# Patient Record
Sex: Male | Born: 1945
Health system: Southern US, Community
[De-identification: ages and names within clinical notes are randomized; demographics above are authoritative.]

## PROBLEM LIST (undated history)

## (undated) DIAGNOSIS — K2100 Gastro-esophageal reflux disease with esophagitis, without bleeding: Secondary | ICD-10-CM

## (undated) DIAGNOSIS — I25709 Atherosclerosis of coronary artery bypass graft(s), unspecified, with unspecified angina pectoris: Secondary | ICD-10-CM

## (undated) DIAGNOSIS — Z9289 Personal history of other medical treatment: Secondary | ICD-10-CM

## (undated) DIAGNOSIS — M792 Neuralgia and neuritis, unspecified: Secondary | ICD-10-CM

## (undated) DIAGNOSIS — G4730 Sleep apnea, unspecified: Secondary | ICD-10-CM

## (undated) DIAGNOSIS — Z8719 Personal history of other diseases of the digestive system: Secondary | ICD-10-CM

## (undated) DIAGNOSIS — I480 Paroxysmal atrial fibrillation: Secondary | ICD-10-CM

## (undated) DIAGNOSIS — D649 Anemia, unspecified: Secondary | ICD-10-CM

## (undated) DIAGNOSIS — Z973 Presence of spectacles and contact lenses: Secondary | ICD-10-CM

## (undated) DIAGNOSIS — K219 Gastro-esophageal reflux disease without esophagitis: Secondary | ICD-10-CM

## (undated) DIAGNOSIS — J189 Pneumonia, unspecified organism: Secondary | ICD-10-CM

## (undated) DIAGNOSIS — K589 Irritable bowel syndrome without diarrhea: Secondary | ICD-10-CM

## (undated) DIAGNOSIS — I6529 Occlusion and stenosis of unspecified carotid artery: Secondary | ICD-10-CM

## (undated) DIAGNOSIS — R6250 Unspecified lack of expected normal physiological development in childhood: Secondary | ICD-10-CM

## (undated) DIAGNOSIS — I219 Acute myocardial infarction, unspecified: Secondary | ICD-10-CM

## (undated) DIAGNOSIS — I739 Peripheral vascular disease, unspecified: Secondary | ICD-10-CM

## (undated) DIAGNOSIS — N189 Chronic kidney disease, unspecified: Secondary | ICD-10-CM

## (undated) DIAGNOSIS — G629 Polyneuropathy, unspecified: Secondary | ICD-10-CM

## (undated) DIAGNOSIS — I639 Cerebral infarction, unspecified: Secondary | ICD-10-CM

## (undated) DIAGNOSIS — I2510 Atherosclerotic heart disease of native coronary artery without angina pectoris: Secondary | ICD-10-CM

## (undated) DIAGNOSIS — K5792 Diverticulitis of intestine, part unspecified, without perforation or abscess without bleeding: Secondary | ICD-10-CM

## (undated) DIAGNOSIS — M1990 Unspecified osteoarthritis, unspecified site: Secondary | ICD-10-CM

## (undated) DIAGNOSIS — E785 Hyperlipidemia, unspecified: Secondary | ICD-10-CM

## (undated) DIAGNOSIS — I10 Essential (primary) hypertension: Secondary | ICD-10-CM

## (undated) DIAGNOSIS — M810 Age-related osteoporosis without current pathological fracture: Secondary | ICD-10-CM

## (undated) HISTORY — PX: CATARACT EXTRACTION W/ INTRAOCULAR LENS IMPLANT: SHX1309

## (undated) HISTORY — DX: Neuralgia and neuritis, unspecified: M79.2

## (undated) HISTORY — DX: Polyneuropathy, unspecified: G62.9

## (undated) HISTORY — DX: Atherosclerosis of coronary artery bypass graft(s), unspecified, with unspecified angina pectoris: I25.709

## (undated) HISTORY — DX: Personal history of other medical treatment: Z92.89

## (undated) HISTORY — DX: Unspecified osteoarthritis, unspecified site: M19.90

## (undated) HISTORY — DX: Anemia, unspecified: D64.9

## (undated) HISTORY — DX: Paroxysmal atrial fibrillation: I48.0

## (undated) HISTORY — DX: Gastro-esophageal reflux disease with esophagitis, without bleeding: K21.00

## (undated) HISTORY — DX: Acute myocardial infarction, unspecified: I21.9

## (undated) HISTORY — DX: Essential (primary) hypertension: I10

## (undated) HISTORY — DX: Unspecified lack of expected normal physiological development in childhood: R62.50

## (undated) HISTORY — DX: Peripheral vascular disease, unspecified: I73.9

## (undated) HISTORY — DX: Occlusion and stenosis of unspecified carotid artery: I65.29

## (undated) HISTORY — PX: ILIAC ARTERY STENT: SHX1786

## (undated) HISTORY — DX: Cerebral infarction, unspecified: I63.9

## (undated) HISTORY — PX: TONSILLECTOMY: SUR1361

## (undated) HISTORY — DX: Age-related osteoporosis without current pathological fracture: M81.0

## (undated) HISTORY — DX: Hyperlipidemia, unspecified: E78.5

## (undated) HISTORY — PX: COLONOSCOPY W/ BIOPSIES AND POLYPECTOMY: SHX1376

## (undated) HISTORY — DX: Gastro-esophageal reflux disease with esophagitis: K21.0

## (undated) HISTORY — PX: CAROTID ENDARTERECTOMY: SUR193

---

## 1985-08-11 DIAGNOSIS — I639 Cerebral infarction, unspecified: Secondary | ICD-10-CM

## 1985-08-11 HISTORY — DX: Cerebral infarction, unspecified: I63.9

## 1996-08-11 HISTORY — PX: PR VEIN BYPASS GRAFT,AORTO-FEM-POP: 35551

## 1999-07-12 ENCOUNTER — Emergency Department (HOSPITAL_COMMUNITY): Admission: EM | Admit: 1999-07-12 | Discharge: 1999-07-13 | Payer: Self-pay | Admitting: Internal Medicine

## 2000-02-29 ENCOUNTER — Emergency Department (HOSPITAL_COMMUNITY): Admission: EM | Admit: 2000-02-29 | Discharge: 2000-02-29 | Payer: Self-pay | Admitting: *Deleted

## 2000-02-29 ENCOUNTER — Encounter: Payer: Self-pay | Admitting: *Deleted

## 2000-03-08 ENCOUNTER — Encounter (INDEPENDENT_AMBULATORY_CARE_PROVIDER_SITE_OTHER): Payer: Self-pay

## 2000-03-08 ENCOUNTER — Encounter: Payer: Self-pay | Admitting: Emergency Medicine

## 2000-03-09 ENCOUNTER — Encounter: Payer: Self-pay | Admitting: Neurology

## 2000-03-09 ENCOUNTER — Inpatient Hospital Stay: Admission: EM | Admit: 2000-03-09 | Discharge: 2000-03-11 | Payer: Self-pay | Admitting: Emergency Medicine

## 2003-08-04 ENCOUNTER — Emergency Department (HOSPITAL_COMMUNITY): Admission: EM | Admit: 2003-08-04 | Discharge: 2003-08-04 | Payer: Self-pay | Admitting: Emergency Medicine

## 2004-12-03 ENCOUNTER — Ambulatory Visit (HOSPITAL_COMMUNITY): Admission: RE | Admit: 2004-12-03 | Discharge: 2004-12-03 | Payer: Self-pay | Admitting: Gastroenterology

## 2004-12-03 ENCOUNTER — Encounter (INDEPENDENT_AMBULATORY_CARE_PROVIDER_SITE_OTHER): Payer: Self-pay | Admitting: *Deleted

## 2007-11-12 ENCOUNTER — Ambulatory Visit: Payer: Self-pay | Admitting: Vascular Surgery

## 2008-05-19 ENCOUNTER — Ambulatory Visit: Payer: Self-pay | Admitting: Vascular Surgery

## 2008-12-14 ENCOUNTER — Ambulatory Visit: Payer: Self-pay | Admitting: Vascular Surgery

## 2009-06-01 ENCOUNTER — Encounter: Admission: RE | Admit: 2009-06-01 | Discharge: 2009-06-01 | Payer: Self-pay | Admitting: Family Medicine

## 2009-07-20 ENCOUNTER — Ambulatory Visit: Payer: Self-pay | Admitting: Vascular Surgery

## 2009-08-01 ENCOUNTER — Emergency Department (HOSPITAL_COMMUNITY): Admission: EM | Admit: 2009-08-01 | Discharge: 2009-08-01 | Payer: Self-pay | Admitting: Emergency Medicine

## 2010-08-22 ENCOUNTER — Ambulatory Visit
Admission: RE | Admit: 2010-08-22 | Discharge: 2010-08-22 | Payer: Self-pay | Source: Home / Self Care | Attending: Vascular Surgery | Admitting: Vascular Surgery

## 2010-08-27 ENCOUNTER — Ambulatory Visit
Admission: RE | Admit: 2010-08-27 | Discharge: 2010-08-27 | Payer: Self-pay | Source: Home / Self Care | Attending: Vascular Surgery | Admitting: Vascular Surgery

## 2010-09-01 ENCOUNTER — Encounter: Payer: Self-pay | Admitting: Family Medicine

## 2010-09-23 ENCOUNTER — Other Ambulatory Visit: Payer: Self-pay | Admitting: Otolaryngology

## 2010-09-23 DIAGNOSIS — R52 Pain, unspecified: Secondary | ICD-10-CM

## 2010-09-24 ENCOUNTER — Ambulatory Visit
Admission: RE | Admit: 2010-09-24 | Discharge: 2010-09-24 | Disposition: A | Discharge status: Home / Self Care | Payer: Medicare Other | Source: Ambulatory Visit | Attending: Otolaryngology | Admitting: Otolaryngology

## 2010-09-24 DIAGNOSIS — R52 Pain, unspecified: Secondary | ICD-10-CM

## 2010-09-24 MED ORDER — IOHEXOL 300 MG/ML  SOLN
75.0000 mL | Freq: Once | INTRAMUSCULAR | Status: AC | PRN
  Administered 2010-09-24: 75 mL via INTRAVENOUS

## 2010-11-11 LAB — BASIC METABOLIC PANEL
BUN: 19 mg/dL (ref 6–23)
CO2: 27 mEq/L (ref 19–32)
Calcium: 9.3 mg/dL (ref 8.4–10.5)
Chloride: 101 mEq/L (ref 96–112)
Creatinine, Ser: 1.04 mg/dL (ref 0.4–1.5)
GFR calc non Af Amer: >60 mL/min (ref >60)
Glucose, Bld: 136 mg/dL — ABNORMAL HIGH (ref 70–99)
Potassium: 4.4 mEq/L (ref 3.5–5.1)
Sodium: 136 mEq/L (ref 135–145)

## 2010-11-11 LAB — URINALYSIS, ROUTINE W REFLEX MICROSCOPIC
Bilirubin Urine: NEGATIVE
Glucose, UA: NEGATIVE mg/dL
Hgb urine dipstick: NEGATIVE
Ketones, ur: NEGATIVE mg/dL
Nitrite: NEGATIVE
Protein, ur: NEGATIVE mg/dL
Specific Gravity, Urine: 1.019 (ref 1.005–1.030)
Urobilinogen, UA: 0.2 mg/dL (ref 0.0–1.0)
pH: 6 (ref 5.0–8.0)

## 2010-11-11 LAB — DIFFERENTIAL
Basophils Absolute: 0 10*3/uL (ref 0.0–0.1)
Basophils Relative: 0 % (ref 0–1)
Eosinophils Absolute: 0.2 10*3/uL (ref 0.0–0.7)
Eosinophils Relative: 3 % (ref 0–5)
Lymphocytes Relative: 24 % (ref 12–46)
Lymphs Abs: 1.5 10*3/uL (ref 0.7–4.0)
Monocytes Absolute: 0.6 10*3/uL (ref 0.1–1.0)
Monocytes Relative: 10 % (ref 3–12)
Neutro Abs: 3.8 10*3/uL (ref 1.7–7.7)
Neutrophils Relative %: 63 % (ref 43–77)

## 2010-11-11 LAB — PROTIME-INR
INR: 0.81 (ref 0.00–1.49)
Prothrombin Time: 11.1 seconds — ABNORMAL LOW (ref 11.6–15.2)

## 2010-11-11 LAB — CBC
HCT: 37 % — ABNORMAL LOW (ref 39.0–52.0)
Hemoglobin: 12.5 g/dL — ABNORMAL LOW (ref 13.0–17.0)
MCHC: 33.7 g/dL (ref 30.0–36.0)
MCV: 98.9 fL (ref 78.0–100.0)
Platelets: 280 10*3/uL (ref 150–400)
RBC: 3.73 MIL/uL — ABNORMAL LOW (ref 4.22–5.81)
RDW: 13.5 % (ref 11.5–15.5)
WBC: 6.1 10*3/uL (ref 4.0–10.5)

## 2010-11-11 LAB — GLUCOSE, CAPILLARY: Glucose-Capillary: 154 mg/dL — ABNORMAL HIGH (ref 70–99)

## 2010-11-11 LAB — APTT: aPTT: 24 seconds (ref 24–37)

## 2010-12-06 ENCOUNTER — Other Ambulatory Visit: Payer: Self-pay | Admitting: Gastroenterology

## 2010-12-24 NOTE — Procedures (Signed)
CAROTID DUPLEX EXAM   INDICATION:  Follow up carotid artery disease.   HISTORY:  Diabetes:  Yes.  Cardiac:  No.  Hypertension:  Yes.  Smoking:  Previous.  Previous Surgery:  Redo of left carotid endarterectomy in 2001.  CV History:  Amaurosis Fugax No, Paresthesias No, Hemiparesis No.                                       RIGHT             LEFT  Brachial systolic pressure:         146               150  Brachial Doppler waveforms:         Normal            Normal  Vertebral direction of flow:        Antegrade         Antegrade  DUPLEX VELOCITIES (cm/sec)  CCA peak systolic                   75                [id number]  ECA peak systolic                   187               Not visualized  ICA peak systolic                   190               87  ICA end diastolic                   60                29  PLAQUE MORPHOLOGY:                  Mixed             Calcific  PLAQUE AMOUNT:                      Moderate          Mild/moderate  PLAQUE LOCATION:                    ICA/ECA           Bulb area   IMPRESSION:  1. High-end 40-59% stenosis of the right internal carotid artery.  2. 1-39% stenosis of the left proximal internal carotid artery/bulb      area.  3. The left external carotid artery was not adequately visualized.  4. No significant change noted when compared to the previous      examination on 11/12/07.   ___________________________________________  Rosetta Posner, M.D.   CH/MEDQ  D:  05/19/2008  T:  05/19/2008  Job:  [id number]

## 2010-12-24 NOTE — Procedures (Signed)
CAROTID DUPLEX EXAM   INDICATION:  Followup known carotid artery disease.   HISTORY:  Diabetes:  Yes.  Cardiac:  No.  Hypertension:  Yes.  Smoking:  Quit about two years ago.  Previous Surgery:  Redo of left carotid endarterectomy in 2001.  CV History:  Amaurosis Fugax No, Paresthesias No, Hemiparesis No                                       RIGHT             LEFT  Brachial systolic pressure:         135               139  Brachial Doppler waveforms:         Biphasic          Biphasic  Vertebral direction of flow:        Antegrade         Antegrade  DUPLEX VELOCITIES (cm/sec)  CCA peak systolic                   81                82  ECA peak systolic                   251               Not well  visualized  ICA peak systolic                   194               45  ICA end diastolic                   58                17  PLAQUE MORPHOLOGY:                  Heterogenous      None  PLAQUE AMOUNT:                      Moderate          None  PLAQUE LOCATION:                    ICA, ECA          None   IMPRESSION:  1. 40-59% stenosis noted in the right internal carotid artery.  2. Normal carotid duplex noted in the left internal carotid artery,      status post left carotid endarterectomy two times.  3. Antegrade bilateral vertebral arteries.   ___________________________________________  Rosetta Posner, M.D.   MG/MEDQ  D:  11/12/2007  T:  11/12/2007  Job:  [id number]

## 2010-12-24 NOTE — Assessment & Plan Note (Signed)
OFFICE VISIT   AKI, DIBELLA  DOB:  August 20, 1945                                       08/27/2010  [id number]   Patient presents today for continued discussion of his diffuse  peripheral vascular occlusive disease.  He is well-known to me, dating  back to a prior left carotid endarterectomy in 2004.  He had subsequent  recurrent stenosis and underwent re-do left carotid endarterectomy in  2001.  He is also status post aortobifemoral bypass for severe  aortoiliac occlusive disease.  He fortunately has remained asymptomatic  and is actually doing quite well from a health standpoint.  He quit  smoking and has lost approximately 30 pounds.  He followed up in our  noninvasive vascular laboratory study, and we are discussing his recent  noninvasive laboratory studies with him.  He denies any neurologic  deficits.  He is diabetic.  He does have elevated cholesterol.  He is  retired.   PHYSICAL EXAMINATION:  A well-developed and well-nourished white male  appearing his stated age in no acute distress.  He is neurologically  intact.  He does not have carotid bruits.  He has a well-healed left  neck incision and normal carotid pulses.  He has 2+ radial pulses  bilaterally.   I have reviewed his carotid duplex with him from 08/22/10.  This does  show low end of moderate-to-severe stenosis in his right carotid in the  60% to 79% range with no significant change since his last study.  He  does have an ulcerative eccentric plaque in the left common carotid  artery on the duplex.  There is no flow limitation.   I discussed this at length with patient and his wife present.  I  explained that we not recommend anything other than continued  observation and again explained some hemispheric symptoms.  He knows to  notify us should this occur, otherwise we will see him again in 6 months  with repeat carotid duplex.     Rosetta Posner, M.D.  Electronically  Signed   TFE/MEDQ  D:  08/27/2010  T:  08/28/2010  Job:  [id number]   cc:   C. Melinda Crutch, M.D.

## 2010-12-24 NOTE — Assessment & Plan Note (Signed)
OFFICE VISIT   TALUS, SCARFO T  DOB:  05-May-1946                                       11/12/2007  [id number]   Cal Lisowski presents today for continued followup of his diffuse  peripheral vascular occlusive disease.  He has multiple complaints.  He  reports that he is concerned regarding all of these issues and that he  may have some significant risk from a  vascular standpoint.  He reports  progressive shortness of breath with and without exertion.  He has a  chronic right-sided headache.  He has burning at the bottom of both  feet.  He has numbness in his right arm that extends down mainly into  his right thumb and this is constant for 3-4 months and also has  numbness and burning sensation over his right anterior hip and thigh,  this occurs mostly with sitting or riding in a car.  He also reports  that he is having increasingly difficult control of blood sugar.  He is  not on insulin, is on Actos, glimepiride and metformin.  He reports that  he has been running in the 200-250 range.  He denies any specific focal  neurologic deficits.  On questioning, he does not walk a great deal but  does have some mild nonlimiting right calf claudication if he pushes  things.  He is continuing to be treated for blood pressure control.   SOCIAL HISTORY:  Unchanged.  He is married with 2 children.  He does not  smoke or drink alcohol.   REVIEW OF SYSTEMS:  Positive for the above, also reflux and occasional  diarrhea.   MEDICATIONS:  His only medication is to penicillin.   MEDICATIONS:  Are as described above plus Lotrel, Lipitor, Prevacid,  Niaspan, Plavix, aspirin, Vitamin E and multivitamin.   PHYSICAL EXAM:  A well-developed, well-nourished white male appearing  stated age 65, blood pressure is 142/75, heart rate is 103, respirations  18.  His left carotid incision is well healed.  He has no bruits  bilaterally.  He has 2+ radial pulses.  He does have  2+ femoral pulses  and absent popliteal and distal pulses.  Heart:  Regular rate and  rhythm.  Chest:  Clear bilaterally.  He underwent repeat carotid and  lower extremity arterial studies today in our office and I reviewed  these with Mr. Reidy and his wife present.  His carotid duplex reveals  no evidence of restenosis in his prior endarterectomy site on the left.  He does have no change in his moderate chronic stenosis in the right  internal carotid artery.  His ankle-arm index is slightly down on the  right at 0.47 vs. 0.67 at his last visit 2 years ago.  On the right, his  ankle-arm index is  0.59.  I discussed this again with Mr. Yongue and do  not see any evidence of progressive arterial disease.  We will continue  to see him on a yearly basis with carotid duplex to rule out a  progression in his asymptomatic right carotid stenosis.  He will notify  us should he develop any difficulties in the interim.   Rosetta Posner, M.D.  Electronically Signed   TFE/MEDQ  D:  11/12/2007  T:  11/15/2007  Job:  [id number]   cc:   C. Melinda Crutch, M.D.

## 2010-12-24 NOTE — Assessment & Plan Note (Signed)
OFFICE VISIT   Bradley [name], Bradley [name]  DOB:  [birth year]                                       07/20/2009  [id number]   The patient presents today for evaluation of his diffuse peripheral  vascular occlusive disease.  He is well-known to me from prior left  carotid endarterectomy in 1994 and also aortobifemoral bypass graft at  the same time.  He did have a recurrent stenosis and underwent redo left  endarterectomy in 2001.  He has done well since that time.  He reports  now that he has had a headache for over 3 months.  He has had an  extensive evaluation to include  potential arteritis and has been on  prednisone therapy with minimal improvement.  Also is now on Neurontin  with some slight improvement.  He reports that it is a constant and  right-sided headache.  He is also seen at [facility] in [facility] for headache evaluation as well.  His headache  is continuous and does not have any relieving factors.  He has had no  focal neurologic deficits, specifically amaurosis fugax,  transient  ischemic attack or stroke.   SOCIAL HISTORY:  Unchanged.  He quit smoking 6 weeks ago.  Does not  drink alcohol on a regular basis.  He is married with 2 children.   Does have history of premature atherosclerotic disease in his father.   REVIEW OF SYSTEMS:  Positive for short of breath on exertion, esophageal  reflux, headache, arthritic joint pain.   PHYSICAL EXAMINATION:  Well-developed, well-nourished white male  appearing stated age of 82.  His carotid arteries are without bruits  bilaterally.  He does have a well-healed left neck incision.  His radial  pulses are 2+.  I do not palpate any neck masses.  His blood pressure  today is 142/85 in his right arm, 152/84 in his left arm.  Heart rate  90, temperature is 98.0.  HEENT:  Pupils are equal, round and reactive  to light.  Extraocular muscles are intact.  He has no scalp tenderness.  He  has no lymphadenopathy in his neck.  Chest:  Clear bilaterally.  Heart:  Regular rate and rhythm.  Abdomen:  Shows moderate obesity, no  masses and no tenderness. Musculoskeletal:  No major deformities or  cyanosis.  He does have palpable femoral pulses and absent popliteal and  distal pulses.  He has no focal neurologic deficits.   I reviewed his ultrasound from today with the patient which shows no  change in his left carotid endarterectomy site with a widely patent  endarterectomy and no evidence of stenosis.  He does have moderate 60%  to 79% stenosis in the right internal carotid artery and this is no  significant change from his last study 1 year ago.  His lower extremity  duplex reveal monophasic wave forms in his feet bilaterally with an  ankle arm index of 0.59 on the right and 0.67 on the left.  I discussed  this at length with the patient and his wife present.  I do not see any  indication that extracranial cerebrovascular occlusive disease causing  his headache.  I have recommend that we continue to see him on yearly  basis to rule out progression of his moderate asymptomatic disease on  the right.  I also  explained the significant claudication and explained  that this is related, in all likelihood, to superficial femoral artery  occlusive disease and should be stable and would recommend treatment  only for progressive claudication symptoms.  He will see [place] again in one  year.   Rosetta Posner, M.D.  Electronically Signed   TFE/MEDQ  D:  07/20/2009  T:  07/23/2009  Job:  [id number]   cc:   C. Melinda Crutch, M.D.  Attn:  [facility]

## 2010-12-24 NOTE — Procedures (Signed)
CAROTID DUPLEX EXAM   INDICATION:  Carotid disease.   HISTORY:  Diabetes:  Yes.  Cardiac:  No.  Hypertension:  Yes.  Smoking:  Previous.  Previous Surgery:  Redo of left carotid endarterectomy in 2001.  CV History:  Headaches for 3 months.  Amaurosis Fugax No, Paresthesias No, Hemiparesis No                                       RIGHT             LEFT  Brachial systolic pressure:         139               146  Brachial Doppler waveforms:         Normal            Normal  Vertebral direction of flow:        Antegrade         Antegrade  DUPLEX VELOCITIES (cm/sec)  CCA peak systolic                   88                [id number]  ECA peak systolic                   194               75  ICA peak systolic                   211               76  ICA end diastolic                   63                27  PLAQUE MORPHOLOGY:                  Mixed             Mixed  PLAQUE AMOUNT:                      Moderate / severe Mild  PLAQUE LOCATION:                    ICA/ECA/CCA       ICA/CCA   IMPRESSION:  1. 40%-59% stenosis of the right internal carotid artery.  2. Patent left carotid endarterectomy site with 1%-39% stenosis of the      left internal carotid artery.  3. No significant change in the Doppler velocities noted when compared      to the previous exam on 05/19/2008.   ___________________________________________  Rosetta Posner, M.D.   CH/MEDQ  D:  07/20/2009  T:  07/20/2009  Job:  [id number]

## 2010-12-24 NOTE — Procedures (Signed)
CAROTID DUPLEX EXAM   INDICATION:  Follow up right carotid disease, left CEA.   HISTORY:  Diabetes:  Yes.  Cardiac:  No.  Hypertension:  Yes.  Smoking:  Quit.  Previous Surgery:  Aorto-bifem graft in 1994, left CEA in 1994 with re-  do in 2001.  CV History:  Continued dysphagia since 2010.  Amaurosis Fugax No, Paresthesias No, Hemiparesis No.                                       RIGHT             LEFT  Brachial systolic pressure:         128               124  Brachial Doppler waveforms:         WNL               WNL  Vertebral direction of flow:        Antegrade         Antegrade  DUPLEX VELOCITIES (cm/sec)  CCA peak systolic                   89                [id number]  ECA peak systolic                   207               Not visualized  ICA peak systolic                   198               65  ICA end diastolic                   65                24  PLAQUE MORPHOLOGY:                  Heterogenous      Question  ulcerative  PLAQUE AMOUNT:                      Mild to moderate  Mild  PLAQUE LOCATION:                    ICA               CEA start point   IMPRESSION:  1. 60% to 79% (low end) right internal carotid artery stenosis.  While      the criteria category has changed, the velocities remain consistent      with prior study of 07/2009.  2. Possible ulcerative plaque observed in the left common carotid      artery at the carotid endarterectomy start point.  Mildly increased      velocities are observed.  3. Patent remainder of left carotid endarterectomy/internal carotid      artery.  4. Left external carotid artery could not  be visualized.  5. Antegrade bilateral vertebral arteries.   Results communicated to Dr. Oneida Alar on 08/22/2010.   ___________________________________________  Rosetta Posner, M.D.   LT/MEDQ  D:  08/22/2010  T:  08/22/2010  Job:  [id number]

## 2010-12-27 NOTE — Discharge Summary (Signed)
[facility]  Patient:    Bradley [name], Bradley [name]                      MRN: [id number] Adm. Date:  [id number] Disc. Date: [id number] Attending:  Meredith Leeds Dictator:   Marcellus Scott, P.A. CC:         Rosetta Posner, M.D.  Alyson Locket. Love, M.D.  Gus Height, M.D.   Discharge Summary  DATE OF BIRTH:  [birth year].  SURGEON:  Dr. Arvilla Meres. Early.  NEUROLOGIST:  Dr. Alyson Locket. Erling Cruz.  FINAL DIAGNOSES 1. History of extracranial cerebrovascular occlusive disease, status post left    carotid endarterectomy in 1994. 2. History of reversible ischemic neurologic defect, right hemispheric, in    1987. 3. This admission, recurrent amaurosis fugax, left eye, new onset; also,    right-sided weakness and slurred speech.  SECONDARY DIAGNOSES 1. Aortoiliac occlusive disease, status post aortobifemoral bypass, 1998. 2. History of long-term current tobacco habituation. 3. Type 2 diabetes mellitus. 4. Strong family history of premature atherosclerotic disease.  PROCEDURES 1. March 09, 2000, carotid Duplex ultrasound demonstrating on the right, a    40-60% right internal carotid artery stenosis; on the left, greater than    80% internal carotid artery stenosis, which has become symptomatic. 2. March 10, 2000, redo left carotid endarterectomy with resection of redundant    left internal carotid artery primary anastomosis Dacron patch angioplasty,    intraoperative shunt, Dr. Sherren Mocha Early.  DISCHARGE DISPOSITION:  Bradley [name] is judged a suitable candidate for discharge on postoperative day #1.  His neurologic status in the postoperative period was at baseline.  He had no difficulty swallowing.  His uvula was midline.  He had good motor strength and coordination in both upper and lower extremities.  He had no history of nausea postoperatively.  His mental status has remained clear.  He is having no trouble swallowing, taking oral nourishment.  His incision is healing  nicely.  There is no evidence of swelling or erythema.  His pain is controlled with oral analgesia.  He will go home on the following medications.  DISCHARGE MEDICATIONS 1. Percocet 5/325 mg one to two tabs p.o. q.4-6h. p.r.n. pain. 2. Lipitor 20 mg daily. 3. Lotrel 20 mg daily. 4. Plavix 75 mg daily. 5. Enteric aspirin 325 mg daily.  DISCHARGE DIET:  Diabetic diet.  ACTIVITY:  He may ambulate as tolerated.  He is asked not to drive for the next two weeks.  WOUND CARE:  He may shower beginning Thursday, March 12, 2000.  He is to sponge bathe until then.  FOLLOWUP:  As followup, he will see Dr. Donnetta Hutching in two weeks; Dr. Antonietta Barcelona office will call with that appointment.  BRIEF HISTORY:  Bradley [name] is a 65 year old male with a prior history of extracranial cerebrovascular occlusive disease which includes right brain stroke with left-sided weakness for about two days in the past; he also underwent left carotid endarterectomy in 1994.  In the past two weeks, he has had recurrent symptoms of amaurosis fugax in the left eye, occurring about three times daily.  They last from 30 seconds to 2 hours.  He is also complaining of right arm and leg weakness as well as slurred speech.  He presented to the [facility] Room, where he was placed on oxygen and received aspirin.  He was also placed on IV heparin and scheduled for carotid Duplex ultrasound.  HOSPITAL COURSE:  After  admission to [facility] on July 29th with new-onset episodes of left eye amaurosis fugax as well as right-sided weakness and slurred speech, he was placed on heparin.  He was seen in consultation by Dr. Morene Antu, neurologist, who recommended carotid Duplex ultrasound.  This was done the same day and showed greater than 80% left internal carotid artery stenosis with hemodynamically insignificant right internal carotid artery stenosis.  He was also seen in consultation by Dr. Judeth Cornfield. Scot Dock and Dr. Sherren Mocha Early and surgery was planned for July 31st.  The procedure involved a redo left carotid endarterectomy with resection of redundant left internal carotid artery segment with primary anastomosis to rejoin the internal carotid artery to the common carotid artery.  Dacron patch angioplasty was performed with intraoperative shunt throughout the procedure.  Dr. Sherren Mocha Early was the surgeon.  The patient tolerated the procedure well and awoke in the recovery room with baseline neurologic status.  His hospital course has lasted one day and is essentially the same as in discharge disposition.  He is at baseline neurologic status. He has not been confused postoperatively.  He is taking oral nourishment.  His pain is well-controlled with oral analgesia.  He is ambulating independently. He does not require oxygen supplementation.  His wounds are healing nicely without evidence of erythema or drainage or swelling.  He goes home on the medications and followup as dictated above. DD:  03/10/00 TD:  03/11/00 Job: [id number]

## 2010-12-27 NOTE — Consult Note (Signed)
[facility]  Patient:    Bradley [name], Bradley [name]                      MRN: [id number] Proc. Date: 02/29/00 Adm. Date:  [id number] Disc. Date: [id number] Attending:  Molpus, John L CC:         Charles A. Harrington Challenger, M.D.                          Consultation Report  REFERRING PHYSICIAN:  [facility], C. Gus Height, M.D.  CHIEF COMPLAINT:  Left eye visual disturbance.  HISTORY OF PRESENT ILLNESS:   Bradley [name] is a 65 year old, right-handed, divorced male. Over the past week, he has been complaining of fluctuating visual symptoms primarily involving his left eye. Objects seemed blurred to him. It is difficult for him to describe actually what is happening with his vision. He denies double vision but states he has trouble with near vision. He went to an optometrist this past week who could find nothing wrong according to what the patient tells me. Apparently, the optometrist expressed some concern regarding his intraocular pressure or at least wanted the patient to return to measure the pressure and possibly obtain visual field.  This morning, the patients vision bothered him again, and he went to the [facility]. The physician on duty thought that she detected a left superior quadrantic visual field defect. The patient was referred to neurology.  The patients history is notable for a right brain stroke in 1987. He has a history of visual disturbance in the early 1990s, again with rather vague symptoms and ended up with a left carotid endarterectomy by Rosetta Posner, M.D.  PAST MEDICAL HISTORY:  Reveals the patient is status post aortofemoral bypass procedure, as well as left carotid endarterectomy, and has history of right brain stroke.  CURRENT MEDICATIONS:  Lipitor, Lotrel, and Plavix. He has been taking one aspirin per day in the past week.  ALLERGIES:  The patient notes an allergy to  PENICILLIN.  FAMILY HISTORY:  Positive for strokes in his father who apparently died at age 37. Mother is deceased with pancreatic cancer. One sister is healthy.  SOCIAL HISTORY:  The patient is divorced and has two children. 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 He smokes off and on. Has not used alcohol in the past 10 years.  REVIEW OF SYSTEMS:  Otherwise negative.  PHYSICAL EXAMINATION:  GENERAL:  This is a well-developed, well-nourished, pleasant gentleman in no acute distress.  VITAL SIGNS:  Blood pressure 143/88, pulse 76 and regular, respirations 16, temperature afebrile.  HEENT:  Cranium is normocephalic, atraumatic. The oropharynx is benign.  NECK:  Supple without bruits.  HEART:  Reveals a regular rate and rhythm without murmurs.  LUNGS:  Clear to auscultation.  EXTREMITIES:  Without clubbing, cyanosis, or edema.  NEUROLOGICAL:  The patient is alert and oriented. His speech is hesitant at times. He has difficulty describing his visual problems. Cranial nerve examination reveals full extraocular movements without nystagmus. Pupils are equal and reactive to light. I question a slight left afferent pupillary defect. He had some trouble counting fingers in his left superior visual quadrant, only in the left eye. Visual acuity is 20/25 on the right, 20/30 in the left eye at near vision. Funduscopic examination reveals no obvious problems. There is mild left facial droop related to his previous right brain  stroke. The tongue protrudes in the midline. There is slight decrease in sensation over the left side of the face again which is chronic. Motor testing reveals negative ______ . There is 5/5 strength throughout the upper and lower extremities. Deep tendon reflexes are 1 and 2+ and symmetric. Plantar reflexes are downgoing bilaterally. Sensory examination is intact to pinprick, temperature, vibratory sensation throughout. Cerebellar testing reveals  good rapid alternating movements in finger-to-nose. Gait was not tested.  LABORATORY DATA:  CT scan of the brain without contrast shows an old right parietal CVA. Radiologist was concerned about possible early swelling in the left brain with early effacement of the sulci, but I do not feel that this is present or pertinent.  Comprehensive metabolic panel was normal, except for glucose of 135. CBC was normal.  IMPRESSION:  Subjective visual disturbance in the left eye. Rule out retinal embolic disease, rule out glaucoma.  PLAN:  Continue aspirin and Plavix. The patient will be referred for carotid Doppler studies early next week to Dr. Delorse Lek office. I have recommended an ophthalmologic consultation with a retinal specialist and consideration of visual field testing. The patient was instructed to call our office or the doctor on call should he have further problems with his vision. DD:  02/29/00 TD:  03/02/00 Job: [id number]

## 2010-12-27 NOTE — Consult Note (Signed)
[facility]  Patient:    Bradley [name]                       MRN: [id number] Proc. Date: 03/09/00 Adm. Date:  [id number] Attending:  Meredith Leeds CC:         Rosetta Posner, M.D.                          Consultation Report  REASON FOR CONSULTATION:  Right-sided weakness and expressive aphasia in addition to left facial droop.  HISTORY OF PRESENT ILLNESS:  This is a 65 year old gentleman well known to Dr. Donnetta Hutching, who had actually seen him in the office on July 26.  He had undergone previous left carotid endarterectomy in 1994.  This was an endarterectomy with Dacron patch angioplasty.  He had done well from that standpoint.  He recently had reported multiple episodes of blurred vision in his left eye that would come and go and was usually associated with driving his car.  He had not had any hemispheric symptoms.  He was evaluated by Dr. Reginal Lutes.  He underwent a CT scan, which revealed some old strokes but no acute events.  He also apparently was evaluated by an ophthalmologist with no evidence of intraocular cause for his visual changes.  Carotid duplex scan in the office had shown a tight greater than 90% recurrent left carotid stenosis with a very mild right carotid stenosis.  Given the very tight recurrent left carotid stenosis, he was actually scheduled for redo left carotid endarterectomy on July 31.  The patient called tonight and described some bruising on the medial aspect of his left eye.  He denied any focal neurologic symptoms.  I did not think this initially was related to his carotid; however, as we were speaking, he stated that he had suddenly developed a facial droop on the left.  He was instructed to come urgently to the emergency department, and I notified them to notify me of his admission.  When I saw him in the ER, he actually had first been evaluated by Dr. Erling Cruz, who is admitting him and placed him on heparin.  The  patient states that during the car ride into the hospital, he had developed an intermittent episode of weakness abducting the right and leg, which had resolved spontaneously.  In addition, according to the family, he had some problems with expressive aphasia, which have also resolved.  He denied any history of amaurosis fugax.  PAST MEDICAL HISTORY: 1. A right hemispheric stroke in 1987. 2. He has had a previous left carotid endarterectomy in May 1994.  This was    done with a Dacron patch angioplasty. 3. He has undergone aortofemoral bypass grafting in 1998, also by Dr. Donnetta Hutching. 4. He also has a history of coronary artery disease and has had a silent MI    in the past. 5. He does also have a history of glucose intolerance. 6. He has undergone a previous right iliac stent in the past.  SOCIAL HISTORY:  He is divorced and has two children.  He smokes one to three packs per day and has been smoking for 33 years.  FAMILY HISTORY:  His mother died with pancreatic cancer.  His father died with a stroke at age 31.  REVIEW OF SYSTEMS:  He did have pneumonia as a child.  He has undergone colonoscopy in 1998 to work up GI bleeding.  His review of systems is otherwise negative.  ALLERGIES:  PENICILLIN.  MEDICATIONS:  Plavix, Lipitor, Lotrel.  His Plavix was stopped on the 26th, and he was started on aspirin.  PHYSICAL EXAMINATION:  VITAL SIGNS:  His heart rate is 94 with a normal rhythm.  Blood pressure is 160/80.  HEENT:  He has a small amount of ecchymosis at the medial aspect of his left eye.  He has a very minimal left-sided facial droop.  Motor and sensory function is intact bilaterally with no focal deficits noted.  NECK:  He does have a right carotid bruit.  LUNGS:  Clear bilaterally to auscultation.  CARDIAC:  He has a regular rate and rhythm.  ABDOMEN:  Soft, nontender.  EXTREMITIES:  He has radial and femoral pulses bilaterally.  Feet are warm and well-perfused with no  ischemic ulcerations.  He has no significant lower extremity swelling.  LABORATORY DATA:  CT scan done today at [facility] shows an old right posterior parietal infarct.  There is no evidence of intracranial hemorrhage or acute cortical infarct.  IMPRESSION:  The symptoms the patient has experienced on the ride in could be consistent with left hemispheric transient ischemic attack, given the right-sided weakness and expressive aphasia.  However, the left facial droop could potentially be consistent with right hemispheric transient ischemic attack.  I agree with heparin and follow-up with duplex tomorrow.  If the patient has recurrent symptoms while on heparin, would need to consider proceeding with the carotid endarterectomy on a more urgent basis.  Otherwise, Dr. Donnetta Hutching will likely proceed with the carotid endarterectomy as scheduled on the 31st, pending the results of the duplex scan. DD:  03/09/00 TD:  03/10/00 Job: [id number]

## 2010-12-27 NOTE — H&P (Signed)
[facility]  Patient:    Bradley [name]                       MRN: [id number] Adm. Date:  [id number] Attending:  Meredith Leeds CC:         Gus Height, M.D.             Rosetta Posner, M.D.                         History and Physical  PATIENTS ADDRESS:  [address] [place] [id number]. DATE OF BIRTH:  Jul 03, 1946.  REASON FOR ADMISSION:  This 65 year old right-handed white, divorced male is admitted from the emergency room for evaluation of left eye visual loss and new-onset right-sided weakness with slurred speech.  HISTORY OF PRESENT ILLNESS:  This patient has a 40-year history of hypertension and known type 2 diabetes mellitus, which has been diet-controlled.  He has also had elevated cholesterol, peripheral vascular disease, and chronic cigarette use.  In 1987, he developed left-sided weakness for two days to approximately one week and was admitted to [facility].  He had a left carotid endarterectomy in 1994 and in 1998 had an aortofemoral bypass at [facility] in [place].  Over the last two weeks, he has had recurrent episodes of left eye visual loss lasting from 30 seconds to two hours occurring at a frequency of three times per day and was seen in the emergency room at [facility] by Dr. Reginal Lutes, a neurologist in [place], on February 29, 2000.  CT scan was performed at that time, the results of which were unknown, and on Tuesday, March 03, 2000, he saw Dr. Zadie Rhine, an eye doctor; on Thursday, March 05, 2000, Dr. Curt Jews, a cardiovascular surgeon.  Doppler studies of the carotid were obtained, the results of which are unknown.  Plavix was discontinued, and the patient was scheduled for surgery on March 10, 2000.  He has continued to have left eye visual loss and this evening looked into a mirror and thought he saw some visual changes occurring in the left eye itself.  This sounds almost like a broken blood  vessel.  He did have, however, recurrent left eye visual loss, which was followed by right arm and leg weakness with numbness, and he came to the [facility] Emergency Room.  He received nasal oxygen and aspirin and 200 cc/hr. of normal saline in the emergency room, and his symptomatology improved.  The patient denied any chest pain, palpitations.  His symptoms did clear in the emergency room.  PAST MEDICAL HISTORY:  Significant for hypertension, type 2 diabetes mellitus diet-controlled, right brain stroke in 1987, aortofemoral bypass in September 1998, left carotid endarterectomy in 1994.  His past medical history is otherwise unremarkable.  MEDICATIONS:  His medications had been Plavix 75 mg q.d., which was discontinued on March 05, 2000, Lipitor 20 mg q.d., Lotrel 5/20 mg once per day, and recently he had started one aspirin per day.  SOCIAL HISTORY:  He smokes one pack per day of cigarettes.  He does not drink alcohol.  ALLERGIES:  He has a history of allergy to PENICILLIN.  FAMILY HISTORY:  His mother died at age 49 of pancreatic cancer.  His father died at 10 from stroke and had his first stroke at age 59.  He has one sister, 45, living and well, and two  children, a daughter 41 and a son 62, living and well.  PHYSICAL EXAMINATION:  GENERAL:  He was a well-developed, mildly anxious white male in no acute distress.  VITAL SIGNS:  Blood pressure lying, right and left arm, 160/80, heart rate was 95.  NECK:  There is a right carotid and right and left supraclavicular bruits heard.  Neck was supple.  NEUROLOGIC:  Mental status:  He was alert and oriented x 3.  He followed one, two, and three-step commands.  He could name objects.  Cranial nerve examination with visual fields full.  There is no homonymous hemianopsia. There was no Hollenhorst plaque seen.  Extraocular movements were full. Corneals were present.  Pupil _____ were 5-3 bilaterally.  There was no seventh  nerve palsy.  Tongue was midline.  The uvula was midline.  Gags were present.  Sternocleidomastoid and trapezius testing were normal.  Motor examination with good strength in the upper and lower extremities.  He had fair finger-to-nose and heel-to-shin.  Sensory examination was intact to pinprick, touch, position, and vibration testing.  Deep tendon reflexes were 2+.  Plantar responses were downgoing.  HEENT:  General examination revealed the tympanic membranes to be clear. Mouth was in good repair.  He had no enlargement of the thyroid.  LUNGS:  Clear to auscultation.  HEART:  Examination of the heart revealed no murmurs.  ABDOMEN:  Bowel sounds were normal.  There was no enlargement of the liver, spleen, or kidneys.  GENITOURINARY:  He was circumcised.  RECTAL:  His rectal exam was not performed since not pertinent to the present illness.  EXTREMITIES:  There was no cyanosis, clubbing, or edema in the extremities. He had good dorsalis pedis and posterior tibial pulses.  LABORATORY DATA:  Laboratory data revealed a CT scan showing an old right parietal stroke.  EKG showed normal sinus rhythm and was normal.  Hemoglobin was 14.4, hematocrit 42.1, white blood cell count was 10,700, platelets 320,000.  CK was 85.  Glucose was high at 302, BUN 18, creatinine 1.0, sodium 132, potassium 3.8, chloride 104, CO2 content 26.  IMPRESSION: 1. Left eye amaurosis fugax, code 362.34. 2. Left brain hemispheric transient ischemic attack, code 435.9. 3. Left carotid disease with probable total occlusion, code 433.11. 4. Status post left carotid endarterectomy in 1994, code 433.10. 5. Status post aortofemoral bypass in September 1998, code 440.21. 6. Hypertension, code 796.2. 7. Type 2 diabetes mellitus, code 250.60. 8. Old right brain stroke in 1987, code 434.01.  PLAN:  Place him on heparin protocol and repeat his Doppler studies and admit him to the hospital. DD:  02/11/00 TD:   03/10/00 Job: [id number]

## 2010-12-27 NOTE — Op Note (Signed)
[facility]  Patient:    Bradley, [name]                        MRN: [id number] Proc. Date: 03/10/00 Attending:  Rosetta Posner, M.D. CC:         Alyson Locket. Love, M.D.             Rosetta Posner, M.D.                           Operative Report  PREOPERATIVE DIAGNOSIS:  Symptomatic severe carotid artery restenosis.  POSTOPERATIVE DIAGNOSIS:  Symptomatic severe carotid artery restenosis.  PROCEDURES: 1. Redo left carotid endarterectomy and Dacron patch angioplasty. 2. Resection of redundant internal carotid artery with end to end closure.  SURGEON:  Dr. Sherren Mocha Early.  ASSISTANT:  Marcellus Scott, P.A.-C.  ANESTHESIA:  General endotracheal.  COMPLICATIONS:  None.  DISPOSITION:  To recovery room stable.  DESCRIPTION OF PROCEDURE:  The patient was taken to the operating room and placed in a position where the area of the left neck was prepped and draped in the usual sterile fashion. An incision was made through the prior scar and carried down through the platysma to isolate the common carotid artery. There was an extensive amount of scarring around the artery from the prior patch. The common carotid artery was encircled with an umbilical tape and Rumel tourniquet. The vagus nerve was adherent to the carotid artery and this was gently mobilized off the carotid. Dissection was extended onto the bifurcation. The superior thyroid artery was encircled through a silk Potts tie. The external carotid was encircled with a blue vessiloop and the internal carotid was encircled with an umbilical tape and Rumel tourniquet. There was some kinking at the distal portion of the patch onto the internal carotid artery as well. The patient was given 8000 units of intravenous heparin and after adequate circulation time the internal, external and common carotid arteries were occluded. The common carotid artery was opened with an 11 blade and extended longitudinally through the  patch onto the internal carotid with Potts scissors. A 10 shunt was passed up the internal carotid, allowed to back bleed and then down the common carotid where it was secured with a Rumel tourniquet. The endarterectomy was begun on the common carotid artery and the plaque was divided proximally with Potts scissors. The old patch was removed in its entirety and there was minimal atherosclerotic change or narrowing in the carotid bulb. In the very distal most portion of the plaque, there was a very tight stenosis which appeared to have the appearance of hemorrhage into an atherosclerotic plaque. This was causing at least 90-95% stenosis. This was endarterectomized up onto the carotid above the prior patch and the internal carotid at this level was completely normal. There was moderate redundancy and it appeared that there would be kinking if the internal carotid was not shortened. For this reason, the stay sutures were placed proximally and distally onto the internal carotid artery and approximately 3/4 of a centimeter of internal carotid artery was resected. The internal carotid artery was sewn end-to-end to itself with a running 6-0 Prolene suture. A finesse Hemashield Dacron patch was then brought onto the field and was sewn as a patch angioplasty with a running 6-0 Prolene suture. Prior to completion of the closure, the shunt was removed and the usual flushing maneuvers were undertaken. The anastomosis was then completed.  The external followed by the common following the internal carotid occlusion. The clamp was removed. Excellent flow with hand held Doppler. The wounds were irrigated with saline, hemostasis with electrocautery. The patient was given 15 mg of protamine to reverse the heparin. A 10 flat Jackson-Pratt drain was brought through a separate stab incision at the level of the sternal notch and was placed at the base of the wound. The wound was closed with several interrupted 3-0  Vicryl sutures, reapproximated the sternocleidomastoid over the carotid sheath. Next, the platysma was closed with running 3-0 Vicryl suture and finally the skin was closed with 4-0 subcuticular Vicryl stitch. Benzoin and Steri-Strips were applied. The patient was awakened in the recovery room in stable condition and was transferred to the recovery room neurologically intact. DD:  03/11/00 TD:  03/12/00 Job: [id number]

## 2010-12-27 NOTE — Op Note (Signed)
NAMEWONG, WATSON               ACCOUNT NO.:  [id number]   MEDICAL RECORD NO.:  [id number]          PATIENT TYPE:  AMB   LOCATION:  ENDO                         [facility]   PHYSICIAN:  Jeryl Columbia, M.D.    DATE OF BIRTH:  [birth year]   DATE OF PROCEDURE:  12/03/2004  DATE OF DISCHARGE:                                 OPERATIVE REPORT   PROCEDURE:  Colonoscopy and biopsy.   INDICATIONS:  Screening.  Consent was signed after risks, benefits, methods,  and options were thoroughly discussed in the office.   PREMEDICATIONS:  Demerol 0 mg, Versed 6 mg.   DESCRIPTION OF PROCEDURE:  Rectal inspection pertinent for external  hemorrhoids.  Digital exam was negative.  Video colonoscope was inserted,  easily advanced around the colon to the level of the ileocecal valve.  Unfortunately at that point there was some looping and to advance to the  cecal pole required rolling the patient on his back and then on his right  side.  Cecum was identified by the appendiceal orifice and the ileocecal  valve.  Prep was fairly adequate.  Moderate amount of washing and suctioning  were done to get adequate visualization.  On slow withdrawal through the  colon, cecum, ascending, transverse, majority of the descending were normal.  There was a rare few early left-sided diverticula and in the rectum, a tiny  probable hyperplastic polyp which was cold-biopsied x2.  Anorectal pull-  through and retroflexion confirmed the hemorrhoids.  External greater than  internal.  The scope was straightened and readvanced a short ways up the  left side of the colon.  Air was suctioned and the scope removed.  The  patient tolerated the procedure well.  There were no obvious immediate  complications.   ENDOSCOPIC DIAGNOSES:  1.  Internal and external hemorrhoids.  2.  Rare few early left-sided diverticula.  3.  Tiny rectal polyp, probably hyperplastic cold biopsy.  4.  Otherwise within normal limits to the  cecum.   PLAN:  Await pathology.  Probably recheck in five years.  Happy to see back  p.r.n.  Otherwise return care to Dr. Harrington Challenger for the customary health care  screening and maintenance to include yearly rectals and guaiacs.   Anorectal pull-through      MEM/MEDQ  D:  12/03/2004  T:  12/03/2004  Job:  [id number]   cc:   C. Melinda Crutch, M.D.  [address]  [place]  [place] [address]  Fax: [phone]

## 2011-02-25 ENCOUNTER — Other Ambulatory Visit: Payer: Medicare Other

## 2011-02-25 ENCOUNTER — Ambulatory Visit: Payer: Medicare Other | Admitting: Vascular Surgery

## 2011-03-27 ENCOUNTER — Encounter: Payer: Self-pay | Admitting: Vascular Surgery

## 2011-04-21 ENCOUNTER — Encounter: Payer: Self-pay | Admitting: Vascular Surgery

## 2011-04-22 ENCOUNTER — Other Ambulatory Visit (INDEPENDENT_AMBULATORY_CARE_PROVIDER_SITE_OTHER): Payer: Medicare Other | Admitting: Vascular Surgery

## 2011-04-22 ENCOUNTER — Encounter: Payer: Self-pay | Admitting: Vascular Surgery

## 2011-04-22 ENCOUNTER — Ambulatory Visit (INDEPENDENT_AMBULATORY_CARE_PROVIDER_SITE_OTHER): Payer: Medicare Other | Admitting: Vascular Surgery

## 2011-04-22 VITALS — BP 136/84 | HR 76 | Resp 16 | Ht 72.0 in | Wt 216.0 lb

## 2011-04-22 DIAGNOSIS — I739 Peripheral vascular disease, unspecified: Secondary | ICD-10-CM

## 2011-04-22 DIAGNOSIS — I6529 Occlusion and stenosis of unspecified carotid artery: Secondary | ICD-10-CM

## 2011-04-22 DIAGNOSIS — I779 Disorder of arteries and arterioles, unspecified: Secondary | ICD-10-CM

## 2011-04-22 DIAGNOSIS — Z48812 Encounter for surgical aftercare following surgery on the circulatory system: Secondary | ICD-10-CM

## 2011-04-22 NOTE — Progress Notes (Signed)
The patient presents today for followup of his diffuse peripheral vascular occlusive disease. 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 He also has no new lower surety arterial insufficiency.  Past Medical History  Diagnosis Date  . Hyperlipidemia   . Hypertension   . Stroke   . Diabetes mellitus age 65  . Peripheral vascular disease   . Arthritis   . Reflux esophagitis   . Myocardial infarction     silent inferior MI    History  Substance Use Topics  . Smoking status: Former Smoker    Types: Cigarettes    Quit date: 03/26/2009  . Smokeless tobacco: Not on file  . Alcohol Use: No    Family History  Problem Relation Age of Onset  . Cancer Mother 80    pancreatic  . Stroke Father 57    Allergies  Allergen Reactions  . Dilaudid Cough   . Oxycodone   . Penicillins   . Sulfa Drugs Cross Reactors   . Ultram (Tramadol Hcl)     Current outpatient prescriptions:amLODipine-benazepril (LOTREL) 5-20 MG per capsule, Take 1 capsule by mouth 2 (two) times daily.  , Disp: , Rfl: ;  aspirin 81 MG tablet, Take 81 mg by mouth daily.  , Disp: , Rfl: ;  atorvastatin (LIPITOR) 40 MG tablet, Take 40 mg by mouth daily.  , Disp: , Rfl: ;  clopidogrel (PLAVIX) 75 MG tablet, Take 75 mg by mouth daily.  , Disp: , Rfl:  escitalopram (LEXAPRO) 10 MG tablet, Take 10 mg by mouth daily.  , Disp: , Rfl: ;  glimepiride (AMARYL) 2 MG tablet, Take 2 mg by mouth daily before breakfast.  , Disp: , Rfl: ;  L-Methylfolate-B6-B12 (METANX PO), Take by mouth.  , Disp: , Rfl: ;  lansoprazole (PREVACID) 30 MG capsule, Take 30 mg by mouth daily.  , Disp: , Rfl: ;  metFORMIN (GLUCOPHAGE-XR) 500 MG 24 hr tablet, Take 500 mg by mouth at bedtime. Take 3 tablets , Disp: , Rfl:  methocarbamol (ROBAXIN) 500 MG tablet, Take 500 mg by mouth 3 (three) times daily. Take 1000mg  3 times a day , Disp: , Rfl: ;  Methylfol-Methylcob-Acetylcyst (CEREFOLIN NAC  PO), Take by mouth daily.  , Disp: , Rfl: ;  Multiple Vitamin (MULTIVITAMIN) capsule, Take 1 capsule by mouth daily.  , Disp: , Rfl: ;  niacin (NIASPAN) 500 MG CR tablet, Take 500 mg by mouth at bedtime.  , Disp: , Rfl:  pioglitazone (ACTOS) 30 MG tablet, Take 30 mg by mouth daily.  , Disp: , Rfl: ;  topiramate (TOPAMAX) 50 MG tablet, Take 50 mg by mouth 2 (two) times daily.  , Disp: , Rfl: ;  vitamin E 400 UNIT capsule, Take 400 Units by mouth daily.  , Disp: , Rfl:   There were no vitals taken for this visit.  There is no height or weight on file to calculate BMI.        Review of systems no change  Physical exam well-developed well-nourished white male in no acute distress. Neurologically grossly intact. 2+ radial pulses. Carotid arteries without bruits bilaterally. Heart regular rate and rhythm. Chest clear bilaterally.  Duplex carotid: No change in his moderate right internal carotid artery stenosis in the 60-79% range. No significant stenosis in his left endarterectomy site.  Impression and plan: Stable extracranial cerebrovascular occlusive disease. I again reviewed symptoms of carotid disease and he will notify  us immediate should this occur. Otherwise we will see him at 6 month intervals for followup of his carotid disease and lower extremity bypass.

## 2011-04-22 NOTE — Progress Notes (Signed)
Addended by: Brett Canales on: 04/22/2011 04:30 PM   Modules accepted: Orders

## 2011-04-30 NOTE — Procedures (Unsigned)
CAROTID DUPLEX EXAM  INDICATION:  Followup carotid stenosis.  HISTORY: Diabetes:  Yes. Cardiac:  No. Hypertension:  Yes. Smoking:  Previous. Previous Surgery:  Left carotid endarterectomy on 12/19/1992; left carotid endarterectomy redo on 03/10/2000. CV History:  Still has episodes of aphasia. Amaurosis Fugax No, Paresthesias No, Hemiparesis No                                      RIGHT             LEFT Brachial systolic pressure:         132               126 Brachial Doppler waveforms:         WNL               WNL Vertebral direction of flow:        Antegrade         Antegrade DUPLEX VELOCITIES (cm/sec) CCA peak systolic                   58                79 ECA peak systolic                   211               [id number] ICA peak systolic                   223               40 ICA end diastolic                   69                9 PLAQUE MORPHOLOGY:                  Calcified         Question ulcerative PLAQUE AMOUNT:                      Moderate to severe                  Mild PLAQUE LOCATION:                    CCA/ICA/ECA       CCA start point  IMPRESSION: 1. Right internal carotid artery stenosis in the 60%-79% range. 2. Right external carotid artery stenosis present. 3. Left internal carotid artery appears patent with history of     endarterectomy. 4. Ulcerative plaque noted in the left distal common carotid artery     and endarterectomy start point. 5. Bilateral vertebral arteries appear patent and antegrade. 6. Essentially unchanged since study on 08/22/2010.  ___________________________________________ Rosetta Posner, M.D.  SH/MEDQ  D:  04/22/2011  T:  04/22/2011  Job:  [id number]

## 2011-08-12 DIAGNOSIS — M7500 Adhesive capsulitis of unspecified shoulder: Secondary | ICD-10-CM | POA: Diagnosis not present

## 2011-08-12 DIAGNOSIS — G629 Polyneuropathy, unspecified: Secondary | ICD-10-CM

## 2011-08-12 DIAGNOSIS — M75100 Unspecified rotator cuff tear or rupture of unspecified shoulder, not specified as traumatic: Secondary | ICD-10-CM | POA: Diagnosis not present

## 2011-08-12 DIAGNOSIS — M810 Age-related osteoporosis without current pathological fracture: Secondary | ICD-10-CM

## 2011-08-12 HISTORY — PX: FRACTURE SURGERY: SHX138

## 2011-08-12 HISTORY — DX: Age-related osteoporosis without current pathological fracture: M81.0

## 2011-08-12 HISTORY — DX: Polyneuropathy, unspecified: G62.9

## 2011-08-24 DIAGNOSIS — Z23 Encounter for immunization: Secondary | ICD-10-CM | POA: Diagnosis not present

## 2011-08-25 DIAGNOSIS — M47817 Spondylosis without myelopathy or radiculopathy, lumbosacral region: Secondary | ICD-10-CM | POA: Diagnosis not present

## 2011-08-25 DIAGNOSIS — E1142 Type 2 diabetes mellitus with diabetic polyneuropathy: Secondary | ICD-10-CM | POA: Diagnosis not present

## 2011-08-25 DIAGNOSIS — M79609 Pain in unspecified limb: Secondary | ICD-10-CM | POA: Diagnosis not present

## 2011-08-28 DIAGNOSIS — G63 Polyneuropathy in diseases classified elsewhere: Secondary | ICD-10-CM | POA: Diagnosis not present

## 2011-08-29 DIAGNOSIS — M47817 Spondylosis without myelopathy or radiculopathy, lumbosacral region: Secondary | ICD-10-CM | POA: Diagnosis not present

## 2011-08-29 DIAGNOSIS — M79609 Pain in unspecified limb: Secondary | ICD-10-CM | POA: Diagnosis not present

## 2011-09-02 DIAGNOSIS — M545 Low back pain: Secondary | ICD-10-CM | POA: Diagnosis not present

## 2011-09-02 DIAGNOSIS — M1990 Unspecified osteoarthritis, unspecified site: Secondary | ICD-10-CM | POA: Diagnosis not present

## 2011-09-02 DIAGNOSIS — M2550 Pain in unspecified joint: Secondary | ICD-10-CM | POA: Diagnosis not present

## 2011-09-02 DIAGNOSIS — K117 Disturbances of salivary secretion: Secondary | ICD-10-CM | POA: Diagnosis not present

## 2011-09-04 DIAGNOSIS — G609 Hereditary and idiopathic neuropathy, unspecified: Secondary | ICD-10-CM | POA: Diagnosis not present

## 2011-09-04 DIAGNOSIS — K219 Gastro-esophageal reflux disease without esophagitis: Secondary | ICD-10-CM | POA: Diagnosis not present

## 2011-09-04 DIAGNOSIS — I679 Cerebrovascular disease, unspecified: Secondary | ICD-10-CM | POA: Diagnosis not present

## 2011-09-04 DIAGNOSIS — R51 Headache: Secondary | ICD-10-CM | POA: Diagnosis not present

## 2011-09-04 DIAGNOSIS — E1149 Type 2 diabetes mellitus with other diabetic neurological complication: Secondary | ICD-10-CM | POA: Diagnosis not present

## 2011-09-04 DIAGNOSIS — I10 Essential (primary) hypertension: Secondary | ICD-10-CM | POA: Diagnosis not present

## 2011-09-04 DIAGNOSIS — IMO0001 Reserved for inherently not codable concepts without codable children: Secondary | ICD-10-CM | POA: Diagnosis not present

## 2011-09-04 DIAGNOSIS — E7800 Pure hypercholesterolemia, unspecified: Secondary | ICD-10-CM | POA: Diagnosis not present

## 2011-10-03 DIAGNOSIS — M5137 Other intervertebral disc degeneration, lumbosacral region: Secondary | ICD-10-CM | POA: Diagnosis not present

## 2011-10-21 ENCOUNTER — Other Ambulatory Visit: Payer: Medicare Other

## 2011-10-21 ENCOUNTER — Ambulatory Visit: Payer: Medicare Other | Admitting: Vascular Surgery

## 2011-10-29 DIAGNOSIS — M25519 Pain in unspecified shoulder: Secondary | ICD-10-CM | POA: Diagnosis not present

## 2011-10-29 DIAGNOSIS — M19019 Primary osteoarthritis, unspecified shoulder: Secondary | ICD-10-CM | POA: Diagnosis not present

## 2011-11-04 DIAGNOSIS — H2510 Age-related nuclear cataract, unspecified eye: Secondary | ICD-10-CM | POA: Diagnosis not present

## 2011-11-04 DIAGNOSIS — E11319 Type 2 diabetes mellitus with unspecified diabetic retinopathy without macular edema: Secondary | ICD-10-CM | POA: Diagnosis not present

## 2011-11-04 DIAGNOSIS — H10439 Chronic follicular conjunctivitis, unspecified eye: Secondary | ICD-10-CM | POA: Diagnosis not present

## 2011-11-18 DIAGNOSIS — M25519 Pain in unspecified shoulder: Secondary | ICD-10-CM | POA: Diagnosis not present

## 2011-12-08 ENCOUNTER — Encounter: Payer: Self-pay | Admitting: Neurosurgery

## 2011-12-09 ENCOUNTER — Encounter (INDEPENDENT_AMBULATORY_CARE_PROVIDER_SITE_OTHER): Payer: Medicare Other | Admitting: *Deleted

## 2011-12-09 ENCOUNTER — Encounter: Payer: Self-pay | Admitting: Neurosurgery

## 2011-12-09 ENCOUNTER — Other Ambulatory Visit (INDEPENDENT_AMBULATORY_CARE_PROVIDER_SITE_OTHER): Payer: Medicare Other | Admitting: *Deleted

## 2011-12-09 ENCOUNTER — Encounter: Payer: Medicare Other | Admitting: *Deleted

## 2011-12-09 ENCOUNTER — Ambulatory Visit (INDEPENDENT_AMBULATORY_CARE_PROVIDER_SITE_OTHER): Payer: Medicare Other | Admitting: Neurosurgery

## 2011-12-09 VITALS — BP 155/89 | HR 79 | Resp 16 | Ht 72.0 in | Wt 222.0 lb

## 2011-12-09 DIAGNOSIS — Z48812 Encounter for surgical aftercare following surgery on the circulatory system: Secondary | ICD-10-CM

## 2011-12-09 DIAGNOSIS — I63231 Cerebral infarction due to unspecified occlusion or stenosis of right carotid arteries: Secondary | ICD-10-CM

## 2011-12-09 DIAGNOSIS — I6529 Occlusion and stenosis of unspecified carotid artery: Secondary | ICD-10-CM | POA: Diagnosis not present

## 2011-12-09 DIAGNOSIS — I739 Peripheral vascular disease, unspecified: Secondary | ICD-10-CM | POA: Diagnosis not present

## 2011-12-09 DIAGNOSIS — I70219 Atherosclerosis of native arteries of extremities with intermittent claudication, unspecified extremity: Secondary | ICD-10-CM

## 2011-12-09 HISTORY — DX: Cerebral infarction due to unspecified occlusion or stenosis of right carotid arteries: I63.231

## 2011-12-09 NOTE — Progress Notes (Addendum)
[facility] [place] HISTORY AND PHYSICAL   CC: Six-month carotid duplex exam and lower extremity ABIs Referring Physician: Early  History of Present Illness: 66 year old male patient of Dr. Donnetta Hutching seen for a six-month carotid duplex exam for known carotid stenosis. He also has lower extremity ABIs for symptoms of mild claudication. Patient reports his claudication to be very intermittent, he can walk fairly long distances without any difficulty. He states his biggest problem at this point is plantar fasciitis in the left foot for which he is going to see a doctor at [facility]. Regarding his carotid, he has no signs or symptoms of CVA, TIA, diplopia, dysphasia, amaurosis fugax or word finding difficulties.  Past Medical History  Diagnosis Date  . Hyperlipidemia   . Hypertension   . Diabetes mellitus age 38  . Peripheral vascular disease   . Arthritis   . Reflux esophagitis   . Myocardial infarction     silent inferior MI  . Stroke 1987    Right brain stroke    ROS: [x]  Positive   [ ]  Denies    General: [ ]  Weight loss, [ ]  Fever, [ ]  chills Neurologic: [ ]  Dizziness, [ ]  Blackouts, [ ]  Seizure [ ]  Stroke, [ ]  "Mini stroke", [ ]  Slurred speech, [ ]  Temporary blindness; [ ]  weakness in arms or legs, [ ]  Hoarseness Cardiac: [ ]  Chest pain/pressure, [ ]  Shortness of breath at rest [ ]  Shortness of breath with exertion, [ ]  Atrial fibrillation or irregular heartbeat Vascular: [ ]  Pain in legs with walking, [ ]  Pain in legs at rest, [ ]  Pain in legs at night,  [ ]  Non-healing ulcer, [ ]  Blood clot in vein/DVT,   Pulmonary: [ ]  Home oxygen, [ ]  Productive cough, [ ]  Coughing up blood, [ ]  Asthma,  [ ]  Wheezing Musculoskeletal:  [ ]  Arthritis, [ ]  Low back pain, [ ]  Joint pain Hematologic: [ ]  Easy Bruising, [ ]  Anemia; [ ]  Hepatitis Gastrointestinal: [ ]  Blood in stool, [ ]  Gastroesophageal Reflux/heartburn, [ ]  Trouble swallowing Urinary: [ ]  chronic Kidney  disease, [ ]  on HD - [ ]  MWF or [ ]  TTHS, [ ]  Burning with urination, [ ]  Difficulty urinating Skin: [ ]  Rashes, [ ]  Wounds Psychological: [ ]  Anxiety, [ ]  Depression   Social History History  Substance Use Topics  . Smoking status: Former Smoker    Types: Cigarettes    Quit date: 03/26/2009  . Smokeless tobacco: Not on file  . Alcohol Use: No    Family History Family History  Problem Relation Age of Onset  . Cancer Mother 49    pancreatic  . Stroke Father 18  . Deep vein thrombosis Father     Allergies  Allergen Reactions  . Dilaudid Cough   . Oxycodone   . Penicillins   . Sulfa Drugs Cross Reactors   . Ultram (Tramadol Hcl)     Current Outpatient Prescriptions  Medication Sig Dispense Refill  . amLODipine-benazepril (LOTREL) 5-20 MG per capsule Take 1 capsule by mouth 2 (two) times daily.        Marland Kitchen aspirin 81 MG tablet Take 81 mg by mouth daily.        Marland Kitchen atorvastatin (LIPITOR) 40 MG tablet Take 40 mg by mouth daily.        . clopidogrel (PLAVIX) 75 MG tablet Take 75 mg by mouth daily.        Marland Kitchen escitalopram (LEXAPRO) 10  MG tablet Take 10 mg by mouth daily.        Marland Kitchen glimepiride (AMARYL) 2 MG tablet Take 2 mg by mouth daily before breakfast.        . losartan (COZAAR) 100 MG tablet Take 100 mg by mouth Daily.      . metFORMIN (GLUCOPHAGE-XR) 500 MG 24 hr tablet Take 500 mg by mouth at bedtime. Take 3 tablets       . Methylfol-Methylcob-Acetylcyst (CEREFOLIN NAC PO) Take by mouth daily.        . Multiple Vitamin (MULTIVITAMIN) capsule Take 1 capsule by mouth daily.        . niacin (NIASPAN) 500 MG CR tablet Take 500 mg by mouth at bedtime.        . topiramate (TOPAMAX) 50 MG tablet Take 50 mg by mouth 2 (two) times daily.        . vitamin E 400 UNIT capsule Take 400 Units by mouth daily.        Marland Kitchen L-Methylfolate-B6-B12 (METANX PO) Take by mouth.        . lansoprazole (PREVACID) 30 MG capsule Take 30 mg by mouth daily.        . methocarbamol (ROBAXIN) 500 MG tablet Take  500 mg by mouth 3 (three) times daily. Take 1000mg  3 times a day       . pioglitazone (ACTOS) 30 MG tablet Take 30 mg by mouth daily.          Physical Examination  Filed Vitals:   12/09/11 1427  BP: 155/89  Pulse: 79  Resp: 16    Body mass index is 30.11 kg/(m^2).  General:  WDWN in NAD Gait: Normal HEENT: WNL Eyes: Pupils equal Pulmonary: normal non-labored breathing , without Rales, rhonchi,  wheezing Cardiac: RRR, without  Murmurs, rubs or gallops; Abdomen: soft, NT, no masses Skin: no rashes, ulcers noted  Vascular Exam Pulses: 2+ radial pulses bilaterally Carotid bruits: No carotid bruits with audible pulses to auscultation Extremities without ischemic changes, no Gangrene , no cellulitis; no open wounds;  Musculoskeletal: no muscle wasting or atrophy   Neurologic: A&O X 3; Appropriate Affect ; SENSATION: normal; MOTOR FUNCTION:  moving all extremities equally. Speech is fluent/normal  Non-Invasive Vascular Imaging CAROTID DUPLEX 12/09/2011  Right ICA 60 - 79 % stenosis Left ICA 0 - 19% stenosis ABI .70 bilaterally  ASSESSMENT/PLAN: Assessment as above, plan the patient will followup here in 6 months for repeat carotid duplex if at that time he still having intermittent claudication that's very mild we will wait another 6 months to do the ABIs, he is in agreement with this plan. His questions were encouraged and answered.  Beatris Ship ANP   Clinic MD: Early

## 2011-12-10 NOTE — Progress Notes (Signed)
Addended by: Mena Goes on: 12/10/2011 08:32 AM   Modules accepted: Orders

## 2011-12-15 DIAGNOSIS — M216X9 Other acquired deformities of unspecified foot: Secondary | ICD-10-CM

## 2011-12-15 DIAGNOSIS — M25579 Pain in unspecified ankle and joints of unspecified foot: Secondary | ICD-10-CM | POA: Diagnosis not present

## 2011-12-15 DIAGNOSIS — M722 Plantar fascial fibromatosis: Secondary | ICD-10-CM | POA: Insufficient documentation

## 2011-12-15 HISTORY — DX: Plantar fascial fibromatosis: M72.2

## 2011-12-15 HISTORY — DX: Other acquired deformities of unspecified foot: M21.6X9

## 2011-12-16 NOTE — Procedures (Unsigned)
CAROTID DUPLEX EXAM  INDICATION:  Follow up left CEA.  HISTORY: Diabetes:  Yes. Cardiac:  No. Hypertension:  Yes. Smoking:  Previous. Previous Surgery:  Left CEA, 12/19/1992, with revision, 03/10/2000. CV History:  Dysphagia. Amaurosis Fugax No, Paresthesias No, Hemiparesis No.                                      RIGHT             LEFT Brachial systolic pressure:         158               152 Brachial Doppler waveforms:         WNL               WNL Vertebral direction of flow:        Antegrade         Antegrade DUPLEX VELOCITIES (cm/sec) CCA peak systolic                   72                81 ECA peak systolic                   204               82 ICA peak systolic                   209               69 ICA end diastolic                   73                19 PLAQUE MORPHOLOGY:                  Heterogenous      Ulcerative PLAQUE AMOUNT:                      Moderate to severe                  Mild to moderate PLAQUE LOCATION:                    ICA               CEA start point  IMPRESSION: 1. 60% to 79% right internal carotid artery stenosis. 2. Patent left carotid endarterectomy with mild-to-moderate stenosis     of the proximal patch site that appears ulcerative. 3. Bilateral vertebral arteries are antegrade.  ___________________________________________ Rosetta Posner, M.D.  LT/MEDQ  D:  12/09/2011  T:  12/09/2011  Job:  [id number]

## 2011-12-25 DIAGNOSIS — K120 Recurrent oral aphthae: Secondary | ICD-10-CM | POA: Diagnosis not present

## 2011-12-25 DIAGNOSIS — L570 Actinic keratosis: Secondary | ICD-10-CM | POA: Diagnosis not present

## 2012-01-12 DIAGNOSIS — M722 Plantar fascial fibromatosis: Secondary | ICD-10-CM | POA: Diagnosis not present

## 2012-01-12 DIAGNOSIS — M216X9 Other acquired deformities of unspecified foot: Secondary | ICD-10-CM | POA: Diagnosis not present

## 2012-01-19 DIAGNOSIS — R262 Difficulty in walking, not elsewhere classified: Secondary | ICD-10-CM | POA: Diagnosis not present

## 2012-01-19 DIAGNOSIS — M25579 Pain in unspecified ankle and joints of unspecified foot: Secondary | ICD-10-CM | POA: Diagnosis not present

## 2012-01-23 DIAGNOSIS — R262 Difficulty in walking, not elsewhere classified: Secondary | ICD-10-CM | POA: Diagnosis not present

## 2012-01-23 DIAGNOSIS — M25579 Pain in unspecified ankle and joints of unspecified foot: Secondary | ICD-10-CM | POA: Diagnosis not present

## 2012-01-26 DIAGNOSIS — M25579 Pain in unspecified ankle and joints of unspecified foot: Secondary | ICD-10-CM | POA: Diagnosis not present

## 2012-01-26 DIAGNOSIS — R262 Difficulty in walking, not elsewhere classified: Secondary | ICD-10-CM | POA: Diagnosis not present

## 2012-01-30 DIAGNOSIS — M25579 Pain in unspecified ankle and joints of unspecified foot: Secondary | ICD-10-CM | POA: Diagnosis not present

## 2012-01-30 DIAGNOSIS — R262 Difficulty in walking, not elsewhere classified: Secondary | ICD-10-CM | POA: Diagnosis not present

## 2012-02-03 DIAGNOSIS — M25579 Pain in unspecified ankle and joints of unspecified foot: Secondary | ICD-10-CM | POA: Diagnosis not present

## 2012-02-03 DIAGNOSIS — R262 Difficulty in walking, not elsewhere classified: Secondary | ICD-10-CM | POA: Diagnosis not present

## 2012-02-06 DIAGNOSIS — M25579 Pain in unspecified ankle and joints of unspecified foot: Secondary | ICD-10-CM | POA: Diagnosis not present

## 2012-02-06 DIAGNOSIS — R262 Difficulty in walking, not elsewhere classified: Secondary | ICD-10-CM | POA: Diagnosis not present

## 2012-02-09 DIAGNOSIS — R262 Difficulty in walking, not elsewhere classified: Secondary | ICD-10-CM | POA: Diagnosis not present

## 2012-02-09 DIAGNOSIS — M25579 Pain in unspecified ankle and joints of unspecified foot: Secondary | ICD-10-CM | POA: Diagnosis not present

## 2012-02-18 DIAGNOSIS — M722 Plantar fascial fibromatosis: Secondary | ICD-10-CM | POA: Diagnosis not present

## 2012-02-18 DIAGNOSIS — M216X9 Other acquired deformities of unspecified foot: Secondary | ICD-10-CM | POA: Diagnosis not present

## 2012-02-18 DIAGNOSIS — M84376A Stress fracture, unspecified foot, initial encounter for fracture: Secondary | ICD-10-CM

## 2012-02-18 HISTORY — DX: Stress fracture, unspecified foot, initial encounter for fracture: M84.376A

## 2012-03-11 DIAGNOSIS — M25579 Pain in unspecified ankle and joints of unspecified foot: Secondary | ICD-10-CM | POA: Diagnosis not present

## 2012-03-11 DIAGNOSIS — R262 Difficulty in walking, not elsewhere classified: Secondary | ICD-10-CM | POA: Diagnosis not present

## 2012-03-17 DIAGNOSIS — M84376A Stress fracture, unspecified foot, initial encounter for fracture: Secondary | ICD-10-CM | POA: Diagnosis not present

## 2012-03-17 DIAGNOSIS — M722 Plantar fascial fibromatosis: Secondary | ICD-10-CM | POA: Diagnosis not present

## 2012-03-17 DIAGNOSIS — M216X9 Other acquired deformities of unspecified foot: Secondary | ICD-10-CM | POA: Diagnosis not present

## 2012-03-26 DIAGNOSIS — E119 Type 2 diabetes mellitus without complications: Secondary | ICD-10-CM | POA: Diagnosis not present

## 2012-03-26 DIAGNOSIS — R079 Chest pain, unspecified: Secondary | ICD-10-CM | POA: Diagnosis not present

## 2012-03-26 DIAGNOSIS — M8430XA Stress fracture, unspecified site, initial encounter for fracture: Secondary | ICD-10-CM | POA: Diagnosis not present

## 2012-03-26 DIAGNOSIS — I10 Essential (primary) hypertension: Secondary | ICD-10-CM | POA: Diagnosis not present

## 2012-04-06 DIAGNOSIS — M8430XA Stress fracture, unspecified site, initial encounter for fracture: Secondary | ICD-10-CM | POA: Diagnosis not present

## 2012-04-11 DIAGNOSIS — M25579 Pain in unspecified ankle and joints of unspecified foot: Secondary | ICD-10-CM | POA: Diagnosis not present

## 2012-04-11 DIAGNOSIS — R262 Difficulty in walking, not elsewhere classified: Secondary | ICD-10-CM | POA: Diagnosis not present

## 2012-04-16 DIAGNOSIS — H5700 Unspecified anomaly of pupillary function: Secondary | ICD-10-CM | POA: Diagnosis not present

## 2012-04-16 DIAGNOSIS — H43819 Vitreous degeneration, unspecified eye: Secondary | ICD-10-CM | POA: Diagnosis not present

## 2012-04-19 DIAGNOSIS — M84376A Stress fracture, unspecified foot, initial encounter for fracture: Secondary | ICD-10-CM | POA: Diagnosis not present

## 2012-04-19 DIAGNOSIS — M722 Plantar fascial fibromatosis: Secondary | ICD-10-CM | POA: Diagnosis not present

## 2012-04-19 DIAGNOSIS — M216X9 Other acquired deformities of unspecified foot: Secondary | ICD-10-CM | POA: Diagnosis not present

## 2012-05-26 DIAGNOSIS — M899 Disorder of bone, unspecified: Secondary | ICD-10-CM | POA: Diagnosis not present

## 2012-05-26 DIAGNOSIS — R1013 Epigastric pain: Secondary | ICD-10-CM | POA: Diagnosis not present

## 2012-05-26 DIAGNOSIS — M949 Disorder of cartilage, unspecified: Secondary | ICD-10-CM | POA: Diagnosis not present

## 2012-05-26 DIAGNOSIS — K3189 Other diseases of stomach and duodenum: Secondary | ICD-10-CM | POA: Diagnosis not present

## 2012-05-26 DIAGNOSIS — Z23 Encounter for immunization: Secondary | ICD-10-CM | POA: Diagnosis not present

## 2012-05-26 DIAGNOSIS — R197 Diarrhea, unspecified: Secondary | ICD-10-CM | POA: Diagnosis not present

## 2012-05-26 DIAGNOSIS — IMO0001 Reserved for inherently not codable concepts without codable children: Secondary | ICD-10-CM | POA: Diagnosis not present

## 2012-06-03 DIAGNOSIS — G43009 Migraine without aura, not intractable, without status migrainosus: Secondary | ICD-10-CM | POA: Diagnosis not present

## 2012-06-07 ENCOUNTER — Encounter: Payer: Self-pay | Admitting: Neurosurgery

## 2012-06-08 ENCOUNTER — Ambulatory Visit: Payer: Medicare Other | Admitting: Neurosurgery

## 2012-06-08 ENCOUNTER — Encounter: Payer: Self-pay | Admitting: Neurosurgery

## 2012-06-08 ENCOUNTER — Ambulatory Visit (INDEPENDENT_AMBULATORY_CARE_PROVIDER_SITE_OTHER): Payer: Medicare Other | Admitting: Vascular Surgery

## 2012-06-08 ENCOUNTER — Ambulatory Visit (INDEPENDENT_AMBULATORY_CARE_PROVIDER_SITE_OTHER): Payer: Medicare Other | Admitting: Neurosurgery

## 2012-06-08 ENCOUNTER — Other Ambulatory Visit: Payer: Medicare Other

## 2012-06-08 VITALS — BP 141/89 | HR 93 | Resp 16 | Ht 71.0 in | Wt 223.2 lb

## 2012-06-08 DIAGNOSIS — I70219 Atherosclerosis of native arteries of extremities with intermittent claudication, unspecified extremity: Secondary | ICD-10-CM

## 2012-06-08 DIAGNOSIS — Z48812 Encounter for surgical aftercare following surgery on the circulatory system: Secondary | ICD-10-CM

## 2012-06-08 DIAGNOSIS — I6529 Occlusion and stenosis of unspecified carotid artery: Secondary | ICD-10-CM

## 2012-06-08 DIAGNOSIS — I739 Peripheral vascular disease, unspecified: Secondary | ICD-10-CM

## 2012-06-08 NOTE — Progress Notes (Signed)
[facility] [place] Carotid Office Note  CC: Carotid surveillance with ABIs Referring Physician: Early  History of Present Illness: 66 year old male patient of Dr. Donnetta Hutching status post left carotid endarterectomy in 1994 with revision in 2001, the patient also has a history of an aortobifemoral bypass graft in 1998. The patient denies signs or symptoms of CVA, TIA, amaurosis fugax or any neural deficit. The patient states he does have some difficulty walking but has had a broken foot as well as plantar fasciitis and he also has back pain that causes problems with his gait therefore he does not truly describe claudication and has no rest pain.  Past Medical History  Diagnosis Date  . Hyperlipidemia   . Hypertension   . Diabetes mellitus age 53  . Peripheral vascular disease   . Arthritis   . Reflux esophagitis   . Myocardial infarction     silent inferior MI  . Stroke 1987    Right brain stroke    ROS: [x]  Positive   [ ]  Denies    General: [ ]  Weight loss, [ ]  Fever, [ ]  chills Neurologic: [ ]  Dizziness, [ ]  Blackouts, [ ]  Seizure [ ]  Stroke, [ ]  "Mini stroke", [ ]  Slurred speech, [ ]  Temporary blindness; [ ]  weakness in arms or legs, [ ]  Hoarseness Cardiac: [ ]  Chest pain/pressure, [ ]  Shortness of breath at rest [ ]  Shortness of breath with exertion, [ ]  Atrial fibrillation or irregular heartbeat Vascular: [ ]  Pain in legs with walking, [ ]  Pain in legs at rest, [ ]  Pain in legs at night,  [ ]  Non-healing ulcer, [ ]  Blood clot in vein/DVT,   Pulmonary: [ ]  Home oxygen, [ ]  Productive cough, [ ]  Coughing up blood, [ ]  Asthma,  [ ]  Wheezing Musculoskeletal:  [ ]  Arthritis, [ ]  Low back pain, [ ]  Joint pain Hematologic: [ ]  Easy Bruising, [ ]  Anemia; [ ]  Hepatitis Gastrointestinal: [ ]  Blood in stool, [ ]  Gastroesophageal Reflux/heartburn, [ ]  Trouble swallowing Urinary: [ ]  chronic Kidney disease, [ ]  on HD - [ ]  MWF or [ ]  TTHS, [ ]  Burning with urination, [ ]   Difficulty urinating Skin: [ ]  Rashes, [ ]  Wounds Psychological: [ ]  Anxiety, [ ]  Depression   Social History History  Substance Use Topics  . Smoking status: Former Smoker    Types: Cigarettes    Quit date: 03/26/2009  . Smokeless tobacco: Not on file  . Alcohol Use: No    Family History Family History  Problem Relation Age of Onset  . Cancer Mother 25    pancreatic  . Stroke Father 17  . Deep vein thrombosis Father     Allergies  Allergen Reactions  . Dilaudid Cough   . Oxycodone   . Penicillins   . Sulfa Drugs Cross Reactors   . Ultram (Tramadol Hcl)     Current Outpatient Prescriptions  Medication Sig Dispense Refill  . amLODipine-benazepril (LOTREL) 5-20 MG per capsule Take 1 capsule by mouth 2 (two) times daily.        Marland Kitchen aspirin 81 MG tablet Take 81 mg by mouth daily.        Marland Kitchen atorvastatin (LIPITOR) 40 MG tablet Take 40 mg by mouth daily.        . clopidogrel (PLAVIX) 75 MG tablet Take 75 mg by mouth daily.        Marland Kitchen escitalopram (LEXAPRO) 10 MG tablet Take 10 mg by mouth daily.        Marland Kitchen  glimepiride (AMARYL) 2 MG tablet Take 2 mg by mouth daily before breakfast.        . L-Methylfolate-B6-B12 (METANX PO) Take by mouth.        . lansoprazole (PREVACID) 30 MG capsule Take 30 mg by mouth daily.        Marland Kitchen losartan (COZAAR) 100 MG tablet Take 100 mg by mouth Daily.      . metFORMIN (GLUCOPHAGE-XR) 500 MG 24 hr tablet Take 500 mg by mouth at bedtime. Take 3 tablets       . methocarbamol (ROBAXIN) 500 MG tablet Take 500 mg by mouth 3 (three) times daily. Take 1000mg  3 times a day       . Methylfol-Methylcob-Acetylcyst (CEREFOLIN NAC PO) Take by mouth daily.        . Multiple Vitamin (MULTIVITAMIN) capsule Take 1 capsule by mouth daily.        . niacin (NIASPAN) 500 MG CR tablet Take 500 mg by mouth at bedtime.        . pioglitazone (ACTOS) 30 MG tablet Take 30 mg by mouth daily.        Marland Kitchen topiramate (TOPAMAX) 50 MG tablet Take 50 mg by mouth 2 (two) times daily.          . vitamin E 400 UNIT capsule Take 400 Units by mouth daily.          Physical Examination  Filed Vitals:   06/08/12 1203  BP: 141/89  Pulse: 93  Resp:     Body mass index is 31.13 kg/(m^2).  General:  WDWN in NAD Gait: Normal HEENT: WNL Eyes: Pupils equal Pulmonary: normal non-labored breathing , without Rales, rhonchi,  wheezing Cardiac: RRR, without  Murmurs, rubs or gallops; Abdomen: soft, NT, no masses Skin: no rashes, ulcers noted  Vascular Exam Pulses: 3+ radial pulses bilaterally, dorsalis pedis heard with Doppler Carotid bruits: Mild bruit heard on the right, carotid pulse to auscultation left Extremities without ischemic changes, no Gangrene , no cellulitis; no open wounds;  Musculoskeletal: no muscle wasting or atrophy   Neurologic: A&O X 3; Appropriate Affect ; SENSATION: normal; MOTOR FUNCTION:  moving all extremities equally. Speech is fluent/normal  Non-Invasive Vascular Imaging CAROTID DUPLEX 06/08/2012  Right ICA 60 - 79 % stenosis Left ICA 40 - 59 % stenosis ABIs today were 0.64 and monophasic to triphasic on the right, 0.74 and triphasic to monophasic on the left which is virtually unchanged from previous exam when he was 0.7 on the right, 0.7 on the left in April 2013 and his carotid study is unchanged from April 2013  ASSESSMENT/PLAN: Asymptomatic patient with advanced right ICA stenosis, difficulty with ambulation, multifactorial. The patient will followup in 6 months for the repeat ABIs as well as carotid duplex. The patient knows the signs and symptoms of CVA and knows to report to the nearest emergency department should that occur. The patient's questions were encouraged and answered, he is in agreement with this plan.  Beatris Ship ANP   Clinic MD: Early

## 2012-06-08 NOTE — Progress Notes (Signed)
Ankle brachial indices performed @ [facility] 06/08/2012

## 2012-06-08 NOTE — Progress Notes (Signed)
carotid duplex performed @ [facility] 06/08/2012

## 2012-06-09 DIAGNOSIS — Z125 Encounter for screening for malignant neoplasm of prostate: Secondary | ICD-10-CM | POA: Diagnosis not present

## 2012-06-09 DIAGNOSIS — E291 Testicular hypofunction: Secondary | ICD-10-CM | POA: Diagnosis not present

## 2012-06-09 DIAGNOSIS — M2550 Pain in unspecified joint: Secondary | ICD-10-CM | POA: Diagnosis not present

## 2012-06-09 NOTE — Addendum Note (Signed)
Addended by: Thresa Ross C on: 06/09/2012 11:33 AM   Modules accepted: Orders

## 2012-06-14 DIAGNOSIS — M722 Plantar fascial fibromatosis: Secondary | ICD-10-CM | POA: Diagnosis not present

## 2012-06-14 DIAGNOSIS — M84376A Stress fracture, unspecified foot, initial encounter for fracture: Secondary | ICD-10-CM | POA: Diagnosis not present

## 2012-06-14 DIAGNOSIS — M216X9 Other acquired deformities of unspecified foot: Secondary | ICD-10-CM | POA: Diagnosis not present

## 2012-06-25 DIAGNOSIS — Z85828 Personal history of other malignant neoplasm of skin: Secondary | ICD-10-CM | POA: Diagnosis not present

## 2012-06-25 DIAGNOSIS — L819 Disorder of pigmentation, unspecified: Secondary | ICD-10-CM | POA: Diagnosis not present

## 2012-07-01 DIAGNOSIS — R5383 Other fatigue: Secondary | ICD-10-CM | POA: Diagnosis not present

## 2012-07-01 DIAGNOSIS — E291 Testicular hypofunction: Secondary | ICD-10-CM | POA: Diagnosis not present

## 2012-07-01 DIAGNOSIS — R5381 Other malaise: Secondary | ICD-10-CM | POA: Diagnosis not present

## 2012-08-11 HISTORY — PX: SPINE SURGERY: SHX786

## 2012-08-18 DIAGNOSIS — E291 Testicular hypofunction: Secondary | ICD-10-CM | POA: Diagnosis not present

## 2012-08-23 DIAGNOSIS — E291 Testicular hypofunction: Secondary | ICD-10-CM | POA: Diagnosis not present

## 2012-08-23 DIAGNOSIS — N400 Enlarged prostate without lower urinary tract symptoms: Secondary | ICD-10-CM | POA: Diagnosis not present

## 2012-08-31 DIAGNOSIS — E291 Testicular hypofunction: Secondary | ICD-10-CM | POA: Diagnosis not present

## 2012-09-27 DIAGNOSIS — M899 Disorder of bone, unspecified: Secondary | ICD-10-CM | POA: Diagnosis not present

## 2012-09-27 DIAGNOSIS — E7800 Pure hypercholesterolemia, unspecified: Secondary | ICD-10-CM | POA: Diagnosis not present

## 2012-09-27 DIAGNOSIS — M949 Disorder of cartilage, unspecified: Secondary | ICD-10-CM | POA: Diagnosis not present

## 2012-09-27 DIAGNOSIS — IMO0002 Reserved for concepts with insufficient information to code with codable children: Secondary | ICD-10-CM | POA: Diagnosis not present

## 2012-09-27 DIAGNOSIS — Z0000 Encounter for general adult medical examination without abnormal findings: Secondary | ICD-10-CM | POA: Diagnosis not present

## 2012-09-27 DIAGNOSIS — E1149 Type 2 diabetes mellitus with other diabetic neurological complication: Secondary | ICD-10-CM | POA: Diagnosis not present

## 2012-09-27 DIAGNOSIS — I10 Essential (primary) hypertension: Secondary | ICD-10-CM | POA: Diagnosis not present

## 2012-09-27 DIAGNOSIS — G609 Hereditary and idiopathic neuropathy, unspecified: Secondary | ICD-10-CM | POA: Diagnosis not present

## 2012-09-27 DIAGNOSIS — Z23 Encounter for immunization: Secondary | ICD-10-CM | POA: Diagnosis not present

## 2012-09-28 DIAGNOSIS — E291 Testicular hypofunction: Secondary | ICD-10-CM | POA: Diagnosis not present

## 2012-09-29 DIAGNOSIS — M5430 Sciatica, unspecified side: Secondary | ICD-10-CM | POA: Diagnosis not present

## 2012-09-29 DIAGNOSIS — M4716 Other spondylosis with myelopathy, lumbar region: Secondary | ICD-10-CM | POA: Diagnosis not present

## 2012-09-29 DIAGNOSIS — M545 Low back pain: Secondary | ICD-10-CM | POA: Diagnosis not present

## 2012-09-30 ENCOUNTER — Other Ambulatory Visit: Payer: Self-pay | Admitting: Orthopedic Surgery

## 2012-09-30 DIAGNOSIS — M5127 Other intervertebral disc displacement, lumbosacral region: Secondary | ICD-10-CM

## 2012-10-04 ENCOUNTER — Ambulatory Visit
Admission: RE | Admit: 2012-10-04 | Discharge: 2012-10-04 | Disposition: A | Discharge status: Home / Self Care | Payer: Medicare Other | Source: Ambulatory Visit | Attending: Orthopedic Surgery | Admitting: Orthopedic Surgery

## 2012-10-04 DIAGNOSIS — M5127 Other intervertebral disc displacement, lumbosacral region: Secondary | ICD-10-CM

## 2012-10-04 DIAGNOSIS — M4310 Spondylolisthesis, site unspecified: Secondary | ICD-10-CM | POA: Diagnosis not present

## 2012-10-04 DIAGNOSIS — M48061 Spinal stenosis, lumbar region without neurogenic claudication: Secondary | ICD-10-CM | POA: Diagnosis not present

## 2012-10-04 DIAGNOSIS — M5126 Other intervertebral disc displacement, lumbar region: Secondary | ICD-10-CM | POA: Diagnosis not present

## 2012-10-11 DIAGNOSIS — M4716 Other spondylosis with myelopathy, lumbar region: Secondary | ICD-10-CM | POA: Diagnosis not present

## 2012-10-11 DIAGNOSIS — M545 Low back pain: Secondary | ICD-10-CM | POA: Diagnosis not present

## 2012-10-11 DIAGNOSIS — M5430 Sciatica, unspecified side: Secondary | ICD-10-CM | POA: Diagnosis not present

## 2012-10-13 DIAGNOSIS — M545 Low back pain: Secondary | ICD-10-CM | POA: Diagnosis not present

## 2012-10-13 DIAGNOSIS — M4716 Other spondylosis with myelopathy, lumbar region: Secondary | ICD-10-CM | POA: Diagnosis not present

## 2012-10-13 DIAGNOSIS — M5430 Sciatica, unspecified side: Secondary | ICD-10-CM | POA: Diagnosis not present

## 2012-10-19 DIAGNOSIS — M545 Low back pain: Secondary | ICD-10-CM | POA: Diagnosis not present

## 2012-10-19 DIAGNOSIS — M4716 Other spondylosis with myelopathy, lumbar region: Secondary | ICD-10-CM | POA: Diagnosis not present

## 2012-10-19 DIAGNOSIS — M5430 Sciatica, unspecified side: Secondary | ICD-10-CM | POA: Diagnosis not present

## 2012-10-26 DIAGNOSIS — M545 Low back pain: Secondary | ICD-10-CM | POA: Diagnosis not present

## 2012-10-26 DIAGNOSIS — E291 Testicular hypofunction: Secondary | ICD-10-CM | POA: Diagnosis not present

## 2012-10-26 DIAGNOSIS — M4716 Other spondylosis with myelopathy, lumbar region: Secondary | ICD-10-CM | POA: Diagnosis not present

## 2012-10-26 DIAGNOSIS — M5430 Sciatica, unspecified side: Secondary | ICD-10-CM | POA: Diagnosis not present

## 2012-10-27 DIAGNOSIS — M545 Low back pain: Secondary | ICD-10-CM | POA: Diagnosis not present

## 2012-10-27 DIAGNOSIS — M4716 Other spondylosis with myelopathy, lumbar region: Secondary | ICD-10-CM | POA: Diagnosis not present

## 2012-10-27 DIAGNOSIS — M5430 Sciatica, unspecified side: Secondary | ICD-10-CM | POA: Diagnosis not present

## 2012-10-28 DIAGNOSIS — M4716 Other spondylosis with myelopathy, lumbar region: Secondary | ICD-10-CM | POA: Diagnosis not present

## 2012-10-28 DIAGNOSIS — M545 Low back pain: Secondary | ICD-10-CM | POA: Diagnosis not present

## 2012-10-28 DIAGNOSIS — M5430 Sciatica, unspecified side: Secondary | ICD-10-CM | POA: Diagnosis not present

## 2012-11-02 DIAGNOSIS — M4716 Other spondylosis with myelopathy, lumbar region: Secondary | ICD-10-CM | POA: Diagnosis not present

## 2012-11-02 DIAGNOSIS — M5430 Sciatica, unspecified side: Secondary | ICD-10-CM | POA: Diagnosis not present

## 2012-11-02 DIAGNOSIS — M545 Low back pain: Secondary | ICD-10-CM | POA: Diagnosis not present

## 2012-11-03 DIAGNOSIS — H2510 Age-related nuclear cataract, unspecified eye: Secondary | ICD-10-CM | POA: Diagnosis not present

## 2012-11-03 DIAGNOSIS — E11319 Type 2 diabetes mellitus with unspecified diabetic retinopathy without macular edema: Secondary | ICD-10-CM | POA: Diagnosis not present

## 2012-11-05 DIAGNOSIS — M545 Low back pain: Secondary | ICD-10-CM | POA: Diagnosis not present

## 2012-11-05 DIAGNOSIS — M4716 Other spondylosis with myelopathy, lumbar region: Secondary | ICD-10-CM | POA: Diagnosis not present

## 2012-11-05 DIAGNOSIS — M5430 Sciatica, unspecified side: Secondary | ICD-10-CM | POA: Diagnosis not present

## 2012-11-09 DIAGNOSIS — M4716 Other spondylosis with myelopathy, lumbar region: Secondary | ICD-10-CM | POA: Diagnosis not present

## 2012-11-09 DIAGNOSIS — M5430 Sciatica, unspecified side: Secondary | ICD-10-CM | POA: Diagnosis not present

## 2012-11-09 DIAGNOSIS — M545 Low back pain: Secondary | ICD-10-CM | POA: Diagnosis not present

## 2012-11-12 DIAGNOSIS — M5430 Sciatica, unspecified side: Secondary | ICD-10-CM | POA: Diagnosis not present

## 2012-11-12 DIAGNOSIS — M545 Low back pain: Secondary | ICD-10-CM | POA: Diagnosis not present

## 2012-11-12 DIAGNOSIS — M4716 Other spondylosis with myelopathy, lumbar region: Secondary | ICD-10-CM | POA: Diagnosis not present

## 2012-11-15 DIAGNOSIS — M4310 Spondylolisthesis, site unspecified: Secondary | ICD-10-CM | POA: Diagnosis not present

## 2012-11-15 DIAGNOSIS — M545 Low back pain: Secondary | ICD-10-CM | POA: Diagnosis not present

## 2012-11-15 DIAGNOSIS — IMO0002 Reserved for concepts with insufficient information to code with codable children: Secondary | ICD-10-CM | POA: Diagnosis not present

## 2012-11-18 DIAGNOSIS — M4716 Other spondylosis with myelopathy, lumbar region: Secondary | ICD-10-CM | POA: Diagnosis not present

## 2012-11-18 DIAGNOSIS — M5430 Sciatica, unspecified side: Secondary | ICD-10-CM | POA: Diagnosis not present

## 2012-11-18 DIAGNOSIS — M545 Low back pain: Secondary | ICD-10-CM | POA: Diagnosis not present

## 2012-11-19 DIAGNOSIS — IMO0001 Reserved for inherently not codable concepts without codable children: Secondary | ICD-10-CM | POA: Diagnosis not present

## 2012-11-22 ENCOUNTER — Encounter: Payer: Self-pay | Admitting: Vascular Surgery

## 2012-11-23 ENCOUNTER — Ambulatory Visit (INDEPENDENT_AMBULATORY_CARE_PROVIDER_SITE_OTHER): Payer: Medicare Other | Admitting: Vascular Surgery

## 2012-11-23 ENCOUNTER — Encounter (INDEPENDENT_AMBULATORY_CARE_PROVIDER_SITE_OTHER): Payer: Medicare Other | Admitting: *Deleted

## 2012-11-23 ENCOUNTER — Other Ambulatory Visit (INDEPENDENT_AMBULATORY_CARE_PROVIDER_SITE_OTHER): Payer: Medicare Other | Admitting: *Deleted

## 2012-11-23 ENCOUNTER — Encounter: Payer: Self-pay | Admitting: Vascular Surgery

## 2012-11-23 DIAGNOSIS — I6529 Occlusion and stenosis of unspecified carotid artery: Secondary | ICD-10-CM

## 2012-11-23 DIAGNOSIS — Z48812 Encounter for surgical aftercare following surgery on the circulatory system: Secondary | ICD-10-CM | POA: Diagnosis not present

## 2012-11-23 DIAGNOSIS — N400 Enlarged prostate without lower urinary tract symptoms: Secondary | ICD-10-CM | POA: Diagnosis not present

## 2012-11-23 DIAGNOSIS — I739 Peripheral vascular disease, unspecified: Secondary | ICD-10-CM

## 2012-11-23 DIAGNOSIS — E291 Testicular hypofunction: Secondary | ICD-10-CM | POA: Diagnosis not present

## 2012-11-23 NOTE — Addendum Note (Signed)
Addended by: Thresa Ross C on: 11/23/2012 04:09 PM   Modules accepted: Orders

## 2012-11-23 NOTE — Progress Notes (Signed)
Patient has today for followup of his diffuse peripheral vascular occlusive disease. He undergone prior aortobifemoral bypass for lower surety arterial occlusive disease in 1998. He underwent a left carotid endarterectomy in 1994 with a redo for recurrent stenosis in 2001. The target today. He has had no new vascular issues. He does have a severe degenerative disc disease in his back causing a lower surety pain. No new cardiac difficulties. He specifically denies any neurologic deficits.  Past Medical History  Diagnosis Date  . Hyperlipidemia   . Hypertension   . Diabetes mellitus age 18  . Peripheral vascular disease   . Arthritis   . Reflux esophagitis   . Myocardial infarction     silent inferior MI  . Stroke 1987    Right brain stroke  . Neuropathy 2013  . Osteoporosis 2013    History  Substance Use Topics  . Smoking status: Former Smoker    Types: Cigarettes    Quit date: 03/26/2009  . Smokeless tobacco: Not on file  . Alcohol Use: No    Family History  Problem Relation Age of Onset  . Cancer Mother 34    pancreatic  . Stroke Father 67  . Deep vein thrombosis Father     Allergies  Allergen Reactions  . Dilaudid Cough   . Oxycodone   . Penicillins   . Sulfa Drugs Cross Reactors   . Ultram (Tramadol Hcl)     Current outpatient prescriptions:aspirin 81 MG tablet, Take 81 mg by mouth daily.  , Disp: , Rfl: ;  atorvastatin (LIPITOR) 40 MG tablet, Take 40 mg by mouth daily.  , Disp: , Rfl: ;  Calcium Carbonate-Vitamin D (CALCIUM-D) 600-400 MG-UNIT TABS, Take by mouth., Disp: , Rfl: ;  carvedilol (COREG) 3.125 MG tablet, Take 3.125 mg by mouth 2 (two) times daily with a meal., Disp: , Rfl:  clopidogrel (PLAVIX) 75 MG tablet, Take 75 mg by mouth daily.  , Disp: , Rfl: ;  glimepiride (AMARYL) 2 MG tablet, Take 2 mg by mouth daily before breakfast.  , Disp: , Rfl: ;  lansoprazole (PREVACID) 30 MG capsule, Take 30 mg by mouth daily.  , Disp: , Rfl: ;  losartan (COZAAR) 100 MG  tablet, Take 100 mg by mouth Daily., Disp: , Rfl: ;  metFORMIN (GLUCOPHAGE-XR) 500 MG 24 hr tablet, Take 1,000 mg by mouth 2 (two) times daily. , Disp: , Rfl:  Multiple Vitamin (MULTIVITAMIN) capsule, Take 1 capsule by mouth daily.  , Disp: , Rfl: ;  niacin (NIASPAN) 500 MG CR tablet, Take 500 mg by mouth at bedtime.  , Disp: , Rfl: ;  omeprazole (PRILOSEC) 20 MG capsule, Take 20 mg by mouth daily., Disp: , Rfl: ;  vitamin E 400 UNIT capsule, Take 400 Units by mouth daily.  , Disp: , Rfl: ;  amLODipine-benazepril (LOTREL) 5-20 MG per capsule, Take 1 capsule by mouth 2 (two) times daily.  , Disp: , Rfl:  Cholecalciferol (D-3-5) 5000 UNITS capsule, Take 5,000 Units by mouth daily., Disp: , Rfl: ;  escitalopram (LEXAPRO) 10 MG tablet, Take 10 mg by mouth daily.  , Disp: , Rfl: ;  L-Methylfolate-B6-B12 (METANX PO), Take by mouth.  , Disp: , Rfl: ;  methocarbamol (ROBAXIN) 500 MG tablet, Take 500 mg by mouth 3 (three) times daily. Take 1000mg  3 times a day , Disp: , Rfl:  Methylfol-Methylcob-Acetylcyst (CEREFOLIN NAC PO), Take by mouth daily.  , Disp: , Rfl: ;  pioglitazone (ACTOS) 30 MG tablet, Take 30  mg by mouth daily.  , Disp: , Rfl: ;  topiramate (TOPAMAX) 50 MG tablet, Take 50 mg by mouth 2 (two) times daily.  , Disp: , Rfl:   BP 150/83  Pulse 99  Resp 18  Ht 6' (1.829 m)  Wt 221 lb 14.4 oz (100.653 kg)  BMI 30.09 kg/m2  Body mass index is 30.09 kg/(m^2).       Physical exam well-developed well-nourished appearing stated age in no acute distress Left carotid well-healed with no bruits bilaterally Chest clear with no wheezes bilateral Heart regular rate and rhythm Abdomen soft nontender no hernia noted Pulse status 2+ femoral pulses bilaterally absent popliteal and distal pulses. Neurologically grossly intact  Vascular lab was ordered evaluated by myself discussed with the patient. This does show widely patent endarterectomy on the left with very mild narrowing. On the right he has 40-59%  stenosis in the internal carotid artery.  Lower extremity Doppler studies are stable at 0.66 on the right and 0.70 on the left  Impression and plan: Stable procedure fresh occlusive disease. Again discussed symptoms of carotid disease and has varicosities occur. Otherwise we'll see him in a yearly surveillance of his diffuse peripheral vascular occlusive disease

## 2012-11-24 DIAGNOSIS — M545 Low back pain: Secondary | ICD-10-CM | POA: Diagnosis not present

## 2012-11-24 DIAGNOSIS — M5430 Sciatica, unspecified side: Secondary | ICD-10-CM | POA: Diagnosis not present

## 2012-11-24 DIAGNOSIS — M4716 Other spondylosis with myelopathy, lumbar region: Secondary | ICD-10-CM | POA: Diagnosis not present

## 2012-11-29 DIAGNOSIS — M4310 Spondylolisthesis, site unspecified: Secondary | ICD-10-CM | POA: Diagnosis not present

## 2012-11-29 DIAGNOSIS — IMO0002 Reserved for concepts with insufficient information to code with codable children: Secondary | ICD-10-CM | POA: Diagnosis not present

## 2012-11-29 DIAGNOSIS — M48061 Spinal stenosis, lumbar region without neurogenic claudication: Secondary | ICD-10-CM | POA: Diagnosis not present

## 2012-11-29 DIAGNOSIS — M545 Low back pain: Secondary | ICD-10-CM | POA: Diagnosis not present

## 2012-11-30 DIAGNOSIS — M545 Low back pain: Secondary | ICD-10-CM | POA: Diagnosis not present

## 2012-11-30 DIAGNOSIS — M5430 Sciatica, unspecified side: Secondary | ICD-10-CM | POA: Diagnosis not present

## 2012-11-30 DIAGNOSIS — M4716 Other spondylosis with myelopathy, lumbar region: Secondary | ICD-10-CM | POA: Diagnosis not present

## 2012-12-02 DIAGNOSIS — E291 Testicular hypofunction: Secondary | ICD-10-CM | POA: Diagnosis not present

## 2012-12-02 DIAGNOSIS — M169 Osteoarthritis of hip, unspecified: Secondary | ICD-10-CM | POA: Diagnosis not present

## 2012-12-06 DIAGNOSIS — E291 Testicular hypofunction: Secondary | ICD-10-CM | POA: Diagnosis not present

## 2012-12-07 ENCOUNTER — Other Ambulatory Visit: Payer: Medicare Other

## 2012-12-07 ENCOUNTER — Ambulatory Visit: Payer: Medicare Other | Admitting: Neurosurgery

## 2012-12-07 DIAGNOSIS — M5430 Sciatica, unspecified side: Secondary | ICD-10-CM | POA: Diagnosis not present

## 2012-12-07 DIAGNOSIS — M545 Low back pain: Secondary | ICD-10-CM | POA: Diagnosis not present

## 2012-12-07 DIAGNOSIS — M4716 Other spondylosis with myelopathy, lumbar region: Secondary | ICD-10-CM | POA: Diagnosis not present

## 2012-12-09 DIAGNOSIS — M545 Low back pain, unspecified: Secondary | ICD-10-CM | POA: Diagnosis not present

## 2012-12-09 DIAGNOSIS — M4716 Other spondylosis with myelopathy, lumbar region: Secondary | ICD-10-CM | POA: Diagnosis not present

## 2012-12-09 DIAGNOSIS — M5430 Sciatica, unspecified side: Secondary | ICD-10-CM | POA: Diagnosis not present

## 2012-12-15 DIAGNOSIS — M169 Osteoarthritis of hip, unspecified: Secondary | ICD-10-CM | POA: Diagnosis not present

## 2013-01-09 DIAGNOSIS — M4716 Other spondylosis with myelopathy, lumbar region: Secondary | ICD-10-CM | POA: Diagnosis not present

## 2013-01-09 DIAGNOSIS — M5430 Sciatica, unspecified side: Secondary | ICD-10-CM | POA: Diagnosis not present

## 2013-01-09 DIAGNOSIS — M545 Low back pain, unspecified: Secondary | ICD-10-CM | POA: Diagnosis not present

## 2013-01-11 DIAGNOSIS — M545 Low back pain, unspecified: Secondary | ICD-10-CM | POA: Diagnosis not present

## 2013-01-12 ENCOUNTER — Other Ambulatory Visit: Payer: Self-pay | Admitting: Neurosurgery

## 2013-01-12 DIAGNOSIS — M48061 Spinal stenosis, lumbar region without neurogenic claudication: Secondary | ICD-10-CM | POA: Diagnosis not present

## 2013-01-12 DIAGNOSIS — Q762 Congenital spondylolisthesis: Secondary | ICD-10-CM | POA: Diagnosis not present

## 2013-01-13 DIAGNOSIS — M48061 Spinal stenosis, lumbar region without neurogenic claudication: Secondary | ICD-10-CM | POA: Diagnosis not present

## 2013-01-13 DIAGNOSIS — Z7902 Long term (current) use of antithrombotics/antiplatelets: Secondary | ICD-10-CM | POA: Diagnosis not present

## 2013-01-13 DIAGNOSIS — Z006 Encounter for examination for normal comparison and control in clinical research program: Secondary | ICD-10-CM | POA: Diagnosis not present

## 2013-01-13 DIAGNOSIS — I25810 Atherosclerosis of coronary artery bypass graft(s) without angina pectoris: Secondary | ICD-10-CM | POA: Diagnosis not present

## 2013-01-25 ENCOUNTER — Ambulatory Visit: Payer: Medicare Other | Admitting: Vascular Surgery

## 2013-02-08 DIAGNOSIS — M4716 Other spondylosis with myelopathy, lumbar region: Secondary | ICD-10-CM | POA: Diagnosis not present

## 2013-02-08 DIAGNOSIS — M545 Low back pain, unspecified: Secondary | ICD-10-CM | POA: Diagnosis not present

## 2013-02-08 DIAGNOSIS — M5430 Sciatica, unspecified side: Secondary | ICD-10-CM | POA: Diagnosis not present

## 2013-03-16 ENCOUNTER — Ambulatory Visit (HOSPITAL_COMMUNITY)
Admission: RE | Admit: 2013-03-16 | Discharge: 2013-03-16 | Disposition: A | Discharge status: Home / Self Care | Payer: Medicare Other | Source: Ambulatory Visit | Attending: Neurosurgery | Admitting: Neurosurgery

## 2013-03-16 ENCOUNTER — Encounter (HOSPITAL_COMMUNITY)
Admission: RE | Admit: 2013-03-16 | Discharge: 2013-03-16 | Disposition: A | Discharge status: Home / Self Care | Payer: Medicare Other | Source: Ambulatory Visit | Attending: Neurosurgery | Admitting: Neurosurgery

## 2013-03-16 ENCOUNTER — Encounter (HOSPITAL_COMMUNITY): Payer: Self-pay | Admitting: Pharmacy Technician

## 2013-03-16 ENCOUNTER — Encounter (HOSPITAL_COMMUNITY): Payer: Self-pay

## 2013-03-16 DIAGNOSIS — F17200 Nicotine dependence, unspecified, uncomplicated: Secondary | ICD-10-CM | POA: Insufficient documentation

## 2013-03-16 DIAGNOSIS — E119 Type 2 diabetes mellitus without complications: Secondary | ICD-10-CM | POA: Insufficient documentation

## 2013-03-16 DIAGNOSIS — J42 Unspecified chronic bronchitis: Secondary | ICD-10-CM | POA: Diagnosis not present

## 2013-03-16 DIAGNOSIS — I739 Peripheral vascular disease, unspecified: Secondary | ICD-10-CM | POA: Insufficient documentation

## 2013-03-16 DIAGNOSIS — Z01818 Encounter for other preprocedural examination: Secondary | ICD-10-CM | POA: Diagnosis not present

## 2013-03-16 DIAGNOSIS — I10 Essential (primary) hypertension: Secondary | ICD-10-CM | POA: Insufficient documentation

## 2013-03-16 DIAGNOSIS — Z01810 Encounter for preprocedural cardiovascular examination: Secondary | ICD-10-CM | POA: Diagnosis not present

## 2013-03-16 DIAGNOSIS — Z01812 Encounter for preprocedural laboratory examination: Secondary | ICD-10-CM | POA: Diagnosis not present

## 2013-03-16 HISTORY — DX: Irritable bowel syndrome, unspecified: K58.9

## 2013-03-16 HISTORY — DX: Personal history of other diseases of the digestive system: Z87.19

## 2013-03-16 HISTORY — DX: Gastro-esophageal reflux disease without esophagitis: K21.9

## 2013-03-16 HISTORY — DX: Sleep apnea, unspecified: G47.30

## 2013-03-16 LAB — CBC
HCT: 38.2 % — ABNORMAL LOW (ref 39.0–52.0)
Hemoglobin: 12.5 g/dL — ABNORMAL LOW (ref 13.0–17.0)
MCH: 27.9 pg (ref 26.0–34.0)
MCHC: 32.7 g/dL (ref 30.0–36.0)
MCV: 85.3 fL (ref 78.0–100.0)
Platelets: 269 10*3/uL (ref 150–400)
RBC: 4.48 MIL/uL (ref 4.22–5.81)
RDW: 13.8 % (ref 11.5–15.5)
WBC: 8.8 10*3/uL (ref 4.0–10.5)

## 2013-03-16 LAB — SURGICAL PCR SCREEN
MRSA, PCR: NEGATIVE
Staphylococcus aureus: NEGATIVE

## 2013-03-16 LAB — BASIC METABOLIC PANEL
BUN: 19 mg/dL (ref 6–23)
CO2: 27 mEq/L (ref 19–32)
Calcium: 9.8 mg/dL (ref 8.4–10.5)
Chloride: 101 mEq/L (ref 96–112)
Creatinine, Ser: 1.25 mg/dL (ref 0.50–1.35)
GFR calc Af Amer: 66 mL/min — ABNORMAL LOW (ref >90)
GFR calc non Af Amer: 57 mL/min — ABNORMAL LOW (ref >90)
Glucose, Bld: 152 mg/dL — ABNORMAL HIGH (ref 70–99)
Potassium: 4.7 mEq/L (ref 3.5–5.1)
Sodium: 137 mEq/L (ref 135–145)

## 2013-03-16 LAB — TYPE AND SCREEN
ABO/RH(D): A NEG
Antibody Screen: NEGATIVE

## 2013-03-16 LAB — APTT: aPTT: 24 seconds (ref 24–37)

## 2013-03-16 LAB — ABO/RH: ABO/RH(D): A NEG

## 2013-03-16 LAB — PROTIME-INR
INR: 0.94 (ref 0.00–1.49)
Prothrombin Time: 12.4 seconds (ref 11.6–15.2)

## 2013-03-16 NOTE — Pre-Procedure Instructions (Signed)
Bradley [name]  03/16/2013   Your procedure is scheduled on: Thursday, March 24, 2013  Report to [facility] at 5:30 AM.  Call this number if you have problems the morning of surgery: [phone]   Remember:   Do not eat food or drink liquids after midnight.   Take these medicines the morning of surgery with A SIP OF WATER: carvedilol (COREG) 3.125 MG tablet, escitalopram (LEXAPRO) 10 MG tablet, lansoprazole (PREVACID) 30 MG capsule, omeprazole (PRILOSEC) 20 MG capsule, topiramate (TOPAMAX) 50 MG tablet Stop taking Aspirin, clopidogrel (Plavix), and herbal medications (vitamin E 400 UNIT capsule, L-Methylfolate-B6-B12 (METANX PO)  Do not take any NSAIDs ie: Ibuprofen, Advil, Naproxen or any medication containing Aspirin.  Do not wear jewelry, make-up or nail polish.  Do not wear lotions, powders, or perfumes. You may wear deodorant.  Do not shave 48 hours prior to surgery. Men may shave face and neck.  Do not bring valuables to the hospital.  [facility] is not responsible for any belongings or valuables.  Contacts, dentures or bridgework may not be worn into surgery.  Leave suitcase in the car. After surgery it may be brought to your room.  For patients admitted to the hospital, checkout time is 11:00 AM the day of discharge.   Patients discharged the day of surgery will not be allowed to drive home.  Name and phone number of your driver:   Special Instructions: Shower using CHG 2 nights before surgery and the night before surgery.  If you shower the day of surgery use CHG.  Use special wash - you have one bottle of CHG for all showers.  You should use approximately 1/3 of the bottle for each shower.   Please read over the following fact sheets that you were given: Pain Booklet, Coughing and Deep Breathing, Blood Transfusion Information, MRSA Information and Surgical Site Infection Prevention

## 2013-03-17 ENCOUNTER — Encounter (HOSPITAL_COMMUNITY): Payer: Self-pay

## 2013-03-17 NOTE — Progress Notes (Signed)
Anesthesia Chart Review:  Patient is a 67 year old male scheduled for L5-S1 PLIF on 03/24/13 by Dr. Hal Neer.  History includes former smoker, HTN, HLD, DM2, neuropathy, PAD with history of failed iliac artery stent followed by AFBG '98, left carotid endarterectomy '94 with redo '01 (Dr. Donnetta Hutching), reflux esophagitis, arthritis, CVA '87, GERD, hiatal hernia, IBS, OSA with CPAP use, osteoporosis.  Silent inferior MI is also listed on his history, but patient denies any known history of this.  November 2010 stress test showed no evidence of ischemia or infarct.  PCP is Dr. Myriam Jacobson.  He was evaluated by cardiologist Dr. Daneen Schick for symptomatic bradycardia (syncope) and chest pain in 2010. Holter monitor showed SR without atrial or ventricular arrhythmias.  Nuclear stress test on 06/27/09 was also normal without evidence of ischemia or infarction, normal wall motion with EF 51%.  I called and spoke with patient.  He denies chest pain, SOB at rest, edema, syncope.  He has had progressive back pain since 05/2012 and has not been able to be very active since then--prior to October he was able to play golf.  EKG on 03/16/13 showed NSR.  (He has a Q wave in lead III, but this has been present since at least 07/12/99.)  Carotid duplex on 11/23/12 showed widely patent left CEA site with very mild narrowing, 40-59% right ICA stenosis.  CXR on 03/16/13 showed mild chronic bronchitic changes.  Preoperative labs noted.  I can not find any definite evidence to support a history of prior MI, and patient was also unaware of any such history.  He does have a Q wave in lead III but this has been present since at least 2000.  He has had a stress test since then (in 2010) that showed no evidence of ischemia or infarct.  He is asymptomatic, although with limited activity for the past 10 months. His EKG is WNL.  If no acute changes then I anticipate that he can proceed as planned.  Anesthesiologist Dr. Tamala Julian agrees with  plan.  [facility] Short Stay Center/Anesthesiology Phone [phone] 03/17/2013 4:16 PM

## 2013-03-23 MED ORDER — VANCOMYCIN HCL 10 G IV SOLR
1500.0000 mg | INTRAVENOUS | Status: AC
  Administered 2013-03-24: 1500 mg via INTRAVENOUS
  Filled 2013-03-23: qty 1500

## 2013-03-23 MED ORDER — DEXAMETHASONE SODIUM PHOSPHATE 10 MG/ML IJ SOLN
10.0000 mg | INTRAMUSCULAR | Status: AC
  Administered 2013-03-24: 10 mg via INTRAVENOUS
  Filled 2013-03-23: qty 1

## 2013-03-24 ENCOUNTER — Inpatient Hospital Stay (HOSPITAL_COMMUNITY)
Admission: RE | Admit: 2013-03-24 | Discharge: 2013-03-27 | DRG: 460 | Disposition: A | Discharge status: Home Health | Payer: Medicare Other | Source: Ambulatory Visit | Attending: Neurosurgery | Admitting: Neurosurgery

## 2013-03-24 ENCOUNTER — Inpatient Hospital Stay (HOSPITAL_COMMUNITY): Payer: Medicare Other | Admitting: Anesthesiology

## 2013-03-24 ENCOUNTER — Encounter (HOSPITAL_COMMUNITY): Payer: Self-pay | Admitting: Vascular Surgery

## 2013-03-24 ENCOUNTER — Encounter (HOSPITAL_COMMUNITY)
Admission: RE | Disposition: A | Discharge status: Home Health | Payer: Self-pay | Source: Ambulatory Visit | Attending: Neurosurgery

## 2013-03-24 ENCOUNTER — Encounter (HOSPITAL_COMMUNITY): Payer: Self-pay | Admitting: *Deleted

## 2013-03-24 ENCOUNTER — Inpatient Hospital Stay (HOSPITAL_COMMUNITY): Payer: Medicare Other

## 2013-03-24 DIAGNOSIS — Z79899 Other long term (current) drug therapy: Secondary | ICD-10-CM

## 2013-03-24 DIAGNOSIS — Z91018 Allergy to other foods: Secondary | ICD-10-CM | POA: Diagnosis not present

## 2013-03-24 DIAGNOSIS — Z800 Family history of malignant neoplasm of digestive organs: Secondary | ICD-10-CM | POA: Diagnosis not present

## 2013-03-24 DIAGNOSIS — E119 Type 2 diabetes mellitus without complications: Secondary | ICD-10-CM | POA: Diagnosis not present

## 2013-03-24 DIAGNOSIS — I739 Peripheral vascular disease, unspecified: Secondary | ICD-10-CM | POA: Diagnosis present

## 2013-03-24 DIAGNOSIS — I10 Essential (primary) hypertension: Secondary | ICD-10-CM | POA: Diagnosis not present

## 2013-03-24 DIAGNOSIS — M47817 Spondylosis without myelopathy or radiculopathy, lumbosacral region: Secondary | ICD-10-CM | POA: Diagnosis not present

## 2013-03-24 DIAGNOSIS — Z7902 Long term (current) use of antithrombotics/antiplatelets: Secondary | ICD-10-CM | POA: Diagnosis not present

## 2013-03-24 DIAGNOSIS — M4310 Spondylolisthesis, site unspecified: Secondary | ICD-10-CM | POA: Diagnosis not present

## 2013-03-24 DIAGNOSIS — M810 Age-related osteoporosis without current pathological fracture: Secondary | ICD-10-CM | POA: Diagnosis present

## 2013-03-24 DIAGNOSIS — I252 Old myocardial infarction: Secondary | ICD-10-CM | POA: Diagnosis not present

## 2013-03-24 DIAGNOSIS — Z8673 Personal history of transient ischemic attack (TIA), and cerebral infarction without residual deficits: Secondary | ICD-10-CM

## 2013-03-24 DIAGNOSIS — Z823 Family history of stroke: Secondary | ICD-10-CM | POA: Diagnosis not present

## 2013-03-24 DIAGNOSIS — Z882 Allergy status to sulfonamides status: Secondary | ICD-10-CM | POA: Diagnosis not present

## 2013-03-24 DIAGNOSIS — Z7982 Long term (current) use of aspirin: Secondary | ICD-10-CM | POA: Diagnosis not present

## 2013-03-24 DIAGNOSIS — E785 Hyperlipidemia, unspecified: Secondary | ICD-10-CM | POA: Diagnosis present

## 2013-03-24 DIAGNOSIS — Z880 Allergy status to penicillin: Secondary | ICD-10-CM | POA: Diagnosis not present

## 2013-03-24 DIAGNOSIS — Z888 Allergy status to other drugs, medicaments and biological substances status: Secondary | ICD-10-CM

## 2013-03-24 DIAGNOSIS — Q762 Congenital spondylolisthesis: Secondary | ICD-10-CM | POA: Diagnosis not present

## 2013-03-24 DIAGNOSIS — Z87891 Personal history of nicotine dependence: Secondary | ICD-10-CM | POA: Diagnosis not present

## 2013-03-24 DIAGNOSIS — M539 Dorsopathy, unspecified: Secondary | ICD-10-CM | POA: Diagnosis not present

## 2013-03-24 DIAGNOSIS — K589 Irritable bowel syndrome without diarrhea: Secondary | ICD-10-CM | POA: Diagnosis present

## 2013-03-24 DIAGNOSIS — K219 Gastro-esophageal reflux disease without esophagitis: Secondary | ICD-10-CM | POA: Diagnosis not present

## 2013-03-24 HISTORY — PX: POSTERIOR LUMBAR FUSION: SHX6036

## 2013-03-24 LAB — GLUCOSE, CAPILLARY
Glucose-Capillary: 167 mg/dL — ABNORMAL HIGH (ref 70–99)
Glucose-Capillary: 325 mg/dL — ABNORMAL HIGH (ref 70–99)
Glucose-Capillary: 268 mg/dL — ABNORMAL HIGH (ref 70–99)
Glucose-Capillary: 212 mg/dL — ABNORMAL HIGH (ref 70–99)

## 2013-03-24 SURGERY — POSTERIOR LUMBAR FUSION 1 LEVEL
Anesthesia: General | Site: Back | Wound class: Clean

## 2013-03-24 MED ORDER — NEOSTIGMINE METHYLSULFATE 1 MG/ML IJ SOLN
INTRAMUSCULAR | Status: DC | PRN
  Administered 2013-03-24: 4 mg via INTRAVENOUS

## 2013-03-24 MED ORDER — ARTIFICIAL TEARS OP OINT
TOPICAL_OINTMENT | OPHTHALMIC | Status: DC | PRN
  Administered 2013-03-24: 1 via OPHTHALMIC

## 2013-03-24 MED ORDER — ONDANSETRON HCL 4 MG/2ML IJ SOLN: 4.0000 mg | INTRAMUSCULAR | Status: DC | PRN

## 2013-03-24 MED ORDER — MENTHOL 3 MG MT LOZG: 1.0000 | LOZENGE | OROMUCOSAL | Status: DC | PRN

## 2013-03-24 MED ORDER — VANCOMYCIN HCL 10 G IV SOLR
1500.0000 mg | INTRAVENOUS | Status: DC
  Administered 2013-03-25 – 2013-03-26 (×2): 1500 mg via INTRAVENOUS
  Filled 2013-03-24 (×3): qty 1500

## 2013-03-24 MED ORDER — SUCCINYLCHOLINE CHLORIDE 20 MG/ML IJ SOLN
INTRAMUSCULAR | Status: DC | PRN
  Administered 2013-03-24: 120 mg via INTRAVENOUS

## 2013-03-24 MED ORDER — PHENYLEPHRINE HCL 10 MG/ML IJ SOLN
INTRAMUSCULAR | Status: DC | PRN
  Administered 2013-03-24 (×5): 80 ug via INTRAVENOUS

## 2013-03-24 MED ORDER — CARVEDILOL 3.125 MG PO TABS
3.1250 mg | ORAL_TABLET | Freq: Two times a day (BID) | ORAL | Status: DC
  Administered 2013-03-25 – 2013-03-27 (×5): 3.125 mg via ORAL
  Filled 2013-03-24 (×8): qty 1

## 2013-03-24 MED ORDER — THROMBIN 20000 UNITS EX SOLR
CUTANEOUS | Status: DC | PRN
  Administered 2013-03-24: 08:00:00 via TOPICAL

## 2013-03-24 MED ORDER — FENTANYL CITRATE 0.05 MG/ML IJ SOLN
INTRAMUSCULAR | Status: DC | PRN
  Administered 2013-03-24: 50 ug via INTRAVENOUS
  Administered 2013-03-24 (×2): 25 ug via INTRAVENOUS
  Administered 2013-03-24: 100 ug via INTRAVENOUS
  Administered 2013-03-24: 50 ug via INTRAVENOUS

## 2013-03-24 MED ORDER — HYDROMORPHONE HCL PF 1 MG/ML IJ SOLN: 0.2500 mg | INTRAMUSCULAR | Status: DC | PRN

## 2013-03-24 MED ORDER — HYDROMORPHONE HCL PF 1 MG/ML IJ SOLN
1.0000 mg | INTRAMUSCULAR | Status: DC | PRN
  Administered 2013-03-25 – 2013-03-26 (×4): 1 mg via INTRAMUSCULAR
  Filled 2013-03-24 (×4): qty 1

## 2013-03-24 MED ORDER — ACETAMINOPHEN 650 MG RE SUPP: 650.0000 mg | RECTAL | Status: DC | PRN

## 2013-03-24 MED ORDER — LACTATED RINGERS IV SOLN
INTRAVENOUS | Status: DC | PRN
  Administered 2013-03-24: 07:00:00 via INTRAVENOUS

## 2013-03-24 MED ORDER — ROCURONIUM BROMIDE 100 MG/10ML IV SOLN
INTRAVENOUS | Status: DC | PRN
  Administered 2013-03-24: 20 mg via INTRAVENOUS
  Administered 2013-03-24: 10 mg via INTRAVENOUS
  Administered 2013-03-24: 5 mg via INTRAVENOUS
  Administered 2013-03-24: 30 mg via INTRAVENOUS

## 2013-03-24 MED ORDER — PANTOPRAZOLE SODIUM 40 MG IV SOLR
40.0000 mg | Freq: Every day | INTRAVENOUS | Status: DC
  Administered 2013-03-24 – 2013-03-26 (×2): 40 mg via INTRAVENOUS
  Filled 2013-03-24 (×3): qty 40

## 2013-03-24 MED ORDER — HYDROMORPHONE HCL PF 1 MG/ML IJ SOLN
INTRAMUSCULAR | Status: AC
  Filled 2013-03-24: qty 1

## 2013-03-24 MED ORDER — LIDOCAINE HCL 4 % MT SOLN
OROMUCOSAL | Status: DC | PRN
  Administered 2013-03-24: 4 mL via TOPICAL

## 2013-03-24 MED ORDER — METHOCARBAMOL 100 MG/ML IJ SOLN
500.0000 mg | Freq: Once | INTRAVENOUS | Status: DC
  Filled 2013-03-24: qty 5

## 2013-03-24 MED ORDER — METHOCARBAMOL 500 MG PO TABS
500.0000 mg | ORAL_TABLET | Freq: Four times a day (QID) | ORAL | Status: DC | PRN
  Administered 2013-03-25 – 2013-03-27 (×6): 500 mg via ORAL
  Filled 2013-03-24 (×8): qty 1

## 2013-03-24 MED ORDER — LOSARTAN POTASSIUM 50 MG PO TABS
100.0000 mg | ORAL_TABLET | Freq: Every day | ORAL | Status: DC
Start: 2013-03-25 — End: 2013-03-27
  Administered 2013-03-25 – 2013-03-27 (×3): 100 mg via ORAL
  Filled 2013-03-24 (×4): qty 2

## 2013-03-24 MED ORDER — BACITRACIN 50000 UNITS IM SOLR
INTRAMUSCULAR | Status: AC
  Filled 2013-03-24: qty 1

## 2013-03-24 MED ORDER — POTASSIUM CHLORIDE IN NACL 20-0.45 MEQ/L-% IV SOLN
INTRAVENOUS | Status: DC
  Administered 2013-03-24: 19:00:00 via INTRAVENOUS
  Filled 2013-03-24 (×7): qty 1000

## 2013-03-24 MED ORDER — ALBUTEROL SULFATE HFA 108 (90 BASE) MCG/ACT IN AERS
INHALATION_SPRAY | RESPIRATORY_TRACT | Status: DC | PRN
  Administered 2013-03-24 (×2): 2 via RESPIRATORY_TRACT

## 2013-03-24 MED ORDER — METFORMIN HCL ER 500 MG PO TB24
1000.0000 mg | ORAL_TABLET | Freq: Two times a day (BID) | ORAL | Status: DC
  Administered 2013-03-25 – 2013-03-27 (×6): 1000 mg via ORAL
  Filled 2013-03-24 (×9): qty 2

## 2013-03-24 MED ORDER — PROPOFOL 10 MG/ML IV BOLUS
INTRAVENOUS | Status: DC | PRN
  Administered 2013-03-24: 200 mg via INTRAVENOUS

## 2013-03-24 MED ORDER — SODIUM CHLORIDE 0.9 % IV SOLN: 250.0000 mL | INTRAVENOUS | Status: DC

## 2013-03-24 MED ORDER — ONDANSETRON HCL 4 MG/2ML IJ SOLN
INTRAMUSCULAR | Status: DC | PRN
  Administered 2013-03-24: 4 mg via INTRAVENOUS

## 2013-03-24 MED ORDER — PHENOL 1.4 % MT LIQD: 1.0000 | OROMUCOSAL | Status: DC | PRN

## 2013-03-24 MED ORDER — LIDOCAINE HCL (CARDIAC) 20 MG/ML IV SOLN
INTRAVENOUS | Status: DC | PRN
  Administered 2013-03-24: 30 mg via INTRAVENOUS

## 2013-03-24 MED ORDER — ATORVASTATIN CALCIUM 40 MG PO TABS
40.0000 mg | ORAL_TABLET | Freq: Every day | ORAL | Status: DC
  Administered 2013-03-25 – 2013-03-27 (×3): 40 mg via ORAL
  Filled 2013-03-24 (×3): qty 1

## 2013-03-24 MED ORDER — SODIUM CHLORIDE 0.9 % IV SOLN
INTRAVENOUS | Status: AC
  Filled 2013-03-24: qty 500

## 2013-03-24 MED ORDER — 0.9 % SODIUM CHLORIDE (POUR BTL) OPTIME
TOPICAL | Status: DC | PRN
  Administered 2013-03-24: 1000 mL

## 2013-03-24 MED ORDER — GLYCOPYRROLATE 0.2 MG/ML IJ SOLN
INTRAMUSCULAR | Status: DC | PRN
  Administered 2013-03-24: 0.6 mg via INTRAVENOUS

## 2013-03-24 MED ORDER — FENTANYL CITRATE 0.05 MG/ML IJ SOLN
25.0000 ug | INTRAMUSCULAR | Status: DC | PRN
  Administered 2013-03-24 (×4): 25 ug via INTRAVENOUS

## 2013-03-24 MED ORDER — ACETAMINOPHEN 325 MG PO TABS: 650.0000 mg | ORAL_TABLET | ORAL | Status: DC | PRN

## 2013-03-24 MED ORDER — METHOCARBAMOL 100 MG/ML IJ SOLN
500.0000 mg | Freq: Four times a day (QID) | INTRAVENOUS | Status: DC | PRN
  Filled 2013-03-24: qty 5

## 2013-03-24 MED ORDER — METHOCARBAMOL 100 MG/ML IJ SOLN
500.0000 mg | INTRAVENOUS | Status: AC
  Administered 2013-03-24: 500 mg via INTRAVENOUS
  Filled 2013-03-24: qty 5

## 2013-03-24 MED ORDER — OXYCODONE-ACETAMINOPHEN 5-325 MG PO TABS
1.0000 | ORAL_TABLET | ORAL | Status: DC | PRN
  Administered 2013-03-24 – 2013-03-26 (×6): 2 via ORAL
  Filled 2013-03-24 (×6): qty 2

## 2013-03-24 MED ORDER — ALBUMIN HUMAN 5 % IV SOLN
INTRAVENOUS | Status: DC | PRN
  Administered 2013-03-24 (×2): via INTRAVENOUS

## 2013-03-24 MED ORDER — INSULIN ASPART 100 UNIT/ML ~~LOC~~ SOLN
0.0000 [IU] | Freq: Every day | SUBCUTANEOUS | Status: DC
  Administered 2013-03-24: 2 [IU] via SUBCUTANEOUS

## 2013-03-24 MED ORDER — INSULIN ASPART 100 UNIT/ML ~~LOC~~ SOLN
4.0000 [IU] | Freq: Three times a day (TID) | SUBCUTANEOUS | Status: DC
  Administered 2013-03-25 – 2013-03-26 (×5): 4 [IU] via SUBCUTANEOUS

## 2013-03-24 MED ORDER — SODIUM CHLORIDE 0.9 % IR SOLN
Status: DC | PRN
  Administered 2013-03-24: 08:00:00

## 2013-03-24 MED ORDER — PHENYLEPHRINE HCL 10 MG/ML IJ SOLN
10.0000 mg | INTRAVENOUS | Status: DC | PRN
  Administered 2013-03-24: 50 ug/min via INTRAVENOUS

## 2013-03-24 MED ORDER — ONDANSETRON HCL 4 MG/2ML IJ SOLN
INTRAMUSCULAR | Status: AC
  Filled 2013-03-24: qty 2

## 2013-03-24 MED ORDER — INSULIN ASPART 100 UNIT/ML ~~LOC~~ SOLN
0.0000 [IU] | Freq: Three times a day (TID) | SUBCUTANEOUS | Status: DC
  Administered 2013-03-25: 3 [IU] via SUBCUTANEOUS
  Administered 2013-03-25 – 2013-03-26 (×5): 5 [IU] via SUBCUTANEOUS

## 2013-03-24 MED ORDER — SODIUM CHLORIDE 0.9 % IJ SOLN: 3.0000 mL | INTRAMUSCULAR | Status: DC | PRN

## 2013-03-24 MED ORDER — ONDANSETRON HCL 4 MG/2ML IJ SOLN
4.0000 mg | Freq: Once | INTRAMUSCULAR | Status: AC | PRN
  Administered 2013-03-24: 4 mg via INTRAVENOUS

## 2013-03-24 MED ORDER — FENTANYL CITRATE 0.05 MG/ML IJ SOLN
INTRAMUSCULAR | Status: AC
  Filled 2013-03-24: qty 2

## 2013-03-24 MED ORDER — GLIMEPIRIDE 2 MG PO TABS
2.0000 mg | ORAL_TABLET | Freq: Every day | ORAL | Status: DC
  Administered 2013-03-25 – 2013-03-27 (×3): 2 mg via ORAL
  Filled 2013-03-24 (×5): qty 1

## 2013-03-24 MED ORDER — SODIUM CHLORIDE 0.9 % IV SOLN
INTRAVENOUS | Status: DC | PRN
  Administered 2013-03-24: 07:00:00 via INTRAVENOUS

## 2013-03-24 MED ORDER — SODIUM CHLORIDE 0.9 % IJ SOLN
3.0000 mL | Freq: Two times a day (BID) | INTRAMUSCULAR | Status: DC
  Administered 2013-03-25 – 2013-03-26 (×2): 3 mL via INTRAVENOUS

## 2013-03-24 MED ORDER — BUPIVACAINE HCL (PF) 0.5 % IJ SOLN
INTRAMUSCULAR | Status: DC | PRN
  Administered 2013-03-24: 20 mL

## 2013-03-24 SURGICAL SUPPLY — 63 items
BAG DECANTER FOR FLEXI CONT (MISCELLANEOUS) ×2 IMPLANT
BENZOIN TINCTURE PRP APPL 2/3 (GAUZE/BANDAGES/DRESSINGS) ×2 IMPLANT
BLADE SURG ROTATE 9660 (MISCELLANEOUS) IMPLANT
BONE EQUIVA 10CC (Bone Implant) ×2 IMPLANT
BRUSH SCRUB EZ PLAIN DRY (MISCELLANEOUS) ×2 IMPLANT
BUR CUTTER 7.0 ROUND (BURR) ×2 IMPLANT
BUR MATCHSTICK NEURO 3.0 LAGG (BURR) ×2 IMPLANT
CAGE PEEK OPTIMA 11X9X22 SPINE (Cage) ×4 IMPLANT
CANISTER SUCTION 2500CC (MISCELLANEOUS) ×2 IMPLANT
CLOTH BEACON ORANGE TIMEOUT ST (SAFETY) ×2 IMPLANT
CONT SPEC 4OZ CLIKSEAL STRL BL (MISCELLANEOUS) ×4 IMPLANT
COVER BACK TABLE 24X17X13 BIG (DRAPES) IMPLANT
COVER TABLE BACK 60X90 (DRAPES) ×2 IMPLANT
DERMABOND ADHESIVE PROPEN (GAUZE/BANDAGES/DRESSINGS) ×1
DERMABOND ADVANCED (GAUZE/BANDAGES/DRESSINGS) ×2
DERMABOND ADVANCED .7 DNX12 (GAUZE/BANDAGES/DRESSINGS) ×2 IMPLANT
DERMABOND ADVANCED .7 DNX6 (GAUZE/BANDAGES/DRESSINGS) ×1 IMPLANT
DRAPE C-ARM 42X72 X-RAY (DRAPES) ×6 IMPLANT
DRAPE LAPAROTOMY 100X72X124 (DRAPES) ×2 IMPLANT
DRAPE SURG 17X23 STRL (DRAPES) ×4 IMPLANT
DRESSING TELFA 8X3 (GAUZE/BANDAGES/DRESSINGS) ×2 IMPLANT
DRSG OPSITE POSTOP 4X8 (GAUZE/BANDAGES/DRESSINGS) ×2 IMPLANT
DURAPREP 26ML APPLICATOR (WOUND CARE) ×2 IMPLANT
ELECT REM PT RETURN 9FT ADLT (ELECTROSURGICAL) ×2
ELECTRODE REM PT RTRN 9FT ADLT (ELECTROSURGICAL) ×1 IMPLANT
EVACUATOR 1/8 PVC DRAIN (DRAIN) ×2 IMPLANT
GAUZE SPONGE 4X4 16PLY XRAY LF (GAUZE/BANDAGES/DRESSINGS) IMPLANT
GLOVE ECLIPSE 7.5 STRL STRAW (GLOVE) IMPLANT
GLOVE ECLIPSE 8.0 STRL XLNG CF (GLOVE) ×4 IMPLANT
GLOVE EXAM NITRILE LRG STRL (GLOVE) IMPLANT
GLOVE EXAM NITRILE MD LF STRL (GLOVE) IMPLANT
GLOVE EXAM NITRILE XS STR PU (GLOVE) IMPLANT
GOWN BRE IMP SLV AUR LG STRL (GOWN DISPOSABLE) ×2 IMPLANT
GOWN BRE IMP SLV AUR XL STRL (GOWN DISPOSABLE) ×8 IMPLANT
GOWN STRL REIN 2XL LVL4 (GOWN DISPOSABLE) IMPLANT
KIT BASIN OR (CUSTOM PROCEDURE TRAY) ×2 IMPLANT
KIT ROOM TURNOVER OR (KITS) ×2 IMPLANT
MILL MEDIUM DISP (BLADE) ×2 IMPLANT
NEEDLE HYPO 22GX1.5 SAFETY (NEEDLE) ×2 IMPLANT
NS IRRIG 1000ML POUR BTL (IV SOLUTION) ×2 IMPLANT
PACK LAMINECTOMY NEURO (CUSTOM PROCEDURE TRAY) ×2 IMPLANT
PAD ARMBOARD 7.5X6 YLW CONV (MISCELLANEOUS) ×6 IMPLANT
PATTIES SURGICAL .75X.75 (GAUZE/BANDAGES/DRESSINGS) IMPLANT
ROD BENT 40MM (Rod) ×2 IMPLANT
ROD PER BENT 35MM (Rod) ×2 IMPLANT
SCREW POLY 6.5X40MM (Screw) ×4 IMPLANT
SCREW SEQUOIA 6.5X35MM (Screw) ×4 IMPLANT
SPONGE GAUZE 4X4 12PLY (GAUZE/BANDAGES/DRESSINGS) ×2 IMPLANT
SPONGE LAP 4X18 X RAY DECT (DISPOSABLE) IMPLANT
SPONGE SURGIFOAM ABS GEL 100 (HEMOSTASIS) ×2 IMPLANT
STRIP CLOSURE SKIN 1/2X4 (GAUZE/BANDAGES/DRESSINGS) ×4 IMPLANT
SUT VIC AB 0 CT1 18XCR BRD8 (SUTURE) ×1 IMPLANT
SUT VIC AB 0 CT1 8-18 (SUTURE) ×1
SUT VIC AB 2-0 OS6 18 (SUTURE) ×6 IMPLANT
SUT VIC AB 3-0 CP2 18 (SUTURE) ×2 IMPLANT
SYR 20ML ECCENTRIC (SYRINGE) ×2 IMPLANT
TAPE STRIPS DRAPE STRL (GAUZE/BANDAGES/DRESSINGS) ×2 IMPLANT
TOP CLSR SEQUOIA (Orthopedic Implant) ×8 IMPLANT
TOWEL OR 17X24 6PK STRL BLUE (TOWEL DISPOSABLE) ×2 IMPLANT
TOWEL OR 17X26 10 PK STRL BLUE (TOWEL DISPOSABLE) ×2 IMPLANT
TRAP SPECIMEN MUCOUS 40CC (MISCELLANEOUS) ×2 IMPLANT
TRAY FOLEY CATH 14FRSI W/METER (CATHETERS) ×2 IMPLANT
WATER STERILE IRR 1000ML POUR (IV SOLUTION) ×2 IMPLANT

## 2013-03-24 NOTE — Progress Notes (Signed)
ANTIBIOTIC CONSULT NOTE - INITIAL  Pharmacy Consult for Vancomycin Indication: post-op prophylaxis (PCN allergy)  Allergies  Allergen Reactions  . Dilaudid Cough Nausea Only  . Food     "garlic, tomatoes, acid foods"= mouth blisters  . Hydrocodone Nausea Only  . Oxycodone Nausea Only  . Ultram [Tramadol Hcl] Nausea Only  . Penicillins Rash  . Sulfa Drugs Cross Reactors Rash    Patient Measurements: Height: 6' (182.9 cm) IBW/kg (Calculated) : 77.6  Vital Signs: Temp: 97.4 F (36.3 C) (08/14 1751) BP: 118/68 mmHg (08/14 1749) Pulse Rate: 103 (08/14 1749) Intake/Output from previous day:   Intake/Output from this shift: Total I/O In: 2700 [I.V.:2200; IV Piggyback:500] Out: 790 [Urine:540; Drains:50; Blood:200]  Labs:  SCr 1.25 (03/16/13)  Microbiology: Recent Results (from the past 720 hour(s))  SURGICAL PCR SCREEN     Status: None   Collection Time    03/16/13 12:08 PM      Result Value Range Status   MRSA, PCR NEGATIVE  NEGATIVE Final   Staphylococcus aureus NEGATIVE  NEGATIVE Final   Comment:            The Xpert SA Assay (FDA     approved for NASAL specimens     in patients over 50 years of age),     is one component of     a comprehensive surveillance     program.  Test performance has     been validated by [employer] for patients greater     than or equal to 8 year old.     It is not intended     to diagnose infection nor to     guide or monitor treatment.    Medical History: Past Medical History  Diagnosis Date  . Hyperlipidemia   . Hypertension   . Diabetes mellitus age 7  . Peripheral vascular disease   . Arthritis   . Reflux esophagitis   . Stroke 1987    Right brain stroke  . Neuropathy 2013  . Osteoporosis 2013  . Sleep apnea     uses CPAP  . GERD (gastroesophageal reflux disease)   . H/O hiatal hernia   . Irritable bowel syndrome (IBS)   . Myocardial infarction     silent inferior MI; patient denies MI history (03/17/13)      Medications:  Prescriptions prior to admission  Medication Sig Dispense Refill  . aspirin 81 MG tablet Take 81 mg by mouth daily.        Marland Kitchen atorvastatin (LIPITOR) 40 MG tablet Take 40 mg by mouth daily.        . Calcium Carbonate-Vitamin D (CALCIUM-D) 600-400 MG-UNIT TABS Take by mouth.      . carvedilol (COREG) 3.125 MG tablet Take 3.125 mg by mouth 2 (two) times daily with a meal.      . Cholecalciferol (D-3-5) 5000 UNITS capsule Take 5,000 Units by mouth daily.      . clopidogrel (PLAVIX) 75 MG tablet Take 75 mg by mouth daily.        Marland Kitchen glimepiride (AMARYL) 2 MG tablet Take 2 mg by mouth daily before breakfast.        . lansoprazole (PREVACID) 15 MG capsule Take 30 mg by mouth daily.      . Liraglutide (VICTOZA [place]) Inject 0.6 mLs into the skin daily.      Marland Kitchen losartan (COZAAR) 100 MG tablet Take 100 mg by mouth Daily.      Marland Kitchen  metFORMIN (GLUCOPHAGE-XR) 500 MG 24 hr tablet Take 1,000 mg by mouth 2 (two) times daily.       . Multiple Vitamin (MULTIVITAMIN) capsule Take 1 capsule by mouth daily.        . niacin (NIASPAN) 500 MG CR tablet Take 500 mg by mouth at bedtime.        . pioglitazone (ACTOS) 30 MG tablet Take 30 mg by mouth daily.        Marland Kitchen testosterone cypionate (DEPOTESTOTERONE CYPIONATE) 200 MG/ML injection Inject 100 mg into the muscle every 14 (fourteen) days. Pt gets 200mg /ml vial and injects only 0.32ml every two weeks      . vitamin E 400 UNIT capsule Take 400 Units by mouth daily.         Assessment: 67 yo M s/p elective back surgery on 03/24/2013.  Pt has Hemovac drain in place and pharmacy is asked to dose Vancomycin for post-op prophylaxis 2/2 PCN allergy.  Pt received Vancomycin 1500 mg pre-op today at 0715.  Goal of Therapy:  Vancomycin trough level 10-15 mcg/ml  Plan:  Vancomycin 1500 mg IV q24h. Will follow-up plans for length of therapy.  [employer], Pharm.D., BCPS Clinical Pharmacist Pager [phone] 03/24/2013 6:45 PM

## 2013-03-24 NOTE — H&P (Signed)
Bradley [name] is an 67 y.o. male.   Chief Complaint: Back and left greater than right lower extremity pain HPI: The patient is a 67 year old gentleman who was evaluated in the office for back and bilateral lower extremity pain which was worse on the left than the right. He was tried on aggressive conservative therapy with medications which gave him no significant improvement. He underwent imaging study which showed spondylolisthesis at L5-S1 with marked foraminal encroachment and nerve root compression bilaterally more so on the left than the right as well. After failing additional conservative therapy the patient requested surgery and now comes for a bilateral decompression with interbody fusion and pedicle screw fixation. I've had a long discussion with him regarding the risks and benefits of surgical intervention. The risks discussed include but are not limited to bleeding infection weakness numbness paralysis spinal fluid leak coma nonunion and death. We have discussed alternative methods of therapy offered risks and benefits of nonintervention. He's had the opportunity to ask numerous questions and appears to understand. With this information in hand he has requested we proceed with surgery.  Past Medical History  Diagnosis Date  . Hyperlipidemia   . Hypertension   . Diabetes mellitus age 34  . Peripheral vascular disease   . Arthritis   . Reflux esophagitis   . Stroke 1987    Right brain stroke  . Neuropathy 2013  . Osteoporosis 2013  . Sleep apnea     uses CPAP  . GERD (gastroesophageal reflux disease)   . H/O hiatal hernia   . Irritable bowel syndrome (IBS)   . Myocardial infarction     silent inferior MI; patient denies MI history (03/17/13)     Past Surgical History  Procedure Laterality Date  . Carotid endarterectomy  1994 & redo 2001    Left  . Pr vein bypass graft,aorto-fem-pop  1998  . Iliac artery stent    . Fracture surgery  2013    Right   foo  t X's 2  .  Tonsillectomy      Family History  Problem Relation Age of Onset  . Cancer Mother 32    pancreatic  . Stroke Father 64  . Deep vein thrombosis Father    Social History:  reports that he quit smoking about 3 years ago. His smoking use included Cigarettes. He has a 40 pack-year smoking history. He has never used smokeless tobacco. He reports that he does not drink alcohol or use illicit drugs.  Allergies:  Allergies  Allergen Reactions  . Dilaudid Cough Nausea Only  . Food     "garlic, tomatoes, acid foods"= mouth blisters  . Hydrocodone Nausea Only  . Oxycodone Nausea Only  . Ultram [Tramadol Hcl] Nausea Only  . Penicillins Rash  . Sulfa Drugs Cross Reactors Rash    Medications Prior to Admission  Medication Sig Dispense Refill  . aspirin 81 MG tablet Take 81 mg by mouth daily.        Marland Kitchen atorvastatin (LIPITOR) 40 MG tablet Take 40 mg by mouth daily.        . Calcium Carbonate-Vitamin D (CALCIUM-D) 600-400 MG-UNIT TABS Take by mouth.      . carvedilol (COREG) 3.125 MG tablet Take 3.125 mg by mouth 2 (two) times daily with a meal.      . Cholecalciferol (D-3-5) 5000 UNITS capsule Take 5,000 Units by mouth daily.      . clopidogrel (PLAVIX) 75 MG tablet Take 75 mg by mouth daily.        Marland Kitchen  glimepiride (AMARYL) 2 MG tablet Take 2 mg by mouth daily before breakfast.        . lansoprazole (PREVACID) 15 MG capsule Take 30 mg by mouth daily.      . Liraglutide (VICTOZA [place]) Inject 0.6 mLs into the skin daily.      Marland Kitchen losartan (COZAAR) 100 MG tablet Take 100 mg by mouth Daily.      . metFORMIN (GLUCOPHAGE-XR) 500 MG 24 hr tablet Take 1,000 mg by mouth 2 (two) times daily.       . Multiple Vitamin (MULTIVITAMIN) capsule Take 1 capsule by mouth daily.        . niacin (NIASPAN) 500 MG CR tablet Take 500 mg by mouth at bedtime.        . pioglitazone (ACTOS) 30 MG tablet Take 30 mg by mouth daily.        Marland Kitchen testosterone cypionate (DEPOTESTOTERONE CYPIONATE) 200 MG/ML injection Inject 100 mg into  the muscle every 14 (fourteen) days. Pt gets 200mg /ml vial and injects only 0.47ml every two weeks      . vitamin E 400 UNIT capsule Take 400 Units by mouth daily.          Results for orders placed during the hospital encounter of 03/24/13 (from the past 48 hour(s))  GLUCOSE, CAPILLARY     Status: Abnormal   Collection Time    03/24/13  6:02 AM      Result Value Range   Glucose-Capillary 167 (*) 70 - 99 mg/dL   Comment 1 Notify RN     Comment 2 Documented in Chart     No results found.  Review of systems not obtained due to patient factors.  Blood pressure 117/71, pulse 88, temperature 96.8 F (36 C), temperature source Oral, resp. rate 20, SpO2 97.00%.  The patient is awake alert and oriented. His no facial asymmetry. His gait is mildly antalgic. His reports are decreased but equal. His strength is intact with sensation is decreased on the lateral left foot Assessment/Plan Impression is that of spondylolisthesis with spondylolisthesis at L5-S1. The plan is for L5-S1 decompression with interbody fusion and pedicle screw fixation.  Faythe Ghee, MD 03/24/2013, 7:28 AM

## 2013-03-24 NOTE — Progress Notes (Signed)
Patient placed on CPAP per MD order. He is on CPAP of 15 with his own mask and tolerates it very well at this time. He is advised to call RT for any questions and issue with the machine.

## 2013-03-24 NOTE — Op Note (Signed)
Preop diagnosis: Spondylolysis L5 with L5-S1 spondylolisthesis with L5 and S1 nerve root compression Postop diagnosis: Same Procedure: L5-S1 gill procedure with decompression of L5 and S1 nerve roots more so than needed for interbody fusion L5-S1 posterior lumbar interbody fusion with peek interbody spacers L5-S1 nonsegmental instrumentation with [place] pedicle screw system L5-S1 posterolateral fusion Surgeon: Deveron Shamoon Assistant: Nudelman  After being placed the prone position the patient's back was prepped and draped in the usual sterile fashion. Localizing fluoroscopy was used prior to incision to identify the appropriate level. Midline incision was made above the spinous processes of L5 and S1. Using Bovie cutting current the incision was carried on the spinous processes. Set periosteal dissection was then carried out bilaterally on the spinous processes lamina facet joint and the far lateral region to identify the transverse processes of L5 and the far lateral region of the sacrum. Self-retaining retractor was placed for exposure and x-ray showed approach the appropriate level. Spinous processes of L5 and S1 removed. Starting the patient's left side complete decompression was carried out the free-floating lamina with the pars defects bilaterally. We removed the residual pars that was pressing down on the L5 nerve root and decompressing completely along with the fibrous material that had formed around the nonunion. This was a near complete removal of the L5 lamina. Similar decompression was carried on the opposite side at that point residual midline structures were removed to complete the bilateral laminectomy L5-S1. L5 and S1 nerve roots were well visualized well decompressed bilaterally more so than needed for interbody fusion. Disc space was entered and thoroughly cleaned out with pituitary rongeurs. Was then prepared for interbody fusion. He was distracted up to 11 mm size this was felt to be a good  fit. We decorticated the endplates these rotating cutter and then placed an 11 x 9 x 22 mm cage filled with autologous bone morselized allograft on initial side. Prior to placing the second spacer we placed autologous bone morselized allograft it within the interspace to help with interbody fusion. We then placed a second space without difficulty fossae showed the spacers to be in good position. We then placed pedicle screws in standard fashion. We chose drill hole entry points at L5 in standard position and confirmed good positioning with the AP fluoroscopy. We then passed the pedicle all tapped with a 5.5 mm tap and placed 6.5 x 40 mm screws at L5 and 6.5 x 35 mm screws at S1. We decorticated the far lateral region placed a mixture of autologous bone morselized allograft for posterolateral fusion. We then chose appropriate length rod and secured to the top of the screws and did tightening and final tightening with torque and counter torque and compression of L5 on S1. We then irrigated copiously controlled any bleeding with upper coagulation Gelfoam. Left an epidural drain in the epidural space and brought out through a separate stab incision. We then closed the incision in multiple layers of Vicryl on the muscle fascia subcutaneous and subcuticular tissues. Dermabond Steri-Strips were placed on the skin. Shortness was then applied and the patient was extubated and taken to cut them in stable condition.

## 2013-03-24 NOTE — Anesthesia Procedure Notes (Signed)
Procedure Name: Intubation Date/Time: 03/24/2013 7:42 AM Performed by: Vaughan Browner Pre-anesthesia Checklist: Patient identified, Emergency Drugs available, Suction available and Patient being monitored Patient Re-evaluated:Patient Re-evaluated prior to inductionOxygen Delivery Method: Circle system utilized Preoxygenation: Pre-oxygenation with 100% oxygen Intubation Type: IV induction Ventilation: Mask ventilation with difficulty, Two handed mask ventilation required and Oral airway inserted - appropriate to patient size Laryngoscope Size: Mac and 3 Grade View: Grade II Tube type: Oral Tube size: 8.0 mm Number of attempts: 1 Airway Equipment and Method: Bougie stylet Placement Confirmation: ETT inserted through vocal cords under direct vision,  positive ETCO2 and breath sounds checked- equal and bilateral Secured at: 24 cm Tube secured with: Tape Dental Injury: Teeth and Oropharynx as per pre-operative assessment

## 2013-03-24 NOTE — Anesthesia Preprocedure Evaluation (Addendum)
Anesthesia Evaluation  Patient identified by MRN, date of birth, ID band Patient awake    Reviewed: Allergy & Precautions, H&P , NPO status , Patient's Chart, lab work & pertinent test results  History of Anesthesia Complications (+) AWARENESS UNDER ANESTHESIA  Airway Mallampati: I TM Distance: >3 FB Neck ROM: full    Dental  (+) Dental Advisory Given   Pulmonary sleep apnea and Continuous Positive Airway Pressure Ventilation , former smoker,          Cardiovascular hypertension, + Past MI and + Peripheral Vascular Disease Rhythm:regular Rate:Normal     Neuro/Psych CVA    GI/Hepatic hiatal hernia, GERD-  ,  Endo/Other  diabetes, Type 2, Oral Hypoglycemic Agents  Renal/GU      Musculoskeletal   Abdominal   Peds  Hematology   Anesthesia Other Findings   Reproductive/Obstetrics                        Anesthesia Physical Anesthesia Plan  ASA: III  Anesthesia Plan: General   Post-op Pain Management:    Induction: Intravenous  Airway Management Planned: Oral ETT  Additional Equipment:   Intra-op Plan:   Post-operative Plan: Extubation in OR  Informed Consent: I have reviewed the patients History and Physical, chart, labs and discussed the procedure including the risks, benefits and alternatives for the proposed anesthesia with the patient or authorized representative who has indicated his/her understanding and acceptance.     Plan Discussed with: CRNA, Anesthesiologist and Surgeon  Anesthesia Plan Comments:         Anesthesia Quick Evaluation

## 2013-03-24 NOTE — Transfer of Care (Signed)
Immediate Anesthesia Transfer of Care Note  Patient: Bradley [name]  Procedure(s) Performed: Procedure(s) with comments: POSTERIOR LUMBAR FUSION 1 LEVEL (N/A) - POSTERIOR LUMBAR FUSION 1 LEVEL  Patient Location: PACU  Anesthesia Type:General  Level of Consciousness: awake  Airway & Oxygen Therapy: Patient Spontanous Breathing and Patient connected to nasal cannula oxygen  Post-op Assessment: Report given to PACU RN, Post -op Vital signs reviewed and stable, Patient moving all extremities and Patient able to stick tongue midline  Post vital signs: Reviewed and stable  Complications: No apparent anesthesia complications

## 2013-03-24 NOTE — Anesthesia Postprocedure Evaluation (Signed)
  Anesthesia Post-op Note  Patient: Bradley [name]  Procedure(s) Performed: Procedure(s) with comments: POSTERIOR LUMBAR FUSION 1 LEVEL (N/A) - POSTERIOR LUMBAR FUSION 1 LEVEL  Patient Location: PACU  Anesthesia Type:General  Level of Consciousness: awake, alert , oriented and patient cooperative  Airway and Oxygen Therapy: Patient Spontanous Breathing  Post-op Pain: mild  Post-op Assessment: Post-op Vital signs reviewed, Patient's Cardiovascular Status Stable, Respiratory Function Stable, Patent Airway, No signs of Nausea or vomiting and Pain level controlled  Post-op Vital Signs: stable  Complications: No apparent anesthesia complications

## 2013-03-25 LAB — GLUCOSE, CAPILLARY
Glucose-Capillary: 183 mg/dL — ABNORMAL HIGH (ref 70–99)
Glucose-Capillary: 205 mg/dL — ABNORMAL HIGH (ref 70–99)
Glucose-Capillary: 211 mg/dL — ABNORMAL HIGH (ref 70–99)
Glucose-Capillary: 145 mg/dL — ABNORMAL HIGH (ref 70–99)

## 2013-03-25 NOTE — Evaluation (Signed)
Physical Therapy Evaluation Patient Details Name: Bradley [name] MRN: [id number] DOB: [birth year] Today's Date: 03/25/2013 Time: 1137-1205 PT Time Calculation (min): 28 min  PT Assessment / Plan / Recommendation History of Present Illness  Patient is a 67 y/o male admitted with spondylolisthesis at L5-S1 with marked foraminal encroachment and nerve root compression bilaterally more so on the left. Now s/p L5-S1 Gill procedure with decompression and PLIF.  Clinical Impression  Patient presents with pain, decreased knowledge of precautions and DME and decreased activity tolerance limiting independence with mobility.  He will benefit from skilled PT in the acute setting to maximize independence prior to d/c home with assist and HHPT.    PT Assessment  Patient needs continued PT services    Follow Up Recommendations  Home health PT    Does the patient have the potential to tolerate intense rehabilitation    N/AS  Barriers to Discharge  None      Equipment Recommendations  Rolling walker with 5" wheels    Recommendations for Other Services   None  Frequency Min 6X/week    Precautions / Restrictions Precautions Precautions: Back Required Braces or Orthoses: Spinal Brace Spinal Brace: Lumbar corset   Pertinent Vitals/Pain 4/10 in back at rest      Mobility  Bed Mobility Bed Mobility: Sit to Sidelying Left;Scooting to HOB Sit to Sidelying Left: 4: Min assist;HOB flat Scooting to HOB: With rail;4: Min guard Details for Bed Mobility Assistance: cues for technique through sidelying and assist for lifting feet; scooted with cues using bed rails and head board, assist for technique Transfers Transfers: Sit to Stand Sit to Stand: From chair/3-in-1;4: Min guard;With upper extremity assist Stand to Sit: To bed;With upper extremity assist;4: Min guard Details for Transfer Assistance: cues for technique Ambulation/Gait Ambulation/Gait Assistance: 4: Min guard Ambulation Distance  (Feet): 200 Feet Assistive device: Rolling walker Ambulation/Gait Assistance Details: states already walked hallway this am.  mild nausea with upright activity; encouraged frequent mobility with assist to avoid stiffness with static positions Gait Pattern: Step-through pattern;Trunk flexed;Decreased stride length        PT Diagnosis: Difficulty walking;Acute pain  PT Problem List: Decreased mobility;Decreased knowledge of precautions;Decreased knowledge of use of DME;Decreased safety awareness;Decreased activity tolerance PT Treatment Interventions: DME instruction;Balance training;Gait training;Functional mobility training;Therapeutic activities;Therapeutic exercise;Patient/family education     PT Goals(Current goals can be found in the care plan section) Acute Rehab PT Goals Patient Stated Goal: To go home tomorrow PT Goal Formulation: With patient/family Time For Goal Achievement: 04/01/13 Potential to Achieve Goals: Good  Visit Information  Last PT Received On: 03/25/13 Assistance Needed: +1 History of Present Illness: Patient is a 67 y/o male admitted with spondylolisthesis at L5-S1 with marked foraminal encroachment and nerve root compression bilaterally more so on the left. Now s/p L5-S1 Gill procedure with decompression and PLIF.       Prior [place] expects to be discharged to:: Private residence Living Arrangements: Spouse/significant other Available Help at Discharge: Family;Available 24 hours/day Type of Home: Apartment Home Access: Level entry Home Layout: One level Home Equipment: Cane - quad;Shower seat;Electric scooter Additional Comments: tub shower Prior Function Level of Independence: Independent with assistive device(s) Comments: walked independent few yards till left leg pain Communication Communication: No difficulties Dominant Hand: Right    Cognition  Cognition Arousal/Alertness: Awake/alert Behavior During Therapy:  WFL for tasks assessed/performed Overall Cognitive Status: Within Functional Limits for tasks assessed    Extremity/Trunk Assessment Lower Extremity Assessment Lower Extremity Assessment:  Generalized weakness   Balance    End of Session PT - End of Session Equipment Utilized During Treatment: Back brace Activity Tolerance: Patient tolerated treatment well (despite c/o nausea) Patient left: in bed;with family/visitor present;with call bell/phone within reach  GP     [facility] 03/25/2013, 12:38 PM [place], [place] 03/25/2013

## 2013-03-25 NOTE — Evaluation (Signed)
Occupational Therapy Evaluation Patient Details Name: Bradley [name] MRN: [id number] DOB: [birth year] Today's Date: 03/25/2013 Time: [id number] OT Time Calculation (min): 25 min  OT Assessment / Plan / Recommendation History of present illness Patient is a 67 y/o male admitted with spondylolisthesis at L5-S1 with marked foraminal encroachment and nerve root compression bilaterally more so on the left. Now s/p L5-S1 Gill procedure with decompression and PLIF.   Clinical Impression   Patient is s/p L5-s1 fusion surgery resulting in functional limitations due to the deficits listed below (see OT problem list). Pt with new onset of coughing. Pt provided spirometer and educated on using it Patient will benefit from skilled OT acutely to increase independence and safety with ADLS to allow discharge home with no therapy needs.     OT Assessment  Patient needs continued OT Services    Follow Up Recommendations  No OT follow up    Barriers to Discharge      Equipment Recommendations  None recommended by OT    Recommendations for Other Services    Frequency  Min 2X/week    Precautions / Restrictions Precautions Precautions: Back Required Braces or Orthoses: Spinal Brace Spinal Brace: Lumbar corset   Pertinent Vitals/Pain 7 out 10  Rn providing medication    ADL  Eating/Feeding: Modified independent Where Assessed - Eating/Feeding: Edge of bed Grooming: Teeth care;Wash/dry face;Modified independent Where Assessed - Grooming: Unsupported standing Lower Body Dressing: Moderate assistance Where Assessed - Lower Body Dressing: Unsupported sitting Toilet Transfer: Modified independent Toilet Transfer Method: Sit to stand Toilet Transfer Equipment: Regular height toilet Toileting - Clothing Manipulation and Hygiene: Modified independent Where Assessed - Toileting Clothing Manipulation and Hygiene: Sit to stand from 3-in-1 or toilet Equipment Used: Back brace;Rolling walker;Gait  belt Transfers/Ambulation Related to ADLs: Pt ambulated entire 4north unit >200 ft with RW supervision level.  ADL Comments: Pt educated on bed mobility. back precautions, toilet transfer, sink level grooming, spouse (A) with bed mobility and don /doff brace. Pt provided handout at end of session. Next session to focus on tub transfers and bed mobility with wife (A)    OT Diagnosis: Generalized weakness;Acute pain  OT Problem List: Decreased strength;Decreased activity tolerance;Impaired balance (sitting and/or standing);Decreased safety awareness;Decreased knowledge of use of DME or AE;Decreased knowledge of precautions;Pain OT Treatment Interventions: Self-care/ADL training;Therapeutic exercise;DME and/or AE instruction;Therapeutic activities;Patient/family education;Balance training   OT Goals(Current goals can be found in the care plan section) Acute Rehab OT Goals Patient Stated Goal: To go home tomorrow OT Goal Formulation: With patient Time For Goal Achievement: 04/08/13 Potential to Achieve Goals: Good  Visit Information  Last OT Received On: 03/25/13 Assistance Needed: +1 History of Present Illness: Patient is a 67 y/o male admitted with spondylolisthesis at L5-S1 with marked foraminal encroachment and nerve root compression bilaterally more so on the left. Now s/p L5-S1 Gill procedure with decompression and PLIF.       Prior [place] expects to be discharged to:: Private residence Living Arrangements: Spouse/significant other Available Help at Discharge: Family;Available 24 hours/day Type of Home: Apartment Home Access: Level entry Home Layout: One level Home Equipment: Cane - quad;Shower seat;Electric scooter Additional Comments: tub shower Prior Function Level of Independence: Independent with assistive device(s) Comments: walked independent few yards till left leg pain Communication Communication: No difficulties Dominant Hand:  Right         Vision/Perception Vision - History Baseline Vision: Wears glasses only for reading Patient Visual Report: No change from  baseline   Cognition  Cognition Arousal/Alertness: Awake/alert Behavior During Therapy: WFL for tasks assessed/performed Overall Cognitive Status: Within Functional Limits for tasks assessed    Extremity/Trunk Assessment Upper Extremity Assessment Upper Extremity Assessment: Overall WFL for tasks assessed Lower Extremity Assessment Lower Extremity Assessment: Overall WFL for tasks assessed Cervical / Trunk Assessment Cervical / Trunk Assessment: Normal     Mobility Bed Mobility Bed Mobility: Supine to Sit;Sitting - Scoot to Edge of Bed;Sit to Supine Supine to Sit: 3: Mod assist;HOB flat;With rails Sitting - Scoot to Edge of Bed: 4: Min guard;With rail Sit to Supine: 3: Mod assist;HOB flat;With rail Sit to Sidelying Left: 4: Min assist;HOB flat Scooting to HOB: With rail;4: Min guard Details for Bed Mobility Assistance: cues for sequence and back precautions. wife (A) with return to supine. Pt and wife need addition practice Transfers Transfers: Sit to Stand;Stand to Sit Sit to Stand: 4: Min guard;From bed;With upper extremity assist Stand to Sit: 4: Min guard;With upper extremity assist;To bed Details for Transfer Assistance: cues for hand placement     Exercise     Balance     End of Session OT - End of Session Activity Tolerance: Patient tolerated treatment well Patient left: in bed;with call bell/phone within reach;with family/visitor present Nurse Communication: Mobility status;Precautions  GO     Veneda Melter 03/25/2013, 4:16 PM Pager: [phone]

## 2013-03-25 NOTE — Progress Notes (Signed)
Utilization review completed.  

## 2013-03-25 NOTE — Progress Notes (Signed)
Patient ambulated in the hallway. Had some pain in Rt hip, Otherwise tolerated very well. Foley D/cd. Hemovac drained 150 ml during the shift.

## 2013-03-25 NOTE — Progress Notes (Signed)
Patient ID: Bradley [name], male   DOB: [birth year], 67 y.o.   MRN: [id number] Afeb,vss No new neuro issues Already ambulated around the hall Drain working fine. Only c/o incisional pain. Will increase activity further, hopefully home tomorrow.

## 2013-03-25 NOTE — Progress Notes (Signed)
Patient placed himself on cpap. Patient is tolerating cpap well at this time.

## 2013-03-26 LAB — GLUCOSE, CAPILLARY
Glucose-Capillary: 203 mg/dL — ABNORMAL HIGH (ref 70–99)
Glucose-Capillary: 230 mg/dL — ABNORMAL HIGH (ref 70–99)
Glucose-Capillary: 205 mg/dL — ABNORMAL HIGH (ref 70–99)
Glucose-Capillary: 237 mg/dL — ABNORMAL HIGH (ref 70–99)
Glucose-Capillary: 170 mg/dL — ABNORMAL HIGH (ref 70–99)

## 2013-03-26 MED ORDER — PANTOPRAZOLE SODIUM 40 MG PO TBEC
40.0000 mg | DELAYED_RELEASE_TABLET | Freq: Every day | ORAL | Status: DC
  Administered 2013-03-26 – 2013-03-27 (×2): 40 mg via ORAL
  Filled 2013-03-26 (×2): qty 1

## 2013-03-26 MED ORDER — HYDROMORPHONE HCL 2 MG PO TABS
4.0000 mg | ORAL_TABLET | ORAL | Status: DC | PRN
  Administered 2013-03-27 (×4): 4 mg via ORAL
  Filled 2013-03-26 (×4): qty 2

## 2013-03-26 NOTE — Progress Notes (Signed)
Patient ID: Bradley [name], male   DOB: [birth year], 67 y.o.   MRN: [id number] Ambulating, hemovac was removed, it was partially out. Wants to go home but wants to wait for his wife.

## 2013-03-26 NOTE — Progress Notes (Signed)
Physical Therapy Treatment Patient Details Name: Bradley [name] MRN: [id number] DOB: [birth year] Today's Date: 03/26/2013 Time: [id number] PT Time Calculation (min): 30 min  PT Assessment / Plan / Recommendation  History of Present Illness Patient is a 67 y/o male admitted with spondylolisthesis at L5-S1 with marked foraminal encroachment and nerve root compression bilaterally more so on the left. Now s/p L5-S1 Gill procedure with decompression and PLIF.   PT Comments   Pt is now POD #2 and is moving well, albeit slowly.  He proved that he can move around without much physical assist from his wife (she has a 40# lifting restrictions due to bil knee surgeries and back surgery) and reported to the MD that he is ready to go home today.    Follow Up Recommendations  Home health PT     Does the patient have the potential to tolerate intense rehabilitation    NA  Barriers to Discharge   None      Equipment Recommendations  Rolling walker with 5" wheels    Recommendations for Other Services   None  Frequency Min 5X/week   Progress towards PT Goals Progress towards PT goals: Progressing toward goals  Plan Current plan remains appropriate    Precautions / Restrictions Precautions Precautions: Back Precaution Comments: Pt able to report 3/3 back precautions and when to wear his brace correctly without cues.   Required Braces or Orthoses: Spinal Brace Spinal Brace: Lumbar corset   Pertinent Vitals/Pain See vitals flow sheet.    Mobility  Bed Mobility Bed Mobility: Rolling Right;Right Sidelying to Sit;Sit to Sidelying Right;Sitting - Scoot to Edge of Bed Rolling Right: 6: Modified independent (Device/Increase time) Right Sidelying to Sit: 5: Supervision;HOB flat Sitting - Scoot to Edge of Bed: 6: Modified independent (Device/Increase time) Sit to Sidelying Right: 5: Supervision;HOB flat Scooting to HOB: 6: Modified independent (Device/Increase time) Details for Bed Mobility Assistance:  Pt needed extra time to preform all bed mobility, but he was able to do it without external physical assistance.  Verbal cues to breathe while moving as pt has a tendancy to hold his breath.   Transfers Transfers: Sit to Stand;Stand to Sit Sit to Stand: 5: Supervision;With upper extremity assist;From bed;From elevated surface;From chair/3-in-1;With armrests Stand to Sit: 5: Supervision;With upper extremity assist;With armrests;To bed;To chair/3-in-1 Details for Transfer Assistance: supervision for safety, cues needed for safe hand placement during transitions.   Ambulation/Gait Ambulation/Gait Assistance: 5: Supervision Ambulation Distance (Feet): 100 Feet Assistive device: Rolling walker Ambulation/Gait Assistance Details: supervision for safety due to at times during gait pt would cough and bil knees would flex and get soft for a moment as if buckling.   Gait Pattern: Step-through pattern;Shuffle;Trunk flexed General Gait Details: Verbal cues for upright posture.   Stairs: Yes Stairs Assistance: 4: Min assist Stairs Assistance Details (indicate cue type and reason): min assist of wife to help hip lift and lower RW while going up and down a curb step.   Stair Management Technique: No rails;Step to pattern;Forwards;With walker Number of Stairs: 1      PT Goals (current goals can now be found in the care plan section) Acute Rehab PT Goals Patient Stated Goal: To go home tomorrow  Visit Information  Last PT Received On: 03/26/13 Assistance Needed: +1 History of Present Illness: Patient is a 67 y/o male admitted with spondylolisthesis at L5-S1 with marked foraminal encroachment and nerve root compression bilaterally more so on the left. Now s/p L5-S1 Gill procedure with decompression and PLIF.  Subjective Data  Subjective: Pt is trying to demonstrate that he can go home safely today Patient Stated Goal: To go home tomorrow   Cognition  Cognition Arousal/Alertness:  Awake/alert Behavior During Therapy: WFL for tasks assessed/performed Overall Cognitive Status: Within Functional Limits for tasks assessed       End of Session PT - End of Session Equipment Utilized During Treatment: Back brace Activity Tolerance: Patient limited by fatigue;Patient limited by pain Patient left: in chair;with call bell/phone within reach;with family/visitor present     Wells Guiles B. [place], [place], DPT [phone]   03/26/2013, 3:12 PM

## 2013-03-26 NOTE — Discharge Summary (Signed)
Physician Discharge Summary  Patient ID: Bradley [name] MRN: [id number] DOB/AGE: 09-27-1945 67 y.o.  Admit date: 03/24/2013 Discharge date: 03/26/2013  Admission Diagnoses:l5 s1 spondylolisthesis  Discharge Diagnoses: same  Active Problems:   * No active hospital problems. *   Discharged Condition: ambulating Hospital Course: surgery  Consults: none  Significant Diagnostic Studies: mri  Treatments:l5s1 fusion  Discharge Exam: Blood pressure 168/70, pulse 112, temperature 99.2 F (37.3 C), temperature source Oral, resp. rate 20, height 6' (1.829 m), weight 102.059 kg (225 lb), SpO2 94.00%. Ambulating, no weakness.   Disposition: home at his request. He will be taken dilaudid and diazepam   Future Appointments Provider Department Dept Phone   11/22/2013 2:00 PM [facility] [phone]   11/22/2013 3:00 PM [facility] [phone]   11/22/2013 3:30 PM Rosetta Posner, MD [facility] [phone]       Medication List    ASK your doctor about these medications       aspirin 81 MG tablet  Take 81 mg by mouth daily.     atorvastatin 40 MG tablet  Commonly known as:  LIPITOR  Take 40 mg by mouth daily.     Calcium-D 600-400 MG-UNIT Tabs  Take by mouth.     carvedilol 3.125 MG tablet  Commonly known as:  COREG  Take 3.125 mg by mouth 2 (two) times daily with a meal.     clopidogrel 75 MG tablet  Commonly known as:  PLAVIX  Take 75 mg by mouth daily.     D-3-5 5000 UNITS capsule  Generic drug:  Cholecalciferol  Take 5,000 Units by mouth daily.     glimepiride 2 MG tablet  Commonly known as:  AMARYL  Take 2 mg by mouth daily before breakfast.     lansoprazole 15 MG capsule  Commonly known as:  PREVACID  Take 30 mg by mouth daily.     losartan 100 MG tablet  Commonly known as:  COZAAR  Take 100 mg by mouth Daily.     metFORMIN 500 MG 24 hr  tablet  Commonly known as:  GLUCOPHAGE-XR  Take 1,000 mg by mouth 2 (two) times daily.     multivitamin capsule  Take 1 capsule by mouth daily.     niacin 500 MG CR tablet  Commonly known as:  NIASPAN  Take 500 mg by mouth at bedtime.     pioglitazone 30 MG tablet  Commonly known as:  ACTOS  Take 30 mg by mouth daily.     testosterone cypionate 200 MG/ML injection  Commonly known as:  DEPOTESTOTERONE CYPIONATE  Inject 100 mg into the muscle every 14 (fourteen) days. Pt gets 200mg /ml vial and injects only 0.7ml every two weeks     VICTOZA [place]  Inject 0.6 mLs into the skin daily.     vitamin E 400 UNIT capsule  Take 400 Units by mouth daily.         Signed: Floyce Stakes 03/26/2013, 11:43 AM

## 2013-03-26 NOTE — Progress Notes (Signed)
Occupational Therapy Treatment Patient Details Name: FURIOUS WRITER MRN: [id number] DOB: 1946/04/10 Today's Date: 03/26/2013 Time: [id number] [phone]) OT Time Calculation (min): 33 min  OT Assessment / Plan / Recommendation  History of present illness Patient is a 67 y/o male admitted with spondylolisthesis at L5-S1 with marked foraminal encroachment and nerve root compression bilaterally more so on the left. Now s/p L5-S1 Gill procedure with decompression and PLIF.   OT comments  Pt progressing toward goals and needed cues during session for safety. Pt will need wife present to bed level transfer and chair transfer to determine ability to d/c home today from OT standpoint.   Follow Up Recommendations  Home health OT;Supervision - Intermittent    Barriers to Discharge       Equipment Recommendations  3 in 1 bedside comode    Recommendations for Other Services    Frequency Min 2X/week   Progress towards OT Goals Progress towards OT goals: Progressing toward goals  Plan Discharge plan remains appropriate    Precautions / Restrictions Precautions Precautions: Back Required Braces or Orthoses: Spinal Brace Spinal Brace: Lumbar corset   Pertinent Vitals/Pain Discomfort at surgerical site     ADL  Eating/Feeding: Independent Where Assessed - Eating/Feeding: Chair Grooming: Wash/dry hands;Teeth care;Min guard Where Assessed - Grooming: Unsupported standing (cueing for safety) Lower Body Bathing: +1 Total assistance Where Assessed - Lower Body Bathing: Supported sitting (doff socks) Lower Body Dressing: Maximal assistance Where Assessed - Lower Body Dressing: Supported sitting Toilet Transfer: Min guard (cues for safety) Toilet Transfer Method: Sit to stand Toilet Transfer Equipment: Raised toilet seat with arms (or 3-in-1 over toilet) Toileting - Clothing Manipulation and Hygiene: Minimal assistance Where Assessed - Toileting Clothing Manipulation and Hygiene: Sit to stand  from 3-in-1 or toilet Equipment Used: Back brace;Rolling walker Transfers/Ambulation Related to ADLs: Pt ambulated the entire 4north unit > 200 ft with RW supervision vision level.  ADL Comments: Pt sitting in chair on arrival. pt requesting a change of position then requesting a walk. Pt assisted around unit reviewing precautions. pt requesting to go home today but states RN concerned with dc home. PT needs to practice transfers with wife to determine safety for d/c. Pt without void of bowels at this time. Pt completed toilet transfer and educated on urinal with 3n1. pt with poor return demo and spilling urine. Pt required total (A) to doff socks and don new ones. Pt with mod (A) for bathing hip to toes prior to don of new socks. Pt standing at sink to wash hands. Pt needed cues for using cup to prevent bending. Pt with back handout present in room.     OT Diagnosis:    OT Problem List:   OT Treatment Interventions:     OT Goals(current goals can now be found in the care plan section) Acute Rehab OT Goals Patient Stated Goal: To go home tomorrow OT Goal Formulation: With patient Time For Goal Achievement: 04/08/13 Potential to Achieve Goals: Good  Visit Information  Last OT Received On: 03/26/13 Assistance Needed: +1 History of Present Illness: Patient is a 67 y/o male admitted with spondylolisthesis at L5-S1 with marked foraminal encroachment and nerve root compression bilaterally more so on the left. Now s/p L5-S1 Gill procedure with decompression and PLIF.    Subjective Data      Prior Functioning       Cognition  Cognition Arousal/Alertness: Awake/alert Behavior During Therapy: WFL for tasks assessed/performed Overall Cognitive Status: Within Functional Limits for tasks assessed  Mobility  Bed Mobility Bed Mobility: Not assessed Transfers Transfers: Sit to Stand;Stand to Sit Sit to Stand: 4: Min guard;With upper extremity assist;From chair/3-in-1 Stand to Sit: 4: Min  guard;With upper extremity assist;To bed Details for Transfer Assistance: cues for safety and hand placement    Exercises      Balance     End of Session OT - End of Session Activity Tolerance: Patient tolerated treatment well Patient left: in chair;with call bell/phone within reach Nurse Communication: Mobility status;Precautions  GO     Veneda Melter 03/26/2013, 11:00 AM Pager: [phone]

## 2013-03-27 LAB — GLUCOSE, CAPILLARY
Glucose-Capillary: 147 mg/dL — ABNORMAL HIGH (ref 70–99)
Glucose-Capillary: 238 mg/dL — ABNORMAL HIGH (ref 70–99)
Glucose-Capillary: 182 mg/dL — ABNORMAL HIGH (ref 70–99)

## 2013-03-27 NOTE — Progress Notes (Signed)
Patient ID: Bradley [name], male   DOB: Sep 20, 1945, 67 y.o.   MRN: [id number] Pain better. Wanted to go home yesterday but decided at the last minute to stay overnight

## 2013-03-28 DIAGNOSIS — Z5189 Encounter for other specified aftercare: Secondary | ICD-10-CM | POA: Diagnosis not present

## 2013-03-28 DIAGNOSIS — E119 Type 2 diabetes mellitus without complications: Secondary | ICD-10-CM | POA: Diagnosis not present

## 2013-03-28 DIAGNOSIS — Z4789 Encounter for other orthopedic aftercare: Secondary | ICD-10-CM | POA: Diagnosis not present

## 2013-03-28 DIAGNOSIS — Z794 Long term (current) use of insulin: Secondary | ICD-10-CM | POA: Diagnosis not present

## 2013-03-28 DIAGNOSIS — I739 Peripheral vascular disease, unspecified: Secondary | ICD-10-CM | POA: Diagnosis not present

## 2013-03-28 NOTE — Progress Notes (Signed)
03/27/2013 1700 Offered choice. Spoke to pt and [facility] was chosen for [facility]. Faxed referral to [facility]. Pt did not need any DME at this time. Jonnie Finner RN CCM Case Mgmt phone [phone]

## 2013-03-28 NOTE — Care Management Note (Signed)
    Page 1 of 2   03/28/2013     11:16:08 AM   CARE MANAGEMENT NOTE 03/28/2013  Patient:  AUTRY, ZAREK   Account Number:  [id number]  Date Initiated:  03/28/2013  Documentation initiated by:  Lorne Skeens  Subjective/Objective Assessment:   Patient was admitted for lumbar surgery. Lives at home with wife.     Action/Plan:   Discharge planning.   Anticipated DC Date:  03/27/2013   Anticipated DC Plan:  [place]  CM consult      Choice offered to / List presented to:     DME arranged  [place]  3-N-1      DME agency  [place] arranged  [place].   Status of service:  Completed, signed off Medicare Important Message given?   (If response is "NO", the following Medicare IM given date fields will be blank) Date Medicare IM given:   Date Additional Medicare IM given:    Discharge Disposition:  [place]  Per UR Regulation:  Reviewed for med. necessity/level of care/duration of stay  If discussed at [place] of Stay Meetings, dates discussed:    Comments:  03/28/13 [place], MSN, [place] with patient's wife to verify that home health had been set up over the weekend.  Per wife, [facility] has made an appointment and will be coming to the house later today.

## 2013-03-30 DIAGNOSIS — E119 Type 2 diabetes mellitus without complications: Secondary | ICD-10-CM | POA: Diagnosis not present

## 2013-03-30 DIAGNOSIS — Z794 Long term (current) use of insulin: Secondary | ICD-10-CM | POA: Diagnosis not present

## 2013-03-30 DIAGNOSIS — Z4789 Encounter for other orthopedic aftercare: Secondary | ICD-10-CM | POA: Diagnosis not present

## 2013-03-30 DIAGNOSIS — I739 Peripheral vascular disease, unspecified: Secondary | ICD-10-CM | POA: Diagnosis not present

## 2013-03-30 DIAGNOSIS — Z5189 Encounter for other specified aftercare: Secondary | ICD-10-CM | POA: Diagnosis not present

## 2013-04-01 DIAGNOSIS — Z794 Long term (current) use of insulin: Secondary | ICD-10-CM | POA: Diagnosis not present

## 2013-04-01 DIAGNOSIS — I739 Peripheral vascular disease, unspecified: Secondary | ICD-10-CM | POA: Diagnosis not present

## 2013-04-01 DIAGNOSIS — E119 Type 2 diabetes mellitus without complications: Secondary | ICD-10-CM | POA: Diagnosis not present

## 2013-04-01 DIAGNOSIS — Z5189 Encounter for other specified aftercare: Secondary | ICD-10-CM | POA: Diagnosis not present

## 2013-04-01 DIAGNOSIS — Z4789 Encounter for other orthopedic aftercare: Secondary | ICD-10-CM | POA: Diagnosis not present

## 2013-04-04 DIAGNOSIS — Z5189 Encounter for other specified aftercare: Secondary | ICD-10-CM | POA: Diagnosis not present

## 2013-04-04 DIAGNOSIS — I739 Peripheral vascular disease, unspecified: Secondary | ICD-10-CM | POA: Diagnosis not present

## 2013-04-04 DIAGNOSIS — Z794 Long term (current) use of insulin: Secondary | ICD-10-CM | POA: Diagnosis not present

## 2013-04-04 DIAGNOSIS — Z4789 Encounter for other orthopedic aftercare: Secondary | ICD-10-CM | POA: Diagnosis not present

## 2013-04-04 DIAGNOSIS — E119 Type 2 diabetes mellitus without complications: Secondary | ICD-10-CM | POA: Diagnosis not present

## 2013-04-06 DIAGNOSIS — Z794 Long term (current) use of insulin: Secondary | ICD-10-CM | POA: Diagnosis not present

## 2013-04-06 DIAGNOSIS — E119 Type 2 diabetes mellitus without complications: Secondary | ICD-10-CM | POA: Diagnosis not present

## 2013-04-06 DIAGNOSIS — I739 Peripheral vascular disease, unspecified: Secondary | ICD-10-CM | POA: Diagnosis not present

## 2013-04-06 DIAGNOSIS — Z5189 Encounter for other specified aftercare: Secondary | ICD-10-CM | POA: Diagnosis not present

## 2013-04-06 DIAGNOSIS — Z4789 Encounter for other orthopedic aftercare: Secondary | ICD-10-CM | POA: Diagnosis not present

## 2013-04-12 DIAGNOSIS — Z794 Long term (current) use of insulin: Secondary | ICD-10-CM | POA: Diagnosis not present

## 2013-04-12 DIAGNOSIS — E119 Type 2 diabetes mellitus without complications: Secondary | ICD-10-CM | POA: Diagnosis not present

## 2013-04-12 DIAGNOSIS — Z4789 Encounter for other orthopedic aftercare: Secondary | ICD-10-CM | POA: Diagnosis not present

## 2013-04-12 DIAGNOSIS — I739 Peripheral vascular disease, unspecified: Secondary | ICD-10-CM | POA: Diagnosis not present

## 2013-04-12 DIAGNOSIS — Z5189 Encounter for other specified aftercare: Secondary | ICD-10-CM | POA: Diagnosis not present

## 2013-04-14 DIAGNOSIS — I739 Peripheral vascular disease, unspecified: Secondary | ICD-10-CM | POA: Diagnosis not present

## 2013-04-14 DIAGNOSIS — Z5189 Encounter for other specified aftercare: Secondary | ICD-10-CM | POA: Diagnosis not present

## 2013-04-14 DIAGNOSIS — E119 Type 2 diabetes mellitus without complications: Secondary | ICD-10-CM | POA: Diagnosis not present

## 2013-04-14 DIAGNOSIS — Z4789 Encounter for other orthopedic aftercare: Secondary | ICD-10-CM | POA: Diagnosis not present

## 2013-04-14 DIAGNOSIS — Z794 Long term (current) use of insulin: Secondary | ICD-10-CM | POA: Diagnosis not present

## 2013-04-27 DIAGNOSIS — M48061 Spinal stenosis, lumbar region without neurogenic claudication: Secondary | ICD-10-CM | POA: Diagnosis not present

## 2013-04-27 DIAGNOSIS — Q762 Congenital spondylolisthesis: Secondary | ICD-10-CM | POA: Diagnosis not present

## 2013-05-12 DIAGNOSIS — L570 Actinic keratosis: Secondary | ICD-10-CM | POA: Diagnosis not present

## 2013-05-12 DIAGNOSIS — L819 Disorder of pigmentation, unspecified: Secondary | ICD-10-CM | POA: Diagnosis not present

## 2013-05-12 DIAGNOSIS — Z85828 Personal history of other malignant neoplasm of skin: Secondary | ICD-10-CM | POA: Diagnosis not present

## 2013-05-26 DIAGNOSIS — E291 Testicular hypofunction: Secondary | ICD-10-CM | POA: Diagnosis not present

## 2013-05-26 DIAGNOSIS — IMO0001 Reserved for inherently not codable concepts without codable children: Secondary | ICD-10-CM | POA: Diagnosis not present

## 2013-05-26 DIAGNOSIS — Z23 Encounter for immunization: Secondary | ICD-10-CM | POA: Diagnosis not present

## 2013-05-26 DIAGNOSIS — I10 Essential (primary) hypertension: Secondary | ICD-10-CM | POA: Diagnosis not present

## 2013-06-03 DIAGNOSIS — IMO0001 Reserved for inherently not codable concepts without codable children: Secondary | ICD-10-CM | POA: Diagnosis not present

## 2013-06-03 DIAGNOSIS — Z79899 Other long term (current) drug therapy: Secondary | ICD-10-CM | POA: Diagnosis not present

## 2013-06-03 DIAGNOSIS — R112 Nausea with vomiting, unspecified: Secondary | ICD-10-CM | POA: Diagnosis not present

## 2013-06-03 DIAGNOSIS — D649 Anemia, unspecified: Secondary | ICD-10-CM | POA: Diagnosis not present

## 2013-06-03 DIAGNOSIS — K3189 Other diseases of stomach and duodenum: Secondary | ICD-10-CM | POA: Diagnosis not present

## 2013-06-03 DIAGNOSIS — E291 Testicular hypofunction: Secondary | ICD-10-CM | POA: Diagnosis not present

## 2013-06-06 DIAGNOSIS — M48061 Spinal stenosis, lumbar region without neurogenic claudication: Secondary | ICD-10-CM | POA: Diagnosis not present

## 2013-06-07 DIAGNOSIS — Z1211 Encounter for screening for malignant neoplasm of colon: Secondary | ICD-10-CM | POA: Diagnosis not present

## 2013-06-15 DIAGNOSIS — D500 Iron deficiency anemia secondary to blood loss (chronic): Secondary | ICD-10-CM | POA: Diagnosis not present

## 2013-06-15 DIAGNOSIS — K921 Melena: Secondary | ICD-10-CM | POA: Diagnosis not present

## 2013-06-15 DIAGNOSIS — K219 Gastro-esophageal reflux disease without esophagitis: Secondary | ICD-10-CM | POA: Diagnosis not present

## 2013-06-15 DIAGNOSIS — Z86010 Personal history of colonic polyps: Secondary | ICD-10-CM | POA: Diagnosis not present

## 2013-06-16 ENCOUNTER — Other Ambulatory Visit: Payer: Self-pay

## 2013-06-23 DIAGNOSIS — K2940 Chronic atrophic gastritis without bleeding: Secondary | ICD-10-CM | POA: Diagnosis not present

## 2013-06-23 DIAGNOSIS — K222 Esophageal obstruction: Secondary | ICD-10-CM | POA: Diagnosis not present

## 2013-06-23 DIAGNOSIS — Q398 Other congenital malformations of esophagus: Secondary | ICD-10-CM | POA: Diagnosis not present

## 2013-06-23 DIAGNOSIS — R1310 Dysphagia, unspecified: Secondary | ICD-10-CM | POA: Diagnosis not present

## 2013-06-23 DIAGNOSIS — R195 Other fecal abnormalities: Secondary | ICD-10-CM | POA: Diagnosis not present

## 2013-06-23 DIAGNOSIS — D649 Anemia, unspecified: Secondary | ICD-10-CM | POA: Diagnosis not present

## 2013-06-30 ENCOUNTER — Other Ambulatory Visit: Payer: Self-pay | Admitting: Gastroenterology

## 2013-06-30 DIAGNOSIS — K921 Melena: Secondary | ICD-10-CM

## 2013-06-30 DIAGNOSIS — R634 Abnormal weight loss: Secondary | ICD-10-CM

## 2013-06-30 DIAGNOSIS — R109 Unspecified abdominal pain: Secondary | ICD-10-CM

## 2013-07-01 DIAGNOSIS — I10 Essential (primary) hypertension: Secondary | ICD-10-CM | POA: Diagnosis not present

## 2013-07-01 DIAGNOSIS — K2980 Duodenitis without bleeding: Secondary | ICD-10-CM | POA: Diagnosis not present

## 2013-07-01 DIAGNOSIS — E119 Type 2 diabetes mellitus without complications: Secondary | ICD-10-CM | POA: Diagnosis not present

## 2013-07-04 ENCOUNTER — Ambulatory Visit
Admission: RE | Admit: 2013-07-04 | Discharge: 2013-07-04 | Disposition: A | Discharge status: Home / Self Care | Payer: Medicare Other | Source: Ambulatory Visit | Attending: Gastroenterology | Admitting: Gastroenterology

## 2013-07-04 DIAGNOSIS — K8020 Calculus of gallbladder without cholecystitis without obstruction: Secondary | ICD-10-CM | POA: Diagnosis not present

## 2013-07-04 DIAGNOSIS — R634 Abnormal weight loss: Secondary | ICD-10-CM

## 2013-07-04 DIAGNOSIS — R109 Unspecified abdominal pain: Secondary | ICD-10-CM

## 2013-07-04 DIAGNOSIS — K5730 Diverticulosis of large intestine without perforation or abscess without bleeding: Secondary | ICD-10-CM | POA: Diagnosis not present

## 2013-07-04 DIAGNOSIS — K921 Melena: Secondary | ICD-10-CM

## 2013-07-04 MED ORDER — IOHEXOL 300 MG/ML  SOLN
125.0000 mL | Freq: Once | INTRAMUSCULAR | Status: AC | PRN
  Administered 2013-07-04: 125 mL via INTRAVENOUS

## 2013-07-12 DIAGNOSIS — E291 Testicular hypofunction: Secondary | ICD-10-CM | POA: Diagnosis not present

## 2013-07-19 DIAGNOSIS — E291 Testicular hypofunction: Secondary | ICD-10-CM | POA: Diagnosis not present

## 2013-07-19 DIAGNOSIS — R5381 Other malaise: Secondary | ICD-10-CM | POA: Diagnosis not present

## 2013-07-20 DIAGNOSIS — Q762 Congenital spondylolisthesis: Secondary | ICD-10-CM | POA: Diagnosis not present

## 2013-07-20 DIAGNOSIS — E663 Overweight: Secondary | ICD-10-CM | POA: Diagnosis not present

## 2013-07-22 DIAGNOSIS — R933 Abnormal findings on diagnostic imaging of other parts of digestive tract: Secondary | ICD-10-CM | POA: Diagnosis not present

## 2013-07-22 DIAGNOSIS — R1310 Dysphagia, unspecified: Secondary | ICD-10-CM | POA: Diagnosis not present

## 2013-07-22 DIAGNOSIS — D500 Iron deficiency anemia secondary to blood loss (chronic): Secondary | ICD-10-CM | POA: Diagnosis not present

## 2013-07-22 DIAGNOSIS — K921 Melena: Secondary | ICD-10-CM | POA: Diagnosis not present

## 2013-08-06 DIAGNOSIS — A088 Other specified intestinal infections: Secondary | ICD-10-CM | POA: Diagnosis not present

## 2013-08-17 DIAGNOSIS — G609 Hereditary and idiopathic neuropathy, unspecified: Secondary | ICD-10-CM | POA: Diagnosis not present

## 2013-08-17 DIAGNOSIS — I10 Essential (primary) hypertension: Secondary | ICD-10-CM | POA: Diagnosis not present

## 2013-08-17 DIAGNOSIS — E7800 Pure hypercholesterolemia, unspecified: Secondary | ICD-10-CM | POA: Diagnosis not present

## 2013-08-17 DIAGNOSIS — K219 Gastro-esophageal reflux disease without esophagitis: Secondary | ICD-10-CM | POA: Diagnosis not present

## 2013-08-17 DIAGNOSIS — M949 Disorder of cartilage, unspecified: Secondary | ICD-10-CM | POA: Diagnosis not present

## 2013-08-17 DIAGNOSIS — M899 Disorder of bone, unspecified: Secondary | ICD-10-CM | POA: Diagnosis not present

## 2013-08-17 DIAGNOSIS — K5732 Diverticulitis of large intestine without perforation or abscess without bleeding: Secondary | ICD-10-CM | POA: Diagnosis not present

## 2013-08-17 DIAGNOSIS — IMO0001 Reserved for inherently not codable concepts without codable children: Secondary | ICD-10-CM | POA: Diagnosis not present

## 2013-08-17 DIAGNOSIS — I679 Cerebrovascular disease, unspecified: Secondary | ICD-10-CM | POA: Diagnosis not present

## 2013-09-14 ENCOUNTER — Other Ambulatory Visit: Payer: Self-pay | Admitting: Vascular Surgery

## 2013-09-14 DIAGNOSIS — Z48812 Encounter for surgical aftercare following surgery on the circulatory system: Secondary | ICD-10-CM

## 2013-09-14 DIAGNOSIS — I6529 Occlusion and stenosis of unspecified carotid artery: Secondary | ICD-10-CM

## 2013-09-19 DIAGNOSIS — K921 Melena: Secondary | ICD-10-CM | POA: Diagnosis not present

## 2013-10-24 DIAGNOSIS — D509 Iron deficiency anemia, unspecified: Secondary | ICD-10-CM | POA: Diagnosis not present

## 2013-10-26 DIAGNOSIS — K3189 Other diseases of stomach and duodenum: Secondary | ICD-10-CM | POA: Diagnosis not present

## 2013-10-26 DIAGNOSIS — R933 Abnormal findings on diagnostic imaging of other parts of digestive tract: Secondary | ICD-10-CM | POA: Diagnosis not present

## 2013-10-26 DIAGNOSIS — K219 Gastro-esophageal reflux disease without esophagitis: Secondary | ICD-10-CM | POA: Diagnosis not present

## 2013-10-26 DIAGNOSIS — R1013 Epigastric pain: Secondary | ICD-10-CM | POA: Diagnosis not present

## 2013-10-26 DIAGNOSIS — R1310 Dysphagia, unspecified: Secondary | ICD-10-CM | POA: Diagnosis not present

## 2013-11-02 DIAGNOSIS — H2510 Age-related nuclear cataract, unspecified eye: Secondary | ICD-10-CM | POA: Diagnosis not present

## 2013-11-02 DIAGNOSIS — E119 Type 2 diabetes mellitus without complications: Secondary | ICD-10-CM | POA: Diagnosis not present

## 2013-11-08 DIAGNOSIS — N400 Enlarged prostate without lower urinary tract symptoms: Secondary | ICD-10-CM | POA: Diagnosis not present

## 2013-11-08 DIAGNOSIS — Z125 Encounter for screening for malignant neoplasm of prostate: Secondary | ICD-10-CM | POA: Diagnosis not present

## 2013-11-17 DIAGNOSIS — R351 Nocturia: Secondary | ICD-10-CM | POA: Diagnosis not present

## 2013-11-17 DIAGNOSIS — N400 Enlarged prostate without lower urinary tract symptoms: Secondary | ICD-10-CM | POA: Diagnosis not present

## 2013-11-22 ENCOUNTER — Encounter (HOSPITAL_COMMUNITY): Payer: Medicare Other

## 2013-11-22 ENCOUNTER — Ambulatory Visit: Payer: Medicare Other | Admitting: Vascular Surgery

## 2013-11-22 ENCOUNTER — Other Ambulatory Visit (HOSPITAL_COMMUNITY): Payer: Medicare Other

## 2013-12-12 ENCOUNTER — Emergency Department (HOSPITAL_COMMUNITY): Payer: Medicare Other

## 2013-12-12 ENCOUNTER — Emergency Department (HOSPITAL_COMMUNITY)
Admission: EM | Admit: 2013-12-12 | Discharge: 2013-12-12 | Disposition: A | Discharge status: Home / Self Care | Payer: Medicare Other | Attending: Emergency Medicine | Admitting: Emergency Medicine

## 2013-12-12 ENCOUNTER — Encounter (HOSPITAL_COMMUNITY): Payer: Self-pay | Admitting: Emergency Medicine

## 2013-12-12 DIAGNOSIS — Z8669 Personal history of other diseases of the nervous system and sense organs: Secondary | ICD-10-CM | POA: Diagnosis not present

## 2013-12-12 DIAGNOSIS — M129 Arthropathy, unspecified: Secondary | ICD-10-CM | POA: Insufficient documentation

## 2013-12-12 DIAGNOSIS — I252 Old myocardial infarction: Secondary | ICD-10-CM | POA: Insufficient documentation

## 2013-12-12 DIAGNOSIS — K2970 Gastritis, unspecified, without bleeding: Secondary | ICD-10-CM | POA: Diagnosis not present

## 2013-12-12 DIAGNOSIS — K219 Gastro-esophageal reflux disease without esophagitis: Secondary | ICD-10-CM | POA: Diagnosis not present

## 2013-12-12 DIAGNOSIS — E119 Type 2 diabetes mellitus without complications: Secondary | ICD-10-CM | POA: Insufficient documentation

## 2013-12-12 DIAGNOSIS — N200 Calculus of kidney: Secondary | ICD-10-CM | POA: Diagnosis not present

## 2013-12-12 DIAGNOSIS — R1013 Epigastric pain: Secondary | ICD-10-CM | POA: Insufficient documentation

## 2013-12-12 DIAGNOSIS — Z880 Allergy status to penicillin: Secondary | ICD-10-CM | POA: Insufficient documentation

## 2013-12-12 DIAGNOSIS — K2900 Acute gastritis without bleeding: Secondary | ICD-10-CM | POA: Diagnosis not present

## 2013-12-12 DIAGNOSIS — K2990 Gastroduodenitis, unspecified, without bleeding: Secondary | ICD-10-CM | POA: Diagnosis not present

## 2013-12-12 DIAGNOSIS — Z8673 Personal history of transient ischemic attack (TIA), and cerebral infarction without residual deficits: Secondary | ICD-10-CM | POA: Insufficient documentation

## 2013-12-12 DIAGNOSIS — Z7902 Long term (current) use of antithrombotics/antiplatelets: Secondary | ICD-10-CM | POA: Insufficient documentation

## 2013-12-12 DIAGNOSIS — Z79899 Other long term (current) drug therapy: Secondary | ICD-10-CM | POA: Insufficient documentation

## 2013-12-12 DIAGNOSIS — Z7982 Long term (current) use of aspirin: Secondary | ICD-10-CM | POA: Diagnosis not present

## 2013-12-12 DIAGNOSIS — E785 Hyperlipidemia, unspecified: Secondary | ICD-10-CM | POA: Diagnosis not present

## 2013-12-12 DIAGNOSIS — Z87891 Personal history of nicotine dependence: Secondary | ICD-10-CM | POA: Diagnosis not present

## 2013-12-12 DIAGNOSIS — I10 Essential (primary) hypertension: Secondary | ICD-10-CM | POA: Insufficient documentation

## 2013-12-12 DIAGNOSIS — K8020 Calculus of gallbladder without cholecystitis without obstruction: Secondary | ICD-10-CM | POA: Diagnosis not present

## 2013-12-12 DIAGNOSIS — G4730 Sleep apnea, unspecified: Secondary | ICD-10-CM | POA: Diagnosis not present

## 2013-12-12 LAB — BASIC METABOLIC PANEL
BUN: 24 mg/dL — ABNORMAL HIGH (ref 6–23)
CO2: 23 mEq/L (ref 19–32)
Calcium: 9.4 mg/dL (ref 8.4–10.5)
Chloride: 98 mEq/L (ref 96–112)
Creatinine, Ser: 1.44 mg/dL — ABNORMAL HIGH (ref 0.50–1.35)
GFR calc Af Amer: 56 mL/min — ABNORMAL LOW (ref >90)
GFR calc non Af Amer: 48 mL/min — ABNORMAL LOW (ref >90)
Glucose, Bld: 201 mg/dL — ABNORMAL HIGH (ref 70–99)
Potassium: 4.2 mEq/L (ref 3.7–5.3)
Sodium: 137 mEq/L (ref 137–147)

## 2013-12-12 LAB — I-STAT TROPONIN, ED: Troponin i, poc: 0 ng/mL (ref 0.00–0.08)

## 2013-12-12 LAB — CBC
HCT: 39.6 % (ref 39.0–52.0)
Hemoglobin: 13.4 g/dL (ref 13.0–17.0)
MCH: 33.3 pg (ref 26.0–34.0)
MCHC: 33.8 g/dL (ref 30.0–36.0)
MCV: 98.3 fL (ref 78.0–100.0)
Platelets: 229 10*3/uL (ref 150–400)
RBC: 4.03 MIL/uL — ABNORMAL LOW (ref 4.22–5.81)
RDW: 13.5 % (ref 11.5–15.5)
WBC: 8.2 10*3/uL (ref 4.0–10.5)

## 2013-12-12 LAB — HEPATIC FUNCTION PANEL
ALT: 23 U/L (ref 0–53)
AST: 19 U/L (ref 0–37)
Albumin: 3.4 g/dL — ABNORMAL LOW (ref 3.5–5.2)
Alkaline Phosphatase: 60 U/L (ref 39–117)
Bilirubin, Direct: <0.2 mg/dL (ref 0.0–0.3)
Total Bilirubin: 0.3 mg/dL (ref 0.3–1.2)
Total Protein: 7 g/dL (ref 6.0–8.3)

## 2013-12-12 LAB — LIPASE, BLOOD: Lipase: 43 U/L (ref 11–59)

## 2013-12-12 MED ORDER — IOHEXOL 300 MG/ML  SOLN
100.0000 mL | Freq: Once | INTRAMUSCULAR | Status: AC | PRN
  Administered 2013-12-12: 100 mL via INTRAVENOUS

## 2013-12-12 MED ORDER — SODIUM CHLORIDE 0.9 % IV BOLUS (SEPSIS)
500.0000 mL | Freq: Once | INTRAVENOUS | Status: AC
  Administered 2013-12-12: 500 mL via INTRAVENOUS

## 2013-12-12 MED ORDER — SUCRALFATE 1 G PO TABS: 1.0000 g | ORAL_TABLET | Freq: Three times a day (TID) | ORAL | Status: DC

## 2013-12-12 MED ORDER — GI COCKTAIL ~~LOC~~
30.0000 mL | Freq: Once | ORAL | Status: AC
  Administered 2013-12-12: 30 mL via ORAL
  Filled 2013-12-12: qty 30

## 2013-12-12 MED ORDER — IOHEXOL 300 MG/ML  SOLN
20.0000 mL | INTRAMUSCULAR | Status: AC
  Administered 2013-12-12: 20 mL via ORAL

## 2013-12-12 MED ORDER — PANTOPRAZOLE SODIUM 40 MG IV SOLR
40.0000 mg | Freq: Once | INTRAVENOUS | Status: AC
  Administered 2013-12-12: 40 mg via INTRAVENOUS
  Filled 2013-12-12: qty 40

## 2013-12-12 NOTE — ED Notes (Signed)
Pt in c/o epigastric pain and abdominal pain, states pain is worse when eating or drinking anything, also n/v/d, pain increased with movement, symptoms since Wednesday, states this has happened before but never this bad- at times pain radiates into his back

## 2013-12-12 NOTE — ED Notes (Signed)
Pt comfortable with discharge and follow up instructions. Prescriptions x1 

## 2013-12-12 NOTE — ED Notes (Signed)
Pt finished drinking contrast, CT notified 

## 2013-12-12 NOTE — Discharge Instructions (Signed)

## 2013-12-12 NOTE — ED Provider Notes (Addendum)
CSN: [id number]     Arrival date & time 12/12/13  1444 History   First MD Initiated Contact with Patient 12/12/13 2035     Chief Complaint  Patient presents with  . Chest Pain  . Abdominal Pain     (Consider location/radiation/quality/duration/timing/severity/associated sxs/prior Treatment) HPI Comments: Patient presents with abdominal pain. He states he's had pain for the last 3-4 days in his upper abdomen. It radiates into his back. It's worse after he. He describes it as a crampy burning pain. He's also had some loose stools. Had some nausea and nonbloody, nonbilious emesis. He denies he fevers or chills. He has had a history of gastroesophageal reflux disease and hiatal hernia. He states he's had a EGD not too long ago that was normal. He denies any chest pain or shortness of breath. Denies any fevers or chills. He denies any blood in his stool or melena. He's been using antacids without relief.  Patient is a 68 y.o. male presenting with chest pain and abdominal pain.  Chest Pain Associated symptoms: abdominal pain, nausea and vomiting   Associated symptoms: no back pain, no cough, no diaphoresis, no dizziness, no fatigue, no fever, no headache, no numbness, no shortness of breath and no weakness   Abdominal Pain Associated symptoms: diarrhea, nausea and vomiting   Associated symptoms: no chest pain, no chills, no cough, no fatigue, no fever, no hematuria and no shortness of breath     Past Medical History  Diagnosis Date  . Hyperlipidemia   . Hypertension   . Diabetes mellitus age 34  . Peripheral vascular disease   . Arthritis   . Reflux esophagitis   . Stroke 1987    Right brain stroke  . Neuropathy 2013  . Osteoporosis 2013  . Sleep apnea     uses CPAP  . GERD (gastroesophageal reflux disease)   . H/O hiatal hernia   . Irritable bowel syndrome (IBS)   . Myocardial infarction     silent inferior MI; patient denies MI history (03/17/13)    Past Surgical History   Procedure Laterality Date  . Carotid endarterectomy  1994 & redo 2001    Left  . Pr vein bypass graft,aorto-fem-pop  1998  . Iliac artery stent    . Fracture surgery  2013    Right   foo  t X's 2  . Tonsillectomy     Family History  Problem Relation Age of Onset  . Cancer Mother 27    pancreatic  . Stroke Father 77  . Deep vein thrombosis Father    History  Substance Use Topics  . Smoking status: Former Smoker -- 1.00 packs/day for 40 years    Types: Cigarettes    Quit date: 03/26/2009  . Smokeless tobacco: Never Used  . Alcohol Use: No    Review of Systems  Constitutional: Negative for fever, chills, diaphoresis and fatigue.  HENT: Negative for congestion, rhinorrhea and sneezing.   Eyes: Negative.   Respiratory: Negative for cough, chest tightness and shortness of breath.   Cardiovascular: Negative for chest pain and leg swelling.  Gastrointestinal: Positive for nausea, vomiting, abdominal pain and diarrhea. Negative for blood in stool.  Genitourinary: Negative for frequency, hematuria, flank pain and difficulty urinating.  Musculoskeletal: Negative for arthralgias and back pain.  Skin: Negative for rash.  Neurological: Negative for dizziness, speech difficulty, weakness, numbness and headaches.      Allergies  Dilaudid cough; Food; Hydrocodone; Oxycodone; Ultram; Penicillins; and Sulfa drugs cross reactors  Home Medications   Prior to Admission medications   Medication Sig Start Date End Date Taking? Authorizing Provider  aspirin 81 MG tablet Take 81 mg by mouth daily.     Yes Historical Provider, MD  atorvastatin (LIPITOR) 40 MG tablet Take 40 mg by mouth daily.     Yes Historical Provider, MD  Calcium Carbonate-Vitamin D (CALCIUM-D) 600-400 MG-UNIT TABS Take by mouth.   Yes Historical Provider, MD  carvedilol (COREG) 3.125 MG tablet Take 3.125 mg by mouth 2 (two) times daily with a meal.   Yes Historical Provider, MD  Cholecalciferol (D-3-5) 5000 UNITS  capsule Take 5,000 Units by mouth daily.   Yes Historical Provider, MD  clopidogrel (PLAVIX) 75 MG tablet Take 75 mg by mouth daily.     Yes Historical Provider, MD  dexlansoprazole (DEXILANT) 60 MG capsule Take 60 mg by mouth daily.   Yes Historical Provider, MD  glimepiride (AMARYL) 2 MG tablet Take 2 mg by mouth daily before breakfast.     Yes Historical Provider, MD  Liraglutide (VICTOZA [place]) Inject 0.6 mLs into the skin daily.   Yes Historical Provider, MD  losartan (COZAAR) 100 MG tablet Take 100 mg by mouth Daily. 09/08/11  Yes Historical Provider, MD  metFORMIN (GLUCOPHAGE-XR) 500 MG 24 hr tablet Take 1,000 mg by mouth 2 (two) times daily.    Yes Historical Provider, MD  Multiple Vitamin (MULTIVITAMIN) capsule Take 1 capsule by mouth daily.     Yes Historical Provider, MD  niacin (NIASPAN) 500 MG CR tablet Take 500 mg by mouth at bedtime.     Yes Historical Provider, MD  pantoprazole (PROTONIX) 40 MG tablet Take 40 mg by mouth daily.   Yes Historical Provider, MD  vitamin E 400 UNIT capsule Take 400 Units by mouth daily.     Yes Historical Provider, MD   BP 114/69  Pulse 87  Temp(Src) 97.6 F (36.4 C) (Oral)  Resp 15  Wt 220 lb (99.791 kg)  SpO2 100% Physical Exam  Constitutional: He is oriented to person, place, and time. He appears well-developed and well-nourished.  HENT:  Head: Normocephalic and atraumatic.  Eyes: Pupils are equal, round, and reactive to light.  Neck: Normal range of motion. Neck supple.  Cardiovascular: Normal rate, regular rhythm and normal heart sounds.   Pulmonary/Chest: Effort normal and breath sounds normal. No respiratory distress. He has no wheezes. He has no rales. He exhibits no tenderness.  Abdominal: Soft. Bowel sounds are normal. There is tenderness (moderate tenderness to the left upper and lower quadrants). There is no rebound and no guarding.  Musculoskeletal: Normal range of motion. He exhibits no edema.  Lymphadenopathy:    He has no cervical  adenopathy.  Neurological: He is alert and oriented to person, place, and time.  Skin: Skin is warm and dry. No rash noted.  Psychiatric: He has a normal mood and affect.    ED Course  Procedures (including critical care time) Labs Review Results for orders placed during the hospital encounter of 12/12/13  CBC      Result Value Ref Range   WBC 8.2  4.0 - 10.5 K/uL   RBC 4.03 (*) 4.22 - 5.81 MIL/uL   Hemoglobin 13.4  13.0 - 17.0 g/dL   HCT 39.6  39.0 - 52.0 %   MCV 98.3  78.0 - 100.0 fL   MCH 33.3  26.0 - 34.0 pg   MCHC 33.8  30.0 - 36.0 g/dL   RDW 13.5  11.5 -  15.5 %   Platelets 229  150 - 400 K/uL  BASIC METABOLIC PANEL      Result Value Ref Range   Sodium 137  137 - 147 mEq/L   Potassium 4.2  3.7 - 5.3 mEq/L   Chloride 98  96 - 112 mEq/L   CO2 23  19 - 32 mEq/L   Glucose, Bld 201 (*) 70 - 99 mg/dL   BUN 24 (*) 6 - 23 mg/dL   Creatinine, Ser 1.44 (*) 0.50 - 1.35 mg/dL   Calcium 9.4  8.4 - 10.5 mg/dL   GFR calc non Af Amer 48 (*) >90 mL/min   GFR calc Af Amer 56 (*) >90 mL/min  LIPASE, BLOOD      Result Value Ref Range   Lipase 43  11 - 59 U/L  HEPATIC FUNCTION PANEL      Result Value Ref Range   Total Protein 7.0  6.0 - 8.3 g/dL   Albumin 3.4 (*) 3.5 - 5.2 g/dL   AST 19  0 - 37 U/L   ALT 23  0 - 53 U/L   Alkaline Phosphatase 60  39 - 117 U/L   Total Bilirubin 0.3  0.3 - 1.2 mg/dL   Bilirubin, Direct <0.2  0.0 - 0.3 mg/dL   Indirect Bilirubin NOT CALCULATED  0.3 - 0.9 mg/dL  I-STAT TROPOININ, ED      Result Value Ref Range   Troponin i, poc 0.00  0.00 - 0.08 ng/mL   Comment 3            No results found.    Imaging Review Ct Abdomen Pelvis W Contrast  12/12/2013   CLINICAL DATA:  Epigastric and left-sided abdominal pain  EXAM: CT ABDOMEN AND PELVIS WITH CONTRAST  TECHNIQUE: Multidetector CT imaging of the abdomen and pelvis was performed using the standard protocol following bolus administration of intravenous contrast.  CONTRAST:  139mL OMNIPAQUE IOHEXOL 300  MG/ML  SOLN  COMPARISON:  07/04/2013  FINDINGS: BODY WALL: Unremarkable.  LOWER CHEST: Extensive coronary artery atherosclerosis.  ABDOMEN/PELVIS:  Liver: No focal abnormality.  Biliary: Cholelithiasis.  No evidence of acute cholecystitis.  Pancreas: Unremarkable.  Spleen: Unremarkable.  Adrenals: 11 mm nodule in the lateral limb left adrenal gland, stable from November 2014 and statistically likely to represent adenoma.  Kidneys and ureters: Bilateral renal cysts which appears simple, the largest in the right lower pole measuring 5 cm. Two stones within the lower pole left kidney, measuring up to 4 mm. No evidence of solid mass. No hydronephrosis.  Bladder: Mild, smooth, symmetric bladder wall thickening.  Reproductive: Unremarkable for age.  Bowel: No obstruction. Negative appendix. Extensive distal colonic diverticulosis.  Retroperitoneum: No mass or adenopathy.  Peritoneum: Small calcifications associated with the greater omentum, incidental.  Vascular: Extensive aortic and branch vessel atherosclerosis. Aortobifemoral bypass with widely patent graft. There is high-grade occlusion of the bilateral proximal superficial femoral arteries, with robust flow in the imaged profunda bilaterally. Concerning the history of epigastric pain, there is stenosis at the celiac axis origin, but no significant stenosis of the SMA system.  OSSEOUS: L5 pars defects status post discectomy and posterior rod and pedicle screw fixation. There is doubtful osseous fusion across the discectomy, with halo around the S1 screws. No acute osseous findings.  IMPRESSION: 1. No acute findings. 2. Cholelithiasis.  No evidence of acute cholecystitis. 3. Colonic diverticulosis. 4. Left nephrolithiasis, nonobstructive. 5. L5-S1 PLIF with pseudoarthrosis.   Electronically Signed   By: Gilford Silvius.D.  On: 12/12/2013 23:02     EKG Interpretation None      MDM   Final diagnoses:  Gastritis    Patient presents with epigastric pain.  He says his pain is worse with eating. He did have some tenderness to the left side of his abdomen so I did do a CT scan which did not show any acute findings.  He has significant atherosclerotic disease in his abdomen which I did discuss with the patient. His physical findings are not consistent with mesenteric ischemia. There is no evidence of cholecystitis. His lipase is normal without suggestions of pancreatitis. He is already on Protonix and Dexilant. I gave him a prescription for Carafate. I encouraged him to use a bland diet and to followup with his gastroenterologist. He'll return here for symptoms worsen.    Malvin Johns, MD 12/12/13 [place], MD 12/12/13 2358

## 2013-12-29 ENCOUNTER — Other Ambulatory Visit: Payer: Self-pay | Admitting: Gastroenterology

## 2013-12-29 DIAGNOSIS — K219 Gastro-esophageal reflux disease without esophagitis: Secondary | ICD-10-CM | POA: Diagnosis not present

## 2013-12-29 DIAGNOSIS — R1310 Dysphagia, unspecified: Secondary | ICD-10-CM | POA: Diagnosis not present

## 2013-12-29 DIAGNOSIS — R0789 Other chest pain: Secondary | ICD-10-CM | POA: Diagnosis not present

## 2013-12-29 DIAGNOSIS — K591 Functional diarrhea: Secondary | ICD-10-CM | POA: Diagnosis not present

## 2013-12-30 ENCOUNTER — Ambulatory Visit
Admission: RE | Admit: 2013-12-30 | Discharge: 2013-12-30 | Disposition: A | Discharge status: Home / Self Care | Payer: Medicare Other | Source: Ambulatory Visit | Attending: Gastroenterology | Admitting: Gastroenterology

## 2013-12-30 DIAGNOSIS — R4789 Other speech disturbances: Secondary | ICD-10-CM | POA: Diagnosis not present

## 2013-12-30 DIAGNOSIS — R1310 Dysphagia, unspecified: Secondary | ICD-10-CM

## 2014-01-09 ENCOUNTER — Encounter: Payer: Self-pay | Admitting: Family

## 2014-01-10 ENCOUNTER — Ambulatory Visit (INDEPENDENT_AMBULATORY_CARE_PROVIDER_SITE_OTHER): Payer: Medicare Other | Admitting: Family

## 2014-01-10 ENCOUNTER — Ambulatory Visit (INDEPENDENT_AMBULATORY_CARE_PROVIDER_SITE_OTHER)
Admission: RE | Admit: 2014-01-10 | Discharge: 2014-01-10 | Disposition: A | Discharge status: Home / Self Care | Payer: Medicare Other | Source: Ambulatory Visit | Attending: Vascular Surgery | Admitting: Vascular Surgery

## 2014-01-10 ENCOUNTER — Ambulatory Visit (HOSPITAL_COMMUNITY)
Admission: RE | Admit: 2014-01-10 | Discharge: 2014-01-10 | Disposition: A | Discharge status: Home / Self Care | Payer: Medicare Other | Source: Ambulatory Visit | Attending: Family | Admitting: Family

## 2014-01-10 ENCOUNTER — Encounter: Payer: Self-pay | Admitting: Family

## 2014-01-10 VITALS — BP 134/82 | HR 85 | Resp 16 | Ht 72.0 in | Wt 215.0 lb

## 2014-01-10 DIAGNOSIS — I6529 Occlusion and stenosis of unspecified carotid artery: Secondary | ICD-10-CM | POA: Diagnosis not present

## 2014-01-10 DIAGNOSIS — I739 Peripheral vascular disease, unspecified: Secondary | ICD-10-CM

## 2014-01-10 DIAGNOSIS — Z48812 Encounter for surgical aftercare following surgery on the circulatory system: Secondary | ICD-10-CM | POA: Insufficient documentation

## 2014-01-10 NOTE — Patient Instructions (Signed)
Stroke Prevention Some medical conditions and behaviors are associated with an increased chance of having a stroke. You may prevent a stroke by making healthy choices and managing medical conditions. HOW CAN I REDUCE MY RISK OF HAVING A STROKE?   Stay physically active. Get at least 30 minutes of activity on most or all days.  Do not smoke. It may also be helpful to avoid exposure to secondhand smoke.  Limit alcohol use. Moderate alcohol use is considered to be:  No more than 2 drinks per day for men.  No more than 1 drink per day for nonpregnant women.  Eat healthy foods. This involves  Eating 5 or more servings of fruits and vegetables a day.  Following a diet that addresses high blood pressure (hypertension), high cholesterol, diabetes, or obesity.  Manage your cholesterol levels.  A diet low in saturated fat, trans fat, and cholesterol and high in fiber may control cholesterol levels.  Take any prescribed medicines to control cholesterol as directed by your health care provider.  Manage your diabetes.  A controlled-carbohydrate, controlled-sugar diet is recommended to manage diabetes.  Take any prescribed medicines to control diabetes as directed by your health care provider.  Control your hypertension.  A low-salt (sodium), low-saturated fat, low-trans fat, and low-cholesterol diet is recommended to manage hypertension.  Take any prescribed medicines to control hypertension as directed by your health care provider.  Maintain a healthy weight.  A reduced-calorie, low-sodium, low-saturated fat, low-trans fat, low-cholesterol diet is recommended to manage weight.  Stop drug abuse.  Avoid taking birth control pills.  Talk to your health care provider about the risks of taking birth control pills if you are over 35 years old, smoke, get migraines, or have ever had a blood clot.  Get evaluated for sleep disorders (sleep apnea).  Talk to your health care provider about  getting a sleep evaluation if you snore a lot or have excessive sleepiness.  Take medicines as directed by your health care provider.  For some people, aspirin or blood thinners (anticoagulants) are helpful in reducing the risk of forming abnormal blood clots that can lead to stroke. If you have the irregular heart rhythm of atrial fibrillation, you should be on a blood thinner unless there is a good reason you cannot take them.  Understand all your medicine instructions.  Make sure that other other conditions (such as anemia or atherosclerosis) are addressed. SEEK IMMEDIATE MEDICAL CARE IF:   You have sudden weakness or numbness of the face, arm, or leg, especially on one side of the body.  Your face or eyelid droops to one side.  You have sudden confusion.  You have trouble speaking (aphasia) or understanding.  You have sudden trouble seeing in one or both eyes.  You have sudden trouble walking.  You have dizziness.  You have a loss of balance or coordination.  You have a sudden, severe headache with no known cause.  You have new chest pain or an irregular heartbeat. Any of these symptoms may represent a serious problem that is an emergency. Do not wait to see if the symptoms will go away. Get medical help at once. Call your local emergency services  (911 in [place] Do not drive yourself to the hospital. Document Released: 09/04/2004 Document Revised: 05/18/2013 Document Reviewed: 01/28/2013 [employer] Patient Information 2014 [employer]   Peripheral Vascular Disease Peripheral Vascular Disease (PVD), also called Peripheral Arterial Disease (PAD), is a circulation problem caused by cholesterol (atherosclerotic plaque) deposits in the   arteries. PVD commonly occurs in the lower extremities (legs) but it can occur in other areas of the body, such as your arms. The cholesterol buildup in the arteries reduces blood flow which can cause pain and other serious problems. The presence  of PVD can place a person at risk for Coronary Artery Disease (CAD).  CAUSES  Causes of PVD can be many. It is usually associated with more than one risk factor such as:   High Cholesterol.  Smoking.  Diabetes.  Lack of exercise or inactivity.  High blood pressure (hypertension).  Obesity.  Family history. SYMPTOMS   When the lower extremities are affected, patients with PVD may experience:  Leg pain with exertion or physical activity. This is called INTERMITTENT CLAUDICATION. This may present as cramping or numbness with physical activity. The location of the pain is associated with the level of blockage. For example, blockage at the abdominal level (distal abdominal aorta) may result in buttock or hip pain. Lower leg arterial blockage may result in calf pain.  As PVD becomes more severe, pain can develop with less physical activity.  In people with severe PVD, leg pain may occur at rest.  Other PVD signs and symptoms:  Leg numbness or weakness.  Coldness in the affected leg or foot, especially when compared to the other leg.  A change in leg color.  Patients with significant PVD are more prone to ulcers or sores on toes, feet or legs. These may take longer to heal or may reoccur. The ulcers or sores can become infected.  If signs and symptoms of PVD are ignored, gangrene may occur. This can result in the loss of toes or loss of an entire limb.  Not all leg pain is related to PVD. Other medical conditions can cause leg pain such as:  Blood clots (embolism) or Deep Vein Thrombosis.  Inflammation of the blood vessels (vasculitis).  Spinal stenosis. DIAGNOSIS  Diagnosis of PVD can involve several different types of tests. These can include:  Pulse Volume Recording Method (PVR). This test is simple, painless and does not involve the use of X-rays. PVR involves measuring and comparing the blood pressure in the arms and legs. An ABI (Ankle-Brachial Index) is calculated.  The normal ratio of blood pressures is 1. As this number becomes smaller, it indicates more severe disease.  < 0.95  indicates significant narrowing in one or more leg vessels.  <0.8 there will usually be pain in the foot, leg or buttock with exercise.  <0.4 will usually have pain in the legs at rest.  <0.25  usually indicates limb threatening PVD.  Doppler detection of pulses in the legs. This test is painless and checks to see if you have a pulses in your legs/feet.  A dye or contrast material (a substance that highlights the blood vessels so they show up on x-ray) may be given to help your caregiver better see the arteries for the following tests. The dye is eliminated from your body by the kidney's. Your caregiver may order blood work to check your kidney function and other laboratory values before the following tests are performed:  Magnetic Resonance Angiography (MRA). An MRA is a picture study of the blood vessels and arteries. The MRA machine uses a large magnet to produce images of the blood vessels.  Computed Tomography Angiography (CTA). A CTA is a specialized x-ray that looks at how the blood flows in your blood vessels. An IV may be inserted into your arm so contrast dye   can be injected.  Angiogram. Is a procedure that uses x-rays to look at your blood vessels. This procedure is minimally invasive, meaning a small incision (cut) is made in your groin. A small tube (catheter) is then inserted into the artery of your groin. The catheter is guided to the blood vessel or artery your caregiver wants to examine. Contrast dye is injected into the catheter. X-rays are then taken of the blood vessel or artery. After the images are obtained, the catheter is taken out. TREATMENT  Treatment of PVD involves many interventions which may include:  Lifestyle changes:  Quitting smoking.  Exercise.  Following a low fat, low cholesterol diet.  Control of diabetes.  Foot care is very  important to the PVD patient. Good foot care can help prevent infection.  Medication:  Cholesterol-lowering medicine.  Blood pressure medicine.  Anti-platelet drugs.  Certain medicines may reduce symptoms of Intermittent Claudication.  Interventional/Surgical options:  Angioplasty. An Angioplasty is a procedure that inflates a balloon in the blocked artery. This opens the blocked artery to improve blood flow.  Stent Implant. A wire mesh tube (stent) is placed in the artery. The stent expands and stays in place, allowing the artery to remain open.  Peripheral Bypass Surgery. This is a surgical procedure that reroutes the blood around a blocked artery to help improve blood flow. This type of procedure may be performed if Angioplasty or stent implants are not an option. SEEK IMMEDIATE MEDICAL CARE IF:   You develop pain or numbness in your arms or legs.  Your arm or leg turns cold, becomes blue in color.  You develop redness, warmth, swelling and pain in your arms or legs. MAKE SURE YOU:   Understand these instructions.  Will watch your condition.  Will get help right away if you are not doing well or get worse. Document Released: 09/04/2004 Document Revised: 10/20/2011 Document Reviewed: 08/01/2008 [employer] Patient Information 2014 [employer].  

## 2014-01-10 NOTE — Progress Notes (Signed)
[facility] [place] HISTORY AND PHYSICAL   MRN : [id number]  History of Present Illness:   Bradley [name] is a 68 y.o. male patient of Dr. Donnetta Hutching returns today for followup of his diffuse peripheral vascular occlusive disease. He undergone prior aortobifemoral bypass for lower extremity arterial occlusive disease in 1998. He underwent a left carotid endarterectomy in 1994 with a redo for recurrent stenosis in 2001. He exercises 3x/week, playing golf. Pt denies claudication symptoms in legs with walking, denies non healing wounds. Pt reports stroke at age 71, then several TIA's, the last TIA was the night before the 2001 redo of the left CEA.  Pt Diabetic: Yes, states A1C is about 7.0 Pt smoker: former smoker, quit in 2010  Current Outpatient Prescriptions  Medication Sig Dispense Refill  . aspirin 81 MG tablet Take 81 mg by mouth daily.        Marland Kitchen atorvastatin (LIPITOR) 40 MG tablet Take 40 mg by mouth daily.        . Calcium Carbonate-Vitamin D (CALCIUM-D) 600-400 MG-UNIT TABS Take by mouth.      . carvedilol (COREG) 3.125 MG tablet Take 3.125 mg by mouth 2 (two) times daily with a meal.      . clopidogrel (PLAVIX) 75 MG tablet Take 75 mg by mouth daily.        Marland Kitchen dexlansoprazole (DEXILANT) 60 MG capsule Take 60 mg by mouth daily.      Marland Kitchen glimepiride (AMARYL) 2 MG tablet Take 2 mg by mouth daily before breakfast.        . Liraglutide (VICTOZA [place]) Inject 0.6 mLs into the skin daily.      Marland Kitchen losartan (COZAAR) 100 MG tablet Take 100 mg by mouth Daily.      . metFORMIN (GLUCOPHAGE-XR) 500 MG 24 hr tablet Take 1,000 mg by mouth 2 (two) times daily.       . Multiple Vitamin (MULTIVITAMIN) capsule Take 1 capsule by mouth daily.        . niacin (NIASPAN) 500 MG CR tablet Take 500 mg by mouth at bedtime.        . pantoprazole (PROTONIX) 40 MG tablet Take 40 mg by mouth daily.      . vitamin E 400 UNIT capsule Take 400 Units by mouth daily.        . Cholecalciferol (D-3-5) 5000  UNITS capsule Take 5,000 Units by mouth daily.      . sucralfate (CARAFATE) 1 G tablet Take 1 tablet (1 g total) by mouth 4 (four) times daily -  with meals and at bedtime.  120 tablet  0   No current facility-administered medications for this visit.    Pt meds include: Statin :Yes Betablocker: Yes ASA: Yes Other anticoagulants/antiplatelets: Plavix  Past Medical History  Diagnosis Date  . Hyperlipidemia   . Hypertension   . Diabetes mellitus age 35  . Peripheral vascular disease   . Arthritis   . Reflux esophagitis   . Stroke 1987    Right brain stroke  . Neuropathy 2013  . Osteoporosis 2013  . Sleep apnea     uses CPAP  . GERD (gastroesophageal reflux disease)   . H/O hiatal hernia   . Irritable bowel syndrome (IBS)   . Myocardial infarction     silent inferior MI; patient denies MI history (03/17/13)   . Anemia   . Carotid artery occlusion     Past Surgical History  Procedure Laterality Date  . Carotid endarterectomy  1994 & redo 2001    Left  . Pr vein bypass graft,aorto-fem-pop  1998  . Iliac artery stent    . Fracture surgery  2013    Right   foo  t X's 2  . Tonsillectomy    . Posterior lumbar fusion  Aug. 14, 2014    Level 1    Social History History  Substance Use Topics  . Smoking status: Former Smoker -- 1.00 packs/day for 40 years    Types: Cigarettes    Quit date: 03/26/2009  . Smokeless tobacco: Never Used  . Alcohol Use: No    Family History Family History  Problem Relation Age of Onset  . Cancer Mother 81    pancreatic  . Stroke Father 75  . Deep vein thrombosis Father   . Hyperlipidemia Father   . Hypertension Father     Allergies  Allergen Reactions  . Dilaudid Cough Nausea Only  . Food     "garlic, tomatoes, acid foods"= mouth blisters  . Hydrocodone Nausea Only  . Oxycodone Nausea Only  . Ultram [Tramadol Hcl] Nausea Only  . Penicillins Rash  . Sulfa Drugs Cross Reactors Rash     REVIEW OF SYSTEMS: See HPI for  pertinent positives and negatives.  Physical Examination Filed Vitals:   01/10/14 1028 01/10/14 1031  BP: 132/80 134/82  Pulse: 81 85  Resp:  16  Height:  6' (1.829 m)  Weight:  215 lb (97.523 kg)  SpO2:  99%   Body mass index is 29.15 kg/(m^2).  General:  WDWN in NAD Gait: Normal HENT: WNL Eyes: Pupils equal Pulmonary: normal non-labored breathing , without Rales, rhonchi,  wheezing Cardiac: RRR, no murmurs detected  Abdomen: soft, NT, no masses Skin: no rashes, ulcers noted;  no Gangrene , no cellulitis.  VASCULAR EXAM  Carotid Bruits Left Right   Negative Negative                             VASCULAR EXAM: Extremities without ischemic changes  without Gangrene; without open wounds; feet are well perfused.                                                                                                          LE Pulses LEFT RIGHT       FEMORAL   palpable   palpable        POPLITEAL  not palpable   not palpable       POSTERIOR TIBIAL  not palpable   not palpable        DORSALIS PEDIS      ANTERIOR TIBIAL not palpable  not palpable     Musculoskeletal: no muscle wasting or atrophy; no edema  Neurologic: A&O X 3; Appropriate Affect ;  SENSATION: normal; MOTOR FUNCTION: 5/5 Symmetric, CN 2-12 intact Speech is fluent/normal   Non-Invasive Vascular Imaging (01/10/2014):  CEREBROVASCULAR DUPLEX EVALUATION    INDICATION: Carotid endarterectomy     PREVIOUS INTERVENTION(S): Left carotid endarterectomy in 1994 with revision  in 2001    DUPLEX EXAM:     RIGHT  LEFT  Peak Systolic Velocities (cm/s) End Diastolic Velocities (cm/s) Plaque LOCATION Peak Systolic Velocities (cm/s) End Diastolic Velocities (cm/s) Plaque  87 11  CCA PROXIMAL 108 31   70 17 HT CCA MID 81 21   68 19 HT CCA DISTAL 82 21 CP  227 31 HT ECA 66 11   201 68 CP ICA PROXIMAL 47 19 HT  78 17  ICA MID 49 18   43 13  ICA DISTAL 29 8     2.96 ICA / CCA Ratio (PSV) Not Calculated  Antegrade  Vertebral Flow Antegrade  123XX123 Brachial Systolic Pressure (mmHg) 0000000  Multiphasic (subclavian artery) Brachial Artery Waveforms Multiphasic (subclavian artery)    Plaque Morphology:  HM = Homogeneous, HT = Heterogeneous, CP = Calcific Plaque, SP = Smooth Plaque, IP = Irregular Plaque     ADDITIONAL FINDINGS:   No significant stenosis of the left external or bilateral common carotid arteries.   Right external carotid artery stenosis noted.    IMPRESSION: 1. Patent left carotid endarterectomy site noted. 2. Doppler velocities suggest a 60-79% stenosis of the right proximal internal carotid artery and a less than 40% stenosis of the left proximal internal carotid artery.    Compared to the previous exam:  Mild increase in velocity of the right proximal internal carotid artery when compared to the previous exam on 11/23/12 with the left internal carotid artery remaining stable.   ABI's: Right: 0.75 with mono and biphasic waveforms; Left: 0.75 with bi and monophasic waveforms. Prveious (11/23/12) ABI's: Right: 0.66, Left: 0.70  ASSESSMENT:  TKAI KILLEY is a 68 y.o. male who is s/p aortobifemoral bypass for lower extremity arterial occlusive disease in 1998. He underwent a left carotid endarterectomy in 1994 with a redo for recurrent stenosis in 2001. He exercises 3x/week, also plays golf. Pt denies claudication symptoms in legs with walking, denies non healing wounds. Slight improvement in ABI's, moderate arterial occlusive disease in both legs. Feet are well perfused.  Pt reports stroke at age 34, then several TIA's, the last TIA was the night before the 2001 redo of the left CEA. Patent left carotid endarterectomy site noted. Doppler velocities suggest a 60-79% stenosis of the right proximal internal carotid artery and a less than 40% stenosis of the left proximal internal carotid artery. Mild increase in velocity of the right proximal internal carotid artery when compared to the previous  exam on 11/23/12 with the left internal carotid artery remaining stable.   PLAN:   Based on today's exam and non-invasive vascular lab results, the patient will follow up in 6 months with the following tests carotid Duplex and ABI's. I discussed in depth with the patient the nature of atherosclerosis, and emphasized the importance of maximal medical management including strict control of blood pressure, blood glucose, and lipid levels, obtaining regular exercise, and cessation of smoking.  The patient is aware that without maximal medical management the underlying atherosclerotic disease process will progress, limiting the benefit of any interventions. The patient was given information about stroke prevention and what symptoms should prompt the patient to seek immediate medical care.  The patient was given information about PAD including signs, symptoms, treatment, what symptoms should prompt the patient to seek immediate medical care, and risk reduction measures to take. Thank you for allowing [place] to participate in this patient's care.  Clemon Chambers, RN, MSN, FNP-C [facility] Office: [phone]  Clinic MD: Kellie Simmering 01/10/2014 10:53 AM

## 2014-01-23 DIAGNOSIS — R1310 Dysphagia, unspecified: Secondary | ICD-10-CM | POA: Diagnosis not present

## 2014-01-23 DIAGNOSIS — K3189 Other diseases of stomach and duodenum: Secondary | ICD-10-CM | POA: Diagnosis not present

## 2014-01-23 DIAGNOSIS — K591 Functional diarrhea: Secondary | ICD-10-CM | POA: Diagnosis not present

## 2014-01-23 DIAGNOSIS — R933 Abnormal findings on diagnostic imaging of other parts of digestive tract: Secondary | ICD-10-CM | POA: Diagnosis not present

## 2014-01-26 DIAGNOSIS — G4733 Obstructive sleep apnea (adult) (pediatric): Secondary | ICD-10-CM | POA: Diagnosis not present

## 2014-01-26 DIAGNOSIS — R269 Unspecified abnormalities of gait and mobility: Secondary | ICD-10-CM | POA: Diagnosis not present

## 2014-01-26 DIAGNOSIS — K219 Gastro-esophageal reflux disease without esophagitis: Secondary | ICD-10-CM | POA: Diagnosis not present

## 2014-01-26 DIAGNOSIS — Z23 Encounter for immunization: Secondary | ICD-10-CM | POA: Diagnosis not present

## 2014-01-26 DIAGNOSIS — IMO0001 Reserved for inherently not codable concepts without codable children: Secondary | ICD-10-CM | POA: Diagnosis not present

## 2014-01-26 DIAGNOSIS — I679 Cerebrovascular disease, unspecified: Secondary | ICD-10-CM | POA: Diagnosis not present

## 2014-01-26 DIAGNOSIS — E7800 Pure hypercholesterolemia, unspecified: Secondary | ICD-10-CM | POA: Diagnosis not present

## 2014-01-26 DIAGNOSIS — Z0000 Encounter for general adult medical examination without abnormal findings: Secondary | ICD-10-CM | POA: Diagnosis not present

## 2014-01-31 DIAGNOSIS — G4733 Obstructive sleep apnea (adult) (pediatric): Secondary | ICD-10-CM | POA: Diagnosis not present

## 2014-02-09 DIAGNOSIS — L821 Other seborrheic keratosis: Secondary | ICD-10-CM | POA: Diagnosis not present

## 2014-02-09 DIAGNOSIS — Z85828 Personal history of other malignant neoplasm of skin: Secondary | ICD-10-CM | POA: Diagnosis not present

## 2014-02-09 DIAGNOSIS — L570 Actinic keratosis: Secondary | ICD-10-CM | POA: Diagnosis not present

## 2014-02-15 DIAGNOSIS — J029 Acute pharyngitis, unspecified: Secondary | ICD-10-CM | POA: Diagnosis not present

## 2014-02-15 DIAGNOSIS — J069 Acute upper respiratory infection, unspecified: Secondary | ICD-10-CM | POA: Diagnosis not present

## 2014-02-21 ENCOUNTER — Ambulatory Visit: Payer: Medicare Other | Attending: Family Medicine | Admitting: Rehabilitative and Restorative Service Providers"

## 2014-02-21 DIAGNOSIS — G4730 Sleep apnea, unspecified: Secondary | ICD-10-CM | POA: Insufficient documentation

## 2014-02-21 DIAGNOSIS — Z8673 Personal history of transient ischemic attack (TIA), and cerebral infarction without residual deficits: Secondary | ICD-10-CM | POA: Diagnosis not present

## 2014-02-21 DIAGNOSIS — R269 Unspecified abnormalities of gait and mobility: Secondary | ICD-10-CM | POA: Diagnosis not present

## 2014-02-21 DIAGNOSIS — G589 Mononeuropathy, unspecified: Secondary | ICD-10-CM | POA: Diagnosis not present

## 2014-02-21 DIAGNOSIS — K589 Irritable bowel syndrome without diarrhea: Secondary | ICD-10-CM | POA: Insufficient documentation

## 2014-02-21 DIAGNOSIS — IMO0001 Reserved for inherently not codable concepts without codable children: Secondary | ICD-10-CM | POA: Insufficient documentation

## 2014-02-21 DIAGNOSIS — I10 Essential (primary) hypertension: Secondary | ICD-10-CM | POA: Diagnosis not present

## 2014-02-21 DIAGNOSIS — Z981 Arthrodesis status: Secondary | ICD-10-CM | POA: Diagnosis not present

## 2014-02-21 DIAGNOSIS — E119 Type 2 diabetes mellitus without complications: Secondary | ICD-10-CM | POA: Insufficient documentation

## 2014-02-21 DIAGNOSIS — I739 Peripheral vascular disease, unspecified: Secondary | ICD-10-CM | POA: Insufficient documentation

## 2014-02-21 DIAGNOSIS — M1990 Unspecified osteoarthritis, unspecified site: Secondary | ICD-10-CM | POA: Diagnosis not present

## 2014-02-28 DIAGNOSIS — G4733 Obstructive sleep apnea (adult) (pediatric): Secondary | ICD-10-CM | POA: Diagnosis not present

## 2014-02-28 DIAGNOSIS — I10 Essential (primary) hypertension: Secondary | ICD-10-CM | POA: Diagnosis not present

## 2014-02-28 DIAGNOSIS — G4710 Hypersomnia, unspecified: Secondary | ICD-10-CM | POA: Diagnosis not present

## 2014-02-28 DIAGNOSIS — G4730 Sleep apnea, unspecified: Secondary | ICD-10-CM | POA: Diagnosis not present

## 2014-03-03 ENCOUNTER — Ambulatory Visit: Payer: Medicare Other | Admitting: Rehabilitative and Restorative Service Providers"

## 2014-03-03 DIAGNOSIS — Z981 Arthrodesis status: Secondary | ICD-10-CM | POA: Diagnosis not present

## 2014-03-03 DIAGNOSIS — Z8673 Personal history of transient ischemic attack (TIA), and cerebral infarction without residual deficits: Secondary | ICD-10-CM | POA: Diagnosis not present

## 2014-03-03 DIAGNOSIS — IMO0001 Reserved for inherently not codable concepts without codable children: Secondary | ICD-10-CM | POA: Diagnosis not present

## 2014-03-03 DIAGNOSIS — I10 Essential (primary) hypertension: Secondary | ICD-10-CM | POA: Diagnosis not present

## 2014-03-03 DIAGNOSIS — R269 Unspecified abnormalities of gait and mobility: Secondary | ICD-10-CM | POA: Diagnosis not present

## 2014-03-03 DIAGNOSIS — E119 Type 2 diabetes mellitus without complications: Secondary | ICD-10-CM | POA: Diagnosis not present

## 2014-03-08 ENCOUNTER — Ambulatory Visit: Payer: Medicare Other | Admitting: Rehabilitative and Restorative Service Providers"

## 2014-03-08 DIAGNOSIS — I10 Essential (primary) hypertension: Secondary | ICD-10-CM | POA: Diagnosis not present

## 2014-03-08 DIAGNOSIS — IMO0001 Reserved for inherently not codable concepts without codable children: Secondary | ICD-10-CM | POA: Diagnosis not present

## 2014-03-08 DIAGNOSIS — E119 Type 2 diabetes mellitus without complications: Secondary | ICD-10-CM | POA: Diagnosis not present

## 2014-03-08 DIAGNOSIS — Z8673 Personal history of transient ischemic attack (TIA), and cerebral infarction without residual deficits: Secondary | ICD-10-CM | POA: Diagnosis not present

## 2014-03-08 DIAGNOSIS — Z981 Arthrodesis status: Secondary | ICD-10-CM | POA: Diagnosis not present

## 2014-03-08 DIAGNOSIS — R269 Unspecified abnormalities of gait and mobility: Secondary | ICD-10-CM | POA: Diagnosis not present

## 2014-03-15 ENCOUNTER — Ambulatory Visit: Payer: Medicare Other | Attending: Family Medicine | Admitting: Rehabilitative and Restorative Service Providers"

## 2014-03-15 DIAGNOSIS — IMO0001 Reserved for inherently not codable concepts without codable children: Secondary | ICD-10-CM | POA: Insufficient documentation

## 2014-03-15 DIAGNOSIS — I739 Peripheral vascular disease, unspecified: Secondary | ICD-10-CM | POA: Diagnosis not present

## 2014-03-15 DIAGNOSIS — Z8673 Personal history of transient ischemic attack (TIA), and cerebral infarction without residual deficits: Secondary | ICD-10-CM | POA: Insufficient documentation

## 2014-03-15 DIAGNOSIS — I10 Essential (primary) hypertension: Secondary | ICD-10-CM | POA: Insufficient documentation

## 2014-03-15 DIAGNOSIS — Z981 Arthrodesis status: Secondary | ICD-10-CM | POA: Insufficient documentation

## 2014-03-15 DIAGNOSIS — M1990 Unspecified osteoarthritis, unspecified site: Secondary | ICD-10-CM | POA: Insufficient documentation

## 2014-03-15 DIAGNOSIS — G589 Mononeuropathy, unspecified: Secondary | ICD-10-CM | POA: Insufficient documentation

## 2014-03-15 DIAGNOSIS — K589 Irritable bowel syndrome without diarrhea: Secondary | ICD-10-CM | POA: Diagnosis not present

## 2014-03-15 DIAGNOSIS — G4730 Sleep apnea, unspecified: Secondary | ICD-10-CM | POA: Insufficient documentation

## 2014-03-15 DIAGNOSIS — R269 Unspecified abnormalities of gait and mobility: Secondary | ICD-10-CM | POA: Insufficient documentation

## 2014-03-15 DIAGNOSIS — E119 Type 2 diabetes mellitus without complications: Secondary | ICD-10-CM | POA: Diagnosis not present

## 2014-03-22 ENCOUNTER — Ambulatory Visit: Payer: Medicare Other | Admitting: Rehabilitative and Restorative Service Providers"

## 2014-03-22 DIAGNOSIS — IMO0001 Reserved for inherently not codable concepts without codable children: Secondary | ICD-10-CM | POA: Diagnosis not present

## 2014-03-22 DIAGNOSIS — Z981 Arthrodesis status: Secondary | ICD-10-CM | POA: Diagnosis not present

## 2014-03-22 DIAGNOSIS — I10 Essential (primary) hypertension: Secondary | ICD-10-CM | POA: Diagnosis not present

## 2014-03-22 DIAGNOSIS — E119 Type 2 diabetes mellitus without complications: Secondary | ICD-10-CM | POA: Diagnosis not present

## 2014-03-22 DIAGNOSIS — R269 Unspecified abnormalities of gait and mobility: Secondary | ICD-10-CM | POA: Diagnosis not present

## 2014-03-22 DIAGNOSIS — Z8673 Personal history of transient ischemic attack (TIA), and cerebral infarction without residual deficits: Secondary | ICD-10-CM | POA: Diagnosis not present

## 2014-05-19 DIAGNOSIS — X32XXXA Exposure to sunlight, initial encounter: Secondary | ICD-10-CM | POA: Diagnosis not present

## 2014-05-19 DIAGNOSIS — L570 Actinic keratosis: Secondary | ICD-10-CM | POA: Diagnosis not present

## 2014-06-07 DIAGNOSIS — R109 Unspecified abdominal pain: Secondary | ICD-10-CM | POA: Diagnosis not present

## 2014-06-07 DIAGNOSIS — R232 Flushing: Secondary | ICD-10-CM | POA: Diagnosis not present

## 2014-06-07 DIAGNOSIS — R319 Hematuria, unspecified: Secondary | ICD-10-CM | POA: Diagnosis not present

## 2014-06-07 DIAGNOSIS — Z23 Encounter for immunization: Secondary | ICD-10-CM | POA: Diagnosis not present

## 2014-06-07 DIAGNOSIS — E780 Pure hypercholesterolemia: Secondary | ICD-10-CM | POA: Diagnosis not present

## 2014-06-13 DIAGNOSIS — L570 Actinic keratosis: Secondary | ICD-10-CM | POA: Diagnosis not present

## 2014-07-03 DIAGNOSIS — L570 Actinic keratosis: Secondary | ICD-10-CM | POA: Diagnosis not present

## 2014-07-10 ENCOUNTER — Encounter: Payer: Self-pay | Admitting: Family

## 2014-07-11 ENCOUNTER — Encounter: Payer: Self-pay | Admitting: Family

## 2014-07-11 ENCOUNTER — Ambulatory Visit (HOSPITAL_COMMUNITY)
Admission: RE | Admit: 2014-07-11 | Discharge: 2014-07-11 | Disposition: A | Discharge status: Home / Self Care | Payer: Medicare Other | Source: Ambulatory Visit | Attending: Family | Admitting: Family

## 2014-07-11 ENCOUNTER — Ambulatory Visit (INDEPENDENT_AMBULATORY_CARE_PROVIDER_SITE_OTHER)
Admission: RE | Admit: 2014-07-11 | Discharge: 2014-07-11 | Disposition: A | Discharge status: Home / Self Care | Payer: Medicare Other | Source: Ambulatory Visit | Attending: Family | Admitting: Family

## 2014-07-11 ENCOUNTER — Ambulatory Visit (INDEPENDENT_AMBULATORY_CARE_PROVIDER_SITE_OTHER): Payer: Medicare Other | Admitting: Family

## 2014-07-11 VITALS — BP 150/86 | HR 87 | Resp 16 | Ht 72.0 in | Wt 221.0 lb

## 2014-07-11 DIAGNOSIS — Z48812 Encounter for surgical aftercare following surgery on the circulatory system: Secondary | ICD-10-CM

## 2014-07-11 DIAGNOSIS — Z9889 Other specified postprocedural states: Secondary | ICD-10-CM

## 2014-07-11 DIAGNOSIS — I6523 Occlusion and stenosis of bilateral carotid arteries: Secondary | ICD-10-CM

## 2014-07-11 DIAGNOSIS — I6529 Occlusion and stenosis of unspecified carotid artery: Secondary | ICD-10-CM | POA: Diagnosis not present

## 2014-07-11 DIAGNOSIS — I739 Peripheral vascular disease, unspecified: Secondary | ICD-10-CM | POA: Diagnosis not present

## 2014-07-11 NOTE — Progress Notes (Signed)
Established Carotid Patient   History of Present Illness  Bradley [name] is a 68 y.o. male patient of Dr. Donnetta Hutching returns today for followup of his diffuse peripheral vascular occlusive disease. He undergone prior aortobifemoral bypass for lower extremity arterial occlusive disease in 1998. He underwent a left carotid endarterectomy in 1994 with a redo for recurrent stenosis in 2001. He exercises 3x/week, playing golf. Pt denies claudication symptoms in legs with walking, denies non healing wounds. Pt reports stroke at age 63, then several TIA's, the last TIA was the night before the 2001 redo of the left CEA. He had a presyncapol episode in 2010, EMS called, but he declined to be taken to the ED, pt states he denies any further presyncapol episodes.   Pt Diabetic: Yes, states A1C is about 6.5 Pt smoker: former smoker, quit in 2010  Pt meds include: Statin :Yes Betablocker: Yes ASA: Yes Other anticoagulants/antiplatelets: Plavix  Past Medical History  Diagnosis Date  . Hyperlipidemia   . Hypertension   . Diabetes mellitus age 48  . Peripheral vascular disease   . Arthritis   . Reflux esophagitis   . Stroke 1987    Right brain stroke  . Neuropathy 2013  . Osteoporosis 2013  . Sleep apnea     uses CPAP  . GERD (gastroesophageal reflux disease)   . H/O hiatal hernia   . Irritable bowel syndrome (IBS)   . Myocardial infarction     silent inferior MI; patient denies MI history (03/17/13)   . Anemia   . Carotid artery occlusion     Social History History  Substance Use Topics  . Smoking status: Former Smoker -- 1.00 packs/day for 40 years    Types: Cigarettes    Quit date: 03/26/2009  . Smokeless tobacco: Never Used  . Alcohol Use: No    Family History Family History  Problem Relation Age of Onset  . Cancer Mother 7    pancreatic  . Stroke Father 41  . Deep vein thrombosis Father   . Hyperlipidemia Father   . Hypertension Father     Surgical History Past  Surgical History  Procedure Laterality Date  . Carotid endarterectomy  1994 & redo 2001    Left  . Pr vein bypass graft,aorto-fem-pop  1998  . Iliac artery stent    . Fracture surgery  2013    Right   foo  t X's 2  . Tonsillectomy    . Posterior lumbar fusion  Aug. 14, 2014    Level 1    Allergies  Allergen Reactions  . Dilaudid Cough Nausea Only  . Food     "garlic, tomatoes, acid foods"= mouth blisters  . Hydrocodone Nausea Only  . Oxycodone Nausea Only  . Ultram [Tramadol Hcl] Nausea Only  . Penicillins Rash  . Sulfa Drugs Cross Reactors Rash    Current Outpatient Prescriptions  Medication Sig Dispense Refill  . aspirin 81 MG tablet Take 81 mg by mouth daily.      Marland Kitchen atorvastatin (LIPITOR) 40 MG tablet Take 40 mg by mouth daily.      . Calcium Carbonate-Vitamin D (CALCIUM-D) 600-400 MG-UNIT TABS Take by mouth.    . carvedilol (COREG) 3.125 MG tablet Take 3.125 mg by mouth 2 (two) times daily with a meal.    . Cholecalciferol (D-3-5) 5000 UNITS capsule Take 5,000 Units by mouth daily.    . clopidogrel (PLAVIX) 75 MG tablet Take 75 mg by mouth daily.      Marland Kitchen  dexlansoprazole (DEXILANT) 60 MG capsule Take 60 mg by mouth daily.    Marland Kitchen glimepiride (AMARYL) 2 MG tablet Take 2 mg by mouth daily before breakfast.      . Liraglutide (VICTOZA [place]) Inject 0.6 mLs into the skin daily.    Marland Kitchen losartan (COZAAR) 100 MG tablet Take 100 mg by mouth Daily.    . metFORMIN (GLUCOPHAGE-XR) 500 MG 24 hr tablet Take 1,000 mg by mouth 2 (two) times daily.     . Multiple Vitamin (MULTIVITAMIN) capsule Take 1 capsule by mouth daily.      . niacin (NIASPAN) 500 MG CR tablet Take 500 mg by mouth at bedtime.      . pantoprazole (PROTONIX) 40 MG tablet Take 40 mg by mouth daily.    . sucralfate (CARAFATE) 1 G tablet Take 1 tablet (1 g total) by mouth 4 (four) times daily -  with meals and at bedtime. 120 tablet 0  . vitamin E 400 UNIT capsule Take 400 Units by mouth daily.       No current  facility-administered medications for this visit.    Review of Systems : See HPI for pertinent positives and negatives.  Physical Examination  Filed Vitals:   07/11/14 1043 07/11/14 1046  BP: 139/83 150/86  Pulse: 85 87  Resp:  16  Height:  6' (1.829 m)  Weight:  221 lb (100.245 kg)  SpO2:  99%   Body mass index is 29.97 kg/(m^2).  General: WDWN in NAD Gait: Normal HENT: WNL Eyes: Pupils equal Pulmonary: normal non-labored breathing , without Rales, rhonchi, wheezing Cardiac: RRR, no murmurs detected  Abdomen: soft, NT, no palpable masses Skin: no rashes, ulcers noted; no Gangrene , no cellulitis.  VASCULAR EXAM  Carotid Bruits Left Right   Negative Negative  Radial pulses are 2+ palpable and =. Aorta is not palpable.   VASCULAR EXAM: Extremities without ischemic changes  without Gangrene; without open wounds; feet are well perfused.     LE Pulses LEFT RIGHT   FEMORAL  palpable  not palpable    POPLITEAL not palpable  not palpable   POSTERIOR TIBIAL not palpable  not palpable    DORSALIS PEDIS  ANTERIOR TIBIAL not palpable  not palpable     Musculoskeletal: no muscle wasting or atrophy; no edema Neurologic: A&O X 3; Appropriate Affect ;  SENSATION: normal; MOTOR FUNCTION: 5/5 Symmetric, CN 2-12 intact except for some hearing loss, Speech is fluent/normal  Non-Invasive Vascular Imaging CAROTID DUPLEX 07/11/2014   CEREBROVASCULAR DUPLEX EVALUATION    INDICATION: Carotid artery stenosis     PREVIOUS INTERVENTION(S): Left carotid endarterectomy 1994, revision 2001.    DUPLEX EXAM:     RIGHT  LEFT  Peak Systolic Velocities (cm/s) End Diastolic Velocities (cm/s) Plaque LOCATION Peak Systolic Velocities (cm/s) End Diastolic Velocities  (cm/s) Plaque  92 17  CCA PROXIMAL 137 39   70 21 HT CCA MID 97 31   81 22 HT CCA DISTAL 98 32 HT  264 46 HT ECA 83 16   201 69 CP ICA PROXIMAL 65 29   113 28  ICA MID 68 28   48 15  ICA DISTAL 44 23     2.87 ICA / CCA Ratio (PSV) carotid endarterectomy  Antegrade Vertebral Flow Antegrade  0000000 Brachial Systolic Pressure (mmHg) [id number]  Triphasic Brachial Artery Waveforms Triphasic    Plaque Morphology:  HM = Homogeneous, HT = Heterogeneous, CP = Calcific Plaque, SP = Smooth Plaque, IP = Irregular Plaque  ADDITIONAL FINDINGS:     IMPRESSION: Right internal carotid artery stenosis present in the 60%-79% range. Left internal carotid artery is patent with history of carotid endarterectomy, mild heterogeneous plaque present at the proximal/mid endarterectomized segment suggestive of restenosis of less than 40%. Right external carotid artery stenosis present.    Compared to the previous exam:  Unchanged since previous study on 01/10/2014.    ABI (Date: 07/11/2014)  R: 0.74 (01/10/14, 0.75), DP: 0.67, monophasic, PT: 0.74, biphasic, TBI: 0.58  L: 0.73 (0.75), DP: 0.73, biphasic, PT: 0.72, biphasic, TBI: 0.61    Assessment: Bradley [name] is a 68 y.o. male who is s/p aortobifemoral bypass for lower extremity arterial occlusive disease in 1998. He underwent a left carotid endarterectomy in 1994 with a redo for recurrent stenosis in 2001. He exercises 3x/week, also plays golf. Pt denies claudication symptoms in legs with walking, denies non healing wounds.  ABI's are stable, moderate arterial occlusive disease in both legs. Feet are well perfused.  Pt reports stroke at age 60, then several TIA's, the last TIA was the night before the 2001 redo of the left CEA. Today's carotid Duplex reveals right internal carotid artery stenosis present in the 60%-79% range. Left internal carotid artery is patent with history of carotid endarterectomy, mild heterogeneous plaque present at the  proximal/mid endarterectomized segment suggestive of restenosis of less than 40%; unchanged since previous study on 01/10/2014.    Plan: Follow-up in 6 months with Carotid Duplex and ABI's in a year.  Graduated walking program. Continued maximal medial management of atherosclerotic risk factors.    I discussed in depth with the patient the nature of atherosclerosis, and emphasized the importance of maximal medical management including strict control of blood pressure, blood glucose, and lipid levels, obtaining regular exercise, and continued cessation of smoking.  The patient is aware that without maximal medical management the underlying atherosclerotic disease process will progress, limiting the benefit of any interventions. The patient was given information about stroke prevention and what symptoms should prompt the patient to seek immediate medical care. Thank you for allowing [place] to participate in this patient's care.  Clemon Chambers, RN, MSN, FNP-C [facility] [employer] Office: [phone]  Clinic Physician: Early  07/11/2014 9:56 AM

## 2014-07-11 NOTE — Addendum Note (Signed)
Addended by: Mena Goes on: 07/11/2014 05:11 PM   Modules accepted: Orders

## 2014-07-11 NOTE — Patient Instructions (Addendum)

## 2014-07-26 DIAGNOSIS — E291 Testicular hypofunction: Secondary | ICD-10-CM | POA: Diagnosis not present

## 2014-07-26 DIAGNOSIS — E780 Pure hypercholesterolemia: Secondary | ICD-10-CM | POA: Diagnosis not present

## 2014-07-26 DIAGNOSIS — I10 Essential (primary) hypertension: Secondary | ICD-10-CM | POA: Diagnosis not present

## 2014-07-26 DIAGNOSIS — I679 Cerebrovascular disease, unspecified: Secondary | ICD-10-CM | POA: Diagnosis not present

## 2014-07-26 DIAGNOSIS — E1142 Type 2 diabetes mellitus with diabetic polyneuropathy: Secondary | ICD-10-CM | POA: Diagnosis not present

## 2015-01-15 ENCOUNTER — Encounter: Payer: Self-pay | Admitting: Family

## 2015-01-16 ENCOUNTER — Ambulatory Visit (INDEPENDENT_AMBULATORY_CARE_PROVIDER_SITE_OTHER): Payer: PPO | Admitting: Family

## 2015-01-16 ENCOUNTER — Ambulatory Visit (HOSPITAL_COMMUNITY)
Admission: RE | Admit: 2015-01-16 | Discharge: 2015-01-16 | Disposition: A | Discharge status: Home / Self Care | Payer: PPO | Source: Ambulatory Visit | Attending: Family | Admitting: Family

## 2015-01-16 ENCOUNTER — Encounter: Payer: Self-pay | Admitting: Family

## 2015-01-16 VITALS — BP 124/73 | HR 88 | Resp 16 | Ht 72.0 in | Wt 220.0 lb

## 2015-01-16 DIAGNOSIS — Z9889 Other specified postprocedural states: Secondary | ICD-10-CM

## 2015-01-16 DIAGNOSIS — I739 Peripheral vascular disease, unspecified: Secondary | ICD-10-CM | POA: Diagnosis not present

## 2015-01-16 DIAGNOSIS — Z48812 Encounter for surgical aftercare following surgery on the circulatory system: Secondary | ICD-10-CM

## 2015-01-16 DIAGNOSIS — I6521 Occlusion and stenosis of right carotid artery: Secondary | ICD-10-CM | POA: Diagnosis not present

## 2015-01-16 DIAGNOSIS — I6523 Occlusion and stenosis of bilateral carotid arteries: Secondary | ICD-10-CM | POA: Diagnosis present

## 2015-01-16 NOTE — Patient Instructions (Signed)

## 2015-01-16 NOTE — Progress Notes (Signed)
Established Carotid Patient   History of Present Illness  Bradley [name] is a 69 y.o. male  patient of Dr. Donnetta Hutching returns today for followup of his diffuse peripheral vascular occlusive disease. He undergone prior aortobifemoral bypass for lower extremity arterial occlusive disease in 1998. He underwent a left carotid endarterectomy in 1994 with a redo for recurrent stenosis in 2001. He exercises 3x/week, playing golf. Pt denies claudication symptoms in legs with walking, denies non healing wounds. Pt reports stroke at age 2, then several TIA's, the last TIA was the night before the 2001 redo of the left CEA. He had a presyncapol episode in 2010, EMS called, but he declined to be taken to the ED, pt states he denies any further presyncapol episodes.   Pt Diabetic: Yes, states A1C is about 7.5, increased from previous 6.5; states he is working to improve his glycemic control Pt smoker: former smoker, quit in 2010  Pt meds include: Statin :Yes Betablocker: Yes ASA: Yes Other anticoagulants/antiplatelets: Plavix  Past Medical History  Diagnosis Date  . Hyperlipidemia   . Hypertension   . Diabetes mellitus age 23  . Peripheral vascular disease   . Arthritis   . Reflux esophagitis   . Stroke 1987    Right brain stroke  . Neuropathy 2013  . Osteoporosis 2013  . Sleep apnea     uses CPAP  . GERD (gastroesophageal reflux disease)   . H/O hiatal hernia   . Irritable bowel syndrome (IBS)   . Myocardial infarction     silent inferior MI; patient denies MI history (03/17/13)   . Anemia   . Carotid artery occlusion     Social History History  Substance Use Topics  . Smoking status: Former Smoker -- 1.00 packs/day for 40 years    Types: Cigarettes    Quit date: 03/26/2009  . Smokeless tobacco: Never Used  . Alcohol Use: No    Family History Family History  Problem Relation Age of Onset  . Cancer Mother 53    pancreatic  . Hyperlipidemia Mother   . Stroke Father 3  .  Deep vein thrombosis Father   . Hyperlipidemia Father   . Hypertension Father     Surgical History Past Surgical History  Procedure Laterality Date  . Carotid endarterectomy  1994 & redo 2001    Left  . Pr vein bypass graft,aorto-fem-pop  1998  . Iliac artery stent    . Fracture surgery  2013    Right   foo  t X's 2  . Tonsillectomy    . Posterior lumbar fusion  Aug. 14, 2014    Level 1  . Spine surgery  2014    Allergies  Allergen Reactions  . Dilaudid Cough Nausea Only  . Food     "garlic, tomatoes, acid foods"= mouth blisters  . Hydrocodone Nausea Only  . Oxycodone Nausea Only  . Ultram [Tramadol Hcl] Nausea Only  . Penicillins Rash  . Sulfa Drugs Cross Reactors Rash    Current Outpatient Prescriptions  Medication Sig Dispense Refill  . Ascorbic Acid (VITAMIN C) 100 MG tablet Take 100 mg by mouth daily.    Marland Kitchen aspirin 81 MG tablet Take 81 mg by mouth daily.      Marland Kitchen atorvastatin (LIPITOR) 40 MG tablet Take 40 mg by mouth daily.      . Calcium Carbonate-Vitamin D (CALCIUM-D) 600-400 MG-UNIT TABS Take by mouth.    . carvedilol (COREG) 3.125 MG tablet Take 3.125 mg by mouth  2 (two) times daily with a meal.    . Cholecalciferol (D-3-5) 5000 UNITS capsule Take 5,000 Units by mouth daily.    . clopidogrel (PLAVIX) 75 MG tablet Take 75 mg by mouth daily.      Marland Kitchen dexlansoprazole (DEXILANT) 60 MG capsule Take 60 mg by mouth daily.    Marland Kitchen glimepiride (AMARYL) 2 MG tablet Take 2 mg by mouth daily before breakfast.      . IRON, FERROUS GLUCONATE, PO Take 40 mg by mouth 2 (two) times daily.    . Liraglutide (VICTOZA [place]) Inject 1.8 mLs into the skin daily.     Marland Kitchen losartan (COZAAR) 100 MG tablet Take 100 mg by mouth Daily.    . metFORMIN (GLUCOPHAGE-XR) 500 MG 24 hr tablet Take 1,000 mg by mouth 2 (two) times daily.     . Multiple Vitamin (MULTIVITAMIN) capsule Take 1 capsule by mouth daily.      . niacin (NIASPAN) 500 MG CR tablet Take 500 mg by mouth at bedtime.      . pantoprazole  (PROTONIX) 40 MG tablet Take 40 mg by mouth daily.    . sucralfate (CARAFATE) 1 G tablet Take 1 tablet (1 g total) by mouth 4 (four) times daily -  with meals and at bedtime. (Patient not taking: Reported on 07/11/2014) 120 tablet 0  . vitamin E 400 UNIT capsule Take 400 Units by mouth daily.       No current facility-administered medications for this visit.    Review of Systems : See HPI for pertinent positives and negatives.  Physical Examination  Filed Vitals:   01/16/15 1124 01/16/15 1128  BP: 127/84 124/73  Pulse: 92 88  Resp:  16  Height:  6' (1.829 m)  Weight:  220 lb (99.791 kg)  SpO2:  97%   Body mass index is 29.83 kg/(m^2).  General: WDWN in NAD Gait: Normal HENT: WNL Eyes: Pupils equal Pulmonary: normal non-labored breathing , without Rales, rhonchi, wheezing Cardiac: RRR, no murmurs detected  Abdomen: soft, NT, no palpable masses Skin: no rashes, ulcers noted; no Gangrene , no cellulitis.  VASCULAR EXAM  Carotid Bruits Left Right   Negative Negative  Radial pulses are 2+ palpable and =. Aorta is not palpable.   VASCULAR EXAM: Extremities without ischemic changes  without Gangrene; without open wounds; feet are well perfused.     LE Pulses LEFT RIGHT   FEMORAL  palpable  not palpable    POPLITEAL not palpable  not palpable   POSTERIOR TIBIAL not palpable  not palpable    DORSALIS PEDIS  ANTERIOR TIBIAL not palpable  not palpable     Musculoskeletal: no muscle wasting or atrophy; no peripheral edema Neurologic: A&O X 3; Appropriate Affect ;  SENSATION: normal; MOTOR FUNCTION: 5/5 Symmetric, CN 2-12 intact except for some hearing loss, Speech is fluent/normal         ABI (Date: 07/11/2014)  R: 0.74  (01/10/14, 0.75), DP: 0.67, monophasic, PT: 0.74, biphasic, TBI: 0.58  L: 0.73 (0.75), DP: 0.73, biphasic, PT: 0.72, biphasic, TBI: 0.61   Non-Invasive Vascular Imaging CAROTID DUPLEX 01/16/2015   CEREBROVASCULAR DUPLEX EVALUATION    INDICATION: Carotid artery disease    PREVIOUS INTERVENTION(S): Left carotid endarterectomy 1994 with revision 2001    DUPLEX EXAM: Carotid duplex    RIGHT  LEFT  Peak Systolic Velocities (cm/s) End Diastolic Velocities (cm/s) Plaque LOCATION Peak Systolic Velocities (cm/s) End Diastolic Velocities (cm/s) Plaque  97 18  CCA PROXIMAL 133 29 -  89 15  CCA MID 85 16 -  75 18  CCA DISTAL 100 23 HT  232 21  ECA 79 10 -  215 63  ICA PROXIMAL 64 16 -  127 33  ICA MID 47 16 -  70 18  ICA DISTAL 50 15 -    2.4 ICA / CCA Ratio (PSV) N/A  Antegrade Vertebral Flow Antegrade  123456 Brachial Systolic Pressure (mmHg) [id number]  Triphasic Brachial Artery Waveforms Triphasic    Plaque Morphology:  HM = Homogeneous, HT = Heterogeneous, CP = Calcific Plaque, SP = Smooth Plaque, IP = Irregular Plaque  ADDITIONAL FINDINGS:     IMPRESSION: 1. 50 - 69%  right internal carotid artery stenosis, upper end of range 2. Patent left carotid endarterectomy with no evidence for restenosis.    Compared to the previous exam:  Unable to obtain increased velocity on the right.      Assessment: Bradley [name] is a 69 y.o. male who is s/p aortobifemoral bypass for lower extremity arterial occlusive disease in 1998. He underwent a left carotid endarterectomy in 1994 with a redo for recurrent stenosis in 2001. He had a stroke at age 42, then subsequent TIA's until his last TIA in 2001. He has no claudication with walking, no non healing wounds.  His atherosclerotic risk factors include DM that is almost in control and former smoker.  Today's carotid Duplex suggests 50 - 69%  right internal carotid artery stenosis, upper end of range, and patent left carotid endarterectomy with no evidence  for restenosis. Unable to obtain increased velocity on the right ICA compared to previous carotid Duplex on 07/11/14.  ABI's from six months ago indicate moderate bilateral arterial occlusive disease.   Plan: Follow-up in 6 months with Carotid Duplex and ABI's (already scheduled).   I discussed in depth with the patient the nature of atherosclerosis, and emphasized the importance of maximal medical management including strict control of blood pressure, blood glucose, and lipid levels, obtaining regular exercise, and continued cessation of smoking.  The patient is aware that without maximal medical management the underlying atherosclerotic disease process will progress, limiting the benefit of any interventions. The patient was given information about stroke prevention and what symptoms should prompt the patient to seek immediate medical care. Thank you for allowing [place] to participate in this patient's care.  Clemon Chambers, RN, MSN, FNP-C [facility] Office: [phone]  Clinic Physician: Early  01/16/2015 11:51 AM

## 2015-01-17 NOTE — Addendum Note (Signed)
Addended by: Dorthula Rue L on: 01/17/2015 04:38 PM   Modules accepted: Orders

## 2015-04-29 DIAGNOSIS — I10 Essential (primary) hypertension: Secondary | ICD-10-CM | POA: Insufficient documentation

## 2015-04-29 DIAGNOSIS — E785 Hyperlipidemia, unspecified: Secondary | ICD-10-CM | POA: Insufficient documentation

## 2015-04-29 DIAGNOSIS — E1169 Type 2 diabetes mellitus with other specified complication: Secondary | ICD-10-CM | POA: Insufficient documentation

## 2015-04-29 DIAGNOSIS — E118 Type 2 diabetes mellitus with unspecified complications: Secondary | ICD-10-CM

## 2015-04-29 HISTORY — DX: Essential (primary) hypertension: I10

## 2015-04-29 HISTORY — DX: Type 2 diabetes mellitus with unspecified complications: E11.8

## 2015-04-29 NOTE — Progress Notes (Signed)
Cardiology Office Note   Date:  04/30/2015   ID:  Bradley [name], DOB 16-Dec-1945, MRN [id number]  PCP:   Melinda Crutch, MD  Cardiologist:  Sinclair Grooms, MD   Chief Complaint  Patient presents with  . Coronary Artery Disease      History of Present Illness: Bradley [name] is a 69 y.o. male who presents for abdominal aortic aneurysm and peripheral arterial disease with claudication, carotid artery obstructive disease, prior CVA, hypertension, type 2 diabetes, hyperlipidemia, obstructive sleep apnea, and chronic right bundle branch block.  The patient is referred back for cardiac evaluation because of progressive dyspnea on exertion over the past 6-12 months. He walks on a treadmill on a regular basis. Walking inclines upstairs causes severe shortness of breath. He has difficulty with dyspnea tried up on his close. He is a prior smoker. He denies orthopnea PND. Is no peripheral edema.    Past Medical History  Diagnosis Date  . Hyperlipidemia   . Hypertension   . Diabetes mellitus age 71  . Peripheral vascular disease   . Arthritis   . Reflux esophagitis   . Stroke 1987    Right brain stroke  . Neuropathy 2013  . Osteoporosis 2013  . Sleep apnea     uses CPAP  . GERD (gastroesophageal reflux disease)   . H/O hiatal hernia   . Irritable bowel syndrome (IBS)   . Myocardial infarction     silent inferior MI; patient denies MI history (03/17/13)   . Anemia   . Carotid artery occlusion     Past Surgical History  Procedure Laterality Date  . Carotid endarterectomy  1994 & redo 2001    Left  . Pr vein bypass graft,aorto-fem-pop  1998  . Iliac artery stent    . Fracture surgery  2013    Right   foo  t X's 2  . Tonsillectomy    . Posterior lumbar fusion  Aug. 14, 2014    Level 1  . Spine surgery  2014     Current Outpatient Prescriptions  Medication Sig Dispense Refill  . Ascorbic Acid (VITAMIN C) 100 MG tablet Take 100 mg by mouth daily.    Marland Kitchen aspirin 81 MG  tablet Take 81 mg by mouth daily.      Marland Kitchen atorvastatin (LIPITOR) 40 MG tablet Take 40 mg by mouth daily.      . Calcium Carbonate-Vitamin D (CALCIUM-D) 600-400 MG-UNIT TABS Take by mouth.    . carvedilol (COREG) 3.125 MG tablet Take 3.125 mg by mouth 2 (two) times daily with a meal.    . Cholecalciferol (D-3-5) 5000 UNITS capsule Take 5,000 Units by mouth daily.    . clopidogrel (PLAVIX) 75 MG tablet Take 75 mg by mouth daily.      Marland Kitchen dexlansoprazole (DEXILANT) 60 MG capsule Take 60 mg by mouth daily.    Marland Kitchen glimepiride (AMARYL) 2 MG tablet Take 2 mg by mouth daily before breakfast.      . IRON, FERROUS GLUCONATE, PO Take 40 mg by mouth 2 (two) times daily.    . Liraglutide (VICTOZA [place]) Inject 1.8 mLs into the skin daily.     Marland Kitchen losartan (COZAAR) 100 MG tablet Take 100 mg by mouth Daily.    . metFORMIN (GLUCOPHAGE-XR) 500 MG 24 hr tablet Take 1,000 mg by mouth 2 (two) times daily.     . Multiple Vitamin (MULTIVITAMIN) capsule Take 1 capsule by mouth daily.      Marland Kitchen  vitamin E 400 UNIT capsule Take 400 Units by mouth daily.       No current facility-administered medications for this visit.    Allergies:   Dilaudid cough; Food; Hydrocodone; Oxycodone; Ultram; Penicillins; and Sulfa drugs cross reactors    Social History:  The patient  reports that he quit smoking about 6 years ago. His smoking use included Cigarettes. He has a 40 pack-year smoking history. He has never used smokeless tobacco. He reports that he does not drink alcohol or use illicit drugs.   Family History:  The patient's family history includes Cancer (age of onset: 11) in his mother; Deep vein thrombosis in his father; Hyperlipidemia in his father and mother; Hypertension in his father; Stroke (age of onset: 9) in his father.    ROS:  Please see the history of present illness.   Otherwise, review of systems are positive for weight gain, recent chills, excessive fatigue, sweating, nausea, vomiting, wheezing, back pain, left flank  pain, and difficulty with balance..   All other systems are reviewed and negative.    PHYSICAL EXAM: VS:  BP 130/80 mmHg  Pulse 88  Ht 5' 11.5" (1.816 m)  Wt 96.344 kg (212 lb 6.4 oz)  BMI 29.21 kg/m2 , BMI Body mass index is 29.21 kg/(m^2). GEN: Well nourished, well developed, in no acute distress. Morbidly obese. HEENT: normal Neck: no JVD, carotid bruits, or masses Cardiac: RRR.  There is no murmur, rub, or gallop. There is no edema. Unable to palpate popliteal and pedal pulses bilaterally. Respiratory:  clear to auscultation bilaterally, normal work of breathing. GI: soft, nontender, nondistended, + BS MS: no deformity or atrophy Skin: warm and dry, no rash Neuro:  Strength and sensation are intact Psych: euthymic mood, full affect   EKG:  EKG is ordered today. The ekg reveals normal sinus rhythm with right bundle branch block. No change from prior EKG 2013 said by Dr. Melinda Crutch.   Recent Labs: No results found for requested labs within last 365 days.    Lipid Panel No results found for: CHOL, TRIG, HDL, CHOLHDL, VLDL, LDLCALC, LDLDIRECT    Wt Readings from Last 3 Encounters:  04/30/15 96.344 kg (212 lb 6.4 oz)  01/16/15 99.791 kg (220 lb)  07/11/14 100.245 kg (221 lb)      Other studies Reviewed: Additional studies/ records that were reviewed today include: Prior abdominal aortic aneurysm repair, left carotid endarterectomy 2, and prior cardiac evaluation including nuclear stress test at [facility] [place] nonischemic.Marland Kitchen The findings include chronic left bundle branch block.    ASSESSMENT AND PLAN:  1. Peripheral vascular disease Claudication  2. Essential hypertension Controlled  3. Hyperlipidemia On therapy  4. DM type 2, controlled, with complication Managed by primary  5. Dyspnea This could be related to myocardial ischemia, physical deconditioning, pulmonary disease, or other conditions. Given widespread vascular obstructive disease, we need to rule out  ischemic heart disease.  6. Prior history of CVA.  Current medicines are reviewed at length with the patient today.  The patient has the following concerns regarding medicines: None.  The following changes/actions have been instituted:    Stress myocardial perfusion study with permission to convert to [place] if needed  2-D Doppler echocardiogram  BNP  Labs/ tests ordered today include:   Orders Placed This Encounter  Procedures  . B Nat Peptide  . Myocardial Perfusion Imaging  . EKG 12-Lead  . Echocardiogram     Disposition:   FU with HS in when necessary depending  upon workup   Signed, Sinclair Grooms, MD  04/30/2015 9:33 AM    [address], [place], [place]  [address] Phone: [phone]; Fax: [phone]

## 2015-04-30 ENCOUNTER — Ambulatory Visit (INDEPENDENT_AMBULATORY_CARE_PROVIDER_SITE_OTHER): Payer: PPO | Admitting: Interventional Cardiology

## 2015-04-30 ENCOUNTER — Encounter: Payer: Self-pay | Admitting: Interventional Cardiology

## 2015-04-30 VITALS — BP 130/80 | HR 88 | Ht 71.5 in | Wt 212.4 lb

## 2015-04-30 DIAGNOSIS — E785 Hyperlipidemia, unspecified: Secondary | ICD-10-CM

## 2015-04-30 DIAGNOSIS — I10 Essential (primary) hypertension: Secondary | ICD-10-CM | POA: Diagnosis not present

## 2015-04-30 DIAGNOSIS — I4510 Unspecified right bundle-branch block: Secondary | ICD-10-CM

## 2015-04-30 DIAGNOSIS — R0600 Dyspnea, unspecified: Secondary | ICD-10-CM | POA: Insufficient documentation

## 2015-04-30 DIAGNOSIS — I739 Peripheral vascular disease, unspecified: Secondary | ICD-10-CM

## 2015-04-30 DIAGNOSIS — E118 Type 2 diabetes mellitus with unspecified complications: Secondary | ICD-10-CM

## 2015-04-30 HISTORY — DX: Unspecified right bundle-branch block: I45.10

## 2015-04-30 LAB — BRAIN NATRIURETIC PEPTIDE: Pro B Natriuretic peptide (BNP): 6 pg/mL (ref 0.0–100.0)

## 2015-04-30 NOTE — Patient Instructions (Signed)
Medication Instructions:  Your physician recommends that you continue on your current medications as directed. Please refer to the Current Medication list given to you today.   Labwork: Bnp toady  Testing/Procedures: Your physician has requested that you have an echocardiogram. Echocardiography is a painless test that uses sound waves to create images of your heart. It provides your doctor with information about the size and shape of your heart and how well your heart's chambers and valves are working. This procedure takes approximately one hour. There are no restrictions for this procedure.   Your physician has requested that you have en exercise stress myoview. For further information please visit [email or web address]. Please follow instruction sheet, as given.   Follow-Up: Your physician recommends that you schedule a follow-up appointment pending test results   Any Other Special Instructions Will Be Listed Below (If Applicable).

## 2015-05-01 ENCOUNTER — Telehealth: Payer: Self-pay

## 2015-05-01 NOTE — Telephone Encounter (Signed)
Pt aware of lab results. BNP is normal. Therefore no fluid buildup and lungs to explain shortness of breath. Pt verbalized understanding.

## 2015-05-01 NOTE — Telephone Encounter (Signed)
-----   Message from Belva Crome, MD sent at 04/30/2015  5:15 PM EDT ----- BNP is normal. Therefore no fluid buildup and lungs to explain shortness of breath.

## 2015-05-03 ENCOUNTER — Telehealth (HOSPITAL_COMMUNITY): Payer: Self-pay | Admitting: *Deleted

## 2015-05-03 NOTE — Telephone Encounter (Signed)
Left message on voicemail in reference to upcoming appointment scheduled for 05/08/15. Phone number given for a call back so details instructions can be given. Hubbard Robinson, RN

## 2015-05-04 ENCOUNTER — Telehealth (HOSPITAL_COMMUNITY): Payer: Self-pay

## 2015-05-04 NOTE — Telephone Encounter (Signed)
Patient given detailed instructions per Myocardial Perfusion Study Information Sheet for test on 05/08/15 at 9:30. Patient notified to arrive 15 minutes early and that it is imperative to arrive on time for appointment to keep from having the test rescheduled.  If you need to cancel or reschedule your appointment, please call the office within 24 hours of your appointment. Failure to do so may result in a cancellation of your appointment, and a $50 no show fee. Patient verbalized understanding. Annye Rusk

## 2015-05-07 ENCOUNTER — Other Ambulatory Visit: Payer: Self-pay | Admitting: Gastroenterology

## 2015-05-07 DIAGNOSIS — R1084 Generalized abdominal pain: Secondary | ICD-10-CM

## 2015-05-08 ENCOUNTER — Ambulatory Visit (HOSPITAL_BASED_OUTPATIENT_CLINIC_OR_DEPARTMENT_OTHER): Payer: PPO

## 2015-05-08 ENCOUNTER — Ambulatory Visit (HOSPITAL_COMMUNITY): Payer: PPO | Attending: Cardiology

## 2015-05-08 ENCOUNTER — Other Ambulatory Visit: Payer: Self-pay

## 2015-05-08 DIAGNOSIS — I739 Peripheral vascular disease, unspecified: Secondary | ICD-10-CM | POA: Insufficient documentation

## 2015-05-08 DIAGNOSIS — I358 Other nonrheumatic aortic valve disorders: Secondary | ICD-10-CM | POA: Insufficient documentation

## 2015-05-08 DIAGNOSIS — I6529 Occlusion and stenosis of unspecified carotid artery: Secondary | ICD-10-CM | POA: Diagnosis not present

## 2015-05-08 DIAGNOSIS — E119 Type 2 diabetes mellitus without complications: Secondary | ICD-10-CM | POA: Diagnosis not present

## 2015-05-08 DIAGNOSIS — E785 Hyperlipidemia, unspecified: Secondary | ICD-10-CM | POA: Insufficient documentation

## 2015-05-08 DIAGNOSIS — I77810 Thoracic aortic ectasia: Secondary | ICD-10-CM | POA: Diagnosis not present

## 2015-05-08 DIAGNOSIS — I4510 Unspecified right bundle-branch block: Secondary | ICD-10-CM | POA: Insufficient documentation

## 2015-05-08 DIAGNOSIS — R0600 Dyspnea, unspecified: Secondary | ICD-10-CM | POA: Diagnosis not present

## 2015-05-08 DIAGNOSIS — I10 Essential (primary) hypertension: Secondary | ICD-10-CM | POA: Diagnosis not present

## 2015-05-08 LAB — MYOCARDIAL PERFUSION IMAGING
Estimated workload: 7 METS
Exercise duration (min): 5 min
Exercise duration (sec): 1 s
LV dias vol: 82 mL
LV sys vol: 39 mL
MPHR: 149 {beats}/min
Peak HR: 144 {beats}/min
Percent HR: 96 %
RATE: 0.24
RPE: 18
Rest HR: 91 {beats}/min
SDS: 3
SRS: 5
SSS: 8
TID: 1.01

## 2015-05-08 MED ORDER — TECHNETIUM TC 99M SESTAMIBI GENERIC - CARDIOLITE
10.8000 | Freq: Once | INTRAVENOUS | Status: AC | PRN
  Administered 2015-05-08: 11 via INTRAVENOUS

## 2015-05-08 MED ORDER — TECHNETIUM TC 99M SESTAMIBI GENERIC - CARDIOLITE
32.7000 | Freq: Once | INTRAVENOUS | Status: AC | PRN
  Administered 2015-05-08: 32.7 via INTRAVENOUS

## 2015-05-09 ENCOUNTER — Telehealth: Payer: Self-pay

## 2015-05-09 ENCOUNTER — Ambulatory Visit
Admission: RE | Admit: 2015-05-09 | Discharge: 2015-05-09 | Disposition: A | Discharge status: Home / Self Care | Payer: PPO | Source: Ambulatory Visit | Attending: Gastroenterology | Admitting: Gastroenterology

## 2015-05-09 DIAGNOSIS — R1084 Generalized abdominal pain: Secondary | ICD-10-CM

## 2015-05-09 DIAGNOSIS — R9439 Abnormal result of other cardiovascular function study: Secondary | ICD-10-CM

## 2015-05-09 DIAGNOSIS — Z01812 Encounter for preprocedural laboratory examination: Secondary | ICD-10-CM

## 2015-05-09 MED ORDER — IOPAMIDOL (ISOVUE-300) INJECTION 61%
125.0000 mL | Freq: Once | INTRAVENOUS | Status: AC | PRN
  Administered 2015-05-09: 125 mL via INTRAVENOUS

## 2015-05-09 NOTE — Telephone Encounter (Signed)
-----   Message from Belva Crome, MD sent at 05/08/2015  7:43 PM EDT ----- Intermediate risk stress myocardial perfusion study. This test suggests the possibility of coronary artery disease. We need to discuss further evaluation such as coronary angiography. If he will allow, set the patient up for left heart catheterization with coronary angiography to be performed on my next cath day in October. Living no if he has questions or concerns about proceeding and I will call him and speak about this to answer questions and further explain.

## 2015-05-09 NOTE — Telephone Encounter (Signed)
Called to give pt echo and myoview results. [email or web address]

## 2015-05-10 NOTE — Telephone Encounter (Signed)
-----   Message from Belva Crome, MD sent at 05/08/2015  7:43 PM EDT ----- Intermediate risk stress myocardial perfusion study. This test suggests the possibility of coronary artery disease. We need to discuss further evaluation such as coronary angiography. If he will allow, set the patient up for left heart catheterization with coronary angiography to be performed on my next cath day in October. Living no if he has questions or concerns about proceeding and I will call him and speak about this to answer questions and further explain.

## 2015-05-10 NOTE — Telephone Encounter (Signed)
Pt aware cardiac cath scheduled on 10/6 @ 9am with Dr.Smith. ptr will come to the office on 9/30 for pre procedure lab, and got to GI for his cxr. Pt given verbal pre-procedure instructions, pt will pick up written instructions when he come to the office tomorrow for labs. Pt verbalized understanding to all info given

## 2015-05-10 NOTE — Telephone Encounter (Signed)
Pt aware of myoview results with verbal understanding. Pt is agreeable with proceeding with cardiac cath.  Adv pt I will call [facility] to schedule his cath and call back with more info.

## 2015-05-11 ENCOUNTER — Ambulatory Visit
Admission: RE | Admit: 2015-05-11 | Discharge: 2015-05-11 | Disposition: A | Discharge status: Home / Self Care | Payer: PPO | Source: Ambulatory Visit | Attending: Interventional Cardiology | Admitting: Interventional Cardiology

## 2015-05-11 ENCOUNTER — Other Ambulatory Visit (INDEPENDENT_AMBULATORY_CARE_PROVIDER_SITE_OTHER): Payer: PPO | Admitting: *Deleted

## 2015-05-11 DIAGNOSIS — Z01812 Encounter for preprocedural laboratory examination: Secondary | ICD-10-CM

## 2015-05-11 DIAGNOSIS — R931 Abnormal findings on diagnostic imaging of heart and coronary circulation: Secondary | ICD-10-CM | POA: Diagnosis not present

## 2015-05-11 DIAGNOSIS — R9439 Abnormal result of other cardiovascular function study: Secondary | ICD-10-CM

## 2015-05-11 LAB — CBC WITH DIFFERENTIAL/PLATELET
Basophils Absolute: 0 10*3/uL (ref 0.0–0.1)
Basophils Relative: 0.4 % (ref 0.0–3.0)
Eosinophils Absolute: 0.1 10*3/uL (ref 0.0–0.7)
Eosinophils Relative: 1.8 % (ref 0.0–5.0)
HCT: 40 % (ref 39.0–52.0)
Hemoglobin: 13.4 g/dL (ref 13.0–17.0)
Lymphocytes Relative: 20.9 % (ref 12.0–46.0)
Lymphs Abs: 1.6 10*3/uL (ref 0.7–4.0)
MCHC: 33.6 g/dL (ref 30.0–36.0)
MCV: 96.3 fl (ref 78.0–100.0)
Monocytes Absolute: 0.6 10*3/uL (ref 0.1–1.0)
Monocytes Relative: 7.4 % (ref 3.0–12.0)
Neutro Abs: 5.3 10*3/uL (ref 1.4–7.7)
Neutrophils Relative %: 69.5 % (ref 43.0–77.0)
Platelets: 285 10*3/uL (ref 150.0–400.0)
RBC: 4.15 Mil/uL — ABNORMAL LOW (ref 4.22–5.81)
RDW: 13.9 % (ref 11.5–15.5)
WBC: 7.7 10*3/uL (ref 4.0–10.5)

## 2015-05-11 LAB — BASIC METABOLIC PANEL
BUN: 18 mg/dL (ref 6–23)
CO2: 27 mEq/L (ref 19–32)
Calcium: 9.6 mg/dL (ref 8.4–10.5)
Chloride: 102 mEq/L (ref 96–112)
Creatinine, Ser: 1.45 mg/dL (ref 0.40–1.50)
GFR: 50.95 mL/min — ABNORMAL LOW (ref >60.00)
Glucose, Bld: 152 mg/dL — ABNORMAL HIGH (ref 70–99)
Potassium: 4.3 mEq/L (ref 3.5–5.1)
Sodium: 137 mEq/L (ref 135–145)

## 2015-05-11 LAB — PROTIME-INR
INR: 1 ratio (ref 0.8–1.0)
Prothrombin Time: 11.1 s (ref 9.6–13.1)

## 2015-05-12 ENCOUNTER — Other Ambulatory Visit: Payer: Self-pay | Admitting: Interventional Cardiology

## 2015-05-14 ENCOUNTER — Telehealth: Payer: Self-pay | Admitting: Interventional Cardiology

## 2015-05-14 NOTE — Telephone Encounter (Signed)
Returned pt call. Adv him the my chart date was an error. Pt cath is scheduled for 10/6 @ 9am, pt to report to [facility] @ 7am. Pt verbalized understanding.

## 2015-05-14 NOTE — Telephone Encounter (Signed)
New message      Pt is scheduled for a cath on 05-17-15.  He got a msg from [place] stating the cath is 05-15-15.  Which date is the cath?

## 2015-05-17 ENCOUNTER — Encounter (HOSPITAL_COMMUNITY)
Admission: RE | Disposition: A | Discharge status: Home / Self Care | Payer: Self-pay | Source: Ambulatory Visit | Attending: Interventional Cardiology

## 2015-05-17 ENCOUNTER — Other Ambulatory Visit: Payer: Self-pay | Admitting: *Deleted

## 2015-05-17 ENCOUNTER — Encounter (HOSPITAL_COMMUNITY): Payer: Self-pay | Admitting: General Practice

## 2015-05-17 ENCOUNTER — Ambulatory Visit (HOSPITAL_COMMUNITY)
Admission: RE | Admit: 2015-05-17 | Discharge: 2015-05-18 | Disposition: A | Discharge status: Home / Self Care | Payer: PPO | Source: Ambulatory Visit | Attending: Interventional Cardiology | Admitting: Interventional Cardiology

## 2015-05-17 DIAGNOSIS — G4733 Obstructive sleep apnea (adult) (pediatric): Secondary | ICD-10-CM | POA: Diagnosis not present

## 2015-05-17 DIAGNOSIS — Z87891 Personal history of nicotine dependence: Secondary | ICD-10-CM | POA: Insufficient documentation

## 2015-05-17 DIAGNOSIS — Z79899 Other long term (current) drug therapy: Secondary | ICD-10-CM | POA: Diagnosis not present

## 2015-05-17 DIAGNOSIS — I2584 Coronary atherosclerosis due to calcified coronary lesion: Secondary | ICD-10-CM | POA: Insufficient documentation

## 2015-05-17 DIAGNOSIS — E1122 Type 2 diabetes mellitus with diabetic chronic kidney disease: Secondary | ICD-10-CM | POA: Diagnosis not present

## 2015-05-17 DIAGNOSIS — D649 Anemia, unspecified: Secondary | ICD-10-CM | POA: Insufficient documentation

## 2015-05-17 DIAGNOSIS — I129 Hypertensive chronic kidney disease with stage 1 through stage 4 chronic kidney disease, or unspecified chronic kidney disease: Secondary | ICD-10-CM | POA: Insufficient documentation

## 2015-05-17 DIAGNOSIS — Z7982 Long term (current) use of aspirin: Secondary | ICD-10-CM | POA: Diagnosis not present

## 2015-05-17 DIAGNOSIS — I10 Essential (primary) hypertension: Secondary | ICD-10-CM | POA: Diagnosis present

## 2015-05-17 DIAGNOSIS — Z7902 Long term (current) use of antithrombotics/antiplatelets: Secondary | ICD-10-CM | POA: Insufficient documentation

## 2015-05-17 DIAGNOSIS — I739 Peripheral vascular disease, unspecified: Secondary | ICD-10-CM | POA: Diagnosis present

## 2015-05-17 DIAGNOSIS — K219 Gastro-esophageal reflux disease without esophagitis: Secondary | ICD-10-CM | POA: Diagnosis not present

## 2015-05-17 DIAGNOSIS — K5730 Diverticulosis of large intestine without perforation or abscess without bleeding: Secondary | ICD-10-CM

## 2015-05-17 DIAGNOSIS — E1140 Type 2 diabetes mellitus with diabetic neuropathy, unspecified: Secondary | ICD-10-CM | POA: Diagnosis not present

## 2015-05-17 DIAGNOSIS — N183 Chronic kidney disease, stage 3 (moderate): Secondary | ICD-10-CM | POA: Diagnosis not present

## 2015-05-17 DIAGNOSIS — Z8673 Personal history of transient ischemic attack (TIA), and cerebral infarction without residual deficits: Secondary | ICD-10-CM | POA: Diagnosis not present

## 2015-05-17 DIAGNOSIS — K589 Irritable bowel syndrome without diarrhea: Secondary | ICD-10-CM | POA: Diagnosis not present

## 2015-05-17 DIAGNOSIS — E785 Hyperlipidemia, unspecified: Secondary | ICD-10-CM | POA: Diagnosis not present

## 2015-05-17 DIAGNOSIS — R0600 Dyspnea, unspecified: Secondary | ICD-10-CM | POA: Diagnosis present

## 2015-05-17 DIAGNOSIS — I2510 Atherosclerotic heart disease of native coronary artery without angina pectoris: Secondary | ICD-10-CM

## 2015-05-17 DIAGNOSIS — R9439 Abnormal result of other cardiovascular function study: Secondary | ICD-10-CM | POA: Diagnosis present

## 2015-05-17 DIAGNOSIS — I252 Old myocardial infarction: Secondary | ICD-10-CM | POA: Diagnosis not present

## 2015-05-17 DIAGNOSIS — E1169 Type 2 diabetes mellitus with other specified complication: Secondary | ICD-10-CM | POA: Diagnosis present

## 2015-05-17 DIAGNOSIS — I4510 Unspecified right bundle-branch block: Secondary | ICD-10-CM | POA: Diagnosis present

## 2015-05-17 DIAGNOSIS — R9431 Abnormal electrocardiogram [ECG] [EKG]: Secondary | ICD-10-CM

## 2015-05-17 HISTORY — DX: Atherosclerotic heart disease of native coronary artery without angina pectoris: I25.10

## 2015-05-17 HISTORY — DX: Diverticulosis of large intestine without perforation or abscess without bleeding: K57.30

## 2015-05-17 HISTORY — PX: CARDIAC CATHETERIZATION: SHX172

## 2015-05-17 LAB — CBC
HCT: 36.5 % — ABNORMAL LOW (ref 39.0–52.0)
Hemoglobin: 12 g/dL — ABNORMAL LOW (ref 13.0–17.0)
MCH: 31.9 pg (ref 26.0–34.0)
MCHC: 32.9 g/dL (ref 30.0–36.0)
MCV: 97.1 fL (ref 78.0–100.0)
Platelets: 229 10*3/uL (ref 150–400)
RBC: 3.76 MIL/uL — ABNORMAL LOW (ref 4.22–5.81)
RDW: 13 % (ref 11.5–15.5)
WBC: 6 10*3/uL (ref 4.0–10.5)

## 2015-05-17 LAB — GLUCOSE, CAPILLARY
Glucose-Capillary: 148 mg/dL — ABNORMAL HIGH (ref 65–99)
Glucose-Capillary: 140 mg/dL — ABNORMAL HIGH (ref 65–99)
Glucose-Capillary: 152 mg/dL — ABNORMAL HIGH (ref 65–99)
Glucose-Capillary: 231 mg/dL — ABNORMAL HIGH (ref 65–99)
Glucose-Capillary: 203 mg/dL — ABNORMAL HIGH (ref 65–99)

## 2015-05-17 LAB — CREATININE, SERUM
Creatinine, Ser: 1.48 mg/dL — ABNORMAL HIGH (ref 0.61–1.24)
GFR calc Af Amer: 53 mL/min — ABNORMAL LOW (ref >60)
GFR calc non Af Amer: 46 mL/min — ABNORMAL LOW (ref >60)

## 2015-05-17 LAB — PLATELET INHIBITION P2Y12: Platelet Function  P2Y12: 214 [PRU] (ref 194–418)

## 2015-05-17 SURGERY — LEFT HEART CATH AND CORONARY ANGIOGRAPHY
Anesthesia: LOCAL

## 2015-05-17 MED ORDER — ADULT MULTIVITAMIN W/MINERALS CH
1.0000 | ORAL_TABLET | Freq: Every day | ORAL | Status: DC
  Administered 2015-05-18: 1 via ORAL
  Filled 2015-05-17: qty 1

## 2015-05-17 MED ORDER — PANTOPRAZOLE SODIUM 40 MG PO TBEC
40.0000 mg | DELAYED_RELEASE_TABLET | Freq: Every day | ORAL | Status: DC
  Administered 2015-05-17 – 2015-05-18 (×2): 40 mg via ORAL
  Filled 2015-05-17 (×2): qty 1

## 2015-05-17 MED ORDER — IRON (FERROUS GLUCONATE) 256 (28 FE) MG PO TABS: 40.0000 mg | ORAL_TABLET | Freq: Two times a day (BID) | ORAL | Status: DC

## 2015-05-17 MED ORDER — CHOLECALCIFEROL 125 MCG (5000 UT) PO CAPS: 5000.0000 [IU] | ORAL_CAPSULE | Freq: Every day | ORAL | Status: DC

## 2015-05-17 MED ORDER — ASPIRIN 81 MG PO CHEW
CHEWABLE_TABLET | ORAL | Status: AC
  Filled 2015-05-17: qty 1

## 2015-05-17 MED ORDER — SODIUM CHLORIDE 0.9 % WEIGHT BASED INFUSION
3.0000 mL/kg/h | INTRAVENOUS | Status: AC
  Administered 2015-05-17: 3 mL/kg/h via INTRAVENOUS

## 2015-05-17 MED ORDER — SODIUM CHLORIDE 0.9 % IV SOLN: 250.0000 mL | INTRAVENOUS | Status: DC | PRN

## 2015-05-17 MED ORDER — VITAMIN E 180 MG (400 UNIT) PO CAPS
400.0000 [IU] | ORAL_CAPSULE | Freq: Every day | ORAL | Status: DC
  Administered 2015-05-18: 400 [IU] via ORAL
  Filled 2015-05-17: qty 1

## 2015-05-17 MED ORDER — HEPARIN SODIUM (PORCINE) 1000 UNIT/ML IJ SOLN
INTRAMUSCULAR | Status: DC | PRN
  Administered 2015-05-17: 4500 [IU] via INTRAVENOUS

## 2015-05-17 MED ORDER — ENOXAPARIN SODIUM 40 MG/0.4ML ~~LOC~~ SOLN
40.0000 mg | SUBCUTANEOUS | Status: DC
  Administered 2015-05-18: 40 mg via SUBCUTANEOUS
  Filled 2015-05-17: qty 0.4

## 2015-05-17 MED ORDER — LIRAGLUTIDE 18 MG/3ML ~~LOC~~ SOPN: 1.8000 mg | PEN_INJECTOR | Freq: Every day | SUBCUTANEOUS | Status: DC

## 2015-05-17 MED ORDER — HEPARIN (PORCINE) IN NACL 2-0.9 UNIT/ML-% IJ SOLN
INTRAMUSCULAR | Status: AC
  Filled 2015-05-17: qty 1000

## 2015-05-17 MED ORDER — ASPIRIN EC 81 MG PO TBEC
81.0000 mg | DELAYED_RELEASE_TABLET | Freq: Every day | ORAL | Status: DC
  Administered 2015-05-17 – 2015-05-18 (×2): 81 mg via ORAL
  Filled 2015-05-17 (×2): qty 1

## 2015-05-17 MED ORDER — IOHEXOL 350 MG/ML SOLN
INTRAVENOUS | Status: DC | PRN
  Administered 2015-05-17: 85 mL via INTRA_ARTERIAL

## 2015-05-17 MED ORDER — ATORVASTATIN CALCIUM 40 MG PO TABS
40.0000 mg | ORAL_TABLET | Freq: Every day | ORAL | Status: DC
Start: 2015-05-18 — End: 2015-05-18
  Administered 2015-05-18: 40 mg via ORAL
  Filled 2015-05-17: qty 1

## 2015-05-17 MED ORDER — SODIUM CHLORIDE 0.9 % IJ SOLN
3.0000 mL | INTRAMUSCULAR | Status: DC | PRN
  Administered 2015-05-17: 3 mL via INTRAVENOUS
  Filled 2015-05-17: qty 3

## 2015-05-17 MED ORDER — FENTANYL CITRATE (PF) 100 MCG/2ML IJ SOLN
INTRAMUSCULAR | Status: AC
  Filled 2015-05-17: qty 4

## 2015-05-17 MED ORDER — VERAPAMIL HCL 2.5 MG/ML IV SOLN
INTRAVENOUS | Status: AC
  Filled 2015-05-17: qty 2

## 2015-05-17 MED ORDER — LOSARTAN POTASSIUM 50 MG PO TABS
100.0000 mg | ORAL_TABLET | Freq: Every day | ORAL | Status: DC
  Administered 2015-05-18: 100 mg via ORAL
  Filled 2015-05-17: qty 2

## 2015-05-17 MED ORDER — MIDAZOLAM HCL 2 MG/2ML IJ SOLN
INTRAMUSCULAR | Status: AC
  Filled 2015-05-17: qty 4

## 2015-05-17 MED ORDER — VITAMIN C 100 MG PO TABS: 100.0000 mg | ORAL_TABLET | Freq: Every day | ORAL | Status: DC

## 2015-05-17 MED ORDER — CALCIUM-D 600-400 MG-UNIT PO TABS: 1.0000 | ORAL_TABLET | Freq: Every day | ORAL | Status: DC

## 2015-05-17 MED ORDER — ASPIRIN 81 MG PO CHEW
81.0000 mg | CHEWABLE_TABLET | ORAL | Status: AC
  Administered 2015-05-17: 81 mg via ORAL

## 2015-05-17 MED ORDER — ONDANSETRON HCL 4 MG/2ML IJ SOLN: 4.0000 mg | Freq: Four times a day (QID) | INTRAMUSCULAR | Status: DC | PRN

## 2015-05-17 MED ORDER — VITAMIN C 500 MG PO TABS
250.0000 mg | ORAL_TABLET | Freq: Every day | ORAL | Status: DC
  Administered 2015-05-18: 250 mg via ORAL
  Filled 2015-05-17: qty 1

## 2015-05-17 MED ORDER — ACETAMINOPHEN 325 MG PO TABS: 650.0000 mg | ORAL_TABLET | ORAL | Status: DC | PRN

## 2015-05-17 MED ORDER — MULTIVITAMINS PO CAPS: 1.0000 | ORAL_CAPSULE | Freq: Every day | ORAL | Status: DC

## 2015-05-17 MED ORDER — FENTANYL CITRATE (PF) 100 MCG/2ML IJ SOLN
INTRAMUSCULAR | Status: DC | PRN
  Administered 2015-05-17: 50 ug via INTRAVENOUS

## 2015-05-17 MED ORDER — SODIUM CHLORIDE 0.9 % WEIGHT BASED INFUSION: 1.0000 mL/kg/h | INTRAVENOUS | Status: DC

## 2015-05-17 MED ORDER — HEPARIN (PORCINE) IN NACL 2-0.9 UNIT/ML-% IJ SOLN
INTRAMUSCULAR | Status: DC | PRN
  Administered 2015-05-17: 10:00:00

## 2015-05-17 MED ORDER — MIDAZOLAM HCL 2 MG/2ML IJ SOLN
INTRAMUSCULAR | Status: DC | PRN
  Administered 2015-05-17: 1 mg via INTRAVENOUS

## 2015-05-17 MED ORDER — LIDOCAINE HCL (PF) 1 % IJ SOLN
INTRAMUSCULAR | Status: AC
  Filled 2015-05-17: qty 30

## 2015-05-17 MED ORDER — SODIUM CHLORIDE 0.9 % WEIGHT BASED INFUSION
3.0000 mL/kg/h | INTRAVENOUS | Status: DC
  Administered 2015-05-17: 3 mL/kg/h via INTRAVENOUS

## 2015-05-17 MED ORDER — SODIUM CHLORIDE 0.9 % IJ SOLN: 3.0000 mL | INTRAMUSCULAR | Status: DC | PRN

## 2015-05-17 MED ORDER — ASPIRIN 81 MG PO TABS: 81.0000 mg | ORAL_TABLET | Freq: Every day | ORAL | Status: DC

## 2015-05-17 MED ORDER — CARVEDILOL 3.125 MG PO TABS
3.1250 mg | ORAL_TABLET | Freq: Two times a day (BID) | ORAL | Status: DC
  Administered 2015-05-18: 3.125 mg via ORAL
  Filled 2015-05-17 (×2): qty 1

## 2015-05-17 MED ORDER — CALCIUM CARBONATE-VITAMIN D 500-200 MG-UNIT PO TABS
1.0000 | ORAL_TABLET | Freq: Every day | ORAL | Status: DC
  Administered 2015-05-18: 1 via ORAL
  Filled 2015-05-17: qty 1

## 2015-05-17 MED ORDER — HEPARIN SODIUM (PORCINE) 1000 UNIT/ML IJ SOLN
INTRAMUSCULAR | Status: AC
  Filled 2015-05-17: qty 1

## 2015-05-17 MED ORDER — FERROUS SULFATE 325 (65 FE) MG PO TABS
325.0000 mg | ORAL_TABLET | Freq: Two times a day (BID) | ORAL | Status: DC
  Administered 2015-05-18: 325 mg via ORAL
  Filled 2015-05-17 (×2): qty 1

## 2015-05-17 MED ORDER — VITAMIN D 1000 UNITS PO TABS
5000.0000 [IU] | ORAL_TABLET | Freq: Every day | ORAL | Status: DC
  Administered 2015-05-18: 5000 [IU] via ORAL
  Filled 2015-05-17: qty 5

## 2015-05-17 MED ORDER — SODIUM CHLORIDE 0.9 % IJ SOLN
3.0000 mL | Freq: Two times a day (BID) | INTRAMUSCULAR | Status: DC
  Administered 2015-05-17 – 2015-05-18 (×3): 3 mL via INTRAVENOUS

## 2015-05-17 MED ORDER — SODIUM CHLORIDE 0.9 % IJ SOLN
3.0000 mL | Freq: Two times a day (BID) | INTRAMUSCULAR | Status: DC
  Administered 2015-05-17: 3 mL via INTRAVENOUS

## 2015-05-17 MED ORDER — GLIMEPIRIDE 2 MG PO TABS
2.0000 mg | ORAL_TABLET | Freq: Every day | ORAL | Status: DC
  Administered 2015-05-18: 2 mg via ORAL
  Filled 2015-05-17 (×3): qty 1

## 2015-05-17 MED ORDER — VERAPAMIL HCL 2.5 MG/ML IV SOLN
INTRAVENOUS | Status: DC | PRN
  Administered 2015-05-17: 10:00:00 via INTRA_ARTERIAL

## 2015-05-17 SURGICAL SUPPLY — 9 items
CATH INFINITI 5 FR JL3.5 (CATHETERS) ×2 IMPLANT
CATH INFINITI JR4 5F (CATHETERS) ×2 IMPLANT
DEVICE RAD COMP TR BAND LRG (VASCULAR PRODUCTS) ×2 IMPLANT
GLIDESHEATH SLEND A-KIT 6F 22G (SHEATH) ×2 IMPLANT
KIT HEART LEFT (KITS) ×2 IMPLANT
PACK CARDIAC CATHETERIZATION (CUSTOM PROCEDURE TRAY) ×2 IMPLANT
TRANSDUCER W/STOPCOCK (MISCELLANEOUS) ×2 IMPLANT
TUBING CIL FLEX 10 FLL-RA (TUBING) ×2 IMPLANT
WIRE SAFE-T 1.5MM-J .035X260CM (WIRE) ×2 IMPLANT

## 2015-05-17 NOTE — H&P (View-Only) (Signed)
Cardiology Office Note   Date:  04/30/2015   ID:  Bradley [name], DOB 05/09/1946, MRN [id number]  PCP:   Melinda Crutch, MD  Cardiologist:  Sinclair Grooms, MD   Chief Complaint  Patient presents with  . Coronary Artery Disease      History of Present Illness: Bradley [name] is a 69 y.o. male who presents for abdominal aortic aneurysm and peripheral arterial disease with claudication, carotid artery obstructive disease, prior CVA, hypertension, type 2 diabetes, hyperlipidemia, obstructive sleep apnea, and chronic right bundle branch block.  The patient is referred back for cardiac evaluation because of progressive dyspnea on exertion over the past 6-12 months. He walks on a treadmill on a regular basis. Walking inclines upstairs causes severe shortness of breath. He has difficulty with dyspnea tried up on his close. He is a prior smoker. He denies orthopnea PND. Is no peripheral edema.    Past Medical History  Diagnosis Date  . Hyperlipidemia   . Hypertension   . Diabetes mellitus age 66  . Peripheral vascular disease   . Arthritis   . Reflux esophagitis   . Stroke 1987    Right brain stroke  . Neuropathy 2013  . Osteoporosis 2013  . Sleep apnea     uses CPAP  . GERD (gastroesophageal reflux disease)   . H/O hiatal hernia   . Irritable bowel syndrome (IBS)   . Myocardial infarction     silent inferior MI; patient denies MI history (03/17/13)   . Anemia   . Carotid artery occlusion     Past Surgical History  Procedure Laterality Date  . Carotid endarterectomy  1994 & redo 2001    Left  . Pr vein bypass graft,aorto-fem-pop  1998  . Iliac artery stent    . Fracture surgery  2013    Right   foo  t X's 2  . Tonsillectomy    . Posterior lumbar fusion  Aug. 14, 2014    Level 1  . Spine surgery  2014     Current Outpatient Prescriptions  Medication Sig Dispense Refill  . Ascorbic Acid (VITAMIN C) 100 MG tablet Take 100 mg by mouth daily.    Marland Kitchen aspirin 81 MG  tablet Take 81 mg by mouth daily.      Marland Kitchen atorvastatin (LIPITOR) 40 MG tablet Take 40 mg by mouth daily.      . Calcium Carbonate-Vitamin D (CALCIUM-D) 600-400 MG-UNIT TABS Take by mouth.    . carvedilol (COREG) 3.125 MG tablet Take 3.125 mg by mouth 2 (two) times daily with a meal.    . Cholecalciferol (D-3-5) 5000 UNITS capsule Take 5,000 Units by mouth daily.    . clopidogrel (PLAVIX) 75 MG tablet Take 75 mg by mouth daily.      Marland Kitchen dexlansoprazole (DEXILANT) 60 MG capsule Take 60 mg by mouth daily.    Marland Kitchen glimepiride (AMARYL) 2 MG tablet Take 2 mg by mouth daily before breakfast.      . IRON, FERROUS GLUCONATE, PO Take 40 mg by mouth 2 (two) times daily.    . Liraglutide (VICTOZA [place]) Inject 1.8 mLs into the skin daily.     Marland Kitchen losartan (COZAAR) 100 MG tablet Take 100 mg by mouth Daily.    . metFORMIN (GLUCOPHAGE-XR) 500 MG 24 hr tablet Take 1,000 mg by mouth 2 (two) times daily.     . Multiple Vitamin (MULTIVITAMIN) capsule Take 1 capsule by mouth daily.      Marland Kitchen  vitamin E 400 UNIT capsule Take 400 Units by mouth daily.       No current facility-administered medications for this visit.    Allergies:   Dilaudid cough; Food; Hydrocodone; Oxycodone; Ultram; Penicillins; and Sulfa drugs cross reactors    Social History:  The patient  reports that he quit smoking about 6 years ago. His smoking use included Cigarettes. He has a 40 pack-year smoking history. He has never used smokeless tobacco. He reports that he does not drink alcohol or use illicit drugs.   Family History:  The patient's family history includes Cancer (age of onset: 78) in his mother; Deep vein thrombosis in his father; Hyperlipidemia in his father and mother; Hypertension in his father; Stroke (age of onset: 89) in his father.    ROS:  Please see the history of present illness.   Otherwise, review of systems are positive for weight gain, recent chills, excessive fatigue, sweating, nausea, vomiting, wheezing, back pain, left flank  pain, and difficulty with balance..   All other systems are reviewed and negative.    PHYSICAL EXAM: VS:  BP 130/80 mmHg  Pulse 88  Ht 5' 11.5" (1.816 m)  Wt 96.344 kg (212 lb 6.4 oz)  BMI 29.21 kg/m2 , BMI Body mass index is 29.21 kg/(m^2). GEN: Well nourished, well developed, in no acute distress. Morbidly obese. HEENT: normal Neck: no JVD, carotid bruits, or masses Cardiac: RRR.  There is no murmur, rub, or gallop. There is no edema. Unable to palpate popliteal and pedal pulses bilaterally. Respiratory:  clear to auscultation bilaterally, normal work of breathing. GI: soft, nontender, nondistended, + BS MS: no deformity or atrophy Skin: warm and dry, no rash Neuro:  Strength and sensation are intact Psych: euthymic mood, full affect   EKG:  EKG is ordered today. The ekg reveals normal sinus rhythm with right bundle branch block. No change from prior EKG 2013 said by Dr. Melinda Crutch.   Recent Labs: No results found for requested labs within last 365 days.    Lipid Panel No results found for: CHOL, TRIG, HDL, CHOLHDL, VLDL, LDLCALC, LDLDIRECT    Wt Readings from Last 3 Encounters:  04/30/15 96.344 kg (212 lb 6.4 oz)  01/16/15 99.791 kg (220 lb)  07/11/14 100.245 kg (221 lb)      Other studies Reviewed: Additional studies/ records that were reviewed today include: Prior abdominal aortic aneurysm repair, left carotid endarterectomy 2, and prior cardiac evaluation including nuclear stress test at [facility] nonischemic.Marland Kitchen The findings include chronic left bundle branch block.    ASSESSMENT AND PLAN:  1. Peripheral vascular disease Claudication  2. Essential hypertension Controlled  3. Hyperlipidemia On therapy  4. DM type 2, controlled, with complication Managed by primary  5. Dyspnea This could be related to myocardial ischemia, physical deconditioning, pulmonary disease, or other conditions. Given widespread vascular obstructive disease, we need to rule out  ischemic heart disease.  6. Prior history of CVA.  Current medicines are reviewed at length with the patient today.  The patient has the following concerns regarding medicines: None.  The following changes/actions have been instituted:    Stress myocardial perfusion study with permission to convert to [place] if needed  2-D Doppler echocardiogram  BNP  Labs/ tests ordered today include:   Orders Placed This Encounter  Procedures  . B Nat Peptide  . Myocardial Perfusion Imaging  . EKG 12-Lead  . Echocardiogram     Disposition:   FU with HS in when necessary depending  upon workup   Signed, Sinclair Grooms, MD  04/30/2015 9:33 AM    [address], [place], [place]  [address] Phone: [phone]; Fax: [phone]

## 2015-05-17 NOTE — Plan of Care (Signed)
Problem: Phase I Progression Outcomes Goal: Vascular site scale level 0 - I Vascular Site Scale Level 0: No bruising/bleeding/hematoma Level I (Mild): Bruising/Ecchymosis, minimal bleeding/ooozing, palpable hematoma < 3 cm Level II (Moderate): Bleeding not affecting hemodynamic parameters, pseudoaneurysm, palpable hematoma > 3 cm Level III (Severe) Bleeding which affects hemodynamic parameters or retroperitoneal hemorrhage  Outcome: Completed/Met Date Met:  05/17/15 Level 0

## 2015-05-17 NOTE — Interval H&P Note (Signed)
Cath Lab Visit (complete for each Cath Lab visit)  Clinical Evaluation Leading to the Procedure:   ACS: No.  Non-ACS:    Anginal Classification: CCS III  Anti-ischemic medical therapy: Maximal Therapy (2 or more classes of medications)  Non-Invasive Test Results: Intermediate-risk stress test findings: cardiac mortality 1-3%/year  Prior CABG: No previous CABG      History and Physical Interval Note:  05/17/2015 9:29 AM  Bradley [name]  has presented today for surgery, with the diagnosis of Chest Pain  The various methods of treatment have been discussed with the patient and family. After consideration of risks, benefits and other options for treatment, the patient has consented to  Procedure(s): Left Heart Cath and Coronary Angiography (N/A) as a surgical intervention .  The patient's history has been reviewed, patient examined, no change in status, stable for surgery.  I have reviewed the patient's chart and labs.  Questions were answered to the patient's satisfaction.     Sinclair Grooms

## 2015-05-17 NOTE — Consult Note (Signed)
[address]       ,[place] [address]             [phone]        Telvin T Romo Massena Medical Record [id number] Date of Birth: 17-Jan-1946  Referring: Dr Tamala Julian Primary Care:  Melinda Crutch, MD  Chief Complaint:   Abnormal stress test  History of Present Illness:    The patient is a 69 year old male with multiple medical comorbidities including history of CVA, hypertension, type 2 diabetes mellitus, hyperlipidemia, obstructive sleep apnea, right bundle branch block. He also has a history of a extensive peripheral arterial disease with claudication as well as extracranial cerebrovascular occlusive disease s/p L CEA in 1994 with redo in 2001. He has a history of an aortoiliac occlusive disease s/p aorto Bifem bypass in 1998. He was recently referred to cardiology for evaluation of coronary artery disease. A nuclear myocardial perfusion study was positive without evidence of nonreversible ischemia. He primarily has symptoms of shortness of breath and not angina. These lesions are felt to be complex for PCI and therefore high risk. We are asked to evaluate for surgical opinion for coronary artery revascularization.`    Current Activity/ Functional Status: Patient is independent with mobility/ambulation, transfers, ADL's, IADL's.   Zubrod Score: At the time of surgery this patient's most appropriate activity status/level should be described as: []     0    Normal activity, no symptoms [x]     1    Restricted in physical strenuous activity but ambulatory, able to do out light work []     2    Ambulatory and capable of self care, unable to do work activities, up and about                 more than 50%  Of the time                            []     3    Only limited self care, in bed greater than 50% of waking hours []     4    Completely disabled, no self care, confined to bed or chair []     5    Moribund  Past Medical History  Diagnosis Date  . Hyperlipidemia   .  Hypertension   . Diabetes mellitus age 20  . Peripheral vascular disease ([place])   . Arthritis   . Reflux esophagitis   . Stroke [facility]) 1987    Right brain stroke  . Neuropathy ([place]) 2013  . Osteoporosis 2013  . Sleep apnea     uses CPAP  . GERD (gastroesophageal reflux disease)   . H/O hiatal hernia   . Irritable bowel syndrome (IBS)   . Myocardial infarction [facility])     silent inferior MI; patient denies MI history (03/17/13)   . Anemia   . Carotid artery occlusion   . Coronary artery disease   . Shortness of breath dyspnea   Extensive diverticulosis Right ICA stenosis 50-69% per duplex   Past Surgical History  Procedure Laterality Date  . Carotid endarterectomy  1994 & redo 2001    Left  . Pr vein bypass graft,aorto-fem-pop  1998  . Iliac artery stent    . Fracture surgery  2013    Right   foo  t X's 2  . Tonsillectomy    . Posterior lumbar fusion  Aug. 14, 2014  Level 1  . Spine surgery  2014    History  Smoking status  . Former Smoker -- 1.00 packs/day for 40 years  . Types: Cigarettes  . Quit date: 03/26/2009  Smokeless tobacco  . Never Used    History  Alcohol Use No    Social History   Social History  . Marital Status: Married    Spouse Name: N/A  . Number of Children: N/A  . Years of Education: N/A   Occupational History  . Not on file.   Social History Main Topics  . Smoking status: Former Smoker -- 1.00 packs/day for 40 years    Types: Cigarettes    Quit date: 03/26/2009  . Smokeless tobacco: Never Used  . Alcohol Use: No  . Drug Use: No  . Sexual Activity: Not on file   Other Topics Concern  . Not on file   Social History Narrative    Allergies  Allergen Reactions  . Dilaudid Cough Nausea Only  . Food     "garlic, tomatoes, acid foods"= mouth blisters  . Hydrocodone Nausea Only  . Oxycodone Nausea Only  . Ultram [Tramadol Hcl] Nausea Only  . Penicillins Rash  . Sulfa Drugs Cross Reactors Rash    Current  Facility-Administered Medications  Medication Dose Route Frequency Provider Last Rate Last Dose  . 0.9 %  sodium chloride infusion  250 mL Intravenous PRN Belva Crome, MD      . 0.9% sodium chloride infusion  3 mL/kg/hr Intravenous Continuous Belva Crome, MD      . acetaminophen (TYLENOL) tablet 650 mg  650 mg Oral Q4H PRN Belva Crome, MD      . aspirin 81 MG chewable tablet           . [START ON 05/18/2015] enoxaparin (LOVENOX) injection 40 mg  40 mg Subcutaneous Q24H Belva Crome, MD      . ondansetron [facility]) injection 4 mg  4 mg Intravenous Q6H PRN Belva Crome, MD      . sodium chloride 0.9 % injection 3 mL  3 mL Intravenous Q12H Belva Crome, MD      . sodium chloride 0.9 % injection 3 mL  3 mL Intravenous PRN Belva Crome, MD        Prescriptions prior to admission  Medication Sig Dispense Refill Last Dose  . Ascorbic Acid (VITAMIN C) 100 MG tablet Take 100 mg by mouth daily.   05/16/2015 at Unknown time  . aspirin 81 MG tablet Take 81 mg by mouth daily.     05/16/2015 at Unknown time  . atorvastatin (LIPITOR) 40 MG tablet Take 40 mg by mouth daily.     05/17/2015 at 0600  . Calcium Carbonate-Vitamin D (CALCIUM-D) 600-400 MG-UNIT TABS Take 1 tablet by mouth daily.    05/16/2015 at Unknown time  . carvedilol (COREG) 3.125 MG tablet Take 3.125 mg by mouth 2 (two) times daily with a meal.   05/16/2015 at Unknown time  . Cholecalciferol (D-3-5) 5000 UNITS capsule Take 5,000 Units by mouth daily.   05/16/2015 at Unknown time  . clopidogrel (PLAVIX) 75 MG tablet Take 75 mg by mouth daily.     05/17/2015 at 0600  . dexlansoprazole (DEXILANT) 60 MG capsule Take 60 mg by mouth daily.   05/16/2015 at Unknown time  . glimepiride (AMARYL) 2 MG tablet Take 2 mg by mouth daily before breakfast.     05/15/2015 at Unknown time  . IRON, FERROUS  GLUCONATE, PO Take 40 mg by mouth 2 (two) times daily.   05/16/2015 at Unknown time  . Liraglutide (VICTOZA [place]) Inject 1.8 mg into the skin daily.    05/15/2015   . losartan (COZAAR) 100 MG tablet Take 100 mg by mouth Daily.   05/16/2015 at Unknown time  . metFORMIN (GLUCOPHAGE-XR) 500 MG 24 hr tablet Take 1,000 mg by mouth 2 (two) times daily.    05/15/2015  . Multiple Vitamin (MULTIVITAMIN) capsule Take 1 capsule by mouth daily.     05/16/2015 at Unknown time  . vitamin E 400 UNIT capsule Take 400 Units by mouth daily.     05/16/2015 at Unknown time    Family History  Problem Relation Age of Onset  . Cancer Mother 77    pancreatic  . Hyperlipidemia Mother   . Stroke Father 67  . Deep vein thrombosis Father   . Hyperlipidemia Father   . Hypertension Father      Review of Systems:  Pertinent items are noted in HPI.     Cardiac Review of Systems: Y or N  Chest Pain [ n   ]  Resting SOB [  n ] Exertional SOB  [ + ]  Orthopnea [ n ]   Pedal Edema [ n]    Palpitations [  n] Syncope  [n  ]   Presyncope [ n  ]  General Review of Systems: [Y] = yes [  ]=no Constitional: recent weight change [  ]; anorexia [  ]; fatigue [ + ]; nausea [  +]; night sweats [  ]; fever [  ]; or chills [  ]                                                               Dental: poor dentition[ n ]; Last Dentist visit:   Eye : blurred vision [  ]; diplopia [   ]; vision changes [  ];  Amaurosis fugax[  ]; Resp: cough [  ];  wheezing[  ];  hemoptysis[  ]; shortness of breath[ +  ]; paroxysmal nocturnal dyspnea[  ]; dyspnea on exertion[  ]; or orthopnea[  ];  GI:  gallstones[  ], vomiting[  ];  dysphagia[  ]; melena[  ];  hematochezia [  ]; heartburn[  ];   Hx of  Colonoscopy[  ]; GU: kidney stones [  ]; hematuria[  ];   dysuria [  ];  nocturia[  ];  history of     obstruction [  ]; urinary frequency [  ]             Skin: rash, swelling[  ];, hair loss[  ];  peripheral edema[  ];  or itching[  ]; Musculosketetal: myalgias[  ];  joint swelling[  ];  joint erythema[  ];  joint pain[  ];  back pain[  +];  Heme/Lymph: bruising[  ];  bleeding[  ];  anemia[  ];  Neuro: TIA[ y ];   headaches[  ];  stroke[y  ];  vertigo[  ];  seizures[ n ];   paresthesias[  ];  difficulty walking[  ];some difficulty with balance at times  Psych:depression[  ]; anxiety[  ];  Endocrine: diabetes[30 years   ];  thyroid dysfunction[  ];  Immunizations: Flu [  ]; Pneumococcal[  ];  Other:  Physical Exam: BP 158/85 mmHg  Pulse 92  Temp(Src) 97.3 F (36.3 C) (Oral)  Resp 13  Ht 6' (1.829 m)  Wt 208 lb (94.348 kg)  BMI 28.20 kg/m2  SpO2 99%   General appearance: alert, cooperative and no distress Head: Normocephalic, without obvious abnormality, atraumatic Neck: no adenopathy, no carotid bruit, no JVD, supple, symmetrical, trachea midline and thyroid not enlarged, symmetric, no tenderness/mass/nodules Lymph nodes: Cervical, supraclavicular, and axillary nodes normal. Resp: clear to auscultation bilaterally Back: symmetric, no curvature. ROM normal. No CVA tenderness. Cardio: regular rate and rhythm, S1, S2 normal, no murmur, click, rub or gallop GI: soft, non-tender; bowel sounds normal; no masses,  no organomegaly Extremities: extremities normal, atraumatic, no cyanosis or edema Neurologic: Grossly normal periph pulses: absent DP/PT bilaterial  Diagnostic Studies & Laboratory data:     Recent Radiology Findings:   No results found.   I have independently reviewed the above radiologic studies.  Recent Lab Findings: Lab Results  Component Value Date   WBC 7.7 05/11/2015   HGB 13.4 05/11/2015   HCT 40.0 05/11/2015   PLT 285.0 05/11/2015   GLUCOSE 152* 05/11/2015   ALT 23 12/12/2013   AST 19 12/12/2013   NA 137 05/11/2015   K 4.3 05/11/2015   CL 102 05/11/2015   CREATININE 1.45 05/11/2015   BUN 18 05/11/2015   CO2 27 05/11/2015   INR 1.0 05/11/2015     Procedures    Left Heart Cath and Coronary Angiography    Conclusion     Mid LAD lesion, 90% stenosed.  Ost 2nd Diag lesion, 80% stenosed.  Mid RCA lesion, 75% stenosed.  Prox RCA lesion, 35%  stenosed.   Complex mid LAD, calcified 90+ percent stenosis prior to a trifurcation. The larger diagonal branch also has 80% stenosis.  75% mid RCA. RCA is a dominant vessel. The proximal RCA contains an eccentric region of calcification or dissection.  Widely patent circumflex  Overall normal LV function  The patient's clinical presentation is that of exertional dyspnea and not angina. In this prior smoker concerned that dyspnea may be more pulmonary than cardiac needs to be considered    Recommendations:   Evaluation by TCTS to consider whether CABG versus PCI would be best treatment option. PCI would be higher risk for ischemic complications and both territories.  Evaluation of dyspnea and will probably need a pulmonary consult.  Will hold Plavix for the time being.  Probable discharge in a.m. after decision making, reevaluation of kidney function, and further discussion with patient and family.                [facility]            [address], [place] [address]              [phone]  ------------------------------------------------------------------- Transthoracic Echocardiography  Patient:  Cardier, Koob MR #:    [id number] Study Date: 05/08/2015 Gender:   M Age:    74 Height:   180.3 cm Weight:   96.2 kg BSA:    2.22 m^2 Pt. Status: Room:  Ivar Bury, MD REFERRING  Belva Crome, MD ATTENDING  Fransico Him, MD SONOGRAPHER Wyatt Mage, RDCS PERFORMING  [facility], Outpatient  cc:  ------------------------------------------------------------------- LV EF: 55% -  60%  ------------------------------------------------------------------- Indications:   Dyspnea (  R06.00).  ------------------------------------------------------------------- History:  Risk factors: Carotid artery stenosis. PVD. RBBB. Hypertension.  Diabetes mellitus. Dyslipidemia.  ------------------------------------------------------------------- Study Conclusions  - Left ventricle: Global LV longitudinal strain was -15.2% The cavity size was normal. Systolic function was normal. The estimated ejection fraction was in the range of 55% to 60%. Wall motion was normal; there were no regional wall motion abnormalities. There was an increased relative contribution of atrial contraction to ventricular filling. Doppler parameters are consistent with abnormal left ventricular relaxation (grade 1 diastolic dysfunction). - Aortic valve: Moderate thickening and calcification, consistent with sclerosis. - Aorta: Aortic root dimension: 39 mm (ED). - Aortic root: The aortic root was mildly dilated. - Atrial septum: There was increased thickness of the septum, consistent with lipomatous hypertrophy.  Transthoracic echocardiography. M-mode, complete 2D, spectral Doppler, and color Doppler. Birthdate: Patient birthdate: 07-14-1946. Age: Patient is 69 yr old. Sex: Gender: male. BMI: 29.6 kg/m^2. Blood pressure:   130/80 Patient status: Outpatient. Study date: Study date: 05/08/2015. Study time: 08:27 AM. Location: [facility]  -------------------------------------------------------------------  ------------------------------------------------------------------- Left ventricle: Global LV longitudinal strain was -15.2% The cavity size was normal. Systolic function was normal. The estimated ejection fraction was in the range of 55% to 60%. Wall motion was normal; there were no regional wall motion abnormalities. There was an increased relative contribution of atrial contraction to ventricular filling. Doppler parameters are consistent with abnormal left ventricular relaxation (grade 1 diastolic dysfunction).  ------------------------------------------------------------------- Aortic valve:   Trileaflet. Moderate thickening and calcification, consistent with sclerosis. Mobility was not restricted. Doppler: Transvalvular velocity was within the normal range. There was no stenosis. There was no regurgitation.  VTI ratio of LVOT to aortic valve: 0.72. Indexed valve area (VTI): 1.04 cm^2/m^2. Mean velocity ratio of LVOT to aortic valve: 0.65. Indexed valve area (Vmean): 0.94 cm^2/m^2.  Mean gradient (S): 4 mm Hg.  ------------------------------------------------------------------- Aorta: Aortic root: The aortic root was mildly dilated.  ------------------------------------------------------------------- Mitral valve:  Structurally normal valve.  Mobility was not restricted. Doppler: Transvalvular velocity was within the normal range. There was no evidence for stenosis. There was no regurgitation.  Peak gradient (D): 2 mm Hg.  ------------------------------------------------------------------- Left atrium: The atrium was normal in size.  ------------------------------------------------------------------- Atrial septum: There was increased thickness of the septum, consistent with lipomatous hypertrophy.  ------------------------------------------------------------------- Right ventricle: The cavity size was normal. Wall thickness was normal. Systolic function was normal.  ------------------------------------------------------------------- Pulmonic valve:  Structurally normal valve.  Cusp separation was normal. Doppler: Transvalvular velocity was within the normal range. There was no evidence for stenosis. There was no regurgitation.  ------------------------------------------------------------------- Tricuspid valve:  Structurally normal valve.  Doppler: Transvalvular velocity was within the normal range. There was no regurgitation.  ------------------------------------------------------------------- Pulmonary artery:  The main pulmonary artery  was normal-sized. Systolic pressure was within the normal range.  ------------------------------------------------------------------- Right atrium: The atrium was normal in size.  ------------------------------------------------------------------- Pericardium: There was no pericardial effusion.  ------------------------------------------------------------------- Systemic veins: Inferior vena cava: The vessel was mildly dilated.  ------------------------------------------------------------------- Measurements  Left ventricle              Value     Reference LV ID, ED, PLAX chordal          43  mm    43 - 52 LV ID, ES, PLAX chordal          33  mm    23 - 38 LV fx shortening, PLAX chordal   (L)  23  %    >=29 LV PW thickness,  ED            10  mm    --------- IVS/LV PW ratio, ED            1       <=1.3 Stroke volume, 2D             66  ml    --------- Stroke volume/bsa, 2D           30  ml/m^2  --------- LV e&', lateral              8.27 cm/s   --------- LV E/e&', lateral             9.03      --------- LV e&', medial               6.31 cm/s   --------- LV E/e&', medial              11.84     --------- LV e&', average              7.29 cm/s   --------- LV E/e&', average             10.25     --------- Longitudinal strain, TDI         15  %    ---------  Ventricular septum            Value     Reference IVS thickness, ED             10  mm    ---------  LVOT                   Value     Reference LVOT ID, S                20  mm    --------- LVOT area                 3.14 cm^2   --------- LVOT mean velocity, S            58.9 cm/s   --------- LVOT VTI, S                20.9 cm    ---------  Aortic valve               Value     Reference Aortic valve mean velocity, S       91.2 cm/s   --------- Aortic valve VTI, S            29.2 cm    --------- Aortic mean gradient, S          4   mm Hg  --------- VTI ratio, LVOT/AV            0.72      --------- Aortic valve area/bsa, VTI        1.04 cm^2/m^2 --------- Velocity ratio, mean, LVOT/AV       0.65      --------- Aortic valve area/bsa, mean        0.94 cm^2/m^2 --------- velocity  Aorta                   Value     Reference Aortic root ID, ED            39  mm    --------- Ascending aorta ID, A-P, S        31  mm    ---------  Left atrium                Value     Reference LA ID, A-P, ES              35  mm    --------- LA ID/bsa, A-P              1.58 cm/m^2  <=2.2 LA volume, S               50  ml    --------- LA volume/bsa, S             22.6 ml/m^2  --------- LA volume, ES, 1-p A4C          49.1 ml    --------- LA volume/bsa, ES, 1-p A4C        22.1 ml/m^2  --------- LA volume, ES, 1-p A2C          46.9 ml    --------- LA volume/bsa, ES, 1-p A2C        21.2 ml/m^2  ---------  Mitral valve               Value     Reference Mitral E-wave peak velocity        74.7 cm/s   --------- Mitral deceleration time         169  ms    150 - 230 Mitral peak gradient, D          2   mm Hg  --------- Mitral E/A ratio, peak          0.6      ---------  Systemic veins              Value     Reference Estimated CVP                8   mm Hg  ---------  Right ventricle              Value     Reference RV s&', lateral, S             11  cm/s   ---------  Legend: (L) and (H) mark values outside specified reference range.  ------------------------------------------------------------------- Prepared and Electronically Authenticated by  Fransico Him, MD 2016-09-27T09:14:02  Study Highlights nuclear medicine:    Nuclear stress EF: 52%.  Upsloping ST segment depression ST segment depression was noted during stress in the V4, V5 and V3 leads, beginning at 2 minutes of stress, ending at 6 minutes of stress, and returning to baseline after 1-5 minutes of recovery.  No T wave inversion was noted during stress.  Hypertensive BP response to exercise  Defect 1: There is a medium defect of moderate severity present in the basal inferior, mid inferior and apical inferior location. Consistent with diaphragmatic attenuation.  The study is abnormal, but there is no evidence of reversible ischemia.  This is an intermediate risk study. Intermediate risk is based on Duke Treadmill Score of 1, which evidences poor exercise tolerance and the fact that he had symptoms during the stress test.  The left ventricular ejection fraction is mildly decreased (45-54%).         CT scan 05/09/15 IMPRESSION: 1. No acute findings in the abdomen or pelvis to account for the patient's symptoms. 2. Extensive colonic diverticulosis, without evidence to suggest acute diverticulitis at this time. 3. Extensive atherosclerosis status post aortobifemoral bypass graft placement, with multiple vascular findings (most notable for complete  occlusion or high-grade stenosis of the left superficial femoral artery, and high-grade stenosis of the proximal right superficial femoral artery), as detailed above. 4. Normal appendix. 5. Additional incidental findings, as above.  Chronic Kidney  Disease   Stage I     GFR >90  Stage II    GFR 60-89  Stage IIIA GFR 45-59  Stage IIIB GFR 30-44  Stage IV   GFR 15-29  Stage V    GFR  <15  Lab Results  Component Value Date   CREATININE 1.48* 05/17/2015   Estimated Creatinine Clearance: 54.6 mL/min (by C-G formula based on Cr of 1.48).    Assessment / Plan:   Severe complex 2 vessel disease. Will need plavix washout and probable pulmonology clearance prior to CABG Mildly  dilated aortic root Agree with cabg as best treatment with complex lad disease. Patient is at increased risk of cabg due to dm, cerebral vascular and peripheral vascular disease Poss CABG Wednesday next week      ckd stage IIIA Hyperlipidemia  Hypertension  Diabetes mellitus  Peripheral vascular disease ([name])  Arthritis  Reflux esophagitis  Stroke ([place]) in past left face left arm and leg   Neuropathy ([name])  Osteoporosis  Sleep apnea  GERD (gastroesophageal reflux disease)  H/O hiatal hernia  Irritable bowel syndrome (IBS)  Myocardial infarction ([place])  Anemia  Carotid artery occlusion  Coronary artery disease  Shortness of breath dyspnea  Extensive diviticulitis   Grace Isaac MD      [address] [place] [address] Office [phone]   [place]

## 2015-05-18 ENCOUNTER — Encounter (HOSPITAL_COMMUNITY): Payer: Self-pay | Admitting: Physician Assistant

## 2015-05-18 ENCOUNTER — Ambulatory Visit (HOSPITAL_BASED_OUTPATIENT_CLINIC_OR_DEPARTMENT_OTHER): Payer: PPO

## 2015-05-18 ENCOUNTER — Ambulatory Visit (HOSPITAL_COMMUNITY): Payer: PPO

## 2015-05-18 DIAGNOSIS — E1122 Type 2 diabetes mellitus with diabetic chronic kidney disease: Secondary | ICD-10-CM | POA: Diagnosis not present

## 2015-05-18 DIAGNOSIS — E785 Hyperlipidemia, unspecified: Secondary | ICD-10-CM

## 2015-05-18 DIAGNOSIS — I10 Essential (primary) hypertension: Secondary | ICD-10-CM | POA: Diagnosis not present

## 2015-05-18 DIAGNOSIS — I2584 Coronary atherosclerosis due to calcified coronary lesion: Secondary | ICD-10-CM | POA: Diagnosis not present

## 2015-05-18 DIAGNOSIS — I739 Peripheral vascular disease, unspecified: Secondary | ICD-10-CM

## 2015-05-18 DIAGNOSIS — I25118 Atherosclerotic heart disease of native coronary artery with other forms of angina pectoris: Secondary | ICD-10-CM

## 2015-05-18 DIAGNOSIS — I129 Hypertensive chronic kidney disease with stage 1 through stage 4 chronic kidney disease, or unspecified chronic kidney disease: Secondary | ICD-10-CM | POA: Diagnosis not present

## 2015-05-18 DIAGNOSIS — I2510 Atherosclerotic heart disease of native coronary artery without angina pectoris: Secondary | ICD-10-CM | POA: Diagnosis not present

## 2015-05-18 DIAGNOSIS — Z01810 Encounter for preprocedural cardiovascular examination: Secondary | ICD-10-CM

## 2015-05-18 DIAGNOSIS — R9439 Abnormal result of other cardiovascular function study: Secondary | ICD-10-CM

## 2015-05-18 LAB — PULMONARY FUNCTION TEST
DL/VA % pred: 91 %
DL/VA: 4.28 ml/min/mmHg/L
DLCO cor % pred: 70 %
DLCO cor: 24.1 ml/min/mmHg
DLCO unc % pred: 64 %
DLCO unc: 22.12 ml/min/mmHg
FEF 25-75 Post: 2.2 L/sec
FEF 25-75 Pre: 1.82 L/sec
FEF2575-%Change-Post: 20 %
FEF2575-%Pred-Post: 86 %
FEF2575-%Pred-Pre: 71 %
FEV1-%Change-Post: 8 %
FEV1-%Pred-Post: 74 %
FEV1-%Pred-Pre: 69 %
FEV1-Post: 2.54 L
FEV1-Pre: 2.35 L
FEV1FVC-%Change-Post: 5 %
FEV1FVC-%Pred-Pre: 99 %
FEV6-%Change-Post: 2 %
FEV6-%Pred-Post: 75 %
FEV6-%Pred-Pre: 73 %
FEV6-Post: 3.31 L
FEV6-Pre: 3.23 L
FEV6FVC-%Pred-Post: 106 %
FEV6FVC-%Pred-Pre: 106 %
FVC-%Change-Post: 2 %
FVC-%Pred-Post: 71 %
FVC-%Pred-Pre: 69 %
FVC-Post: 3.31 L
FVC-Pre: 3.23 L
Post FEV1/FVC ratio: 77 %
Post FEV6/FVC ratio: 100 %
Pre FEV1/FVC ratio: 73 %
Pre FEV6/FVC Ratio: 100 %
RV % pred: 129 %
RV: 3.31 L
TLC % pred: 92 %
TLC: 6.74 L

## 2015-05-18 LAB — GLUCOSE, CAPILLARY: Glucose-Capillary: 142 mg/dL — ABNORMAL HIGH (ref 65–99)

## 2015-05-18 MED ORDER — NITROGLYCERIN 0.4 MG SL SUBL: 0.4000 mg | SUBLINGUAL_TABLET | SUBLINGUAL | Status: DC | PRN

## 2015-05-18 MED ORDER — ALBUTEROL SULFATE (2.5 MG/3ML) 0.083% IN NEBU
2.5000 mg | INHALATION_SOLUTION | Freq: Once | RESPIRATORY_TRACT | Status: AC
  Administered 2015-05-18: 2.5 mg via RESPIRATORY_TRACT

## 2015-05-18 NOTE — Progress Notes (Signed)
Patient Name: Bradley [name] Date of Encounter: 05/18/2015  Primary Cardiologist: Dr. Tamala Julian   Active Problems:   Peripheral vascular disease [facility])   Essential hypertension   Hyperlipidemia   Dyspnea   Right bundle branch block   CAD (coronary artery disease)   Diverticulosis of colon without hemorrhage    SUBJECTIVE  SOB with minimal exertion, even taking shower. Never had any CP. The stress test was mainly done for DOE, but lead to cath eventually.   CURRENT MEDS . aspirin EC  81 mg Oral Daily  . atorvastatin  40 mg Oral Daily  . calcium-vitamin D  1 tablet Oral Q breakfast  . carvedilol  3.125 mg Oral BID WC  . cholecalciferol  5,000 Units Oral Daily  . enoxaparin (LOVENOX) injection  40 mg Subcutaneous Q24H  . ferrous sulfate  325 mg Oral BID WC  . glimepiride  2 mg Oral QAC breakfast  . Liraglutide  1.8 mg Subcutaneous Daily  . losartan  100 mg Oral Daily  . multivitamin with minerals  1 tablet Oral Daily  . pantoprazole  40 mg Oral Daily  . sodium chloride  3 mL Intravenous Q12H  . vitamin C  250 mg Oral Daily  . vitamin E  400 Units Oral Daily    OBJECTIVE  Filed Vitals:   05/17/15 2115 05/17/15 2358 05/18/15 0426 05/18/15 0925  BP: 159/79 147/73 165/84 154/73  Pulse: 105 94 89   Temp: 97.7 F (36.5 C) 97.7 F (36.5 C) 97.4 F (36.3 C) 98.4 F (36.9 C)  TempSrc: Oral Oral Oral Oral  Resp: 18 17 18    Height:      Weight:   210 lb (95.255 kg)   SpO2: 97% 98% 100% 100%    Intake/Output Summary (Last 24 hours) at 05/18/15 1207 Last data filed at 05/18/15 0855  Gross per 24 hour  Intake    440 ml  Output   1425 ml  Net   -985 ml   Filed Weights   05/17/15 0715 05/17/15 1137 05/18/15 0426  Weight: 210 lb (95.255 kg) 208 lb (94.348 kg) 210 lb (95.255 kg)    PHYSICAL EXAM  General: Pleasant, NAD. Neuro: Alert and oriented X 3. Moves all extremities spontaneously. Psych: Normal affect. HEENT:  Normal  Neck: Supple without bruits or  JVD. Lungs:  Resp regular and unlabored, CTA. Heart: RRR no s3, s4, or murmurs. R radial cath site stable.  Abdomen: Soft, non-tender, non-distended, BS + x 4.  Extremities: No clubbing, cyanosis or edema. DP/PT/Radials 2+ and equal bilaterally.  Accessory Clinical Findings  CBC  Recent Labs  05/17/15 1215  WBC 6.0  HGB 12.0*  HCT 36.5*  MCV 97.1  PLT Q000111Q   Basic Metabolic Panel  Recent Labs  05/17/15 1215  CREATININE 1.48*    TELE NSR without significant ventricular ectopy, baseline BBB    ECG  No new EKG  Echocardiogram 05/08/2015  LV EF: 55% -  60%  ------------------------------------------------------------------- Indications:   Dyspnea (R06.00).  ------------------------------------------------------------------- History:  Risk factors: Carotid artery stenosis. PVD. RBBB. Hypertension. Diabetes mellitus. Dyslipidemia.  ------------------------------------------------------------------- Study Conclusions  - Left ventricle: Global LV longitudinal strain was -15.2% The cavity size was normal. Systolic function was normal. The estimated ejection fraction was in the range of 55% to 60%. Wall motion was normal; there were no regional wall motion abnormalities. There was an increased relative contribution of atrial contraction to ventricular filling. Doppler parameters are consistent with abnormal left ventricular relaxation (grade  1 diastolic dysfunction). - Aortic valve: Moderate thickening and calcification, consistent with sclerosis. - Aorta: Aortic root dimension: 39 mm (ED). - Aortic root: The aortic root was mildly dilated. - Atrial septum: There was increased thickness of the septum, consistent with lipomatous hypertrophy.     Radiology/Studies  Dg Chest 2 View  05/11/2015   CLINICAL DATA:  Pre procedure respiratory exam. For heart catheterization on 05/15/2015. Abnormal stress test.  EXAM: CHEST  2 VIEW  COMPARISON:   Chest x-rays dated 03/16/2013 and 07/30/2011  FINDINGS: Heart size and pulmonary vascularity are normal. No infiltrates or effusions. Chronic peribronchial thickening. No acute osseous abnormality. Calcification in the thoracic aorta. Surgical clips in the left side of the neck.  IMPRESSION: No acute abnormalities. Chronic bronchitic changes. Aortic atherosclerosis.   Electronically Signed   By: Lorriane Shire M.D.   On: 05/11/2015 14:40   Ct Abdomen Pelvis W Contrast  05/09/2015   CLINICAL DATA:  69 year old male with left-sided abdominal pain for the past 3 weeks. Nausea. History of diverticulitis.  EXAM: CT ABDOMEN AND PELVIS WITH CONTRAST  TECHNIQUE: Multidetector CT imaging of the abdomen and pelvis was performed using the standard protocol following bolus administration of intravenous contrast.  CONTRAST:  121mL ISOVUE-300 IOPAMIDOL (ISOVUE-300) INJECTION 61%  COMPARISON:  CT the abdomen and pelvis 12/12/2013.  FINDINGS: Lower chest: Atherosclerotic calcifications in the distal right coronary artery.  Hepatobiliary: No cystic or solid hepatic lesions. No intra or extrahepatic biliary ductal dilatation. High attenuation material layering dependently in the gallbladder, compatible with small gallstones. No evidence of acute cholecystitis at this time.  Pancreas: No pancreatic mass. No pancreatic ductal dilatation. No pancreatic or peripancreatic fluid or inflammatory changes.  Spleen: Unremarkable.  Adrenals/Urinary Tract: Bilateral adrenal glands are normal in appearance. Two calcifications are present within the left renal collecting system, largest of which measures 8 mm in the lower pole. 5.9 cm exophytic low-attenuation lesion in the lower pole of the right kidney is compatible with a cyst. Multiple clustered small low-attenuation lesions in the lower pole of the left kidney, largest of which measures 2 cm in diameter and is compatible with a simple cysts. The smaller lesions in this cluster are too  small to definitively characterize, however, this area overall appears similar to the prior study, presumably multiple small cysts. Cortical thinning in the lower pole of the left kidney, compatible with chronic scarring. No hydroureteronephrosis to suggest urinary tract obstruction at this time. Urinary bladder is normal in appearance.  Stomach/Bowel: Normal appearance of the stomach. No pathologic dilatation of small bowel or colon. Scattered colonic diverticulae are noted, most evident in the sigmoid colon, without surrounding inflammatory changes to suggest an acute diverticulitis at this time. Normal appendix.  Vascular/Lymphatic: Extensive atherosclerosis throughout the abdominal and pelvic vasculature, without evidence of aneurysm or dissection. Aortobifemoral bypass graft noted, widely patent at this time. The native infrarenal abdominal aorta and proximal common iliac arteries are occluded bilaterally. The left common iliac artery is distally reconstituted, presumably secondary to collateral flow. Bilateral external iliac arteries demonstrate some limited flow, also presumably from collateral flow. High-grade stenosis versus complete occlusion of the left superficial femoral artery. Probable high-grade stenosis of the right superficial femoral artery is well. Celiac axis and superior mesenteric artery are both widely patent. Inferior mesenteric artery branches appear patent, however, the origin of the vessel is presumably occluded (arising off the occluded native infrarenal abdominal aorta). No lymphadenopathy noted in the abdomen or pelvis.  Reproductive: Prostate gland and seminal vesicles  are unremarkable in appearance.  Other: No significant volume of ascites.  No pneumoperitoneum.  Musculoskeletal: Status post PLIF at L5-S1. There are no aggressive appearing lytic or blastic lesions noted in the visualized portions of the skeleton.  IMPRESSION: 1. No acute findings in the abdomen or pelvis to account  for the patient's symptoms. 2. Extensive colonic diverticulosis, without evidence to suggest acute diverticulitis at this time. 3. Extensive atherosclerosis status post aortobifemoral bypass graft placement, with multiple vascular findings (most notable for complete occlusion or high-grade stenosis of the left superficial femoral artery, and high-grade stenosis of the proximal right superficial femoral artery), as detailed above. 4. Normal appendix. 5. Additional incidental findings, as above.   Electronically Signed   By: Vinnie Langton M.D.   On: 05/09/2015 16:19    ASSESSMENT AND PLAN  1. Abnormal myoview for DOE  - Myoview 05/08/2015 EF 52%, medium defect of moderate severity present in the basal inferior, mid inferior and apical inferior location, upslopping ST depression in V3-V5  - cath 05/17/2015 90% mid LAD, 80% ost D2, 75% mid RCA, normal EF. Per cath report, evaluate dyspnea, will probably need pulm consult, hold plavix, CT surgery to see and possible discharge after decision making  - per Dr. Servando Snare, possible CABG next week after plavix washout. Pre-CABG doppler done. ?if pt can be discharged on nitro and come back for surgery next week, inpt vs outpt pulm consult, although his breathing issue could be related to underlying CAD, I do not see a PFT in EPIC  Addendum: looks like he is on the master OR schedule for CABG next Wednesday 10/12 at 8:15AM by Dr. Servando Snare  2. PAD with claudication, h/o aorto-bifem 1998 3. Carotid artery stenosis s/p L CEA 1994 with redo in 2001 4. HTN 5. HLD 6. DM 2 7. OSA 8. Chronic RBBB 9. Prior CVA   Signed, Woodward Ku Pager: [phone]  I have seen and examined the patient along with Almyra Deforest PA-C.  I have reviewed the chart, notes and new data.  I agree with PA's note.  Key new complaints: feels well at rest Key examination changes: no signs of CHF    PLAN: PFTs prior to DC today. CABG next Wednesday. Normal LVEF and normal LVEDP at cath -  no objective evidence that CHF is cause of his dyspnea (angina equivalent?). Does have remote history of smoking.  Sanda Klein, MD, [place] [phone] 05/18/2015, 1:53 PM

## 2015-05-18 NOTE — Progress Notes (Signed)
Came for the third time today to discuss OHS and pt in PFTs. Discussed mobility, sternal precautions, IS and d/c planning with wife. Gave OHS booklet, guideline, and video to watch. Wife voiced understanding and sts he does use his arms to stand. Demonstrated mechanics to stand without arms.  [place], ACSM 2:51 PM 05/18/2015

## 2015-05-18 NOTE — Discharge Instructions (Signed)
Please make sure you stop plavix at least 5-7 days prior to surgery  Also please hold Metformin for 24 hours prior to surgery.   Cardiothoracic surgery team will contact you early next week to give you further preoperative instruction  No lifting over 5 lbs for 1 week. No sexual activity for 1 week. Keep procedure site clean & dry. If you notice increased pain, swelling, bleeding or pus, call/return!  You may shower, but no soaking baths/hot tubs/pools for 1 week.

## 2015-05-18 NOTE — Discharge Summary (Signed)
Discharge Summary   Patient ID: Bradley [name],  MRN: [id number], DOB/AGE: 15-Oct-1945 69 y.o.  Admit date: 05/17/2015 Discharge date: 05/19/2015  Primary Care Provider:  Melinda Crutch Primary Cardiologist: Dr. Tamala Julian  Discharge Diagnoses Principal Problem:   Abnormal stress test Active Problems:   Peripheral vascular disease [facility])   Essential hypertension   Hyperlipidemia   Dyspnea   Right bundle branch block   CAD (coronary artery disease)   Diverticulosis of colon without hemorrhage   Allergies Allergies  Allergen Reactions  . Dilaudid Cough Nausea Only  . Food     "garlic, tomatoes, acid foods"= mouth blisters  . Hydrocodone Nausea Only  . Oxycodone Nausea Only  . Ultram [Tramadol Hcl] Nausea Only  . Penicillins Rash  . Sulfa Drugs Cross Reactors Rash    Procedures  Cardiac catheterization 05/17/2015 Conclusion    1. Mid LAD lesion, 90% stenosed. 2. Ost 2nd Diag lesion, 80% stenosed. 3. Mid RCA lesion, 75% stenosed. 4. Prox RCA lesion, 35% stenosed.   Complex mid LAD, calcified 90+ percent stenosis prior to a trifurcation. The larger diagonal branch also has 80% stenosis.  75% mid RCA. RCA is a dominant vessel. The proximal RCA contains an eccentric region of calcification or dissection.  Widely patent circumflex  Overall normal LV function  The patient's clinical presentation is that of exertional dyspnea and not angina. In this prior smoker concerned that dyspnea may be more pulmonary than cardiac needs to be considered  Recommendations:   Evaluation by TCTS to consider whether CABG versus PCI would be best treatment option. PCI would be higher risk for ischemic complications and both territories.  Evaluation of dyspnea and will probably need a pulmonary consult.  Will hold Plavix for the time being.  Probable discharge in a.m. after decision making, reevaluation of kidney function, and further discussion with patient and family.      PFT  05/18/2015  Conclusions: Although there is airway obstruction and a diffusion defect suggesting emphysema, the absence of overinflation is inconsistent with that diagnosis.  Pulmonary Function Diagnosis: Mild Obstructive Airways Disease Mild Diffusion Defect     Hospital Course  The patient is a 69 year old male with past medical history of HTN, HLD, DM2, OSA, chronic RBBB, prior CVA, carotid artery stenosis s/p L CEA 1994 with redo in 2001 and PAD s/p h/o aorta-bifem 1998. He recently has been having increasing shortness breath with minimal exertion. A Myoview was obtained on 05/08/2015 which showed EF 52%, medium defect of moderate severity present in the basal inferior, mid inferior and apical inferior location, upslopping ST depression in V3-V5. He underwent diagnostic cardiac catheterization on 05/17/2015 which showed 3v dx, 90% mid LAD, 80% ost D2, 75% mid RCA, normal EF.  post cath, CT surgery consult was obtained, his Plavix was held in anticipation of surgery. He was seen by Dr. Servando Snare and CABG was planned for 05/23/2015 after pre-op doppler study and PFT.   He was seen in the morning of 05/18/2015, at which time he denies any significant shortness of breath or chest discomfort. His right radial cath site appears to be stable without significant bleeding or hematoma. Standard post-cath instructions has been given to him. He was seen by Dr. Servando Snare prior to discharge who has cleared him for discharge and instructed him to seek medical attention if has recurrent symptom prior to CABG. He is deemed stable for discharge from cardiology perspective to come back at later time for CABG. I have discussed with [facility] nurse who plan  to contact the patient on Monday to give him preoperative instructions. During meantime, his Plavix has been discontinued to allow adequate time for Plavix washout prior to surgery. I have given him nitroglycerin in case he has recurrent chest pain.   Discharge Vitals Blood  pressure 147/78, pulse 90, temperature 97.6 F (36.4 C), temperature source Oral, resp. rate 18, height 6' (1.829 m), weight 210 lb (95.255 kg), SpO2 100 %.  Filed Weights   05/17/15 0715 05/17/15 1137 05/18/15 0426  Weight: 210 lb (95.255 kg) 208 lb (94.348 kg) 210 lb (95.255 kg)    Labs  CBC  Recent Labs  05/17/15 1215  WBC 6.0  HGB 12.0*  HCT 36.5*  MCV 97.1  PLT Q000111Q   Basic Metabolic Panel  Recent Labs  05/17/15 1215  CREATININE 1.48*    Disposition  Pt is being discharged home today in good condition.  Follow-up Plans & Appointments      Follow-up Information    Follow up with Grace Isaac, MD.   Specialty:  Cardiothoracic Surgery   Why:  Office will contact you on Monday to give you preoperative instruction regarding surgery on Wednesday 05/23/2015 8:15AM   Contact information:   [place] [address] [phone]       Follow up with Sinclair Grooms, MD.   Specialty:  Cardiology   Why:  Schedule followup with Dr. Tamala Julian after discharge from hospital from bypass surgery   Contact information:   [id number] [place] [address] [phone]       Discharge Medications    Medication List    STOP taking these medications        clopidogrel 75 MG tablet  Commonly known as:  PLAVIX      TAKE these medications        aspirin 81 MG tablet  Take 81 mg by mouth daily.     atorvastatin 40 MG tablet  Commonly known as:  LIPITOR  Take 40 mg by mouth daily.     Calcium-D 600-400 MG-UNIT Tabs  Take 1 tablet by mouth daily.     carvedilol 3.125 MG tablet  Commonly known as:  COREG  Take 3.125 mg by mouth 2 (two) times daily with a meal.     D-3-5 5000 UNITS capsule  Generic drug:  Cholecalciferol  Take 5,000 Units by mouth daily.     DEXILANT 60 MG capsule  Generic drug:  dexlansoprazole  Take 60 mg by mouth daily.     glimepiride 2 MG tablet  Commonly known as:  AMARYL  Take 2  mg by mouth daily before breakfast.     IRON (FERROUS GLUCONATE) PO  Take 40 mg by mouth 2 (two) times daily.     losartan 100 MG tablet  Commonly known as:  COZAAR  Take 100 mg by mouth Daily.     metFORMIN 500 MG 24 hr tablet  Commonly known as:  GLUCOPHAGE-XR  Take 1,000 mg by mouth 2 (two) times daily.     multivitamin capsule  Take 1 capsule by mouth daily.     nitroGLYCERIN 0.4 MG SL tablet  Commonly known as:  NITROSTAT  Place 1 tablet (0.4 mg total) under the tongue every 5 (five) minutes as needed.     VICTOZA [place]  Inject 1.8 mg into the skin daily.     vitamin C 100 MG tablet  Take 100 mg by mouth daily.  vitamin E 400 UNIT capsule  Take 400 Units by mouth daily.        Duration of Discharge Encounter   Greater than 30 minutes including physician time.  Hilbert Corrigan PA-C Pager: [phone] 05/19/2015, 7:15 AM

## 2015-05-18 NOTE — Progress Notes (Signed)
PFT result noted, mild obstructive dx. Discussed with Rema Jasmine nurse, Dr. Servando Snare will see the patient today. Will ask Ignacia Bayley NP to followup with CT surgery recommendation to see if he can be discharged. Noted he is on schedule for CABG next Wednesday by Dr. Servando Snare at 8:15am.  Signed, Almyra Deforest [place] Pager: [phone]

## 2015-05-18 NOTE — Progress Notes (Signed)
      [address]       [place] [address]             [phone]                 1 Day Post-Op Procedure(s) (LRB): Left Heart Cath and Coronary Angiography (N/A)    Subjective: No chest pain   Objective: Vital signs in last 24 hours: Patient Vitals for the past 24 hrs:  BP Temp Temp src Pulse Resp SpO2 Weight  05/18/15 1603 (!) 147/78 mmHg 97.6 F (36.4 C) Oral 90 - - -  05/18/15 0925 (!) 154/73 mmHg 98.4 F (36.9 C) Oral - - 100 % -  05/18/15 0426 (!) 165/84 mmHg 97.4 F (36.3 C) Oral 89 18 100 % 210 lb (95.255 kg)  05/17/15 2358 (!) 147/73 mmHg 97.7 F (36.5 C) Oral 94 17 98 % -  05/17/15 2115 (!) 159/79 mmHg 97.7 F (36.5 C) Oral (!) 105 18 97 % -    Filed Weights   05/17/15 0715 05/17/15 1137 05/18/15 0426  Weight: 210 lb (95.255 kg) 208 lb (94.348 kg) 210 lb (95.255 kg)    Hemodynamic parameters for last 24 hours:    Intake/Output from previous day: 10/06 0701 - 10/07 0700 In: 440 [P.O.:440] Out: 1775 [Urine:1775] Intake/Output this shift: Total I/O In: -  Out: 1175 [Urine:1175]  Scheduled Meds: . aspirin EC  81 mg Oral Daily  . atorvastatin  40 mg Oral Daily  . calcium-vitamin D  1 tablet Oral Q breakfast  . carvedilol  3.125 mg Oral BID WC  . cholecalciferol  5,000 Units Oral Daily  . enoxaparin (LOVENOX) injection  40 mg Subcutaneous Q24H  . ferrous sulfate  325 mg Oral BID WC  . glimepiride  2 mg Oral QAC breakfast  . Liraglutide  1.8 mg Subcutaneous Daily  . losartan  100 mg Oral Daily  . multivitamin with minerals  1 tablet Oral Daily  . pantoprazole  40 mg Oral Daily  . sodium chloride  3 mL Intravenous Q12H  . vitamin C  250 mg Oral Daily  . vitamin E  400 Units Oral Daily   Continuous Infusions:  PRN Meds:.sodium chloride, acetaminophen, ondansetron (ZOFRAN) IV, sodium chloride  General appearance: alert and cooperative Neurologic: intact Heart: regular rate and rhythm, S1, S2 normal, no murmur, click, rub or  gallop Lungs: clear to auscultation bilaterally Abdomen: soft, non-tender; bowel sounds normal; no masses,  no organomegaly Extremities: extremities normal, atraumatic, no cyanosis or edema and Homans sign is negative, no sign of DVT  Lab Results: CBC: Recent Labs  05/17/15 1215  WBC 6.0  HGB 12.0*  HCT 36.5*  PLT 229   BMET:  Recent Labs  05/17/15 1215  CREATININE 1.48*    PT/INR: No results for input(s): LABPROT, INR in the last 72 hours.   Radiology No results found.   Assessment/Plan: S/P Procedure(s) (LRB): Left Heart Cath and Coronary Angiography (N/A) Plan cabg Wednesday,patient questions answered and he wishes to proceed.  patient says DR Tamala Julian was sending him home before surgery.  Cautioned him if he goes home to avoid exertion and call [phone] if recurrent symptoms. My office will contact to arrange follow up labs preop.   Grace Isaac MD 05/18/2015 5:55 PM

## 2015-05-18 NOTE — Progress Notes (Signed)
Pre-op Cardiac Surgery  Carotid Findings:  60-79% Right ICA stenosis.  Left CEA is patent. Vertebral artery flow is antegrade.   Upper Extremity Right Left  Brachial Pressures 172T 186T  Radial Waveforms T T  Ulnar Waveforms T T  Palmar Arch (Allen's Test) WNL Doppler signal remains normal with radial compression and obliterates with ulnar compression   Findings:      Lower  Extremity Right Left  Dorsalis Pedis    Anterior Tibial 57M 129M  Posterior Tibial 81M 109M  Ankle/Brachial Indices 0.51 0.69    Findings:  Right ABI indicates moderate to severe reduction in arterial flow.  Left ABI indicates moderate reduction in arterial blood flow.

## 2015-05-21 ENCOUNTER — Encounter (HOSPITAL_COMMUNITY): Payer: Self-pay

## 2015-05-21 ENCOUNTER — Other Ambulatory Visit: Payer: Self-pay | Admitting: *Deleted

## 2015-05-21 ENCOUNTER — Encounter (HOSPITAL_COMMUNITY): Payer: PPO

## 2015-05-21 ENCOUNTER — Encounter (HOSPITAL_COMMUNITY)
Admission: RE | Admit: 2015-05-21 | Discharge: 2015-05-21 | Disposition: A | Discharge status: Home / Self Care | Payer: PPO | Source: Ambulatory Visit | Attending: Cardiothoracic Surgery | Admitting: Cardiothoracic Surgery

## 2015-05-21 VITALS — BP 116/72 | HR 84 | Temp 97.8°F | Resp 20 | Ht 71.5 in | Wt 210.9 lb

## 2015-05-21 DIAGNOSIS — E872 Acidosis: Secondary | ICD-10-CM | POA: Diagnosis present

## 2015-05-21 DIAGNOSIS — Z880 Allergy status to penicillin: Secondary | ICD-10-CM

## 2015-05-21 DIAGNOSIS — Z7982 Long term (current) use of aspirin: Secondary | ICD-10-CM

## 2015-05-21 DIAGNOSIS — Z79899 Other long term (current) drug therapy: Secondary | ICD-10-CM

## 2015-05-21 DIAGNOSIS — N183 Chronic kidney disease, stage 3 (moderate): Secondary | ICD-10-CM | POA: Diagnosis present

## 2015-05-21 DIAGNOSIS — E876 Hypokalemia: Secondary | ICD-10-CM | POA: Diagnosis not present

## 2015-05-21 DIAGNOSIS — E8770 Fluid overload, unspecified: Secondary | ICD-10-CM | POA: Diagnosis not present

## 2015-05-21 DIAGNOSIS — R0602 Shortness of breath: Secondary | ICD-10-CM | POA: Diagnosis not present

## 2015-05-21 DIAGNOSIS — Z885 Allergy status to narcotic agent status: Secondary | ICD-10-CM

## 2015-05-21 DIAGNOSIS — G4733 Obstructive sleep apnea (adult) (pediatric): Secondary | ICD-10-CM | POA: Diagnosis present

## 2015-05-21 DIAGNOSIS — I679 Cerebrovascular disease, unspecified: Secondary | ICD-10-CM | POA: Diagnosis present

## 2015-05-21 DIAGNOSIS — D62 Acute posthemorrhagic anemia: Secondary | ICD-10-CM | POA: Diagnosis not present

## 2015-05-21 DIAGNOSIS — E785 Hyperlipidemia, unspecified: Secondary | ICD-10-CM | POA: Diagnosis present

## 2015-05-21 DIAGNOSIS — Z91018 Allergy to other foods: Secondary | ICD-10-CM

## 2015-05-21 DIAGNOSIS — Z87891 Personal history of nicotine dependence: Secondary | ICD-10-CM

## 2015-05-21 DIAGNOSIS — I2510 Atherosclerotic heart disease of native coronary artery without angina pectoris: Principal | ICD-10-CM | POA: Diagnosis present

## 2015-05-21 DIAGNOSIS — M1990 Unspecified osteoarthritis, unspecified site: Secondary | ICD-10-CM | POA: Diagnosis present

## 2015-05-21 DIAGNOSIS — M810 Age-related osteoporosis without current pathological fracture: Secondary | ICD-10-CM | POA: Diagnosis present

## 2015-05-21 DIAGNOSIS — Z882 Allergy status to sulfonamides status: Secondary | ICD-10-CM

## 2015-05-21 DIAGNOSIS — E1151 Type 2 diabetes mellitus with diabetic peripheral angiopathy without gangrene: Secondary | ICD-10-CM | POA: Diagnosis present

## 2015-05-21 DIAGNOSIS — Z7984 Long term (current) use of oral hypoglycemic drugs: Secondary | ICD-10-CM

## 2015-05-21 DIAGNOSIS — Z01812 Encounter for preprocedural laboratory examination: Secondary | ICD-10-CM

## 2015-05-21 DIAGNOSIS — I480 Paroxysmal atrial fibrillation: Secondary | ICD-10-CM | POA: Diagnosis not present

## 2015-05-21 DIAGNOSIS — K567 Ileus, unspecified: Secondary | ICD-10-CM | POA: Diagnosis not present

## 2015-05-21 DIAGNOSIS — R9439 Abnormal result of other cardiovascular function study: Secondary | ICD-10-CM | POA: Diagnosis present

## 2015-05-21 DIAGNOSIS — I77810 Thoracic aortic ectasia: Secondary | ICD-10-CM | POA: Diagnosis present

## 2015-05-21 DIAGNOSIS — Z0183 Encounter for blood typing: Secondary | ICD-10-CM | POA: Insufficient documentation

## 2015-05-21 DIAGNOSIS — I6521 Occlusion and stenosis of right carotid artery: Secondary | ICD-10-CM | POA: Diagnosis present

## 2015-05-21 DIAGNOSIS — I252 Old myocardial infarction: Secondary | ICD-10-CM

## 2015-05-21 DIAGNOSIS — I129 Hypertensive chronic kidney disease with stage 1 through stage 4 chronic kidney disease, or unspecified chronic kidney disease: Secondary | ICD-10-CM | POA: Diagnosis present

## 2015-05-21 DIAGNOSIS — Z8673 Personal history of transient ischemic attack (TIA), and cerebral infarction without residual deficits: Secondary | ICD-10-CM

## 2015-05-21 DIAGNOSIS — K589 Irritable bowel syndrome without diarrhea: Secondary | ICD-10-CM | POA: Diagnosis present

## 2015-05-21 DIAGNOSIS — K210 Gastro-esophageal reflux disease with esophagitis: Secondary | ICD-10-CM | POA: Diagnosis present

## 2015-05-21 DIAGNOSIS — E1122 Type 2 diabetes mellitus with diabetic chronic kidney disease: Secondary | ICD-10-CM | POA: Diagnosis present

## 2015-05-21 HISTORY — DX: Diverticulitis of intestine, part unspecified, without perforation or abscess without bleeding: K57.92

## 2015-05-21 HISTORY — DX: Presence of spectacles and contact lenses: Z97.3

## 2015-05-21 LAB — BLOOD GAS, ARTERIAL
Acid-base deficit: 3 mmol/L — ABNORMAL HIGH (ref 0.0–2.0)
Bicarbonate: 21.5 mEq/L (ref 20.0–24.0)
Drawn by: 206361
FIO2: 0.21
O2 Content: 0.2 L/min
O2 Saturation: 94.9 %
Patient temperature: 98.6
TCO2: 22.7 mmol/L (ref 0–100)
pCO2 arterial: 38.6 mmHg (ref 35.0–45.0)
pH, Arterial: 7.365 (ref 7.350–7.450)
pO2, Arterial: 76.1 mmHg — ABNORMAL LOW (ref 80.0–100.0)

## 2015-05-21 LAB — URINALYSIS, ROUTINE W REFLEX MICROSCOPIC
Bilirubin Urine: NEGATIVE
Glucose, UA: NEGATIVE mg/dL
Hgb urine dipstick: NEGATIVE
Ketones, ur: NEGATIVE mg/dL
Leukocytes, UA: NEGATIVE
Nitrite: NEGATIVE
Protein, ur: NEGATIVE mg/dL
Specific Gravity, Urine: 1.014 (ref 1.005–1.030)
Urobilinogen, UA: 0.2 mg/dL (ref 0.0–1.0)
pH: 6 (ref 5.0–8.0)

## 2015-05-21 LAB — COMPREHENSIVE METABOLIC PANEL
ALT: 17 U/L (ref 17–63)
AST: 16 U/L (ref 15–41)
Albumin: 3.7 g/dL (ref 3.5–5.0)
Alkaline Phosphatase: 55 U/L (ref 38–126)
Anion gap: 10 (ref 5–15)
BUN: 19 mg/dL (ref 6–20)
CO2: 21 mmol/L — ABNORMAL LOW (ref 22–32)
Calcium: 9.8 mg/dL (ref 8.9–10.3)
Chloride: 106 mmol/L (ref 101–111)
Creatinine, Ser: 1.48 mg/dL — ABNORMAL HIGH (ref 0.61–1.24)
GFR calc Af Amer: 53 mL/min — ABNORMAL LOW (ref >60)
GFR calc non Af Amer: 46 mL/min — ABNORMAL LOW (ref >60)
Glucose, Bld: 128 mg/dL — ABNORMAL HIGH (ref 65–99)
Potassium: 4.6 mmol/L (ref 3.5–5.1)
Sodium: 137 mmol/L (ref 135–145)
Total Bilirubin: 0.9 mg/dL (ref 0.3–1.2)
Total Protein: 6.8 g/dL (ref 6.5–8.1)

## 2015-05-21 LAB — CBC
HCT: 40.7 % (ref 39.0–52.0)
Hemoglobin: 13.6 g/dL (ref 13.0–17.0)
MCH: 32.1 pg (ref 26.0–34.0)
MCHC: 33.4 g/dL (ref 30.0–36.0)
MCV: 96 fL (ref 78.0–100.0)
Platelets: 263 10*3/uL (ref 150–400)
RBC: 4.24 MIL/uL (ref 4.22–5.81)
RDW: 13 % (ref 11.5–15.5)
WBC: 8.7 10*3/uL (ref 4.0–10.5)

## 2015-05-21 LAB — TYPE AND SCREEN
ABO/RH(D): A NEG
Antibody Screen: NEGATIVE

## 2015-05-21 LAB — SURGICAL PCR SCREEN
MRSA, PCR: NEGATIVE
Staphylococcus aureus: NEGATIVE

## 2015-05-21 LAB — PROTIME-INR
INR: 1.01 (ref 0.00–1.49)
Prothrombin Time: 13.5 seconds (ref 11.6–15.2)

## 2015-05-21 LAB — GLUCOSE, CAPILLARY: Glucose-Capillary: 123 mg/dL — ABNORMAL HIGH (ref 65–99)

## 2015-05-21 LAB — APTT: aPTT: 27 seconds (ref 24–37)

## 2015-05-21 NOTE — Progress Notes (Signed)
Pt denies SOB and chest pain but is under the care of Dr. Daneen Schick , cardiology. Pt chart forwarded to [place], [place], anesthesia for review.

## 2015-05-21 NOTE — Pre-Procedure Instructions (Signed)
Bradley [name]  05/21/2015      [id number] Bradley [name], [place] [facility] [place] [address] [place] [address] Phone: [phone] Fax: [phone]    Your procedure is scheduled on Wednesday, May 23, 2015  Report to [facility] Admitting at 6:30 A.M.  Call this number if you have problems the morning of surgery:  [phone]   Remember: Bring the Incentive spirometer ( breathing device ), cardiac book and ointment (if needed) on day of admission.  Do not eat food or drink liquids after midnight Tuesday, May 22, 2015  Take these medicines the morning of surgery with A SIP OF WATER: carvedilol (COREG), dexlansoprazole (DEXILANT), if needed: nitroGLYCERIN (NITROSTAT) for chest pain  Do not take any oral diabetes medicine (pills) the morning of surgery such as glimepiride (AMARYL) and metFORMIN (GLUCOPHAGE-XR).  Do not take other diabetes injectables the day of surgery including Byetta, Victoza, Bydureon, and Trulicity.  Stop taking vitamins and herbal medications such as Vitamin E. Do not take any NSAIDs ie: Ibuprofen, Advil, Naproxen or etc.: stop now. How to Manage Your Diabetes Before Surgery Why is it important to control my blood sugar before and after surgery?   Improving blood sugar levels before and after surgery helps healing and can limit problems.  A way of improving blood sugar control is eating a healthy diet by:  - Eating less sugar and carbohydrates  - Increasing activity/exercise  - Talk with your doctor about reaching your blood sugar goals  High blood sugars (greater than 180 mg/dL) can raise your risk of infections and slow down your recovery so you will need to focus on controlling your diabetes during the weeks before surgery.  Make sure that the doctor who takes care of your diabetes knows about your planned surgery including the date and location.  How do I manage my blood sugars before surgery?   Check your  blood sugar at least 4 times a day, 2 days before surgery to make sure that they are not too high or low.   Check your blood sugar the morning of your surgery when you wake up and every 2               hours until you get to the Short-Stay unit.  If your blood sugar is less than 70 mg/dL, you will need to treat for low blood sugar by:  Treat a low blood sugar (less than 70 mg/dL) with 1/2 cup of clear juice (cranberry or apple), 4 glucose tablets, OR glucose gel.  Recheck blood sugar in 15 minutes after treatment (to make sure it is greater than 70 mg/dL).  If blood sugar is not greater than 70 mg/dL on re-check, call [phone] for further instructions.   Report your blood sugar to the Short-Stay nurse when you get to Short-Stay.  References:  [facility], 2007 "How to Manage your Diabetes Before and After Surgery".   If your CBG is greater than 220 mg/dL, you may take 1/2 of your sliding scale (correction) dose of insulin.  Do not wear jewelry, make-up or nail polish.  Do not wear lotions, powders, or perfumes.  You may not wear deodorant.  Do not shave 48 hours prior to surgery.  Men may shave face and neck.  Do not bring valuables to the hospital.  [facility] is not responsible for any belongings or valuables.  Contacts, dentures or bridgework may not be worn into surgery.  Leave your suitcase in the car.  After surgery it may be brought to your room.  For patients admitted to the hospital, discharge time will be determined by your treatment team.  Patients discharged the day of surgery will not be allowed to drive home.   Name and phone number of your driver: N/A Special instructions: Shower the night before surgery and the morning of surgery with CHG.  Please read over the following fact sheets that you were given. Pain Booklet, Coughing and Deep Breathing, Blood Transfusion Information, Open Heart Packet, MRSA Information and Surgical Site  Infection Prevention

## 2015-05-22 LAB — HEMOGLOBIN A1C
Hgb A1c MFr Bld: 8.1 % — ABNORMAL HIGH (ref 4.8–5.6)
Mean Plasma Glucose: 186 mg/dL

## 2015-05-22 MED ORDER — SODIUM CHLORIDE 0.9 % IV SOLN
1500.0000 mg | INTRAVENOUS | Status: AC
  Administered 2015-05-23: 1500 mg via INTRAVENOUS
  Filled 2015-05-22: qty 1500

## 2015-05-22 MED ORDER — CHLORHEXIDINE GLUCONATE 0.12 % MT SOLN
15.0000 mL | Freq: Once | OROMUCOSAL | Status: DC
  Filled 2015-05-22: qty 15

## 2015-05-22 MED ORDER — METOPROLOL TARTRATE 12.5 MG HALF TABLET: 12.5000 mg | ORAL_TABLET | Freq: Once | ORAL | Status: DC

## 2015-05-22 MED ORDER — NITROGLYCERIN IN D5W 200-5 MCG/ML-% IV SOLN
2.0000 ug/min | INTRAVENOUS | Status: AC
  Administered 2015-05-23: 5 ug/min via INTRAVENOUS
  Filled 2015-05-22: qty 250

## 2015-05-22 MED ORDER — MAGNESIUM SULFATE 50 % IJ SOLN
40.0000 meq | INTRAMUSCULAR | Status: DC
  Filled 2015-05-22: qty 10

## 2015-05-22 MED ORDER — CHLORHEXIDINE GLUCONATE 4 % EX LIQD: 30.0000 mL | CUTANEOUS | Status: DC

## 2015-05-22 MED ORDER — DEXMEDETOMIDINE HCL IN NACL 400 MCG/100ML IV SOLN
0.1000 ug/kg/h | INTRAVENOUS | Status: AC
Start: 2015-05-23 — End: 2015-05-23
  Administered 2015-05-23: .3 ug/kg/h via INTRAVENOUS
  Filled 2015-05-22: qty 100

## 2015-05-22 MED ORDER — PLASMA-LYTE 148 IV SOLN
INTRAVENOUS | Status: AC
  Administered 2015-05-23: 500 mL
  Filled 2015-05-22: qty 2.5

## 2015-05-22 MED ORDER — SODIUM CHLORIDE 0.9 % IV SOLN
INTRAVENOUS | Status: DC
  Filled 2015-05-22: qty 30

## 2015-05-22 MED ORDER — POTASSIUM CHLORIDE 2 MEQ/ML IV SOLN
80.0000 meq | INTRAVENOUS | Status: DC
  Filled 2015-05-22: qty 40

## 2015-05-22 MED ORDER — PHENYLEPHRINE HCL 10 MG/ML IJ SOLN
30.0000 ug/min | INTRAMUSCULAR | Status: DC
  Filled 2015-05-22: qty 2

## 2015-05-22 MED ORDER — SODIUM CHLORIDE 0.9 % IV SOLN
INTRAVENOUS | Status: AC
  Administered 2015-05-23: 69.8 mL/h via INTRAVENOUS
  Filled 2015-05-22: qty 40

## 2015-05-22 MED ORDER — INSULIN REGULAR HUMAN 100 UNIT/ML IJ SOLN
INTRAMUSCULAR | Status: AC
  Administered 2015-05-23: 1 [IU]/h via INTRAVENOUS
  Filled 2015-05-22: qty 2.5

## 2015-05-22 MED ORDER — LEVOFLOXACIN IN D5W 500 MG/100ML IV SOLN
500.0000 mg | INTRAVENOUS | Status: AC
  Administered 2015-05-23: 500 mg via INTRAVENOUS
  Filled 2015-05-22: qty 100

## 2015-05-22 MED ORDER — DOPAMINE-DEXTROSE 3.2-5 MG/ML-% IV SOLN
0.0000 ug/kg/min | INTRAVENOUS | Status: AC
  Administered 2015-05-23: 3 ug/kg/min via INTRAVENOUS
  Filled 2015-05-22: qty 250

## 2015-05-22 MED ORDER — EPINEPHRINE HCL 1 MG/ML IJ SOLN
0.0000 ug/min | INTRAVENOUS | Status: DC
  Filled 2015-05-22: qty 4

## 2015-05-23 ENCOUNTER — Encounter (HOSPITAL_COMMUNITY)
Admission: RE | Disposition: A | Discharge status: Home / Self Care | Payer: PPO | Source: Ambulatory Visit | Attending: Cardiothoracic Surgery

## 2015-05-23 ENCOUNTER — Inpatient Hospital Stay (HOSPITAL_COMMUNITY)
Admission: RE | Admit: 2015-05-23 | Discharge: 2015-05-29 | DRG: 236 | Disposition: A | Discharge status: Home / Self Care | Payer: PPO | Source: Ambulatory Visit | Attending: Cardiothoracic Surgery | Admitting: Cardiothoracic Surgery

## 2015-05-23 ENCOUNTER — Inpatient Hospital Stay (HOSPITAL_COMMUNITY): Payer: PPO | Admitting: Certified Registered Nurse Anesthetist

## 2015-05-23 ENCOUNTER — Encounter (HOSPITAL_COMMUNITY): Payer: Self-pay | Admitting: Certified Registered Nurse Anesthetist

## 2015-05-23 ENCOUNTER — Inpatient Hospital Stay (HOSPITAL_COMMUNITY): Payer: PPO

## 2015-05-23 DIAGNOSIS — I2510 Atherosclerotic heart disease of native coronary artery without angina pectoris: Secondary | ICD-10-CM | POA: Diagnosis present

## 2015-05-23 DIAGNOSIS — E785 Hyperlipidemia, unspecified: Secondary | ICD-10-CM | POA: Diagnosis present

## 2015-05-23 DIAGNOSIS — Z91018 Allergy to other foods: Secondary | ICD-10-CM | POA: Diagnosis not present

## 2015-05-23 DIAGNOSIS — N183 Chronic kidney disease, stage 3 (moderate): Secondary | ICD-10-CM | POA: Diagnosis present

## 2015-05-23 DIAGNOSIS — I4891 Unspecified atrial fibrillation: Secondary | ICD-10-CM | POA: Diagnosis not present

## 2015-05-23 DIAGNOSIS — E876 Hypokalemia: Secondary | ICD-10-CM | POA: Diagnosis not present

## 2015-05-23 DIAGNOSIS — R0602 Shortness of breath: Secondary | ICD-10-CM | POA: Diagnosis present

## 2015-05-23 DIAGNOSIS — Z7982 Long term (current) use of aspirin: Secondary | ICD-10-CM | POA: Diagnosis not present

## 2015-05-23 DIAGNOSIS — E1159 Type 2 diabetes mellitus with other circulatory complications: Secondary | ICD-10-CM | POA: Diagnosis not present

## 2015-05-23 DIAGNOSIS — I480 Paroxysmal atrial fibrillation: Secondary | ICD-10-CM | POA: Diagnosis not present

## 2015-05-23 DIAGNOSIS — I6521 Occlusion and stenosis of right carotid artery: Secondary | ICD-10-CM | POA: Diagnosis present

## 2015-05-23 DIAGNOSIS — Z882 Allergy status to sulfonamides status: Secondary | ICD-10-CM | POA: Diagnosis not present

## 2015-05-23 DIAGNOSIS — K210 Gastro-esophageal reflux disease with esophagitis: Secondary | ICD-10-CM | POA: Diagnosis present

## 2015-05-23 DIAGNOSIS — I481 Persistent atrial fibrillation: Secondary | ICD-10-CM | POA: Diagnosis not present

## 2015-05-23 DIAGNOSIS — E1122 Type 2 diabetes mellitus with diabetic chronic kidney disease: Secondary | ICD-10-CM | POA: Diagnosis present

## 2015-05-23 DIAGNOSIS — Z7984 Long term (current) use of oral hypoglycemic drugs: Secondary | ICD-10-CM | POA: Diagnosis not present

## 2015-05-23 DIAGNOSIS — I10 Essential (primary) hypertension: Secondary | ICD-10-CM | POA: Diagnosis not present

## 2015-05-23 DIAGNOSIS — K589 Irritable bowel syndrome without diarrhea: Secondary | ICD-10-CM | POA: Diagnosis present

## 2015-05-23 DIAGNOSIS — I77810 Thoracic aortic ectasia: Secondary | ICD-10-CM | POA: Diagnosis present

## 2015-05-23 DIAGNOSIS — D62 Acute posthemorrhagic anemia: Secondary | ICD-10-CM | POA: Diagnosis not present

## 2015-05-23 DIAGNOSIS — I252 Old myocardial infarction: Secondary | ICD-10-CM | POA: Diagnosis not present

## 2015-05-23 DIAGNOSIS — Z79899 Other long term (current) drug therapy: Secondary | ICD-10-CM | POA: Diagnosis not present

## 2015-05-23 DIAGNOSIS — Z87891 Personal history of nicotine dependence: Secondary | ICD-10-CM | POA: Diagnosis not present

## 2015-05-23 DIAGNOSIS — Z951 Presence of aortocoronary bypass graft: Secondary | ICD-10-CM

## 2015-05-23 DIAGNOSIS — Z880 Allergy status to penicillin: Secondary | ICD-10-CM | POA: Diagnosis not present

## 2015-05-23 DIAGNOSIS — Z9689 Presence of other specified functional implants: Secondary | ICD-10-CM

## 2015-05-23 DIAGNOSIS — Z885 Allergy status to narcotic agent status: Secondary | ICD-10-CM | POA: Diagnosis not present

## 2015-05-23 DIAGNOSIS — I129 Hypertensive chronic kidney disease with stage 1 through stage 4 chronic kidney disease, or unspecified chronic kidney disease: Secondary | ICD-10-CM | POA: Diagnosis present

## 2015-05-23 DIAGNOSIS — E8770 Fluid overload, unspecified: Secondary | ICD-10-CM | POA: Diagnosis not present

## 2015-05-23 DIAGNOSIS — Z8673 Personal history of transient ischemic attack (TIA), and cerebral infarction without residual deficits: Secondary | ICD-10-CM | POA: Diagnosis not present

## 2015-05-23 DIAGNOSIS — R079 Chest pain, unspecified: Secondary | ICD-10-CM | POA: Diagnosis not present

## 2015-05-23 DIAGNOSIS — Z4682 Encounter for fitting and adjustment of non-vascular catheter: Secondary | ICD-10-CM

## 2015-05-23 DIAGNOSIS — E872 Acidosis: Secondary | ICD-10-CM | POA: Diagnosis present

## 2015-05-23 DIAGNOSIS — K567 Ileus, unspecified: Secondary | ICD-10-CM | POA: Diagnosis not present

## 2015-05-23 DIAGNOSIS — R9439 Abnormal result of other cardiovascular function study: Secondary | ICD-10-CM | POA: Diagnosis present

## 2015-05-23 DIAGNOSIS — G4733 Obstructive sleep apnea (adult) (pediatric): Secondary | ICD-10-CM | POA: Diagnosis not present

## 2015-05-23 DIAGNOSIS — M810 Age-related osteoporosis without current pathological fracture: Secondary | ICD-10-CM | POA: Diagnosis present

## 2015-05-23 DIAGNOSIS — M1990 Unspecified osteoarthritis, unspecified site: Secondary | ICD-10-CM | POA: Diagnosis present

## 2015-05-23 DIAGNOSIS — I679 Cerebrovascular disease, unspecified: Secondary | ICD-10-CM | POA: Diagnosis present

## 2015-05-23 DIAGNOSIS — E1151 Type 2 diabetes mellitus with diabetic peripheral angiopathy without gangrene: Secondary | ICD-10-CM | POA: Diagnosis present

## 2015-05-23 HISTORY — PX: CORONARY ARTERY BYPASS GRAFT: SHX141

## 2015-05-23 HISTORY — PX: TEE WITHOUT CARDIOVERSION: SHX5443

## 2015-05-23 LAB — CBC
HCT: 32.2 % — ABNORMAL LOW (ref 39.0–52.0)
HCT: 28.3 % — ABNORMAL LOW (ref 39.0–52.0)
Hemoglobin: 10.9 g/dL — ABNORMAL LOW (ref 13.0–17.0)
Hemoglobin: 9.4 g/dL — ABNORMAL LOW (ref 13.0–17.0)
MCH: 32.5 pg (ref 26.0–34.0)
MCH: 32 pg (ref 26.0–34.0)
MCHC: 33.9 g/dL (ref 30.0–36.0)
MCHC: 33.2 g/dL (ref 30.0–36.0)
MCV: 96.1 fL (ref 78.0–100.0)
MCV: 96.3 fL (ref 78.0–100.0)
Platelets: 152 10*3/uL (ref 150–400)
Platelets: 158 10*3/uL (ref 150–400)
RBC: 3.35 MIL/uL — ABNORMAL LOW (ref 4.22–5.81)
RBC: 2.94 MIL/uL — ABNORMAL LOW (ref 4.22–5.81)
RDW: 13 % (ref 11.5–15.5)
RDW: 13.1 % (ref 11.5–15.5)
WBC: 10.3 10*3/uL (ref 4.0–10.5)
WBC: 8.5 10*3/uL (ref 4.0–10.5)

## 2015-05-23 LAB — POCT I-STAT, CHEM 8
BUN: 16 mg/dL (ref 6–20)
BUN: 14 mg/dL (ref 6–20)
BUN: 15 mg/dL (ref 6–20)
BUN: 14 mg/dL (ref 6–20)
BUN: 15 mg/dL (ref 6–20)
BUN: 15 mg/dL (ref 6–20)
Calcium, Ion: 1.31 mmol/L — ABNORMAL HIGH (ref 1.13–1.30)
Calcium, Ion: 1.08 mmol/L — ABNORMAL LOW (ref 1.13–1.30)
Calcium, Ion: 1.27 mmol/L (ref 1.13–1.30)
Calcium, Ion: 1.14 mmol/L (ref 1.13–1.30)
Calcium, Ion: 1.16 mmol/L (ref 1.13–1.30)
Calcium, Ion: 1.26 mmol/L (ref 1.13–1.30)
Chloride: 103 mmol/L (ref 101–111)
Chloride: 104 mmol/L (ref 101–111)
Chloride: 99 mmol/L — ABNORMAL LOW (ref 101–111)
Chloride: 102 mmol/L (ref 101–111)
Chloride: 99 mmol/L — ABNORMAL LOW (ref 101–111)
Chloride: 105 mmol/L (ref 101–111)
Creatinine, Ser: 1.4 mg/dL — ABNORMAL HIGH (ref 0.61–1.24)
Creatinine, Ser: 1.2 mg/dL (ref 0.61–1.24)
Creatinine, Ser: 1.3 mg/dL — ABNORMAL HIGH (ref 0.61–1.24)
Creatinine, Ser: 1.2 mg/dL (ref 0.61–1.24)
Creatinine, Ser: 1.3 mg/dL — ABNORMAL HIGH (ref 0.61–1.24)
Creatinine, Ser: 1.3 mg/dL — ABNORMAL HIGH (ref 0.61–1.24)
Glucose, Bld: 144 mg/dL — ABNORMAL HIGH (ref 65–99)
Glucose, Bld: 130 mg/dL — ABNORMAL HIGH (ref 65–99)
Glucose, Bld: 160 mg/dL — ABNORMAL HIGH (ref 65–99)
Glucose, Bld: 165 mg/dL — ABNORMAL HIGH (ref 65–99)
Glucose, Bld: 180 mg/dL — ABNORMAL HIGH (ref 65–99)
Glucose, Bld: 194 mg/dL — ABNORMAL HIGH (ref 65–99)
HCT: 38 % — ABNORMAL LOW (ref 39.0–52.0)
HCT: 26 % — ABNORMAL LOW (ref 39.0–52.0)
HCT: 34 % — ABNORMAL LOW (ref 39.0–52.0)
HCT: 27 % — ABNORMAL LOW (ref 39.0–52.0)
HCT: 27 % — ABNORMAL LOW (ref 39.0–52.0)
HCT: 35 % — ABNORMAL LOW (ref 39.0–52.0)
Hemoglobin: 12.9 g/dL — ABNORMAL LOW (ref 13.0–17.0)
Hemoglobin: 11.6 g/dL — ABNORMAL LOW (ref 13.0–17.0)
Hemoglobin: 8.8 g/dL — ABNORMAL LOW (ref 13.0–17.0)
Hemoglobin: 9.2 g/dL — ABNORMAL LOW (ref 13.0–17.0)
Hemoglobin: 9.2 g/dL — ABNORMAL LOW (ref 13.0–17.0)
Hemoglobin: 11.9 g/dL — ABNORMAL LOW (ref 13.0–17.0)
Potassium: 4.2 mmol/L (ref 3.5–5.1)
Potassium: 3.8 mmol/L (ref 3.5–5.1)
Potassium: 4.1 mmol/L (ref 3.5–5.1)
Potassium: 4.5 mmol/L (ref 3.5–5.1)
Potassium: 3.8 mmol/L (ref 3.5–5.1)
Potassium: 4.7 mmol/L (ref 3.5–5.1)
Sodium: 138 mmol/L (ref 135–145)
Sodium: 137 mmol/L (ref 135–145)
Sodium: 139 mmol/L (ref 135–145)
Sodium: 137 mmol/L (ref 135–145)
Sodium: 137 mmol/L (ref 135–145)
Sodium: 138 mmol/L (ref 135–145)
TCO2: 25 mmol/L (ref 0–100)
TCO2: 24 mmol/L (ref 0–100)
TCO2: 24 mmol/L (ref 0–100)
TCO2: 25 mmol/L (ref 0–100)
TCO2: 23 mmol/L (ref 0–100)
TCO2: 21 mmol/L (ref 0–100)

## 2015-05-23 LAB — CREATININE, SERUM
Creatinine, Ser: 1.48 mg/dL — ABNORMAL HIGH (ref 0.61–1.24)
GFR calc Af Amer: 53 mL/min — ABNORMAL LOW (ref >60)
GFR calc non Af Amer: 46 mL/min — ABNORMAL LOW (ref >60)

## 2015-05-23 LAB — HEMOGLOBIN AND HEMATOCRIT, BLOOD
HCT: 27.6 % — ABNORMAL LOW (ref 39.0–52.0)
Hemoglobin: 9.4 g/dL — ABNORMAL LOW (ref 13.0–17.0)

## 2015-05-23 LAB — POCT I-STAT 3, ART BLOOD GAS (G3+)
Acid-base deficit: 3 mmol/L — ABNORMAL HIGH (ref 0.0–2.0)
Acid-base deficit: 4 mmol/L — ABNORMAL HIGH (ref 0.0–2.0)
Acid-base deficit: 4 mmol/L — ABNORMAL HIGH (ref 0.0–2.0)
Bicarbonate: 24.9 mEq/L — ABNORMAL HIGH (ref 20.0–24.0)
Bicarbonate: 23.8 mEq/L (ref 20.0–24.0)
Bicarbonate: 22.5 mEq/L (ref 20.0–24.0)
Bicarbonate: 22.6 mEq/L (ref 20.0–24.0)
O2 Saturation: 100 %
O2 Saturation: 98 %
O2 Saturation: 98 %
O2 Saturation: 93 %
Patient temperature: 36.4
Patient temperature: 37.4
Patient temperature: 37
TCO2: 26 mmol/L (ref 0–100)
TCO2: 25 mmol/L (ref 0–100)
TCO2: 24 mmol/L (ref 0–100)
TCO2: 24 mmol/L (ref 0–100)
pCO2 arterial: 40.2 mmHg (ref 35.0–45.0)
pCO2 arterial: 49.5 mmHg — ABNORMAL HIGH (ref 35.0–45.0)
pCO2 arterial: 48.8 mmHg — ABNORMAL HIGH (ref 35.0–45.0)
pCO2 arterial: 44.9 mmHg (ref 35.0–45.0)
pH, Arterial: 7.401 (ref 7.350–7.450)
pH, Arterial: 7.287 — ABNORMAL LOW (ref 7.350–7.450)
pH, Arterial: 7.273 — ABNORMAL LOW (ref 7.350–7.450)
pH, Arterial: 7.309 — ABNORMAL LOW (ref 7.350–7.450)
pO2, Arterial: 281 mmHg — ABNORMAL HIGH (ref 80.0–100.0)
pO2, Arterial: 116 mmHg — ABNORMAL HIGH (ref 80.0–100.0)
pO2, Arterial: 132 mmHg — ABNORMAL HIGH (ref 80.0–100.0)
pO2, Arterial: 73 mmHg — ABNORMAL LOW (ref 80.0–100.0)

## 2015-05-23 LAB — GLUCOSE, CAPILLARY
Glucose-Capillary: 157 mg/dL — ABNORMAL HIGH (ref 65–99)
Glucose-Capillary: 100 mg/dL — ABNORMAL HIGH (ref 65–99)
Glucose-Capillary: 124 mg/dL — ABNORMAL HIGH (ref 65–99)
Glucose-Capillary: 62 mg/dL — ABNORMAL LOW (ref 65–99)
Glucose-Capillary: 105 mg/dL — ABNORMAL HIGH (ref 65–99)
Glucose-Capillary: 99 mg/dL (ref 65–99)
Glucose-Capillary: 136 mg/dL — ABNORMAL HIGH (ref 65–99)
Glucose-Capillary: 186 mg/dL — ABNORMAL HIGH (ref 65–99)
Glucose-Capillary: 193 mg/dL — ABNORMAL HIGH (ref 65–99)
Glucose-Capillary: 198 mg/dL — ABNORMAL HIGH (ref 65–99)

## 2015-05-23 LAB — PROTIME-INR
INR: 1.51 — ABNORMAL HIGH (ref 0.00–1.49)
Prothrombin Time: 18.3 seconds — ABNORMAL HIGH (ref 11.6–15.2)

## 2015-05-23 LAB — PLATELET COUNT: Platelets: 175 10*3/uL (ref 150–400)

## 2015-05-23 LAB — MAGNESIUM: Magnesium: 2.4 mg/dL (ref 1.7–2.4)

## 2015-05-23 LAB — POCT I-STAT 4, (NA,K, GLUC, HGB,HCT)
Glucose, Bld: 122 mg/dL — ABNORMAL HIGH (ref 65–99)
HCT: 31 % — ABNORMAL LOW (ref 39.0–52.0)
Hemoglobin: 10.5 g/dL — ABNORMAL LOW (ref 13.0–17.0)
Potassium: 3.6 mmol/L (ref 3.5–5.1)
Sodium: 140 mmol/L (ref 135–145)

## 2015-05-23 LAB — APTT: aPTT: 28 seconds (ref 24–37)

## 2015-05-23 SURGERY — CORONARY ARTERY BYPASS GRAFTING (CABG)
Anesthesia: General | Site: Chest

## 2015-05-23 MED ORDER — PHENYLEPHRINE HCL 10 MG/ML IJ SOLN
0.0000 ug/min | INTRAMUSCULAR | Status: DC
  Filled 2015-05-23: qty 2

## 2015-05-23 MED ORDER — MAGNESIUM SULFATE 4 GM/100ML IV SOLN
4.0000 g | Freq: Once | INTRAVENOUS | Status: AC
  Administered 2015-05-23: 4 g via INTRAVENOUS
  Filled 2015-05-23: qty 100

## 2015-05-23 MED ORDER — SODIUM CHLORIDE 0.9 % IV SOLN
INTRAVENOUS | Status: DC
  Administered 2015-05-23: 16:00:00 via INTRAVENOUS

## 2015-05-23 MED ORDER — DEXMEDETOMIDINE HCL IN NACL 200 MCG/50ML IV SOLN
0.0000 ug/kg/h | INTRAVENOUS | Status: DC
  Administered 2015-05-23: 0.6 ug/kg/h via INTRAVENOUS
  Filled 2015-05-23: qty 50

## 2015-05-23 MED ORDER — METOPROLOL TARTRATE 12.5 MG HALF TABLET
12.5000 mg | ORAL_TABLET | Freq: Two times a day (BID) | ORAL | Status: DC
  Administered 2015-05-24 (×2): 12.5 mg via ORAL
  Filled 2015-05-23 (×5): qty 1

## 2015-05-23 MED ORDER — MIDAZOLAM HCL 2 MG/2ML IJ SOLN
2.0000 mg | INTRAMUSCULAR | Status: DC | PRN
  Administered 2015-05-23: 2 mg via INTRAVENOUS
  Filled 2015-05-23: qty 2

## 2015-05-23 MED ORDER — METOPROLOL TARTRATE 25 MG/10 ML ORAL SUSPENSION
12.5000 mg | Freq: Two times a day (BID) | ORAL | Status: DC
  Filled 2015-05-23 (×5): qty 5

## 2015-05-23 MED ORDER — HEPARIN SODIUM (PORCINE) 1000 UNIT/ML IJ SOLN
INTRAMUSCULAR | Status: AC
  Filled 2015-05-23: qty 1

## 2015-05-23 MED ORDER — PHENYLEPHRINE HCL 10 MG/ML IJ SOLN
10.0000 mg | INTRAVENOUS | Status: DC | PRN
  Administered 2015-05-23 (×2): 30 ug/min via INTRAVENOUS

## 2015-05-23 MED ORDER — NEOSTIGMINE METHYLSULFATE 10 MG/10ML IV SOLN
INTRAVENOUS | Status: AC
  Filled 2015-05-23: qty 1

## 2015-05-23 MED ORDER — NITROGLYCERIN IN D5W 200-5 MCG/ML-% IV SOLN: 0.0000 ug/min | INTRAVENOUS | Status: DC

## 2015-05-23 MED ORDER — FENTANYL CITRATE (PF) 250 MCG/5ML IJ SOLN
INTRAMUSCULAR | Status: AC
  Filled 2015-05-23: qty 5

## 2015-05-23 MED ORDER — DOCUSATE SODIUM 100 MG PO CAPS
200.0000 mg | ORAL_CAPSULE | Freq: Every day | ORAL | Status: DC
  Administered 2015-05-24: 200 mg via ORAL
  Filled 2015-05-23: qty 2

## 2015-05-23 MED ORDER — TRAMADOL HCL 50 MG PO TABS
50.0000 mg | ORAL_TABLET | ORAL | Status: DC | PRN
  Administered 2015-05-24: 50 mg via ORAL
  Filled 2015-05-23: qty 1

## 2015-05-23 MED ORDER — LACTATED RINGERS IV SOLN: 500.0000 mL | Freq: Once | INTRAVENOUS | Status: DC | PRN

## 2015-05-23 MED ORDER — LACTATED RINGERS IV SOLN
INTRAVENOUS | Status: DC | PRN
  Administered 2015-05-23: 08:00:00 via INTRAVENOUS

## 2015-05-23 MED ORDER — ROCURONIUM BROMIDE 50 MG/5ML IV SOLN
INTRAVENOUS | Status: AC
  Filled 2015-05-23: qty 1

## 2015-05-23 MED ORDER — ONDANSETRON HCL 4 MG/2ML IJ SOLN
4.0000 mg | Freq: Four times a day (QID) | INTRAMUSCULAR | Status: DC | PRN
  Administered 2015-05-24 – 2015-05-25 (×3): 4 mg via INTRAVENOUS
  Filled 2015-05-23 (×4): qty 2

## 2015-05-23 MED ORDER — DEXTROSE 50 % IV SOLN
INTRAVENOUS | Status: AC
  Administered 2015-05-23: 50 mL
  Filled 2015-05-23: qty 50

## 2015-05-23 MED ORDER — FENTANYL CITRATE (PF) 100 MCG/2ML IJ SOLN
INTRAMUSCULAR | Status: DC | PRN
  Administered 2015-05-23: 150 ug via INTRAVENOUS
  Administered 2015-05-23: 50 ug via INTRAVENOUS
  Administered 2015-05-23: 150 ug via INTRAVENOUS
  Administered 2015-05-23: 100 ug via INTRAVENOUS
  Administered 2015-05-23: 150 ug via INTRAVENOUS
  Administered 2015-05-23: 100 ug via INTRAVENOUS
  Administered 2015-05-23: 150 ug via INTRAVENOUS
  Administered 2015-05-23: 100 ug via INTRAVENOUS
  Administered 2015-05-23: 150 ug via INTRAVENOUS
  Administered 2015-05-23: 100 ug via INTRAVENOUS
  Administered 2015-05-23: 50 ug via INTRAVENOUS

## 2015-05-23 MED ORDER — LEVOFLOXACIN IN D5W 750 MG/150ML IV SOLN
750.0000 mg | INTRAVENOUS | Status: AC
  Administered 2015-05-24: 750 mg via INTRAVENOUS
  Filled 2015-05-23: qty 150

## 2015-05-23 MED ORDER — NITROGLYCERIN 0.2 MG/ML ON CALL CATH LAB
INTRAVENOUS | Status: DC | PRN
  Administered 2015-05-23: 10 ug via INTRAVENOUS
  Administered 2015-05-23: 20 ug via INTRAVENOUS
  Administered 2015-05-23: 40 ug via INTRAVENOUS
  Administered 2015-05-23 (×2): 10 ug via INTRAVENOUS

## 2015-05-23 MED ORDER — SODIUM CHLORIDE 0.45 % IV SOLN
INTRAVENOUS | Status: DC | PRN
  Administered 2015-05-23: 16:00:00 via INTRAVENOUS

## 2015-05-23 MED ORDER — SODIUM CHLORIDE 0.9 % IJ SOLN: 3.0000 mL | INTRAMUSCULAR | Status: DC | PRN

## 2015-05-23 MED ORDER — FERROUS GLUCONATE 324 (38 FE) MG PO TABS
324.0000 mg | ORAL_TABLET | Freq: Two times a day (BID) | ORAL | Status: DC
  Administered 2015-05-25 – 2015-05-29 (×9): 324 mg via ORAL
  Filled 2015-05-23 (×10): qty 1

## 2015-05-23 MED ORDER — PROPOFOL 10 MG/ML IV BOLUS
INTRAVENOUS | Status: DC | PRN
  Administered 2015-05-23: 100 mg via INTRAVENOUS

## 2015-05-23 MED ORDER — HEPARIN SODIUM (PORCINE) 1000 UNIT/ML IJ SOLN
INTRAMUSCULAR | Status: DC | PRN
  Administered 2015-05-23: 35000 [IU] via INTRAVENOUS

## 2015-05-23 MED ORDER — LACTATED RINGERS IV SOLN
INTRAVENOUS | Status: DC | PRN
  Administered 2015-05-23: 09:00:00 via INTRAVENOUS

## 2015-05-23 MED ORDER — ANTISEPTIC ORAL RINSE SOLUTION (CORINZ)
7.0000 mL | Freq: Four times a day (QID) | OROMUCOSAL | Status: DC
  Administered 2015-05-24 – 2015-05-25 (×5): 7 mL via OROMUCOSAL

## 2015-05-23 MED ORDER — PROTAMINE SULFATE 10 MG/ML IV SOLN
INTRAVENOUS | Status: AC
  Filled 2015-05-23: qty 5

## 2015-05-23 MED ORDER — MORPHINE SULFATE (PF) 2 MG/ML IV SOLN
2.0000 mg | INTRAVENOUS | Status: DC | PRN
  Administered 2015-05-24 (×8): 4 mg via INTRAVENOUS
  Filled 2015-05-23 (×9): qty 2
  Filled 2015-05-23: qty 1
  Filled 2015-05-23: qty 2

## 2015-05-23 MED ORDER — ALBUMIN HUMAN 5 % IV SOLN
INTRAVENOUS | Status: DC | PRN
  Administered 2015-05-23: 13:00:00 via INTRAVENOUS

## 2015-05-23 MED ORDER — ASPIRIN EC 325 MG PO TBEC
325.0000 mg | DELAYED_RELEASE_TABLET | Freq: Every day | ORAL | Status: DC
  Administered 2015-05-24: 325 mg via ORAL
  Filled 2015-05-23 (×2): qty 1

## 2015-05-23 MED ORDER — ASPIRIN 81 MG PO CHEW: 324.0000 mg | CHEWABLE_TABLET | Freq: Every day | ORAL | Status: DC

## 2015-05-23 MED ORDER — SODIUM CHLORIDE 0.9 % IV SOLN: 250.0000 mL | INTRAVENOUS | Status: DC

## 2015-05-23 MED ORDER — HEMOSTATIC AGENTS (NO CHARGE) OPTIME
TOPICAL | Status: DC | PRN
  Administered 2015-05-23: 1 via TOPICAL

## 2015-05-23 MED ORDER — SUCCINYLCHOLINE CHLORIDE 20 MG/ML IJ SOLN
INTRAMUSCULAR | Status: DC | PRN
  Administered 2015-05-23: 120 mg via INTRAVENOUS

## 2015-05-23 MED ORDER — MIDAZOLAM HCL 10 MG/2ML IJ SOLN
INTRAMUSCULAR | Status: AC
  Filled 2015-05-23: qty 4

## 2015-05-23 MED ORDER — INSULIN REGULAR BOLUS VIA INFUSION
0.0000 [IU] | Freq: Three times a day (TID) | INTRAVENOUS | Status: DC
  Filled 2015-05-23: qty 10

## 2015-05-23 MED ORDER — EPHEDRINE SULFATE 50 MG/ML IJ SOLN
INTRAMUSCULAR | Status: AC
  Filled 2015-05-23: qty 1

## 2015-05-23 MED ORDER — SODIUM CHLORIDE 0.9 % IV SOLN
INTRAVENOUS | Status: DC | PRN
  Administered 2015-05-23: 14:00:00 via INTRAVENOUS

## 2015-05-23 MED ORDER — LACTATED RINGERS IV SOLN: INTRAVENOUS | Status: DC

## 2015-05-23 MED ORDER — GELATIN ABSORBABLE MT POWD
OROMUCOSAL | Status: DC | PRN
  Administered 2015-05-23 (×3): 4 mL via TOPICAL

## 2015-05-23 MED ORDER — ACETAMINOPHEN 500 MG PO TABS
1000.0000 mg | ORAL_TABLET | Freq: Four times a day (QID) | ORAL | Status: AC
  Administered 2015-05-24 – 2015-05-28 (×15): 1000 mg via ORAL
  Filled 2015-05-23 (×18): qty 2

## 2015-05-23 MED ORDER — BISACODYL 10 MG RE SUPP: 10.0000 mg | Freq: Every day | RECTAL | Status: DC

## 2015-05-23 MED ORDER — ROCURONIUM BROMIDE 100 MG/10ML IV SOLN
INTRAVENOUS | Status: DC | PRN
  Administered 2015-05-23 (×4): 50 mg via INTRAVENOUS

## 2015-05-23 MED ORDER — MORPHINE SULFATE (PF) 2 MG/ML IV SOLN
1.0000 mg | INTRAVENOUS | Status: AC | PRN
  Administered 2015-05-23: 4 mg via INTRAVENOUS
  Administered 2015-05-23 (×2): 2 mg via INTRAVENOUS
  Administered 2015-05-24: 4 mg via INTRAVENOUS
  Filled 2015-05-23: qty 2

## 2015-05-23 MED ORDER — SODIUM CHLORIDE 0.9 % IJ SOLN
3.0000 mL | Freq: Two times a day (BID) | INTRAMUSCULAR | Status: DC
  Administered 2015-05-24 – 2015-05-27 (×5): 3 mL via INTRAVENOUS

## 2015-05-23 MED ORDER — ALBUMIN HUMAN 5 % IV SOLN
250.0000 mL | INTRAVENOUS | Status: AC | PRN
  Administered 2015-05-23 (×3): 250 mL via INTRAVENOUS
  Filled 2015-05-23: qty 250

## 2015-05-23 MED ORDER — MIDAZOLAM HCL 5 MG/5ML IJ SOLN
INTRAMUSCULAR | Status: DC | PRN
  Administered 2015-05-23: 4 mg via INTRAVENOUS
  Administered 2015-05-23: 1 mg via INTRAVENOUS
  Administered 2015-05-23: 2 mg via INTRAVENOUS
  Administered 2015-05-23: 1 mg via INTRAVENOUS
  Administered 2015-05-23: 2 mg via INTRAVENOUS

## 2015-05-23 MED ORDER — PROPOFOL 10 MG/ML IV BOLUS
INTRAVENOUS | Status: AC
  Filled 2015-05-23: qty 20

## 2015-05-23 MED ORDER — ATORVASTATIN CALCIUM 40 MG PO TABS
40.0000 mg | ORAL_TABLET | Freq: Every day | ORAL | Status: DC
  Administered 2015-05-24 – 2015-05-29 (×6): 40 mg via ORAL
  Filled 2015-05-23 (×7): qty 1

## 2015-05-23 MED ORDER — METOPROLOL TARTRATE 1 MG/ML IV SOLN
2.5000 mg | INTRAVENOUS | Status: DC | PRN
  Administered 2015-05-23: 5 mg via INTRAVENOUS
  Administered 2015-05-25 – 2015-05-26 (×2): 2.5 mg via INTRAVENOUS
  Administered 2015-05-26: 5 mg via INTRAVENOUS
  Filled 2015-05-23 (×4): qty 5

## 2015-05-23 MED ORDER — ACETAMINOPHEN 160 MG/5ML PO SOLN: 1000.0000 mg | Freq: Four times a day (QID) | ORAL | Status: AC

## 2015-05-23 MED ORDER — PANTOPRAZOLE SODIUM 40 MG PO TBEC: 40.0000 mg | DELAYED_RELEASE_TABLET | Freq: Every day | ORAL | Status: DC

## 2015-05-23 MED ORDER — FAMOTIDINE IN NACL 20-0.9 MG/50ML-% IV SOLN
20.0000 mg | Freq: Two times a day (BID) | INTRAVENOUS | Status: AC
  Administered 2015-05-23 (×2): 20 mg via INTRAVENOUS
  Filled 2015-05-23: qty 50

## 2015-05-23 MED ORDER — ADULT MULTIVITAMIN W/MINERALS CH
1.0000 | ORAL_TABLET | Freq: Every day | ORAL | Status: DC
  Administered 2015-05-24 – 2015-05-29 (×6): 1 via ORAL
  Filled 2015-05-23 (×7): qty 1

## 2015-05-23 MED ORDER — ROCURONIUM BROMIDE 50 MG/5ML IV SOLN
INTRAVENOUS | Status: AC
  Filled 2015-05-23: qty 2

## 2015-05-23 MED ORDER — POTASSIUM CHLORIDE 10 MEQ/50ML IV SOLN
10.0000 meq | INTRAVENOUS | Status: AC
  Administered 2015-05-23 (×3): 10 meq via INTRAVENOUS

## 2015-05-23 MED ORDER — ARTIFICIAL TEARS OP OINT
TOPICAL_OINTMENT | OPHTHALMIC | Status: DC | PRN
  Administered 2015-05-23: 1 via OPHTHALMIC

## 2015-05-23 MED ORDER — VANCOMYCIN HCL IN DEXTROSE 1-5 GM/200ML-% IV SOLN
1000.0000 mg | Freq: Once | INTRAVENOUS | Status: AC
  Administered 2015-05-23: 1000 mg via INTRAVENOUS
  Filled 2015-05-23: qty 200

## 2015-05-23 MED ORDER — BISACODYL 5 MG PO TBEC
10.0000 mg | DELAYED_RELEASE_TABLET | Freq: Every day | ORAL | Status: DC
  Administered 2015-05-24 – 2015-05-25 (×2): 10 mg via ORAL
  Filled 2015-05-23 (×2): qty 2

## 2015-05-23 MED ORDER — ARTIFICIAL TEARS OP OINT
TOPICAL_OINTMENT | OPHTHALMIC | Status: AC
  Filled 2015-05-23: qty 3.5

## 2015-05-23 MED ORDER — PROTAMINE SULFATE 10 MG/ML IV SOLN
INTRAVENOUS | Status: AC
  Filled 2015-05-23: qty 25

## 2015-05-23 MED ORDER — METOPROLOL TARTRATE 1 MG/ML IV SOLN
INTRAVENOUS | Status: DC | PRN
  Administered 2015-05-23 (×2): 2.5 mg via INTRAVENOUS

## 2015-05-23 MED ORDER — PROTAMINE SULFATE 10 MG/ML IV SOLN
INTRAVENOUS | Status: DC | PRN
  Administered 2015-05-23 (×2): 50 mg via INTRAVENOUS
  Administered 2015-05-23: 40 mg via INTRAVENOUS
  Administered 2015-05-23: 20 mg via INTRAVENOUS
  Administered 2015-05-23: 30 mg via INTRAVENOUS
  Administered 2015-05-23: 110 mg via INTRAVENOUS

## 2015-05-23 MED ORDER — ACETAMINOPHEN 160 MG/5ML PO SOLN: 650.0000 mg | Freq: Once | ORAL | Status: AC

## 2015-05-23 MED ORDER — ACETAMINOPHEN 650 MG RE SUPP
650.0000 mg | Freq: Once | RECTAL | Status: AC
  Administered 2015-05-23: 650 mg via RECTAL

## 2015-05-23 MED ORDER — SODIUM CHLORIDE 0.9 % IV SOLN
INTRAVENOUS | Status: DC
  Administered 2015-05-23: 19:00:00 via INTRAVENOUS
  Filled 2015-05-23 (×2): qty 2.5

## 2015-05-23 MED ORDER — METOPROLOL TARTRATE 1 MG/ML IV SOLN
INTRAVENOUS | Status: AC
  Filled 2015-05-23: qty 5

## 2015-05-23 MED ORDER — SUCCINYLCHOLINE CHLORIDE 20 MG/ML IJ SOLN
INTRAMUSCULAR | Status: AC
  Filled 2015-05-23: qty 1

## 2015-05-23 MED ORDER — 0.9 % SODIUM CHLORIDE (POUR BTL) OPTIME
TOPICAL | Status: DC | PRN
  Administered 2015-05-23: 6000 mL

## 2015-05-23 MED ORDER — CHLORHEXIDINE GLUCONATE 0.12% ORAL RINSE (MEDLINE KIT)
15.0000 mL | Freq: Two times a day (BID) | OROMUCOSAL | Status: DC
  Administered 2015-05-23 – 2015-05-24 (×2): 15 mL via OROMUCOSAL

## 2015-05-23 MED ORDER — DOPAMINE-DEXTROSE 3.2-5 MG/ML-% IV SOLN: 0.0000 ug/kg/min | INTRAVENOUS | Status: DC

## 2015-05-23 MED ORDER — DEXTROSE 5 % IV SOLN
20.0000 mg | INTRAVENOUS | Status: DC | PRN
  Administered 2015-05-23: 30 ug/min via INTRAVENOUS

## 2015-05-23 MED FILL — Sodium Chloride IV Soln 0.9%: INTRAVENOUS | Qty: 2000 | Status: AC

## 2015-05-23 MED FILL — Heparin Sodium (Porcine) Inj 1000 Unit/ML: INTRAMUSCULAR | Qty: 10 | Status: AC

## 2015-05-23 MED FILL — Lidocaine HCl IV Inj 20 MG/ML: INTRAVENOUS | Qty: 5 | Status: AC

## 2015-05-23 MED FILL — Mannitol IV Soln 20%: INTRAVENOUS | Qty: 500 | Status: AC

## 2015-05-23 MED FILL — Sodium Bicarbonate IV Soln 8.4%: INTRAVENOUS | Qty: 50 | Status: AC

## 2015-05-23 MED FILL — Electrolyte-R (PH 7.4) Solution: INTRAVENOUS | Qty: 4000 | Status: AC

## 2015-05-23 SURGICAL SUPPLY — 88 items
BAG DECANTER FOR FLEXI CONT (MISCELLANEOUS) ×3 IMPLANT
BANDAGE ELASTIC 4 VELCRO ST LF (GAUZE/BANDAGES/DRESSINGS) ×3 IMPLANT
BANDAGE ELASTIC 6 VELCRO ST LF (GAUZE/BANDAGES/DRESSINGS) ×3 IMPLANT
BLADE STERNUM SYSTEM 6 (BLADE) ×3 IMPLANT
BLADE SURG 11 STRL SS (BLADE) ×3 IMPLANT
BNDG GAUZE ELAST 4 BULKY (GAUZE/BANDAGES/DRESSINGS) ×3 IMPLANT
CANISTER SUCTION 2500CC (MISCELLANEOUS) ×3 IMPLANT
CATH CPB KIT GERHARDT (MISCELLANEOUS) ×3 IMPLANT
CATH THORACIC 28FR (CATHETERS) ×3 IMPLANT
CRADLE DONUT ADULT HEAD (MISCELLANEOUS) ×3 IMPLANT
DRAIN CHANNEL 28F RND 3/8 FF (WOUND CARE) ×3 IMPLANT
DRAPE CARDIOVASCULAR INCISE (DRAPES) ×1
DRAPE SLUSH/WARMER DISC (DRAPES) ×3 IMPLANT
DRAPE SRG 135X102X78XABS (DRAPES) ×2 IMPLANT
DRSG AQUACEL AG ADV 3.5X14 (GAUZE/BANDAGES/DRESSINGS) ×3 IMPLANT
DRSG KUZMA FLUFF (GAUZE/BANDAGES/DRESSINGS) ×3 IMPLANT
ELECT BLADE 4.0 EZ CLEAN MEGAD (MISCELLANEOUS) ×3
ELECT REM PT RETURN 9FT ADLT (ELECTROSURGICAL) ×6
ELECTRODE BLDE 4.0 EZ CLN MEGD (MISCELLANEOUS) ×2 IMPLANT
ELECTRODE REM PT RTRN 9FT ADLT (ELECTROSURGICAL) ×4 IMPLANT
GAUZE SPONGE 4X4 12PLY STRL (GAUZE/BANDAGES/DRESSINGS) ×6 IMPLANT
GLOVE BIO SURGEON STRL SZ 6 (GLOVE) ×6 IMPLANT
GLOVE BIO SURGEON STRL SZ 6.5 (GLOVE) ×12 IMPLANT
GLOVE BIO SURGEON STRL SZ7 (GLOVE) ×6 IMPLANT
GLOVE BIOGEL PI IND STRL 6.5 (GLOVE) ×8 IMPLANT
GLOVE BIOGEL PI IND STRL 7.0 (GLOVE) ×4 IMPLANT
GLOVE BIOGEL PI INDICATOR 6.5 (GLOVE) ×4
GLOVE BIOGEL PI INDICATOR 7.0 (GLOVE) ×2
GOWN STRL REUS W/ TWL LRG LVL3 (GOWN DISPOSABLE) ×16 IMPLANT
GOWN STRL REUS W/TWL LRG LVL3 (GOWN DISPOSABLE) ×8
HEMOSTAT POWDER SURGIFOAM 1G (HEMOSTASIS) ×9 IMPLANT
HEMOSTAT SURGICEL 2X14 (HEMOSTASIS) ×3 IMPLANT
KIT BASIN OR (CUSTOM PROCEDURE TRAY) ×3 IMPLANT
KIT CATH SUCT 8FR (CATHETERS) ×3 IMPLANT
KIT ROOM TURNOVER OR (KITS) ×3 IMPLANT
KIT SUCTION CATH 14FR (SUCTIONS) ×6 IMPLANT
KIT VASOVIEW W/TROCAR VH 2000 (KITS) ×3 IMPLANT
LEAD PACING MYOCARDI (MISCELLANEOUS) ×3 IMPLANT
MARKER GRAFT CORONARY BYPASS (MISCELLANEOUS) ×9 IMPLANT
NS IRRIG 1000ML POUR BTL (IV SOLUTION) ×15 IMPLANT
PACK OPEN HEART (CUSTOM PROCEDURE TRAY) ×3 IMPLANT
PAD ARMBOARD 7.5X6 YLW CONV (MISCELLANEOUS) ×6 IMPLANT
PAD ELECT DEFIB RADIOL ZOLL (MISCELLANEOUS) ×3 IMPLANT
PENCIL BUTTON HOLSTER BLD 10FT (ELECTRODE) ×3 IMPLANT
PUNCH AORTIC ROT 4.0MM RCL 40 (MISCELLANEOUS) ×3 IMPLANT
SET CARDIOPLEGIA MPS 5001102 (MISCELLANEOUS) ×3 IMPLANT
SPONGE GAUZE 4X4 12PLY STER LF (GAUZE/BANDAGES/DRESSINGS) ×6 IMPLANT
SPONGE LAP 18X18 X RAY DECT (DISPOSABLE) ×6 IMPLANT
SUT BONE WAX W31G (SUTURE) ×3 IMPLANT
SUT ETHIBOND 2 0 SH (SUTURE) ×4
SUT ETHIBOND 2 0 SH 36X2 (SUTURE) ×8 IMPLANT
SUT MNCRL AB 4-0 PS2 18 (SUTURE) ×3 IMPLANT
SUT PROLENE 3 0 SH1 36 (SUTURE) ×9 IMPLANT
SUT PROLENE 4 0 RB 1 (SUTURE) ×2
SUT PROLENE 4 0 TF (SUTURE) ×6 IMPLANT
SUT PROLENE 4-0 RB1 .5 CRCL 36 (SUTURE) ×4 IMPLANT
SUT PROLENE 5 0 C 1 36 (SUTURE) ×6 IMPLANT
SUT PROLENE 6 0 C 1 30 (SUTURE) ×6 IMPLANT
SUT PROLENE 6 0 CC (SUTURE) ×6 IMPLANT
SUT PROLENE 7 0 BV1 MDA (SUTURE) ×3 IMPLANT
SUT PROLENE 8 0 BV175 6 (SUTURE) ×18 IMPLANT
SUT SILK  1 MH (SUTURE) ×3
SUT SILK 1 MH (SUTURE) ×6 IMPLANT
SUT SILK 1 TIES 10X30 (SUTURE) ×3 IMPLANT
SUT SILK 2 0 SH CR/8 (SUTURE) ×6 IMPLANT
SUT SILK 2 0 TIES 10X30 (SUTURE) ×3 IMPLANT
SUT SILK 2 0 TIES 17X18 (SUTURE) ×1
SUT SILK 2-0 18XBRD TIE BLK (SUTURE) ×2 IMPLANT
SUT SILK 3 0 SH CR/8 (SUTURE) ×3 IMPLANT
SUT SILK 4 0 TIE 10X30 (SUTURE) ×6 IMPLANT
SUT STEEL 6MS V (SUTURE) ×3 IMPLANT
SUT STEEL SZ 6 DBL 3X14 BALL (SUTURE) ×3 IMPLANT
SUT TEM PAC WIRE 2 0 SH (SUTURE) ×12 IMPLANT
SUT VIC AB 1 CTX 18 (SUTURE) ×6 IMPLANT
SUT VIC AB 2-0 CT1 27 (SUTURE) ×1
SUT VIC AB 2-0 CT1 TAPERPNT 27 (SUTURE) ×2 IMPLANT
SUT VIC AB 2-0 CTX 27 (SUTURE) ×6 IMPLANT
SUT VIC AB 3-0 X1 27 (SUTURE) ×6 IMPLANT
SUTURE E-PAK OPEN HEART (SUTURE) ×3 IMPLANT
SYSTEM SAHARA CHEST DRAIN ATS (WOUND CARE) ×3 IMPLANT
TAPE CLOTH SURG 6X10 WHT LF (GAUZE/BANDAGES/DRESSINGS) ×6 IMPLANT
TAPE PAPER 2X10 WHT MICROPORE (GAUZE/BANDAGES/DRESSINGS) ×3 IMPLANT
TOWEL OR 17X24 6PK STRL BLUE (TOWEL DISPOSABLE) ×6 IMPLANT
TOWEL OR 17X26 10 PK STRL BLUE (TOWEL DISPOSABLE) ×6 IMPLANT
TRAY FOLEY IC TEMP SENS 16FR (CATHETERS) ×3 IMPLANT
TUBING INSUFFLATION (TUBING) ×3 IMPLANT
UNDERPAD 30X30 INCONTINENT (UNDERPADS AND DIAPERS) ×3 IMPLANT
WATER STERILE IRR 1000ML POUR (IV SOLUTION) ×6 IMPLANT

## 2015-05-23 NOTE — Progress Notes (Signed)
  Echocardiogram 2D Echocardiogram has been performed.  Bradley [name] 05/23/2015, 9:07 AM

## 2015-05-23 NOTE — Progress Notes (Signed)
TCTS BRIEF SICU PROGRESS NOTE  Day of Surgery  S/P Procedure(s) (LRB): CORONARY ARTERY BYPASS GRAFTING (CABG) x 3 (LIMA to LAD, SVG to DIAGONAL 2, SVG to PDA) with Endoscopic Vein Harvesting from right greater saphenous vein (N/A) TRANSESOPHAGEAL ECHOCARDIOGRAM (TEE) (N/A)   Awake on vent.  Follows commands NSR w/ stable hemodynamics off all drips Chest tube output low UOP adequate Labs okay Mild respiratory acidosis on ABG  Plan: Routine postop.  Repeat ABG in 1 hour  Rexene Alberts, MD 05/23/2015 9:36 PM

## 2015-05-23 NOTE — Anesthesia Preprocedure Evaluation (Addendum)
Anesthesia Evaluation  Patient identified by MRN, date of birth, ID band Patient awake    Reviewed: Allergy & Precautions, NPO status , Patient's Chart, lab work & pertinent test results  Airway Mallampati: III  TM Distance: >3 FB Neck ROM: Full    Dental no notable dental hx. (+) Dental Advisory Given, Teeth Intact   Pulmonary shortness of breath and with exertion, sleep apnea and Continuous Positive Airway Pressure Ventilation , former smoker,    Pulmonary exam normal breath sounds clear to auscultation       Cardiovascular hypertension, Pt. on medications and Pt. on home beta blockers + CAD, + Past MI and + Peripheral Vascular Disease  Normal cardiovascular exam Rhythm:Regular Rate:Normal  Left ventricle: Global LV longitudinal strain was -15.2% The cavity size was normal. Systolic function was normal. The estimated ejection fraction was in the range of 55% to 60%. Wall motion was normal; there were no regional wall motion abnormalities. There was an increased relative contribution of atrial contraction to ventricular filling. Doppler parameters are consistent with abnormal left ventricular relaxation (grade 1 diastolic dysfunction). - Aortic valve: Moderate thickening and calcification, consistent with sclerosis. - Aorta: Aortic root dimension: 39 mm (ED). - Aortic root: The aortic root was mildly dilated. - Atrial septum: There was increased thickness of the septum, consistent with lipomatous hypertrophy.    Neuro/Psych CVA    GI/Hepatic hiatal hernia, GERD  ,  Endo/Other  diabetes, Type 2, Oral Hypoglycemic Agents  Renal/GU      Musculoskeletal  (+) Arthritis ,   Abdominal   Peds  Hematology   Anesthesia Other Findings   Reproductive/Obstetrics                          Anesthesia Physical Anesthesia Plan  ASA: IV  Anesthesia Plan: General   Post-op Pain  Management:    Induction: Intravenous  Airway Management Planned: Oral ETT  Additional Equipment: Arterial line, CVP, PA Cath, 3D TEE and Ultrasound Guidance Line Placement  Intra-op Plan:   Post-operative Plan: Post-operative intubation/ventilation  Informed Consent: I have reviewed the patients History and Physical, chart, labs and discussed the procedure including the risks, benefits and alternatives for the proposed anesthesia with the patient or authorized representative who has indicated his/her understanding and acceptance.   Dental advisory given  Plan Discussed with: CRNA, Anesthesiologist and Surgeon  Anesthesia Plan Comments:         Anesthesia Quick Evaluation

## 2015-05-23 NOTE — Anesthesia Procedure Notes (Addendum)
Anesthesia Procedure Note Risks, benefits, and alternatives discussed for central line placement. Patient agrees to procedure.  Patient positioned supine with mild trendelenburg.  Sterile prep and drape of right neck.  Full barrier precautions taken.  1% injectable lidocaine placed SQ for topical anesthesia.  Central line placed with ultrasound guidance.  Appropriate blood return from catheter.  No air.  Sutured into place.  Sterile dressing applied.  No complications. Patient tolerated the procedure well. Procedure Name: Intubation Date/Time: 05/23/2015 8:49 AM Performed by: Salli Quarry Cary Lothrop Pre-anesthesia Checklist: Patient identified, Emergency Drugs available, Suction available and Patient being monitored Patient Re-evaluated:Patient Re-evaluated prior to inductionOxygen Delivery Method: Circle system utilized Preoxygenation: Pre-oxygenation with 100% oxygen Intubation Type: IV induction Ventilation: Mask ventilation without difficulty and Oral airway inserted - appropriate to patient size Laryngoscope Size: Glidescope and 4 Grade View: Grade I Tube type: Oral Tube size: 8.5 mm Number of attempts: 1 Airway Equipment and Method: Video-laryngoscopy and Stylet Placement Confirmation: ETT inserted through vocal cords under direct vision,  positive ETCO2 and breath sounds checked- equal and bilateral Secured at: 23 cm Tube secured with: Tape Dental Injury: Teeth and Oropharynx as per pre-operative assessment    Anesthesia Procedure Note Risks, benefits, and alternatives discussed for central line placement. Patient agrees to procedure.  Patient positioned supine with mild trendelenburg.  Sterile prep and drape of right neck.  Full barrier precautions taken.  1% injectable lidocaine placed SQ for topical anesthesia.  Central line placed with ultrasound guidance.  Appropriate blood return from catheter.  No air.  Sutured into place.  Sterile dressing applied.  No complications. Patient  tolerated the procedure well.

## 2015-05-23 NOTE — Transfer of Care (Signed)
Immediate Anesthesia Transfer of Care Note  Patient: Bradley [name]  Procedure(s) Performed: Procedure(s): CORONARY ARTERY BYPASS GRAFTING (CABG) x 3 (LIMA to LAD, SVG to DIAGONAL 2, SVG to PDA) with Endoscopic Vein Harvesting from right greater saphenous vein (N/A) TRANSESOPHAGEAL ECHOCARDIOGRAM (TEE) (N/A)  Patient Location: SICU  Anesthesia Type:General  Level of Consciousness: sedated and Patient remains intubated per anesthesia plan  Airway & Oxygen Therapy: Patient remains intubated per anesthesia plan and Patient placed on Ventilator (see vital sign flow sheet for setting)  Post-op Assessment: Report given to RN and Post -op Vital signs reviewed and stable  Post vital signs: Reviewed and stable  Last Vitals:  Filed Vitals:   05/23/15 0649  BP: 146/84  Pulse: 99  Temp: 36.4 C  Resp: 20    Complications: No apparent anesthesia complications

## 2015-05-23 NOTE — OR Nursing (Signed)
Skin closure call to SICU at 1354.

## 2015-05-23 NOTE — OR Nursing (Signed)
Retractor out call to SICU at 1334.

## 2015-05-23 NOTE — Procedures (Signed)
Extubation Procedure Note  Patient Details:   Name: Bradley [name] DOB: 03-Sep-1945 MRN: [id number]   Airway Documentation:  Airway 8.5 mm (Active)  Secured at (cm) 23 cm 05/23/2015  7:31 PM  Measured From Lips 05/23/2015  7:31 PM  Secured Location Right 05/23/2015  7:31 PM  Secured By Pink Tape 05/23/2015  7:31 PM  Site Condition Dry 05/23/2015  7:31 PM    Evaluation  O2 sats: stable throughout Complications: No apparent complications Patient did tolerate procedure well. Bilateral Breath Sounds: Clear, Diminished Suctioning: Airway Yes   Patient extubated to 4L [place] with O2 sat of 98%. Patient performed 51ml on IS. RT will continue to monitor.  Patsy Baltimore Burnetta Kohls 05/23/2015, 10:25 PM

## 2015-05-23 NOTE — OR Nursing (Signed)
On-way call to SICU at 1418.

## 2015-05-23 NOTE — Brief Op Note (Signed)
05/23/2015  12:22 PM  PATIENT:  Bradley [name]  69 y.o. male  PRE-OPERATIVE DIAGNOSIS:  CAD  POST-OPERATIVE DIAGNOSIS:  CAD  PROCEDURE: MEDIAN STERNOTOMY for CORONARY ARTERY BYPASS GRAFTING (CABG) x 3 (LIMA to LAD, SVG to DIAGONAL 2, SVG to PDA) with EVH from right greater saphenous vein.   SURGEON:  Surgeon(s) and Role:    * Grace Isaac, MD - Primary  PHYSICIAN ASSISTANT: Lars Pinks PA-C  ANESTHESIA:   general  EBL:  Total I/O In: -  Out: 480 [Urine:480]  DRAINS: Chest tubes placed in the mediastinal and pleural spaces   COUNTS CORRECT:  YES  DICTATION: .Dragon Dictation  PLAN OF CARE: Admit to inpatient   PATIENT DISPOSITION:  ICU - intubated and hemodynamically stable.   Delay start of Pharmacological VTE agent (>24hrs) due to surgical blood loss or risk of bleeding: yes  BASELINE WEIGHT: 95 kg

## 2015-05-23 NOTE — Progress Notes (Signed)
Patient performed -30 NIF and 883ml FVC.

## 2015-05-23 NOTE — OR Nursing (Signed)
Off Pump call to SICU at 1310.

## 2015-05-23 NOTE — H&P (Signed)
[address]       [place], [address]             [phone]        [name] [place] [facility] Medical Record [id number] Date of Birth: 1945/09/22  Referring: Dr Tamala Julian Primary Care:  Melinda Crutch, MD  Chief Complaint:   Abnormal stress test  History of Present Illness:    The patient is a 69 year old male with multiple medical comorbidities including history of CVA, hypertension, type 2 diabetes mellitus, hyperlipidemia, obstructive sleep apnea, right bundle branch block. He also has a history of a extensive peripheral arterial disease with claudication as well as extracranial cerebrovascular occlusive disease s/p L CEA in 1994 with redo in 2001. He has a history of an aortoiliac occlusive disease s/p aorto Bifem bypass in 1998. He was recently referred to cardiology for evaluation of coronary artery disease. A nuclear myocardial perfusion study was positive intermediate risk with EF 52%.  He primarily has symptoms of shortness of breath and not angina. Cardiac cath was done . These lesions are felt to be complex for PCI and therefore high risk. We are asked to evaluate for surgical opinion for coronary artery revascularization.`    Current Activity/ Functional Status: Patient is independent with mobility/ambulation, transfers, ADL's, IADL's.   Zubrod Score: At the time of surgery this patient's most appropriate activity status/level should be described as: []     0    Normal activity, no symptoms [x]     1    Restricted in physical strenuous activity but ambulatory, able to do out light work []     2    Ambulatory and capable of self care, unable to do work activities, up and about                 more than 50%  Of the time                            []     3    Only limited self care, in bed greater than 50% of waking hours []     4    Completely disabled, no self care, confined to bed or chair []     5    Moribund  Past Medical History  Diagnosis Date  .  Hyperlipidemia   . Hypertension   . Diabetes mellitus age 49  . Peripheral vascular disease ([place])   . Arthritis   . Reflux esophagitis   . Stroke [facility]) 1987    Right brain stroke  . Neuropathy ([place]) 2013  . Osteoporosis 2013  . Sleep apnea     uses CPAP  . GERD (gastroesophageal reflux disease)   . H/O hiatal hernia   . Irritable bowel syndrome (IBS)   . Myocardial infarction [facility])     silent inferior MI; patient denies MI history (03/17/13)   . Anemia   . Carotid artery occlusion   . Coronary artery disease     cath 05/17/2015 90% mid LAD, 80% ost D2, 75% mid RCA, 35% prox RCA. CT surgery consulted  . Shortness of breath dyspnea   . Diverticulitis   . Wears glasses   Extensive diverticulosis Right ICA stenosis 50-69% per duplex   Past Surgical History  Procedure Laterality Date  . Carotid endarterectomy  1994 & redo 2001    Left  . Pr vein bypass graft,aorto-fem-pop  1998  . Iliac artery stent    .  Fracture surgery  2013    Right   foo  t X's 2  . Tonsillectomy    . Posterior lumbar fusion  Aug. 14, 2014    Level 1  . Spine surgery  2014  . Cardiac catheterization N/A 05/17/2015    Procedure: Left Heart Cath and Coronary Angiography;  Surgeon: Belva Crome, MD;  Location: [facility];  Service: Cardiovascular;  Laterality: N/A;  . Colonoscopy w/ biopsies and polypectomy      History  Smoking status  . Former Smoker -- 1.00 packs/day for 40 years  . Types: Cigarettes  . Quit date: 03/26/2009  Smokeless tobacco  . Never Used    History  Alcohol Use No    Social History   Social History  . Marital Status: Married    Spouse Name: N/A  . Number of Children: N/A  . Years of Education: N/A   Occupational History  . Not on file.   Social History Main Topics  . Smoking status: Former Smoker -- 1.00 packs/day for 40 years    Types: Cigarettes    Quit date: 03/26/2009  . Smokeless tobacco: Never Used  . Alcohol Use: No  . Drug Use: No  . Sexual  Activity: Not on file   Other Topics Concern  . Not on file   Social History Narrative    Allergies  Allergen Reactions  . Dilaudid Cough Nausea Only and Cough  . Food     "garlic, tomatoes, acid foods"= mouth blisters  . Hydrocodone Nausea Only  . Oxycodone Nausea Only  . Ultram [Tramadol Hcl] Nausea Only  . Penicillins Rash  . Sulfa Drugs Cross Reactors Rash    Current Facility-Administered Medications  Medication Dose Route Frequency Provider Last Rate Last Dose  . aminocaproic acid (AMICAR) 10 g in sodium chloride 0.9 % 100 mL infusion   Intravenous To OR Grace Isaac, MD      . chlorhexidine (HIBICLENS) 4 % liquid 2 application  30 mL Topical UD Grace Isaac, MD      . chlorhexidine (PERIDEX) 0.12 % solution 15 mL  15 mL Mouth/Throat Once Grace Isaac, MD      . dexmedetomidine (PRECEDEX) 400 MCG/100ML (4 mcg/mL) infusion  0.1-0.7 mcg/kg/hr Intravenous To OR Grace Isaac, MD      . DOPamine (INTROPIN) 800 mg in dextrose 5 % 250 mL (3.2 mg/mL) infusion  0-10 mcg/kg/min Intravenous To OR Grace Isaac, MD      . EPINEPHrine (ADRENALIN) 4 mg in dextrose 5 % 250 mL (0.016 mg/mL) infusion  0-10 mcg/min Intravenous To OR Grace Isaac, MD      . heparin 2,500 Units, papaverine 30 mg in electrolyte-148 (PLASMALYTE-148) 500 mL irrigation   Irrigation To OR Grace Isaac, MD      . heparin 30,000 units/NS 1000 mL solution for CELLSAVER   Other To OR Grace Isaac, MD      . insulin regular (NOVOLIN R,HUMULIN R) 250 Units in sodium chloride 0.9 % 250 mL (1 Units/mL) infusion   Intravenous To OR Grace Isaac, MD      . levofloxacin (LEVAQUIN) IVPB 500 mg  500 mg Intravenous To OR Grace Isaac, MD      . magnesium sulfate (IV Push/IM) injection 40 mEq  40 mEq Other To OR Grace Isaac, MD      . metoprolol tartrate (LOPRESSOR) tablet 12.5 mg  12.5 mg Oral Once Grace Isaac,  MD      . nitroGLYCERIN 50 mg in dextrose 5 % 250 mL (0.2  mg/mL) infusion  2-200 mcg/min Intravenous To OR Grace Isaac, MD      . phenylephrine (NEO-SYNEPHRINE) 20 mg in dextrose 5 % 250 mL (0.08 mg/mL) infusion  30-200 mcg/min Intravenous To OR Grace Isaac, MD      . potassium chloride injection 80 mEq  80 mEq Other To OR Grace Isaac, MD      . vancomycin (VANCOCIN) 1,500 mg in sodium chloride 0.9 % 250 mL IVPB  1,500 mg Intravenous To OR Grace Isaac, MD        Prescriptions prior to admission  Medication Sig Dispense Refill Last Dose  . Ascorbic Acid (VITAMIN C) 100 MG tablet Take 100 mg by mouth daily.   05/22/2015 at Unknown time  . aspirin 81 MG tablet Take 81 mg by mouth daily.     05/22/2015 at Unknown time  . atorvastatin (LIPITOR) 40 MG tablet Take 40 mg by mouth daily.     05/22/2015 at Unknown time  . Calcium Carbonate-Vitamin D (CALCIUM-D) 600-400 MG-UNIT TABS Take 1 tablet by mouth daily.    05/22/2015 at Unknown time  . carvedilol (COREG) 3.125 MG tablet Take 3.125 mg by mouth 2 (two) times daily with a meal.   05/23/2015 at 0530  . Cholecalciferol (D-3-5) 5000 UNITS capsule Take 5,000 Units by mouth daily.   05/22/2015 at Unknown time  . dexlansoprazole (DEXILANT) 60 MG capsule Take 60 mg by mouth daily.   05/23/2015 at 0530  . glimepiride (AMARYL) 2 MG tablet Take 2 mg by mouth daily before breakfast.     05/22/2015 at Unknown time  . IRON, FERROUS GLUCONATE, PO Take 40 mg by mouth 2 (two) times daily.   05/22/2015 at Unknown time  . Liraglutide (VICTOZA [place]) Inject 1.8 mg into the skin daily.    05/22/2015 at Unknown time  . losartan (COZAAR) 100 MG tablet Take 100 mg by mouth Daily.   05/22/2015 at Unknown time  . metFORMIN (GLUCOPHAGE-XR) 500 MG 24 hr tablet Take 1,000 mg by mouth 2 (two) times daily.    Past Week at Unknown time  . Multiple Vitamin (MULTIVITAMIN) capsule Take 1 capsule by mouth daily.     05/22/2015 at Unknown time  . nitroGLYCERIN (NITROSTAT) 0.4 MG SL tablet Place 1 tablet (0.4 mg total)  under the tongue every 5 (five) minutes as needed. 25 tablet 3   . vitamin E 400 UNIT capsule Take 400 Units by mouth daily.     05/22/2015 at Unknown time    Family History  Problem Relation Age of Onset  . Cancer Mother 12    pancreatic  . Hyperlipidemia Mother   . Stroke Father 48  . Deep vein thrombosis Father   . Hyperlipidemia Father   . Hypertension Father      Review of Systems:  Pertinent items are noted in HPI.     Cardiac Review of Systems: Y or N  Chest Pain [ n   ]  Resting SOB [  n ] Exertional SOB  [ + ]  Orthopnea [ n ]   Pedal Edema [ n]    Palpitations [  n] Syncope  [n  ]   Presyncope [ n  ]  General Review of Systems: [Y] = yes [  ]=no Constitional: recent weight change [  ]; anorexia [  ]; fatigue [ + ]; nausea [  +];  night sweats [  ]; fever [  ]; or chills [  ]                                                               Dental: poor dentition[ n ]; Last Dentist visit:   Eye : blurred vision [  ]; diplopia [   ]; vision changes [  ];  Amaurosis fugax[  ]; Resp: cough [  ];  wheezing[  ];  hemoptysis[  ]; shortness of breath[ +  ]; paroxysmal nocturnal dyspnea[  ]; dyspnea on exertion[  ]; or orthopnea[  ];  GI:  gallstones[  ], vomiting[  ];  dysphagia[  ]; melena[  ];  hematochezia [  ]; heartburn[  ];   Hx of  Colonoscopy[  ]; GU: kidney stones [  ]; hematuria[  ];   dysuria [  ];  nocturia[  ];  history of     obstruction [  ]; urinary frequency [  ]             Skin: rash, swelling[  ];, hair loss[  ];  peripheral edema[  ];  or itching[  ]; Musculosketetal: myalgias[  ];  joint swelling[  ];  joint erythema[  ];  joint pain[  ];  back pain[  +];  Heme/Lymph: bruising[  ];  bleeding[  ];  anemia[  ];  Neuro: TIA[ y ];  headaches[  ];  stroke[y  ];  vertigo[  ];  seizures[ n ];   paresthesias[  ];  difficulty walking[  ];some difficulty with balance at times  Psych:depression[  ]; anxiety[  ];  Endocrine: diabetes[30 years   ];  thyroid dysfunction[   ];  Immunizations: Flu [  ]; Pneumococcal[  ];  Other:  Physical Exam: BP 146/84 mmHg  Pulse 99  Temp(Src) 97.6 F (36.4 C) (Oral)  Resp 20  Wt 210 lb (95.255 kg)  SpO2 97%   General appearance: alert, cooperative and no distress Head: Normocephalic, without obvious abnormality, atraumatic Neck: no adenopathy, no carotid bruit, no JVD, supple, symmetrical, trachea midline and thyroid not enlarged, symmetric, no tenderness/mass/nodules Lymph nodes: Cervical, supraclavicular, and axillary nodes normal. Resp: clear to auscultation bilaterally Back: symmetric, no curvature. ROM normal. No CVA tenderness. Cardio: regular rate and rhythm, S1, S2 normal, no murmur, click, rub or gallop GI: soft, non-tender; bowel sounds normal; no masses,  no organomegaly Extremities: extremities normal, atraumatic, no cyanosis or edema Neurologic: Grossly normal periph pulses: absent DP/PT bilaterial  Diagnostic Studies & Laboratory data:     Recent Radiology Findings:  Dg Chest 2 View  05/11/2015  CLINICAL DATA:  Pre procedure respiratory exam. For heart catheterization on 05/15/2015. Abnormal stress test. EXAM: CHEST  2 VIEW COMPARISON:  Chest x-rays dated 03/16/2013 and 07/30/2011 FINDINGS: Heart size and pulmonary vascularity are normal. No infiltrates or effusions. Chronic peribronchial thickening. No acute osseous abnormality. Calcification in the thoracic aorta. Surgical clips in the left side of the neck. IMPRESSION: No acute abnormalities. Chronic bronchitic changes. Aortic atherosclerosis. Electronically Signed   By: Lorriane Shire M.D.   On: 05/11/2015 14:40   Ct Abdomen Pelvis W Contrast  05/09/2015  CLINICAL DATA:  69 year old male with left-sided abdominal pain for the past 3 weeks. Nausea. History of  diverticulitis. EXAM: CT ABDOMEN AND PELVIS WITH CONTRAST TECHNIQUE: Multidetector CT imaging of the abdomen and pelvis was performed using the standard protocol following bolus administration  of intravenous contrast. CONTRAST:  130mL ISOVUE-300 IOPAMIDOL (ISOVUE-300) INJECTION 61% COMPARISON:  CT the abdomen and pelvis 12/12/2013. FINDINGS: Lower chest: Atherosclerotic calcifications in the distal right coronary artery. Hepatobiliary: No cystic or solid hepatic lesions. No intra or extrahepatic biliary ductal dilatation. High attenuation material layering dependently in the gallbladder, compatible with small gallstones. No evidence of acute cholecystitis at this time. Pancreas: No pancreatic mass. No pancreatic ductal dilatation. No pancreatic or peripancreatic fluid or inflammatory changes. Spleen: Unremarkable. Adrenals/Urinary Tract: Bilateral adrenal glands are normal in appearance. Two calcifications are present within the left renal collecting system, largest of which measures 8 mm in the lower pole. 5.9 cm exophytic low-attenuation lesion in the lower pole of the right kidney is compatible with a cyst. Multiple clustered small low-attenuation lesions in the lower pole of the left kidney, largest of which measures 2 cm in diameter and is compatible with a simple cysts. The smaller lesions in this cluster are too small to definitively characterize, however, this area overall appears similar to the prior study, presumably multiple small cysts. Cortical thinning in the lower pole of the left kidney, compatible with chronic scarring. No hydroureteronephrosis to suggest urinary tract obstruction at this time. Urinary bladder is normal in appearance. Stomach/Bowel: Normal appearance of the stomach. No pathologic dilatation of small bowel or colon. Scattered colonic diverticulae are noted, most evident in the sigmoid colon, without surrounding inflammatory changes to suggest an acute diverticulitis at this time. Normal appendix. Vascular/Lymphatic: Extensive atherosclerosis throughout the abdominal and pelvic vasculature, without evidence of aneurysm or dissection. Aortobifemoral bypass graft noted,  widely patent at this time. The native infrarenal abdominal aorta and proximal common iliac arteries are occluded bilaterally. The left common iliac artery is distally reconstituted, presumably secondary to collateral flow. Bilateral external iliac arteries demonstrate some limited flow, also presumably from collateral flow. High-grade stenosis versus complete occlusion of the left superficial femoral artery. Probable high-grade stenosis of the right superficial femoral artery is well. Celiac axis and superior mesenteric artery are both widely patent. Inferior mesenteric artery branches appear patent, however, the origin of the vessel is presumably occluded (arising off the occluded native infrarenal abdominal aorta). No lymphadenopathy noted in the abdomen or pelvis. Reproductive: Prostate gland and seminal vesicles are unremarkable in appearance. Other: No significant volume of ascites.  No pneumoperitoneum. Musculoskeletal: Status post PLIF at L5-S1. There are no aggressive appearing lytic or blastic lesions noted in the visualized portions of the skeleton. IMPRESSION: 1. No acute findings in the abdomen or pelvis to account for the patient's symptoms. 2. Extensive colonic diverticulosis, without evidence to suggest acute diverticulitis at this time. 3. Extensive atherosclerosis status post aortobifemoral bypass graft placement, with multiple vascular findings (most notable for complete occlusion or high-grade stenosis of the left superficial femoral artery, and high-grade stenosis of the proximal right superficial femoral artery), as detailed above. 4. Normal appendix. 5. Additional incidental findings, as above. Electronically Signed   By: Vinnie Langton M.D.   On: 05/09/2015 16:19    I have independently reviewed the above radiologic studies.  Recent Lab Findings: Lab Results  Component Value Date   WBC 8.7 05/21/2015   HGB 13.6 05/21/2015   HCT 40.7 05/21/2015   PLT 263 05/21/2015   GLUCOSE 128*  05/21/2015   ALT 17 05/21/2015   AST 16 05/21/2015   NA  137 05/21/2015   K 4.6 05/21/2015   CL 106 05/21/2015   CREATININE 1.48* 05/21/2015   BUN 19 05/21/2015   CO2 21* 05/21/2015   INR 1.01 05/21/2015   HGBA1C 8.1* 05/21/2015     Procedures    Left Heart Cath and Coronary Angiography    Conclusion     Mid LAD lesion, 90% stenosed.  Ost 2nd Diag lesion, 80% stenosed.  Mid RCA lesion, 75% stenosed.  Prox RCA lesion, 35% stenosed.   Complex mid LAD, calcified 90+ percent stenosis prior to a trifurcation. The larger diagonal branch also has 80% stenosis.  75% mid RCA. RCA is a dominant vessel. The proximal RCA contains an eccentric region of calcification or dissection.  Widely patent circumflex  Overall normal LV function  The patient's clinical presentation is that of exertional dyspnea and not angina. In this prior smoker concerned that dyspnea may be more pulmonary than cardiac needs to be considered    Recommendations:   Evaluation by TCTS to consider whether CABG versus PCI would be best treatment option. PCI would be higher risk for ischemic complications and both territories.  Evaluation of dyspnea and will probably need a pulmonary consult.  Will hold Plavix for the time being.  Probable discharge in a.m. after decision making, reevaluation of kidney function, and further discussion with patient and family.                [facility]            [address], [place] [address]              [phone]  ------------------------------------------------------------------- Transthoracic Echocardiography  Patient:  Dmorea, Sparger MR #:    [id number] Study Date: 05/08/2015 Gender:   M Age:    69 Height:   180.3 cm Weight:   96.2 kg BSA:    2.22 m^2 Pt. Status: Room:  Ivar Bury, MD REFERRING  Belva Crome,  MD ATTENDING  Fransico Him, MD SONOGRAPHER Wyatt Mage, RDCS PERFORMING  [facility], Outpatient  cc:  ------------------------------------------------------------------- LV EF: 55% -  60%  ------------------------------------------------------------------- Indications:   Dyspnea (R06.00).  ------------------------------------------------------------------- History:  Risk factors: Carotid artery stenosis. PVD. RBBB. Hypertension. Diabetes mellitus. Dyslipidemia.  ------------------------------------------------------------------- Study Conclusions  - Left ventricle: Global LV longitudinal strain was -15.2% The cavity size was normal. Systolic function was normal. The estimated ejection fraction was in the range of 55% to 60%. Wall motion was normal; there were no regional wall motion abnormalities. There was an increased relative contribution of atrial contraction to ventricular filling. Doppler parameters are consistent with abnormal left ventricular relaxation (grade 1 diastolic dysfunction). - Aortic valve: Moderate thickening and calcification, consistent with sclerosis. - Aorta: Aortic root dimension: 39 mm (ED). - Aortic root: The aortic root was mildly dilated. - Atrial septum: There was increased thickness of the septum, consistent with lipomatous hypertrophy.  Transthoracic echocardiography. M-mode, complete 2D, spectral Doppler, and color Doppler. Birthdate: Patient birthdate: 06/12/1946. Age: Patient is 69 yr old. Sex: Gender: male. BMI: 29.6 kg/m^2. Blood pressure:   130/80 Patient status: Outpatient. Study date: Study date: 05/08/2015. Study time: 08:27 AM. [facility]  -------------------------------------------------------------------  ------------------------------------------------------------------- Left ventricle: Global LV longitudinal strain was -15.2% The cavity size was normal. Systolic  function was normal. The estimated ejection fraction was in the range of 55% to 60%. Wall motion was normal;  there were no regional wall motion abnormalities. There was an increased relative contribution of atrial contraction to ventricular filling. Doppler parameters are consistent with abnormal left ventricular relaxation (grade 1 diastolic dysfunction).  ------------------------------------------------------------------- Aortic valve:  Trileaflet. Moderate thickening and calcification, consistent with sclerosis. Mobility was not restricted. Doppler: Transvalvular velocity was within the normal range. There was no stenosis. There was no regurgitation.  VTI ratio of LVOT to aortic valve: 0.72. Indexed valve area (VTI): 1.04 cm^2/m^2. Mean velocity ratio of LVOT to aortic valve: 0.65. Indexed valve area (Vmean): 0.94 cm^2/m^2.  Mean gradient (S): 4 mm Hg.  ------------------------------------------------------------------- Aorta: Aortic root: The aortic root was mildly dilated.  ------------------------------------------------------------------- Mitral valve:  Structurally normal valve.  Mobility was not restricted. Doppler: Transvalvular velocity was within the normal range. There was no evidence for stenosis. There was no regurgitation.  Peak gradient (D): 2 mm Hg.  ------------------------------------------------------------------- Left atrium: The atrium was normal in size.  ------------------------------------------------------------------- Atrial septum: There was increased thickness of the septum, consistent with lipomatous hypertrophy.  ------------------------------------------------------------------- Right ventricle: The cavity size was normal. Wall thickness was normal. Systolic function was normal.  ------------------------------------------------------------------- Pulmonic valve:  Structurally normal valve.  Cusp separation was normal.  Doppler: Transvalvular velocity was within the normal range. There was no evidence for stenosis. There was no regurgitation.  ------------------------------------------------------------------- Tricuspid valve:  Structurally normal valve.  Doppler: Transvalvular velocity was within the normal range. There was no regurgitation.  ------------------------------------------------------------------- Pulmonary artery:  The main pulmonary artery was normal-sized. Systolic pressure was within the normal range.  ------------------------------------------------------------------- Right atrium: The atrium was normal in size.  ------------------------------------------------------------------- Pericardium: There was no pericardial effusion.  ------------------------------------------------------------------- Systemic veins: Inferior vena cava: The vessel was mildly dilated.  ------------------------------------------------------------------- Measurements  Left ventricle              Value     Reference LV ID, ED, PLAX chordal          43  mm    43 - 52 LV ID, ES, PLAX chordal          33  mm    23 - 38 LV fx shortening, PLAX chordal   (L)  23  %    >=29 LV PW thickness, ED            10  mm    --------- IVS/LV PW ratio, ED            1       <=1.3 Stroke volume, 2D             66  ml    --------- Stroke volume/bsa, 2D           30  ml/m^2  --------- LV e&', lateral              8.27 cm/s   --------- LV E/e&', lateral             9.03      --------- LV e&', medial               6.31 cm/s   --------- LV E/e&', medial              11.84     --------- LV e&', average              7.29 cm/s   --------- LV E/e&', average             10.25      --------- Longitudinal strain, TDI  15  %    ---------  Ventricular septum            Value     Reference IVS thickness, ED             10  mm    ---------  LVOT                   Value     Reference LVOT ID, S                20  mm    --------- LVOT area                 3.14 cm^2   --------- LVOT mean velocity, S           58.9 cm/s   --------- LVOT VTI, S                20.9 cm    ---------  Aortic valve               Value     Reference Aortic valve mean velocity, S       91.2 cm/s   --------- Aortic valve VTI, S            29.2 cm    --------- Aortic mean gradient, S          4   mm Hg  --------- VTI ratio, LVOT/AV            0.72      --------- Aortic valve area/bsa, VTI        1.04 cm^2/m^2 --------- Velocity ratio, mean, LVOT/AV       0.65      --------- Aortic valve area/bsa, mean        0.94 cm^2/m^2 --------- velocity  Aorta                   Value     Reference Aortic root ID, ED            39  mm    --------- Ascending aorta ID, A-P, S        31  mm    ---------  Left atrium                Value     Reference LA ID, A-P, ES              35  mm    --------- LA ID/bsa, A-P              1.58 cm/m^2  <=2.2 LA volume, S               50  ml    --------- LA volume/bsa, S             22.6 ml/m^2  --------- LA volume, ES, 1-p A4C          49.1 ml    --------- LA volume/bsa, ES, 1-p A4C        22.1 ml/m^2  --------- LA volume, ES, 1-p A2C          46.9 ml    --------- LA volume/bsa, ES, 1-p A2C        21.2  ml/m^2  ---------  Mitral valve               Value     Reference Mitral E-wave peak velocity        74.7 cm/s   ---------  Mitral deceleration time         169  ms    150 - 230 Mitral peak gradient, D          2   mm Hg  --------- Mitral E/A ratio, peak          0.6      ---------  Systemic veins              Value     Reference Estimated CVP               8   mm Hg  ---------  Right ventricle              Value     Reference RV s&', lateral, S             11  cm/s   ---------  Legend: (L) and (H) mark values outside specified reference range.  ------------------------------------------------------------------- Prepared and Electronically Authenticated by  Fransico Him, MD 2016-09-27T09:14:02  Study Highlights nuclear medicine:    Nuclear stress EF: 52%.  Upsloping ST segment depression ST segment depression was noted during stress in the V4, V5 and V3 leads, beginning at 2 minutes of stress, ending at 6 minutes of stress, and returning to baseline after 1-5 minutes of recovery.  No T wave inversion was noted during stress.  Hypertensive BP response to exercise  Defect 1: There is a medium defect of moderate severity present in the basal inferior, mid inferior and apical inferior location. Consistent with diaphragmatic attenuation.  The study is abnormal, but there is no evidence of reversible ischemia.  This is an intermediate risk study. Intermediate risk is based on Duke Treadmill Score of 1, which evidences poor exercise tolerance and the fact that he had symptoms during the stress test.  The left ventricular ejection fraction is mildly decreased (45-54%).         CT scan 05/09/15 IMPRESSION: 1. No acute findings in the abdomen or pelvis to account for the patient's symptoms. 2. Extensive colonic  diverticulosis, without evidence to suggest acute diverticulitis at this time. 3. Extensive atherosclerosis status post aortobifemoral bypass graft placement, with multiple vascular findings (most notable for complete occlusion or high-grade stenosis of the left superficial femoral artery, and high-grade stenosis of the proximal right superficial femoral artery), as detailed above. 4. Normal appendix. 5. Additional incidental findings, as above.  Chronic Kidney Disease   Stage I     GFR >90  Stage II    GFR 60-89  Stage IIIA GFR 45-59  Stage IIIB GFR 30-44  Stage IV   GFR 15-29  Stage V    GFR  <15  Lab Results  Component Value Date   CREATININE 1.48* 05/21/2015   Estimated Creatinine Clearance: 54.4 mL/min (by C-G formula based on Cr of 1.48).    Assessment / Plan:   Severe complex 2 vessel disease.  Mildly  dilated aortic root Agree with cabg as best treatment with complex lad disease. Patient is at increased risk of cabg due to dm, cerebral vascular and peripheral vascular disease  ckd stage IIIA Hyperlipidemia  Hypertension  Diabetes mellitus  Peripheral vascular disease ([name])  Arthritis  Reflux esophagitis  Stroke ([place]) in past left face left arm and leg   Neuropathy ([name])  Osteoporosis  Sleep apnea  GERD (gastroesophageal reflux disease)  H/O hiatal hernia  Irritable bowel syndrome (IBS)  Myocardial infarction ([place])  Anemia  Carotid artery occlusion  Coronary artery disease  Shortness of breath dyspnea  Extensive diviticululosis  The goals risks and alternatives of the planned surgical procedure CABG  have been discussed with the patient in detail. The risks of the procedure including death, infection, stroke, myocardial infarction, bleeding, blood transfusion have all been discussed specifically.  I have quoted Bradley [name] a 4 % of perioperative mortality and a complication rate as high as 40 %. The patient's questions have been answered.Bradley [name]  is willing  to proceed with the planned procedure.  Grace Isaac MD      [address] [place] [address] Office [phone]   [place]

## 2015-05-24 ENCOUNTER — Inpatient Hospital Stay (HOSPITAL_COMMUNITY): Payer: PPO

## 2015-05-24 ENCOUNTER — Encounter (HOSPITAL_COMMUNITY): Payer: Self-pay | Admitting: Cardiothoracic Surgery

## 2015-05-24 LAB — GLUCOSE, CAPILLARY
Glucose-Capillary: 106 mg/dL — ABNORMAL HIGH (ref 65–99)
Glucose-Capillary: 113 mg/dL — ABNORMAL HIGH (ref 65–99)
Glucose-Capillary: 114 mg/dL — ABNORMAL HIGH (ref 65–99)
Glucose-Capillary: 142 mg/dL — ABNORMAL HIGH (ref 65–99)
Glucose-Capillary: 153 mg/dL — ABNORMAL HIGH (ref 65–99)
Glucose-Capillary: 157 mg/dL — ABNORMAL HIGH (ref 65–99)
Glucose-Capillary: 160 mg/dL — ABNORMAL HIGH (ref 65–99)
Glucose-Capillary: 164 mg/dL — ABNORMAL HIGH (ref 65–99)

## 2015-05-24 LAB — POCT I-STAT, CHEM 8
BUN: 14 mg/dL (ref 6–20)
BUN: 17 mg/dL (ref 6–20)
Calcium, Ion: 1.24 mmol/L (ref 1.13–1.30)
Calcium, Ion: 1.22 mmol/L (ref 1.13–1.30)
Chloride: 106 mmol/L (ref 101–111)
Chloride: 102 mmol/L (ref 101–111)
Creatinine, Ser: 1.4 mg/dL — ABNORMAL HIGH (ref 0.61–1.24)
Creatinine, Ser: 1.6 mg/dL — ABNORMAL HIGH (ref 0.61–1.24)
Glucose, Bld: 117 mg/dL — ABNORMAL HIGH (ref 65–99)
Glucose, Bld: 182 mg/dL — ABNORMAL HIGH (ref 65–99)
HCT: 30 % — ABNORMAL LOW (ref 39.0–52.0)
HCT: 36 % — ABNORMAL LOW (ref 39.0–52.0)
Hemoglobin: 10.2 g/dL — ABNORMAL LOW (ref 13.0–17.0)
Hemoglobin: 12.2 g/dL — ABNORMAL LOW (ref 13.0–17.0)
Potassium: 4.1 mmol/L (ref 3.5–5.1)
Potassium: 4.6 mmol/L (ref 3.5–5.1)
Sodium: 140 mmol/L (ref 135–145)
Sodium: 136 mmol/L (ref 135–145)
TCO2: 20 mmol/L (ref 0–100)
TCO2: 19 mmol/L (ref 0–100)

## 2015-05-24 LAB — CBC
HCT: 30.2 % — ABNORMAL LOW (ref 39.0–52.0)
HCT: 33.6 % — ABNORMAL LOW (ref 39.0–52.0)
Hemoglobin: 10.1 g/dL — ABNORMAL LOW (ref 13.0–17.0)
Hemoglobin: 11.1 g/dL — ABNORMAL LOW (ref 13.0–17.0)
MCH: 32.3 pg (ref 26.0–34.0)
MCH: 32.1 pg (ref 26.0–34.0)
MCHC: 33.4 g/dL (ref 30.0–36.0)
MCHC: 33 g/dL (ref 30.0–36.0)
MCV: 96.5 fL (ref 78.0–100.0)
MCV: 97.1 fL (ref 78.0–100.0)
Platelets: 171 10*3/uL (ref 150–400)
Platelets: 190 10*3/uL (ref 150–400)
RBC: 3.13 MIL/uL — ABNORMAL LOW (ref 4.22–5.81)
RBC: 3.46 MIL/uL — ABNORMAL LOW (ref 4.22–5.81)
RDW: 13.3 % (ref 11.5–15.5)
RDW: 13.5 % (ref 11.5–15.5)
WBC: 11.6 10*3/uL — ABNORMAL HIGH (ref 4.0–10.5)
WBC: 19.3 10*3/uL — ABNORMAL HIGH (ref 4.0–10.5)

## 2015-05-24 LAB — BASIC METABOLIC PANEL
Anion gap: 9 (ref 5–15)
BUN: 13 mg/dL (ref 6–20)
CO2: 22 mmol/L (ref 22–32)
Calcium: 8.2 mg/dL — ABNORMAL LOW (ref 8.9–10.3)
Chloride: 105 mmol/L (ref 101–111)
Creatinine, Ser: 1.5 mg/dL — ABNORMAL HIGH (ref 0.61–1.24)
GFR calc Af Amer: 52 mL/min — ABNORMAL LOW (ref >60)
GFR calc non Af Amer: 45 mL/min — ABNORMAL LOW (ref >60)
Glucose, Bld: 116 mg/dL — ABNORMAL HIGH (ref 65–99)
Potassium: 4.1 mmol/L (ref 3.5–5.1)
Sodium: 136 mmol/L (ref 135–145)

## 2015-05-24 LAB — POCT I-STAT 3, ART BLOOD GAS (G3+)
Acid-base deficit: 5 mmol/L — ABNORMAL HIGH (ref 0.0–2.0)
Bicarbonate: 21.2 mEq/L (ref 20.0–24.0)
O2 Saturation: 91 %
Patient temperature: 37.3
TCO2: 22 mmol/L (ref 0–100)
pCO2 arterial: 43.4 mmHg (ref 35.0–45.0)
pH, Arterial: 7.297 — ABNORMAL LOW (ref 7.350–7.450)
pO2, Arterial: 70 mmHg — ABNORMAL LOW (ref 80.0–100.0)

## 2015-05-24 LAB — CREATININE, SERUM
Creatinine, Ser: 1.78 mg/dL — ABNORMAL HIGH (ref 0.61–1.24)
GFR calc Af Amer: 43 mL/min — ABNORMAL LOW (ref >60)
GFR calc non Af Amer: 37 mL/min — ABNORMAL LOW (ref >60)

## 2015-05-24 LAB — MAGNESIUM
Magnesium: 2.1 mg/dL (ref 1.7–2.4)
Magnesium: 1.9 mg/dL (ref 1.7–2.4)

## 2015-05-24 MED ORDER — HYDROMORPHONE HCL 2 MG PO TABS
2.0000 mg | ORAL_TABLET | ORAL | Status: DC | PRN
  Administered 2015-05-24 – 2015-05-25 (×3): 2 mg via ORAL
  Filled 2015-05-24 (×3): qty 1

## 2015-05-24 MED ORDER — ENOXAPARIN SODIUM 30 MG/0.3ML ~~LOC~~ SOLN
30.0000 mg | SUBCUTANEOUS | Status: DC
Start: 2015-05-24 — End: 2015-05-29
  Administered 2015-05-24 – 2015-05-28 (×5): 30 mg via SUBCUTANEOUS
  Filled 2015-05-24 (×6): qty 0.3

## 2015-05-24 MED ORDER — FUROSEMIDE 10 MG/ML IJ SOLN
20.0000 mg | Freq: Two times a day (BID) | INTRAMUSCULAR | Status: DC
  Administered 2015-05-24 – 2015-05-25 (×2): 20 mg via INTRAVENOUS
  Filled 2015-05-24 (×4): qty 2

## 2015-05-24 MED ORDER — INSULIN DETEMIR 100 UNIT/ML ~~LOC~~ SOLN
15.0000 [IU] | Freq: Every day | SUBCUTANEOUS | Status: DC
  Administered 2015-05-25 – 2015-05-28 (×4): 15 [IU] via SUBCUTANEOUS
  Filled 2015-05-24 (×5): qty 0.15

## 2015-05-24 MED ORDER — CETYLPYRIDINIUM CHLORIDE 0.05 % MT LIQD: 7.0000 mL | Freq: Two times a day (BID) | OROMUCOSAL | Status: DC

## 2015-05-24 MED ORDER — INSULIN ASPART 100 UNIT/ML ~~LOC~~ SOLN
0.0000 [IU] | SUBCUTANEOUS | Status: DC
  Administered 2015-05-24 (×4): 2 [IU] via SUBCUTANEOUS

## 2015-05-24 MED ORDER — INSULIN DETEMIR 100 UNIT/ML ~~LOC~~ SOLN
15.0000 [IU] | Freq: Once | SUBCUTANEOUS | Status: AC
  Administered 2015-05-24: 15 [IU] via SUBCUTANEOUS
  Filled 2015-05-24: qty 0.15

## 2015-05-24 MED FILL — Magnesium Sulfate Inj 50%: INTRAMUSCULAR | Qty: 10 | Status: AC

## 2015-05-24 MED FILL — Potassium Chloride Inj 2 mEq/ML: INTRAVENOUS | Qty: 40 | Status: AC

## 2015-05-24 MED FILL — Heparin Sodium (Porcine) Inj 1000 Unit/ML: INTRAMUSCULAR | Qty: 30 | Status: AC

## 2015-05-24 NOTE — Progress Notes (Signed)
RT placed patient on home CPAP. Patient is resting comfortably.

## 2015-05-24 NOTE — Op Note (Signed)
NAME:  Bradley [name], Bradley [name]               ACCOUNT NO.:  [id number]  MEDICAL RECORD NO.:  [id number]  LOCATION:  2S09C                        FACILITY:  [facility]  PHYSICIAN:  Lanelle Bal, MD    DATE OF BIRTH:  1946-07-12  DATE OF PROCEDURE:  05/23/2015 DATE OF DISCHARGE:                              OPERATIVE REPORT   PREOPERATIVE DIAGNOSIS:  Coronary occlusive disease with positive stress test.  POSTOPERATIVE DIAGNOSIS:  Coronary occlusive disease with positive stress test.  SURGICAL PROCEDURE:  Coronary artery bypass grafting x3 with the left internal mammary to the left anterior descending coronary artery, reverse saphenous vein graft to the diagonal coronary artery, reverse saphenous vein graft to the posterior descending coronary artery with right thigh greater saphenous vein harvested endoscopically.  SURGEON:  Lanelle Bal, MD.  FIRST ASSISTANT:  Lars Pinks, PA.  BRIEF HISTORY:  The patient is a 69 year old male with known cerebrovascular disease, severe peripheral vascular disease, and diabetes.  Recently, he noted increasing shortness of breath with exertion.  Stress test was performed, was of moderate risk and he underwent cardiac catheterization by Dr. Tamala Julian which demonstrated a high- grade stenosis of the mid LAD right at trifurcating diagonal branches. In addition, he had a mid 70% obstruction.  The circumflex system was with luminal irregularities, but no high-grade stenosis.  Overall, ventricular function was preserved.  Because of the complex nature of the LAD disease, coronary artery, and the patient's diabetes, coronary artery bypass grafting was recommended to the patient.  Risks and options were discussed and the patient agreed and signed informed consent.  DESCRIPTION OF PROCEDURE:  With Swan-Ganz and arterial line monitors in place, the patient underwent general endotracheal anesthesia without incident.  The skin of the chest and legs was prepped  with Betadine and draped in usual sterile manner.  Appropriate time-out was performed.  We proceeded with endo vein harvesting with Guidant endo vein harvest system of the right greater saphenous vein from the knee to the right groin.  The vessel was of good quality.  Median sternotomy was performed.  Left internal mammary artery was dissected down as a pedicle graft.  The distal artery was divided and had a good free flow. Pericardium was opened.  Overall, ventricular function appeared preserved.  The patient was systemically heparinized.  The ascending aorta was cannulated.  The right atrium was cannulated.  An aortic root vent cardioplegia needle was introduced into the ascending aorta.  The patient was placed on cardiopulmonary bypass 2.4 L/min/m2.  Sites of anastomosis were selected and dissected out the epicardium.  The patient's distal right coronary artery was severely calcified.  The proximal posterior descending was suitable for grafting.  The patient's LAD was mostly intramyocardial.  The trifurcating system of the LAD and two diagonals, the middle diagonal was extremely small less than 1 mm and not bypassable.  The first diagonal was slightly larger size.  The patient's body temperature cooled to 32 degrees.  Aortic crossclamp was applied.  800 mL of cold blood potassium cardioplegia was administered with diastolic arrest of the heart.  Myocardial septal temperature was monitored throughout the cross-clamp.  Attention was turned first to the posterior descending coronary artery, which was  opened and admitted a 1.5-mm probe distally.  Using a running 7-0 Prolene, distal anastomosis was performed with a second reverse saphenous vein graft.  Attention was then turned to the diagonal coronary artery, which was opened and was 1.2-1.3 mm in size.  Using a running 7-0 Prolene, a distal anastomosis was performed.  The patient's LAD as noted was deeply intramyocardial. The very distal  LAD was identified, it was relatively small and distal to the anastomosis.  I dissected up to LAD to some degree, identified and opened it.  A 1-mm probe passed distally and proximally.  Using a running 8-0 Prolene, left internal mammary artery was anastomosed to the left anterior descending coronary artery.  With release of the bulldog on the mammary artery, there was rise in myocardial septal temperature. The bulldog was placed back on the mammary artery.  With the crossclamp still in place, 2 punch aortotomies were performed and each of the two vein grafts anastomosed to the ascending aorta.  The bulldog was removed from the mammary artery with rise in myocardial septal temperature.  The heart and ascending aorta were de-aired and the proximal anastomosis completed.  As we removed the aortic crossclamp, the total crossclamp time was 83 minutes.  The patient spontaneously converted to a sinus rhythm.  Sites of anastomosis were inspected and free of bleeding.  The patient was then ventilated and weaned from cardiopulmonary bypass without difficulty.  He remained hemodynamically stable.  He was decannulated in usual fashion.  Protamine sulfate was administered. With the operative field hemostatic, atrial and ventricular pacing wires were applied.  Graft marker was applied.  Left pleural tube and a Blake mediastinal drain were left in place.  Pericardium was loosely reapproximated.  The sternum was closed with #6 stainless steel wire. Fascia was closed with interrupted 0 Vicryl, running 3-0 Vicryl in subcutaneous tissue, and 4-0 subcuticular stitch in skin edges.  Dry dressings were applied.  Sponge and needle count was reported as correct at the completion of procedure.  The patient tolerated the procedure without obvious complication and was transferred to the Surgical Intensive Care Unit for further postoperative care.  He did not require any blood bank blood products.  Total pump time  was 123 minutes.     Lanelle Bal, MD     EG/MEDQ  D:  05/24/2015  T:  05/24/2015  Job:  [id number]

## 2015-05-24 NOTE — Progress Notes (Signed)
CT surgery p.m. Rounds  Patient examined and record reviewed.Hemodynamics stable,labs satisfactory.Patient had stable day. Input-output balance +1.2 L today-IV Lasix ordered  Continue current care. Tharon Aquas Trigt III 05/24/2015

## 2015-05-24 NOTE — Progress Notes (Addendum)
[facility] DAILY ICU PROGRESS NOTE                   [address]            [place] [address]          [phone]   1 Day Post-Op Procedure(s) (LRB): CORONARY ARTERY BYPASS GRAFTING (CABG) x 3 (LIMA to LAD, SVG to DIAGONAL 2, SVG to PDA) with Endoscopic Vein Harvesting from right greater saphenous vein (N/A) TRANSESOPHAGEAL ECHOCARDIOGRAM (TEE) (N/A)  Total Length of Stay:  LOS: 1 day   Subjective: "I feel a lot better than I thought I would."  Some soreness, breathing stable. No nausea.   Objective: Vital signs in last 24 hours: Temp:  [95.7 F (35.4 C)-100 F (37.8 C)] 99.7 F (37.6 C) (10/13 0800) Pulse Rate:  [77-106] 106 (10/13 0800) Cardiac Rhythm:  [-] Normal sinus rhythm (10/13 0800) Resp:  [12-22] 20 (10/13 0800) BP: (57-147)/(42-89) 134/76 mmHg (10/13 0800) SpO2:  [92 %-100 %] 94 % (10/13 0800) Arterial Line BP: (74-159)/(46-77) 159/56 mmHg (10/13 0800) FiO2 (%):  [40 %-50 %] 40 % (10/12 2000) Weight:  [213 lb 6.5 oz (96.8 kg)] 213 lb 6.5 oz (96.8 kg) (10/13 0400)  Filed Weights   05/23/15 0649 05/23/15 0706 05/24/15 0400  Weight: 210 lb (95.255 kg) 210 lb (95.255 kg) 213 lb 6.5 oz (96.8 kg)    Weight change: 0 lb (0 kg)   Hemodynamic parameters for last 24 hours: PAP: (24-49)/(13-27) 47/24 mmHg CO:  [3.9 L/min-6.3 L/min] 6.3 L/min CI:  [2.5 L/min/m2-4.1 L/min/m2] 4.1 L/min/m2  Intake/Output from previous day: 10/12 0701 - 10/13 0700 In: 5500.6 [I.V.:3340.6; Blood:550; NG/GT:60; IV Piggyback:1550] Out: [id number] [Urine:2470; Emesis/NG output:200; Blood:1300; Chest Tube:400]  Intake/Output this shift: Total I/O In: 31 [I.V.:31] Out: 45 [Urine:35; Chest Tube:10]   CBGs 105-136-186-193-198-164-142-114-113-106   Current Meds: Scheduled Meds: . acetaminophen  1,000 mg Oral 4 times per day   Or  . acetaminophen (TYLENOL) oral liquid 160 mg/5 mL  1,000 mg Per Tube 4 times per day  . antiseptic oral rinse  7 mL Mouth Rinse BID  . antiseptic  oral rinse  7 mL Mouth Rinse QID  . aspirin EC  325 mg Oral Daily   Or  . aspirin  324 mg Per Tube Daily  . atorvastatin  40 mg Oral Daily  . bisacodyl  10 mg Oral Daily   Or  . bisacodyl  10 mg Rectal Daily  . docusate sodium  200 mg Oral Daily  . [START ON 05/25/2015] ferrous gluconate  324 mg Oral BID  . insulin regular  0-10 Units Intravenous TID WC  . levofloxacin (LEVAQUIN) IV  750 mg Intravenous Q24H  . metoprolol tartrate  12.5 mg Oral BID   Or  . metoprolol tartrate  12.5 mg Per Tube BID  . multivitamin with minerals  1 tablet Oral Daily  . [START ON 05/25/2015] pantoprazole  40 mg Oral Daily  . sodium chloride  3 mL Intravenous Q12H   Continuous Infusions: . sodium chloride 10 mL/hr at 05/23/15 1900  . sodium chloride    . sodium chloride 10 mL/hr at 05/23/15 1900  . dexmedetomidine Stopped (05/23/15 2200)  . DOPamine Stopped (05/23/15 1445)  . insulin (NOVOLIN-R) infusion 1 Units/hr (05/24/15 0800)  . lactated ringers 10 mL/hr at 05/23/15 1900  . lactated ringers Stopped (05/23/15 1430)  . nitroGLYCERIN 5 mcg/min (05/24/15 0800)  . phenylephrine (NEO-SYNEPHRINE) Adult infusion Stopped (05/23/15 1800)  PRN Meds:.sodium chloride, albumin human, lactated ringers, metoprolol, midazolam, morphine injection, ondansetron (ZOFRAN) IV, sodium chloride, traMADol  Physical Exam: General appearance: alert, cooperative and no distress Heart: regular rate and rhythm and mildly tachy around 100 Lungs: diminished breath sounds bibasilar Abdomen: soft, non-tender; bowel sounds normal; no masses,  no organomegaly Extremities: No edema Wound: Clean and dry  Lab Results: CBC: Recent Labs  05/23/15 2020  05/24/15 0321 05/24/15 0325  WBC 8.5  --   --  11.6*  HGB 9.4*  < > 10.2* 10.1*  HCT 28.3*  < > 30.0* 30.2*  PLT 158  --   --  171  < > = values in this interval not displayed. BMET:  Recent Labs  05/21/15 1332  05/24/15 0321 05/24/15 0325  NA 137  < > 140 136  K  4.6  < > 4.1 4.1  CL 106  < > 106 105  CO2 21*  --   --  22  GLUCOSE 128*  < > 117* 116*  BUN 19  < > 14 13  CREATININE 1.48*  < > 1.40* 1.50*  CALCIUM 9.8  --   --  8.2*  < > = values in this interval not displayed.  PT/INR:  Recent Labs  05/23/15 1440  LABPROT 18.3*  INR 1.51*   Radiology: Dg Chest Port 1 View  05/24/2015  CLINICAL DATA:  Status post CABG EXAM: PORTABLE CHEST 1 VIEW COMPARISON:  Portable chest x-ray of May 23, 2015 FINDINGS: For lungs remain mildly hypoinflated. There has been interval extubation of the trachea and of the esophagus. The pulmonary interstitial markings remain increased. Subsegmental atelectasis in the left mid lung persists. No pneumothorax is evident. The left-sided chest tube and a left-sided mediastinal drain are un changed in position. The Swan-Ganz catheter tip projects over the proximal right main pulmonary artery. The cardiac silhouette is top-normal in size. The pulmonary vascularity is more normal in appearance today. IMPRESSION: Improving appearance of the pulmonary interstitium consistent with decreasing interstitial edema. No pneumothorax or significant pleural effusion. The support tubes are in reasonable position. Electronically Signed   By: David  Martinique M.D.   On: 05/24/2015 08:03   Dg Chest Port 1 View  05/23/2015  CLINICAL DATA:  Post CABG EXAM: PORTABLE CHEST 1 VIEW COMPARISON:  05/11/2015 FINDINGS: Endotracheal tube is 5 cm above the carina. Left chest tube in place. No pneumothorax. Swan-Ganz catheter tip in the right pulmonary artery centrally. NG tube coils in the stomach. Low lung volumes with areas of atelectasis in the left lung. No confluent opacity on the right. No visible effusions. IMPRESSION: Postoperative changes with support devices as above. No pneumothorax. Areas of left lung atelectasis. Low lung volumes. Electronically Signed   By: Rolm Baptise M.D.   On: 05/23/2015 14:53     Assessment/Plan: S/P Procedure(s)  (LRB): CORONARY ARTERY BYPASS GRAFTING (CABG) x 3 (LIMA to LAD, SVG to DIAGONAL 2, SVG to PDA) with Endoscopic Vein Harvesting from right greater saphenous vein (N/A) TRANSESOPHAGEAL ECHOCARDIOGRAM (TEE) (N/A)  CV- SR/ST, BPs stable. Off all drips. Start beta blocker.  DM- sugars generally stable. Transition from insulin gtt to Levemir. Resume po meds once eating. A1C=8.1  Expected postop blood loss anemia- mild, will watch.  CKD- Cr generally stable (1.48 prior to admission). UOP good.  Will mobilize, d/c swan, a-line, mediastinal CT. Routine POD #1 progression.   Bradley [name],Bradley [name] 05/24/2015 8:36 AM  Stable this am I have seen and examined Bradley [name] and agree  with the above assessment  and plan.  Grace Isaac MD [facility] [phone] Office [phone] 05/24/2015 10:31 AM        Grace Isaac MD [facility] [phone] Office [phone] 05/24/2015 10:21 AM

## 2015-05-25 ENCOUNTER — Inpatient Hospital Stay (HOSPITAL_COMMUNITY): Payer: PPO

## 2015-05-25 LAB — BASIC METABOLIC PANEL
Anion gap: 7 (ref 5–15)
BUN: 18 mg/dL (ref 6–20)
CO2: 24 mmol/L (ref 22–32)
Calcium: 8.1 mg/dL — ABNORMAL LOW (ref 8.9–10.3)
Chloride: 100 mmol/L — ABNORMAL LOW (ref 101–111)
Creatinine, Ser: 1.72 mg/dL — ABNORMAL HIGH (ref 0.61–1.24)
GFR calc Af Amer: 44 mL/min — ABNORMAL LOW (ref >60)
GFR calc non Af Amer: 38 mL/min — ABNORMAL LOW (ref >60)
Glucose, Bld: 137 mg/dL — ABNORMAL HIGH (ref 65–99)
Potassium: 4.2 mmol/L (ref 3.5–5.1)
Sodium: 131 mmol/L — ABNORMAL LOW (ref 135–145)

## 2015-05-25 LAB — GLUCOSE, CAPILLARY
Glucose-Capillary: 117 mg/dL — ABNORMAL HIGH (ref 65–99)
Glucose-Capillary: 138 mg/dL — ABNORMAL HIGH (ref 65–99)
Glucose-Capillary: 155 mg/dL — ABNORMAL HIGH (ref 65–99)
Glucose-Capillary: 155 mg/dL — ABNORMAL HIGH (ref 65–99)
Glucose-Capillary: 159 mg/dL — ABNORMAL HIGH (ref 65–99)
Glucose-Capillary: 160 mg/dL — ABNORMAL HIGH (ref 65–99)
Glucose-Capillary: 158 mg/dL — ABNORMAL HIGH (ref 65–99)
Glucose-Capillary: 156 mg/dL — ABNORMAL HIGH (ref 65–99)
Glucose-Capillary: 174 mg/dL — ABNORMAL HIGH (ref 65–99)
Glucose-Capillary: 159 mg/dL — ABNORMAL HIGH (ref 65–99)
Glucose-Capillary: 160 mg/dL — ABNORMAL HIGH (ref 65–99)
Glucose-Capillary: 119 mg/dL — ABNORMAL HIGH (ref 65–99)
Glucose-Capillary: 125 mg/dL — ABNORMAL HIGH (ref 65–99)
Glucose-Capillary: 221 mg/dL — ABNORMAL HIGH (ref 65–99)
Glucose-Capillary: 163 mg/dL — ABNORMAL HIGH (ref 65–99)
Glucose-Capillary: 168 mg/dL — ABNORMAL HIGH (ref 65–99)

## 2015-05-25 LAB — CBC
HCT: 31.1 % — ABNORMAL LOW (ref 39.0–52.0)
Hemoglobin: 10.4 g/dL — ABNORMAL LOW (ref 13.0–17.0)
MCH: 32.7 pg (ref 26.0–34.0)
MCHC: 33.4 g/dL (ref 30.0–36.0)
MCV: 97.8 fL (ref 78.0–100.0)
Platelets: 168 10*3/uL (ref 150–400)
RBC: 3.18 MIL/uL — ABNORMAL LOW (ref 4.22–5.81)
RDW: 13.6 % (ref 11.5–15.5)
WBC: 16.3 10*3/uL — ABNORMAL HIGH (ref 4.0–10.5)

## 2015-05-25 MED ORDER — DOCUSATE SODIUM 100 MG PO CAPS
200.0000 mg | ORAL_CAPSULE | Freq: Every day | ORAL | Status: DC
  Administered 2015-05-25 – 2015-05-29 (×5): 200 mg via ORAL
  Filled 2015-05-25 (×5): qty 2

## 2015-05-25 MED ORDER — SODIUM CHLORIDE 0.9 % IV SOLN: 250.0000 mL | INTRAVENOUS | Status: DC | PRN

## 2015-05-25 MED ORDER — MOVING RIGHT ALONG BOOK
Freq: Once | Status: AC
  Filled 2015-05-25: qty 1

## 2015-05-25 MED ORDER — TRAMADOL HCL 50 MG PO TABS
50.0000 mg | ORAL_TABLET | Freq: Four times a day (QID) | ORAL | Status: DC | PRN
  Administered 2015-05-25 – 2015-05-29 (×2): 50 mg via ORAL
  Filled 2015-05-25 (×2): qty 1

## 2015-05-25 MED ORDER — FUROSEMIDE 40 MG PO TABS
40.0000 mg | ORAL_TABLET | Freq: Every day | ORAL | Status: AC
Start: 2015-05-25 — End: 2015-05-28
  Administered 2015-05-26 – 2015-05-27 (×2): 40 mg via ORAL
  Filled 2015-05-25 (×2): qty 1

## 2015-05-25 MED ORDER — PANTOPRAZOLE SODIUM 40 MG PO TBEC
40.0000 mg | DELAYED_RELEASE_TABLET | Freq: Every day | ORAL | Status: DC
  Administered 2015-05-25 – 2015-05-29 (×5): 40 mg via ORAL
  Filled 2015-05-25 (×6): qty 1

## 2015-05-25 MED ORDER — CARVEDILOL 3.125 MG PO TABS
3.1250 mg | ORAL_TABLET | Freq: Two times a day (BID) | ORAL | Status: DC
  Administered 2015-05-25 – 2015-05-26 (×3): 3.125 mg via ORAL
  Filled 2015-05-25 (×4): qty 1

## 2015-05-25 MED ORDER — BISACODYL 5 MG PO TBEC: 10.0000 mg | DELAYED_RELEASE_TABLET | Freq: Every day | ORAL | Status: DC | PRN

## 2015-05-25 MED ORDER — SODIUM CHLORIDE 0.9 % IJ SOLN
3.0000 mL | Freq: Two times a day (BID) | INTRAMUSCULAR | Status: DC
  Administered 2015-05-25 – 2015-05-28 (×4): 3 mL via INTRAVENOUS

## 2015-05-25 MED ORDER — AMIODARONE HCL IN DEXTROSE 360-4.14 MG/200ML-% IV SOLN
INTRAVENOUS | Status: AC
  Administered 2015-05-25: 60 mg/h via INTRAVENOUS
  Filled 2015-05-25: qty 200

## 2015-05-25 MED ORDER — MOVING RIGHT ALONG BOOK
Freq: Once | Status: AC
  Administered 2015-05-25: 08:00:00
  Filled 2015-05-25: qty 1

## 2015-05-25 MED ORDER — ASPIRIN EC 325 MG PO TBEC
325.0000 mg | DELAYED_RELEASE_TABLET | Freq: Every day | ORAL | Status: DC
  Administered 2015-05-25 – 2015-05-29 (×5): 325 mg via ORAL
  Filled 2015-05-25 (×5): qty 1

## 2015-05-25 MED ORDER — INSULIN ASPART 100 UNIT/ML ~~LOC~~ SOLN
0.0000 [IU] | Freq: Three times a day (TID) | SUBCUTANEOUS | Status: DC
  Administered 2015-05-25 (×2): 4 [IU] via SUBCUTANEOUS
  Administered 2015-05-25: 8 [IU] via SUBCUTANEOUS
  Administered 2015-05-26: 2 [IU] via SUBCUTANEOUS
  Administered 2015-05-26 – 2015-05-27 (×3): 4 [IU] via SUBCUTANEOUS
  Administered 2015-05-27: 2 [IU] via SUBCUTANEOUS
  Administered 2015-05-27: 12 [IU] via SUBCUTANEOUS
  Administered 2015-05-28: 2 [IU] via SUBCUTANEOUS
  Administered 2015-05-28: 8 [IU] via SUBCUTANEOUS
  Administered 2015-05-29: 4 [IU] via SUBCUTANEOUS

## 2015-05-25 MED ORDER — AMIODARONE HCL 200 MG PO TABS
200.0000 mg | ORAL_TABLET | Freq: Every day | ORAL | Status: DC
Start: 2015-06-02 — End: 2015-05-27

## 2015-05-25 MED ORDER — BISACODYL 10 MG RE SUPP: 10.0000 mg | Freq: Every day | RECTAL | Status: DC | PRN

## 2015-05-25 MED ORDER — AMIODARONE LOAD VIA INFUSION
150.0000 mg | Freq: Once | INTRAVENOUS | Status: AC
  Administered 2015-05-25: 150 mg via INTRAVENOUS
  Filled 2015-05-25: qty 83.34

## 2015-05-25 MED ORDER — METOCLOPRAMIDE HCL 5 MG/ML IJ SOLN
10.0000 mg | Freq: Four times a day (QID) | INTRAMUSCULAR | Status: DC
Start: 2015-05-25 — End: 2015-05-29
  Administered 2015-05-25 – 2015-05-28 (×7): 10 mg via INTRAVENOUS
  Filled 2015-05-25 (×8): qty 2

## 2015-05-25 MED ORDER — SODIUM CHLORIDE 0.9 % IJ SOLN: 3.0000 mL | INTRAMUSCULAR | Status: DC | PRN

## 2015-05-25 MED ORDER — AMIODARONE HCL IN DEXTROSE 360-4.14 MG/200ML-% IV SOLN
30.0000 mg/h | INTRAVENOUS | Status: DC
  Administered 2015-05-26 – 2015-05-27 (×3): 30 mg/h via INTRAVENOUS
  Filled 2015-05-25 (×6): qty 200

## 2015-05-25 MED ORDER — AMIODARONE HCL 200 MG PO TABS
200.0000 mg | ORAL_TABLET | Freq: Two times a day (BID) | ORAL | Status: DC
  Administered 2015-05-26 (×2): 200 mg via ORAL
  Filled 2015-05-25 (×2): qty 1

## 2015-05-25 MED ORDER — SODIUM CHLORIDE 0.9 % IJ SOLN
3.0000 mL | Freq: Two times a day (BID) | INTRAMUSCULAR | Status: DC
  Administered 2015-05-25 – 2015-05-26 (×2): 3 mL via INTRAVENOUS

## 2015-05-25 MED ORDER — AMIODARONE HCL IN DEXTROSE 360-4.14 MG/200ML-% IV SOLN
60.0000 mg/h | INTRAVENOUS | Status: DC
  Administered 2015-05-25 – 2015-05-26 (×2): 60 mg/h via INTRAVENOUS

## 2015-05-25 NOTE — Discharge Summary (Signed)
[address]       [place] [address]             [phone]              Discharge Summary  Name: Bradley [name] DOB: 08-Nov-1945 69 y.o. MRN: [id number]   Admission Date: 05/23/2015 Discharge Date: 05/29/2015  Admitting Diagnosis: 1.History of CAD (coronary artery disease) (s/p MI 14') 2. History of DM 3. History of hyperlipidemia 4. History of hypertension 5. History of PVD 6. History of stroke 7. History of OSA 8. History of GERD 9. History of anemia 10. Right internal carotid artery stenosis (50-69% per duplex) 11. History of tobacco abuse  Discharge Diagnosis:  1.History of CAD (coronary artery disease) (s/p MI 14') 2. History of DM 3. History of hyperlipidemia 4. History of hypertension 5. History of PVD 6. History of stroke 7. History of OSA 8. History of GERD 9. History of anemia 10. Right internal carotid artery stenosis (50-69% per duplex) 11. History of tobacco abuse 12. Expected postoperative blood loss anemia 13. Mild postoperative ileus 14. A fib with RVR (converted to sinus rhythm) 15. Mildly elevated creatinine (baseline around 1.48)  Past Medical History  Diagnosis Date  . Hyperlipidemia   . Hypertension   . Diabetes mellitus age 59  . Peripheral vascular disease ([place])   . Arthritis   . Reflux esophagitis   . Stroke [facility]) 1987    Right brain stroke  . Neuropathy ([place]) 2013  . Osteoporosis 2013  . Sleep apnea     uses CPAP  . GERD (gastroesophageal reflux disease)   . H/O hiatal hernia   . Irritable bowel syndrome (IBS)   . Myocardial infarction [facility])     silent inferior MI; patient denies MI history (03/17/13)   . Anemia   . Carotid artery occlusion   . Coronary artery disease     cath 05/17/2015 90% mid LAD, 80% ost D2, 75% mid RCA, 35% prox RCA. CT surgery consulted  . Shortness of breath dyspnea   . Diverticulitis   . Wears glasses       Procedures: CORONARY ARTERY BYPASS GRAFTING x 3 -  05/23/2015  Left internal mammary artery to left anterior descending   Saphenous vein graft to diagonal   Saphenous vein graft to posterior descending  Endoscopic greater saphenous vein harvest right thigh    HPI:  The patient is a 69 y.o. male with multiple medical comorbidities including history of CVA, hypertension, type 2 diabetes mellitus, hyperlipidemia, obstructive sleep apnea, right bundle branch block. He also has a history of a extensive peripheral arterial disease with claudication as well as extracranial cerebrovascular occlusive disease s/p L CEA in 1994 with redo in 2001. He has a history of an aortoiliac occlusive disease s/p aorto-bifemoral bypass in 1998. He was recently referred to cardiology for evaluation of coronary artery disease. The patient had noted a several week history of shortness of breath on exertion.  A nuclear myocardial perfusion study was positive intermediate risk with EF 52%.Cardiac cath was performed by Dr. Tamala Julian and revealed high grade stenosis of the mid LAD at trifurcating diagonal branches. In addition, he had a mid 70% obstruction. LV function was well preserved. These lesions were felt to be too complex for PCI and therefore high risk. The patient was referred to Dr. Servando Snare for cardiac surgery evaluation. All risks, benefits and alternatives of surgery were explained in detail, and the patient agreed to proceed.  Hospital Course:  The patient was admitted to [facility] on 05/23/2015. The patient was taken to the operating room and underwent the above procedure.    He was extubated early on and was off all pressors on postop day 1. By postop day 2, he was able to be transferred to the stepdown unit.  He did have some abdominal distention and a mild ileus on x-ray which was treated conservatively. His diet was advanced slowly and his ileus did resolve. His creatinine was mildly elevated post op. Last creatinine was 1.68. His baseline creatinine is around  1.5He went into a fib with RVR and was put on an Amiodarone drip. He converted to sinus rhythm. He is ambulating on room air. He is tolerating a diet and has had a bowel movement. His glucose has been fairly well controlled. He is on Insulin and Amaryl only as his creatinine is still mildly elevated. He will be restarted on Metformin at discharge. His pre op HGA1C was 8.1 so he will need close follow up after discharge with his medical doctor. Epicardial pacing wires will be removed in the am. Chest tube sutures will be removed the day of discharge. He is felt surgically stable for discharge today.  Recent vital signs:  Filed Vitals:   05/29/15 0959  BP: 125/75  Pulse: 86  Temp: 98.1 (36.7)  Resp: 20    Recent laboratory studies:  CBC: No results for input(s): WBC, HGB, HCT, PLT in the last 72 hours. BMET:   Recent Labs  05/28/15 0237 05/29/15 0226  NA 136 135  K 3.6 3.4*  CL 97* 99*  CO2 28 25  GLUCOSE 80 114*  BUN 19 20  CREATININE 1.58* 1.64*  CALCIUM 8.8* 8.7*    PT/INR: No results for input(s): LABPROT, INR in the last 72 hours.   Discharge Medications:     Medication List    STOP taking these medications        aspirin 81 MG tablet  Replaced by:  aspirin 325 MG EC tablet     losartan 100 MG tablet  Commonly known as:  COZAAR     nitroGLYCERIN 0.4 MG SL tablet  Commonly known as:  NITROSTAT     vitamin E 400 UNIT capsule      TAKE these medications        amiodarone 400 MG tablet  Commonly known as:  PACERONE  Take 1 tablet (400 mg total) by mouth 2 (two) times daily. For 3 days;then take Amiodarone 200 mg by mouth two times daily for one week. Finally, take Amiodarone 200 mg by mouth daily thereafter     amLODipine 10 MG tablet  Commonly known as:  NORVASC  Take 1 tablet (10 mg total) by mouth daily.     aspirin 325 MG EC tablet  Take 1 tablet (325 mg total) by mouth daily.     atorvastatin 40 MG tablet  Commonly known as:  LIPITOR  Take 40 mg  by mouth daily.     Calcium-D 600-400 MG-UNIT Tabs  Take 1 tablet by mouth daily.     carvedilol 6.25 MG tablet  Commonly known as:  COREG  Take 1 tablet (6.25 mg total) by mouth 2 (two) times daily with a meal.     D-3-5 5000 UNITS capsule  Generic drug:  Cholecalciferol  Take 5,000 Units by mouth daily.     DEXILANT 60 MG capsule  Generic drug:  dexlansoprazole  Take 60 mg by mouth daily.  glimepiride 2 MG tablet  Commonly known as:  AMARYL  Take 2 mg by mouth daily before breakfast.     IRON (FERROUS GLUCONATE) PO  Take 40 mg by mouth 2 (two) times daily.     metFORMIN 500 MG 24 hr tablet  Commonly known as:  GLUCOPHAGE-XR  Take 2 tablets (1,000 mg total) by mouth 2 (two) times daily.  Start taking on:  05/30/2015     multivitamin capsule  Take 1 capsule by mouth daily.     traMADol 50 MG tablet  Commonly known as:  ULTRAM  Take 1 tablet (50 mg total) by mouth every 6 (six) hours as needed for moderate pain.     VICTOZA [place]  Inject 1.8 mg into the skin daily.     vitamin C 100 MG tablet  Take 100 mg by mouth daily.        The patient has been discharged on:  1.Beta Blocker: Yes [x  ]  No [ ]   If No, reason:    2.Ace Inhibitor/ARB: Yes [ ]   No [x  ]  If No, reason: Elevated creatinine   3.Statin: Yes [x  ]  No [ ]   If No, reason:    4.Ecasa: Yes [ x ]  No [ ]   If No, reason:   Discharge Instructions:  The patient is to refrain from driving, heavy lifting or strenuous activity.  May shower daily and clean incisions with soap and water.  May resume regular diet.   Follow Up: Follow-up Information    Follow up with [facility] [place] On 06/18/2015.   Why:  Have a chest x-ray at [place] at 12:00, then see Dr. Everrett Coombe PA at 1:00   Contact information:   [place] [id number]       Follow up with Richardson Dopp, PA-C On 06/13/2015.   Specialties:  Physician Assistant, Radiology,  Interventional Cardiology   Why:  Richardson Dopp, PA-C 11/2 @11 :10am ([place] ofc)    Contact information:   [id number] [place] [address] [phone]       Follow up with  Melinda Crutch, MD.   Specialty:  Family Medicine   Why:  Call for a follow up appointment regarding further surveillance of HGA1C 8.1, diabetes management, and renal function   Contact information:   [place] [address] [phone]        Nani Skillern PA-C 05/29/2015, 10:08 AM

## 2015-05-25 NOTE — Progress Notes (Signed)
CARDIAC REHAB PHASE I   PRE:  Rate/Rhythm: 91 SR  BP:  Supine: 148/71  Sitting:   Standing:    SaO2: 97%RA  MODE:  Ambulation: 300 ft   POST:  Rate/Rhythm: 98 SR  BP:  Supine: 159/83  Sitting:   Standing:    SaO2: 95%RA 1315-1352 Pt walked 323ft on RA with rolling walker and asst x 1 with steady gait. Stopped once to rest. Wheezing a little by end of walk but sats good on ear. Had difficulty getting sat on finger. Pt's wife wanted pt to practice getting up and down. Pt held to pillow and did well getting to side of bed and rocking to stand. Also did well lying down. Call bell in reach. Tolerated well.   Graylon Good, RN BSN  05/25/2015 1:49 PM

## 2015-05-25 NOTE — Progress Notes (Signed)
05/25/2015 1250 Pt. Transferred to 2w20. RN at bedside to receive patient. Telemetry safety checks performed. Analleli Gierke, Arville Lime

## 2015-05-25 NOTE — Anesthesia Postprocedure Evaluation (Signed)
  Anesthesia Post-op Note  Patient: Bradley [name]  Procedure(s) Performed: Procedure(s) (LRB): CORONARY ARTERY BYPASS GRAFTING (CABG) x 3 (LIMA to LAD, SVG to DIAGONAL 2, SVG to PDA) with Endoscopic Vein Harvesting from right greater saphenous vein (N/A) TRANSESOPHAGEAL ECHOCARDIOGRAM (TEE) (N/A)  Patient Location: ICU  Anesthesia Type: General  Level of Consciousness: sedated  Airway and Oxygen Therapy: Patient intubated  Post-op Pain: mild  Post-op Assessment: Post-op Vital signs reviewed, Patient's Cardiovascular Status Stable, Respiratory Function Stable, Patent Airway and No signs of Nausea or vomiting  Last Vitals:  Filed Vitals:   05/24/15 2300  BP: 114/101  Pulse: 87  Temp:   Resp: 19    Post-op Vital Signs: stable   Complications: No apparent anesthesia complications

## 2015-05-25 NOTE — Progress Notes (Addendum)
[facility] DAILY ICU PROGRESS NOTE                   [address]            [place] [address]          [phone]   2 Days Post-Op Procedure(s) (LRB): CORONARY ARTERY BYPASS GRAFTING (CABG) x 3 (LIMA to LAD, SVG to DIAGONAL 2, SVG to PDA) with Endoscopic Vein Harvesting from right greater saphenous vein (N/A) TRANSESOPHAGEAL ECHOCARDIOGRAM (TEE) (N/A)  Total Length of Stay:  LOS: 2 days   Subjective:  Bradley [name] states he is doing well considering.  He is ambulating.  No BM  Objective: Vital signs in last 24 hours: Temp:  [98 F (36.7 C)-99.7 F (37.6 C)] 98.7 F (37.1 C) (10/14 0401) Pulse Rate:  [74-110] 85 (10/14 0800) Cardiac Rhythm:  [-] Normal sinus rhythm (10/14 0800) Resp:  [9-26] 12 (10/14 0800) BP: (102-147)/(57-101) 147/65 mmHg (10/14 0800) SpO2:  [83 %-100 %] 97 % (10/14 0800) Arterial Line BP: (87-171)/(49-87) 87/59 mmHg (10/14 0400) Weight:  [219 lb 6.4 oz (99.519 kg)] 219 lb 6.4 oz (99.519 kg) (10/14 0600)  Filed Weights   05/23/15 0706 05/24/15 0400 05/25/15 0600  Weight: 210 lb (95.255 kg) 213 lb 6.5 oz (96.8 kg) 219 lb 6.4 oz (99.519 kg)    Weight change: 9 lb 6.4 oz (4.264 kg)   Hemodynamic parameters for last 24 hours: PAP: (49-74)/(24-52) 49/24 mmHg  Intake/Output from previous day: 10/13 0701 - 10/14 0700 In: 2006.6 [P.O.:1500; I.V.:356.6; IV Piggyback:150] Out: 1120 [Urine:920; Chest Tube:200]  Intake/Output this shift: Total I/O In: 10 [I.V.:10] Out: 60 [Urine:60]  Current Meds: Scheduled Meds: . acetaminophen  1,000 mg Oral 4 times per day   Or  . acetaminophen (TYLENOL) oral liquid 160 mg/5 mL  1,000 mg Per Tube 4 times per day  . antiseptic oral rinse  7 mL Mouth Rinse QID  . aspirin EC  325 mg Oral Daily   Or  . aspirin  324 mg Per Tube Daily  . aspirin EC  325 mg Oral Daily  . atorvastatin  40 mg Oral Daily  . bisacodyl  10 mg Oral Daily   Or  . bisacodyl  10 mg Rectal Daily  . carvedilol  3.125 mg Oral BID  WC  . docusate sodium  200 mg Oral Daily  . docusate sodium  200 mg Oral Daily  . enoxaparin (LOVENOX) injection  30 mg Subcutaneous Q24H  . ferrous gluconate  324 mg Oral BID  . furosemide  20 mg Intravenous BID  . furosemide  40 mg Oral Daily  . insulin aspart  0-24 Units Subcutaneous 6 times per day  . insulin aspart  0-24 Units Subcutaneous TID AC & HS  . insulin detemir  15 Units Subcutaneous Daily  . insulin regular  0-10 Units Intravenous TID WC  . metoCLOPramide (REGLAN) injection  10 mg Intravenous 4 times per day  . metoprolol tartrate  12.5 mg Oral BID   Or  . metoprolol tartrate  12.5 mg Per Tube BID  . moving right along book   Does not apply Once  . moving right along book   Does not apply Once  . multivitamin with minerals  1 tablet Oral Daily  . pantoprazole  40 mg Oral Daily  . pantoprazole  40 mg Oral QAC breakfast  . sodium chloride  3 mL Intravenous Q12H  . sodium chloride  3 mL Intravenous Q12H  .  sodium chloride  3 mL Intravenous Q12H   Continuous Infusions: . sodium chloride Stopped (05/24/15 1000)  . sodium chloride    . sodium chloride Stopped (05/24/15 1000)  . dexmedetomidine Stopped (05/23/15 2200)  . insulin (NOVOLIN-R) infusion Stopped (05/24/15 1400)  . lactated ringers 10 mL/hr at 05/23/15 1900   PRN Meds:.sodium chloride, sodium chloride, sodium chloride, bisacodyl **OR** bisacodyl, lactated ringers, metoprolol, midazolam, ondansetron (ZOFRAN) IV, sodium chloride, sodium chloride, sodium chloride, traMADol  General appearance: alert, cooperative and no distress Heart: regular rate and rhythm Lungs: clear to auscultation bilaterally Abdomen: soft, + distention, hypoactive to no BS Extremities: edema trace Wound: clean and dry  Lab Results: CBC: Recent Labs  05/24/15 1640 05/24/15 1649 05/25/15 0500  WBC 19.3*  --  16.3*  HGB 11.1* 12.2* 10.4*  HCT 33.6* 36.0* 31.1*  PLT 190  --  168   BMET:  Recent Labs  05/24/15 0325   05/24/15 1649 05/25/15 0500  NA 136  --  136 131*  K 4.1  --  4.6 4.2  CL 105  --  102 100*  CO2 22  --   --  24  GLUCOSE 116*  --  182* 137*  BUN 13  --  17 18  CREATININE 1.50*  < > 1.60* 1.72*  CALCIUM 8.2*  --   --  8.1*  < > = values in this interval not displayed.  PT/INR:  Recent Labs  05/23/15 1440  LABPROT 18.3*  INR 1.51*   Radiology: Dg Chest Port 1 View  05/25/2015  CLINICAL DATA:  Postop from CABG. EXAM: PORTABLE CHEST 1 VIEW COMPARISON:  05/24/2015 FINDINGS: Swan-Ganz catheter has been removed. Right jugular Cordis remains in place as well as left-sided chest tube. No pneumothorax visualized. Low lung volumes again noted. Mild left upper lobe atelectasis is stable. No evidence of pulmonary consolidation or definite pleural effusion. Heart size remains within normal limits. IMPRESSION: No significant change in low lung volumes and mild left upper lobe atelectasis. No pneumothorax visualized. Electronically Signed   By: Earle Gell M.D.   On: 05/25/2015 07:59     Assessment/Plan: S/P Procedure(s) (LRB): CORONARY ARTERY BYPASS GRAFTING (CABG) x 3 (LIMA to LAD, SVG to DIAGONAL 2, SVG to PDA) with Endoscopic Vein Harvesting from right greater saphenous vein (N/A) TRANSESOPHAGEAL ECHOCARDIOGRAM (TEE) (N/A)  1. CV- hemodynamically stable- will switch to home Coreg, hold ACE/ARB due to elevated creatinine, will monitor BP today, may need to start low dose Norvasc if HTN persists 2. Pulm- no acute issues, off oxygen, will d/c chest tube today, repeat CXR in AM 3. Renal- creatinine remains elevated at 1.72, hold nephrotoxic agents, will transition to oral Lasix, repeat BMET in AM 4. GI- denies nausea, vomiting, no BS present, will continue clear liquid diet, start IV Reglan, d/c narcotics monitor for early Ileus 5. CBGs controlled, continue insulin, will hold oral medications until on regular diet, and creatinine stabilizes 6. Dispo- patient stable, will transfer to  [place] 05/25/2015 8:19 AM  Mild elevation of cr, preop ckd stage III  To circle Mild ileus just on clear liquids now, getting reglan, has hx of extensive diverticulosis   I have seen and examined Bradley [name] and agree with the above assessment  and plan.  Grace Isaac MD Beeper [phone] Office [phone] 05/25/2015 8:26 AM

## 2015-05-25 NOTE — Progress Notes (Signed)
  Amiodarone Drug - Drug Interaction Consult Note  Recommendations: Patient on scheduled metoclopramide which can prolong the QTc interval- watch on telemetry and/or EKG.  Follow for any patient complaints of muscle pain d/t atorvastatin + amiodarone.    Amiodarone is metabolized by the cytochrome P450 system and therefore has the potential to cause many drug interactions. Amiodarone has an average plasma half-life of 50 days (range 20 to 100 days).   There is potential for drug interactions to occur several weeks or months after stopping treatment and the onset of drug interactions may be slow after initiating amiodarone.   [x]  Statins: Increased risk of myopathy. Simvastatin- restrict dose to 20mg  daily. Other statins: counsel patients to report any muscle pain or weakness immediately.  []  Anticoagulants: Amiodarone can increase anticoagulant effect. Consider warfarin dose reduction. Patients should be monitored closely and the dose of anticoagulant altered accordingly, remembering that amiodarone levels take several weeks to stabilize.  []  Antiepileptics: Amiodarone can increase plasma concentration of phenytoin, the dose should be reduced. Note that small changes in phenytoin dose can result in large changes in levels. Monitor patient and counsel on signs of toxicity.  []  Beta blockers: increased risk of bradycardia, AV block and myocardial depression. Sotalol - avoid concomitant use.  []   Calcium channel blockers (diltiazem and verapamil): increased risk of bradycardia, AV block and myocardial depression.  []   Cyclosporine: Amiodarone increases levels of cyclosporine. Reduced dose of cyclosporine is recommended.  []  Digoxin dose should be halved when amiodarone is started.  []  Diuretics: increased risk of cardiotoxicity if hypokalemia occurs.  []  Oral hypoglycemic agents (glyburide, glipizide, glimepiride): increased risk of hypoglycemia. Patient's glucose levels should be  monitored closely when initiating amiodarone therapy.   [x]  Drugs that prolong the QT interval:  Torsades de pointes risk may be increased with concurrent use - avoid if possible.  Monitor QTc, also keep magnesium/potassium WNL if concurrent therapy can't be avoided. Marland Kitchen Antibiotics: e.g. fluoroquinolones, erythromycin. . Antiarrhythmics: e.g. quinidine, procainamide, disopyramide, sotalol. . Antipsychotics: e.g. phenothiazines, haloperidol.  . Lithium, tricyclic antidepressants, and methadone.   Thank You, Margarit Minshall D. Antawn Sison, PharmD, BCPS Clinical Pharmacist 05/25/2015 10:01 PM

## 2015-05-25 NOTE — Progress Notes (Signed)
Patient voided and wife emptied urinal. Pt/wife state approximately 600 ml.  Instructed patient and wife to notify NT or RN after next void to get bladder scanned.  Note placed on white board and RN and NT aware. Pt resting with call bell within reach.  Will continue to monitor. Payton Emerald, RN

## 2015-05-26 ENCOUNTER — Inpatient Hospital Stay (HOSPITAL_COMMUNITY): Payer: PPO

## 2015-05-26 LAB — CBC
HCT: 31.4 % — ABNORMAL LOW (ref 39.0–52.0)
Hemoglobin: 10.7 g/dL — ABNORMAL LOW (ref 13.0–17.0)
MCH: 32.5 pg (ref 26.0–34.0)
MCHC: 34.1 g/dL (ref 30.0–36.0)
MCV: 95.4 fL (ref 78.0–100.0)
Platelets: 191 10*3/uL (ref 150–400)
RBC: 3.29 MIL/uL — ABNORMAL LOW (ref 4.22–5.81)
RDW: 13.3 % (ref 11.5–15.5)
WBC: 15.1 10*3/uL — ABNORMAL HIGH (ref 4.0–10.5)

## 2015-05-26 LAB — GLUCOSE, CAPILLARY
Glucose-Capillary: 115 mg/dL — ABNORMAL HIGH (ref 65–99)
Glucose-Capillary: 203 mg/dL — ABNORMAL HIGH (ref 65–99)
Glucose-Capillary: 170 mg/dL — ABNORMAL HIGH (ref 65–99)
Glucose-Capillary: 132 mg/dL — ABNORMAL HIGH (ref 65–99)

## 2015-05-26 LAB — BASIC METABOLIC PANEL
Anion gap: 11 (ref 5–15)
BUN: 17 mg/dL (ref 6–20)
CO2: 23 mmol/L (ref 22–32)
Calcium: 8.4 mg/dL — ABNORMAL LOW (ref 8.9–10.3)
Chloride: 97 mmol/L — ABNORMAL LOW (ref 101–111)
Creatinine, Ser: 1.68 mg/dL — ABNORMAL HIGH (ref 0.61–1.24)
GFR calc Af Amer: 46 mL/min — ABNORMAL LOW (ref >60)
GFR calc non Af Amer: 39 mL/min — ABNORMAL LOW (ref >60)
Glucose, Bld: 135 mg/dL — ABNORMAL HIGH (ref 65–99)
Potassium: 3.6 mmol/L (ref 3.5–5.1)
Sodium: 131 mmol/L — ABNORMAL LOW (ref 135–145)

## 2015-05-26 LAB — TSH: TSH: 2.63 u[IU]/mL (ref 0.350–4.500)

## 2015-05-26 MED ORDER — ZOLPIDEM TARTRATE 5 MG PO TABS
5.0000 mg | ORAL_TABLET | Freq: Every evening | ORAL | Status: DC | PRN
  Administered 2015-05-26 – 2015-05-28 (×3): 5 mg via ORAL
  Filled 2015-05-26 (×3): qty 1

## 2015-05-26 MED ORDER — CARVEDILOL 6.25 MG PO TABS
6.2500 mg | ORAL_TABLET | Freq: Two times a day (BID) | ORAL | Status: DC
  Administered 2015-05-26 – 2015-05-29 (×6): 6.25 mg via ORAL
  Filled 2015-05-26 (×6): qty 1

## 2015-05-26 MED ORDER — MAGNESIUM HYDROXIDE 400 MG/5ML PO SUSP: 30.0000 mL | Freq: Every day | ORAL | Status: DC | PRN

## 2015-05-26 MED ORDER — ALBUTEROL SULFATE (2.5 MG/3ML) 0.083% IN NEBU
2.5000 mg | INHALATION_SOLUTION | RESPIRATORY_TRACT | Status: DC | PRN
  Administered 2015-05-26: 2.5 mg via RESPIRATORY_TRACT
  Filled 2015-05-26 (×2): qty 3

## 2015-05-26 MED ORDER — LACTULOSE 10 GM/15ML PO SOLN: 20.0000 g | Freq: Every day | ORAL | Status: DC | PRN

## 2015-05-26 MED ORDER — GLIMEPIRIDE 4 MG PO TABS
2.0000 mg | ORAL_TABLET | Freq: Every day | ORAL | Status: DC
  Administered 2015-05-27 – 2015-05-29 (×3): 2 mg via ORAL
  Filled 2015-05-26 (×3): qty 1

## 2015-05-26 NOTE — Care Management Note (Signed)
Case Management Note  Patient Details  Name: Bradley [name] MRN: [id number] Date of Birth: 1945/09/07  Subjective/Objective:                   Per 05/24/15  Operative note: patient has coronary occlusive disease with positive stress test with the following .    SURGICAL PROCEDURE: Coronary artery bypass grafting x3 with the left internal mammary to the left anterior descending coronary artery, reverse saphenous vein graft to the diagonal coronary artery, reverse saphenous vein graft to the posterior descending coronary artery with right thigh greater saphenous vein harvested endoscopically.  Spoke with patient via hospital room phone, verified name, date of birth and address.   Advised patient of CM role in discharge planning.  Patient voiced understanding and did not have any questions / concerns at this time.   Patient with loud and audible wheezing.   Patient states he is doing fine and having a lot wheezing and shortness of breath.   States his nurse is aware of his wheezing, shortness of breath, and just came into the room to check on him.   Patient currently has the following equipment at home: walker, bedside commode / 3-in 1, and shower chair.      Action/Plan:  Unit RNCM will continue to follow up for further discharge planning needs.   Patient has no current discharge planning needs.    Expected Discharge Date:                  Expected Discharge Plan:  [place]  In-House Referral:     Discharge planning Services  CM Consult  Post Acute Care Choice:  Home Health Choice offered to:  Patient (patient has had home health in the past, does not remember the name of the agency, agreeable to receiving a list of providers)  Per patient's wife, [facility] provided home health services in the past.  DME Arranged:    DME Agency:     HH Arranged:    [place] Agency:     Status of Service:  In process, will continue to follow  Medicare Important Message  Given:    Date Medicare IM Given:    Medicare IM give by:    Date Additional Medicare IM Given:  05/26/15 Additional Medicare Important Message give by:  Colbert Coyer. Burt Knack Therapist, sports, BSN, CCM If discussed at [employer], dates discussed:    Additional Comments:  Update: Spoke with patient and his wife in hospital room.   Patient gave permission for [facility] to speak with wife regarding his care and discharge needs.  Wife given a copy patient's  Important Message from [facility] and a list of home health agencies for [facility], per patient's request.   Wife states they would like to use [place] if patient needs home health upon discharge.     Lashan Macias H. Annia Friendly, BSN, [facility]   05/26/2015, 1:28 PM

## 2015-05-26 NOTE — Progress Notes (Signed)
Pt assisted to ambulate in hall, approx 500 ft using RW on room air.  No complaints.  Audible wheezing.  Slow steady gait.  To recliner after walk with call bell in reach and wife at side.  Will request PRN breathing treatment.

## 2015-05-26 NOTE — Progress Notes (Signed)
CARDIAC REHAB PHASE I   PRE:  Rate/Rhythm: 91 sinus rhythm  BP:  Supine:   Sitting: 150/80  Standing:    SaO2: 96% MODE:  Ambulation: 350  ft   POST:  Rate/Rhythem: 83 sinus rhythm  BP:  Supine:   Sitting: 147/76  Standing:    SaO2: 96% ra  1135-1220  Pt ambulated in hallway x 1 assist using rolling walker and gaitbelt.  Slow steady gait.  However pt did have difficulty standing from chair which required 2 person assist.   Pt returned to chair, call light in reach, family at bedside.  Pt audible wheeze improved post ambulation. Pt encouraged to increase pulmonary hygiene, cough deep breath and IS use.  Understanding verbalized.    [place], [place]

## 2015-05-26 NOTE — Progress Notes (Addendum)
       [address]       [place] [address]             [phone]          3 Days Post-Op Procedure(s) (LRB): CORONARY ARTERY BYPASS GRAFTING (CABG) x 3 (LIMA to LAD, SVG to DIAGONAL 2, SVG to PDA) with Endoscopic Vein Harvesting from right greater saphenous vein (N/A) TRANSESOPHAGEAL ECHOCARDIOGRAM (TEE) (N/A)  Subjective: Feels a little better today. Passing some flatus, no BM yet. Tolerated soft diet. AF overnight, back in SR on IV Amio.   Objective: Vital signs in last 24 hours: Patient Vitals for the past 24 hrs:  BP Temp Temp src Pulse Resp SpO2 Weight  05/26/15 0523 118/80 mmHg 97.6 F (36.4 C) Axillary 98 18 98 % 224 lb 9.6 oz (101.878 kg)  05/26/15 0208 (!) 150/108 mmHg - - - - - -  05/25/15 2142 - - - (!) 142 17 95 % -  05/25/15 2001 (!) 148/79 mmHg - - (!) 103 16 94 % -  05/25/15 1730 (!) 149/85 mmHg - - (!) 102 - - -  05/25/15 1257 (!) 148/71 mmHg 97.4 F (36.3 C) Oral - 16 95 % -  05/25/15 1200 (!) 140/93 mmHg 97.4 F (36.3 C) Oral 93 14 98 % -  05/25/15 1100 (!) 158/83 mmHg - Axillary 91 15 99 % -  05/25/15 1000 126/74 mmHg - - 88 13 98 % -   Current Weight  05/26/15 224 lb 9.6 oz (101.878 kg)  BASELINE WEIGHT: 95 kg   Intake/Output from previous day: 10/14 0701 - 10/15 0700 In: 340 [P.O.:300; I.V.:40] Out: 985 [Urine:985]  CBGs 704 413 4324   PHYSICAL EXAM:  Heart: RRR Lungs: Few crackles in bases Wound: Clean and dry Abdomen: Firm, somewhat distended, nontender, +BS Extremities: Mild LE edema    Lab Results: CBC: Recent Labs  05/25/15 0500 05/26/15 0415  WBC 16.3* 15.1*  HGB 10.4* 10.7*  HCT 31.1* 31.4*  PLT 168 191   BMET:  Recent Labs  05/25/15 0500 05/26/15 0415  NA 131* 131*  K 4.2 3.6  CL 100* 97*  CO2 24 23  GLUCOSE 137* 135*  BUN 18 17  CREATININE 1.72* 1.68*  CALCIUM 8.1* 8.4*    PT/INR:  Recent Labs  05/23/15 1440  LABPROT 18.3*  INR 1.51*      Assessment/Plan: S/P Procedure(s)  (LRB): CORONARY ARTERY BYPASS GRAFTING (CABG) x 3 (LIMA to LAD, SVG to DIAGONAL 2, SVG to PDA) with Endoscopic Vein Harvesting from right greater saphenous vein (N/A) TRANSESOPHAGEAL ECHOCARDIOGRAM (TEE) (N/A)  CV- AF, now back in SR. Continue IV Amio for now. BPs trending up, will increase Coreg. Not on ACE-I/ARB yet due to elevated creatinine.  Vol overload- diurese.  CKD- Cr generally stable, trending down. Baseline 1.4  Mild ileus- passing flatus, no Bm yet. Tolerating diet. LOC today and watch.   DM- sugars stable on Levemir. Resume Amaryl, not on Metformin yet due to elevated creatinine.  Mobilize, continue pulm toilet/IS, will add flutter valve.   LOS: 3 days    COLLINS,GINA H 05/26/2015  Patient seen and examined agree with above. Revonda Standard Roxan Hockey, MD [facility] [phone]

## 2015-05-27 LAB — BASIC METABOLIC PANEL
Anion gap: 13 (ref 5–15)
BUN: 19 mg/dL (ref 6–20)
CO2: 25 mmol/L (ref 22–32)
Calcium: 9 mg/dL (ref 8.9–10.3)
Chloride: 97 mmol/L — ABNORMAL LOW (ref 101–111)
Creatinine, Ser: 1.5 mg/dL — ABNORMAL HIGH (ref 0.61–1.24)
GFR calc Af Amer: 52 mL/min — ABNORMAL LOW (ref >60)
GFR calc non Af Amer: 45 mL/min — ABNORMAL LOW (ref >60)
Glucose, Bld: 128 mg/dL — ABNORMAL HIGH (ref 65–99)
Potassium: 3.2 mmol/L — ABNORMAL LOW (ref 3.5–5.1)
Sodium: 135 mmol/L (ref 135–145)

## 2015-05-27 LAB — GLUCOSE, CAPILLARY
Glucose-Capillary: 135 mg/dL — ABNORMAL HIGH (ref 65–99)
Glucose-Capillary: 263 mg/dL — ABNORMAL HIGH (ref 65–99)
Glucose-Capillary: 175 mg/dL — ABNORMAL HIGH (ref 65–99)
Glucose-Capillary: 212 mg/dL — ABNORMAL HIGH (ref 65–99)

## 2015-05-27 MED ORDER — AMIODARONE LOAD VIA INFUSION
150.0000 mg | Freq: Once | INTRAVENOUS | Status: AC
  Administered 2015-05-27: 150 mg via INTRAVENOUS
  Filled 2015-05-27: qty 83.34

## 2015-05-27 MED ORDER — POTASSIUM CHLORIDE CRYS ER 20 MEQ PO TBCR
40.0000 meq | EXTENDED_RELEASE_TABLET | Freq: Once | ORAL | Status: AC
  Administered 2015-05-27: 40 meq via ORAL
  Filled 2015-05-27: qty 2

## 2015-05-27 MED ORDER — AMLODIPINE BESYLATE 5 MG PO TABS
5.0000 mg | ORAL_TABLET | Freq: Every day | ORAL | Status: DC
  Administered 2015-05-27 – 2015-05-28 (×2): 5 mg via ORAL
  Filled 2015-05-27 (×2): qty 1

## 2015-05-27 MED ORDER — POTASSIUM CHLORIDE CRYS ER 20 MEQ PO TBCR
20.0000 meq | EXTENDED_RELEASE_TABLET | Freq: Every day | ORAL | Status: DC
  Administered 2015-05-28 – 2015-05-29 (×2): 20 meq via ORAL
  Filled 2015-05-27 (×2): qty 1

## 2015-05-27 MED ORDER — LEVALBUTEROL HCL 0.63 MG/3ML IN NEBU: 0.6300 mg | INHALATION_SOLUTION | Freq: Three times a day (TID) | RESPIRATORY_TRACT | Status: DC | PRN

## 2015-05-27 MED ORDER — AMIODARONE IV BOLUS ONLY 150 MG/100ML: 150.0000 mg | Freq: Once | INTRAVENOUS | Status: DC

## 2015-05-27 NOTE — Progress Notes (Signed)
Pt HR up to 140's after trip to bathroom.  Assisted to recliner.  Scheduled BB given.  Amio gtt continues.  Pt with no new complaints other than poor night of sleep.  Will con't to monitor.

## 2015-05-27 NOTE — Progress Notes (Signed)
Pt maintaining SR for last hour with HR 90's.  Pt assisted to ambulate 500 ft using RW.  Required coaching and mod assist for sit to stand, but steady once moving.  No complaints during walk, no tele alarms.  To bathroom after walk.  Will con't plan of  Care.

## 2015-05-27 NOTE — Progress Notes (Signed)
Pt ambulated another 500 ft with NT.  Back to recliner for dinner after walk.

## 2015-05-27 NOTE — Progress Notes (Addendum)
       [address]       [place] [address]             [phone]          4 Days Post-Op Procedure(s) (LRB): CORONARY ARTERY BYPASS GRAFTING (CABG) x 3 (LIMA to LAD, SVG to DIAGONAL 2, SVG to PDA) with Endoscopic Vein Harvesting from right greater saphenous vein (N/A) TRANSESOPHAGEAL ECHOCARDIOGRAM (TEE) (N/A)  Subjective: Finally had a BM. Nausea resolved. Back in AF this am after getting up to walk, rates 130s.   Objective: Vital signs in last 24 hours: Patient Vitals for the past 24 hrs:  BP Temp Temp src Pulse Resp SpO2 Weight  05/27/15 0442 (!) 165/70 mmHg 98.2 F (36.8 C) Oral 85 20 97 % 217 lb 8 oz (98.657 kg)  05/26/15 1954 131/73 mmHg 98.3 F (36.8 C) Oral 84 20 97 % -  05/26/15 1528 (!) 168/91 mmHg 97.7 F (36.5 C) Axillary 96 20 97 % -   Current Weight  05/27/15 217 lb 8 oz (98.657 kg)  BASELINE WEIGHT: 95 kg   Intake/Output from previous day: 10/15 0701 - 10/16 0700 In: 240 [P.O.:240] Out: -   CBGs [id number]   PHYSICAL EXAM:  Heart: Irr irr Lungs: Clear Wound: Clean and dry Abdomen: Soft, NT/ND, +BS Extremities: Mild LE edema    Lab Results: CBC: Recent Labs  05/25/15 0500 05/26/15 0415  WBC 16.3* 15.1*  HGB 10.4* 10.7*  HCT 31.1* 31.4*  PLT 168 191   BMET:  Recent Labs  05/26/15 0415 05/27/15 0410  NA 131* 135  K 3.6 3.2*  CL 97* 97*  CO2 23 25  GLUCOSE 135* 128*  BUN 17 19  CREATININE 1.68* 1.50*  CALCIUM 8.4* 9.0    PT/INR: No results for input(s): LABPROT, INR in the last 72 hours.    Assessment/Plan: S/P Procedure(s) (LRB): CORONARY ARTERY BYPASS GRAFTING (CABG) x 3 (LIMA to LAD, SVG to DIAGONAL 2, SVG to PDA) with Endoscopic Vein Harvesting from right greater saphenous vein (N/A) TRANSESOPHAGEAL ECHOCARDIOGRAM (TEE) (N/A)  CV- Back in AF, still on IV Amio. Will rebolus Amio, Coreg increased yesterday. BPs elevated, not on ACE-I due to increased Cr so will add Norvasc for HTN.  CKD- Cr  slowly trending down, baseline 1.4  Ileus- resolving. Bowels working, tolerating diet.  DM- CBGs stable on Levemir, Amaryl. Not on Metformin due to renal function.  Continue ambulation, pulm toilet.  Hypokalemia- replace K.    LOS: 4 days    COLLINS,GINA H 05/27/2015  Patient seen and examined, agree with above Fix hypokalemia Continue IV amiodarone  Remo Lipps C. Roxan Hockey, MD [facility] [phone]

## 2015-05-27 NOTE — Progress Notes (Signed)
Utilization review completed.  

## 2015-05-28 LAB — GLUCOSE, CAPILLARY
Glucose-Capillary: 92 mg/dL (ref 65–99)
Glucose-Capillary: 104 mg/dL — ABNORMAL HIGH (ref 65–99)
Glucose-Capillary: 221 mg/dL — ABNORMAL HIGH (ref 65–99)
Glucose-Capillary: 99 mg/dL (ref 65–99)
Glucose-Capillary: 153 mg/dL — ABNORMAL HIGH (ref 65–99)

## 2015-05-28 LAB — BASIC METABOLIC PANEL
Anion gap: 11 (ref 5–15)
BUN: 19 mg/dL (ref 6–20)
CO2: 28 mmol/L (ref 22–32)
Calcium: 8.8 mg/dL — ABNORMAL LOW (ref 8.9–10.3)
Chloride: 97 mmol/L — ABNORMAL LOW (ref 101–111)
Creatinine, Ser: 1.58 mg/dL — ABNORMAL HIGH (ref 0.61–1.24)
GFR calc Af Amer: 49 mL/min — ABNORMAL LOW (ref >60)
GFR calc non Af Amer: 42 mL/min — ABNORMAL LOW (ref >60)
Glucose, Bld: 80 mg/dL (ref 65–99)
Potassium: 3.6 mmol/L (ref 3.5–5.1)
Sodium: 136 mmol/L (ref 135–145)

## 2015-05-28 MED ORDER — POTASSIUM CHLORIDE CRYS ER 20 MEQ PO TBCR
40.0000 meq | EXTENDED_RELEASE_TABLET | Freq: Once | ORAL | Status: AC
  Administered 2015-05-28: 40 meq via ORAL
  Filled 2015-05-28: qty 2

## 2015-05-28 MED ORDER — AMIODARONE HCL 200 MG PO TABS
400.0000 mg | ORAL_TABLET | Freq: Two times a day (BID) | ORAL | Status: DC
  Administered 2015-05-28 – 2015-05-29 (×3): 400 mg via ORAL
  Filled 2015-05-28 (×3): qty 2

## 2015-05-28 NOTE — Progress Notes (Addendum)
[address]       [place] [address]             [phone]        5 Days Post-Op Procedure(s) (LRB): CORONARY ARTERY BYPASS GRAFTING (CABG) x 3 (LIMA to LAD, SVG to DIAGONAL 2, SVG to PDA) with Endoscopic Vein Harvesting from right greater saphenous vein (N/A) TRANSESOPHAGEAL ECHOCARDIOGRAM (TEE) (N/A)  Subjective: Patient's only complaint is wish he could sleep better.  Objective: Vital signs in last 24 hours: Temp:  [97.8 F (36.6 C)-98.2 F (36.8 C)] 98.2 F (36.8 C) (10/17 0453) Pulse Rate:  [85-92] 92 (10/17 0453) Cardiac Rhythm:  [-] Normal sinus rhythm (10/16 1930) Resp:  [20] 20 (10/17 0453) BP: (124-137)/(60-67) 137/67 mmHg (10/17 0453) SpO2:  [97 %-98 %] 98 % (10/17 0453) Weight:  [218 lb 1.6 oz (98.93 kg)] 218 lb 1.6 oz (98.93 kg) (10/17 0453)  Pre op weight 95 kg Current Weight  05/28/15 218 lb 1.6 oz (98.93 kg)   Intake/Output from previous day: 10/16 0701 - 10/17 0700 In: -  Out: 1200 [Urine:1200]   Physical Exam:  Cardiovascular: RRR Pulmonary: Slightly diminished at bases; no rales, wheezes, or rhonchi. Abdomen: Soft, non tender, bowel sounds present. Extremities: Mild bilateral lower extremity edema. Wounds: Clean and dry.  No erythema or signs of infection.  Lab Results: CBC: Recent Labs  05/26/15 0415  WBC 15.1*  HGB 10.7*  HCT 31.4*  PLT 191   BMET:  Recent Labs  05/27/15 0410 05/28/15 0237  NA 135 136  K 3.2* 3.6  CL 97* 97*  CO2 25 28  GLUCOSE 128* 80  BUN 19 19  CREATININE 1.50* 1.58*  CALCIUM 9.0 8.8*    PT/INR:  Lab Results  Component Value Date   INR 1.51* 05/23/2015   INR 1.01 05/21/2015   INR 1.0 05/11/2015   ABG:  INR: Will add last result for INR, ABG once components are confirmed Will add last 4 CBG results once components are confirmed  Assessment/Plan:  1. CV - PAF. SR in the 90's this am. On Amiodarone drip, Norvasc 5 mg daily, Coreg 6.25 mg bid. Will change Amiodarone to  oral daily. Not on ACE secondary to elevated creatinine. 2.  Pulmonary - On room air. 3. Volume Overload - On Lasix 40 mg daily 4.  Acute blood loss anemia - Last H and H 10.7 and 31.4. On Fergon. 5.DM-CBGs 175/212/104. On Insulin and Amaryl. No on Met formin as creatinine remains slightly elevated. Pre op HGA1C 8.1. Will need medical follow up after discharge. 6. Supplement potassium 7. Creatinine 1.58 this am. Will re check in am 8. Remove EPW in am  ZIMMERMAN,DONIELLE MPA-C 05/28/2015,7:57 AM  Cr at baseline  Holding sinus today Poss d/c in am I have seen and examined Bradley [name] and agree with the above assessment  and plan.  Grace Isaac MD Beeper [phone] Office [phone] 05/28/2015 10:47 PM

## 2015-05-28 NOTE — Progress Notes (Signed)
   05/28/15 1400  Mobility  Activity Ambulate in hall  Level of Assistance Standby assist, set-up cues, supervision of patient - no hands on  Assistive Device Front wheel walker  Distance Ambulated (ft) 1300 ft  Ambulation Response Tolerated well  Bed Position Chair  Pt seen ambulating in hall with wife. Steady pace. States he "feels good."

## 2015-05-28 NOTE — Care Management Important Message (Signed)
Important Message  Patient Details  Name: Bradley [name] MRN: [id number] Date of Birth: 1945-10-28   Medicare Important Message Given:  Yes-second notification given    [name] May 05/28/2015, 11:49 AM

## 2015-05-28 NOTE — Progress Notes (Signed)
CARDIAC REHAB PHASE I   PRE:  Rate/Rhythm: 92 SR with PACs    BP: sitting 134/75    SaO2: 94 RA  MODE:  Ambulation: 700 ft   POST:  Rate/Rhythm: 107 ST with PACs    BP: sitting 122/74     SaO2: 97 RA  Pt with appropriate mechanics to stand, min assist with gait belt. Steady walking with RW. Rest x1. Some wheezing at times. SaO2 difficult to register after walk but seems to be in high 90s. Return to recliner. Slight increase in PACs walking, no afib. Will f/u. [id number]  Bradley [name] [place] CES, ACSM 05/28/2015 10:23 AM

## 2015-05-29 ENCOUNTER — Emergency Department (HOSPITAL_COMMUNITY): Payer: PPO

## 2015-05-29 ENCOUNTER — Encounter (HOSPITAL_COMMUNITY): Payer: Self-pay | Admitting: Emergency Medicine

## 2015-05-29 ENCOUNTER — Inpatient Hospital Stay (HOSPITAL_COMMUNITY)
Admission: EM | Admit: 2015-05-29 | Discharge: 2015-06-04 | Disposition: A | Discharge status: Home / Self Care | Payer: PPO | Source: Home / Self Care | Attending: Cardiothoracic Surgery | Admitting: Cardiothoracic Surgery

## 2015-05-29 DIAGNOSIS — J90 Pleural effusion, not elsewhere classified: Secondary | ICD-10-CM

## 2015-05-29 DIAGNOSIS — I4891 Unspecified atrial fibrillation: Secondary | ICD-10-CM | POA: Diagnosis present

## 2015-05-29 DIAGNOSIS — E1159 Type 2 diabetes mellitus with other circulatory complications: Secondary | ICD-10-CM

## 2015-05-29 DIAGNOSIS — E1169 Type 2 diabetes mellitus with other specified complication: Secondary | ICD-10-CM | POA: Diagnosis present

## 2015-05-29 DIAGNOSIS — I25709 Atherosclerosis of coronary artery bypass graft(s), unspecified, with unspecified angina pectoris: Secondary | ICD-10-CM | POA: Insufficient documentation

## 2015-05-29 DIAGNOSIS — Z9989 Dependence on other enabling machines and devices: Secondary | ICD-10-CM

## 2015-05-29 DIAGNOSIS — I480 Paroxysmal atrial fibrillation: Secondary | ICD-10-CM | POA: Insufficient documentation

## 2015-05-29 DIAGNOSIS — G4733 Obstructive sleep apnea (adult) (pediatric): Secondary | ICD-10-CM | POA: Diagnosis present

## 2015-05-29 DIAGNOSIS — R079 Chest pain, unspecified: Secondary | ICD-10-CM

## 2015-05-29 DIAGNOSIS — Z9889 Other specified postprocedural states: Secondary | ICD-10-CM

## 2015-05-29 DIAGNOSIS — E785 Hyperlipidemia, unspecified: Secondary | ICD-10-CM | POA: Diagnosis present

## 2015-05-29 DIAGNOSIS — I10 Essential (primary) hypertension: Secondary | ICD-10-CM | POA: Diagnosis present

## 2015-05-29 LAB — BASIC METABOLIC PANEL
Anion gap: 11 (ref 5–15)
Anion gap: 12 (ref 5–15)
BUN: 20 mg/dL (ref 6–20)
BUN: 23 mg/dL — ABNORMAL HIGH (ref 6–20)
CO2: 25 mmol/L (ref 22–32)
CO2: 21 mmol/L — ABNORMAL LOW (ref 22–32)
Calcium: 8.7 mg/dL — ABNORMAL LOW (ref 8.9–10.3)
Calcium: 8.4 mg/dL — ABNORMAL LOW (ref 8.9–10.3)
Chloride: 99 mmol/L — ABNORMAL LOW (ref 101–111)
Chloride: 97 mmol/L — ABNORMAL LOW (ref 101–111)
Creatinine, Ser: 1.64 mg/dL — ABNORMAL HIGH (ref 0.61–1.24)
Creatinine, Ser: 1.53 mg/dL — ABNORMAL HIGH (ref 0.61–1.24)
GFR calc Af Amer: 47 mL/min — ABNORMAL LOW (ref >60)
GFR calc Af Amer: 51 mL/min — ABNORMAL LOW (ref >60)
GFR calc non Af Amer: 40 mL/min — ABNORMAL LOW (ref >60)
GFR calc non Af Amer: 44 mL/min — ABNORMAL LOW (ref >60)
Glucose, Bld: 114 mg/dL — ABNORMAL HIGH (ref 65–99)
Glucose, Bld: 153 mg/dL — ABNORMAL HIGH (ref 65–99)
Potassium: 3.4 mmol/L — ABNORMAL LOW (ref 3.5–5.1)
Potassium: 4.7 mmol/L (ref 3.5–5.1)
Sodium: 135 mmol/L (ref 135–145)
Sodium: 130 mmol/L — ABNORMAL LOW (ref 135–145)

## 2015-05-29 LAB — CBC
HCT: 30.4 % — ABNORMAL LOW (ref 39.0–52.0)
Hemoglobin: 10.3 g/dL — ABNORMAL LOW (ref 13.0–17.0)
MCH: 32.3 pg (ref 26.0–34.0)
MCHC: 33.9 g/dL (ref 30.0–36.0)
MCV: 95.3 fL (ref 78.0–100.0)
Platelets: 294 10*3/uL (ref 150–400)
RBC: 3.19 MIL/uL — ABNORMAL LOW (ref 4.22–5.81)
RDW: 13.3 % (ref 11.5–15.5)
WBC: 16.3 10*3/uL — ABNORMAL HIGH (ref 4.0–10.5)

## 2015-05-29 LAB — GLUCOSE, CAPILLARY
Glucose-Capillary: 119 mg/dL — ABNORMAL HIGH (ref 65–99)
Glucose-Capillary: 184 mg/dL — ABNORMAL HIGH (ref 65–99)

## 2015-05-29 LAB — I-STAT TROPONIN, ED: Troponin i, poc: 0.1 ng/mL (ref 0.00–0.08)

## 2015-05-29 LAB — PROTIME-INR
INR: 1.15 (ref 0.00–1.49)
Prothrombin Time: 14.9 seconds (ref 11.6–15.2)

## 2015-05-29 MED ORDER — INSULIN DETEMIR 100 UNIT/ML ~~LOC~~ SOLN
12.0000 [IU] | Freq: Every day | SUBCUTANEOUS | Status: DC
  Administered 2015-05-29: 12 [IU] via SUBCUTANEOUS
  Filled 2015-05-29: qty 0.12

## 2015-05-29 MED ORDER — TRAMADOL HCL 50 MG PO TABS: 50.0000 mg | ORAL_TABLET | Freq: Four times a day (QID) | ORAL | Status: DC | PRN

## 2015-05-29 MED ORDER — CARVEDILOL 6.25 MG PO TABS: 6.2500 mg | ORAL_TABLET | Freq: Two times a day (BID) | ORAL | Status: DC

## 2015-05-29 MED ORDER — AMLODIPINE BESYLATE 10 MG PO TABS: 10.0000 mg | ORAL_TABLET | Freq: Every day | ORAL | Status: DC

## 2015-05-29 MED ORDER — POTASSIUM CHLORIDE CRYS ER 20 MEQ PO TBCR
40.0000 meq | EXTENDED_RELEASE_TABLET | Freq: Once | ORAL | Status: AC
  Administered 2015-05-29: 40 meq via ORAL
  Filled 2015-05-29: qty 2

## 2015-05-29 MED ORDER — NITROGLYCERIN 0.4 MG SL SUBL
0.4000 mg | SUBLINGUAL_TABLET | SUBLINGUAL | Status: DC | PRN
  Administered 2015-05-29: 0.4 mg via SUBLINGUAL

## 2015-05-29 MED ORDER — ASPIRIN 81 MG PO CHEW
324.0000 mg | CHEWABLE_TABLET | Freq: Once | ORAL | Status: AC
  Administered 2015-05-29: 324 mg via ORAL
  Filled 2015-05-29: qty 4

## 2015-05-29 MED ORDER — IOHEXOL 350 MG/ML SOLN
100.0000 mL | Freq: Once | INTRAVENOUS | Status: AC | PRN
  Administered 2015-05-29: 100 mL via INTRAVENOUS

## 2015-05-29 MED ORDER — AMIODARONE HCL 400 MG PO TABS: 400.0000 mg | ORAL_TABLET | Freq: Two times a day (BID) | ORAL | Status: DC

## 2015-05-29 MED ORDER — METFORMIN HCL ER 500 MG PO TB24: 1000.0000 mg | ORAL_TABLET | Freq: Two times a day (BID) | ORAL | Status: DC

## 2015-05-29 MED ORDER — ASPIRIN 325 MG PO TBEC: 325.0000 mg | DELAYED_RELEASE_TABLET | Freq: Every day | ORAL | Status: DC

## 2015-05-29 MED ORDER — AMLODIPINE BESYLATE 10 MG PO TABS
10.0000 mg | ORAL_TABLET | Freq: Every day | ORAL | Status: DC
  Administered 2015-05-29: 10 mg via ORAL
  Filled 2015-05-29: qty 1

## 2015-05-29 MED ORDER — IPRATROPIUM-ALBUTEROL 0.5-2.5 (3) MG/3ML IN SOLN
3.0000 mL | Freq: Once | RESPIRATORY_TRACT | Status: AC
  Administered 2015-05-29: 3 mL via RESPIRATORY_TRACT
  Filled 2015-05-29: qty 3

## 2015-05-29 NOTE — Progress Notes (Signed)
Pts epicardial pacing wires removed per protocol and per order. Tips intact. Vitals stable. Pt educated on need for q15 vitals, and bedrest for one hour. Pt tolerated procedure well. Will continue to monitor.

## 2015-05-29 NOTE — Progress Notes (Addendum)
[address]       [place] [address]             [phone]        6 Days Post-Op Procedure(s) (LRB): CORONARY ARTERY BYPASS GRAFTING (CABG) x 3 (LIMA to LAD, SVG to DIAGONAL 2, SVG to PDA) with Endoscopic Vein Harvesting from right greater saphenous vein (N/A) TRANSESOPHAGEAL ECHOCARDIOGRAM (TEE) (N/A)  Subjective: Patient sleeping this am and he was awakened. No specific complaints.  Objective: Vital signs in last 24 hours: Temp:  [97.5 F (36.4 C)-98.1 F (36.7 C)] 98.1 F (36.7 C) (10/18 0457) Pulse Rate:  [74-91] 91 (10/18 0457) Cardiac Rhythm:  [-] Normal sinus rhythm (10/18 0457) Resp:  [20] 20 (10/18 0457) BP: (130-146)/(64-77) 146/68 mmHg (10/18 0457) SpO2:  [95 %-97 %] 95 % (10/18 0457) Weight:  [217 lb 13 oz (98.8 kg)] 217 lb 13 oz (98.8 kg) (10/18 0506)  Pre op weight 95 kg Current Weight  05/29/15 217 lb 13 oz (98.8 kg)   Intake/Output from previous day: 10/17 0701 - 10/18 0700 In: 640 [P.O.:640] Out: 550 [Urine:550]   Physical Exam:  Cardiovascular: RRR Pulmonary: Slightly diminished at bases; no rales, wheezes, or rhonchi. Abdomen: Soft, non tender, bowel sounds present. Extremities: Mild bilateral lower extremity edema. Wounds: Clean and dry.  No erythema or signs of infection.  Lab Results: CBC:No results for input(s): WBC, HGB, HCT, PLT in the last 72 hours. BMET:   Recent Labs  05/28/15 0237 05/29/15 0226  NA 136 135  K 3.6 3.4*  CL 97* 99*  CO2 28 25  GLUCOSE 80 114*  BUN 19 20  CREATININE 1.58* 1.64*  CALCIUM 8.8* 8.7*    PT/INR:  Lab Results  Component Value Date   INR 1.51* 05/23/2015   INR 1.01 05/21/2015   INR 1.0 05/11/2015   ABG:  INR: Will add last result for INR, ABG once components are confirmed Will add last 4 CBG results once components are confirmed  Assessment/Plan:  1. CV - PAF. SR in the 90's this am. On Amiodarone 400 mg bid, Norvasc 5 mg daily, Coreg 6.25 mg bid. Systolic BP  mostly 568-616. Will increase Norvasc to 10 mg daily. Not on ACE secondary to elevated creatinine. 2.  Pulmonary - On room air. Encourage incentive spirometer 3. Volume Overload - On Lasix 40 mg daily 4.  Acute blood loss anemia - Last H and H 10.7 and 31.4. On Fergon. 5.DM-CBGs 99/153/119. On Insulin and Amaryl. No on Met formin as creatinine remains slightly elevated. Pre op HGA1C 8.1. Will need medical follow up after discharge. 6. Supplement potassium 7. Creatinine 1.64 this am. Will stop Lasix 8. Remove EPW  9. Per Dr. Servando Snare, will discharge  ZIMMERMAN,DONIELLE MPA-C 05/29/2015,7:31 AM   Patient seen , ambulating in hall well, holding sinus  I have seen and examined Bradley [name] and agree with the above assessment  and plan.  Grace Isaac MD Beeper [phone] Office [phone] 05/29/2015 12:15 PM

## 2015-05-29 NOTE — Progress Notes (Signed)
Pt chest tube sutures removed per order. Pt tolerated well. Steri-strips applied. Painted with Benzoin. Call bell and phone within reach. Will continue to monitor.

## 2015-05-29 NOTE — Consult Note (Signed)
Called by ED resident physician regarding patient who returned via EMS with right sided chest wall pain associated with dyspnea without hypoxemia.  Situation discussed and patient's EKG, CXR and lab work reviewed.  Patient described as being somewhat anxious and symptoms felt likely to be related to chest wall discomfort according to Dr. Roxanne Mins.  Although diagnosis seems unlikely, given the patient's acute presentation via EMS I have recommended CTA chest to r/o PE or aortic dissection.  Patient has CKD with creatinine that has remained at 1.5 over the past 5-6 days - probably should have gentle IV hydration.  Assuming that CTA negative he should be treated as chest pain observation overnight with serial EKG's and cardiac enzymes.  Will f/u in am.  Rexene Alberts, MD 05/29/2015 11:34 PM

## 2015-05-29 NOTE — ED Provider Notes (Signed)
CSN: [id number]     Arrival date & time 05/29/15  2105 History   First MD Initiated Contact with Patient 05/29/15 2136     Chief Complaint  Patient presents with  . Chest Pain   Patient is a 69 y.o. male presenting with chest pain.  Chest Pain Pain location:  R chest and substernal area Pain quality: aching and pressure   Pain radiates to the back: yes   Pain severity:  Moderate Onset quality:  Sudden Timing:  Sporadic Progression:  Improving Chronicity:  New Context: breathing and at rest   Context: no movement and no stress   Relieved by:  Nothing Worsened by:  Exertion Associated symptoms: cough, shortness of breath and weakness   Associated symptoms: no abdominal pain, no altered mental status, no anxiety, no back pain, no fatigue, no headache, no lower extremity edema, no nausea, no orthopnea and not vomiting    Past Medical History  Diagnosis Date  . Hyperlipidemia   . Hypertension   . Diabetes mellitus age 40  . Peripheral vascular disease ([place])   . Arthritis   . Reflux esophagitis   . Stroke [facility]) 1987    Right brain stroke  . Neuropathy ([place]) 2013  . Osteoporosis 2013  . Sleep apnea     uses CPAP  . GERD (gastroesophageal reflux disease)   . H/O hiatal hernia   . Irritable bowel syndrome (IBS)   . Myocardial infarction [facility])     silent inferior MI; patient denies MI history (03/17/13)   . Anemia   . Carotid artery occlusion   . Coronary artery disease     cath 05/17/2015 90% mid LAD, 80% ost D2, 75% mid RCA, 35% prox RCA. CT surgery consulted  . Shortness of breath dyspnea   . Diverticulitis   . Wears glasses    Past Surgical History  Procedure Laterality Date  . Carotid endarterectomy  1994 & redo 2001    Left  . Pr vein bypass graft,aorto-fem-pop  1998  . Iliac artery stent    . Fracture surgery  2013    Right   foo  t X's 2  . Tonsillectomy    . Posterior lumbar fusion  Aug. 14, 2014    Level 1  . Spine surgery  2014  . Cardiac catheterization  N/A 05/17/2015    Procedure: Left Heart Cath and Coronary Angiography;  Surgeon: Belva Crome, MD;  Location: [place] CV LAB;  Service: Cardiovascular;  Laterality: N/A;  . Colonoscopy w/ biopsies and polypectomy    . Coronary artery bypass graft N/A 05/23/2015    Procedure: CORONARY ARTERY BYPASS GRAFTING (CABG) x 3 (LIMA to LAD, SVG to DIAGONAL 2, SVG to PDA) with Endoscopic Vein Harvesting from right greater saphenous vein;  Surgeon: Grace Isaac, MD;  Location: [place];  Service: Open Heart Surgery;  Laterality: N/A;  . Tee without cardioversion N/A 05/23/2015    Procedure: TRANSESOPHAGEAL ECHOCARDIOGRAM (TEE);  Surgeon: Grace Isaac, MD;  Location: [place];  Service: Open Heart Surgery;  Laterality: N/A;   Family History  Problem Relation Age of Onset  . Cancer Mother 70    pancreatic  . Hyperlipidemia Mother   . Stroke Father 47  . Deep vein thrombosis Father   . Hyperlipidemia Father   . Hypertension Father    Social History  Substance Use Topics  . Smoking status: Former Smoker -- 1.00 packs/day for 40 years    Types: Cigarettes    Quit  date: 03/26/2009  . Smokeless tobacco: Never Used  . Alcohol Use: No    Review of Systems  Constitutional: Negative for fatigue.  Respiratory: Positive for cough and shortness of breath.   Cardiovascular: Positive for chest pain. Negative for orthopnea.  Gastrointestinal: Negative for nausea, vomiting and abdominal pain.  Musculoskeletal: Negative for back pain.  Neurological: Positive for weakness. Negative for headaches.  All other systems reviewed and are negative.  Allergies  Dilaudid cough; Hydrocodone; Oxycodone; Penicillins; and Sulfa drugs cross reactors  Home Medications   Prior to Admission medications   Medication Sig Start Date End Date Taking? Authorizing Provider  Ascorbic Acid (VITAMIN C) 100 MG tablet Take 100 mg by mouth daily.   Yes Historical Provider, MD  atorvastatin (LIPITOR) 40 MG tablet Take 40 mg  by mouth daily.     Yes Historical Provider, MD  Calcium Carbonate-Vitamin D (CALCIUM-D) 600-400 MG-UNIT TABS Take 1 tablet by mouth daily.    Yes Historical Provider, MD  Cholecalciferol (D-3-5) 5000 UNITS capsule Take 5,000 Units by mouth daily.   Yes Historical Provider, MD  dexlansoprazole (DEXILANT) 60 MG capsule Take 60 mg by mouth daily.   Yes Historical Provider, MD  glimepiride (AMARYL) 2 MG tablet Take 2 mg by mouth daily before breakfast.     Yes Historical Provider, MD  IRON, FERROUS GLUCONATE, PO Take 40 mg by mouth 2 (two) times daily.   Yes Historical Provider, MD  Liraglutide (VICTOZA [place]) Inject 1.8 mg into the skin daily.    Yes Historical Provider, MD  metFORMIN (GLUCOPHAGE-XR) 500 MG 24 hr tablet Take 2 tablets (1,000 mg total) by mouth 2 (two) times daily. 05/30/15  Yes Donielle Liston Alba, PA-C  Multiple Vitamin (MULTIVITAMIN) capsule Take 1 capsule by mouth daily.     Yes Historical Provider, MD  traMADol (ULTRAM) 50 MG tablet Take 1 tablet (50 mg total) by mouth every 6 (six) hours as needed for moderate pain. 05/29/15  Yes Donielle Liston Alba, PA-C  amiodarone (PACERONE) 400 MG tablet Take 1 tablet (400 mg total) by mouth 2 (two) times daily. For 3 days;then take Amiodarone 200 mg by mouth two times daily for one week. Finally, take Amiodarone 200 mg by mouth daily thereafter Patient not taking: Reported on 05/29/2015 05/29/15   Donielle Liston Alba, PA-C  amLODipine (NORVASC) 10 MG tablet Take 1 tablet (10 mg total) by mouth daily. Patient not taking: Reported on 05/29/2015 05/29/15   Donielle Liston Alba, PA-C  aspirin EC 325 MG EC tablet Take 1 tablet (325 mg total) by mouth daily. Patient not taking: Reported on 05/29/2015 05/29/15   Donielle Liston Alba, PA-C  carvedilol (COREG) 6.25 MG tablet Take 1 tablet (6.25 mg total) by mouth 2 (two) times daily with a meal. Patient not taking: Reported on 05/29/2015 05/29/15   Donielle Liston Alba, PA-C   BP 154/62 mmHg   Pulse 95  Temp(Src) 97.7 F (36.5 C) (Oral)  Resp 23  Ht 5\' 11"  (1.803 m)  Wt 218 lb (98.884 kg)  BMI 30.42 kg/m2  SpO2 96% Physical Exam  Constitutional: He is oriented to person, place, and time. He appears well-developed and well-nourished. No distress.  HENT:  Head: Normocephalic.  Eyes: Pupils are equal, round, and reactive to light.  Neck: Normal range of motion.  Cardiovascular: Normal rate.   Pulmonary/Chest: Effort normal. No respiratory distress.  Decrease air entry bilaterally with slight wheezing  Abdominal: Soft. He exhibits no distension. There is no tenderness.  Musculoskeletal: He exhibits  no edema.  Neurological: He is alert and oriented to person, place, and time.  Skin: Skin is warm. He is not diaphoretic. No erythema.  Well healing midline sternal chest wound.   Psychiatric: His behavior is normal. Thought content normal.  Nursing note and vitals reviewed.   ED Course  Procedures (including critical care time) Labs Review Labs Reviewed  BASIC METABOLIC PANEL - Abnormal; Notable for the following:    Sodium 130 (*)    Chloride 97 (*)    CO2 21 (*)    Glucose, Bld 153 (*)    BUN 23 (*)    Creatinine, Ser 1.53 (*)    Calcium 8.4 (*)    GFR calc non Af Amer 44 (*)    GFR calc Af Amer 51 (*)    All other components within normal limits  CBC - Abnormal; Notable for the following:    WBC 16.3 (*)    RBC 3.19 (*)    Hemoglobin 10.3 (*)    HCT 30.4 (*)    All other components within normal limits  PROTIME-INR  I-STAT TROPOININ, ED   Imaging Review Dg Chest 2 View  05/29/2015  CLINICAL DATA:  Severe shortness of breath and chest pain. Recent CABG 6 days prior. EXAM: CHEST  2 VIEW COMPARISON:  05/26/2015 FINDINGS: Patient is post median sternotomy. Heart at the upper limits normal in size. Small bilateral pleural effusions, decreased on the left and unchanged on the right. No pulmonary edema, focal consolidation or pneumothorax. The bones are under  mineralized. IMPRESSION: Post CABG with small bilateral pleural effusions, decreased on the left and unchanged on the right from prior. Electronically Signed   By: Jeb Levering M.D.   On: 05/29/2015 22:29   I have personally reviewed and evaluated these images and lab results as part of my medical decision-making.   EKG Interpretation   Date/Time:  Tuesday May 29 2015 21:14:57 EDT Ventricular Rate:  95 PR Interval:  149 QRS Duration: 124 QT Interval:  364 QTC Calculation: 458 R Axis:   -22 Text Interpretation:  Sinus rhythm Right bundle branch block Inferior  infarct, old Lateral leads are also involved When compared with ECG of  05/25/2015, Sinus rhythm has replaced Atrial fibrillation with rapid  ventricular response Confirmed by Roxanne Mins  MD, DAVID ([id number]) on 05/29/2015  9:17:25 PM      MDM    Patient presents emergency department today with right-sided chest pain was associated with shortness of breath, diaphoresis. Patient had open-heart surgery last week and was discharged from hospital today. Patient states he was sitting when this occurred. He currently is not having any chest pain.  He has minimal wheezing bilaterally with decreased air entry (no history of COPD) with no new oxygen requirement. He is still diaphoretic but in no distress at this time.  CT surgery about their recently discharged patient. Will proceed with CT-angiogram for PE rule out. Patient has slight leukocytosis. Patient given duoneb at this time.   Slightly elevated troponin but recent CABG so unclear if cardiac in nature. CT PE sho ws no acute embolism. Patient to be admitted for observation.   Final diagnoses:  Recent major surgery  Chest pain, unspecified chest pain type      Roberto Scales, MD [id number]  Delora Fuel, MD [id number]

## 2015-05-29 NOTE — Progress Notes (Signed)
Ed completed with pt. Wife came in at end. Voiced understanding and asked appropriate questions. Interested in [facility] and will send referral to [place]. [place], ACSM 1:58 PM 05/29/2015

## 2015-05-29 NOTE — ED Notes (Signed)
Pt in EMS from home, reports R and L sided CP that started around 6pm. Pt had triple bipass Oct 12 and was DC earlier today. Pt was pale and diaphoretic, wheezing present and spitting up flim. Given 125 solu-medrol en route. Given ASA this am, none en route

## 2015-05-29 NOTE — Progress Notes (Signed)
CARDIAC REHAB PHASE I   PRE:  Rate/Rhythm: 90 SR    BP: sitting 129/62    SaO2: 96 RA  MODE:  Ambulation: 850 ft   POST:  Rate/Rhythm: 98 SR    BP: sitting 141/72     SaO2: 93 RA  Pt doing well. Able to stand with min assist. Steady with RW. No ectopy. Some wheezing at times, esp when he laid down after walking. Wil f/u later with wife for ed. [phone]   Darrick Meigs CES, ACSM 05/29/2015 9:51 AM

## 2015-05-30 ENCOUNTER — Encounter (HOSPITAL_COMMUNITY): Payer: Self-pay | Admitting: Internal Medicine

## 2015-05-30 DIAGNOSIS — R079 Chest pain, unspecified: Secondary | ICD-10-CM | POA: Insufficient documentation

## 2015-05-30 DIAGNOSIS — G4733 Obstructive sleep apnea (adult) (pediatric): Secondary | ICD-10-CM | POA: Diagnosis present

## 2015-05-30 DIAGNOSIS — I4891 Unspecified atrial fibrillation: Secondary | ICD-10-CM | POA: Diagnosis present

## 2015-05-30 DIAGNOSIS — Z9989 Dependence on other enabling machines and devices: Secondary | ICD-10-CM

## 2015-05-30 DIAGNOSIS — E1159 Type 2 diabetes mellitus with other circulatory complications: Secondary | ICD-10-CM

## 2015-05-30 DIAGNOSIS — E785 Hyperlipidemia, unspecified: Secondary | ICD-10-CM

## 2015-05-30 DIAGNOSIS — I481 Persistent atrial fibrillation: Secondary | ICD-10-CM

## 2015-05-30 HISTORY — DX: Obstructive sleep apnea (adult) (pediatric): G47.33

## 2015-05-30 HISTORY — DX: Type 2 diabetes mellitus with other circulatory complications: E11.59

## 2015-05-30 HISTORY — DX: Dependence on other enabling machines and devices: Z99.89

## 2015-05-30 LAB — CBC WITH DIFFERENTIAL/PLATELET
Basophils Absolute: 0 10*3/uL (ref 0.0–0.1)
Basophils Relative: 0 %
Eosinophils Absolute: 0 10*3/uL (ref 0.0–0.7)
Eosinophils Relative: 0 %
HCT: 29.8 % — ABNORMAL LOW (ref 39.0–52.0)
Hemoglobin: 9.7 g/dL — ABNORMAL LOW (ref 13.0–17.0)
Lymphocytes Relative: 4 %
Lymphs Abs: 0.4 10*3/uL — ABNORMAL LOW (ref 0.7–4.0)
MCH: 31.2 pg (ref 26.0–34.0)
MCHC: 32.6 g/dL (ref 30.0–36.0)
MCV: 95.8 fL (ref 78.0–100.0)
Monocytes Absolute: 0.1 10*3/uL (ref 0.1–1.0)
Monocytes Relative: 1 %
Neutro Abs: 9.9 10*3/uL — ABNORMAL HIGH (ref 1.7–7.7)
Neutrophils Relative %: 95 %
Platelets: 272 10*3/uL (ref 150–400)
RBC: 3.11 MIL/uL — ABNORMAL LOW (ref 4.22–5.81)
RDW: 13.4 % (ref 11.5–15.5)
WBC: 10.4 10*3/uL (ref 4.0–10.5)

## 2015-05-30 LAB — GLUCOSE, CAPILLARY
Glucose-Capillary: 212 mg/dL — ABNORMAL HIGH (ref 65–99)
Glucose-Capillary: 314 mg/dL — ABNORMAL HIGH (ref 65–99)
Glucose-Capillary: 345 mg/dL — ABNORMAL HIGH (ref 65–99)
Glucose-Capillary: 188 mg/dL — ABNORMAL HIGH (ref 65–99)

## 2015-05-30 LAB — I-STAT TROPONIN, ED: Troponin i, poc: 0.05 ng/mL (ref 0.00–0.08)

## 2015-05-30 LAB — COMPREHENSIVE METABOLIC PANEL
ALT: 18 U/L (ref 17–63)
AST: 14 U/L — ABNORMAL LOW (ref 15–41)
Albumin: 2.2 g/dL — ABNORMAL LOW (ref 3.5–5.0)
Alkaline Phosphatase: 56 U/L (ref 38–126)
Anion gap: 12 (ref 5–15)
BUN: 24 mg/dL — ABNORMAL HIGH (ref 6–20)
CO2: 24 mmol/L (ref 22–32)
Calcium: 8.8 mg/dL — ABNORMAL LOW (ref 8.9–10.3)
Chloride: 99 mmol/L — ABNORMAL LOW (ref 101–111)
Creatinine, Ser: 1.59 mg/dL — ABNORMAL HIGH (ref 0.61–1.24)
GFR calc Af Amer: 49 mL/min — ABNORMAL LOW (ref >60)
GFR calc non Af Amer: 42 mL/min — ABNORMAL LOW (ref >60)
Glucose, Bld: 229 mg/dL — ABNORMAL HIGH (ref 65–99)
Potassium: 4.9 mmol/L (ref 3.5–5.1)
Sodium: 135 mmol/L (ref 135–145)
Total Bilirubin: 0.8 mg/dL (ref 0.3–1.2)
Total Protein: 5.8 g/dL — ABNORMAL LOW (ref 6.5–8.1)

## 2015-05-30 LAB — TROPONIN I
Troponin I: 0.04 ng/mL — ABNORMAL HIGH (ref <0.031)
Troponin I: 0.1 ng/mL — ABNORMAL HIGH (ref <0.031)
Troponin I: 0.03 ng/mL (ref <0.031)

## 2015-05-30 MED ORDER — TRAMADOL HCL 50 MG PO TABS
50.0000 mg | ORAL_TABLET | Freq: Four times a day (QID) | ORAL | Status: DC | PRN
  Administered 2015-05-31: 50 mg via ORAL
  Filled 2015-05-30: qty 1

## 2015-05-30 MED ORDER — VITAMIN C 100 MG PO TABS: 100.0000 mg | ORAL_TABLET | Freq: Every day | ORAL | Status: DC

## 2015-05-30 MED ORDER — SODIUM CHLORIDE 0.9 % IJ SOLN
3.0000 mL | Freq: Two times a day (BID) | INTRAMUSCULAR | Status: DC
  Administered 2015-05-30 – 2015-06-03 (×7): 3 mL via INTRAVENOUS

## 2015-05-30 MED ORDER — LEVALBUTEROL HCL 0.63 MG/3ML IN NEBU
0.6300 mg | INHALATION_SOLUTION | Freq: Three times a day (TID) | RESPIRATORY_TRACT | Status: DC | PRN
  Administered 2015-05-30: 0.63 mg via RESPIRATORY_TRACT
  Filled 2015-05-30: qty 3

## 2015-05-30 MED ORDER — ONDANSETRON HCL 4 MG/2ML IJ SOLN
4.0000 mg | Freq: Four times a day (QID) | INTRAMUSCULAR | Status: DC | PRN
Start: 2015-05-30 — End: 2015-06-04

## 2015-05-30 MED ORDER — INSULIN ASPART 100 UNIT/ML ~~LOC~~ SOLN
0.0000 [IU] | Freq: Three times a day (TID) | SUBCUTANEOUS | Status: DC
  Administered 2015-05-30: 3 [IU] via SUBCUTANEOUS
  Administered 2015-05-30: 7 [IU] via SUBCUTANEOUS

## 2015-05-30 MED ORDER — VITAMIN C 500 MG PO TABS
250.0000 mg | ORAL_TABLET | Freq: Every day | ORAL | Status: DC
  Administered 2015-05-30 – 2015-06-04 (×6): 250 mg via ORAL
  Filled 2015-05-30 (×6): qty 1

## 2015-05-30 MED ORDER — IRON (FERROUS GLUCONATE) 256 (28 FE) MG PO TABS: 40.0000 mg | ORAL_TABLET | Freq: Two times a day (BID) | ORAL | Status: DC

## 2015-05-30 MED ORDER — FUROSEMIDE 10 MG/ML IJ SOLN
40.0000 mg | Freq: Once | INTRAMUSCULAR | Status: AC
  Administered 2015-05-30: 40 mg via INTRAVENOUS
  Filled 2015-05-30: qty 4

## 2015-05-30 MED ORDER — METOPROLOL TARTRATE 25 MG PO TABS
25.0000 mg | ORAL_TABLET | Freq: Two times a day (BID) | ORAL | Status: DC
  Administered 2015-05-30 – 2015-06-03 (×9): 25 mg via ORAL
  Filled 2015-05-30 (×9): qty 1

## 2015-05-30 MED ORDER — INSULIN ASPART 100 UNIT/ML ~~LOC~~ SOLN
0.0000 [IU] | Freq: Every day | SUBCUTANEOUS | Status: DC
  Administered 2015-05-31: 2 [IU] via SUBCUTANEOUS
  Administered 2015-06-01: 3 [IU] via SUBCUTANEOUS
  Administered 2015-06-02: 2 [IU] via SUBCUTANEOUS

## 2015-05-30 MED ORDER — PANTOPRAZOLE SODIUM 40 MG PO TBEC
40.0000 mg | DELAYED_RELEASE_TABLET | Freq: Every day | ORAL | Status: DC
  Administered 2015-05-30 – 2015-06-04 (×6): 40 mg via ORAL
  Filled 2015-05-30 (×6): qty 1

## 2015-05-30 MED ORDER — ADULT MULTIVITAMIN W/MINERALS CH
1.0000 | ORAL_TABLET | Freq: Every day | ORAL | Status: DC
  Administered 2015-05-30 – 2015-06-04 (×6): 1 via ORAL
  Filled 2015-05-30 (×6): qty 1

## 2015-05-30 MED ORDER — CHOLECALCIFEROL 125 MCG (5000 UT) PO CAPS: 5000.0000 [IU] | ORAL_CAPSULE | Freq: Every day | ORAL | Status: DC

## 2015-05-30 MED ORDER — FERROUS GLUCONATE 324 (38 FE) MG PO TABS
324.0000 mg | ORAL_TABLET | Freq: Two times a day (BID) | ORAL | Status: DC
  Administered 2015-05-30 – 2015-06-04 (×11): 324 mg via ORAL
  Filled 2015-05-30 (×11): qty 1

## 2015-05-30 MED ORDER — DILTIAZEM HCL 100 MG IV SOLR
5.0000 mg/h | INTRAVENOUS | Status: DC
  Administered 2015-05-30: 5 mg/h via INTRAVENOUS
  Administered 2015-05-30 – 2015-05-31 (×2): 12.5 mg/h via INTRAVENOUS
  Filled 2015-05-30 (×4): qty 100

## 2015-05-30 MED ORDER — MULTIVITAMINS PO CAPS: 1.0000 | ORAL_CAPSULE | Freq: Every day | ORAL | Status: DC

## 2015-05-30 MED ORDER — INSULIN GLARGINE 100 UNIT/ML ~~LOC~~ SOLN
5.0000 [IU] | Freq: Every day | SUBCUTANEOUS | Status: DC
  Administered 2015-05-30 – 2015-06-01 (×3): 5 [IU] via SUBCUTANEOUS
  Filled 2015-05-30 (×3): qty 0.05

## 2015-05-30 MED ORDER — ENOXAPARIN SODIUM 40 MG/0.4ML ~~LOC~~ SOLN
40.0000 mg | SUBCUTANEOUS | Status: DC
  Administered 2015-05-30 – 2015-06-04 (×6): 40 mg via SUBCUTANEOUS
  Filled 2015-05-30 (×6): qty 0.4

## 2015-05-30 MED ORDER — ATORVASTATIN CALCIUM 40 MG PO TABS
40.0000 mg | ORAL_TABLET | Freq: Every day | ORAL | Status: DC
  Administered 2015-05-30 – 2015-06-03 (×5): 40 mg via ORAL
  Filled 2015-05-30 (×5): qty 1

## 2015-05-30 MED ORDER — VITAMIN D 1000 UNITS PO TABS
2000.0000 [IU] | ORAL_TABLET | Freq: Every day | ORAL | Status: DC
  Administered 2015-05-30 – 2015-06-04 (×6): 2000 [IU] via ORAL
  Filled 2015-05-30 (×6): qty 2

## 2015-05-30 MED ORDER — CALCIUM-D 600-400 MG-UNIT PO TABS: 1.0000 | ORAL_TABLET | Freq: Every day | ORAL | Status: DC

## 2015-05-30 MED ORDER — ACETAMINOPHEN 650 MG RE SUPP: 650.0000 mg | Freq: Four times a day (QID) | RECTAL | Status: DC | PRN

## 2015-05-30 MED ORDER — INSULIN ASPART 100 UNIT/ML ~~LOC~~ SOLN
0.0000 [IU] | Freq: Three times a day (TID) | SUBCUTANEOUS | Status: DC
  Administered 2015-05-30: 7 [IU] via SUBCUTANEOUS
  Administered 2015-05-31: 2 [IU] via SUBCUTANEOUS
  Administered 2015-05-31: 3 [IU] via SUBCUTANEOUS
  Administered 2015-05-31 – 2015-06-01 (×2): 2 [IU] via SUBCUTANEOUS
  Administered 2015-06-01: 3 [IU] via SUBCUTANEOUS
  Administered 2015-06-01 – 2015-06-02 (×2): 2 [IU] via SUBCUTANEOUS
  Administered 2015-06-02: 3 [IU] via SUBCUTANEOUS
  Administered 2015-06-03: 2 [IU] via SUBCUTANEOUS
  Administered 2015-06-03: 3 [IU] via SUBCUTANEOUS
  Administered 2015-06-03 – 2015-06-04 (×2): 2 [IU] via SUBCUTANEOUS

## 2015-05-30 MED ORDER — DILTIAZEM LOAD VIA INFUSION
10.0000 mg | Freq: Once | INTRAVENOUS | Status: AC
  Administered 2015-05-30: 10 mg via INTRAVENOUS
  Filled 2015-05-30: qty 10

## 2015-05-30 MED ORDER — CARVEDILOL 3.125 MG PO TABS: 3.1250 mg | ORAL_TABLET | Freq: Two times a day (BID) | ORAL | Status: DC

## 2015-05-30 MED ORDER — ONDANSETRON HCL 4 MG PO TABS: 4.0000 mg | ORAL_TABLET | Freq: Four times a day (QID) | ORAL | Status: DC | PRN

## 2015-05-30 MED ORDER — GLIMEPIRIDE 4 MG PO TABS
2.0000 mg | ORAL_TABLET | Freq: Every day | ORAL | Status: DC
  Administered 2015-05-30: 2 mg via ORAL
  Filled 2015-05-30: qty 1

## 2015-05-30 MED ORDER — CALCIUM CARBONATE-VITAMIN D 500-200 MG-UNIT PO TABS
1.0000 | ORAL_TABLET | Freq: Every day | ORAL | Status: DC
  Administered 2015-05-30 – 2015-06-04 (×6): 1 via ORAL
  Filled 2015-05-30 (×6): qty 1

## 2015-05-30 MED ORDER — ASPIRIN EC 81 MG PO TBEC
81.0000 mg | DELAYED_RELEASE_TABLET | Freq: Every day | ORAL | Status: DC
  Administered 2015-05-30 – 2015-06-04 (×6): 81 mg via ORAL
  Filled 2015-05-30 (×6): qty 1

## 2015-05-30 MED ORDER — ACETAMINOPHEN 325 MG PO TABS
650.0000 mg | ORAL_TABLET | Freq: Four times a day (QID) | ORAL | Status: DC | PRN
  Administered 2015-05-30: 650 mg via ORAL
  Filled 2015-05-30: qty 2

## 2015-05-30 NOTE — Progress Notes (Signed)
Inpatient Diabetes Program Recommendations  AACE/ADA: New Consensus Statement on Inpatient Glycemic Control (2015)  Target Ranges:  Prepandial:   less than 140 mg/dL      Peak postprandial:   less than 180 mg/dL (1-2 hours)      Critically ill patients:  140 - 180 mg/dL   Review of Glycemic Control  Diabetes history: DM2 Outpatient Diabetes medications: Amaryl 2 mg QD, metformin 1000 mg bid, Victoza 1.8 mg QD Current orders for Inpatient glycemic control: Amaryl 2 mg QD, Novolog sensitive tidwc  Results for Bradley [name], Bradley [name] (MRN [id number]) as of 05/30/2015 11:21  Ref. Range 05/29/2015 06:18 05/29/2015 11:52 05/30/2015 06:25  Glucose-Capillary Latest Ref Range: 65-99 mg/dL 119 (H) 184 (H) 212 (H)  Results for [name], [name] (MRN [id number]) as of 05/30/2015 11:21  Ref. Range 05/21/2015 13:33  Hemoglobin A1C Latest Ref Range: 4.8-5.6 % 8.1 (H)    Inpatient Diabetes Program Recommendations:     Consider addition of Levemir 10 units QHS.  Will continue to follow. Thank you. Lorenda Peck, RD, LDN, CDE Inpatient Diabetes Coordinator [phone]

## 2015-05-30 NOTE — Consult Note (Signed)
CARDIOLOGY CONSULT NOTE   Patient ID: Bradley [name] MRN: [id number] DOB/AGE: 71-Mar-1947 71 y.o.  Admit date: 05/29/2015  Primary Physician    Melinda Crutch, MD Primary Cardiologist   Dr. Tamala Julian Reason for Consultation   Afib with RVR  HPI: Bradley [name] is a 69 y.o. male with a history of CAD s/p CABG 05/23/15, HTN, HLD, DM2, OSA, chronic RBBB, prior CVA, carotid artery stenosis s/p L CEA 1994 with redo in 2001 and PAD s/p h/o aorta-bifem 1998 who was discharged yesterday 05/30/15 and presented after few hors  Presented 05/17/15 for outpatient cath due to abnormal Myoview. He underwent diagnostic cardiac catheterization on 05/17/2015 which showed 3v dx, 90% mid LAD, 80% ost D2, 75% mid RCA, normal EF. Post cath, CT surgery consult and CABG was planned for 05/23/2015 after pre-op doppler study and PFT. Discharged 05/19/15.   The patient was admitted to [facility] on 05/23/2015 for CABG x 3.    Left internal mammary artery to left anterior descending   Saphenous vein graft to diagonal   Saphenous vein graft to posterior descending  Endoscopic greater saphenous vein harvest right thigh  During admission he went into a fib with RVR and was put on an Amiodarone drip. He converted to sinus rhythm. He was discharged to home yesterday. He started having chest discomfort with something "pooing out" with palpitation while having dinner. He dose had diaphoresis.   He brought to ED by EMS to ED and given SL nitro and nebulizer for wheezing. CTCS is consulted and admitted overnight for observation. CXR showed small bilateral pleural effusions. CT chest without PE or aortic disecssion, however showed small bilateral pleural effusions with compressive atelectasis, small pericardial effusion.  EKG on presentation showed NSR with chronic RBBB. Troponin trend 0.04-->0.1. Creatinine of 1.53-->1.59. BUN of 24. Hemoglobin of 9.7.  Cardiology is consulted today as he went again into Afib with RVR this  morning. Currently he is doing well and has no complain.     Past Medical History  Diagnosis Date  . Hyperlipidemia   . Hypertension   . Diabetes mellitus age 29  . Peripheral vascular disease ([place])   . Arthritis   . Reflux esophagitis   . Stroke [facility]) 1987    Right brain stroke  . Neuropathy ([place]) 2013  . Osteoporosis 2013  . Sleep apnea     uses CPAP  . GERD (gastroesophageal reflux disease)   . H/O hiatal hernia   . Irritable bowel syndrome (IBS)   . Myocardial infarction [facility])     silent inferior MI; patient denies MI history (03/17/13)   . Anemia   . Carotid artery occlusion   . Coronary artery disease     cath 05/17/2015 90% mid LAD, 80% ost D2, 75% mid RCA, 35% prox RCA. CT surgery consulted  . Shortness of breath dyspnea   . Diverticulitis   . Wears glasses      Past Surgical History  Procedure Laterality Date  . Carotid endarterectomy  1994 & redo 2001    Left  . Pr vein bypass graft,aorto-fem-pop  1998  . Iliac artery stent    . Fracture surgery  2013    Right   foo  t X's 2  . Tonsillectomy    . Posterior lumbar fusion  Aug. 14, 2014    Level 1  . Spine surgery  2014  . Cardiac catheterization N/A 05/17/2015    Procedure: Left Heart Cath and Coronary Angiography;  Surgeon: Mallie Mussel  Nicholes Stairs, MD;  Location: [place] CV LAB;  Service: Cardiovascular;  Laterality: N/A;  . Colonoscopy w/ biopsies and polypectomy    . Coronary artery bypass graft N/A 05/23/2015    Procedure: CORONARY ARTERY BYPASS GRAFTING (CABG) x 3 (LIMA to LAD, SVG to DIAGONAL 2, SVG to PDA) with Endoscopic Vein Harvesting from right greater saphenous vein;  Surgeon: Grace Isaac, MD;  Location: [place];  Service: Open Heart Surgery;  Laterality: N/A;  . Tee without cardioversion N/A 05/23/2015    Procedure: TRANSESOPHAGEAL ECHOCARDIOGRAM (TEE);  Surgeon: Grace Isaac, MD;  Location: [place];  Service: Open Heart Surgery;  Laterality: N/A;    Allergies  Allergen Reactions  . Dilaudid  Cough Nausea Only and Cough  . Hydrocodone Nausea Only  . Oxycodone Nausea Only  . Penicillins Rash    Has patient had a PCN reaction causing immediate rash, facial/tongue/throat swelling, SOB or lightheadedness with hypotension: NO Has patient had a PCN reaction causing severe rash involving mucus membranes or skin necrosis:NO Has patient had a PCN reaction that required hospitalization NO Has patient had a PCN reaction occurring within the last 10 years: NO If all of the above answers are "NO", then may proceed with Cephalosporin use.   . Sulfa Drugs Cross Reactors Rash    I have reviewed the patient's current medications . aspirin EC  81 mg Oral Daily  . atorvastatin  40 mg Oral q1800  . calcium-vitamin D  1 tablet Oral Daily  . carvedilol  3.125 mg Oral BID WC  . cholecalciferol  2,000 Units Oral Daily  . enoxaparin (LOVENOX) injection  40 mg Subcutaneous Q24H  . ferrous gluconate  324 mg Oral BID WC  . glimepiride  2 mg Oral QAC breakfast  . insulin aspart  0-9 Units Subcutaneous TID WC  . multivitamin with minerals  1 tablet Oral Daily  . pantoprazole  40 mg Oral Daily  . sodium chloride  3 mL Intravenous Q12H  . vitamin C  250 mg Oral Daily   . diltiazem (CARDIZEM) infusion 10 mg/hr (05/30/15 1138)   acetaminophen **OR** acetaminophen, levalbuterol, nitroGLYCERIN, ondansetron **OR** ondansetron (ZOFRAN) IV, traMADol  Prior to Admission medications   Medication Sig Start Date End Date Taking? Authorizing Provider  Ascorbic Acid (VITAMIN C) 100 MG tablet Take 100 mg by mouth daily.   Yes Historical Provider, MD  atorvastatin (LIPITOR) 40 MG tablet Take 40 mg by mouth daily.     Yes Historical Provider, MD  Calcium Carbonate-Vitamin D (CALCIUM-D) 600-400 MG-UNIT TABS Take 1 tablet by mouth daily.    Yes Historical Provider, MD  Cholecalciferol (D-3-5) 5000 UNITS capsule Take 5,000 Units by mouth daily.   Yes Historical Provider, MD  dexlansoprazole (DEXILANT) 60 MG capsule  Take 60 mg by mouth daily.   Yes Historical Provider, MD  glimepiride (AMARYL) 2 MG tablet Take 2 mg by mouth daily before breakfast.     Yes Historical Provider, MD  IRON, FERROUS GLUCONATE, PO Take 40 mg by mouth 2 (two) times daily.   Yes Historical Provider, MD  Liraglutide (VICTOZA [place]) Inject 1.8 mg into the skin daily.    Yes Historical Provider, MD  metFORMIN (GLUCOPHAGE-XR) 500 MG 24 hr tablet Take 2 tablets (1,000 mg total) by mouth 2 (two) times daily. 05/30/15  Yes Donielle Liston Alba, PA-C  Multiple Vitamin (MULTIVITAMIN) capsule Take 1 capsule by mouth daily.     Yes Historical Provider, MD  traMADol (ULTRAM) 50 MG tablet Take 1  tablet (50 mg total) by mouth every 6 (six) hours as needed for moderate pain. 05/29/15  Yes Donielle Liston Alba, PA-C  amiodarone (PACERONE) 400 MG tablet Take 1 tablet (400 mg total) by mouth 2 (two) times daily. For 3 days;then take Amiodarone 200 mg by mouth two times daily for one week. Finally, take Amiodarone 200 mg by mouth daily thereafter Patient not taking: Reported on 05/29/2015 05/29/15   Donielle Liston Alba, PA-C  amLODipine (NORVASC) 10 MG tablet Take 1 tablet (10 mg total) by mouth daily. Patient not taking: Reported on 05/29/2015 05/29/15   Donielle Liston Alba, PA-C  aspirin EC 325 MG EC tablet Take 1 tablet (325 mg total) by mouth daily. Patient not taking: Reported on 05/29/2015 05/29/15   Donielle Liston Alba, PA-C  carvedilol (COREG) 6.25 MG tablet Take 1 tablet (6.25 mg total) by mouth 2 (two) times daily with a meal. Patient not taking: Reported on 05/29/2015 05/29/15   Nani Skillern, PA-C     Social History   Social History  . Marital Status: Married    Spouse Name: N/A  . Number of Children: N/A  . Years of Education: N/A   Occupational History  . Not on file.   Social History Main Topics  . Smoking status: Former Smoker -- 1.00 packs/day for 40 years    Types: Cigarettes    Quit date: 03/26/2009  . Smokeless  tobacco: Never Used  . Alcohol Use: No  . Drug Use: No  . Sexual Activity: Not on file   Other Topics Concern  . Not on file   Social History Narrative    Family Status  Relation Status Death Age  . Mother Deceased 75  . Father Deceased 84    Stroke  . Sister Alive    Family History  Problem Relation Age of Onset  . Cancer Mother 27    pancreatic  . Hyperlipidemia Mother   . Stroke Father 47  . Deep vein thrombosis Father   . Hyperlipidemia Father   . Hypertension Father      ROS:  Full 14 point review of systems complete and found to be negative unless listed above.  Physical Exam: Blood pressure 141/67, pulse 126, temperature 97.6 F (36.4 C), temperature source Oral, resp. rate 16, height 5\' 11"  (1.803 m), weight 219 lb 12.8 oz (99.701 kg), SpO2 99 %.  General: Well developed, well nourished, male in no acute distress Head: Eyes PERRLA, No xanthomas. Normocephalic and atraumatic, oropharynx without edema or exudate.  Lungs: Resp regular and unlabored, CTA. Diminished breath sound bibasilar. Surgical scar.  Heart: Ir Irno s3, s4, or murmurs..   Neck: No carotid bruits. No lymphadenopathy.  JVD difficult to assess due to girth.  Abdomen: Bowel sounds present, abdomen soft and non-tender without masses or hernias noted. Msk:  No spine or cva tenderness. No weakness, no joint deformities or effusions. Extremities: No clubbing, cyanosis or edema. DP/PT/Radials 2+ and equal bilaterally. Neuro: Alert and oriented X 3. No focal deficits noted. Psych:  Good affect, responds appropriately Skin: No rashes or lesions noted.  Labs:   Lab Results  Component Value Date   WBC 10.4 05/30/2015   HGB 9.7* 05/30/2015   HCT 29.8* 05/30/2015   MCV 95.8 05/30/2015   PLT 272 05/30/2015    Recent Labs  05/29/15 2155  INR 1.15    Recent Labs Lab 05/30/15 0522  NA 135  K 4.9  CL 99*  CO2 24  BUN 24*  CREATININE 1.59*  CALCIUM 8.8*  PROT 5.8*  BILITOT 0.8  ALKPHOS 56    ALT 18  AST 14*  GLUCOSE 229*  ALBUMIN 2.2*   MAGNESIUM  Date Value Ref Range Status  05/24/2015 1.9 1.7 - 2.4 mg/dL Final    Recent Labs  05/30/15 0522 05/30/15 1010  TROPONINI 0.04* 0.10*    Recent Labs  05/29/15 2301 05/30/15 0119  TROPIPOC 0.10* 0.05   PRO B NATRIURETIC PEPTIDE (BNP)  Date/Time Value Ref Range Status  04/30/2015 09:57 AM 6.0 0.0 - 100.0 pg/mL Final  Echo: 05/08/15 LV EF: 55% -  60%  ------------------------------------------------------------------- Indications:   Dyspnea (R06.00).  ------------------------------------------------------------------- History:  Risk factors: Carotid artery stenosis. PVD. RBBB. Hypertension. Diabetes mellitus. Dyslipidemia.  ------------------------------------------------------------------- Study Conclusions  - Left ventricle: Global LV longitudinal strain was -15.2% The cavity size was normal. Systolic function was normal. The estimated ejection fraction was in the range of 55% to 60%. Wall motion was normal; there were no regional wall motion abnormalities. There was an increased relative contribution of atrial contraction to ventricular filling. Doppler parameters are consistent with abnormal left ventricular relaxation (grade 1 diastolic dysfunction). - Aortic valve: Moderate thickening and calcification, consistent with sclerosis. - Aorta: Aortic root dimension: 39 mm (ED). - Aortic root: The aortic root was mildly dilated. - Atrial septum: There was increased thickness of the septum, consistent with lipomatous hypertrophy.   ECG:   Vent. rate 95 BPM PR interval 149 ms QRS duration 124 ms QT/QTc 364/458 ms  Radiology:  Dg Chest 2 View  05/29/2015  CLINICAL DATA:  Severe shortness of breath and chest pain. Recent CABG 6 days prior. EXAM: CHEST  2 VIEW COMPARISON:  05/26/2015 FINDINGS: Patient is post median sternotomy. Heart at the upper limits normal in size. Small  bilateral pleural effusions, decreased on the left and unchanged on the right. No pulmonary edema, focal consolidation or pneumothorax. The bones are under mineralized. IMPRESSION: Post CABG with small bilateral pleural effusions, decreased on the left and unchanged on the right from prior. Electronically Signed   By: Jeb Levering M.D.   On: 05/29/2015 22:29   Ct Angio Chest Pe W/cm &/or Wo Cm  05/30/2015  CLINICAL DATA:  Shortness of breath. Right and left-sided chest pain, onset 6 hr prior. Post recent CABG 6 days prior, discharged from the hospital earlier today. EXAM: CT ANGIOGRAPHY CHEST WITH CONTRAST TECHNIQUE: Multidetector CT imaging of the chest was performed using the standard protocol during bolus administration of intravenous contrast. Multiplanar CT image reconstructions and MIPs were obtained to evaluate the vascular anatomy. CONTRAST:  176mL OMNIPAQUE IOHEXOL 350 MG/ML SOLN COMPARISON:  Chest radiograph 2 hr prior. FINDINGS: There are no filling defects within the pulmonary arteries to suggest pulmonary embolus. Patient is post CABG with expected postsurgical change in the mediastinum with soft tissue stranding. Expected foci of air in the mediastinal fat inferiorly. No mediastinal hematoma or abscess. There is atherosclerosis of the thoracic aorta without aneurysm. Small pericardial effusion measuring up to 1.5 cm in depth. No abnormal pericardial enhancement. There are small bilateral pleural effusions. No mediastinal or hilar adenopathy. Mild emphysematous change. Expected compressive atelectasis in the lower lobes adjacent to pleural effusions. No pneumonia or pulmonary edema. No pneumothorax. The esophagus is decompressed. Cholelithiasis noted in the included upper abdomen. Sternal wires appear intact, mild widening of the sternotomy inferiorly. There are no acute or suspicious osseous abnormalities. Review of the MIP images confirms the above findings. IMPRESSION: 1. No pulmonary  embolus.  2. Small bilateral pleural effusions with adjacent compressive atelectasis. Small pericardial effusion. 3. Expected postsurgical change in the mediastinum post recent CABG. Electronically Signed   By: Jeb Levering M.D.   On: 05/30/2015 00:28    ASSESSMENT AND PLAN:     1. Afib with RVR - CHADSVASC score of at least 4 (AGE, HTN, CAD and DM). He went into a fib with RVR during recent admission and was put on an Amiodarone drip. He converted to sinus rhythm. He was discharged 05/29/15 home on taper dose of amiodarone po, aspirin 325mg  and coreg 6.25mg .  - He again went to Afib with RVR today and placed on cardizem drip (currently at rate of 12.43ml/hr). He did not received bolus fo cardizem. Also on coreg 3.125mg  BID, ASA 81mg  and Lovenox currently.  - EF of 55-60% on echo 04/2015.  2. Chest pain/ CAD s/p CABG x 3 05/23/15 - Presented with chest discomfort. Trop trend 0.04->0.1 - Currently chest pain free. - CTA without PE or aortic discussion - CTCS is following  3. Elevated creatinine - Cr of  1.53-->1.59.  (has been around 1.5-1.7 during last admission 10/12-10/18) - Could be from contrast  4. Essential hypertension - BP is minimally elevated at times - Continue Coreg 3.125mg  and IV cardizem  5. Hyperlipidemia - Continue lipitor 40mg . - No Lipid panel in last year. Consider  lipid panel.   6. Type 2 diabetes mellitus with vascular disease ([place]) - Per primary  7. OSA on CPAP  8. Small bilateral pleural effusions - No sign of CHF. Lungs to CTA on exam.    9. Acute blood loss anemia  - Hemoglobin of  9.7. On [place]. - Monitor closely.     SignedLeanor Kail, [place] 05/30/2015, 12:16 PM Pager [phone]  Co-Sign MD  Patient seen and examined. Agree with assessment and plan. Very pleasant 69 yo WM who was dc'd yesterday after undergoing cath and CABG. He had developed post AF and was treated with iv and subsequently oral amiodarone.  After going home last  evening he developed right sided chest wall pain with dyspnea. A CT was negative for PE or dissection and showed small bilateral pleural effusions anda small pericardial effusion, and emphysematous changes. This am he developed recurrent AF with RVR and wheezing. His amiodarone has been dc'd. He was started on a cardizem drip without a bolus. Will give a bolus of 10 mg since a bolus was not given. With mild residual anterior end expiratory wheezing will change from coreg to low dose metoprolol since coreg is nonselective and metoprolol at low dose is a cardioselective BB and should not exacerbate wheezing. If ok with surgery consider full dose anticoagulation rather than DVT prophylactic dose.    Troy Sine, MD, [facility] 05/30/2015 1:20 PM

## 2015-05-30 NOTE — Progress Notes (Signed)
      [address]       [place] [address]             [phone]             Subjective: Patient with popping/clicking left side of sternum  Objective: Vital signs in last 24 hours: Temp:  [97.6 F (36.4 C)-98 F (36.7 C)] 97.6 F (36.4 C) (10/19 0300) Pulse Rate:  [84-122] 122 (10/19 0300) Cardiac Rhythm:  [-] Normal sinus rhythm;Bundle branch block (10/19 0709) Resp:  [12-23] 16 (10/19 0300) BP: (108-174)/(55-94) 157/55 mmHg (10/19 0300) SpO2:  [96 %-100 %] 99 % (10/19 0300) Weight:  [218 lb (98.884 kg)-219 lb 12.8 oz (99.701 kg)] 219 lb 12.8 oz (99.701 kg) (10/19 0300)  Pre op weight 95 kg Current Weight  05/30/15 219 lb 12.8 oz (99.701 kg)        Physical Exam:  Cardiovascular: IRRR IRRR Pulmonary: Diminished at bases; no rales, wheezes, or rhonchi. Abdomen: Soft, non tender, bowel sounds present. Extremities: Mild bilateral lower extremity edema. Wounds: Clean and dry.  No erythema or signs of infection. There is a popping mid left of sternum. No sternal instability  Lab Results: CBC: Recent Labs  05/29/15 2155 05/30/15 0522  WBC 16.3* 10.4  HGB 10.3* 9.7*  HCT 30.4* 29.8*  PLT 294 272   BMET:  Recent Labs  05/29/15 2155 05/30/15 0522  NA 130* 135  K 4.7 4.9  CL 97* 99*  CO2 21* 24  GLUCOSE 153* 229*  BUN 23* 24*  CREATININE 1.53* 1.59*  CALCIUM 8.4* 8.8*    PT/INR:  Lab Results  Component Value Date   INR 1.15 05/29/2015   INR 1.51* 05/23/2015   INR 1.01 05/21/2015   ABG:  INR: Will add last result for INR, ABG once components are confirmed Will add last 4 CBG results once components are confirmed  Assessment/Plan:  1. CV - A fib with HR in the 120's. Was on Amiodarone previously, but has intermittent wheezing.Will start Cardizem drip, continue low dose Coreg, and consult cardiology.  2.  Pulmonary - On 2 liters of oxygen via [place]. CXR done yesterday on admission showed small bilateral pleural effusions. CT  chest done showed NO PE, small bilateral pleural effusions with compressive atelectasis, small pericardial effusion, and no pneumothorax. Xopenex PRN wheezing. Encourage incentive spirometer and flutter valve. 3.  Acute blood loss anemia - H and H 9.7 and 29.8 this am. On Fergon. 4. Creatinine remains 1.59. His baseline is around 1.5. 5. Regarding popping of left of sternum, likely cartilage disruption. No sternal instability at this time. PRN pain meds and observation.  Hjalmer Iovino MPA-C 05/30/2015,8:19 AM

## 2015-05-30 NOTE — Progress Notes (Signed)
Patient HR 130's Afib. Patient diaphoretic, pale, and stating SOB. Wheezing upon assessment. CT surgeon to bedside. Will continue to monitor.   Domingo Dimes RN

## 2015-05-30 NOTE — Progress Notes (Signed)
PROGRESS NOTE    Bradley [name] [id number] DOB: [birth year] DOA: 05/29/2015 PCP:  Melinda Crutch, MD  HPI/Brief narrative 69 year old male with history of CAD, status post CABG 3 on 05/23/15, HLD, HTN, DM 2, PAD, CVA, OSA on nightly CPAP, GERD, carotid artery occlusion, anemia, discharged home by thoracic surgeons on 05/29/15, return to ED same night with complaints of transient left-sided chest pain and felt like something popped out of his chest, right-sided chest pain, diaphoresis and dyspnea. In the ED, symptoms improved after sublingual NTG and nebulizations. Thoracic surgery was consulted by EDP. CTA chest negative for PE. Admitted for further evaluation and management. Went into A. fib with RVR in the 120s. Cardiology consulted.   Assessment/Plan:  Atypical chest pain - Status post CABG on 05/23/15. - Mildly elevated troponin, 0.04 > 0.10-probably not significant. CTA chest: No pulmonary embolism - Chest pain resolved. - Management per cardiology/cardiothoracic surgery - May be related to recent surgical trauma/cartilage disruption.  Paroxysmal A. fib with RVR - Asymptomatic currently - Thoracic surgery has started patient on Cardizem drip, continue low-dose Coreg and have consulted cardiology for further input - anticoagulation decision defer to cardiology  Hypokalemia - Replaced  Acute blood loss anemia - Stable. Follow CBCs.  Stage III chronic kidney disease - Baseline creatinine probably in the 1.4 range. Current creatinine 1.5-almost at baseline. Follow BMP periodically.  Uncontrolled type II DM with renal complications - DC metformin. Hold Amaryl. Continue NovoLog SSI. Will start low-dose Lantus.  OSA - Continue nightly CPAP.  Essential hypertension - Controlled  HLD - Statins  CAD status post CABG - Management per cardiology/cardiothoracic surgery   DVT prophylaxis: Subcutaneous Lovenox Code Status: Full Family Communication: Discussed with spouse  at bedside Disposition Plan: DC home when medically stable   Consultants:  Cardiothoracic surgery  Cardiology  Procedures:  None  Antibiotics:  None   Subjective: Feels better. No CP/SOB. Cough- non productive. Mild intermittent wheezin  Objective: Filed Vitals:   05/30/15 1032 05/30/15 1045 05/30/15 1135 05/30/15 1225  BP:  140/60 141/67 135/67  Pulse:  117 126 120  Temp:      TempSrc:      Resp:      Height:      Weight:      SpO2: 99%      temperature 97.59F and respiratory rate 16/m  Intake/Output Summary (Last 24 hours) at 05/30/15 1254 Last data filed at 05/30/15 1045  Gross per 24 hour  Intake      0 ml  Output   1150 ml  Net  -1150 ml   Filed Weights   05/29/15 2122 05/30/15 0300  Weight: 98.884 kg (218 lb) 99.701 kg (219 lb 12.8 oz)     Exam:  General exam: Pleasant elderly male sitting up comfortably at edge of bed. Respiratory system: Reduced breath sounds in the bases but otherwise clear to auscultation. No increased work of breathing. Cardiovascular system: S1 & S2 heard, irregularly irregular and tachycardic. No JVD, murmurs, gallops, clicks or pedal edema. Telemetry: PAF with RVR in the 120s Gastrointestinal system: Abdomen is nondistended, soft and nontender. Normal bowel sounds heard. Central nervous system: Alert and oriented. No focal neurological deficits. Extremities: Symmetric 5 x 5 power.   Data Reviewed: Basic Metabolic Panel:  Recent Labs Lab 05/23/15 2020  05/24/15 0325 05/24/15 1640  05/27/15 0410 05/28/15 0237 05/29/15 0226 05/29/15 2155 05/30/15 0522  NA  --   < > 136  --   < > 135 136  135 130* 135  K  --   < > 4.1  --   < > 3.2* 3.6 3.4* 4.7 4.9  CL  --   < > 105  --   < > 97* 97* 99* 97* 99*  CO2  --   --  22  --   < > 25 28 25  21* 24  GLUCOSE  --   < > 116*  --   < > 128* 80 114* 153* 229*  BUN  --   < > 13  --   < > 19 19 20  23* 24*  CREATININE 1.48*  < > 1.50* 1.78*  < > 1.50* 1.58* 1.64* 1.53* 1.59*    CALCIUM  --   --  8.2*  --   < > 9.0 8.8* 8.7* 8.4* 8.8*  MG 2.4  --  2.1 1.9  --   --   --   --   --   --   < > = values in this interval not displayed. Liver Function Tests:  Recent Labs Lab 05/30/15 0522  AST 14*  ALT 18  ALKPHOS 56  BILITOT 0.8  PROT 5.8*  ALBUMIN 2.2*   No results for input(s): LIPASE, AMYLASE in the last 168 hours. No results for input(s): AMMONIA in the last 168 hours. CBC:  Recent Labs Lab 05/24/15 1640 05/24/15 1649 05/25/15 0500 05/26/15 0415 05/29/15 2155 05/30/15 0522  WBC 19.3*  --  16.3* 15.1* 16.3* 10.4  NEUTROABS  --   --   --   --   --  9.9*  HGB 11.1* 12.2* 10.4* 10.7* 10.3* 9.7*  HCT 33.6* 36.0* 31.1* 31.4* 30.4* 29.8*  MCV 97.1  --  97.8 95.4 95.3 95.8  PLT 190  --  168 191 294 272   Cardiac Enzymes:  Recent Labs Lab 05/30/15 0522 05/30/15 1010  TROPONINI 0.04* 0.10*   BNP (last 3 results)  Recent Labs  04/30/15 0957  PROBNP 6.0   CBG:  Recent Labs Lab 05/28/15 2156 05/29/15 0618 05/29/15 1152 05/30/15 0625 05/30/15 1137  GLUCAP 153* 119* 184* 212* 314*    Recent Results (from the past 240 hour(s))  Surgical pcr screen     Status: None   Collection Time: 05/21/15  1:31 PM  Result Value Ref Range Status   MRSA, PCR NEGATIVE NEGATIVE Final   Staphylococcus aureus NEGATIVE NEGATIVE Final    Comment:        The Xpert SA Assay (FDA approved for NASAL specimens in patients over 67 years of age), is one component of a comprehensive surveillance program.  Test performance has been validated by [facility] for patients greater than or equal to 62 year old. It is not intended to diagnose infection nor to guide or monitor treatment.            Studies: Dg Chest 2 View  05/29/2015  CLINICAL DATA:  Severe shortness of breath and chest pain. Recent CABG 6 days prior. EXAM: CHEST  2 VIEW COMPARISON:  05/26/2015 FINDINGS: Patient is post median sternotomy. Heart at the upper limits normal in size. Small  bilateral pleural effusions, decreased on the left and unchanged on the right. No pulmonary edema, focal consolidation or pneumothorax. The bones are under mineralized. IMPRESSION: Post CABG with small bilateral pleural effusions, decreased on the left and unchanged on the right from prior. Electronically Signed   By: Jeb Levering M.D.   On: 05/29/2015 22:29   Ct Angio Chest Pe W/cm &/  or Wo Cm  05/30/2015  CLINICAL DATA:  Shortness of breath. Right and left-sided chest pain, onset 6 hr prior. Post recent CABG 6 days prior, discharged from the hospital earlier today. EXAM: CT ANGIOGRAPHY CHEST WITH CONTRAST TECHNIQUE: Multidetector CT imaging of the chest was performed using the standard protocol during bolus administration of intravenous contrast. Multiplanar CT image reconstructions and MIPs were obtained to evaluate the vascular anatomy. CONTRAST:  154mL OMNIPAQUE IOHEXOL 350 MG/ML SOLN COMPARISON:  Chest radiograph 2 hr prior. FINDINGS: There are no filling defects within the pulmonary arteries to suggest pulmonary embolus. Patient is post CABG with expected postsurgical change in the mediastinum with soft tissue stranding. Expected foci of air in the mediastinal fat inferiorly. No mediastinal hematoma or abscess. There is atherosclerosis of the thoracic aorta without aneurysm. Small pericardial effusion measuring up to 1.5 cm in depth. No abnormal pericardial enhancement. There are small bilateral pleural effusions. No mediastinal or hilar adenopathy. Mild emphysematous change. Expected compressive atelectasis in the lower lobes adjacent to pleural effusions. No pneumonia or pulmonary edema. No pneumothorax. The esophagus is decompressed. Cholelithiasis noted in the included upper abdomen. Sternal wires appear intact, mild widening of the sternotomy inferiorly. There are no acute or suspicious osseous abnormalities. Review of the MIP images confirms the above findings. IMPRESSION: 1. No pulmonary  embolus. 2. Small bilateral pleural effusions with adjacent compressive atelectasis. Small pericardial effusion. 3. Expected postsurgical change in the mediastinum post recent CABG. Electronically Signed   By: Jeb Levering M.D.   On: 05/30/2015 00:28        Scheduled Meds: . aspirin EC  81 mg Oral Daily  . atorvastatin  40 mg Oral q1800  . calcium-vitamin D  1 tablet Oral Daily  . carvedilol  3.125 mg Oral BID WC  . cholecalciferol  2,000 Units Oral Daily  . enoxaparin (LOVENOX) injection  40 mg Subcutaneous Q24H  . ferrous gluconate  324 mg Oral BID WC  . glimepiride  2 mg Oral QAC breakfast  . insulin aspart  0-9 Units Subcutaneous TID WC  . multivitamin with minerals  1 tablet Oral Daily  . pantoprazole  40 mg Oral Daily  . sodium chloride  3 mL Intravenous Q12H  . vitamin C  250 mg Oral Daily   Continuous Infusions: . diltiazem (CARDIZEM) infusion 12.5 mg/hr (05/30/15 1226)    Principal Problem:   Chest pain Active Problems:   Essential hypertension   Hyperlipidemia   Type 2 diabetes mellitus with vascular disease ([name])   OSA on CPAP   Pain in the chest    Time spent: 40 minutes.    Vernell Leep, MD, FACP, FHM. [facility] Pager [phone]  If 7PM-7AM, please contact night-coverage [email or web address] Password [email or web address] 05/30/2015, 12:54 PM

## 2015-05-30 NOTE — H&P (Signed)
[facility] History and Physical  Bradley [name] [id number] DOB: 1945-08-26 DOA: 05/29/2015  Referring physician: Dr. Martyn Ehrich. PCP:  Melinda Crutch, MD  Specialists: Dr. Servando Snare. Cardiothoracic surgeon.  Chief Complaint: Chest pain.  HPI: Bradley [name] is a 69 y.o. male with history of CAD status post CABG last week and was discharged yesterday went home and was having dinner when patient started having some chest discomfort like something popped out of his bedside of his anterior chest wall and also some abnormal sensation on the right chest which is becoming more intense and pressure-like with diaphoresis. EMS was called and patient was brought to the ER. Patient was given sublingual nitroglycerin and nebulizer admission also was wheezing. Following which patient's symptoms improved. Troponin was mildly elevated. On-call cardiothoracic surgeon Dr. Ricard Dillon was consulted by the ER physician. Dr. Ricard Dillon requested a CT angiogram of the chest and admitted for observation. She did develop the chest was negative for any acute except for pleural effusion and mild pericardial effusion and postoperative changes. On exam patient presently is resting and chest pain and shortness of breath is resolved at this time. Denies any nausea vomiting abdominal pain or diarrhea.  Review of Systems: As presented in the history of presenting illness, rest negative.  Past Medical History  Diagnosis Date  . Hyperlipidemia   . Hypertension   . Diabetes mellitus age 51  . Peripheral vascular disease ([place])   . Arthritis   . Reflux esophagitis   . Stroke [facility]) 1987    Right brain stroke  . Neuropathy ([place]) 2013  . Osteoporosis 2013  . Sleep apnea     uses CPAP  . GERD (gastroesophageal reflux disease)   . H/O hiatal hernia   . Irritable bowel syndrome (IBS)   . Myocardial infarction [facility])     silent inferior MI; patient denies MI history (03/17/13)   . Anemia   . Carotid artery occlusion   . Coronary  artery disease     cath 05/17/2015 90% mid LAD, 80% ost D2, 75% mid RCA, 35% prox RCA. CT surgery consulted  . Shortness of breath dyspnea   . Diverticulitis   . Wears glasses    Past Surgical History  Procedure Laterality Date  . Carotid endarterectomy  1994 & redo 2001    Left  . Pr vein bypass graft,aorto-fem-pop  1998  . Iliac artery stent    . Fracture surgery  2013    Right   foo  t X's 2  . Tonsillectomy    . Posterior lumbar fusion  Aug. 14, 2014    Level 1  . Spine surgery  2014  . Cardiac catheterization N/A 05/17/2015    Procedure: Left Heart Cath and Coronary Angiography;  Surgeon: Belva Crome, MD;  Location: [place] [facility];  Service: Cardiovascular;  Laterality: N/A;  . Colonoscopy w/ biopsies and polypectomy    . Coronary artery bypass graft N/A 05/23/2015    Procedure: CORONARY ARTERY BYPASS GRAFTING (CABG) x 3 (LIMA to LAD, SVG to DIAGONAL 2, SVG to PDA) with Endoscopic Vein Harvesting from right greater saphenous vein;  Surgeon: Grace Isaac, MD;  Location: [place];  Service: Open Heart Surgery;  Laterality: N/A;  . Tee without cardioversion N/A 05/23/2015    Procedure: TRANSESOPHAGEAL ECHOCARDIOGRAM (TEE);  Surgeon: Grace Isaac, MD;  Location: [place];  Service: Open Heart Surgery;  Laterality: N/A;   Social History:  reports that he quit smoking about 6 years ago. His smoking  use included Cigarettes. He has a 40 pack-year smoking history. He has never used smokeless tobacco. He reports that he does not drink alcohol or use illicit drugs. Where does patient live home. Can patient participate in ADLs? Yes.  Allergies  Allergen Reactions  . Dilaudid Cough Nausea Only and Cough  . Hydrocodone Nausea Only  . Oxycodone Nausea Only  . Penicillins Rash    Has patient had a PCN reaction causing immediate rash, facial/tongue/throat swelling, SOB or lightheadedness with hypotension: NO Has patient had a PCN reaction causing severe rash involving mucus membranes  or skin necrosis:NO Has patient had a PCN reaction that required hospitalization NO Has patient had a PCN reaction occurring within the last 10 years: NO If all of the above answers are "NO", then may proceed with Cephalosporin use.   [facility] Cross Reactors Rash    Family History:  Family History  Problem Relation Age of Onset  . Cancer Mother 5    pancreatic  . Hyperlipidemia Mother   . Stroke Father 35  . Deep vein thrombosis Father   . Hyperlipidemia Father   . Hypertension Father       Prior to Admission medications   Medication Sig Start Date End Date Taking? Authorizing Provider  Ascorbic Acid (VITAMIN C) 100 MG tablet Take 100 mg by mouth daily.   Yes Historical Provider, MD  atorvastatin (LIPITOR) 40 MG tablet Take 40 mg by mouth daily.     Yes Historical Provider, MD  Calcium Carbonate-Vitamin D (CALCIUM-D) 600-400 MG-UNIT TABS Take 1 tablet by mouth daily.    Yes Historical Provider, MD  Cholecalciferol (D-3-5) 5000 UNITS capsule Take 5,000 Units by mouth daily.   Yes Historical Provider, MD  dexlansoprazole (DEXILANT) 60 MG capsule Take 60 mg by mouth daily.   Yes Historical Provider, MD  glimepiride (AMARYL) 2 MG tablet Take 2 mg by mouth daily before breakfast.     Yes Historical Provider, MD  IRON, FERROUS GLUCONATE, PO Take 40 mg by mouth 2 (two) times daily.   Yes Historical Provider, MD  Liraglutide (VICTOZA [place]) Inject 1.8 mg into the skin daily.    Yes Historical Provider, MD  metFORMIN (GLUCOPHAGE-XR) 500 MG 24 hr tablet Take 2 tablets (1,000 mg total) by mouth 2 (two) times daily. 05/30/15  Yes Donielle Liston Alba, PA-C  Multiple Vitamin (MULTIVITAMIN) capsule Take 1 capsule by mouth daily.     Yes Historical Provider, MD  traMADol (ULTRAM) 50 MG tablet Take 1 tablet (50 mg total) by mouth every 6 (six) hours as needed for moderate pain. 05/29/15  Yes Donielle Liston Alba, PA-C  amiodarone (PACERONE) 400 MG tablet Take 1 tablet (400 mg total) by mouth 2  (two) times daily. For 3 days;then take Amiodarone 200 mg by mouth two times daily for one week. Finally, take Amiodarone 200 mg by mouth daily thereafter Patient not taking: Reported on 05/29/2015 05/29/15   Donielle Liston Alba, PA-C  amLODipine (NORVASC) 10 MG tablet Take 1 tablet (10 mg total) by mouth daily. Patient not taking: Reported on 05/29/2015 05/29/15   Donielle Liston Alba, PA-C  aspirin EC 325 MG EC tablet Take 1 tablet (325 mg total) by mouth daily. Patient not taking: Reported on 05/29/2015 05/29/15   Donielle Liston Alba, PA-C  carvedilol (COREG) 6.25 MG tablet Take 1 tablet (6.25 mg total) by mouth 2 (two) times daily with a meal. Patient not taking: Reported on 05/29/2015 05/29/15   Nani Skillern, PA-C  Physical Exam: Filed Vitals:   05/29/15 2340 05/30/15 0030 05/30/15 0100 05/30/15 0200  BP: 142/72 148/70 120/94 138/78  Pulse: 92 90 90 119  Temp: 98 F (36.7 C)     TempSrc: Oral     Resp: 17 13 12 13   Height:      Weight:      SpO2: 100% 97% 98% 97%     General:  Moderately built and nourished.  Eyes: Anicteric no pallor.  ENT: No discharge from the ears eyes nose or mouth.  Neck: No mass felt.  Cardiovascular: S1-S2 heard.  Respiratory: No rhonchi or crepitations.  Abdomen: Soft nontender bowel sounds present.  Skin: Suture site looks healing.  Musculoskeletal: No edema.  Psychiatric: Appears normal.  Neurologic: Alert awake oriented to time place and person. Moves all extremities.  Labs on Admission:  Basic Metabolic Panel:  Recent Labs Lab 05/23/15 2020  05/24/15 0325 05/24/15 1640  05/26/15 0415 05/27/15 0410 05/28/15 0237 05/29/15 0226 05/29/15 2155  NA  --   < > 136  --   < > 131* 135 136 135 130*  K  --   < > 4.1  --   < > 3.6 3.2* 3.6 3.4* 4.7  CL  --   < > 105  --   < > 97* 97* 97* 99* 97*  CO2  --   --  22  --   < > 23 25 28 25  21*  GLUCOSE  --   < > 116*  --   < > 135* 128* 80 114* 153*  BUN  --   < > 13  --    < > 17 19 19 20  23*  CREATININE 1.48*  < > 1.50* 1.78*  < > 1.68* 1.50* 1.58* 1.64* 1.53*  CALCIUM  --   --  8.2*  --   < > 8.4* 9.0 8.8* 8.7* 8.4*  MG 2.4  --  2.1 1.9  --   --   --   --   --   --   < > = values in this interval not displayed. Liver Function Tests: No results for input(s): AST, ALT, ALKPHOS, BILITOT, PROT, ALBUMIN in the last 168 hours. No results for input(s): LIPASE, AMYLASE in the last 168 hours. No results for input(s): AMMONIA in the last 168 hours. CBC:  Recent Labs Lab 05/24/15 0325 05/24/15 1640 05/24/15 1649 05/25/15 0500 05/26/15 0415 05/29/15 2155  WBC 11.6* 19.3*  --  16.3* 15.1* 16.3*  HGB 10.1* 11.1* 12.2* 10.4* 10.7* 10.3*  HCT 30.2* 33.6* 36.0* 31.1* 31.4* 30.4*  MCV 96.5 97.1  --  97.8 95.4 95.3  PLT 171 190  --  168 191 294   Cardiac Enzymes: No results for input(s): CKTOTAL, CKMB, CKMBINDEX, TROPONINI in the last 168 hours.  BNP (last 3 results) No results for input(s): BNP in the last 8760 hours.  ProBNP (last 3 results)  Recent Labs  04/30/15 0957  PROBNP 6.0    CBG:  Recent Labs Lab 05/28/15 1109 05/28/15 1609 05/28/15 2156 05/29/15 0618 05/29/15 1152  GLUCAP 221* 99 153* 119* 184*    Radiological Exams on Admission: Dg Chest 2 View  05/29/2015  CLINICAL DATA:  Severe shortness of breath and chest pain. Recent CABG 6 days prior. EXAM: CHEST  2 VIEW COMPARISON:  05/26/2015 FINDINGS: Patient is post median sternotomy. Heart at the upper limits normal in size. Small bilateral pleural effusions, decreased on the left and unchanged on the right.  No pulmonary edema, focal consolidation or pneumothorax. The bones are under mineralized. IMPRESSION: Post CABG with small bilateral pleural effusions, decreased on the left and unchanged on the right from prior. Electronically Signed   By: Jeb Levering M.D.   On: 05/29/2015 22:29   Ct Angio Chest Pe W/cm &/or Wo Cm  05/30/2015  CLINICAL DATA:  Shortness of breath. Right and  left-sided chest pain, onset 6 hr prior. Post recent CABG 6 days prior, discharged from the hospital earlier today. EXAM: CT ANGIOGRAPHY CHEST WITH CONTRAST TECHNIQUE: Multidetector CT imaging of the chest was performed using the standard protocol during bolus administration of intravenous contrast. Multiplanar CT image reconstructions and MIPs were obtained to evaluate the vascular anatomy. CONTRAST:  189mL OMNIPAQUE IOHEXOL 350 MG/ML SOLN COMPARISON:  Chest radiograph 2 hr prior. FINDINGS: There are no filling defects within the pulmonary arteries to suggest pulmonary embolus. Patient is post CABG with expected postsurgical change in the mediastinum with soft tissue stranding. Expected foci of air in the mediastinal fat inferiorly. No mediastinal hematoma or abscess. There is atherosclerosis of the thoracic aorta without aneurysm. Small pericardial effusion measuring up to 1.5 cm in depth. No abnormal pericardial enhancement. There are small bilateral pleural effusions. No mediastinal or hilar adenopathy. Mild emphysematous change. Expected compressive atelectasis in the lower lobes adjacent to pleural effusions. No pneumonia or pulmonary edema. No pneumothorax. The esophagus is decompressed. Cholelithiasis noted in the included upper abdomen. Sternal wires appear intact, mild widening of the sternotomy inferiorly. There are no acute or suspicious osseous abnormalities. Review of the MIP images confirms the above findings. IMPRESSION: 1. No pulmonary embolus. 2. Small bilateral pleural effusions with adjacent compressive atelectasis. Small pericardial effusion. 3. Expected postsurgical change in the mediastinum post recent CABG. Electronically Signed   By: Jeb Levering M.D.   On: 05/30/2015 00:28    EKG: Independently reviewed. Sinus rhythm with RBBB.  Assessment/Plan Principal Problem:   Chest pain Active Problems:   Essential hypertension   Hyperlipidemia   Type 2 diabetes mellitus with vascular  disease ([name])   OSA on CPAP   Pain in the chest   1. Chest pain - appears atypical. Presently chest pain resolved. We will cycle cardiac markers. Aspirin. Statins and Coreg. When necessary nitroglycerin. Cardiothoracic surgery to see in for further recommendations. Patient may need Lasix. 2. Diabetes mellitus type 2 - while inpatient patient will be on Amaryl and sliding scale coverage. Hold metformin due to renal failure. 3. Chronic kidney disease stage III - creatinine appears to be at baseline. Follow metabolic panel. 4. Mild hyponatremia - probably from mild fluid overload may need Lasix if patient is symptomatic. Closely follow metabolic panel. 5. Blood loss anemia - hemoglobin appears to be stable. Follow CBC. 6. OSA on CPAP.  I have personally reviewed patient's chest x-ray and EKG. I reviewed patient's old chart and labs.   DVT Prophylaxis Lovenox.  Code Status: Full code.  Family Communication: Patient's wife.  Disposition Plan: Admit for observation.    Tyanna Hach N. [facility] Pager [phone].  If 7PM-7AM, please contact night-coverage [email or web address] Password [email or web address] 05/30/2015, 4:10 AM       92

## 2015-05-31 ENCOUNTER — Inpatient Hospital Stay (HOSPITAL_COMMUNITY): Payer: PPO

## 2015-05-31 DIAGNOSIS — N179 Acute kidney failure, unspecified: Secondary | ICD-10-CM

## 2015-05-31 DIAGNOSIS — D62 Acute posthemorrhagic anemia: Secondary | ICD-10-CM

## 2015-05-31 DIAGNOSIS — I480 Paroxysmal atrial fibrillation: Secondary | ICD-10-CM

## 2015-05-31 DIAGNOSIS — N183 Chronic kidney disease, stage 3 (moderate): Secondary | ICD-10-CM

## 2015-05-31 DIAGNOSIS — R0789 Other chest pain: Secondary | ICD-10-CM

## 2015-05-31 LAB — GLUCOSE, CAPILLARY
Glucose-Capillary: 190 mg/dL — ABNORMAL HIGH (ref 65–99)
Glucose-Capillary: 211 mg/dL — ABNORMAL HIGH (ref 65–99)
Glucose-Capillary: 172 mg/dL — ABNORMAL HIGH (ref 65–99)
Glucose-Capillary: 255 mg/dL — ABNORMAL HIGH (ref 65–99)

## 2015-05-31 LAB — CBC
HCT: 28.7 % — ABNORMAL LOW (ref 39.0–52.0)
Hemoglobin: 9.4 g/dL — ABNORMAL LOW (ref 13.0–17.0)
MCH: 31.6 pg (ref 26.0–34.0)
MCHC: 32.8 g/dL (ref 30.0–36.0)
MCV: 96.6 fL (ref 78.0–100.0)
Platelets: 370 10*3/uL (ref 150–400)
RBC: 2.97 MIL/uL — ABNORMAL LOW (ref 4.22–5.81)
RDW: 13.6 % (ref 11.5–15.5)
WBC: 15 10*3/uL — ABNORMAL HIGH (ref 4.0–10.5)

## 2015-05-31 LAB — BASIC METABOLIC PANEL
Anion gap: 10 (ref 5–15)
BUN: 30 mg/dL — ABNORMAL HIGH (ref 6–20)
CO2: 27 mmol/L (ref 22–32)
Calcium: 9.2 mg/dL (ref 8.9–10.3)
Chloride: 101 mmol/L (ref 101–111)
Creatinine, Ser: 1.71 mg/dL — ABNORMAL HIGH (ref 0.61–1.24)
GFR calc Af Amer: 45 mL/min — ABNORMAL LOW (ref >60)
GFR calc non Af Amer: 38 mL/min — ABNORMAL LOW (ref >60)
Glucose, Bld: 173 mg/dL — ABNORMAL HIGH (ref 65–99)
Potassium: 4 mmol/L (ref 3.5–5.1)
Sodium: 138 mmol/L (ref 135–145)

## 2015-05-31 LAB — LIPID PANEL
Cholesterol: 83 mg/dL (ref 0–200)
HDL: 26 mg/dL — ABNORMAL LOW (ref >40)
LDL Cholesterol: 36 mg/dL (ref 0–99)
Total CHOL/HDL Ratio: 3.2 RATIO
Triglycerides: 104 mg/dL (ref <150)
VLDL: 21 mg/dL (ref 0–40)

## 2015-05-31 MED ORDER — DILTIAZEM HCL 60 MG PO TABS
75.0000 mg | ORAL_TABLET | Freq: Four times a day (QID) | ORAL | Status: DC
  Administered 2015-05-31 – 2015-06-04 (×15): 75 mg via ORAL
  Filled 2015-05-31 (×30): qty 1

## 2015-05-31 NOTE — Progress Notes (Addendum)
      [address]       [place] [address]             [phone]             Subjective: Patient's only complaint is popping/clicking left side of sternum  Objective: Vital signs in last 24 hours: Temp:  [97.5 F (36.4 C)-97.8 F (36.6 C)] 97.5 F (36.4 C) (10/20 0515) Pulse Rate:  [75-126] 75 (10/20 0515) Cardiac Rhythm:  [-] Normal sinus rhythm (10/19 1900) Resp:  [16] 16 (10/20 0515) BP: (122-141)/(58-67) 125/63 mmHg (10/20 0515) SpO2:  [96 %-100 %] 97 % (10/20 0515) Weight:  [212 lb 11.9 oz (96.5 kg)] 212 lb 11.9 oz (96.5 kg) (10/20 0515)  Pre op weight 95 kg Current Weight  05/31/15 212 lb 11.9 oz (96.5 kg)     10/19 0701 - 10/20 0700 In: 240 [P.O.:240] Out: 2800 [Urine:2800]  Physical Exam:  Cardiovascular: RRR Pulmonary: Mostly clear; no rales, wheezes, or rhonchi. Abdomen: Soft, non tender, bowel sounds present. Extremities: Tracebilateral lower extremity edema. Wounds: Clean and dry.  No erythema or signs of infection. There is a popping mid left of sternum. No sternal instability  Lab Results: CBC:  Recent Labs  05/30/15 0522 05/31/15 0318  WBC 10.4 15.0*  HGB 9.7* 9.4*  HCT 29.8* 28.7*  PLT 272 370   BMET:   Recent Labs  05/30/15 0522 05/31/15 0318  NA 135 138  K 4.9 4.0  CL 99* 101  CO2 24 27  GLUCOSE 229* 173*  BUN 24* 30*  CREATININE 1.59* 1.71*  CALCIUM 8.8* 9.2    PT/INR:  Lab Results  Component Value Date   INR 1.15 05/29/2015   INR 1.51* 05/23/2015   INR 1.01 05/21/2015   ABG:  INR: Will add last result for INR, ABG once components are confirmed Will add last 4 CBG results once components are confirmed  Assessment/Plan:  1. CV - Previous A fib with RVR in the 120's. Converted to SR and is in the 80's this am.  On Cardizem drip, Lopressor 25 mg bid. Will transition to oral Cardizem-per cardiology.  2.  Pulmonary - On room air this am. CXR is stable. Xopenex PRN wheezing. Encourage incentive  spirometer and flutter valve. 3.  Acute blood loss anemia - H and H 9.4 and 28.7 this am. On Fergon. 4. Creatinine this am 1.7. His baseline is around 1.5. Had good diuresis with Lasix 40 mg IV. Will discuss if requires further diuresis with Dr. Servando Snare. 5. Regarding popping of left of sternum, likely cartilage disruption. No sternal instability at this time. PRN pain meds and observation.  ZIMMERMAN,DONIELLE MPA-C 05/31/2015,7:24 AM  On chest xray and ct lowest sternal wire displaced, remainder are ok, usual sternal precautions, does not need rewireing  Now in sinus I have seen and examined Bradley [name] and agree with the above assessment  and plan.  Grace Isaac MD Beeper [phone] Office [phone] 05/31/2015 10:18 AM

## 2015-05-31 NOTE — Progress Notes (Addendum)
PROGRESS NOTE    Bradley [name] [id number] DOB: September 12, 1945 DOA: 05/29/2015 PCP:  Melinda Crutch, MD  HPI/Brief narrative 69 year old male with history of CAD, status post CABG 3 on 05/23/15, HLD, HTN, DM 2, PAD, CVA, OSA on nightly CPAP, GERD, carotid artery occlusion, anemia, discharged home by thoracic surgeons on 05/29/15, return to ED same night with complaints of transient left-sided chest pain and felt like something popped out of his chest, right-sided chest pain, diaphoresis and dyspnea. In the ED, symptoms improved after sublingual NTG and nebulizations. Thoracic surgery was consulted by EDP. CTA chest negative for PE. Admitted for further evaluation and management. Went into A. fib with RVR in the 120s. Cardiology consulted. TCTS took over primary care. [facility] consulting for medical Mx of DM, HTN, AoCKD etc.   Assessment/Plan:  Atypical chest pain - Status post CABG on 05/23/15. - Mildly elevated troponin, 0.04 > 0.10-probably not significant. CTA chest: No pulmonary embolism - Chest pain resolved. - Management per cardiology/cardiothoracic surgery - May be related to recent surgical trauma/cartilage disruption.  Paroxysmal A. fib with RVR - Asymptomatic currently - Thoracic surgery started patient on Cardizem drip, beta blockers which was switched from carvedilol to metoprolol due to wheezing on admission. Cardiology input appreciated.  - Patient reverted to sinus rhythm. Cardizem being switched to oral. - anticoagulation decision defer to TCTS/cardiology  Hypokalemia - Replaced  Acute blood loss anemia - Stable over last 24 hours.   Acute on Stage III chronic kidney disease - Baseline creatinine probably in the 1.4 range. Creatinine has increased to 1.7-likely secondary to diuresis. Seems to be euvolemic clinically and chest x-ray without pulmonary edema. Follow BMP in a.m.   Uncontrolled type II DM with renal complications - DC metformin. Hold Amaryl. Continue  NovoLog SSI. Started low-dose Lantus 10/19 and CBGs better since last night .  OSA - Continue nightly CPAP.  Essential hypertension - Controlled  HLD - Statins  CAD status post CABG - Management per cardiology/cardiothoracic surgery  Leukocytosis - Possibly stress response. No clinical features suggestive of infection. Monitor   DVT prophylaxis: Subcutaneous Lovenox Code Status: Full Family Communication: None at bedside  Disposition Plan: DC home when medically stable and defer to TCTS   Consultants:  Cardiothoracic surgery  Cardiology  Procedures:  None  Antibiotics:  None   Subjective: Denies complaints  Objective: Filed Vitals:   05/30/15 1420 05/30/15 1447 05/30/15 2049 05/31/15 0515  BP: 122/59 122/59 138/58 125/63  Pulse: 99 99 86 75  Temp:   97.8 F (36.6 C) 97.5 F (36.4 C)  TempSrc:   Oral Oral  Resp:   16 16  Height:      Weight:    96.5 kg (212 lb 11.9 oz)  SpO2:  96% 100% 97%    Intake/Output Summary (Last 24 hours) at 05/31/15 0925 Last data filed at 05/30/15 2059  Gross per 24 hour  Intake    240 ml  Output   2800 ml  Net  -2560 ml   Filed Weights   05/29/15 2122 05/30/15 0300 05/31/15 0515  Weight: 98.884 kg (218 lb) 99.701 kg (219 lb 12.8 oz) 96.5 kg (212 lb 11.9 oz)     Exam:  General exam: Pleasant elderly male sitting up comfortably at edge of bed. Respiratory system: clear to auscultation. No increased work of breathing. Cardiovascular system: S1 & S2 heard, irregularly irregular and tachycardic. No JVD, murmurs, gallops, clicks or pedal edema. Telemetry: Reverted to sinus rhythm at 5:50 PM on  10/19.  Gastrointestinal system: Abdomen is nondistended, soft and nontender. Normal bowel sounds heard. Central nervous system: Alert and oriented. No focal neurological deficits. Extremities: Symmetric 5 x 5 power.   Data Reviewed: Basic Metabolic Panel:  Recent Labs Lab 05/24/15 1640  05/28/15 0237 05/29/15 0226  05/29/15 2155 05/30/15 0522 05/31/15 0318  NA  --   < > 136 135 130* 135 138  K  --   < > 3.6 3.4* 4.7 4.9 4.0  CL  --   < > 97* 99* 97* 99* 101  CO2  --   < > 28 25 21* 24 27  GLUCOSE  --   < > 80 114* 153* 229* 173*  BUN  --   < > 19 20 23* 24* 30*  CREATININE 1.78*  < > 1.58* 1.64* 1.53* 1.59* 1.71*  CALCIUM  --   < > 8.8* 8.7* 8.4* 8.8* 9.2  MG 1.9  --   --   --   --   --   --   < > = values in this interval not displayed. Liver Function Tests:  Recent Labs Lab 05/30/15 0522  AST 14*  ALT 18  ALKPHOS 56  BILITOT 0.8  PROT 5.8*  ALBUMIN 2.2*   No results for input(s): LIPASE, AMYLASE in the last 168 hours. No results for input(s): AMMONIA in the last 168 hours. CBC:  Recent Labs Lab 05/25/15 0500 05/26/15 0415 05/29/15 2155 05/30/15 0522 05/31/15 0318  WBC 16.3* 15.1* 16.3* 10.4 15.0*  NEUTROABS  --   --   --  9.9*  --   HGB 10.4* 10.7* 10.3* 9.7* 9.4*  HCT 31.1* 31.4* 30.4* 29.8* 28.7*  MCV 97.8 95.4 95.3 95.8 96.6  PLT 168 191 294 272 370   Cardiac Enzymes:  Recent Labs Lab 05/30/15 0522 05/30/15 1010 05/30/15 1642  TROPONINI 0.04* 0.10* 0.03   BNP (last 3 results)  Recent Labs  04/30/15 0957  PROBNP 6.0   CBG:  Recent Labs Lab 05/30/15 0625 05/30/15 1137 05/30/15 1626 05/30/15 2328 05/31/15 0613  GLUCAP 212* 314* 345* 188* 190*    Recent Results (from the past 240 hour(s))  Surgical pcr screen     Status: None   Collection Time: 05/21/15  1:31 PM  Result Value Ref Range Status   MRSA, PCR NEGATIVE NEGATIVE Final   Staphylococcus aureus NEGATIVE NEGATIVE Final    Comment:        The Xpert SA Assay (FDA approved for NASAL specimens in patients over 62 years of age), is one component of a comprehensive surveillance program.  Test performance has been validated by [facility] for patients greater than or equal to 46 year old. It is not intended to diagnose infection nor to guide or monitor treatment.             Studies: Dg Chest 2 View  05/31/2015  CLINICAL DATA:  Pleural effusion, postop EXAM: CHEST  2 VIEW COMPARISON:  05/30/2015 FINDINGS: Cardiomediastinal silhouette is stable. Status post CABG. Small bilateral pleural effusion. Mild degenerative changes thoracic spine. No infiltrate or pulmonary edema. IMPRESSION: Status post CABG. Small bilateral pleural effusion. No infiltrate or pulmonary edema. Electronically Signed   By: Lahoma Crocker M.D.   On: 05/31/2015 08:24   Dg Chest 2 View  05/29/2015  CLINICAL DATA:  Severe shortness of breath and chest pain. Recent CABG 6 days prior. EXAM: CHEST  2 VIEW COMPARISON:  05/26/2015 FINDINGS: Patient is post median sternotomy. Heart at the  upper limits normal in size. Small bilateral pleural effusions, decreased on the left and unchanged on the right. No pulmonary edema, focal consolidation or pneumothorax. The bones are under mineralized. IMPRESSION: Post CABG with small bilateral pleural effusions, decreased on the left and unchanged on the right from prior. Electronically Signed   By: Jeb Levering M.D.   On: 05/29/2015 22:29   Ct Angio Chest Pe W/cm &/or Wo Cm  05/30/2015  CLINICAL DATA:  Shortness of breath. Right and left-sided chest pain, onset 6 hr prior. Post recent CABG 6 days prior, discharged from the hospital earlier today. EXAM: CT ANGIOGRAPHY CHEST WITH CONTRAST TECHNIQUE: Multidetector CT imaging of the chest was performed using the standard protocol during bolus administration of intravenous contrast. Multiplanar CT image reconstructions and MIPs were obtained to evaluate the vascular anatomy. CONTRAST:  144mL OMNIPAQUE IOHEXOL 350 MG/ML SOLN COMPARISON:  Chest radiograph 2 hr prior. FINDINGS: There are no filling defects within the pulmonary arteries to suggest pulmonary embolus. Patient is post CABG with expected postsurgical change in the mediastinum with soft tissue stranding. Expected foci of air in the mediastinal fat  inferiorly. No mediastinal hematoma or abscess. There is atherosclerosis of the thoracic aorta without aneurysm. Small pericardial effusion measuring up to 1.5 cm in depth. No abnormal pericardial enhancement. There are small bilateral pleural effusions. No mediastinal or hilar adenopathy. Mild emphysematous change. Expected compressive atelectasis in the lower lobes adjacent to pleural effusions. No pneumonia or pulmonary edema. No pneumothorax. The esophagus is decompressed. Cholelithiasis noted in the included upper abdomen. Sternal wires appear intact, mild widening of the sternotomy inferiorly. There are no acute or suspicious osseous abnormalities. Review of the MIP images confirms the above findings. IMPRESSION: 1. No pulmonary embolus. 2. Small bilateral pleural effusions with adjacent compressive atelectasis. Small pericardial effusion. 3. Expected postsurgical change in the mediastinum post recent CABG. Electronically Signed   By: Jeb Levering M.D.   On: 05/30/2015 00:28        Scheduled Meds: . aspirin EC  81 mg Oral Daily  . atorvastatin  40 mg Oral q1800  . calcium-vitamin D  1 tablet Oral Daily  . cholecalciferol  2,000 Units Oral Daily  . enoxaparin (LOVENOX) injection  40 mg Subcutaneous Q24H  . ferrous gluconate  324 mg Oral BID WC  . insulin aspart  0-5 Units Subcutaneous QHS  . insulin aspart  0-9 Units Subcutaneous TID WC  . insulin glargine  5 Units Subcutaneous Daily  . metoprolol tartrate  25 mg Oral BID  . multivitamin with minerals  1 tablet Oral Daily  . pantoprazole  40 mg Oral Daily  . sodium chloride  3 mL Intravenous Q12H  . vitamin C  250 mg Oral Daily   Continuous Infusions: . diltiazem (CARDIZEM) infusion 12.5 mg/hr (05/31/15 0038)    Principal Problem:   Chest pain Active Problems:   Essential hypertension   Hyperlipidemia   Type 2 diabetes mellitus with vascular disease ([name])   OSA on CPAP   Pain in the chest   Atrial fibrillation with rapid  ventricular response ([place])    Time spent: 20 minutes.    Vernell Leep, MD, FACP, FHM. [facility] Pager [phone]  If 7PM-7AM, please contact night-coverage [email or web address] Password [email or web address] 05/31/2015, 9:25 AM    LOS: 1 day

## 2015-05-31 NOTE — Clinical Documentation Improvement (Signed)
Internal Medicine  Please try to clarify the likely etiology of the patient's "atypical chest pain" and update your documentation within the medical record to reflect your response to this query. Thank you!   CP secondary to PAF  CP secondary to atelectasis  CP secondary to bilateral pleural effusion  Atypical CP as documented  Other  Clinically Undetermined  Supporting Information:  This query is being asked to capture the specificity of admission  Please exercise your independent, professional judgment when responding. A specific answer is not anticipated or expected.  Thank You, Zoila Shutter RN, [place] [phone]

## 2015-05-31 NOTE — Progress Notes (Signed)
Patient Name: Bradley [name] Date of Encounter: 05/31/2015  Principal Problem:   Chest pain Active Problems:   Essential hypertension   Hyperlipidemia   Type 2 diabetes mellitus with vascular disease ([name])   OSA on CPAP   Pain in the chest   Atrial fibrillation with rapid ventricular response ([place])    SUBJECTIVE  Feeling well. No chest pain, sob or palpitations. Converted to sinus rhythm yesterday.   CURRENT MEDS . aspirin EC  81 mg Oral Daily  . atorvastatin  40 mg Oral q1800  . calcium-vitamin D  1 tablet Oral Daily  . cholecalciferol  2,000 Units Oral Daily  . diltiazem  75 mg Oral 4 times per day  . enoxaparin (LOVENOX) injection  40 mg Subcutaneous Q24H  . ferrous gluconate  324 mg Oral BID WC  . insulin aspart  0-5 Units Subcutaneous QHS  . insulin aspart  0-9 Units Subcutaneous TID WC  . insulin glargine  5 Units Subcutaneous Daily  . metoprolol tartrate  25 mg Oral BID  . multivitamin with minerals  1 tablet Oral Daily  . pantoprazole  40 mg Oral Daily  . sodium chloride  3 mL Intravenous Q12H  . vitamin C  250 mg Oral Daily    OBJECTIVE  Filed Vitals:   05/30/15 2049 05/31/15 0515 05/31/15 1008 05/31/15 1336  BP: 138/58 125/63 131/65 145/61  Pulse: 86 75 88 80  Temp: 97.8 F (36.6 C) 97.5 F (36.4 C)  98.7 F (37.1 C)  TempSrc: Oral Oral  Oral  Resp: 16 16  17   Height:      Weight:  212 lb 11.9 oz (96.5 kg)    SpO2: 100% 97%  99%    Intake/Output Summary (Last 24 hours) at 05/31/15 1342 Last data filed at 05/31/15 1300  Gross per 24 hour  Intake    840 ml  Output   2150 ml  Net  -1310 ml   Filed Weights   05/29/15 2122 05/30/15 0300 05/31/15 0515  Weight: 218 lb (98.884 kg) 219 lb 12.8 oz (99.701 kg) 212 lb 11.9 oz (96.5 kg)    PHYSICAL EXAM  General: Pleasant, NAD. Neuro: Alert and oriented X 3. Moves all extremities spontaneously. Psych: Normal affect. HEENT:  Normal  Neck: Supple without bruits or JVD.  Lungs:  Resp regular and  unlabored. Mild diminished breath sound bibasilar.  Heart: RRR no s3, s4, or murmurs. Abdomen: Soft, non-tender, non-distended, BS + x 4.  Extremities: No clubbing, cyanosis or edema. DP/PT/Radials 2+ and equal bilaterally.  Accessory Clinical Findings  CBC  Recent Labs  05/30/15 0522 05/31/15 0318  WBC 10.4 15.0*  NEUTROABS 9.9*  --   HGB 9.7* 9.4*  HCT 29.8* 28.7*  MCV 95.8 96.6  PLT 272 0000000   Basic Metabolic Panel  Recent Labs  05/30/15 0522 05/31/15 0318  NA 135 138  K 4.9 4.0  CL 99* 101  CO2 24 27  GLUCOSE 229* 173*  BUN 24* 30*  CREATININE 1.59* 1.71*  CALCIUM 8.8* 9.2   Liver Function Tests  Recent Labs  05/30/15 0522  AST 14*  ALT 18  ALKPHOS 56  BILITOT 0.8  PROT 5.8*  ALBUMIN 2.2*   No results for input(s): LIPASE, AMYLASE in the last 72 hours. Cardiac Enzymes  Recent Labs  05/30/15 0522 05/30/15 1010 05/30/15 1642  TROPONINI 0.04* 0.10* 0.03     Recent Labs  05/31/15 0318  CHOL 83  HDL 26*  LDLCALC 36  TRIG 104  CHOLHDL 3.2    TELE  Sinus rhythm at rate of 70-80s.   Radiology/Studies  Dg Chest 2 View  05/31/2015  CLINICAL DATA:  Pleural effusion, postop EXAM: CHEST  2 VIEW COMPARISON:  05/30/2015 FINDINGS: Cardiomediastinal silhouette is stable. Status post CABG. Small bilateral pleural effusion. Mild degenerative changes thoracic spine. No infiltrate or pulmonary edema. IMPRESSION: Status post CABG. Small bilateral pleural effusion. No infiltrate or pulmonary edema. Electronically Signed   By: Lahoma Crocker M.D.   On: 05/31/2015 08:24   Dg Chest 2 View  05/29/2015  CLINICAL DATA:  Severe shortness of breath and chest pain. Recent CABG 6 days prior. EXAM: CHEST  2 VIEW COMPARISON:  05/26/2015 FINDINGS: Patient is post median sternotomy. Heart at the upper limits normal in size. Small bilateral pleural effusions, decreased on the left and unchanged on the right. No pulmonary edema, focal consolidation or pneumothorax. The bones  are under mineralized. IMPRESSION: Post CABG with small bilateral pleural effusions, decreased on the left and unchanged on the right from prior. Electronically Signed   By: Jeb Levering M.D.   On: 05/29/2015 22:29   Dg Chest 2 View  05/26/2015  CLINICAL DATA:  Status post CABG, chest tube removal EXAM: CHEST  2 VIEW COMPARISON:  05/25/2015 FINDINGS: Interval removal of left chest tube and right IJ venous sheath. Low lung volumes. No focal consolidation. No pleural effusion or pneumothorax. The heart is top-normal in size for inspiration. Postsurgical changes related to prior CABG. Overlying lead/wires. IMPRESSION: No evidence of acute cardiopulmonary disease. Electronically Signed   By: Julian Hy M.D.   On: 05/26/2015 09:23   Dg Chest 2 View  05/11/2015  CLINICAL DATA:  Pre procedure respiratory exam. For heart catheterization on 05/15/2015. Abnormal stress test. EXAM: CHEST  2 VIEW COMPARISON:  Chest x-rays dated 03/16/2013 and 07/30/2011 FINDINGS: Heart size and pulmonary vascularity are normal. No infiltrates or effusions. Chronic peribronchial thickening. No acute osseous abnormality. Calcification in the thoracic aorta. Surgical clips in the left side of the neck. IMPRESSION: No acute abnormalities. Chronic bronchitic changes. Aortic atherosclerosis. Electronically Signed   By: Lorriane Shire M.D.   On: 05/11/2015 14:40   Ct Angio Chest Pe W/cm &/or Wo Cm  05/30/2015  CLINICAL DATA:  Shortness of breath. Right and left-sided chest pain, onset 6 hr prior. Post recent CABG 6 days prior, discharged from the hospital earlier today. EXAM: CT ANGIOGRAPHY CHEST WITH CONTRAST TECHNIQUE: Multidetector CT imaging of the chest was performed using the standard protocol during bolus administration of intravenous contrast. Multiplanar CT image reconstructions and MIPs were obtained to evaluate the vascular anatomy. CONTRAST:  1107mL OMNIPAQUE IOHEXOL 350 MG/ML SOLN COMPARISON:  Chest radiograph 2 hr  prior. FINDINGS: There are no filling defects within the pulmonary arteries to suggest pulmonary embolus. Patient is post CABG with expected postsurgical change in the mediastinum with soft tissue stranding. Expected foci of air in the mediastinal fat inferiorly. No mediastinal hematoma or abscess. There is atherosclerosis of the thoracic aorta without aneurysm. Small pericardial effusion measuring up to 1.5 cm in depth. No abnormal pericardial enhancement. There are small bilateral pleural effusions. No mediastinal or hilar adenopathy. Mild emphysematous change. Expected compressive atelectasis in the lower lobes adjacent to pleural effusions. No pneumonia or pulmonary edema. No pneumothorax. The esophagus is decompressed. Cholelithiasis noted in the included upper abdomen. Sternal wires appear intact, mild widening of the sternotomy inferiorly. There are no acute or suspicious osseous abnormalities. Review of the MIP images  confirms the above findings. IMPRESSION: 1. No pulmonary embolus. 2. Small bilateral pleural effusions with adjacent compressive atelectasis. Small pericardial effusion. 3. Expected postsurgical change in the mediastinum post recent CABG. Electronically Signed   By: Jeb Levering M.D.   On: 05/30/2015 00:28   Ct Abdomen Pelvis W Contrast  05/09/2015  CLINICAL DATA:  69 year old male with left-sided abdominal pain for the past 3 weeks. Nausea. History of diverticulitis. EXAM: CT ABDOMEN AND PELVIS WITH CONTRAST TECHNIQUE: Multidetector CT imaging of the abdomen and pelvis was performed using the standard protocol following bolus administration of intravenous contrast. CONTRAST:  11mL ISOVUE-300 IOPAMIDOL (ISOVUE-300) INJECTION 61% COMPARISON:  CT the abdomen and pelvis 12/12/2013. FINDINGS: Lower chest: Atherosclerotic calcifications in the distal right coronary artery. Hepatobiliary: No cystic or solid hepatic lesions. No intra or extrahepatic biliary ductal dilatation. High attenuation  material layering dependently in the gallbladder, compatible with small gallstones. No evidence of acute cholecystitis at this time. Pancreas: No pancreatic mass. No pancreatic ductal dilatation. No pancreatic or peripancreatic fluid or inflammatory changes. Spleen: Unremarkable. Adrenals/Urinary Tract: Bilateral adrenal glands are normal in appearance. Two calcifications are present within the left renal collecting system, largest of which measures 8 mm in the lower pole. 5.9 cm exophytic low-attenuation lesion in the lower pole of the right kidney is compatible with a cyst. Multiple clustered small low-attenuation lesions in the lower pole of the left kidney, largest of which measures 2 cm in diameter and is compatible with a simple cysts. The smaller lesions in this cluster are too small to definitively characterize, however, this area overall appears similar to the prior study, presumably multiple small cysts. Cortical thinning in the lower pole of the left kidney, compatible with chronic scarring. No hydroureteronephrosis to suggest urinary tract obstruction at this time. Urinary bladder is normal in appearance. Stomach/Bowel: Normal appearance of the stomach. No pathologic dilatation of small bowel or colon. Scattered colonic diverticulae are noted, most evident in the sigmoid colon, without surrounding inflammatory changes to suggest an acute diverticulitis at this time. Normal appendix. Vascular/Lymphatic: Extensive atherosclerosis throughout the abdominal and pelvic vasculature, without evidence of aneurysm or dissection. Aortobifemoral bypass graft noted, widely patent at this time. The native infrarenal abdominal aorta and proximal common iliac arteries are occluded bilaterally. The left common iliac artery is distally reconstituted, presumably secondary to collateral flow. Bilateral external iliac arteries demonstrate some limited flow, also presumably from collateral flow. High-grade stenosis versus  complete occlusion of the left superficial femoral artery. Probable high-grade stenosis of the right superficial femoral artery is well. Celiac axis and superior mesenteric artery are both widely patent. Inferior mesenteric artery branches appear patent, however, the origin of the vessel is presumably occluded (arising off the occluded native infrarenal abdominal aorta). No lymphadenopathy noted in the abdomen or pelvis. Reproductive: Prostate gland and seminal vesicles are unremarkable in appearance. Other: No significant volume of ascites.  No pneumoperitoneum. Musculoskeletal: Status post PLIF at L5-S1. There are no aggressive appearing lytic or blastic lesions noted in the visualized portions of the skeleton. IMPRESSION: 1. No acute findings in the abdomen or pelvis to account for the patient's symptoms. 2. Extensive colonic diverticulosis, without evidence to suggest acute diverticulitis at this time. 3. Extensive atherosclerosis status post aortobifemoral bypass graft placement, with multiple vascular findings (most notable for complete occlusion or high-grade stenosis of the left superficial femoral artery, and high-grade stenosis of the proximal right superficial femoral artery), as detailed above. 4. Normal appendix. 5. Additional incidental findings, as above. Electronically Signed  By: Vinnie Langton M.D.   On: 05/09/2015 16:19   Dg Chest Port 1 View  05/25/2015  CLINICAL DATA:  Postop from CABG. EXAM: PORTABLE CHEST 1 VIEW COMPARISON:  05/24/2015 FINDINGS: Swan-Ganz catheter has been removed. Right jugular Cordis remains in place as well as left-sided chest tube. No pneumothorax visualized. Low lung volumes again noted. Mild left upper lobe atelectasis is stable. No evidence of pulmonary consolidation or definite pleural effusion. Heart size remains within normal limits. IMPRESSION: No significant change in low lung volumes and mild left upper lobe atelectasis. No pneumothorax visualized.  Electronically Signed   By: Earle Gell M.D.   On: 05/25/2015 07:59   Dg Chest Port 1 View  05/24/2015  CLINICAL DATA:  Status post CABG EXAM: PORTABLE CHEST 1 VIEW COMPARISON:  Portable chest x-ray of May 23, 2015 FINDINGS: For lungs remain mildly hypoinflated. There has been interval extubation of the trachea and of the esophagus. The pulmonary interstitial markings remain increased. Subsegmental atelectasis in the left mid lung persists. No pneumothorax is evident. The left-sided chest tube and a left-sided mediastinal drain are un changed in position. The Swan-Ganz catheter tip projects over the proximal right main pulmonary artery. The cardiac silhouette is top-normal in size. The pulmonary vascularity is more normal in appearance today. IMPRESSION: Improving appearance of the pulmonary interstitium consistent with decreasing interstitial edema. No pneumothorax or significant pleural effusion. The support tubes are in reasonable position. Electronically Signed   By: David  Martinique M.D.   On: 05/24/2015 08:03   Dg Chest Port 1 View  05/23/2015  CLINICAL DATA:  Post CABG EXAM: PORTABLE CHEST 1 VIEW COMPARISON:  05/11/2015 FINDINGS: Endotracheal tube is 5 cm above the carina. Left chest tube in place. No pneumothorax. Swan-Ganz catheter tip in the right pulmonary artery centrally. NG tube coils in the stomach. Low lung volumes with areas of atelectasis in the left lung. No confluent opacity on the right. No visible effusions. IMPRESSION: Postoperative changes with support devices as above. No pneumothorax. Areas of left lung atelectasis. Low lung volumes. Electronically Signed   By: Rolm Baptise M.D.   On: 05/23/2015 14:53    ASSESSMENT AND PLAN  1. Afib with RVR - CHADSVASC score of at least 4 (AGE, HTN, CAD and DM). He went into a fib with RVR during recent admission and was put on an Amiodarone drip. He converted to sinus rhythm.  - He again went to Afib with RVR tested and converted to sinus  rhythm on dilt gtt yesterday evening. Mainitaining sinus rhythm. - He was on cardizem drip at rate of 12.6ml/hr. Will discontinue IV dilt and start on Cardizem 75mg  q 6 hours. Switch to Cardizem CD 300mg  tomorrow.  Continue metoprolol 25mg  BID.  - EF of 55-60% on echo 04/2015. - If ok with surgery consider full dose anticoagulation rather than DVT prophylactic dose  2. Chest pain/ CAD s/p CABG x 3 05/23/15 - Presented with chest discomfort. Trop trend 0.04->0.1->0.03. - Currently chest pain free. - CTA without PE or aortic discussion - CTCS is following  3. Elevated creatinine - Cr of 1.53-->1.59-->1.71. (has been around 1.5-1.7 during last admission 10/12-10/18) - Could be from contrast - He looks dry. Consider IV fluids.   4. Essential hypertension - Relative stable  5. Hyperlipidemia - Continue lipitor 40mg . - No Lipid panel in last year. Will getlipid panel tomorrow.   6. Type 2 diabetes mellitus with vascular disease ([place]) - Per primary  7. OSA on CPAP  8.  Small bilateral pleural effusions - No sign of CHF. Lungs to CTA on exam.  - Repeat xray stable today.   9. Acute blood loss anemia  - Hemoglobin of 9.7->9.4. On [place]. - Monitor closely.    Jarrett Soho PA-C Pager [phone]   Patient seen and examined. Agree with assessment and plan. Feels better. Converted to NSR; iv cardizem dc'd and transitioning to oral therapy. Now on metoprolol rather than coreg; wheezing resolved.   Troy Sine, MD, [facility] 05/31/2015 3:04 PM

## 2015-06-01 DIAGNOSIS — G4733 Obstructive sleep apnea (adult) (pediatric): Secondary | ICD-10-CM

## 2015-06-01 DIAGNOSIS — I4891 Unspecified atrial fibrillation: Secondary | ICD-10-CM

## 2015-06-01 DIAGNOSIS — I25709 Atherosclerosis of coronary artery bypass graft(s), unspecified, with unspecified angina pectoris: Secondary | ICD-10-CM | POA: Insufficient documentation

## 2015-06-01 DIAGNOSIS — I2510 Atherosclerotic heart disease of native coronary artery without angina pectoris: Principal | ICD-10-CM

## 2015-06-01 DIAGNOSIS — R079 Chest pain, unspecified: Secondary | ICD-10-CM

## 2015-06-01 DIAGNOSIS — I10 Essential (primary) hypertension: Secondary | ICD-10-CM

## 2015-06-01 LAB — BASIC METABOLIC PANEL
Anion gap: 9 (ref 5–15)
BUN: 28 mg/dL — ABNORMAL HIGH (ref 6–20)
CO2: 29 mmol/L (ref 22–32)
Calcium: 8.6 mg/dL — ABNORMAL LOW (ref 8.9–10.3)
Chloride: 98 mmol/L — ABNORMAL LOW (ref 101–111)
Creatinine, Ser: 1.66 mg/dL — ABNORMAL HIGH (ref 0.61–1.24)
GFR calc Af Amer: 46 mL/min — ABNORMAL LOW (ref >60)
GFR calc non Af Amer: 40 mL/min — ABNORMAL LOW (ref >60)
Glucose, Bld: 172 mg/dL — ABNORMAL HIGH (ref 65–99)
Potassium: 3.9 mmol/L (ref 3.5–5.1)
Sodium: 136 mmol/L (ref 135–145)

## 2015-06-01 LAB — GLUCOSE, CAPILLARY
Glucose-Capillary: 155 mg/dL — ABNORMAL HIGH (ref 65–99)
Glucose-Capillary: 213 mg/dL — ABNORMAL HIGH (ref 65–99)
Glucose-Capillary: 190 mg/dL — ABNORMAL HIGH (ref 65–99)
Glucose-Capillary: 253 mg/dL — ABNORMAL HIGH (ref 65–99)

## 2015-06-01 LAB — LIPID PANEL
Cholesterol: 82 mg/dL (ref 0–200)
HDL: 24 mg/dL — ABNORMAL LOW (ref >40)
LDL Cholesterol: 26 mg/dL (ref 0–99)
Total CHOL/HDL Ratio: 3.4 RATIO
Triglycerides: 158 mg/dL — ABNORMAL HIGH (ref <150)
VLDL: 32 mg/dL (ref 0–40)

## 2015-06-01 LAB — CBC
HCT: 29.7 % — ABNORMAL LOW (ref 39.0–52.0)
Hemoglobin: 9.5 g/dL — ABNORMAL LOW (ref 13.0–17.0)
MCH: 31.3 pg (ref 26.0–34.0)
MCHC: 32 g/dL (ref 30.0–36.0)
MCV: 97.7 fL (ref 78.0–100.0)
Platelets: 374 10*3/uL (ref 150–400)
RBC: 3.04 MIL/uL — ABNORMAL LOW (ref 4.22–5.81)
RDW: 13.7 % (ref 11.5–15.5)
WBC: 8.6 10*3/uL (ref 4.0–10.5)

## 2015-06-01 MED ORDER — INSULIN GLARGINE 100 UNIT/ML ~~LOC~~ SOLN
8.0000 [IU] | Freq: Every day | SUBCUTANEOUS | Status: DC
  Administered 2015-06-02 – 2015-06-04 (×3): 8 [IU] via SUBCUTANEOUS
  Filled 2015-06-01 (×3): qty 0.08

## 2015-06-01 NOTE — Care Management Important Message (Signed)
Important Message  Patient Details  Name: Bradley [name] MRN: [id number] Date of Birth: 04-04-1946   Medicare Important Message Given:  Yes-second notification given    [name] May 06/01/2015, 11:54 AM

## 2015-06-01 NOTE — Discharge Instructions (Signed)
Activity: 1.May walk up steps                2.No lifting more than ten pounds for four weeks.                 3.No driving for four weeks.                4.Stop any activity that causes chest pain, shortness of breath, dizziness, sweating or excessive weakness.                5.Avoid straining.                6.Continue with your breathing exercises daily.  Diet: Diabetic diet and Low fat, Low salt diet  Wound Care: May shower.  Clean wounds with mild soap and water daily. Contact the office at [phone] if any problems arise.  Coronary Artery Bypass Grafting, Care After Refer to this sheet in the next few weeks. These instructions provide you with information on caring for yourself after your procedure. Your health care provider may also give you more specific instructions. Your treatment has been planned according to current medical practices, but problems sometimes occur. Call your health care provider if you have any problems or questions after your procedure. WHAT TO EXPECT AFTER THE PROCEDURE Recovery from surgery will be different for everyone. Some people feel well after 3 or 4 weeks, while for others it takes longer. After your procedure, it is typical to have the following:  Nausea and a lack of appetite.   Constipation.  Weakness and fatigue.   Depression or irritability.   Pain or discomfort at your incision site. HOME CARE INSTRUCTIONS  Take medicines only as directed by your health care provider. Do not stop taking medicines or start any new medicines without first checking with your health care provider.  Take your pulse as directed by your health care provider.  Perform deep breathing as directed by your health care provider. If you were given a device called an incentive spirometer, use it to practice deep breathing several times a day. Support your chest with a pillow or your arms when you take deep breaths or cough.  Keep incision areas clean, dry, and  protected. Remove or change any bandages (dressings) only as directed by your health care provider. You may have skin adhesive strips over the incision areas. Do not take the strips off. They will fall off on their own.  Check incision areas daily for any swelling, redness, or drainage.  If incisions were made in your legs, do the following:  Avoid crossing your legs.   Avoid sitting for long periods of time. Change positions every 30 minutes.   Elevate your legs when you are sitting.  Wear compression stockings as directed by your health care provider. These stockings help keep blood clots from forming in your legs.  Take showers once your health care provider approves. Until then, only take sponge baths. Pat incisions dry. Do not rub incisions with a washcloth or towel. Do not take baths, swim, or use a hot tub until your health care provider approves.  Eat foods that are high in fiber, such as raw fruits and vegetables, whole grains, beans, and nuts. Meats should be lean cut. Avoid canned, processed, and fried foods.  Drink enough fluid to keep your urine clear or pale yellow.  Weigh yourself every day. This helps identify if you are retaining fluid that may make your heart  and lungs work harder.  Rest and limit activity as directed by your health care provider. You may be instructed to:  Stop any activity at once if you have chest pain, shortness of breath, irregular heartbeats, or dizziness. Get help right away if you have any of these symptoms.  Move around frequently for short periods or take short walks as directed by your health care provider. Increase your activities gradually. You may need physical therapy or cardiac rehabilitation to help strengthen your muscles and build your endurance.  Avoid lifting, pushing, or pulling anything heavier than 10 lb (4.5 kg) for at least 6 weeks after surgery.  Do not drive until your health care provider approves.  Ask your health  care provider when you may return to work.  Ask your health care provider when you may resume sexual activity.  Keep all follow-up visits as directed by your health care provider. This is important. SEEK MEDICAL CARE IF:  You have swelling, redness, increasing pain, or drainage at the site of an incision.  You have a fever.  You have swelling in your ankles or legs.  You have pain in your legs.   You gain 2 or more pounds (0.9 kg) a day.  You are nauseous or vomit.  You have diarrhea. SEEK IMMEDIATE MEDICAL CARE IF:  You have chest pain that goes to your jaw or arms.  You have shortness of breath.   You have a fast or irregular heartbeat.   You notice a "clicking" in your breastbone (sternum) when you move.   You have numbness or weakness in your arms or legs.  You feel dizzy or light-headed.  MAKE SURE YOU:  Understand these instructions.  Will watch your condition.  Will get help right away if you are not doing well or get worse.   This information is not intended to replace advice given to you by your health care provider. Make sure you discuss any questions you have with your health care provider.   Document Released: 02/14/2005 Document Revised: 08/18/2014 Document Reviewed: 01/04/2013 Elsevier Interactive Patient Education [employer].

## 2015-06-01 NOTE — Progress Notes (Signed)
Inpatient Diabetes Program Recommendations  AACE/ADA: New Consensus Statement on Inpatient Glycemic Control (2015)  Target Ranges:  Prepandial:   less than 140 mg/dL      Peak postprandial:   less than 180 mg/dL (1-2 hours)      Critically ill patients:  140 - 180 mg/dL   Review of Glycemic Control  Inpatient Diabetes Program Recommendations:  Insulin - Meal Coverage: consider adding Novolog 4 units TID with meals  Thank you  Raoul Pitch BSN, RN,CDE Inpatient Diabetes Coordinator [phone] (team pager)

## 2015-06-01 NOTE — Discharge Summary (Signed)
[address]       [place] [address]             [phone]              Discharge Summary  Name: Bradley [name] DOB: 19-Nov-1945 69 y.o. MRN: [id number]   Admission Date: 05/29/2015 Discharge Date: 06/02/2015  Admitting Diagnosis: 1.History of CAD (coronary artery disease) (s/p MI 14') 2. History of DM 3. History of hyperlipidemia 4. History of hypertension 5. History of PVD 6. History of stroke 7. History of OSA 8. History of GERD 9. History of anemia 10. Right internal carotid artery stenosis (50-69% per duplex) 11. History of tobacco abuse  Discharge Diagnosis:  1.History of CAD (coronary artery disease) (s/p MI 14') 2. History of DM 3. History of hyperlipidemia 4. History of hypertension 5. History of PVD 6. History of stroke 7. History of OSA 8. History of GERD 9. History of anemia 10. Right internal carotid artery stenosis (50-69% per duplex) 11. History of tobacco abuse 12. Expected postoperative blood loss anemia 13. Mild postoperative ileus 14. A fib with RVR (converted to sinus rhythm) 15. Mildly elevated creatinine (baseline around 1.48)  Past Medical History  Diagnosis Date  . Hyperlipidemia   . Hypertension   . Diabetes mellitus age 55  . Peripheral vascular disease ([facility])   . Arthritis   . Reflux esophagitis   . Stroke [facility]) 1987    Right brain stroke  . Neuropathy ([place]) 2013  . Osteoporosis 2013  . Sleep apnea     uses CPAP  . GERD (gastroesophageal reflux disease)   . H/O hiatal hernia   . Irritable bowel syndrome (IBS)   . Myocardial infarction [facility])     silent inferior MI; patient denies MI history (03/17/13)   . Anemia   . Carotid artery occlusion   . Coronary artery disease     cath 05/17/2015 90% mid LAD, 80% ost D2, 75% mid RCA, 35% prox RCA. CT surgery consulted  . Shortness of breath dyspnea   . Diverticulitis   . Wears glasses       Procedures: CORONARY ARTERY BYPASS GRAFTING x 3 -  05/23/2015  Left internal mammary artery to left anterior descending   Saphenous vein graft to diagonal   Saphenous vein graft to posterior descending  Endoscopic greater saphenous vein harvest right thigh    HPI:  The patient is a 69 y.o. male with multiple medical comorbidities including history of CVA, hypertension, type 2 diabetes mellitus, hyperlipidemia, obstructive sleep apnea, right bundle branch block. He also has a history of a extensive peripheral arterial disease with claudication as well as extracranial cerebrovascular occlusive disease s/p L CEA in 1994 with redo in 2001. He has a history of an aortoiliac occlusive disease s/p aorto-bifemoral bypass in 1998. He was recently referred to cardiology for evaluation of coronary artery disease. The patient had noted a several week history of shortness of breath on exertion.  A nuclear myocardial perfusion study was positive intermediate risk with EF 52%.Cardiac cath was performed by Dr. Tamala Julian and revealed high grade stenosis of the mid LAD at trifurcating diagonal branches. In addition, he had a mid 70% obstruction. LV function was well preserved. These lesions were felt to be too complex for PCI and therefore high risk. The patient was referred to Dr. Servando Snare for cardiac surgery evaluation. All risks, benefits and alternatives of surgery were explained in detail, and the patient agreed to proceed.  Hospital Course:  The patient was admitted to [facility] on 05/29/2015. The patient was taken to the operating room and underwent the above procedure.    He was extubated early on and was off all pressors on postop day 1. By postop day 2, he was able to be transferred to the stepdown unit.  He did have some abdominal distention and a mild ileus on x-ray which was treated conservatively. His diet was advanced slowly and his ileus did resolve. His creatinine was mildly elevated post op. Last creatinine was 1.68. His baseline creatinine is around  1.5He went into a fib with RVR and was put on an Amiodarone drip. He converted to sinus rhythm. He is ambulating on room air. He is tolerating a diet and has had a bowel movement. His glucose has been fairly well controlled. He is on Insulin and Amaryl only as his creatinine is still mildly elevated. He will be restarted on Metformin at discharge. His pre op HGA1C was 8.1 so he will need close follow up after discharge with his medical doctor. Epicardial pacing wires will be removed in the am. Chest tube sutures will be removed the day of discharge. He is felt surgically stable for discharge on 05/29/2015. Unfortunately, he was readmitted on 05/30/2015 with a fib with RVR and "chest pain". He had chest discomfort like something popped out of his bedside of his anterior chest wall and also some abnormal sensation on the right chest which is becoming more intense and pressure-like with diaphoresis. EMS was called and patient was brought to the ER. Patient was given sublingual nitroglycerin and nebulizer admission also was wheezing. Following which patient's symptoms improved. Troponin was mildly elevated. Chest x ray showed small bilateral pleural effusions. CT of chest was negative for PE, small bilateral pleural effusions with adjacent compressive atelectasis. He was started on Cardizem drip for a fib with RVR. We wanted to avoid Amiodarone as he had a fair amount of wheezing. Cardiology was consulted to help manage his a fib. His Coreg was stopped and he was started on Lopressor. He converted to sinus rhythm. He also had disruption of his cartilage to the mid left of his sternal incision, which is why he felt a popping. His sternum has no instability at this time. He is felt surgically stable for discharge.  Recent vital signs:  Filed Vitals:   05/29/15 0959  BP: 125/75  Pulse: 86  Temp: 98.1 (36.7)  Resp: 20    Recent laboratory studies:  CBC:  Recent Labs  05/31/15 0318 06/01/15 0315  WBC  15.0* 8.6  HGB 9.4* 9.5*  HCT 28.7* 29.7*  PLT 370 374   BMET:   Recent Labs  05/31/15 0318 06/01/15 0315  NA 138 136  K 4.0 3.9  CL 101 98*  CO2 27 29  GLUCOSE 173* 172*  BUN 30* 28*  CREATININE 1.71* 1.66*  CALCIUM 9.2 8.6*    PT/INR:   Recent Labs  05/29/15 2155  LABPROT 14.9  INR 1.15   Discharge Medications:     The patient has been discharged on:  1.Beta Blocker: Yes [x  ]  No [ ]   If No, reason:    2.Ace Inhibitor/ARB: Yes [ ]   No [x  ]  If No, reason: Elevated creatinine   3.Statin: Yes [x  ]  No [ ]   If No, reason:    4.Ecasa: Yes [ x ]  No [ ]   If No, reason:   Discharge Instructions:  The patient is to refrain from driving, heavy lifting or strenuous activity.  May shower daily and clean incisions with soap and water.  May resume regular diet.   Follow Up: Follow-up Information    Follow up with Grace Isaac, MD On 06/18/2015.   Specialty:  Cardiothoracic Surgery   Why:  PA/LAT CXR to be taken (at [facility] which is in the same building as Dr. Everrett Coombe office) on 06/18/2015 at 12:15 pm;Appointment time is at 1:00 with physician assistant   Contact information:   [place] [address] [phone]       Follow up with Richardson Dopp, PA-C On 06/13/2015.   Specialties:  Physician Assistant, Radiology, Interventional Cardiology   Why:  Appointment time is at 11:10 am   Contact information:   [address] [place] [address] [phone]       Follow up with  Melinda Crutch, MD.   Specialty:  Family Medicine   Why:  Call for a follow up appointment regarding further diabetes management and surveillance of HGA1C 8.1   Contact information:   [place] [address] [phone]        Medication List    STOP taking these medications        amiodarone 400 MG tablet  Commonly known as:  PACERONE     amLODipine 10 MG tablet  Commonly known as:   NORVASC     carvedilol 6.25 MG tablet  Commonly known as:  COREG      TAKE these medications        aspirin 81 MG EC tablet  Take 1 tablet (81 mg total) by mouth daily.     atorvastatin 40 MG tablet  Commonly known as:  LIPITOR  Take 40 mg by mouth daily.     Calcium-D 600-400 MG-UNIT Tabs  Take 1 tablet by mouth daily.     D-3-5 5000 UNITS capsule  Generic drug:  Cholecalciferol  Take 5,000 Units by mouth daily.     DEXILANT 60 MG capsule  Generic drug:  dexlansoprazole  Take 60 mg by mouth daily.     diltiazem 300 MG 24 hr capsule  Commonly known as:  CARDIZEM CD  Take 1 capsule (300 mg total) by mouth daily.     glimepiride 2 MG tablet  Commonly known as:  AMARYL  Take 2 mg by mouth daily before breakfast.     IRON (FERROUS GLUCONATE) PO  Take 40 mg by mouth 2 (two) times daily.     metFORMIN 500 MG 24 hr tablet  Commonly known as:  GLUCOPHAGE-XR  Take 2 tablets (1,000 mg total) by mouth 2 (two) times daily.     metoprolol 50 MG tablet  Commonly known as:  LOPRESSOR  Take 1 tablet (50 mg total) by mouth 2 (two) times daily.     multivitamin capsule  Take 1 capsule by mouth daily.     traMADol 50 MG tablet  Commonly known as:  ULTRAM  Take 1 tablet (50 mg total) by mouth every 6 (six) hours as needed for moderate pain.     traMADol 50 MG tablet  Commonly known as:  ULTRAM  Take 1 tablet (50 mg total) by mouth every 6 (six) hours as needed for moderate pain.     VICTOZA [place]  Inject 1.8 mg into the skin daily.     vitamin C 100 MG tablet  Take 100 mg by  mouth daily.        ZIMMERMAN,DONIELLE M PA-C 06/01/2015, 10:52 AM

## 2015-06-01 NOTE — Progress Notes (Signed)
Nursing note  Pt ambulated in hallway with walker. Back in room will monitor patient. Trevian Hayashida, Bettina Gavia RN

## 2015-06-01 NOTE — Progress Notes (Addendum)
Patient Profile: 69 y/o male with recent CABG complicated by post-op AFib, treated with amiodarone. He was discharged on 05/29/15 and presented back the following day with recurrent symptomatic AFib w/ RVR.   Subjective: Feels better. Denies any chest pain or dyspnea. No palpitations.   Objective: Vital signs in last 24 hours: Temp:  [97.5 F (36.4 C)-98.7 F (37.1 C)] 97.5 F (36.4 C) (10/21 0610) Pulse Rate:  [78-88] 79 (10/21 1035) Resp:  [17-18] 18 (10/20 1949) BP: (132-145)/(54-74) 133/74 mmHg (10/21 1035) SpO2:  [95 %-99 %] 95 % (10/21 0610) Last BM Date: 05/31/15  Intake/Output from previous day: 10/20 0701 - 10/21 0700 In: 600 [P.O.:600] Out: 3325 [Urine:3325] Intake/Output this shift: Total I/O In: 360 [P.O.:360] Out: 500 [Urine:500]  Medications Current Facility-Administered Medications  Medication Dose Route Frequency Provider Last Rate Last Dose  . acetaminophen (TYLENOL) tablet 650 mg  650 mg Oral Q6H PRN Rise Patience, MD   650 mg at 05/30/15 2100   Or  . acetaminophen (TYLENOL) suppository 650 mg  650 mg Rectal Q6H PRN Rise Patience, MD      . aspirin EC tablet 81 mg  81 mg Oral Daily Rise Patience, MD   81 mg at 06/01/15 1004  . atorvastatin (LIPITOR) tablet 40 mg  40 mg Oral q1800 Rise Patience, MD   40 mg at 05/31/15 1832  . calcium-vitamin D (OSCAL WITH D) 500-200 MG-UNIT per tablet 1 tablet  1 tablet Oral Daily Rise Patience, MD   1 tablet at 06/01/15 1004  . cholecalciferol (VITAMIN D) tablet 2,000 Units  2,000 Units Oral Daily Rise Patience, MD   2,000 Units at 06/01/15 1004  . diltiazem (CARDIZEM) tablet 75 mg  75 mg Oral 4 times per day Bhavinkumar Bhagat, PA   75 mg at 06/01/15 1148  . enoxaparin (LOVENOX) injection 40 mg  40 mg Subcutaneous Q24H Rise Patience, MD   40 mg at 06/01/15 1003  . ferrous gluconate (FERGON) tablet 324 mg  324 mg Oral BID WC Rise Patience, MD   324 mg at 06/01/15 1004  .  insulin aspart (novoLOG) injection 0-5 Units  0-5 Units Subcutaneous QHS Modena Jansky, MD   2 Units at 05/31/15 2045  . insulin aspart (novoLOG) injection 0-9 Units  0-9 Units Subcutaneous TID WC Modena Jansky, MD   3 Units at 06/01/15 1149  . [START ON 06/02/2015] insulin glargine (LANTUS) injection 8 Units  8 Units Subcutaneous Daily Geradine Girt, DO      . levalbuterol (XOPENEX) nebulizer solution 0.63 mg  0.63 mg Nebulization Q8H PRN Nani Skillern, PA-C   0.63 mg at 05/30/15 1031  . metoprolol tartrate (LOPRESSOR) tablet 25 mg  25 mg Oral BID Bhavinkumar Bhagat, PA   25 mg at 06/01/15 1035  . multivitamin with minerals tablet 1 tablet  1 tablet Oral Daily Rise Patience, MD   1 tablet at 06/01/15 1003  . nitroGLYCERIN (NITROSTAT) SL tablet 0.4 mg  0.4 mg Sublingual Q5 min PRN Roberto Scales, MD   0.4 mg at 05/29/15 2319  . ondansetron (ZOFRAN) tablet 4 mg  4 mg Oral Q6H PRN Rise Patience, MD       Or  . ondansetron [facility]) injection 4 mg  4 mg Intravenous Q6H PRN Rise Patience, MD      . pantoprazole (PROTONIX) EC tablet 40 mg  40 mg Oral Daily Rise Patience, MD  40 mg at 06/01/15 1004  . sodium chloride 0.9 % injection 3 mL  3 mL Intravenous Q12H Rise Patience, MD   3 mL at 06/01/15 1151  . traMADol (ULTRAM) tablet 50 mg  50 mg Oral Q6H PRN Rise Patience, MD   50 mg at 05/31/15 2029  . vitamin C (ASCORBIC ACID) tablet 250 mg  250 mg Oral Daily Rise Patience, MD   250 mg at 06/01/15 1006    PE: General appearance: alert, cooperative and no distress Neck: no carotid bruit and no JVD Lungs: clear to auscultation bilaterally Heart: regular rate and rhythm Extremities: trace LEE Pulses: 2+ and symmetric Skin: warm and dry Neurologic: Grossly normal  Lab Results:   Recent Labs  05/30/15 0522 05/31/15 0318 06/01/15 0315  WBC 10.4 15.0* 8.6  HGB 9.7* 9.4* 9.5*  HCT 29.8* 28.7* 29.7*  PLT 272 370 374   BMET  Recent Labs   05/30/15 0522 05/31/15 0318 06/01/15 0315  NA 135 138 136  K 4.9 4.0 3.9  CL 99* 101 98*  CO2 24 27 29   GLUCOSE 229* 173* 172*  BUN 24* 30* 28*  CREATININE 1.59* 1.71* 1.66*  CALCIUM 8.8* 9.2 8.6*   PT/INR  Recent Labs  05/29/15 2155  LABPROT 14.9  INR 1.15   Cholesterol  Recent Labs  06/01/15 0315  CHOL 82     Assessment/Plan    Principal Problem:   Chest pain Active Problems:   Essential hypertension   Hyperlipidemia   Type 2 diabetes mellitus with vascular disease ([name])   OSA on CPAP   Pain in the chest   Atrial fibrillation with rapid ventricular response ([name])   PAF (paroxysmal atrial fibrillation) ([place])   1. CAD: s/p CABG. Stable w/o recurrent CP or dyspnea. On ASA, statin and BB.   2. Atrial Fibrillation: NSR on telemetry. HR controlled in the 70s on PO Coreg and Cardizem.    LOS: 2 days    Brittainy M. Ladoris Gene 06/01/2015 1:29 PM   Patient seen and examined. Agree with assessment and plan. Maintaining NSR with PAC's without recurrent AF; Would transition to Cardizem CD 300 mg daily instead of 75q 6 hr tomorrow with dc planned. No wheezing on metoprolol. On CPAP for OSA.   Troy Sine, MD, [facility] 06/01/2015 2:00 PM

## 2015-06-01 NOTE — Progress Notes (Signed)
Nursing note Patient ambulated in hallway with spouse. Back in bed will continue to monitor patient. Kaedynce Tapp, Bettina Gavia RN

## 2015-06-01 NOTE — Progress Notes (Addendum)
      [address]       [place] [address]             [phone]             Subjective: Patient's only complaint is popping/clicking left side of sternum. Overall, he feels better.  Objective: Vital signs in last 24 hours: Temp:  [97.5 F (36.4 C)-98.7 F (37.1 C)] 97.5 F (36.4 C) (10/21 0610) Pulse Rate:  [78-88] 80 (10/21 0610) Cardiac Rhythm:  [-] Normal sinus rhythm (10/21 0700) Resp:  [17-18] 18 (10/20 1949) BP: (131-145)/(54-74) 132/74 mmHg (10/21 0610) SpO2:  [95 %-99 %] 95 % (10/21 0610)  Pre op weight 95 kg Current Weight  05/31/15 212 lb 11.9 oz (96.5 kg)     10/20 0701 - 10/21 0700 In: 600 [P.O.:600] Out: 3325 [Urine:3325]  Physical Exam:  Cardiovascular: RRR Pulmonary: Mostly clear; no rales, wheezes, or rhonchi. Abdomen: Soft, non tender, bowel sounds present. Extremities: Trace bilateral lower extremity edema. Wounds: Clean and dry.  No erythema or signs of infection. There is a popping mid left of sternum. No sternal instability  Lab Results: CBC:  Recent Labs  05/31/15 0318 06/01/15 0315  WBC 15.0* 8.6  HGB 9.4* 9.5*  HCT 28.7* 29.7*  PLT 370 374   BMET:   Recent Labs  05/31/15 0318 06/01/15 0315  NA 138 136  K 4.0 3.9  CL 101 98*  CO2 27 29  GLUCOSE 173* 172*  BUN 30* 28*  CREATININE 1.71* 1.66*  CALCIUM 9.2 8.6*    PT/INR:  Lab Results  Component Value Date   INR 1.15 05/29/2015   INR 1.51* 05/23/2015   INR 1.01 05/21/2015   ABG:  INR: Will add last result for INR, ABG once components are confirmed Will add last 4 CBG results once components are confirmed  Assessment/Plan:  1. CV - Previous A fib with RVR in the 120's. Converted to SR and is in the 70's this am.  On Cardizem 75 mg qid, Lopressor 25 mg bid.  2.  Pulmonary - On room air this am.  Xopenex PRN wheezing. Encourage incentive spirometer and flutter valve. 3.  Acute blood loss anemia - H and H 9.5 and 29.7 this am. On [place]. 4.  Creatinine this am 1.66. His baseline is around 1.5.  5. Regarding popping of left of sternum, likely cartilage disruption. No sternal instability at this time. PRN pain meds and observation. 6.DM-CBGs 172/255/155. On Insulin. HGA1C 8.1. Will need close medical follow up after discharge. 7. Likely home in am  ZIMMERMAN,DONIELLE MPA-C 06/01/2015,7:58 AM   Feels better Poss home in am Holding sinus I have seen and examined Bradley [name] and agree with the above assessment  and plan.  Grace Isaac MD [facility] [phone] Office [phone] 06/01/2015 10:51 AM

## 2015-06-01 NOTE — Progress Notes (Signed)
PROGRESS NOTE    Bradley [name] [id number] DOB: 1946/06/19 DOA: 05/29/2015 PCP:  Melinda Crutch, MD  HPI/Brief narrative 69 year old male with history of CAD, status post CABG 3 on 05/23/15, HLD, HTN, DM 2, PAD, CVA, OSA on nightly CPAP, GERD, carotid artery occlusion, anemia, discharged home by thoracic surgeons on 05/29/15, return to ED same night with complaints of transient left-sided chest pain and felt like something popped out of his chest, right-sided chest pain, diaphoresis and dyspnea. In the ED, symptoms improved after sublingual NTG and nebulizations. Thoracic surgery was consulted by EDP. CTA chest negative for PE. Admitted for further evaluation and management. Went into A. fib with RVR in the 120s. Cardiology consulted. TCTS took over primary care. [facility] consulting for medical Mx of DM, HTN, AoCKD etc.   Assessment/Plan:  Atypical chest pain - Status post CABG on 05/23/15. - Mildly elevated troponin, 0.04 > 0.10-probably not significant. CTA chest: No pulmonary embolism - Chest pain resolved. - Management per cardiology/cardiothoracic surgery - May be related to recent surgical trauma/cartilage disruption.  Paroxysmal A. fib with RVR - Asymptomatic currently - Thoracic surgery started patient on Cardizem drip, beta blockers which was switched from carvedilol to metoprolol due to wheezing on admission. Cardiology input appreciated.  - Patient reverted to sinus rhythm. Cardizem being switched to oral. - anticoagulation decision defer to TCTS/cardiology Reverted to sinus rhythm at 5:50 PM on 10/19.   Hypokalemia - Replaced  Acute blood loss anemia - Stable over last 24 hours.   Acute on Stage III chronic kidney disease - Baseline creatinine probably in the 1.4 range. Creatinine has increased to 1.7-likely secondary to diuresis. Seems to be euvolemic clinically and chest x-ray without pulmonary edema. Follow BMP in a.m.   Uncontrolled type II DM with renal  complications - DC metformin. Hold Amaryl. Continue NovoLog SSI. Started low-dose Lantus 10/19 -Would d/c back on home regimen if Cr continues to improve asked secretary to schedule follow up with Dr. Buddy Duty 1-2 weeks after d/c  OSA - Continue nightly CPAP.  Essential hypertension - Controlled  HLD - Statins  CAD status post CABG - Management per cardiology/cardiothoracic surgery  Leukocytosis - Possibly stress response. No clinical features suggestive of infection. Monitor   DVT prophylaxis: Subcutaneous Lovenox Code Status: Full Family Communication: None at bedside  Disposition Plan: D/C home when medically stable and defer to TCTS   Consultants:  Cardiothoracic surgery  Cardiology  Procedures:  None  Antibiotics:  None   Subjective: Denies complaints  Objective: Filed Vitals:   05/31/15 1949 06/01/15 0018 06/01/15 0610 06/01/15 1035  BP: 139/54 139/73 132/74 133/74  Pulse: 88 78 80 79  Temp: 97.8 F (36.6 C)  97.5 F (36.4 C)   TempSrc: Oral  Oral   Resp: 18     Height:      Weight:      SpO2: 96%  95%     Intake/Output Summary (Last 24 hours) at 06/01/15 1155 Last data filed at 06/01/15 0900  Gross per 24 hour  Intake    720 ml  Output   3225 ml  Net  -2505 ml   Filed Weights   05/29/15 2122 05/30/15 0300 05/31/15 0515  Weight: 98.884 kg (218 lb) 99.701 kg (219 lb 12.8 oz) 96.5 kg (212 lb 11.9 oz)     Exam:  General exam: NAD Respiratory system: clear to auscultation. No increased work of breathing. Cardiovascular system: S1 & S2 heard, irregularly irregular and tachycardic. No JVD, murmurs, gallops,  clicks or pedal edema.   Gastrointestinal system: Abdomen is nondistended, soft and nontender. Normal bowel sounds heard. Extremities: Symmetric 5 x 5 power.   Data Reviewed: Basic Metabolic Panel:  Recent Labs Lab 05/29/15 0226 05/29/15 2155 05/30/15 0522 05/31/15 0318 06/01/15 0315  NA 135 130* 135 138 136  K 3.4* 4.7 4.9 4.0  3.9  CL 99* 97* 99* 101 98*  CO2 25 21* 24 27 29   GLUCOSE 114* 153* 229* 173* 172*  BUN 20 23* 24* 30* 28*  CREATININE 1.64* 1.53* 1.59* 1.71* 1.66*  CALCIUM 8.7* 8.4* 8.8* 9.2 8.6*   Liver Function Tests:  Recent Labs Lab 05/30/15 0522  AST 14*  ALT 18  ALKPHOS 56  BILITOT 0.8  PROT 5.8*  ALBUMIN 2.2*   No results for input(s): LIPASE, AMYLASE in the last 168 hours. No results for input(s): AMMONIA in the last 168 hours. CBC:  Recent Labs Lab 05/26/15 0415 05/29/15 2155 05/30/15 0522 05/31/15 0318 06/01/15 0315  WBC 15.1* 16.3* 10.4 15.0* 8.6  NEUTROABS  --   --  9.9*  --   --   HGB 10.7* 10.3* 9.7* 9.4* 9.5*  HCT 31.4* 30.4* 29.8* 28.7* 29.7*  MCV 95.4 95.3 95.8 96.6 97.7  PLT 191 294 272 370 374   Cardiac Enzymes:  Recent Labs Lab 05/30/15 0522 05/30/15 1010 05/30/15 1642  TROPONINI 0.04* 0.10* 0.03   BNP (last 3 results)  Recent Labs  04/30/15 0957  PROBNP 6.0   CBG:  Recent Labs Lab 05/31/15 1137 05/31/15 1618 05/31/15 2032 06/01/15 0620 06/01/15 1122  GLUCAP 211* 172* 255* 155* 213*    No results found for this or any previous visit (from the past 240 hour(s)).         Studies: Dg Chest 2 View  05/31/2015  CLINICAL DATA:  Pleural effusion, postop EXAM: CHEST  2 VIEW COMPARISON:  05/30/2015 FINDINGS: Cardiomediastinal silhouette is stable. Status post CABG. Small bilateral pleural effusion. Mild degenerative changes thoracic spine. No infiltrate or pulmonary edema. IMPRESSION: Status post CABG. Small bilateral pleural effusion. No infiltrate or pulmonary edema. Electronically Signed   By: Lahoma Crocker M.D.   On: 05/31/2015 08:24        Scheduled Meds: . aspirin EC  81 mg Oral Daily  . atorvastatin  40 mg Oral q1800  . calcium-vitamin D  1 tablet Oral Daily  . cholecalciferol  2,000 Units Oral Daily  . diltiazem  75 mg Oral 4 times per day  . enoxaparin (LOVENOX) injection  40 mg Subcutaneous Q24H  . ferrous gluconate  324  mg Oral BID WC  . insulin aspart  0-5 Units Subcutaneous QHS  . insulin aspart  0-9 Units Subcutaneous TID WC  . [START ON 06/02/2015] insulin glargine  8 Units Subcutaneous Daily  . metoprolol tartrate  25 mg Oral BID  . multivitamin with minerals  1 tablet Oral Daily  . pantoprazole  40 mg Oral Daily  . sodium chloride  3 mL Intravenous Q12H  . vitamin C  250 mg Oral Daily   Continuous Infusions:    Principal Problem:   Chest pain Active Problems:   Essential hypertension   Hyperlipidemia   Type 2 diabetes mellitus with vascular disease ([name])   OSA on CPAP   Pain in the chest   Atrial fibrillation with rapid ventricular response ([name])   PAF (paroxysmal atrial fibrillation) ([place])    Time spent: 15 minutes.    Presten Joost, DO. [facility] Pager [phone]  If 7PM-7AM, please contact night-coverage [email or web address] Password [email or web address] 06/01/2015, 11:55 AM    LOS: 2 days

## 2015-06-01 NOTE — Progress Notes (Signed)
Pt is wearing home CPAP. Respiratory not needed at this time.

## 2015-06-01 NOTE — Progress Notes (Signed)
Pt ambulated in hallway with family, back in room will monitor patient. Lovelle Deitrick, Bettina Gavia RN

## 2015-06-01 NOTE — Consult Note (Signed)
   [facility] Inpatient Consult   06/01/2015  Bradley [name] Aug 27, 1945 [id number] Patient evaluated for community based chronic disease management services with Bradley [name] as a benefit of patient's Health [employer] [facility] plan. Spoke with patient and wife Bradley [name] [phone] - cell phone confirmed] at bedside to explain [place] Management services. Consent form signed.   Patient will receive post discharge transition of care call and will be evaluated for monthly home visits for assessments and disease process education.  Left contact information and [facility] literature folder with wife. Made Inpatient Case Manager aware that [place] Management following. Of note, [facility] Care Management services does not replace or interfere with any services that are arranged by inpatient case management or social work.  For additional questions or referrals please contact:   Bradley Brood, RN BSN [facility] Liaison  [phone] business mobile phone

## 2015-06-02 DIAGNOSIS — E1159 Type 2 diabetes mellitus with other circulatory complications: Secondary | ICD-10-CM

## 2015-06-02 LAB — GLUCOSE, CAPILLARY
Glucose-Capillary: 158 mg/dL — ABNORMAL HIGH (ref 65–99)
Glucose-Capillary: 210 mg/dL — ABNORMAL HIGH (ref 65–99)
Glucose-Capillary: 195 mg/dL — ABNORMAL HIGH (ref 65–99)
Glucose-Capillary: 217 mg/dL — ABNORMAL HIGH (ref 65–99)

## 2015-06-02 MED ORDER — ALBUTEROL SULFATE (2.5 MG/3ML) 0.083% IN NEBU: 3.0000 mL | INHALATION_SOLUTION | Freq: Four times a day (QID) | RESPIRATORY_TRACT | Status: DC | PRN

## 2015-06-02 NOTE — Progress Notes (Addendum)
Subjective:  No chest pain;   Objective:   Vital Signs : Filed Vitals:   06/01/15 1035 06/01/15 1350 06/01/15 2053 06/02/15 0321  BP: 133/74 126/60 121/70 150/57  Pulse: 79 70 84 81  Temp:  98 F (36.7 C) 98 F (36.7 C) 97.6 F (36.4 C)  TempSrc:  Oral Oral Oral  Resp:  _0 Height:      Weight:    208 lb 5.4 oz (94.5 kg)  SpO2:  97% 97% 98%    Intake/Output from previous day:  Intake/Output Summary (Last 24 hours) at 06/02/15 1025 Last data filed at 06/02/15 0700  Gross per 24 hour  Intake    240 ml  Output   2261 ml  Net  -2021 ml    I/O since admission: -7447  Wt Readings from Last 3 Encounters:  06/02/15 208 lb 5.4 oz (94.5 kg)  05/29/15 217 lb 13 oz (98.8 kg)  05/21/15 210 lb 14.4 oz (95.664 kg)    Medications: . aspirin EC  81 mg Oral Daily  . atorvastatin  40 mg Oral q1800  . calcium-vitamin D  1 tablet Oral Daily  . cholecalciferol  2,000 Units Oral Daily  . diltiazem  75 mg Oral 4 times per day  . enoxaparin (LOVENOX) injection  40 mg Subcutaneous Q24H  . ferrous gluconate  324 mg Oral BID WC  . insulin aspart  0-5 Units Subcutaneous QHS  . insulin aspart  0-9 Units Subcutaneous TID WC  . insulin glargine  8 Units Subcutaneous Daily  . metoprolol tartrate  25 mg Oral BID  . multivitamin with minerals  1 tablet Oral Daily  . pantoprazole  40 mg Oral Daily  . sodium chloride  3 mL Intravenous Q12H  . vitamin C  250 mg Oral Daily       Physical Exam:   General appearance: alert, cooperative and no distress Neck: no adenopathy, no JVD, supple, symmetrical, trachea midline and thyroid not enlarged, symmetric, no tenderness/mass/nodules Lungs: no wheezing Heart: regular rate and rhythm and 1/6 sem  Abdomen: soft, non-tender; bowel sounds normal; no masses,  no organomegaly Extremities: trivial ankle edema Pulses: 2+ and symmetric Neurologic: Grossly normal   Rate: 88  Rhythm: normal sinus rhythm with PAC's   Lab  Results:   Recent Labs  05/31/15 0318 06/01/15 0315  NA 138 136  K 4.0 3.9  CL 101 98*  CO2 27 29  GLUCOSE 173* 172*  BUN 30* 28*  CREATININE 1.71* 1.66*  CALCIUM 9.2 8.6*    Hepatic Function Latest Ref Rng 05/30/2015 05/21/2015 12/12/2013  Total Protein 6.5 - 8.1 g/dL 5.8(L) 6.8 7.0  Albumin 3.5 - 5.0 g/dL 2.2(L) 3.7 3.4(L)  AST 15 - 41 U/L 14(L) 16 19  ALT 17 - 63 U/L _1 Alk Phosphatase 38 - 126 U/L 56 55 60  Total Bilirubin 0.3 - 1.2 mg/dL 0.8 0.9 0.3  Bilirubin, Direct 0.0 - 0.3 mg/dL - - <0.2     Recent Labs  05/31/15 0318 06/01/15 0315  WBC 15.0* 8.6  HGB 9.4* 9.5*  HCT 28.7* 29.7*  MCV 96.6 97.7  PLT 370 374     Recent Labs  05/30/15 1642  TROPONINI 0.03    Lab Results  Component Value Date   TSH 2.630 05/26/2015   No results for input(s): HGBA1C in the last 72 hours.  No results for input(s): PROT, ALBUMIN, AST, ALT, ALKPHOS, BILITOT, BILIDIR, IBILI in the last 72  hours. No results for input(s): INR in the last 72 hours. BNP (last 3 results) No results for input(s): BNP in the last 8760 hours.  ProBNP (last 3 results)  Recent Labs  04/30/15 0957  PROBNP 6.0     Lipid Panel     Component Value Date/Time   CHOL 82 06/01/2015 0315   TRIG 158* 06/01/2015 0315   HDL 24* 06/01/2015 0315   CHOLHDL 3.4 06/01/2015 0315   VLDL 32 06/01/2015 0315   LDLCALC 26 06/01/2015 0315    Imaging:  No results found.    Assessment/Plan:   Principal Problem:   Chest pain Active Problems:   Essential hypertension   Hyperlipidemia   Type 2 diabetes mellitus with vascular disease ([name])   OSA on CPAP   Pain in the chest   Atrial fibrillation with rapid ventricular response ([name])   PAF (paroxysmal atrial fibrillation) ([name])   CAD in native artery  No recurrent AF but will titrate lopressor to 50 mg bid for improved rate and ectopy suppression. Transition to daily cardizem. Dr. Servando Snare to re-assess sternum. Cr slightly improved today to  1.66. On statin with CAD s/p CABG with very low LDL but TG mildly elevated. Continue CPAP for OSA.  F/U Dr. Linard Millers post dc.    Bradley Sine, MD, [facility] 06/02/2015, 10:25 AM

## 2015-06-02 NOTE — Progress Notes (Addendum)
-  Would d/c on home regimen not insulin--- spoke with diabetic coordinator -patient had not been following a strict diabetic diet at home -asked secretary Friday to schedule follow up with Dr. Buddy Duty 1-2 weeks after d/c as patient had appointment during hospitalization that he missed.  Dr. Buddy Duty has been adjusting meds as outpatient Will sign off- available for questions [place] DO

## 2015-06-02 NOTE — Progress Notes (Addendum)
[name]       [name]             [phone]          Subjective: Having periods of PAC's, some sternal drainage lower pole of incis  Objective: Vital signs in last 24 hours: Temp:  [97.6 F (36.4 C)-98 F (36.7 C)] 97.6 F (36.4 C) (10/22 0321) Pulse Rate:  [70-84] 81 (10/22 0321) Cardiac Rhythm:  [-] Normal sinus rhythm;Bundle branch block (10/22 0700) Resp:  [18-19] 18 (10/22 0321) BP: (121-150)/(57-74) 150/57 mmHg (10/22 0321) SpO2:  [97 %-98 %] 98 % (10/22 0321) Weight:  [208 lb 5.4 oz (94.5 kg)] 208 lb 5.4 oz (94.5 kg) (10/22 0321)  Hemodynamic parameters for last 24 hours:    Intake/Output from previous day: 10/21 0701 - 10/22 0700 In: 600 [P.O.:600] Out: 2762 [Urine:2760; Stool:2] Intake/Output this shift:    General appearance: alert, cooperative and no distress Heart: regular rate and rhythm Lungs: clear to auscultation bilaterally Abdomen: benign Extremities: no edema Wound: sternal incis with some serosang drainage lower pole, no erethema, + sternal mobility/click  Lab Results:  Recent Labs  05/31/15 0318 06/01/15 0315  WBC 15.0* 8.6  HGB 9.4* 9.5*  HCT 28.7* 29.7*  PLT 370 374   BMET:  Recent Labs  05/31/15 0318 06/01/15 0315  NA 138 136  K 4.0 3.9  CL 101 98*  CO2 27 29  GLUCOSE 173* 172*  BUN 30* 28*  CREATININE 1.71* 1.66*  CALCIUM 9.2 8.6*    PT/INR: No results for input(s): LABPROT, INR in the last 72 hours. ABG    Component Value Date/Time   PHART 7.297* 05/24/2015 0325   HCO3 21.2 05/24/2015 0325   TCO2 19 05/24/2015 1649   ACIDBASEDEF 5.0* 05/24/2015 0325   O2SAT 91.0 05/24/2015 0325   CBG (last 3)   Recent Labs  06/01/15 1607 06/01/15 2045 06/02/15 0609  GLUCAP 190* 253* 158*    Meds Scheduled Meds: . aspirin EC  81 mg Oral Daily  . atorvastatin  40 mg Oral q1800  . calcium-vitamin D  1 tablet Oral Daily  . cholecalciferol  2,000 Units Oral Daily  . diltiazem  75  mg Oral 4 times per day  . enoxaparin (LOVENOX) injection  40 mg Subcutaneous Q24H  . ferrous gluconate  324 mg Oral BID WC  . insulin aspart  0-5 Units Subcutaneous QHS  . insulin aspart  0-9 Units Subcutaneous TID WC  . insulin glargine  8 Units Subcutaneous Daily  . metoprolol tartrate  25 mg Oral BID  . multivitamin with minerals  1 tablet Oral Daily  . pantoprazole  40 mg Oral Daily  . sodium chloride  3 mL Intravenous Q12H  . vitamin C  250 mg Oral Daily   Continuous Infusions:  PRN Meds:.acetaminophen **OR** acetaminophen, levalbuterol, nitroGLYCERIN, ondansetron **OR** ondansetron (ZOFRAN) IV, traMADol  Xrays No results found.  Assessment/Plan:   1 some episodes of sinus tach with pac's in low 100's on cardizem and lopressor 2 sternal mobility, some drainage, may need further evaluation 3 not clear to me diabetes plan at discharge 4 add albuterol inhaler prn     LOS: 3 days    Bradley [name],Bradley [name] [id number]  Sternum has click at lower incision , upper 5-6 wires are all intact. Lower wire pulled through the bone incision intact. Observe. cardiology adjusted med today plan d/c in am I have seen and examined Bradley [name]  and agree with the above assessment  and plan.  Bradley Isaac MD Beeper [phone] Office [phone] 06/02/2015 1:18 PM

## 2015-06-02 NOTE — Progress Notes (Signed)
Came to assess pt and see if need help with his CPAP mask. Pt was already on mask relaxing in bedside chair on my arrival. Pt is stable at this time.  No complications or distress noted.

## 2015-06-03 LAB — GLUCOSE, CAPILLARY
Glucose-Capillary: 181 mg/dL — ABNORMAL HIGH (ref 65–99)
Glucose-Capillary: 217 mg/dL — ABNORMAL HIGH (ref 65–99)
Glucose-Capillary: 169 mg/dL — ABNORMAL HIGH (ref 65–99)
Glucose-Capillary: 158 mg/dL — ABNORMAL HIGH (ref 65–99)

## 2015-06-03 MED ORDER — METOPROLOL TARTRATE 25 MG PO TABS
25.0000 mg | ORAL_TABLET | Freq: Once | ORAL | Status: AC
  Administered 2015-06-03: 25 mg via ORAL
  Filled 2015-06-03: qty 1

## 2015-06-03 MED ORDER — METOPROLOL TARTRATE 50 MG PO TABS
50.0000 mg | ORAL_TABLET | Freq: Two times a day (BID) | ORAL | Status: DC
  Administered 2015-06-03 – 2015-06-04 (×2): 50 mg via ORAL
  Filled 2015-06-03 (×2): qty 1

## 2015-06-03 NOTE — Progress Notes (Signed)
Subjective:  No chest pain; Feels well; apparently had transient recurrent AF earlier. Pt unaware  Objective:   Vital Signs : Filed Vitals:   06/02/15 1500 06/02/15 1959 06/03/15 0545 06/03/15 0546  BP: 144/78 133/61 137/65   Pulse:  94 84   Temp:  97.9 F (36.6 C) 97.8 F (36.6 C)   TempSrc:  Oral Oral   Resp:  16 18   Height:      Weight:    205 lb 3.2 oz (93.078 kg)  SpO2:  97% 98%     Intake/Output from previous day:  Intake/Output Summary (Last 24 hours) at 06/03/15 1103 Last data filed at 06/03/15 0833  Gross per 24 hour  Intake   1035 ml  Output   2350 ml  Net  -1315 ml    I/O since admission: -8762  Wt Readings from Last 3 Encounters:  06/03/15 205 lb 3.2 oz (93.078 kg)  05/29/15 217 lb 13 oz (98.8 kg)  05/21/15 210 lb 14.4 oz (95.664 kg)    Medications: . aspirin EC  81 mg Oral Daily  . atorvastatin  40 mg Oral q1800  . calcium-vitamin D  1 tablet Oral Daily  . cholecalciferol  2,000 Units Oral Daily  . diltiazem  75 mg Oral 4 times per day  . enoxaparin (LOVENOX) injection  40 mg Subcutaneous Q24H  . ferrous gluconate  324 mg Oral BID WC  . insulin aspart  0-5 Units Subcutaneous QHS  . insulin aspart  0-9 Units Subcutaneous TID WC  . insulin glargine  8 Units Subcutaneous Daily  . metoprolol tartrate  25 mg Oral BID  . multivitamin with minerals  1 tablet Oral Daily  . pantoprazole  40 mg Oral Daily  . sodium chloride  3 mL Intravenous Q12H  . vitamin C  250 mg Oral Daily       Physical Exam:   General appearance: alert, cooperative and no distress Neck: no adenopathy, no JVD, supple, symmetrical, trachea midline and thyroid not enlarged, symmetric, no tenderness/mass/nodules Lungs: no wheezing Heart: regular rate and rhythm and 1/6 sem  Abdomen: soft, non-tender; bowel sounds normal; no masses,  no organomegaly Extremities: trivial ankle edema Pulses: 2+ and symmetric Neurologic: Grossly normal   Rate: 78-84  Rhythm: normal sinus  rhythm with PAC's;  Earlier reportedly went back into transient AF   Lab Results:   Recent Labs  06/01/15 0315  NA 136  K 3.9  CL 98*  CO2 29  GLUCOSE 172*  BUN 28*  CREATININE 1.66*  CALCIUM 8.6*    Hepatic Function Latest Ref Rng 05/30/2015 05/21/2015 12/12/2013  Total Protein 6.5 - 8.1 g/dL 5.8(L) 6.8 7.0  Albumin 3.5 - 5.0 g/dL 2.2(L) 3.7 3.4(L)  AST 15 - 41 U/L 14(L) 16 19  ALT 17 - 63 U/L $Remo'18 17 23  'cJqTj$ Alk Phosphatase 38 - 126 U/L 56 55 60  Total Bilirubin 0.3 - 1.2 mg/dL 0.8 0.9 0.3  Bilirubin, Direct 0.0 - 0.3 mg/dL - - <0.2     Recent Labs  06/01/15 0315  WBC 8.6  HGB 9.5*  HCT 29.7*  MCV 97.7  PLT 374    No results for input(s): TROPONINI in the last 72 hours.  Invalid input(s): CK, MB  Lab Results  Component Value Date   TSH 2.630 05/26/2015   No results for input(s): HGBA1C in the last 72 hours.  No results for input(s): PROT, ALBUMIN, AST, ALT, ALKPHOS, BILITOT, BILIDIR, IBILI in the last 72  hours. No results for input(s): INR in the last 72 hours. BNP (last 3 results) No results for input(s): BNP in the last 8760 hours.  ProBNP (last 3 results)  Recent Labs  04/30/15 0957  PROBNP 6.0     Lipid Panel     Component Value Date/Time   CHOL 82 06/01/2015 0315   TRIG 158* 06/01/2015 0315   HDL 24* 06/01/2015 0315   CHOLHDL 3.4 06/01/2015 0315   VLDL 32 06/01/2015 0315   LDLCALC 26 06/01/2015 0315    Imaging:  No results found.    Assessment/Plan:   Principal Problem:   Chest pain Active Problems:     Essential hypertension   Hyperlipidemia   Type 2 diabetes mellitus with vascular disease ([name])   OSA on CPAP   Pain in the chest   Atrial fibrillation with rapid ventricular response ([name])   PAF (paroxysmal atrial fibrillation) ([name])   CAD in native artery  Had recurrent PAF; now back in sinus in the 70's-80s. Increase lopressor to 50 mg bid, will give extra 25 mg now if only received 25 mg this am.  Obserce ? DC later today  on increased dose and 300 mg daily cardizem,. If recurrent AF, will need anticoagulation if ok with surgery and possibly short coarse of amiodarone. If DC, should have cardiology f/u later this week in office vto re-asses.  Troy Sine, MD, [facility] 06/03/2015, 11:03 AM

## 2015-06-03 NOTE — Progress Notes (Addendum)
      [address]       [place] [address]             [phone]          Subjective: In atrial fib with controlled rate  Objective: Vital signs in last 24 hours: Temp:  [97.8 F (36.6 C)-97.9 F (36.6 C)] 97.8 F (36.6 C) (10/23 0545) Pulse Rate:  [84-94] 84 (10/23 0545) Cardiac Rhythm:  [-] Normal sinus rhythm (10/23 0908) Resp:  [16-18] 18 (10/23 0545) BP: (133-163)/(61-86) 137/65 mmHg (10/23 0545) SpO2:  [97 %-100 %] 98 % (10/23 0545) Weight:  [205 lb 3.2 oz (93.078 kg)] 205 lb 3.2 oz (93.078 kg) (10/23 0546)  Hemodynamic parameters for last 24 hours:    Intake/Output from previous day: 10/22 0701 - 10/23 0700 In: 795 [P.O.:795] Out: 2050 [Urine:2050] Intake/Output this shift: Total I/O In: 240 [P.O.:240] Out: 300 [Urine:300]  General appearance: alert, cooperative and no distress Heart: irregularly irregular rhythm Lungs: dim in bases Abdomen: benign Extremities: no edema Wound: sternum with mobility /click lower pole  Lab Results:  Recent Labs  06/01/15 0315  WBC 8.6  HGB 9.5*  HCT 29.7*  PLT 374   BMET:  Recent Labs  06/01/15 0315  NA 136  K 3.9  CL 98*  CO2 29  GLUCOSE 172*  BUN 28*  CREATININE 1.66*  CALCIUM 8.6*    PT/INR: No results for input(s): LABPROT, INR in the last 72 hours. ABG    Component Value Date/Time   PHART 7.297* 05/24/2015 0325   HCO3 21.2 05/24/2015 0325   TCO2 19 05/24/2015 1649   ACIDBASEDEF 5.0* 05/24/2015 0325   O2SAT 91.0 05/24/2015 0325   CBG (last 3)   Recent Labs  06/02/15 1640 06/02/15 2128 06/03/15 0605  GLUCAP 195* 217* 181*    Meds Scheduled Meds: . aspirin EC  81 mg Oral Daily  . atorvastatin  40 mg Oral q1800  . calcium-vitamin D  1 tablet Oral Daily  . cholecalciferol  2,000 Units Oral Daily  . diltiazem  75 mg Oral 4 times per day  . enoxaparin (LOVENOX) injection  40 mg Subcutaneous Q24H  . ferrous gluconate  324 mg Oral BID WC  . insulin aspart  0-5 Units  Subcutaneous QHS  . insulin aspart  0-9 Units Subcutaneous TID WC  . insulin glargine  8 Units Subcutaneous Daily  . metoprolol tartrate  25 mg Oral BID  . multivitamin with minerals  1 tablet Oral Daily  . pantoprazole  40 mg Oral Daily  . sodium chloride  3 mL Intravenous Q12H  . vitamin C  250 mg Oral Daily   Continuous Infusions:  PRN Meds:.acetaminophen **OR** acetaminophen, albuterol, levalbuterol, nitroGLYCERIN, ondansetron **OR** ondansetron (ZOFRAN) IV, traMADol  Xrays No results found.  Assessment/Plan:  1 back in afib with controlled rate, intermittent- will see what cardiology opinion is as to medication adjustment 2 sternal instability appears grossly about the same   LOS: 4 days    Bradley [name],Bradley [name] 06/03/2015  sternum ok today , limited pop at lower end only remainer is stable, no sign of infection Brief afib this am, sinus rest of day, will d/c home if not afib for 24 hours I have seen and examined Bradley [name] and agree with the above assessment  and plan.  Grace Isaac MD Beeper [phone] Office [phone] 06/03/2015 1:00 PM

## 2015-06-04 LAB — GLUCOSE, CAPILLARY: Glucose-Capillary: 186 mg/dL — ABNORMAL HIGH (ref 65–99)

## 2015-06-04 MED ORDER — METOPROLOL TARTRATE 50 MG PO TABS: 50.0000 mg | ORAL_TABLET | Freq: Two times a day (BID) | ORAL | Status: DC

## 2015-06-04 MED ORDER — DILTIAZEM HCL ER COATED BEADS 300 MG PO CP24: 300.0000 mg | ORAL_CAPSULE | Freq: Every day | ORAL | Status: DC

## 2015-06-04 MED ORDER — ASPIRIN 81 MG PO TBEC: 81.0000 mg | DELAYED_RELEASE_TABLET | Freq: Every day | ORAL | Status: DC

## 2015-06-04 MED ORDER — TRAMADOL HCL 50 MG PO TABS: 50.0000 mg | ORAL_TABLET | Freq: Four times a day (QID) | ORAL | Status: DC | PRN

## 2015-06-04 NOTE — Progress Notes (Signed)
      [address]       ,[place] [address]             [phone]          Subjective: Feels well  Objective: Vital signs in last 24 hours: Temp:  [97.4 F (36.3 C)-97.8 F (36.6 C)] 97.4 F (36.3 C) (10/24 0528) Pulse Rate:  [78-85] 78 (10/24 0528) Cardiac Rhythm:  [-] Normal sinus rhythm (10/24 0709) Resp:  [16-18] 16 (10/24 0528) BP: (117-122)/(53-64) 117/59 mmHg (10/24 0528) SpO2:  [96 %-100 %] 100 % (10/24 0528) Weight:  [202 lb 11.2 oz (91.944 kg)] 202 lb 11.2 oz (91.944 kg) (10/24 0528)  Hemodynamic parameters for last 24 hours:    Intake/Output from previous day: 10/23 0701 - 10/24 0700 In: 840 [P.O.:840] Out: 2625 [Urine:2625] Intake/Output this shift:    General appearance: alert, cooperative and no distress Heart: regular rate and rhythm Lungs: clear to auscultation bilaterally Abdomen: benign Extremities: no edema Wound: incis stable - no drainage, sternum unchanged  Lab Results: No results for input(s): WBC, HGB, HCT, PLT in the last 72 hours. BMET: No results for input(s): NA, K, CL, CO2, GLUCOSE, BUN, CREATININE, CALCIUM in the last 72 hours.  PT/INR: No results for input(s): LABPROT, INR in the last 72 hours. ABG    Component Value Date/Time   PHART 7.297* 05/24/2015 0325   HCO3 21.2 05/24/2015 0325   TCO2 19 05/24/2015 1649   ACIDBASEDEF 5.0* 05/24/2015 0325   O2SAT 91.0 05/24/2015 0325   CBG (last 3)   Recent Labs  06/03/15 1136 06/03/15 1631 06/03/15 2108  GLUCAP 217* 169* 158*    Meds Scheduled Meds: . aspirin EC  81 mg Oral Daily  . atorvastatin  40 mg Oral q1800  . calcium-vitamin D  1 tablet Oral Daily  . cholecalciferol  2,000 Units Oral Daily  . diltiazem  75 mg Oral 4 times per day  . enoxaparin (LOVENOX) injection  40 mg Subcutaneous Q24H  . ferrous gluconate  324 mg Oral BID WC  . insulin aspart  0-5 Units Subcutaneous QHS  . insulin aspart  0-9 Units Subcutaneous TID WC  . insulin glargine  8  Units Subcutaneous Daily  . metoprolol tartrate  50 mg Oral BID  . multivitamin with minerals  1 tablet Oral Daily  . pantoprazole  40 mg Oral Daily  . sodium chloride  3 mL Intravenous Q12H  . vitamin C  250 mg Oral Daily   Continuous Infusions:  PRN Meds:.acetaminophen **OR** acetaminophen, albuterol, levalbuterol, nitroGLYCERIN, ondansetron **OR** ondansetron (ZOFRAN) IV, traMADol  Xrays No results found.  Assessment/Plan: Plan for discharge: see discharge orders   LOS: 5 days    Bradley [name],Bradley [name] 06/04/2015

## 2015-06-04 NOTE — Progress Notes (Signed)
Utilization review completed.  

## 2015-06-04 NOTE — Care Management Note (Signed)
Case Management Note Marvetta Gibbons RN, BSN Unit 2W-Case Manager [phone]  Patient Details  Name: Bradley [name] MRN: [id number] Date of Birth: April 08, 1946  Subjective/Objective:     Pt admitted with chest pain/afib recent discharge s/p CABG               Action/Plan: PTA Pt lived at home with wife- anticipate return home with wife  Expected Discharge Date:      06/04/15            Expected Discharge Plan:  Home/Self Care  In-House Referral:     Discharge planning Services  CM Consult  Post Acute Care Choice:    Choice offered to:     DME Arranged:    DME Agency:     HH Arranged:    [place] Agency:     Status of Service:  Completed, signed off  Medicare Important Message Given:  Yes-second notification given Date Medicare IM Given:    Medicare IM give by:    Date Additional Medicare IM Given:    Additional Medicare Important Message give by:     If discussed at [place] of Stay Meetings, dates discussed:    Additional Comments:  Dawayne Patricia, RN 06/04/2015, 10:08 AM

## 2015-06-04 NOTE — Progress Notes (Signed)
Nursing note Pt given discharge instructions, medication list and paper prescriptions, along with follow up appointments. Pt verbalized understanding will discharge home as ordered. Rafael Salway, Bettina Gavia RN

## 2015-06-04 NOTE — Patient Outreach (Signed)
[facility]) Care Management  06/04/2015  Bradley [name] 1945-11-24 [id number]   Referral from Natividad Brood, RN to assign Community RN, assigned Joylene Draft, RN.  Thanks, Ronnell [place]. [place], [place] Assistant Phone: [phone] Fax: [phone]

## 2015-06-05 ENCOUNTER — Other Ambulatory Visit: Payer: Self-pay | Admitting: *Deleted

## 2015-06-05 NOTE — Patient Outreach (Signed)
[place] [place] [facility]) Care Management  06/05/2015  FERDINAND ALLIE 18-Feb-1946 [id number]   Initial transition of care call. Placed telephone call to Mr.Lafoy no answer,left a message. I will return call later today or on 10/26.  Joylene Draft, RN, [place] Management [phone]- Mobile [phone]- Toll Free Main Office

## 2015-06-06 ENCOUNTER — Other Ambulatory Visit: Payer: Self-pay | Admitting: *Deleted

## 2015-06-06 ENCOUNTER — Encounter: Payer: Self-pay | Admitting: *Deleted

## 2015-06-06 NOTE — Patient Outreach (Signed)
[facility]) Care Management  06/06/2015  CHAYSEN ELZINGA 06/30/1946 [id number]  Transition of care initial call.  Telephone call to Mr.Fetting, able to speak with his wife Vella Redhead states that patient is resting a present. Mr.Oehler was discharged from [facility] on 10/24, his wife discussed that patient was recently discharged from hospital on 10/18 after CABG and returned to hospital 3 hours later due to pain and short of breath, and atrial fib Mrs.Shepherd reports that patient has been resting well, without complaint of shortness of breath, reports pain controlled. Mr.Vanetten's weight on yesterday, was 200 he was still resting in bed this am. His blood sugars has ranged 90 to 159.Patient's wife reported that patient has been taking medication as discharge instructions, without problems , she is unable to review list with me on today, and requested to review on next week. Mrs.Shea reports that patient has tolerated taking a shower with assistance and denies any increase in redness or drainage at incisions.  Mr. Collard has follow up appointments with Cardiology on November,Dr.Ross is primary MD on Nov 1 and Endocrinologist Dr. Buddy Duty on Nov.7. Mrs.Bechard will be able to provide transportation. Discussed the importance follow up with MD after discharge.  Mrs.Mitcham denies any further questions or concerns, I have provided by contact information and encouraged to call for concerns.  Plan: Explain the transition of care program and that patient will receive weekly transition of care calls for 4 weeks and evaluate for a home visit. I will send a letter of involvement to Dr.Ross and this note. Encouraged follow up with MD's  [facility] CM Care Plan Problem One        Most Recent Value   Care Plan Problem One  Patient with recent admission related to heart surgery, at risk for readmission   Role Documenting the Problem One  Care Management [place] for Problem One   Active   THN Long Term Goal (31-90 days)  Patient will verbalize no readmission in next 31 days   THN Long Term Goal Start Date  06/06/15   Interventions for Problem One Long Term Goal  Discussed with wife the importane of taking all medication as prescribed, discussed importance of timely follow up with providers,,provided my contact infomation,,discussed to call MD for concerns,with weight gain, hypo or hyperglycemia pain or shortness of breath   THN CM Short Term Goal #1 (0-30 days)  Patient and wife will verbalize follow up with primary care provider within 7 days    THN CM Short Term Goal #1 Start Date  06/06/15   Interventions for Short Term Goal #1  Explained reasoning of importance of early follow up , wife able to list follow up appointment dates   [facility] CM Short Term Goal #2 (0-30 days)  Patient will weigh and record daily for the next 30 days   THN CM Short Term Goal #2 Start Date  06/06/15   Interventions for Short Term Goal #2  Reviewed importance of daily weights, instructed to weigh daily, first thing in am and record, reviewed reason to call MD, weight gain greater than 3 pounds  in 24 hours and 3 pounds in a week, wife verbalized understanding.    [facility] CM Care Plan Problem Two        Most Recent Value   Role Documenting the Problem Two  Care Management Coordinator     Joylene Draft, RN, [place] [facility] [phone]- Mobile [phone]- Toll Free Main Office

## 2015-06-12 NOTE — Progress Notes (Signed)
Cardiology Office Note   Date:  06/13/2015   ID:  Bradley [name], DOB February 25, 1946, MRN [id number]  PCP:   Melinda Crutch, MD  Cardiologist:  Dr. Daneen Schick   Electrophysiologist:  n/a  Chief Complaint  Patient presents with  . Hospitalization Follow-up    Status post CABG  . Coronary Artery Disease     History of Present Illness: Bradley [name] is a 69 y.o. male with a hx of AAA, PAD with claudication s/p aorto-bifemoral bypass in 1998, carotid stenosis s/p L CEA in 1994 and redo in 2001, prior CVA, HTN, HL, OSA, RBBB, DM2.  He was evaluated by Dr. Daneen Schick for progressive SOB.  Myoview was obtained and was noted to be intermediate risk.  He was referred for [facility] which demonstrated complex mid LAD stenosis 90+% prior to a trifurcation, 80% Dx stenosis, 75% mid RCA stenosis.  He was referred to Dr. Servando Snare for consideration of CABG.    Admitted 10/12-10/18 and underwent CABG with L-LAD, S-Dx, S-PDA (R thigh vein harvest). Post op course notable for mild ileus and AF with RVR.  He was started on Amiodarone and converted to NSR.    Readmitted 10/19-10/22 with chest pain in the setting of AF with RVR.  Troponin levels were mildly elevated and CXR demonstrated bilateral pleural effusions.  CT was neg for PE. He was placed on IV Diltiazem and Carvedilol was changed to Metoprolol Tartrate.  He converted to NSR.  Of note, Amiodarone was DC'd.  CHADS2-VASc=4.  It was felt that if he has recurrent AFib, he would need anticoagulation.    Returns for FU.  Here today with his wife. He continues to feel quite fatigued. He also notes dyspnea with walking. Still has chest soreness. Denies fevers. He does have a cough. This is nonproductive and seems to be resolving. He does have a history of wheezing. This is unchanged. He's been sleeping in a recliner secondary to pain. He had some constipation related to tramadol. His pain has gotten worse since stopping the tramadol to help with his constipation. He  denies PND. Sleeps with CPAP. Denies edema. Denies syncope.    Studies/Reports Reviewed Today:  Carotid US 05/18/15 RICA 60-79%, L CEA patent with 1-39% stenosis  LHC 05/17/15 LAD: Mid 90%, D2 80% RCA: Proximal 35%, mid 75%  Complex mid LAD, calcified 90+ percent stenosis prior to a trifurcation. The larger diagonal branch also has 80% stenosis.  75% mid RCA. RCA is a dominant vessel. The proximal RCA contains an eccentric region of calcification or dissection.  Widely patent circumflex  Overall normal LV function  The patient's clinical presentation is that of exertional dyspnea and not angina. In this prior smoker concerned that dyspnea may be more pulmonary than cardiac needs to be considered  Myoview 05/08/15 EF 52%, inferior fixed defect consistent with diaphragmatic attenuation, intermediate risk secondary to poor exercise tolerance and symptoms during stress  Echo 05/08/15 GLS -15.2%, EF 0000000, grade 1 diastolic dysfunction, normal wall motion, aortic sclerosis, dilated aortic root 39 mm, atrial septal lipomatous hypertrophy    Past Medical History  Diagnosis Date  . Hyperlipidemia   . Hypertension   . Diabetes mellitus age 18  . Peripheral vascular disease ([place])   . Arthritis   . Reflux esophagitis   . Stroke [facility]) 1987    Right brain stroke  . Neuropathy ([place]) 2013  . Osteoporosis 2013  . Sleep apnea     uses CPAP  . GERD (gastroesophageal reflux  disease)   . H/O hiatal hernia   . Irritable bowel syndrome (IBS)   . Myocardial infarction [facility])     silent inferior MI; patient denies MI history (03/17/13)   . Anemia   . Carotid artery occlusion     a. Carotid US 10/16: RICA 60-79%, L CEA patent with 1-39% stenosis  . Coronary artery disease     a. Myoview 9/16:  EF 52%, inferior fixed defect consistent with diaphragmatic attenuation, intermediate risk secondary to poor exercise tolerance and symptoms during stress;  b. LHC 05/17/2015 90% mid LAD, 80% ost D2, 75%  mid RCA, 35% prox RCA. >> S/p CABG  . Diverticulitis   . Wears glasses   . PAF (paroxysmal atrial fibrillation) ([place])     post CABG; Amiodarone stopped 2/2 wheezing;   . History of echocardiogram     a. Echo 9/16: GLS -15.2%, EF 0000000, grade 1 diastolic dysfunction, normal wall motion, aortic sclerosis, dilated aortic root 39 mm, atrial septal lipomatous hypertrophy    Past Surgical History  Procedure Laterality Date  . Carotid endarterectomy  1994 & redo 2001    Left  . Pr vein bypass graft,aorto-fem-pop  1998  . Iliac artery stent    . Fracture surgery  2013    Right   foo  t X's 2  . Tonsillectomy    . Posterior lumbar fusion  Aug. 14, 2014    Level 1  . Spine surgery  2014  . Cardiac catheterization N/A 05/17/2015    Procedure: Left Heart Cath and Coronary Angiography;  Surgeon: Belva Crome, MD;  Location: [place] [facility];  Service: Cardiovascular;  Laterality: N/A;  . Colonoscopy w/ biopsies and polypectomy    . Coronary artery bypass graft N/A 05/23/2015    Procedure: CORONARY ARTERY BYPASS GRAFTING (CABG) x 3 (LIMA to LAD, SVG to DIAGONAL 2, SVG to PDA) with Endoscopic Vein Harvesting from right greater saphenous vein;  Surgeon: Grace Isaac, MD;  Location: [place];  Service: Open Heart Surgery;  Laterality: N/A;  . Tee without cardioversion N/A 05/23/2015    Procedure: TRANSESOPHAGEAL ECHOCARDIOGRAM (TEE);  Surgeon: Grace Isaac, MD;  Location: [place];  Service: Open Heart Surgery;  Laterality: N/A;     Current Outpatient Prescriptions  Medication Sig Dispense Refill  . Ascorbic Acid (VITAMIN C) 100 MG tablet Take 100 mg by mouth daily.    Marland Kitchen aspirin EC 81 MG EC tablet Take 1 tablet (81 mg total) by mouth daily.    Marland Kitchen atorvastatin (LIPITOR) 40 MG tablet Take 40 mg by mouth daily.      . Calcium Carbonate-Vitamin D (CALCIUM-D) 600-400 MG-UNIT TABS Take 1 tablet by mouth daily.     . Cholecalciferol (D-3-5) 5000 UNITS capsule Take 5,000 Units by mouth daily.    Marland Kitchen  dexlansoprazole (DEXILANT) 60 MG capsule Take 60 mg by mouth daily.    Marland Kitchen diltiazem (CARDIZEM CD) 300 MG 24 hr capsule Take 1 capsule (300 mg total) by mouth daily. 30 capsule 1  . glimepiride (AMARYL) 2 MG tablet Take 2 mg by mouth daily before breakfast.      . IRON, FERROUS GLUCONATE, PO Take 40 mg by mouth 2 (two) times daily.    . Liraglutide (VICTOZA [place]) Inject 1.8 mg into the skin daily.     . metFORMIN (GLUCOPHAGE-XR) 500 MG 24 hr tablet Take 2 tablets (1,000 mg total) by mouth 2 (two) times daily.    . metoprolol (LOPRESSOR) 50 MG tablet  Take 1 tablet (50 mg total) by mouth 3 (three) times daily. 90 tablet 11  . Multiple Vitamin (MULTIVITAMIN) capsule Take 1 capsule by mouth daily.      . traMADol (ULTRAM) 50 MG tablet Take 1 tablet (50 mg total) by mouth every 6 (six) hours as needed for moderate pain. 30 tablet 0   No current facility-administered medications for this visit.    Allergies:   Dilaudid cough; Hydrocodone; Oxycodone; Penicillins; and Sulfa drugs cross reactors    Social History:   Social History   Social History  . Marital Status: Married    Spouse Name: N/A  . Number of Children: N/A  . Years of Education: N/A   Social History Main Topics  . Smoking status: Former Smoker -- 1.00 packs/day for 40 years    Types: Cigarettes    Quit date: 03/26/2009  . Smokeless tobacco: Never Used  . Alcohol Use: No  . Drug Use: No  . Sexual Activity: Not Asked   Other Topics Concern  . None   Social History Narrative     Family History:   Family History  Problem Relation Age of Onset  . Cancer Mother 59    pancreatic  . Hyperlipidemia Mother   . Stroke Father 10  . Deep vein thrombosis Father   . Hyperlipidemia Father   . Hypertension Father       ROS:   Please see the history of present illness.   Review of Systems  Constitution: Positive for malaise/fatigue.  Cardiovascular: Positive for chest pain and dyspnea on exertion.  Respiratory: Positive for  snoring and wheezing.   Musculoskeletal: Positive for back pain.  Gastrointestinal: Positive for constipation.  All other systems reviewed and are negative.     PHYSICAL EXAM: VS:  BP 132/74 mmHg  Pulse 95  Ht 6' (1.829 m)  Wt 201 lb (91.173 kg)  BMI 27.25 kg/m2    Wt Readings from Last 3 Encounters:  06/13/15 201 lb (91.173 kg)  06/04/15 202 lb 11.2 oz (91.944 kg)  05/29/15 217 lb 13 oz (98.8 kg)     GEN: Well nourished, well developed, in no acute distress HEENT: normal Neck: no JVD, no masses Cardiac:  Normal S1/S2, RRR; no murmur ,  no rubs or gallops, no edema   Chest: Median sternotomy well-healed without erythema or discharge Respiratory:  Decreased breath sounds bilaterally, no wheezing, rhonchi or rales. GI: soft, nontender, nondistended, + BS MS: no deformity or atrophy Skin: warm and dry  Neuro:  CNs II-XII intact, Strength and sensation are intact Psych: Normal affect   EKG:  EKG is ordered today.  It demonstrates:   NSR, HR 95, RBBB, QTc 454 ms   Recent Labs: 04/30/2015: Pro B Natriuretic peptide (BNP) 6.0 05/24/2015: Magnesium 1.9 05/26/2015: TSH 2.630 05/30/2015: ALT 18 06/01/2015: BUN 28*; Creatinine, Ser 1.66*; Hemoglobin 9.5*; Platelets 374; Potassium 3.9; Sodium 136    Lipid Panel    Component Value Date/Time   CHOL 82 06/01/2015 0315   TRIG 158* 06/01/2015 0315   HDL 24* 06/01/2015 0315   CHOLHDL 3.4 06/01/2015 0315   VLDL 32 06/01/2015 0315   LDLCALC 26 06/01/2015 0315      ASSESSMENT AND PLAN:  1. CAD:  S/p CABG.  Slow to progress. Hospitalization was complicated by recurrent atrial fibrillation with RVR. He has dyspnea as well as fatigue. He does tell me that his heart rate is in the 90s to low 100s in the mornings. I suspect he is  mainly deconditioned. He was using a walker in the hospital. He's been trying to go without his walker since coming home. He has not yet started cardiac rehabilitation. His appetite is good. Lungs are clear.   Incisions are healing well.  He has stopped using tramadol secondary to constipation. I have asked him to continue to increase his activity. He may use his walker if he needs the support. He will continue on aspirin, statin, beta blocker.  I will make sure he has a referral sent to cardiac rehabilitation at [place] BMET, CBC today.  2. PAF:  Maintaining NSR. His heart rate is faster than what I would like to see. Continue Cardizem 300 mg daily. Increase metoprolol tartrate to 50 mg 3 times a day. As noted, if he has recurrent atrial fibrillation, he will need anticoagulation.  The beta-blocker may be making him fatigued as well. However, I am more concerned about his HR remaining in the 90s.   3. HTN:  Controlled.  4. Hyperlipidemia:  Continue statin.  5. CKD:  Stage III. With his recent fatigue, repeat BMET.  6. DM2:  FU with PCP.  Eventually try to resume angiotensin receptor blocker.  7. OSA:  Continue CPAP.  8. Carotid Stenosis:   FU with Dr. Donnetta Hutching with [facility] as planned.   9. PAD:   FU with Dr. Donnetta Hutching with [facility] as planned. He was taken off of Plavix at the time of her surgery.  He will ask Dr. Servando Snare at his appointment next week if he can resume this medication.  10. Dyspnea:  As noted I suspect his fatigue and DOE is related to deconditioning. Lungs are clear and CXR last week demonstrated just small residual effusions.  His PFTs do demonstrated mild obstructive defect.  No wheezing on exam and metoprolol is cardioselective.  I will not change his beta-blocker at this time.     Medication Changes: Current medicines are reviewed at length with the patient today.  Concerns regarding medicines are as outlined above.  The following changes have been made:   Discontinued Medications   TRAMADOL (ULTRAM) 50 MG TABLET    Take 1 tablet (50 mg total) by mouth every 6 (six) hours as needed for moderate pain.   Modified Medications   Modified Medication Previous Medication    METOPROLOL (LOPRESSOR) 50 MG TABLET metoprolol (LOPRESSOR) 50 MG tablet      Take 1 tablet (50 mg total) by mouth 3 (three) times daily.    Take 1 tablet (50 mg total) by mouth 2 (two) times daily.   New Prescriptions   No medications on file   Labs/ tests ordered today include:   Orders Placed This Encounter  Procedures  . Basic Metabolic Panel (BMET)  . CBC w/Diff  . EKG 12-Lead      Disposition:    FU with Dr. Tamala Julian or me in 3-4 weeks. Keep follow-up with Dr. Servando Snare next week as planned.    Signed, Versie Starks, MHS 06/13/2015 1:09 PM    [facility] [address], [place], [place]  [address] Phone: [phone]; Fax: [phone]

## 2015-06-13 ENCOUNTER — Encounter: Payer: Self-pay | Admitting: Physician Assistant

## 2015-06-13 ENCOUNTER — Ambulatory Visit (INDEPENDENT_AMBULATORY_CARE_PROVIDER_SITE_OTHER): Payer: PPO | Admitting: Physician Assistant

## 2015-06-13 VITALS — BP 132/74 | HR 95 | Ht 72.0 in | Wt 201.0 lb

## 2015-06-13 DIAGNOSIS — I10 Essential (primary) hypertension: Secondary | ICD-10-CM | POA: Diagnosis not present

## 2015-06-13 DIAGNOSIS — I6523 Occlusion and stenosis of bilateral carotid arteries: Secondary | ICD-10-CM

## 2015-06-13 DIAGNOSIS — E1122 Type 2 diabetes mellitus with diabetic chronic kidney disease: Secondary | ICD-10-CM

## 2015-06-13 DIAGNOSIS — I2510 Atherosclerotic heart disease of native coronary artery without angina pectoris: Secondary | ICD-10-CM | POA: Diagnosis not present

## 2015-06-13 DIAGNOSIS — I480 Paroxysmal atrial fibrillation: Secondary | ICD-10-CM | POA: Diagnosis not present

## 2015-06-13 DIAGNOSIS — N183 Chronic kidney disease, stage 3 unspecified: Secondary | ICD-10-CM

## 2015-06-13 DIAGNOSIS — G4733 Obstructive sleep apnea (adult) (pediatric): Secondary | ICD-10-CM

## 2015-06-13 DIAGNOSIS — R0602 Shortness of breath: Secondary | ICD-10-CM

## 2015-06-13 DIAGNOSIS — E785 Hyperlipidemia, unspecified: Secondary | ICD-10-CM | POA: Diagnosis not present

## 2015-06-13 DIAGNOSIS — I739 Peripheral vascular disease, unspecified: Secondary | ICD-10-CM

## 2015-06-13 LAB — CBC WITH DIFFERENTIAL/PLATELET
Basophils Absolute: 0 10*3/uL (ref 0.0–0.1)
Basophils Relative: 0 % (ref 0–1)
Eosinophils Absolute: 0.2 10*3/uL (ref 0.0–0.7)
Eosinophils Relative: 2 % (ref 0–5)
HCT: 34.8 % — ABNORMAL LOW (ref 39.0–52.0)
Hemoglobin: 11.3 g/dL — ABNORMAL LOW (ref 13.0–17.0)
Lymphocytes Relative: 17 % (ref 12–46)
Lymphs Abs: 1.5 10*3/uL (ref 0.7–4.0)
MCH: 31.3 pg (ref 26.0–34.0)
MCHC: 32.5 g/dL (ref 30.0–36.0)
MCV: 96.4 fL (ref 78.0–100.0)
MPV: 8.5 fL — ABNORMAL LOW (ref 8.6–12.4)
Monocytes Absolute: 0.6 10*3/uL (ref 0.1–1.0)
Monocytes Relative: 7 % (ref 3–12)
Neutro Abs: 6.4 10*3/uL (ref 1.7–7.7)
Neutrophils Relative %: 74 % (ref 43–77)
Platelets: 429 10*3/uL — ABNORMAL HIGH (ref 150–400)
RBC: 3.61 MIL/uL — ABNORMAL LOW (ref 4.22–5.81)
RDW: 13.8 % (ref 11.5–15.5)
WBC: 8.7 10*3/uL (ref 4.0–10.5)

## 2015-06-13 LAB — BASIC METABOLIC PANEL
BUN: 18 mg/dL (ref 7–25)
CO2: 23 mmol/L (ref 20–31)
Calcium: 9.5 mg/dL (ref 8.6–10.3)
Chloride: 98 mmol/L (ref 98–110)
Creat: 1.41 mg/dL — ABNORMAL HIGH (ref 0.70–1.18)
Glucose, Bld: 87 mg/dL (ref 65–99)
Potassium: 4.1 mmol/L (ref 3.5–5.3)
Sodium: 131 mmol/L — ABNORMAL LOW (ref 135–146)

## 2015-06-13 MED ORDER — METOPROLOL TARTRATE 50 MG PO TABS: 50.0000 mg | ORAL_TABLET | Freq: Three times a day (TID) | ORAL | Status: DC

## 2015-06-13 NOTE — Patient Instructions (Signed)
Medication Instructions:  1. INCREASE METOPROLOL 50 MG TO THREE TIMES DAILY; NEW RX SENT IN  Labwork: TODAY BMET, CBC W/DIFF  Testing/Procedures: NONE  Follow-Up: 3-4 WEEKS WITH DR. Tamala Julian OR SC[facility] WEAVER, PAC SAME DAY DR. Tamala Julian IS IN THE OFFICE  Any Other Special Instructions Will Be Listed Below (If Applicable).  CARDIAC REHAB AT [facility]   If you need a refill on your cardiac medications before your next appointment, please call your pharmacy.

## 2015-06-14 ENCOUNTER — Other Ambulatory Visit: Payer: Self-pay

## 2015-06-14 ENCOUNTER — Ambulatory Visit: Payer: Self-pay

## 2015-06-14 ENCOUNTER — Other Ambulatory Visit: Payer: Self-pay | Admitting: Cardiothoracic Surgery

## 2015-06-14 DIAGNOSIS — Z951 Presence of aortocoronary bypass graft: Secondary | ICD-10-CM

## 2015-06-14 NOTE — Patient Outreach (Signed)
Week 2 Transition of Care: Placed call to patient who reports that he is doing well. States that he had a hospital follow up yesterday and that he got a good report. Reports that he is weighing daily without any recent weight gain. Reports that his CBG are good. Today's fasting CBG of 107.    Reviewed all discharge medications and patient is taking medications as prescribed.  Denies any new needs or concerns at this time. Offered home visit and patient states that he does think he needs one. Plan: Will provide updated to primary RN Landis Martins and patient will continue to have weekly transition of care calls. Reminded patient to call for any concerns or questions.  THN CM Care Plan Problem One        Most Recent Value   Care Plan Problem One  Patient with recent admission related to heart surgery, at risk for readmission   Role Documenting the Problem One  Care Management [place] for Problem One  Active   THN Long Term Goal (31-90 days)  Patient will verbalize no readmission in next 31 days   THN Long Term Goal Start Date  06/06/15   Interventions for Problem One Long Term Goal  reviewed all discharged medications with patient. Reviewed importance of calling MD for concerns or problems. Offered home visit-declined, Encouraged patient to continue to weigh and monitor CBG daily.    THN CM Short Term Goal #1 (0-30 days)  Patient and wife will verbalize follow up with primary care provider within 7 days    [facility] CM Short Term Goal #1 Start Date  06/06/15   [facility] CM Short Term Goal #1 Met Date  06/14/15 [goal met. follow up done on 11/2]   Interventions for Short Term Goal #1  Explained reasoning of importance of early follow up , wife able to list follow up appointment dates   [facility] CM Short Term Goal #2 (0-30 days)  Patient will weigh and record daily for the next 30 days   THN CM Short Term Goal #2 Start Date  06/06/15   Interventions for Short Term Goal #2  Reinforced needs for daily  weights and record keeping. Encouraged patient to call md for weight gain.      Tomasa Rand, RN, BSN, CEN [facility] [employer] [phone]

## 2015-06-15 ENCOUNTER — Telehealth: Payer: Self-pay | Admitting: *Deleted

## 2015-06-15 NOTE — Telephone Encounter (Signed)
Pt and wife both notified of lab results by phone with verbal understanding.

## 2015-06-18 ENCOUNTER — Ambulatory Visit
Admission: RE | Admit: 2015-06-18 | Discharge: 2015-06-18 | Disposition: A | Discharge status: Home / Self Care | Payer: PPO | Source: Ambulatory Visit | Attending: Cardiothoracic Surgery | Admitting: Cardiothoracic Surgery

## 2015-06-18 ENCOUNTER — Ambulatory Visit (INDEPENDENT_AMBULATORY_CARE_PROVIDER_SITE_OTHER): Payer: Self-pay | Admitting: Physician Assistant

## 2015-06-18 VITALS — BP 127/81 | HR 86 | Resp 20 | Ht 72.0 in | Wt 197.0 lb

## 2015-06-18 DIAGNOSIS — I25810 Atherosclerosis of coronary artery bypass graft(s) without angina pectoris: Secondary | ICD-10-CM

## 2015-06-18 DIAGNOSIS — Z951 Presence of aortocoronary bypass graft: Secondary | ICD-10-CM

## 2015-06-18 NOTE — Progress Notes (Signed)
[address]       [place] [address]             [phone]          HPI: Patient returns for routine postoperative follow-up having undergone CABG x 3 by Dr. Servando Snare on 05/23/2015.  The patient's postoperative course was notable for atrial fibrillation which was treated with Amiodarone. He was discharged home on 05/29/2015, but was readmitted the following day after he developed chest discomfort and recurrent AF with RVR.  He was seen by cardiology and started on Cardizem and Lopressor and did ultimately convert to sinus rhythm. He was also noted to have disruption of the cartilage to the left of the incision, but no sternal instability.  He was able to be discharged on 06/02/2015.  Since hospital discharge, the patient has made slow but steady progress.  He is walking daily without dyspnea, but feels that his "legs give out easily". He denies any further chest pain other than the usual soreness. He also denies lower extremity edema or palpitations.  He was seen by Richardson Dopp, PA-C in the cardiology office on 11/1 and was still in sinus rhythm, although mildly tachycardic.  His Lopressor dose was increased to 50 mg tid, but the patient states he has not started the new dose yet because the Lopressor makes him feel fatigued.  He is otherwise doing well and has no complaints today.     Current Outpatient Prescriptions  Medication Sig Dispense Refill  . Ascorbic Acid (VITAMIN C) 100 MG tablet Take 100 mg by mouth daily.    Marland Kitchen aspirin EC 81 MG EC tablet Take 1 tablet (81 mg total) by mouth daily.    Marland Kitchen atorvastatin (LIPITOR) 40 MG tablet Take 40 mg by mouth daily.      . Calcium Carbonate-Vitamin D (CALCIUM-D) 600-400 MG-UNIT TABS Take 1 tablet by mouth daily.     . Cholecalciferol (D-3-5) 5000 UNITS capsule Take 5,000 Units by mouth daily.    Marland Kitchen dexlansoprazole (DEXILANT) 60 MG capsule Take 60 mg by mouth daily.    Marland Kitchen diltiazem (CARDIZEM CD) 300 MG 24 hr capsule Take  1 capsule (300 mg total) by mouth daily. 30 capsule 1  . glimepiride (AMARYL) 2 MG tablet Take 2 mg by mouth daily before breakfast.      . IRON, FERROUS GLUCONATE, PO Take 40 mg by mouth 2 (two) times daily.    . Liraglutide (VICTOZA [place]) Inject 1.8 mg into the skin daily.     . metFORMIN (GLUCOPHAGE-XR) 500 MG 24 hr tablet Take 2 tablets (1,000 mg total) by mouth 2 (two) times daily.    . metoprolol (LOPRESSOR) 50 MG tablet Take 1 tablet (50 mg total) by mouth 3 (three) times daily. 90 tablet 11  . Multiple Vitamin (MULTIVITAMIN) capsule Take 1 capsule by mouth daily.      . traMADol (ULTRAM) 50 MG tablet Take 1 tablet (50 mg total) by mouth every 6 (six) hours as needed for moderate pain. 30 tablet 0   No current facility-administered medications for this visit.     Physical Exam: BP 127/81 HR 86 Resp 20 Wounds: Clean and dry. Sternum stable. Heart: regular rate and rhythm Lungs: Clear to auscultation Extremities: No lower extremity edema   Diagnostic Tests: Chest xray: Dg Chest 2 View  06/18/2015  CLINICAL DATA:  Status post CABG on May 23, 2015, no current chest complaints EXAM: CHEST  2 VIEW COMPARISON:  PA and lateral chest x-ray of May 31, 2015 FINDINGS: There has been further interval decrease in the volume of pleural fluid layering posteriorly and laterally on the left. Both lungs are clear. There is no pneumothorax. The heart and pulmonary vascularity are normal. There is 7 intact sternal wires. The retrosternal soft tissues are normal. The thoracic spine exhibits only minimal degenerative change. IMPRESSION: Ongoing resolution of the small left basilar pleural effusion. There is no evidence of CHF, pneumonia, nor other acute cardiopulmonary abnormality. Electronically Signed   By: David  Martinique M.D.   On: 06/18/2015 11:59       Assessment/Plan: The patient is doing well overall status post CABG.  He remains in sinus rhythm today and blood pressure is stable. I have  discussed with him that he needs to begin the increased dose of Lopressor as prescribed by cardiology and to contact them if he continues to have problems with the medication. He is not taking any pain medication at this point, so he may begin driving and increasing his activity as tolerated.  He also is ok from our standpoint to  begin outpatient cardiac rehab. We will see him back in 3-4 weeks for recheck and he may call in the interim if he has any problems.

## 2015-06-21 ENCOUNTER — Encounter: Payer: Self-pay | Admitting: *Deleted

## 2015-06-21 ENCOUNTER — Other Ambulatory Visit: Payer: Self-pay | Admitting: *Deleted

## 2015-06-21 NOTE — Patient Outreach (Signed)
[facility]) Care Management  [place]  06/21/2015   Bradley [name] June 08, 1946 [id number]  Transition of care week #3  Subjective: "Everything is going well, I just finished walking.  Current Medications:  Current Outpatient Prescriptions  Medication Sig Dispense Refill  . Ascorbic Acid (VITAMIN C) 100 MG tablet Take 100 mg by mouth daily.    Marland Kitchen aspirin EC 81 MG EC tablet Take 1 tablet (81 mg total) by mouth daily.    Marland Kitchen atorvastatin (LIPITOR) 40 MG tablet Take 40 mg by mouth daily.      . Calcium Carbonate-Vitamin D (CALCIUM-D) 600-400 MG-UNIT TABS Take 1 tablet by mouth daily.     . Cholecalciferol (D-3-5) 5000 UNITS capsule Take 5,000 Units by mouth daily.    Marland Kitchen dexlansoprazole (DEXILANT) 60 MG capsule Take 60 mg by mouth daily.    Marland Kitchen diltiazem (CARDIZEM CD) 300 MG 24 hr capsule Take 1 capsule (300 mg total) by mouth daily. 30 capsule 1  . glimepiride (AMARYL) 2 MG tablet Take 2 mg by mouth daily before breakfast.      . IRON, FERROUS GLUCONATE, PO Take 40 mg by mouth 2 (two) times daily.    . Liraglutide (VICTOZA [place]) Inject 1.8 mg into the skin daily.     . metFORMIN (GLUCOPHAGE-XR) 500 MG 24 hr tablet Take 2 tablets (1,000 mg total) by mouth 2 (two) times daily.    . metoprolol (LOPRESSOR) 50 MG tablet Take 1 tablet (50 mg total) by mouth 3 (three) times daily. 90 tablet 11  . Multiple Vitamin (MULTIVITAMIN) capsule Take 1 capsule by mouth daily.      . traMADol (ULTRAM) 50 MG tablet Take 1 tablet (50 mg total) by mouth every 6 (six) hours as needed for moderate pain. 30 tablet 0   No current facility-administered medications for this visit.    Functional Status:  In your present state of health, do you have any difficulty performing the following activities: 06/21/2015 05/30/2015  Hearing? N N  Vision? N N  Difficulty concentrating or making decisions? N N  Walking or climbing stairs? N N  Dressing or bathing? N N  Doing errands, shopping? N N   Preparing Food and eating ? N -  Using the Toilet? N -  In the past six months, have you accidently leaked urine? N -  Do you have problems with loss of bowel control? N -  Managing your Medications? N -  Managing your Finances? N -  Housekeeping or managing your Housekeeping? N -    Fall/Depression Screening: PHQ 2/9 Scores 06/21/2015  PHQ - 2 Score 0   Fall Risk  06/21/2015  Falls in the past year? No   Assessment:  Patient progressing well after surgery, tolerating taking a shower daily, reports incisions healing well, he is  walking daily, denies pain and reports that he has not had a need to take pain medication, denies constipation, reports appetite has been good. Bradley [name] continues to weigh daily , without weight gains today weight is 196 and his blood sugar this am is 124. Bradley [name] reports that he is taking his medication as prescribed and that he is now taking the Metoprolol 50 mg three times a day as ordered and tolerating okay so far.  Bradley [name] states that he is managing well at home, and declines home visit from [facility] care management coordinator.  Plan:  Reminded patient to call for concerns or questions Scheduled transition of care call for next week [facility]  CM Care Plan Problem One        Most Recent Value   Care Plan Problem One  Patient with recent admission related to heart surgery, at risk for readmission   Role Documenting the Problem One  Care Management [place] for Problem One  Active   [facility] Long Term Goal (31-90 days)  Patient will verbalize no readmission in next 31 days   [facility] Long Term Goal Start Date  06/06/15   Interventions for Problem One Long Term Goal  reviewed all discharged medications with patient. Reviewed importance of calling MD for concerns or problems. Offered home visit-declined, Encouraged patient to continue to weigh and monitor CBG daily.    [facility] CM Short Term Goal #1 (0-30 days)  Patient and wife will verbalize follow up with  primary care provider within 7 days    [facility] CM Short Term Goal #1 Start Date  06/06/15   [facility] CM Short Term Goal #1 Met Date  06/14/15 [goal met. follow up done on 11/2]   Interventions for Short Term Goal #1  Explained reasoning of importance of early follow up , wife able to list follow up appointment dates   [facility] CM Short Term Goal #2 (0-30 days)  Patient will weigh and record daily for the next 30 days   [facility] CM Short Term Goal #2 Start Date  06/06/15   Interventions for Short Term Goal #2  Reinforced needs for daily weights and record keeping. Encouraged patient to call md for weight gain.    Joylene Draft, RN, [place] [facility] [phone]- Mobile [phone]- Toll Free Main Office

## 2015-06-22 IMAGING — CT CT ABD-PELV W/ CM
2 of 5 series · 17 of 46 positions shown, 19 images · IV contrast (READICAT/WATER & [ID] OMNI 300)
Comparison: None.

CLINICAL DATA: 67-year-old male with abdominal and pelvic pain,
weight loss and blood in stool. History of aortofemoral bypass
graft.

EXAM:
CT ABDOMEN AND PELVIS WITH CONTRAST
TECHNIQUE: Multidetector CT imaging of the abdomen and pelvis was performed
using the standard protocol following bolus administration of
intravenous contrast.
BUN and creatinine were obtained on site at [HOSPITAL] at
[HOSPITAL].
Results:  BUN 16 mg/dL,  Creatinine 0.9 mg/dL.
CONTRAST:  125mL OMNIPAQUE IOHEXOL 300 MG/ML  SOLN

[Series 2: abd/pelvis with · axial · 0.85mm/px · z∈[-450,+10]mm · 14 of 104 slices shown, 16 images]
[im 6/104  soft-tissue]
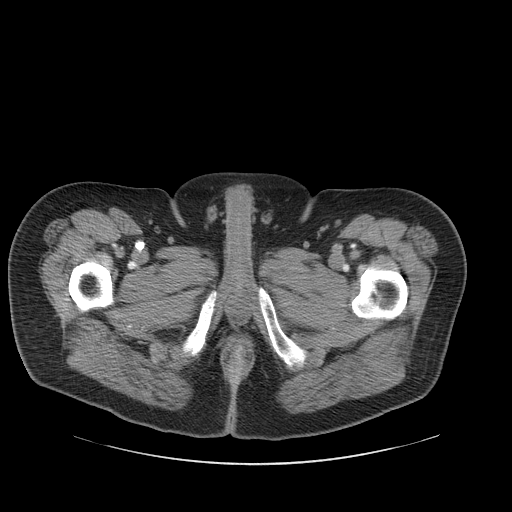
[im 6/104  bone]
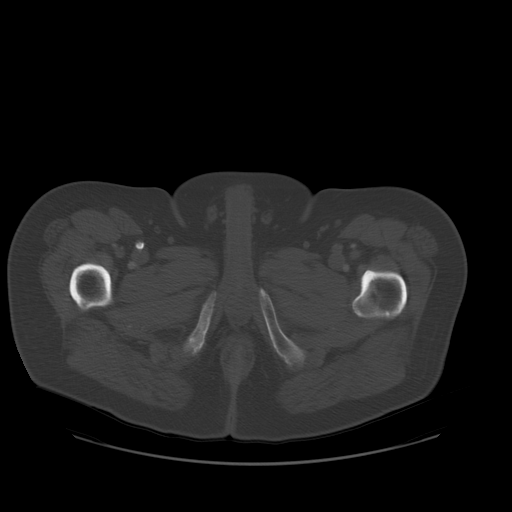
[im 11/104  soft-tissue]
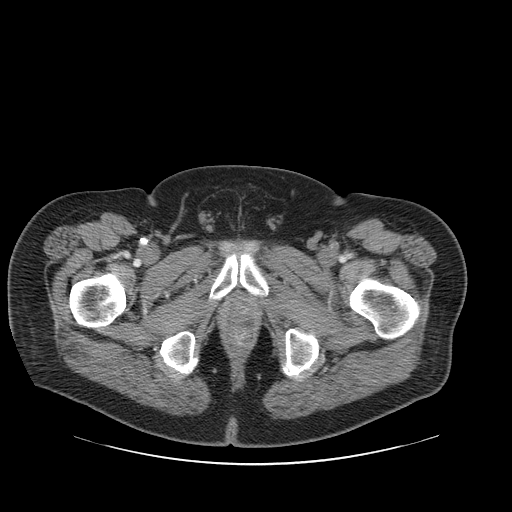
[im 22/104  soft-tissue]
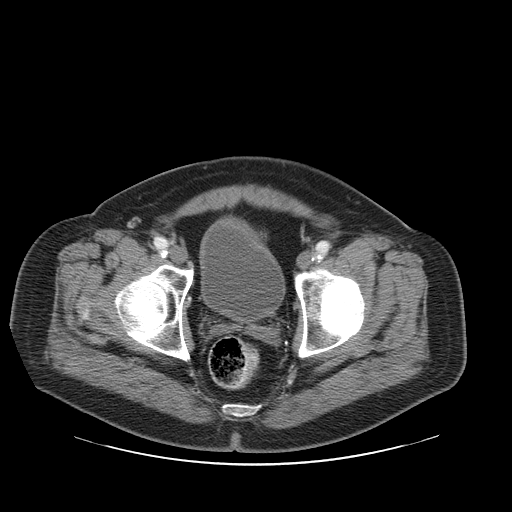
[im 28/104  soft-tissue]
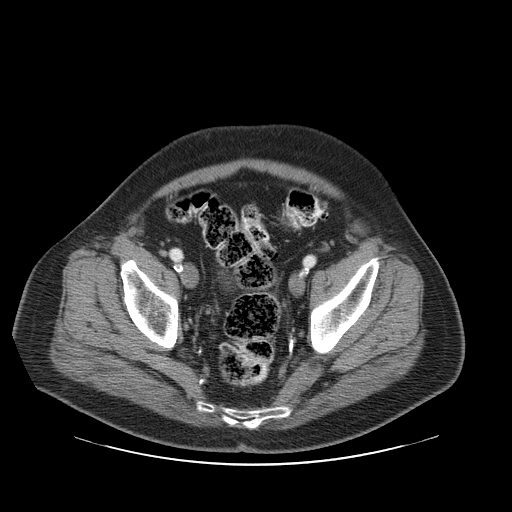
[im 33/104  soft-tissue]
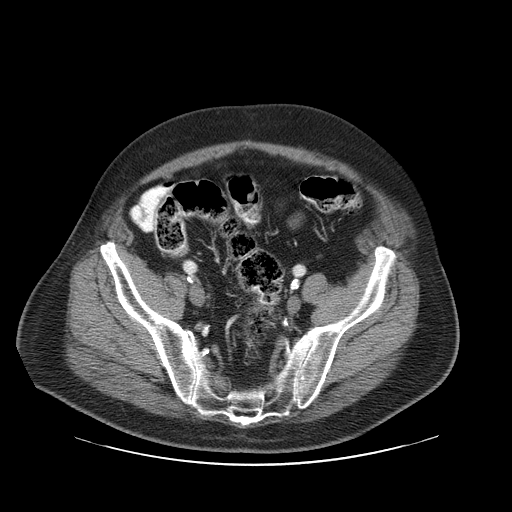
[im 44/104  soft-tissue]
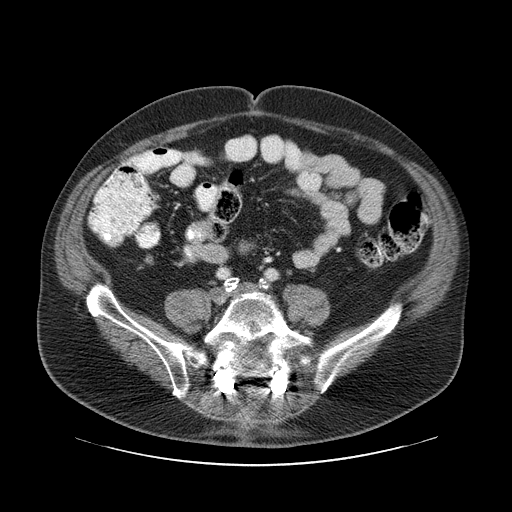
[im 49/104  soft-tissue]
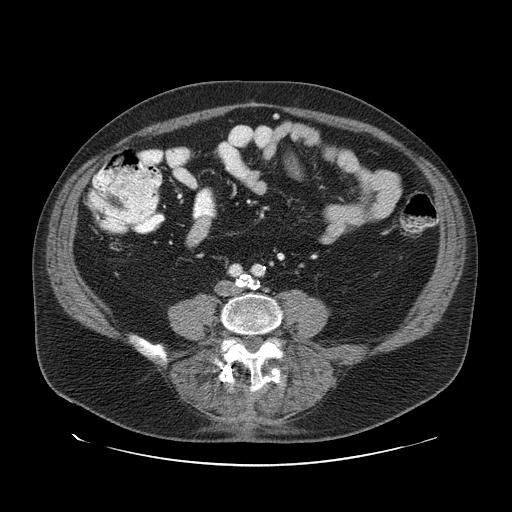
[im 55/104  soft-tissue]
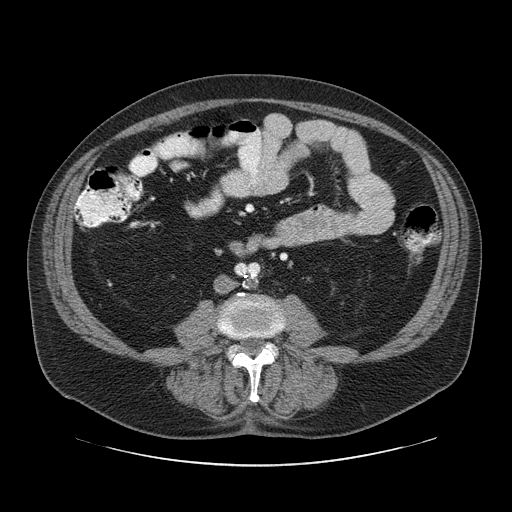
[im 60/104  soft-tissue]
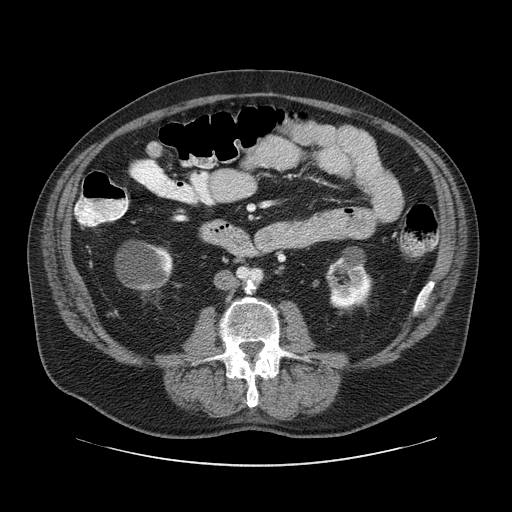
[im 60/104  bone]
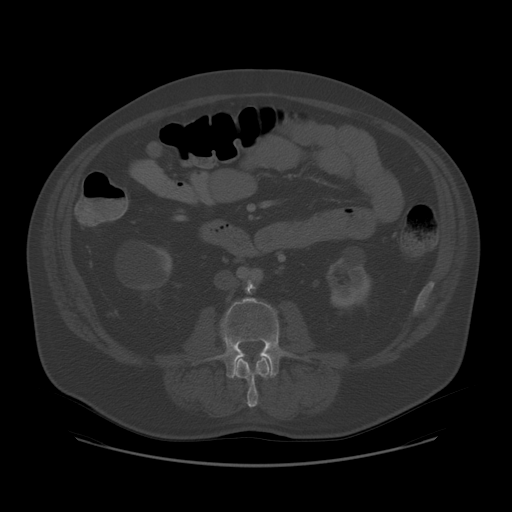
[im 71/104  soft-tissue]
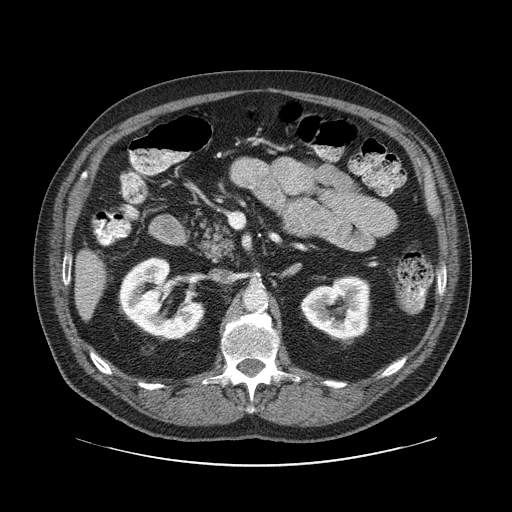
[im 76/104  soft-tissue]
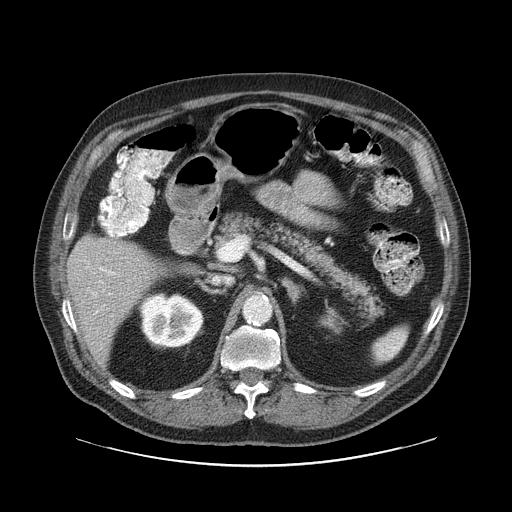
[im 82/104  soft-tissue]
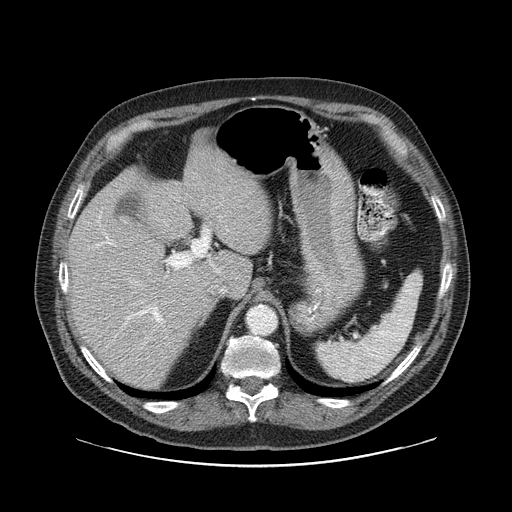
[im 93/104  soft-tissue]
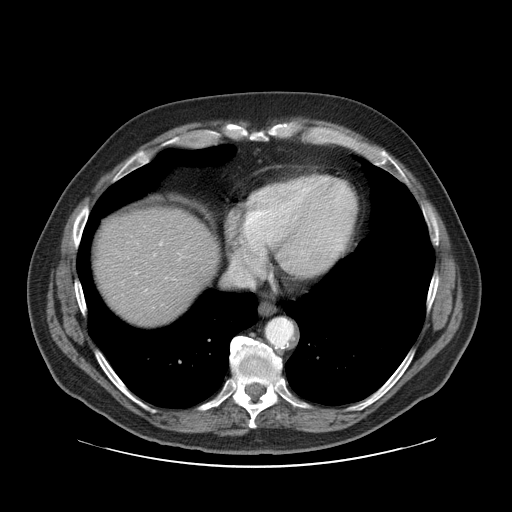
[im 98/104  soft-tissue]
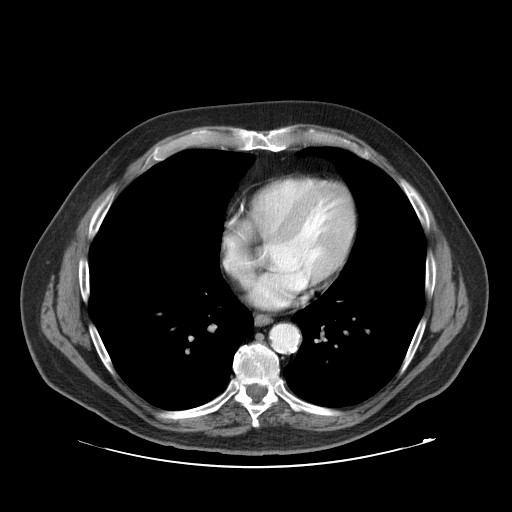

[Series 401: coronal · coronal · 1.03mm/px · 3 of 137 slices shown]
[im 46/137  soft-tissue]
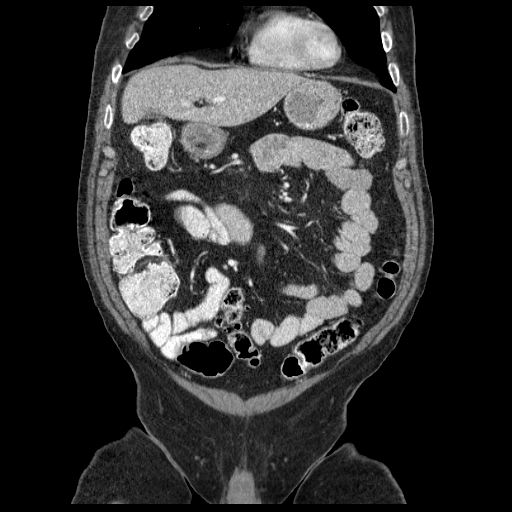
[im 61/137  soft-tissue]
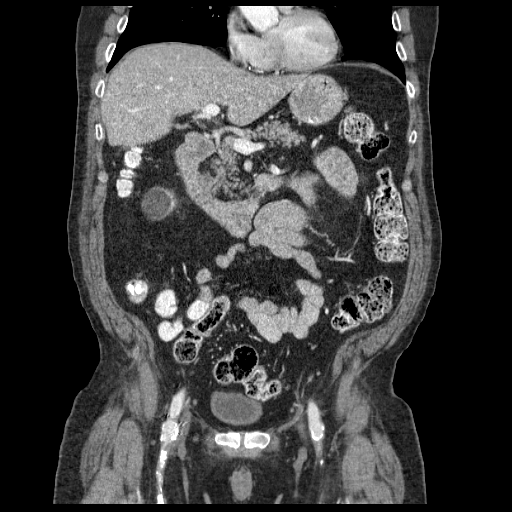
[im 76/137  soft-tissue]
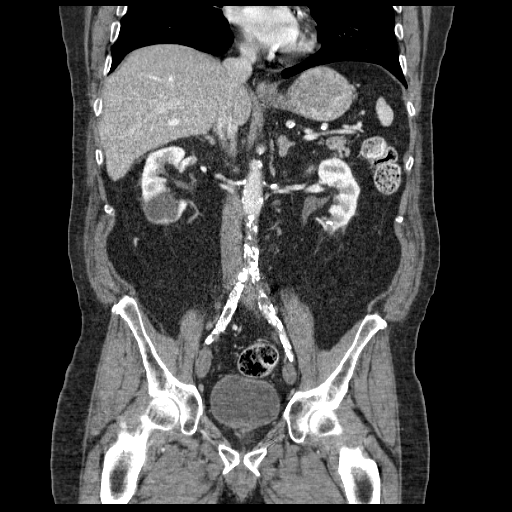

[17 of 46 positions shown; findings below may reference images not displayed]

FINDINGS: The lung bases are clear.

Mild fatty infiltration of the liver is noted. No focal hepatic
abnormalities are identified.

Cholelithiasis identified without CT evidence of acute
cholecystitis.

Renal cysts and bilateral renal cortical atrophy is identified.

The pancreas, spleen and adrenal glands are unremarkable.

There is no evidence of free fluid, enlarged lymph nodes or biliary
dilatation.

Aortofemoral bypass is noted.  The mesenteric arteries are patent.

The bowel, bladder and appendix are unremarkable except for a few
scattered colonic diverticula. There is no evidence of bowel
obstruction, abscess or pneumoperitoneum.

L5-S1 surgical fusion changes are noted. No acute or suspicious bony
abnormalities are identified.
IMPRESSION: No evidence of acute abnormality.

Mild fatty infiltration of the liver and mild bilateral cortical
renal thinning.

Colonic diverticulosis without diverticulitis.

Cholelithiasis without CT evidence of acute cholecystitis.

## 2015-06-27 ENCOUNTER — Other Ambulatory Visit: Payer: Self-pay | Admitting: *Deleted

## 2015-06-27 NOTE — Patient Outreach (Signed)
[facility]) Care Management  [place]  06/27/2015   Bradley [name] 21-May-1946 [id number]  Subjective: "Everything is going well"  Objective:   Current Medications:  Current Outpatient Prescriptions  Medication Sig Dispense Refill  . Ascorbic Acid (VITAMIN C) 100 MG tablet Take 100 mg by mouth daily.    Marland Kitchen aspirin EC 81 MG EC tablet Take 1 tablet (81 mg total) by mouth daily.    Marland Kitchen atorvastatin (LIPITOR) 40 MG tablet Take 40 mg by mouth daily.      . Calcium Carbonate-Vitamin D (CALCIUM-D) 600-400 MG-UNIT TABS Take 1 tablet by mouth daily.     . Cholecalciferol (D-3-5) 5000 UNITS capsule Take 5,000 Units by mouth daily.    Marland Kitchen dexlansoprazole (DEXILANT) 60 MG capsule Take 60 mg by mouth daily.    Marland Kitchen diltiazem (CARDIZEM CD) 300 MG 24 hr capsule Take 1 capsule (300 mg total) by mouth daily. 30 capsule 1  . glimepiride (AMARYL) 2 MG tablet Take 2 mg by mouth daily before breakfast.      . IRON, FERROUS GLUCONATE, PO Take 40 mg by mouth 2 (two) times daily.    . Liraglutide (VICTOZA [place]) Inject 1.8 mg into the skin daily.     . metFORMIN (GLUCOPHAGE-XR) 500 MG 24 hr tablet Take 2 tablets (1,000 mg total) by mouth 2 (two) times daily.    . metoprolol (LOPRESSOR) 50 MG tablet Take 1 tablet (50 mg total) by mouth 3 (three) times daily. 90 tablet 11  . Multiple Vitamin (MULTIVITAMIN) capsule Take 1 capsule by mouth daily.      . traMADol (ULTRAM) 50 MG tablet Take 1 tablet (50 mg total) by mouth every 6 (six) hours as needed for moderate pain. 30 tablet 0   No current facility-administered medications for this visit.    Functional Status:  In your present state of health, do you have any difficulty performing the following activities: 06/21/2015 05/30/2015  Hearing? N N  Vision? N N  Difficulty concentrating or making decisions? N N  Walking or climbing stairs? N N  Dressing or bathing? N N  Doing errands, shopping? N N  Preparing Food and eating ? N -  Using  the Toilet? N -  In the past six months, have you accidently leaked urine? N -  Do you have problems with loss of bowel control? N -  Managing your Medications? N -  Managing your Finances? N -  Housekeeping or managing your Housekeeping? N -    Fall/Depression Screening: PHQ 2/9 Scores 06/21/2015  PHQ - 2 Score 0    Assessment:  Recent Coronary Artery Bypass Surgery: Patient reports that he is managing well at home post surgery. Patient reports that he is tolerating increased activity increasing time on walks, denies difficulty with taking a shower, reports incision areas healing without difficulty , reports taking medication as prescribed with difficulty or concerns.  Patient states that he is managing well at home and denies need for a home visit by care management coordinator. Patient states that is scheduled to begin Cardiac Rehab within the next week.   Plan:  Will close case at the end of transition of care program  Encouraged patient to continue with daily weights and to notify MD of increases in weight, swelling,shortness of breath. Encouraged patient to notify MD of questions, concerns or new symptoms  Joylene Draft, RN, [place] [facility] [phone]- Mobile [phone]- [place]

## 2015-07-02 ENCOUNTER — Encounter: Payer: PPO | Attending: Interventional Cardiology | Admitting: *Deleted

## 2015-07-02 ENCOUNTER — Encounter: Payer: Self-pay | Admitting: *Deleted

## 2015-07-02 VITALS — BP 138/62 | HR 98 | Ht 71.8 in | Wt 196.2 lb

## 2015-07-02 DIAGNOSIS — Z951 Presence of aortocoronary bypass graft: Secondary | ICD-10-CM | POA: Diagnosis present

## 2015-07-02 NOTE — Progress Notes (Signed)
Cardiac Individual Treatment Plan  Patient Details  Name: Bradley [name] MRN: [id number] Date of Birth: 1946/06/14 Referring Provider:  Belva Crome, MD  Initial Encounter Date: Date: 07/02/15  Visit Diagnosis: S/P CABG x 3  Patient's Home Medications on Admission:  Current outpatient prescriptions:  .  Ascorbic Acid (VITAMIN C) 100 MG tablet, Take 100 mg by mouth daily., Disp: , Rfl:  .  aspirin EC 81 MG EC tablet, Take 1 tablet (81 mg total) by mouth daily., Disp: , Rfl:  .  atorvastatin (LIPITOR) 40 MG tablet, Take 40 mg by mouth daily.  , Disp: , Rfl:  .  Calcium Carbonate-Vitamin D (CALCIUM-D) 600-400 MG-UNIT TABS, Take 1 tablet by mouth daily. , Disp: , Rfl:  .  Cholecalciferol (D-3-5) 5000 UNITS capsule, Take 5,000 Units by mouth daily., Disp: , Rfl:  .  dexlansoprazole (DEXILANT) 60 MG capsule, Take 60 mg by mouth daily., Disp: , Rfl:  .  diltiazem (CARDIZEM CD) 300 MG 24 hr capsule, Take 1 capsule (300 mg total) by mouth daily., Disp: 30 capsule, Rfl: 1 .  glimepiride (AMARYL) 2 MG tablet, Take 2 mg by mouth daily before breakfast.  , Disp: , Rfl:  .  IRON, FERROUS GLUCONATE, PO, Take 40 mg by mouth 2 (two) times daily., Disp: , Rfl:  .  Liraglutide (VICTOZA [place]), Inject 1.8 mg into the skin daily. , Disp: , Rfl:  .  metFORMIN (GLUCOPHAGE-XR) 500 MG 24 hr tablet, Take 2 tablets (1,000 mg total) by mouth 2 (two) times daily., Disp: , Rfl:  .  metoprolol (LOPRESSOR) 50 MG tablet, Take 1 tablet (50 mg total) by mouth 3 (three) times daily., Disp: 90 tablet, Rfl: 11 .  Multiple Vitamin (MULTIVITAMIN) capsule, Take 1 capsule by mouth daily.  , Disp: , Rfl:  .  traMADol (ULTRAM) 50 MG tablet, Take 1 tablet (50 mg total) by mouth every 6 (six) hours as needed for moderate pain. (Patient not taking: Reported on 07/02/2015), Disp: 30 tablet, Rfl: 0  Past Medical History: Past Medical History  Diagnosis Date  . Hyperlipidemia   . Hypertension   . Diabetes mellitus age 69  .  Peripheral vascular disease ([place])   . Arthritis   . Reflux esophagitis   . Stroke [employer]) 1987    Right brain stroke  . Neuropathy ([place]) 2013  . Osteoporosis 2013  . Sleep apnea     uses CPAP  . GERD (gastroesophageal reflux disease)   . H/O hiatal hernia   . Irritable bowel syndrome (IBS)   . Myocardial infarction [facility])     silent inferior MI; patient denies MI history (03/17/13)   . Anemia   . Carotid artery occlusion     a. Carotid US 10/16: RICA 60-79%, L CEA patent with 1-39% stenosis  . Coronary artery disease     a. Myoview 9/16:  EF 52%, inferior fixed defect consistent with diaphragmatic attenuation, intermediate risk secondary to poor exercise tolerance and symptoms during stress;  b. LHC 05/17/2015 90% mid LAD, 80% ost D2, 75% mid RCA, 35% prox RCA. >> S/p CABG  . Diverticulitis   . Wears glasses   . PAF (paroxysmal atrial fibrillation) ([place])     post CABG; Amiodarone stopped 2/2 wheezing;   . History of echocardiogram     a. Echo 9/16: GLS -15.2%, EF 0000000, grade 1 diastolic dysfunction, normal wall motion, aortic sclerosis, dilated aortic root 39 mm, atrial septal lipomatous hypertrophy    Tobacco Use: History  Smoking  status  . Former Smoker -- 2.00 packs/day for 40 years  . Types: Cigarettes  . Quit date: 03/26/2009  Smokeless tobacco  . Never Used    Labs: Recent Review Flowsheet Data    Labs for ITP Cardiac and Pulmonary Rehab Latest Ref Rng 05/24/2015 05/24/2015 05/24/2015 05/31/2015 06/01/2015   Cholestrol 0 - 200 mg/dL - - - 83 82   LDLCALC 0 - 99 mg/dL - - - 36 26   HDL >40 mg/dL - - - 26(L) 24(L)   Trlycerides <150 mg/dL - - - 104 158(H)   PHART 7.350 - 7.450 - 7.297(L) - - -   PCO2ART 35.0 - 45.0 mmHg - 43.4 - - -   HCO3 20.0 - 24.0 mEq/L - 21.2 - - -   TCO2 0 - 100 mmol/L 20 22 19  - -   ACIDBASEDEF 0.0 - 2.0 mmol/L - 5.0(H) - - -   O2SAT - - 91.0 - - -       Exercise Target Goals: Date: 07/02/15  Exercise Program Goal: Individual exercise  prescription set with THRR, safety & activity barriers. Participant demonstrates ability to understand and report RPE using BORG scale, to self-measure pulse accurately, and to acknowledge the importance of the exercise prescription.  Exercise Prescription Goal: Starting with aerobic activity 30 plus minutes a day, 3 days per week for initial exercise prescription. Provide home exercise prescription and guidelines that participant acknowledges understanding prior to discharge.  Activity Barriers & Risk Stratification:     Activity Barriers & Risk Stratification - 07/02/15 1348    Activity Barriers & Risk Stratification   Activity Barriers Back Problems   Risk Stratification High      6 Minute Walk:     6 Minute Walk      07/02/15 1444       6 Minute Walk   Phase Initial     Distance 1175 feet     Walk Time 6 minutes     Resting HR 98 bpm     Resting BP 138/62 mmHg     Max Ex. HR 102 bpm     Max Ex. BP 140/78 mmHg     RPE 12     Perceived Dyspnea  0     Symptoms No        Initial Exercise Prescription:     Initial Exercise Prescription - 07/02/15 1400    Date of Initial Exercise Prescription   Date 07/02/15      Exercise Prescription Changes:   Discharge Exercise Prescription (Final Exercise Prescription Changes):   Nutrition:  Target Goals: Understanding of nutrition guidelines, daily intake of sodium 1500mg , cholesterol 200mg , calories 30% from fat and 7% or less from saturated fats, daily to have 5 or more servings of fruits and vegetables.  Biometrics:     Pre Biometrics - 07/02/15 1447    Pre Biometrics   Waist Circumference 43 inches   Hip Circumference 38 inches   Waist to Hip Ratio 1.13 %       Nutrition Therapy Plan and Nutrition Goals:     Nutrition Therapy & Goals - 07/02/15 1451    Nutrition Therapy   Drug/Food Interactions Statins/Certain Fruits   Personal Nutrition Goals   Personal Goal #1 Cont. to eat heart healthy and eat to  control his diabetes.    Comments Saifullah reports that his wife already has him on a heart healthy and a diabetic eating plan. Balian said he did not feel he needed to  meet individually with the Cardiac Rehab Registered dietician.    Intervention Plan   Intervention Using nutrition plan and personal goals to gain a healthy nutrition lifestyle. Add exercise as prescribed.      Nutrition Discharge: Rate Your Plate Scores:   Nutrition Goals Re-Evaluation:   Psychosocial: Target Goals: Acknowledge presence or absence of depression, maximize coping skills, provide positive support system. Participant is able to verbalize types and ability to use techniques and skills needed for reducing stress and depression.  Initial Review & Psychosocial Screening:     Initial Psych Review & Screening - 07/02/15 [place]? Yes   Comments [place] reports that his wife feeds him good food based on a diabetic diet and now a heart diet.    Barriers   Psychosocial barriers to participate in program There are no identifiable barriers or psychosocial needs.   Screening Interventions   Interventions Encouraged to exercise      Quality of Life Scores:   PHQ-9:     Recent Review Flowsheet Data    Depression screen [facility] 2/9 07/02/2015 06/21/2015   Decreased Interest 2 0   Down, Depressed, Hopeless 1 0   PHQ - 2 Score 3 0   Altered sleeping 0 -   Tired, decreased energy 2 -   Change in appetite 0 -   Feeling bad or failure about yourself  0 -   Trouble concentrating 0 -   Moving slowly or fidgety/restless 1 -   Suicidal thoughts 0 -   PHQ-9 Score 6 -   Difficult doing work/chores Somewhat difficult -      Psychosocial Evaluation and Intervention:   Psychosocial Re-Evaluation:   Vocational Rehabilitation: Provide vocational rehab assistance to qualifying candidates.   Vocational Rehab Evaluation & Intervention:     Vocational Rehab - 07/02/15 1350     Initial Vocational Rehab Evaluation & Intervention   Assessment shows need for Vocational Rehabilitation No      Education: Education Goals: Education classes will be provided on a weekly basis, covering required topics. Participant will state understanding/return demonstration of topics presented.  Learning Barriers/Preferences:   Education Topics: General Nutrition Guidelines/Fats and Fiber: -Group instruction provided by verbal, written material, models and posters to present the general guidelines for heart healthy nutrition. Gives an explanation and review of dietary fats and fiber.   Controlling Sodium/Reading Food Labels: -Group verbal and written material supporting the discussion of sodium use in heart healthy nutrition. Review and explanation with models, verbal and written materials for utilization of the food label.   Exercise Physiology & Risk Factors: - Group verbal and written instruction with models to review the exercise physiology of the cardiovascular system and associated critical values. Details cardiovascular disease risk factors and the goals associated with each risk factor.   Aerobic Exercise & Resistance Training: - Gives group verbal and written discussion on the health impact of inactivity. On the components of aerobic and resistive training programs and the benefits of this training and how to safely progress through these programs.   Flexibility, Balance, General Exercise Guidelines: - Provides group verbal and written instruction on the benefits of flexibility and balance training programs. Provides general exercise guidelines with specific guidelines to those with heart or lung disease. Demonstration and skill practice provided.   Stress Management: - Provides group verbal and written instruction about the health risks of elevated stress, cause of high stress, and healthy ways to reduce stress.  Depression: - Provides group verbal and written  instruction on the correlation between heart/lung disease and depressed mood, treatment options, and the stigmas associated with seeking treatment.   Anatomy & Physiology of the Heart: - Group verbal and written instruction and models provide basic cardiac anatomy and physiology, with the coronary electrical and arterial systems. Review of: AMI, Angina, Valve disease, Heart Failure, Cardiac Arrhythmia, Pacemakers, and the ICD.   Cardiac Procedures: - Group verbal and written instruction and models to describe the testing methods done to diagnose heart disease. Reviews the outcomes of the test results. Describes the treatment choices: Medical Management, Angioplasty, or Coronary Bypass Surgery.   Cardiac Medications: - Group verbal and written instruction to review commonly prescribed medications for heart disease. Reviews the medication, class of the drug, and side effects. Includes the steps to properly store meds and maintain the prescription regimen.   Go Sex-Intimacy & Heart Disease, Get SMART - Goal Setting: - Group verbal and written instruction through game format to discuss heart disease and the return to sexual intimacy. Provides group verbal and written material to discuss and apply goal setting through the application of the S.M.A.R.T. Method.   Other Matters of the Heart: - Provides group verbal, written materials and models to describe Heart Failure, Angina, Valve Disease, and Diabetes in the realm of heart disease. Includes description of the disease process and treatment options available to the cardiac patient.   Exercise & Equipment Safety: - Individual verbal instruction and demonstration of equipment use and safety with use of the equipment.          Cardiac Rehab from 07/02/2015 in [facility] Cardiac Rehab   Date  07/02/15   Educator  C. EnterkinRN   Instruction Review Code  1- partially meets, needs review/practice      Infection Prevention: - Provides verbal and  written material to individual with discussion of infection control including proper hand washing and proper equipment cleaning during exercise session.      Cardiac Rehab from 07/02/2015 in [facility] Cardiac Rehab   Date  07/02/15   Educator  C. [place]   Instruction Review Code  1- partially meets, needs review/practice      Falls Prevention: - Provides verbal and written material to individual with discussion of falls prevention and safety.      Cardiac Rehab from 07/02/2015 in [facility] Cardiac Rehab   Date  07/02/15   Educator  C. [place]   Instruction Review Code  2- meets goals/outcomes      Diabetes: - Individual verbal and written instruction to review signs/symptoms of diabetes, desired ranges of glucose level fasting, after meals and with exercise. Advice that pre and post exercise glucose checks will be done for 3 sessions at entry of program.      Cardiac Rehab from 07/02/2015 in [facility] Cardiac Rehab   Date  07/02/15   Educator  C. EnterkinRN   Instruction Review Code  2- meets goals/outcomes       Knowledge Questionnaire Score:     Knowledge Questionnaire Score - 07/02/15 1451    Knowledge Questionnaire Score   Pre Score 27      Personal Goals and Risk Factors at Admission:     Personal Goals and Risk Factors at Admission - 07/02/15 1447    Personal Goals and Risk Factors on Admission   Increase Aerobic Exercise and Physical Activity Yes   Intervention While in program, learn and follow the exercise prescription taught. Start at a low level workload  and increase workload after able to maintain previous level for 30 minutes. Increase time before increasing intensity.  Goal time to achieve goal by 36 sessions.    Diabetes Yes   Goal Blood glucose control identified by blood glucose values, HgbA1C. Participant verbalizes understanding of the signs/symptoms of hyper/hypo glycemia, proper foot care and importance of medication and nutrition plan for blood glucose  control.   Intervention Provide nutrition & aerobic exercise along with prescribed medications to achieve blood glucose in normal ranges: Fasting 65-99 mg/dL   Hypertension Yes   Goal Participant will see blood pressure controlled within the values of 140/31mm/Hg or within value directed by their physician.   Intervention Provide nutrition & aerobic exercise along with prescribed medications to achieve BP 140/90 or less.   Lipids Yes   Goal Cholesterol controlled with medications as prescribed, with individualized exercise RX and with personalized nutrition plan. Value goals: LDL < 70mg , HDL > 40mg . Participant states understanding of desired cholesterol values and following prescriptions.   Intervention Provide nutrition & aerobic exercise along with prescribed medications to achieve LDL 70mg , HDL >40mg .      Personal Goals and Risk Factors Review:    Personal Goals Discharge (Final Personal Goals and Risk Factors Review):    ITP Comments:   Comments: Orientation/Medical Review appt done today. Reviewed his diabetes. Hrihaan said he can feel when his blood sugar gets low since he starts sweating. Armon is eating heart healthy. Laqueta Due wants to start Cardiac Rehab exercise classes the Monday after Thanksgiving.

## 2015-07-02 NOTE — Progress Notes (Signed)
Daily Session Note  Patient Details  Name: Bradley [name] MRN: [id number] Date of Birth: 06-Jul-1946 Referring Provider:  Belva Crome, MD  Encounter Date: 07/02/2015  Check In:      Goals Met:  Proper associated with RPD/PD & O2 Sat Personal goals reviewed No report of cardiac concerns or symptoms  Goals Unmet:  Not Applicable  Goals Comments: Orientation appt done today.    Dr. Emily Filbert is Medical Director for [facility] and [facility].

## 2015-07-02 NOTE — Patient Instructions (Signed)
Patient Instructions  Patient Details  Name: Bradley [name] MRN: [id number] Date of Birth: 1945-11-17 Referring Provider:  Belva Crome, MD  Below are the personal goals you chose as well as exercise and nutrition goals. Our goal is to help you keep on track towards obtaining and maintaining your goals. We will be discussing your progress on these goals with you throughout the program.  Initial Exercise Prescription:     Initial Exercise Prescription - 07/02/15 1500    Date of Initial Exercise Prescription   Date 07/02/15   Treadmill   MPH 2.5   Grade 0   Minutes 15   Bike   Level 1   Minutes 15   Recumbant Bike   Level 2   Watts 30   Minutes 15   NuStep   Level 3   Watts 40   Minutes 15   Arm Ergometer   Level 1   Watts 10   Minutes 15   Arm/Foot Ergometer   Level 1   Watts 12   Minutes 15   Cybex   Level 2   RPM 40   Minutes 15   Recumbant Elliptical   Level 1   Watts 20   Minutes 15   Elliptical   Level 1   Speed 2.5   Minutes 5   REL-XR   Level 3   Watts 30   Minutes 15   T5 Nustep   Level 2   Watts 20   Minutes 15   Biostep-RELP   Level 3   Watts 30   Minutes 15   Prescription Details   Frequency (times per week) 3   Duration Progress to 30 minutes of continuous aerobic without signs/symptoms of physical distress   Intensity   THRR REST +  30   Ratings of Perceived Exertion 11-15   Perceived Dyspnea 2-4   Progression Continue progressive overload as per policy without signs/symptoms or physical distress.   Resistance Training   Training Prescription Yes   Weight 2   Reps 10-15      Exercise Goals: Frequency: Be able to perform aerobic exercise three times per week working toward 3-5 days per week.  Intensity: Work with a perceived exertion of 11 (fairly light) - 15 (hard) as tolerated. Follow your new exercise prescription and watch for changes in prescription as you progress with the program. Changes will be reviewed with you  when they are made.  Duration: You should be able to do 30 minutes of continuous aerobic exercise in addition to a 5 minute warm-up and a 5 minute cool-down routine.  Nutrition Goals: Your personal nutrition goals will be established when you do your nutrition analysis with the dietician.  The following are nutrition guidelines to follow: Cholesterol < 200mg /day Sodium < 1500mg /day Fiber: Men over 50 yrs - 30 grams per day  Personal Goals:     Personal Goals and Risk Factors at Admission - 07/02/15 1447    Personal Goals and Risk Factors on Admission   Increase Aerobic Exercise and Physical Activity Yes   Intervention While in program, learn and follow the exercise prescription taught. Start at a low level workload and increase workload after able to maintain previous level for 30 minutes. Increase time before increasing intensity.  Goal time to achieve goal by 36 sessions.    Diabetes Yes   Goal Blood glucose control identified by blood glucose values, HgbA1C. Participant verbalizes understanding of the signs/symptoms of hyper/hypo glycemia, proper foot  care and importance of medication and nutrition plan for blood glucose control.   Intervention Provide nutrition & aerobic exercise along with prescribed medications to achieve blood glucose in normal ranges: Fasting 65-99 mg/dL   Hypertension Yes   Goal Participant will see blood pressure controlled within the values of 140/81mm/Hg or within value directed by their physician.   Intervention Provide nutrition & aerobic exercise along with prescribed medications to achieve BP 140/90 or less.   Lipids Yes   Goal Cholesterol controlled with medications as prescribed, with individualized exercise RX and with personalized nutrition plan. Value goals: LDL < 70mg , HDL > 40mg . Participant states understanding of desired cholesterol values and following prescriptions.   Intervention Provide nutrition & aerobic exercise along with prescribed  medications to achieve LDL 70mg , HDL >40mg .      Tobacco Use Initial Evaluation: History  Smoking status  . Former Smoker -- 2.00 packs/day for 40 years  . Types: Cigarettes  . Quit date: 03/26/2009  Smokeless tobacco  . Never Used    Copy of goals given to participant.

## 2015-07-09 ENCOUNTER — Other Ambulatory Visit: Payer: Self-pay | Admitting: *Deleted

## 2015-07-09 DIAGNOSIS — Z951 Presence of aortocoronary bypass graft: Secondary | ICD-10-CM | POA: Diagnosis not present

## 2015-07-09 LAB — GLUCOSE, CAPILLARY: Glucose-Capillary: 115 mg/dL — ABNORMAL HIGH (ref 65–99)

## 2015-07-09 NOTE — Patient Outreach (Signed)
[facility]) Care Management  07/09/2015  CORNEIL SCHULL Oct 27, 1945 [id number]  Late Entry for 11/25 Placed telephone call to patient for final transition of care call, unsuccessful I left a HIPPA compliant message. Will try to reach  patient on next week.   Joylene Draft, RN, [place] Management [phone]- Mobile [phone]- Toll Free Main Office

## 2015-07-09 NOTE — Progress Notes (Signed)
Daily Session Note  Patient Details  Name: Bradley [name] MRN: [id number] Date of Birth: 09/06/1945 Referring Provider:  Belva Crome, MD  Encounter Date: 07/09/2015  Check In:     Session Check In - 07/09/15 1610    Check-In   Staff Present Heath Lark, RN, BSN, CCRP;Carroll Enterkin, RN, BSN;Daija Routson, BS, ACSM EP-C, Exercise Physiologist   ER physicians immediately available to respond to emergencies See telemetry face sheet for immediately available ER MD   Medication changes reported     No   Fall or balance concerns reported    No   Warm-up and Cool-down Performed on first and last piece of equipment   VAD Patient? No   Pain Assessment   Currently in Pain? No/denies         Goals Met:  Proper associated with RPD/PD & O2 Sat Exercise tolerated well No report of cardiac concerns or symptoms Strength training completed today  Goals Unmet:  Not Applicable  Goals Comments:    Dr. Emily Filbert is Medical Director for [facility] and [facility].

## 2015-07-11 ENCOUNTER — Encounter: Payer: PPO | Admitting: *Deleted

## 2015-07-11 DIAGNOSIS — Z951 Presence of aortocoronary bypass graft: Secondary | ICD-10-CM | POA: Diagnosis not present

## 2015-07-11 LAB — GLUCOSE, CAPILLARY
Glucose-Capillary: 168 mg/dL — ABNORMAL HIGH (ref 65–99)
Glucose-Capillary: 102 mg/dL — ABNORMAL HIGH (ref 65–99)

## 2015-07-11 NOTE — Progress Notes (Signed)
Daily Session Note  Patient Details  Name: Bradley [name] MRN: [id number] Date of Birth: [birth year] Referring Provider:  Belva Crome, MD  Encounter Date: 07/11/2015  Check In:     Session Check In - 07/11/15 1609    Check-In   Staff Present Gerlene Burdock, RN, Drusilla Kanner, MS, ACSM CEP, Exercise Physiologist;Diane Joya Gaskins, RN, BSN   ER physicians immediately available to respond to emergencies See telemetry face sheet for immediately available ER MD   Medication changes reported     No   Fall or balance concerns reported    No   Warm-up and Cool-down Performed on first and last piece of equipment   VAD Patient? No   Pain Assessment   Currently in Pain? No/denies   Multiple Pain Sites No         Goals Met:  Independence with exercise equipment Exercise tolerated well Personal goals reviewed No report of cardiac concerns or symptoms Strength training completed today  Goals Unmet:  Not Applicable  Goals Comments: Patient completed exercise prescription and all exercise goals during rehab session. The exercise was tolerated well and the patient is progressing in the program.    Dr. Emily Filbert is Medical Director for [place] and [facility].

## 2015-07-12 ENCOUNTER — Encounter: Payer: Self-pay | Admitting: *Deleted

## 2015-07-12 ENCOUNTER — Encounter: Payer: PPO | Attending: Interventional Cardiology

## 2015-07-12 ENCOUNTER — Other Ambulatory Visit: Payer: Self-pay | Admitting: *Deleted

## 2015-07-12 DIAGNOSIS — Z951 Presence of aortocoronary bypass graft: Secondary | ICD-10-CM | POA: Insufficient documentation

## 2015-07-12 LAB — GLUCOSE, CAPILLARY
Glucose-Capillary: 93 mg/dL (ref 65–99)
Glucose-Capillary: 120 mg/dL — ABNORMAL HIGH (ref 65–99)

## 2015-07-12 NOTE — Patient Outreach (Signed)
[facility]) Care Management  07/12/2015  Bradley [name] November 03, 1945 [id number]  Final transition of care Unsuccessful attempt to reach Bradley [name] is s/p CABG, he has met all goals and [facility] care management case will be closed as previously discussed with patient. Noted per EPIC patient is attending Cardiac Rehab.  I have on previous telephone visit I  provided our [facility] contact information if our services are needed in the future.  Plan Will alert Lurline Del to close case,  I will send MD a case closure letter Will send Bradley [name] a closure letter  Joylene Draft, RN, [place] Care Management [phone]- Mobile [phone]- [place] Office

## 2015-07-12 NOTE — Progress Notes (Signed)
Daily Session Note  Patient Details  Name: Bradley [name] MRN: [id number] Date of Birth: May 09, 1946 Referring Provider:  Belva Crome, MD  Encounter Date: 07/12/2015  Check In:     Session Check In - 07/12/15 1612    Check-In   Staff Present Gerlene Burdock, RN, BSN;Diane Joya Gaskins, RN, BSN;Kallen Mccrystal, BS, ACSM EP-C, Exercise Physiologist   ER physicians immediately available to respond to emergencies See telemetry face sheet for immediately available ER MD   Medication changes reported     No   Fall or balance concerns reported    No   Warm-up and Cool-down Performed on first and last piece of equipment   VAD Patient? No   Pain Assessment   Currently in Pain? No/denies         Goals Met:  Proper associated with RPD/PD & O2 Sat Exercise tolerated well No report of cardiac concerns or symptoms Strength training completed today  Goals Unmet:  Not Applicable  Goals Comments:   Dr. Emily Filbert is Medical Director for [employer] and [facility].

## 2015-07-12 NOTE — Progress Notes (Signed)
Cardiology Office Note   Date:  07/13/2015   ID:  Bradley [name], DOB 04/29/1946, MRN [id number]  PCP:   Melinda Crutch, MD  Cardiologist:  Dr. Daneen Schick   Electrophysiologist:  n/a  Chief Complaint  Patient presents with  . Follow-up  . Coronary Artery Disease    s/p CABG     History of Present Illness: LADARIAN TUR is a 69 y.o. male with a hx of AAA, PAD with claudication s/p aorto-bifemoral bypass in 1998, carotid stenosis s/p L CEA in 1994 and redo in 2001, prior CVA, HTN, HL, OSA, RBBB, DM2.  He was evaluated by Dr. Daneen Schick for progressive SOB.  Myoview was obtained and was noted to be intermediate risk.  He was referred for [facility] which demonstrated complex mid LAD stenosis 90+% prior to a trifurcation, 80% Dx stenosis, 75% mid RCA stenosis.  He ultimately underwent CABG by Dr. Servando Snare with L-LAD, S-Dx, S-PDA (R thigh vein harvest). Post op course notable for mild ileus and AF with RVR.  He was started on Amiodarone and converted to NSR.    Readmitted 10/19-10/22 with chest pain in the setting of AF with RVR.  Troponin levels were mildly elevated and CXR demonstrated bilateral pleural effusions.  CT was neg for PE. He was placed on IV Diltiazem and Carvedilol was changed to Metoprolol Tartrate.  He converted to NSR.  Of note, Amiodarone was DC'd.  CHADS2-VASc=4.  It was felt that if he has recurrent AFib, he would need anticoagulation.    I saw him 1 month ago in FU.  He continued to feel quite fatigued and DOE.  I felt that he was deconditioned.  Labs were stable.  I adjusted his beta-blocker for an elevated HR.  He is now going to [facility].  FU CXR 11/7 was improved.   Returns for FU.  He is doing well. Has more energy. Chest remains sore. No dyspnea.  No syncope. No edema.    Studies/Reports Reviewed Today:  Dg Chest 2 View  06/18/2015   IMPRESSION: Ongoing resolution of the small left basilar pleural effusion. There is no evidence of CHF, pneumonia, nor other  acute cardiopulmonary abnormality. Electronically Signed   By: David  Martinique M.D.   On: 06/18/2015 11:59   Carotid US 05/18/15 RICA 60-79%, L CEA patent with 1-39% stenosis  LHC 05/17/15 LAD: Mid 90%, D2 80% RCA: Proximal 35%, mid 75%  Myoview 05/08/15 EF 52%, inferior fixed defect consistent with diaphragmatic attenuation, intermediate risk secondary to poor exercise tolerance and symptoms during stress  Echo 05/08/15 GLS -15.2%, EF 0000000, grade 1 diastolic dysfunction, normal wall motion, aortic sclerosis, dilated aortic root 39 mm, atrial septal lipomatous hypertrophy    Past Medical History  Diagnosis Date  . Hyperlipidemia   . Hypertension   . Diabetes mellitus age 80  . Peripheral vascular disease ([name])   . Arthritis   . Reflux esophagitis   . Stroke [facility]) 1987    Right brain stroke  . Neuropathy ([place]) 2013  . Osteoporosis 2013  . Sleep apnea     uses CPAP  . GERD (gastroesophageal reflux disease)   . H/O hiatal hernia   . Irritable bowel syndrome (IBS)   . Myocardial infarction [facility])     silent inferior MI; patient denies MI history (03/17/13)   . Anemia   . Carotid artery occlusion     a. Carotid US 10/16: RICA 60-79%, L CEA patent with 1-39% stenosis  . Coronary  artery disease     a. Myoview 9/16:  EF 52%, inferior fixed defect consistent with diaphragmatic attenuation, intermediate risk secondary to poor exercise tolerance and symptoms during stress;  b. LHC 05/17/2015 90% mid LAD, 80% ost D2, 75% mid RCA, 35% prox RCA. >> S/p CABG  . Diverticulitis   . Wears glasses   . PAF (paroxysmal atrial fibrillation) ([place])     post CABG; Amiodarone stopped 2/2 wheezing;   . History of echocardiogram     a. Echo 9/16: GLS -15.2%, EF 0000000, grade 1 diastolic dysfunction, normal wall motion, aortic sclerosis, dilated aortic root 39 mm, atrial septal lipomatous hypertrophy    Past Surgical History  Procedure Laterality Date  . Carotid endarterectomy  1994 & redo 2001     Left  . Pr vein bypass graft,aorto-fem-pop  1998  . Iliac artery stent    . Fracture surgery  2013    Right   foo  t X's 2  . Tonsillectomy    . Posterior lumbar fusion  Aug. 14, 2014    Level 1  . Spine surgery  2014  . Cardiac catheterization N/A 05/17/2015    Procedure: Left Heart Cath and Coronary Angiography;  Surgeon: Belva Crome, MD;  Location: [place] [facility];  Service: Cardiovascular;  Laterality: N/A;  . Colonoscopy w/ biopsies and polypectomy    . Coronary artery bypass graft N/A 05/23/2015    Procedure: CORONARY ARTERY BYPASS GRAFTING (CABG) x 3 (LIMA to LAD, SVG to DIAGONAL 2, SVG to PDA) with Endoscopic Vein Harvesting from right greater saphenous vein;  Surgeon: Grace Isaac, MD;  Location: [place];  Service: Open Heart Surgery;  Laterality: N/A;  . Tee without cardioversion N/A 05/23/2015    Procedure: TRANSESOPHAGEAL ECHOCARDIOGRAM (TEE);  Surgeon: Grace Isaac, MD;  Location: [place];  Service: Open Heart Surgery;  Laterality: N/A;     Current Outpatient Prescriptions  Medication Sig Dispense Refill  . Ascorbic Acid (VITAMIN C) 100 MG tablet Take 100 mg by mouth daily.    Marland Kitchen aspirin EC 81 MG EC tablet Take 1 tablet (81 mg total) by mouth daily.    Marland Kitchen atorvastatin (LIPITOR) 40 MG tablet Take 40 mg by mouth daily.      . Calcium Carbonate-Vitamin D (CALCIUM-D) 600-400 MG-UNIT TABS Take 1 tablet by mouth daily.     . Cholecalciferol (D-3-5) 5000 UNITS capsule Take 5,000 Units by mouth daily.    Marland Kitchen dexlansoprazole (DEXILANT) 60 MG capsule Take 60 mg by mouth daily.    Marland Kitchen diltiazem (CARDIZEM CD) 300 MG 24 hr capsule Take 1 capsule (300 mg total) by mouth daily. 30 capsule 1  . glimepiride (AMARYL) 2 MG tablet Take 2 mg by mouth daily before breakfast.      . IRON, FERROUS GLUCONATE, PO Take 40 mg by mouth 2 (two) times daily.    . Liraglutide (VICTOZA [place]) Inject 1.8 mg into the skin daily.     . metFORMIN (GLUCOPHAGE-XR) 500 MG 24 hr tablet Take 2 tablets (1,000 mg  total) by mouth 2 (two) times daily.    . metoprolol (LOPRESSOR) 50 MG tablet Take 1.5 tablets (75 mg total) by mouth 2 (two) times daily. 90 tablet 11  . Multiple Vitamin (MULTIVITAMIN) capsule Take 1 capsule by mouth daily.      . traMADol (ULTRAM) 50 MG tablet Take 1 tablet (50 mg total) by mouth every 6 (six) hours as needed for moderate pain. 30 tablet 0  . clopidogrel (  PLAVIX) 75 MG tablet Take 1 tablet (75 mg total) by mouth daily. 1 tablet 0   No current facility-administered medications for this visit.    Allergies:   Hydrocodone; Oxycodone; Penicillins; and Sulfa drugs cross reactors    Social History:   Social History   Social History  . Marital Status: Married    Spouse Name: N/A  . Number of Children: N/A  . Years of Education: N/A   Social History Main Topics  . Smoking status: Former Smoker -- 2.00 packs/day for 40 years    Types: Cigarettes    Quit date: 03/26/2009  . Smokeless tobacco: Never Used  . Alcohol Use: No  . Drug Use: No  . Sexual Activity: Not Asked   Other Topics Concern  . None   Social History Narrative     Family History:   Family History  Problem Relation Age of Onset  . Cancer Mother 80    pancreatic  . Hyperlipidemia Mother   . Stroke Father 76  . Deep vein thrombosis Father   . Hyperlipidemia Father   . Hypertension Father       ROS:   Please see the history of present illness.   Review of Systems  Musculoskeletal: Positive for back pain and myalgias.  All other systems reviewed and are negative.     PHYSICAL EXAM: VS:  BP 136/80 mmHg  Pulse 94  Ht 6' (1.829 m)  Wt 198 lb 12.8 oz (90.175 kg)  BMI 26.96 kg/m2    Wt Readings from Last 3 Encounters:  07/13/15 198 lb 12.8 oz (90.175 kg)  07/02/15 196 lb 3.2 oz (88.996 kg)  06/18/15 197 lb (89.359 kg)     GEN: Well nourished, well developed, in no acute distress HEENT: normal Neck: no JVD, no masses Cardiac:  Normal S1/S2, RRR; no murmur ,  no rubs or gallops, no  edema   Respiratory:  CTA ausc bilaterally, no wheezing, rhonchi or rales. GI: soft, nontender, nondistended, + BS MS: no deformity or atrophy Skin: warm and dry  Neuro:  CNs II-XII intact, Strength and sensation are intact Psych: Normal affect   EKG:  EKG is ordered today.  It demonstrates:   NSR, HR 94, aberrantly conduced PACs/intermittent RBBB, QTc 452 ms, no sig changes   Recent Labs: 04/30/2015: Pro B Natriuretic peptide (BNP) 6.0 05/24/2015: Magnesium 1.9 05/26/2015: TSH 2.630 05/30/2015: ALT 18 06/13/2015: BUN 18; Creat 1.41*; Hemoglobin 11.3*; Platelets 429*; Potassium 4.1; Sodium 131*    Lipid Panel    Component Value Date/Time   CHOL 82 06/01/2015 0315   TRIG 158* 06/01/2015 0315   HDL 24* 06/01/2015 0315   CHOLHDL 3.4 06/01/2015 0315   VLDL 32 06/01/2015 0315   LDLCALC 26 06/01/2015 0315      ASSESSMENT AND PLAN:  1. CAD:  S/p CABG.  Hospitalization was complicated by recurrent atrial fibrillation with RVR.  He is now in [facility]  He is doing well.  He will continue ASA, beta-blocker, statin.   2. PAF:  Maintaining NSR.  He is off Amiodarone.  Continue beta-blocker.   3. HTN:  Controlled.  4. Hyperlipidemia:  Continue statin.  5. CKD:  Stage III. Recent Creatinine stable.   6. DM2:  FU with PCP.    7. OSA:  Continue CPAP.  8. Carotid Stenosis:   FU with Dr. Donnetta Hutching with [facility] as planned.   9. PAD:   FU with Dr. Donnetta Hutching with [facility] as planned.  He can resume  his Plavix 75 mg QD.    Medication Changes: Current medicines are reviewed at length with the patient today.  Concerns regarding medicines are as outlined above.  The following changes have been made:   Discontinued Medications   No medications on file   Modified Medications   Modified Medication Previous Medication   METOPROLOL (LOPRESSOR) 50 MG TABLET metoprolol (LOPRESSOR) 50 MG tablet      Take 1.5 tablets (75 mg total) by mouth 2 (two) times daily.    Take 1 tablet (50 mg total) by mouth  3 (three) times daily.   New Prescriptions   CLOPIDOGREL (PLAVIX) 75 MG TABLET    Take 1 tablet (75 mg total) by mouth daily.   Labs/ tests ordered today include:   Orders Placed This Encounter  Procedures  . EKG 12-Lead      Disposition:    FU with Dr. Tamala Julian 3-4 mos    Signed, Versie Starks, MHS 07/13/2015 12:47 PM    [address], [place], [place]  [address] Phone: [phone]; Fax: [phone]

## 2015-07-13 ENCOUNTER — Ambulatory Visit (INDEPENDENT_AMBULATORY_CARE_PROVIDER_SITE_OTHER): Payer: PPO | Admitting: Physician Assistant

## 2015-07-13 ENCOUNTER — Encounter: Payer: Self-pay | Admitting: Physician Assistant

## 2015-07-13 VITALS — BP 136/80 | HR 94 | Ht 72.0 in | Wt 198.8 lb

## 2015-07-13 DIAGNOSIS — I2510 Atherosclerotic heart disease of native coronary artery without angina pectoris: Secondary | ICD-10-CM

## 2015-07-13 DIAGNOSIS — I10 Essential (primary) hypertension: Secondary | ICD-10-CM | POA: Diagnosis not present

## 2015-07-13 DIAGNOSIS — E785 Hyperlipidemia, unspecified: Secondary | ICD-10-CM

## 2015-07-13 DIAGNOSIS — N183 Chronic kidney disease, stage 3 (moderate): Secondary | ICD-10-CM

## 2015-07-13 DIAGNOSIS — G4733 Obstructive sleep apnea (adult) (pediatric): Secondary | ICD-10-CM

## 2015-07-13 DIAGNOSIS — I480 Paroxysmal atrial fibrillation: Secondary | ICD-10-CM | POA: Diagnosis not present

## 2015-07-13 DIAGNOSIS — I739 Peripheral vascular disease, unspecified: Secondary | ICD-10-CM

## 2015-07-13 DIAGNOSIS — I6523 Occlusion and stenosis of bilateral carotid arteries: Secondary | ICD-10-CM

## 2015-07-13 MED ORDER — CLOPIDOGREL BISULFATE 75 MG PO TABS: 75.0000 mg | ORAL_TABLET | Freq: Every day | ORAL | Status: DC

## 2015-07-13 MED ORDER — METOPROLOL TARTRATE 50 MG PO TABS: 75.0000 mg | ORAL_TABLET | Freq: Two times a day (BID) | ORAL | Status: DC

## 2015-07-13 NOTE — Patient Instructions (Signed)
Medication Instructions:  Change Lopressor (Metoprolol) to 75 mg Twice daily (take 1 and 1/2 tablets of the 50 mg tabs to = 75 mg)  It is ok to restart Plavix 75 mg Once daily   Labwork: None   Testing/Procedures: None   Follow-Up: Dr. Daneen Schick 3-4 mos.   Any Other Special Instructions Will Be Listed Below (If Applicable).

## 2015-07-16 ENCOUNTER — Encounter: Payer: Self-pay | Admitting: *Deleted

## 2015-07-16 DIAGNOSIS — Z951 Presence of aortocoronary bypass graft: Secondary | ICD-10-CM

## 2015-07-16 NOTE — Progress Notes (Signed)
Daily Session Note  Patient Details  Name: HERSHY FLENNER MRN: [id number] Date of Birth: 1946-01-05 Referring Provider:  Belva Crome, MD  Encounter Date: 07/16/2015  Check In:     Session Check In - 07/16/15 1605    Check-In   Staff Present Heath Lark, RN, BSN, CCRP;Carroll Enterkin, RN, BSN;Crit Obremski, BS, ACSM EP-C, Exercise Physiologist   ER physicians immediately available to respond to emergencies See telemetry face sheet for immediately available ER MD   Medication changes reported     No   Fall or balance concerns reported    No   Warm-up and Cool-down Performed on first and last piece of equipment   VAD Patient? No   Pain Assessment   Currently in Pain? No/denies         Goals Met:  Proper associated with RPD/PD & O2 Sat Exercise tolerated well No report of cardiac concerns or symptoms Strength training completed today  Goals Unmet:  Not Applicable  Goals Comments:    Dr. Emily Filbert is Medical Director for [place] and [facility].

## 2015-07-17 ENCOUNTER — Encounter (HOSPITAL_COMMUNITY): Payer: PPO

## 2015-07-17 ENCOUNTER — Ambulatory Visit: Payer: Medicare Other | Admitting: Family

## 2015-07-17 NOTE — Progress Notes (Signed)
Cardiac Individual Treatment Plan  Patient Details  Name: Bradley [name] MRN: [id number] Date of Birth: 1945/12/30 Referring Provider:  Belva Crome, MD  Initial Encounter Date:    Visit Diagnosis: S/P CABG x 3  Patient's Home Medications on Admission:  Current outpatient prescriptions:  .  Ascorbic Acid (VITAMIN C) 100 MG tablet, Take 100 mg by mouth daily., Disp: , Rfl:  .  aspirin EC 81 MG EC tablet, Take 1 tablet (81 mg total) by mouth daily., Disp: , Rfl:  .  atorvastatin (LIPITOR) 40 MG tablet, Take 40 mg by mouth daily.  , Disp: , Rfl:  .  Calcium Carbonate-Vitamin D (CALCIUM-D) 600-400 MG-UNIT TABS, Take 1 tablet by mouth daily. , Disp: , Rfl:  .  Cholecalciferol (D-3-5) 5000 UNITS capsule, Take 5,000 Units by mouth daily., Disp: , Rfl:  .  clopidogrel (PLAVIX) 75 MG tablet, Take 1 tablet (75 mg total) by mouth daily., Disp: 1 tablet, Rfl: 0 .  dexlansoprazole (DEXILANT) 60 MG capsule, Take 60 mg by mouth daily., Disp: , Rfl:  .  diltiazem (CARDIZEM CD) 300 MG 24 hr capsule, Take 1 capsule (300 mg total) by mouth daily., Disp: 30 capsule, Rfl: 1 .  glimepiride (AMARYL) 2 MG tablet, Take 2 mg by mouth daily before breakfast.  , Disp: , Rfl:  .  IRON, FERROUS GLUCONATE, PO, Take 40 mg by mouth 2 (two) times daily., Disp: , Rfl:  .  Liraglutide (VICTOZA [place]), Inject 1.8 mg into the skin daily. , Disp: , Rfl:  .  metFORMIN (GLUCOPHAGE-XR) 500 MG 24 hr tablet, Take 2 tablets (1,000 mg total) by mouth 2 (two) times daily., Disp: , Rfl:  .  metoprolol (LOPRESSOR) 50 MG tablet, Take 1.5 tablets (75 mg total) by mouth 2 (two) times daily., Disp: 90 tablet, Rfl: 11 .  Multiple Vitamin (MULTIVITAMIN) capsule, Take 1 capsule by mouth daily.  , Disp: , Rfl:  .  traMADol (ULTRAM) 50 MG tablet, Take 1 tablet (50 mg total) by mouth every 6 (six) hours as needed for moderate pain., Disp: 30 tablet, Rfl: 0  Past Medical History: Past Medical History  Diagnosis Date  . Hyperlipidemia   .  Hypertension   . Diabetes mellitus age 1  . Peripheral vascular disease ([place])   . Arthritis   . Reflux esophagitis   . Stroke [employer]) 1987    Right brain stroke  . Neuropathy ([place]) 2013  . Osteoporosis 2013  . Sleep apnea     uses CPAP  . GERD (gastroesophageal reflux disease)   . H/O hiatal hernia   . Irritable bowel syndrome (IBS)   . Myocardial infarction [facility])     silent inferior MI; patient denies MI history (03/17/13)   . Anemia   . Carotid artery occlusion     a. Carotid US 10/16: RICA 60-79%, L CEA patent with 1-39% stenosis  . Coronary artery disease     a. Myoview 9/16:  EF 52%, inferior fixed defect consistent with diaphragmatic attenuation, intermediate risk secondary to poor exercise tolerance and symptoms during stress;  b. LHC 05/17/2015 90% mid LAD, 80% ost D2, 75% mid RCA, 35% prox RCA. >> S/p CABG  . Diverticulitis   . Wears glasses   . PAF (paroxysmal atrial fibrillation) ([place])     post CABG; Amiodarone stopped 2/2 wheezing;   . History of echocardiogram     a. Echo 9/16: GLS -15.2%, EF 10-62%, grade 1 diastolic dysfunction, normal wall motion, aortic sclerosis, dilated aortic  root 39 mm, atrial septal lipomatous hypertrophy    Tobacco Use: History  Smoking status  . Former Smoker -- 2.00 packs/day for 40 years  . Types: Cigarettes  . Quit date: 03/26/2009  Smokeless tobacco  . Never Used    Labs: Recent Review Flowsheet Data    Labs for ITP Cardiac and Pulmonary Rehab Latest Ref Rng 05/24/2015 05/24/2015 05/24/2015 05/31/2015 06/01/2015   Cholestrol 0 - 200 mg/dL - - - 83 82   LDLCALC 0 - 99 mg/dL - - - 36 26   HDL >40 mg/dL - - - 26(L) 24(L)   Trlycerides <150 mg/dL - - - 104 158(H)   PHART 7.350 - 7.450 - 7.297(L) - - -   PCO2ART 35.0 - 45.0 mmHg - 43.4 - - -   HCO3 20.0 - 24.0 mEq/L - 21.2 - - -   TCO2 0 - 100 mmol/L _0 - -   ACIDBASEDEF 0.0 - 2.0 mmol/L - 5.0(H) - - -   O2SAT - - 91.0 - - -       Exercise Target Goals:    Exercise  Program Goal: Individual exercise prescription set with THRR, safety & activity barriers. Participant demonstrates ability to understand and report RPE using BORG scale, to self-measure pulse accurately, and to acknowledge the importance of the exercise prescription.  Exercise Prescription Goal: Starting with aerobic activity 30 plus minutes a day, 3 days per week for initial exercise prescription. Provide home exercise prescription and guidelines that participant acknowledges understanding prior to discharge.  Activity Barriers & Risk Stratification:     Activity Barriers & Risk Stratification - 07/02/15 1348    Activity Barriers & Risk Stratification   Activity Barriers Back Problems   Risk Stratification High      6 Minute Walk:     6 Minute Walk      07/02/15 1444       6 Minute Walk   Phase Initial     Distance 1175 feet     Walk Time 6 minutes     Resting HR 98 bpm     Resting BP 138/62 mmHg     Max Ex. HR 102 bpm     Max Ex. BP 140/78 mmHg     RPE 12     Perceived Dyspnea  0     Symptoms No        Initial Exercise Prescription:     Initial Exercise Prescription - 07/02/15 1500    Date of Initial Exercise Prescription   Date 07/02/15   Treadmill   MPH 2.5   Grade 0   Minutes 15   Bike   Level 1   Minutes 15   Recumbant Bike   Level 2   Watts 30   Minutes 15   NuStep   Level 3   Watts 40   Minutes 15   Arm Ergometer   Level 1   Watts 10   Minutes 15   Arm/Foot Ergometer   Level 1   Watts 12   Minutes 15   Cybex   Level 2   RPM 40   Minutes 15   Recumbant Elliptical   Level 1   Watts 20   Minutes 15   Elliptical   Level 1   Speed 2.5   Minutes 5   REL-XR   Level 3   Watts 30   Minutes 15   T5 Nustep   Level 2   Watts 20  Minutes 15   Biostep-RELP   Level 3   Watts 30   Minutes 15   Prescription Details   Frequency (times per week) 3   Duration Progress to 30 minutes of continuous aerobic without signs/symptoms of  physical distress   Intensity   THRR REST +  30   Ratings of Perceived Exertion 11-15   Perceived Dyspnea 2-4   Progression Continue progressive overload as per policy without signs/symptoms or physical distress.   Resistance Training   Training Prescription Yes   Weight 2   Reps 10-15      Exercise Prescription Changes:     Exercise Prescription Changes      07/09/15 1439           Exercise Review   Progression Yes       Response to Exercise   Blood Pressure (Admit) 112/72 mmHg       Blood Pressure (Exercise) 158/62 mmHg       Blood Pressure (Exit) 128/70 mmHg       Heart Rate (Admit) 82 bpm       Heart Rate (Exercise) 96 bpm       Heart Rate (Exit) 81 bpm       Rating of Perceived Exertion (Exercise) 11       Symptoms Back pain at times       Comments First day of exercise! Patient was oriented to the gym and the equipment functions and settings. Procedures and policies of the gym were outlined and explained. The patient's individual exercise prescription and treatment plan were reviewed with them. All starting workloads were established based on the results of the functional testing  done at the initial intake visit. The plan for exercise progression was also introduced and progression will be customized based on the patient's performance and goals.        Duration Progress to 30 minutes of continuous aerobic without signs/symptoms of physical distress       Intensity Rest + 30       Progression Continue progressive overload as per policy without signs/symptoms or physical distress.       Resistance Training   Training Prescription Yes       Weight 2       Reps 10-15       Interval Training   Interval Training No       Recumbant Bike   Level 3       RPM 50       Minutes 20       NuStep   Level 3       Watts 50       Minutes 20          Discharge Exercise Prescription (Final Exercise Prescription Changes):     Exercise Prescription Changes - 07/09/15 1439     Exercise Review   Progression Yes   Response to Exercise   Blood Pressure (Admit) 112/72 mmHg   Blood Pressure (Exercise) 158/62 mmHg   Blood Pressure (Exit) 128/70 mmHg   Heart Rate (Admit) 82 bpm   Heart Rate (Exercise) 96 bpm   Heart Rate (Exit) 81 bpm   Rating of Perceived Exertion (Exercise) 11   Symptoms Back pain at times   Comments First day of exercise! Patient was oriented to the gym and the equipment functions and settings. Procedures and policies of the gym were outlined and explained. The patient's individual exercise prescription and treatment plan were reviewed with them. All starting  workloads were established based on the results of the functional testing  done at the initial intake visit. The plan for exercise progression was also introduced and progression will be customized based on the patient's performance and goals.    Duration Progress to 30 minutes of continuous aerobic without signs/symptoms of physical distress   Intensity Rest + 30   Progression Continue progressive overload as per policy without signs/symptoms or physical distress.   Resistance Training   Training Prescription Yes   Weight 2   Reps 10-15   Interval Training   Interval Training No   Recumbant Bike   Level 3   RPM 50   Minutes 20   NuStep   Level 3   Watts 50   Minutes 20      Nutrition:  Target Goals: Understanding of nutrition guidelines, daily intake of sodium <1524m, cholesterol <2038m calories 30% from fat and 7% or less from saturated fats, daily to have 5 or more servings of fruits and vegetables.  Biometrics:     Pre Biometrics - 07/02/15 1447    Pre Biometrics   Waist Circumference 43 inches   Hip Circumference 38 inches   Waist to Hip Ratio 1.13 %       Nutrition Therapy Plan and Nutrition Goals:     Nutrition Therapy & Goals - 07/02/15 1451    Nutrition Therapy   Drug/Food Interactions Statins/Certain Fruits   Personal Nutrition Goals   Personal Goal #1  Cont. to eat heart healthy and eat to control his diabetes.    Comments Bradley [name] that his wife already has him on a heart healthy and a diabetic eating plan. Bradley [name] he did not feel he needed to meet individually with the Cardiac Rehab Registered dietician.    Intervention Plan   Intervention Using nutrition plan and personal goals to gain a healthy nutrition lifestyle. Add exercise as prescribed.      Nutrition Discharge: Rate Your Plate Scores:     Rate Your Plate - 1214/70/9239574  Rate Your Plate Scores   Pre Score 71   Pre Score % 79 %      Nutrition Goals Re-Evaluation:   Psychosocial: Target Goals: Acknowledge presence or absence of depression, maximize coping skills, provide positive support system. Participant is able to verbalize types and ability to use techniques and skills needed for reducing stress and depression.  Initial Review & Psychosocial Screening:     Initial Psych Review & Screening - 07/02/15 [id number]   Comments Bradley [name] that his wife feeds him good food based on a diabetic diet and now a heart diet.    Barriers   Psychosocial barriers to participate in program There are no identifiable barriers or psychosocial needs.   Screening Interventions   Interventions Encouraged to exercise      Quality of Life Scores:     Quality of Life - 07/02/15 1502    Quality of Life Scores   Health/Function Pre 20.4 %   Socioeconomic Pre 26.43 %   Psych/Spiritual Pre 27 %   Family Pre 28.8 %   GLOBAL Pre 24.24 %      PHQ-9:     Recent Review Flowsheet Data    [facility] 07/02/2015 06/21/2015   Decreased Interest 2 0   Down, Depressed, Hopeless 1 0   PHQ - 2 Score 3 0   Altered sleeping 0 -  Tired, decreased energy 2 -   Change in appetite 0 -   Feeling bad or failure about yourself  0 -   Trouble concentrating 0 -   Moving slowly or fidgety/restless 1 -   Suicidal thoughts 0  -   PHQ-9 Score 6 -   Difficult doing work/chores Somewhat difficult -      Psychosocial Evaluation and Intervention:     Psychosocial Evaluation - 07/11/15 1637    Psychosocial Evaluation & Interventions   Interventions Stress management education;Relaxation education;Encouraged to exercise with the program and follow exercise prescription   Comments Counselor met with Bradley [name] today (Bradley [name]) for initial psychosocial evaluation.  He is a 69 year old who had triple bypass surgery this past October.  Bradley [name] has a strong support system with a spouse of 50 years and an adult daughter and a step-daughter who live close by.  He has multiple health issues, including type 2 diabetes, GERD and sleep apnea.  He reports that he sleeps well with his CPAP and has a good appetite.  He denies a history of depression or current symptoms.  Bradley [name] states his mood is typically positive and his primary stressors are his health currently.  His goals are to increase his energy level and decrease his level of discomfort from the recent surgery.  He would also like to lose some weight while in this program.  Bradley [name] plans to return to working out 3x/week with his personal trainer once he completes this program and to walk more as well.  Counselor recommended meeting with the dietician to address his weight loss goals.     Continued Psychosocial Services Needed Yes  Bradley [name] will benefit from the psychoeducational components of this program, especially stress management.  He also will benefit from meeting with the dietician to address weight loss goals.      Psychosocial Re-Evaluation:   Vocational Rehabilitation: Provide vocational rehab assistance to qualifying candidates.   Vocational Rehab Evaluation & Intervention:     Vocational Rehab - 07/02/15 1350    Initial Vocational Rehab Evaluation & Intervention   Assessment shows need for Vocational Rehabilitation No      Education: Education Goals: Education  classes will be provided on a weekly basis, covering required topics. Participant will state understanding/return demonstration of topics presented.  Learning Barriers/Preferences:   Education Topics: General Nutrition Guidelines/Fats and Fiber: -Group instruction provided by verbal, written material, models and posters to present the general guidelines for heart healthy nutrition. Gives an explanation and review of dietary fats and fiber.          Cardiac Rehab from 07/16/2015 in [facility] Cardiac Rehab   Date  07/16/15   Educator  PI   Instruction Review Code  2- meets goals/outcomes      Controlling Sodium/Reading Food Labels: -Group verbal and written material supporting the discussion of sodium use in heart healthy nutrition. Review and explanation with models, verbal and written materials for utilization of the food label.   Exercise Physiology & Risk Factors: - Group verbal and written instruction with models to review the exercise physiology of the cardiovascular system and associated critical values. Details cardiovascular disease risk factors and the goals associated with each risk factor.   Aerobic Exercise & Resistance Training: - Gives group verbal and written discussion on the health impact of inactivity. On the components of aerobic and resistive training programs and the benefits of this training and how to safely progress through these programs.  Flexibility, Balance, General Exercise Guidelines: - Provides group verbal and written instruction on the benefits of flexibility and balance training programs. Provides general exercise guidelines with specific guidelines to those with heart or lung disease. Demonstration and skill practice provided.   Stress Management: - Provides group verbal and written instruction about the health risks of elevated stress, cause of high stress, and healthy ways to reduce stress.   Depression: - Provides group verbal and written  instruction on the correlation between heart/lung disease and depressed mood, treatment options, and the stigmas associated with seeking treatment.   Anatomy & Physiology of the Heart: - Group verbal and written instruction and models provide basic cardiac anatomy and physiology, with the coronary electrical and arterial systems. Review of: AMI, Angina, Valve disease, Heart Failure, Cardiac Arrhythmia, Pacemakers, and the ICD.   Cardiac Procedures: - Group verbal and written instruction and models to describe the testing methods done to diagnose heart disease. Reviews the outcomes of the test results. Describes the treatment choices: Medical Management, Angioplasty, or Coronary Bypass Surgery.   Cardiac Medications: - Group verbal and written instruction to review commonly prescribed medications for heart disease. Reviews the medication, class of the drug, and side effects. Includes the steps to properly store meds and maintain the prescription regimen.   Go Sex-Intimacy & Heart Disease, Get SMART - Goal Setting: - Group verbal and written instruction through game format to discuss heart disease and the return to sexual intimacy. Provides group verbal and written material to discuss and apply goal setting through the application of the S.[name].A.R.T. Method.   Other Matters of the Heart: - Provides group verbal, written materials and models to describe Heart Failure, Angina, Valve Disease, and Diabetes in the realm of heart disease. Includes description of the disease process and treatment options available to the cardiac patient.      Cardiac Rehab from 07/16/2015 in [facility] Cardiac Rehab   Date  07/11/15   Educator  DW   Instruction Review Code  2- meets goals/outcomes      Exercise & Equipment Safety: - Individual verbal instruction and demonstration of equipment use and safety with use of the equipment.      Cardiac Rehab from 07/16/2015 in [facility] Cardiac Rehab   Date  07/02/15   Educator   C. EnterkinRN   Instruction Review Code  1- partially meets, needs review/practice      Infection Prevention: - Provides verbal and written material to individual with discussion of infection control including proper hand washing and proper equipment cleaning during exercise session.      Cardiac Rehab from 07/16/2015 in [facility] Cardiac Rehab   Date  07/02/15   Educator  C. [place]   Instruction Review Code  1- partially meets, needs review/practice      Falls Prevention: - Provides verbal and written material to individual with discussion of falls prevention and safety.      Cardiac Rehab from 07/16/2015 in [facility] Cardiac Rehab   Date  07/02/15   Educator  C. [place]   Instruction Review Code  2- meets goals/outcomes      Diabetes: - Individual verbal and written instruction to review signs/symptoms of diabetes, desired ranges of glucose level fasting, after meals and with exercise. Advice that pre and post exercise glucose checks will be done for 3 sessions at entry of program.      Cardiac Rehab from 07/16/2015 in [facility] Cardiac Rehab   Date  07/02/15   Educator  C. EnterkinRN  Instruction Review Code  2- meets goals/outcomes       Knowledge Questionnaire Score:     Knowledge Questionnaire Score - 07/02/15 1451    Knowledge Questionnaire Score   Pre Score 27      Personal Goals and Risk Factors at Admission:     Personal Goals and Risk Factors at Admission - 07/02/15 1447    Personal Goals and Risk Factors on Admission   Increase Aerobic Exercise and Physical Activity Yes   Intervention While in program, learn and follow the exercise prescription taught. Start at a low level workload and increase workload after able to maintain previous level for 30 minutes. Increase time before increasing intensity.  Goal time to achieve goal by 36 sessions.    Diabetes Yes   Goal Blood glucose control identified by blood glucose values, HgbA1C. Participant verbalizes understanding  of the signs/symptoms of hyper/hypo glycemia, proper foot care and importance of medication and nutrition plan for blood glucose control.   Intervention Provide nutrition & aerobic exercise along with prescribed medications to achieve blood glucose in normal ranges: Fasting 65-99 mg/dL   Hypertension Yes   Goal Participant will see blood pressure controlled within the values of 140/59m/Hg or within value directed by their physician.   Intervention Provide nutrition & aerobic exercise along with prescribed medications to achieve BP 140/90 or less.   Lipids Yes   Goal Cholesterol controlled with medications as prescribed, with individualized exercise RX and with personalized nutrition plan. Value goals: LDL < 714m HDL > 406mParticipant states understanding of desired cholesterol values and following prescriptions.   Intervention Provide nutrition & aerobic exercise along with prescribed medications to achieve LDL <83m59mDL >40mg27m   Personal Goals and Risk Factors Review:      Goals and Risk Factor Review      07/09/15 1737           Increase Aerobic Exercise and Physical Activity   Goals Progress/Improvement seen  No       Comments Today was first exsecise session.  Spoke with Bradley [name] his overall goals and short term goal ssetting.  He does want to return to his golf game. He states that will be awhile before that happens. Also he wants to return to exercising 30 minutes 3 times a day and doing weights. Short term goal is to attend class 3 days a week and follow the exerciseprescription that he is given.           Personal Goals Discharge (Final Personal Goals and Risk Factors Review):      Goals and Risk Factor Review - 07/09/15 1737    Increase Aerobic Exercise and Physical Activity   Goals Progress/Improvement seen  No   Comments Today was first exsecise session.  Spoke with Bradley [name] his overall goals and short term goal ssetting.  He does want to return to his golf  game. He states that will be awhile before that happens. Also he wants to return to exercising 30 minutes 3 times a day and doing weights. Short term goal is to attend class 3 days a week and follow the exerciseprescription that he is given.       ITP Comments:     ITP Comments      07/17/15 1318           ITP Comments 30 day review preparation  Continue with ITP   New to program only a few visits completed  Comments:

## 2015-07-18 ENCOUNTER — Encounter: Payer: PPO | Admitting: *Deleted

## 2015-07-18 DIAGNOSIS — Z951 Presence of aortocoronary bypass graft: Secondary | ICD-10-CM | POA: Diagnosis not present

## 2015-07-18 NOTE — Progress Notes (Signed)
Daily Session Note  Patient Details  Name: Bradley [name] MRN: [id number] Date of Birth: 1946/05/09 Referring Provider:  Belva Crome, MD  Encounter Date: 07/18/2015  Check In:     Session Check In - 07/18/15 1615    Check-In   Staff Present Gerlene Burdock, RN, Drusilla Kanner, MS, ACSM CEP, Exercise Physiologist;Diane Joya Gaskins, RN, BSN   ER physicians immediately available to respond to emergencies See telemetry face sheet for immediately available ER MD   Medication changes reported     No   Fall or balance concerns reported    No   Warm-up and Cool-down Performed on first and last piece of equipment   Pain Assessment   Currently in Pain? No/denies           Exercise Prescription Changes - 07/18/15 1600    Exercise Review   Progression Yes   Response to Exercise   Duration Progress to 30 minutes of continuous aerobic without signs/symptoms of physical distress   Intensity Rest + 30   Progression Continue progressive overload as per policy without signs/symptoms or physical distress.   Resistance Training   Training Prescription Yes   Weight 3   Reps 10-15   Interval Training   Interval Training No   Recumbant Bike   Level 3   RPM 50   Minutes 20   NuStep   Level 3   Watts 50   Minutes 20   Biostep-RELP   Level 6   Watts 40   Minutes 20      Goals Met:  Exercise tolerated well Personal goals reviewed No report of cardiac concerns or symptoms Strength training completed today  Goals Unmet:  Not Applicable  Goals Comments: Patient completed exercise prescription and all exercise goals during rehab session. The exercise was tolerated well and the patient is progressing in the program.    Dr. Emily Filbert is Medical Director for [place] and [facility].

## 2015-07-18 NOTE — Progress Notes (Signed)
Cardiac Individual Treatment Plan  Patient Details  Name: Bradley [name] MRN: [id number] Date of Birth: 11-Oct-1945 Referring Provider:  Belva Crome, MD  Initial Encounter Date:  07/02/2015  Visit Diagnosis: S/P CABG x 3  Patient's Home Medications on Admission:  Current outpatient prescriptions:  .  Ascorbic Acid (VITAMIN C) 100 MG tablet, Take 100 mg by mouth daily., Disp: , Rfl:  .  aspirin EC 81 MG EC tablet, Take 1 tablet (81 mg total) by mouth daily., Disp: , Rfl:  .  atorvastatin (LIPITOR) 40 MG tablet, Take 40 mg by mouth daily.  , Disp: , Rfl:  .  Calcium Carbonate-Vitamin D (CALCIUM-D) 600-400 MG-UNIT TABS, Take 1 tablet by mouth daily. , Disp: , Rfl:  .  Cholecalciferol (D-3-5) 5000 UNITS capsule, Take 5,000 Units by mouth daily., Disp: , Rfl:  .  clopidogrel (PLAVIX) 75 MG tablet, Take 1 tablet (75 mg total) by mouth daily., Disp: 1 tablet, Rfl: 0 .  dexlansoprazole (DEXILANT) 60 MG capsule, Take 60 mg by mouth daily., Disp: , Rfl:  .  diltiazem (CARDIZEM CD) 300 MG 24 hr capsule, Take 1 capsule (300 mg total) by mouth daily., Disp: 30 capsule, Rfl: 1 .  glimepiride (AMARYL) 2 MG tablet, Take 2 mg by mouth daily before breakfast.  , Disp: , Rfl:  .  IRON, FERROUS GLUCONATE, PO, Take 40 mg by mouth 2 (two) times daily., Disp: , Rfl:  .  Liraglutide (VICTOZA [place]), Inject 1.8 mg into the skin daily. , Disp: , Rfl:  .  metFORMIN (GLUCOPHAGE-XR) 500 MG 24 hr tablet, Take 2 tablets (1,000 mg total) by mouth 2 (two) times daily., Disp: , Rfl:  .  metoprolol (LOPRESSOR) 50 MG tablet, Take 1.5 tablets (75 mg total) by mouth 2 (two) times daily., Disp: 90 tablet, Rfl: 11 .  Multiple Vitamin (MULTIVITAMIN) capsule, Take 1 capsule by mouth daily.  , Disp: , Rfl:  .  traMADol (ULTRAM) 50 MG tablet, Take 1 tablet (50 mg total) by mouth every 6 (six) hours as needed for moderate pain., Disp: 30 tablet, Rfl: 0  Past Medical History: Past Medical History  Diagnosis Date  . Hyperlipidemia    . Hypertension   . Diabetes mellitus age 43  . Peripheral vascular disease ([place])   . Arthritis   . Reflux esophagitis   . Stroke [facility]) 1987    Right brain stroke  . Neuropathy ([place]) 2013  . Osteoporosis 2013  . Sleep apnea     uses CPAP  . GERD (gastroesophageal reflux disease)   . H/O hiatal hernia   . Irritable bowel syndrome (IBS)   . Myocardial infarction [facility])     silent inferior MI; patient denies MI history (03/17/13)   . Anemia   . Carotid artery occlusion     a. Carotid US 10/16: RICA 60-79%, L CEA patent with 1-39% stenosis  . Coronary artery disease     a. Myoview 9/16:  EF 52%, inferior fixed defect consistent with diaphragmatic attenuation, intermediate risk secondary to poor exercise tolerance and symptoms during stress;  b. LHC 05/17/2015 90% mid LAD, 80% ost D2, 75% mid RCA, 35% prox RCA. >> S/p CABG  . Diverticulitis   . Wears glasses   . PAF (paroxysmal atrial fibrillation) ([place])     post CABG; Amiodarone stopped 2/2 wheezing;   . History of echocardiogram     a. Echo 9/16: GLS -15.2%, EF 29-51%, grade 1 diastolic dysfunction, normal wall motion, aortic sclerosis, dilated aortic  root 39 mm, atrial septal lipomatous hypertrophy    Tobacco Use: History  Smoking status  . Former Smoker -- 2.00 packs/day for 40 years  . Types: Cigarettes  . Quit date: 03/26/2009  Smokeless tobacco  . Never Used    Labs: Recent Review Flowsheet Data    Labs for ITP Cardiac and Pulmonary Rehab Latest Ref Rng 05/24/2015 05/24/2015 05/24/2015 05/31/2015 06/01/2015   Cholestrol 0 - 200 mg/dL - - - 83 82   LDLCALC 0 - 99 mg/dL - - - 36 26   HDL >40 mg/dL - - - 26(L) 24(L)   Trlycerides <150 mg/dL - - - 104 158(H)   PHART 7.350 - 7.450 - 7.297(L) - - -   PCO2ART 35.0 - 45.0 mmHg - 43.4 - - -   HCO3 20.0 - 24.0 mEq/L - 21.2 - - -   TCO2 0 - 100 mmol/L $RemoveBe'20 22 19 'qjAGJYmdy$ - -   ACIDBASEDEF 0.0 - 2.0 mmol/L - 5.0(H) - - -   O2SAT - - 91.0 - - -       Exercise Target Goals:     Exercise Program Goal: Individual exercise prescription set with THRR, safety & activity barriers. Participant demonstrates ability to understand and report RPE using BORG scale, to self-measure pulse accurately, and to acknowledge the importance of the exercise prescription.  Exercise Prescription Goal: Starting with aerobic activity 30 plus minutes a day, 3 days per week for initial exercise prescription. Provide home exercise prescription and guidelines that participant acknowledges understanding prior to discharge.  Activity Barriers & Risk Stratification:     Activity Barriers & Risk Stratification - 07/02/15 1348    Activity Barriers & Risk Stratification   Activity Barriers Back Problems   Risk Stratification High      6 Minute Walk:     6 Minute Walk      07/02/15 1444       6 Minute Walk   Phase Initial     Distance 1175 feet     Walk Time 6 minutes     Resting HR 98 bpm     Resting BP 138/62 mmHg     Max Ex. HR 102 bpm     Max Ex. BP 140/78 mmHg     RPE 12     Perceived Dyspnea  0     Symptoms No        Initial Exercise Prescription:     Initial Exercise Prescription - 07/02/15 1500    Date of Initial Exercise Prescription   Date 07/02/15   Treadmill   MPH 2.5   Grade 0   Minutes 15   Bike   Level 1   Minutes 15   Recumbant Bike   Level 2   Watts 30   Minutes 15   NuStep   Level 3   Watts 40   Minutes 15   Arm Ergometer   Level 1   Watts 10   Minutes 15   Arm/Foot Ergometer   Level 1   Watts 12   Minutes 15   Cybex   Level 2   RPM 40   Minutes 15   Recumbant Elliptical   Level 1   Watts 20   Minutes 15   Elliptical   Level 1   Speed 2.5   Minutes 5   REL-XR   Level 3   Watts 30   Minutes 15   T5 Nustep   Level 2   Watts 20  Minutes 15   Biostep-RELP   Level 3   Watts 30   Minutes 15   Prescription Details   Frequency (times per week) 3   Duration Progress to 30 minutes of continuous aerobic without  signs/symptoms of physical distress   Intensity   THRR REST +  30   Ratings of Perceived Exertion 11-15   Perceived Dyspnea 2-4   Progression Continue progressive overload as per policy without signs/symptoms or physical distress.   Resistance Training   Training Prescription Yes   Weight 2   Reps 10-15      Exercise Prescription Changes:     Exercise Prescription Changes      07/09/15 1439           Exercise Review   Progression Yes       Response to Exercise   Blood Pressure (Admit) 112/72 mmHg       Blood Pressure (Exercise) 158/62 mmHg       Blood Pressure (Exit) 128/70 mmHg       Heart Rate (Admit) 82 bpm       Heart Rate (Exercise) 96 bpm       Heart Rate (Exit) 81 bpm       Rating of Perceived Exertion (Exercise) 11       Symptoms Back pain at times       Comments First day of exercise! Patient was oriented to the gym and the equipment functions and settings. Procedures and policies of the gym were outlined and explained. The patient's individual exercise prescription and treatment plan were reviewed with them. All starting workloads were established based on the results of the functional testing  done at the initial intake visit. The plan for exercise progression was also introduced and progression will be customized based on the patient's performance and goals.        Duration Progress to 30 minutes of continuous aerobic without signs/symptoms of physical distress       Intensity Rest + 30       Progression Continue progressive overload as per policy without signs/symptoms or physical distress.       Resistance Training   Training Prescription Yes       Weight 2       Reps 10-15       Interval Training   Interval Training No       Recumbant Bike   Level 3       RPM 50       Minutes 20       NuStep   Level 3       Watts 50       Minutes 20          Discharge Exercise Prescription (Final Exercise Prescription Changes):     Exercise Prescription Changes  - 07/09/15 1439    Exercise Review   Progression Yes   Response to Exercise   Blood Pressure (Admit) 112/72 mmHg   Blood Pressure (Exercise) 158/62 mmHg   Blood Pressure (Exit) 128/70 mmHg   Heart Rate (Admit) 82 bpm   Heart Rate (Exercise) 96 bpm   Heart Rate (Exit) 81 bpm   Rating of Perceived Exertion (Exercise) 11   Symptoms Back pain at times   Comments First day of exercise! Patient was oriented to the gym and the equipment functions and settings. Procedures and policies of the gym were outlined and explained. The patient's individual exercise prescription and treatment plan were reviewed with them. All starting  workloads were established based on the results of the functional testing  done at the initial intake visit. The plan for exercise progression was also introduced and progression will be customized based on the patient's performance and goals.    Duration Progress to 30 minutes of continuous aerobic without signs/symptoms of physical distress   Intensity Rest + 30   Progression Continue progressive overload as per policy without signs/symptoms or physical distress.   Resistance Training   Training Prescription Yes   Weight 2   Reps 10-15   Interval Training   Interval Training No   Recumbant Bike   Level 3   RPM 50   Minutes 20   NuStep   Level 3   Watts 50   Minutes 20      Nutrition:  Target Goals: Understanding of nutrition guidelines, daily intake of sodium '1500mg'$ , cholesterol '200mg'$ , calories 30% from fat and 7% or less from saturated fats, daily to have 5 or more servings of fruits and vegetables.  Biometrics:     Pre Biometrics - 07/02/15 1447    Pre Biometrics   Waist Circumference 43 inches   Hip Circumference 38 inches   Waist to Hip Ratio 1.13 %       Nutrition Therapy Plan and Nutrition Goals:     Nutrition Therapy & Goals - 07/02/15 1451    Nutrition Therapy   Drug/Food Interactions Statins/Certain Fruits   Personal Nutrition Goals    Personal Goal #1 Cont. to eat heart healthy and eat to control his diabetes.    Comments Bradley [name] reports that his wife already has him on a heart healthy and a diabetic eating plan. Demetrias said he did not feel he needed to meet individually with the Cardiac Rehab Registered dietician.    Intervention Plan   Intervention Using nutrition plan and personal goals to gain a healthy nutrition lifestyle. Add exercise as prescribed.      Nutrition Discharge: Rate Your Plate Scores:     Rate Your Plate - 96/04/54 0981    Rate Your Plate Scores   Pre Score 71   Pre Score % 79 %      Nutrition Goals Re-Evaluation:   Psychosocial: Target Goals: Acknowledge presence or absence of depression, maximize coping skills, provide positive support system. Participant is able to verbalize types and ability to use techniques and skills needed for reducing stress and depression.  Initial Review & Psychosocial Screening:     Initial Psych Review & Screening - 07/02/15 [place]? Yes   Comments Diem reports that his wife feeds him good food based on a diabetic diet and now a heart diet.    Barriers   Psychosocial barriers to participate in program There are no identifiable barriers or psychosocial needs.   Screening Interventions   Interventions Encouraged to exercise      Quality of Life Scores:     Quality of Life - 07/02/15 1502    Quality of Life Scores   Health/Function Pre 20.4 %   Socioeconomic Pre 26.43 %   Psych/Spiritual Pre 27 %   Family Pre 28.8 %   GLOBAL Pre 24.24 %      PHQ-9:     Recent Review Flowsheet Data    Depression screen [facility] 2/9 07/02/2015 06/21/2015   Decreased Interest 2 0   Down, Depressed, Hopeless 1 0   PHQ - 2 Score 3 0   Altered sleeping 0 -  Tired, decreased energy 2 -   Change in appetite 0 -   Feeling bad or failure about yourself  0 -   Trouble concentrating 0 -   Moving slowly or fidgety/restless 1 -    Suicidal thoughts 0 -   PHQ-9 Score 6 -   Difficult doing work/chores Somewhat difficult -      Psychosocial Evaluation and Intervention:     Psychosocial Evaluation - 07/11/15 1637    Psychosocial Evaluation & Interventions   Interventions Stress management education;Relaxation education;Encouraged to exercise with the program and follow exercise prescription   Comments Counselor met with Mr. Thivierge today (Mr. Jerilynn Mages) for initial psychosocial evaluation.  He is a 69 year old who had triple bypass surgery this past October.  Mr. Jerilynn Mages has a strong support system with a spouse of 79 years and an adult daughter and a step-daughter who live close by.  He has multiple health issues, including type 2 diabetes, GERD and sleep apnea.  He reports that he sleeps well with his CPAP and has a good appetite.  He denies a history of depression or current symptoms.  Mr. Jerilynn Mages states his mood is typically positive and his primary stressors are his health currently.  His goals are to increase his energy level and decrease his level of discomfort from the recent surgery.  He would also like to lose some weight while in this program.  Mr. Jerilynn Mages plans to return to working out 3x/week with his personal trainer once he completes this program and to walk more as well.  Counselor recommended meeting with the dietician to address his weight loss goals.     Continued Psychosocial Services Needed Yes  Mr. M will benefit from the psychoeducational components of this program, especially stress management.  He also will benefit from meeting with the dietician to address weight loss goals.      Psychosocial Re-Evaluation:   Vocational Rehabilitation: Provide vocational rehab assistance to qualifying candidates.   Vocational Rehab Evaluation & Intervention:     Vocational Rehab - 07/02/15 1350    Initial Vocational Rehab Evaluation & Intervention   Assessment shows need for Vocational Rehabilitation No      Education: Education  Goals: Education classes will be provided on a weekly basis, covering required topics. Participant will state understanding/return demonstration of topics presented.  Learning Barriers/Preferences:   Education Topics: General Nutrition Guidelines/Fats and Fiber: -Group instruction provided by verbal, written material, models and posters to present the general guidelines for heart healthy nutrition. Gives an explanation and review of dietary fats and fiber.          Cardiac Rehab from 07/16/2015 in [facility] Cardiac Rehab   Date  07/16/15   Educator  PI   Instruction Review Code  2- meets goals/outcomes      Controlling Sodium/Reading Food Labels: -Group verbal and written material supporting the discussion of sodium use in heart healthy nutrition. Review and explanation with models, verbal and written materials for utilization of the food label.   Exercise Physiology & Risk Factors: - Group verbal and written instruction with models to review the exercise physiology of the cardiovascular system and associated critical values. Details cardiovascular disease risk factors and the goals associated with each risk factor.   Aerobic Exercise & Resistance Training: - Gives group verbal and written discussion on the health impact of inactivity. On the components of aerobic and resistive training programs and the benefits of this training and how to safely progress through these programs.  Flexibility, Balance, General Exercise Guidelines: - Provides group verbal and written instruction on the benefits of flexibility and balance training programs. Provides general exercise guidelines with specific guidelines to those with heart or lung disease. Demonstration and skill practice provided.   Stress Management: - Provides group verbal and written instruction about the health risks of elevated stress, cause of high stress, and healthy ways to reduce stress.   Depression: - Provides group verbal and  written instruction on the correlation between heart/lung disease and depressed mood, treatment options, and the stigmas associated with seeking treatment.   Anatomy & Physiology of the Heart: - Group verbal and written instruction and models provide basic cardiac anatomy and physiology, with the coronary electrical and arterial systems. Review of: AMI, Angina, Valve disease, Heart Failure, Cardiac Arrhythmia, Pacemakers, and the ICD.   Cardiac Procedures: - Group verbal and written instruction and models to describe the testing methods done to diagnose heart disease. Reviews the outcomes of the test results. Describes the treatment choices: Medical Management, Angioplasty, or Coronary Bypass Surgery.   Cardiac Medications: - Group verbal and written instruction to review commonly prescribed medications for heart disease. Reviews the medication, class of the drug, and side effects. Includes the steps to properly store meds and maintain the prescription regimen.   Go Sex-Intimacy & Heart Disease, Get SMART - Goal Setting: - Group verbal and written instruction through game format to discuss heart disease and the return to sexual intimacy. Provides group verbal and written material to discuss and apply goal setting through the application of the S.M.A.R.T. Method.   Other Matters of the Heart: - Provides group verbal, written materials and models to describe Heart Failure, Angina, Valve Disease, and Diabetes in the realm of heart disease. Includes description of the disease process and treatment options available to the cardiac patient.      Cardiac Rehab from 07/16/2015 in [facility] Cardiac Rehab   Date  07/11/15   Educator  DW   Instruction Review Code  2- meets goals/outcomes      Exercise & Equipment Safety: - Individual verbal instruction and demonstration of equipment use and safety with use of the equipment.      Cardiac Rehab from 07/16/2015 in [facility] Cardiac Rehab   Date  07/02/15    Educator  C. EnterkinRN   Instruction Review Code  1- partially meets, needs review/practice      Infection Prevention: - Provides verbal and written material to individual with discussion of infection control including proper hand washing and proper equipment cleaning during exercise session.      Cardiac Rehab from 07/16/2015 in [facility] Cardiac Rehab   Date  07/02/15   Educator  C. EnterkinRN   Instruction Review Code  1- partially meets, needs review/practice      Falls Prevention: - Provides verbal and written material to individual with discussion of falls prevention and safety.      Cardiac Rehab from 07/16/2015 in [facility] Cardiac Rehab   Date  07/02/15   Educator  C. EnterkinRN   Instruction Review Code  2- meets goals/outcomes      Diabetes: - Individual verbal and written instruction to review signs/symptoms of diabetes, desired ranges of glucose level fasting, after meals and with exercise. Advice that pre and post exercise glucose checks will be done for 3 sessions at entry of program.      Cardiac Rehab from 07/16/2015 in [facility] Cardiac Rehab   Date  07/02/15   Educator  C. EnterkinRN  Instruction Review Code  2- meets goals/outcomes       Knowledge Questionnaire Score:     Knowledge Questionnaire Score - 07/02/15 1451    Knowledge Questionnaire Score   Pre Score 27      Personal Goals and Risk Factors at Admission:     Personal Goals and Risk Factors at Admission - 07/02/15 1447    Personal Goals and Risk Factors on Admission   Increase Aerobic Exercise and Physical Activity Yes   Intervention While in program, learn and follow the exercise prescription taught. Start at a low level workload and increase workload after able to maintain previous level for 30 minutes. Increase time before increasing intensity.  Goal time to achieve goal by 36 sessions.    Diabetes Yes   Goal Blood glucose control identified by blood glucose values, HgbA1C. Participant verbalizes  understanding of the signs/symptoms of hyper/hypo glycemia, proper foot care and importance of medication and nutrition plan for blood glucose control.   Intervention Provide nutrition & aerobic exercise along with prescribed medications to achieve blood glucose in normal ranges: Fasting 65-99 mg/dL   Hypertension Yes   Goal Participant will see blood pressure controlled within the values of 140/107mm/Hg or within value directed by their physician.   Intervention Provide nutrition & aerobic exercise along with prescribed medications to achieve BP 140/90 or less.   Lipids Yes   Goal Cholesterol controlled with medications as prescribed, with individualized exercise RX and with personalized nutrition plan. Value goals: LDL < $Rem'70mg'RZGj$ , HDL > $Rem'40mg'QNvV$ . Participant states understanding of desired cholesterol values and following prescriptions.   Intervention Provide nutrition & aerobic exercise along with prescribed medications to achieve LDL '70mg'$ , HDL >$Remo'40mg'HXwcS$ .      Personal Goals and Risk Factors Review:      Goals and Risk Factor Review      07/09/15 1737           Increase Aerobic Exercise and Physical Activity   Goals Progress/Improvement seen  No       Comments Today was first exsecise session.  Spoke with Quinten about his overall goals and short term goal ssetting.  He does want to return to his golf game. He states that will be awhile before that happens. Also he wants to return to exercising 30 minutes 3 times a day and doing weights. Short term goal is to attend class 3 days a week and follow the exerciseprescription that he is given.           Personal Goals Discharge (Final Personal Goals and Risk Factors Review):      Goals and Risk Factor Review - 07/09/15 1737    Increase Aerobic Exercise and Physical Activity   Goals Progress/Improvement seen  No   Comments Today was first exsecise session.  Spoke with Adilson about his overall goals and short term goal ssetting.  He does want to  return to his golf game. He states that will be awhile before that happens. Also he wants to return to exercising 30 minutes 3 times a day and doing weights. Short term goal is to attend class 3 days a week and follow the exerciseprescription that he is given.       ITP Comments:     ITP Comments      07/17/15 1318           ITP Comments 30 day review preparation  Continue with ITP   New to program only a few visits completed  Comments: 30 day review. Cont with ITP.

## 2015-07-18 NOTE — Addendum Note (Signed)
Addended by: Gerlene Burdock on: 07/18/2015 11:23 AM   Modules accepted: Orders

## 2015-07-19 ENCOUNTER — Encounter: Payer: PPO | Admitting: Cardiothoracic Surgery

## 2015-07-23 DIAGNOSIS — Z951 Presence of aortocoronary bypass graft: Secondary | ICD-10-CM

## 2015-07-23 NOTE — Progress Notes (Signed)
Daily Session Note  Patient Details  Name: HURBERT DURAN MRN: [id number] Date of Birth: [birth year] Referring Provider:  Belva Crome, MD  Encounter Date: 07/23/2015  Check In:     Session Check In - 07/23/15 1617    Check-In   Staff Present Heath Lark, RN, BSN, CCRP;Carroll Enterkin, RN, BSN;Dajah Fischman, BS, ACSM EP-C, Exercise Physiologist   ER physicians immediately available to respond to emergencies See telemetry face sheet for immediately available ER MD   Medication changes reported     No   Fall or balance concerns reported    No   Warm-up and Cool-down Performed on first and last piece of equipment   VAD Patient? No   Pain Assessment   Currently in Pain? No/denies         Goals Met:  Proper associated with RPD/PD & O2 Sat Exercise tolerated well No report of cardiac concerns or symptoms Strength training completed today  Goals Unmet:  Not Applicable  Goals Comments:    Dr. Emily Filbert is Medical Director for [place] and [facility].

## 2015-07-25 ENCOUNTER — Encounter: Payer: PPO | Admitting: *Deleted

## 2015-07-25 DIAGNOSIS — Z951 Presence of aortocoronary bypass graft: Secondary | ICD-10-CM

## 2015-07-25 NOTE — Progress Notes (Signed)
Daily Session Note  Patient Details  Name: DARRIL PATRIARCA MRN: [id number] Date of Birth: 1946-08-02 Referring Provider:  Belva Crome, MD  Encounter Date: 07/25/2015  Check In:     Session Check In - 07/25/15 1606    Check-In   Staff Present Heath Lark, RN, BSN, CCRP;Carroll Enterkin, RN, Drusilla Kanner, MS, ACSM CEP, Exercise Physiologist   ER physicians immediately available to respond to emergencies See telemetry face sheet for immediately available ER MD   Medication changes reported     No   Fall or balance concerns reported    No   Warm-up and Cool-down Performed on first and last piece of equipment   VAD Patient? No   Pain Assessment   Currently in Pain? No/denies         Goals Met:  Exercise tolerated well No report of cardiac concerns or symptoms Strength training completed today  Goals Unmet:  Not Applicable  Goals Comments: Doing well with exercise program ands working on nutrition changes.   Dr. Emily Filbert is Medical Director for [place] and [facility].

## 2015-07-26 ENCOUNTER — Encounter: Payer: PPO | Admitting: Cardiothoracic Surgery

## 2015-07-26 DIAGNOSIS — Z951 Presence of aortocoronary bypass graft: Secondary | ICD-10-CM | POA: Diagnosis not present

## 2015-07-26 NOTE — Progress Notes (Signed)
Daily Session Note  Patient Details  Name: Bradley [name] MRN: [id number] Date of Birth: [birth year] Referring Provider:  Belva Crome, MD  Encounter Date: 07/26/2015  Check In:     Session Check In - 07/26/15 1633    Check-In   Staff Present Lestine Box, BS, ACSM EP-C, Exercise Physiologist;Carroll Enterkin, RN, BSN;Mary Kellie Shropshire, RN   ER physicians immediately available to respond to emergencies See telemetry face sheet for immediately available ER MD   Medication changes reported     No   Fall or balance concerns reported    No   Warm-up and Cool-down Performed on first and last piece of equipment   VAD Patient? No   Pain Assessment   Currently in Pain? No/denies         Goals Met:  Proper associated with RPD/PD & O2 Sat Exercise tolerated well No report of cardiac concerns or symptoms Strength training completed today  Goals Unmet:  Not Applicable  Goals Comments:    Dr. Emily Filbert is Medical Director for [facility] and [facility].

## 2015-07-27 ENCOUNTER — Ambulatory Visit (INDEPENDENT_AMBULATORY_CARE_PROVIDER_SITE_OTHER): Payer: Self-pay | Admitting: Cardiothoracic Surgery

## 2015-07-27 ENCOUNTER — Encounter: Payer: Self-pay | Admitting: Cardiothoracic Surgery

## 2015-07-27 VITALS — BP 126/73 | HR 64 | Resp 20 | Ht 72.0 in | Wt 198.0 lb

## 2015-07-27 DIAGNOSIS — I25810 Atherosclerosis of coronary artery bypass graft(s) without angina pectoris: Secondary | ICD-10-CM

## 2015-07-27 DIAGNOSIS — Z951 Presence of aortocoronary bypass graft: Secondary | ICD-10-CM

## 2015-07-27 NOTE — Progress Notes (Signed)
[address]       [place] [address]             [phone]      Taylin T Sorci [facility] Medical Record [id number] Date of Birth: 01-Jul-1946  Referring: Belva Crome, MD Primary Care:  Melinda Crutch, MD  Chief Complaint:   POST OP FOLLOW UP 05/23/2015 PREOPERATIVE DIAGNOSIS: Coronary occlusive disease with positive stress test. POSTOPERATIVE DIAGNOSIS: Coronary occlusive disease with positive stress test. SURGICAL PROCEDURE: Coronary artery bypass grafting x3 with the left internal mammary to the left anterior descending coronary artery, reverse saphenous vein graft to the diagonal coronary artery, reverse saphenous vein graft to the posterior descending coronary artery with right thigh greater saphenous vein harvested endoscopically. SURGEON: Lanelle Bal, MD.  History of Present Illness:     Patient doing well postoperatively, currently enrolled in cardiac rehabilitation. Was very proud of the fact that he did walk 2 miles in less than an hour yesterday. He did note several weeks ago having spell on the weekend when he noted his heart rate was between 100 and 111 the following day it had decreased to 60. He's had no episodes of atrial fib while in cardiac rehabilitation. He denies recurrent angina or evidence of congestive heart failure     Past Medical History  Diagnosis Date  . Hyperlipidemia   . Hypertension   . Diabetes mellitus age 28  . Peripheral vascular disease ([place])   . Arthritis   . Reflux esophagitis   . Stroke [facility]) 1987    Right brain stroke  . Neuropathy ([place]) 2013  . Osteoporosis 2013  . Sleep apnea     uses CPAP  . GERD (gastroesophageal reflux disease)   . H/O hiatal hernia   . Irritable bowel syndrome (IBS)   . Myocardial infarction [facility])     silent inferior MI; patient denies MI history (03/17/13)   . Anemia   . Carotid artery occlusion     a. Carotid US 10/16: RICA 60-79%, L CEA patent with 1-39% stenosis    . Coronary artery disease     a. Myoview 9/16:  EF 52%, inferior fixed defect consistent with diaphragmatic attenuation, intermediate risk secondary to poor exercise tolerance and symptoms during stress;  b. LHC 05/17/2015 90% mid LAD, 80% ost D2, 75% mid RCA, 35% prox RCA. >> S/p CABG  . Diverticulitis   . Wears glasses   . PAF (paroxysmal atrial fibrillation) ([place])     post CABG; Amiodarone stopped 2/2 wheezing;   . History of echocardiogram     a. Echo 9/16: GLS -15.2%, EF 0000000, grade 1 diastolic dysfunction, normal wall motion, aortic sclerosis, dilated aortic root 39 mm, atrial septal lipomatous hypertrophy     History  Smoking status  . Former Smoker -- 2.00 packs/day for 40 years  . Types: Cigarettes  . Quit date: 03/26/2009  Smokeless tobacco  . Never Used    History  Alcohol Use No     Allergies  Allergen Reactions  . Hydrocodone Nausea Only  . Hydromorphone Other (See Comments)  . Oxycodone Nausea Only  . Penicillins Rash    Has patient had a PCN reaction causing immediate rash, facial/tongue/throat swelling, SOB or lightheadedness with hypotension: NO Has patient had a PCN reaction causing severe rash involving mucus membranes or skin necrosis:NO Has patient had a PCN reaction that required hospitalization NO Has patient had a PCN reaction occurring within the last 10 years: NO  If all of the above answers are "NO", then may proceed with Cephalosporin use.   [facility] Cross Reactors Rash    Current Outpatient Prescriptions  Medication Sig Dispense Refill  . Ascorbic Acid (VITAMIN C) 100 MG tablet Take 100 mg by mouth daily.    Marland Kitchen aspirin EC 81 MG EC tablet Take 1 tablet (81 mg total) by mouth daily.    Marland Kitchen atorvastatin (LIPITOR) 40 MG tablet Take 40 mg by mouth daily.      . Calcium Carbonate-Vitamin D (CALCIUM-D) 600-400 MG-UNIT TABS Take 1 tablet by mouth daily.     . Cholecalciferol (D-3-5) 5000 UNITS capsule Take 5,000 Units by mouth daily.    .  clopidogrel (PLAVIX) 75 MG tablet Take 1 tablet (75 mg total) by mouth daily. 1 tablet 0  . dexlansoprazole (DEXILANT) 60 MG capsule Take 60 mg by mouth daily.    Marland Kitchen diltiazem (CARDIZEM CD) 300 MG 24 hr capsule Take 1 capsule (300 mg total) by mouth daily. 30 capsule 1  . glimepiride (AMARYL) 2 MG tablet Take 2 mg by mouth daily before breakfast.      . IRON, FERROUS GLUCONATE, PO Take 40 mg by mouth 2 (two) times daily.    . Liraglutide (VICTOZA [place]) Inject 1.8 mg into the skin daily.     . metFORMIN (GLUCOPHAGE-XR) 500 MG 24 hr tablet Take 2 tablets (1,000 mg total) by mouth 2 (two) times daily.    . metoprolol (LOPRESSOR) 50 MG tablet Take 1.5 tablets (75 mg total) by mouth 2 (two) times daily. 90 tablet 11  . Multiple Vitamin (MULTIVITAMIN) capsule Take 1 capsule by mouth daily.      . traMADol (ULTRAM) 50 MG tablet Take 1 tablet (50 mg total) by mouth every 6 (six) hours as needed for moderate pain. 30 tablet 0   No current facility-administered medications for this visit.       Physical Exam: BP 126/73 mmHg  Pulse 64  Resp 20  Ht 6' (1.829 m)  Wt 198 lb (89.812 kg)  BMI 26.85 kg/m2  SpO2 96%  General appearance: alert, cooperative and appears stated age Neurologic: intact Heart: regular rate and rhythm, S1, S2 normal, no murmur, click, rub or gallop Lungs: clear to auscultation bilaterally Abdomen: soft, non-tender; bowel sounds normal; no masses,  no organomegaly Extremities: extremities normal, atraumatic, no cyanosis or edema and Homans sign is negative, no sign of DVT Wound: Sternum is stable and well-healed   Diagnostic Studies & Laboratory data:     Recent Radiology Findings:   No results found.    Recent Lab Findings: Lab Results  Component Value Date   WBC 8.7 06/13/2015   HGB 11.3* 06/13/2015   HCT 34.8* 06/13/2015   PLT 429* 06/13/2015   GLUCOSE 87 06/13/2015   CHOL 82 06/01/2015   TRIG 158* 06/01/2015   HDL 24* 06/01/2015   LDLCALC 26 06/01/2015    ALT 18 05/30/2015   AST 14* 05/30/2015   NA 131* 06/13/2015   K 4.1 06/13/2015   CL 98 06/13/2015   CREATININE 1.41* 06/13/2015   BUN 18 06/13/2015   CO2 23 06/13/2015   TSH 2.630 05/26/2015   INR 1.15 05/29/2015   HGBA1C 8.1* 05/21/2015      Assessment / Plan:     Patient doing well, progressing in cardiac rehabilitation. He is about 2 months postop currently I've instructed him to call if he has any further symptoms suggestive of atrial fibrillation, currently off of amiodarone  and continues on 81 mg of aspirin and Plavix Cardizem and Lopressor. Plan to see him back in 6 weeks   Grace Isaac MD      [address] [place] [address] Office [phone]   Beeper [phone]  07/27/2015 12:41 PM

## 2015-07-31 ENCOUNTER — Telehealth: Payer: Self-pay

## 2015-07-31 DIAGNOSIS — R002 Palpitations: Secondary | ICD-10-CM

## 2015-07-31 NOTE — Telephone Encounter (Signed)
-----   Message from Belva Crome, MD sent at 07/28/2015 10:42 AM EST ----- Regarding: Palpitations and h/o AF Please schedule 30 day monitor to r/o AF

## 2015-07-31 NOTE — Telephone Encounter (Signed)
Called to give pt Dr.Smith's recommendation below. [email or web address] 

## 2015-08-01 ENCOUNTER — Encounter: Payer: PPO | Admitting: *Deleted

## 2015-08-01 DIAGNOSIS — Z951 Presence of aortocoronary bypass graft: Secondary | ICD-10-CM | POA: Diagnosis not present

## 2015-08-01 MED ORDER — DILTIAZEM HCL ER COATED BEADS 300 MG PO CP24: 300.0000 mg | ORAL_CAPSULE | Freq: Every day | ORAL | Status: DC

## 2015-08-01 NOTE — Telephone Encounter (Signed)
Pt aware of Dr.Smith's recommendation below. Please schedule 30 day monitor to r/o AF  Pt agreeable, adv him a scheduler from our office will call him to reschedule.  Pt rqst a refill for Diltiazem. Done Message routed to pcc to call pt

## 2015-08-01 NOTE — Progress Notes (Signed)
Daily Session Note  Patient Details  Name: Bradley [name] MRN: [id number] Date of Birth: September 13, 1945 Referring Provider:  Belva Crome, MD  Encounter Date: 08/01/2015  Check In:     Session Check In - 08/01/15 1720    Check-In   Staff Present Candiss Norse, MS, ACSM CEP, Exercise Physiologist;Mary Kellie Shropshire, RN;Diane Joya Gaskins, RN, BSN   ER physicians immediately available to respond to emergencies See telemetry face sheet for immediately available ER MD   Medication changes reported     No   Fall or balance concerns reported    No   Warm-up and Cool-down Performed on first and last piece of equipment   VAD Patient? No   Pain Assessment   Currently in Pain? No/denies   Multiple Pain Sites No         Goals Met:  Independence with exercise equipment Exercise tolerated well No report of cardiac concerns or symptoms Strength training completed today  Goals Unmet:  Not Applicable  Goals Comments: Patient completed exercise prescription and all exercise goals during rehab session. The exercise was tolerated well and the patient is progressing in the program.    Dr. Emily Filbert is Medical Director for [facility] and [facility].

## 2015-08-02 ENCOUNTER — Encounter: Payer: PPO | Admitting: *Deleted

## 2015-08-02 DIAGNOSIS — Z951 Presence of aortocoronary bypass graft: Secondary | ICD-10-CM

## 2015-08-02 NOTE — Progress Notes (Signed)
Daily Session Note  Patient Details  Name: Bradley [name] MRN: [id number] Date of Birth: 20-Jul-1946 Referring Provider:  Belva Crome, MD  Encounter Date: 08/02/2015  Check In:     Session Check In - 08/02/15 1627    Check-In   Staff Present Earlean Shawl, BS, ACSM CEP, Exercise Physiologist;Justyce Yeater, RN, BSN;Diane Joya Gaskins, RN, BSN   ER physicians immediately available to respond to emergencies See telemetry face sheet for immediately available ER MD   Medication changes reported     No   Fall or balance concerns reported    No   Warm-up and Cool-down Performed on first and last piece of equipment   VAD Patient? No   Pain Assessment   Currently in Pain? No/denies         Goals Met:  Proper associated with RPD/PD & O2 Sat Exercise tolerated well  Goals Unmet:  Not Applicable  Goals Comments:    Dr. Emily Filbert is Medical Director for [place] and [facility].

## 2015-08-07 ENCOUNTER — Ambulatory Visit (INDEPENDENT_AMBULATORY_CARE_PROVIDER_SITE_OTHER): Payer: PPO

## 2015-08-07 DIAGNOSIS — R002 Palpitations: Secondary | ICD-10-CM | POA: Diagnosis not present

## 2015-08-09 DIAGNOSIS — Z951 Presence of aortocoronary bypass graft: Secondary | ICD-10-CM | POA: Diagnosis not present

## 2015-08-09 NOTE — Progress Notes (Signed)
Daily Session Note  Patient Details  Name: Bradley [name] MRN: [id number] Date of Birth: 06-22-1946 Referring Provider:  Belva Crome, MD  Encounter Date: 08/09/2015  Check In:     Session Check In - 08/09/15 1603    Check-In   Staff Present Gerlene Burdock, RN, BSN;Maddox Hlavaty, BS, ACSM EP-C, Exercise Physiologist;Diane Joya Gaskins, RN, BSN   ER physicians immediately available to respond to emergencies See telemetry face sheet for immediately available ER MD   Medication changes reported     No   Fall or balance concerns reported    No   Warm-up and Cool-down Performed on first and last piece of equipment   VAD Patient? No   Pain Assessment   Currently in Pain? No/denies         Goals Met:  Proper associated with RPD/PD & O2 Sat Exercise tolerated well No report of cardiac concerns or symptoms Strength training completed today  Goals Unmet:  Not Applicable  Goals Comments:    Dr. Emily Filbert is Medical Director for [place] and [facility].

## 2015-08-12 NOTE — Progress Notes (Signed)
Cardiac Individual Treatment Plan  Patient Details  Name: Bradley [name] MRN: [id number] Date of Birth: 17-Dec-1945 Referring Provider:  Belva Crome, MD  Initial Encounter Date:    Visit Diagnosis: S/P CABG x 3  Patient's Home Medications on Admission:  Current outpatient prescriptions:  .  Ascorbic Acid (VITAMIN C) 100 MG tablet, Take 100 mg by mouth daily., Disp: , Rfl:  .  aspirin EC 81 MG EC tablet, Take 1 tablet (81 mg total) by mouth daily., Disp: , Rfl:  .  atorvastatin (LIPITOR) 40 MG tablet, Take 40 mg by mouth daily.  , Disp: , Rfl:  .  Calcium Carbonate-Vitamin D (CALCIUM-D) 600-400 MG-UNIT TABS, Take 1 tablet by mouth daily. , Disp: , Rfl:  .  Cholecalciferol (D-3-5) 5000 UNITS capsule, Take 5,000 Units by mouth daily., Disp: , Rfl:  .  clopidogrel (PLAVIX) 75 MG tablet, Take 1 tablet (75 mg total) by mouth daily., Disp: 1 tablet, Rfl: 0 .  dexlansoprazole (DEXILANT) 60 MG capsule, Take 60 mg by mouth daily., Disp: , Rfl:  .  diltiazem (CARDIZEM CD) 300 MG 24 hr capsule, Take 1 capsule (300 mg total) by mouth daily., Disp: 30 capsule, Rfl: 5 .  glimepiride (AMARYL) 2 MG tablet, Take 2 mg by mouth daily before breakfast.  , Disp: , Rfl:  .  IRON, FERROUS GLUCONATE, PO, Take 40 mg by mouth 2 (two) times daily., Disp: , Rfl:  .  Liraglutide (VICTOZA Bryant), Inject 1.8 mg into the skin daily. , Disp: , Rfl:  .  metFORMIN (GLUCOPHAGE-XR) 500 MG 24 hr tablet, Take 2 tablets (1,000 mg total) by mouth 2 (two) times daily., Disp: , Rfl:  .  metoprolol (LOPRESSOR) 50 MG tablet, Take 1.5 tablets (75 mg total) by mouth 2 (two) times daily., Disp: 90 tablet, Rfl: 11 .  Multiple Vitamin (MULTIVITAMIN) capsule, Take 1 capsule by mouth daily.  , Disp: , Rfl:  .  traMADol (ULTRAM) 50 MG tablet, Take 1 tablet (50 mg total) by mouth every 6 (six) hours as needed for moderate pain., Disp: 30 tablet, Rfl: 0  Past Medical History: Past Medical History  Diagnosis Date  . Hyperlipidemia   .  Hypertension   . Diabetes mellitus age 43  . Peripheral vascular disease ([place])   . Arthritis   . Reflux esophagitis   . Stroke [facility]) 1987    Right brain stroke  . Neuropathy ([place]) 2013  . Osteoporosis 2013  . Sleep apnea     uses CPAP  . GERD (gastroesophageal reflux disease)   . H/O hiatal hernia   . Irritable bowel syndrome (IBS)   . Myocardial infarction [facility])     silent inferior MI; patient denies MI history (03/17/13)   . Anemia   . Carotid artery occlusion     a. Carotid US 10/16: RICA 60-79%, L CEA patent with 1-39% stenosis  . Coronary artery disease     a. Myoview 9/16:  EF 52%, inferior fixed defect consistent with diaphragmatic attenuation, intermediate risk secondary to poor exercise tolerance and symptoms during stress;  b. LHC 05/17/2015 90% mid LAD, 80% ost D2, 75% mid RCA, 35% prox RCA. >> S/p CABG  . Diverticulitis   . Wears glasses   . PAF (paroxysmal atrial fibrillation) ([place])     post CABG; Amiodarone stopped 2/2 wheezing;   . History of echocardiogram     a. Echo 9/16: GLS -15.2%, EF 23-76%, grade 1 diastolic dysfunction, normal wall motion, aortic sclerosis, dilated aortic  root 39 mm, atrial septal lipomatous hypertrophy    Tobacco Use: History  Smoking status  . Former Smoker -- 2.00 packs/day for 40 years  . Types: Cigarettes  . Quit date: 03/26/2009  Smokeless tobacco  . Never Used    Labs: Recent Review Flowsheet Data    Labs for ITP Cardiac and Pulmonary Rehab Latest Ref Rng 05/24/2015 05/24/2015 05/24/2015 05/31/2015 06/01/2015   Cholestrol 0 - 200 mg/dL - - - 83 82   LDLCALC 0 - 99 mg/dL - - - 36 26   HDL >40 mg/dL - - - 26(L) 24(L)   Trlycerides <150 mg/dL - - - 104 158(H)   PHART 7.350 - 7.450 - 7.297(L) - - -   PCO2ART 35.0 - 45.0 mmHg - 43.4 - - -   HCO3 20.0 - 24.0 mEq/L - 21.2 - - -   TCO2 0 - 100 mmol/L _0 - -   ACIDBASEDEF 0.0 - 2.0 mmol/L - 5.0(H) - - -   O2SAT - - 91.0 - - -       Exercise Target Goals:    Exercise  Program Goal: Individual exercise prescription set with THRR, safety & activity barriers. Participant demonstrates ability to understand and report RPE using BORG scale, to self-measure pulse accurately, and to acknowledge the importance of the exercise prescription.  Exercise Prescription Goal: Starting with aerobic activity 30 plus minutes a day, 3 days per week for initial exercise prescription. Provide home exercise prescription and guidelines that participant acknowledges understanding prior to discharge.  Activity Barriers & Risk Stratification:     Activity Barriers & Risk Stratification - 07/02/15 1348    Activity Barriers & Risk Stratification   Activity Barriers Back Problems   Risk Stratification High      6 Minute Walk:     6 Minute Walk      07/02/15 1444       6 Minute Walk   Phase Initial     Distance 1175 feet     Walk Time 6 minutes     Resting HR 98 bpm     Resting BP 138/62 mmHg     Max Ex. HR 102 bpm     Max Ex. BP 140/78 mmHg     RPE 12     Perceived Dyspnea  0     Symptoms No        Initial Exercise Prescription:     Initial Exercise Prescription - 07/02/15 1500    Date of Initial Exercise Prescription   Date 07/02/15   Treadmill   MPH 2.5   Grade 0   Minutes 15   Bike   Level 1   Minutes 15   Recumbant Bike   Level 2   Watts 30   Minutes 15   NuStep   Level 3   Watts 40   Minutes 15   Arm Ergometer   Level 1   Watts 10   Minutes 15   Arm/Foot Ergometer   Level 1   Watts 12   Minutes 15   Cybex   Level 2   RPM 40   Minutes 15   Recumbant Elliptical   Level 1   Watts 20   Minutes 15   Elliptical   Level 1   Speed 2.5   Minutes 5   REL-XR   Level 3   Watts 30   Minutes 15   T5 Nustep   Level 2   Watts 20  Minutes 15   Biostep-RELP   Level 3   Watts 30   Minutes 15   Prescription Details   Frequency (times per week) 3   Duration Progress to 30 minutes of continuous aerobic without signs/symptoms of  physical distress   Intensity   THRR REST +  30   Ratings of Perceived Exertion 11-15   Perceived Dyspnea 2-4   Progression Continue progressive overload as per policy without signs/symptoms or physical distress.   Resistance Training   Training Prescription Yes   Weight 2   Reps 10-15      Exercise Prescription Changes:     Exercise Prescription Changes      07/09/15 1439 07/18/15 1600 08/02/15 1249       Exercise Review   Progression Yes Yes Yes     Response to Exercise   Blood Pressure (Admit) 112/72 mmHg  130/68 mmHg     Blood Pressure (Exercise) 158/62 mmHg  148/70 mmHg     Blood Pressure (Exit) 128/70 mmHg  122/60 mmHg     Heart Rate (Admit) 82 bpm  61 bpm     Heart Rate (Exercise) 96 bpm  97 bpm     Heart Rate (Exit) 81 bpm  64 bpm     Rating of Perceived Exertion (Exercise) 11  15     Symptoms Back pain at times  No new pain other than chronic back pain     Comments First day of exercise! Patient was oriented to the gym and the equipment functions and settings. Procedures and policies of the gym were outlined and explained. The patient's individual exercise prescription and treatment plan were reviewed with them. All starting workloads were established based on the results of the functional testing  done at the initial intake visit. The plan for exercise progression was also introduced and progression will be customized based on the patient's performance and goals.   Brandom is starting to plateau on some of his exercise progressions. Progression will be slower now that he is working close to his full capacity on each machine. He can continuously exercise for the entire class. We will now use interval training to progress him with his exercise intensity.      Duration Progress to 30 minutes of continuous aerobic without signs/symptoms of physical distress Progress to 30 minutes of continuous aerobic without signs/symptoms of physical distress Progress to 30 minutes of  continuous aerobic without signs/symptoms of physical distress     Intensity Rest + 30 Rest + 30 Rest + 30     Progression Continue progressive overload as per policy without signs/symptoms or physical distress. Continue progressive overload as per policy without signs/symptoms or physical distress. Continue progressive overload as per policy without signs/symptoms or physical distress.     Resistance Training   Training Prescription Yes Yes Yes     Weight '2 3 3     '$ Reps 10-15 10-15 10-15     Interval Training   Interval Training No No Yes     Equipment   --  BioStep     Comments   Level 4 for 3 minutes, Level 6 for 1 minute. Repeat for 10-15 minutes     Treadmill   MPH   2.8     Grade   0     Minutes   20     Recumbant Bike   Level '3 3 3     '$ RPM 50 50 50     Minutes 20 20  20     NuStep   Level '3 3 3     '$ Watts 50 50 50     Minutes '20 20 20     '$ Biostep-RELP   Level  6 6     Watts  40 40     Minutes  20 20        Discharge Exercise Prescription (Final Exercise Prescription Changes):     Exercise Prescription Changes - 08/02/15 1249    Exercise Review   Progression Yes   Response to Exercise   Blood Pressure (Admit) 130/68 mmHg   Blood Pressure (Exercise) 148/70 mmHg   Blood Pressure (Exit) 122/60 mmHg   Heart Rate (Admit) 61 bpm   Heart Rate (Exercise) 97 bpm   Heart Rate (Exit) 64 bpm   Rating of Perceived Exertion (Exercise) 15   Symptoms No new pain other than chronic back pain   Comments Dillard is starting to plateau on some of his exercise progressions. Progression will be slower now that he is working close to his full capacity on each machine. He can continuously exercise for the entire class. We will now use interval training to progress him with his exercise intensity.    Duration Progress to 30 minutes of continuous aerobic without signs/symptoms of physical distress   Intensity Rest + 30   Progression Continue progressive overload as per policy without  signs/symptoms or physical distress.   Resistance Training   Training Prescription Yes   Weight 3   Reps 10-15   Interval Training   Interval Training Yes   Equipment --  BioStep   Comments Level 4 for 3 minutes, Level 6 for 1 minute. Repeat for 10-15 minutes   Treadmill   MPH 2.8   Grade 0   Minutes 20   Recumbant Bike   Level 3   RPM 50   Minutes 20   NuStep   Level 3   Watts 50   Minutes 20   Biostep-RELP   Level 6   Watts 40   Minutes 20      Nutrition:  Target Goals: Understanding of nutrition guidelines, daily intake of sodium '1500mg'$ , cholesterol '200mg'$ , calories 30% from fat and 7% or less from saturated fats, daily to have 5 or more servings of fruits and vegetables.  Biometrics:     Pre Biometrics - 07/02/15 1447    Pre Biometrics   Waist Circumference 43 inches   Hip Circumference 38 inches   Waist to Hip Ratio 1.13 %       Nutrition Therapy Plan and Nutrition Goals:     Nutrition Therapy & Goals - 07/02/15 1451    Nutrition Therapy   Drug/Food Interactions Statins/Certain Fruits   Personal Nutrition Goals   Personal Goal #1 Cont. to eat heart healthy and eat to control his diabetes.    Comments Dantre reports that his wife already has him on a heart healthy and a diabetic eating plan. Vivan said he did not feel he needed to meet individually with the Cardiac Rehab Registered dietician.    Intervention Plan   Intervention Using nutrition plan and personal goals to gain a healthy nutrition lifestyle. Add exercise as prescribed.      Nutrition Discharge: Rate Your Plate Scores:   Nutrition Goals Re-Evaluation:   Psychosocial: Target Goals: Acknowledge presence or absence of depression, maximize coping skills, provide positive support system. Participant is able to verbalize types and ability to use techniques and skills needed for reducing stress and  depression.  Initial Review & Psychosocial Screening:     Initial Psych Review &  Screening - 07/02/15 1448    Family Dynamics   Good Support System? Yes   Comments Tre reports that his wife feeds him good food based on a diabetic diet and now a heart diet.    Barriers   Psychosocial barriers to participate in program There are no identifiable barriers or psychosocial needs.   Screening Interventions   Interventions Encouraged to exercise      Quality of Life Scores:     Quality of Life - 07/02/15 1502    Quality of Life Scores   Health/Function Pre 20.4 %   Socioeconomic Pre 26.43 %   Psych/Spiritual Pre 27 %   Family Pre 28.8 %   GLOBAL Pre 24.24 %      PHQ-9:     Recent Review Flowsheet Data    Depression screen [facility] 2/9 07/02/2015 06/21/2015   Decreased Interest 2 0   Down, Depressed, Hopeless 1 0   PHQ - 2 Score 3 0   Altered sleeping 0 -   Tired, decreased energy 2 -   Change in appetite 0 -   Feeling bad or failure about yourself  0 -   Trouble concentrating 0 -   Moving slowly or fidgety/restless 1 -   Suicidal thoughts 0 -   PHQ-9 Score 6 -   Difficult doing work/chores Somewhat difficult -      Psychosocial Evaluation and Intervention:     Psychosocial Evaluation - 08/01/15 1658    Psychosocial Evaluation & Interventions   Comments Met with Mr. Dunshee today for psychosocial evaluation reporting triple bypass on 10/12.  He has a strong support system with a spouse of 15 years and (2) adult daughters who live close by.  Mr. Judie Petit has diabetes and sleep apnea - but is sleeping fine and his appetite has returned recently.  He denies a history of depression or anxiety or curent symptoms.  He is typically in a positive mood and reports minimal stress in his life currently.  Mr. Judie Petit has goals to continue exercising without being tired, and to lose some more weight while in this program.  His spouse has reported positive benefits already seen since he started this program stating he has "more stamina now  than he has had in the past few years."   Counselor commended Mr. Judie Petit for his progress and his commitment to consistent exercise.       Psychosocial Re-Evaluation:   Vocational Rehabilitation: Provide vocational rehab assistance to qualifying candidates.   Vocational Rehab Evaluation & Intervention:     Vocational Rehab - 07/02/15 1350    Initial Vocational Rehab Evaluation & Intervention   Assessment shows need for Vocational Rehabilitation No      Education: Education Goals: Education classes will be provided on a weekly basis, covering required topics. Participant will state understanding/return demonstration of topics presented.  Learning Barriers/Preferences:   Education Topics: General Nutrition Guidelines/Fats and Fiber: -Group instruction provided by verbal, written material, models and posters to present the general guidelines for heart healthy nutrition. Gives an explanation and review of dietary fats and fiber.          Cardiac Rehab from 07/25/2015 in [facility] Cardiac and Pulmonary Rehab   Date  07/16/15   Educator  PI   Instruction Review Code  2- meets goals/outcomes      Controlling Sodium/Reading Food Labels: -Group verbal and written material supporting the discussion  of sodium use in heart healthy nutrition. Review and explanation with models, verbal and written materials for utilization of the food label.      Cardiac Rehab from 07/25/2015 in [facility] Cardiac and Pulmonary Rehab   Date  07/23/15   Educator  PI   Instruction Review Code  2- meets goals/outcomes      Exercise Physiology & Risk Factors: - Group verbal and written instruction with models to review the exercise physiology of the cardiovascular system and associated critical values. Details cardiovascular disease risk factors and the goals associated with each risk factor.   Aerobic Exercise & Resistance Training: - Gives group verbal and written discussion on the health impact of inactivity. On the components of aerobic and resistive  training programs and the benefits of this training and how to safely progress through these programs.   Flexibility, Balance, General Exercise Guidelines: - Provides group verbal and written instruction on the benefits of flexibility and balance training programs. Provides general exercise guidelines with specific guidelines to those with heart or lung disease. Demonstration and skill practice provided.   Stress Management: - Provides group verbal and written instruction about the health risks of elevated stress, cause of high stress, and healthy ways to reduce stress.   Depression: - Provides group verbal and written instruction on the correlation between heart/lung disease and depressed mood, treatment options, and the stigmas associated with seeking treatment.      Cardiac Rehab from 07/25/2015 in [facility] Cardiac and Pulmonary Rehab   Date  07/25/15   Educator  Juliann Pulse C   Instruction Review Code  2- meets goals/outcomes      Anatomy & Physiology of the Heart: - Group verbal and written instruction and models provide basic cardiac anatomy and physiology, with the coronary electrical and arterial systems. Review of: AMI, Angina, Valve disease, Heart Failure, Cardiac Arrhythmia, Pacemakers, and the ICD.   Cardiac Procedures: - Group verbal and written instruction and models to describe the testing methods done to diagnose heart disease. Reviews the outcomes of the test results. Describes the treatment choices: Medical Management, Angioplasty, or Coronary Bypass Surgery.   Cardiac Medications: - Group verbal and written instruction to review commonly prescribed medications for heart disease. Reviews the medication, class of the drug, and side effects. Includes the steps to properly store meds and maintain the prescription regimen.   Go Sex-Intimacy & Heart Disease, Get SMART - Goal Setting: - Group verbal and written instruction through game format to discuss heart disease and the return  to sexual intimacy. Provides group verbal and written material to discuss and apply goal setting through the application of the S.M.A.R.T. Method.   Other Matters of the Heart: - Provides group verbal, written materials and models to describe Heart Failure, Angina, Valve Disease, and Diabetes in the realm of heart disease. Includes description of the disease process and treatment options available to the cardiac patient.      Cardiac Rehab from 07/25/2015 in [facility] Cardiac and Pulmonary Rehab   Date  07/11/15   Educator  DW   Instruction Review Code  2- meets goals/outcomes      Exercise & Equipment Safety: - Individual verbal instruction and demonstration of equipment use and safety with use of the equipment.      Cardiac Rehab from 07/25/2015 in [facility] Cardiac and Pulmonary Rehab   Date  07/02/15   Educator  C. [place]   Instruction Review Code  1- partially meets, needs review/practice      Infection  Prevention: - Provides verbal and written material to individual with discussion of infection control including proper hand washing and proper equipment cleaning during exercise session.      Cardiac Rehab from 07/25/2015 in [facility] Cardiac and Pulmonary Rehab   Date  07/02/15   Educator  C. [place]   Instruction Review Code  1- partially meets, needs review/practice      Falls Prevention: - Provides verbal and written material to individual with discussion of falls prevention and safety.      Cardiac Rehab from 07/25/2015 in [facility] Cardiac and Pulmonary Rehab   Date  07/02/15   Educator  C. [place]   Instruction Review Code  2- meets goals/outcomes      Diabetes: - Individual verbal and written instruction to review signs/symptoms of diabetes, desired ranges of glucose level fasting, after meals and with exercise. Advice that pre and post exercise glucose checks will be done for 3 sessions at entry of program.      Cardiac Rehab from 07/25/2015 in [facility] Cardiac and Pulmonary  Rehab   Date  07/02/15   Educator  C. EnterkinRN   Instruction Review Code  2- meets goals/outcomes       Knowledge Questionnaire Score:     Knowledge Questionnaire Score - 07/02/15 1451    Knowledge Questionnaire Score   Pre Score 27      Personal Goals and Risk Factors at Admission:     Personal Goals and Risk Factors at Admission - 07/02/15 1447    Personal Goals and Risk Factors on Admission   Increase Aerobic Exercise and Physical Activity Yes   Intervention While in program, learn and follow the exercise prescription taught. Start at a low level workload and increase workload after able to maintain previous level for 30 minutes. Increase time before increasing intensity.  Goal time to achieve goal by 36 sessions.    Diabetes Yes   Goal Blood glucose control identified by blood glucose values, HgbA1C. Participant verbalizes understanding of the signs/symptoms of hyper/hypo glycemia, proper foot care and importance of medication and nutrition plan for blood glucose control.   Intervention Provide nutrition & aerobic exercise along with prescribed medications to achieve blood glucose in normal ranges: Fasting 65-99 mg/dL   Hypertension Yes   Goal Participant will see blood pressure controlled within the values of 140/80m/Hg or within value directed by their physician.   Intervention Provide nutrition & aerobic exercise along with prescribed medications to achieve BP 140/90 or less.   Lipids Yes   Goal Cholesterol controlled with medications as prescribed, with individualized exercise RX and with personalized nutrition plan. Value goals: LDL < '70mg'$ , HDL > '40mg'$ . Participant states understanding of desired cholesterol values and following prescriptions.   Intervention Provide nutrition & aerobic exercise along with prescribed medications to achieve LDL '70mg'$ , HDL >'40mg'$ .      Personal Goals and Risk Factors Review:      Goals and Risk Factor Review      07/09/15 1737 07/25/15  1737         Increase Aerobic Exercise and Physical Activity   Goals Progress/Improvement seen  No       Comments Today was first exsecise session.  Spoke with GDeforrestabout his overall goals and short term goal ssetting.  He does want to return to his golf game. He states that will be awhile before that happens. Also he wants to return to exercising 30 minutes 3 times a day and doing weights. Short term goal is  to attend class 3 days a week and follow the exerciseprescription that he is given.        Diabetes   Progress seen towards goals  Yes      Comments  Mohan  states he is changing his eating habits more fruit and vegs, less fried foods, decreased his starches, added more beans/legumes.      Hypertension   Progress seen toward goals  Yes      Comments  [place]  states he is changing his eating habits more fruit and vegs, less fried foods, decreased his starches, added more beans/legumes. He is keeping an eye on his sodium intake.      Abnormal Lipids   Progress seen towards goals  Yes      Comments  Ravindra  states he is changing his eating habits more fruit and vegs, less fried foods, decreased his starches, added more beans/legumes. He is progressing with his exercise program         Personal Goals Discharge (Final Personal Goals and Risk Factors Review):      Goals and Risk Factor Review - 07/25/15 1737    Diabetes   Progress seen towards goals Yes   Comments Eydan  states he is changing his eating habits more fruit and vegs, less fried foods, decreased his starches, added more beans/legumes.   Hypertension   Progress seen toward goals Yes   Comments Tegan  states he is changing his eating habits more fruit and vegs, less fried foods, decreased his starches, added more beans/legumes. He is keeping an eye on his sodium intake.   Abnormal Lipids   Progress seen towards goals Yes   Comments Artavius  states he is changing his eating habits more fruit and vegs, less fried foods,  decreased his starches, added more beans/legumes. He is progressing with his exercise program      ITP Comments:     ITP Comments      07/17/15 1318 08/12/15 1408         ITP Comments 30 day review preparation  Continue with ITP   New to program only a few visits completed Ready for 30 day review.  Continue with ITP         Comments:

## 2015-08-14 DIAGNOSIS — G4733 Obstructive sleep apnea (adult) (pediatric): Secondary | ICD-10-CM | POA: Diagnosis not present

## 2015-08-15 ENCOUNTER — Encounter: Payer: PPO | Attending: Interventional Cardiology

## 2015-08-15 DIAGNOSIS — G4733 Obstructive sleep apnea (adult) (pediatric): Secondary | ICD-10-CM | POA: Diagnosis not present

## 2015-08-15 DIAGNOSIS — Z951 Presence of aortocoronary bypass graft: Secondary | ICD-10-CM | POA: Insufficient documentation

## 2015-08-15 NOTE — Addendum Note (Signed)
Addended by: Lynford Humphrey on: 08/15/2015 11:40 AM   Modules accepted: Orders

## 2015-08-27 DIAGNOSIS — Z951 Presence of aortocoronary bypass graft: Secondary | ICD-10-CM | POA: Diagnosis not present

## 2015-08-27 NOTE — Progress Notes (Signed)
Daily Session Note  Patient Details  Name: Bradley [name] MRN: [id number] Date of Birth: [birth year] Referring Provider:  Lona Kettle, MD  Encounter Date: 08/27/2015  Check In:     Session Check In - 08/27/15 1612    Check-In   Staff Present Heath Lark, RN, BSN, CCRP;Renee Dillard [place], MS, ACSM CEP, Exercise Physiologist;Anson Peddie, BS, ACSM EP-C, Exercise Physiologist   ER physicians immediately available to respond to emergencies See telemetry face sheet for immediately available ER MD   Medication changes reported     No   Fall or balance concerns reported    No   Warm-up and Cool-down Performed on first and last piece of equipment   VAD Patient? No   Pain Assessment   Currently in Pain? No/denies           Exercise Prescription Changes - 08/27/15 1100    Exercise Review   Progression No  Absent since 08/09/15   Response to Exercise   Blood Pressure (Admit) 128/62 mmHg   Blood Pressure (Exercise) 138/76 mmHg   Blood Pressure (Exit) 124/60 mmHg   Heart Rate (Admit) 65 bpm   Heart Rate (Exercise) 93 bpm   Heart Rate (Exit) 64 bpm   Rating of Perceived Exertion (Exercise) 13   Symptoms No new pain other than chronic back pain   Comments Marios has been absent since 08/09/15. His workloads may need to be re-evaluated based on his current PA level.    Duration Progress to 30 minutes of continuous aerobic without signs/symptoms of physical distress   Intensity Rest + 30   Progression Continue progressive overload as per policy without signs/symptoms or physical distress.   Resistance Training   Training Prescription Yes   Weight 3   Reps 10-15   Interval Training   Interval Training Yes   Equipment --  BioStep   Comments Level 4 for 3 minutes, Level 6 for 1 minute. Repeat for 10-15 minutes   Treadmill   MPH 2.8   Grade 0   Minutes 20   Recumbant Bike   Level 3   RPM 50   Minutes 20   NuStep   Level 3   Watts 50   Minutes 20   Biostep-RELP   Level 6   Watts 40   Minutes 20      Goals Met:  Proper associated with RPD/PD & O2 Sat Exercise tolerated well No report of cardiac concerns or symptoms Strength training completed today  Goals Unmet:  Not Applicable  Goals Comments:    Dr. Emily Filbert is Medical Director for [facility].

## 2015-08-28 ENCOUNTER — Encounter: Payer: Self-pay | Admitting: Family

## 2015-08-29 ENCOUNTER — Encounter: Payer: PPO | Admitting: *Deleted

## 2015-08-29 DIAGNOSIS — Z85828 Personal history of other malignant neoplasm of skin: Secondary | ICD-10-CM | POA: Diagnosis not present

## 2015-08-29 DIAGNOSIS — L853 Xerosis cutis: Secondary | ICD-10-CM | POA: Diagnosis not present

## 2015-08-29 DIAGNOSIS — D0439 Carcinoma in situ of skin of other parts of face: Secondary | ICD-10-CM | POA: Diagnosis not present

## 2015-08-29 DIAGNOSIS — X32XXXA Exposure to sunlight, initial encounter: Secondary | ICD-10-CM | POA: Diagnosis not present

## 2015-08-29 DIAGNOSIS — L570 Actinic keratosis: Secondary | ICD-10-CM | POA: Diagnosis not present

## 2015-08-29 DIAGNOSIS — D485 Neoplasm of uncertain behavior of skin: Secondary | ICD-10-CM | POA: Diagnosis not present

## 2015-08-29 DIAGNOSIS — Z951 Presence of aortocoronary bypass graft: Secondary | ICD-10-CM

## 2015-08-29 NOTE — Progress Notes (Signed)
Cardiac Individual Treatment Plan  Patient Details  Name: JAEL KOSTICK MRN: [id number] Date of Birth: 17-Dec-1945 Referring Provider:  Belva Crome, MD  Initial Encounter Date:    Visit Diagnosis: S/P CABG x 3  Patient's Home Medications on Admission:  Current outpatient prescriptions:  .  Ascorbic Acid (VITAMIN C) 100 MG tablet, Take 100 mg by mouth daily., Disp: , Rfl:  .  aspirin EC 81 MG EC tablet, Take 1 tablet (81 mg total) by mouth daily., Disp: , Rfl:  .  atorvastatin (LIPITOR) 40 MG tablet, Take 40 mg by mouth daily.  , Disp: , Rfl:  .  Calcium Carbonate-Vitamin D (CALCIUM-D) 600-400 MG-UNIT TABS, Take 1 tablet by mouth daily. , Disp: , Rfl:  .  Cholecalciferol (D-3-5) 5000 UNITS capsule, Take 5,000 Units by mouth daily., Disp: , Rfl:  .  clopidogrel (PLAVIX) 75 MG tablet, Take 1 tablet (75 mg total) by mouth daily., Disp: 1 tablet, Rfl: 0 .  dexlansoprazole (DEXILANT) 60 MG capsule, Take 60 mg by mouth daily., Disp: , Rfl:  .  diltiazem (CARDIZEM CD) 300 MG 24 hr capsule, Take 1 capsule (300 mg total) by mouth daily., Disp: 30 capsule, Rfl: 5 .  glimepiride (AMARYL) 2 MG tablet, Take 2 mg by mouth daily before breakfast.  , Disp: , Rfl:  .  IRON, FERROUS GLUCONATE, PO, Take 40 mg by mouth 2 (two) times daily., Disp: , Rfl:  .  Liraglutide (VICTOZA Bryant), Inject 1.8 mg into the skin daily. , Disp: , Rfl:  .  metFORMIN (GLUCOPHAGE-XR) 500 MG 24 hr tablet, Take 2 tablets (1,000 mg total) by mouth 2 (two) times daily., Disp: , Rfl:  .  metoprolol (LOPRESSOR) 50 MG tablet, Take 1.5 tablets (75 mg total) by mouth 2 (two) times daily., Disp: 90 tablet, Rfl: 11 .  Multiple Vitamin (MULTIVITAMIN) capsule, Take 1 capsule by mouth daily.  , Disp: , Rfl:  .  traMADol (ULTRAM) 50 MG tablet, Take 1 tablet (50 mg total) by mouth every 6 (six) hours as needed for moderate pain., Disp: 30 tablet, Rfl: 0  Past Medical History: Past Medical History  Diagnosis Date  . Hyperlipidemia   .  Hypertension   . Diabetes mellitus age 70  . Peripheral vascular disease ([place])   . Arthritis   . Reflux esophagitis   . Stroke [facility]) 1987    Right brain stroke  . Neuropathy ([place]) 2013  . Osteoporosis 2013  . Sleep apnea     uses CPAP  . GERD (gastroesophageal reflux disease)   . H/O hiatal hernia   . Irritable bowel syndrome (IBS)   . Myocardial infarction [facility])     silent inferior MI; patient denies MI history (03/17/13)   . Anemia   . Carotid artery occlusion     a. Carotid US 10/16: RICA 60-79%, L CEA patent with 1-39% stenosis  . Coronary artery disease     a. Myoview 9/16:  EF 52%, inferior fixed defect consistent with diaphragmatic attenuation, intermediate risk secondary to poor exercise tolerance and symptoms during stress;  b. LHC 05/17/2015 90% mid LAD, 80% ost D2, 75% mid RCA, 35% prox RCA. >> S/p CABG  . Diverticulitis   . Wears glasses   . PAF (paroxysmal atrial fibrillation) ([place])     post CABG; Amiodarone stopped 2/2 wheezing;   . History of echocardiogram     a. Echo 9/16: GLS -15.2%, EF 23-76%, grade 1 diastolic dysfunction, normal wall motion, aortic sclerosis, dilated aortic  root 39 mm, atrial septal lipomatous hypertrophy    Tobacco Use: History  Smoking status  . Former Smoker -- 2.00 packs/day for 40 years  . Types: Cigarettes  . Quit date: 03/26/2009  Smokeless tobacco  . Never Used    Labs: Recent Review Flowsheet Data    Labs for ITP Cardiac and Pulmonary Rehab Latest Ref Rng 05/24/2015 05/24/2015 05/24/2015 05/31/2015 06/01/2015   Cholestrol 0 - 200 mg/dL - - - 83 82   LDLCALC 0 - 99 mg/dL - - - 36 26   HDL >40 mg/dL - - - 26(L) 24(L)   Trlycerides <150 mg/dL - - - 104 158(H)   PHART 7.350 - 7.450 - 7.297(L) - - -   PCO2ART 35.0 - 45.0 mmHg - 43.4 - - -   HCO3 20.0 - 24.0 mEq/L - 21.2 - - -   TCO2 0 - 100 mmol/L _0 - -   ACIDBASEDEF 0.0 - 2.0 mmol/L - 5.0(H) - - -   O2SAT - - 91.0 - - -       Exercise Target Goals:    Exercise  Program Goal: Individual exercise prescription set with THRR, safety & activity barriers. Participant demonstrates ability to understand and report RPE using BORG scale, to self-measure pulse accurately, and to acknowledge the importance of the exercise prescription.  Exercise Prescription Goal: Starting with aerobic activity 30 plus minutes a day, 3 days per week for initial exercise prescription. Provide home exercise prescription and guidelines that participant acknowledges understanding prior to discharge.  Activity Barriers & Risk Stratification:     Activity Barriers & Risk Stratification - 07/02/15 1348    Activity Barriers & Risk Stratification   Activity Barriers Back Problems   Risk Stratification High      6 Minute Walk:     6 Minute Walk      07/02/15 1444       6 Minute Walk   Phase Initial     Distance 1175 feet     Walk Time 6 minutes     Resting HR 98 bpm     Resting BP 138/62 mmHg     Max Ex. HR 102 bpm     Max Ex. BP 140/78 mmHg     RPE 12     Perceived Dyspnea  0     Symptoms No        Initial Exercise Prescription:     Initial Exercise Prescription - 07/02/15 1500    Date of Initial Exercise Prescription   Date 07/02/15   Treadmill   MPH 2.5   Grade 0   Minutes 15   Bike   Level 1   Minutes 15   Recumbant Bike   Level 2   Watts 30   Minutes 15   NuStep   Level 3   Watts 40   Minutes 15   Arm Ergometer   Level 1   Watts 10   Minutes 15   Arm/Foot Ergometer   Level 1   Watts 12   Minutes 15   Cybex   Level 2   RPM 40   Minutes 15   Recumbant Elliptical   Level 1   Watts 20   Minutes 15   Elliptical   Level 1   Speed 2.5   Minutes 5   REL-XR   Level 3   Watts 30   Minutes 15   T5 Nustep   Level 2   Watts 20  Minutes 15   Biostep-RELP   Level 3   Watts 30   Minutes 15   Prescription Details   Frequency (times per week) 3   Duration Progress to 30 minutes of continuous aerobic without signs/symptoms of  physical distress   Intensity   THRR REST +  30   Ratings of Perceived Exertion 11-15   Perceived Dyspnea 2-4   Progression Continue progressive overload as per policy without signs/symptoms or physical distress.   Resistance Training   Training Prescription Yes   Weight 2   Reps 10-15      Exercise Prescription Changes:     Exercise Prescription Changes      07/09/15 1439 07/18/15 1600 08/02/15 1249 08/27/15 1100     Exercise Review   Progression Yes Yes Yes No  Absent since 08/09/15    Response to Exercise   Blood Pressure (Admit) 112/72 mmHg  130/68 mmHg 128/62 mmHg    Blood Pressure (Exercise) 158/62 mmHg  148/70 mmHg 138/76 mmHg    Blood Pressure (Exit) 128/70 mmHg  122/60 mmHg 124/60 mmHg    Heart Rate (Admit) 82 bpm  61 bpm 65 bpm    Heart Rate (Exercise) 96 bpm  97 bpm 93 bpm    Heart Rate (Exit) 81 bpm  64 bpm 64 bpm    Rating of Perceived Exertion (Exercise) _0 Symptoms Back pain at times  No new pain other than chronic back pain No new pain other than chronic back pain    Comments First day of exercise! Patient was oriented to the gym and the equipment functions and settings. Procedures and policies of the gym were outlined and explained. The patient's individual exercise prescription and treatment plan were reviewed with them. All starting workloads were established based on the results of the functional testing  done at the initial intake visit. The plan for exercise progression was also introduced and progression will be customized based on the patient's performance and goals.   Octaviano is starting to plateau on some of his exercise progressions. Progression will be slower now that he is working close to his full capacity on each machine. He can continuously exercise for the entire class. We will now use interval training to progress him with his exercise intensity.  Malin has been absent since 08/09/15. His workloads may need to be re-evaluated based on his  current PA level.     Duration Progress to 30 minutes of continuous aerobic without signs/symptoms of physical distress Progress to 30 minutes of continuous aerobic without signs/symptoms of physical distress Progress to 30 minutes of continuous aerobic without signs/symptoms of physical distress Progress to 30 minutes of continuous aerobic without signs/symptoms of physical distress    Intensity Rest + 30 Rest + 30 Rest + 30 Rest + 30    Progression Continue progressive overload as per policy without signs/symptoms or physical distress. Continue progressive overload as per policy without signs/symptoms or physical distress. Continue progressive overload as per policy without signs/symptoms or physical distress. Continue progressive overload as per policy without signs/symptoms or physical distress.    Resistance Training   Training Prescription Yes Yes Yes Yes    Weight _1 Reps 10-15 10-15 10-15 10-15    Interval Training   Interval Training No No Yes Yes    Equipment   --  BioStep --  BioStep    Comments   Level 4 for 3 minutes, Level 6  for 1 minute. Repeat for 10-15 minutes Level 4 for 3 minutes, Level 6 for 1 minute. Repeat for 10-15 minutes    Treadmill   MPH   2.8 2.8    Grade   0 0    Minutes   20 20    Recumbant Bike   Level _0 RPM 50 50 50 50    Minutes _1 NuStep   Level _2 Watts 50 50 50 50    Minutes _3 Biostep-RELP   Level  _4 Watts  40 40 40    Minutes  _5 Discharge Exercise Prescription (Final Exercise Prescription Changes):     Exercise Prescription Changes - 08/27/15 1100    Exercise Review   Progression No  Absent since 08/09/15   Response to Exercise   Blood Pressure (Admit) 128/62 mmHg   Blood Pressure (Exercise) 138/76 mmHg   Blood Pressure (Exit) 124/60 mmHg   Heart Rate (Admit) 65 bpm   Heart Rate (Exercise) 93 bpm   Heart Rate (Exit) 64 bpm   Rating of Perceived Exertion (Exercise)  13   Symptoms No new pain other than chronic back pain   Comments Rudi has been absent since 08/09/15. His workloads may need to be re-evaluated based on his current PA level.    Duration Progress to 30 minutes of continuous aerobic without signs/symptoms of physical distress   Intensity Rest + 30   Progression Continue progressive overload as per policy without signs/symptoms or physical distress.   Resistance Training   Training Prescription Yes   Weight 3   Reps 10-15   Interval Training   Interval Training Yes   Equipment --  BioStep   Comments Level 4 for 3 minutes, Level 6 for 1 minute. Repeat for 10-15 minutes   Treadmill   MPH 2.8   Grade 0   Minutes 20   Recumbant Bike   Level 3   RPM 50   Minutes 20   NuStep   Level 3   Watts 50   Minutes 20   Biostep-RELP   Level 6   Watts 40   Minutes 20      Nutrition:  Target Goals: Understanding of nutrition guidelines, daily intake of sodium <1521m, cholesterol <2026m calories 30% from fat and 7% or less from saturated fats, daily to have 5 or more servings of fruits and vegetables.  Biometrics:     Pre Biometrics - 07/02/15 1447    Pre Biometrics   Waist Circumference 43 inches   Hip Circumference 38 inches   Waist to Hip Ratio 1.13 %       Nutrition Therapy Plan and Nutrition Goals:     Nutrition Therapy & Goals - 07/02/15 1451    Nutrition Therapy   Drug/Food Interactions Statins/Certain Fruits   Personal Nutrition Goals   Personal Goal #1 Cont. to eat heart healthy and eat to control his diabetes.    Comments GeOduseports that his wife already has him on a heart healthy and a diabetic eating plan. GeCordaid he did not feel he needed to meet individually with the Cardiac Rehab Registered dietician.    Intervention Plan   Intervention Using nutrition plan and personal goals to gain a healthy nutrition lifestyle. Add exercise as prescribed.  Nutrition Discharge: Rate Your Plate  Scores:   Nutrition Goals Re-Evaluation:     Nutrition Goals Re-Evaluation      08/29/15 1605           Personal Goal #1 Re-Evaluation   Goal Progress Seen Yes       Comments Damarri said that his wife had shoulder surgery 2 weeks ago and people are bringing apple pies and coconut pies to his house so it is hard to eat correctly at times but he says he usually eats healthy to control his blood sugars in the 100 range.           Psychosocial: Target Goals: Acknowledge presence or absence of depression, maximize coping skills, provide positive support system. Participant is able to verbalize types and ability to use techniques and skills needed for reducing stress and depression.  Initial Review & Psychosocial Screening:     Initial Psych Review & Screening - 07/02/15 [place]? Yes   Comments Isaah reports that his wife feeds him good food based on a diabetic diet and now a heart diet.    Barriers   Psychosocial barriers to participate in program There are no identifiable barriers or psychosocial needs.   Screening Interventions   Interventions Encouraged to exercise      Quality of Life Scores:     Quality of Life - 07/02/15 1502    Quality of Life Scores   Health/Function Pre 20.4 %   Socioeconomic Pre 26.43 %   Psych/Spiritual Pre 27 %   Family Pre 28.8 %   GLOBAL Pre 24.24 %      PHQ-9:     Recent Review Flowsheet Data    Depression screen [facility] 2/9 07/02/2015 06/21/2015   Decreased Interest 2 0   Down, Depressed, Hopeless 1 0   PHQ - 2 Score 3 0   Altered sleeping 0 -   Tired, decreased energy 2 -   Change in appetite 0 -   Feeling bad or failure about yourself  0 -   Trouble concentrating 0 -   Moving slowly or fidgety/restless 1 -   Suicidal thoughts 0 -   PHQ-9 Score 6 -   Difficult doing work/chores Somewhat difficult -      Psychosocial Evaluation and Intervention:     Psychosocial Evaluation - 08/01/15  1658    Psychosocial Evaluation & Interventions   Comments Met with Mr. Powe today for psychosocial evaluation reporting triple bypass on 10/12.  He has a strong support system with a spouse of 15 years and (2) adult daughters who live close by.  Mr. Jerilynn Mages has diabetes and sleep apnea - but is sleeping fine and his appetite has returned recently.  He denies a history of depression or anxiety or curent symptoms.  He is typically in a positive mood and reports minimal stress in his life currently.  Mr. Jerilynn Mages has goals to continue exercising without being tired, and to lose some more weight while in this program.  His spouse has reported positive benefits already seen since he started this program stating he has "more stamina now  than he has had in the past few years."  Counselor commended Mr. Jerilynn Mages for his progress and his commitment to consistent exercise.       Psychosocial Re-Evaluation:     Psychosocial Re-Evaluation      08/29/15 1608           Psychosocial Re-Evaluation  Interventions Encouraged to attend Cardiac Rehabilitation for the exercise       Comments Narayan has been having to take care of his wife who had shoulder surgery. People are brining food in but no always the healthiest.           Vocational Rehabilitation: Provide vocational rehab assistance to qualifying candidates.   Vocational Rehab Evaluation & Intervention:     Vocational Rehab - 07/02/15 1350    Initial Vocational Rehab Evaluation & Intervention   Assessment shows need for Vocational Rehabilitation No      Education: Education Goals: Education classes will be provided on a weekly basis, covering required topics. Participant will state understanding/return demonstration of topics presented.  Learning Barriers/Preferences:   Education Topics: General Nutrition Guidelines/Fats and Fiber: -Group instruction provided by verbal, written material, models and posters to present the general guidelines for heart  healthy nutrition. Gives an explanation and review of dietary fats and fiber.          Cardiac Rehab from 08/27/2015 in [facility] Cardiac and Pulmonary Rehab   Date  07/16/15   Educator  PI   Instruction Review Code  2- meets goals/outcomes      Controlling Sodium/Reading Food Labels: -Group verbal and written material supporting the discussion of sodium use in heart healthy nutrition. Review and explanation with models, verbal and written materials for utilization of the food label.      Cardiac Rehab from 08/27/2015 in [facility] Cardiac and Pulmonary Rehab   Date  07/23/15   Educator  PI   Instruction Review Code  2- meets goals/outcomes      Exercise Physiology & Risk Factors: - Group verbal and written instruction with models to review the exercise physiology of the cardiovascular system and associated critical values. Details cardiovascular disease risk factors and the goals associated with each risk factor.   Aerobic Exercise & Resistance Training: - Gives group verbal and written discussion on the health impact of inactivity. On the components of aerobic and resistive training programs and the benefits of this training and how to safely progress through these programs.      Cardiac Rehab from 08/27/2015 in [facility] Cardiac and Pulmonary Rehab   Date  08/27/15   Educator  RM   Instruction Review Code  2- meets goals/outcomes      Flexibility, Balance, General Exercise Guidelines: - Provides group verbal and written instruction on the benefits of flexibility and balance training programs. Provides general exercise guidelines with specific guidelines to those with heart or lung disease. Demonstration and skill practice provided.      Cardiac Rehab from 08/27/2015 in [facility] Cardiac and Pulmonary Rehab   Date  08/27/15   Educator  RM   Instruction Review Code  2- meets goals/outcomes      Stress Management: - Provides group verbal and written instruction about the health risks of elevated  stress, cause of high stress, and healthy ways to reduce stress.   Depression: - Provides group verbal and written instruction on the correlation between heart/lung disease and depressed mood, treatment options, and the stigmas associated with seeking treatment.      Cardiac Rehab from 08/27/2015 in [facility] Cardiac and Pulmonary Rehab   Date  07/25/15   Educator  Juliann Pulse C   Instruction Review Code  2- meets goals/outcomes      Anatomy & Physiology of the Heart: - Group verbal and written instruction and models provide basic cardiac anatomy and physiology, with the coronary electrical and arterial  systems. Review of: AMI, Angina, Valve disease, Heart Failure, Cardiac Arrhythmia, Pacemakers, and the ICD.   Cardiac Procedures: - Group verbal and written instruction and models to describe the testing methods done to diagnose heart disease. Reviews the outcomes of the test results. Describes the treatment choices: Medical Management, Angioplasty, or Coronary Bypass Surgery.   Cardiac Medications: - Group verbal and written instruction to review commonly prescribed medications for heart disease. Reviews the medication, class of the drug, and side effects. Includes the steps to properly store meds and maintain the prescription regimen.   Go Sex-Intimacy & Heart Disease, Get SMART - Goal Setting: - Group verbal and written instruction through game format to discuss heart disease and the return to sexual intimacy. Provides group verbal and written material to discuss and apply goal setting through the application of the S.M.A.R.T. Method.   Other Matters of the Heart: - Provides group verbal, written materials and models to describe Heart Failure, Angina, Valve Disease, and Diabetes in the realm of heart disease. Includes description of the disease process and treatment options available to the cardiac patient.      Cardiac Rehab from 08/27/2015 in [facility] Cardiac and Pulmonary Rehab   Date  07/11/15    Educator  DW   Instruction Review Code  2- meets goals/outcomes      Exercise & Equipment Safety: - Individual verbal instruction and demonstration of equipment use and safety with use of the equipment.      Cardiac Rehab from 08/27/2015 in [facility] Cardiac and Pulmonary Rehab   Date  07/02/15   Educator  C. EnterkinRN   Instruction Review Code  1- partially meets, needs review/practice      Infection Prevention: - Provides verbal and written material to individual with discussion of infection control including proper hand washing and proper equipment cleaning during exercise session.      Cardiac Rehab from 08/27/2015 in [facility] Cardiac and Pulmonary Rehab   Date  07/02/15   Educator  C. [place]   Instruction Review Code  1- partially meets, needs review/practice      Falls Prevention: - Provides verbal and written material to individual with discussion of falls prevention and safety.      Cardiac Rehab from 08/27/2015 in [place] [facility] Cardiac and Pulmonary Rehab   Date  07/02/15   Educator  C. Tidioute   Instruction Review Code  2- meets goals/outcomes      Diabetes: - Individual verbal and written instruction to review signs/symptoms of diabetes, desired ranges of glucose level fasting, after meals and with exercise. Advice that pre and post exercise glucose checks will be done for 3 sessions at entry of program.      Cardiac Rehab from 08/27/2015 in [facility] Cardiac and Pulmonary Rehab   Date  07/02/15   Educator  C. EnterkinRN   Instruction Review Code  2- meets goals/outcomes       Knowledge Questionnaire Score:     Knowledge Questionnaire Score - 07/02/15 1451    Knowledge Questionnaire Score   Pre Score 27      Personal Goals and Risk Factors at Admission:     Personal Goals and Risk Factors at Admission - 07/02/15 1447    Personal Goals and Risk Factors on Admission   Increase Aerobic Exercise and Physical Activity Yes   Intervention While in program, learn and follow  the exercise prescription taught. Start at a low level workload and increase workload after able to maintain previous level for 30 minutes. Increase  time before increasing intensity.  Goal time to achieve goal by 36 sessions.    Diabetes Yes   Goal Blood glucose control identified by blood glucose values, HgbA1C. Participant verbalizes understanding of the signs/symptoms of hyper/hypo glycemia, proper foot care and importance of medication and nutrition plan for blood glucose control.   Intervention Provide nutrition & aerobic exercise along with prescribed medications to achieve blood glucose in normal ranges: Fasting 65-99 mg/dL   Hypertension Yes   Goal Participant will see blood pressure controlled within the values of 140/39m/Hg or within value directed by their physician.   Intervention Provide nutrition & aerobic exercise along with prescribed medications to achieve BP 140/90 or less.   Lipids Yes   Goal Cholesterol controlled with medications as prescribed, with individualized exercise RX and with personalized nutrition plan. Value goals: LDL < 75m HDL > 4023mParticipant states understanding of desired cholesterol values and following prescriptions.   Intervention Provide nutrition & aerobic exercise along with prescribed medications to achieve LDL <30m50mDL >40mg27m   Personal Goals and Risk Factors Review:      Goals and Risk Factor Review      07/09/15 1737 07/25/15 1737 08/27/15 1151 08/29/15 1606     Increase Aerobic Exercise and Physical Activity   Goals Progress/Improvement seen  No  No     Comments Today was first exsecise session.  Spoke with GeorgOakleyt his overall goals and short term goal ssetting.  He does want to return to his golf game. He states that will be awhile before that happens. Also he wants to return to exercising 30 minutes 3 times a day and doing weights. Short term goal is to attend class 3 days a week and follow the exerciseprescription that he is  given.   GeorgDelvontebeen absent since 08/09/15. His exercise goals and rx. will need to be re-evaluated based on his current PA level.      Diabetes   Progress seen towards goals  Yes  Yes    Comments  GeorgHucktes he is changing his eating habits more fruit and vegs, less fried foods, decreased his starches, added more beans/legumes.  GeorgAnuj the past 2 weeks have been tough since his wife had shoulder surgery and she usually cooks healthy for him. He asked about our cafeteria and I said that they have green dots for healthy food.     Hypertension   Progress seen toward goals  Yes  Yes    Comments  GeorgJakintes he is changing his eating habits more fruit and vegs, less fried foods, decreased his starches, added more beans/legumes. He is keeping an eye on his sodium intake.  Blood pressure has been stable -see telemetry sheets.     Abnormal Lipids   Progress seen towards goals  Yes  Yes    Comments  GeorgBensentes he is changing his eating habits more fruit and vegs, less fried foods, decreased his starches, added more beans/legumes. He is progressing with his exercise program  NornaJerime Arifbeen trying to eat more fruits andvegetables even when they go out to eat.        Personal Goals Discharge (Final Personal Goals and Risk Factors Review):      Goals and Risk Factor Review - 08/29/15 1606    Diabetes   Progress seen towards goals Yes   Comments GeorgRoman the past 2 weeks have been tough since his wife had shoulder surgery  and she usually cooks healthy for him. He asked about our cafeteria and I said that they have green dots for healthy food.    Hypertension   Progress seen toward goals Yes   Comments Blood pressure has been stable -see telemetry sheets.    Abnormal Lipids   Progress seen towards goals Yes   Comments Kaulana Brindle has been trying to eat more fruits andvegetables even when they go out to eat.       ITP Comments:     ITP Comments      07/17/15  1318 08/12/15 1408         ITP Comments 30 day review preparation  Continue with ITP   New to program only a few visits completed Ready for 30 day review.  Continue with ITP         Comments: Ezrael is back after being out taking care of his wife who had shoulder surgery.

## 2015-08-29 NOTE — Progress Notes (Signed)
Daily Session Note  Patient Details  Name: GERVASE COLBERG MRN: [id number] Date of Birth: 18-Dec-1945 Referring Provider:  Belva Crome, MD  Encounter Date: 08/29/2015  Check In:     Session Check In - 08/29/15 1604    Check-In   Staff Present Gerlene Burdock, RN, BSN;Diane Joya Gaskins, RN, Drusilla Kanner, [place], ACSM CEP, Exercise Physiologist   ER physicians immediately available to respond to emergencies See telemetry face sheet for immediately available ER MD   Medication changes reported     No   Fall or balance concerns reported    No   Warm-up and Cool-down Performed on first and last piece of equipment   VAD Patient? No   Pain Assessment   Currently in Pain? No/denies         Goals Met:  Proper associated with RPD/PD & O2 Sat Exercise tolerated well  Goals Unmet:  Not Applicable  Goals Comments: Cordney has been out having to take care of his wife who had shoulder surgery. People are bringing food in and then he said it is hard since sometimes they go out to eat and he tries to eat vegetables.    Dr. Emily Filbert is Medical Director for [facility].

## 2015-09-03 ENCOUNTER — Telehealth: Payer: Self-pay | Admitting: Physician Assistant

## 2015-09-03 DIAGNOSIS — Z951 Presence of aortocoronary bypass graft: Secondary | ICD-10-CM

## 2015-09-03 NOTE — Progress Notes (Signed)
Daily Session Note  Patient Details  Name: Bradley [name] MRN: [id number] Date of Birth: March 10, 1946 Referring Provider:  Belva Crome, MD  Encounter Date: 09/03/2015  Check In:     Session Check In - 09/03/15 1615    Check-In   Staff Present Heath Lark, RN, BSN, CCRP;Kaylah Chiasson, RN, BSN;Steven Way, BS, ACSM EP-C, Exercise Physiologist   ER physicians immediately available to respond to emergencies See telemetry face sheet for immediately available ER MD   Medication changes reported     No   Fall or balance concerns reported    No   Warm-up and Cool-down Performed on first and last piece of equipment   VAD Patient? No   Pain Assessment   Currently in Pain? No/denies         Goals Met:  Proper associated with RPD/PD & O2 Sat  Goals Unmet:  Not Applicable  Goals Comments: [name] had runs of vtach. He is already wearing a holter monitor. Dr. Mallie Mussel Smith's office was called and on call PA called back. Faxed rhythm strips to office like he asked me to . [name] did not want to go to the [facility] since his wife just had shoulder surgery and he is having to take care of her. I instructed [name] to call 911 if he has any problems. [name] already has an appt with his primary care MD tomorrow am. [name] said he felt fine and he really wanted to go home. I informed him of the risks of runs of vtach turning into a lethal vfib etc.    Dr. Emily Filbert is Medical Director for [facility].

## 2015-09-03 NOTE — Telephone Encounter (Signed)
Called from [facility] reheb with patient had small episode of VT. The patient is asymtomatic. He is wearing holter monitor. Will fax scrip to office tomorrow for review. The patient denies nausea, vomiting, fever, chest pain, palpitations, shortness of breath, orthopnea, PND, dizziness, syncope, cough, congestion, abdominal pain, hematochezia, melena, lower extremity edema.  Will continue to monitor.   Bradley [name], [place]

## 2015-09-03 NOTE — Progress Notes (Signed)
Daily Session Note  Patient Details  Name: KIRBY CORTESE MRN: [id number] Date of Birth: 1945-12-29 Referring Provider:  Belva Crome, MD  Encounter Date: 09/03/2015  Check In:     Session Check In - 09/03/15 1615    Check-In   Staff Present Heath Lark, RN, BSN, CCRP;Carroll Enterkin, RN, BSN;Janyra Barillas, BS, ACSM EP-C, Exercise Physiologist   ER physicians immediately available to respond to emergencies See telemetry face sheet for immediately available ER MD   Medication changes reported     No   Fall or balance concerns reported    No   Warm-up and Cool-down Performed on first and last piece of equipment   VAD Patient? No   Pain Assessment   Currently in Pain? No/denies         Goals Met:  Proper associated with RPD/PD & O2 Sat Exercise tolerated well No report of cardiac concerns or symptoms Strength training completed today  Goals Unmet:  Not Applicable  Goals Comments:    Dr. Emily Filbert is Medical Director for [place].

## 2015-09-03 NOTE — Progress Notes (Signed)
Cardiac Individual Treatment Plan  Patient Details  Name: Bradley [name] MRN: [id number] Date of Birth: 17-Dec-1945 Referring Provider:  Belva Crome, MD  Initial Encounter Date:    Visit Diagnosis: S/P CABG x 3  Patient's Home Medications on Admission:  Current outpatient prescriptions:  .  Ascorbic Acid (VITAMIN C) 100 MG tablet, Take 100 mg by mouth daily., Disp: , Rfl:  .  aspirin EC 81 MG EC tablet, Take 1 tablet (81 mg total) by mouth daily., Disp: , Rfl:  .  atorvastatin (LIPITOR) 40 MG tablet, Take 40 mg by mouth daily.  , Disp: , Rfl:  .  Calcium Carbonate-Vitamin D (CALCIUM-D) 600-400 MG-UNIT TABS, Take 1 tablet by mouth daily. , Disp: , Rfl:  .  Cholecalciferol (D-3-5) 5000 UNITS capsule, Take 5,000 Units by mouth daily., Disp: , Rfl:  .  clopidogrel (PLAVIX) 75 MG tablet, Take 1 tablet (75 mg total) by mouth daily., Disp: 1 tablet, Rfl: 0 .  dexlansoprazole (DEXILANT) 60 MG capsule, Take 60 mg by mouth daily., Disp: , Rfl:  .  diltiazem (CARDIZEM CD) 300 MG 24 hr capsule, Take 1 capsule (300 mg total) by mouth daily., Disp: 30 capsule, Rfl: 5 .  glimepiride (AMARYL) 2 MG tablet, Take 2 mg by mouth daily before breakfast.  , Disp: , Rfl:  .  IRON, FERROUS GLUCONATE, PO, Take 40 mg by mouth 2 (two) times daily., Disp: , Rfl:  .  Liraglutide (VICTOZA Bryant), Inject 1.8 mg into the skin daily. , Disp: , Rfl:  .  metFORMIN (GLUCOPHAGE-XR) 500 MG 24 hr tablet, Take 2 tablets (1,000 mg total) by mouth 2 (two) times daily., Disp: , Rfl:  .  metoprolol (LOPRESSOR) 50 MG tablet, Take 1.5 tablets (75 mg total) by mouth 2 (two) times daily., Disp: 90 tablet, Rfl: 11 .  Multiple Vitamin (MULTIVITAMIN) capsule, Take 1 capsule by mouth daily.  , Disp: , Rfl:  .  traMADol (ULTRAM) 50 MG tablet, Take 1 tablet (50 mg total) by mouth every 6 (six) hours as needed for moderate pain., Disp: 30 tablet, Rfl: 0  Past Medical History: Past Medical History  Diagnosis Date  . Hyperlipidemia   .  Hypertension   . Diabetes mellitus age 70  . Peripheral vascular disease ([place])   . Arthritis   . Reflux esophagitis   . Stroke [facility]) 1987    Right brain stroke  . Neuropathy ([place]) 2013  . Osteoporosis 2013  . Sleep apnea     uses CPAP  . GERD (gastroesophageal reflux disease)   . H/O hiatal hernia   . Irritable bowel syndrome (IBS)   . Myocardial infarction [facility])     silent inferior MI; patient denies MI history (03/17/13)   . Anemia   . Carotid artery occlusion     a. Carotid US 10/16: RICA 60-79%, L CEA patent with 1-39% stenosis  . Coronary artery disease     a. Myoview 9/16:  EF 52%, inferior fixed defect consistent with diaphragmatic attenuation, intermediate risk secondary to poor exercise tolerance and symptoms during stress;  b. LHC 05/17/2015 90% mid LAD, 80% ost D2, 75% mid RCA, 35% prox RCA. >> S/p CABG  . Diverticulitis   . Wears glasses   . PAF (paroxysmal atrial fibrillation) ([place])     post CABG; Amiodarone stopped 2/2 wheezing;   . History of echocardiogram     a. Echo 9/16: GLS -15.2%, EF 23-76%, grade 1 diastolic dysfunction, normal wall motion, aortic sclerosis, dilated aortic  root 39 mm, atrial septal lipomatous hypertrophy    Tobacco Use: History  Smoking status  . Former Smoker -- 2.00 packs/day for 40 years  . Types: Cigarettes  . Quit date: 03/26/2009  Smokeless tobacco  . Never Used    Labs: Recent Review Flowsheet Data    Labs for ITP Cardiac and Pulmonary Rehab Latest Ref Rng 05/24/2015 05/24/2015 05/24/2015 05/31/2015 06/01/2015   Cholestrol 0 - 200 mg/dL - - - 83 82   LDLCALC 0 - 99 mg/dL - - - 36 26   HDL >40 mg/dL - - - 26(L) 24(L)   Trlycerides <150 mg/dL - - - 104 158(H)   PHART 7.350 - 7.450 - 7.297(L) - - -   PCO2ART 35.0 - 45.0 mmHg - 43.4 - - -   HCO3 20.0 - 24.0 mEq/L - 21.2 - - -   TCO2 0 - 100 mmol/L _0 - -   ACIDBASEDEF 0.0 - 2.0 mmol/L - 5.0(H) - - -   O2SAT - - 91.0 - - -       Exercise Target Goals:    Exercise  Program Goal: Individual exercise prescription set with THRR, safety & activity barriers. Participant demonstrates ability to understand and report RPE using BORG scale, to self-measure pulse accurately, and to acknowledge the importance of the exercise prescription.  Exercise Prescription Goal: Starting with aerobic activity 30 plus minutes a day, 3 days per week for initial exercise prescription. Provide home exercise prescription and guidelines that participant acknowledges understanding prior to discharge.  Activity Barriers & Risk Stratification:     Activity Barriers & Risk Stratification - 07/02/15 1348    Activity Barriers & Risk Stratification   Activity Barriers Back Problems   Risk Stratification High      6 Minute Walk:     6 Minute Walk      07/02/15 1444       6 Minute Walk   Phase Initial     Distance 1175 feet     Walk Time 6 minutes     Resting HR 98 bpm     Resting BP 138/62 mmHg     Max Ex. HR 102 bpm     Max Ex. BP 140/78 mmHg     RPE 12     Perceived Dyspnea  0     Symptoms No        Initial Exercise Prescription:     Initial Exercise Prescription - 07/02/15 1500    Date of Initial Exercise Prescription   Date 07/02/15   Treadmill   MPH 2.5   Grade 0   Minutes 15   Bike   Level 1   Minutes 15   Recumbant Bike   Level 2   Watts 30   Minutes 15   NuStep   Level 3   Watts 40   Minutes 15   Arm Ergometer   Level 1   Watts 10   Minutes 15   Arm/Foot Ergometer   Level 1   Watts 12   Minutes 15   Cybex   Level 2   RPM 40   Minutes 15   Recumbant Elliptical   Level 1   Watts 20   Minutes 15   Elliptical   Level 1   Speed 2.5   Minutes 5   REL-XR   Level 3   Watts 30   Minutes 15   T5 Nustep   Level 2   Watts 20  Minutes 15   Biostep-RELP   Level 3   Watts 30   Minutes 15   Prescription Details   Frequency (times per week) 3   Duration Progress to 30 minutes of continuous aerobic without signs/symptoms of  physical distress   Intensity   THRR REST +  30   Ratings of Perceived Exertion 11-15   Perceived Dyspnea 2-4   Progression Continue progressive overload as per policy without signs/symptoms or physical distress.   Resistance Training   Training Prescription Yes   Weight 2   Reps 10-15      Exercise Prescription Changes:     Exercise Prescription Changes      07/09/15 1439 07/18/15 1600 08/02/15 1249 08/27/15 1100     Exercise Review   Progression Yes Yes Yes No  Absent since 08/09/15    Response to Exercise   Blood Pressure (Admit) 112/72 mmHg  130/68 mmHg 128/62 mmHg    Blood Pressure (Exercise) 158/62 mmHg  148/70 mmHg 138/76 mmHg    Blood Pressure (Exit) 128/70 mmHg  122/60 mmHg 124/60 mmHg    Heart Rate (Admit) 82 bpm  61 bpm 65 bpm    Heart Rate (Exercise) 96 bpm  97 bpm 93 bpm    Heart Rate (Exit) 81 bpm  64 bpm 64 bpm    Rating of Perceived Exertion (Exercise) _0 Symptoms Back pain at times  No new pain other than chronic back pain No new pain other than chronic back pain    Comments First day of exercise! Patient was oriented to the gym and the equipment functions and settings. Procedures and policies of the gym were outlined and explained. The patient's individual exercise prescription and treatment plan were reviewed with them. All starting workloads were established based on the results of the functional testing  done at the initial intake visit. The plan for exercise progression was also introduced and progression will be customized based on the patient's performance and goals.   Bradley [name] is starting to plateau on some of his exercise progressions. Progression will be slower now that he is working close to his full capacity on each machine. He can continuously exercise for the entire class. We will now use interval training to progress him with his exercise intensity.  Bradley [name] has been absent since 08/09/15. His workloads may need to be re-evaluated based on his  current PA level.     Duration Progress to 30 minutes of continuous aerobic without signs/symptoms of physical distress Progress to 30 minutes of continuous aerobic without signs/symptoms of physical distress Progress to 30 minutes of continuous aerobic without signs/symptoms of physical distress Progress to 30 minutes of continuous aerobic without signs/symptoms of physical distress    Intensity Rest + 30 Rest + 30 Rest + 30 Rest + 30    Progression Continue progressive overload as per policy without signs/symptoms or physical distress. Continue progressive overload as per policy without signs/symptoms or physical distress. Continue progressive overload as per policy without signs/symptoms or physical distress. Continue progressive overload as per policy without signs/symptoms or physical distress.    Resistance Training   Training Prescription Yes Yes Yes Yes    Weight _1 Reps 10-15 10-15 10-15 10-15    Interval Training   Interval Training No No Yes Yes    Equipment   --  BioStep --  BioStep    Comments   Level 4 for 3 minutes, Level 6  for 1 minute. Repeat for 10-15 minutes Level 4 for 3 minutes, Level 6 for 1 minute. Repeat for 10-15 minutes    Treadmill   MPH   2.8 2.8    Grade   0 0    Minutes   20 20    Recumbant Bike   Level _0 RPM 50 50 50 50    Minutes _1 NuStep   Level _2 Watts 50 50 50 50    Minutes _3 Biostep-RELP   Level  _4 Watts  40 40 40    Minutes  _5 Discharge Exercise Prescription (Final Exercise Prescription Changes):     Exercise Prescription Changes - 08/27/15 1100    Exercise Review   Progression No  Absent since 08/09/15   Response to Exercise   Blood Pressure (Admit) 128/62 mmHg   Blood Pressure (Exercise) 138/76 mmHg   Blood Pressure (Exit) 124/60 mmHg   Heart Rate (Admit) 65 bpm   Heart Rate (Exercise) 93 bpm   Heart Rate (Exit) 64 bpm   Rating of Perceived Exertion (Exercise)  13   Symptoms No new pain other than chronic back pain   Comments Bradley [name] has been absent since 08/09/15. His workloads may need to be re-evaluated based on his current PA level.    Duration Progress to 30 minutes of continuous aerobic without signs/symptoms of physical distress   Intensity Rest + 30   Progression Continue progressive overload as per policy without signs/symptoms or physical distress.   Resistance Training   Training Prescription Yes   Weight 3   Reps 10-15   Interval Training   Interval Training Yes   Equipment --  BioStep   Comments Level 4 for 3 minutes, Level 6 for 1 minute. Repeat for 10-15 minutes   Treadmill   MPH 2.8   Grade 0   Minutes 20   Recumbant Bike   Level 3   RPM 50   Minutes 20   NuStep   Level 3   Watts 50   Minutes 20   Biostep-RELP   Level 6   Watts 40   Minutes 20      Nutrition:  Target Goals: Understanding of nutrition guidelines, daily intake of sodium <1521m, cholesterol <2026m calories 30% from fat and 7% or less from saturated fats, daily to have 5 or more servings of fruits and vegetables.  Biometrics:     Pre Biometrics - 07/02/15 1447    Pre Biometrics   Waist Circumference 43 inches   Hip Circumference 38 inches   Waist to Hip Ratio 1.13 %       Nutrition Therapy Plan and Nutrition Goals:     Nutrition Therapy & Goals - 07/02/15 1451    Nutrition Therapy   Drug/Food Interactions Statins/Certain Fruits   Personal Nutrition Goals   Personal Goal #1 Cont. to eat heart healthy and eat to control his diabetes.    Comments Bradley [name] that his wife already has him on a heart healthy and a diabetic eating plan. Bradley [name] he did not feel he needed to meet individually with the Cardiac Rehab Registered dietician.    Intervention Plan   Intervention Using nutrition plan and personal goals to gain a healthy nutrition lifestyle. Add exercise as prescribed.  Nutrition Discharge: Rate Your Plate  Scores:   Nutrition Goals Re-Evaluation:     Nutrition Goals Re-Evaluation      08/29/15 1605           Personal Goal #1 Re-Evaluation   Goal Progress Seen Yes       Comments Bradley [name] said that his wife had shoulder surgery 2 weeks ago and people are bringing apple pies and coconut pies to his house so it is hard to eat correctly at times but he says he usually eats healthy to control his blood sugars in the 100 range.           Psychosocial: Target Goals: Acknowledge presence or absence of depression, maximize coping skills, provide positive support system. Participant is able to verbalize types and ability to use techniques and skills needed for reducing stress and depression.  Initial Review & Psychosocial Screening:     Initial Psych Review & Screening - 07/02/15 [place]? Yes   Comments Bradley [name] reports that his wife feeds him good food based on a diabetic diet and now a heart diet.    Barriers   Psychosocial barriers to participate in program There are no identifiable barriers or psychosocial needs.   Screening Interventions   Interventions Encouraged to exercise      Quality of Life Scores:     Quality of Life - 07/02/15 1502    Quality of Life Scores   Health/Function Pre 20.4 %   Socioeconomic Pre 26.43 %   Psych/Spiritual Pre 27 %   Family Pre 28.8 %   GLOBAL Pre 24.24 %      PHQ-9:     Recent Review Flowsheet Data    Depression screen [facility] 2/9 07/02/2015 06/21/2015   Decreased Interest 2 0   Down, Depressed, Hopeless 1 0   PHQ - 2 Score 3 0   Altered sleeping 0 -   Tired, decreased energy 2 -   Change in appetite 0 -   Feeling bad or failure about yourself  0 -   Trouble concentrating 0 -   Moving slowly or fidgety/restless 1 -   Suicidal thoughts 0 -   PHQ-9 Score 6 -   Difficult doing work/chores Somewhat difficult -      Psychosocial Evaluation and Intervention:     Psychosocial Evaluation - 08/01/15  1658    Psychosocial Evaluation & Interventions   Comments Met with Bradley [name] today for psychosocial evaluation reporting triple bypass on 10/12.  He has a strong support system with a spouse of 15 years and (2) adult daughters who live close by.  Bradley [name] has diabetes and sleep apnea - but is sleeping fine and his appetite has returned recently.  He denies a history of depression or anxiety or curent symptoms.  He is typically in a positive mood and reports minimal stress in his life currently.  Bradley [name] has goals to continue exercising without being tired, and to lose some more weight while in this program.  His spouse has reported positive benefits already seen since he started this program stating he has "more stamina now  than he has had in the past few years."  Counselor commended Bradley [name] for his progress and his commitment to consistent exercise.       Psychosocial Re-Evaluation:     Psychosocial Re-Evaluation      08/29/15 1608           Psychosocial Re-Evaluation  Interventions Encouraged to attend Cardiac Rehabilitation for the exercise       Comments Bradley [name] has been having to take care of his wife who had shoulder surgery. People are brining food in but no always the healthiest.           Vocational Rehabilitation: Provide vocational rehab assistance to qualifying candidates.   Vocational Rehab Evaluation & Intervention:     Vocational Rehab - 07/02/15 1350    Initial Vocational Rehab Evaluation & Intervention   Assessment shows need for Vocational Rehabilitation No      Education: Education Goals: Education classes will be provided on a weekly basis, covering required topics. Participant will state understanding/return demonstration of topics presented.  Learning Barriers/Preferences:   Education Topics: General Nutrition Guidelines/Fats and Fiber: -Group instruction provided by verbal, written material, models and posters to present the general guidelines for heart  healthy nutrition. Gives an explanation and review of dietary fats and fiber.          Cardiac Rehab from 09/03/2015 in [facility] Cardiac and Pulmonary Rehab   Date  07/16/15   Educator  PI   Instruction Review Code  2- meets goals/outcomes      Controlling Sodium/Reading Food Labels: -Group verbal and written material supporting the discussion of sodium use in heart healthy nutrition. Review and explanation with models, verbal and written materials for utilization of the food label.      Cardiac Rehab from 09/03/2015 in [facility] Cardiac and Pulmonary Rehab   Date  07/23/15   Educator  PI   Instruction Review Code  2- meets goals/outcomes      Exercise Physiology & Risk Factors: - Group verbal and written instruction with models to review the exercise physiology of the cardiovascular system and associated critical values. Details cardiovascular disease risk factors and the goals associated with each risk factor.   Aerobic Exercise & Resistance Training: - Gives group verbal and written discussion on the health impact of inactivity. On the components of aerobic and resistive training programs and the benefits of this training and how to safely progress through these programs.      Cardiac Rehab from 09/03/2015 in [facility] Cardiac and Pulmonary Rehab   Date  08/27/15   Educator  RM   Instruction Review Code  2- meets goals/outcomes      Flexibility, Balance, General Exercise Guidelines: - Provides group verbal and written instruction on the benefits of flexibility and balance training programs. Provides general exercise guidelines with specific guidelines to those with heart or lung disease. Demonstration and skill practice provided.      Cardiac Rehab from 09/03/2015 in [facility] Cardiac and Pulmonary Rehab   Date  08/27/15   Educator  RM   Instruction Review Code  2- meets goals/outcomes      Stress Management: - Provides group verbal and written instruction about the health risks of elevated  stress, cause of high stress, and healthy ways to reduce stress.   Depression: - Provides group verbal and written instruction on the correlation between heart/lung disease and depressed mood, treatment options, and the stigmas associated with seeking treatment.      Cardiac Rehab from 09/03/2015 in [facility] Cardiac and Pulmonary Rehab   Date  07/25/15   Educator  Juliann Pulse C   Instruction Review Code  2- meets goals/outcomes      Anatomy & Physiology of the Heart: - Group verbal and written instruction and models provide basic cardiac anatomy and physiology, with the coronary electrical and arterial  systems. Review of: AMI, Angina, Valve disease, Heart Failure, Cardiac Arrhythmia, Pacemakers, and the ICD.      Cardiac Rehab from 09/03/2015 in [facility] Cardiac and Pulmonary Rehab   Date  09/03/15   Educator  SB   Instruction Review Code  2- meets goals/outcomes      Cardiac Procedures: - Group verbal and written instruction and models to describe the testing methods done to diagnose heart disease. Reviews the outcomes of the test results. Describes the treatment choices: Medical Management, Angioplasty, or Coronary Bypass Surgery.   Cardiac Medications: - Group verbal and written instruction to review commonly prescribed medications for heart disease. Reviews the medication, class of the drug, and side effects. Includes the steps to properly store meds and maintain the prescription regimen.   Go Sex-Intimacy & Heart Disease, Get SMART - Goal Setting: - Group verbal and written instruction through game format to discuss heart disease and the return to sexual intimacy. Provides group verbal and written material to discuss and apply goal setting through the application of the S.M.A.R.T. Method.   Other Matters of the Heart: - Provides group verbal, written materials and models to describe Heart Failure, Angina, Valve Disease, and Diabetes in the realm of heart disease. Includes description of the  disease process and treatment options available to the cardiac patient.      Cardiac Rehab from 09/03/2015 in [facility] Cardiac and Pulmonary Rehab   Date  07/11/15   Educator  DW   Instruction Review Code  2- meets goals/outcomes      Exercise & Equipment Safety: - Individual verbal instruction and demonstration of equipment use and safety with use of the equipment.      Cardiac Rehab from 09/03/2015 in [facility] Cardiac and Pulmonary Rehab   Date  07/02/15   Educator  C. EnterkinRN   Instruction Review Code  1- partially meets, needs review/practice      Infection Prevention: - Provides verbal and written material to individual with discussion of infection control including proper hand washing and proper equipment cleaning during exercise session.      Cardiac Rehab from 09/03/2015 in [facility] Cardiac and Pulmonary Rehab   Date  07/02/15   Educator  C. Alex   Instruction Review Code  1- partially meets, needs review/practice      Falls Prevention: - Provides verbal and written material to individual with discussion of falls prevention and safety.      Cardiac Rehab from 09/03/2015 in [facility] Cardiac and Pulmonary Rehab   Date  07/02/15   Educator  C. [place]   Instruction Review Code  2- meets goals/outcomes      Diabetes: - Individual verbal and written instruction to review signs/symptoms of diabetes, desired ranges of glucose level fasting, after meals and with exercise. Advice that pre and post exercise glucose checks will be done for 3 sessions at entry of program.      Cardiac Rehab from 09/03/2015 in [facility] Cardiac and Pulmonary Rehab   Date  07/02/15   Educator  C. EnterkinRN   Instruction Review Code  2- meets goals/outcomes       Knowledge Questionnaire Score:     Knowledge Questionnaire Score - 07/02/15 1451    Knowledge Questionnaire Score   Pre Score 27      Personal Goals and Risk Factors at Admission:     Personal Goals and Risk Factors at Admission -  07/02/15 1447    Personal Goals and Risk Factors on Admission   Increase Aerobic  Exercise and Physical Activity Yes   Intervention While in program, learn and follow the exercise prescription taught. Start at a low level workload and increase workload after able to maintain previous level for 30 minutes. Increase time before increasing intensity.  Goal time to achieve goal by 36 sessions.    Diabetes Yes   Goal Blood glucose control identified by blood glucose values, HgbA1C. Participant verbalizes understanding of the signs/symptoms of hyper/hypo glycemia, proper foot care and importance of medication and nutrition plan for blood glucose control.   Intervention Provide nutrition & aerobic exercise along with prescribed medications to achieve blood glucose in normal ranges: Fasting 65-99 mg/dL   Hypertension Yes   Goal Participant will see blood pressure controlled within the values of 140/68m/Hg or within value directed by their physician.   Intervention Provide nutrition & aerobic exercise along with prescribed medications to achieve BP 140/90 or less.   Lipids Yes   Goal Cholesterol controlled with medications as prescribed, with individualized exercise RX and with personalized nutrition plan. Value goals: LDL < 739m HDL > 4010mParticipant states understanding of desired cholesterol values and following prescriptions.   Intervention Provide nutrition & aerobic exercise along with prescribed medications to achieve LDL <54m49mDL >40mg57m   Personal Goals and Risk Factors Review:      Goals and Risk Factor Review      07/09/15 1737 07/25/15 1737 08/27/15 1151 08/29/15 1606     Increase Aerobic Exercise and Physical Activity   Goals Progress/Improvement seen  No  No     Comments Today was first exsecise session.  Spoke with Bradley [name] his overall goals and short term goal ssetting.  He does want to return to his golf game. He states that will be awhile before that happens. Also he  wants to return to exercising 30 minutes 3 times a day and doing weights. Short term goal is to attend class 3 days a week and follow the exerciseprescription that he is given.   Bradley [name] absent since 08/09/15. His exercise goals and rx. will need to be re-evaluated based on his current PA level.      Diabetes   Progress seen towards goals  Yes  Yes    Comments  Bradley [name] he is changing his eating habits more fruit and vegs, less fried foods, decreased his starches, added more beans/legumes.  Bradley [name] the past 2 weeks have been tough since his wife had shoulder surgery and she usually cooks healthy for him. He asked about our cafeteria and I said that they have green dots for healthy food.     Hypertension   Progress seen toward goals  Yes  Yes    Comments  GeorgRogantes he is changing his eating habits more fruit and vegs, less fried foods, decreased his starches, added more beans/legumes. He is keeping an eye on his sodium intake.  Blood pressure has been stable -see telemetry sheets.     Abnormal Lipids   Progress seen towards goals  Yes  Yes    Comments  GeorgEmidiotes he is changing his eating habits more fruit and vegs, less fried foods, decreased his starches, added more beans/legumes. He is progressing with his exercise program  NornaIrene Mitchambeen trying to eat more fruits andvegetables even when they go out to eat.        Personal Goals Discharge (Final Personal Goals and Risk Factors Review):  Goals and Risk Factor Review - 08/29/15 1606    Diabetes   Progress seen towards goals Yes   Comments Arkel said the past 2 weeks have been tough since his wife had shoulder surgery and she usually cooks healthy for him. He asked about our cafeteria and I said that they have green dots for healthy food.    Hypertension   Progress seen toward goals Yes   Comments Blood pressure has been stable -see telemetry sheets.    Abnormal Lipids   Progress seen towards goals Yes    Comments Jasir Rother has been trying to eat more fruits andvegetables even when they go out to eat.       ITP Comments:     ITP Comments      07/17/15 1318 08/12/15 1408         ITP Comments 30 day review preparation  Continue with ITP   New to program only a few visits completed Ready for 30 day review.  Continue with ITP         Comments: Darlene had runs of vtach. He is already wearing a holter monitor. Dr. Mallie Mussel Smith's office was called and on call PA called back. Faxed rhythm strips to office like he asked me to . Bradley [name] did not want to go to the [facility] since his wife just had shoulder surgery and he is having to take care of her. I instructed Bradley [name] to call 911 if he has any problems. Bradley [name] already has an appt with his primary care MD tomorrow am. Bradley [name] said he felt fine and he really wanted to go home. I informed him of the risks of runs of vtach turning into a lethal vfib etc. Bradley [name]'s blood pressure stable at end 128/70 and he said he felt fine and really wanted to go home.

## 2015-09-04 ENCOUNTER — Telehealth: Payer: Self-pay | Admitting: Interventional Cardiology

## 2015-09-04 DIAGNOSIS — M545 Low back pain: Secondary | ICD-10-CM | POA: Diagnosis not present

## 2015-09-04 DIAGNOSIS — E291 Testicular hypofunction: Secondary | ICD-10-CM | POA: Diagnosis not present

## 2015-09-04 DIAGNOSIS — E1142 Type 2 diabetes mellitus with diabetic polyneuropathy: Secondary | ICD-10-CM | POA: Diagnosis not present

## 2015-09-04 NOTE — Telephone Encounter (Signed)
Pt and pt wife aware. Dr.Smith has reviewed the strips from [facility] and has interpreted that the strips so artifact and undetermined BBB. Pt is asymptomatic. Adv pt that per Dr.Smith there is no action required. We will fwd Dr.Smith's response to [facility] cardiac rehab. Pt should complete his event monitor as planned, we will call him when the results are available. Pt dav to call the office if cardiac symptoms develop. Pt verbalized understanding.

## 2015-09-04 NOTE — Telephone Encounter (Signed)
Returned pt call. Pt sts that he was told he an episode of SVT while at [facility] cardiac rehab yesterday. Pt was asymptomatic, he was able to exercise with out incident. Pt was monitored ane released to go home Pt was wearing his event monitor at the time. We have not received notice from the monitor co. Pt sts that he is doing well today, he has no complaints. Pt sts that he does not feel he has had any reoccurrence of svt. Adv pt that we have received the strips from [facility] cardiac rehab. I will have Dr.Smith review them and call back with his recommendation. Pt agreed with plan and verbalized understanding

## 2015-09-04 NOTE — Telephone Encounter (Signed)
New message     Patient calling C/O episode last night - went into Afib.

## 2015-09-05 ENCOUNTER — Encounter: Payer: Self-pay | Admitting: Family

## 2015-09-05 ENCOUNTER — Ambulatory Visit (INDEPENDENT_AMBULATORY_CARE_PROVIDER_SITE_OTHER): Payer: PPO | Admitting: Family

## 2015-09-05 ENCOUNTER — Ambulatory Visit (HOSPITAL_COMMUNITY)
Admission: RE | Admit: 2015-09-05 | Discharge: 2015-09-05 | Disposition: A | Discharge status: Home / Self Care | Payer: PPO | Source: Ambulatory Visit | Attending: Family | Admitting: Family

## 2015-09-05 ENCOUNTER — Encounter: Payer: PPO | Admitting: *Deleted

## 2015-09-05 ENCOUNTER — Ambulatory Visit (INDEPENDENT_AMBULATORY_CARE_PROVIDER_SITE_OTHER)
Admission: RE | Admit: 2015-09-05 | Discharge: 2015-09-05 | Disposition: A | Discharge status: Home / Self Care | Payer: PPO | Source: Ambulatory Visit | Attending: Family | Admitting: Family

## 2015-09-05 VITALS — BP 131/78 | HR 70 | Temp 96.8°F | Resp 16 | Ht 72.0 in | Wt 199.0 lb

## 2015-09-05 DIAGNOSIS — I739 Peripheral vascular disease, unspecified: Secondary | ICD-10-CM

## 2015-09-05 DIAGNOSIS — E119 Type 2 diabetes mellitus without complications: Secondary | ICD-10-CM | POA: Insufficient documentation

## 2015-09-05 DIAGNOSIS — I6521 Occlusion and stenosis of right carotid artery: Secondary | ICD-10-CM | POA: Diagnosis not present

## 2015-09-05 DIAGNOSIS — I779 Disorder of arteries and arterioles, unspecified: Secondary | ICD-10-CM | POA: Insufficient documentation

## 2015-09-05 DIAGNOSIS — I10 Essential (primary) hypertension: Secondary | ICD-10-CM | POA: Diagnosis not present

## 2015-09-05 DIAGNOSIS — Z9889 Other specified postprocedural states: Secondary | ICD-10-CM

## 2015-09-05 DIAGNOSIS — Z48812 Encounter for surgical aftercare following surgery on the circulatory system: Secondary | ICD-10-CM | POA: Diagnosis not present

## 2015-09-05 DIAGNOSIS — Z951 Presence of aortocoronary bypass graft: Secondary | ICD-10-CM | POA: Diagnosis not present

## 2015-09-05 DIAGNOSIS — E785 Hyperlipidemia, unspecified: Secondary | ICD-10-CM | POA: Diagnosis not present

## 2015-09-05 NOTE — Progress Notes (Signed)
Filed Vitals:   09/05/15 1307 09/05/15 1310 09/05/15 1316  BP: 145/81 109/76 131/78  Pulse: 73 72 70  Temp:  96.8 F (36 C)   TempSrc:  Oral   Resp:  16   Height:  6' (1.829 m)   Weight:  199 lb (90.266 kg)   SpO2:  97%

## 2015-09-05 NOTE — Progress Notes (Signed)
[facility] [place] HISTORY AND PHYSICAL   MRN : [id number]  History of Present Illness:   Bradley [name] is a 70 y.o. male patient of Dr. Donnetta Hutching returns today for followup of his diffuse peripheral vascular occlusive disease. He undergone prior aortobifemoral bypass for lower extremity arterial occlusive disease in 1998. He underwent a left carotid endarterectomy in 1994 with a redo for recurrent stenosis in 2001.  Pt reports hurting in both calves if he walks over 45 minutes on a treadmill, resolves with rest, denies non healing wounds. Pt reports stroke at age 44, then several TIA's, the last TIA was the night before the 2001 redo of the left CEA.  He had 3 vessel CABG on 05/23/15 by Dr. Servando Snare.  Pt states he did not have an MI, but was tired all the time; stress test showed the degree of CAD. He continues cardiac rehab.   Pt Diabetic: Yes, states A1C is about 7.? Pt smoker: former smoker, quit in 2010  Pt meds include: Statin :Yes Betablocker: Yes ASA: Yes Other anticoagulants/antiplatelets: Plavix     Current Outpatient Prescriptions  Medication Sig Dispense Refill  . Ascorbic Acid (VITAMIN C) 100 MG tablet Take 100 mg by mouth daily.    Marland Kitchen aspirin EC 81 MG EC tablet Take 1 tablet (81 mg total) by mouth daily.    Marland Kitchen atorvastatin (LIPITOR) 40 MG tablet Take 40 mg by mouth daily.      . Calcium Carbonate-Vitamin D (CALCIUM-D) 600-400 MG-UNIT TABS Take 1 tablet by mouth daily.     . Cholecalciferol (D-3-5) 5000 UNITS capsule Take 5,000 Units by mouth daily.    . clopidogrel (PLAVIX) 75 MG tablet Take 1 tablet (75 mg total) by mouth daily. 1 tablet 0  . dexlansoprazole (DEXILANT) 60 MG capsule Take 60 mg by mouth daily.    Marland Kitchen diltiazem (CARDIZEM CD) 300 MG 24 hr capsule Take 1 capsule (300 mg total) by mouth daily. 30 capsule 5  . glimepiride (AMARYL) 2 MG tablet Take 2 mg by mouth daily before breakfast.      . Hydrocortisone Acetate (PROCTOSOL RE) Place  rectally.    . IRON, FERROUS GLUCONATE, PO Take 40 mg by mouth 2 (two) times daily.    . Liraglutide (VICTOZA [place]) Inject 1.8 mg into the skin daily.     . metFORMIN (GLUCOPHAGE-XR) 500 MG 24 hr tablet Take 2 tablets (1,000 mg total) by mouth 2 (two) times daily.    . metoprolol (LOPRESSOR) 50 MG tablet Take 1.5 tablets (75 mg total) by mouth 2 (two) times daily. 90 tablet 11  . Multiple Vitamin (MULTIVITAMIN) capsule Take 1 capsule by mouth daily.      . traMADol (ULTRAM) 50 MG tablet Take 1 tablet (50 mg total) by mouth every 6 (six) hours as needed for moderate pain. (Patient not taking: Reported on 09/05/2015) 30 tablet 0   No current facility-administered medications for this visit.    Past Medical History  Diagnosis Date  . Hyperlipidemia   . Hypertension   . Diabetes mellitus age 34  . Peripheral vascular disease ([place])   . Arthritis   . Reflux esophagitis   . Stroke [facility]) 1987    Right brain stroke  . Neuropathy ([place]) 2013  . Osteoporosis 2013  . Sleep apnea     uses CPAP  . GERD (gastroesophageal reflux disease)   . H/O hiatal hernia   . Irritable bowel syndrome (IBS)   . Myocardial infarction ([place])  silent inferior MI; patient denies MI history (03/17/13)   . Anemia   . Carotid artery occlusion     a. Carotid US 10/16: RICA 60-79%, L CEA patent with 1-39% stenosis  . Coronary artery disease     a. Myoview 9/16:  EF 52%, inferior fixed defect consistent with diaphragmatic attenuation, intermediate risk secondary to poor exercise tolerance and symptoms during stress;  b. LHC 05/17/2015 90% mid LAD, 80% ost D2, 75% mid RCA, 35% prox RCA. >> S/p CABG  . Diverticulitis   . Wears glasses   . PAF (paroxysmal atrial fibrillation) ([place])     post CABG; Amiodarone stopped 2/2 wheezing;   . History of echocardiogram     a. Echo 9/16: GLS -15.2%, EF 0000000, grade 1 diastolic dysfunction, normal wall motion, aortic sclerosis, dilated aortic root 39 mm, atrial septal lipomatous  hypertrophy    Social History Social History  Substance Use Topics  . Smoking status: Former Smoker -- 2.00 packs/day for 40 years    Types: Cigarettes    Quit date: 03/26/2009  . Smokeless tobacco: Never Used  . Alcohol Use: No    Family History Family History  Problem Relation Age of Onset  . Cancer Mother 44    pancreatic  . Hyperlipidemia Mother   . Stroke Father 65  . Deep vein thrombosis Father   . Hyperlipidemia Father   . Hypertension Father     Surgical History Past Surgical History  Procedure Laterality Date  . Carotid endarterectomy  1994 & redo 2001    Left  . Pr vein bypass graft,aorto-fem-pop  1998  . Iliac artery stent    . Fracture surgery  2013    Right   foo  t X's 2  . Tonsillectomy    . Posterior lumbar fusion  Aug. 14, 2014    Level 1  . Spine surgery  2014  . Cardiac catheterization N/A 05/17/2015    Procedure: Left Heart Cath and Coronary Angiography;  Surgeon: Belva Crome, MD;  Location: [facility];  Service: Cardiovascular;  Laterality: N/A;  . Colonoscopy w/ biopsies and polypectomy    . Coronary artery bypass graft N/A 05/23/2015    Procedure: CORONARY ARTERY BYPASS GRAFTING (CABG) x 3 (LIMA to LAD, SVG to DIAGONAL 2, SVG to PDA) with Endoscopic Vein Harvesting from right greater saphenous vein;  Surgeon: Grace Isaac, MD;  Location: [place];  Service: Open Heart Surgery;  Laterality: N/A;  . Tee without cardioversion N/A 05/23/2015    Procedure: TRANSESOPHAGEAL ECHOCARDIOGRAM (TEE);  Surgeon: Grace Isaac, MD;  Location: [place] [facility];  Service: Open Heart Surgery;  Laterality: N/A;    Allergies  Allergen Reactions  . Hydrocodone Nausea Only  . Hydromorphone Other (See Comments)  . Oxycodone Nausea Only  . Penicillins Rash    Has patient had a PCN reaction causing immediate rash, facial/tongue/throat swelling, SOB or lightheadedness with hypotension: NO Has patient had a PCN reaction causing severe rash involving mucus  membranes or skin necrosis:NO Has patient had a PCN reaction that required hospitalization NO Has patient had a PCN reaction occurring within the last 10 years: NO If all of the above answers are "NO", then may proceed with Cephalosporin use.   [facility] Cross Reactors Rash    Current Outpatient Prescriptions  Medication Sig Dispense Refill  . Ascorbic Acid (VITAMIN C) 100 MG tablet Take 100 mg by mouth daily.    Marland Kitchen aspirin EC 81 MG EC tablet Take  1 tablet (81 mg total) by mouth daily.    Marland Kitchen atorvastatin (LIPITOR) 40 MG tablet Take 40 mg by mouth daily.      . Calcium Carbonate-Vitamin D (CALCIUM-D) 600-400 MG-UNIT TABS Take 1 tablet by mouth daily.     . Cholecalciferol (D-3-5) 5000 UNITS capsule Take 5,000 Units by mouth daily.    . clopidogrel (PLAVIX) 75 MG tablet Take 1 tablet (75 mg total) by mouth daily. 1 tablet 0  . dexlansoprazole (DEXILANT) 60 MG capsule Take 60 mg by mouth daily.    Marland Kitchen diltiazem (CARDIZEM CD) 300 MG 24 hr capsule Take 1 capsule (300 mg total) by mouth daily. 30 capsule 5  . glimepiride (AMARYL) 2 MG tablet Take 2 mg by mouth daily before breakfast.      . Hydrocortisone Acetate (PROCTOSOL RE) Place rectally.    . IRON, FERROUS GLUCONATE, PO Take 40 mg by mouth 2 (two) times daily.    . Liraglutide (VICTOZA [place]) Inject 1.8 mg into the skin daily.     . metFORMIN (GLUCOPHAGE-XR) 500 MG 24 hr tablet Take 2 tablets (1,000 mg total) by mouth 2 (two) times daily.    . metoprolol (LOPRESSOR) 50 MG tablet Take 1.5 tablets (75 mg total) by mouth 2 (two) times daily. 90 tablet 11  . Multiple Vitamin (MULTIVITAMIN) capsule Take 1 capsule by mouth daily.      . traMADol (ULTRAM) 50 MG tablet Take 1 tablet (50 mg total) by mouth every 6 (six) hours as needed for moderate pain. (Patient not taking: Reported on 09/05/2015) 30 tablet 0   No current facility-administered medications for this visit.     REVIEW OF SYSTEMS: See HPI for pertinent positives and  negatives.  Physical Examination Filed Vitals:   09/05/15 1307 09/05/15 1310 09/05/15 1316  BP: 145/81 109/76 131/78  Pulse: 73 72 70  Temp:  96.8 F (36 C)   TempSrc:  Oral   Resp:  16   Height:  6' (1.829 m)   Weight:  199 lb (90.266 kg)   SpO2:  97%    Body mass index is 26.98 kg/(m^2).  General: WDWN in NAD Gait: Normal HENT: WNL Eyes: Pupils equal Pulmonary: normal non-labored breathing, no rales, rhonchi,or wheezing Cardiac: RRR, no murmurs detected  Abdomen: soft, NT, small reducible ventral hernia Skin: no rashes, no ulcers, no cellulitis.  VASCULAR EXAM  Carotid Bruits Left Right   Negative Negative  Radial pulses are 2+ palpable and =. Aorta is not palpable.   VASCULAR EXAM: Extremities without ischemic changes  without Gangrene; without open wounds; feet appear well perfused.     LE Pulses LEFT RIGHT   FEMORAL  palpable  not palpable    POPLITEAL not palpable  not palpable   POSTERIOR TIBIAL not palpable  not palpable    DORSALIS PEDIS  ANTERIOR TIBIAL not palpable  not palpable     Musculoskeletal: no muscle wasting or atrophy; no peripheral edema Neurologic: A&O X 3; Appropriate Affect ;  SENSATION: normal; MOTOR FUNCTION: 5/5 Symmetric, CN 2-12 intact except for some hearing loss, Speech is fluent/normal               Non-Invasive Vascular Imaging (09/05/2015):  CEREBROVASCULAR DUPLEX EVALUATION    INDICATION: Carotid artery disease     PREVIOUS INTERVENTION(S): Left carotid endarterectomy 1994 with revision 2001.    DUPLEX EXAM:     RIGHT  LEFT  Peak Systolic Velocities (cm/s) End Diastolic Velocities (cm/s) Plaque LOCATION Peak Systolic Velocities (cm/s)  End Diastolic  Velocities (cm/s) Plaque  87 17  CCA PROXIMAL 86 21   76 17  CCA MID 84 22   70 20  CCA DISTAL 77 14 HT  275 52  ECA 73 24   33 10  ICA PROXIMAL 62 12   133 40  ICA MID 52 18   45 14  ICA DISTAL 61 20     1.75 ICA / CCA Ratio (PSV) 0.73  Antegrade  Vertebral Flow Antegrade    Brachial Systolic Pressure (mmHg)   Multiphasic (Subclavian artery) Brachial Artery Waveforms Multiphasic (Subclavian artery)    Plaque Morphology:  HM = Homogeneous, HT = Heterogeneous, CP = Calcific Plaque, SP = Smooth Plaque, IP = Irregular Plaque     ADDITIONAL FINDINGS:     IMPRESSION: Right internal carotid artery velocities suggest a 40-59% stenosis.  Patent left carotid endarterectomy site with no evidence of hyperplasia or restenosis.     Compared to the previous exam:  No significant change in comparison to the last exam.     ABI (Date: 09/05/2015)  R: 0.75 (0.74, 07/11/14), DP: mono, PT: mono, TBI: 0.60  L: 0.70 (0.73), DP: mono, PT: bi, TBI: 0.54   ASSESSMENT:  Bradley [name] is a 70 y.o. male who is s/p aortobifemoral bypass for lower extremity arterial occlusive disease in 1998. He underwent a left carotid endarterectomy in 1994 with a redo for recurrent stenosis in 2001. He had a stroke at age 50, then subsequent TIA's until his last TIA in 2001. He has no claudication with walking, no non healing wounds.  ABI's indicate moderate bilateral arterial occlusive disease, stable from a year ago.   His atherosclerotic risk factors include DM that is almost in control and former smoker.  Today's carotid Duplex suggests 40-59% right ICA stenosis and left carotid endarterectomy site with no evidence of hyperplasia or restenosis.  No significant change in comparison to the last exam.   PLAN:   Continue graduated walking program. Based on today's exam and non-invasive vascular lab results, the patient will follow up in 1 year with the following tests: ABI's and carotid duplex. I discussed  in depth with the patient the nature of atherosclerosis, and emphasized the importance of maximal medical management including strict control of blood pressure, blood glucose, and lipid levels, obtaining regular exercise, and cessation of smoking.  The patient is aware that without maximal medical management the underlying atherosclerotic disease process will progress, limiting the benefit of any interventions.  The patient was given information about stroke prevention and what symptoms should prompt the patient to seek immediate medical care.  The patient was given information about PAD including signs, symptoms, treatment, what symptoms should prompt the patient to seek immediate medical care, and risk reduction measures to take. Thank you for allowing [place] to participate in this patient's care.  Clemon Chambers, RN, MSN, FNP-C [facility] Office: [phone]  Clinic MD: Scot Dock  09/05/2015 1:40 PM

## 2015-09-05 NOTE — Patient Instructions (Signed)
Stroke Prevention Some medical conditions and behaviors are associated with an increased chance of having a stroke. You may prevent a stroke by making healthy choices and managing medical conditions. HOW CAN I REDUCE MY RISK OF HAVING A STROKE?   Stay physically active. Get at least 30 minutes of activity on most or all days.  Do not smoke. It may also be helpful to avoid exposure to secondhand smoke.  Limit alcohol use. Moderate alcohol use is considered to be:  No more than 2 drinks per day for men.  No more than 1 drink per day for nonpregnant women.  Eat healthy foods. This involves:  Eating 5 or more servings of fruits and vegetables a day.  Making dietary changes that address high blood pressure (hypertension), high cholesterol, diabetes, or obesity.  Manage your cholesterol levels.  Making food choices that are high in fiber and low in saturated fat, trans fat, and cholesterol may control cholesterol levels.  Take any prescribed medicines to control cholesterol as directed by your health care provider.  Manage your diabetes.  Controlling your carbohydrate and sugar intake is recommended to manage diabetes.  Take any prescribed medicines to control diabetes as directed by your health care provider.  Control your hypertension.  Making food choices that are low in salt (sodium), saturated fat, trans fat, and cholesterol is recommended to manage hypertension.  Ask your health care provider if you need treatment to lower your blood pressure. Take any prescribed medicines to control hypertension as directed by your health care provider.  If you are 18-39 years of age, have your blood pressure checked every 3-5 years. If you are 40 years of age or older, have your blood pressure checked every year.  Maintain a healthy weight.  Reducing calorie intake and making food choices that are low in sodium, saturated fat, trans fat, and cholesterol are recommended to manage  weight.  Stop drug abuse.  Avoid taking birth control pills.  Talk to your health care provider about the risks of taking birth control pills if you are over 35 years old, smoke, get migraines, or have ever had a blood clot.  Get evaluated for sleep disorders (sleep apnea).  Talk to your health care provider about getting a sleep evaluation if you snore a lot or have excessive sleepiness.  Take medicines only as directed by your health care provider.  For some people, aspirin or blood thinners (anticoagulants) are helpful in reducing the risk of forming abnormal blood clots that can lead to stroke. If you have the irregular heart rhythm of atrial fibrillation, you should be on a blood thinner unless there is a good reason you cannot take them.  Understand all your medicine instructions.  Make sure that other conditions (such as anemia or atherosclerosis) are addressed. SEEK IMMEDIATE MEDICAL CARE IF:   You have sudden weakness or numbness of the face, arm, or leg, especially on one side of the body.  Your face or eyelid droops to one side.  You have sudden confusion.  You have trouble speaking (aphasia) or understanding.  You have sudden trouble seeing in one or both eyes.  You have sudden trouble walking.  You have dizziness.  You have a loss of balance or coordination.  You have a sudden, severe headache with no known cause.  You have new chest pain or an irregular heartbeat. Any of these symptoms may represent a serious problem that is an emergency. Do not wait to see if the symptoms will   go away. Get medical help at once. Call your local emergency services (911 in [place] Do not drive yourself to the hospital.   This information is not intended to replace advice given to you by your health care provider. Make sure you discuss any questions you have with your health care provider.   Document Released: 09/04/2004 Document Revised: 08/18/2014 Document Reviewed:  01/28/2013 Elsevier Interactive Patient Education 2016 Elsevier Inc.    Peripheral Vascular Disease Peripheral vascular disease (PVD) is a disease of the blood vessels that are not part of your heart and brain. A simple term for PVD is poor circulation. In most cases, PVD narrows the blood vessels that carry blood from your heart to the rest of your body. This can result in a decreased supply of blood to your arms, legs, and internal organs, like your stomach or kidneys. However, it most often affects a person's lower legs and feet. There are two types of PVD.  Organic PVD. This is the more common type. It is caused by damage to the structure of blood vessels.  Functional PVD. This is caused by conditions that make blood vessels contract and tighten (spasm). Without treatment, PVD tends to get worse over time. PVD can also lead to acute ischemic limb. This is when an arm or limb suddenly has trouble getting enough blood. This is a medical emergency. CAUSES Each type of PVD has many different causes. The most common cause of PVD is buildup of a fatty material (plaque) inside of your arteries (atherosclerosis). Small amounts of plaque can break off from the walls of the blood vessels and become lodged in a smaller artery. This blocks blood flow and can cause acute ischemic limb. Other common causes of PVD include:  Blood clots that form inside of blood vessels.  Injuries to blood vessels.  Diseases that cause inflammation of blood vessels or cause blood vessel spasms.  Health behaviors and health history that increase your risk of developing PVD. RISK FACTORS  You may have a greater risk of PVD if you:  Have a family history of PVD.  Have certain medical conditions, including:  High cholesterol.  Diabetes.  High blood pressure (hypertension).  Coronary heart disease.  Past problems with blood clots.  Past injury, such as burns or a broken bone. These may have damaged blood  vessels in your limbs.  Buerger disease. This is caused by inflamed blood vessels in your hands and feet.  Some forms of arthritis.  Rare birth defects that affect the arteries in your legs.  Use tobacco.  Do not get enough exercise.  Are obese.  Are age 50 or older. SIGNS AND SYMPTOMS  PVD may cause many different symptoms. Your symptoms depend on what part of your body is not getting enough blood. Some common signs and symptoms include:  Cramps in your lower legs. This may be a symptom of poor leg circulation (claudication).  Pain and weakness in your legs while you are physically active that goes away when you rest (intermittent claudication).  Leg pain when at rest.  Leg numbness, tingling, or weakness.  Coldness in a leg or foot, especially when compared with the other leg.  Skin or hair changes. These can include:  Hair loss.  Shiny skin.  Pale or bluish skin.  Thick toenails.  Inability to get or maintain an erection (erectile dysfunction). People with PVD are more prone to developing ulcers and sores on their toes, feet, or legs. These may take longer than   normal to heal. DIAGNOSIS Your health care provider may diagnose PVD from your signs and symptoms. The health care provider will also do a physical exam. You may have tests to find out what is causing your PVD and determine its severity. Tests may include:  Blood pressure recordings from your arms and legs and measurements of the strength of your pulses (pulse volume recordings).  Imaging studies using sound waves to take pictures of the blood flow through your blood vessels (Doppler ultrasound).  Injecting a dye into your blood vessels before having imaging studies using:  X-rays (angiogram or arteriogram).  Computer-generated X-rays (CT angiogram).  A powerful electromagnetic field and a computer (magnetic resonance angiogram or MRA). TREATMENT Treatment for PVD depends on the cause of your condition  and the severity of your symptoms. It also depends on your age. Underlying causes need to be treated and controlled. These include long-lasting (chronic) conditions, such as diabetes, high cholesterol, and high blood pressure. You may need to first try making lifestyle changes and taking medicines. Surgery may be needed if these do not work. Lifestyle changes may include:  Quitting smoking.  Exercising regularly.  Following a low-fat, low-cholesterol diet. Medicines may include:  Blood thinners to prevent blood clots.  Medicines to improve blood flow.  Medicines to improve your blood cholesterol levels. Surgical procedures may include:  A procedure that uses an inflated balloon to open a blocked artery and improve blood flow (angioplasty).  A procedure to put in a tube (stent) to keep a blocked artery open (stent implant).  Surgery to reroute blood flow around a blocked artery (peripheral bypass surgery).  Surgery to remove dead tissue from an infected wound on the affected limb.  Amputation. This is surgical removal of the affected limb. This may be necessary in cases of acute ischemic limb that are not improved through medical or surgical treatments. HOME CARE INSTRUCTIONS  Take medicines only as directed by your health care provider.  Do not use any tobacco products, including cigarettes, chewing tobacco, or electronic cigarettes. If you need help quitting, ask your health care provider.  Lose weight if you are overweight, and maintain a healthy weight as directed by your health care provider.  Eat a diet that is low in fat and cholesterol. If you need help, ask your health care provider.  Exercise regularly. Ask your health care provider to suggest some good activities for you.  Use compression stockings or other mechanical devices as directed by your health care provider.  Take good care of your feet.  Wear comfortable shoes that fit well.  Check your feet often for  any cuts or sores. SEEK MEDICAL CARE IF:  You have cramps in your legs while walking.  You have leg pain when you are at rest.  You have coldness in a leg or foot.  Your skin changes.  You have erectile dysfunction.  You have cuts or sores on your feet that are not healing. SEEK IMMEDIATE MEDICAL CARE IF:  Your arm or leg turns cold and blue.  Your arms or legs become red, warm, swollen, painful, or numb.  You have chest pain or trouble breathing.  You suddenly have weakness in your face, arm, or leg.  You become very confused or lose the ability to speak.  You suddenly have a very bad headache or lose your vision.   This information is not intended to replace advice given to you by your health care provider. Make sure you discuss any questions   you have with your health care provider.   Document Released: 09/04/2004 Document Revised: 08/18/2014 Document Reviewed: 01/05/2014 Elsevier Interactive Patient Education 2016 Elsevier Inc.  

## 2015-09-05 NOTE — Progress Notes (Signed)
Daily Session Note  Patient Details  Name: Bradley [name] MRN: [id number] Date of Birth: 01-Mar-1946 Referring Provider:  Belva Crome, MD  Encounter Date: 09/05/2015  Check In:     Session Check In - 09/05/15 [name]    Check-In   Staff Present Heath Lark, RN, BSN, CCRP;Carroll Enterkin, RN, BSN;Diane Joya Gaskins, RN, BSN   ER physicians immediately available to respond to emergencies See telemetry face sheet for immediately available ER MD   Medication changes reported     No   Fall or balance concerns reported    No   Warm-up and Cool-down Performed on first and last piece of equipment   VAD Patient? No   Pain Assessment   Currently in Pain? No/denies   Pain Score 0-No pain           Exercise Prescription Changes - 09/05/15 1700    Exercise Review   Progression No  Absent since 08/09/15   Response to Exercise   Frequency Add 2 additional days to program exercise sessions.  Bradley [name] plans to walk at home or go to gym to walk on TM.     Duration Progress to 30 minutes of continuous aerobic without signs/symptoms of physical distress   Intensity Rest + 30   Progression Continue progressive overload as per policy without signs/symptoms or physical distress.   Resistance Training   Training Prescription Yes   Weight 3   Reps 10-15   Interval Training   Interval Training Yes   Equipment --  BioStep   Comments Level 4 for 3 minutes, Level 6 for 1 minute. Repeat for 10-15 minutes   Treadmill   MPH 2.8   Grade 0   Minutes 20   Recumbant Bike   Level 3   RPM 50   Minutes 20   NuStep   Level 3   Watts 50   Minutes 20   Biostep-RELP   Level 6   Watts 40   Minutes 20      Goals Met:  [place] with exercise equipment Exercise tolerated well Personal goals reviewed No report of cardiac concerns or symptoms Strength training completed today  Goals Unmet:  Not Applicable  Goals Comments: Patient completed exercise prescription and all exercise goals during  rehab session. The exercise was tolerated well and the patient is progressing in the program. Added 2 days per week to [facility] exercise plan.  Bradley [name] plans to walk at home or go to the gym to walk on treadmill.     Dr. Emily Filbert is Medical Director for [place] and [facility].

## 2015-09-06 ENCOUNTER — Encounter: Payer: Self-pay | Admitting: Cardiothoracic Surgery

## 2015-09-06 ENCOUNTER — Ambulatory Visit (INDEPENDENT_AMBULATORY_CARE_PROVIDER_SITE_OTHER): Payer: PPO | Admitting: Cardiothoracic Surgery

## 2015-09-06 ENCOUNTER — Telehealth: Payer: Self-pay | Admitting: Interventional Cardiology

## 2015-09-06 VITALS — BP 135/80 | HR 69 | Resp 20 | Ht 72.0 in | Wt 199.0 lb

## 2015-09-06 DIAGNOSIS — Z951 Presence of aortocoronary bypass graft: Secondary | ICD-10-CM

## 2015-09-06 NOTE — Telephone Encounter (Signed)
Follow up     Returning a call to the nurse to get an appt with Dr Tamala Julian

## 2015-09-06 NOTE — Progress Notes (Signed)
[address]       [place] [address]             [id number]      Matty T Wegener [place] Medical Record [id number] Date of Birth: Mar 11, 1946  Referring: Belva Crome, MD Primary Care:  Melinda Crutch, MD  Chief Complaint:   POST OP FOLLOW UP 05/23/2015 PREOPERATIVE DIAGNOSIS: Coronary occlusive disease with positive stress test. POSTOPERATIVE DIAGNOSIS: Coronary occlusive disease with positive stress test. SURGICAL PROCEDURE: Coronary artery bypass grafting x3 with the left internal mammary to the left anterior descending coronary artery, reverse saphenous vein graft to the diagonal coronary artery, reverse saphenous vein graft to the posterior descending coronary artery with right thigh greater saphenous vein harvested endoscopically. SURGEON: Lanelle Bal, MD.  History of Present Illness:     Patient doing well postoperatively, currently enrolled in cardiac rehabilitation. Several days ago he noted having pictures using his arms over his head and had some chest discomfort over the right chest and this is spread to the left it is not related to exercise or exertion. Patient also had question of recurrent atrial fib while in rehabilitation Dr. Tamala Julian as well as reviewed these strips. Patient's unaware of any palpitations He has no symptoms of congestive heart failure   Past Medical History  Diagnosis Date  . Hyperlipidemia   . Hypertension   . Diabetes mellitus age 41  . Peripheral vascular disease ([place])   . Arthritis   . Reflux esophagitis   . Stroke [facility]) 1987    Right brain stroke  . Neuropathy ([place]) 2013  . Osteoporosis 2013  . Sleep apnea     uses CPAP  . GERD (gastroesophageal reflux disease)   . H/O hiatal hernia   . Irritable bowel syndrome (IBS)   . Myocardial infarction [facility])     silent inferior MI; patient denies MI history (03/17/13)   . Anemia   . Carotid artery occlusion     a. Carotid US 10/16: RICA 60-79%, L CEA  patent with 1-39% stenosis  . Coronary artery disease     a. Myoview 9/16:  EF 52%, inferior fixed defect consistent with diaphragmatic attenuation, intermediate risk secondary to poor exercise tolerance and symptoms during stress;  b. LHC 05/17/2015 90% mid LAD, 80% ost D2, 75% mid RCA, 35% prox RCA. >> S/p CABG  . Diverticulitis   . Wears glasses   . PAF (paroxysmal atrial fibrillation) ([place])     post CABG; Amiodarone stopped 2/2 wheezing;   . History of echocardiogram     a. Echo 9/16: GLS -15.2%, EF 0000000, grade 1 diastolic dysfunction, normal wall motion, aortic sclerosis, dilated aortic root 39 mm, atrial septal lipomatous hypertrophy     History  Smoking status  . Former Smoker -- 2.00 packs/day for 40 years  . Types: Cigarettes  . Quit date: 03/26/2009  Smokeless tobacco  . Never Used    History  Alcohol Use No     Allergies  Allergen Reactions  . Hydrocodone Nausea Only  . Hydromorphone Other (See Comments)  . Oxycodone Nausea Only  . Penicillins Rash    Has patient had a PCN reaction causing immediate rash, facial/tongue/throat swelling, SOB or lightheadedness with hypotension: NO Has patient had a PCN reaction causing severe rash involving mucus membranes or skin necrosis:NO Has patient had a PCN reaction that required hospitalization NO Has patient had a PCN reaction occurring within the last 10 years: NO If all of  the above answers are "NO", then may proceed with Cephalosporin use.   [facility] Cross Reactors Rash    Current Outpatient Prescriptions  Medication Sig Dispense Refill  . Ascorbic Acid (VITAMIN C) 100 MG tablet Take 100 mg by mouth daily.    Marland Kitchen aspirin EC 81 MG EC tablet Take 1 tablet (81 mg total) by mouth daily.    Marland Kitchen atorvastatin (LIPITOR) 40 MG tablet Take 40 mg by mouth daily.      . Calcium Carbonate-Vitamin D (CALCIUM-D) 600-400 MG-UNIT TABS Take 1 tablet by mouth daily.     . Cholecalciferol (D-3-5) 5000 UNITS capsule Take 5,000 Units by  mouth daily.    . clopidogrel (PLAVIX) 75 MG tablet Take 1 tablet (75 mg total) by mouth daily. 1 tablet 0  . dexlansoprazole (DEXILANT) 60 MG capsule Take 60 mg by mouth daily.    Marland Kitchen diltiazem (CARDIZEM CD) 300 MG 24 hr capsule Take 1 capsule (300 mg total) by mouth daily. 30 capsule 5  . glimepiride (AMARYL) 2 MG tablet Take 2 mg by mouth daily before breakfast.      . Hydrocortisone Acetate (PROCTOSOL RE) Place rectally.    . IRON, FERROUS GLUCONATE, PO Take 40 mg by mouth 2 (two) times daily.    . Liraglutide (VICTOZA [place]) Inject 1.8 mg into the skin daily.     . metFORMIN (GLUCOPHAGE-XR) 500 MG 24 hr tablet Take 2 tablets (1,000 mg total) by mouth 2 (two) times daily.    . metoprolol (LOPRESSOR) 50 MG tablet Take 1.5 tablets (75 mg total) by mouth 2 (two) times daily. 90 tablet 11  . Multiple Vitamin (MULTIVITAMIN) capsule Take 1 capsule by mouth daily.      . traMADol (ULTRAM) 50 MG tablet Take 1 tablet (50 mg total) by mouth every 6 (six) hours as needed for moderate pain. 30 tablet 0   No current facility-administered medications for this visit.       Physical Exam: BP 135/80 mmHg  Pulse 69  Resp 20  Ht 6' (1.829 m)  Wt 199 lb (90.266 kg)  BMI 26.98 kg/m2  SpO2 97%  General appearance: alert, cooperative and appears stated age Neurologic: intact Heart: regular rate and rhythm, S1, S2 normal, no murmur, click, rub or gallop Lungs: clear to auscultation bilaterally Abdomen: soft, non-tender; bowel sounds normal; no masses,  no organomegaly Extremities: extremities normal, atraumatic, no cyanosis or edema and Homans sign is negative, no sign of DVT Wound: Sternum is stable and well-healed   Diagnostic Studies & Laboratory data:     Recent Radiology Findings:   No results found.    Recent Lab Findings: Lab Results  Component Value Date   WBC 8.7 06/13/2015   HGB 11.3* 06/13/2015   HCT 34.8* 06/13/2015   PLT 429* 06/13/2015   GLUCOSE 87 06/13/2015   CHOL 82  06/01/2015   TRIG 158* 06/01/2015   HDL 24* 06/01/2015   LDLCALC 26 06/01/2015   ALT 18 05/30/2015   AST 14* 05/30/2015   NA 131* 06/13/2015   K 4.1 06/13/2015   CL 98 06/13/2015   CREATININE 1.41* 06/13/2015   BUN 18 06/13/2015   CO2 23 06/13/2015   TSH 2.630 05/26/2015   INR 1.15 05/29/2015   HGBA1C 8.1* 05/21/2015      Assessment / Plan:     Patient progressing following coronary artery bypass grafting 3/2 months ago relatively well, he does have vague chest discomfort that is very difficult to characterize, he describes  it is related to hanging pictures. Suggest to him that he follow up with Dr. Tamala Julian it may be the only way to sort out his symptomatology since it is vague as with the objective testing. I plan to see him back as needed.    Grace Isaac MD      [address] [place] [address] Office [phone]   Beeper [phone]  09/06/2015 10:14 AM

## 2015-09-06 NOTE — Telephone Encounter (Signed)
Spoke with pt wife. Per Dr.Gerhardt pt needs to be seen sooner that March to evaluate angina. Pt appt moved up to 2/1 @ 12pm. Pt wife voiced appreciation and verbalized understanding.

## 2015-09-06 NOTE — Telephone Encounter (Signed)
spoke with Jana Half @ [place] office. Adv her that I will work on getting pt an earlier appt. I will call pt directly.  lmom for pt to call the office

## 2015-09-06 NOTE — Progress Notes (Signed)
Daily Session Note  Patient Details  Name: Bradley [name] MRN: [id number] Date of Birth: 25-Mar-1946 Referring Provider:  Belva Crome, MD  Encounter Date: 09/06/2015  Check In:     Session Check In - 09/06/15 1603    Check-In   Staff Present Gerlene Burdock, RN, BSN;Diane Joya Gaskins, RN, BSN;Victorious Cosio, BS, ACSM EP-C, Exercise Physiologist   ER physicians immediately available to respond to emergencies See telemetry face sheet for immediately available ER MD   Medication changes reported     No   Fall or balance concerns reported    No   Warm-up and Cool-down Performed on first and last piece of equipment   VAD Patient? No   Pain Assessment   Currently in Pain? No/denies           Exercise Prescription Changes - 09/05/15 1700    Exercise Review   Progression No  Absent since 08/09/15   Response to Exercise   Frequency Add 2 additional days to program exercise sessions.  Bradley [name] plans to walk at home or go to gym to walk on TM.     Duration Progress to 30 minutes of continuous aerobic without signs/symptoms of physical distress   Intensity Rest + 30   Progression Continue progressive overload as per policy without signs/symptoms or physical distress.   Resistance Training   Training Prescription Yes   Weight 3   Reps 10-15   Interval Training   Interval Training Yes   Equipment --  BioStep   Comments Level 4 for 3 minutes, Level 6 for 1 minute. Repeat for 10-15 minutes   Treadmill   MPH 2.8   Grade 0   Minutes 20   Recumbant Bike   Level 3   RPM 50   Minutes 20   NuStep   Level 3   Watts 50   Minutes 20   Biostep-RELP   Level 6   Watts 40   Minutes 20      Goals Met:  Proper associated with RPD/PD & O2 Sat Exercise tolerated well No report of cardiac concerns or symptoms Strength training completed today  Goals Unmet:  Not Applicable  Goals Comments:    Dr. Emily Filbert is Medical Director for [place] and [facility].

## 2015-09-06 NOTE — Telephone Encounter (Signed)
New Message:  Jana Half called in on behalf of Dr. Servando Snare wanting to see if the pt could be seen sooner that his appt on 3/14 due to reoccurring Angina. Please f/u with Jana Half  Thanks

## 2015-09-07 NOTE — Progress Notes (Signed)
Cardiac Individual Treatment Plan  Patient Details  Name: Bradley [name] MRN: [id number] Date of Birth: 08-Aug-1946 Referring Provider:  Belva Crome, MD  Initial Encounter Date:    Visit Diagnosis: S/P CABG x 3  Patient's Home Medications on Admission:  Current outpatient prescriptions:  .  Ascorbic Acid (VITAMIN C) 100 MG tablet, Take 100 mg by mouth daily., Disp: , Rfl:  .  aspirin EC 81 MG EC tablet, Take 1 tablet (81 mg total) by mouth daily., Disp: , Rfl:  .  atorvastatin (LIPITOR) 40 MG tablet, Take 40 mg by mouth daily.  , Disp: , Rfl:  .  Calcium Carbonate-Vitamin D (CALCIUM-D) 600-400 MG-UNIT TABS, Take 1 tablet by mouth daily. , Disp: , Rfl:  .  Cholecalciferol (D-3-5) 5000 UNITS capsule, Take 5,000 Units by mouth daily., Disp: , Rfl:  .  clopidogrel (PLAVIX) 75 MG tablet, Take 1 tablet (75 mg total) by mouth daily., Disp: 1 tablet, Rfl: 0 .  dexlansoprazole (DEXILANT) 60 MG capsule, Take 60 mg by mouth daily., Disp: , Rfl:  .  diltiazem (CARDIZEM CD) 300 MG 24 hr capsule, Take 1 capsule (300 mg total) by mouth daily., Disp: 30 capsule, Rfl: 5 .  glimepiride (AMARYL) 2 MG tablet, Take 2 mg by mouth daily before breakfast.  , Disp: , Rfl:  .  Hydrocortisone Acetate (PROCTOSOL RE), Place rectally., Disp: , Rfl:  .  IRON, FERROUS GLUCONATE, PO, Take 40 mg by mouth 2 (two) times daily., Disp: , Rfl:  .  Liraglutide (VICTOZA [place]), Inject 1.8 mg into the skin daily. , Disp: , Rfl:  .  metFORMIN (GLUCOPHAGE-XR) 500 MG 24 hr tablet, Take 2 tablets (1,000 mg total) by mouth 2 (two) times daily., Disp: , Rfl:  .  metoprolol (LOPRESSOR) 50 MG tablet, Take 1.5 tablets (75 mg total) by mouth 2 (two) times daily., Disp: 90 tablet, Rfl: 11 .  Multiple Vitamin (MULTIVITAMIN) capsule, Take 1 capsule by mouth daily.  , Disp: , Rfl:  .  traMADol (ULTRAM) 50 MG tablet, Take 1 tablet (50 mg total) by mouth every 6 (six) hours as needed for moderate pain., Disp: 30 tablet, Rfl: 0  Past Medical  History: Past Medical History  Diagnosis Date  . Hyperlipidemia   . Hypertension   . Diabetes mellitus age 70  . Peripheral vascular disease ([place])   . Arthritis   . Reflux esophagitis   . Stroke [facility]) 1987    Right brain stroke  . Neuropathy ([place]) 2013  . Osteoporosis 2013  . Sleep apnea     uses CPAP  . GERD (gastroesophageal reflux disease)   . H/O hiatal hernia   . Irritable bowel syndrome (IBS)   . Myocardial infarction [facility])     silent inferior MI; patient denies MI history (03/17/13)   . Anemia   . Carotid artery occlusion     a. Carotid US 10/16: RICA 60-79%, L CEA patent with 1-39% stenosis  . Coronary artery disease     a. Myoview 9/16:  EF 52%, inferior fixed defect consistent with diaphragmatic attenuation, intermediate risk secondary to poor exercise tolerance and symptoms during stress;  b. LHC 05/17/2015 90% mid LAD, 80% ost D2, 75% mid RCA, 35% prox RCA. >> S/p CABG  . Diverticulitis   . Wears glasses   . PAF (paroxysmal atrial fibrillation) ([place])     post CABG; Amiodarone stopped 2/2 wheezing;   . History of echocardiogram     a. Echo 9/16: GLS -15.2%, EF  53-61%, grade 1 diastolic dysfunction, normal wall motion, aortic sclerosis, dilated aortic root 39 mm, atrial septal lipomatous hypertrophy    Tobacco Use: History  Smoking status  . Former Smoker -- 2.00 packs/day for 40 years  . Types: Cigarettes  . Quit date: 03/26/2009  Smokeless tobacco  . Never Used    Labs: Recent Review Flowsheet Data    Labs for ITP Cardiac and Pulmonary Rehab Latest Ref Rng 05/24/2015 05/24/2015 05/24/2015 05/31/2015 06/01/2015   Cholestrol 0 - 200 mg/dL - - - 83 82   LDLCALC 0 - 99 mg/dL - - - 36 26   HDL >40 mg/dL - - - 26(L) 24(L)   Trlycerides <150 mg/dL - - - 104 158(H)   PHART 7.350 - 7.450 - 7.297(L) - - -   PCO2ART 35.0 - 45.0 mmHg - 43.4 - - -   HCO3 20.0 - 24.0 mEq/L - 21.2 - - -   TCO2 0 - 100 mmol/L _0 - -   ACIDBASEDEF 0.0 - 2.0 mmol/L - 5.0(H) - - -    O2SAT - - 91.0 - - -       Exercise Target Goals:    Exercise Program Goal: Individual exercise prescription set with THRR, safety & activity barriers. Participant demonstrates ability to understand and report RPE using BORG scale, to self-measure pulse accurately, and to acknowledge the importance of the exercise prescription.  Exercise Prescription Goal: Starting with aerobic activity 30 plus minutes a day, 3 days per week for initial exercise prescription. Provide home exercise prescription and guidelines that participant acknowledges understanding prior to discharge.  Activity Barriers & Risk Stratification:     Activity Barriers & Risk Stratification - 07/02/15 1348    Activity Barriers & Risk Stratification   Activity Barriers Back Problems   Risk Stratification High      6 Minute Walk:     6 Minute Walk      07/02/15 1444       6 Minute Walk   Phase Initial     Distance 1175 feet     Walk Time 6 minutes     Resting HR 98 bpm     Resting BP 138/62 mmHg     Max Ex. HR 102 bpm     Max Ex. BP 140/78 mmHg     RPE 12     Perceived Dyspnea  0     Symptoms No        Initial Exercise Prescription:     Initial Exercise Prescription - 07/02/15 1500    Date of Initial Exercise Prescription   Date 07/02/15   Treadmill   MPH 2.5   Grade 0   Minutes 15   Bike   Level 1   Minutes 15   Recumbant Bike   Level 2   Watts 30   Minutes 15   NuStep   Level 3   Watts 40   Minutes 15   Arm Ergometer   Level 1   Watts 10   Minutes 15   Arm/Foot Ergometer   Level 1   Watts 12   Minutes 15   Cybex   Level 2   RPM 40   Minutes 15   Recumbant Elliptical   Level 1   Watts 20   Minutes 15   Elliptical   Level 1   Speed 2.5   Minutes 5   REL-XR   Level 3   Watts 30   Minutes 15  T5 Nustep   Level 2   Watts 20   Minutes 15   Biostep-RELP   Level 3   Watts 30   Minutes 15   Prescription Details   Frequency (times per week) 3   Duration  Progress to 30 minutes of continuous aerobic without signs/symptoms of physical distress   Intensity   THRR REST +  30   Ratings of Perceived Exertion 11-15   Perceived Dyspnea 2-4   Progression Continue progressive overload as per policy without signs/symptoms or physical distress.   Resistance Training   Training Prescription Yes   Weight 2   Reps 10-15      Exercise Prescription Changes:     Exercise Prescription Changes      07/09/15 1439 07/18/15 1600 08/02/15 1249 08/27/15 1100 09/05/15 1700   Exercise Review   Progression Yes Yes Yes No  Absent since 08/09/15 No  Absent since 08/09/15   Response to Exercise   Blood Pressure (Admit) 112/72 mmHg  130/68 mmHg 128/62 mmHg    Blood Pressure (Exercise) 158/62 mmHg  148/70 mmHg 138/76 mmHg    Blood Pressure (Exit) 128/70 mmHg  122/60 mmHg 124/60 mmHg    Heart Rate (Admit) 82 bpm  61 bpm 65 bpm    Heart Rate (Exercise) 96 bpm  97 bpm 93 bpm    Heart Rate (Exit) 81 bpm  64 bpm 64 bpm    Rating of Perceived Exertion (Exercise) _0 Symptoms Back pain at times  No new pain other than chronic back pain No new pain other than chronic back pain    Comments First day of exercise! Patient was oriented to the gym and the equipment functions and settings. Procedures and policies of the gym were outlined and explained. The patient's individual exercise prescription and treatment plan were reviewed with them. All starting workloads were established based on the results of the functional testing  done at the initial intake visit. The plan for exercise progression was also introduced and progression will be customized based on the patient's performance and goals.   Ryun is starting to plateau on some of his exercise progressions. Progression will be slower now that he is working close to his full capacity on each machine. He can continuously exercise for the entire class. We will now use interval training to progress him with his exercise  intensity.  Adyan has been absent since 08/09/15. His workloads may need to be re-evaluated based on his current PA level.     Frequency     Add 2 additional days to program exercise sessions.  Coleson plans to walk at home or go to gym to walk on TM.     Duration Progress to 30 minutes of continuous aerobic without signs/symptoms of physical distress Progress to 30 minutes of continuous aerobic without signs/symptoms of physical distress Progress to 30 minutes of continuous aerobic without signs/symptoms of physical distress Progress to 30 minutes of continuous aerobic without signs/symptoms of physical distress Progress to 30 minutes of continuous aerobic without signs/symptoms of physical distress   Intensity Rest + 30 Rest + 30 Rest + 30 Rest + 30 Rest + 30   Progression Continue progressive overload as per policy without signs/symptoms or physical distress. Continue progressive overload as per policy without signs/symptoms or physical distress. Continue progressive overload as per policy without signs/symptoms or physical distress. Continue progressive overload as per policy without signs/symptoms or physical distress. Continue progressive overload as per policy  without signs/symptoms or physical distress.   Resistance Training   Training Prescription _0    Weight _1 Reps 10-15 10-15 10-15 10-15 10-15   Interval Training   Interval Training No No Yes Yes Yes   Equipment   --  BioStep --  BioStep --  BioStep   Comments   Level 4 for 3 minutes, Level 6 for 1 minute. Repeat for 10-15 minutes Level 4 for 3 minutes, Level 6 for 1 minute. Repeat for 10-15 minutes Level 4 for 3 minutes, Level 6 for 1 minute. Repeat for 10-15 minutes   Treadmill   MPH   2.8 2.8 2.8   Grade   0 0 0   Minutes   _2 Recumbant Bike   Level _3 RPM 50 50 50 50 50   Minutes _4 NuStep   Level _5 Watts 50 50 50 50 50   Minutes _6 Biostep-RELP   Level  _7 Watts  40 40 40 40   Minutes  _8 Discharge Exercise Prescription (Final Exercise Prescription Changes):     Exercise Prescription Changes - 09/05/15 1700    Exercise Review   Progression No  Absent since 08/09/15   Response to Exercise   Frequency Add 2 additional days to program exercise sessions.  Dozier plans to walk at home or go to gym to walk on TM.     Duration Progress to 30 minutes of continuous aerobic without signs/symptoms of physical distress   Intensity Rest + 30   Progression Continue progressive overload as per policy without signs/symptoms or physical distress.   Resistance Training   Training Prescription Yes   Weight 3   Reps 10-15   Interval Training   Interval Training Yes   Equipment --  BioStep   Comments Level 4 for 3 minutes, Level 6 for 1 minute. Repeat for 10-15 minutes   Treadmill   MPH 2.8   Grade 0   Minutes 20   Recumbant Bike   Level 3   RPM 50   Minutes 20   NuStep   Level 3   Watts 50   Minutes 20   Biostep-RELP   Level 6   Watts 40   Minutes 20      Nutrition:  Target Goals: Understanding of nutrition guidelines, daily intake of sodium <1551m, cholesterol <2051m calories 30% from fat and 7% or less from saturated fats, daily to have 5 or more servings of fruits and vegetables.  Biometrics:     Pre Biometrics - 07/02/15 1447    Pre Biometrics   Waist Circumference 43 inches   Hip Circumference 38 inches   Waist to Hip Ratio 1.13 %       Nutrition Therapy Plan and Nutrition Goals:     Nutrition Therapy & Goals - 07/02/15 1451    Nutrition Therapy   Drug/Food Interactions Statins/Certain Fruits   Personal Nutrition Goals   Personal Goal #1 Cont. to eat heart healthy and eat to control his diabetes.    Comments GeTrevonneports that his wife already has him on a heart healthy and a diabetic eating plan. GeSpyrosaid he did not feel he needed to meet individually with the  Cardiac  Rehab Registered dietician.    Intervention Plan   Intervention Using nutrition plan and personal goals to gain a healthy nutrition lifestyle. Add exercise as prescribed.      Nutrition Discharge: Rate Your Plate Scores:   Nutrition Goals Re-Evaluation:     Nutrition Goals Re-Evaluation      08/29/15 1605           Personal Goal #1 Re-Evaluation   Goal Progress Seen Yes       Comments Dahir said that his wife had shoulder surgery 2 weeks ago and people are bringing apple pies and coconut pies to his house so it is hard to eat correctly at times but he says he usually eats healthy to control his blood sugars in the 100 range.           Psychosocial: Target Goals: Acknowledge presence or absence of depression, maximize coping skills, provide positive support system. Participant is able to verbalize types and ability to use techniques and skills needed for reducing stress and depression.  Initial Review & Psychosocial Screening:     Initial Psych Review & Screening - 07/02/15 Arthur? Yes   Comments Onyx reports that his wife feeds him good food based on a diabetic diet and now a heart diet.    Barriers   Psychosocial barriers to participate in program There are no identifiable barriers or psychosocial needs.   Screening Interventions   Interventions Encouraged to exercise      Quality of Life Scores:     Quality of Life - 07/02/15 1502    Quality of Life Scores   Health/Function Pre 20.4 %   Socioeconomic Pre 26.43 %   Psych/Spiritual Pre 27 %   Family Pre 28.8 %   GLOBAL Pre 24.24 %      PHQ-9:     Recent Review Flowsheet Data    Depression screen [facility] 2/9 07/02/2015 06/21/2015   Decreased Interest 2 0   Down, Depressed, Hopeless 1 0   PHQ - 2 Score 3 0   Altered sleeping 0 -   Tired, decreased energy 2 -   Change in appetite 0 -   Feeling bad or failure about yourself  0 -   Trouble concentrating 0 -    Moving slowly or fidgety/restless 1 -   Suicidal thoughts 0 -   PHQ-9 Score 6 -   Difficult doing work/chores Somewhat difficult -      Psychosocial Evaluation and Intervention:     Psychosocial Evaluation - 08/01/15 1658    Psychosocial Evaluation & Interventions   Comments Met with Mr. Cabello today for psychosocial evaluation reporting triple bypass on 10/12.  He has a strong support system with a spouse of 15 years and (2) adult daughters who live close by.  Mr. Jerilynn Mages has diabetes and sleep apnea - but is sleeping fine and his appetite has returned recently.  He denies a history of depression or anxiety or curent symptoms.  He is typically in a positive mood and reports minimal stress in his life currently.  Mr. Jerilynn Mages has goals to continue exercising without being tired, and to lose some more weight while in this program.  His spouse has reported positive benefits already seen since he started this program stating he has "more stamina now  than he has had in the past few years."  Counselor commended Mr. Jerilynn Mages for his progress and his commitment to consistent exercise.  Psychosocial Re-Evaluation:     Psychosocial Re-Evaluation      08/29/15 1608           Psychosocial Re-Evaluation   Interventions Encouraged to attend Cardiac Rehabilitation for the exercise       Comments Celedonio has been having to take care of his wife who had shoulder surgery. People are brining food in but no always the healthiest.           Vocational Rehabilitation: Provide vocational rehab assistance to qualifying candidates.   Vocational Rehab Evaluation & Intervention:     Vocational Rehab - 07/02/15 1350    Initial Vocational Rehab Evaluation & Intervention   Assessment shows need for Vocational Rehabilitation No      Education: Education Goals: Education classes will be provided on a weekly basis, covering required topics. Participant will state understanding/return demonstration of topics  presented.  Learning Barriers/Preferences:   Education Topics: General Nutrition Guidelines/Fats and Fiber: -Group instruction provided by verbal, written material, models and posters to present the general guidelines for heart healthy nutrition. Gives an explanation and review of dietary fats and fiber.          Cardiac Rehab from 09/05/2015 in [facility] Cardiac and Pulmonary Rehab   Date  07/16/15   Educator  PI   Instruction Review Code  2- meets goals/outcomes      Controlling Sodium/Reading Food Labels: -Group verbal and written material supporting the discussion of sodium use in heart healthy nutrition. Review and explanation with models, verbal and written materials for utilization of the food label.      Cardiac Rehab from 09/05/2015 in [facility] Cardiac and Pulmonary Rehab   Date  07/23/15   Educator  PI   Instruction Review Code  2- meets goals/outcomes      Exercise Physiology & Risk Factors: - Group verbal and written instruction with models to review the exercise physiology of the cardiovascular system and associated critical values. Details cardiovascular disease risk factors and the goals associated with each risk factor.   Aerobic Exercise & Resistance Training: - Gives group verbal and written discussion on the health impact of inactivity. On the components of aerobic and resistive training programs and the benefits of this training and how to safely progress through these programs.      Cardiac Rehab from 09/05/2015 in [facility] Cardiac and Pulmonary Rehab   Date  08/27/15   Educator  RM   Instruction Review Code  2- meets goals/outcomes      Flexibility, Balance, General Exercise Guidelines: - Provides group verbal and written instruction on the benefits of flexibility and balance training programs. Provides general exercise guidelines with specific guidelines to those with heart or lung disease. Demonstration and skill practice provided.      Cardiac Rehab from 09/05/2015 in  [facility] Cardiac and Pulmonary Rehab   Date  08/27/15   Educator  RM   Instruction Review Code  2- meets goals/outcomes      Stress Management: - Provides group verbal and written instruction about the health risks of elevated stress, cause of high stress, and healthy ways to reduce stress.   Depression: - Provides group verbal and written instruction on the correlation between heart/lung disease and depressed mood, treatment options, and the stigmas associated with seeking treatment.      Cardiac Rehab from 09/05/2015 in [facility] Cardiac and Pulmonary Rehab   Date  07/25/15   Educator  Juliann Pulse C   Instruction Review Code  2- meets goals/outcomes  Anatomy & Physiology of the Heart: - Group verbal and written instruction and models provide basic cardiac anatomy and physiology, with the coronary electrical and arterial systems. Review of: AMI, Angina, Valve disease, Heart Failure, Cardiac Arrhythmia, Pacemakers, and the ICD.      Cardiac Rehab from 09/05/2015 in [facility] Cardiac and Pulmonary Rehab   Date  09/03/15   Educator  SB   Instruction Review Code  2- meets goals/outcomes      Cardiac Procedures: - Group verbal and written instruction and models to describe the testing methods done to diagnose heart disease. Reviews the outcomes of the test results. Describes the treatment choices: Medical Management, Angioplasty, or Coronary Bypass Surgery.   Cardiac Medications: - Group verbal and written instruction to review commonly prescribed medications for heart disease. Reviews the medication, class of the drug, and side effects. Includes the steps to properly store meds and maintain the prescription regimen.   Go Sex-Intimacy & Heart Disease, Get SMART - Goal Setting: - Group verbal and written instruction through game format to discuss heart disease and the return to sexual intimacy. Provides group verbal and written material to discuss and apply goal setting through the application of the  S.M.A.R.T. Method.   Other Matters of the Heart: - Provides group verbal, written materials and models to describe Heart Failure, Angina, Valve Disease, and Diabetes in the realm of heart disease. Includes description of the disease process and treatment options available to the cardiac patient.      Cardiac Rehab from 09/05/2015 in [facility] Cardiac and Pulmonary Rehab   Date  07/11/15   Educator  DW   Instruction Review Code  2- meets goals/outcomes      Exercise & Equipment Safety: - Individual verbal instruction and demonstration of equipment use and safety with use of the equipment.      Cardiac Rehab from 09/05/2015 in [facility] Cardiac and Pulmonary Rehab   Date  07/02/15   Educator  C. EnterkinRN   Instruction Review Code  1- partially meets, needs review/practice      Infection Prevention: - Provides verbal and written material to individual with discussion of infection control including proper hand washing and proper equipment cleaning during exercise session.      Cardiac Rehab from 09/05/2015 in [facility] Cardiac and Pulmonary Rehab   Date  07/02/15   Educator  C. [place]   Instruction Review Code  1- partially meets, needs review/practice      Falls Prevention: - Provides verbal and written material to individual with discussion of falls prevention and safety.      Cardiac Rehab from 09/05/2015 in [facility] Cardiac and Pulmonary Rehab   Date  07/02/15   Educator  C. [place]   Instruction Review Code  2- meets goals/outcomes      Diabetes: - Individual verbal and written instruction to review signs/symptoms of diabetes, desired ranges of glucose level fasting, after meals and with exercise. Advice that pre and post exercise glucose checks will be done for 3 sessions at entry of program.      Cardiac Rehab from 09/05/2015 in [facility] Cardiac and Pulmonary Rehab   Date  07/02/15   Educator  C. EnterkinRN   Instruction Review Code  2- meets goals/outcomes       Knowledge  Questionnaire Score:     Knowledge Questionnaire Score - 07/02/15 1451    Knowledge Questionnaire Score   Pre Score 27      Personal Goals and Risk Factors at Admission:  Personal Goals and Risk Factors at Admission - 07/02/15 1447    Personal Goals and Risk Factors on Admission   Increase Aerobic Exercise and Physical Activity Yes   Intervention While in program, learn and follow the exercise prescription taught. Start at a low level workload and increase workload after able to maintain previous level for 30 minutes. Increase time before increasing intensity.  Goal time to achieve goal by 36 sessions.    Diabetes Yes   Goal Blood glucose control identified by blood glucose values, HgbA1C. Participant verbalizes understanding of the signs/symptoms of hyper/hypo glycemia, proper foot care and importance of medication and nutrition plan for blood glucose control.   Intervention Provide nutrition & aerobic exercise along with prescribed medications to achieve blood glucose in normal ranges: Fasting 65-99 mg/dL   Hypertension Yes   Goal Participant will see blood pressure controlled within the values of 140/2m/Hg or within value directed by their physician.   Intervention Provide nutrition & aerobic exercise along with prescribed medications to achieve BP 140/90 or less.   Lipids Yes   Goal Cholesterol controlled with medications as prescribed, with individualized exercise RX and with personalized nutrition plan. Value goals: LDL < 765m HDL > 4025mParticipant states understanding of desired cholesterol values and following prescriptions.   Intervention Provide nutrition & aerobic exercise along with prescribed medications to achieve LDL <8m68mDL >40mg62m   Personal Goals and Risk Factors Review:      Goals and Risk Factor Review      07/09/15 1737 07/25/15 1737 08/27/15 1151 08/29/15 1606     Increase Aerobic Exercise and Physical Activity   Goals Progress/Improvement seen   No  No     Comments Today was first exsecise session.  Spoke with GeorgMauriliot his overall goals and short term goal ssetting.  He does want to return to his golf game. He states that will be awhile before that happens. Also he wants to return to exercising 30 minutes 3 times a day and doing weights. Short term goal is to attend class 3 days a week and follow the exerciseprescription that he is given.   GeorgSimsbeen absent since 08/09/15. His exercise goals and rx. will need to be re-evaluated based on his current PA level.      Diabetes   Progress seen towards goals  Yes  Yes    Comments  GeorgCraigorytes he is changing his eating habits more fruit and vegs, less fried foods, decreased his starches, added more beans/legumes.  GeorgKycen the past 2 weeks have been tough since his wife had shoulder surgery and she usually cooks healthy for him. He asked about our cafeteria and I said that they have green dots for healthy food.     Hypertension   Progress seen toward goals  Yes  Yes    Comments  GeorgYanistes he is changing his eating habits more fruit and vegs, less fried foods, decreased his starches, added more beans/legumes. He is keeping an eye on his sodium intake.  Blood pressure has been stable -see telemetry sheets.     Abnormal Lipids   Progress seen towards goals  Yes  Yes    Comments  GeorgMacdonaldtes he is changing his eating habits more fruit and vegs, less fried foods, decreased his starches, added more beans/legumes. He is progressing with his exercise program  NornaStephane Niemannbeen trying to eat more fruits andvegetables even when they go out  to eat.        Personal Goals Discharge (Final Personal Goals and Risk Factors Review):      Goals and Risk Factor Review - 08/29/15 1606    Diabetes   Progress seen towards goals Yes   Comments Horst said the past 2 weeks have been tough since his wife had shoulder surgery and she usually cooks healthy for him. He asked about our  cafeteria and I said that they have green dots for healthy food.    Hypertension   Progress seen toward goals Yes   Comments Blood pressure has been stable -see telemetry sheets.    Abnormal Lipids   Progress seen towards goals Yes   Comments Mendy Lapinsky has been trying to eat more fruits andvegetables even when they go out to eat.       ITP Comments:     ITP Comments      07/17/15 1318 08/12/15 1408 09/07/15 1227       ITP Comments 30 day review preparation  Continue with ITP   New to program only a few visits completed Ready for 30 day review.  Continue with ITP Ready fro 30 day review, Continue with ITP.         Comments:

## 2015-09-10 DIAGNOSIS — Z951 Presence of aortocoronary bypass graft: Secondary | ICD-10-CM | POA: Diagnosis not present

## 2015-09-10 NOTE — Progress Notes (Signed)
Daily Session Note  Patient Details  Name: DAIMIEN PATMON MRN: [id number] Date of Birth: [birth year] Referring Provider:  Lona Kettle, MD  Encounter Date: 09/10/2015  Check In:     Session Check In - 09/10/15 1638    Check-In   Staff Present Heath Lark, RN, BSN, CCRP;Davan Nawabi, BS, ACSM EP-C, Exercise Physiologist;Other   ER physicians immediately available to respond to emergencies See telemetry face sheet for immediately available ER MD   Medication changes reported     No   Fall or balance concerns reported    No   Warm-up and Cool-down Performed on first and last piece of equipment   VAD Patient? No   Pain Assessment   Currently in Pain? No/denies         Goals Met:  Proper associated with RPD/PD & O2 Sat Exercise tolerated well No report of cardiac concerns or symptoms Strength training completed today  Goals Unmet:  Not Applicable  Goals Comments:    Dr. Emily Filbert is Medical Director for [facility] and [facility].

## 2015-09-12 ENCOUNTER — Encounter: Payer: PPO | Attending: Interventional Cardiology | Admitting: *Deleted

## 2015-09-12 ENCOUNTER — Encounter: Payer: Self-pay | Admitting: Interventional Cardiology

## 2015-09-12 ENCOUNTER — Ambulatory Visit (INDEPENDENT_AMBULATORY_CARE_PROVIDER_SITE_OTHER): Payer: PPO | Admitting: Interventional Cardiology

## 2015-09-12 VITALS — BP 152/94 | HR 86 | Ht 72.0 in | Wt 201.8 lb

## 2015-09-12 DIAGNOSIS — I10 Essential (primary) hypertension: Secondary | ICD-10-CM

## 2015-09-12 DIAGNOSIS — I454 Nonspecific intraventricular block: Secondary | ICD-10-CM

## 2015-09-12 DIAGNOSIS — I480 Paroxysmal atrial fibrillation: Secondary | ICD-10-CM

## 2015-09-12 DIAGNOSIS — Z951 Presence of aortocoronary bypass graft: Secondary | ICD-10-CM | POA: Diagnosis not present

## 2015-09-12 DIAGNOSIS — I4510 Unspecified right bundle-branch block: Secondary | ICD-10-CM

## 2015-09-12 DIAGNOSIS — N183 Chronic kidney disease, stage 3 (moderate): Secondary | ICD-10-CM | POA: Diagnosis not present

## 2015-09-12 DIAGNOSIS — E785 Hyperlipidemia, unspecified: Secondary | ICD-10-CM

## 2015-09-12 DIAGNOSIS — I739 Peripheral vascular disease, unspecified: Secondary | ICD-10-CM

## 2015-09-12 DIAGNOSIS — I2510 Atherosclerotic heart disease of native coronary artery without angina pectoris: Secondary | ICD-10-CM

## 2015-09-12 DIAGNOSIS — I4439 Other atrioventricular block: Secondary | ICD-10-CM

## 2015-09-12 MED ORDER — LOSARTAN POTASSIUM-HCTZ 50-12.5 MG PO TABS: 1.0000 | ORAL_TABLET | Freq: Every day | ORAL | Status: DC

## 2015-09-12 MED ORDER — METOPROLOL TARTRATE 100 MG PO TABS: 100.0000 mg | ORAL_TABLET | Freq: Two times a day (BID) | ORAL | Status: DC

## 2015-09-12 NOTE — Progress Notes (Signed)
Daily Session Note  Patient Details  Name: Bradley [name] MRN: [id number] Date of Birth: 08/22/1945 Referring Provider:  Smith, Henry W, MD  Encounter Date: 09/12/2015  Check In:     Session Check In - 09/12/15 1630    Check-In   Staff Present Renee MacMillan, MS, ACSM CEP, Exercise Physiologist;Carroll Enterkin, RN, BSN;Susanne Bice, RN, BSN, CCRP   ER physicians immediately available to respond to emergencies See telemetry face sheet for immediately available ER MD   Medication changes reported     No   Fall or balance concerns reported    No   Warm-up and Cool-down Performed on first and last piece of equipment   VAD Patient? No   Pain Assessment   Currently in Pain? No/denies   Multiple Pain Sites No           Exercise Prescription Changes - 09/12/15 1600    Response to Exercise   Symptoms None   Comments [name] has maintained much regular attendance and has resumed previous workloads. He is also being conscious of exercising outside of class and reported that he has exercised on the "off" days of class each day this week. He is very proud of that.   Frequency Add 2 additional days to program exercise sessions.  [name] is walking at home on days he doesn't come to rehab   Duration Progress to 30 minutes of continuous aerobic without signs/symptoms of physical distress   Intensity Rest + 30   Progression Continue progressive overload as per policy without signs/symptoms or physical distress.   Resistance Training   Training Prescription Yes   Weight 3   Reps 10-15   Interval Training   Interval Training Yes   Equipment --  BioStep   Comments Level 4 for 3 minutes, Level 6 for 1 minute. Repeat for 10-15 minutes   Treadmill   MPH 2.8   Grade 0   Minutes 20   Recumbant Bike   Level 3   RPM 50   Minutes 20   NuStep   Level 3   Watts 50   Minutes 20   Biostep-RELP   Level 6   Watts 40   Minutes 20      Goals Met:  Independence with exercise  equipment Exercise tolerated well Personal goals reviewed No report of cardiac concerns or symptoms Strength training completed today  Goals Unmet:  Not Applicable  Goals Comments: Patient completed exercise prescription and all exercise goals during rehab session. The exercise was tolerated well and the patient is progressing in the program.    Dr. Mark Miller is Medical Director for [facility] and [facility]. 

## 2015-09-12 NOTE — Addendum Note (Signed)
Addended by: Lynford Humphrey on: 09/12/2015 10:40 AM   Modules accepted: Orders

## 2015-09-12 NOTE — Progress Notes (Signed)
Cardiology Office Note   Date:  09/12/2015   ID:  Bradley [name], DOB January 21, 1946, MRN [id number]  PCP:   Melinda Crutch, MD  Cardiologist:  Sinclair Grooms, MD   Chief Complaint  Patient presents with  . Atrial Fibrillation      History of Present Illness: Bradley [name] is a 70 y.o. male who presents for  Diffuse vascular disease including carotid, peripheral arterial, coronary disease with recent coronary bypass grafting, postoperative atrial fibrillation, hypertension, diabetes, and hyperlipidemia.   The patient recently underwent coronary grafting by Dr. Servando Snare. In recovery he has had paroxysmal atrial fibrillation while hospitalized. He was initially on amiodarone but developed dyspnea. In cardiac rehabilitation has had some episodes of wide complex rhythm which on further inspection relates to a rate related bundle branch block. Dr. Earnest Conroy concerned that he was having recurrent atrial fibrillation. Atrial fibrillation while hospitalized was not symptomatic.  Because of the complaints, a 30 day monitor was performed and did not reveal atrial fibrillation.   No palpitations. Lower extremity swelling.  No episodes of syncope. Occasional brief nausea. Also some movement related and cough related chest discomfort.    Past Medical History  Diagnosis Date  . Hyperlipidemia   . Hypertension   . Diabetes mellitus age 20  . Peripheral vascular disease ([place])   . Arthritis   . Reflux esophagitis   . Stroke [facility]) 1987    Right brain stroke  . Neuropathy ([place]) 2013  . Osteoporosis 2013  . Sleep apnea     uses CPAP  . GERD (gastroesophageal reflux disease)   . H/O hiatal hernia   . Irritable bowel syndrome (IBS)   . Myocardial infarction [facility])     silent inferior MI; patient denies MI history (03/17/13)   . Anemia   . Carotid artery occlusion     a. Carotid US 10/16: RICA 60-79%, L CEA patent with 1-39% stenosis  . Coronary artery disease     a. Myoview 9/16:  EF 52%,  inferior fixed defect consistent with diaphragmatic attenuation, intermediate risk secondary to poor exercise tolerance and symptoms during stress;  b. LHC 05/17/2015 90% mid LAD, 80% ost D2, 75% mid RCA, 35% prox RCA. >> S/p CABG  . Diverticulitis   . Wears glasses   . PAF (paroxysmal atrial fibrillation) ([place])     post CABG; Amiodarone stopped 2/2 wheezing;   . History of echocardiogram     a. Echo 9/16: GLS -15.2%, EF 0000000, grade 1 diastolic dysfunction, normal wall motion, aortic sclerosis, dilated aortic root 39 mm, atrial septal lipomatous hypertrophy    Past Surgical History  Procedure Laterality Date  . Carotid endarterectomy  1994 & redo 2001    Left  . Pr vein bypass graft,aorto-fem-pop  1998  . Iliac artery stent    . Fracture surgery  2013    Right   foo  t X's 2  . Tonsillectomy    . Posterior lumbar fusion  Aug. 14, 2014    Level 1  . Spine surgery  2014  . Cardiac catheterization N/A 05/17/2015    Procedure: Left Heart Cath and Coronary Angiography;  Surgeon: Belva Crome, MD;  Location: [facility];  Service: Cardiovascular;  Laterality: N/A;  . Colonoscopy w/ biopsies and polypectomy    . Coronary artery bypass graft N/A 05/23/2015    Procedure: CORONARY ARTERY BYPASS GRAFTING (CABG) x 3 (LIMA to LAD, SVG to DIAGONAL 2, SVG to PDA) with Endoscopic  Vein Harvesting from right greater saphenous vein;  Surgeon: Grace Isaac, MD;  Location: [facility];  Service: Open Heart Surgery;  Laterality: N/A;  . Tee without cardioversion N/A 05/23/2015    Procedure: TRANSESOPHAGEAL ECHOCARDIOGRAM (TEE);  Surgeon: Grace Isaac, MD;  Location: [facility];  Service: Open Heart Surgery;  Laterality: N/A;     Current Outpatient Prescriptions  Medication Sig Dispense Refill  . Ascorbic Acid (VITAMIN C) 100 MG tablet Take 100 mg by mouth daily.    Marland Kitchen aspirin EC 81 MG EC tablet Take 1 tablet (81 mg total) by mouth daily.    Marland Kitchen atorvastatin (LIPITOR) 40 MG tablet Take 40 mg by mouth  daily.      . Calcium Carbonate-Vitamin D (CALCIUM-D) 600-400 MG-UNIT TABS Take 1 tablet by mouth daily.     . Cholecalciferol (D-3-5) 5000 UNITS capsule Take 5,000 Units by mouth daily.    . clopidogrel (PLAVIX) 75 MG tablet Take 1 tablet (75 mg total) by mouth daily. 1 tablet 0  . dexlansoprazole (DEXILANT) 60 MG capsule Take 60 mg by mouth daily.    Marland Kitchen glimepiride (AMARYL) 2 MG tablet Take 2 mg by mouth daily before breakfast.      . Hydrocortisone Acetate (PROCTOSOL RE) Place rectally.    . IRON, FERROUS GLUCONATE, PO Take 40 mg by mouth 2 (two) times daily.    . Liraglutide (VICTOZA McKenzie) Inject 1.8 mg into the skin daily.     . metFORMIN (GLUCOPHAGE-XR) 500 MG 24 hr tablet Take 2 tablets (1,000 mg total) by mouth 2 (two) times daily.    . metoprolol (LOPRESSOR) 100 MG tablet Take 1 tablet (100 mg total) by mouth 2 (two) times daily. 180 tablet 3  . Multiple Vitamin (MULTIVITAMIN) capsule Take 1 capsule by mouth daily.      . traMADol (ULTRAM) 50 MG tablet Take 1 tablet (50 mg total) by mouth every 6 (six) hours as needed for moderate pain. 30 tablet 0  . losartan-hydrochlorothiazide (HYZAAR) 50-12.5 MG tablet Take 1 tablet by mouth daily. 90 tablet 3   No current facility-administered medications for this visit.    Allergies:   Hydrocodone; Hydromorphone; Oxycodone; Penicillins; and Sulfa drugs cross reactors    Social History:  The patient  reports that he quit smoking about 6 years ago. His smoking use included Cigarettes. He has a 80 pack-year smoking history. He has never used smokeless tobacco. He reports that he does not drink alcohol or use illicit drugs.   Family History:  The patient's family history includes Cancer (age of onset: 23) in his mother; Deep vein thrombosis in his father; Hyperlipidemia in his father and mother; Hypertension in his father; Stroke (age of onset: 48) in his father.    ROS:  Please see the history of present illness.   Otherwise, review of systems are  positive for  Chest pain as described above, cough, occasional nausea , and easy bruising..   All other systems are reviewed and negative.    PHYSICAL EXAM: VS:  BP 152/94 mmHg  Pulse 86  Ht 6' (1.829 m)  Wt 201 lb 12.8 oz (91.536 kg)  BMI 27.36 kg/m2 , BMI Body mass index is 27.36 kg/(m^2). GEN: Well nourished, well developed, in no acute distress HEENT: normal Neck: no JVD, carotid bruits, or masses Cardiac: RRR.  There no no murmur, rub, or gallop. There is  Trace bilateral edema. Respiratory:  clear to auscultation bilaterally, normal work of breathing. GI: soft, nontender, nondistended, +  BS MS: no deformity or atrophy Skin: warm and dry, no rash Neuro:  Strength and sensation are intact Psych: euthymic mood, full affect   EKG:  EKG  Is repeated.   It reveals normal sinus rhythm with normal PR interval and no evidence of infarction.    Recent Labs: 04/30/2015: Pro B Natriuretic peptide (BNP) 6.0 05/24/2015: Magnesium 1.9 05/26/2015: TSH 2.630 05/30/2015: ALT 18 06/13/2015: BUN 18; Creat 1.41*; Hemoglobin 11.3*; Platelets 429*; Potassium 4.1; Sodium 131*    Lipid Panel    Component Value Date/Time   CHOL 82 06/01/2015 0315   TRIG 158* 06/01/2015 0315   HDL 24* 06/01/2015 0315   CHOLHDL 3.4 06/01/2015 0315   VLDL 32 06/01/2015 0315   LDLCALC 26 06/01/2015 0315      Wt Readings from Last 3 Encounters:  09/12/15 201 lb 12.8 oz (91.536 kg)  09/06/15 199 lb (90.266 kg)  09/05/15 199 lb (90.266 kg)      Other studies Reviewed: Additional studies/ records that were reviewed today include:  Electronic health record was reviewed. Cardiac rehabilitation nodes and rhythm strips were reviewed.. The findings include .Rhythm strip from cardiac rehabilitation reveal rate related bundle branch block and not ventricular tachycardia or atrial fibrillation as previously feared.    ASSESSMENT AND PLAN:  1. S/P CABG x 3  stable but with some persisting musculoskeletal  parasternal chest discomfort, possibly costochondral inflammation  2. Essential hypertension  poorly controlled.  3. PAF (paroxysmal atrial fibrillation) ([name])  30 day monitor did not demonstrate any recurrent A. fib  4. CKD (chronic kidney disease), stage 3 (moderate)  chronic kidney disease stage III with creatinine levels in the 1.7 range prior to surgery. ARB therapy was discontinued at discharge.  5. Right bundle branch block  intermittent  6. Hyperlipidemia  on therapy  7. PAD (peripheral artery disease) ([name])  asymptomatic   Current medicines are reviewed at length with the patient today.  The patient has the following concerns regarding medicines:  Changes are made. Dyspnea on amiodarone which was discontinued..  The following changes/actions have been instituted:     increase metoprolol tartrate to 100 mg twice a day. Previously on carvedilol 3.125 mg twice a day   Resume ARB therapy in the form of Hyzaar 50/12.5 mg daily   Basic metabolic panel in 0000000 days with clinical follow-up in 3-4 weeks.   Continue cardiac rehabilitation and blood pressure monitoring   Call if palpitations , dyspnea, or other concerns.  Labs/ tests ordered today include:   Orders Placed This Encounter  Procedures  . Basic metabolic panel  . EKG 12-Lead     Disposition:   FU with HS in 4 weeks  Signed, Sinclair Grooms, MD  09/12/2015 1:09 PM    [address], [place], [place]  [address] Phone: [phone]; Fax: [phone]

## 2015-09-12 NOTE — Patient Instructions (Addendum)
Medication Instructions:  Your physician has recommended you make the following change in your medication:  1) STOP Diltiazem 2) INCREASE Metoprolol to 100mg  twice daily. An Rx has been sent to your pharmacy 3) START Hyzaar 50-12.5mg  daily. An Rx has been sent to your pharmacy  Labwork: Your physician recommends that you return for lab work in: 1 week 09/19/15   Testing/Procedures: Non ordered    Follow-Up: You have a follow up appointment scheduled on 10/18/15 @9 :45am    Any Other Special Instructions Will Be Listed Below (If Applicable).     If you need a refill on your cardiac medications before your next appointment, please call your pharmacy.

## 2015-09-14 DIAGNOSIS — L905 Scar conditions and fibrosis of skin: Secondary | ICD-10-CM | POA: Diagnosis not present

## 2015-09-14 DIAGNOSIS — G4733 Obstructive sleep apnea (adult) (pediatric): Secondary | ICD-10-CM | POA: Diagnosis not present

## 2015-09-14 DIAGNOSIS — D0439 Carcinoma in situ of skin of other parts of face: Secondary | ICD-10-CM | POA: Diagnosis not present

## 2015-09-17 ENCOUNTER — Encounter: Payer: PPO | Admitting: *Deleted

## 2015-09-17 DIAGNOSIS — H401130 Primary open-angle glaucoma, bilateral, stage unspecified: Secondary | ICD-10-CM | POA: Diagnosis not present

## 2015-09-17 DIAGNOSIS — Z951 Presence of aortocoronary bypass graft: Secondary | ICD-10-CM | POA: Diagnosis not present

## 2015-09-17 DIAGNOSIS — E113293 Type 2 diabetes mellitus with mild nonproliferative diabetic retinopathy without macular edema, bilateral: Secondary | ICD-10-CM | POA: Diagnosis not present

## 2015-09-17 DIAGNOSIS — H2513 Age-related nuclear cataract, bilateral: Secondary | ICD-10-CM | POA: Diagnosis not present

## 2015-09-17 NOTE — Progress Notes (Signed)
Daily Session Note  Patient Details  Name: Bradley [name] MRN: [id number] Date of Birth: August 28, 1945 Referring Provider:  Belva Crome, MD  Encounter Date: 09/17/2015  Check In:     Session Check In - 09/17/15 1630    Check-In   Location [facility]   Staff Present Candiss Norse, MS, ACSM CEP, Exercise Physiologist;Susanne Bice, RN, BSN, CCRP;Rebecca Sickles, PT   Supervising physician immediately available to respond to emergencies See telemetry face sheet for immediately available ER MD   Medication changes reported     No   Fall or balance concerns reported    No   Warm-up and Cool-down Performed on first and last piece of equipment   Resistance Training Performed No   VAD Patient? No   Pain Assessment   Currently in Pain? No/denies   Multiple Pain Sites No         Goals Met:  Independence with exercise equipment Exercise tolerated well No report of cardiac concerns or symptoms Strength training completed today  Goals Unmet:  Not Applicable  Goals Comments: Patient completed exercise prescription and all exercise goals during rehab session. The exercise was tolerated well and the patient is progressing in the program.    Dr. Emily Filbert is Medical Director for [facility] and [facility].

## 2015-09-19 ENCOUNTER — Other Ambulatory Visit (INDEPENDENT_AMBULATORY_CARE_PROVIDER_SITE_OTHER): Payer: PPO

## 2015-09-19 ENCOUNTER — Encounter: Payer: Self-pay | Admitting: Interventional Cardiology

## 2015-09-19 DIAGNOSIS — I10 Essential (primary) hypertension: Secondary | ICD-10-CM | POA: Diagnosis not present

## 2015-09-19 LAB — BASIC METABOLIC PANEL
BUN: 20 mg/dL (ref 7–25)
CO2: 22 mmol/L (ref 20–31)
Calcium: 9.1 mg/dL (ref 8.6–10.3)
Chloride: 104 mmol/L (ref 98–110)
Creat: 1.76 mg/dL — ABNORMAL HIGH (ref 0.70–1.18)
Glucose, Bld: 188 mg/dL — ABNORMAL HIGH (ref 65–99)
Potassium: 4.2 mmol/L (ref 3.5–5.3)
Sodium: 137 mmol/L (ref 135–146)

## 2015-09-21 NOTE — Progress Notes (Signed)
Cardiac Individual Treatment Plan  Patient Details  Name: Bradley [name] MRN: [id number] Date of Birth: 1946-07-06 Referring Provider:  Belva Crome, MD  Initial Encounter Date:    Visit Diagnosis: S/P CABG x 3  Patient's Home Medications on Admission:  Current outpatient prescriptions:  .  Ascorbic Acid (VITAMIN C) 100 MG tablet, Take 100 mg by mouth daily., Disp: , Rfl:  .  aspirin EC 81 MG EC tablet, Take 1 tablet (81 mg total) by mouth daily., Disp: , Rfl:  .  atorvastatin (LIPITOR) 40 MG tablet, Take 40 mg by mouth daily.  , Disp: , Rfl:  .  Calcium Carbonate-Vitamin D (CALCIUM-D) 600-400 MG-UNIT TABS, Take 1 tablet by mouth daily. , Disp: , Rfl:  .  Cholecalciferol (D-3-5) 5000 UNITS capsule, Take 5,000 Units by mouth daily., Disp: , Rfl:  .  clopidogrel (PLAVIX) 75 MG tablet, Take 1 tablet (75 mg total) by mouth daily., Disp: 1 tablet, Rfl: 0 .  dexlansoprazole (DEXILANT) 60 MG capsule, Take 60 mg by mouth daily., Disp: , Rfl:  .  glimepiride (AMARYL) 2 MG tablet, Take 2 mg by mouth daily before breakfast.  , Disp: , Rfl:  .  Hydrocortisone Acetate (PROCTOSOL RE), Place rectally., Disp: , Rfl:  .  IRON, FERROUS GLUCONATE, PO, Take 40 mg by mouth 2 (two) times daily., Disp: , Rfl:  .  Liraglutide (VICTOZA [place]), Inject 1.8 mg into the skin daily. , Disp: , Rfl:  .  losartan-hydrochlorothiazide (HYZAAR) 50-12.5 MG tablet, Take 1 tablet by mouth daily., Disp: 90 tablet, Rfl: 3 .  metFORMIN (GLUCOPHAGE-XR) 500 MG 24 hr tablet, Take 2 tablets (1,000 mg total) by mouth 2 (two) times daily., Disp: , Rfl:  .  metoprolol (LOPRESSOR) 100 MG tablet, Take 1 tablet (100 mg total) by mouth 2 (two) times daily., Disp: 180 tablet, Rfl: 3 .  Multiple Vitamin (MULTIVITAMIN) capsule, Take 1 capsule by mouth daily.  , Disp: , Rfl:  .  traMADol (ULTRAM) 50 MG tablet, Take 1 tablet (50 mg total) by mouth every 6 (six) hours as needed for moderate pain., Disp: 30 tablet, Rfl: 0  Past Medical  History: Past Medical History  Diagnosis Date  . Hyperlipidemia   . Hypertension   . Diabetes mellitus age 70  . Peripheral vascular disease ([place])   . Arthritis   . Reflux esophagitis   . Stroke [facility] 1987    Right brain stroke  . Neuropathy ([facility]) 2013  . Osteoporosis 2013  . Sleep apnea     uses CPAP  . GERD (gastroesophageal reflux disease)   . H/O hiatal hernia   . Irritable bowel syndrome (IBS)   . Myocardial infarction [facility])     silent inferior MI; patient denies MI history (03/17/13)   . Anemia   . Carotid artery occlusion     a. Carotid US 10/16: RICA 60-79%, L CEA patent with 1-39% stenosis  . Coronary artery disease     a. Myoview 9/16:  EF 52%, inferior fixed defect consistent with diaphragmatic attenuation, intermediate risk secondary to poor exercise tolerance and symptoms during stress;  b. LHC 05/17/2015 90% mid LAD, 80% ost D2, 75% mid RCA, 35% prox RCA. >> S/p CABG  . Diverticulitis   . Wears glasses   . PAF (paroxysmal atrial fibrillation) ([place])     post CABG; Amiodarone stopped 2/2 wheezing;   . History of echocardiogram     a. Echo 9/16: GLS -15.2%, EF 03-50%, grade 1 diastolic dysfunction, normal  wall motion, aortic sclerosis, dilated aortic root 39 mm, atrial septal lipomatous hypertrophy    Tobacco Use: History  Smoking status  . Former Smoker -- 2.00 packs/day for 40 years  . Types: Cigarettes  . Quit date: 03/26/2009  Smokeless tobacco  . Never Used    Labs: Recent Review Flowsheet Data    Labs for ITP Cardiac and Pulmonary Rehab Latest Ref Rng 05/24/2015 05/24/2015 05/24/2015 05/31/2015 06/01/2015   Cholestrol 0 - 200 mg/dL - - - 83 82   LDLCALC 0 - 99 mg/dL - - - 36 26   HDL >40 mg/dL - - - 26(L) 24(L)   Trlycerides <150 mg/dL - - - 104 158(H)   PHART 7.350 - 7.450 - 7.297(L) - - -   PCO2ART 35.0 - 45.0 mmHg - 43.4 - - -   HCO3 20.0 - 24.0 mEq/L - 21.2 - - -   TCO2 0 - 100 mmol/L _0 - -   ACIDBASEDEF 0.0 - 2.0 mmol/L - 5.0(H) - - -    O2SAT - - 91.0 - - -       Exercise Target Goals:    Exercise Program Goal: Individual exercise prescription set with THRR, safety & activity barriers. Participant demonstrates ability to understand and report RPE using BORG scale, to self-measure pulse accurately, and to acknowledge the importance of the exercise prescription.  Exercise Prescription Goal: Starting with aerobic activity 30 plus minutes a day, 3 days per week for initial exercise prescription. Provide home exercise prescription and guidelines that participant acknowledges understanding prior to discharge.  Activity Barriers & Risk Stratification:     Activity Barriers & Cardiac Risk Stratification - 07/02/15 1348    Activity Barriers & Cardiac Risk Stratification   Activity Barriers Back Problems   Cardiac Risk Stratification High      6 Minute Walk:     6 Minute Walk      07/02/15 1444       6 Minute Walk   Phase Initial     Distance 1175 feet     Walk Time 6 minutes     RPE 12     Perceived Dyspnea  0     Symptoms No     Resting HR 98 bpm     Resting BP 138/62 mmHg     Max Ex. HR 102 bpm     Max Ex. BP 140/78 mmHg        Initial Exercise Prescription:     Initial Exercise Prescription - 07/02/15 1500    Date of Initial Exercise Prescription   Date 07/02/15   Treadmill   MPH 2.5   Grade 0   Minutes 15   Bike   Level 1   Minutes 15   Recumbant Bike   Level 2   Watts 30   Minutes 15   NuStep   Level 3   Watts 40   Minutes 15   Arm Ergometer   Level 1   Watts 10   Minutes 15   Arm/Foot Ergometer   Level 1   Watts 12   Minutes 15   Cybex   Level 2   RPM 40   Minutes 15   Recumbant Elliptical   Level 1   Watts 20   Minutes 15   Elliptical   Level 1   Speed 2.5   Minutes 5   REL-XR   Level 3   Watts 30   Minutes 15   T5 Nustep  Level 2   Watts 20   Minutes 15   Biostep-RELP   Level 3   Watts 30   Minutes 15   Prescription Details   Frequency (times per  week) 3   Duration Progress to 30 minutes of continuous aerobic without signs/symptoms of physical distress   Intensity   THRR REST +  30   Ratings of Perceived Exertion 11-15   Perceived Dyspnea 2-4   Progression Continue progressive overload as per policy without signs/symptoms or physical distress.   Resistance Training   Training Prescription Yes   Weight 2   Reps 10-15      Exercise Prescription Changes:     Exercise Prescription Changes      07/09/15 1439 07/18/15 1600 08/02/15 1249 08/27/15 1100 09/05/15 1700   Exercise Review   Progression Yes Yes Yes No  Absent since 08/09/15 No  Absent since 08/09/15   Response to Exercise   Blood Pressure (Admit) 112/72 mmHg  130/68 mmHg 128/62 mmHg    Blood Pressure (Exercise) 158/62 mmHg  148/70 mmHg 138/76 mmHg    Blood Pressure (Exit) 128/70 mmHg  122/60 mmHg 124/60 mmHg    Heart Rate (Admit) 82 bpm  61 bpm 65 bpm    Heart Rate (Exercise) 96 bpm  97 bpm 93 bpm    Heart Rate (Exit) 81 bpm  64 bpm 64 bpm    Rating of Perceived Exertion (Exercise) _0 Symptoms Back pain at times  No new pain other than chronic back pain No new pain other than chronic back pain    Comments First day of exercise! Patient was oriented to the gym and the equipment functions and settings. Procedures and policies of the gym were outlined and explained. The patient's individual exercise prescription and treatment plan were reviewed with them. All starting workloads were established based on the results of the functional testing  done at the initial intake visit. The plan for exercise progression was also introduced and progression will be customized based on the patient's performance and goals.   Darious is starting to plateau on some of his exercise progressions. Progression will be slower now that he is working close to his full capacity on each machine. He can continuously exercise for the entire class. We will now use interval training to progress him  with his exercise intensity.  Lewi has been absent since 08/09/15. His workloads may need to be re-evaluated based on his current PA level.     Frequency     Add 2 additional days to program exercise sessions.  Dmitriy plans to walk at home or go to gym to walk on TM.     Duration Progress to 30 minutes of continuous aerobic without signs/symptoms of physical distress Progress to 30 minutes of continuous aerobic without signs/symptoms of physical distress Progress to 30 minutes of continuous aerobic without signs/symptoms of physical distress Progress to 30 minutes of continuous aerobic without signs/symptoms of physical distress Progress to 30 minutes of continuous aerobic without signs/symptoms of physical distress   Intensity Rest + 30 Rest + 30 Rest + 30 Rest + 30 Rest + 30   Progression Continue progressive overload as per policy without signs/symptoms or physical distress. Continue progressive overload as per policy without signs/symptoms or physical distress. Continue progressive overload as per policy without signs/symptoms or physical distress. Continue progressive overload as per policy without signs/symptoms or physical distress. Continue progressive overload as per policy without signs/symptoms or physical  distress.   Resistance Training   Training Prescription _0    Weight _1 Reps 10-15 10-15 10-15 10-15 10-15   Interval Training   Interval Training No No Yes Yes Yes   Equipment   --  BioStep --  BioStep --  BioStep   Comments   Level 4 for 3 minutes, Level 6 for 1 minute. Repeat for 10-15 minutes Level 4 for 3 minutes, Level 6 for 1 minute. Repeat for 10-15 minutes Level 4 for 3 minutes, Level 6 for 1 minute. Repeat for 10-15 minutes   Treadmill   MPH   2.8 2.8 2.8   Grade   0 0 0   Minutes   _2 Recumbant Bike   Level _3 RPM 50 50 50 50 50   Minutes _4 NuStep   Level _5 Watts 50 50 50 50 50   Minutes _6 Biostep-RELP   Level  _7 Watts  40 40 40 40   Minutes  _8 09/12/15 1600           Response to Exercise   Symptoms None       Comments Nissim has maintained much regular attendance and has resumed previous workloads. He is also being conscious of exercising outside of class and reported that he has exercised on the "off" days of class each day this week. He is very proud of that.       Frequency Add 2 additional days to program exercise sessions.  Zakar is walking at home on days he doesn't come to rehab       Duration Progress to 30 minutes of continuous aerobic without signs/symptoms of physical distress       Intensity Rest + 30       Progression Continue progressive overload as per policy without signs/symptoms or physical distress.       Resistance Training   Training Prescription Yes       Weight 3       Reps 10-15       Interval Training   Interval Training Yes       Equipment --  BioStep       Comments Level 4 for 3 minutes, Level 6 for 1 minute. Repeat for 10-15 minutes       Treadmill   MPH 2.8       Grade 0       Minutes 20       Recumbant Bike   Level 3       RPM 50       Minutes 20       NuStep   Level 3       Watts 50       Minutes 20       Biostep-RELP   Level 6       Watts 40       Minutes 20          Discharge Exercise Prescription (Final Exercise Prescription Changes):     Exercise Prescription Changes - 09/12/15 1600    Response to Exercise   Symptoms None   Comments Ahmet has maintained much regular attendance and has resumed previous workloads. He is also being conscious of  exercising outside of class and reported that he has exercised on the "off" days of class each day this week. He is very proud of that.   Frequency Add 2 additional days to program exercise sessions.  Estill is walking at home on days he doesn't come to rehab   Duration Progress to 30 minutes of continuous aerobic without signs/symptoms of  physical distress   Intensity Rest + 30   Progression Continue progressive overload as per policy without signs/symptoms or physical distress.   Resistance Training   Training Prescription Yes   Weight 3   Reps 10-15   Interval Training   Interval Training Yes   Equipment --  BioStep   Comments Level 4 for 3 minutes, Level 6 for 1 minute. Repeat for 10-15 minutes   Treadmill   MPH 2.8   Grade 0   Minutes 20   Recumbant Bike   Level 3   RPM 50   Minutes 20   NuStep   Level 3   Watts 50   Minutes 20   Biostep-RELP   Level 6   Watts 40   Minutes 20      Nutrition:  Target Goals: Understanding of nutrition guidelines, daily intake of sodium <1568m, cholesterol <2014m calories 30% from fat and 7% or less from saturated fats, daily to have 5 or more servings of fruits and vegetables.  Biometrics:     Pre Biometrics - 07/02/15 1447    Pre Biometrics   Waist Circumference 43 inches   Hip Circumference 38 inches   Waist to Hip Ratio 1.13 %       Nutrition Therapy Plan and Nutrition Goals:     Nutrition Therapy & Goals - 07/02/15 1451    Nutrition Therapy   Drug/Food Interactions Statins/Certain Fruits   Personal Nutrition Goals   Personal Goal #1 Cont. to eat heart healthy and eat to control his diabetes.    Comments GeMattheuseports that his wife already has him on a heart healthy and a diabetic eating plan. GeBazilaid he did not feel he needed to meet individually with the Cardiac Rehab Registered dietician.    Intervention Plan   Intervention Using nutrition plan and personal goals to gain a healthy nutrition lifestyle. Add exercise as prescribed.      Nutrition Discharge: Rate Your Plate Scores:   Nutrition Goals Re-Evaluation:     Nutrition Goals Re-Evaluation      08/29/15 1605           Personal Goal #1 Re-Evaluation   Goal Progress Seen Yes       Comments GeNdrewaid that his wife had shoulder surgery 2 weeks ago and people are bringing apple  pies and coconut pies to his house so it is hard to eat correctly at times but he says he usually eats healthy to control his blood sugars in the 100 range.           Psychosocial: Target Goals: Acknowledge presence or absence of depression, maximize coping skills, provide positive support system. Participant is able to verbalize types and ability to use techniques and skills needed for reducing stress and depression.  Initial Review & Psychosocial Screening:     Initial Psych Review & Screening - 07/02/15 [place]   Comments GeDwaneports that his wife feeds him good food based on a diabetic diet and now a heart diet.    Barriers   Psychosocial  barriers to participate in program There are no identifiable barriers or psychosocial needs.   Screening Interventions   Interventions Encouraged to exercise      Quality of Life Scores:     Quality of Life - 09/21/15 1016    Quality of Life Scores   Health/Function Post 24.87 %   Health/Function % Change 21.9 %   Socioeconomic Post 25.36 %   Socioeconomic % Change -4 %   Psych/Spiritual Post 24.57 %   Psych/Spiritual % Change -9 %   Family Post 26.4 %   Family % Change -8.3 %  Darrill's wife had shoulder surgery recently.    GLOBAL Post 25.13 %   GLOBAL % Change 3.7 %      PHQ-9:     Recent Review Flowsheet Data    Depression screen [facility] 2/9 09/21/2015 07/02/2015 06/21/2015   Decreased Interest 0 2 0   Down, Depressed, Hopeless 0 1 0   PHQ - 2 Score 0 3 0   Altered sleeping 0 0 -   Tired, decreased energy 0 2 -   Change in appetite 0 0 -   Feeling bad or failure about yourself  0 0 -   Trouble concentrating 0 0 -   Moving slowly or fidgety/restless 0 1 -   Suicidal thoughts 0 0 -   PHQ-9 Score 0 6 -   Difficult doing work/chores - Somewhat difficult -      Psychosocial Evaluation and Intervention:     Psychosocial Evaluation - 08/01/15 1658    Psychosocial Evaluation &  Interventions   Comments Met with Mr. Hammond today for psychosocial evaluation reporting triple bypass on 10/12.  He has a strong support system with a spouse of 15 years and (2) adult daughters who live close by.  Mr. Jerilynn Mages has diabetes and sleep apnea - but is sleeping fine and his appetite has returned recently.  He denies a history of depression or anxiety or curent symptoms.  He is typically in a positive mood and reports minimal stress in his life currently.  Mr. Jerilynn Mages has goals to continue exercising without being tired, and to lose some more weight while in this program.  His spouse has reported positive benefits already seen since he started this program stating he has "more stamina now  than he has had in the past few years."  Counselor commended Mr. Jerilynn Mages for his progress and his commitment to consistent exercise.       Psychosocial Re-Evaluation:     Psychosocial Re-Evaluation      08/29/15 1608           Psychosocial Re-Evaluation   Interventions Encouraged to attend Cardiac Rehabilitation for the exercise       Comments Daden has been having to take care of his wife who had shoulder surgery. People are brining food in but no always the healthiest.           Vocational Rehabilitation: Provide vocational rehab assistance to qualifying candidates.   Vocational Rehab Evaluation & Intervention:     Vocational Rehab - 07/02/15 1350    Initial Vocational Rehab Evaluation & Intervention   Assessment shows need for Vocational Rehabilitation No      Education: Education Goals: Education classes will be provided on a weekly basis, covering required topics. Participant will state understanding/return demonstration of topics presented.  Learning Barriers/Preferences:   Education Topics: General Nutrition Guidelines/Fats and Fiber: -Group instruction provided by verbal, written material, models and posters to present the  general guidelines for heart healthy nutrition. Gives an  explanation and review of dietary fats and fiber.          Cardiac Rehab from 09/05/2015 in [facility] Cardiac and Pulmonary Rehab   Date  07/16/15   Educator  PI   Instruction Review Code  2- meets goals/outcomes      Controlling Sodium/Reading Food Labels: -Group verbal and written material supporting the discussion of sodium use in heart healthy nutrition. Review and explanation with models, verbal and written materials for utilization of the food label.      Cardiac Rehab from 09/05/2015 in [facility] Cardiac and Pulmonary Rehab   Date  07/23/15   Educator  PI   Instruction Review Code  2- meets goals/outcomes      Exercise Physiology & Risk Factors: - Group verbal and written instruction with models to review the exercise physiology of the cardiovascular system and associated critical values. Details cardiovascular disease risk factors and the goals associated with each risk factor.   Aerobic Exercise & Resistance Training: - Gives group verbal and written discussion on the health impact of inactivity. On the components of aerobic and resistive training programs and the benefits of this training and how to safely progress through these programs.      Cardiac Rehab from 09/05/2015 in [facility] Cardiac and Pulmonary Rehab   Date  08/27/15   Educator  RM   Instruction Review Code  2- meets goals/outcomes      Flexibility, Balance, General Exercise Guidelines: - Provides group verbal and written instruction on the benefits of flexibility and balance training programs. Provides general exercise guidelines with specific guidelines to those with heart or lung disease. Demonstration and skill practice provided.      Cardiac Rehab from 09/05/2015 in [facility] Cardiac and Pulmonary Rehab   Date  08/27/15   Educator  RM   Instruction Review Code  2- meets goals/outcomes      Stress Management: - Provides group verbal and written instruction about the health risks of elevated stress, cause of high stress,  and healthy ways to reduce stress.   Depression: - Provides group verbal and written instruction on the correlation between heart/lung disease and depressed mood, treatment options, and the stigmas associated with seeking treatment.      Cardiac Rehab from 09/05/2015 in [facility] Cardiac and Pulmonary Rehab   Date  07/25/15   Educator  Juliann Pulse C   Instruction Review Code  2- meets goals/outcomes      Anatomy & Physiology of the Heart: - Group verbal and written instruction and models provide basic cardiac anatomy and physiology, with the coronary electrical and arterial systems. Review of: AMI, Angina, Valve disease, Heart Failure, Cardiac Arrhythmia, Pacemakers, and the ICD.      Cardiac Rehab from 09/05/2015 in [facility] Cardiac and Pulmonary Rehab   Date  09/03/15   Educator  SB   Instruction Review Code  2- meets goals/outcomes      Cardiac Procedures: - Group verbal and written instruction and models to describe the testing methods done to diagnose heart disease. Reviews the outcomes of the test results. Describes the treatment choices: Medical Management, Angioplasty, or Coronary Bypass Surgery.   Cardiac Medications: - Group verbal and written instruction to review commonly prescribed medications for heart disease. Reviews the medication, class of the drug, and side effects. Includes the steps to properly store meds and maintain the prescription regimen.   Go Sex-Intimacy & Heart Disease, Get SMART - Goal Setting: - Group  verbal and written instruction through game format to discuss heart disease and the return to sexual intimacy. Provides group verbal and written material to discuss and apply goal setting through the application of the S.M.A.R.T. Method.   Other Matters of the Heart: - Provides group verbal, written materials and models to describe Heart Failure, Angina, Valve Disease, and Diabetes in the realm of heart disease. Includes description of the disease process and treatment  options available to the cardiac patient.      Cardiac Rehab from 09/05/2015 in [facility] Cardiac and Pulmonary Rehab   Date  07/11/15   Educator  DW   Instruction Review Code  2- meets goals/outcomes      Exercise & Equipment Safety: - Individual verbal instruction and demonstration of equipment use and safety with use of the equipment.      Cardiac Rehab from 09/05/2015 in [facility] Cardiac and Pulmonary Rehab   Date  07/02/15   Educator  C. EnterkinRN   Instruction Review Code  1- partially meets, needs review/practice      Infection Prevention: - Provides verbal and written material to individual with discussion of infection control including proper hand washing and proper equipment cleaning during exercise session.      Cardiac Rehab from 09/05/2015 in [facility] Cardiac and Pulmonary Rehab   Date  07/02/15   Educator  C. Canby   Instruction Review Code  1- partially meets, needs review/practice      Falls Prevention: - Provides verbal and written material to individual with discussion of falls prevention and safety.      Cardiac Rehab from 09/05/2015 in [facility] Cardiac and Pulmonary Rehab   Date  07/02/15   Educator  C. Meadowood   Instruction Review Code  2- meets goals/outcomes      Diabetes: - Individual verbal and written instruction to review signs/symptoms of diabetes, desired ranges of glucose level fasting, after meals and with exercise. Advice that pre and post exercise glucose checks will be done for 3 sessions at entry of program.      Cardiac Rehab from 09/05/2015 in [facility] Cardiac and Pulmonary Rehab   Date  07/02/15   Educator  C. Vega   Instruction Review Code  2- meets goals/outcomes       Knowledge Questionnaire Score:     Knowledge Questionnaire Score - 09/21/15 1020    Knowledge Questionnaire Score   Post Score 25      Personal Goals and Risk Factors at Admission:     Personal Goals and Risk Factors at Admission - 07/02/15 1447    Personal Goals  and Risk Factors on Admission   Increase Aerobic Exercise and Physical Activity Yes   Intervention While in program, learn and follow the exercise prescription taught. Start at a low level workload and increase workload after able to maintain previous level for 30 minutes. Increase time before increasing intensity.  Goal time to achieve goal by 36 sessions.    Diabetes Yes   Goal Blood glucose control identified by blood glucose values, HgbA1C. Participant verbalizes understanding of the signs/symptoms of hyper/hypo glycemia, proper foot care and importance of medication and nutrition plan for blood glucose control.   Intervention Provide nutrition & aerobic exercise along with prescribed medications to achieve blood glucose in normal ranges: Fasting 65-99 mg/dL   Hypertension Yes   Goal Participant will see blood pressure controlled within the values of 140/21m/Hg or within value directed by their physician.   Intervention Provide nutrition & aerobic exercise along  with prescribed medications to achieve BP 140/90 or less.   Lipids Yes   Goal Cholesterol controlled with medications as prescribed, with individualized exercise RX and with personalized nutrition plan. Value goals: LDL < 75m, HDL > 450m Participant states understanding of desired cholesterol values and following prescriptions.   Intervention Provide nutrition & aerobic exercise along with prescribed medications to achieve LDL <7012mHDL >40m3m    Personal Goals and Risk Factors Review:      Goals and Risk Factor Review      07/09/15 1737 07/25/15 1737 08/27/15 1151 08/29/15 1606 09/12/15 1658   Increase Aerobic Exercise and Physical Activity   Goals Progress/Improvement seen  No  No     Comments Today was first exsecise session.  Spoke with Bradley [name] his overall goals and short term goal ssetting.  He does want to return to his golf game. He states that will be awhile before that happens. Also he wants to return to  exercising 30 minutes 3 times a day and doing weights. Short term goal is to attend class 3 days a week and follow the exerciseprescription that he is given.   Bradley [name] been absent since 08/09/15. His exercise goals and rx. will need to be re-evaluated based on his current PA level.      Diabetes   Goal     Blood glucose control identified by blood glucose values, HgbA1C. Participant verbalizes understanding of the signs/symptoms of hyper/hypo glycemia, proper foot care and importance of medication and nutrition plan for blood glucose control.   Progress seen towards goals  Yes  Yes Yes   Comments  GeorEmoryates he is changing his eating habits more fruit and vegs, less fried foods, decreased his starches, added more beans/legumes.  Bradley [name] the past 2 weeks have been tough since his wife had shoulder surgery and she usually cooks healthy for him. He asked about our cafeteria and I said that they have green dots for healthy food.  Recent HgbA1C 7.2%    GeorSylvaintinues to work on eating habits and exercise. Limited some currently because his wife had surgery and is not cooking the "healthy food".   Hypertension   Goal     Participant will see blood pressure controlled within the values of 140/90mm8mor within value directed by their physician.   Progress seen toward goals  Yes  Yes    Comments  GeorgElimtes he is changing his eating habits more fruit and vegs, less fried foods, decreased his starches, added more beans/legumes. He is keeping an eye on his sodium intake.  Blood pressure has been stable -see telemetry sheets.  BP remains in good range when in class. Geroge reported it was elevated in MD office today.  He has a med change and a new med prescribed.   Abnormal Lipids   Progress seen towards goals  Yes  Yes    Comments  GeorgDeraktes he is changing his eating habits more fruit and vegs, less fried foods, decreased his starches, added more beans/legumes. He is progressing with his  exercise program  NornaCordarrius Coadbeen trying to eat more fruits andvegetables even when they go out to eat.       09/12/15 1701           Abnormal Lipids   Comments COntinues to work on exercise and eating the right foods amd amounts          Personal Goals Discharge (  Final Personal Goals and Risk Factors Review):      Goals and Risk Factor Review - 09/12/15 1701    Abnormal Lipids   Comments COntinues to work on exercise and eating the right foods amd amounts      ITP Comments:     ITP Comments      07/17/15 1318 08/12/15 1408 09/07/15 1227 09/19/15 1809 09/21/15 1021   ITP Comments 30 day review preparation  Continue with ITP   New to program only a few visits completed Ready for 30 day review.  Continue with ITP Ready fro 30 day review, Continue with ITP.  Daltyn reported his MD has changed some of his medications.  He is no longer on Diltiazem.  Metoprolol has been increased to 115m twice daily.  Losartan-HCTZ 50-12.5 mg is one tablet once a day.  Plavix 75 mg once a day.  These changes have already been made to his medication list.  See Medication List for Current Medications.   Baer's wife had shoulder surgery so he has been having to help take care of her also.       Comments: Olof's wife had shoulder surgery so he has been having to help take care of her also.

## 2015-09-26 ENCOUNTER — Encounter: Payer: PPO | Admitting: *Deleted

## 2015-09-26 DIAGNOSIS — Z951 Presence of aortocoronary bypass graft: Secondary | ICD-10-CM

## 2015-09-26 NOTE — Progress Notes (Signed)
Daily Session Note  Patient Details  Name: ARMONTE TORTORELLA MRN: [id number] Date of Birth: [birth year] Referring Provider:  Belva Crome, MD  Encounter Date: 09/26/2015  Check In:     Session Check In - 09/26/15 1622    Check-In   Location [facility]   Staff Present Candiss Norse, MS, ACSM CEP, Exercise Physiologist;Namari Breton, Jerl Santos, RN, BSN   Supervising physician immediately available to respond to emergencies See telemetry face sheet for immediately available ER MD   Medication changes reported     No   Fall or balance concerns reported    No   Warm-up and Cool-down Performed on first and last piece of equipment   Resistance Training Performed Yes   VAD Patient? No   Pain Assessment   Currently in Pain? No/denies         Goals Met:  Independence with exercise equipment Exercise tolerated well Strength training completed today  Goals Unmet:  Not Applicable  Goals Comments: Patient completed exercise prescription and all exercise goals during rehab session. The exercise was tolerated well and the patient is progressing in the program.    Dr. Emily Filbert is Medical Director for [facility].

## 2015-09-27 ENCOUNTER — Encounter: Payer: PPO | Admitting: *Deleted

## 2015-09-27 NOTE — Progress Notes (Signed)
Cardiac Individual Treatment Plan  Patient Details  Name: Bradley [name] MRN: [id number] Date of Birth: 1945-12-07 Referring Provider:  Gildardo Cranker, MD  Initial Encounter Date:    Visit Diagnosis: No diagnosis found.  Patient's Home Medications on Admission:  Current outpatient prescriptions:  .  Ascorbic Acid (VITAMIN C) 100 MG tablet, Take 100 mg by mouth daily., Disp: , Rfl:  .  aspirin EC 81 MG EC tablet, Take 1 tablet (81 mg total) by mouth daily., Disp: , Rfl:  .  atorvastatin (LIPITOR) 40 MG tablet, Take 40 mg by mouth daily.  , Disp: , Rfl:  .  Calcium Carbonate-Vitamin D (CALCIUM-D) 600-400 MG-UNIT TABS, Take 1 tablet by mouth daily. , Disp: , Rfl:  .  Cholecalciferol (D-3-5) 5000 UNITS capsule, Take 5,000 Units by mouth daily., Disp: , Rfl:  .  clopidogrel (PLAVIX) 75 MG tablet, Take 1 tablet (75 mg total) by mouth daily., Disp: 1 tablet, Rfl: 0 .  dexlansoprazole (DEXILANT) 60 MG capsule, Take 60 mg by mouth daily., Disp: , Rfl:  .  glimepiride (AMARYL) 2 MG tablet, Take 2 mg by mouth daily before breakfast.  , Disp: , Rfl:  .  Hydrocortisone Acetate (PROCTOSOL RE), Place rectally., Disp: , Rfl:  .  IRON, FERROUS GLUCONATE, PO, Take 40 mg by mouth 2 (two) times daily., Disp: , Rfl:  .  Liraglutide (VICTOZA [place]), Inject 1.8 mg into the skin daily. , Disp: , Rfl:  .  losartan-hydrochlorothiazide (HYZAAR) 50-12.5 MG tablet, Take 1 tablet by mouth daily., Disp: 90 tablet, Rfl: 3 .  metFORMIN (GLUCOPHAGE-XR) 500 MG 24 hr tablet, Take 2 tablets (1,000 mg total) by mouth 2 (two) times daily., Disp: , Rfl:  .  metoprolol (LOPRESSOR) 100 MG tablet, Take 1 tablet (100 mg total) by mouth 2 (two) times daily., Disp: 180 tablet, Rfl: 3 .  Multiple Vitamin (MULTIVITAMIN) capsule, Take 1 capsule by mouth daily.  , Disp: , Rfl:  .  traMADol (ULTRAM) 50 MG tablet, Take 1 tablet (50 mg total) by mouth every 6 (six) hours as needed for moderate pain., Disp: 30 tablet, Rfl: 0  Past Medical  History: Past Medical History  Diagnosis Date  . Hyperlipidemia   . Hypertension   . Diabetes mellitus age 71  . Peripheral vascular disease ([name])   . Arthritis   . Reflux esophagitis   . Stroke [facility]) 1987    Right brain stroke  . Neuropathy ([name]) 2013  . Osteoporosis 2013  . Sleep apnea     uses CPAP  . GERD (gastroesophageal reflux disease)   . H/O hiatal hernia   . Irritable bowel syndrome (IBS)   . Myocardial infarction [facility])     silent inferior MI; patient denies MI history (03/17/13)   . Anemia   . Carotid artery occlusion     a. Carotid US 10/16: RICA 60-79%, L CEA patent with 1-39% stenosis  . Coronary artery disease     a. Myoview 9/16:  EF 52%, inferior fixed defect consistent with diaphragmatic attenuation, intermediate risk secondary to poor exercise tolerance and symptoms during stress;  b. LHC 05/17/2015 90% mid LAD, 80% ost D2, 75% mid RCA, 35% prox RCA. >> S/p CABG  . Diverticulitis   . Wears glasses   . PAF (paroxysmal atrial fibrillation) ([name])     post CABG; Amiodarone stopped 2/2 wheezing;   . History of echocardiogram     a. Echo 9/16: GLS -15.2%, EF 55-60%, grade 1 diastolic dysfunction, normal wall motion,  aortic sclerosis, dilated aortic root 39 mm, atrial septal lipomatous hypertrophy    Tobacco Use: History  Smoking status  . Former Smoker -- 2.00 packs/day for 40 years  . Types: Cigarettes  . Quit date: 03/26/2009  Smokeless tobacco  . Never Used    Labs: Recent Review Flowsheet Data    Labs for ITP Cardiac and Pulmonary Rehab Latest Ref Rng 05/24/2015 05/24/2015 05/24/2015 05/31/2015 06/01/2015   Cholestrol 0 - 200 mg/dL - - - 83 82   LDLCALC 0 - 99 mg/dL - - - 36 26   HDL >40 mg/dL - - - 26(L) 24(L)   Trlycerides <150 mg/dL - - - 104 158(H)   PHART 7.350 - 7.450 - 7.297(L) - - -   PCO2ART 35.0 - 45.0 mmHg - 43.4 - - -   HCO3 20.0 - 24.0 mEq/L - 21.2 - - -   TCO2 0 - 100 mmol/L '20 22 19 '$ - -   ACIDBASEDEF 0.0 - 2.0 mmol/L - 5.0(H) - - -    O2SAT - - 91.0 - - -       Exercise Target Goals:    Exercise Program Goal: Individual exercise prescription set with THRR, safety & activity barriers. Participant demonstrates ability to understand and report RPE using BORG scale, to self-measure pulse accurately, and to acknowledge the importance of the exercise prescription.  Exercise Prescription Goal: Starting with aerobic activity 30 plus minutes a day, 3 days per week for initial exercise prescription. Provide home exercise prescription and guidelines that participant acknowledges understanding prior to discharge.  Activity Barriers & Risk Stratification:     Activity Barriers & Cardiac Risk Stratification - 07/02/15 1348    Activity Barriers & Cardiac Risk Stratification   Activity Barriers Back Problems   Cardiac Risk Stratification High      6 Minute Walk:     6 Minute Walk      07/02/15 1444       6 Minute Walk   Phase Initial     Distance 1175 feet     Walk Time 6 minutes     RPE 12     Perceived Dyspnea  0     Symptoms No     Resting HR 98 bpm     Resting BP 138/62 mmHg     Max Ex. HR 102 bpm     Max Ex. BP 140/78 mmHg        Initial Exercise Prescription:     Initial Exercise Prescription - 07/02/15 1500    Date of Initial Exercise Prescription   Date 07/02/15   Treadmill   MPH 2.5   Grade 0   Minutes 15   Bike   Level 1   Minutes 15   Recumbant Bike   Level 2   Watts 30   Minutes 15   NuStep   Level 3   Watts 40   Minutes 15   Arm Ergometer   Level 1   Watts 10   Minutes 15   Arm/Foot Ergometer   Level 1   Watts 12   Minutes 15   Cybex   Level 2   RPM 40   Minutes 15   Recumbant Elliptical   Level 1   Watts 20   Minutes 15   Elliptical   Level 1   Speed 2.5   Minutes 5   REL-XR   Level 3   Watts 30   Minutes 15   T5 Nustep  Level 2   Watts 20   Minutes 15   Biostep-RELP   Level 3   Watts 30   Minutes 15   Prescription Details   Frequency (times per  week) 3   Duration Progress to 30 minutes of continuous aerobic without signs/symptoms of physical distress   Intensity   THRR REST +  30   Ratings of Perceived Exertion 11-15   Perceived Dyspnea 2-4   Progression Continue progressive overload as per policy without signs/symptoms or physical distress.   Resistance Training   Training Prescription Yes   Weight 2   Reps 10-15      Exercise Prescription Changes:     Exercise Prescription Changes      07/09/15 1439 07/18/15 1600 08/02/15 1249 08/27/15 1100 09/05/15 1700   Exercise Review   Progression Yes Yes Yes No  Absent since 08/09/15 No  Absent since 08/09/15   Response to Exercise   Blood Pressure (Admit) 112/72 mmHg  130/68 mmHg 128/62 mmHg    Blood Pressure (Exercise) 158/62 mmHg  148/70 mmHg 138/76 mmHg    Blood Pressure (Exit) 128/70 mmHg  122/60 mmHg 124/60 mmHg    Heart Rate (Admit) 82 bpm  61 bpm 65 bpm    Heart Rate (Exercise) 96 bpm  97 bpm 93 bpm    Heart Rate (Exit) 81 bpm  64 bpm 64 bpm    Rating of Perceived Exertion (Exercise) _0 Symptoms Back pain at times  No new pain other than chronic back pain No new pain other than chronic back pain    Comments First day of exercise! Patient was oriented to the gym and the equipment functions and settings. Procedures and policies of the gym were outlined and explained. The patient's individual exercise prescription and treatment plan were reviewed with them. All starting workloads were established based on the results of the functional testing  done at the initial intake visit. The plan for exercise progression was also introduced and progression will be customized based on the patient's performance and goals.   Darious is starting to plateau on some of his exercise progressions. Progression will be slower now that he is working close to his full capacity on each machine. He can continuously exercise for the entire class. We will now use interval training to progress him  with his exercise intensity.  Lewi has been absent since 08/09/15. His workloads may need to be re-evaluated based on his current PA level.     Frequency     Add 2 additional days to program exercise sessions.  Dmitriy plans to walk at home or go to gym to walk on TM.     Duration Progress to 30 minutes of continuous aerobic without signs/symptoms of physical distress Progress to 30 minutes of continuous aerobic without signs/symptoms of physical distress Progress to 30 minutes of continuous aerobic without signs/symptoms of physical distress Progress to 30 minutes of continuous aerobic without signs/symptoms of physical distress Progress to 30 minutes of continuous aerobic without signs/symptoms of physical distress   Intensity Rest + 30 Rest + 30 Rest + 30 Rest + 30 Rest + 30   Progression Continue progressive overload as per policy without signs/symptoms or physical distress. Continue progressive overload as per policy without signs/symptoms or physical distress. Continue progressive overload as per policy without signs/symptoms or physical distress. Continue progressive overload as per policy without signs/symptoms or physical distress. Continue progressive overload as per policy without signs/symptoms or physical  distress.   Resistance Training   Training Prescription Yes Yes Yes Yes Yes   Weight '2 3 3 3 3   '$ Reps 10-15 10-15 10-15 10-15 10-15   Interval Training   Interval Training No No Yes Yes Yes   Equipment   --  BioStep --  BioStep --  BioStep   Comments   Level 4 for 3 minutes, Level 6 for 1 minute. Repeat for 10-15 minutes Level 4 for 3 minutes, Level 6 for 1 minute. Repeat for 10-15 minutes Level 4 for 3 minutes, Level 6 for 1 minute. Repeat for 10-15 minutes   Treadmill   MPH   2.8 2.8 2.8   Grade   0 0 0   Minutes   '20 20 20   '$ Recumbant Bike   Level '3 3 3 3 3   '$ RPM 50 50 50 50 50   Minutes '20 20 20 20 20   '$ NuStep   Level '3 3 3 3 3   '$ Watts 50 50 50 50 50   Minutes '20 20 20  20 20   '$ Biostep-RELP   Level  '6 6 6 6   '$ Watts  40 40 40 40   Minutes  '20 20 20 20     '$ 09/12/15 1600 09/25/15 0700         Exercise Review   Progression  Yes      Response to Exercise   Blood Pressure (Admit)  132/80 mmHg      Blood Pressure (Exercise)  144/80 mmHg      Blood Pressure (Exit)  114/65 mmHg      Heart Rate (Admit)  81 bpm      Heart Rate (Exercise)  104 bpm      Heart Rate (Exit)  85 bpm      Rating of Perceived Exertion (Exercise)  13      Symptoms None None      Comments Coda has maintained much regular attendance and has resumed previous workloads. He is also being conscious of exercising outside of class and reported that he has exercised on the "off" days of class each day this week. He is very proud of that. Dimetri wants to focus the rest of his time in rehab on his walking. We have progressed him on the TM up to his max walking speed and provided further stimulus through an incline. We has done well with the ex. increases and has completed all progression suggestions without incident. He maintains exercise at home on days he does not come to class. We will follow up with him in the coming weeks on what we can do to provide him challenges and help him meet his exercise goals.       Frequency Add 2 additional days to program exercise sessions.  Elizah is walking at home on days he doesn't come to rehab Add 2 additional days to program exercise sessions.  Nahom is walking at home on days he doesn't come to rehab      Duration Progress to 30 minutes of continuous aerobic without signs/symptoms of physical distress Progress to 30 minutes of continuous aerobic without signs/symptoms of physical distress      Intensity Rest + 30 Rest + 30      Progression Continue progressive overload as per policy without signs/symptoms or physical distress. Continue progressive overload as per policy without signs/symptoms or physical distress.      Resistance Training   Training  Prescription Yes Yes  Weight 3 4      Reps 10-15 10-15      Interval Training   Interval Training Yes Yes      Equipment --  BioStep --  BioStep      Comments Level 4 for 3 minutes, Level 6 for 1 minute. Repeat for 10-15 minutes Level 4 for 3 minutes, Level 6 for 1 minute. Repeat for 10-15 minutes      Treadmill   MPH 2.8 3      Grade 0 2      Minutes 20 35      Recumbant Bike   Level 3 3      RPM 50 50      Minutes 20 20      NuStep   Level 3 3      Watts 50 50      Minutes 20 20      Biostep-RELP   Level 6 6      Watts 40 40      Minutes 20 20         Discharge Exercise Prescription (Final Exercise Prescription Changes):     Exercise Prescription Changes - 09/25/15 0700    Exercise Review   Progression Yes   Response to Exercise   Blood Pressure (Admit) 132/80 mmHg   Blood Pressure (Exercise) 144/80 mmHg   Blood Pressure (Exit) 114/65 mmHg   Heart Rate (Admit) 81 bpm   Heart Rate (Exercise) 104 bpm   Heart Rate (Exit) 85 bpm   Rating of Perceived Exertion (Exercise) 13   Symptoms None   Comments Genaro wants to focus the rest of his time in rehab on his walking. We have progressed him on the TM up to his max walking speed and provided further stimulus through an incline. We has done well with the ex. increases and has completed all progression suggestions without incident. He maintains exercise at home on days he does not come to class. We will follow up with him in the coming weeks on what we can do to provide him challenges and help him meet his exercise goals.    Frequency Add 2 additional days to program exercise sessions.  Kalep is walking at home on days he doesn't come to rehab   Duration Progress to 30 minutes of continuous aerobic without signs/symptoms of physical distress   Intensity Rest + 30   Progression Continue progressive overload as per policy without signs/symptoms or physical distress.   Resistance Training   Training Prescription Yes    Weight 4   Reps 10-15   Interval Training   Interval Training Yes   Equipment --  BioStep   Comments Level 4 for 3 minutes, Level 6 for 1 minute. Repeat for 10-15 minutes   Treadmill   MPH 3   Grade 2   Minutes 35   Recumbant Bike   Level 3   RPM 50   Minutes 20   NuStep   Level 3   Watts 50   Minutes 20   Biostep-RELP   Level 6   Watts 40   Minutes 20      Nutrition:  Target Goals: Understanding of nutrition guidelines, daily intake of sodium '1500mg'$ , cholesterol '200mg'$ , calories 30% from fat and 7% or less from saturated fats, daily to have 5 or more servings of fruits and vegetables.  Biometrics:     Pre Biometrics - 07/02/15 1447    Pre Biometrics   Waist Circumference 43 inches  Hip Circumference 38 inches   Waist to Hip Ratio 1.13 %       Nutrition Therapy Plan and Nutrition Goals:     Nutrition Therapy & Goals - 07/02/15 1451    Nutrition Therapy   Drug/Food Interactions Statins/Certain Fruits   Personal Nutrition Goals   Personal Goal #1 Cont. to eat heart healthy and eat to control his diabetes.    Comments Arik reports that his wife already has him on a heart healthy and a diabetic eating plan. Dorr said he did not feel he needed to meet individually with the Cardiac Rehab Registered dietician.    Intervention Plan   Intervention Using nutrition plan and personal goals to gain a healthy nutrition lifestyle. Add exercise as prescribed.      Nutrition Discharge: Rate Your Plate Scores:     Rate Your Plate - 81/82/99 3716    Rate Your Plate Scores   Post Score 74   Post Score % 82.22 %      Nutrition Goals Re-Evaluation:     Nutrition Goals Re-Evaluation      08/29/15 1605           Personal Goal #1 Re-Evaluation   Goal Progress Seen Yes       Comments Donte said that his wife had shoulder surgery 2 weeks ago and people are bringing apple pies and coconut pies to his house so it is hard to eat correctly at times but he says he  usually eats healthy to control his blood sugars in the 100 range.           Psychosocial: Target Goals: Acknowledge presence or absence of depression, maximize coping skills, provide positive support system. Participant is able to verbalize types and ability to use techniques and skills needed for reducing stress and depression.  Initial Review & Psychosocial Screening:     Initial Psych Review & Screening - 07/02/15 [place]? Yes   Comments Quy reports that his wife feeds him good food based on a diabetic diet and now a heart diet.    Barriers   Psychosocial barriers to participate in program There are no identifiable barriers or psychosocial needs.   Screening Interventions   Interventions Encouraged to exercise      Quality of Life Scores:     Quality of Life - 09/21/15 1016    Quality of Life Scores   Health/Function Post 24.87 %   Health/Function % Change 21.9 %   Socioeconomic Post 25.36 %   Socioeconomic % Change -4 %   Psych/Spiritual Post 24.57 %   Psych/Spiritual % Change -9 %   Family Post 26.4 %   Family % Change -8.3 %  Rilan's wife had shoulder surgery recently.    GLOBAL Post 25.13 %   GLOBAL % Change 3.7 %      PHQ-9:     Recent Review Flowsheet Data    Depression screen [facility] 2/9 09/21/2015 07/02/2015 06/21/2015   Decreased Interest 0 2 0   Down, Depressed, Hopeless 0 1 0   PHQ - 2 Score 0 3 0   Altered sleeping 0 0 -   Tired, decreased energy 0 2 -   Change in appetite 0 0 -   Feeling bad or failure about yourself  0 0 -   Trouble concentrating 0 0 -   Moving slowly or fidgety/restless 0 1 -   Suicidal thoughts 0 0 -  PHQ-9 Score 0 6 -   Difficult doing work/chores - Somewhat difficult -      Psychosocial Evaluation and Intervention:     Psychosocial Evaluation - 08/01/15 1658    Psychosocial Evaluation & Interventions   Comments Met with Mr. Sires today for psychosocial evaluation reporting  triple bypass on 10/12.  He has a strong support system with a spouse of 15 years and (2) adult daughters who live close by.  Mr. Jerilynn Mages has diabetes and sleep apnea - but is sleeping fine and his appetite has returned recently.  He denies a history of depression or anxiety or curent symptoms.  He is typically in a positive mood and reports minimal stress in his life currently.  Mr. Jerilynn Mages has goals to continue exercising without being tired, and to lose some more weight while in this program.  His spouse has reported positive benefits already seen since he started this program stating he has "more stamina now  than he has had in the past few years."  Counselor commended Mr. Jerilynn Mages for his progress and his commitment to consistent exercise.       Psychosocial Re-Evaluation:     Psychosocial Re-Evaluation      08/29/15 1608           Psychosocial Re-Evaluation   Interventions Encouraged to attend Cardiac Rehabilitation for the exercise       Comments Dahl has been having to take care of his wife who had shoulder surgery. People are brining food in but no always the healthiest.           Vocational Rehabilitation: Provide vocational rehab assistance to qualifying candidates.   Vocational Rehab Evaluation & Intervention:     Vocational Rehab - 07/02/15 1350    Initial Vocational Rehab Evaluation & Intervention   Assessment shows need for Vocational Rehabilitation No      Education: Education Goals: Education classes will be provided on a weekly basis, covering required topics. Participant will state understanding/return demonstration of topics presented.  Learning Barriers/Preferences:   Education Topics: General Nutrition Guidelines/Fats and Fiber: -Group instruction provided by verbal, written material, models and posters to present the general guidelines for heart healthy nutrition. Gives an explanation and review of dietary fats and fiber.          Cardiac Rehab from 09/05/2015 in [facility]  Cardiac and Pulmonary Rehab   Date  07/16/15   Educator  PI   Instruction Review Code  2- meets goals/outcomes      Controlling Sodium/Reading Food Labels: -Group verbal and written material supporting the discussion of sodium use in heart healthy nutrition. Review and explanation with models, verbal and written materials for utilization of the food label.      Cardiac Rehab from 09/05/2015 in [facility] Cardiac and Pulmonary Rehab   Date  07/23/15   Educator  PI   Instruction Review Code  2- meets goals/outcomes      Exercise Physiology & Risk Factors: - Group verbal and written instruction with models to review the exercise physiology of the cardiovascular system and associated critical values. Details cardiovascular disease risk factors and the goals associated with each risk factor.   Aerobic Exercise & Resistance Training: - Gives group verbal and written discussion on the health impact of inactivity. On the components of aerobic and resistive training programs and the benefits of this training and how to safely progress through these programs.      Cardiac Rehab from 09/05/2015 in [facility] Cardiac and Pulmonary Rehab  Date  08/27/15   Educator  RM   Instruction Review Code  2- meets goals/outcomes      Flexibility, Balance, General Exercise Guidelines: - Provides group verbal and written instruction on the benefits of flexibility and balance training programs. Provides general exercise guidelines with specific guidelines to those with heart or lung disease. Demonstration and skill practice provided.      Cardiac Rehab from 09/05/2015 in [facility] Cardiac and Pulmonary Rehab   Date  08/27/15   Educator  RM   Instruction Review Code  2- meets goals/outcomes      Stress Management: - Provides group verbal and written instruction about the health risks of elevated stress, cause of high stress, and healthy ways to reduce stress.   Depression: - Provides group verbal and written instruction  on the correlation between heart/lung disease and depressed mood, treatment options, and the stigmas associated with seeking treatment.      Cardiac Rehab from 09/05/2015 in [facility] Cardiac and Pulmonary Rehab   Date  07/25/15   Educator  Juliann Pulse C   Instruction Review Code  2- meets goals/outcomes      Anatomy & Physiology of the Heart: - Group verbal and written instruction and models provide basic cardiac anatomy and physiology, with the coronary electrical and arterial systems. Review of: AMI, Angina, Valve disease, Heart Failure, Cardiac Arrhythmia, Pacemakers, and the ICD.      Cardiac Rehab from 09/05/2015 in [facility] Cardiac and Pulmonary Rehab   Date  09/03/15   Educator  SB   Instruction Review Code  2- meets goals/outcomes      Cardiac Procedures: - Group verbal and written instruction and models to describe the testing methods done to diagnose heart disease. Reviews the outcomes of the test results. Describes the treatment choices: Medical Management, Angioplasty, or Coronary Bypass Surgery.   Cardiac Medications: - Group verbal and written instruction to review commonly prescribed medications for heart disease. Reviews the medication, class of the drug, and side effects. Includes the steps to properly store meds and maintain the prescription regimen.   Go Sex-Intimacy & Heart Disease, Get SMART - Goal Setting: - Group verbal and written instruction through game format to discuss heart disease and the return to sexual intimacy. Provides group verbal and written material to discuss and apply goal setting through the application of the S.M.A.R.T. Method.   Other Matters of the Heart: - Provides group verbal, written materials and models to describe Heart Failure, Angina, Valve Disease, and Diabetes in the realm of heart disease. Includes description of the disease process and treatment options available to the cardiac patient.      Cardiac Rehab from 09/05/2015 in [facility] Cardiac and  Pulmonary Rehab   Date  07/11/15   Educator  DW   Instruction Review Code  2- meets goals/outcomes      Exercise & Equipment Safety: - Individual verbal instruction and demonstration of equipment use and safety with use of the equipment.      Cardiac Rehab from 09/05/2015 in [facility] Cardiac and Pulmonary Rehab   Date  07/02/15   Educator  C. EnterkinRN   Instruction Review Code  1- partially meets, needs review/practice      Infection Prevention: - Provides verbal and written material to individual with discussion of infection control including proper hand washing and proper equipment cleaning during exercise session.      Cardiac Rehab from 09/05/2015 in [facility] Cardiac and Pulmonary Rehab   Date  07/02/15   Educator  Loletha Grayer EnterkinRN   Instruction Review Code  1- partially meets, needs review/practice      Falls Prevention: - Provides verbal and written material to individual with discussion of falls prevention and safety.      Cardiac Rehab from 09/05/2015 in [facility] Cardiac [facility] Pulmonary Rehab   Date  07/02/15   Educator  C. Halstad   Instruction Review Code  2- meets goals/outcomes      Diabetes: - Individual verbal and written instruction to review signs/symptoms of diabetes, desired ranges of glucose level fasting, after meals and with exercise. Advice that pre and post exercise glucose checks will be done for 3 sessions at entry of program.      Cardiac Rehab from 09/05/2015 in [facility] Cardiac and Pulmonary Rehab   Date  07/02/15   Educator  C. Kennard   Instruction Review Code  2- meets goals/outcomes       Knowledge Questionnaire Score:     Knowledge Questionnaire Score - 09/21/15 1020    Knowledge Questionnaire Score   Post Score 25      Personal Goals and Risk Factors at Admission:     Personal Goals and Risk Factors at Admission - 07/02/15 1447    Personal Goals and Risk Factors on Admission   Increase Aerobic Exercise and Physical Activity Yes    Intervention While in program, learn and follow the exercise prescription taught. Start at a low level workload and increase workload after able to maintain previous level for 30 minutes. Increase time before increasing intensity.  Goal time to achieve goal by 36 sessions.    Diabetes Yes   Goal Blood glucose control identified by blood glucose values, HgbA1C. Participant verbalizes understanding of the signs/symptoms of hyper/hypo glycemia, proper foot care and importance of medication and nutrition plan for blood glucose control.   Intervention Provide nutrition & aerobic exercise along with prescribed medications to achieve blood glucose in normal ranges: Fasting 65-99 mg/dL   Hypertension Yes   Goal Participant will see blood pressure controlled within the values of 140/80m/Hg or within value directed by their physician.   Intervention Provide nutrition & aerobic exercise along with prescribed medications to achieve BP 140/90 or less.   Lipids Yes   Goal Cholesterol controlled with medications as prescribed, with individualized exercise RX and with personalized nutrition plan. Value goals: LDL < '70mg'$ , HDL > '40mg'$ . Participant states understanding of desired cholesterol values and following prescriptions.   Intervention Provide nutrition & aerobic exercise along with prescribed medications to achieve LDL '70mg'$ , HDL >'40mg'$ .      Personal Goals and Risk Factors Review:      Goals and Risk Factor Review      07/09/15 1737 07/25/15 1737 08/27/15 1151 08/29/15 1606 09/12/15 1658   Increase Aerobic Exercise and Physical Activity   Goals Progress/Improvement seen  No  No     Comments Today was first exsecise session.  Spoke with GRehaanabout his overall goals and short term goal ssetting.  He does want to return to his golf game. He states that will be awhile before that happens. Also he wants to return to exercising 30 minutes 3 times a day and doing weights. Short term goal is to attend class 3  days a week and follow the exerciseprescription that he is given.   GTuckhas been absent since 08/09/15. His exercise goals and rx. will need to be re-evaluated based on his current PA level.      Diabetes   Goal  Blood glucose control identified by blood glucose values, HgbA1C. Participant verbalizes understanding of the signs/symptoms of hyper/hypo glycemia, proper foot care and importance of medication and nutrition plan for blood glucose control.   Progress seen towards goals  Yes  Yes Yes   Comments  Alrick  states he is changing his eating habits more fruit and vegs, less fried foods, decreased his starches, added more beans/legumes.  Maximillion said the past 2 weeks have been tough since his wife had shoulder surgery and she usually cooks healthy for him. He asked about our cafeteria and I said that they have green dots for healthy food.  Recent HgbA1C 7.2%    Jozef continues to work on eating habits and exercise. Limited some currently because his wife had surgery and is not cooking the "healthy food".   Hypertension   Goal     Participant will see blood pressure controlled within the values of 140/34m/Hg or within value directed by their physician.   Progress seen toward goals  Yes  Yes    Comments  GSeve states he is changing his eating habits more fruit and vegs, less fried foods, decreased his starches, added more beans/legumes. He is keeping an eye on his sodium intake.  Blood pressure has been stable -see telemetry sheets.  BP remains in good range when in class. Geroge reported it was elevated in MD office today.  He has a med change and a new med prescribed.   Abnormal Lipids   Progress seen towards goals  Yes  Yes    Comments  GAmericus states he is changing his eating habits more fruit and vegs, less fried foods, decreased his starches, added more beans/legumes. He is progressing with his exercise program  NDeadrick Stiddhas been trying to eat more fruits andvegetables even when they  go out to eat.       09/12/15 1701           Abnormal Lipids   Comments COntinues to work on exercise and eating the right foods amd amounts          Personal Goals Discharge (Final Personal Goals and Risk Factors Review):      Goals and Risk Factor Review - 09/12/15 1701    Abnormal Lipids   Comments COntinues to work on exercise and eating the right foods amd amounts      ITP Comments:     ITP Comments      07/17/15 1318 08/12/15 1408 09/07/15 1227 09/19/15 1809 09/21/15 1021   ITP Comments 30 day review preparation  Continue with ITP   New to program only a few visits completed Ready for 30 day review.  Continue with ITP Ready fro 30 day review, Continue with ITP.  GAmedeoreported his MD has changed some of his medications.  He is no longer on Diltiazem.  Metoprolol has been increased to '100mg'$  twice daily.  Losartan-HCTZ 50-12.5 mg is one tablet once a day.  Plavix 75 mg once a day.  These changes have already been made to his medication list.  See Medication List for Current Medications.   Canuto's wife had shoulder surgery so he has been having to help take care of her also.       Comments:

## 2015-10-02 NOTE — Progress Notes (Signed)
Cardiac Individual Treatment Plan  Patient Details  Name: Bradley [name] MRN: [id number] Date of Birth: 1946-07-06 Referring Provider:  Belva Crome, MD  Initial Encounter Date:    Visit Diagnosis: S/P CABG x 3  Patient's Home Medications on Admission:  Current outpatient prescriptions:  .  Ascorbic Acid (VITAMIN C) 100 MG tablet, Take 100 mg by mouth daily., Disp: , Rfl:  .  aspirin EC 81 MG EC tablet, Take 1 tablet (81 mg total) by mouth daily., Disp: , Rfl:  .  atorvastatin (LIPITOR) 40 MG tablet, Take 40 mg by mouth daily.  , Disp: , Rfl:  .  Calcium Carbonate-Vitamin D (CALCIUM-D) 600-400 MG-UNIT TABS, Take 1 tablet by mouth daily. , Disp: , Rfl:  .  Cholecalciferol (D-3-5) 5000 UNITS capsule, Take 5,000 Units by mouth daily., Disp: , Rfl:  .  clopidogrel (PLAVIX) 75 MG tablet, Take 1 tablet (75 mg total) by mouth daily., Disp: 1 tablet, Rfl: 0 .  dexlansoprazole (DEXILANT) 60 MG capsule, Take 60 mg by mouth daily., Disp: , Rfl:  .  glimepiride (AMARYL) 2 MG tablet, Take 2 mg by mouth daily before breakfast.  , Disp: , Rfl:  .  Hydrocortisone Acetate (PROCTOSOL RE), Place rectally., Disp: , Rfl:  .  IRON, FERROUS GLUCONATE, PO, Take 40 mg by mouth 2 (two) times daily., Disp: , Rfl:  .  Liraglutide (VICTOZA [place]), Inject 1.8 mg into the skin daily. , Disp: , Rfl:  .  losartan-hydrochlorothiazide (HYZAAR) 50-12.5 MG tablet, Take 1 tablet by mouth daily., Disp: 90 tablet, Rfl: 3 .  metFORMIN (GLUCOPHAGE-XR) 500 MG 24 hr tablet, Take 2 tablets (1,000 mg total) by mouth 2 (two) times daily., Disp: , Rfl:  .  metoprolol (LOPRESSOR) 100 MG tablet, Take 1 tablet (100 mg total) by mouth 2 (two) times daily., Disp: 180 tablet, Rfl: 3 .  Multiple Vitamin (MULTIVITAMIN) capsule, Take 1 capsule by mouth daily.  , Disp: , Rfl:  .  traMADol (ULTRAM) 50 MG tablet, Take 1 tablet (50 mg total) by mouth every 6 (six) hours as needed for moderate pain., Disp: 30 tablet, Rfl: 0  Past Medical  History: Past Medical History  Diagnosis Date  . Hyperlipidemia   . Hypertension   . Diabetes mellitus age 31  . Peripheral vascular disease ([place])   . Arthritis   . Reflux esophagitis   . Stroke [facility] 1987    Right brain stroke  . Neuropathy ([facility]) 2013  . Osteoporosis 2013  . Sleep apnea     uses CPAP  . GERD (gastroesophageal reflux disease)   . H/O hiatal hernia   . Irritable bowel syndrome (IBS)   . Myocardial infarction [facility])     silent inferior MI; patient denies MI history (03/17/13)   . Anemia   . Carotid artery occlusion     a. Carotid US 10/16: RICA 60-79%, L CEA patent with 1-39% stenosis  . Coronary artery disease     a. Myoview 9/16:  EF 52%, inferior fixed defect consistent with diaphragmatic attenuation, intermediate risk secondary to poor exercise tolerance and symptoms during stress;  b. LHC 05/17/2015 90% mid LAD, 80% ost D2, 75% mid RCA, 35% prox RCA. >> S/p CABG  . Diverticulitis   . Wears glasses   . PAF (paroxysmal atrial fibrillation) ([place])     post CABG; Amiodarone stopped 2/2 wheezing;   . History of echocardiogram     a. Echo 9/16: GLS -15.2%, EF 03-50%, grade 1 diastolic dysfunction, normal  wall motion, aortic sclerosis, dilated aortic root 39 mm, atrial septal lipomatous hypertrophy    Tobacco Use: History  Smoking status  . Former Smoker -- 2.00 packs/day for 40 years  . Types: Cigarettes  . Quit date: 03/26/2009  Smokeless tobacco  . Never Used    Labs: Recent Review Flowsheet Data    Labs for ITP Cardiac and Pulmonary Rehab Latest Ref Rng 05/24/2015 05/24/2015 05/24/2015 05/31/2015 06/01/2015   Cholestrol 0 - 200 mg/dL - - - 83 82   LDLCALC 0 - 99 mg/dL - - - 36 26   HDL >40 mg/dL - - - 26(L) 24(L)   Trlycerides <150 mg/dL - - - 104 158(H)   PHART 7.350 - 7.450 - 7.297(L) - - -   PCO2ART 35.0 - 45.0 mmHg - 43.4 - - -   HCO3 20.0 - 24.0 mEq/L - 21.2 - - -   TCO2 0 - 100 mmol/L _0 - -   ACIDBASEDEF 0.0 - 2.0 mmol/L - 5.0(H) - - -    O2SAT - - 91.0 - - -       Exercise Target Goals:    Exercise Program Goal: Individual exercise prescription set with THRR, safety & activity barriers. Participant demonstrates ability to understand and report RPE using BORG scale, to self-measure pulse accurately, and to acknowledge the importance of the exercise prescription.  Exercise Prescription Goal: Starting with aerobic activity 30 plus minutes a day, 3 days per week for initial exercise prescription. Provide home exercise prescription and guidelines that participant acknowledges understanding prior to discharge.  Activity Barriers & Risk Stratification:     Activity Barriers & Cardiac Risk Stratification - 07/02/15 1348    Activity Barriers & Cardiac Risk Stratification   Activity Barriers Back Problems   Cardiac Risk Stratification High      6 Minute Walk:     6 Minute Walk      07/02/15 1444       6 Minute Walk   Phase Initial     Distance 1175 feet     Walk Time 6 minutes     RPE 12     Perceived Dyspnea  0     Symptoms No     Resting HR 98 bpm     Resting BP 138/62 mmHg     Max Ex. HR 102 bpm     Max Ex. BP 140/78 mmHg        Initial Exercise Prescription:     Initial Exercise Prescription - 07/02/15 1500    Date of Initial Exercise Prescription   Date 07/02/15   Treadmill   MPH 2.5   Grade 0   Minutes 15   Bike   Level 1   Minutes 15   Recumbant Bike   Level 2   Watts 30   Minutes 15   NuStep   Level 3   Watts 40   Minutes 15   Arm Ergometer   Level 1   Watts 10   Minutes 15   Arm/Foot Ergometer   Level 1   Watts 12   Minutes 15   Cybex   Level 2   RPM 40   Minutes 15   Recumbant Elliptical   Level 1   Watts 20   Minutes 15   Elliptical   Level 1   Speed 2.5   Minutes 5   REL-XR   Level 3   Watts 30   Minutes 15   T5 Nustep  Level 2   Watts 20   Minutes 15   Biostep-RELP   Level 3   Watts 30   Minutes 15   Prescription Details   Frequency (times per  week) 3   Duration Progress to 30 minutes of continuous aerobic without signs/symptoms of physical distress   Intensity   THRR REST +  30   Ratings of Perceived Exertion 11-15   Perceived Dyspnea 2-4   Progression Continue progressive overload as per policy without signs/symptoms or physical distress.   Resistance Training   Training Prescription Yes   Weight 2   Reps 10-15      Exercise Prescription Changes:     Exercise Prescription Changes      07/09/15 1439 07/18/15 1600 08/02/15 1249 08/27/15 1100 09/05/15 1700   Exercise Review   Progression Yes Yes Yes No  Absent since 08/09/15 No  Absent since 08/09/15   Response to Exercise   Blood Pressure (Admit) 112/72 mmHg  130/68 mmHg 128/62 mmHg    Blood Pressure (Exercise) 158/62 mmHg  148/70 mmHg 138/76 mmHg    Blood Pressure (Exit) 128/70 mmHg  122/60 mmHg 124/60 mmHg    Heart Rate (Admit) 82 bpm  61 bpm 65 bpm    Heart Rate (Exercise) 96 bpm  97 bpm 93 bpm    Heart Rate (Exit) 81 bpm  64 bpm 64 bpm    Rating of Perceived Exertion (Exercise) _0 Symptoms Back pain at times  No new pain other than chronic back pain No new pain other than chronic back pain    Comments First day of exercise! Patient was oriented to the gym and the equipment functions and settings. Procedures and policies of the gym were outlined and explained. The patient's individual exercise prescription and treatment plan were reviewed with them. All starting workloads were established based on the results of the functional testing  done at the initial intake visit. The plan for exercise progression was also introduced and progression will be customized based on the patient's performance and goals.   Darious is starting to plateau on some of his exercise progressions. Progression will be slower now that he is working close to his full capacity on each machine. He can continuously exercise for the entire class. We will now use interval training to progress him  with his exercise intensity.  Lewi has been absent since 08/09/15. His workloads may need to be re-evaluated based on his current PA level.     Frequency     Add 2 additional days to program exercise sessions.  Dmitriy plans to walk at home or go to gym to walk on TM.     Duration Progress to 30 minutes of continuous aerobic without signs/symptoms of physical distress Progress to 30 minutes of continuous aerobic without signs/symptoms of physical distress Progress to 30 minutes of continuous aerobic without signs/symptoms of physical distress Progress to 30 minutes of continuous aerobic without signs/symptoms of physical distress Progress to 30 minutes of continuous aerobic without signs/symptoms of physical distress   Intensity Rest + 30 Rest + 30 Rest + 30 Rest + 30 Rest + 30   Progression Continue progressive overload as per policy without signs/symptoms or physical distress. Continue progressive overload as per policy without signs/symptoms or physical distress. Continue progressive overload as per policy without signs/symptoms or physical distress. Continue progressive overload as per policy without signs/symptoms or physical distress. Continue progressive overload as per policy without signs/symptoms or physical  distress.   Resistance Training   Training Prescription Yes Yes Yes Yes Yes   Weight '2 3 3 3 3   '$ Reps 10-15 10-15 10-15 10-15 10-15   Interval Training   Interval Training No No Yes Yes Yes   Equipment   --  BioStep --  BioStep --  BioStep   Comments   Level 4 for 3 minutes, Level 6 for 1 minute. Repeat for 10-15 minutes Level 4 for 3 minutes, Level 6 for 1 minute. Repeat for 10-15 minutes Level 4 for 3 minutes, Level 6 for 1 minute. Repeat for 10-15 minutes   Treadmill   MPH   2.8 2.8 2.8   Grade   0 0 0   Minutes   '20 20 20   '$ Recumbant Bike   Level '3 3 3 3 3   '$ RPM 50 50 50 50 50   Minutes '20 20 20 20 20   '$ NuStep   Level '3 3 3 3 3   '$ Watts 50 50 50 50 50   Minutes '20 20 20  20 20   '$ Biostep-RELP   Level  '6 6 6 6   '$ Watts  40 40 40 40   Minutes  '20 20 20 20     '$ 09/12/15 1600 09/25/15 0700         Exercise Review   Progression  Yes      Response to Exercise   Blood Pressure (Admit)  132/80 mmHg      Blood Pressure (Exercise)  144/80 mmHg      Blood Pressure (Exit)  114/65 mmHg      Heart Rate (Admit)  81 bpm      Heart Rate (Exercise)  104 bpm      Heart Rate (Exit)  85 bpm      Rating of Perceived Exertion (Exercise)  13      Symptoms None None      Comments Humberto has maintained much regular attendance and has resumed previous workloads. He is also being conscious of exercising outside of class and reported that he has exercised on the "off" days of class each day this week. He is very proud of that. Hendrik wants to focus the rest of his time in rehab on his walking. We have progressed him on the TM up to his max walking speed and provided further stimulus through an incline. We has done well with the ex. increases and has completed all progression suggestions without incident. He maintains exercise at home on days he does not come to class. We will follow up with him in the coming weeks on what we can do to provide him challenges and help him meet his exercise goals.       Frequency Add 2 additional days to program exercise sessions.  Ra is walking at home on days he doesn't come to rehab Add 2 additional days to program exercise sessions.  Sumedh is walking at home on days he doesn't come to rehab      Duration Progress to 30 minutes of continuous aerobic without signs/symptoms of physical distress Progress to 30 minutes of continuous aerobic without signs/symptoms of physical distress      Intensity Rest + 30 Rest + 30      Progression Continue progressive overload as per policy without signs/symptoms or physical distress. Continue progressive overload as per policy without signs/symptoms or physical distress.      Resistance Training   Training  Prescription Yes Yes  Weight 3 4      Reps 10-15 10-15      Interval Training   Interval Training Yes Yes      Equipment --  BioStep --  BioStep      Comments Level 4 for 3 minutes, Level 6 for 1 minute. Repeat for 10-15 minutes Level 4 for 3 minutes, Level 6 for 1 minute. Repeat for 10-15 minutes      Treadmill   MPH 2.8 3      Grade 0 2      Minutes 20 35      Recumbant Bike   Level 3 3      RPM 50 50      Minutes 20 20      NuStep   Level 3 3      Watts 50 50      Minutes 20 20      Biostep-RELP   Level 6 6      Watts 40 40      Minutes 20 20         Discharge Exercise Prescription (Final Exercise Prescription Changes):     Exercise Prescription Changes - 09/25/15 0700    Exercise Review   Progression Yes   Response to Exercise   Blood Pressure (Admit) 132/80 mmHg   Blood Pressure (Exercise) 144/80 mmHg   Blood Pressure (Exit) 114/65 mmHg   Heart Rate (Admit) 81 bpm   Heart Rate (Exercise) 104 bpm   Heart Rate (Exit) 85 bpm   Rating of Perceived Exertion (Exercise) 13   Symptoms None   Comments Sinclair wants to focus the rest of his time in rehab on his walking. We have progressed him on the TM up to his max walking speed and provided further stimulus through an incline. We has done well with the ex. increases and has completed all progression suggestions without incident. He maintains exercise at home on days he does not come to class. We will follow up with him in the coming weeks on what we can do to provide him challenges and help him meet his exercise goals.    Frequency Add 2 additional days to program exercise sessions.  Avett is walking at home on days he doesn't come to rehab   Duration Progress to 30 minutes of continuous aerobic without signs/symptoms of physical distress   Intensity Rest + 30   Progression Continue progressive overload as per policy without signs/symptoms or physical distress.   Resistance Training   Training Prescription Yes    Weight 4   Reps 10-15   Interval Training   Interval Training Yes   Equipment --  BioStep   Comments Level 4 for 3 minutes, Level 6 for 1 minute. Repeat for 10-15 minutes   Treadmill   MPH 3   Grade 2   Minutes 35   Recumbant Bike   Level 3   RPM 50   Minutes 20   NuStep   Level 3   Watts 50   Minutes 20   Biostep-RELP   Level 6   Watts 40   Minutes 20      Nutrition:  Target Goals: Understanding of nutrition guidelines, daily intake of sodium '1500mg'$ , cholesterol '200mg'$ , calories 30% from fat and 7% or less from saturated fats, daily to have 5 or more servings of fruits and vegetables.  Biometrics:     Pre Biometrics - 07/02/15 1447    Pre Biometrics   Waist Circumference 43 inches  Hip Circumference 38 inches   Waist to Hip Ratio 1.13 %       Nutrition Therapy Plan and Nutrition Goals:     Nutrition Therapy & Goals - 07/02/15 1451    Nutrition Therapy   Drug/Food Interactions Statins/Certain Fruits   Personal Nutrition Goals   Personal Goal #1 Cont. to eat heart healthy and eat to control his diabetes.    Comments Brant reports that his wife already has him on a heart healthy and a diabetic eating plan. Ryaan said he did not feel he needed to meet individually with the Cardiac Rehab Registered dietician.    Intervention Plan   Intervention Using nutrition plan and personal goals to gain a healthy nutrition lifestyle. Add exercise as prescribed.      Nutrition Discharge: Rate Your Plate Scores:     Rate Your Plate - 41/28/78 6767    Rate Your Plate Scores   Post Score 74   Post Score % 82.22 %      Nutrition Goals Re-Evaluation:     Nutrition Goals Re-Evaluation      08/29/15 1605           Personal Goal #1 Re-Evaluation   Goal Progress Seen Yes       Comments Onie said that his wife had shoulder surgery 2 weeks ago and people are bringing apple pies and coconut pies to his house so it is hard to eat correctly at times but he says he  usually eats healthy to control his blood sugars in the 100 range.           Psychosocial: Target Goals: Acknowledge presence or absence of depression, maximize coping skills, provide positive support system. Participant is able to verbalize types and ability to use techniques and skills needed for reducing stress and depression.  Initial Review & Psychosocial Screening:     Initial Psych Review & Screening - 07/02/15 [place]? Yes   Comments Miner reports that his wife feeds him good food based on a diabetic diet and now a heart diet.    Barriers   Psychosocial barriers to participate in program There are no identifiable barriers or psychosocial needs.   Screening Interventions   Interventions Encouraged to exercise      Quality of Life Scores:     Quality of Life - 09/21/15 1016    Quality of Life Scores   Health/Function Post 24.87 %   Health/Function % Change 21.9 %   Socioeconomic Post 25.36 %   Socioeconomic % Change -4 %   Psych/Spiritual Post 24.57 %   Psych/Spiritual % Change -9 %   Family Post 26.4 %   Family % Change -8.3 %  Tabitha's wife had shoulder surgery recently.    GLOBAL Post 25.13 %   GLOBAL % Change 3.7 %      PHQ-9:     Recent Review Flowsheet Data    Depression screen [facility] 2/9 09/21/2015 07/02/2015 06/21/2015   Decreased Interest 0 2 0   Down, Depressed, Hopeless 0 1 0   PHQ - 2 Score 0 3 0   Altered sleeping 0 0 -   Tired, decreased energy 0 2 -   Change in appetite 0 0 -   Feeling bad or failure about yourself  0 0 -   Trouble concentrating 0 0 -   Moving slowly or fidgety/restless 0 1 -   Suicidal thoughts 0 0 -  PHQ-9 Score 0 6 -   Difficult doing work/chores - Somewhat difficult -      Psychosocial Evaluation and Intervention:     Psychosocial Evaluation - 08/01/15 1658    Psychosocial Evaluation & Interventions   Comments Met with Mr. Odonell today for psychosocial evaluation reporting  triple bypass on 10/12.  He has a strong support system with a spouse of 15 years and (2) adult daughters who live close by.  Mr. Judie Petit has diabetes and sleep apnea - but is sleeping fine and his appetite has returned recently.  He denies a history of depression or anxiety or curent symptoms.  He is typically in a positive mood and reports minimal stress in his life currently.  Mr. Judie Petit has goals to continue exercising without being tired, and to lose some more weight while in this program.  His spouse has reported positive benefits already seen since he started this program stating he has "more stamina now  than he has had in the past few years."  Counselor commended Mr. Judie Petit for his progress and his commitment to consistent exercise.       Psychosocial Re-Evaluation:     Psychosocial Re-Evaluation      08/29/15 1608           Psychosocial Re-Evaluation   Interventions Encouraged to attend Cardiac Rehabilitation for the exercise       Comments Adyn has been having to take care of his wife who had shoulder surgery. People are brining food in but no always the healthiest.           Vocational Rehabilitation: Provide vocational rehab assistance to qualifying candidates.   Vocational Rehab Evaluation & Intervention:     Vocational Rehab - 07/02/15 1350    Initial Vocational Rehab Evaluation & Intervention   Assessment shows need for Vocational Rehabilitation No      Education: Education Goals: Education classes will be provided on a weekly basis, covering required topics. Participant will state understanding/return demonstration of topics presented.  Learning Barriers/Preferences:   Education Topics: General Nutrition Guidelines/Fats and Fiber: -Group instruction provided by verbal, written material, models and posters to present the general guidelines for heart healthy nutrition. Gives an explanation and review of dietary fats and fiber.          Cardiac Rehab from 09/05/2015 in [facility]  Cardiac and Pulmonary Rehab   Date  07/16/15   Educator  PI   Instruction Review Code  2- meets goals/outcomes      Controlling Sodium/Reading Food Labels: -Group verbal and written material supporting the discussion of sodium use in heart healthy nutrition. Review and explanation with models, verbal and written materials for utilization of the food label.      Cardiac Rehab from 09/05/2015 in [facility] Cardiac and Pulmonary Rehab   Date  07/23/15   Educator  PI   Instruction Review Code  2- meets goals/outcomes      Exercise Physiology & Risk Factors: - Group verbal and written instruction with models to review the exercise physiology of the cardiovascular system and associated critical values. Details cardiovascular disease risk factors and the goals associated with each risk factor.   Aerobic Exercise & Resistance Training: - Gives group verbal and written discussion on the health impact of inactivity. On the components of aerobic and resistive training programs and the benefits of this training and how to safely progress through these programs.      Cardiac Rehab from 09/05/2015 in [facility] Cardiac and Pulmonary Rehab  Date  08/27/15   Educator  RM   Instruction Review Code  2- meets goals/outcomes      Flexibility, Balance, General Exercise Guidelines: - Provides group verbal and written instruction on the benefits of flexibility and balance training programs. Provides general exercise guidelines with specific guidelines to those with heart or lung disease. Demonstration and skill practice provided.      Cardiac Rehab from 09/05/2015 in [facility] Cardiac and Pulmonary Rehab   Date  08/27/15   Educator  RM   Instruction Review Code  2- meets goals/outcomes      Stress Management: - Provides group verbal and written instruction about the health risks of elevated stress, cause of high stress, and healthy ways to reduce stress.   Depression: - Provides group verbal and written instruction  on the correlation between heart/lung disease and depressed mood, treatment options, and the stigmas associated with seeking treatment.      Cardiac Rehab from 09/05/2015 in [facility] Cardiac and Pulmonary Rehab   Date  07/25/15   Educator  Juliann Pulse C   Instruction Review Code  2- meets goals/outcomes      Anatomy & Physiology of the Heart: - Group verbal and written instruction and models provide basic cardiac anatomy and physiology, with the coronary electrical and arterial systems. Review of: AMI, Angina, Valve disease, Heart Failure, Cardiac Arrhythmia, Pacemakers, and the ICD.      Cardiac Rehab from 09/05/2015 in [facility] Cardiac and Pulmonary Rehab   Date  09/03/15   Educator  SB   Instruction Review Code  2- meets goals/outcomes      Cardiac Procedures: - Group verbal and written instruction and models to describe the testing methods done to diagnose heart disease. Reviews the outcomes of the test results. Describes the treatment choices: Medical Management, Angioplasty, or Coronary Bypass Surgery.   Cardiac Medications: - Group verbal and written instruction to review commonly prescribed medications for heart disease. Reviews the medication, class of the drug, and side effects. Includes the steps to properly store meds and maintain the prescription regimen.   Go Sex-Intimacy & Heart Disease, Get SMART - Goal Setting: - Group verbal and written instruction through game format to discuss heart disease and the return to sexual intimacy. Provides group verbal and written material to discuss and apply goal setting through the application of the S.M.A.R.T. Method.   Other Matters of the Heart: - Provides group verbal, written materials and models to describe Heart Failure, Angina, Valve Disease, and Diabetes in the realm of heart disease. Includes description of the disease process and treatment options available to the cardiac patient.      Cardiac Rehab from 09/05/2015 in [facility] Cardiac and  Pulmonary Rehab   Date  07/11/15   Educator  DW   Instruction Review Code  2- meets goals/outcomes      Exercise & Equipment Safety: - Individual verbal instruction and demonstration of equipment use and safety with use of the equipment.      Cardiac Rehab from 09/05/2015 in [facility] Cardiac and Pulmonary Rehab   Date  07/02/15   Educator  C. EnterkinRN   Instruction Review Code  1- partially meets, needs review/practice      Infection Prevention: - Provides verbal and written material to individual with discussion of infection control including proper hand washing and proper equipment cleaning during exercise session.      Cardiac Rehab from 09/05/2015 in [facility] Cardiac and Pulmonary Rehab   Date  07/02/15   Educator  Loletha Grayer EnterkinRN   Instruction Review Code  1- partially meets, needs review/practice      Falls Prevention: - Provides verbal and written material to individual with discussion of falls prevention and safety.      Cardiac Rehab from 09/05/2015 in [facility] Cardiac [facility]   Date  07/02/15   Educator  C. [place]   Instruction Review Code  2- meets goals/outcomes      Diabetes: - Individual verbal and written instruction to review signs/symptoms of diabetes, desired ranges of glucose level fasting, after meals and with exercise. Advice that pre and post exercise glucose checks will be done for 3 sessions at entry of program.      Cardiac Rehab from 09/05/2015 in [facility] Cardiac and Pulmonary Rehab   Date  07/02/15   Educator  C. [place]   Instruction Review Code  2- meets goals/outcomes       Knowledge Questionnaire Score:     Knowledge Questionnaire Score - 09/21/15 1020    Knowledge Questionnaire Score   Post Score 25      Personal Goals and Risk Factors at Admission:     Personal Goals and Risk Factors at Admission - 07/02/15 1447    Personal Goals and Risk Factors on Admission   Increase Aerobic Exercise and Physical Activity Yes    Intervention While in program, learn and follow the exercise prescription taught. Start at a low level workload and increase workload after able to maintain previous level for 30 minutes. Increase time before increasing intensity.  Goal time to achieve goal by 36 sessions.    Diabetes Yes   Goal Blood glucose control identified by blood glucose values, HgbA1C. Participant verbalizes understanding of the signs/symptoms of hyper/hypo glycemia, proper foot care and importance of medication and nutrition plan for blood glucose control.   Intervention Provide nutrition & aerobic exercise along with prescribed medications to achieve blood glucose in normal ranges: Fasting 65-99 mg/dL   Hypertension Yes   Goal Participant will see blood pressure controlled within the values of 140/28m/Hg or within value directed by their physician.   Intervention Provide nutrition & aerobic exercise along with prescribed medications to achieve BP 140/90 or less.   Lipids Yes   Goal Cholesterol controlled with medications as prescribed, with individualized exercise RX and with personalized nutrition plan. Value goals: LDL < '70mg'$ , HDL > '40mg'$ . Participant states understanding of desired cholesterol values and following prescriptions.   Intervention Provide nutrition & aerobic exercise along with prescribed medications to achieve LDL '70mg'$ , HDL >'40mg'$ .      Personal Goals and Risk Factors Review:      Goals and Risk Factor Review      07/09/15 1737 07/25/15 1737 08/27/15 1151 08/29/15 1606 09/12/15 1658   Increase Aerobic Exercise and Physical Activity   Goals Progress/Improvement seen  No  No     Comments Today was first exsecise session.  Spoke with GJadrianabout his overall goals and short term goal ssetting.  He does want to return to his golf game. He states that will be awhile before that happens. Also he wants to return to exercising 30 minutes 3 times a day and doing weights. Short term goal is to attend class 3  days a week and follow the exerciseprescription that he is given.   GJothamhas been absent since 08/09/15. His exercise goals and rx. will need to be re-evaluated based on his current PA level.      Diabetes   Goal  Blood glucose control identified by blood glucose values, HgbA1C. Participant verbalizes understanding of the signs/symptoms of hyper/hypo glycemia, proper foot care and importance of medication and nutrition plan for blood glucose control.   Progress seen towards goals  Yes  Yes Yes   Comments  Faizaan  states he is changing his eating habits more fruit and vegs, less fried foods, decreased his starches, added more beans/legumes.  Trevione said the past 2 weeks have been tough since his wife had shoulder surgery and she usually cooks healthy for him. He asked about our cafeteria and I said that they have green dots for healthy food.  Recent HgbA1C 7.2%    Laiden continues to work on eating habits and exercise. Limited some currently because his wife had surgery and is not cooking the "healthy food".   Hypertension   Goal     Participant will see blood pressure controlled within the values of 140/31m/Hg or within value directed by their physician.   Progress seen toward goals  Yes  Yes    Comments  GShine states he is changing his eating habits more fruit and vegs, less fried foods, decreased his starches, added more beans/legumes. He is keeping an eye on his sodium intake.  Blood pressure has been stable -see telemetry sheets.  BP remains in good range when in class. Geroge reported it was elevated in MD office today.  He has a med change and a new med prescribed.   Abnormal Lipids   Progress seen towards goals  Yes  Yes    Comments  GDevean states he is changing his eating habits more fruit and vegs, less fried foods, decreased his starches, added more beans/legumes. He is progressing with his exercise program  NDayquan Buyshas been trying to eat more fruits andvegetables even when they  go out to eat.       09/12/15 1701           Abnormal Lipids   Comments COntinues to work on exercise and eating the right foods amd amounts          Personal Goals Discharge (Final Personal Goals and Risk Factors Review):      Goals and Risk Factor Review - 09/12/15 1701    Abnormal Lipids   Comments COntinues to work on exercise and eating the right foods amd amounts      ITP Comments:     ITP Comments      07/17/15 1318 08/12/15 1408 09/07/15 1227 09/19/15 1809 09/21/15 1021   ITP Comments 30 day review preparation  Continue with ITP   New to program only a few visits completed Ready for 30 day review.  Continue with ITP Ready fro 30 day review, Continue with ITP.  GZeshanreported his MD has changed some of his medications.  He is no longer on Diltiazem.  Metoprolol has been increased to '100mg'$  twice daily.  Losartan-HCTZ 50-12.5 mg is one tablet once a day.  Plavix 75 mg once a day.  These changes have already been made to his medication list.  See Medication List for Current Medications.   Saahas's wife had shoulder surgery so he has been having to help take care of her also.      10/02/15 0714           ITP Comments 30 day review.  Continue with ITP          Comments:

## 2015-10-03 ENCOUNTER — Encounter: Payer: PPO | Admitting: *Deleted

## 2015-10-03 DIAGNOSIS — Z951 Presence of aortocoronary bypass graft: Secondary | ICD-10-CM

## 2015-10-03 NOTE — Progress Notes (Signed)
Daily Session Note  Patient Details  Name: MARION ROSENBERRY MRN: [id number] Date of Birth: [birth year] Referring Provider:  Belva Crome, MD  Encounter Date: 10/03/2015  Check In:     Session Check In - 10/03/15 1627    Check-In   Location [facility]-Cardiac & Pulmonary Rehab   Staff Present Gerlene Burdock, RN, Drusilla Kanner, MS, ACSM CEP, Exercise Physiologist;Diane Joya Gaskins, RN, BSN   Medication changes reported     No   Fall or balance concerns reported    No   Warm-up and Cool-down Performed on first and last piece of equipment   Resistance Training Performed Yes   VAD Patient? No   Pain Assessment   Currently in Pain? No/denies   Pain Score 0-No pain   Multiple Pain Sites No         Goals Met:  Independence with exercise equipment Exercise tolerated well No report of cardiac concerns or symptoms Strength training completed today  Goals Unmet:  Not Applicable  Goals Comments:  Patient completed exercise prescription and all exercise goals during rehab session. The exercise was tolerated well and the patient is progressing in the program.    Dr. Emily Filbert is Medical Director for [place] and [facility].

## 2015-10-04 DIAGNOSIS — Z951 Presence of aortocoronary bypass graft: Secondary | ICD-10-CM | POA: Diagnosis not present

## 2015-10-04 NOTE — Progress Notes (Signed)
Daily Session Note  Patient Details  Name: Bradley [name] MRN: [id number] Date of Birth: Dec 10, 1945 Referring Provider:  Belva Crome, MD  Encounter Date: 10/04/2015  Check In:     Session Check In - 10/04/15 1620    Check-In   Location [facility]-Cardiac & Pul[facility] Re[facility]   Staff Present Heath Lark, RN, BSN, CCRP;Carroll Enterkin, RN, [employer], BS, ACSM EP-C, Exercise Physiologist   Supervising physician immediately available to respond to emergencies See telemetry face sheet for immediately available ER MD   Medication changes reported     No   Fall or balance concerns reported    No   Warm-up and Cool-down Performed on first and last piece of equipment   Resistance Training Performed No   VAD Patient? No   Pain Assessment   Currently in Pain? No/denies         Goals Met:  Proper associated with RPD/PD & O2 Sat Exercise tolerated well No report of cardiac concerns or symptoms Strength training completed today  Goals Unmet:  Not Applicable  Goals Comments:    Dr. Emily Filbert is Medical Director for [place] and [facility].

## 2015-10-08 DIAGNOSIS — Z951 Presence of aortocoronary bypass graft: Secondary | ICD-10-CM | POA: Diagnosis not present

## 2015-10-08 NOTE — Progress Notes (Signed)
Daily Session Note  Patient Details  Name: KALEM ROCKWELL MRN: [id number] Date of Birth: [birth year] Referring Provider:  Lona Kettle, MD  Encounter Date: 10/08/2015  Check In:     Session Check In - 10/08/15 1737    Check-In   Location [facility]-Cardiac [facility] Pul[facility] Re[facility]   Staff Present Heath Lark, RN, BSN, CCRP;Antonin Meininger, BS, ACSM EP-C, Exercise Physiologist;Rebecca Sickles, PT   Supervising physician immediately available to respond to emergencies See telemetry face sheet for immediately available ER MD   Medication changes reported     No   Fall or balance concerns reported    No   Warm-up and Cool-down Performed on first and last piece of equipment   Resistance Training Performed No   VAD Patient? No   Pain Assessment   Currently in Pain? No/denies         Goals Met:  Proper associated with RPD/PD & O2 Sat Exercise tolerated well No report of cardiac concerns or symptoms Strength training completed today  Goals Unmet:  Not Applicable  Comments:    Dr. Emily Filbert is Medical Director for [place] and [facility].

## 2015-10-10 ENCOUNTER — Encounter: Payer: PPO | Attending: Interventional Cardiology | Admitting: *Deleted

## 2015-10-10 DIAGNOSIS — Z951 Presence of aortocoronary bypass graft: Secondary | ICD-10-CM | POA: Diagnosis not present

## 2015-10-10 NOTE — Addendum Note (Signed)
Addended by: Lynford Humphrey on: 10/10/2015 11:03 AM   Modules accepted: Orders

## 2015-10-10 NOTE — Progress Notes (Signed)
Daily Session Note  Patient Details  Name: Bradley [name] MRN: [id number] Date of Birth: 1945-11-08 Referring Provider:  Belva Crome, MD  Encounter Date: 10/10/2015  Check In:     Session Check In - 10/10/15 1814    Check-In   Location [facility]-Card[facility] Re[facility]   Staff Present Nyoka Cowden, RN;Rebecca Sickles, PT;Skarlet Lyons Joya Gaskins, RN, BSN   Supervising physician immediately available to respond to emergencies See telemetry face sheet for immediately available ER MD   Medication changes reported     No   Fall or balance concerns reported    Yes   Warm-up and Cool-down Performed on first and last piece of equipment   Resistance Training Performed Yes   VAD Patient? No   Pain Assessment   Currently in Pain? No/denies   Pain Score 0-No pain   Multiple Pain Sites No         Goals Met:  Independence with exercise equipment Exercise tolerated well No report of cardiac concerns or symptoms Strength training completed today  Goals Unmet:  Not Applicable  Comments:   Patient completed exercise prescription and all exercise goals during rehab session. The exercise was tolerated well and the patient is progressing in the program.   Dr. Emily Filbert is Medical Director for [place] and [facility].

## 2015-10-11 DIAGNOSIS — Z951 Presence of aortocoronary bypass graft: Secondary | ICD-10-CM | POA: Diagnosis not present

## 2015-10-11 NOTE — Progress Notes (Signed)
Daily Session Note  Patient Details  Name: Bradley [name] MRN: [id number] Date of Birth: 11/12/1945 Referring Provider:  Belva Crome, MD  Encounter Date: 10/11/2015  Check In:     Session Check In - 10/11/15 1600    Check-In   Location [facility]-Card[facility] Pulmonary Re[facility]   Staff Present Roanna Epley, RN, BSN;Carroll Enterkin, RN, BSN;Kyliegh Jester, BS, ACSM EP-C, Exercise Physiologist   Supervising physician immediately available to respond to emergencies See telemetry face sheet for immediately available ER MD   Medication changes reported     No   Fall or balance concerns reported    No   Warm-up and Cool-down Performed on first and last piece of equipment   Resistance Training Performed No   VAD Patient? No   Pain Assessment   Currently in Pain? No/denies         Goals Met:  Proper associated with RPD/PD & O2 Sat Exercise tolerated well No report of cardiac concerns or symptoms Strength training completed today  Goals Unmet:  Not Applicable  Comments:    Dr. Emily Filbert is Medical Director for [place] and [facility].

## 2015-10-11 NOTE — Progress Notes (Signed)
Cardiac Individual Treatment Plan  Patient Details  Name: Bradley [name] MRN: [id number] Date of Birth: 1946-07-06 Referring Provider:  Belva Crome, MD  Initial Encounter Date:    Visit Diagnosis: S/P CABG x 3  Patient's Home Medications on Admission:  Current outpatient prescriptions:  .  Ascorbic Acid (VITAMIN C) 100 MG tablet, Take 100 mg by mouth daily., Disp: , Rfl:  .  aspirin EC 81 MG EC tablet, Take 1 tablet (81 mg total) by mouth daily., Disp: , Rfl:  .  atorvastatin (LIPITOR) 40 MG tablet, Take 40 mg by mouth daily.  , Disp: , Rfl:  .  Calcium Carbonate-Vitamin D (CALCIUM-D) 600-400 MG-UNIT TABS, Take 1 tablet by mouth daily. , Disp: , Rfl:  .  Cholecalciferol (D-3-5) 5000 UNITS capsule, Take 5,000 Units by mouth daily., Disp: , Rfl:  .  clopidogrel (PLAVIX) 75 MG tablet, Take 1 tablet (75 mg total) by mouth daily., Disp: 1 tablet, Rfl: 0 .  dexlansoprazole (DEXILANT) 60 MG capsule, Take 60 mg by mouth daily., Disp: , Rfl:  .  glimepiride (AMARYL) 2 MG tablet, Take 2 mg by mouth daily before breakfast.  , Disp: , Rfl:  .  Hydrocortisone Acetate (PROCTOSOL RE), Place rectally., Disp: , Rfl:  .  IRON, FERROUS GLUCONATE, PO, Take 40 mg by mouth 2 (two) times daily., Disp: , Rfl:  .  Liraglutide (VICTOZA [place]), Inject 1.8 mg into the skin daily. , Disp: , Rfl:  .  losartan-hydrochlorothiazide (HYZAAR) 50-12.5 MG tablet, Take 1 tablet by mouth daily., Disp: 90 tablet, Rfl: 3 .  metFORMIN (GLUCOPHAGE-XR) 500 MG 24 hr tablet, Take 2 tablets (1,000 mg total) by mouth 2 (two) times daily., Disp: , Rfl:  .  metoprolol (LOPRESSOR) 100 MG tablet, Take 1 tablet (100 mg total) by mouth 2 (two) times daily., Disp: 180 tablet, Rfl: 3 .  Multiple Vitamin (MULTIVITAMIN) capsule, Take 1 capsule by mouth daily.  , Disp: , Rfl:  .  traMADol (ULTRAM) 50 MG tablet, Take 1 tablet (50 mg total) by mouth every 6 (six) hours as needed for moderate pain., Disp: 30 tablet, Rfl: 0  Past Medical  History: Past Medical History  Diagnosis Date  . Hyperlipidemia   . Hypertension   . Diabetes mellitus age 31  . Peripheral vascular disease ([place])   . Arthritis   . Reflux esophagitis   . Stroke [facility] 1987    Right brain stroke  . Neuropathy ([facility]) 2013  . Osteoporosis 2013  . Sleep apnea     uses CPAP  . GERD (gastroesophageal reflux disease)   . H/O hiatal hernia   . Irritable bowel syndrome (IBS)   . Myocardial infarction [facility])     silent inferior MI; patient denies MI history (03/17/13)   . Anemia   . Carotid artery occlusion     a. Carotid US 10/16: RICA 60-79%, L CEA patent with 1-39% stenosis  . Coronary artery disease     a. Myoview 9/16:  EF 52%, inferior fixed defect consistent with diaphragmatic attenuation, intermediate risk secondary to poor exercise tolerance and symptoms during stress;  b. LHC 05/17/2015 90% mid LAD, 80% ost D2, 75% mid RCA, 35% prox RCA. >> S/p CABG  . Diverticulitis   . Wears glasses   . PAF (paroxysmal atrial fibrillation) ([place])     post CABG; Amiodarone stopped 2/2 wheezing;   . History of echocardiogram     a. Echo 9/16: GLS -15.2%, EF 03-50%, grade 1 diastolic dysfunction, normal  wall motion, aortic sclerosis, dilated aortic root 39 mm, atrial septal lipomatous hypertrophy    Tobacco Use: History  Smoking status  . Former Smoker -- 2.00 packs/day for 40 years  . Types: Cigarettes  . Quit date: 03/26/2009  Smokeless tobacco  . Never Used    Labs: Recent Review Flowsheet Data    Labs for ITP Cardiac and Pulmonary Rehab Latest Ref Rng 05/24/2015 05/24/2015 05/24/2015 05/31/2015 06/01/2015   Cholestrol 0 - 200 mg/dL - - - 83 82   LDLCALC 0 - 99 mg/dL - - - 36 26   HDL >40 mg/dL - - - 26(L) 24(L)   Trlycerides <150 mg/dL - - - 104 158(H)   PHART 7.350 - 7.450 - 7.297(L) - - -   PCO2ART 35.0 - 45.0 mmHg - 43.4 - - -   HCO3 20.0 - 24.0 mEq/L - 21.2 - - -   TCO2 0 - 100 mmol/L _0 - -   ACIDBASEDEF 0.0 - 2.0 mmol/L - 5.0(H) - - -    O2SAT - - 91.0 - - -       Exercise Target Goals:    Exercise Program Goal: Individual exercise prescription set with THRR, safety & activity barriers. Participant demonstrates ability to understand and report RPE using BORG scale, to self-measure pulse accurately, and to acknowledge the importance of the exercise prescription.  Exercise Prescription Goal: Starting with aerobic activity 30 plus minutes a day, 3 days per week for initial exercise prescription. Provide home exercise prescription and guidelines that participant acknowledges understanding prior to discharge.  Activity Barriers & Risk Stratification:     Activity Barriers & Cardiac Risk Stratification - 07/02/15 1348    Activity Barriers & Cardiac Risk Stratification   Activity Barriers Back Problems   Cardiac Risk Stratification High      6 Minute Walk:     6 Minute Walk      07/02/15 1444       6 Minute Walk   Phase Initial     Distance 1175 feet     Walk Time 6 minutes     RPE 12     Perceived Dyspnea  0     Symptoms No     Resting HR 98 bpm     Resting BP 138/62 mmHg     Max Ex. HR 102 bpm     Max Ex. BP 140/78 mmHg        Initial Exercise Prescription:     Initial Exercise Prescription - 07/02/15 1500    Date of Initial Exercise Prescription   Date 07/02/15   Treadmill   MPH 2.5   Grade 0   Minutes 15   Bike   Level 1   Minutes 15   Recumbant Bike   Level 2   Watts 30   Minutes 15   NuStep   Level 3   Watts 40   Minutes 15   Arm Ergometer   Level 1   Watts 10   Minutes 15   Arm/Foot Ergometer   Level 1   Watts 12   Minutes 15   Cybex   Level 2   RPM 40   Minutes 15   Recumbant Elliptical   Level 1   Watts 20   Minutes 15   Elliptical   Level 1   Speed 2.5   Minutes 5   REL-XR   Level 3   Watts 30   Minutes 15   T5 Nustep  Level 2   Watts 20   Minutes 15   Biostep-RELP   Level 3   Watts 30   Minutes 15   Prescription Details   Frequency (times per  week) 3   Duration Progress to 30 minutes of continuous aerobic without signs/symptoms of physical distress   Intensity   THRR REST +  30   Ratings of Perceived Exertion 11-15   Perceived Dyspnea 2-4   Progression   Progression Continue progressive overload as per policy without signs/symptoms or physical distress.   Resistance Training   Training Prescription Yes   Weight 2   Reps 10-15      Exercise Prescription Changes:     Exercise Prescription Changes      07/09/15 1439 07/18/15 1600 08/02/15 1249 08/27/15 1100 09/05/15 1700   Exercise Review   Progression Yes Yes Yes No  Absent since 08/09/15 No  Absent since 08/09/15   Response to Exercise   Blood Pressure (Admit) 112/72 mmHg  130/68 mmHg 128/62 mmHg    Blood Pressure (Exercise) 158/62 mmHg  148/70 mmHg 138/76 mmHg    Blood Pressure (Exit) 128/70 mmHg  122/60 mmHg 124/60 mmHg    Heart Rate (Admit) 82 bpm  61 bpm 65 bpm    Heart Rate (Exercise) 96 bpm  97 bpm 93 bpm    Heart Rate (Exit) 81 bpm  64 bpm 64 bpm    Rating of Perceived Exertion (Exercise) _0 Symptoms Back pain at times  No new pain other than chronic back pain No new pain other than chronic back pain    Comments First day of exercise! Patient was oriented to the gym and the equipment functions and settings. Procedures and policies of the gym were outlined and explained. The patient's individual exercise prescription and treatment plan were reviewed with them. All starting workloads were established based on the results of the functional testing  done at the initial intake visit. The plan for exercise progression was also introduced and progression will be customized based on the patient's performance and goals.   Romualdo is starting to plateau on some of his exercise progressions. Progression will be slower now that he is working close to his full capacity on each machine. He can continuously exercise for the entire class. We will now use interval training  to progress him with his exercise intensity.  Torence has been absent since 08/09/15. His workloads may need to be re-evaluated based on his current PA level.     Frequency     Add 2 additional days to program exercise sessions.  Patsy plans to walk at home or go to gym to walk on TM.     Duration Progress to 30 minutes of continuous aerobic without signs/symptoms of physical distress Progress to 30 minutes of continuous aerobic without signs/symptoms of physical distress Progress to 30 minutes of continuous aerobic without signs/symptoms of physical distress Progress to 30 minutes of continuous aerobic without signs/symptoms of physical distress Progress to 30 minutes of continuous aerobic without signs/symptoms of physical distress   Intensity Rest + 30 Rest + 30 Rest + 30 Rest + 30 Rest + 30   Progression   Progression Continue progressive overload as per policy without signs/symptoms or physical distress. Continue progressive overload as per policy without signs/symptoms or physical distress. Continue progressive overload as per policy without signs/symptoms or physical distress. Continue progressive overload as per policy without signs/symptoms or physical distress. Continue progressive overload as  per policy without signs/symptoms or physical distress.   Resistance Training   Training Prescription (read-only) _0    Weight (read-only) _1 Reps (read-only) 10-15 10-15 10-15 10-15 10-15   Interval Training   Interval Training No No Yes Yes Yes   Equipment   --  BioStep --  BioStep --  BioStep   Comments   Level 4 for 3 minutes, Level 6 for 1 minute. Repeat for 10-15 minutes Level 4 for 3 minutes, Level 6 for 1 minute. Repeat for 10-15 minutes Level 4 for 3 minutes, Level 6 for 1 minute. Repeat for 10-15 minutes   Treadmill   MPH (read-only)   2.8 2.8 2.8   Grade (read-only)   0 0 0   Minutes (read-only)   _2 Recumbant Bike   Level (read-only) _3 RPM  (read-only) 50 50 50 50 50   Minutes (read-only) _4 NuStep   Level (read-only) _5 Watts (read-only) 50 50 50 50 50   Minutes (read-only) _6 Biostep-RELP   Level (read-only)  _7 Watts (read-only)  40 40 40 40   Minutes (read-only)  _8 09/12/15 1600 09/25/15 0700         Exercise Review   Progression  Yes      Response to Exercise   Blood Pressure (Admit)  132/80 mmHg      Blood Pressure (Exercise)  144/80 mmHg      Blood Pressure (Exit)  114/65 mmHg      Heart Rate (Admit)  81 bpm      Heart Rate (Exercise)  104 bpm      Heart Rate (Exit)  85 bpm      Rating of Perceived Exertion (Exercise)  13      Symptoms None None      Comments Sheamus has maintained much regular attendance and has resumed previous workloads. He is also being conscious of exercising outside of class and reported that he has exercised on the "off" days of class each day this week. He is very proud of that. Avery wants to focus the rest of his time in rehab on his walking. We have progressed him on the TM up to his max walking speed and provided further stimulus through an incline. We has done well with the ex. increases and has completed all progression suggestions without incident. He maintains exercise at home on days he does not come to class. We will follow up with him in the coming weeks on what we can do to provide him challenges and help him meet his exercise goals.       Frequency Add 2 additional days to program exercise sessions.  Jenny is walking at home on days he doesn't come to rehab Add 2 additional days to program exercise sessions.  Kayden is walking at home on days he doesn't come to rehab      Duration Progress to 30 minutes of continuous aerobic without signs/symptoms of physical distress Progress to 30 minutes of continuous aerobic without signs/symptoms of physical distress      Intensity Rest + 30 Rest + 30      Progression    Progression Continue progressive overload as per policy without signs/symptoms or physical distress. Continue  progressive overload as per policy without signs/symptoms or physical distress.      Resistance Training   Training Prescription (read-only) Yes Yes      Weight (read-only) 3 4      Reps (read-only) 10-15 10-15      Interval Training   Interval Training Yes Yes      Equipment --  BioStep --  BioStep      Comments Level 4 for 3 minutes, Level 6 for 1 minute. Repeat for 10-15 minutes Level 4 for 3 minutes, Level 6 for 1 minute. Repeat for 10-15 minutes      Treadmill   MPH (read-only) 2.8 3      Grade (read-only) 0 2      Minutes (read-only) 20 35      Recumbant Bike   Level (read-only) 3 3      RPM (read-only) 50 50      Minutes (read-only) 20 20      NuStep   Level (read-only) 3 3      Watts (read-only) 50 50      Minutes (read-only) 20 20      Biostep-RELP   Level (read-only) 6 6      Watts (read-only) 40 40      Minutes (read-only) 20 20         Discharge Exercise Prescription (Final Exercise Prescription Changes):     Exercise Prescription Changes - 09/25/15 0700    Exercise Review   Progression Yes   Response to Exercise   Blood Pressure (Admit) 132/80 mmHg   Blood Pressure (Exercise) 144/80 mmHg   Blood Pressure (Exit) 114/65 mmHg   Heart Rate (Admit) 81 bpm   Heart Rate (Exercise) 104 bpm   Heart Rate (Exit) 85 bpm   Rating of Perceived Exertion (Exercise) 13   Symptoms None   Comments Jandel wants to focus the rest of his time in rehab on his walking. We have progressed him on the TM up to his max walking speed and provided further stimulus through an incline. We has done well with the ex. increases and has completed all progression suggestions without incident. He maintains exercise at home on days he does not come to class. We will follow up with him in the coming weeks on what we can do to provide him challenges and help him meet his exercise goals.     Frequency Add 2 additional days to program exercise sessions.  Karter is walking at home on days he doesn't come to rehab   Duration Progress to 30 minutes of continuous aerobic without signs/symptoms of physical distress   Intensity Rest + 30   Progression   Progression Continue progressive overload as per policy without signs/symptoms or physical distress.   Resistance Training   Training Prescription (read-only) Yes   Weight (read-only) 4   Reps (read-only) 10-15   Interval Training   Interval Training Yes   Equipment --  BioStep   Comments Level 4 for 3 minutes, Level 6 for 1 minute. Repeat for 10-15 minutes   Treadmill   MPH (read-only) 3   Grade (read-only) 2   Minutes (read-only) 35   Recumbant Bike   Level (read-only) 3   RPM (read-only) 50   Minutes (read-only) 20   NuStep   Level (read-only) 3   Watts (read-only) 50   Minutes (read-only) 20   Biostep-RELP   Level (read-only) 6   Watts (read-only) 40   Minutes (read-only) 20  Nutrition:  Target Goals: Understanding of nutrition guidelines, daily intake of sodium <1540m, cholesterol <2068m calories 30% from fat and 7% or less from saturated fats, daily to have 5 or more servings of fruits and vegetables.  Biometrics:     Pre Biometrics - 07/02/15 1447    Pre Biometrics   Waist Circumference 43 inches   Hip Circumference 38 inches   Waist to Hip Ratio 1.13 %       Nutrition Therapy Plan and Nutrition Goals:     Nutrition Therapy & Goals - 07/02/15 1451    Nutrition Therapy   Drug/Food Interactions Statins/Certain Fruits   Personal Nutrition Goals   Personal Goal #1 Cont. to eat heart healthy and eat to control his diabetes.    Comments GeRonakeports that his wife already has him on a heart healthy and a diabetic eating plan. GeFerlinaid he did not feel he needed to meet individually with the Cardiac Rehab Registered dietician.    Intervention Plan   Intervention (read-only) Using nutrition plan  and personal goals to gain a healthy nutrition lifestyle. Add exercise as prescribed.      Nutrition Discharge: Rate Your Plate Scores:     Nutrition Assessments - 09/27/15 1725    Rate Your Plate Scores   Post Score 74   Post Score % 82.22 %      Nutrition Goals Re-Evaluation:     Nutrition Goals Re-Evaluation      08/29/15 1605 10/11/15 1650         Personal Goal #1 Re-Evaluation   Goal Progress Seen Yes Yes      Comments GeEluzeraid that his wife had shoulder surgery 2 weeks ago and people are bringing apple pies and coconut pies to his house so it is hard to eat correctly at times but he says he usually eats healthy to control his blood sugars in the 100 range.  GeShammondaid his blood sugars have been up to 150's since his wife had shoulder surgery and they have been eating out more. GeTrevelleeports the MD recently stopped his Diltiazem and increased his metoprolol from 5065mhree times a day to 100m12mice a day which has really made him feel tired. He has a follow up appt with tthat MD on March 9th         Psychosocial: Target Goals: Acknowledge presence or absence of depression, maximize coping skills, provide positive support system. Participant is able to verbalize types and ability to use techniques and skills needed for reducing stress and depression.  Initial Review & Psychosocial Screening:     Initial Psych Review & Screening - 07/02/15 1448Good Hopes   Comments GeorNoraorts that his wife feeds him good food based on a diabetic diet and now a heart diet.    Barriers   Psychosocial barriers to participate in program There are no identifiable barriers or psychosocial needs.   Screening Interventions   Interventions Encouraged to exercise      Quality of Life Scores:     Quality of Life - 09/21/15 1016    Quality of Life Scores   Health/Function Post 24.87 %   Health/Function % Change 21.9 %   Socioeconomic Post 25.36  %   Socioeconomic % Change -4 %   Psych/Spiritual Post 24.57 %   Psych/Spiritual % Change -9 %   Family Post 26.4 %   Family % Change -8.3 %  Tiandre's wife  had shoulder surgery recently.    GLOBAL Post 25.13 %   GLOBAL % Change 3.7 %      PHQ-9:     Recent Review Flowsheet Data    Depression screen [facility] 2/9 09/21/2015 07/02/2015 06/21/2015   Decreased Interest 0 2 0   Down, Depressed, Hopeless 0 1 0   PHQ - 2 Score 0 3 0   Altered sleeping 0 0 -   Tired, decreased energy 0 2 -   Change in appetite 0 0 -   Feeling bad or failure about yourself  0 0 -   Trouble concentrating 0 0 -   Moving slowly or fidgety/restless 0 1 -   Suicidal thoughts 0 0 -   PHQ-9 Score 0 6 -   Difficult doing work/chores - Somewhat difficult -      Psychosocial Evaluation and Intervention:     Psychosocial Evaluation - 08/01/15 1658    Psychosocial Evaluation & Interventions   Comments Met with Mr. Hyser today for psychosocial evaluation reporting triple bypass on 10/12.  He has a strong support system with a spouse of 15 years and (2) adult daughters who live close by.  Mr. Jerilynn Mages has diabetes and sleep apnea - but is sleeping fine and his appetite has returned recently.  He denies a history of depression or anxiety or curent symptoms.  He is typically in a positive mood and reports minimal stress in his life currently.  Mr. Jerilynn Mages has goals to continue exercising without being tired, and to lose some more weight while in this program.  His spouse has reported positive benefits already seen since he started this program stating he has "more stamina now  than he has had in the past few years."  Counselor commended Mr. Jerilynn Mages for his progress and his commitment to consistent exercise.       Psychosocial Re-Evaluation:     Psychosocial Re-Evaluation      08/29/15 1608 10/11/15 1651         Psychosocial Re-Evaluation   Interventions Encouraged to attend Cardiac Rehabilitation for the exercise       Comments  Lang has been having to take care of his wife who had shoulder surgery. People are brining food in but no always the healthiest.  Yehudah said his wife is doing better so he is doing better although he is tired due to his metoprolo change.          Vocational Rehabilitation: Provide vocational rehab assistance to qualifying candidates.   Vocational Rehab Evaluation & Intervention:     Vocational Rehab - 07/02/15 1350    Initial Vocational Rehab Evaluation & Intervention   Assessment shows need for Vocational Rehabilitation No      Education: Education Goals: Education classes will be provided on a weekly basis, covering required topics. Participant will state understanding/return demonstration of topics presented.  Learning Barriers/Preferences:   Education Topics: General Nutrition Guidelines/Fats and Fiber: -Group instruction provided by verbal, written material, models and posters to present the general guidelines for heart healthy nutrition. Gives an explanation and review of dietary fats and fiber.          Cardiac Rehab from 09/05/2015 in [facility] Cardiac and Pulmonary Rehab   Date  07/16/15   Educator  PI   Instruction Review Code  2- meets goals/outcomes      Controlling Sodium/Reading Food Labels: -Group verbal and written material supporting the discussion of sodium use in heart healthy nutrition. Review and explanation with models,  verbal and written materials for utilization of the food label.      Cardiac Rehab from 09/05/2015 in [facility] Cardiac and Pulmonary Rehab   Date  07/23/15   Educator  PI   Instruction Review Code  2- meets goals/outcomes      Exercise Physiology & Risk Factors: - Group verbal and written instruction with models to review the exercise physiology of the cardiovascular system and associated critical values. Details cardiovascular disease risk factors and the goals associated with each risk factor.   Aerobic Exercise & Resistance Training: -  Gives group verbal and written discussion on the health impact of inactivity. On the components of aerobic and resistive training programs and the benefits of this training and how to safely progress through these programs.      Cardiac Rehab from 09/05/2015 in [facility] Cardiac and Pulmonary Rehab   Date  08/27/15   Educator  RM   Instruction Review Code  2- meets goals/outcomes      Flexibility, Balance, General Exercise Guidelines: - Provides group verbal and written instruction on the benefits of flexibility and balance training programs. Provides general exercise guidelines with specific guidelines to those with heart or lung disease. Demonstration and skill practice provided.      Cardiac Rehab from 09/05/2015 in [facility] Cardiac and Pulmonary Rehab   Date  08/27/15   Educator  RM   Instruction Review Code  2- meets goals/outcomes      Stress Management: - Provides group verbal and written instruction about the health risks of elevated stress, cause of high stress, and healthy ways to reduce stress.   Depression: - Provides group verbal and written instruction on the correlation between heart/lung disease and depressed mood, treatment options, and the stigmas associated with seeking treatment.      Cardiac Rehab from 09/05/2015 in [facility] Cardiac and Pulmonary Rehab   Date  07/25/15   Educator  Juliann Pulse C   Instruction Review Code  2- meets goals/outcomes      Anatomy & Physiology of the Heart: - Group verbal and written instruction and models provide basic cardiac anatomy and physiology, with the coronary electrical and arterial systems. Review of: AMI, Angina, Valve disease, Heart Failure, Cardiac Arrhythmia, Pacemakers, and the ICD.      Cardiac Rehab from 09/05/2015 in [facility] Cardiac and Pulmonary Rehab   Date  09/03/15   Educator  SB   Instruction Review Code  2- meets goals/outcomes      Cardiac Procedures: - Group verbal and written instruction and models to describe the testing  methods done to diagnose heart disease. Reviews the outcomes of the test results. Describes the treatment choices: Medical Management, Angioplasty, or Coronary Bypass Surgery.   Cardiac Medications: - Group verbal and written instruction to review commonly prescribed medications for heart disease. Reviews the medication, class of the drug, and side effects. Includes the steps to properly store meds and maintain the prescription regimen.   Go Sex-Intimacy & Heart Disease, Get SMART - Goal Setting: - Group verbal and written instruction through game format to discuss heart disease and the return to sexual intimacy. Provides group verbal and written material to discuss and apply goal setting through the application of the S.M.A.R.T. Method.   Other Matters of the Heart: - Provides group verbal, written materials and models to describe Heart Failure, Angina, Valve Disease, and Diabetes in the realm of heart disease. Includes description of the disease process and treatment options available to the cardiac patient.  Cardiac Rehab from 09/05/2015 in [facility] Cardiac and Pulmonary Rehab   Date  07/11/15   Educator  DW   Instruction Review Code  2- meets goals/outcomes      Exercise & Equipment Safety: - Individual verbal instruction and demonstration of equipment use and safety with use of the equipment.      Cardiac Rehab from 09/05/2015 in [facility] Cardiac and Pulmonary Rehab   Date  07/02/15   Educator  C. EnterkinRN   Instruction Review Code  1- partially meets, needs review/practice      Infection Prevention: - Provides verbal and written material to individual with discussion of infection control including proper hand washing and proper equipment cleaning during exercise session.      Cardiac Rehab from 09/05/2015 in [facility] Cardiac and Pulmonary Rehab   Date  07/02/15   Educator  C. Ralls   Instruction Review Code  1- partially meets, needs review/practice      Falls Prevention: -  Provides verbal and written material to individual with discussion of falls prevention and safety.      Cardiac Rehab from 09/05/2015 in [facility] Cardiac and Pulmonary Rehab   Date  07/02/15   Educator  C. Leonard   Instruction Review Code  2- meets goals/outcomes      Diabetes: - Individual verbal and written instruction to review signs/symptoms of diabetes, desired ranges of glucose level fasting, after meals and with exercise. Advice that pre and post exercise glucose checks will be done for 3 sessions at entry of program.      Cardiac Rehab from 09/05/2015 in [facility] Cardiac and Pulmonary Rehab   Date  07/02/15   Educator  C. EnterkinRN   Instruction Review Code  2- meets goals/outcomes       Knowledge Questionnaire Score:     Knowledge Questionnaire Score - 09/21/15 1020    Knowledge Questionnaire Score   Post Score 25      Personal Goals and Risk Factors at Admission:     Personal Goals and Risk Factors at Admission - 07/02/15 1447    Core Components/Risk Factors/Patient Goals on Admission   Sedentary Yes   Intervention (read-only) While in program, learn and follow the exercise prescription taught. Start at a low level workload and increase workload after able to maintain previous level for 30 minutes. Increase time before increasing intensity.  Goal time to achieve goal by 36 sessions.    Diabetes Yes   Goal Blood glucose control identified by blood glucose values, HgbA1C. Participant verbalizes understanding of the signs/symptoms of hyper/hypo glycemia, proper foot care and importance of medication and nutrition plan for blood glucose control.   Intervention (read-only) Provide nutrition & aerobic exercise along with prescribed medications to achieve blood glucose in normal ranges: Fasting 65-99 mg/dL   Hypertension Yes   Goal Participant will see blood pressure controlled within the values of 140/50m/Hg or within value directed by their physician.   Intervention  (read-only) Provide nutrition & aerobic exercise along with prescribed medications to achieve BP 140/90 or less.   Lipids Yes   Goal Cholesterol controlled with medications as prescribed, with individualized exercise RX and with personalized nutrition plan. Value goals: LDL < 751m HDL > 4036mParticipant states understanding of desired cholesterol values and following prescriptions.   Intervention (read-only) Provide nutrition & aerobic exercise along with prescribed medications to achieve LDL <69m76mDL >40mg25m   Personal Goals and Risk Factors Review:      Goals and Risk Factor  Review      07/09/15 1737 07/25/15 1737 08/27/15 1151 08/29/15 1606 09/12/15 1658   Core Components/Risk Factors/Patient Goals Review   Personal Goals Review Increase Aerobic Exercise and Physical Activity Diabetes;Hypertension;Lipids      Increase Aerobic Exercise and Physical Activity (read-only)   Goals Progress/Improvement seen  No  No     Comments Today was first exsecise session.  Spoke with Kentarius about his overall goals and short term goal ssetting.  He does want to return to his golf game. He states that will be awhile before that happens. Also he wants to return to exercising 30 minutes 3 times a day and doing weights. Short term goal is to attend class 3 days a week and follow the exerciseprescription that he is given.   Jabril has been absent since 08/09/15. His exercise goals and rx. will need to be re-evaluated based on his current PA level.      Diabetes (read-only)   Goal     Blood glucose control identified by blood glucose values, HgbA1C. Participant verbalizes understanding of the signs/symptoms of hyper/hypo glycemia, proper foot care and importance of medication and nutrition plan for blood glucose control.   Progress seen towards goals  Yes  Yes Yes   Comments  Julyan  states he is changing his eating habits more fruit and vegs, less fried foods, decreased his starches, added more beans/legumes.   Lukis said the past 2 weeks have been tough since his wife had shoulder surgery and she usually cooks healthy for him. He asked about our cafeteria and I said that they have green dots for healthy food.  Recent HgbA1C 7.2%    Glennon continues to work on eating habits and exercise. Limited some currently because his wife had surgery and is not cooking the "healthy food".   Hypertension (read-only)   Goal     Participant will see blood pressure controlled within the values of 140/77m/Hg or within value directed by their physician.   Progress seen toward goals  Yes  Yes    Comments  GJimmey states he is changing his eating habits more fruit and vegs, less fried foods, decreased his starches, added more beans/legumes. He is keeping an eye on his sodium intake.  Blood pressure has been stable -see telemetry sheets.  BP remains in good range when in class. Geroge reported it was elevated in MD office today.  He has a med change and a new med prescribed.   Abnormal Lipids (read-only)   Progress seen towards goals  Yes  Yes    Comments  GDomingos states he is changing his eating habits more fruit and vegs, less fried foods, decreased his starches, added more beans/legumes. He is progressing with his exercise program  NMarcelino Camposhas been trying to eat more fruits andvegetables even when they go out to eat.       09/12/15 1701 10/11/15 1650         Core Components/Risk Factors/Patient Goals Review   Personal Goals Review  Diabetes;Hypertension;Lipids      Review  GJaciersaid his blood sugars have been up to 150's since his wife had shoulder surgery and they have been eating out more. GGuilhermereports the MD recently stopped his Diltiazem and increased his metoprolol from 540mthree times a day to 10076mwice a day which has really made him feel tired. He has a follow up appt with tthat MD on March 9th      Expected Outcomes  Akeem feels sure with his wife cooking more soon his blood sugars will be back in  the 90s like his normal.       Abnormal Lipids (read-only)   Comments COntinues to work on exercise and eating the right foods amd amounts          Personal Goals Discharge (Final Personal Goals and Risk Factors Review):      Goals and Risk Factor Review - 10/11/15 1650    Core Components/Risk Factors/Patient Goals Review   Personal Goals Review Diabetes;Hypertension;Lipids   Review Ronda said his blood sugars have been up to 150's since his wife had shoulder surgery and they have been eating out more. Kouper reports the MD recently stopped his Diltiazem and increased his metoprolol from 64m three times a day to 1025mtwice a day which has really made him feel tired. He has a follow up appt with tthat MD on March 9th   Expected Outcomes GeHenrieels sure with his wife cooking more soon his blood sugars will be back in the 90s like his normal.       ITP Comments:     ITP Comments      07/17/15 1318 08/12/15 1408 09/07/15 1227 09/19/15 1809 09/21/15 1021   ITP Comments 30 day review preparation  Continue with ITP   New to program only a few visits completed Ready for 30 day review.  Continue with ITP Ready fro 30 day review, Continue with ITP.  GeEmerileported his MD has changed some of his medications.  He is no longer on Diltiazem.  Metoprolol has been increased to 1006mwice daily.  Losartan-HCTZ 50-12.5 mg is one tablet once a day.  Plavix 75 mg once a day.  These changes have already been made to his medication list.  See Medication List for Current Medications.   Khy's wife had shoulder surgery so he has been having to help take care of her also.      10/02/15 0714 10/11/15 1648         ITP Comments 30 day review.  Continue with ITP GeoSonyaid his blood sugars have been up to 150's since his wife had shoulder surgery and they have been eating out more. GeoChaseports the MD recently stopped his Diltiazem and increased his metoprolol from 67m39mree times a day to 100mg35mce a  day which has really made him feel tired. He has a follow up appt with tthat MD on March 9th.          Comments:

## 2015-10-12 DIAGNOSIS — G4733 Obstructive sleep apnea (adult) (pediatric): Secondary | ICD-10-CM | POA: Diagnosis not present

## 2015-10-15 VITALS — Ht 71.8 in | Wt 208.0 lb

## 2015-10-15 DIAGNOSIS — G4733 Obstructive sleep apnea (adult) (pediatric): Secondary | ICD-10-CM | POA: Diagnosis not present

## 2015-10-15 DIAGNOSIS — Z951 Presence of aortocoronary bypass graft: Secondary | ICD-10-CM

## 2015-10-15 NOTE — Progress Notes (Signed)
Daily Session Note  Patient Details  Name: Bradley [name] MRN: [id number] Date of Birth: Apr 17, 1946 Referring Provider:  Lona Kettle, MD  Encounter Date: 10/15/2015  Check In:     Session Check In - 10/15/15 1710    Check-In   Location [facility]-Cardiac & Pulmonary Rehab   Staff Present Heath Lark, RN, BSN, CCRP;Stevenson Windmiller, BS, ACSM EP-C, Exercise Physiologist;Rebecca Sickles, PT   Supervising physician immediately available to respond to emergencies See telemetry face sheet for immediately available ER MD   Medication changes reported     No   Fall or balance concerns reported    No   Warm-up and Cool-down Performed on first and last piece of equipment   Resistance Training Performed No   VAD Patient? No   Pain Assessment   Currently in Pain? No/denies         Goals Met:  Proper associated with RPD/PD & O2 Sat Exercise tolerated well No report of cardiac concerns or symptoms Strength training completed today  Goals Unmet:  Not Applicable  Comments:   Dr. Emily Filbert is Medical Director for [place] and [facility].

## 2015-10-17 ENCOUNTER — Encounter: Payer: PPO | Admitting: *Deleted

## 2015-10-17 VITALS — BP 140/78 | HR 87 | Wt 208.0 lb

## 2015-10-17 DIAGNOSIS — Z951 Presence of aortocoronary bypass graft: Secondary | ICD-10-CM

## 2015-10-17 NOTE — Progress Notes (Signed)
Daily Session Note  Patient Details  Name: CORDELLE DAHMEN MRN: [id number] Date of Birth: May 11, 1946 Referring Provider:  Lona Kettle, MD  Encounter Date: 10/17/2015  Check In:     Session Check In - 10/17/15 1733    Check-In   Location [facility]-Cardiac & Pulmonary Rehab   Staff Present Gerlene Burdock, RN, Drusilla Kanner, MS, ACSM CEP, Exercise Physiologist;Saqib Cazarez Joya Gaskins, RN, BSN   Supervising physician immediately available to respond to emergencies See telemetry face sheet for immediately available ER MD   Medication changes reported     No   Fall or balance concerns reported    No   Warm-up and Cool-down Performed on first and last piece of equipment   Resistance Training Performed Yes   VAD Patient? No   Pain Assessment   Currently in Pain? No/denies         Goals Met:  Independence with exercise equipment Exercise tolerated well No report of cardiac concerns or symptoms Strength training completed today  Goals Unmet:  Not Applicable  Comments:  Today is Nicolaos's last session.  He completed 36/36 sessions today.  He plans to continue exercising, as he and his wife are planning to join a local gym.     Dr. Emily Filbert is Medical Director for [place] and [facility].

## 2015-10-17 NOTE — Patient Instructions (Signed)
Discharge Instructions  Patient Details  Name: Bradley [name] MRN: [id number] Date of Birth: Sep 12, 1945 Referring Provider:  Lona Kettle, MD   Number of Visits: 36   Reason for Discharge:  Patient reached a stable level of exercise. Patient independent in their exercise.  Smoking History:  History  Smoking status  . Former Smoker -- 2.00 packs/day for 40 years  . Types: Cigarettes  . Quit date: 03/26/2009  Smokeless tobacco  . Never Used    Diagnosis:  S/P CABG x 3  Initial Exercise Prescription:     Initial Exercise Prescription - 07/02/15 1500    Date of Initial Exercise Prescription   Date 07/02/15   Treadmill   MPH 2.5   Grade 0   Minutes 15   Bike   Level 1   Minutes 15   Recumbant Bike   Level 2   Watts 30   Minutes 15   NuStep   Level 3   Watts 40   Minutes 15   Arm Ergometer   Level 1   Watts 10   Minutes 15   Arm/Foot Ergometer   Level 1   Watts 12   Minutes 15   Cybex   Level 2   RPM 40   Minutes 15   Recumbant Elliptical   Level 1   Watts 20   Minutes 15   Elliptical   Level 1   Speed 2.5   Minutes 5   REL-XR   Level 3   Watts 30   Minutes 15   T5 Nustep   Level 2   Watts 20   Minutes 15   Biostep-RELP   Level 3   Watts 30   Minutes 15   Prescription Details   Frequency (times per week) 3   Duration Progress to 30 minutes of continuous aerobic without signs/symptoms of physical distress   Intensity   THRR REST +  30   Ratings of Perceived Exertion 11-15   Perceived Dyspnea 2-4   Progression   Progression Continue progressive overload as per policy without signs/symptoms or physical distress.   Resistance Training   Training Prescription Yes   Weight 2   Reps 10-15      Discharge Exercise Prescription (Final Exercise Prescription Changes):     Exercise Prescription Changes - 10/16/15 0700    Exercise Review   Progression --   Response to Exercise   Blood Pressure (Admit) 132/80 mmHg   Blood Pressure  (Exercise) 144/80 mmHg   Blood Pressure (Exit) 114/65 mmHg   Heart Rate (Admit) 81 bpm   Heart Rate (Exercise) 104 bpm   Heart Rate (Exit) 85 bpm   Rating of Perceived Exertion (Exercise) 13   Symptoms None   Comments Patient is approaching graduation of the program and home exercise plans were discussed. Details of the patient's exercise prescription and what they need to do in order to continue the prescription and progress with exercise were outlined and the patient verbalized understanding. The patient plans to complete all exercise with his personal trainer 2d/wk and walk on the other 3 days.   Frequency Add 2 additional days to program exercise sessions.  Bradley [name] is walking at home on days he doesn't come to rehab   Duration Progress to 30 minutes of continuous aerobic without signs/symptoms of physical distress   Intensity Rest + 30   Progression   Progression Continue progressive overload as per policy without signs/symptoms or physical distress.   Interval Training  Interval Training Yes   Equipment --  BioStep   Comments Level 4 for 3 minutes, Level 6 for 1 minute. Repeat for 10-15 minutes   Home Exercise Plan   Plans to continue exercise at [facility] (comment)  Has personal trainer      Functional Capacity:     6 Minute Walk      07/02/15 1444 10/16/15 0703     6 Minute Walk   Phase Initial Discharge    Distance 1175 feet 1450 feet    Distance % Change  23 %    Walk Time 6 minutes 6 minutes    # of Rest Breaks  0    RPE 12 12    Perceived Dyspnea  0     Symptoms No No    Resting HR 98 bpm 86 bpm    Resting BP 138/62 mmHg 132/70 mmHg    Max Ex. HR 102 bpm 100 bpm    Max Ex. BP 140/78 mmHg 146/70 mmHg       Quality of Life:     Quality of Life - 09/21/15 1016    Quality of Life Scores   Health/Function Post 24.87 %   Health/Function % Change 21.9 %   Socioeconomic Post 25.36 %   Socioeconomic % Change -4 %   Psych/Spiritual Post 24.57 %    Psych/Spiritual % Change -9 %   Family Post 26.4 %   Family % Change -8.3 %  Marcella's wife had shoulder surgery recently.    GLOBAL Post 25.13 %   GLOBAL % Change 3.7 %      Personal Goals: Goals established at orientation with interventions provided to work toward goal.     Personal Goals and Risk Factors at Admission - 07/02/15 1447    Core Components/Risk Factors/Patient Goals on Admission   Sedentary Yes   Intervention (read-only) While in program, learn and follow the exercise prescription taught. Start at a low level workload and increase workload after able to maintain previous level for 30 minutes. Increase time before increasing intensity.  Goal time to achieve goal by 36 sessions.    Diabetes Yes   Goal Blood glucose control identified by blood glucose values, HgbA1C. Participant verbalizes understanding of the signs/symptoms of hyper/hypo glycemia, proper foot care and importance of medication and nutrition plan for blood glucose control.   Intervention (read-only) Provide nutrition & aerobic exercise along with prescribed medications to achieve blood glucose in normal ranges: Fasting 65-99 mg/dL   Hypertension Yes   Goal Participant will see blood pressure controlled within the values of 140/64mm/Hg or within value directed by their physician.   Intervention (read-only) Provide nutrition & aerobic exercise along with prescribed medications to achieve BP 140/90 or less.   Lipids Yes   Goal Cholesterol controlled with medications as prescribed, with individualized exercise RX and with personalized nutrition plan. Value goals: LDL < 70mg , HDL > 40mg . Participant states understanding of desired cholesterol values and following prescriptions.   Intervention (read-only) Provide nutrition & aerobic exercise along with prescribed medications to achieve LDL 70mg , HDL >40mg .       Personal Goals Discharge:     Goals and Risk Factor Review - 10/11/15 1650    Core Components/Risk  Factors/Patient Goals Review   Personal Goals Review Diabetes;Hypertension;Lipids   Review Bradley [name] said his blood sugars have been up to 150's since his wife had shoulder surgery and they have been eating out more. Bradley [name] reports the MD recently stopped his Diltiazem  and increased his metoprolol from 50mg  three times a day to 100mg  twice a day which has really made him feel tired. He has a follow up appt with tthat MD on March 9th   Expected Outcomes Bradley [name] feels sure with his wife cooking more soon his blood sugars will be back in the 90s like his normal.       Nutrition & Weight - Outcomes:     Pre Biometrics - 07/02/15 1447    Pre Biometrics   Waist Circumference 43 inches   Hip Circumference 38 inches   Waist to Hip Ratio 1.13 %         Post Biometrics - 10/16/15 0703     Post  Biometrics   Height 5' 11.8" (1.824 m)   Weight 208 lb (94.348 kg)   Waist Circumference 44.5 inches   Hip Circumference 41 inches   Waist to Hip Ratio 1.09 %   BMI (Calculated) 28.4      Nutrition:     Nutrition Therapy & Goals - 07/02/15 1451    Nutrition Therapy   Drug/Food Interactions Statins/Certain Fruits   Personal Nutrition Goals   Personal Goal #1 Cont. to eat heart healthy and eat to control his diabetes.    Comments Bradley [name] reports that his wife already has him on a heart healthy and a diabetic eating plan. Bradley [name] said he did not feel he needed to meet individually with the Cardiac Rehab Registered dietician.    Intervention Plan   Intervention (read-only) Using nutrition plan and personal goals to gain a healthy nutrition lifestyle. Add exercise as prescribed.      Nutrition Discharge:     Nutrition Assessments - 09/27/15 1725    Rate Your Plate Scores   Post Score 74   Post Score % 82.22 %      Education Questionnaire Score:     Knowledge Questionnaire Score - 09/21/15 1020    Knowledge Questionnaire Score   Post Score 25      Goals reviewed with patient; copy  given to patient.

## 2015-10-18 ENCOUNTER — Ambulatory Visit (INDEPENDENT_AMBULATORY_CARE_PROVIDER_SITE_OTHER): Payer: PPO | Admitting: Interventional Cardiology

## 2015-10-18 ENCOUNTER — Encounter: Payer: Self-pay | Admitting: Interventional Cardiology

## 2015-10-18 VITALS — BP 138/80 | HR 72 | Ht 72.0 in | Wt 206.0 lb

## 2015-10-18 DIAGNOSIS — I4891 Unspecified atrial fibrillation: Secondary | ICD-10-CM

## 2015-10-18 DIAGNOSIS — I63231 Cerebral infarction due to unspecified occlusion or stenosis of right carotid arteries: Secondary | ICD-10-CM

## 2015-10-18 DIAGNOSIS — I4510 Unspecified right bundle-branch block: Secondary | ICD-10-CM

## 2015-10-18 DIAGNOSIS — I739 Peripheral vascular disease, unspecified: Secondary | ICD-10-CM | POA: Diagnosis not present

## 2015-10-18 DIAGNOSIS — I25118 Atherosclerotic heart disease of native coronary artery with other forms of angina pectoris: Secondary | ICD-10-CM

## 2015-10-18 NOTE — Progress Notes (Signed)
Cardiac Individual Treatment Plan  Patient Details  Name: Bradley [name] MRN: [id number] Date of Birth: 1946-01-15 Referring Provider:  Gildardo Cranker, MD  Initial Encounter Date:    Visit Diagnosis: S/P CABG x 3  Patient's Home Medications on Admission:  Current outpatient prescriptions:  .  Ascorbic Acid (VITAMIN C) 100 MG tablet, Take 100 mg by mouth daily., Disp: , Rfl:  .  aspirin EC 81 MG EC tablet, Take 1 tablet (81 mg total) by mouth daily., Disp: , Rfl:  .  atorvastatin (LIPITOR) 40 MG tablet, Take 40 mg by mouth daily.  , Disp: , Rfl:  .  Calcium Carbonate-Vitamin D (CALCIUM-D) 600-400 MG-UNIT TABS, Take 1 tablet by mouth daily. , Disp: , Rfl:  .  Cholecalciferol (D-3-5) 5000 UNITS capsule, Take 5,000 Units by mouth daily., Disp: , Rfl:  .  clopidogrel (PLAVIX) 75 MG tablet, Take 1 tablet (75 mg total) by mouth daily., Disp: 1 tablet, Rfl: 0 .  dexlansoprazole (DEXILANT) 60 MG capsule, Take 60 mg by mouth daily., Disp: , Rfl:  .  glimepiride (AMARYL) 2 MG tablet, Take 2 mg by mouth daily before breakfast.  , Disp: , Rfl:  .  Hydrocortisone Acetate (PROCTOSOL RE), Place rectally., Disp: , Rfl:  .  IRON, FERROUS GLUCONATE, PO, Take 40 mg by mouth 2 (two) times daily., Disp: , Rfl:  .  Liraglutide (VICTOZA [place]), Inject 1.8 mg into the skin daily. , Disp: , Rfl:  .  losartan-hydrochlorothiazide (HYZAAR) 50-12.5 MG tablet, Take 1 tablet by mouth daily., Disp: 90 tablet, Rfl: 3 .  metFORMIN (GLUCOPHAGE-XR) 500 MG 24 hr tablet, Take 2 tablets (1,000 mg total) by mouth 2 (two) times daily., Disp: , Rfl:  .  metoprolol (LOPRESSOR) 100 MG tablet, Take 1 tablet (100 mg total) by mouth 2 (two) times daily., Disp: 180 tablet, Rfl: 3 .  Multiple Vitamin (MULTIVITAMIN) capsule, Take 1 capsule by mouth daily.  , Disp: , Rfl:  .  traMADol (ULTRAM) 50 MG tablet, Take 1 tablet (50 mg total) by mouth every 6 (six) hours as needed for moderate pain., Disp: 30 tablet, Rfl: 0  Past Medical  History: Past Medical History  Diagnosis Date  . Hyperlipidemia   . Hypertension   . Diabetes mellitus age 36  . Peripheral vascular disease ([name])   . Arthritis   . Reflux esophagitis   . Stroke [facility]) 1987    Right brain stroke  . Neuropathy ([name]) 2013  . Osteoporosis 2013  . Sleep apnea     uses CPAP  . GERD (gastroesophageal reflux disease)   . H/O hiatal hernia   . Irritable bowel syndrome (IBS)   . Myocardial infarction [facility])     silent inferior MI; patient denies MI history (03/17/13)   . Anemia   . Carotid artery occlusion     a. Carotid US 10/16: RICA 60-79%, L CEA patent with 1-39% stenosis  . Coronary artery disease     a. Myoview 9/16:  EF 52%, inferior fixed defect consistent with diaphragmatic attenuation, intermediate risk secondary to poor exercise tolerance and symptoms during stress;  b. LHC 05/17/2015 90% mid LAD, 80% ost D2, 75% mid RCA, 35% prox RCA. >> S/p CABG  . Diverticulitis   . Wears glasses   . PAF (paroxysmal atrial fibrillation) ([name])     post CABG; Amiodarone stopped 2/2 wheezing;   . History of echocardiogram     a. Echo 9/16: GLS -15.2%, EF 55-60%, grade 1 diastolic dysfunction, normal wall  motion, aortic sclerosis, dilated aortic root 39 mm, atrial septal lipomatous hypertrophy    Tobacco Use: History  Smoking status  . Former Smoker -- 2.00 packs/day for 40 years  . Types: Cigarettes  . Quit date: 03/26/2009  Smokeless tobacco  . Never Used    Labs: Recent Review Flowsheet Data    Labs for ITP Cardiac and Pulmonary Rehab Latest Ref Rng 05/24/2015 05/24/2015 05/24/2015 05/31/2015 06/01/2015   Cholestrol 0 - 200 mg/dL - - - 83 82   LDLCALC 0 - 99 mg/dL - - - 36 26   HDL >40 mg/dL - - - 26(L) 24(L)   Trlycerides <150 mg/dL - - - 104 158(H)   PHART 7.350 - 7.450 - 7.297(L) - - -   PCO2ART 35.0 - 45.0 mmHg - 43.4 - - -   HCO3 20.0 - 24.0 mEq/L - 21.2 - - -   TCO2 0 - 100 mmol/L '20 22 19 '$ - -   ACIDBASEDEF 0.0 - 2.0 mmol/L - 5.0(H) - - -    O2SAT - - 91.0 - - -       Exercise Target Goals:    Exercise Program Goal: Individual exercise prescription set with THRR, safety & activity barriers. Participant demonstrates ability to understand and report RPE using BORG scale, to self-measure pulse accurately, and to acknowledge the importance of the exercise prescription.  Exercise Prescription Goal: Starting with aerobic activity 30 plus minutes a day, 3 days per week for initial exercise prescription. Provide home exercise prescription and guidelines that participant acknowledges understanding prior to discharge.  Activity Barriers & Risk Stratification:     Activity Barriers & Cardiac Risk Stratification - 07/02/15 1348    Activity Barriers & Cardiac Risk Stratification   Activity Barriers Back Problems   Cardiac Risk Stratification High      6 Minute Walk:     6 Minute Walk      07/02/15 1444 10/16/15 0703     6 Minute Walk   Phase Initial Discharge    Distance 1175 feet 1450 feet    Distance % Change  23 %    Walk Time 6 minutes 6 minutes    # of Rest Breaks  0    RPE 12 12    Perceived Dyspnea  0     Symptoms No No    Resting HR 98 bpm 86 bpm    Resting BP 138/62 mmHg 132/70 mmHg    Max Ex. HR 102 bpm 100 bpm    Max Ex. BP 140/78 mmHg 146/70 mmHg       Initial Exercise Prescription:     Initial Exercise Prescription - 07/02/15 1500    Date of Initial Exercise Prescription   Date 07/02/15   Treadmill   MPH 2.5   Grade 0   Minutes 15   Bike   Level 1   Minutes 15   Recumbant Bike   Level 2   Watts 30   Minutes 15   NuStep   Level 3   Watts 40   Minutes 15   Arm Ergometer   Level 1   Watts 10   Minutes 15   Arm/Foot Ergometer   Level 1   Watts 12   Minutes 15   Cybex   Level 2   RPM 40   Minutes 15   Recumbant Elliptical   Level 1   Watts 20   Minutes 15   Elliptical   Level 1   Speed 2.5  Minutes 5   REL-XR   Level 3   Watts 30   Minutes 15   T5 Nustep   Level 2    Watts 20   Minutes 15   Biostep-RELP   Level 3   Watts 30   Minutes 15   Prescription Details   Frequency (times per week) 3   Duration Progress to 30 minutes of continuous aerobic without signs/symptoms of physical distress   Intensity   THRR REST +  30   Ratings of Perceived Exertion 11-15   Perceived Dyspnea 2-4   Progression   Progression Continue progressive overload as per policy without signs/symptoms or physical distress.   Resistance Training   Training Prescription Yes   Weight 2   Reps 10-15      Exercise Prescription Changes:     Exercise Prescription Changes      07/09/15 1439 07/18/15 1600 08/02/15 1249 08/27/15 1100 09/05/15 1700   Exercise Review   Progression Yes Yes Yes No  Absent since 08/09/15 No  Absent since 08/09/15   Response to Exercise   Blood Pressure (Admit) 112/72 mmHg  130/68 mmHg 128/62 mmHg    Blood Pressure (Exercise) 158/62 mmHg  148/70 mmHg 138/76 mmHg    Blood Pressure (Exit) 128/70 mmHg  122/60 mmHg 124/60 mmHg    Heart Rate (Admit) 82 bpm  61 bpm 65 bpm    Heart Rate (Exercise) 96 bpm  97 bpm 93 bpm    Heart Rate (Exit) 81 bpm  64 bpm 64 bpm    Rating of Perceived Exertion (Exercise) '11  15 13    '$ Symptoms Back pain at times  No new pain other than chronic back pain No new pain other than chronic back pain    Comments First day of exercise! Patient was oriented to the gym and the equipment functions and settings. Procedures and policies of the gym were outlined and explained. The patient's individual exercise prescription and treatment plan were reviewed with them. All starting workloads were established based on the results of the functional testing  done at the initial intake visit. The plan for exercise progression was also introduced and progression will be customized based on the patient's performance and goals.   Amro is starting to plateau on some of his exercise progressions. Progression will be slower now that he is working  close to his full capacity on each machine. He can continuously exercise for the entire class. We will now use interval training to progress him with his exercise intensity.  Feliz has been absent since 08/09/15. His workloads may need to be re-evaluated based on his current PA level.     Frequency     Add 2 additional days to program exercise sessions.  Tyri plans to walk at home or go to gym to walk on TM.     Duration Progress to 30 minutes of continuous aerobic without signs/symptoms of physical distress Progress to 30 minutes of continuous aerobic without signs/symptoms of physical distress Progress to 30 minutes of continuous aerobic without signs/symptoms of physical distress Progress to 30 minutes of continuous aerobic without signs/symptoms of physical distress Progress to 30 minutes of continuous aerobic without signs/symptoms of physical distress   Intensity Rest + 30 Rest + 30 Rest + 30 Rest + 30 Rest + 30   Progression   Progression Continue progressive overload as per policy without signs/symptoms or physical distress. Continue progressive overload as per policy without signs/symptoms or physical distress. Continue progressive overload  as per policy without signs/symptoms or physical distress. Continue progressive overload as per policy without signs/symptoms or physical distress. Continue progressive overload as per policy without signs/symptoms or physical distress.   Resistance Training   Training Prescription (read-only) Yes Yes Yes Yes Yes   Weight (read-only) '2 3 3 3 3   '$ Reps (read-only) 10-15 10-15 10-15 10-15 10-15   Interval Training   Interval Training No No Yes Yes Yes   Equipment   --  BioStep --  BioStep --  BioStep   Comments   Level 4 for 3 minutes, Level 6 for 1 minute. Repeat for 10-15 minutes Level 4 for 3 minutes, Level 6 for 1 minute. Repeat for 10-15 minutes Level 4 for 3 minutes, Level 6 for 1 minute. Repeat for 10-15 minutes   Treadmill   MPH (read-only)    2.8 2.8 2.8   Grade (read-only)   0 0 0   Minutes (read-only)   '20 20 20   '$ Recumbant Bike   Level (read-only) '3 3 3 3 3   '$ RPM (read-only) 50 50 50 50 50   Minutes (read-only) '20 20 20 20 20   '$ NuStep   Level (read-only) '3 3 3 3 3   '$ Watts (read-only) 50 50 50 50 50   Minutes (read-only) '20 20 20 20 20   '$ Biostep-RELP   Level (read-only)  '6 6 6 6   '$ Watts (read-only)  40 40 40 40   Minutes (read-only)  '20 20 20 20     '$ 09/12/15 1600 09/25/15 0700 10/16/15 0700       Exercise Review   Progression  Yes --     Response to Exercise   Blood Pressure (Admit)  132/80 mmHg 132/80 mmHg     Blood Pressure (Exercise)  144/80 mmHg 144/80 mmHg     Blood Pressure (Exit)  114/65 mmHg 114/65 mmHg     Heart Rate (Admit)  81 bpm 81 bpm     Heart Rate (Exercise)  104 bpm 104 bpm     Heart Rate (Exit)  85 bpm 85 bpm     Rating of Perceived Exertion (Exercise)  13 13     Symptoms None None None     Comments Dominie has maintained much regular attendance and has resumed previous workloads. He is also being conscious of exercising outside of class and reported that he has exercised on the "off" days of class each day this week. He is very proud of that. Ersel wants to focus the rest of his time in rehab on his walking. We have progressed him on the TM up to his max walking speed and provided further stimulus through an incline. We has done well with the ex. increases and has completed all progression suggestions without incident. He maintains exercise at home on days he does not come to class. We will follow up with him in the coming weeks on what we can do to provide him challenges and help him meet his exercise goals.  Patient is approaching graduation of the program and home exercise plans were discussed. Details of the patient's exercise prescription and what they need to do in order to continue the prescription and progress with exercise were outlined and the patient verbalized understanding. The patient plans  to complete all exercise with his personal trainer 2d/wk and walk on the other 3 days.     Frequency Add 2 additional days to program exercise sessions.  Clennon is walking at home on days  he doesn't come to rehab Add 2 additional days to program exercise sessions.  Jahden is walking at home on days he doesn't come to rehab Add 2 additional days to program exercise sessions.  Latrelle is walking at home on days he doesn't come to rehab     Duration Progress to 30 minutes of continuous aerobic without signs/symptoms of physical distress Progress to 30 minutes of continuous aerobic without signs/symptoms of physical distress Progress to 30 minutes of continuous aerobic without signs/symptoms of physical distress     Intensity Rest + 30 Rest + 30 Rest + 30     Progression   Progression Continue progressive overload as per policy without signs/symptoms or physical distress. Continue progressive overload as per policy without signs/symptoms or physical distress. Continue progressive overload as per policy without signs/symptoms or physical distress.     Resistance Training   Training Prescription (read-only) Yes Yes      Weight (read-only) 3 4      Reps (read-only) 10-15 10-15      Interval Training   Interval Training Yes Yes Yes     Equipment --  BioStep --  BioStep --  BioStep     Comments Level 4 for 3 minutes, Level 6 for 1 minute. Repeat for 10-15 minutes Level 4 for 3 minutes, Level 6 for 1 minute. Repeat for 10-15 minutes Level 4 for 3 minutes, Level 6 for 1 minute. Repeat for 10-15 minutes     Treadmill   MPH (read-only) 2.8 3      Grade (read-only) 0 2      Minutes (read-only) 20 35      Recumbant Bike   Level (read-only) 3 3      RPM (read-only) 50 50      Minutes (read-only) 20 20      NuStep   Level (read-only) 3 3      Watts (read-only) 50 50      Minutes (read-only) 20 20      Biostep-RELP   Level (read-only) 6 6      Watts (read-only) 40 40      Minutes (read-only) 20 20       Home Exercise Plan   Plans to continue exercise at   [employer] (comment)  Has personal trainer        Discharge Exercise Prescription (Final Exercise Prescription Changes):     Exercise Prescription Changes - 10/16/15 0700    Exercise Review   Progression --   Response to Exercise   Blood Pressure (Admit) 132/80 mmHg   Blood Pressure (Exercise) 144/80 mmHg   Blood Pressure (Exit) 114/65 mmHg   Heart Rate (Admit) 81 bpm   Heart Rate (Exercise) 104 bpm   Heart Rate (Exit) 85 bpm   Rating of Perceived Exertion (Exercise) 13   Symptoms None   Comments Patient is approaching graduation of the program and home exercise plans were discussed. Details of the patient's exercise prescription and what they need to do in order to continue the prescription and progress with exercise were outlined and the patient verbalized understanding. The patient plans to complete all exercise with his personal trainer 2d/wk and walk on the other 3 days.   Frequency Add 2 additional days to program exercise sessions.  Bernhardt is walking at home on days he doesn't come to rehab   Duration Progress to 30 minutes of continuous aerobic without signs/symptoms of physical distress   Intensity Rest + 30   Progression  Progression Continue progressive overload as per policy without signs/symptoms or physical distress.   Interval Training   Interval Training Yes   Equipment --  BioStep   Comments Level 4 for 3 minutes, Level 6 for 1 minute. Repeat for 10-15 minutes   Home Exercise Plan   Plans to continue exercise at [facility] (comment)  Has personal trainer      Nutrition:  Target Goals: Understanding of nutrition guidelines, daily intake of sodium '1500mg'$ , cholesterol '200mg'$ , calories 30% from fat and 7% or less from saturated fats, daily to have 5 or more servings of fruits and vegetables.  Biometrics:     Pre Biometrics - 07/02/15 1447    Pre Biometrics   Waist Circumference 43  inches   Hip Circumference 38 inches   Waist to Hip Ratio 1.13 %         Post Biometrics - 10/16/15 0703     Post  Biometrics   Height 5' 11.8" (1.824 m)   Weight 208 lb (94.348 kg)   Waist Circumference 44.5 inches   Hip Circumference 41 inches   Waist to Hip Ratio 1.09 %   BMI (Calculated) 28.4      Nutrition Therapy Plan and Nutrition Goals:     Nutrition Therapy & Goals - 07/02/15 1451    Nutrition Therapy   Drug/Food Interactions Statins/Certain Fruits   Personal Nutrition Goals   Personal Goal #1 Cont. to eat heart healthy and eat to control his diabetes.    Comments Nilesh reports that his wife already has him on a heart healthy and a diabetic eating plan. Fatih said he did not feel he needed to meet individually with the Cardiac Rehab Registered dietician.    Intervention Plan   Intervention (read-only) Using nutrition plan and personal goals to gain a healthy nutrition lifestyle. Add exercise as prescribed.      Nutrition Discharge: Rate Your Plate Scores:     Nutrition Assessments - 09/27/15 1725    Rate Your Plate Scores   Post Score 74   Post Score % 82.22 %      Nutrition Goals Re-Evaluation:     Nutrition Goals Re-Evaluation      08/29/15 1605 10/11/15 1650         Personal Goal #1 Re-Evaluation   Goal Progress Seen Yes Yes      Comments Lucile said that his wife had shoulder surgery 2 weeks ago and people are bringing apple pies and coconut pies to his house so it is hard to eat correctly at times but he says he usually eats healthy to control his blood sugars in the 100 range.  Layman said his blood sugars have been up to 150's since his wife had shoulder surgery and they have been eating out more. Jamesrobert reports the MD recently stopped his Diltiazem and increased his metoprolol from '50mg'$  three times a day to '100mg'$  twice a day which has really made him feel tired. He has a follow up appt with tthat MD on March 9th         Psychosocial: Target  Goals: Acknowledge presence or absence of depression, maximize coping skills, provide positive support system. Participant is able to verbalize types and ability to use techniques and skills needed for reducing stress and depression.  Initial Review & Psychosocial Screening:     Initial Psych Review & Screening - 07/02/15 [place]? Yes   Comments Naaman reports that his  wife feeds him good food based on a diabetic diet and now a heart diet.    Barriers   Psychosocial barriers to participate in program There are no identifiable barriers or psychosocial needs.   Screening Interventions   Interventions Encouraged to exercise      Quality of Life Scores:     Quality of Life - 09/21/15 1016    Quality of Life Scores   Health/Function Post 24.87 %   Health/Function % Change 21.9 %   Socioeconomic Post 25.36 %   Socioeconomic % Change -4 %   Psych/Spiritual Post 24.57 %   Psych/Spiritual % Change -9 %   Family Post 26.4 %   Family % Change -8.3 %  Devone's wife had shoulder surgery recently.    GLOBAL Post 25.13 %   GLOBAL % Change 3.7 %      PHQ-9:     Recent Review Flowsheet Data    Depression screen [facility] 2/9 09/21/2015 07/02/2015 06/21/2015   Decreased Interest 0 2 0   Down, Depressed, Hopeless 0 1 0   PHQ - 2 Score 0 3 0   Altered sleeping 0 0 -   Tired, decreased energy 0 2 -   Change in appetite 0 0 -   Feeling bad or failure about yourself  0 0 -   Trouble concentrating 0 0 -   Moving slowly or fidgety/restless 0 1 -   Suicidal thoughts 0 0 -   PHQ-9 Score 0 6 -   Difficult doing work/chores - Somewhat difficult -      Psychosocial Evaluation and Intervention:     Psychosocial Evaluation - 08/01/15 1658    Psychosocial Evaluation & Interventions   Comments Met with Mr. Todt today for psychosocial evaluation reporting triple bypass on 10/12.  He has a strong support system with a spouse of 15 years and (2) adult daughters  who live close by.  Mr. Jerilynn Mages has diabetes and sleep apnea - but is sleeping fine and his appetite has returned recently.  He denies a history of depression or anxiety or curent symptoms.  He is typically in a positive mood and reports minimal stress in his life currently.  Mr. Jerilynn Mages has goals to continue exercising without being tired, and to lose some more weight while in this program.  His spouse has reported positive benefits already seen since he started this program stating he has "more stamina now  than he has had in the past few years."  Counselor commended Mr. Jerilynn Mages for his progress and his commitment to consistent exercise.       Psychosocial Re-Evaluation:     Psychosocial Re-Evaluation      08/29/15 1608 10/11/15 1651         Psychosocial Re-Evaluation   Interventions Encouraged to attend Cardiac Rehabilitation for the exercise       Comments Jose has been having to take care of his wife who had shoulder surgery. People are brining food in but no always the healthiest.  Wali said his wife is doing better so he is doing better although he is tired due to his metoprolo change.          Vocational Rehabilitation: Provide vocational rehab assistance to qualifying candidates.   Vocational Rehab Evaluation & Intervention:     Vocational Rehab - 07/02/15 1350    Initial Vocational Rehab Evaluation & Intervention   Assessment shows need for Vocational Rehabilitation No      Education: Education Goals: Education classes  will be provided on a weekly basis, covering required topics. Participant will state understanding/return demonstration of topics presented.  Learning Barriers/Preferences:   Education Topics: General Nutrition Guidelines/Fats and Fiber: -Group instruction provided by verbal, written material, models and posters to present the general guidelines for heart healthy nutrition. Gives an explanation and review of dietary fats and fiber.          Cardiac Rehab from  09/05/2015 in [facility] Cardiac and Pulmonary Rehab   Date  07/16/15   Educator  PI   Instruction Review Code  2- meets goals/outcomes      Controlling Sodium/Reading Food Labels: -Group verbal and written material supporting the discussion of sodium use in heart healthy nutrition. Review and explanation with models, verbal and written materials for utilization of the food label.      Cardiac Rehab from 09/05/2015 in [facility] Cardiac and Pulmonary Rehab   Date  07/23/15   Educator  PI   Instruction Review Code  2- meets goals/outcomes      Exercise Physiology & Risk Factors: - Group verbal and written instruction with models to review the exercise physiology of the cardiovascular system and associated critical values. Details cardiovascular disease risk factors and the goals associated with each risk factor.   Aerobic Exercise & Resistance Training: - Gives group verbal and written discussion on the health impact of inactivity. On the components of aerobic and resistive training programs and the benefits of this training and how to safely progress through these programs.      Cardiac Rehab from 09/05/2015 in [facility] Cardiac and Pulmonary Rehab   Date  08/27/15   Educator  RM   Instruction Review Code  2- meets goals/outcomes      Flexibility, Balance, General Exercise Guidelines: - Provides group verbal and written instruction on the benefits of flexibility and balance training programs. Provides general exercise guidelines with specific guidelines to those with heart or lung disease. Demonstration and skill practice provided.      Cardiac Rehab from 09/05/2015 in [facility] Cardiac and Pulmonary Rehab   Date  08/27/15   Educator  RM   Instruction Review Code  2- meets goals/outcomes      Stress Management: - Provides group verbal and written instruction about the health risks of elevated stress, cause of high stress, and healthy ways to reduce stress.   Depression: - Provides group verbal and  written instruction on the correlation between heart/lung disease and depressed mood, treatment options, and the stigmas associated with seeking treatment.      Cardiac Rehab from 09/05/2015 in [facility] Cardiac and Pulmonary Rehab   Date  07/25/15   Educator  Olegario Messier C   Instruction Review Code  2- meets goals/outcomes      Anatomy & Physiology of the Heart: - Group verbal and written instruction and models provide basic cardiac anatomy and physiology, with the coronary electrical and arterial systems. Review of: AMI, Angina, Valve disease, Heart Failure, Cardiac Arrhythmia, Pacemakers, and the ICD.      Cardiac Rehab from 09/05/2015 in [facility] Cardiac and Pulmonary Rehab   Date  09/03/15   Educator  SB   Instruction Review Code  2- meets goals/outcomes      Cardiac Procedures: - Group verbal and written instruction and models to describe the testing methods done to diagnose heart disease. Reviews the outcomes of the test results. Describes the treatment choices: Medical Management, Angioplasty, or Coronary Bypass Surgery.   Cardiac Medications: - Group verbal and written instruction to  review commonly prescribed medications for heart disease. Reviews the medication, class of the drug, and side effects. Includes the steps to properly store meds and maintain the prescription regimen.   Go Sex-Intimacy & Heart Disease, Get SMART - Goal Setting: - Group verbal and written instruction through game format to discuss heart disease and the return to sexual intimacy. Provides group verbal and written material to discuss and apply goal setting through the application of the S.M.A.R.T. Method.   Other Matters of the Heart: - Provides group verbal, written materials and models to describe Heart Failure, Angina, Valve Disease, and Diabetes in the realm of heart disease. Includes description of the disease process and treatment options available to the cardiac patient.      Cardiac Rehab from 09/05/2015 in  [facility] Cardiac and Pulmonary Rehab   Date  07/11/15   Educator  DW   Instruction Review Code  2- meets goals/outcomes      Exercise & Equipment Safety: - Individual verbal instruction and demonstration of equipment use and safety with use of the equipment.      Cardiac Rehab from 09/05/2015 in [facility] Cardiac and Pulmonary Rehab   Date  07/02/15   Educator  C. EnterkinRN   Instruction Review Code  1- partially meets, needs review/practice      Infection Prevention: - Provides verbal and written material to individual with discussion of infection control including proper hand washing and proper equipment cleaning during exercise session.      Cardiac Rehab from 09/05/2015 in [facility] Cardiac and Pulmonary Rehab   Date  07/02/15   Educator  C. EnterkinRN   Instruction Review Code  1- partially meets, needs review/practice      Falls Prevention: - Provides verbal and written material to individual with discussion of falls prevention and safety.      Cardiac Rehab from 09/05/2015 in [facility] Cardiac and Pulmonary Rehab   Date  07/02/15   Educator  C. EnterkinRN   Instruction Review Code  2- meets goals/outcomes      Diabetes: - Individual verbal and written instruction to review signs/symptoms of diabetes, desired ranges of glucose level fasting, after meals and with exercise. Advice that pre and post exercise glucose checks will be done for 3 sessions at entry of program.      Cardiac Rehab from 09/05/2015 in [facility] Cardiac and Pulmonary Rehab   Date  07/02/15   Educator  C. EnterkinRN   Instruction Review Code  2- meets goals/outcomes       Knowledge Questionnaire Score:     Knowledge Questionnaire Score - 09/21/15 1020    Knowledge Questionnaire Score   Post Score 25      Personal Goals and Risk Factors at Admission:     Personal Goals and Risk Factors at Admission - 07/02/15 1447    Core Components/Risk Factors/Patient Goals on Admission   Sedentary Yes   Intervention  (read-only) While in program, learn and follow the exercise prescription taught. Start at a low level workload and increase workload after able to maintain previous level for 30 minutes. Increase time before increasing intensity.  Goal time to achieve goal by 36 sessions.    Diabetes Yes   Goal Blood glucose control identified by blood glucose values, HgbA1C. Participant verbalizes understanding of the signs/symptoms of hyper/hypo glycemia, proper foot care and importance of medication and nutrition plan for blood glucose control.   Intervention (read-only) Provide nutrition & aerobic exercise along with prescribed medications to achieve blood glucose in  normal ranges: Fasting 65-99 mg/dL   Hypertension Yes   Goal Participant will see blood pressure controlled within the values of 140/6m/Hg or within value directed by their physician.   Intervention (read-only) Provide nutrition & aerobic exercise along with prescribed medications to achieve BP 140/90 or less.   Lipids Yes   Goal Cholesterol controlled with medications as prescribed, with individualized exercise RX and with personalized nutrition plan. Value goals: LDL < '70mg'$ , HDL > '40mg'$ . Participant states understanding of desired cholesterol values and following prescriptions.   Intervention (read-only) Provide nutrition & aerobic exercise along with prescribed medications to achieve LDL '70mg'$ , HDL >'40mg'$ .      Personal Goals and Risk Factors Review:      Goals and Risk Factor Review      07/09/15 1737 07/25/15 1737 08/27/15 1151 08/29/15 1606 09/12/15 1658   Core Components/Risk Factors/Patient Goals Review   Personal Goals Review Increase Aerobic Exercise and Physical Activity Diabetes;Hypertension;Lipids      Increase Aerobic Exercise and Physical Activity (read-only)   Goals Progress/Improvement seen  No  No     Comments Today was first exsecise session.  Spoke with GFatehabout his overall goals and short term goal ssetting.  He does  want to return to his golf game. He states that will be awhile before that happens. Also he wants to return to exercising 30 minutes 3 times a day and doing weights. Short term goal is to attend class 3 days a week and follow the exerciseprescription that he is given.   GLancehas been absent since 08/09/15. His exercise goals and rx. will need to be re-evaluated based on his current PA level.      Diabetes (read-only)   Goal     Blood glucose control identified by blood glucose values, HgbA1C. Participant verbalizes understanding of the signs/symptoms of hyper/hypo glycemia, proper foot care and importance of medication and nutrition plan for blood glucose control.   Progress seen towards goals  Yes  Yes Yes   Comments  GMarcia states he is changing his eating habits more fruit and vegs, less fried foods, decreased his starches, added more beans/legumes.  GJhansaid the past 2 weeks have been tough since his wife had shoulder surgery and she usually cooks healthy for him. He asked about our cafeteria and I said that they have green dots for healthy food.  Recent HgbA1C 7.2%    GRobcontinues to work on eating habits and exercise. Limited some currently because his wife had surgery and is not cooking the "healthy food".   Hypertension (read-only)   Goal     Participant will see blood pressure controlled within the values of 140/963mHg or within value directed by their physician.   Progress seen toward goals  Yes  Yes    Comments  GeEsterstates he is changing his eating habits more fruit and vegs, less fried foods, decreased his starches, added more beans/legumes. He is keeping an eye on his sodium intake.  Blood pressure has been stable -see telemetry sheets.  BP remains in good range when in class. Geroge reported it was elevated in MD office today.  He has a med change and a new med prescribed.   Abnormal Lipids (read-only)   Progress seen towards goals  Yes  Yes    Comments  GeBernhardstates he is  changing his eating habits more fruit and vegs, less fried foods, decreased his starches, added more beans/legumes. He is progressing with his  exercise program  Altan Kraai has been trying to eat more fruits andvegetables even when they go out to eat.       09/12/15 1701 10/11/15 1650 10/18/15 1731       Core Components/Risk Factors/Patient Goals Review   Personal Goals Review  Diabetes;Hypertension;Lipids Sedentary     Review  Percival said his blood sugars have been up to 150's since his wife had shoulder surgery and they have been eating out more. Janari reports the MD recently stopped his Diltiazem and increased his metoprolol from '50mg'$  three times a day to '100mg'$  twice a day which has really made him feel tired. He has a follow up appt with tthat MD on March 9th Jeovany completed 36 sessions of Cardiac Rehab.      Expected Outcomes  Yahsir feels sure with his wife cooking more soon his blood sugars will be back in the 90s like his normal.       Abnormal Lipids (read-only)   Comments COntinues to work on exercise and eating the right foods amd amounts          Personal Goals Discharge (Final Personal Goals and Risk Factors Review):      Goals and Risk Factor Review - 10/18/15 1731    Core Components/Risk Factors/Patient Goals Review   Personal Goals Review Sedentary   Review Quadir completed 36 sessions of Cardiac Rehab.       ITP Comments:     ITP Comments      07/17/15 1318 08/12/15 1408 09/07/15 1227 09/19/15 1809 09/21/15 1021   ITP Comments 30 day review preparation  Continue with ITP   New to program only a few visits completed Ready for 30 day review.  Continue with ITP Ready fro 30 day review, Continue with ITP.  Gautam reported his MD has changed some of his medications.  He is no longer on Diltiazem.  Metoprolol has been increased to '100mg'$  twice daily.  Losartan-HCTZ 50-12.5 mg is one tablet once a day.  Plavix 75 mg once a day.  These changes have already been made to his  medication list.  See Medication List for Current Medications.   Blaire's wife had shoulder surgery so he has been having to help take care of her also.      10/02/15 0714 10/11/15 1648         ITP Comments 30 day review.  Continue with ITP Jerrold said his blood sugars have been up to 150's since his wife had shoulder surgery and they have been eating out more. Deaven reports the MD recently stopped his Diltiazem and increased his metoprolol from '50mg'$  three times a day to '100mg'$  twice a day which has really made him feel tired. He has a follow up appt with tthat MD on March 9th.          Comments:

## 2015-10-18 NOTE — Progress Notes (Signed)
Cardiology Office Note   Date:  10/18/2015   ID:  Bradley [name], DOB 11-29-1945, MRN [id number]  PCP:   Melinda Crutch, MD  Cardiologist:  Sinclair Grooms, MD   Chief Complaint  Patient presents with  . Coronary Artery Disease      History of Present Illness: Bradley [name] is a 70 y.o. male who presents for Follow-up of CAD with recent CABG, postoperative atrial fibrillation, hypertension, chronic diastolic left ventricular dysfunction, and nonocclusive carotid disease.  Bradley [name] has completed cardiac rehabilitation. Recent medication adjustments have been tolerated with some difficulty. The higher dose of beta blocker causes fatigue. He's had no significant palpitations or tachycardia. He denies syncope. He did have postoperative atrial fibrillation, but a 30 day monitor post surgery did not reveal evidence of recurrent atrial fibrillation.    Past Medical History  Diagnosis Date  . Hyperlipidemia   . Hypertension   . Diabetes mellitus age 61  . Peripheral vascular disease ([place])   . Arthritis   . Reflux esophagitis   . Stroke [facility]) 1987    Right brain stroke  . Neuropathy ([place]) 2013  . Osteoporosis 2013  . Sleep apnea     uses CPAP  . GERD (gastroesophageal reflux disease)   . H/O hiatal hernia   . Irritable bowel syndrome (IBS)   . Myocardial infarction [facility])     silent inferior MI; patient denies MI history (03/17/13)   . Anemia   . Carotid artery occlusion     a. Carotid US 10/16: RICA 60-79%, L CEA patent with 1-39% stenosis  . Coronary artery disease     a. Myoview 9/16:  EF 52%, inferior fixed defect consistent with diaphragmatic attenuation, intermediate risk secondary to poor exercise tolerance and symptoms during stress;  b. LHC 05/17/2015 90% mid LAD, 80% ost D2, 75% mid RCA, 35% prox RCA. >> S/p CABG  . Diverticulitis   . Wears glasses   . PAF (paroxysmal atrial fibrillation) ([place])     post CABG; Amiodarone stopped 2/2 wheezing;   . History of  echocardiogram     a. Echo 9/16: GLS -15.2%, EF 0000000, grade 1 diastolic dysfunction, normal wall motion, aortic sclerosis, dilated aortic root 39 mm, atrial septal lipomatous hypertrophy    Past Surgical History  Procedure Laterality Date  . Carotid endarterectomy  1994 & redo 2001    Left  . Pr vein bypass graft,aorto-fem-pop  1998  . Iliac artery stent    . Fracture surgery  2013    Right   foo  t X's 2  . Tonsillectomy    . Posterior lumbar fusion  Aug. 14, 2014    Level 1  . Spine surgery  2014  . Cardiac catheterization N/A 05/17/2015    Procedure: Left Heart Cath and Coronary Angiography;  Surgeon: Belva Crome, MD;  Location: [place] CV [facility];  Service: Cardiovascular;  Laterality: N/A;  . Colonoscopy w/ biopsies and polypectomy    . Coronary artery bypass graft N/A 05/23/2015    Procedure: CORONARY ARTERY BYPASS GRAFTING (CABG) x 3 (LIMA to LAD, SVG to DIAGONAL 2, SVG to PDA) with Endoscopic Vein Harvesting from right greater saphenous vein;  Surgeon: Grace Isaac, MD;  Location: [place];  Service: Open Heart Surgery;  Laterality: N/A;  . Tee without cardioversion N/A 05/23/2015    Procedure: TRANSESOPHAGEAL ECHOCARDIOGRAM (TEE);  Surgeon: Grace Isaac, MD;  Location: [place];  Service: Open Heart Surgery;  Laterality: N/A;  Current Outpatient Prescriptions  Medication Sig Dispense Refill  . Ascorbic Acid (VITAMIN C) 100 MG tablet Take 100 mg by mouth daily.    Marland Kitchen aspirin EC 81 MG EC tablet Take 1 tablet (81 mg total) by mouth daily.    Marland Kitchen atorvastatin (LIPITOR) 40 MG tablet Take 40 mg by mouth daily.      . Calcium Carbonate-Vitamin D (CALCIUM-D) 600-400 MG-UNIT TABS Take 1 tablet by mouth daily.     . Cholecalciferol (D-3-5) 5000 UNITS capsule Take 5,000 Units by mouth daily.    . clopidogrel (PLAVIX) 75 MG tablet Take 1 tablet (75 mg total) by mouth daily. 1 tablet 0  . dexlansoprazole (DEXILANT) 60 MG capsule Take 60 mg by mouth daily.    Marland Kitchen glimepiride  (AMARYL) 2 MG tablet Take 2 mg by mouth daily before breakfast.      . Hydrocortisone Acetate (PROCTOSOL RE) Place rectally.    . IRON, FERROUS GLUCONATE, PO Take 40 mg by mouth 2 (two) times daily.    . Liraglutide (VICTOZA [place]) Inject 1.8 mg into the skin daily.     Marland Kitchen losartan-hydrochlorothiazide (HYZAAR) 50-12.5 MG tablet Take 1 tablet by mouth daily. 90 tablet 3  . metFORMIN (GLUCOPHAGE-XR) 500 MG 24 hr tablet Take 2 tablets (1,000 mg total) by mouth 2 (two) times daily.    . metoprolol (LOPRESSOR) 100 MG tablet Take 1 tablet (100 mg total) by mouth 2 (two) times daily. 180 tablet 3  . Multiple Vitamin (MULTIVITAMIN) capsule Take 1 capsule by mouth daily.      . traMADol (ULTRAM) 50 MG tablet Take 1 tablet (50 mg total) by mouth every 6 (six) hours as needed for moderate pain. 30 tablet 0   No current facility-administered medications for this visit.    Allergies:   Hydrocodone; Hydromorphone; Oxycodone; Penicillins; and Sulfa drugs cross reactors    Social History:  The patient  reports that he quit smoking about 6 years ago. His smoking use included Cigarettes. He has a 80 pack-year smoking history. He has never used smokeless tobacco. He reports that he does not drink alcohol or use illicit drugs.   Family History:  The patient's family history includes Cancer (age of onset: 44) in his mother; Deep vein thrombosis in his father; Hyperlipidemia in his father and mother; Hypertension in his father; Stroke (age of onset: 43) in his father.    ROS:  Please see the history of present illness.   Otherwise, review of systems are positive for Excessive fatigue which he relates to the higher beta blocker dose.   All other systems are reviewed and negative.    PHYSICAL EXAM: VS:  BP 138/80 mmHg  Pulse 72  Ht 6' (1.829 m)  Wt 206 lb (93.441 kg)  BMI 27.93 kg/m2 , BMI Body mass index is 27.93 kg/(m^2). GEN: Well nourished, well developed, in no acute distress HEENT: normal Neck: no JVD,  carotid bruits, or masses Cardiac: RRR.  There is no murmur, rub, or gallop. There is no edema. Respiratory:  clear to auscultation bilaterally, normal work of breathing. GI: soft, nontender, nondistended, + BS MS: no deformity or atrophy Skin: warm and dry, no rash Neuro:  Strength and sensation are intact Psych: euthymic mood, full affect   EKG:  EKG is not ordered today.    Recent Labs: 04/30/2015: Pro B Natriuretic peptide (BNP) 6.0 05/24/2015: Magnesium 1.9 05/26/2015: TSH 2.630 05/30/2015: ALT 18 06/13/2015: Hemoglobin 11.3*; Platelets 429* 09/19/2015: BUN 20; Creat 1.76*; Potassium 4.2; Sodium  137    Lipid Panel    Component Value Date/Time   CHOL 82 06/01/2015 0315   TRIG 158* 06/01/2015 0315   HDL 24* 06/01/2015 0315   CHOLHDL 3.4 06/01/2015 0315   VLDL 32 06/01/2015 0315   LDLCALC 26 06/01/2015 0315      Wt Readings from Last 3 Encounters:  10/18/15 206 lb (93.441 kg)  10/16/15 208 lb (94.348 kg)  09/12/15 201 lb 12.8 oz (91.536 kg)      Other studies Reviewed: Additional studies/ records that were reviewed today include: Reviewed cardiac rehabilitation data. No evidence of atrial fibrillation. Blood pressures and vital signs have been stable.. .    ASSESSMENT AND PLAN:  1. Coronary artery disease involving native coronary artery of native heart with other form of angina pectoris ([place]) Asymptomatic status post bypass surgery.  2. Atrial fibrillation with rapid ventricular response ([name]) No documentation of recurrences.  3. Peripheral vascular disease ([place]) No limitations.  4. Right bundle branch block Not reassessed.  5. Stenosis of right internal carotid artery with cerebral infarction ([place]) Asymptomatic.   Current medicines are reviewed at length with the patient today.  The patient has the following concerns regarding medicines: Fatigue on beta blocker therapy.  The following changes/actions have been instituted:    Continue to monitor on  beta blocker therapy. If after 3 months he continues to have excessive fatigue and complaints, we will transition to a different dose or a different agent.  Resume progressive usual activities including golf  Call if palpitations or sustained elevation in heart rate  Labs/ tests ordered today include:  No orders of the defined types were placed in this encounter.     Disposition:   FU with HS in 6 months  Signed, Sinclair Grooms, MD  10/18/2015 10:32 AM    [facility] [address], [place], [place]  [address] Phone: [phone]; Fax: [phone]

## 2015-10-18 NOTE — Patient Instructions (Signed)
Medication Instructions:  Your physician recommends that you continue on your current medications as directed. Please refer to the Current Medication list given to you today.   Labwork: None ordered  Testing/Procedures: None ordered  Follow-Up: Your physician recommends that you schedule a follow-up appointment in: 3 months   Any Other Special Instructions Will Be Listed Below (If Applicable).     If you need a refill on your cardiac medications before your next appointment, please call your pharmacy.

## 2015-10-18 NOTE — Addendum Note (Signed)
Addended by: Gerlene Burdock on: 10/18/2015 05:34 PM   Modules accepted: Orders

## 2015-10-18 NOTE — Progress Notes (Signed)
Discharge Summary  Patient Details  Name: Bradley [name] MRN: [id number] Date of Birth: 1945-10-09 Referring Provider:  Lona Kettle, MD   Number of Visits: 36  Reason for Discharge:  Patient reached a stable level of exercise. Patient independent in their exercise.  Smoking History:  History  Smoking status  . Former Smoker -- 2.00 packs/day for 40 years  . Types: Cigarettes  . Quit date: 03/26/2009  Smokeless tobacco  . Never Used    Diagnosis:  S/P CABG x 3  ADL UCSD:   Initial Exercise Prescription:     Initial Exercise Prescription - 07/02/15 1500    Date of Initial Exercise Prescription   Date 07/02/15   Treadmill   MPH 2.5   Grade 0   Minutes 15   Bike   Level 1   Minutes 15   Recumbant Bike   Level 2   Watts 30   Minutes 15   NuStep   Level 3   Watts 40   Minutes 15   Arm Ergometer   Level 1   Watts 10   Minutes 15   Arm/Foot Ergometer   Level 1   Watts 12   Minutes 15   Cybex   Level 2   RPM 40   Minutes 15   Recumbant Elliptical   Level 1   Watts 20   Minutes 15   Elliptical   Level 1   Speed 2.5   Minutes 5   REL-XR   Level 3   Watts 30   Minutes 15   T5 Nustep   Level 2   Watts 20   Minutes 15   Biostep-RELP   Level 3   Watts 30   Minutes 15   Prescription Details   Frequency (times per week) 3   Duration Progress to 30 minutes of continuous aerobic without signs/symptoms of physical distress   Intensity   THRR REST +  30   Ratings of Perceived Exertion 11-15   Perceived Dyspnea 2-4   Progression   Progression Continue progressive overload as per policy without signs/symptoms or physical distress.   Resistance Training   Training Prescription Yes   Weight 2   Reps 10-15      Discharge Exercise Prescription (Final Exercise Prescription Changes):     Exercise Prescription Changes - 10/16/15 0700    Exercise Review   Progression --   Response to Exercise   Blood Pressure (Admit) 132/80 mmHg   Blood  Pressure (Exercise) 144/80 mmHg   Blood Pressure (Exit) 114/65 mmHg   Heart Rate (Admit) 81 bpm   Heart Rate (Exercise) 104 bpm   Heart Rate (Exit) 85 bpm   Rating of Perceived Exertion (Exercise) 13   Symptoms None   Comments Patient is approaching graduation of the program and home exercise plans were discussed. Details of the patient's exercise prescription and what they need to do in order to continue the prescription and progress with exercise were outlined and the patient verbalized understanding. The patient plans to complete all exercise with his personal trainer 2d/wk and walk on the other 3 days.   Frequency Add 2 additional days to program exercise sessions.  Bradley [name] is walking at home on days he doesn't come to rehab   Duration Progress to 30 minutes of continuous aerobic without signs/symptoms of physical distress   Intensity Rest + 30   Progression   Progression Continue progressive overload as per policy without signs/symptoms or physical distress.  Interval Training   Interval Training Yes   Equipment --  BioStep   Comments Level 4 for 3 minutes, Level 6 for 1 minute. Repeat for 10-15 minutes   Home Exercise Plan   Plans to continue exercise at [facility] (comment)  Has personal trainer      Functional Capacity:     6 Minute Walk      07/02/15 1444 10/16/15 0703     6 Minute Walk   Phase Initial Discharge    Distance 1175 feet 1450 feet    Distance % Change  23 %    Walk Time 6 minutes 6 minutes    # of Rest Breaks  0    RPE 12 12    Perceived Dyspnea  0     Symptoms No No    Resting HR 98 bpm 86 bpm    Resting BP 138/62 mmHg 132/70 mmHg    Max Ex. HR 102 bpm 100 bpm    Max Ex. BP 140/78 mmHg 146/70 mmHg       Psychological, QOL, Others - Outcomes: PHQ 2/9: Depression screen [name] 2/9 09/21/2015 07/02/2015 06/21/2015  Decreased Interest 0 2 0  Down, Depressed, Hopeless 0 1 0  PHQ - 2 Score 0 3 0  Altered sleeping 0 0 -  Tired, decreased  energy 0 2 -  Change in appetite 0 0 -  Feeling bad or failure about yourself  0 0 -  Trouble concentrating 0 0 -  Moving slowly or fidgety/restless 0 1 -  Suicidal thoughts 0 0 -  PHQ-9 Score 0 6 -  Difficult doing work/chores - Somewhat difficult -    Quality of Life:     Quality of Life - 09/21/15 1016    Quality of Life Scores   Health/Function Post 24.87 %   Health/Function % Change 21.9 %   Socioeconomic Post 25.36 %   Socioeconomic % Change -4 %   Psych/Spiritual Post 24.57 %   Psych/Spiritual % Change -9 %   Family Post 26.4 %   Family % Change -8.3 %  Bradley [name]'s wife had shoulder surgery recently.    GLOBAL Post 25.13 %   GLOBAL % Change 3.7 %      Personal Goals: Goals established at orientation with interventions provided to work toward goal.     Personal Goals and Risk Factors at Admission - 07/02/15 1447    Core Components/Risk Factors/Patient Goals on Admission   Sedentary Yes   Intervention (read-only) While in program, learn and follow the exercise prescription taught. Start at a low level workload and increase workload after able to maintain previous level for 30 minutes. Increase time before increasing intensity.  Goal time to achieve goal by 36 sessions.    Diabetes Yes   Goal Blood glucose control identified by blood glucose values, HgbA1C. Participant verbalizes understanding of the signs/symptoms of hyper/hypo glycemia, proper foot care and importance of medication and nutrition plan for blood glucose control.   Intervention (read-only) Provide nutrition & aerobic exercise along with prescribed medications to achieve blood glucose in normal ranges: Fasting 65-99 mg/dL   Hypertension Yes   Goal Participant will see blood pressure controlled within the values of 140/62mm/Hg or within value directed by their physician.   Intervention (read-only) Provide nutrition & aerobic exercise along with prescribed medications to achieve BP 140/90 or less.   Lipids  Yes   Goal Cholesterol controlled with medications as prescribed, with individualized exercise RX and with personalized nutrition  plan. Value goals: LDL < 70mg , HDL > 40mg . Participant states understanding of desired cholesterol values and following prescriptions.   Intervention (read-only) Provide nutrition & aerobic exercise along with prescribed medications to achieve LDL 70mg , HDL >40mg .       Personal Goals Discharge:     Goals and Risk Factor Review      07/09/15 1737 07/25/15 1737 08/27/15 1151 08/29/15 1606 09/12/15 1658   Core Components/Risk Factors/Patient Goals Review   Personal Goals Review Increase Aerobic Exercise and Physical Activity Diabetes;Hypertension;Lipids      Increase Aerobic Exercise and Physical Activity (read-only)   Goals Progress/Improvement seen  No  No     Comments Today was first exsecise session.  Spoke with Momin about his overall goals and short term goal ssetting.  He does want to return to his golf game. He states that will be awhile before that happens. Also he wants to return to exercising 30 minutes 3 times a day and doing weights. Short term goal is to attend class 3 days a week and follow the exerciseprescription that he is given.   Winton has been absent since 08/09/15. His exercise goals and rx. will need to be re-evaluated based on his current PA level.      Diabetes (read-only)   Goal     Blood glucose control identified by blood glucose values, HgbA1C. Participant verbalizes understanding of the signs/symptoms of hyper/hypo glycemia, proper foot care and importance of medication and nutrition plan for blood glucose control.   Progress seen towards goals  Yes  Yes Yes   Comments  Nii  states he is changing his eating habits more fruit and vegs, less fried foods, decreased his starches, added more beans/legumes.  Doward said the past 2 weeks have been tough since his wife had shoulder surgery and she usually cooks healthy for him. He asked about  our cafeteria and I said that they have green dots for healthy food.  Recent HgbA1C 7.2%    Elster continues to work on eating habits and exercise. Limited some currently because his wife had surgery and is not cooking the "healthy food".   Hypertension (read-only)   Goal     Participant will see blood pressure controlled within the values of 140/23mm/Hg or within value directed by their physician.   Progress seen toward goals  Yes  Yes    Comments  Limuel  states he is changing his eating habits more fruit and vegs, less fried foods, decreased his starches, added more beans/legumes. He is keeping an eye on his sodium intake.  Blood pressure has been stable -see telemetry sheets.  BP remains in good range when in class. Geroge reported it was elevated in MD office today.  He has a med change and a new med prescribed.   Abnormal Lipids (read-only)   Progress seen towards goals  Yes  Yes    Comments  Reyna  states he is changing his eating habits more fruit and vegs, less fried foods, decreased his starches, added more beans/legumes. He is progressing with his exercise program  [place] has been trying to eat more fruits andvegetables even when they go out to eat.       09/12/15 1701 10/11/15 1650 10/18/15 1731       Core Components/Risk Factors/Patient Goals Review   Personal Goals Review  Diabetes;Hypertension;Lipids Sedentary     Review  Mar said his blood sugars have been up to 150's since his wife had shoulder surgery and  they have been eating out more. Keven reports the MD recently stopped his Diltiazem and increased his metoprolol from 50mg  three times a day to 100mg  twice a day which has really made him feel tired. He has a follow up appt with tthat MD on March 9th Nikolay completed 36 sessions of Cardiac Rehab.      Expected Outcomes  Mckale feels sure with his wife cooking more soon his blood sugars will be back in the 90s like his normal.       Abnormal Lipids (read-only)    Comments COntinues to work on exercise and eating the right foods amd amounts          Nutrition & Weight - Outcomes:     Pre Biometrics - 07/02/15 1447    Pre Biometrics   Waist Circumference 43 inches   Hip Circumference 38 inches   Waist to Hip Ratio 1.13 %         Post Biometrics - 10/16/15 0703     Post  Biometrics   Height 5' 11.8" (1.824 m)   Weight 208 lb (94.348 kg)   Waist Circumference 44.5 inches   Hip Circumference 41 inches   Waist to Hip Ratio 1.09 %   BMI (Calculated) 28.4      Nutrition:     Nutrition Therapy & Goals - 07/02/15 1451    Nutrition Therapy   Drug/Food Interactions Statins/Certain Fruits   Personal Nutrition Goals   Personal Goal #1 Cont. to eat heart healthy and eat to control his diabetes.    Comments Mumin reports that his wife already has him on a heart healthy and a diabetic eating plan. Chanden said he did not feel he needed to meet individually with the Cardiac Rehab Registered dietician.    Intervention Plan   Intervention (read-only) Using nutrition plan and personal goals to gain a healthy nutrition lifestyle. Add exercise as prescribed.      Nutrition Discharge:     Nutrition Assessments - 09/27/15 1725    Rate Your Plate Scores   Post Score 74   Post Score % 82.22 %      Education Questionnaire Score:     Knowledge Questionnaire Score - 09/21/15 1020    Knowledge Questionnaire Score   Post Score 25      Goals reviewed with patient; copy given to patient.

## 2015-10-23 ENCOUNTER — Ambulatory Visit: Payer: PPO | Admitting: Interventional Cardiology

## 2015-12-14 DIAGNOSIS — L570 Actinic keratosis: Secondary | ICD-10-CM | POA: Diagnosis not present

## 2015-12-14 DIAGNOSIS — Z85828 Personal history of other malignant neoplasm of skin: Secondary | ICD-10-CM | POA: Diagnosis not present

## 2015-12-14 DIAGNOSIS — D485 Neoplasm of uncertain behavior of skin: Secondary | ICD-10-CM | POA: Diagnosis not present

## 2015-12-14 DIAGNOSIS — D0462 Carcinoma in situ of skin of left upper limb, including shoulder: Secondary | ICD-10-CM | POA: Diagnosis not present

## 2015-12-14 DIAGNOSIS — X32XXXA Exposure to sunlight, initial encounter: Secondary | ICD-10-CM | POA: Diagnosis not present

## 2015-12-14 DIAGNOSIS — Z08 Encounter for follow-up examination after completed treatment for malignant neoplasm: Secondary | ICD-10-CM | POA: Diagnosis not present

## 2016-01-11 DIAGNOSIS — D0462 Carcinoma in situ of skin of left upper limb, including shoulder: Secondary | ICD-10-CM | POA: Diagnosis not present

## 2016-01-30 ENCOUNTER — Ambulatory Visit (INDEPENDENT_AMBULATORY_CARE_PROVIDER_SITE_OTHER): Payer: PPO | Admitting: Interventional Cardiology

## 2016-01-30 ENCOUNTER — Encounter: Payer: Self-pay | Admitting: Interventional Cardiology

## 2016-01-30 VITALS — BP 142/84 | HR 78 | Ht 72.0 in | Wt 210.1 lb

## 2016-01-30 DIAGNOSIS — I10 Essential (primary) hypertension: Secondary | ICD-10-CM

## 2016-01-30 DIAGNOSIS — I25118 Atherosclerotic heart disease of native coronary artery with other forms of angina pectoris: Secondary | ICD-10-CM | POA: Diagnosis not present

## 2016-01-30 DIAGNOSIS — I4891 Unspecified atrial fibrillation: Secondary | ICD-10-CM | POA: Diagnosis not present

## 2016-01-30 DIAGNOSIS — E785 Hyperlipidemia, unspecified: Secondary | ICD-10-CM | POA: Diagnosis not present

## 2016-01-30 DIAGNOSIS — I2510 Atherosclerotic heart disease of native coronary artery without angina pectoris: Secondary | ICD-10-CM

## 2016-01-30 MED ORDER — METOPROLOL TARTRATE 100 MG PO TABS: 50.0000 mg | ORAL_TABLET | Freq: Two times a day (BID) | ORAL | Status: DC

## 2016-01-30 NOTE — Patient Instructions (Signed)
Medication Instructions:   DECREASE METOPROLOL TO 50 MG TWICE DAILY= 1/2 OF THE 100 MG TABLET TWICE DAILY  Follow-Up:  Your physician wants you to follow-up in: [place] will receive a reminder letter in the mail two months in advance. If you don't receive a letter, please call our office to schedule the follow-up appointment.   MONITOR BLOOD PRESSURE AND LET us KNOW IF CONSISTENTLY GREATER THAN 150/90  CALL AND REPORT OF [facility]IGUE DOES NOT IMPROVE AFTER MEDICATION CHANGE

## 2016-01-30 NOTE — Progress Notes (Signed)
Cardiology Office Note    Date:  01/30/2016   ID:  Bradley [name], DOB [birth year], MRN [id number]  PCP:   Melinda Crutch, MD  Cardiologist: Sinclair Grooms, MD   Chief Complaint  Patient presents with  . Coronary Artery Disease    History of Present Illness:  Bradley [name] is a 70 y.o. male who has CAD, previous coronary bypass surgery, hypertension, hyperlipidemia, PAD, and diabetes mellitus.  Overall the patient is doing relatively well. States that he feels weak and tired all the time. He has not had syncope. He denies angina. No palpitations. Overall he feels well other than the issue with excessive fatigue. Wife states that he does not snore. He wears C Pap at night.  Past Medical History  Diagnosis Date  . Hyperlipidemia   . Hypertension   . Diabetes mellitus age 28  . Peripheral vascular disease ([place])   . Arthritis   . Reflux esophagitis   . Stroke [facility]) 1987    Right brain stroke  . Neuropathy ([place]) 2013  . Osteoporosis 2013  . Sleep apnea     uses CPAP  . GERD (gastroesophageal reflux disease)   . H/O hiatal hernia   . Irritable bowel syndrome (IBS)   . Myocardial infarction [facility])     silent inferior MI; patient denies MI history (03/17/13)   . Anemia   . Carotid artery occlusion     a. Carotid US 10/16: RICA 60-79%, L CEA patent with 1-39% stenosis  . Coronary artery disease     a. Myoview 9/16:  EF 52%, inferior fixed defect consistent with diaphragmatic attenuation, intermediate risk secondary to poor exercise tolerance and symptoms during stress;  b. LHC 05/17/2015 90% mid LAD, 80% ost D2, 75% mid RCA, 35% prox RCA. >> S/p CABG  . Diverticulitis   . Wears glasses   . PAF (paroxysmal atrial fibrillation) ([place])     post CABG; Amiodarone stopped 2/2 wheezing;   . History of echocardiogram     a. Echo 9/16: GLS -15.2%, EF 0000000, grade 1 diastolic dysfunction, normal wall motion, aortic sclerosis, dilated aortic root 39 mm, atrial septal lipomatous  hypertrophy    Past Surgical History  Procedure Laterality Date  . Carotid endarterectomy  1994 & redo 2001    Left  . Pr vein bypass graft,aorto-fem-pop  1998  . Iliac artery stent    . Fracture surgery  2013    Right   foo  t X's 2  . Tonsillectomy    . Posterior lumbar fusion  Aug. 14, 2014    Level 1  . Spine surgery  2014  . Cardiac catheterization N/A 05/17/2015    Procedure: Left Heart Cath and Coronary Angiography;  Surgeon: Belva Crome, MD;  Location: [place] CV LAB;  Service: Cardiovascular;  Laterality: N/A;  . Colonoscopy w/ biopsies and polypectomy    . Coronary artery bypass graft N/A 05/23/2015    Procedure: CORONARY ARTERY BYPASS GRAFTING (CABG) x 3 (LIMA to LAD, SVG to DIAGONAL 2, SVG to PDA) with Endoscopic Vein Harvesting from right greater saphenous vein;  Surgeon: Grace Isaac, MD;  Location: [place];  Service: Open Heart Surgery;  Laterality: N/A;  . Tee without cardioversion N/A 05/23/2015    Procedure: TRANSESOPHAGEAL ECHOCARDIOGRAM (TEE);  Surgeon: Grace Isaac, MD;  Location: [place];  Service: Open Heart Surgery;  Laterality: N/A;    Current Medications: Outpatient Prescriptions Prior to Visit  Medication Sig Dispense Refill  .  Ascorbic Acid (VITAMIN C) 100 MG tablet Take 100 mg by mouth daily.    Marland Kitchen aspirin EC 81 MG EC tablet Take 1 tablet (81 mg total) by mouth daily.    Marland Kitchen atorvastatin (LIPITOR) 40 MG tablet Take 40 mg by mouth daily.      . Calcium Carbonate-Vitamin D (CALCIUM-D) 600-400 MG-UNIT TABS Take 1 tablet by mouth daily.     . Cholecalciferol (D-3-5) 5000 UNITS capsule Take 5,000 Units by mouth daily.    . clopidogrel (PLAVIX) 75 MG tablet Take 1 tablet (75 mg total) by mouth daily. 1 tablet 0  . dexlansoprazole (DEXILANT) 60 MG capsule Take 60 mg by mouth daily.    Marland Kitchen glimepiride (AMARYL) 2 MG tablet Take 2 mg by mouth daily before breakfast.      . Hydrocortisone Acetate (PROCTOSOL RE) Place rectally.    . IRON, FERROUS GLUCONATE,  PO Take 40 mg by mouth 2 (two) times daily.    . Liraglutide (VICTOZA [place]) Inject 1.8 mg into the skin daily.     Marland Kitchen losartan-hydrochlorothiazide (HYZAAR) 50-12.5 MG tablet Take 1 tablet by mouth daily. 90 tablet 3  . metFORMIN (GLUCOPHAGE-XR) 500 MG 24 hr tablet Take 2 tablets (1,000 mg total) by mouth 2 (two) times daily.    . metoprolol (LOPRESSOR) 100 MG tablet Take 1 tablet (100 mg total) by mouth 2 (two) times daily. 180 tablet 3  . Multiple Vitamin (MULTIVITAMIN) capsule Take 1 capsule by mouth daily.      . traMADol (ULTRAM) 50 MG tablet Take 1 tablet (50 mg total) by mouth every 6 (six) hours as needed for moderate pain. 30 tablet 0   No facility-administered medications prior to visit.     Allergies:   Hydrocodone; Hydromorphone; Oxycodone; Penicillins; and Sulfa drugs cross reactors   Social History   Social History  . Marital Status: Married    Spouse Name: N/A  . Number of Children: N/A  . Years of Education: N/A   Social History Main Topics  . Smoking status: Former Smoker -- 2.00 packs/day for 40 years    Types: Cigarettes    Quit date: 03/26/2009  . Smokeless tobacco: Never Used  . Alcohol Use: No  . Drug Use: No  . Sexual Activity: Not Asked   Other Topics Concern  . None   Social History Narrative     Family History:  The patient's family history includes Cancer (age of onset: 18) in his mother; Deep vein thrombosis in his father; Hyperlipidemia in his father and mother; Hypertension in his father; Stroke (age of onset: 29) in his father.   ROS:   Please see the history of present illness.    Decreased energy and some difficulty sleeping. All other systems reviewed and are negative.   PHYSICAL EXAM:   VS:  BP 142/84 mmHg  Pulse 78  Ht 6' (1.829 m)  Wt 210 lb 1.9 oz (95.31 kg)  BMI 28.49 kg/m2   GEN: Well nourished, well developed, in no acute distress HEENT: normal Neck: no JVD, carotid bruits, or masses Cardiac:  RRR; no murmurs, rubs, or  gallops,no edema  Respiratory:  clear to auscultation bilaterally, normal work of breathing GI: soft, nontender, nondistended, + BS MS: no deformity or atrophy Skin: warm and dry, no rash Neuro:  Alert and Oriented x 3, Strength and sensation are intact Psych: euthymic mood, full affect  Wt Readings from Last 3 Encounters:  01/30/16 210 lb 1.9 oz (95.31 kg)  10/18/15 206 lb (  93.441 kg)  10/18/15 208 lb (94.348 kg)      Studies/Labs Reviewed:   EKG:  EKG Is not performed/repeated.  Recent Labs: 04/30/2015: Pro B Natriuretic peptide (BNP) 6.0 05/24/2015: Magnesium 1.9 05/26/2015: TSH 2.630 05/30/2015: ALT 18 06/13/2015: Hemoglobin 11.3*; Platelets 429* 09/19/2015: BUN 20; Creat 1.76*; Potassium 4.2; Sodium 137   Lipid Panel    Component Value Date/Time   CHOL 82 06/01/2015 0315   TRIG 158* 06/01/2015 0315   HDL 24* 06/01/2015 0315   CHOLHDL 3.4 06/01/2015 0315   VLDL 32 06/01/2015 0315   LDLCALC 26 06/01/2015 0315    Additional studies/ records that were reviewed today include:  No new data.  Bypass surgery performed on 05/24/15: SURGICAL PROCEDURE: Coronary artery bypass grafting x3 with the left internal mammary to the left anterior descending coronary artery, reverse saphenous vein graft to the diagonal coronary artery, reverse saphenous vein graft to the posterior descending coronary artery with right thigh greater saphenous vein harvested endoscopically.  Problems/conclusions: 1. Coronary artery disease involving native coronary artery of native heart with other form of angina pectoris ([place])   2. Essential hypertension   3. Atrial fibrillation with rapid ventricular response ([place])   4. Hyperlipidemia      PLAN:  In order of problems listed above:  1. Stable. No changes.  2. Complains of fatigue. Because of tis,  I will decrease metoprolol to 50 mg twice daily. If blood pressure runs greater than 150/90 mmHg consistently, we will need to make adjustments. We  will see if decreasing metoprolol helps improve fatigue. 3. Monitor for recurrences of atrial fibrillation. Call with report if palpitations or dyspnea develop.       Medication Adjustments/Labs and Tests Ordered: Current medicines are reviewed at length with the patient today.  Concerns regarding medicines are outlined above.  Medication changes, Labs and Tests ordered today are listed in the Patient Instructions below. There are no Patient Instructions on file for this visit.   Signed, Sinclair Grooms, MD  01/30/2016 10:44 AM    [facility] [address], [place], [place]  [address] Phone: [phone]; Fax: [phone]

## 2016-02-05 DIAGNOSIS — G4733 Obstructive sleep apnea (adult) (pediatric): Secondary | ICD-10-CM | POA: Diagnosis not present

## 2016-03-03 DIAGNOSIS — K219 Gastro-esophageal reflux disease without esophagitis: Secondary | ICD-10-CM | POA: Diagnosis not present

## 2016-03-03 DIAGNOSIS — Z86010 Personal history of colonic polyps: Secondary | ICD-10-CM | POA: Diagnosis not present

## 2016-03-06 DIAGNOSIS — E291 Testicular hypofunction: Secondary | ICD-10-CM | POA: Diagnosis not present

## 2016-03-06 DIAGNOSIS — E1142 Type 2 diabetes mellitus with diabetic polyneuropathy: Secondary | ICD-10-CM | POA: Diagnosis not present

## 2016-03-06 DIAGNOSIS — I2510 Atherosclerotic heart disease of native coronary artery without angina pectoris: Secondary | ICD-10-CM | POA: Diagnosis not present

## 2016-03-06 DIAGNOSIS — Z7984 Long term (current) use of oral hypoglycemic drugs: Secondary | ICD-10-CM | POA: Diagnosis not present

## 2016-03-06 DIAGNOSIS — E7800 Pure hypercholesterolemia, unspecified: Secondary | ICD-10-CM | POA: Diagnosis not present

## 2016-03-06 DIAGNOSIS — Z79899 Other long term (current) drug therapy: Secondary | ICD-10-CM | POA: Diagnosis not present

## 2016-03-06 DIAGNOSIS — I10 Essential (primary) hypertension: Secondary | ICD-10-CM | POA: Diagnosis not present

## 2016-03-06 DIAGNOSIS — Z0000 Encounter for general adult medical examination without abnormal findings: Secondary | ICD-10-CM | POA: Diagnosis not present

## 2016-03-06 DIAGNOSIS — Z125 Encounter for screening for malignant neoplasm of prostate: Secondary | ICD-10-CM | POA: Diagnosis not present

## 2016-03-10 ENCOUNTER — Telehealth: Payer: Self-pay

## 2016-03-10 NOTE — Telephone Encounter (Signed)
Cardiac clearance request received for [facility]. Patient is scheduled to have a colonoscopy on 03/26/16 with Dr.Magod. He will need to HOLD his Plavix prior to procedure.  Request fwd to Dr.Smith to advise. Request should be faxed to [facility] @ [phone]

## 2016-03-13 NOTE — Telephone Encounter (Signed)
The patient is cleared for the upcoming colonoscopy. No workup is needed.

## 2016-03-14 NOTE — Telephone Encounter (Signed)
Clearance placed in MR nurse fax box to be faxed to [facility]

## 2016-03-16 HISTORY — PX: COLONOSCOPY W/ BIOPSIES AND POLYPECTOMY: SHX1376

## 2016-03-19 ENCOUNTER — Telehealth: Payer: Self-pay | Admitting: Interventional Cardiology

## 2016-03-19 NOTE — Telephone Encounter (Signed)
It would be okay to proceed with colonoscopy. He can hold the Plavix for up to 5 days before the procedure. He would also be reasonable the whole aspirin for 3-4 days before the procedure. Resume both after the procedure.

## 2016-03-19 NOTE — Telephone Encounter (Signed)
Request to HOLD Plavix  For patients upcoming colonoscopy forwarded to [place]

## 2016-03-19 NOTE — Telephone Encounter (Signed)
New message      Request for surgical clearance:  1. What type of surgery is being performed? colonoscopy  When is this surgery scheduled? 03-26-16 2. Are there any medications that need to be held prior to surgery and how long? Hold plavix and for how many days.  They received a note saying pt can have colonoscopy but we did not address holding plavix  Name of physician performing surgery? Dr Watt Climes What is your office phone and fax number? Please leave ans on vm [phone]

## 2016-03-21 NOTE — Telephone Encounter (Signed)
lmom as requested by Pamala Hurry @ Dr.Magod's office with Dr.Smith's response.  Ok to HOLD Plavix up to 5 days prior to procedure and Aspirin 3-4 days prior to procedure. She is to call back if any questions.

## 2016-03-21 NOTE — Telephone Encounter (Signed)
Clearance placed in MR nurse fax box to be faxed

## 2016-03-26 ENCOUNTER — Other Ambulatory Visit: Payer: Self-pay | Admitting: Gastroenterology

## 2016-03-26 DIAGNOSIS — K648 Other hemorrhoids: Secondary | ICD-10-CM | POA: Diagnosis not present

## 2016-03-26 DIAGNOSIS — Z86010 Personal history of colonic polyps: Secondary | ICD-10-CM | POA: Diagnosis not present

## 2016-03-26 DIAGNOSIS — K644 Residual hemorrhoidal skin tags: Secondary | ICD-10-CM | POA: Diagnosis not present

## 2016-03-26 DIAGNOSIS — K5730 Diverticulosis of large intestine without perforation or abscess without bleeding: Secondary | ICD-10-CM | POA: Diagnosis not present

## 2016-03-26 DIAGNOSIS — K635 Polyp of colon: Secondary | ICD-10-CM | POA: Diagnosis not present

## 2016-03-26 DIAGNOSIS — D125 Benign neoplasm of sigmoid colon: Secondary | ICD-10-CM | POA: Diagnosis not present

## 2016-05-14 DIAGNOSIS — G4733 Obstructive sleep apnea (adult) (pediatric): Secondary | ICD-10-CM | POA: Diagnosis not present

## 2016-05-22 ENCOUNTER — Other Ambulatory Visit: Payer: Self-pay | Admitting: Family Medicine

## 2016-05-22 DIAGNOSIS — R1032 Left lower quadrant pain: Secondary | ICD-10-CM | POA: Diagnosis not present

## 2016-05-22 DIAGNOSIS — R1011 Right upper quadrant pain: Secondary | ICD-10-CM | POA: Diagnosis not present

## 2016-05-22 DIAGNOSIS — M545 Low back pain: Secondary | ICD-10-CM | POA: Diagnosis not present

## 2016-05-22 DIAGNOSIS — Z23 Encounter for immunization: Secondary | ICD-10-CM | POA: Diagnosis not present

## 2016-05-27 ENCOUNTER — Ambulatory Visit
Admission: RE | Admit: 2016-05-27 | Discharge: 2016-05-27 | Disposition: A | Discharge status: Home / Self Care | Payer: PPO | Source: Ambulatory Visit | Attending: Family Medicine | Admitting: Family Medicine

## 2016-05-27 DIAGNOSIS — R1011 Right upper quadrant pain: Secondary | ICD-10-CM

## 2016-05-27 DIAGNOSIS — K8020 Calculus of gallbladder without cholecystitis without obstruction: Secondary | ICD-10-CM | POA: Diagnosis not present

## 2016-05-28 DIAGNOSIS — M545 Low back pain: Secondary | ICD-10-CM | POA: Diagnosis not present

## 2016-05-30 ENCOUNTER — Other Ambulatory Visit: Payer: PPO

## 2016-05-30 DIAGNOSIS — M545 Low back pain: Secondary | ICD-10-CM | POA: Diagnosis not present

## 2016-06-02 DIAGNOSIS — M545 Low back pain: Secondary | ICD-10-CM | POA: Diagnosis not present

## 2016-06-03 DIAGNOSIS — K8010 Calculus of gallbladder with chronic cholecystitis without obstruction: Secondary | ICD-10-CM | POA: Diagnosis not present

## 2016-06-03 DIAGNOSIS — K8020 Calculus of gallbladder without cholecystitis without obstruction: Secondary | ICD-10-CM | POA: Diagnosis not present

## 2016-06-03 DIAGNOSIS — K5732 Diverticulitis of large intestine without perforation or abscess without bleeding: Secondary | ICD-10-CM | POA: Diagnosis not present

## 2016-06-04 ENCOUNTER — Other Ambulatory Visit: Payer: Self-pay | Admitting: Surgery

## 2016-06-04 DIAGNOSIS — K8010 Calculus of gallbladder with chronic cholecystitis without obstruction: Secondary | ICD-10-CM

## 2016-06-06 DIAGNOSIS — M545 Low back pain: Secondary | ICD-10-CM | POA: Diagnosis not present

## 2016-06-09 DIAGNOSIS — M545 Low back pain: Secondary | ICD-10-CM | POA: Diagnosis not present

## 2016-06-13 DIAGNOSIS — M545 Low back pain: Secondary | ICD-10-CM | POA: Diagnosis not present

## 2016-06-16 DIAGNOSIS — G4733 Obstructive sleep apnea (adult) (pediatric): Secondary | ICD-10-CM | POA: Diagnosis not present

## 2016-06-17 ENCOUNTER — Ambulatory Visit (HOSPITAL_COMMUNITY): Payer: PPO

## 2016-06-17 ENCOUNTER — Encounter (HOSPITAL_COMMUNITY)
Admission: RE | Admit: 2016-06-17 | Discharge: 2016-06-17 | Disposition: A | Discharge status: Home / Self Care | Payer: PPO | Source: Ambulatory Visit | Attending: Surgery | Admitting: Surgery

## 2016-06-17 DIAGNOSIS — K8010 Calculus of gallbladder with chronic cholecystitis without obstruction: Secondary | ICD-10-CM

## 2016-06-17 DIAGNOSIS — M545 Low back pain: Secondary | ICD-10-CM | POA: Diagnosis not present

## 2016-06-17 DIAGNOSIS — K219 Gastro-esophageal reflux disease without esophagitis: Secondary | ICD-10-CM | POA: Diagnosis not present

## 2016-06-17 MED ORDER — TECHNETIUM TC 99M SULFUR COLLOID
2.0000 | Freq: Once | INTRAVENOUS | Status: AC | PRN
  Administered 2016-06-17: 2 via ORAL

## 2016-06-19 ENCOUNTER — Telehealth: Payer: Self-pay | Admitting: Interventional Cardiology

## 2016-06-19 DIAGNOSIS — X32XXXA Exposure to sunlight, initial encounter: Secondary | ICD-10-CM | POA: Diagnosis not present

## 2016-06-19 DIAGNOSIS — Z85828 Personal history of other malignant neoplasm of skin: Secondary | ICD-10-CM | POA: Diagnosis not present

## 2016-06-19 DIAGNOSIS — D485 Neoplasm of uncertain behavior of skin: Secondary | ICD-10-CM | POA: Diagnosis not present

## 2016-06-19 DIAGNOSIS — D044 Carcinoma in situ of skin of scalp and neck: Secondary | ICD-10-CM | POA: Diagnosis not present

## 2016-06-19 DIAGNOSIS — D0462 Carcinoma in situ of skin of left upper limb, including shoulder: Secondary | ICD-10-CM | POA: Diagnosis not present

## 2016-06-19 DIAGNOSIS — Z08 Encounter for follow-up examination after completed treatment for malignant neoplasm: Secondary | ICD-10-CM | POA: Diagnosis not present

## 2016-06-19 DIAGNOSIS — L570 Actinic keratosis: Secondary | ICD-10-CM | POA: Diagnosis not present

## 2016-06-19 NOTE — Telephone Encounter (Signed)
Request for surgical clearance:  1. What type of surgery is being performed? Gallbladder removed   2. When is this surgery scheduled? No date   3. Are there any medications that need to be held prior to surgery and how long?Can he stop his Plavix and for how long?  4. Name of physician performing surgery? Dr Michael Boston   5. What is your office phone and fax number? [phone] and fax is [phone]

## 2016-06-20 DIAGNOSIS — M545 Low back pain: Secondary | ICD-10-CM | POA: Diagnosis not present

## 2016-06-20 NOTE — Telephone Encounter (Signed)
Faxed note to Dr. Clyda Greener office.

## 2016-06-20 NOTE — Telephone Encounter (Signed)
Cleared for upcoming gallbladder surgery. Plavix is not because of coronary stents. I believe this was started by vascular surgery. It will be okay to hold the Plavix for up to 7 days before surgery and to resume when safe.

## 2016-06-23 ENCOUNTER — Ambulatory Visit: Payer: Self-pay | Admitting: Surgery

## 2016-06-23 NOTE — H&P (Signed)
Bradley [name] 06/03/2016 1:37 PM Location: [facility] Patient #: [id number] DOB: 1945-09-21 Married / Language: Bradley [name] / Race: White Male   History of Present Illness Adin Hector MD; 06/03/2016 4:02 PM) Patient words: gallbladder.  The patient is a 70 year old male who presents for evaluation of gall stones. Note for "Gall stones": Patient sent for surgical consultation at the request of Dr. Harrington Challenger. Concern for symptomatic gallstones.  Pleasant active male. History with significant atherosclerotic cardiovascular disease. Aortobifemoral bypass in 1999. Carotid endarterectomy 1994. Triple bypass 2016. He is on Plavix. He is struggle was episodes of waking up feeling very nauseated and vomiting bile. He's had mild episodes of nausea for several years. However in the past 2 months he's had much more severe episodes. Took a week to get over 1 attack. Then happened again, so his wife convinved him to see his primary care physician. He had a mildly elevated lipase. Liver function tests okay. Did not seem consistent with his heartburn or reflux. No sick contacts. No travel history. No change in diet. Concern for perhaps gallstone pancreatitis. Ultrasound did show gallstones. Surgical consultation recommended to consider cholecystectomy. The patient's been sticking to mainly a liquid soft diet. He does have a history of sigmoid diverticulosis and possible diverticulitis on oral antibiotics in the past. He notes he'll get left lower quadrant discomfort. Just finishing a round of Cipro and metronidazole under the presumption of diverticulitis. CAT scans 2014 and 2016 were negative for diverticulitis low. Apparently he did have right-sided abdominal pain on physical exam by primary care physician but no real Murphy sign. Follow-up by Dr. Watt Climes with [facility] gastroenterology. Usually moves his bowels about twice a day with help of a fiber supplement. He uses a treadmill walks  rather regularly.  No personal nor family history of GI/colon cancer, inflammatory bowel disease, irritable bowel syndrome, allergy such as Celiac Sprue, dietary/dairy problems, colitis, ulcers nor gastritis. No recent sick contacts/gastroenteritis. No travel outside the country. No changes in diet. No dysphagia to solids or liquids. No significant heartburn or reflux. No hematochezia, hematemesis, coffee ground emesis. No evidence of prior gastric/peptic ulceration.   Other Problems Mammie Lorenzo, LPN; 49/67/5916 3:84 PM) Back Pain Cerebrovascular Accident Cholelithiasis Diabetes Mellitus Gastroesophageal Reflux Disease Hemorrhoids High blood pressure Hypercholesterolemia Myocardial infarction Pancreatitis Sleep Apnea Vascular Disease  Past Surgical History Mammie Lorenzo, LPN; 66/59/9357 0:17 PM) Coronary Artery Bypass Graft  Diagnostic Studies History Mammie Lorenzo, LPN; 79/39/0300 9:23 PM) Colonoscopy within last year  Allergies Mammie Lorenzo, LPN; 30/02/6225 3:33 PM) Penicillins Sulfa Antibiotics Dilaudid *ANALGESICS - OPIOID* OxyCODONE HCl (Abuse Deter) *ANALGESICS - OPIOID* Hydrocodone-Acetaminophen *ANALGESICS - OPIOID* TraMADol HCl *ANALGESICS - OPIOID*  Medication History Mammie Lorenzo, LPN; 54/56/2563 8:93 PM) Atorvastatin Calcium (40MG  Tablet, Oral) Active. Losartan Potassium-HCTZ (50-12.5MG  Tablet, Oral) Active. Metoprolol Tartrate (50MG  Tablet, Oral) Active. Glimepiride (2MG  Tablet, Oral) Active. Clopidogrel Bisulfate (75MG  Tablet, Oral) Active. Dexilant (60MG  Capsule DR, Oral) Active. Zantac 75 (75MG  Tablet, Oral) Active. Aspirin (81MG  Tablet DR, Oral) Active. Victoza (18MG /3ML Solution, Subcutaneous) Active. Calcium-Vitamin D (600MG  Tablet, Oral) Active. Vitamin D3 (5000UNIT Tablet, Oral) Active. Iron (Ferrous Gluconate) (325MG  Tablet, Oral) Active. Multivitamin Adult (Oral) Active. Vitamin C (100MG  Tablet,  Oral) Active. Medications Reconciled  Social History Mammie Lorenzo, LPN; 73/42/8768 1:15 PM) Caffeine use Carbonated beverages, Coffee. No alcohol use No drug use Tobacco use Former smoker.  Family History Mammie Lorenzo, LPN; 72/62/0355 9:74 PM) Cerebrovascular Accident Father. Malignant Neoplasm Of Pancreas Mother.    Review of Systems Claiborne Billings [facility] LPN;  06/03/2016 1:37 PM) General Not Present- Appetite Loss, Chills, Fatigue, Fever, Night Sweats, Weight Gain and Weight Loss. Skin Not Present- Change in Wart/Mole, Dryness, Hives, Jaundice, New Lesions, Non-Healing Wounds, Rash and Ulcer. HEENT Present- Hearing Loss and Wears glasses/contact lenses. Not Present- Earache, Hoarseness, Nose Bleed, Oral Ulcers, Ringing in the Ears, Seasonal Allergies, Sinus Pain, Sore Throat, Visual Disturbances and Yellow Eyes. Respiratory Present- Snoring. Not Present- Bloody sputum, Chronic Cough, Difficulty Breathing and Wheezing. Cardiovascular Not Present- Chest Pain, Difficulty Breathing Lying Down, Leg Cramps, Palpitations, Rapid Heart Rate, Shortness of Breath and Swelling of Extremities. Gastrointestinal Not Present- Abdominal Pain, Bloating, Bloody Stool, Change in Bowel Habits, Chronic diarrhea, Constipation, Difficulty Swallowing, Excessive gas, Gets full quickly at meals, Hemorrhoids, Indigestion, Nausea, Rectal Pain and Vomiting. Male Genitourinary Not Present- Blood in Urine, Change in Urinary Stream, Frequency, Impotence, Nocturia, Painful Urination, Urgency and Urine Leakage. Musculoskeletal Not Present- Back Pain, Joint Pain, Joint Stiffness, Muscle Pain, Muscle Weakness and Swelling of Extremities. Neurological Not Present- Decreased Memory, Fainting, Headaches, Numbness, Seizures, Tingling, Tremor, Trouble walking and Weakness. Psychiatric Not Present- Anxiety, Bipolar, Change in Sleep Pattern, Depression, Fearful and Frequent crying. Endocrine Not Present- Cold Intolerance,  Excessive Hunger, Hair Changes, Heat Intolerance, Hot flashes and New Diabetes. Hematology Present- Blood Thinners. Not Present- Easy Bruising, Excessive bleeding, Gland problems, HIV and Persistent Infections.  Vitals Claiborne Billings Dockery LPN; 99/35/7017 7:93 PM) 06/03/2016 1:38 PM Weight: 210 lb Height: 72in Weight was reported by patient. Body Surface Area: 2.18 m Body Mass Index: 28.48 kg/m  Temp.: 97.33F(Oral)  Pulse: 80 (Regular)  BP: 124/76 (Sitting, Left Arm, Standard)       Physical Exam Adin Hector MD; 06/03/2016 2:24 PM) General Mental Status-Alert. General Appearance-Not in acute distress, Not Sickly. Orientation-Oriented X3. Hydration-Well hydrated. Voice-Normal.  Integumentary Global Assessment Upon inspection and palpation of skin surfaces of the - Axillae: non-tender, no inflammation or ulceration, no drainage. and Distribution of scalp and body hair is normal. General Characteristics Temperature - normal warmth is noted.  Head and Neck Head-normocephalic, atraumatic with no lesions or palpable masses. Face Global Assessment - atraumatic, no absence of expression. Neck Global Assessment - no abnormal movements, no bruit auscultated on the right, no bruit auscultated on the left, no decreased range of motion, non-tender. Trachea-midline. Thyroid Gland Characteristics - non-tender.  Eye Eyeball - Left-Extraocular movements intact, No Nystagmus. Eyeball - Right-Extraocular movements intact, No Nystagmus. Cornea - Left-No Hazy. Cornea - Right-No Hazy. Sclera/Conjunctiva - Left-No scleral icterus, No Discharge. Sclera/Conjunctiva - Right-No scleral icterus, No Discharge. Pupil - Left-Direct reaction to light normal. Pupil - Right-Direct reaction to light normal.  ENMT Ears Pinna - Left - no drainage observed, no generalized tenderness observed. Right - no drainage observed, no generalized tenderness  observed. Nose and Sinuses External Inspection of the Nose - no destructive lesion observed. Inspection of the nares - Left - quiet respiration. Right - quiet respiration. Mouth and Throat Lips - Upper Lip - no fissures observed, no pallor noted. Lower Lip - no fissures observed, no pallor noted. Nasopharynx - no discharge present. Oral Cavity/Oropharynx - Tongue - no dryness observed. Oral Mucosa - no cyanosis observed. Hypopharynx - no evidence of airway distress observed.  Chest and Lung Exam Inspection Movements - Normal and Symmetrical. Accessory muscles - No use of accessory muscles in breathing. Palpation Palpation of the chest reveals - Non-tender. Auscultation Breath sounds - Normal and Clear.  Cardiovascular Auscultation Rhythm - Regular. Murmurs & Other Heart Sounds - Auscultation of the heart  reveals - No Murmurs and No Systolic Clicks.  Abdomen Inspection Inspection of the abdomen reveals - No Visible peristalsis and No Abnormal pulsations. Umbilicus - No Bleeding, No Urine drainage. Palpation/Percussion Palpation and Percussion of the abdomen reveal - Soft, Non Tender, No Rebound tenderness, No Rigidity (guarding) and No Cutaneous hyperesthesia. Note: Midline incision. Obese but soft. Nontender, nondistended. No guarding. No umbilical no other hernias   Male Genitourinary Sexual Maturity Tanner 5 - Adult hair pattern and Adult penile size and shape.  Peripheral Vascular Upper Extremity Inspection - Left - No Cyanotic nailbeds, Not Ischemic. Right - No Cyanotic nailbeds, Not Ischemic.  Neurologic Neurologic evaluation reveals -normal attention span and ability to concentrate, able to name objects and repeat phrases. Appropriate fund of knowledge , normal sensation and normal coordination. Mental Status Affect - not angry, not paranoid. Cranial Nerves-Normal Bilaterally. Gait-Normal.  Neuropsychiatric Mental status exam performed with findings of-able to  articulate well with normal speech/language, rate, volume and coherence, thought content normal with ability to perform basic computations and apply abstract reasoning and no evidence of hallucinations, delusions, obsessions or homicidal/suicidal ideation.  Musculoskeletal Global Assessment Spine, Ribs and Pelvis - no instability, subluxation or laxity. Right Upper Extremity - no instability, subluxation or laxity.  Lymphatic Head & Neck  General Head & Neck Lymphatics: Bilateral - Description - No Localized lymphadenopathy. Axillary  General Axillary Region: Bilateral - Description - No Localized lymphadenopathy. Femoral & Inguinal  Generalized Femoral & Inguinal Lymphatics: Left - Description - No Localized lymphadenopathy. Right - Description - No Localized lymphadenopathy.    Assessment & Plan Adin Hector MD; 06/03/2016 2:23 PM) GALLSTONES (K80.20) Impression: Nausea vomiting and known gallstones. I do suspect the gallbladder is the culprit since the rest of the differential diagnosis seems unlikely, he did have an elevated lipase and is not a alcohol drinker, and his symptoms seemed to be more predominant waking up at night after having a larger meal. Rest of the differential diagnosis seems unlikely.  Obtain records from his gastrologist, Dr. Watt Climes. Sounds like he had recommended cholecystectomy as well.  Cardiac clearance. Most likely okay to come off Plavix. My instinct is to do this back in [place]. The patient and wife strongly requested care in [place].  Because he doesn't really have pain so much, think I would get a gastric emptying study to make sure that gastroparesis is not at least a factor given his diabetes, If that is negative, offer cholecystectomy. Current Plans I recommended obtaining preoperative cardiac clearance for recommendations on management of anticoagualtion perioperatively:  1. Timing of holding anticoagulation 2. Need for any bridge therapy  (SQ enoxaparin, IV heparin, IV Aggrastat, etc) preop/postop. 3. Desired timing of resumption of anticoagulation.  In general from Dr. Johney Maine' standpoint:  Aspirin is okay to continue perioperatively (81mg  or 325mg ) & does not need to be held  Hold warfarin 5 days preoperatively. Consider PT/INR level on arrival to short stay the day of surgery. No need to check PT/INR level on preop visit  Hold P2Y12 inibitors such as clopidrogel (Plavix) 4 days preoperatively  Hold direct thrombin / factor Xa inhibitors (Xerelto, Pradaxa, Eliquis, etc) 2 days preoperatively   Request clearance by cardiology to better assess operative risk & see if a reevaluation, further workup, etc is needed. Also recommendations on how medications such as for anticoagulation and blood pressure should be managed/held/restarted after surgery.  Follow Up - Call [facility] office after tests / studies done to discuss further plans [place] (  K80.10) Current Plans You are being scheduled for surgery - Our schedulers will call you.  You should hear from our office's scheduling department within 5 working days about the location, date, and time of surgery. We try to make accommodations for patient's preferences in scheduling surgery, but sometimes the OR schedule or the surgeon's schedule prevents [place] from making those accommodations.  If you have not heard from our office [phone] in 5 working days, call the office and ask for your surgeon's nurse.  If you have other questions about your diagnosis, plan, or surgery, call the office and ask for your surgeon's nurse.  The anatomy & physiology of hepatobiliary & pancreatic function was discussed. The pathophysiology of gallbladder dysfunction was discussed. Natural history risks without surgery was discussed. I feel the risks of no intervention will lead to serious problems that outweigh the operative risks; therefore, I recommended  cholecystectomy to remove the pathology. I explained laparoscopic techniques with possible need for an open approach. Probable cholangiogram to evaluate the bilary tract was explained as well.  Risks such as bleeding, infection, abscess, leak, injury to other organs, need for further treatment, heart attack, death, and other risks were discussed. I noted a good likelihood this will help address the problem. Possibility that this will not correct all abdominal symptoms was explained. Goals of post-operative recovery were discussed as well. We will work to minimize complications. An educational handout further explaining the pathology and treatment options was given as well. Questions were answered. The patient expresses understanding & wishes to proceed with surgery.  Pt Education - Pamphlet Given - Laparoscopic Gallbladder Surgery: discussed with patient and provided information. Written instructions provided Pt Education - [facility] Laparosopic Post Op HCI (Lisandra Mathisen) Pt Education - [facility] Good Bowel Health (Lanasia Porras) Pt Education - Laparoscopic Cholecystectomy: gallbladder DIVERTICULITIS, COLON (K57.32) Impression: Sounds like he has at least moderate colonic diverticulosis. CAT scan 2014 2016 negative for diverticulitis. He does have left lower quadrant side pain is getting better on oral Cipro/metronidazole. I suspect he had mild and infrequent enough tacks to need any surgical intervention. Follow-up on gastroenterology records first. Current Plans Pt Education - [facility] Diverticular Disease ([place])   Cardiac clearance done.  Gastric emptying study within normal limits, arguing more towards biliary etiology.

## 2016-06-24 DIAGNOSIS — M545 Low back pain: Secondary | ICD-10-CM | POA: Diagnosis not present

## 2016-06-26 DIAGNOSIS — M545 Low back pain: Secondary | ICD-10-CM | POA: Diagnosis not present

## 2016-06-30 ENCOUNTER — Other Ambulatory Visit: Payer: Self-pay | Admitting: Interventional Cardiology

## 2016-07-01 DIAGNOSIS — M545 Low back pain: Secondary | ICD-10-CM | POA: Diagnosis not present

## 2016-07-04 DIAGNOSIS — M545 Low back pain: Secondary | ICD-10-CM | POA: Diagnosis not present

## 2016-07-08 ENCOUNTER — Ambulatory Visit: Admit: 2016-07-08 | Payer: PPO | Admitting: Surgery

## 2016-07-08 DIAGNOSIS — M545 Low back pain: Secondary | ICD-10-CM | POA: Diagnosis not present

## 2016-07-08 SURGERY — LAPAROSCOPIC CHOLECYSTECTOMY SINGLE SITE WITH INTRAOPERATIVE CHOLANGIOGRAM
Anesthesia: General

## 2016-07-10 DIAGNOSIS — M545 Low back pain: Secondary | ICD-10-CM | POA: Diagnosis not present

## 2016-07-18 DIAGNOSIS — D0462 Carcinoma in situ of skin of left upper limb, including shoulder: Secondary | ICD-10-CM | POA: Diagnosis not present

## 2016-07-30 ENCOUNTER — Other Ambulatory Visit: Payer: Self-pay | Admitting: Physician Assistant

## 2016-07-30 DIAGNOSIS — I2510 Atherosclerotic heart disease of native coronary artery without angina pectoris: Secondary | ICD-10-CM

## 2016-07-30 MED ORDER — METOPROLOL TARTRATE 100 MG PO TABS: 50.0000 mg | ORAL_TABLET | Freq: Two times a day (BID) | ORAL | 1 refills | Status: DC

## 2016-08-18 ENCOUNTER — Ambulatory Visit (INDEPENDENT_AMBULATORY_CARE_PROVIDER_SITE_OTHER): Payer: PPO | Admitting: Interventional Cardiology

## 2016-08-18 ENCOUNTER — Encounter: Payer: Self-pay | Admitting: Interventional Cardiology

## 2016-08-18 VITALS — BP 154/90 | HR 68 | Ht 72.0 in | Wt 216.0 lb

## 2016-08-18 DIAGNOSIS — I480 Paroxysmal atrial fibrillation: Secondary | ICD-10-CM

## 2016-08-18 DIAGNOSIS — I25810 Atherosclerosis of coronary artery bypass graft(s) without angina pectoris: Secondary | ICD-10-CM

## 2016-08-18 DIAGNOSIS — I10 Essential (primary) hypertension: Secondary | ICD-10-CM

## 2016-08-18 DIAGNOSIS — I4510 Unspecified right bundle-branch block: Secondary | ICD-10-CM

## 2016-08-18 DIAGNOSIS — Z01818 Encounter for other preprocedural examination: Secondary | ICD-10-CM | POA: Diagnosis not present

## 2016-08-18 DIAGNOSIS — Z9989 Dependence on other enabling machines and devices: Secondary | ICD-10-CM

## 2016-08-18 DIAGNOSIS — G4733 Obstructive sleep apnea (adult) (pediatric): Secondary | ICD-10-CM

## 2016-08-18 NOTE — Patient Instructions (Signed)

## 2016-08-18 NOTE — Progress Notes (Signed)
Cardiology Office Note    Date:  08/18/2016   ID:  Bradley [name], DOB 08/26/1945, MRN [id number]  PCP:  Melinda Crutch, MD  Cardiologist: Sinclair Grooms, MD   Chief Complaint  Patient presents with  . Coronary Artery Disease  . Pre-op Exam    History of Present Illness:  Bradley [name] is a 71 y.o. male male who has CAD, previous coronary bypass surgery October 2016 (with LIMA to the LAD, SVG to the diagonal, SVG to PDA), hypertension, hyperlipidemia, PAD, and diabetes mellitus.  Overall patient is doing well. Following coronary bypass surgery had postoperative atrial fibrillation which resolved. He has completed cardiac rehabilitation. He has no exertional limitations. He is referred back from Dr. Michael Boston for clearance prior to laparoscopic cholecystectomy. There is the impression that he is on Plavix. He has not taken Plavix since bypass surgery in October 2016.  He has had recurring episodes of nausea. He is scheduled laparoscopic cholecystectomy performed by Dr. Johney Maine. He is here today for clearance.   Past Medical History:  Diagnosis Date  . Anemia   . Arthritis   . Carotid artery occlusion    a. Carotid US 10/16: RICA 60-79%, L CEA patent with 1-39% stenosis  . Coronary artery disease    a. Myoview 9/16:  EF 52%, inferior fixed defect consistent with diaphragmatic attenuation, intermediate risk secondary to poor exercise tolerance and symptoms during stress;  b. LHC 05/17/2015 90% mid LAD, 80% ost D2, 75% mid RCA, 35% prox RCA. >> S/p CABG  . Diabetes mellitus age 81  . Diverticulitis   . GERD (gastroesophageal reflux disease)   . H/O hiatal hernia   . History of echocardiogram    a. Echo 9/16: GLS -15.2%, EF 63-84%, grade 1 diastolic dysfunction, normal wall motion, aortic sclerosis, dilated aortic root 39 mm, atrial septal lipomatous hypertrophy  . Hyperlipidemia   . Hypertension   . Irritable bowel syndrome (IBS)   . Myocardial infarction    silent inferior MI;  patient denies MI history (03/17/13)   . Neuropathy ([place]) 2013  . Osteoporosis 2013  . PAF (paroxysmal atrial fibrillation) ([place])    post CABG; Amiodarone stopped 2/2 wheezing;   . Peripheral vascular disease ([place])   . Reflux esophagitis   . Sleep apnea    uses CPAP  . Stroke [facility]) 1987   Right brain stroke  . Wears glasses     Past Surgical History:  Procedure Laterality Date  . CARDIAC CATHETERIZATION N/A 05/17/2015   Procedure: Left Heart Cath and Coronary Angiography;  Surgeon: Belva Crome, MD;  Location: [place] [facility];  Service: Cardiovascular;  Laterality: N/A;  . CAROTID ENDARTERECTOMY  1994 & redo 2001   Left  . COLONOSCOPY W/ BIOPSIES AND POLYPECTOMY    . CORONARY ARTERY BYPASS GRAFT N/A 05/23/2015   Procedure: CORONARY ARTERY BYPASS GRAFTING (CABG) x 3 (LIMA to LAD, SVG to DIAGONAL 2, SVG to PDA) with Endoscopic Vein Harvesting from right greater saphenous vein;  Surgeon: Grace Isaac, MD;  Location: [place];  Service: Open Heart Surgery;  Laterality: N/A;  . FRACTURE SURGERY  2013   Right   foo  t X's 2  . ILIAC ARTERY STENT    . POSTERIOR LUMBAR FUSION  Aug. 14, 2014   Level 1  . PR VEIN BYPASS GRAFT,AORTO-FEM-POP  1998  . [place] SURGERY  2014  . TEE WITHOUT CARDIOVERSION N/A 05/23/2015   Procedure: TRANSESOPHAGEAL ECHOCARDIOGRAM (TEE);  Surgeon: Percell Miller  Maryruth Bun, MD;  Location: [place];  Service: Open Heart Surgery;  Laterality: N/A;  . TONSILLECTOMY      Current Medications: Outpatient Medications Prior to Visit  Medication Sig Dispense Refill  . Ascorbic Acid (VITAMIN C) 100 MG tablet Take 100 mg by mouth daily.    Marland Kitchen aspirin EC 81 MG EC tablet Take 1 tablet (81 mg total) by mouth daily.    Marland Kitchen atorvastatin (LIPITOR) 40 MG tablet Take 40 mg by mouth daily.      . Calcium Carbonate-Vitamin D (CALCIUM-D) 600-400 MG-UNIT TABS Take 1 tablet by mouth daily.     . Cholecalciferol (D-3-5) 5000 UNITS capsule Take 5,000 Units by mouth daily.    . clopidogrel  (PLAVIX) 75 MG tablet Take 1 tablet (75 mg total) by mouth daily. 1 tablet 0  . dexlansoprazole (DEXILANT) 60 MG capsule Take 60 mg by mouth daily.    Marland Kitchen glimepiride (AMARYL) 2 MG tablet Take 2 mg by mouth daily before breakfast.      . Hydrocortisone Acetate (PROCTOSOL RE) Place rectally.    . IRON, FERROUS GLUCONATE, PO Take 40 mg by mouth 2 (two) times daily.    . Liraglutide (VICTOZA [place]) Inject 1.8 mg into the skin daily.     Marland Kitchen losartan-hydrochlorothiazide (HYZAAR) 50-12.5 MG tablet TAKE 1 TABLET BY MOUTH EVERY DAY 90 tablet 1  . metFORMIN (GLUCOPHAGE-XR) 500 MG 24 hr tablet Take 2 tablets (1,000 mg total) by mouth 2 (two) times daily.    . Multiple Vitamin (MULTIVITAMIN) capsule Take 1 capsule by mouth daily.      . traMADol (ULTRAM) 50 MG tablet Take 1 tablet (50 mg total) by mouth every 6 (six) hours as needed for moderate pain. 30 tablet 0  . metoprolol (LOPRESSOR) 100 MG tablet Take 0.5 tablets (50 mg total) by mouth 2 (two) times daily. 90 tablet 1   No facility-administered medications prior to visit.      Allergies:   Hydrocodone; Hydromorphone; Oxycodone; Penicillins; and Sulfa drugs cross reactors   Social History   Social History  . Marital status: Married    Spouse name: N/A  . Number of children: N/A  . Years of education: N/A   Social History Main Topics  . Smoking status: Former Smoker    Packs/day: 2.00    Years: 40.00    Types: Cigarettes    Quit date: 03/26/2009  . Smokeless tobacco: Never Used  . Alcohol use No  . Drug use: No  . Sexual activity: Not on file   Other Topics Concern  . Not on file   Social History Narrative  . No narrative on file     Family History:  The patient's family history includes Cancer (age of onset: 110) in his mother; Deep vein thrombosis in his father; Hyperlipidemia in his father and mother; Hypertension in his father; Stroke (age of onset: 68) in his father.   ROS:   Please see the history of present illness.      Recurring diarrhea, bloating, nausea, vomiting, and excessive fatigue.  All other systems reviewed and are negative.   PHYSICAL EXAM:   VS:  BP (!) 154/90 (BP Location: Right Arm)   Pulse 68   Ht 6' (1.829 m)   Wt 216 lb (98 kg)   BMI 29.29 kg/m    GEN: Well nourished, well developed, in no acute distress  HEENT: normal  Neck: no JVD, carotid bruits, or masses Cardiac: RRR; no murmurs, rubs, or gallops,no edema  Respiratory:  clear to auscultation bilaterally, normal work of breathing GI: soft, nontender, nondistended, + BS MS: no deformity or atrophy  Skin: warm and dry, no rash Neuro:  Alert and Oriented x 3, Strength and sensation are intact Psych: euthymic mood, full affect  Wt Readings from Last 3 Encounters:  08/18/16 216 lb (98 kg)  01/30/16 210 lb 1.9 oz (95.3 kg)  10/18/15 206 lb (93.4 kg)      Studies/Labs Reviewed:   EKG:  EKG  Normal sinus rhythm at heart rate is 68 bpm. Overall tracing is normal.  Recent Labs: 09/19/2015: BUN 20; Creat 1.76; Potassium 4.2; Sodium 137   Lipid Panel    Component Value Date/Time   CHOL 82 06/01/2015 0315   TRIG 158 (H) 06/01/2015 0315   HDL 24 (L) 06/01/2015 0315   CHOLHDL 3.4 06/01/2015 0315   VLDL 32 06/01/2015 0315   LDLCALC 26 06/01/2015 0315    Additional studies/ records that were reviewed today include:  Cardiac catheterization 2016: Diagnostic Diagram      Cardiac catheterization was followed by coronary bypass grafting: October 2016: LIMA to LAD, SVG to diagonal, and SVG to the PDA.  ASSESSMENT:    1. Preoperative clearance   2. CAD in native artery   3. Essential hypertension   4. OSA on CPAP   5. Right bundle branch block   6. PAF (paroxysmal atrial fibrillation) ([name])      PLAN:  In order of problems listed above:  1. He is cleared for the upcoming cholecystectomy. Plavix was not prescribed by cardiology but I believe vascular surgery. It would be appropriate to stop this medication for at  least 7 days prior to surgery. I've asked the patient to discuss with Dr. Sherren Mocha Early if it is really necessary to resume clopidogrel. 2. Coronary artery disease, currently stable status post coronary bypass grafting. No change in therapy is required. 3. Elevated pressure on today's office visit. He monitors his blood pressure at home and is surprised by the current levels. I've asked him to continue to monitor. If he consistently runs above 150/90 mmHg, will make further adjustment in medical regimen. This will probably include increasing Hyzaar to 100/12.5 mg daily. 4. Continue C Pap 5. Previously described right bundle branch block has resolved on today's EKG. 6. No clinical recurrence of atrial fibrillation which occurred post coronary bypass surgery. The assumption is that this was transient following open-heart surgery.  The patient is cleared for his upcoming cholecystectomy. 2 return for cardiac follow-up in 10-12 months. It is okay to cause Plavix. Plavix was not started by cardiology but rather vascular surgery. I've asked the patient to discuss with Dr. Donnetta Hutching to determine if the medication should be continued. Dr. Donnetta Hutching has performed left carotid endarterectomy 2.  Medication Adjustments/Labs and Tests Ordered: Current medicines are reviewed at length with the patient today.  Concerns regarding medicines are outlined above.  Medication changes, Labs and Tests ordered today are listed in the Patient Instructions below. Patient Instructions  Medication Instructions:  None  Labwork: None  Testing/Procedures: None  Follow-Up: Your physician wants you to follow-up in: 1 year with Dr. Tamala Julian.  You will receive a reminder letter in the mail two months in advance. If you don't receive a letter, please call our office to schedule the follow-up appointment.   Any Other Special Instructions Will Be Listed Below (If Applicable).     If you need a refill on your cardiac medications before  your next appointment,  please call your pharmacy.      Signed, Sinclair Grooms, MD  08/18/2016 2:21 PM    [facility] [place], [place], [place]  [address] Phone: [phone]; Fax: [phone]

## 2016-09-02 ENCOUNTER — Encounter: Payer: Self-pay | Admitting: Family

## 2016-09-03 DIAGNOSIS — M65322 Trigger finger, left index finger: Secondary | ICD-10-CM | POA: Diagnosis not present

## 2016-09-03 DIAGNOSIS — M65331 Trigger finger, right middle finger: Secondary | ICD-10-CM | POA: Diagnosis not present

## 2016-09-05 DIAGNOSIS — E1142 Type 2 diabetes mellitus with diabetic polyneuropathy: Secondary | ICD-10-CM | POA: Diagnosis not present

## 2016-09-05 DIAGNOSIS — R197 Diarrhea, unspecified: Secondary | ICD-10-CM | POA: Diagnosis not present

## 2016-09-09 ENCOUNTER — Ambulatory Visit (INDEPENDENT_AMBULATORY_CARE_PROVIDER_SITE_OTHER): Payer: PPO | Admitting: Family

## 2016-09-09 ENCOUNTER — Ambulatory Visit (HOSPITAL_COMMUNITY)
Admission: RE | Admit: 2016-09-09 | Discharge: 2016-09-09 | Disposition: A | Discharge status: Home / Self Care | Payer: PPO | Source: Ambulatory Visit | Attending: Family | Admitting: Family

## 2016-09-09 ENCOUNTER — Encounter: Payer: Self-pay | Admitting: Family

## 2016-09-09 ENCOUNTER — Ambulatory Visit (INDEPENDENT_AMBULATORY_CARE_PROVIDER_SITE_OTHER)
Admission: RE | Admit: 2016-09-09 | Discharge: 2016-09-09 | Disposition: A | Discharge status: Home / Self Care | Payer: PPO | Source: Ambulatory Visit | Attending: Family | Admitting: Family

## 2016-09-09 VITALS — BP 146/83 | HR 73 | Temp 97.1°F | Resp 18 | Ht 72.0 in | Wt 210.0 lb

## 2016-09-09 DIAGNOSIS — I739 Peripheral vascular disease, unspecified: Secondary | ICD-10-CM

## 2016-09-09 DIAGNOSIS — Z95828 Presence of other vascular implants and grafts: Secondary | ICD-10-CM | POA: Diagnosis not present

## 2016-09-09 DIAGNOSIS — I779 Disorder of arteries and arterioles, unspecified: Secondary | ICD-10-CM

## 2016-09-09 DIAGNOSIS — Z48812 Encounter for surgical aftercare following surgery on the circulatory system: Secondary | ICD-10-CM | POA: Diagnosis not present

## 2016-09-09 DIAGNOSIS — Z9889 Other specified postprocedural states: Secondary | ICD-10-CM

## 2016-09-09 DIAGNOSIS — I6523 Occlusion and stenosis of bilateral carotid arteries: Secondary | ICD-10-CM

## 2016-09-09 DIAGNOSIS — I6521 Occlusion and stenosis of right carotid artery: Secondary | ICD-10-CM | POA: Insufficient documentation

## 2016-09-09 NOTE — Progress Notes (Signed)
[facility] [place] HISTORY AND PHYSICAL   MRN : [id number]  History of Present Illness:   Bradley [name] is a 71 y.o. male patient of Dr. Donnetta Hutching returns today for followup of his diffuse peripheral vascular occlusive disease. He undergone prior aortobifemoral bypass for lower extremity arterial occlusive disease in 1998. He underwent a left carotid endarterectomy in 1994 with a redo for recurrent stenosis in 2001.  He report calves feeling tired after walking about 30 minutes on his treadmill, resolves with rest, denies non healing wounds. He walks 30 minutes daily, 4 days/week, on his treadmill.   Pt reports stroke at age 42, then several TIA's, the last TIA was the night before the 2001 redo of the left CEA.  He had 3 vessel CABG on 05/23/15 by Dr. Servando Snare.  Pt states he did not have an MI, but was tired all the time; stress test showed the degree of CAD. He continues cardiac rehab.   Pt Diabetic: Yes, states A1C is about 7.2 Pt smoker: former smoker, quit in 2010  Pt meds include: Statin :Yes Betablocker: Yes ASA: Yes, stopped for GB repair in 2 days Other anticoagulants/antiplatelets: Plavix, stopped for GB repair in 2 days   Current Outpatient Prescriptions  Medication Sig Dispense Refill  . Ascorbic Acid (VITAMIN C) 100 MG tablet Take 100 mg by mouth daily.    Marland Kitchen aspirin EC 81 MG EC tablet Take 1 tablet (81 mg total) by mouth daily.    Marland Kitchen atorvastatin (LIPITOR) 40 MG tablet Take 40 mg by mouth daily.      . Calcium Carbonate-Vitamin D (CALCIUM-D) 600-400 MG-UNIT TABS Take 1 tablet by mouth daily.     . Cholecalciferol (D-3-5) 5000 UNITS capsule Take 5,000 Units by mouth daily.    . clopidogrel (PLAVIX) 75 MG tablet Take 1 tablet (75 mg total) by mouth daily. 1 tablet 0  . dexlansoprazole (DEXILANT) 60 MG capsule Take 60 mg by mouth daily.    Marland Kitchen glimepiride (AMARYL) 2 MG tablet Take 2 mg by mouth daily before breakfast.      . Hydrocortisone Acetate  (PROCTOSOL RE) Place rectally.    . IRON, FERROUS GLUCONATE, PO Take 40 mg by mouth 2 (two) times daily.    . Liraglutide (VICTOZA [place]) Inject 1.8 mg into the skin daily.     Marland Kitchen losartan-hydrochlorothiazide (HYZAAR) 50-12.5 MG tablet TAKE 1 TABLET BY MOUTH EVERY DAY 90 tablet 1  . metFORMIN (GLUCOPHAGE-XR) 500 MG 24 hr tablet Take 2 tablets (1,000 mg total) by mouth 2 (two) times daily.    . metoprolol (LOPRESSOR) 50 MG tablet Take 50 mg by mouth 2 (two) times daily.  11  . Multiple Vitamin (MULTIVITAMIN) capsule Take 1 capsule by mouth daily.      . traMADol (ULTRAM) 50 MG tablet Take 1 tablet (50 mg total) by mouth every 6 (six) hours as needed for moderate pain. (Patient not taking: Reported on 09/09/2016) 30 tablet 0   No current facility-administered medications for this visit.     Past Medical History:  Diagnosis Date  . Anemia   . Arthritis   . Carotid artery occlusion    a. Carotid US 10/16: RICA 60-79%, L CEA patent with 1-39% stenosis  . Coronary artery disease    a. Myoview 9/16:  EF 52%, inferior fixed defect consistent with diaphragmatic attenuation, intermediate risk secondary to poor exercise tolerance and symptoms during stress;  b. LHC 05/17/2015 90% mid LAD, 80% ost D2, 75% mid  RCA, 35% prox RCA. >> S/p CABG  . Diabetes mellitus age 32  . Diverticulitis   . GERD (gastroesophageal reflux disease)   . H/O hiatal hernia   . History of echocardiogram    a. Echo 9/16: GLS -15.2%, EF 19-62%, grade 1 diastolic dysfunction, normal wall motion, aortic sclerosis, dilated aortic root 39 mm, atrial septal lipomatous hypertrophy  . Hyperlipidemia   . Hypertension   . Irritable bowel syndrome (IBS)   . Myocardial infarction    silent inferior MI; patient denies MI history (03/17/13)   . Neuropathy ([place]) 2013  . Osteoporosis 2013  . PAF (paroxysmal atrial fibrillation) ([place])    post CABG; Amiodarone stopped 2/2 wheezing;   . Peripheral vascular disease ([place])   . Reflux esophagitis    . Sleep apnea    uses CPAP  . Stroke [facility]) 1987   Right brain stroke  . Wears glasses     Social History Social History  Substance Use Topics  . Smoking status: Former Smoker    Packs/day: 2.00    Years: 40.00    Types: Cigarettes    Quit date: 03/26/2009  . Smokeless tobacco: Never Used  . Alcohol use No    Family History Family History  Problem Relation Age of Onset  . Cancer Mother 38    pancreatic  . Hyperlipidemia Mother   . Stroke Father 58  . Deep vein thrombosis Father   . Hyperlipidemia Father   . Hypertension Father     Surgical History Past Surgical History:  Procedure Laterality Date  . CARDIAC CATHETERIZATION N/A 05/17/2015   Procedure: Left Heart Cath and Coronary Angiography;  Surgeon: Belva Crome, MD;  Location: [place] [facility];  Service: Cardiovascular;  Laterality: N/A;  . CAROTID ENDARTERECTOMY  1994 & redo 2001   Left  . COLONOSCOPY W/ BIOPSIES AND POLYPECTOMY    . CORONARY ARTERY BYPASS GRAFT N/A 05/23/2015   Procedure: CORONARY ARTERY BYPASS GRAFTING (CABG) x 3 (LIMA to LAD, SVG to DIAGONAL 2, SVG to PDA) with Endoscopic Vein Harvesting from right greater saphenous vein;  Surgeon: Grace Isaac, MD;  Location: [place];  Service: Open Heart Surgery;  Laterality: N/A;  . FRACTURE SURGERY  2013   Right   foo  t X's 2  . ILIAC ARTERY STENT    . POSTERIOR LUMBAR FUSION  Aug. 14, 2014   Level 1  . PR VEIN BYPASS GRAFT,AORTO-FEM-POP  1998  . [place] SURGERY  2014  . TEE WITHOUT CARDIOVERSION N/A 05/23/2015   Procedure: TRANSESOPHAGEAL ECHOCARDIOGRAM (TEE);  Surgeon: Grace Isaac, MD;  Location: [place];  Service: Open Heart Surgery;  Laterality: N/A;  . TONSILLECTOMY      Allergies  Allergen Reactions  . Hydrocodone Nausea Only  . Hydromorphone Other (See Comments)  . Oxycodone Nausea Only  . Penicillins Rash    Has patient had a PCN reaction causing immediate rash, facial/tongue/throat swelling, SOB or lightheadedness with  hypotension: NO Has patient had a PCN reaction causing severe rash involving mucus membranes or skin necrosis:NO Has patient had a PCN reaction that required hospitalization NO Has patient had a PCN reaction occurring within the last 10 years: NO If all of the above answers are "NO", then may proceed with Cephalosporin use.   [facility] Cross Reactors Rash    Current Outpatient Prescriptions  Medication Sig Dispense Refill  . Ascorbic Acid (VITAMIN C) 100 MG tablet Take 100 mg by mouth daily.    Marland Kitchen  aspirin EC 81 MG EC tablet Take 1 tablet (81 mg total) by mouth daily.    Marland Kitchen atorvastatin (LIPITOR) 40 MG tablet Take 40 mg by mouth daily.      . Calcium Carbonate-Vitamin D (CALCIUM-D) 600-400 MG-UNIT TABS Take 1 tablet by mouth daily.     . Cholecalciferol (D-3-5) 5000 UNITS capsule Take 5,000 Units by mouth daily.    . clopidogrel (PLAVIX) 75 MG tablet Take 1 tablet (75 mg total) by mouth daily. 1 tablet 0  . dexlansoprazole (DEXILANT) 60 MG capsule Take 60 mg by mouth daily.    Marland Kitchen glimepiride (AMARYL) 2 MG tablet Take 2 mg by mouth daily before breakfast.      . Hydrocortisone Acetate (PROCTOSOL RE) Place rectally.    . IRON, FERROUS GLUCONATE, PO Take 40 mg by mouth 2 (two) times daily.    . Liraglutide (VICTOZA [place]) Inject 1.8 mg into the skin daily.     Marland Kitchen losartan-hydrochlorothiazide (HYZAAR) 50-12.5 MG tablet TAKE 1 TABLET BY MOUTH EVERY DAY 90 tablet 1  . metFORMIN (GLUCOPHAGE-XR) 500 MG 24 hr tablet Take 2 tablets (1,000 mg total) by mouth 2 (two) times daily.    . metoprolol (LOPRESSOR) 50 MG tablet Take 50 mg by mouth 2 (two) times daily.  11  . Multiple Vitamin (MULTIVITAMIN) capsule Take 1 capsule by mouth daily.      . traMADol (ULTRAM) 50 MG tablet Take 1 tablet (50 mg total) by mouth every 6 (six) hours as needed for moderate pain. (Patient not taking: Reported on 09/09/2016) 30 tablet 0   No current facility-administered medications for this visit.      REVIEW OF SYSTEMS:  See HPI for pertinent positives and negatives.  Physical Examination Vitals:   09/09/16 1309 09/09/16 1313  BP: 124/70 (!) 146/83  Pulse: 73 73  Resp: 18   Temp: 97.1 F (36.2 C)   Weight: 210 lb (95.3 kg)   Height: 6' (1.829 m)    Body mass index is 28.48 kg/m.  General:  WDWN in NAD Gait: Normal HENT: WNL Eyes: Pupils equal Pulmonary: normal non-labored breathing, good air movement in all fields, no rales, rhonchi,or wheezing Cardiac: RRR, no murmur detected  Abdomen: soft, NT, no palpable masses. Skin: no rashes, no ulcers, no cellulitis.  VASCULAR EXAM  Carotid Bruits Left Right   Negative Negative  Radial pulses are 2+ palpable and =. Aorta is not palpable.   VASCULAR EXAM: Extremities without ischemic changes  without Gangrene; without open wounds; feet appear well perfused.     LE Pulses LEFT RIGHT   FEMORAL  palpable  not palpable    POPLITEAL not palpable  not palpable   POSTERIOR TIBIAL not palpable  not palpable    DORSALIS PEDIS  ANTERIOR TIBIAL not palpable  faintly palpable     Musculoskeletal: no muscle wasting or atrophy; no peripheral edema Neurologic: A&O X 3; Appropriate Affect ;  SENSATION: normal; MOTOR FUNCTION: 5/5 Symmetric, CN 2-12 intact except for some hearing loss, Speech is fluent/normal     ASSESSMENT:  Bradley [name] is a 71 y.o. male who is s/p aortobifemoral bypass for lower extremity arterial occlusive disease in 1998. He underwent a left carotid endarterectomy in 1994 with a redo for recurrent stenosis in 2001. He had a stroke at age 73, then subsequent TIA's until his last TIA in 2001. He has no claudication with walking, no non healing wounds.  His  atherosclerotic risk factors include DM that is almost in control  and former smoker.  DATA  Today's carotid Duplex suggests 40-59% right ICA stenosis and left carotid endarterectomy site with <40% stenosis at the proximal patch site. Right vertebral artery is antegrade, left vertebral artery is not visualized.  Bilateral subclavian arteries are normal.  No significant change in comparison to the last exam on 09-05-15.  ABI's: Right: 0.59 ( declined from 0.75 on 09-05-15), waveforms: monophasic; TBI: 0.43 (declined from 0.60) Left: 0.67 (slight decline from 0.70), waveforms: monophasic, TBI: 0.51 (was 0.54) Slight decline in bilateral ABI's and TBI's.   PLAN:   Continue graduated walking program. Based on today's exam and non-invasive vascular lab results, the patient will follow up in 1 year with the following tests: ABI's and carotid duplex. I advised him to notify us if he develops non healing wounds in his feet/legs or his calves feel worse with walking.   I discussed in depth with the patient the nature of atherosclerosis, and emphasized the importance of maximal medical management including strict control of blood pressure, blood glucose, and lipid levels, obtaining regular exercise, and cessation of smoking.  The patient is aware that without maximal medical management the underlying atherosclerotic disease process will progress, limiting the benefit of any interventions.  The patient was given information about stroke prevention and what symptoms should prompt the patient to seek immediate medical care.  The patient was given information about PAD including signs, symptoms, treatment, what symptoms should prompt the patient to seek immediate medical care, and risk reduction measures to take. Thank you for allowing [place] to participate in this patient's care.  Clemon Chambers, RN, MSN, FNP-C [facility] Office: [phone]  Clinic MD: Early 09/09/2016 1:19 PM

## 2016-09-09 NOTE — Patient Instructions (Signed)
Stroke Prevention Some medical conditions and behaviors are associated with an increased chance of having a stroke. You may prevent a stroke by making healthy choices and managing medical conditions. How can I reduce my risk of having a stroke?  Stay physically active. Get at least 30 minutes of activity on most or all days.  Do not smoke. It may also be helpful to avoid exposure to secondhand smoke.  Limit alcohol use. Moderate alcohol use is considered to be:  No more than 2 drinks per day for men.  No more than 1 drink per day for nonpregnant women.  Eat healthy foods. This involves:  Eating 5 or more servings of fruits and vegetables a day.  Making dietary changes that address high blood pressure (hypertension), high cholesterol, diabetes, or obesity.  Manage your cholesterol levels.  Making food choices that are high in fiber and low in saturated fat, trans fat, and cholesterol may control cholesterol levels.  Take any prescribed medicines to control cholesterol as directed by your health care provider.  Manage your diabetes.  Controlling your carbohydrate and sugar intake is recommended to manage diabetes.  Take any prescribed medicines to control diabetes as directed by your health care provider.  Control your hypertension.  Making food choices that are low in salt (sodium), saturated fat, trans fat, and cholesterol is recommended to manage hypertension.  Ask your health care provider if you need treatment to lower your blood pressure. Take any prescribed medicines to control hypertension as directed by your health care provider.  If you are 18-39 years of age, have your blood pressure checked every 3-5 years. If you are 40 years of age or older, have your blood pressure checked every year.  Maintain a healthy weight.  Reducing calorie intake and making food choices that are low in sodium, saturated fat, trans fat, and cholesterol are recommended to manage  weight.  Stop drug abuse.  Avoid taking birth control pills.  Talk to your health care provider about the risks of taking birth control pills if you are over 35 years old, smoke, get migraines, or have ever had a blood clot.  Get evaluated for sleep disorders (sleep apnea).  Talk to your health care provider about getting a sleep evaluation if you snore a lot or have excessive sleepiness.  Take medicines only as directed by your health care provider.  For some people, aspirin or blood thinners (anticoagulants) are helpful in reducing the risk of forming abnormal blood clots that can lead to stroke. If you have the irregular heart rhythm of atrial fibrillation, you should be on a blood thinner unless there is a good reason you cannot take them.  Understand all your medicine instructions.  Make sure that other conditions (such as anemia or atherosclerosis) are addressed. Get help right away if:  You have sudden weakness or numbness of the face, arm, or leg, especially on one side of the body.  Your face or eyelid droops to one side.  You have sudden confusion.  You have trouble speaking (aphasia) or understanding.  You have sudden trouble seeing in one or both eyes.  You have sudden trouble walking.  You have dizziness.  You have a loss of balance or coordination.  You have a sudden, severe headache with no known cause.  You have new chest pain or an irregular heartbeat. Any of these symptoms may represent a serious problem that is an emergency. Do not wait to see if the symptoms will go away.   Get medical help at once. Call your local emergency services (911 in [place] Do not drive yourself to the hospital.  This information is not intended to replace advice given to you by your health care provider. Make sure you discuss any questions you have with your health care provider. Document Released: 09/04/2004 Document Revised: 01/03/2016 Document Reviewed: 01/28/2013 Elsevier  Interactive Patient Education  2017 Elsevier Inc.      Peripheral Vascular Disease Peripheral vascular disease (PVD) is a disease of the blood vessels that are not part of your heart and brain. A simple term for PVD is poor circulation. In most cases, PVD narrows the blood vessels that carry blood from your heart to the rest of your body. This can result in a decreased supply of blood to your arms, legs, and internal organs, like your stomach or kidneys. However, it most often affects a person's lower legs and feet. There are two types of PVD.  Organic PVD. This is the more common type. It is caused by damage to the structure of blood vessels.  Functional PVD. This is caused by conditions that make blood vessels contract and tighten (spasm). Without treatment, PVD tends to get worse over time. PVD can also lead to acute ischemic limb. This is when an arm or limb suddenly has trouble getting enough blood. This is a medical emergency. Follow these instructions at home:  Take medicines only as told by your doctor.  Do not use any tobacco products, including cigarettes, chewing tobacco, or electronic cigarettes. If you need help quitting, ask your doctor.  Lose weight if you are overweight, and maintain a healthy weight as told by your doctor.  Eat a diet that is low in fat and cholesterol. If you need help, ask your doctor.  Exercise regularly. Ask your doctor for some good activities for you.  Take good care of your feet.  Wear comfortable shoes that fit well.  Check your feet often for any cuts or sores. Contact a doctor if:  You have cramps in your legs while walking.  You have leg pain when you are at rest.  You have coldness in a leg or foot.  Your skin changes.  You are unable to get or have an erection (erectile dysfunction).  You have cuts or sores on your feet that are not healing. Get help right away if:  Your arm or leg turns cold and blue.  Your arms or legs  become red, warm, swollen, painful, or numb.  You have chest pain or trouble breathing.  You suddenly have weakness in your face, arm, or leg.  You become very confused or you cannot speak.  You suddenly have a very bad headache.  You suddenly cannot see. This information is not intended to replace advice given to you by your health care provider. Make sure you discuss any questions you have with your health care provider. Document Released: 10/22/2009 Document Revised: 01/03/2016 Document Reviewed: 01/05/2014 Elsevier Interactive Patient Education  2017 Elsevier Inc.  

## 2016-09-10 DIAGNOSIS — G4733 Obstructive sleep apnea (adult) (pediatric): Secondary | ICD-10-CM | POA: Diagnosis not present

## 2016-09-11 ENCOUNTER — Other Ambulatory Visit: Payer: Self-pay | Admitting: Surgery

## 2016-09-11 DIAGNOSIS — K811 Chronic cholecystitis: Secondary | ICD-10-CM | POA: Diagnosis not present

## 2016-09-11 DIAGNOSIS — K8010 Calculus of gallbladder with chronic cholecystitis without obstruction: Secondary | ICD-10-CM | POA: Diagnosis not present

## 2016-09-11 HISTORY — PX: LAPAROSCOPIC CHOLECYSTECTOMY: SUR755

## 2016-09-12 NOTE — Addendum Note (Signed)
Addended by: Lianne Cure A on: 09/12/2016 04:28 PM   Modules accepted: Orders

## 2016-09-17 DIAGNOSIS — H40013 Open angle with borderline findings, low risk, bilateral: Secondary | ICD-10-CM | POA: Diagnosis not present

## 2016-09-17 DIAGNOSIS — H401130 Primary open-angle glaucoma, bilateral, stage unspecified: Secondary | ICD-10-CM | POA: Diagnosis not present

## 2016-09-17 DIAGNOSIS — H2513 Age-related nuclear cataract, bilateral: Secondary | ICD-10-CM | POA: Diagnosis not present

## 2016-10-02 DIAGNOSIS — M65322 Trigger finger, left index finger: Secondary | ICD-10-CM | POA: Diagnosis not present

## 2016-10-02 DIAGNOSIS — M65341 Trigger finger, right ring finger: Secondary | ICD-10-CM | POA: Diagnosis not present

## 2016-10-02 DIAGNOSIS — M65331 Trigger finger, right middle finger: Secondary | ICD-10-CM | POA: Diagnosis not present

## 2016-10-17 ENCOUNTER — Telehealth: Payer: Self-pay | Admitting: Interventional Cardiology

## 2016-10-17 NOTE — Telephone Encounter (Signed)
Patient complaining of SOB and some chest pressure for the last week. Right now patient denies symptoms. Patient stated his symptoms are worse when sitting. Patient stated he feels fine with walking, etc. Patient reported his BP 120/65 and HR 72. Patient stated he is not sure if this is due to his recent gall bladder surgery. Encouraged patient to go to ED if symptoms return or worsen. Informed patient that he should keep his appointment on Tuesday to get evaluated. Consulted DOD Dr. Saunders Revel and he agreed to plan.

## 2016-10-17 NOTE — Telephone Encounter (Signed)
New message  Pt c/o Shortness Of Breath: STAT if SOB developed within the last 24 hours or pt is noticeably SOB on the phone  1. Are you currently SOB (can you hear that pt is SOB on the phone)? No   2. How long have you been experiencing SOB? About a week  3. Are you SOB when sitting or when up moving around? Moving around   4. Are you currently experiencing any other symptoms? Per pt feels like pressure on chest.   appt was made for pt on 2/13 K [place]ompson 1:30. Please call back to discuss

## 2016-10-20 NOTE — Progress Notes (Signed)
Cardiology Office Note    Date:  10/21/2016   ID:  CROCKETT RALLO, DOB Jan 10, 1946, MRN [id number]  PCP:  Melinda Crutch, MD  Cardiologist:  Dr. Tamala Julian   CC: CP/SOB  History of Present Illness:  Bradley [name] is a 71 y.o. male with a history of CAD s/p CABG x3V, HTN, HLD, OSA, PAD with L CEA x2, OSA on CPAP, and DMT2 who presents to clinic for evaluation of chest pain and SOB.  He has a history of PAD. He undergone prior aortobifemoral bypass for lower extremity arterial occlusive disease in 1998. He underwent a left carotid endarterectomy in 1994 with a redo for recurrent stenosis in 2001.  He has a history of CAD s/p CABG x3V (LIMA to the LAD, SVG to the diagonal, SVG to PDA) in 05/2015. He did have some post op afib with no recurrence.   He was last seen by Dr. Tamala Julian on 08/18/16 for pre op clearance prior to laparoscopic cholecysectomy. The was some question on whether he was on plavix or not. This had been prescribed by vascular surgery. He was cleared for gallbladder removal, which was successfully completed on 09/11/16.   Today he presents to clinic for evaluation of SOB and chest pressure. About two weeks ago he noticed a pain in his left chest. Its worse when pressing down with left arm and when presses on it. He also had some chest pressure that is different and comes and goes. It reminds him of reflux. It gets better when he burps. The chest pressure is not related to exertion. He is also having worsening SOB that comes on with both exertion or rest. All of a sudden he feels like he gasps for breath. Not position related (no orthopnea or PND). He has had intermittent LE edema that has been chronic since his bypass surgery. No recent long car trips. No dizziness or syncope. No blood in stool or urine.     Past Medical History:  Diagnosis Date  . Anemia   . Arthritis   . Carotid artery occlusion    a. Carotid US 10/16: RICA 60-79%, L CEA patent with 1-39% stenosis  . Coronary artery  disease    a. Myoview 9/16:  EF 52%, inferior fixed defect consistent with diaphragmatic attenuation, intermediate risk secondary to poor exercise tolerance and symptoms during stress;  b. LHC 05/17/2015 90% mid LAD, 80% ost D2, 75% mid RCA, 35% prox RCA. >> S/p CABG  . Diabetes mellitus age 23  . Diverticulitis   . GERD (gastroesophageal reflux disease)   . H/O hiatal hernia   . History of echocardiogram    a. Echo 9/16: GLS -15.2%, EF 78-29%, grade 1 diastolic dysfunction, normal wall motion, aortic sclerosis, dilated aortic root 39 mm, atrial septal lipomatous hypertrophy  . Hyperlipidemia   . Hypertension   . Irritable bowel syndrome (IBS)   . Myocardial infarction    silent inferior MI; patient denies MI history (03/17/13)   . Neuropathy ([place]) 2013  . Osteoporosis 2013  . PAF (paroxysmal atrial fibrillation) ([place])    post CABG; Amiodarone stopped 2/2 wheezing;   . Peripheral vascular disease ([place])   . Reflux esophagitis   . Sleep apnea    uses CPAP  . Stroke [facility]) 1987   Right brain stroke  . Wears glasses     Past Surgical History:  Procedure Laterality Date  . CARDIAC CATHETERIZATION N/A 05/17/2015   Procedure: Left Heart Cath and Coronary Angiography;  Surgeon: Mallie Mussel  Nicholes Stairs, MD;  Location: [facility];  Service: Cardiovascular;  Laterality: N/A;  . CAROTID ENDARTERECTOMY  1994 & redo 2001   Left  . COLONOSCOPY W/ BIOPSIES AND POLYPECTOMY    . CORONARY ARTERY BYPASS GRAFT N/A 05/23/2015   Procedure: CORONARY ARTERY BYPASS GRAFTING (CABG) x 3 (LIMA to LAD, SVG to DIAGONAL 2, SVG to PDA) with Endoscopic Vein Harvesting from right greater saphenous vein;  Surgeon: Grace Isaac, MD;  Location: [facility];  Service: Open Heart Surgery;  Laterality: N/A;  . FRACTURE SURGERY  2013   Right   foo  t X's 2  . ILIAC ARTERY STENT    . POSTERIOR LUMBAR FUSION  Aug. 14, 2014   Level 1  . PR VEIN BYPASS GRAFT,AORTO-FEM-POP  1998  . [place] SURGERY  2014  . TEE WITHOUT  CARDIOVERSION N/A 05/23/2015   Procedure: TRANSESOPHAGEAL ECHOCARDIOGRAM (TEE);  Surgeon: Grace Isaac, MD;  Location: [facility];  Service: Open Heart Surgery;  Laterality: N/A;  . TONSILLECTOMY      Current Medications: Outpatient Medications Prior to Visit  Medication Sig Dispense Refill  . Ascorbic Acid (VITAMIN C) 100 MG tablet Take 100 mg by mouth daily.    Marland Kitchen aspirin EC 81 MG EC tablet Take 1 tablet (81 mg total) by mouth daily.    Marland Kitchen atorvastatin (LIPITOR) 40 MG tablet Take 40 mg by mouth daily.      . Calcium Carbonate-Vitamin D (CALCIUM-D) 600-400 MG-UNIT TABS Take 1 tablet by mouth daily.     . Cholecalciferol (D-3-5) 5000 UNITS capsule Take 5,000 Units by mouth daily.    . clopidogrel (PLAVIX) 75 MG tablet Take 1 tablet (75 mg total) by mouth daily. 1 tablet 0  . dexlansoprazole (DEXILANT) 60 MG capsule Take 60 mg by mouth daily.    Marland Kitchen glimepiride (AMARYL) 2 MG tablet Take 2 mg by mouth daily before breakfast.      . Hydrocortisone Acetate (PROCTOSOL RE) Place rectally.    . IRON, FERROUS GLUCONATE, PO Take 40 mg by mouth 2 (two) times daily.    Marland Kitchen losartan-hydrochlorothiazide (HYZAAR) 50-12.5 MG tablet TAKE 1 TABLET BY MOUTH EVERY DAY 90 tablet 1  . metFORMIN (GLUCOPHAGE-XR) 500 MG 24 hr tablet Take 2 tablets (1,000 mg total) by mouth 2 (two) times daily.    . metoprolol (LOPRESSOR) 50 MG tablet Take 50 mg by mouth 2 (two) times daily.  11  . Multiple Vitamin (MULTIVITAMIN) capsule Take 1 capsule by mouth daily.      . Liraglutide (VICTOZA [place]) Inject 1.8 mg into the skin daily.     . traMADol (ULTRAM) 50 MG tablet Take 1 tablet (50 mg total) by mouth every 6 (six) hours as needed for moderate pain. (Patient not taking: Reported on 09/09/2016) 30 tablet 0   No facility-administered medications prior to visit.      Allergies:   Hydrocodone; Hydromorphone; Oxycodone; Penicillins; and Sulfa drugs cross reactors   Social History   Social History  . Marital status: Married     Spouse name: N/A  . Number of children: N/A  . Years of education: N/A   Social History Main Topics  . Smoking status: Former Smoker    Packs/day: 2.00    Years: 40.00    Types: Cigarettes    Quit date: 03/26/2009  . Smokeless tobacco: Never Used  . Alcohol use No  . Drug use: No  . Sexual activity: Not Asked   Other Topics Concern  .  None   Social History Narrative  . None     Family History:  The patient's family history includes Cancer (age of onset: 28) in his mother; Deep vein thrombosis in his father; Hyperlipidemia in his father and mother; Hypertension in his father; Stroke (age of onset: 19) in his father.      ROS:   Please see the history of present illness.    ROS All other systems reviewed and are negative.   PHYSICAL EXAM:   VS:  BP 136/70 (BP Location: Right Arm, Patient Position: Sitting, Cuff Size: Normal)   Pulse 79   Ht 6' (1.829 m)   Wt 207 lb (93.9 kg)   BMI 28.07 kg/m    GEN: Well nourished, well developed, in no acute distress, obese HEENT: normal  Neck: no JVD, carotid bruits, or masses Cardiac: RRR; no murmurs, rubs, or gallops,no edema  Respiratory:  clear to auscultation bilaterally, normal work of breathing GI: soft, nontender, nondistended, + BS MS: no deformity or atrophy  Skin: warm and dry, no rash Neuro:  Alert and Oriented x 3, Strength and sensation are intact Psych: euthymic mood, full affect    Wt Readings from Last 3 Encounters:  10/21/16 207 lb (93.9 kg)  09/09/16 210 lb (95.3 kg)  08/18/16 216 lb (98 kg)      Studies/Labs Reviewed:   EKG:  EKG is ordered today.  The ekg ordered today demonstrates HR 79, RBBB, inferior Q waves  Recent Labs: No results found for requested labs within last 8760 hours.   Lipid Panel    Component Value Date/Time   CHOL 82 06/01/2015 0315   TRIG 158 (H) 06/01/2015 0315   HDL 24 (L) 06/01/2015 0315   CHOLHDL 3.4 06/01/2015 0315   VLDL 32 06/01/2015 0315   LDLCALC 26 06/01/2015  0315    Additional studies/ records that were reviewed today include:  Cardiac catheterization 2016: Diagnostic Diagram      Cardiac catheterization was followed by coronary bypass grafting: October 2016: LIMA to LAD, SVG to diagonal, and SVG to the PDA.    ASSESSMENT & PLAN:   CP/SOB: some of his chest pain is very atypical and almost certainly non cardiac (worse with certain arm movements and reproducible on palpation). He has a separate sx of chest pressure that is not exertional and better with belching. This could be GERD, but will update nuclear stress test given CAD history. He also has some SOB that is not worse in a supine position and related with exertion and rest. Comes on sporadically and feels like he is gasping. Will check a BNP to rule out occult CHF (although appears euvolemic on exam), Ddimer (given recent surgery), CBC and BMET.   CAD s/p CABG: see above. Continue ASA/plavix, statin and BB.   HTN: BP well controlled currently  OSA: complaint with CPAP  RBBB: stable  PAF: brief post op with no recurrence. NSR today.   PAD: continue ASA/plavix and statin .  Medication Adjustments/Labs and Tests Ordered: Current medicines are reviewed at length with the patient today.  Concerns regarding medicines are outlined above.  Medication changes, Labs and Tests ordered today are listed in the Patient Instructions below. Patient Instructions  Medication Instructions:  Your physician recommends that you continue on your current medications as directed. Please refer to the Current Medication list given to you today.   Labwork: TODAY:  PRO BNP, DDIMER, CBC, & BMET  Testing/Procedures: Your physician has requested that you have en exercise  stress myoview. For further information please visit [email or web address]. Please follow instruction sheet, as given.   Follow-Up: Your physician recommends that you schedule a follow-up appointment in: PENDING TEST RESULTS   Any  Other Special Instructions Will Be Listed Below (If Applicable).   Exercise Stress Electrocardiogram An exercise stress electrocardiogram is a test to check how blood flows to your heart. It is done to find areas of poor blood flow. You will need to walk on a treadmill for this test. The electrocardiogram will record your heartbeat when you are at rest and when you are exercising. What happens before the procedure?  Do not have drinks with caffeine or foods with caffeine for 24 hours before the test, or as told by your doctor. This includes coffee, tea (even decaf tea), sodas, chocolate, and cocoa.  Follow your doctor's instructions about eating and drinking before the test.  Ask your doctor what medicines you should or should not take before the test. Take your medicines with water unless told by your doctor not to.  If you use an inhaler, bring it with you to the test.  Bring a snack to eat after the test.  Do not  smoke for 4 hours before the test.  Do not put lotions, powders, creams, or oils on your chest before the test.  Wear comfortable shoes and clothing. What happens during the procedure?  You will have patches put on your chest. Small areas of your chest may need to be shaved. Wires will be connected to the patches.  Your heart rate will be watched while you are resting and while you are exercising.  You will walk on the treadmill. The treadmill will slowly get faster to raise your heart rate.  The test will take about 1-2 hours. What happens after the procedure?  Your heart rate and blood pressure will be watched after the test.  You may return to your normal diet, activities, and medicines or as told by your doctor. This information is not intended to replace advice given to you by your health care provider. Make sure you discuss any questions you have with your health care provider. Document Released: 01/14/2008 Document Revised: 03/26/2016 Document Reviewed:  04/04/2013 Elsevier Interactive Patient Education  2017 [place], Angelena Form, [place]  10/21/2016 2:23 PM    [address], [place], [place]  [address] Phone: [phone]; Fax: [phone]

## 2016-10-21 ENCOUNTER — Encounter: Payer: Self-pay | Admitting: *Deleted

## 2016-10-21 ENCOUNTER — Encounter: Payer: Self-pay | Admitting: Physician Assistant

## 2016-10-21 ENCOUNTER — Ambulatory Visit (INDEPENDENT_AMBULATORY_CARE_PROVIDER_SITE_OTHER): Payer: PPO | Admitting: Physician Assistant

## 2016-10-21 VITALS — BP 136/70 | HR 79 | Ht 72.0 in | Wt 207.0 lb

## 2016-10-21 DIAGNOSIS — R0789 Other chest pain: Secondary | ICD-10-CM

## 2016-10-21 DIAGNOSIS — E785 Hyperlipidemia, unspecified: Secondary | ICD-10-CM

## 2016-10-21 DIAGNOSIS — Z7689 Persons encountering health services in other specified circumstances: Secondary | ICD-10-CM | POA: Diagnosis not present

## 2016-10-21 DIAGNOSIS — I480 Paroxysmal atrial fibrillation: Secondary | ICD-10-CM | POA: Diagnosis not present

## 2016-10-21 DIAGNOSIS — G4733 Obstructive sleep apnea (adult) (pediatric): Secondary | ICD-10-CM | POA: Diagnosis not present

## 2016-10-21 DIAGNOSIS — R0602 Shortness of breath: Secondary | ICD-10-CM

## 2016-10-21 DIAGNOSIS — I10 Essential (primary) hypertension: Secondary | ICD-10-CM | POA: Diagnosis not present

## 2016-10-21 DIAGNOSIS — Z9989 Dependence on other enabling machines and devices: Secondary | ICD-10-CM | POA: Diagnosis not present

## 2016-10-21 DIAGNOSIS — I739 Peripheral vascular disease, unspecified: Secondary | ICD-10-CM

## 2016-10-21 DIAGNOSIS — I25119 Atherosclerotic heart disease of native coronary artery with unspecified angina pectoris: Secondary | ICD-10-CM | POA: Diagnosis not present

## 2016-10-21 DIAGNOSIS — I4510 Unspecified right bundle-branch block: Secondary | ICD-10-CM

## 2016-10-21 NOTE — Patient Instructions (Addendum)
Medication Instructions:  Your physician recommends that you continue on your current medications as directed. Please refer to the Current Medication list given to you today.   Labwork: TODAY:  PRO BNP, DDIMER, CBC, & BMET  Testing/Procedures: Your physician has requested that you have en exercise stress myoview. For further information please visit [email or web address]. Please follow instruction sheet, as given.   Follow-Up: Your physician recommends that you schedule a follow-up appointment in: PENDING TEST RESULTS   Any Other Special Instructions Will Be Listed Below (If Applicable).   Exercise Stress Electrocardiogram An exercise stress electrocardiogram is a test to check how blood flows to your heart. It is done to find areas of poor blood flow. You will need to walk on a treadmill for this test. The electrocardiogram will record your heartbeat when you are at rest and when you are exercising. What happens before the procedure?  Do not have drinks with caffeine or foods with caffeine for 24 hours before the test, or as told by your doctor. This includes coffee, tea (even decaf tea), sodas, chocolate, and cocoa.  Follow your doctor's instructions about eating and drinking before the test.  Ask your doctor what medicines you should or should not take before the test. Take your medicines with water unless told by your doctor not to.  If you use an inhaler, bring it with you to the test.  Bring a snack to eat after the test.  Do not  smoke for 4 hours before the test.  Do not put lotions, powders, creams, or oils on your chest before the test.  Wear comfortable shoes and clothing. What happens during the procedure?  You will have patches put on your chest. Small areas of your chest may need to be shaved. Wires will be connected to the patches.  Your heart rate will be watched while you are resting and while you are exercising.  You will walk on the treadmill. The treadmill  will slowly get faster to raise your heart rate.  The test will take about 1-2 hours. What happens after the procedure?  Your heart rate and blood pressure will be watched after the test.  You may return to your normal diet, activities, and medicines or as told by your doctor. This information is not intended to replace advice given to you by your health care provider. Make sure you discuss any questions you have with your health care provider. Document Released: 01/14/2008 Document Revised: 03/26/2016 Document Reviewed: 04/04/2013 Elsevier Interactive Patient Education  2017 [employer].

## 2016-10-22 ENCOUNTER — Other Ambulatory Visit (INDEPENDENT_AMBULATORY_CARE_PROVIDER_SITE_OTHER): Payer: PPO | Admitting: *Deleted

## 2016-10-22 ENCOUNTER — Telehealth (HOSPITAL_COMMUNITY): Payer: Self-pay | Admitting: *Deleted

## 2016-10-22 ENCOUNTER — Telehealth: Payer: Self-pay | Admitting: *Deleted

## 2016-10-22 ENCOUNTER — Other Ambulatory Visit: Payer: Self-pay | Admitting: Physician Assistant

## 2016-10-22 DIAGNOSIS — E785 Hyperlipidemia, unspecified: Secondary | ICD-10-CM | POA: Diagnosis not present

## 2016-10-22 DIAGNOSIS — G4733 Obstructive sleep apnea (adult) (pediatric): Secondary | ICD-10-CM | POA: Diagnosis not present

## 2016-10-22 DIAGNOSIS — R0602 Shortness of breath: Secondary | ICD-10-CM

## 2016-10-22 DIAGNOSIS — R0789 Other chest pain: Secondary | ICD-10-CM | POA: Diagnosis not present

## 2016-10-22 DIAGNOSIS — R7989 Other specified abnormal findings of blood chemistry: Secondary | ICD-10-CM

## 2016-10-22 DIAGNOSIS — I480 Paroxysmal atrial fibrillation: Secondary | ICD-10-CM

## 2016-10-22 DIAGNOSIS — I25119 Atherosclerotic heart disease of native coronary artery with unspecified angina pectoris: Secondary | ICD-10-CM | POA: Diagnosis not present

## 2016-10-22 DIAGNOSIS — I10 Essential (primary) hypertension: Secondary | ICD-10-CM

## 2016-10-22 DIAGNOSIS — Z9989 Dependence on other enabling machines and devices: Secondary | ICD-10-CM | POA: Diagnosis not present

## 2016-10-22 DIAGNOSIS — I739 Peripheral vascular disease, unspecified: Secondary | ICD-10-CM | POA: Diagnosis not present

## 2016-10-22 DIAGNOSIS — I4510 Unspecified right bundle-branch block: Secondary | ICD-10-CM | POA: Diagnosis not present

## 2016-10-22 LAB — BASIC METABOLIC PANEL
BUN/Creatinine Ratio: 15 (ref 10–24)
BUN: 29 mg/dL — ABNORMAL HIGH (ref 8–27)
CO2: 22 mmol/L (ref 18–29)
Calcium: 9.6 mg/dL (ref 8.6–10.2)
Chloride: 99 mmol/L (ref 96–106)
Creatinine, Ser: 1.92 mg/dL — ABNORMAL HIGH (ref 0.76–1.27)
GFR calc Af Amer: 39 mL/min/{1.73_m2} — ABNORMAL LOW (ref >59)
GFR calc non Af Amer: 34 mL/min/{1.73_m2} — ABNORMAL LOW (ref >59)
Glucose: 205 mg/dL — ABNORMAL HIGH (ref 65–99)
Potassium: 4.6 mmol/L (ref 3.5–5.2)
Sodium: 142 mmol/L (ref 134–144)

## 2016-10-22 LAB — CBC
Hematocrit: 45.7 % (ref 37.5–51.0)
Hemoglobin: 16.3 g/dL (ref 13.0–17.7)
MCH: 33.7 pg — ABNORMAL HIGH (ref 26.6–33.0)
MCHC: 35.7 g/dL (ref 31.5–35.7)
MCV: 94 fL (ref 79–97)
Platelets: 272 10*3/uL (ref 150–379)
RBC: 4.84 x10E6/uL (ref 4.14–5.80)
RDW: 13.5 % (ref 12.3–15.4)
WBC: 8.7 10*3/uL (ref 3.4–10.8)

## 2016-10-22 LAB — SPECIMEN STATUS

## 2016-10-22 LAB — D-DIMER, QUANTITATIVE: D-DIMER: 1.27 mg/L FEU — ABNORMAL HIGH (ref 0.00–0.49)

## 2016-10-22 LAB — PRO B NATRIURETIC PEPTIDE: NT-Pro BNP: 124 pg/mL (ref 0–376)

## 2016-10-22 NOTE — Telephone Encounter (Signed)
Called pt re: elevated ddimer to let him know that we will order a VQ scan to r/o PE.  Pt verbalized understanding and will await on scheduling dept to call.

## 2016-10-22 NOTE — Addendum Note (Signed)
Addended by: Eulis Foster on: 10/22/2016 11:09 AM   Modules accepted: Orders

## 2016-10-22 NOTE — Telephone Encounter (Signed)
Called pt per lab results and the lab had messed up his Ddimer so I offered the pt to go to [facility] for convenience to the pt, but pt would like to come back to [facility]. Order put in.

## 2016-10-22 NOTE — Addendum Note (Signed)
Addended by: Gaetano Net on: 10/22/2016 03:43 PM   Modules accepted: Orders

## 2016-10-22 NOTE — Addendum Note (Signed)
Addended by: Eulis Foster on: 10/22/2016 11:10 AM   Modules accepted: Orders

## 2016-10-22 NOTE — Telephone Encounter (Signed)
Patient given detailed instructions per Myocardial Perfusion Study Information Sheet for the test on 10/27/16 at 1000. Patient notified to arrive 15 minutes early and that it is imperative to arrive on time for appointment to keep from having the test rescheduled.  If you need to cancel or reschedule your appointment, please call the office within 24 hours of your appointment. Failure to do so may result in a cancellation of your appointment, and a $50 no show fee. Patient verbalized understanding.Bradley [name], Ranae Palms

## 2016-10-23 ENCOUNTER — Telehealth: Payer: Self-pay | Admitting: Interventional Cardiology

## 2016-10-23 NOTE — Telephone Encounter (Signed)
New Message  Pt voiced wanting to speak to nurse in regards to upcoming appts.  Please f/u

## 2016-10-23 NOTE — Telephone Encounter (Signed)
Pt wanted to know if he should continue with plan for stress test on Monday since there is concern for PE.  Pt having VQ scan tomorrow to r/o PE.  Advised pt a decision will be made once VQ scan is completed.  Pt appreciative for call.

## 2016-10-24 ENCOUNTER — Encounter (HOSPITAL_COMMUNITY)
Admission: RE | Admit: 2016-10-24 | Discharge: 2016-10-24 | Disposition: A | Discharge status: Home / Self Care | Payer: PPO | Source: Ambulatory Visit | Attending: Physician Assistant | Admitting: Physician Assistant

## 2016-10-24 ENCOUNTER — Ambulatory Visit (HOSPITAL_COMMUNITY)
Admission: RE | Admit: 2016-10-24 | Discharge: 2016-10-24 | Disposition: A | Discharge status: Home / Self Care | Payer: PPO | Source: Ambulatory Visit | Attending: Physician Assistant | Admitting: Physician Assistant

## 2016-10-24 DIAGNOSIS — R791 Abnormal coagulation profile: Secondary | ICD-10-CM | POA: Diagnosis not present

## 2016-10-24 DIAGNOSIS — R7989 Other specified abnormal findings of blood chemistry: Secondary | ICD-10-CM | POA: Insufficient documentation

## 2016-10-24 MED ORDER — TECHNETIUM TC 99M DIETHYLENETRIAME-PENTAACETIC ACID
32.5000 | Freq: Once | INTRAVENOUS | Status: AC | PRN
  Administered 2016-10-24: 32.5 via INTRAVENOUS

## 2016-10-24 MED ORDER — TECHNETIUM TO 99M ALBUMIN AGGREGATED
4.3100 | Freq: Once | INTRAVENOUS | Status: AC | PRN
  Administered 2016-10-24: 4.31 via INTRAVENOUS

## 2016-10-27 ENCOUNTER — Ambulatory Visit (HOSPITAL_COMMUNITY): Payer: PPO | Attending: Cardiovascular Disease

## 2016-10-27 DIAGNOSIS — Z8673 Personal history of transient ischemic attack (TIA), and cerebral infarction without residual deficits: Secondary | ICD-10-CM | POA: Diagnosis not present

## 2016-10-27 DIAGNOSIS — I739 Peripheral vascular disease, unspecified: Secondary | ICD-10-CM | POA: Diagnosis not present

## 2016-10-27 DIAGNOSIS — E785 Hyperlipidemia, unspecified: Secondary | ICD-10-CM

## 2016-10-27 DIAGNOSIS — I2510 Atherosclerotic heart disease of native coronary artery without angina pectoris: Secondary | ICD-10-CM | POA: Diagnosis not present

## 2016-10-27 DIAGNOSIS — G4733 Obstructive sleep apnea (adult) (pediatric): Secondary | ICD-10-CM | POA: Diagnosis not present

## 2016-10-27 DIAGNOSIS — E1151 Type 2 diabetes mellitus with diabetic peripheral angiopathy without gangrene: Secondary | ICD-10-CM | POA: Insufficient documentation

## 2016-10-27 DIAGNOSIS — I480 Paroxysmal atrial fibrillation: Secondary | ICD-10-CM

## 2016-10-27 DIAGNOSIS — R0789 Other chest pain: Secondary | ICD-10-CM | POA: Diagnosis not present

## 2016-10-27 DIAGNOSIS — R0602 Shortness of breath: Secondary | ICD-10-CM | POA: Insufficient documentation

## 2016-10-27 DIAGNOSIS — I10 Essential (primary) hypertension: Secondary | ICD-10-CM | POA: Diagnosis not present

## 2016-10-27 DIAGNOSIS — Z9989 Dependence on other enabling machines and devices: Secondary | ICD-10-CM

## 2016-10-27 DIAGNOSIS — I4510 Unspecified right bundle-branch block: Secondary | ICD-10-CM | POA: Diagnosis not present

## 2016-10-27 DIAGNOSIS — I25119 Atherosclerotic heart disease of native coronary artery with unspecified angina pectoris: Secondary | ICD-10-CM | POA: Diagnosis not present

## 2016-10-27 DIAGNOSIS — R0609 Other forms of dyspnea: Secondary | ICD-10-CM | POA: Insufficient documentation

## 2016-10-27 LAB — MYOCARDIAL PERFUSION IMAGING
Estimated workload: 7 METS
Exercise duration (min): 5 min
Exercise duration (sec): 40 s
LV dias vol: 70 mL (ref 62–150)
LV sys vol: 31 mL
MPHR: 148 {beats}/min
Peak HR: 129 {beats}/min
Percent HR: 87 %
RATE: 0.27
RPE: 18
Rest HR: 87 {beats}/min
SDS: 6
SRS: 0
SSS: 6
TID: 0.75

## 2016-10-27 MED ORDER — TECHNETIUM TC 99M TETROFOSMIN IV KIT
33.0000 | PACK | Freq: Once | INTRAVENOUS | Status: AC | PRN
  Administered 2016-10-27: 33 via INTRAVENOUS
  Filled 2016-10-27: qty 33

## 2016-10-27 MED ORDER — TECHNETIUM TC 99M TETROFOSMIN IV KIT
10.2000 | PACK | Freq: Once | INTRAVENOUS | Status: AC | PRN
  Administered 2016-10-27: 10.2 via INTRAVENOUS
  Filled 2016-10-27: qty 11

## 2016-10-28 ENCOUNTER — Telehealth: Payer: Self-pay | Admitting: Interventional Cardiology

## 2016-10-28 NOTE — Telephone Encounter (Signed)
Spoke with pt and scheduled him to see Dr. Tamala Julian 12/22/16.  Pt appreciative for assistance.

## 2016-10-28 NOTE — Telephone Encounter (Signed)
New Message     Pt is returning Jennifer's call

## 2016-10-30 LAB — PT AND PTT
INR: 1 (ref 0.8–1.2)
Prothrombin Time: 10.4 s (ref 9.1–12.0)
aPTT: 25 s (ref 24–33)

## 2016-10-30 LAB — SPECIMEN STATUS REPORT

## 2016-11-04 DIAGNOSIS — M65341 Trigger finger, right ring finger: Secondary | ICD-10-CM | POA: Diagnosis not present

## 2016-11-04 DIAGNOSIS — M65331 Trigger finger, right middle finger: Secondary | ICD-10-CM | POA: Diagnosis not present

## 2016-11-04 DIAGNOSIS — M65322 Trigger finger, left index finger: Secondary | ICD-10-CM | POA: Diagnosis not present

## 2016-11-04 DIAGNOSIS — M79644 Pain in right finger(s): Secondary | ICD-10-CM | POA: Diagnosis not present

## 2016-11-05 DIAGNOSIS — R0789 Other chest pain: Secondary | ICD-10-CM | POA: Diagnosis not present

## 2016-11-05 DIAGNOSIS — R0602 Shortness of breath: Secondary | ICD-10-CM | POA: Diagnosis not present

## 2016-11-05 DIAGNOSIS — R7309 Other abnormal glucose: Secondary | ICD-10-CM | POA: Diagnosis not present

## 2016-11-11 DIAGNOSIS — G4733 Obstructive sleep apnea (adult) (pediatric): Secondary | ICD-10-CM | POA: Diagnosis not present

## 2016-11-18 ENCOUNTER — Other Ambulatory Visit: Payer: Self-pay | Admitting: Physician Assistant

## 2016-11-18 DIAGNOSIS — I2510 Atherosclerotic heart disease of native coronary artery without angina pectoris: Secondary | ICD-10-CM

## 2016-12-03 DIAGNOSIS — M65331 Trigger finger, right middle finger: Secondary | ICD-10-CM | POA: Diagnosis not present

## 2016-12-21 NOTE — Progress Notes (Signed)
Cardiology Office Note    Date:  12/22/2016   ID:  [name], DOB [birth year], MRN [id number]  PCP:  [name], MD  Cardiologist: Sinclair Grooms, MD   Chief Complaint  Patient presents with  . Shortness of Breath    History of Present Illness:  Bradley [name] is a 71 y.o. male with a history of CAD s/p CABG x3V2016, HTN, HLD, OSA, PAD with L CEA x2, OSA on CPAP, and DMT2 who was seen most recently for evaluation of chest pain and SOB.  Chest discomfort and dyspnea was experienced in March. The patient underwent evaluation with nuclear pulmonary perfusion study as well as a stress myocardial perfusion study. Both tests were low risk. He has subsequently done relatively well. He saw primary care, Dr. Melinda Crutch and was treated for reflux with improvement. He denies orthopnea, PND, lower extremity swelling, palpitations, and syncope. No significant complaints at this time.  Past Medical History:  Diagnosis Date  . Anemia   . Arthritis   . Carotid artery occlusion    a. Carotid US 10/16: RICA 60-79%, L CEA patent with 1-39% stenosis  . Coronary artery disease    a. Myoview 9/16:  EF 52%, inferior fixed defect consistent with diaphragmatic attenuation, intermediate risk secondary to poor exercise tolerance and symptoms during stress;  b. LHC 05/17/2015 90% mid LAD, 80% ost D2, 75% mid RCA, 35% prox RCA. >> S/p CABG  . Diabetes mellitus age 32  . Diverticulitis   . GERD (gastroesophageal reflux disease)   . H/O hiatal hernia   . History of echocardiogram    a. Echo 9/16: GLS -15.2%, EF 35-00%, grade 1 diastolic dysfunction, normal wall motion, aortic sclerosis, dilated aortic root 39 mm, atrial septal lipomatous hypertrophy  . Hyperlipidemia   . Hypertension   . Irritable bowel syndrome (IBS)   . Myocardial infarction [facility])    silent inferior MI; patient denies MI history (03/17/13)   . Neuropathy 2013  . Osteoporosis 2013  . PAF (paroxysmal atrial fibrillation)  ([place])    post CABG; Amiodarone stopped 2/2 wheezing;   . Peripheral vascular disease ([place])   . Reflux esophagitis   . Sleep apnea    uses CPAP  . Stroke [facility]) 1987   Right brain stroke  . Wears glasses     Past Surgical History:  Procedure Laterality Date  . CARDIAC CATHETERIZATION N/A 05/17/2015   Procedure: Left Heart Cath and Coronary Angiography;  Surgeon: Belva Crome, MD;  Location: [place] [facility];  Service: Cardiovascular;  Laterality: N/A;  . CAROTID ENDARTERECTOMY  1994 & redo 2001   Left  . COLONOSCOPY W/ BIOPSIES AND POLYPECTOMY    . CORONARY ARTERY BYPASS GRAFT N/A 05/23/2015   Procedure: CORONARY ARTERY BYPASS GRAFTING (CABG) x 3 (LIMA to LAD, SVG to DIAGONAL 2, SVG to PDA) with Endoscopic Vein Harvesting from right greater saphenous vein;  Surgeon: Grace Isaac, MD;  Location: [place];  Service: Open Heart Surgery;  Laterality: N/A;  . FRACTURE SURGERY  2013   Right   foo  t X's 2  . ILIAC ARTERY STENT    . POSTERIOR LUMBAR FUSION  Aug. 14, 2014   Level 1  . PR VEIN BYPASS GRAFT,AORTO-FEM-POP  1998  . [place] SURGERY  2014  . TEE WITHOUT CARDIOVERSION N/A 05/23/2015   Procedure: TRANSESOPHAGEAL ECHOCARDIOGRAM (TEE);  Surgeon: Grace Isaac, MD;  Location: [place];  Service: Open Heart Surgery;  Laterality: N/A;  .  TONSILLECTOMY      Current Medications: Outpatient Medications Prior to Visit  Medication Sig Dispense Refill  . Ascorbic Acid (VITAMIN C) 100 MG tablet Take 100 mg by mouth daily.    Marland Kitchen aspirin EC 81 MG EC tablet Take 1 tablet (81 mg total) by mouth daily.    Marland Kitchen atorvastatin (LIPITOR) 40 MG tablet Take 40 mg by mouth daily.      . Calcium Carbonate-Vitamin D (CALCIUM-D) 600-400 MG-UNIT TABS Take 1 tablet by mouth daily.     . Cholecalciferol (D-3-5) 5000 UNITS capsule Take 5,000 Units by mouth daily.    . clopidogrel (PLAVIX) 75 MG tablet Take 1 tablet (75 mg total) by mouth daily. 1 tablet 0  . cyclobenzaprine (FLEXERIL) 10 MG tablet Take 10 mg  by mouth every 6 (six) hours as needed. for pain  2  . dexlansoprazole (DEXILANT) 60 MG capsule Take 60 mg by mouth daily.    Marland Kitchen glimepiride (AMARYL) 2 MG tablet Take 2 mg by mouth daily before breakfast.      . Hydrocortisone Acetate (PROCTOSOL RE) Place rectally.    . IRON, FERROUS GLUCONATE, PO Take 40 mg by mouth 2 (two) times daily.    Marland Kitchen losartan-hydrochlorothiazide (HYZAAR) 50-12.5 MG tablet TAKE 1 TABLET BY MOUTH EVERY DAY 90 tablet 1  . metFORMIN (GLUCOPHAGE-XR) 500 MG 24 hr tablet Take 2 tablets (1,000 mg total) by mouth 2 (two) times daily.    . Multiple Vitamin (MULTIVITAMIN) capsule Take 1 capsule by mouth daily.      Marland Kitchen HYDROmorphone (DILAUDID) 4 MG tablet Take 2-4 mg by mouth every 4 (four) hours as needed.  0  . metoprolol (LOPRESSOR) 50 MG tablet Take 50 mg by mouth 2 (two) times daily.  11  . metoprolol (LOPRESSOR) 50 MG tablet TAKE 1 TABLET (50 MG TOTAL) BY MOUTH 3 (THREE) TIMES DAILY. (Patient not taking: Reported on 12/22/2016) 90 tablet 4   No facility-administered medications prior to visit.      Allergies:   Hydrocodone; Hydromorphone; Oxycodone; Penicillins; and Sulfa drugs cross reactors   Social History   Social History  . Marital status: Married    Spouse name: N/A  . Number of children: N/A  . Years of education: N/A   Social History Main Topics  . Smoking status: Former Smoker    Packs/day: 2.00    Years: 40.00    Types: Cigarettes    Quit date: 03/26/2009  . Smokeless tobacco: Never Used  . Alcohol use No  . Drug use: No  . Sexual activity: Not Asked   Other Topics Concern  . None   Social History Narrative  . None     Family History:  The patient's family history includes Cancer (age of onset: 33) in his mother; Deep vein thrombosis in his father; Hyperlipidemia in his father and mother; Hypertension in his father; Stroke (age of onset: 76) in his father.   ROS:   Please see the history of present illness.    Reflux/heartburn. Otherwise no  symptoms.  All other systems reviewed and are negative.   PHYSICAL EXAM:   VS:  BP 122/74 (BP Location: Left Arm)   Pulse 83   Ht 6' (1.829 m)   Wt 213 lb (96.6 kg)   BMI 28.89 kg/m    GEN: Well nourished, well developed, in no acute distress  HEENT: normal  Neck: no JVD, carotid bruits, or masses Cardiac: RRR; no murmurs, rubs, or gallops,no edema  Respiratory:  clear to  auscultation bilaterally, normal work of breathing GI: soft, nontender, nondistended, + BS MS: no deformity or atrophy  Skin: warm and dry, no rash Neuro:  Alert and Oriented x 3, Strength and sensation are intact Psych: euthymic mood, full affect  Wt Readings from Last 3 Encounters:  12/22/16 213 lb (96.6 kg)  10/27/16 207 lb (93.9 kg)  10/21/16 207 lb (93.9 kg)      Studies/Labs Reviewed:   EKG:  EKG  Not performed  Recent Labs: 10/21/2016: BUN 29; Creatinine, Ser 1.92; NT-Pro BNP 124; Platelets 272; Potassium 4.6; Sodium 142   Lipid Panel    Component Value Date/Time   CHOL 82 06/01/2015 0315   TRIG 158 (H) 06/01/2015 0315   HDL 24 (L) 06/01/2015 0315   CHOLHDL 3.4 06/01/2015 0315   VLDL 32 06/01/2015 0315   LDLCALC 26 06/01/2015 0315    Additional studies/ records that were reviewed today include:   Nuclear stress test 10/2016: Study Highlights    Nuclear stress EF: 56%.  Blood pressure demonstrated a hypotensive response to exercise.  There was no ST segment deviation noted during stress.  No T wave inversion was noted during stress.  Defect 1: There is a small defect of moderate severity present in the basal-mid inferior location. This is consistent with diaphragmatic attenuation artifact.  This is a low risk study.      NUCLEAR PULMONARY PERFUSION study 10/2016: IMPRESSION: Negative for pulmonary embolism.    ASSESSMENT:    1. Coronary artery disease involving coronary bypass graft of native heart with angina pectoris ([place])   2. PAF (paroxysmal atrial fibrillation)  ([place])   3. Essential hypertension   4. Peripheral vascular disease ([place])   5. Type 2 diabetes mellitus with vascular disease ([place])   6. OSA on CPAP      PLAN:  In order of problems listed above:  1. Stable without evidence of significant ischemia. Resolution of chest discomfort with therapy for reflux. Dyspnea has resolved. 2. No recurrences of paroxysmal atrial fibrillation have been documented. 3. Blood pressure is well controlled. 4. Asymptomatic. 5. Not discussed. 6. Positive airway pressure compliance is recommended.   Clinical follow-up in one year. Call if dyspnea/chest discomfort recur.      Medication Adjustments/Labs and Tests Ordered: Current medicines are reviewed at length with the patient today.  Concerns regarding medicines are outlined above.  Medication changes, Labs and Tests ordered today are listed in the Patient Instructions below. Patient Instructions  Medication Instructions:  Your physician recommends that you continue on your current medications as directed. Please refer to the Current Medication list given to you today.   Labwork: None ordered  Testing/Procedures: None ordered  Follow-Up: Your physician wants you to follow-up in: 6-9 months with Dr.Nikkia Devoss You will receive a reminder letter in the mail two months in advance. If you don't receive a letter, please call our office to schedule the follow-up appointment.   Any Other Special Instructions Will Be Listed Below (If Applicable).     If you need a refill on your cardiac medications before your next appointment, please call your pharmacy.      Signed, Sinclair Grooms, MD  12/22/2016 3:11 PM    [facility] [address], [place], [place]  [address] Phone: [phone]; Fax: [phone]

## 2016-12-22 ENCOUNTER — Encounter: Payer: Self-pay | Admitting: Interventional Cardiology

## 2016-12-22 ENCOUNTER — Ambulatory Visit (INDEPENDENT_AMBULATORY_CARE_PROVIDER_SITE_OTHER): Payer: PPO | Admitting: Interventional Cardiology

## 2016-12-22 VITALS — BP 122/74 | HR 83 | Ht 72.0 in | Wt 213.0 lb

## 2016-12-22 DIAGNOSIS — Z9989 Dependence on other enabling machines and devices: Secondary | ICD-10-CM

## 2016-12-22 DIAGNOSIS — I739 Peripheral vascular disease, unspecified: Secondary | ICD-10-CM

## 2016-12-22 DIAGNOSIS — G4733 Obstructive sleep apnea (adult) (pediatric): Secondary | ICD-10-CM | POA: Diagnosis not present

## 2016-12-22 DIAGNOSIS — I10 Essential (primary) hypertension: Secondary | ICD-10-CM

## 2016-12-22 DIAGNOSIS — I480 Paroxysmal atrial fibrillation: Secondary | ICD-10-CM

## 2016-12-22 DIAGNOSIS — I25709 Atherosclerosis of coronary artery bypass graft(s), unspecified, with unspecified angina pectoris: Secondary | ICD-10-CM | POA: Diagnosis not present

## 2016-12-22 DIAGNOSIS — E1159 Type 2 diabetes mellitus with other circulatory complications: Secondary | ICD-10-CM

## 2016-12-22 NOTE — Patient Instructions (Signed)
Medication Instructions:  Your physician recommends that you continue on your current medications as directed. Please refer to the Current Medication list given to you today.   Labwork: None ordered  Testing/Procedures: None ordered  Follow-Up: Your physician wants you to follow-up in: 6-9 months with Dr.Smith You will receive a reminder letter in the mail two months in advance. If you don't receive a letter, please call our office to schedule the follow-up appointment.   Any Other Special Instructions Will Be Listed Below (If Applicable).     If you need a refill on your cardiac medications before your next appointment, please call your pharmacy.   

## 2016-12-30 DIAGNOSIS — H16102 Unspecified superficial keratitis, left eye: Secondary | ICD-10-CM | POA: Diagnosis not present

## 2017-01-16 DIAGNOSIS — Z08 Encounter for follow-up examination after completed treatment for malignant neoplasm: Secondary | ICD-10-CM | POA: Diagnosis not present

## 2017-01-16 DIAGNOSIS — Z85828 Personal history of other malignant neoplasm of skin: Secondary | ICD-10-CM | POA: Diagnosis not present

## 2017-01-16 DIAGNOSIS — X32XXXA Exposure to sunlight, initial encounter: Secondary | ICD-10-CM | POA: Diagnosis not present

## 2017-01-16 DIAGNOSIS — D485 Neoplasm of uncertain behavior of skin: Secondary | ICD-10-CM | POA: Diagnosis not present

## 2017-01-16 DIAGNOSIS — C4442 Squamous cell carcinoma of skin of scalp and neck: Secondary | ICD-10-CM | POA: Diagnosis not present

## 2017-01-16 DIAGNOSIS — L570 Actinic keratosis: Secondary | ICD-10-CM | POA: Diagnosis not present

## 2017-01-24 DIAGNOSIS — A084 Viral intestinal infection, unspecified: Secondary | ICD-10-CM | POA: Diagnosis not present

## 2017-02-05 DIAGNOSIS — G4733 Obstructive sleep apnea (adult) (pediatric): Secondary | ICD-10-CM | POA: Diagnosis not present

## 2017-02-12 DIAGNOSIS — C4442 Squamous cell carcinoma of skin of scalp and neck: Secondary | ICD-10-CM | POA: Diagnosis not present

## 2017-02-12 DIAGNOSIS — L905 Scar conditions and fibrosis of skin: Secondary | ICD-10-CM | POA: Diagnosis not present

## 2017-02-25 DIAGNOSIS — M65331 Trigger finger, right middle finger: Secondary | ICD-10-CM | POA: Diagnosis not present

## 2017-02-25 DIAGNOSIS — M65322 Trigger finger, left index finger: Secondary | ICD-10-CM | POA: Diagnosis not present

## 2017-03-03 ENCOUNTER — Other Ambulatory Visit: Payer: Self-pay | Admitting: Interventional Cardiology

## 2017-03-09 DIAGNOSIS — G4733 Obstructive sleep apnea (adult) (pediatric): Secondary | ICD-10-CM | POA: Diagnosis not present

## 2017-04-07 DIAGNOSIS — E7800 Pure hypercholesterolemia, unspecified: Secondary | ICD-10-CM | POA: Diagnosis not present

## 2017-04-07 DIAGNOSIS — Z7984 Long term (current) use of oral hypoglycemic drugs: Secondary | ICD-10-CM | POA: Diagnosis not present

## 2017-04-07 DIAGNOSIS — E291 Testicular hypofunction: Secondary | ICD-10-CM | POA: Diagnosis not present

## 2017-04-07 DIAGNOSIS — I10 Essential (primary) hypertension: Secondary | ICD-10-CM | POA: Diagnosis not present

## 2017-04-07 DIAGNOSIS — E1142 Type 2 diabetes mellitus with diabetic polyneuropathy: Secondary | ICD-10-CM | POA: Diagnosis not present

## 2017-04-07 DIAGNOSIS — I679 Cerebrovascular disease, unspecified: Secondary | ICD-10-CM | POA: Diagnosis not present

## 2017-04-07 DIAGNOSIS — Z125 Encounter for screening for malignant neoplasm of prostate: Secondary | ICD-10-CM | POA: Diagnosis not present

## 2017-04-07 DIAGNOSIS — Z79899 Other long term (current) drug therapy: Secondary | ICD-10-CM | POA: Diagnosis not present

## 2017-04-07 DIAGNOSIS — Z0000 Encounter for general adult medical examination without abnormal findings: Secondary | ICD-10-CM | POA: Diagnosis not present

## 2017-04-07 DIAGNOSIS — Z23 Encounter for immunization: Secondary | ICD-10-CM | POA: Diagnosis not present

## 2017-04-20 DIAGNOSIS — E291 Testicular hypofunction: Secondary | ICD-10-CM | POA: Diagnosis not present

## 2017-04-20 DIAGNOSIS — R109 Unspecified abdominal pain: Secondary | ICD-10-CM | POA: Diagnosis not present

## 2017-04-20 DIAGNOSIS — E1142 Type 2 diabetes mellitus with diabetic polyneuropathy: Secondary | ICD-10-CM | POA: Diagnosis not present

## 2017-04-20 DIAGNOSIS — Z7984 Long term (current) use of oral hypoglycemic drugs: Secondary | ICD-10-CM | POA: Diagnosis not present

## 2017-04-20 DIAGNOSIS — I679 Cerebrovascular disease, unspecified: Secondary | ICD-10-CM | POA: Diagnosis not present

## 2017-04-24 ENCOUNTER — Other Ambulatory Visit: Payer: Self-pay | Admitting: Nephrology

## 2017-04-24 DIAGNOSIS — I2510 Atherosclerotic heart disease of native coronary artery without angina pectoris: Secondary | ICD-10-CM | POA: Diagnosis not present

## 2017-04-24 DIAGNOSIS — I129 Hypertensive chronic kidney disease with stage 1 through stage 4 chronic kidney disease, or unspecified chronic kidney disease: Secondary | ICD-10-CM | POA: Diagnosis not present

## 2017-04-24 DIAGNOSIS — I639 Cerebral infarction, unspecified: Secondary | ICD-10-CM | POA: Diagnosis not present

## 2017-04-24 DIAGNOSIS — G4733 Obstructive sleep apnea (adult) (pediatric): Secondary | ICD-10-CM | POA: Diagnosis not present

## 2017-04-24 DIAGNOSIS — E785 Hyperlipidemia, unspecified: Secondary | ICD-10-CM | POA: Diagnosis not present

## 2017-04-24 DIAGNOSIS — N183 Chronic kidney disease, stage 3 unspecified: Secondary | ICD-10-CM

## 2017-04-24 DIAGNOSIS — R809 Proteinuria, unspecified: Secondary | ICD-10-CM | POA: Diagnosis not present

## 2017-04-24 DIAGNOSIS — E1129 Type 2 diabetes mellitus with other diabetic kidney complication: Secondary | ICD-10-CM | POA: Diagnosis not present

## 2017-05-01 ENCOUNTER — Ambulatory Visit
Admission: RE | Admit: 2017-05-01 | Discharge: 2017-05-01 | Disposition: A | Discharge status: Home / Self Care | Payer: PPO | Source: Ambulatory Visit | Attending: Nephrology | Admitting: Nephrology

## 2017-05-01 DIAGNOSIS — N183 Chronic kidney disease, stage 3 unspecified: Secondary | ICD-10-CM

## 2017-05-14 ENCOUNTER — Other Ambulatory Visit: Payer: Self-pay | Admitting: Physician Assistant

## 2017-05-14 DIAGNOSIS — I2510 Atherosclerotic heart disease of native coronary artery without angina pectoris: Secondary | ICD-10-CM

## 2017-05-15 NOTE — Telephone Encounter (Signed)
Different directions on last office note and med list. Please verify sig

## 2017-05-15 NOTE — Telephone Encounter (Signed)
Spoke with pt to clarify.  Pt taking Metoprolol Tartrate 50mg  BID.  Ok to send in prescription as pt is taking.  Pt prefers 90 day supply.  Thanks!

## 2017-05-18 DIAGNOSIS — J329 Chronic sinusitis, unspecified: Secondary | ICD-10-CM | POA: Diagnosis not present

## 2017-05-18 DIAGNOSIS — Z794 Long term (current) use of insulin: Secondary | ICD-10-CM | POA: Diagnosis not present

## 2017-05-18 DIAGNOSIS — E1142 Type 2 diabetes mellitus with diabetic polyneuropathy: Secondary | ICD-10-CM | POA: Diagnosis not present

## 2017-06-01 DIAGNOSIS — E1142 Type 2 diabetes mellitus with diabetic polyneuropathy: Secondary | ICD-10-CM | POA: Diagnosis not present

## 2017-06-01 DIAGNOSIS — Z7984 Long term (current) use of oral hypoglycemic drugs: Secondary | ICD-10-CM | POA: Diagnosis not present

## 2017-06-05 IMAGING — CR DG CHEST 2V
2 series · 2 of 2 positions shown · non-contrast
Comparison: PA and lateral chest x-ray May 31, 2015

CLINICAL DATA: Status post CABG on May 23, 2015, no current
chest complaints

EXAM:
CHEST  2 VIEW

[w chest pa]
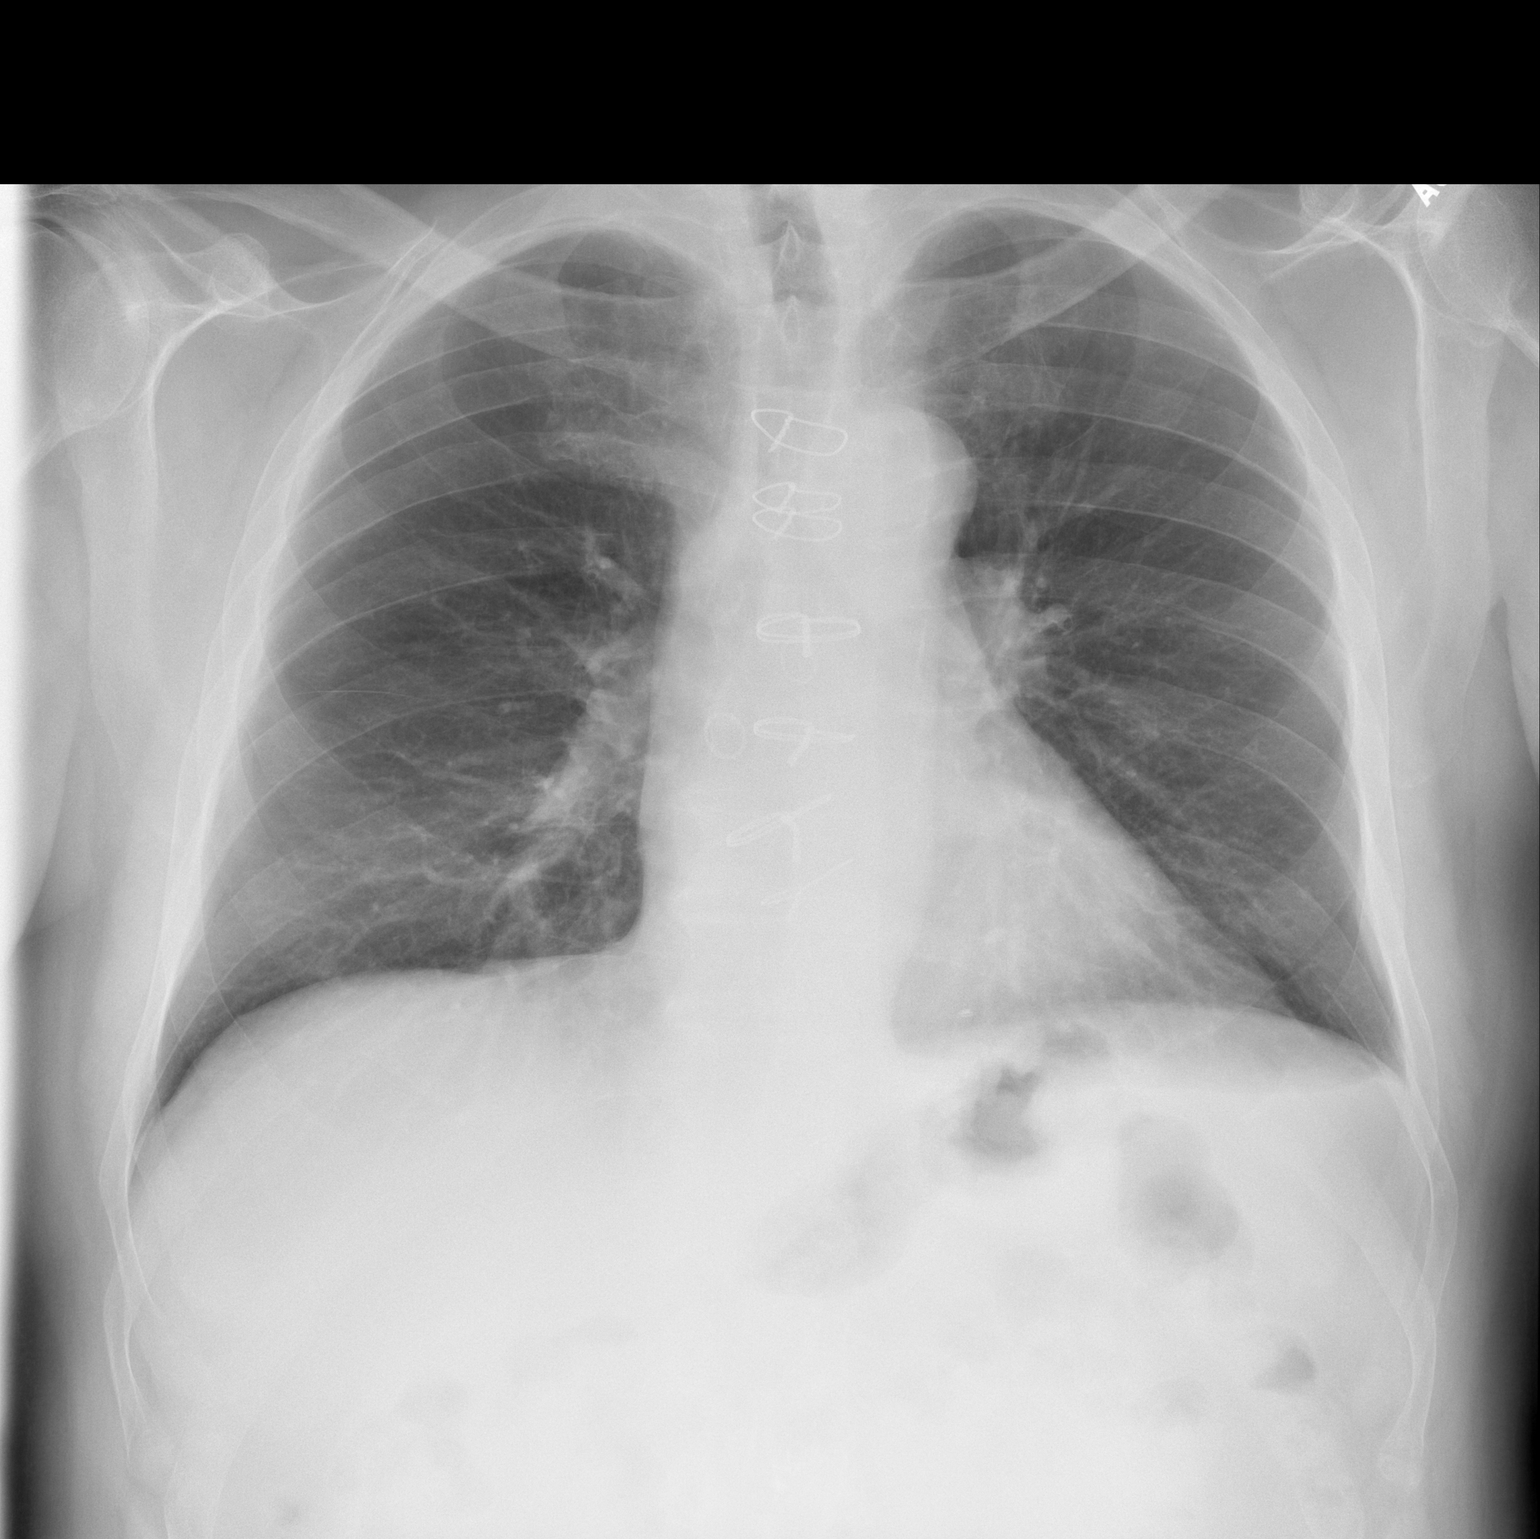

[w chest lat]
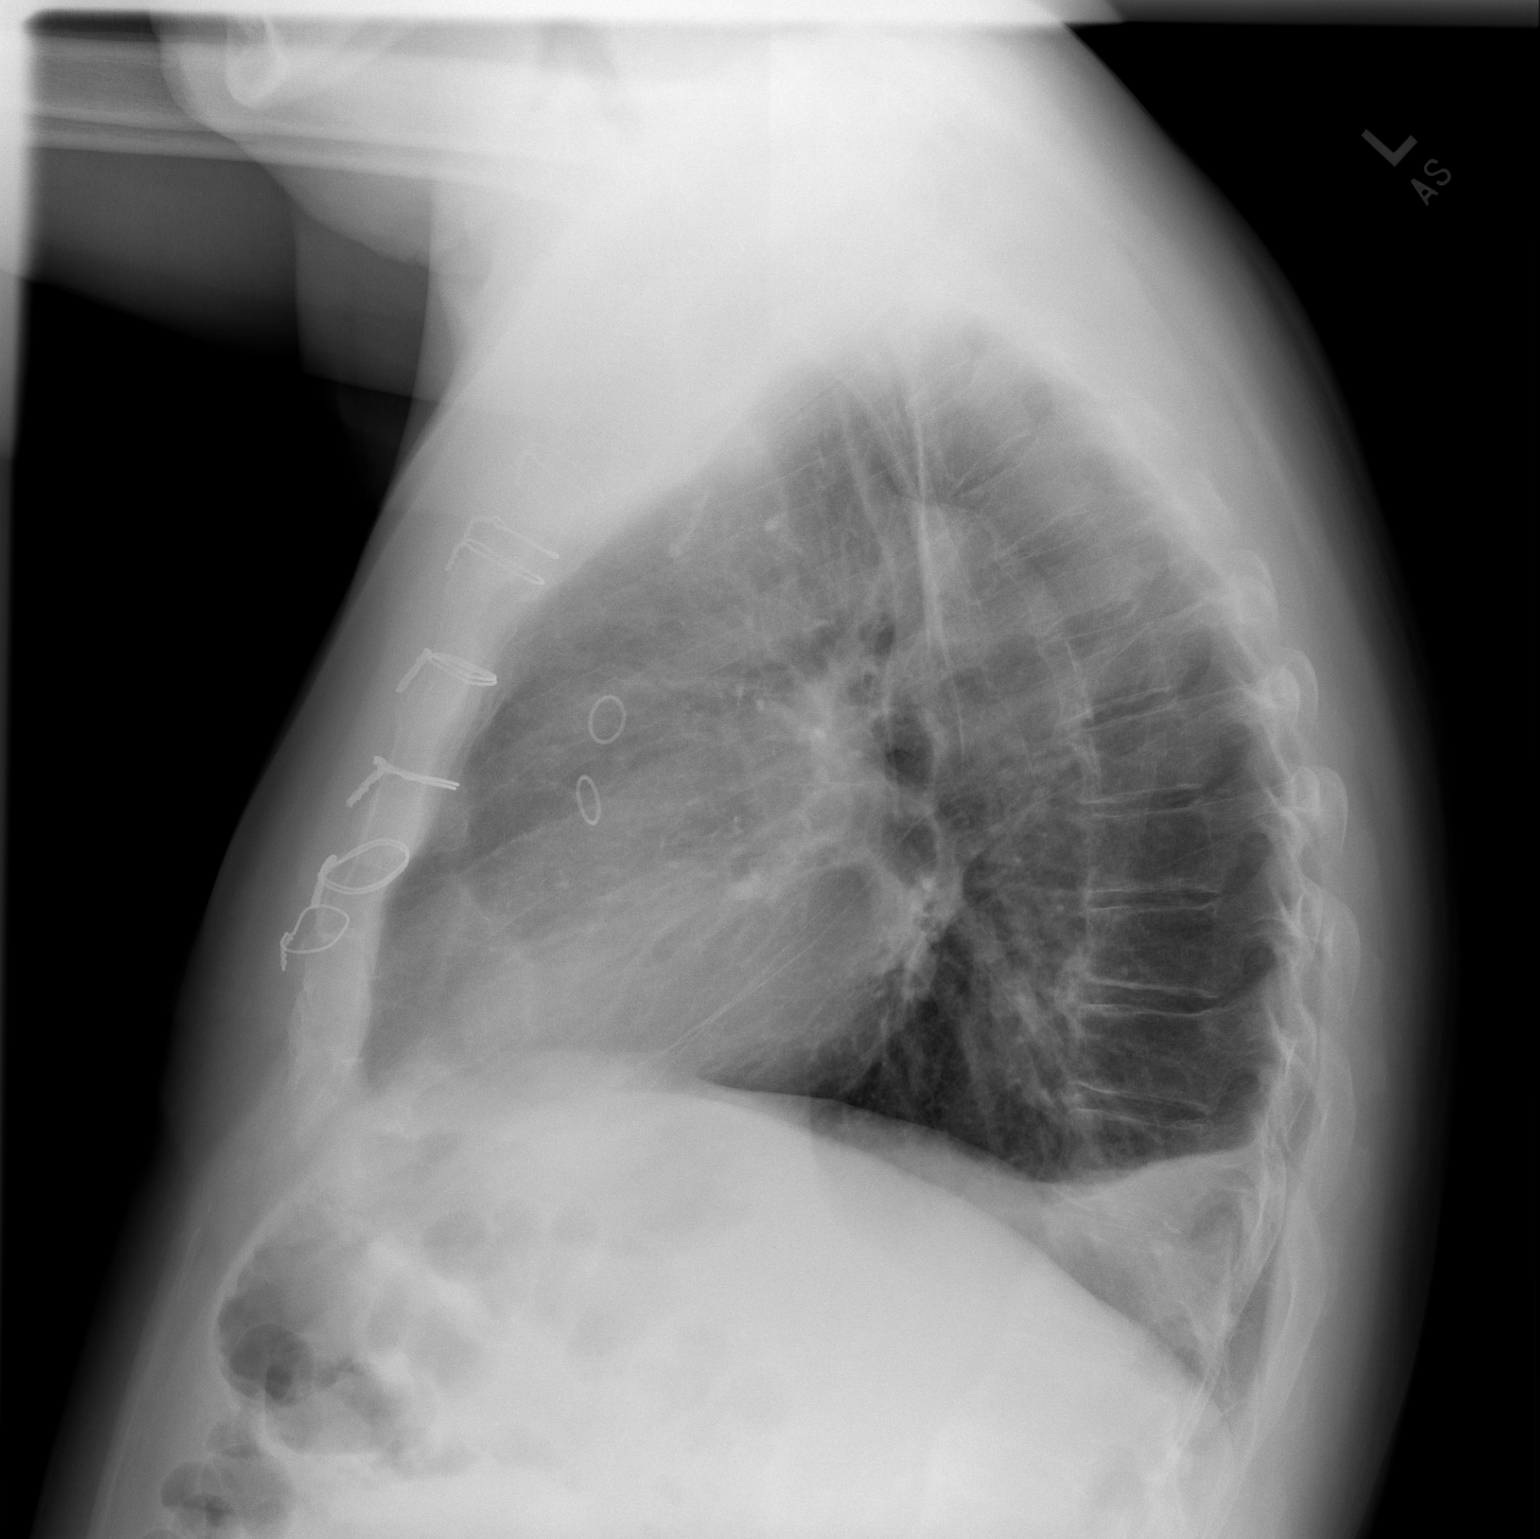

[2 of 2 positions shown; findings below may reference images not displayed]

FINDINGS: There has been further interval decrease in the volume of pleural
fluid layering posteriorly and laterally on the left. Both lungs are
clear. There is no pneumothorax. The heart and pulmonary vascularity
are normal. There is 7 intact sternal wires. The retrosternal soft
tissues are normal. The thoracic spine exhibits only minimal
degenerative change.
IMPRESSION: Ongoing resolution of the small left basilar pleural effusion. There
is no evidence of CHF, pneumonia, nor other acute cardiopulmonary
abnormality.

## 2017-06-12 DIAGNOSIS — J329 Chronic sinusitis, unspecified: Secondary | ICD-10-CM | POA: Diagnosis not present

## 2017-06-12 DIAGNOSIS — Z7984 Long term (current) use of oral hypoglycemic drugs: Secondary | ICD-10-CM | POA: Diagnosis not present

## 2017-06-12 DIAGNOSIS — Z79899 Other long term (current) drug therapy: Secondary | ICD-10-CM | POA: Diagnosis not present

## 2017-06-12 DIAGNOSIS — E1142 Type 2 diabetes mellitus with diabetic polyneuropathy: Secondary | ICD-10-CM | POA: Diagnosis not present

## 2017-06-12 DIAGNOSIS — G479 Sleep disorder, unspecified: Secondary | ICD-10-CM | POA: Diagnosis not present

## 2017-07-08 ENCOUNTER — Other Ambulatory Visit: Payer: Self-pay | Admitting: Orthopedic Surgery

## 2017-07-08 DIAGNOSIS — M65322 Trigger finger, left index finger: Secondary | ICD-10-CM | POA: Diagnosis not present

## 2017-07-08 DIAGNOSIS — M65331 Trigger finger, right middle finger: Secondary | ICD-10-CM | POA: Diagnosis not present

## 2017-07-10 ENCOUNTER — Encounter (HOSPITAL_BASED_OUTPATIENT_CLINIC_OR_DEPARTMENT_OTHER): Payer: Self-pay | Admitting: *Deleted

## 2017-07-10 ENCOUNTER — Encounter (HOSPITAL_BASED_OUTPATIENT_CLINIC_OR_DEPARTMENT_OTHER)
Admission: RE | Admit: 2017-07-10 | Discharge: 2017-07-10 | Disposition: A | Discharge status: Home / Self Care | Payer: PPO | Source: Ambulatory Visit | Attending: Orthopedic Surgery | Admitting: Orthopedic Surgery

## 2017-07-10 ENCOUNTER — Other Ambulatory Visit: Payer: Self-pay

## 2017-07-10 DIAGNOSIS — I480 Paroxysmal atrial fibrillation: Secondary | ICD-10-CM | POA: Diagnosis not present

## 2017-07-10 DIAGNOSIS — M1990 Unspecified osteoarthritis, unspecified site: Secondary | ICD-10-CM | POA: Diagnosis not present

## 2017-07-10 DIAGNOSIS — Z951 Presence of aortocoronary bypass graft: Secondary | ICD-10-CM | POA: Diagnosis not present

## 2017-07-10 DIAGNOSIS — K589 Irritable bowel syndrome without diarrhea: Secondary | ICD-10-CM | POA: Diagnosis not present

## 2017-07-10 DIAGNOSIS — G4730 Sleep apnea, unspecified: Secondary | ICD-10-CM | POA: Diagnosis not present

## 2017-07-10 DIAGNOSIS — K219 Gastro-esophageal reflux disease without esophagitis: Secondary | ICD-10-CM | POA: Diagnosis not present

## 2017-07-10 DIAGNOSIS — M65331 Trigger finger, right middle finger: Secondary | ICD-10-CM | POA: Diagnosis not present

## 2017-07-10 DIAGNOSIS — E785 Hyperlipidemia, unspecified: Secondary | ICD-10-CM | POA: Diagnosis not present

## 2017-07-10 DIAGNOSIS — Z7902 Long term (current) use of antithrombotics/antiplatelets: Secondary | ICD-10-CM | POA: Diagnosis not present

## 2017-07-10 DIAGNOSIS — Z87891 Personal history of nicotine dependence: Secondary | ICD-10-CM | POA: Diagnosis not present

## 2017-07-10 DIAGNOSIS — E1140 Type 2 diabetes mellitus with diabetic neuropathy, unspecified: Secondary | ICD-10-CM | POA: Diagnosis not present

## 2017-07-10 DIAGNOSIS — M810 Age-related osteoporosis without current pathological fracture: Secondary | ICD-10-CM | POA: Diagnosis not present

## 2017-07-10 DIAGNOSIS — I2510 Atherosclerotic heart disease of native coronary artery without angina pectoris: Secondary | ICD-10-CM | POA: Diagnosis not present

## 2017-07-10 DIAGNOSIS — Z882 Allergy status to sulfonamides status: Secondary | ICD-10-CM | POA: Diagnosis not present

## 2017-07-10 DIAGNOSIS — Z885 Allergy status to narcotic agent status: Secondary | ICD-10-CM | POA: Diagnosis not present

## 2017-07-10 DIAGNOSIS — I252 Old myocardial infarction: Secondary | ICD-10-CM | POA: Diagnosis not present

## 2017-07-10 DIAGNOSIS — Z8673 Personal history of transient ischemic attack (TIA), and cerebral infarction without residual deficits: Secondary | ICD-10-CM | POA: Diagnosis not present

## 2017-07-10 DIAGNOSIS — I10 Essential (primary) hypertension: Secondary | ICD-10-CM | POA: Diagnosis not present

## 2017-07-10 DIAGNOSIS — Z880 Allergy status to penicillin: Secondary | ICD-10-CM | POA: Diagnosis not present

## 2017-07-10 LAB — BASIC METABOLIC PANEL
Anion gap: 6 (ref 5–15)
BUN: 28 mg/dL — ABNORMAL HIGH (ref 6–20)
CO2: 28 mmol/L (ref 22–32)
Calcium: 9.1 mg/dL (ref 8.9–10.3)
Chloride: 105 mmol/L (ref 101–111)
Creatinine, Ser: 2.27 mg/dL — ABNORMAL HIGH (ref 0.61–1.24)
GFR calc Af Amer: 31 mL/min — ABNORMAL LOW (ref >60)
GFR calc non Af Amer: 27 mL/min — ABNORMAL LOW (ref >60)
Glucose, Bld: 117 mg/dL — ABNORMAL HIGH (ref 65–99)
Potassium: 4.6 mmol/L (ref 3.5–5.1)
Sodium: 139 mmol/L (ref 135–145)

## 2017-07-13 NOTE — Progress Notes (Signed)
Labs reviewed by Dr. Royce Macadamia, will proceed with surgery as scheduled.

## 2017-07-14 ENCOUNTER — Ambulatory Visit (HOSPITAL_BASED_OUTPATIENT_CLINIC_OR_DEPARTMENT_OTHER): Payer: PPO | Admitting: Anesthesiology

## 2017-07-14 ENCOUNTER — Encounter (HOSPITAL_BASED_OUTPATIENT_CLINIC_OR_DEPARTMENT_OTHER)
Admission: RE | Disposition: A | Discharge status: Home / Self Care | Payer: Self-pay | Source: Ambulatory Visit | Attending: Orthopedic Surgery

## 2017-07-14 ENCOUNTER — Ambulatory Visit (HOSPITAL_BASED_OUTPATIENT_CLINIC_OR_DEPARTMENT_OTHER)
Admission: RE | Admit: 2017-07-14 | Discharge: 2017-07-14 | Disposition: A | Discharge status: Home / Self Care | Payer: PPO | Source: Ambulatory Visit | Attending: Orthopedic Surgery | Admitting: Orthopedic Surgery

## 2017-07-14 DIAGNOSIS — Z7902 Long term (current) use of antithrombotics/antiplatelets: Secondary | ICD-10-CM | POA: Diagnosis not present

## 2017-07-14 DIAGNOSIS — I2510 Atherosclerotic heart disease of native coronary artery without angina pectoris: Secondary | ICD-10-CM | POA: Insufficient documentation

## 2017-07-14 DIAGNOSIS — K219 Gastro-esophageal reflux disease without esophagitis: Secondary | ICD-10-CM | POA: Diagnosis not present

## 2017-07-14 DIAGNOSIS — I480 Paroxysmal atrial fibrillation: Secondary | ICD-10-CM | POA: Insufficient documentation

## 2017-07-14 DIAGNOSIS — Z882 Allergy status to sulfonamides status: Secondary | ICD-10-CM | POA: Insufficient documentation

## 2017-07-14 DIAGNOSIS — I10 Essential (primary) hypertension: Secondary | ICD-10-CM | POA: Diagnosis not present

## 2017-07-14 DIAGNOSIS — K589 Irritable bowel syndrome without diarrhea: Secondary | ICD-10-CM | POA: Insufficient documentation

## 2017-07-14 DIAGNOSIS — M1990 Unspecified osteoarthritis, unspecified site: Secondary | ICD-10-CM | POA: Insufficient documentation

## 2017-07-14 DIAGNOSIS — Z880 Allergy status to penicillin: Secondary | ICD-10-CM | POA: Insufficient documentation

## 2017-07-14 DIAGNOSIS — E1140 Type 2 diabetes mellitus with diabetic neuropathy, unspecified: Secondary | ICD-10-CM | POA: Diagnosis not present

## 2017-07-14 DIAGNOSIS — Z87891 Personal history of nicotine dependence: Secondary | ICD-10-CM | POA: Insufficient documentation

## 2017-07-14 DIAGNOSIS — E785 Hyperlipidemia, unspecified: Secondary | ICD-10-CM | POA: Insufficient documentation

## 2017-07-14 DIAGNOSIS — Z885 Allergy status to narcotic agent status: Secondary | ICD-10-CM | POA: Insufficient documentation

## 2017-07-14 DIAGNOSIS — Z951 Presence of aortocoronary bypass graft: Secondary | ICD-10-CM | POA: Insufficient documentation

## 2017-07-14 DIAGNOSIS — M810 Age-related osteoporosis without current pathological fracture: Secondary | ICD-10-CM | POA: Insufficient documentation

## 2017-07-14 DIAGNOSIS — I25709 Atherosclerosis of coronary artery bypass graft(s), unspecified, with unspecified angina pectoris: Secondary | ICD-10-CM | POA: Diagnosis not present

## 2017-07-14 DIAGNOSIS — G4730 Sleep apnea, unspecified: Secondary | ICD-10-CM | POA: Insufficient documentation

## 2017-07-14 DIAGNOSIS — Z8673 Personal history of transient ischemic attack (TIA), and cerebral infarction without residual deficits: Secondary | ICD-10-CM | POA: Insufficient documentation

## 2017-07-14 DIAGNOSIS — M65331 Trigger finger, right middle finger: Secondary | ICD-10-CM | POA: Diagnosis not present

## 2017-07-14 DIAGNOSIS — I252 Old myocardial infarction: Secondary | ICD-10-CM | POA: Diagnosis not present

## 2017-07-14 HISTORY — PX: TRIGGER FINGER RELEASE: SHX641

## 2017-07-14 LAB — GLUCOSE, CAPILLARY
Glucose-Capillary: 141 mg/dL — ABNORMAL HIGH (ref 65–99)
Glucose-Capillary: 125 mg/dL — ABNORMAL HIGH (ref 65–99)

## 2017-07-14 SURGERY — RELEASE, A1 PULLEY, FOR TRIGGER FINGER
Anesthesia: Monitor Anesthesia Care | Site: Middle Finger | Laterality: Right

## 2017-07-14 MED ORDER — ONDANSETRON HCL 4 MG/2ML IJ SOLN
INTRAMUSCULAR | Status: DC | PRN
  Administered 2017-07-14: 4 mg via INTRAVENOUS

## 2017-07-14 MED ORDER — PROPOFOL 500 MG/50ML IV EMUL
INTRAVENOUS | Status: DC | PRN
  Administered 2017-07-14: 50 ug/kg/min via INTRAVENOUS

## 2017-07-14 MED ORDER — PROPOFOL 10 MG/ML IV BOLUS
INTRAVENOUS | Status: AC
  Filled 2017-07-14: qty 20

## 2017-07-14 MED ORDER — 0.9 % SODIUM CHLORIDE (POUR BTL) OPTIME
TOPICAL | Status: DC | PRN
  Administered 2017-07-14: 100 mL

## 2017-07-14 MED ORDER — LACTATED RINGERS IV SOLN
INTRAVENOUS | Status: DC
  Administered 2017-07-14: 13:00:00 via INTRAVENOUS

## 2017-07-14 MED ORDER — MIDAZOLAM HCL 2 MG/2ML IJ SOLN: 1.0000 mg | INTRAMUSCULAR | Status: DC | PRN

## 2017-07-14 MED ORDER — ONDANSETRON HCL 4 MG/2ML IJ SOLN
INTRAMUSCULAR | Status: AC
  Filled 2017-07-14: qty 2

## 2017-07-14 MED ORDER — BUPIVACAINE-EPINEPHRINE 0.5% -1:200000 IJ SOLN
INTRAMUSCULAR | Status: DC | PRN
  Administered 2017-07-14: 2.5 mL

## 2017-07-14 MED ORDER — LIDOCAINE HCL 2 % IJ SOLN
INTRAMUSCULAR | Status: DC | PRN
  Administered 2017-07-14: 2.5 mL

## 2017-07-14 MED ORDER — CLINDAMYCIN PHOSPHATE 900 MG/50ML IV SOLN
900.0000 mg | INTRAVENOUS | Status: AC
  Administered 2017-07-14: 900 mg via INTRAVENOUS

## 2017-07-14 MED ORDER — LACTATED RINGERS IV SOLN
INTRAVENOUS | Status: DC
  Administered 2017-07-14: 15:00:00 via INTRAVENOUS

## 2017-07-14 MED ORDER — CLINDAMYCIN PHOSPHATE 900 MG/50ML IV SOLN
INTRAVENOUS | Status: AC
  Filled 2017-07-14: qty 50

## 2017-07-14 MED ORDER — LIDOCAINE 2% (20 MG/ML) 5 ML SYRINGE
INTRAMUSCULAR | Status: DC | PRN
Start: 2017-07-14 — End: 2017-07-14
  Administered 2017-07-14: 80 mg via INTRAVENOUS

## 2017-07-14 MED ORDER — FENTANYL CITRATE (PF) 100 MCG/2ML IJ SOLN
50.0000 ug | INTRAMUSCULAR | Status: DC | PRN
  Administered 2017-07-14: 50 ug via INTRAVENOUS

## 2017-07-14 MED ORDER — FENTANYL CITRATE (PF) 100 MCG/2ML IJ SOLN
INTRAMUSCULAR | Status: AC
  Filled 2017-07-14: qty 2

## 2017-07-14 MED ORDER — LIDOCAINE HCL 2 % IJ SOLN
INTRAMUSCULAR | Status: AC
  Filled 2017-07-14: qty 20

## 2017-07-14 MED ORDER — FENTANYL CITRATE (PF) 100 MCG/2ML IJ SOLN
25.0000 ug | INTRAMUSCULAR | Status: DC | PRN
Start: 2017-07-14 — End: 2017-07-14

## 2017-07-14 MED ORDER — SCOPOLAMINE 1 MG/3DAYS TD PT72: 1.0000 | MEDICATED_PATCH | Freq: Once | TRANSDERMAL | Status: DC | PRN

## 2017-07-14 MED ORDER — ACETAMINOPHEN 325 MG PO TABS: 650.0000 mg | ORAL_TABLET | Freq: Four times a day (QID) | ORAL | Status: DC | PRN

## 2017-07-14 MED ORDER — HYDROMORPHONE HCL 2 MG PO TABS: 2.0000 mg | ORAL_TABLET | Freq: Four times a day (QID) | ORAL | 0 refills | Status: AC | PRN

## 2017-07-14 SURGICAL SUPPLY — 39 items
BLADE SURG 15 STRL LF DISP TIS (BLADE) ×1 IMPLANT
BLADE SURG 15 STRL SS (BLADE) ×2
BNDG COHESIVE 1X5 TAN STRL LF (GAUZE/BANDAGES/DRESSINGS) ×3 IMPLANT
BNDG CONFORM 2 STRL LF (GAUZE/BANDAGES/DRESSINGS) ×3 IMPLANT
BNDG ESMARK 4X9 LF (GAUZE/BANDAGES/DRESSINGS) ×3 IMPLANT
BNDG GAUZE ELAST 4 BULKY (GAUZE/BANDAGES/DRESSINGS) ×3 IMPLANT
CHLORAPREP W/TINT 26ML (MISCELLANEOUS) ×3 IMPLANT
COVER BACK TABLE 60X90IN (DRAPES) ×3 IMPLANT
COVER MAYO STAND STRL (DRAPES) ×3 IMPLANT
CUFF TOURNIQUET SINGLE 18IN (TOURNIQUET CUFF) ×3 IMPLANT
DRAPE EXTREMITY T 121X128X90 (DRAPE) ×3 IMPLANT
DRAPE SURG 17X23 STRL (DRAPES) ×3 IMPLANT
DRSG EMULSION OIL 3X3 NADH (GAUZE/BANDAGES/DRESSINGS) ×3 IMPLANT
GAUZE SPONGE 4X4 12PLY STRL LF (GAUZE/BANDAGES/DRESSINGS) ×3 IMPLANT
GLOVE BIO SURGEON STRL SZ 6.5 (GLOVE) ×4 IMPLANT
GLOVE BIO SURGEON STRL SZ7.5 (GLOVE) ×3 IMPLANT
GLOVE BIO SURGEONS STRL SZ 6.5 (GLOVE) ×2
GLOVE BIOGEL PI IND STRL 7.0 (GLOVE) ×4 IMPLANT
GLOVE BIOGEL PI IND STRL 8 (GLOVE) ×1 IMPLANT
GLOVE BIOGEL PI INDICATOR 7.0 (GLOVE) ×8
GLOVE BIOGEL PI INDICATOR 8 (GLOVE) ×2
GLOVE ECLIPSE 6.5 STRL STRAW (GLOVE) ×3 IMPLANT
GOWN STRL REUS W/ TWL LRG LVL3 (GOWN DISPOSABLE) IMPLANT
GOWN STRL REUS W/ TWL XL LVL3 (GOWN DISPOSABLE) ×2 IMPLANT
GOWN STRL REUS W/TWL LRG LVL3 (GOWN DISPOSABLE)
GOWN STRL REUS W/TWL XL LVL3 (GOWN DISPOSABLE) ×7 IMPLANT
NDL SAFETY ECLIPSE 18X1.5 (NEEDLE) ×1 IMPLANT
NEEDLE HYPO 18GX1.5 SHARP (NEEDLE) ×2
NEEDLE HYPO 25X1 1.5 SAFETY (NEEDLE) ×3 IMPLANT
NS IRRIG 1000ML POUR BTL (IV SOLUTION) ×3 IMPLANT
PACK BASIN DAY SURGERY FS (CUSTOM PROCEDURE TRAY) ×3 IMPLANT
STOCKINETTE 6  STRL (DRAPES) ×2
STOCKINETTE 6 STRL (DRAPES) ×1 IMPLANT
SUT VICRYL RAPIDE 4/0 PS 2 (SUTURE) ×3 IMPLANT
SYR 10ML LL (SYRINGE) ×3 IMPLANT
SYR BULB 3OZ (MISCELLANEOUS) ×3 IMPLANT
TOWEL OR 17X24 6PK STRL BLUE (TOWEL DISPOSABLE) ×3 IMPLANT
TOWEL OR NON WOVEN STRL DISP B (DISPOSABLE) ×3 IMPLANT
UNDERPAD 30X30 (UNDERPADS AND DIAPERS) ×3 IMPLANT

## 2017-07-14 NOTE — Transfer of Care (Signed)
Immediate Anesthesia Transfer of Care Note  Patient: Bradley [name]  Procedure(s) Performed: RIGHT LONG FINGER TRIGGER RELEASE (Right Middle Finger)  Patient Location: PACU  Anesthesia Type:MAC  Level of Consciousness: awake, alert  and oriented  Airway & Oxygen Therapy: Patient Spontanous Breathing  Post-op Assessment: Report given to RN and Post -op Vital signs reviewed and stable  Post vital signs: Reviewed and stable  Last Vitals:  Vitals:   07/14/17 1305  BP: (!) 155/79  Pulse: 62  Resp: 16  Temp: 36.6 C  SpO2: 99%    Last Pain:  Vitals:   07/14/17 1305  TempSrc: Oral  PainSc: 1          Complications: No apparent anesthesia complications

## 2017-07-14 NOTE — Anesthesia Preprocedure Evaluation (Addendum)
Anesthesia Evaluation  Patient identified by MRN, date of birth, ID band Patient awake    Reviewed: Allergy & Precautions, NPO status , Patient's Chart, lab work & pertinent test results, reviewed documented beta blocker date and time   Airway Mallampati: II  TM Distance: >3 FB Neck ROM: Full    Dental   Pulmonary sleep apnea , former smoker,    breath sounds clear to auscultation       Cardiovascular hypertension, Pt. on medications and Pt. on home beta blockers + CAD, + Past MI, + CABG and + Peripheral Vascular Disease  + dysrhythmias  Rhythm:Regular Rate:Normal     Neuro/Psych CVA    GI/Hepatic Neg liver ROS, GERD  ,  Endo/Other  diabetes, Type 2  Renal/GU Renal disease     Musculoskeletal   Abdominal   Peds  Hematology  (+) anemia ,   Anesthesia Other Findings   Reproductive/Obstetrics                            Lab Results  Component Value Date   WBC 8.7 10/21/2016   HGB 16.3 10/21/2016   HCT 45.7 10/21/2016   MCV 94 10/21/2016   PLT 272 10/21/2016   Lab Results  Component Value Date   CREATININE 2.27 (H) 07/10/2017   BUN 28 (H) 07/10/2017   NA 139 07/10/2017   K 4.6 07/10/2017   CL 105 07/10/2017   CO2 28 07/10/2017    Anesthesia Physical Anesthesia Plan  ASA: III  Anesthesia Plan: MAC   Post-op Pain Management:    Induction: Intravenous  PONV Risk Score and Plan: 1 and Ondansetron, Propofol infusion and Treatment may vary due to age or medical condition  Airway Management Planned: Natural Airway and Mask  Additional Equipment:   Intra-op Plan:   Post-operative Plan:   Informed Consent: I have reviewed the patients History and Physical, chart, labs and discussed the procedure including the risks, benefits and alternatives for the proposed anesthesia with the patient or authorized representative who has indicated his/her understanding and acceptance.   Dental  advisory given  Plan Discussed with: CRNA  Anesthesia Plan Comments:        Anesthesia Quick Evaluation

## 2017-07-14 NOTE — Discharge Instructions (Signed)
Discharge Instructions   You have a light dressing on your hand.  You may begin gentle motion of your fingers and hand immediately, but you should not do any heavy lifting or gripping.  Elevate your hand to reduce pain & swelling of the digits.  Ice over the operative site may be helpful to reduce pain & swelling.  DO NOT USE HEAT. Pain medicine has been prescribed for you.   Leave the dressing in place until the third day after your surgery and then remove it, leaving it open to air.  After the bandage has been removed you may shower, regularly washing the incision and letting the water run over it, but not submerging it (no swimming, soaking it in dishwater, etc.) You may drive a car when you are off of prescription pain medications and can safely control your vehicle with both hands. We will address whether therapy will be required or not when you return to the office. You may have already made your follow-up appointment when we completed your preop visit.  If not, please call our office today or the next business day to make your return appointment for 10-15 days after surgery.   Please call [phone] during normal business hours or [phone] after hours for any problems. Including the following:  - excessive redness of the incisions - drainage for more than 4 days - fever of more than 101.5 F  *Please note that pain medications will not be refilled after hours or on weekends.   Post Anesthesia Home Care Instructions  Activity: Get plenty of rest for the remainder of the day. A responsible individual must stay with you for 24 hours following the procedure.  For the next 24 hours, DO NOT: -Drive a car -Paediatric nurse -Drink alcoholic beverages -Take any medication unless instructed by your physician -Make any legal decisions or sign important papers.  Meals: Start with liquid foods such as gelatin or soup. Progress to regular foods as tolerated. Avoid greasy, spicy,  heavy foods. If nausea and/or vomiting occur, drink only clear liquids until the nausea and/or vomiting subsides. Call your physician if vomiting continues.  Special Instructions/Symptoms: Your throat may feel dry or sore from the anesthesia or the breathing tube placed in your throat during surgery. If this causes discomfort, gargle with warm salt water. The discomfort should disappear within 24 hours.  If you had a scopolamine patch placed behind your ear for the management of post- operative nausea and/or vomiting:  1. The medication in the patch is effective for 72 hours, after which it should be removed.  Wrap patch in a tissue and discard in the trash. Wash hands thoroughly with soap and water. 2. You may remove the patch earlier than 72 hours if you experience unpleasant side effects which may include dry mouth, dizziness or visual disturbances. 3. Avoid touching the patch. Wash your hands with soap and water after contact with the patch.

## 2017-07-14 NOTE — Op Note (Signed)
07/14/2017  1:16 PM  PATIENT:  Bradley [name]  71 y.o. male  PRE-OPERATIVE DIAGNOSIS:  RIGHT LONG FINGER TRIGGER DIGIT M65.331  POST-OPERATIVE DIAGNOSIS:  Same  PROCEDURE:  Procedure(s): RIGHT LONG FINGER TRIGGER RELEASE  SURGEON:  Surgeon(s): Milly Jakob, MD  PHYSICIAN ASSISTANT: Morley Kos, OPA-C  ANESTHESIA:  Local / MAC  SPECIMENS:  None  DRAINS:   None  EBL:  less than 50 mL  PREOPERATIVE INDICATIONS:  Bradley [name] is a  71 y.o. male with a diagnosis of RIGHT LONG FINGER TRIGGER DIGIT M65.331 who failed conservative measures and elected for surgical management.    The risks benefits and alternatives were discussed with the patient preoperatively including but not limited to the risks of infection, bleeding, nerve injury, cardiopulmonary complications, the need for revision surgery, among others, and the patient verbalized understanding and consented to proceed.  OPERATIVE IMPLANTS: None  OPERATIVE FINDINGS: See Below  OPERATIVE PROCEDURE:  After receiving prophylactic antibiotics, the patient was escorted to the operative theatre and placed in a supine position.   A surgical "time-out" was performed during which the planned procedure, proposed operative site, and the correct patient identity were compared to the operative consent and agreement confirmed by the circulating nurse according to current facility policy.  Digital block was performed with a mixture of lidocaine and Marcaine, bearing epinephrine. Following application of a tourniquet to the operative extremity, the exposed skin was prepped with Chloraprep and draped in the usual sterile fashion.  The limb was exsanguinated with an Esmarch bandage and the tourniquet inflated to approximately 135mHg higher than systolic BP.  An oblique incision was made over the A1 pulley of the affected digit.  Subcutaneous taste tissues were dissected with blunt and spreading dissection to reveal an underlying flexor  tendon sheath and A1 pulley.  With the neurovascular structures protected, the A1 pulley was split in the midline under direct visualization with loupe assistance.  Some crossing bands proximal to the A1 pulley were also released.  The tendons were pulled into view, cleaned of thickened synovium and return to their bed.  The wound was irrigated, tourniquet released, and skin closed with 4-0 Vicryl Rapide.  A light dressing was applied and the patient was taken to the recovery room.  DISPOSITION: The patient will be discharged home today with typical instructions, returning in 10-15 days.

## 2017-07-14 NOTE — H&P (Signed)
Bradley [name] is an 71 y.o. male.   CC / Reason for Visit: Bilateral hand problems HPI: This patient returns to clinic today indicating that he continues to have a trigger digit symptoms in both his right long finger and left index finger.  He has had 2 injections in the past in February as well as May.  He is here today with the hopes of 2 more injections and then also to schedule trigger finger release.  Patient reminds me is a type II diabetic.  He is not insulin-dependent.  He also takes Plavix and has a history of having a heart attack.  He reports that he is not on anticoagulant.    Past Medical History:  Diagnosis Date  . Anemia   . Arthritis   . Carotid artery occlusion    a. Carotid US 10/16: RICA 60-79%, L CEA patent with 1-39% stenosis  . Coronary artery disease    a. Myoview 9/16:  EF 52%, inferior fixed defect consistent with diaphragmatic attenuation, intermediate risk secondary to poor exercise tolerance and symptoms during stress;  b. LHC 05/17/2015 90% mid LAD, 80% ost D2, 75% mid RCA, 35% prox RCA. >> S/p CABG  . Diabetes mellitus age 63  . Diverticulitis   . GERD (gastroesophageal reflux disease)   . H/O hiatal hernia   . History of echocardiogram    a. Echo 9/16: GLS -15.2%, EF 83-15%, grade 1 diastolic dysfunction, normal wall motion, aortic sclerosis, dilated aortic root 39 mm, atrial septal lipomatous hypertrophy  . Hyperlipidemia   . Hypertension   . Irritable bowel syndrome (IBS)   . Myocardial infarction [facility])    silent inferior MI; patient denies MI history (03/17/13)   . Neuropathy 2013  . Osteoporosis 2013  . PAF (paroxysmal atrial fibrillation) ([place])    post CABG; Amiodarone stopped 2/2 wheezing;   . Peripheral vascular disease ([place])   . Reflux esophagitis   . Sleep apnea    uses CPAP  . Stroke [facility]) 1987   Right brain stroke  . Wears glasses     Past Surgical History:  Procedure Laterality Date  . CARDIAC CATHETERIZATION N/A 05/17/2015   Procedure:  Left Heart Cath and Coronary Angiography;  Surgeon: Belva Crome, MD;  Location: [place] [facility];  Service: Cardiovascular;  Laterality: N/A;  . CAROTID ENDARTERECTOMY  1994 & redo 2001   Left  . COLONOSCOPY W/ BIOPSIES AND POLYPECTOMY    . CORONARY ARTERY BYPASS GRAFT N/A 05/23/2015   Procedure: CORONARY ARTERY BYPASS GRAFTING (CABG) x 3 (LIMA to LAD, SVG to DIAGONAL 2, SVG to PDA) with Endoscopic Vein Harvesting from right greater saphenous vein;  Surgeon: Grace Isaac, MD;  Location: [place];  Service: Open Heart Surgery;  Laterality: N/A;  . FRACTURE SURGERY  2013   Right   foo  t X's 2  . ILIAC ARTERY STENT    . POSTERIOR LUMBAR FUSION  Aug. 14, 2014   Level 1  . PR VEIN BYPASS GRAFT,AORTO-FEM-POP  1998  . [place] SURGERY  2014  . TEE WITHOUT CARDIOVERSION N/A 05/23/2015   Procedure: TRANSESOPHAGEAL ECHOCARDIOGRAM (TEE);  Surgeon: Grace Isaac, MD;  Location: [place];  Service: Open Heart Surgery;  Laterality: N/A;  . TONSILLECTOMY      Family History  Problem Relation Age of Onset  . Cancer Mother 17       pancreatic  . Hyperlipidemia Mother   . Stroke Father 5  . Deep vein thrombosis Father   .  Hyperlipidemia Father   . Hypertension Father    Social History:  reports that he quit smoking about 8 years ago. His smoking use included cigarettes. He has a 80.00 pack-year smoking history. he has never used smokeless tobacco. He reports that he does not drink alcohol or use drugs.  Allergies:  Allergies  Allergen Reactions  . Hydrocodone Nausea Only  . Oxycodone Nausea Only  . Penicillins Rash    Has patient had a PCN reaction causing immediate rash, facial/tongue/throat swelling, SOB or lightheadedness with hypotension: NO Has patient had a PCN reaction causing severe rash involving mucus membranes or skin necrosis:NO Has patient had a PCN reaction that required hospitalization NO Has patient had a PCN reaction occurring within the last 10 years: NO If all of the  above answers are "NO", then may proceed with Cephalosporin use.   . Sulfa Drugs Cross Reactors Rash    No medications prior to admission.    No results found for this or any previous visit (from the past 48 hour(s)). No results found.  Review of Systems  All other systems reviewed and are negative.   Height 6' (1.829 m), weight 95.3 kg (210 lb). Physical Exam  Constitutional:  WD, WN, NAD HEENT:  NCAT, EOMI Neuro/Psych:  Alert & oriented to person, place, and time; appropriate mood & affect Lymphatic: No generalized UE edema or lymphadenopathy Extremities / MSK:  Both UE are normal with respect to appearance, ranges of motion, joint stability, muscle strength/tone, sensation, & perfusion except as otherwise noted:  TTP bilaterally over her right long finger A1 pulley and left index finger A1 pulley.  Right long finger flexion is not full, both have inducible catching.  Labs / X-rays:  No radiographic studies obtained today.  Assessment: 1.  Left index and right long and ring finger stenosing tenosynovitis-improved with injection: 10/02/2016, 11/2016, 07/08/2017  2.  Right thumb pain, possibly of TMC origin  Procedure:  The site was prepped with alcohol, topically anesthetized with ethyl chloride, and  1.0 mL generic Celestone / 0.5 mL lidocaine was injected through a 22-gauge needle.  A portion of the medication was delivered within the flexor tendon sheath, and the remainder in the subcutaneous space.  A Band-Aid was applied.  Right long finger and left index finger were injected.  Plan:  Findings were discussed with the patient.  The patient wishes to move ahead with right long finger trigger release.The details of the operative procedure were discussed with the patient.  Questions were invited and answered.  In addition to the goal of the procedure, the risks of the procedure to include but not limited to bleeding; infection; damage to the nerves or blood vessels that could  result in bleeding, numbness, weakness, chronic pain, and the need for additional procedures; stiffness; the need for revision surgery; and anesthetic risks were reviewed.  No specific outcome was guaranteed or implied.  Informed consent was obtained.   Jolyn Nap, MD 07/14/2017, 8:38 AM

## 2017-07-14 NOTE — Interval H&P Note (Signed)
History and Physical Interval Note:  07/14/2017 1:17 PM  Bradley [name]  has presented today for surgery, with the diagnosis of RIGHT LONG FINGER TRIGGER DIGIT M65.331  The various methods of treatment have been discussed with the patient and family. After consideration of risks, benefits and other options for treatment, the patient has consented to  Procedure(s): RIGHT LONG FINGER TRIGGER RELEASE (Right) as a surgical intervention .  The patient's history has been reviewed, patient examined, no change in status, stable for surgery.  I have reviewed the patient's chart and labs.  Questions were answered to the patient's satisfaction.     Jolyn Nap

## 2017-07-14 NOTE — Anesthesia Postprocedure Evaluation (Signed)
Anesthesia Post Note  Patient: Bradley [name]  Procedure(s) Performed: RIGHT LONG FINGER TRIGGER RELEASE (Right Middle Finger)     Patient location during evaluation: PACU Anesthesia Type: MAC Level of consciousness: awake and alert Pain management: pain level controlled Vital Signs Assessment: post-procedure vital signs reviewed and stable Respiratory status: spontaneous breathing, nonlabored ventilation, respiratory function stable and patient connected to nasal cannula oxygen Cardiovascular status: stable and blood pressure returned to baseline Postop Assessment: no apparent nausea or vomiting Anesthetic complications: no    Last Vitals:  Vitals:   07/14/17 1545 07/14/17 1600  BP: (!) 148/83 133/79  Pulse: 69 69  Resp: 14 13  Temp:    SpO2: 97% 98%    Last Pain:  Vitals:   07/14/17 1530  TempSrc:   PainSc: 0-No pain                 Tiajuana Amass

## 2017-07-15 ENCOUNTER — Encounter (HOSPITAL_BASED_OUTPATIENT_CLINIC_OR_DEPARTMENT_OTHER): Payer: Self-pay | Admitting: Orthopedic Surgery

## 2017-07-17 DIAGNOSIS — D225 Melanocytic nevi of trunk: Secondary | ICD-10-CM | POA: Diagnosis not present

## 2017-07-17 DIAGNOSIS — D2272 Melanocytic nevi of left lower limb, including hip: Secondary | ICD-10-CM | POA: Diagnosis not present

## 2017-07-17 DIAGNOSIS — X32XXXA Exposure to sunlight, initial encounter: Secondary | ICD-10-CM | POA: Diagnosis not present

## 2017-07-17 DIAGNOSIS — D2261 Melanocytic nevi of right upper limb, including shoulder: Secondary | ICD-10-CM | POA: Diagnosis not present

## 2017-07-17 DIAGNOSIS — L570 Actinic keratosis: Secondary | ICD-10-CM | POA: Diagnosis not present

## 2017-07-17 DIAGNOSIS — Z85828 Personal history of other malignant neoplasm of skin: Secondary | ICD-10-CM | POA: Diagnosis not present

## 2017-07-27 DIAGNOSIS — E1142 Type 2 diabetes mellitus with diabetic polyneuropathy: Secondary | ICD-10-CM | POA: Diagnosis not present

## 2017-07-27 DIAGNOSIS — Z7984 Long term (current) use of oral hypoglycemic drugs: Secondary | ICD-10-CM | POA: Diagnosis not present

## 2017-07-27 DIAGNOSIS — G479 Sleep disorder, unspecified: Secondary | ICD-10-CM | POA: Diagnosis not present

## 2017-07-27 DIAGNOSIS — K649 Unspecified hemorrhoids: Secondary | ICD-10-CM | POA: Diagnosis not present

## 2017-07-28 DIAGNOSIS — M65331 Trigger finger, right middle finger: Secondary | ICD-10-CM | POA: Diagnosis not present

## 2017-07-30 DIAGNOSIS — R809 Proteinuria, unspecified: Secondary | ICD-10-CM | POA: Diagnosis not present

## 2017-07-30 DIAGNOSIS — I639 Cerebral infarction, unspecified: Secondary | ICD-10-CM | POA: Diagnosis not present

## 2017-07-30 DIAGNOSIS — G4733 Obstructive sleep apnea (adult) (pediatric): Secondary | ICD-10-CM | POA: Diagnosis not present

## 2017-07-30 DIAGNOSIS — E785 Hyperlipidemia, unspecified: Secondary | ICD-10-CM | POA: Diagnosis not present

## 2017-07-30 DIAGNOSIS — I129 Hypertensive chronic kidney disease with stage 1 through stage 4 chronic kidney disease, or unspecified chronic kidney disease: Secondary | ICD-10-CM | POA: Diagnosis not present

## 2017-07-30 DIAGNOSIS — E1122 Type 2 diabetes mellitus with diabetic chronic kidney disease: Secondary | ICD-10-CM | POA: Diagnosis not present

## 2017-07-30 DIAGNOSIS — N183 Chronic kidney disease, stage 3 (moderate): Secondary | ICD-10-CM | POA: Diagnosis not present

## 2017-07-30 DIAGNOSIS — I2510 Atherosclerotic heart disease of native coronary artery without angina pectoris: Secondary | ICD-10-CM | POA: Diagnosis not present

## 2017-09-02 DIAGNOSIS — M65331 Trigger finger, right middle finger: Secondary | ICD-10-CM | POA: Diagnosis not present

## 2017-09-09 ENCOUNTER — Encounter (HOSPITAL_COMMUNITY): Payer: PPO

## 2017-09-09 ENCOUNTER — Ambulatory Visit: Payer: PPO | Admitting: Family

## 2017-09-11 ENCOUNTER — Encounter: Payer: Self-pay | Admitting: Family

## 2017-09-11 ENCOUNTER — Ambulatory Visit (HOSPITAL_COMMUNITY)
Admission: RE | Admit: 2017-09-11 | Discharge: 2017-09-11 | Disposition: A | Discharge status: Home / Self Care | Payer: PPO | Source: Ambulatory Visit | Attending: Family | Admitting: Family

## 2017-09-11 ENCOUNTER — Ambulatory Visit (INDEPENDENT_AMBULATORY_CARE_PROVIDER_SITE_OTHER): Payer: PPO | Admitting: Family

## 2017-09-11 ENCOUNTER — Ambulatory Visit (INDEPENDENT_AMBULATORY_CARE_PROVIDER_SITE_OTHER)
Admission: RE | Admit: 2017-09-11 | Discharge: 2017-09-11 | Disposition: A | Discharge status: Home / Self Care | Payer: PPO | Source: Ambulatory Visit | Attending: Family | Admitting: Family

## 2017-09-11 VITALS — BP 121/72 | HR 68 | Temp 96.8°F | Resp 16 | Ht 72.0 in | Wt 215.0 lb

## 2017-09-11 DIAGNOSIS — Z9889 Other specified postprocedural states: Secondary | ICD-10-CM

## 2017-09-11 DIAGNOSIS — I6523 Occlusion and stenosis of bilateral carotid arteries: Secondary | ICD-10-CM | POA: Insufficient documentation

## 2017-09-11 DIAGNOSIS — I779 Disorder of arteries and arterioles, unspecified: Secondary | ICD-10-CM

## 2017-09-11 DIAGNOSIS — Z95828 Presence of other vascular implants and grafts: Secondary | ICD-10-CM

## 2017-09-11 NOTE — Patient Instructions (Signed)
Stroke Prevention Some health problems and behaviors may make it more likely for you to have a stroke. Below are ways to lessen your risk of having a stroke.  Be active for at least 30 minutes on most or all days.  Do not smoke. Try not to be around others who smoke.  Do not drink too much alcohol. ? Do not have more than 2 drinks a day if you are a man. ? Do not have more than 1 drink a day if you are a woman and are not pregnant.  Eat healthy foods, such as fruits and vegetables. If you were put on a specific diet, follow the diet as told.  Keep your cholesterol levels under control through diet and medicines. Look for foods that are low in saturated fat, trans fat, cholesterol, and are high in fiber.  If you have diabetes, follow all diet plans and take your medicine as told.  Ask your doctor if you need treatment to lower your blood pressure. If you have high blood pressure (hypertension), follow all diet plans and take your medicine as told by your doctor.  If you are 18-39 years old, have your blood pressure checked every 3-5 years. If you are age 40 or older, have your blood pressure checked every year.  Keep a healthy weight. Eat foods that are low in calories, salt, saturated fat, trans fat, and cholesterol.  Do not take drugs.  Avoid birth control pills, if this applies. Talk to your doctor about the risks of taking birth control pills.  Talk to your doctor if you have sleep problems (sleep apnea).  Take all medicine as told by your doctor. ? You may be told to take aspirin or blood thinner medicine. Take this medicine as told by your doctor. ? Understand your medicine instructions.  Make sure any other conditions you have are being taken care of.  Get help right away if:  You suddenly lose feeling (you feel numb) or have weakness in your face, arm, or leg.  Your face or eyelid hangs down to one side.  You suddenly feel confused.  You have trouble talking  (aphasia) or understanding what people are saying.  You suddenly have trouble seeing in one or both eyes.  You suddenly have trouble walking.  You are dizzy.  You lose your balance or your movements are clumsy (uncoordinated).  You suddenly have a very bad headache and you do not know the cause.  You have new chest pain.  Your heart feels like it is fluttering or skipping a beat (irregular heartbeat). Do not wait to see if the symptoms above go away. Get help right away. Call your local emergency services (911 in [place] Do not drive yourself to the hospital. This information is not intended to replace advice given to you by your health care provider. Make sure you discuss any questions you have with your health care provider. Document Released: 01/27/2012 Document Revised: 01/03/2016 Document Reviewed: 01/28/2013 Elsevier Interactive Patient Education  2018 [employer]     Peripheral Vascular Disease Peripheral vascular disease (PVD) is a disease of the blood vessels that are not part of your heart and brain. A simple term for PVD is poor circulation. In most cases, PVD narrows the blood vessels that carry blood from your heart to the rest of your body. This can result in a decreased supply of blood to your arms, legs, and internal organs, like your stomach or kidneys. However, it most often affects   a person's lower legs and feet. There are two types of PVD.  Organic PVD. This is the more common type. It is caused by damage to the structure of blood vessels.  Functional PVD. This is caused by conditions that make blood vessels contract and tighten (spasm).  Without treatment, PVD tends to get worse over time. PVD can also lead to acute ischemic limb. This is when an arm or limb suddenly has trouble getting enough blood. This is a medical emergency. Follow these instructions at home:  Take medicines only as told by your doctor.  Do not use any tobacco products, including  cigarettes, chewing tobacco, or electronic cigarettes. If you need help quitting, ask your doctor.  Lose weight if you are overweight, and maintain a healthy weight as told by your doctor.  Eat a diet that is low in fat and cholesterol. If you need help, ask your doctor.  Exercise regularly. Ask your doctor for some good activities for you.  Take good care of your feet. ? Wear comfortable shoes that fit well. ? Check your feet often for any cuts or sores. Contact a doctor if:  You have cramps in your legs while walking.  You have leg pain when you are at rest.  You have coldness in a leg or foot.  Your skin changes.  You are unable to get or have an erection (erectile dysfunction).  You have cuts or sores on your feet that are not healing. Get help right away if:  Your arm or leg turns cold and blue.  Your arms or legs become red, warm, swollen, painful, or numb.  You have chest pain or trouble breathing.  You suddenly have weakness in your face, arm, or leg.  You become very confused or you cannot speak.  You suddenly have a very bad headache.  You suddenly cannot see. This information is not intended to replace advice given to you by your health care provider. Make sure you discuss any questions you have with your health care provider. Document Released: 10/22/2009 Document Revised: 01/03/2016 Document Reviewed: 01/05/2014 Elsevier Interactive Patient Education  2017 [employer].  

## 2017-09-11 NOTE — Progress Notes (Signed)
[facility] [place] HISTORY AND PHYSICAL   CC: Follow up extracranial carotid artery stenosis and peripheral artery occlusive disease   History of Present Illness:   Bradley [name] is a 72 y.o. male returns today for followup of his diffuse peripheral vascular occlusive disease. He undergone prior aortobifemoral bypass for lower extremity arterial occlusive disease in 1998 by Dr. Donnetta Hutching. He underwent a left carotid endarterectomy in 1994 with a redo for recurrent stenosis in 2001 by Dr. Donnetta Hutching.  He report calves feeling tired after walking about 30 minutes on his treadmill, resolves with rest, denies non healing wounds. He walks 30 minutes daily, 3-4 days/week, on his treadmill.   Pt reports stroke at age 34, then several TIA's, the last TIA was the night before the 2001 redo of the left CEA.  He had a presyncopal feeling on a hot day in July 2018 playing golf.   He had 3 vessel CABG on 05/23/15 by Dr. Servando Snare.  Pt states he did not have an MI, but was tired all the time; stress test showed the degree of CAD. He continues cardiac rehab by seeing a physical trainer.    He sees a nephrologist, was found to have stage 3 CKD, metformin was stopped, his hyperglycemia worsened, is working with his PCP to adjust his DM medications.    Pt Diabetic: Yes, states A1C is about 7.3. He has tried a couple of GLP1 medications  Pt smoker: former smoker, quit in 2010  Pt meds include: Statin :Yes Betablocker: Yes ASA: Yes Other anticoagulants/antiplatelets: Plavix    Current Outpatient Medications  Medication Sig Dispense Refill  . acetaminophen (TYLENOL) 325 MG tablet Take 2 tablets (650 mg total) by mouth every 6 (six) hours as needed for mild pain or moderate pain.    . Ascorbic Acid (VITAMIN C) 100 MG tablet Take 100 mg by mouth daily.    Marland Kitchen aspirin EC 81 MG EC tablet Take 1 tablet (81 mg total) by mouth daily.    Marland Kitchen atorvastatin (LIPITOR) 40 MG tablet Take 40 mg by  mouth daily.      . Calcium Carbonate-Vitamin D (CALCIUM-D) 600-400 MG-UNIT TABS Take 1 tablet by mouth daily.     . Cholecalciferol (D-3-5) 5000 UNITS capsule Take 5,000 Units by mouth daily.    . clopidogrel (PLAVIX) 75 MG tablet Take 1 tablet (75 mg total) by mouth daily. 1 tablet 0  . dexlansoprazole (DEXILANT) 60 MG capsule Take 60 mg by mouth daily.    Marland Kitchen glimepiride (AMARYL) 2 MG tablet Take 2 mg by mouth daily before breakfast.      . losartan-hydrochlorothiazide (HYZAAR) 50-12.5 MG tablet TAKE 1 TABLET BY MOUTH EVERY DAY 90 tablet 2  . metoprolol tartrate (LOPRESSOR) 50 MG tablet Take 1 tablet (50 mg total) by mouth 2 (two) times daily. 180 tablet 1  . Multiple Vitamin (MULTIVITAMIN) capsule Take 1 capsule by mouth daily.      . pioglitazone (ACTOS) 30 MG tablet Take 30 mg by mouth daily.    . ranitidine (ZANTAC) 300 MG tablet Take 300 mg by mouth at bedtime.    Marland Kitchen HYDROmorphone (DILAUDID) 2 MG tablet Take 1 tablet (2 mg total) by mouth every 6 (six) hours as needed for severe pain. (Patient not taking: Reported on 09/11/2017) 20 tablet 0  . L-Methylfolate-Algae-B12-B6 (FOLTANX RF PO) Take 300 mg by mouth.     No current facility-administered medications for this visit.     Past Medical History:  Diagnosis  Date  . Anemia   . Arthritis   . Carotid artery occlusion    a. Carotid US 10/16: RICA 60-79%, L CEA patent with 1-39% stenosis  . Coronary artery disease    a. Myoview 9/16:  EF 52%, inferior fixed defect consistent with diaphragmatic attenuation, intermediate risk secondary to poor exercise tolerance and symptoms during stress;  b. LHC 05/17/2015 90% mid LAD, 80% ost D2, 75% mid RCA, 35% prox RCA. >> S/p CABG  . Diabetes mellitus age 54  . Diverticulitis   . GERD (gastroesophageal reflux disease)   . H/O hiatal hernia   . History of echocardiogram    a. Echo 9/16: GLS -15.2%, EF 87-86%, grade 1 diastolic dysfunction, normal wall motion, aortic sclerosis, dilated aortic root 39  mm, atrial septal lipomatous hypertrophy  . Hyperlipidemia   . Hypertension   . Irritable bowel syndrome (IBS)   . Myocardial infarction [facility])    silent inferior MI; patient denies MI history (03/17/13)   . Neuropathy 2013  . Osteoporosis 2013  . PAF (paroxysmal atrial fibrillation) ([place])    post CABG; Amiodarone stopped 2/2 wheezing;   . Peripheral vascular disease ([place])   . Reflux esophagitis   . Sleep apnea    uses CPAP  . Stroke [facility]) 1987   Right brain stroke  . Wears glasses     Social History Social History   Tobacco Use  . Smoking status: Former Smoker    Packs/day: 2.00    Years: 40.00    Pack years: 80.00    Types: Cigarettes    Last attempt to quit: 03/26/2009    Years since quitting: 8.4  . Smokeless tobacco: Never Used  Substance Use Topics  . Alcohol use: No  . Drug use: No    Family History Family History  Problem Relation Age of Onset  . Cancer Mother 76       pancreatic  . Hyperlipidemia Mother   . Stroke Father 52  . Deep vein thrombosis Father   . Hyperlipidemia Father   . Hypertension Father     Surgical History Past Surgical History:  Procedure Laterality Date  . CARDIAC CATHETERIZATION N/A 05/17/2015   Procedure: Left Heart Cath and Coronary Angiography;  Surgeon: Belva Crome, MD;  Location: [place] [facility];  Service: Cardiovascular;  Laterality: N/A;  . CAROTID ENDARTERECTOMY  1994 & redo 2001   Left  . COLONOSCOPY W/ BIOPSIES AND POLYPECTOMY    . CORONARY ARTERY BYPASS GRAFT N/A 05/23/2015   Procedure: CORONARY ARTERY BYPASS GRAFTING (CABG) x 3 (LIMA to LAD, SVG to DIAGONAL 2, SVG to PDA) with Endoscopic Vein Harvesting from right greater saphenous vein;  Surgeon: Grace Isaac, MD;  Location: [place];  Service: Open Heart Surgery;  Laterality: N/A;  . FRACTURE SURGERY  2013   Right   foo  t X's 2  . ILIAC ARTERY STENT    . LAPAROSCOPIC CHOLECYSTECTOMY  09/11/2016  . POSTERIOR LUMBAR FUSION  Aug. 14, 2014   Level 1  . PR VEIN  BYPASS GRAFT,AORTO-FEM-POP  1998  . [place] SURGERY  2014  . TEE WITHOUT CARDIOVERSION N/A 05/23/2015   Procedure: TRANSESOPHAGEAL ECHOCARDIOGRAM (TEE);  Surgeon: Grace Isaac, MD;  Location: [place];  Service: Open Heart Surgery;  Laterality: N/A;  . TONSILLECTOMY    . TRIGGER FINGER RELEASE Right 07/14/2017   Procedure: RIGHT LONG FINGER TRIGGER RELEASE;  Surgeon: Milly Jakob, MD;  Location: [place];  Service: Orthopedics;  Laterality:  Right;    Allergies  Allergen Reactions  . Hydrocodone Nausea Only  . Oxycodone Nausea Only  . Penicillins Rash    Has patient had a PCN reaction causing immediate rash, facial/tongue/throat swelling, SOB or lightheadedness with hypotension: NO Has patient had a PCN reaction causing severe rash involving mucus membranes or skin necrosis:NO Has patient had a PCN reaction that required hospitalization NO Has patient had a PCN reaction occurring within the last 10 years: NO If all of the above answers are "NO", then may proceed with Cephalosporin use.   . Sulfa Drugs Cross Reactors Rash    Current Outpatient Medications  Medication Sig Dispense Refill  . acetaminophen (TYLENOL) 325 MG tablet Take 2 tablets (650 mg total) by mouth every 6 (six) hours as needed for mild pain or moderate pain.    . Ascorbic Acid (VITAMIN C) 100 MG tablet Take 100 mg by mouth daily.    Marland Kitchen aspirin EC 81 MG EC tablet Take 1 tablet (81 mg total) by mouth daily.    Marland Kitchen atorvastatin (LIPITOR) 40 MG tablet Take 40 mg by mouth daily.      . Calcium Carbonate-Vitamin D (CALCIUM-D) 600-400 MG-UNIT TABS Take 1 tablet by mouth daily.     . Cholecalciferol (D-3-5) 5000 UNITS capsule Take 5,000 Units by mouth daily.    . clopidogrel (PLAVIX) 75 MG tablet Take 1 tablet (75 mg total) by mouth daily. 1 tablet 0  . dexlansoprazole (DEXILANT) 60 MG capsule Take 60 mg by mouth daily.    Marland Kitchen glimepiride (AMARYL) 2 MG tablet Take 2 mg by mouth daily before breakfast.      .  losartan-hydrochlorothiazide (HYZAAR) 50-12.5 MG tablet TAKE 1 TABLET BY MOUTH EVERY DAY 90 tablet 2  . metoprolol tartrate (LOPRESSOR) 50 MG tablet Take 1 tablet (50 mg total) by mouth 2 (two) times daily. 180 tablet 1  . Multiple Vitamin (MULTIVITAMIN) capsule Take 1 capsule by mouth daily.      . pioglitazone (ACTOS) 30 MG tablet Take 30 mg by mouth daily.    . ranitidine (ZANTAC) 300 MG tablet Take 300 mg by mouth at bedtime.    Marland Kitchen HYDROmorphone (DILAUDID) 2 MG tablet Take 1 tablet (2 mg total) by mouth every 6 (six) hours as needed for severe pain. (Patient not taking: Reported on 09/11/2017) 20 tablet 0  . L-Methylfolate-Algae-B12-B6 (FOLTANX RF PO) Take 300 mg by mouth.     No current facility-administered medications for this visit.      REVIEW OF SYSTEMS: See HPI for pertinent positives and negatives.  Physical Examination Vitals:   09/11/17 1600 09/11/17 1602  BP: (!) 143/81 121/72  Pulse: 68   Resp: 16   Temp: (!) 96.8 F (36 C)   TempSrc: Oral   SpO2: 96%   Weight: 215 lb (97.5 kg)   Height: 6' (1.829 m)    Body mass index is 29.16 kg/m.  General:  WDWN male in NAD Gait: Normal HENT: Large neck, no thyromegaly  Eyes: PERRLA Pulmonary: normal non-labored breathing, good air movement in all fields, no rales, rhonchi,or wheezing Cardiac: Regular rhythm, no murmur detected Abdomen: soft, NT, no palpable masses. Skin: no rashes, no ulcers, no cellulitis.  VASCULAR EXAM  Carotid Bruits Left Right   Negative Negative   Radial pulses are 2+ palpable and =. Abdominal aortic pulse is not palpable.   VASCULAR EXAM: Extremitieswithout ischemic changes  without Gangrene; without open wounds; feet appear well perfused, all toes are pink and warm.  LE Pulses LEFT RIGHT    FEMORAL 1+ palpable  1+ palpable    POPLITEAL not palpable  not palpable   POSTERIOR TIBIAL not palpable  not palpable    DORSALIS PEDIS  ANTERIOR TIBIAL not palpable  not palpable     Musculoskeletal: no muscle wasting or atrophy; no peripheral edema.Moderate kyphosis at distal cervical/proximal thoracic spine. Neurologic:A&O X 3; appropriate affect, sensation is normal bilaterally, M.OTOR FUNCTION: 5/5 Symmetric, CN 2-12 intact except for some hearing loss, speech is fluent/normal. Psychiatric: Normal thought content, mood appropriate to clinical situation.    ASSESSMENT:  Bradley [name] is a 72 y.o. male who is s/p aortobifemoral bypass for lower extremity arterial occlusive disease in 1998. He underwent a left carotid endarterectomy in 1994 with a redo for recurrent stenosis in 2001. He had a stroke at age 61, then subsequent TIA's until his last TIA in 2001. He has no claudication with walking, no non healing wounds.  His atherosclerotic risk factors include DM that is almost in control and former smoker.  DATA   Carotid Duplex (09/11/17): 40-59% right ICA stenosisandleft carotid endarterectomy site with <40% stenosis at the proximal patch site. Right vertebral artery is antegrade, left vertebral artery is not visualized.  Bilateral subclavian arteries are normal.  No significant change in comparison to the exams on 09-05-15 and 09-09-16.   ABI (Date: 09/11/2017):  R:   ABI: 0.66 (was 0.59 on 09-09-16),   PT: mono  DP: mono  TBI:  0.49 (was 0.43)  L:   ABI: 0.61 (was 0.67),   PT: mono  DP: mono  TBI: 0.53 (was 0.51)  Stable bilateral ABI with moderate disease, all monophasic waveforms.    PLAN:   Continue graduated walking program and exercising with a trainer.    Based on today's exam and non-invasive vascular lab results, the patient will follow up in 1 year with the following  tests: ABI's and carotid duplex.  I advised him to notify us if he develops non healing wounds in his feet/legs or his calves feel worse with walking.   I discussed in depth with the patient the nature of atherosclerosis, and emphasized the importance of maximal medical management including strict control of blood pressure, blood glucose, and lipid levels, obtaining regular exercise, and cessation of smoking.  The patient is aware that without maximal medical management the underlying atherosclerotic disease process will progress, limiting the benefit of any interventions.  The patient was given information about stroke prevention and what symptoms should prompt the patient to seek immediate medical care.  The patient was given information about PAD including signs, symptoms, treatment, what symptoms should prompt the patient to seek immediate medical care, and risk reduction measures to take.  Thank you for allowing [place] to participate in this patient's care.  Clemon Chambers, RN, MSN, FNP-C [facility] Office: [phone]  Clinic MD: Cain/Chen 09/11/2017 4:06 PM

## 2017-09-15 DIAGNOSIS — M9902 Segmental and somatic dysfunction of thoracic region: Secondary | ICD-10-CM | POA: Diagnosis not present

## 2017-09-15 DIAGNOSIS — M9901 Segmental and somatic dysfunction of cervical region: Secondary | ICD-10-CM | POA: Diagnosis not present

## 2017-09-15 DIAGNOSIS — M5033 Other cervical disc degeneration, cervicothoracic region: Secondary | ICD-10-CM | POA: Diagnosis not present

## 2017-09-15 DIAGNOSIS — M5412 Radiculopathy, cervical region: Secondary | ICD-10-CM | POA: Diagnosis not present

## 2017-09-16 DIAGNOSIS — M5033 Other cervical disc degeneration, cervicothoracic region: Secondary | ICD-10-CM | POA: Diagnosis not present

## 2017-09-16 DIAGNOSIS — M9901 Segmental and somatic dysfunction of cervical region: Secondary | ICD-10-CM | POA: Diagnosis not present

## 2017-09-16 DIAGNOSIS — M9902 Segmental and somatic dysfunction of thoracic region: Secondary | ICD-10-CM | POA: Diagnosis not present

## 2017-09-16 DIAGNOSIS — M5412 Radiculopathy, cervical region: Secondary | ICD-10-CM | POA: Diagnosis not present

## 2017-09-16 LAB — VAS US CAROTID
LEFT ECA DIAS: 8 cm/s
LEFT VERTEBRAL DIAS: -5 cm/s
Left CCA dist dias: -20 cm/s
Left CCA dist sys: -68 cm/s
Left CCA prox dias: 27 cm/s
Left CCA prox sys: 89 cm/s
Left ICA dist dias: -12 cm/s
Left ICA dist sys: -36 cm/s
Left ICA prox dias: -17 cm/s
Left ICA prox sys: -66 cm/s
RIGHT CCA MID DIAS: -16 cm/s
RIGHT ECA DIAS: -20 cm/s
RIGHT VERTEBRAL DIAS: -7 cm/s
Right CCA prox dias: 16 cm/s
Right CCA prox sys: 64 cm/s
Right cca dist sys: -28 cm/s

## 2017-09-17 DIAGNOSIS — M5033 Other cervical disc degeneration, cervicothoracic region: Secondary | ICD-10-CM | POA: Diagnosis not present

## 2017-09-17 DIAGNOSIS — M9901 Segmental and somatic dysfunction of cervical region: Secondary | ICD-10-CM | POA: Diagnosis not present

## 2017-09-17 DIAGNOSIS — M5412 Radiculopathy, cervical region: Secondary | ICD-10-CM | POA: Diagnosis not present

## 2017-09-17 DIAGNOSIS — M9902 Segmental and somatic dysfunction of thoracic region: Secondary | ICD-10-CM | POA: Diagnosis not present

## 2017-09-21 DIAGNOSIS — M9902 Segmental and somatic dysfunction of thoracic region: Secondary | ICD-10-CM | POA: Diagnosis not present

## 2017-09-21 DIAGNOSIS — M9901 Segmental and somatic dysfunction of cervical region: Secondary | ICD-10-CM | POA: Diagnosis not present

## 2017-09-21 DIAGNOSIS — H401130 Primary open-angle glaucoma, bilateral, stage unspecified: Secondary | ICD-10-CM | POA: Diagnosis not present

## 2017-09-21 DIAGNOSIS — H2513 Age-related nuclear cataract, bilateral: Secondary | ICD-10-CM | POA: Diagnosis not present

## 2017-09-21 DIAGNOSIS — E119 Type 2 diabetes mellitus without complications: Secondary | ICD-10-CM | POA: Diagnosis not present

## 2017-09-21 DIAGNOSIS — M5412 Radiculopathy, cervical region: Secondary | ICD-10-CM | POA: Diagnosis not present

## 2017-09-21 DIAGNOSIS — H40013 Open angle with borderline findings, low risk, bilateral: Secondary | ICD-10-CM | POA: Diagnosis not present

## 2017-09-21 DIAGNOSIS — M5033 Other cervical disc degeneration, cervicothoracic region: Secondary | ICD-10-CM | POA: Diagnosis not present

## 2017-09-23 DIAGNOSIS — M9902 Segmental and somatic dysfunction of thoracic region: Secondary | ICD-10-CM | POA: Diagnosis not present

## 2017-09-23 DIAGNOSIS — M9901 Segmental and somatic dysfunction of cervical region: Secondary | ICD-10-CM | POA: Diagnosis not present

## 2017-09-23 DIAGNOSIS — M5033 Other cervical disc degeneration, cervicothoracic region: Secondary | ICD-10-CM | POA: Diagnosis not present

## 2017-09-23 DIAGNOSIS — M5412 Radiculopathy, cervical region: Secondary | ICD-10-CM | POA: Diagnosis not present

## 2017-09-24 DIAGNOSIS — M9902 Segmental and somatic dysfunction of thoracic region: Secondary | ICD-10-CM | POA: Diagnosis not present

## 2017-09-24 DIAGNOSIS — M9901 Segmental and somatic dysfunction of cervical region: Secondary | ICD-10-CM | POA: Diagnosis not present

## 2017-09-24 DIAGNOSIS — M5033 Other cervical disc degeneration, cervicothoracic region: Secondary | ICD-10-CM | POA: Diagnosis not present

## 2017-09-24 DIAGNOSIS — M5412 Radiculopathy, cervical region: Secondary | ICD-10-CM | POA: Diagnosis not present

## 2017-09-28 DIAGNOSIS — M9901 Segmental and somatic dysfunction of cervical region: Secondary | ICD-10-CM | POA: Diagnosis not present

## 2017-09-28 DIAGNOSIS — M5033 Other cervical disc degeneration, cervicothoracic region: Secondary | ICD-10-CM | POA: Diagnosis not present

## 2017-09-28 DIAGNOSIS — M5412 Radiculopathy, cervical region: Secondary | ICD-10-CM | POA: Diagnosis not present

## 2017-09-28 DIAGNOSIS — M9902 Segmental and somatic dysfunction of thoracic region: Secondary | ICD-10-CM | POA: Diagnosis not present

## 2017-09-30 DIAGNOSIS — M5033 Other cervical disc degeneration, cervicothoracic region: Secondary | ICD-10-CM | POA: Diagnosis not present

## 2017-09-30 DIAGNOSIS — M5412 Radiculopathy, cervical region: Secondary | ICD-10-CM | POA: Diagnosis not present

## 2017-09-30 DIAGNOSIS — M9901 Segmental and somatic dysfunction of cervical region: Secondary | ICD-10-CM | POA: Diagnosis not present

## 2017-09-30 DIAGNOSIS — M9902 Segmental and somatic dysfunction of thoracic region: Secondary | ICD-10-CM | POA: Diagnosis not present

## 2017-10-01 DIAGNOSIS — M5033 Other cervical disc degeneration, cervicothoracic region: Secondary | ICD-10-CM | POA: Diagnosis not present

## 2017-10-01 DIAGNOSIS — M9901 Segmental and somatic dysfunction of cervical region: Secondary | ICD-10-CM | POA: Diagnosis not present

## 2017-10-01 DIAGNOSIS — M5412 Radiculopathy, cervical region: Secondary | ICD-10-CM | POA: Diagnosis not present

## 2017-10-01 DIAGNOSIS — M9902 Segmental and somatic dysfunction of thoracic region: Secondary | ICD-10-CM | POA: Diagnosis not present

## 2017-10-06 DIAGNOSIS — M9901 Segmental and somatic dysfunction of cervical region: Secondary | ICD-10-CM | POA: Diagnosis not present

## 2017-10-06 DIAGNOSIS — M9902 Segmental and somatic dysfunction of thoracic region: Secondary | ICD-10-CM | POA: Diagnosis not present

## 2017-10-06 DIAGNOSIS — M5033 Other cervical disc degeneration, cervicothoracic region: Secondary | ICD-10-CM | POA: Diagnosis not present

## 2017-10-06 DIAGNOSIS — M5412 Radiculopathy, cervical region: Secondary | ICD-10-CM | POA: Diagnosis not present

## 2017-10-08 DIAGNOSIS — M5033 Other cervical disc degeneration, cervicothoracic region: Secondary | ICD-10-CM | POA: Diagnosis not present

## 2017-10-08 DIAGNOSIS — M9901 Segmental and somatic dysfunction of cervical region: Secondary | ICD-10-CM | POA: Diagnosis not present

## 2017-10-08 DIAGNOSIS — M5412 Radiculopathy, cervical region: Secondary | ICD-10-CM | POA: Diagnosis not present

## 2017-10-08 DIAGNOSIS — M9902 Segmental and somatic dysfunction of thoracic region: Secondary | ICD-10-CM | POA: Diagnosis not present

## 2017-10-12 DIAGNOSIS — M9901 Segmental and somatic dysfunction of cervical region: Secondary | ICD-10-CM | POA: Diagnosis not present

## 2017-10-12 DIAGNOSIS — M5412 Radiculopathy, cervical region: Secondary | ICD-10-CM | POA: Diagnosis not present

## 2017-10-12 DIAGNOSIS — M9902 Segmental and somatic dysfunction of thoracic region: Secondary | ICD-10-CM | POA: Diagnosis not present

## 2017-10-12 DIAGNOSIS — M5033 Other cervical disc degeneration, cervicothoracic region: Secondary | ICD-10-CM | POA: Diagnosis not present

## 2017-10-13 DIAGNOSIS — Z7984 Long term (current) use of oral hypoglycemic drugs: Secondary | ICD-10-CM | POA: Diagnosis not present

## 2017-10-13 DIAGNOSIS — E291 Testicular hypofunction: Secondary | ICD-10-CM | POA: Diagnosis not present

## 2017-10-13 DIAGNOSIS — E1142 Type 2 diabetes mellitus with diabetic polyneuropathy: Secondary | ICD-10-CM | POA: Diagnosis not present

## 2017-10-15 DIAGNOSIS — M5033 Other cervical disc degeneration, cervicothoracic region: Secondary | ICD-10-CM | POA: Diagnosis not present

## 2017-10-15 DIAGNOSIS — M9901 Segmental and somatic dysfunction of cervical region: Secondary | ICD-10-CM | POA: Diagnosis not present

## 2017-10-15 DIAGNOSIS — M9902 Segmental and somatic dysfunction of thoracic region: Secondary | ICD-10-CM | POA: Diagnosis not present

## 2017-10-15 DIAGNOSIS — M5412 Radiculopathy, cervical region: Secondary | ICD-10-CM | POA: Diagnosis not present

## 2017-10-20 DIAGNOSIS — M9902 Segmental and somatic dysfunction of thoracic region: Secondary | ICD-10-CM | POA: Diagnosis not present

## 2017-10-20 DIAGNOSIS — M5412 Radiculopathy, cervical region: Secondary | ICD-10-CM | POA: Diagnosis not present

## 2017-10-20 DIAGNOSIS — M9901 Segmental and somatic dysfunction of cervical region: Secondary | ICD-10-CM | POA: Diagnosis not present

## 2017-10-20 DIAGNOSIS — M5033 Other cervical disc degeneration, cervicothoracic region: Secondary | ICD-10-CM | POA: Diagnosis not present

## 2017-10-20 DIAGNOSIS — G4733 Obstructive sleep apnea (adult) (pediatric): Secondary | ICD-10-CM | POA: Diagnosis not present

## 2017-10-22 DIAGNOSIS — M9902 Segmental and somatic dysfunction of thoracic region: Secondary | ICD-10-CM | POA: Diagnosis not present

## 2017-10-22 DIAGNOSIS — M5033 Other cervical disc degeneration, cervicothoracic region: Secondary | ICD-10-CM | POA: Diagnosis not present

## 2017-10-22 DIAGNOSIS — M5412 Radiculopathy, cervical region: Secondary | ICD-10-CM | POA: Diagnosis not present

## 2017-10-22 DIAGNOSIS — M9901 Segmental and somatic dysfunction of cervical region: Secondary | ICD-10-CM | POA: Diagnosis not present

## 2017-10-26 DIAGNOSIS — N183 Chronic kidney disease, stage 3 (moderate): Secondary | ICD-10-CM | POA: Diagnosis not present

## 2017-10-26 DIAGNOSIS — R809 Proteinuria, unspecified: Secondary | ICD-10-CM | POA: Diagnosis not present

## 2017-10-26 DIAGNOSIS — I639 Cerebral infarction, unspecified: Secondary | ICD-10-CM | POA: Diagnosis not present

## 2017-10-26 DIAGNOSIS — E1122 Type 2 diabetes mellitus with diabetic chronic kidney disease: Secondary | ICD-10-CM | POA: Diagnosis not present

## 2017-10-26 DIAGNOSIS — G4733 Obstructive sleep apnea (adult) (pediatric): Secondary | ICD-10-CM | POA: Diagnosis not present

## 2017-10-26 DIAGNOSIS — I129 Hypertensive chronic kidney disease with stage 1 through stage 4 chronic kidney disease, or unspecified chronic kidney disease: Secondary | ICD-10-CM | POA: Diagnosis not present

## 2017-10-26 DIAGNOSIS — E785 Hyperlipidemia, unspecified: Secondary | ICD-10-CM | POA: Diagnosis not present

## 2017-10-26 DIAGNOSIS — I2510 Atherosclerotic heart disease of native coronary artery without angina pectoris: Secondary | ICD-10-CM | POA: Diagnosis not present

## 2017-10-27 DIAGNOSIS — M5412 Radiculopathy, cervical region: Secondary | ICD-10-CM | POA: Diagnosis not present

## 2017-10-27 DIAGNOSIS — M9901 Segmental and somatic dysfunction of cervical region: Secondary | ICD-10-CM | POA: Diagnosis not present

## 2017-10-27 DIAGNOSIS — M9902 Segmental and somatic dysfunction of thoracic region: Secondary | ICD-10-CM | POA: Diagnosis not present

## 2017-10-27 DIAGNOSIS — M5033 Other cervical disc degeneration, cervicothoracic region: Secondary | ICD-10-CM | POA: Diagnosis not present

## 2017-10-29 DIAGNOSIS — M5412 Radiculopathy, cervical region: Secondary | ICD-10-CM | POA: Diagnosis not present

## 2017-10-29 DIAGNOSIS — M9901 Segmental and somatic dysfunction of cervical region: Secondary | ICD-10-CM | POA: Diagnosis not present

## 2017-10-29 DIAGNOSIS — M5033 Other cervical disc degeneration, cervicothoracic region: Secondary | ICD-10-CM | POA: Diagnosis not present

## 2017-10-29 DIAGNOSIS — M9902 Segmental and somatic dysfunction of thoracic region: Secondary | ICD-10-CM | POA: Diagnosis not present

## 2017-11-02 DIAGNOSIS — M9901 Segmental and somatic dysfunction of cervical region: Secondary | ICD-10-CM | POA: Diagnosis not present

## 2017-11-02 DIAGNOSIS — M9902 Segmental and somatic dysfunction of thoracic region: Secondary | ICD-10-CM | POA: Diagnosis not present

## 2017-11-02 DIAGNOSIS — M5033 Other cervical disc degeneration, cervicothoracic region: Secondary | ICD-10-CM | POA: Diagnosis not present

## 2017-11-02 DIAGNOSIS — M5412 Radiculopathy, cervical region: Secondary | ICD-10-CM | POA: Diagnosis not present

## 2017-11-04 DIAGNOSIS — N183 Chronic kidney disease, stage 3 (moderate): Secondary | ICD-10-CM | POA: Diagnosis not present

## 2017-11-06 DIAGNOSIS — M9902 Segmental and somatic dysfunction of thoracic region: Secondary | ICD-10-CM | POA: Diagnosis not present

## 2017-11-06 DIAGNOSIS — M9901 Segmental and somatic dysfunction of cervical region: Secondary | ICD-10-CM | POA: Diagnosis not present

## 2017-11-06 DIAGNOSIS — M5033 Other cervical disc degeneration, cervicothoracic region: Secondary | ICD-10-CM | POA: Diagnosis not present

## 2017-11-06 DIAGNOSIS — M5412 Radiculopathy, cervical region: Secondary | ICD-10-CM | POA: Diagnosis not present

## 2017-11-13 ENCOUNTER — Other Ambulatory Visit: Payer: Self-pay

## 2017-11-13 ENCOUNTER — Encounter (HOSPITAL_COMMUNITY): Payer: Self-pay | Admitting: *Deleted

## 2017-11-13 ENCOUNTER — Emergency Department (HOSPITAL_COMMUNITY)
Admission: EM | Admit: 2017-11-13 | Discharge: 2017-11-13 | Disposition: A | Discharge status: Home / Self Care | Payer: PPO | Attending: Emergency Medicine | Admitting: Emergency Medicine

## 2017-11-13 ENCOUNTER — Emergency Department (HOSPITAL_COMMUNITY): Payer: PPO

## 2017-11-13 DIAGNOSIS — I252 Old myocardial infarction: Secondary | ICD-10-CM | POA: Insufficient documentation

## 2017-11-13 DIAGNOSIS — Z951 Presence of aortocoronary bypass graft: Secondary | ICD-10-CM | POA: Insufficient documentation

## 2017-11-13 DIAGNOSIS — M545 Low back pain, unspecified: Secondary | ICD-10-CM

## 2017-11-13 DIAGNOSIS — N200 Calculus of kidney: Secondary | ICD-10-CM | POA: Diagnosis not present

## 2017-11-13 DIAGNOSIS — R1031 Right lower quadrant pain: Secondary | ICD-10-CM | POA: Diagnosis not present

## 2017-11-13 DIAGNOSIS — E1140 Type 2 diabetes mellitus with diabetic neuropathy, unspecified: Secondary | ICD-10-CM | POA: Insufficient documentation

## 2017-11-13 DIAGNOSIS — Z7982 Long term (current) use of aspirin: Secondary | ICD-10-CM | POA: Insufficient documentation

## 2017-11-13 DIAGNOSIS — R109 Unspecified abdominal pain: Secondary | ICD-10-CM | POA: Diagnosis present

## 2017-11-13 DIAGNOSIS — R3129 Other microscopic hematuria: Secondary | ICD-10-CM | POA: Insufficient documentation

## 2017-11-13 DIAGNOSIS — Z7902 Long term (current) use of antithrombotics/antiplatelets: Secondary | ICD-10-CM | POA: Diagnosis not present

## 2017-11-13 DIAGNOSIS — R1032 Left lower quadrant pain: Secondary | ICD-10-CM | POA: Diagnosis not present

## 2017-11-13 DIAGNOSIS — Z8673 Personal history of transient ischemic attack (TIA), and cerebral infarction without residual deficits: Secondary | ICD-10-CM | POA: Diagnosis not present

## 2017-11-13 DIAGNOSIS — Z7984 Long term (current) use of oral hypoglycemic drugs: Secondary | ICD-10-CM | POA: Diagnosis not present

## 2017-11-13 DIAGNOSIS — Z87891 Personal history of nicotine dependence: Secondary | ICD-10-CM | POA: Diagnosis not present

## 2017-11-13 DIAGNOSIS — I2510 Atherosclerotic heart disease of native coronary artery without angina pectoris: Secondary | ICD-10-CM | POA: Diagnosis not present

## 2017-11-13 DIAGNOSIS — Z79899 Other long term (current) drug therapy: Secondary | ICD-10-CM | POA: Diagnosis not present

## 2017-11-13 LAB — URINALYSIS, ROUTINE W REFLEX MICROSCOPIC
Bilirubin Urine: NEGATIVE
Glucose, UA: >=500 mg/dL — AB
Ketones, ur: 5 mg/dL — AB
Leukocytes, UA: NEGATIVE
Nitrite: NEGATIVE
Protein, ur: NEGATIVE mg/dL
Specific Gravity, Urine: 1.01 (ref 1.005–1.030)
pH: 6 (ref 5.0–8.0)

## 2017-11-13 LAB — CBC WITH DIFFERENTIAL/PLATELET
Basophils Absolute: 0 10*3/uL (ref 0.0–0.1)
Basophils Relative: 0 %
Eosinophils Absolute: 0.2 10*3/uL (ref 0.0–0.7)
Eosinophils Relative: 2 %
HCT: 47.6 % (ref 39.0–52.0)
Hemoglobin: 15.7 g/dL (ref 13.0–17.0)
Lymphocytes Relative: 18 %
Lymphs Abs: 1.2 10*3/uL (ref 0.7–4.0)
MCH: 33.3 pg (ref 26.0–34.0)
MCHC: 33 g/dL (ref 30.0–36.0)
MCV: 101.1 fL — ABNORMAL HIGH (ref 78.0–100.0)
Monocytes Absolute: 0.7 10*3/uL (ref 0.1–1.0)
Monocytes Relative: 10 %
Neutro Abs: 4.7 10*3/uL (ref 1.7–7.7)
Neutrophils Relative %: 70 %
Platelets: 199 10*3/uL (ref 150–400)
RBC: 4.71 MIL/uL (ref 4.22–5.81)
RDW: 13.3 % (ref 11.5–15.5)
WBC: 6.7 10*3/uL (ref 4.0–10.5)

## 2017-11-13 LAB — COMPREHENSIVE METABOLIC PANEL
ALT: 24 U/L (ref 17–63)
AST: 25 U/L (ref 15–41)
Albumin: 4 g/dL (ref 3.5–5.0)
Alkaline Phosphatase: 43 U/L (ref 38–126)
Anion gap: 16 — ABNORMAL HIGH (ref 5–15)
BUN: 28 mg/dL — ABNORMAL HIGH (ref 6–20)
CO2: 18 mmol/L — ABNORMAL LOW (ref 22–32)
Calcium: 9.4 mg/dL (ref 8.9–10.3)
Chloride: 105 mmol/L (ref 101–111)
Creatinine, Ser: 2.34 mg/dL — ABNORMAL HIGH (ref 0.61–1.24)
GFR calc Af Amer: 30 mL/min — ABNORMAL LOW (ref >60)
GFR calc non Af Amer: 26 mL/min — ABNORMAL LOW (ref >60)
Glucose, Bld: 121 mg/dL — ABNORMAL HIGH (ref 65–99)
Potassium: 4.5 mmol/L (ref 3.5–5.1)
Sodium: 139 mmol/L (ref 135–145)
Total Bilirubin: 1 mg/dL (ref 0.3–1.2)
Total Protein: 6.8 g/dL (ref 6.5–8.1)

## 2017-11-13 LAB — LIPASE, BLOOD: Lipase: 28 U/L (ref 11–51)

## 2017-11-13 LAB — I-STAT CG4 LACTIC ACID, ED: Lactic Acid, Venous: 0.7 mmol/L (ref 0.5–1.9)

## 2017-11-13 MED ORDER — SODIUM CHLORIDE 0.9 % IV BOLUS
1000.0000 mL | Freq: Once | INTRAVENOUS | Status: AC
  Administered 2017-11-13: 1000 mL via INTRAVENOUS

## 2017-11-13 MED ORDER — CYCLOBENZAPRINE HCL 5 MG PO TABS: 5.0000 mg | ORAL_TABLET | Freq: Three times a day (TID) | ORAL | 0 refills | Status: DC | PRN

## 2017-11-13 NOTE — ED Provider Notes (Signed)
[name] Provider Note   CSN: [id number] Arrival date & time: 11/13/17  1407     History   Chief Complaint Chief Complaint  Patient presents with  . Flank Pain    HPI [name] is a 72 y.o. male.  HPI  72 year old male with 2 weeks of bilateral back pain and bilateral lower abd pain. Worse today. Mostly present with movements such as sitting up or bending. Today no back pain but has abd pain. No urinary symptoms. No nausea, vomiting, fever. Pain is mild, only 3/10 now. Has chronic kidney disease, last Cr 2.7 per his nephrologist.   Past Medical History:  Diagnosis Date  . Anemia   . Arthritis   . Carotid artery occlusion    a. Carotid US 10/16: RICA 60-79%, L CEA patent with 1-39% stenosis  . Coronary artery disease    a. Myoview 9/16:  EF 52%, inferior fixed defect consistent with diaphragmatic attenuation, intermediate risk secondary to poor exercise tolerance and symptoms during stress;  b. LHC 05/17/2015 90% mid LAD, 80% ost D2, 75% mid RCA, 35% prox RCA. >> S/p CABG  . Diabetes mellitus age 66  . Diverticulitis   . GERD (gastroesophageal reflux disease)   . H/O hiatal hernia   . History of echocardiogram    a. Echo 9/16: GLS -15.2%, EF 54-62%, grade 1 diastolic dysfunction, normal wall motion, aortic sclerosis, dilated aortic root 39 mm, atrial septal lipomatous hypertrophy  . Hyperlipidemia   . Hypertension   . Irritable bowel syndrome (IBS)   . Myocardial infarction [facility])    silent inferior MI; patient denies MI history (03/17/13)   . Neuropathy 2013  . Osteoporosis 2013  . PAF (paroxysmal atrial fibrillation) ([place])    post CABG; Amiodarone stopped 2/2 wheezing;   . Peripheral vascular disease ([place])   . Reflux esophagitis   . Sleep apnea    uses CPAP  . Stroke [facility]) 1987   Right brain stroke  . Wears glasses     Patient Active Problem List   Diagnosis Date Noted  . Coronary artery disease involving coronary  bypass graft of native heart with angina pectoris ([place])   . PAF (paroxysmal atrial fibrillation) ([place])   . Type 2 diabetes mellitus with vascular disease ([place]) 05/30/2015  . OSA on CPAP 05/30/2015  . Abnormal stress test 05/18/2015  . Diverticulosis of colon without hemorrhage 05/17/2015  . Right bundle branch block 04/30/2015  . Essential hypertension 04/29/2015  . Hyperlipidemia 04/29/2015  . DM type 2, controlled, with complication ([place]) 70/35/0093  . Peripheral vascular disease ([place]) 06/08/2012  . Stress fracture of metatarsal bone 02/18/2012  . Acquired equinus deformity of foot 12/15/2011  . Plantar fasciitis 12/15/2011  . Stenosis of right internal carotid artery with cerebral infarction ([place]) 12/09/2011    Past Surgical History:  Procedure Laterality Date  . CARDIAC CATHETERIZATION N/A 05/17/2015   Procedure: Left Heart Cath and Coronary Angiography;  Surgeon: Belva Crome, MD;  Location: [place] [facility];  Service: Cardiovascular;  Laterality: N/A;  . CAROTID ENDARTERECTOMY  1994 & redo 2001   Left  . COLONOSCOPY W/ BIOPSIES AND POLYPECTOMY    . CORONARY ARTERY BYPASS GRAFT N/A 05/23/2015   Procedure: CORONARY ARTERY BYPASS GRAFTING (CABG) x 3 (LIMA to LAD, SVG to DIAGONAL 2, SVG to PDA) with Endoscopic Vein Harvesting from right greater saphenous vein;  Surgeon: Grace Isaac, MD;  Location: [place];  Service: Open Heart Surgery;  Laterality: N/A;  . FRACTURE SURGERY  2013   Right   foo  t X's 2  . ILIAC ARTERY STENT    . LAPAROSCOPIC CHOLECYSTECTOMY  09/11/2016  . POSTERIOR LUMBAR FUSION  Aug. 14, 2014   Level 1  . PR VEIN BYPASS GRAFT,AORTO-FEM-POP  1998  . [place] SURGERY  2014  . TEE WITHOUT CARDIOVERSION N/A 05/23/2015   Procedure: TRANSESOPHAGEAL ECHOCARDIOGRAM (TEE);  Surgeon: Grace Isaac, MD;  Location: [place];  Service: Open Heart Surgery;  Laterality: N/A;  . TONSILLECTOMY    . TRIGGER FINGER RELEASE Right 07/14/2017   Procedure: RIGHT LONG FINGER  TRIGGER RELEASE;  Surgeon: Milly Jakob, MD;  Location: [place];  Service: Orthopedics;  Laterality: Right;        Home Medications    Prior to Admission medications   Medication Sig Start Date End Date Taking? Authorizing Provider  acetaminophen (TYLENOL) 325 MG tablet Take 2 tablets (650 mg total) by mouth every 6 (six) hours as needed for mild pain or moderate pain. 07/14/17  Yes Milly Jakob, MD  aspirin EC 81 MG EC tablet Take 1 tablet (81 mg total) by mouth daily. 06/04/15  Yes Gold, Wayne E, PA-C  atorvastatin (LIPITOR) 40 MG tablet Take 40 mg by mouth daily.     Yes [provider]  clopidogrel (PLAVIX) 75 MG tablet Take 1 tablet (75 mg total) by mouth daily. 07/13/15  Yes Weaver, Orlandus Borowski T, PA-C  dexlansoprazole (DEXILANT) 60 MG capsule Take 60 mg by mouth every other day.    Yes [provider]  erythromycin ophthalmic ointment Place 1 application into the left eye at bedtime. APPLY IN THE CORNER 09/23/17  Yes [provider]  gabapentin (NEURONTIN) 100 MG capsule Take 200 mg by mouth at bedtime. 11/06/17  Yes [provider]  hydrocortisone (ANUSOL-HC) 2.5 % rectal cream Place 1 application rectally 2 (two) times daily as needed for hemorrhoids or anal itching.   Yes [provider]  JARDIANCE 25 MG TABS tablet Take 25 mg by mouth daily. 09/21/17  Yes [provider]  L-Methylfolate-Algae-B12-B6 (FOLTANX RF PO) Take 1 tablet by mouth daily.    Yes [provider]  metoprolol tartrate (LOPRESSOR) 50 MG tablet Take 1 tablet (50 mg total) by mouth 2 (two) times daily. 05/15/17  Yes Belva Crome, MD  pioglitazone (ACTOS) 30 MG tablet Take 30 mg by mouth daily.   Yes [provider]  ranitidine (ZANTAC) 75 MG tablet Take 75 mg by mouth every other day.   Yes [provider]  Testosterone Cypionate 200 MG/ML KIT Inject 200 mg into the muscle every 7 (seven) days.   Yes [provider]  cyclobenzaprine (FLEXERIL) 5 MG tablet Take 1 tablet (5 mg total) by mouth 3 (three) times daily as needed for muscle spasms. 11/13/17   Sherwood Gambler, MD  glimepiride (AMARYL) 2 MG tablet Take 2 mg by mouth daily before breakfast.      [provider]  HYDROmorphone (DILAUDID) 2 MG tablet Take 1 tablet (2 mg total) by mouth every 6 (six) hours as needed for severe pain. Patient not taking: Reported on 11/13/2017 07/14/17 07/14/18  Milly Jakob, MD  losartan-hydrochlorothiazide [facility]) 50-12.5 MG tablet TAKE 1 TABLET BY MOUTH EVERY DAY 03/03/17   Belva Crome, MD    Family History Family History  Problem Relation Age of Onset  . Cancer Mother 71       pancreatic  . Hyperlipidemia Mother   .  Stroke Father 40  . Deep vein thrombosis Father   . Hyperlipidemia Father   . Hypertension Father     Social History Social History   Tobacco Use  . Smoking status: Former Smoker    Packs/day: 2.00    Years: 40.00    Pack years: 80.00    Types: Cigarettes    Last attempt to quit: 03/26/2009    Years since quitting: 8.6  . Smokeless tobacco: Never Used  Substance Use Topics  . Alcohol use: No  . Drug use: No     Allergies   Hydrocodone; Oxycodone; Penicillins; and Sulfa drugs cross reactors   Review of Systems Review of Systems  Constitutional: Negative for fever.  Respiratory: Negative for shortness of breath.   Cardiovascular: Negative for chest pain.  Gastrointestinal: Positive for abdominal pain. Negative for diarrhea, nausea and vomiting.  Genitourinary: Negative for dysuria and hematuria.  Musculoskeletal: Positive for back pain.  Neurological: Negative for weakness and numbness.  All other systems reviewed and are negative.    Physical Exam Updated Vital Signs BP (!) 170/83 (BP Location: Right Arm)   Pulse 70   Temp 97.6 F (36.4 C) (Oral)   Resp 14   SpO2 100%   Physical Exam  Constitutional: He is oriented to person, place, and time. He appears  well-developed and well-nourished.  HENT:  Head: Normocephalic and atraumatic.  Right Ear: External ear normal.  Left Ear: External ear normal.  Nose: Nose normal.  Eyes: Right eye exhibits no discharge. Left eye exhibits no discharge.  Neck: Neck supple.  Cardiovascular: Normal rate, regular rhythm and normal heart sounds.  Pulmonary/Chest: Effort normal and breath sounds normal.  Abdominal: Soft. There is tenderness in the right lower quadrant and left lower quadrant.  Musculoskeletal: He exhibits no edema.       Lumbar back: He exhibits tenderness (mild). He exhibits no bony tenderness.       Back:  Neurological: He is alert and oriented to person, place, and time.  5/5 strength in BLE. Normal gross sensation  Skin: Skin is warm and dry.  Nursing note and vitals reviewed.    ED Treatments / Results  Labs (all labs ordered are listed, but only abnormal results are displayed) Labs Reviewed  URINALYSIS, ROUTINE W REFLEX MICROSCOPIC - Abnormal; Notable for the following components:      Result Value   Glucose, UA >=500 (*)    Hgb urine dipstick SMALL (*)    Ketones, ur 5 (*)    Bacteria, UA RARE (*)    Squamous Epithelial / LPF 0-5 (*)    All other components within normal limits  CBC WITH DIFFERENTIAL/PLATELET - Abnormal; Notable for the following components:   MCV 101.1 (*)    All other components within normal limits  COMPREHENSIVE METABOLIC PANEL - Abnormal; Notable for the following components:   CO2 18 (*)    Glucose, Bld 121 (*)    BUN 28 (*)    Creatinine, Ser 2.34 (*)    GFR calc non Af Amer 26 (*)    GFR calc Af Amer 30 (*)    Anion gap 16 (*)    All other components within normal limits  LIPASE, BLOOD  I-STAT CG4 LACTIC ACID, ED    EKG None  Radiology Ct Renal Stone Study  Result Date: 11/13/2017 CLINICAL DATA:  Bilateral flank pain persisting for 2 weeks concern for nephrolithiasis. EXAM: CT ABDOMEN AND PELVIS WITHOUT CONTRAST TECHNIQUE: Multidetector  CT imaging of the  abdomen and pelvis was performed following the standard protocol without IV contrast. COMPARISON:  05/01/2015 FINDINGS: Lower chest: Clear lung bases. Normal heart size. No pericardial or pleural effusion. Thoracic aortic atherosclerosis noted. Hepatobiliary: Interval cholecystectomy. No biliary dilatation or obstruction. No large focal hepatic abnormality within the limits of noncontrast imaging. Stable calcifications along the left portal vein as before. Pancreas: Diffuse fatty replacement. No ductal dilatation or surrounding inflammatory process. No focal abnormality. Spleen: Normal in size without focal abnormality. Adrenals/Urinary Tract: Normal adrenal glands. Kidneys demonstrate similar numerous bilateral renal cysts, largest in the right kidney lower pole measures 6 cm. Punctate nonobstructing left kidney lower pole intrarenal calculus measures 5 mm, image 39 series 3. Renal hilar vascular calcifications noted. No acute obstructive uropathy, hydronephrosis, hydroureter, or obstructing ureteral calculus. Ureters are symmetric and decompressed. Bladder is unremarkable. Stomach/Bowel: Negative for bowel obstruction, significant dilatation, ileus, or free air. Normal appendix demonstrated. Colonic diverticulosis present without acute inflammation. No fluid collection or abscess. No focal wall thickening. Vascular/Lymphatic: Extensive native aortoiliac atherosclerosis. Patient is status post aortoiliac bypass. No adenopathy. Reproductive: Prostate gland is large calcifications. Seminal vesicles appear symmetric. Other: No abdominal wall hernia or abnormality. No abdominopelvic ascites. Musculoskeletal: Lower lumbar fusion hardware noted. No acute osseous finding. No severe compression fracture. IMPRESSION: No acute abdominal or pelvic finding by CT. Punctate nonobstructing left nephrolithiasis. No acute obstructive uropathy, hydronephrosis, or obstructing ureteral calculus on either side. Remote  cholecystectomy and aortoiliac bypass graft. Diverticulosis without acute inflammation Electronically Signed   By: Jerilynn Mages.  Shick M.D.   On: 11/13/2017 15:34    Procedures Procedures (including critical care time)  Medications Ordered in ED Medications  sodium chloride 0.9 % bolus 1,000 mL (0 mLs Intravenous Stopped 11/13/17 1904)     Initial Impression / Assessment and Plan / ED Course  I have reviewed the triage vital signs and the nursing notes.  Pertinent labs & imaging results that were available during my care of the patient were reviewed by me and considered in my medical decision making (see chart for details).     Given negative CT, pain is likely muscular. No radicular symptoms or incontinence or leg weakness. Doubt spinal pathology. Will try muscle relaxer. Unable to use nsaids. Advised heat and back exercises. Doubt acute intra-abd pathology. Bicarb low with mild anion gap but no clear cause. lactate normal.  Could be from renal disease but Cr near baseline. Given well appearance and benign vitals besides HTN, I don't think he is ill and is stable for outpatient workup. F/u with PCP for microscopic hematuria. Return precautions.  Final Clinical Impressions(s) / ED Diagnoses   Final diagnoses:  Abdominal wall pain in both lower quadrants  Acute bilateral low back pain without sciatica  Microscopic hematuria    ED Discharge Orders        Ordered    cyclobenzaprine (FLEXERIL) 5 MG tablet  3 times daily PRN     11/13/17 1845       Sherwood Gambler, MD 11/13/17 2303

## 2017-11-13 NOTE — ED Triage Notes (Signed)
Pt in c/o bil lower back pain onset x 2 wks, pt c/o worsening pain on L flank that radiates to bil lower abd, denies n/v/d, pts wife states, "His renal function has been decreasing. A&O x4

## 2017-11-13 NOTE — ED Provider Notes (Signed)
Patient placed in Quick Look pathway, seen and evaluated   Chief Complaint: left flank pain  HPI:   Presents with left flank pain. Started with lower back pain on the left 2 weeks ago. Wednesday night started to move to flank and abdomen. Denies urinary symptoms.  Normal bowel movement yesterday. Long drive Monday for a trip and drove back yesterday for 9 hours.  ROS: no fever chills nausea vomiting or diarrhea  Physical Exam:   Gen: No distress  Neuro: Awake and Alert  Skin: Warm    Focused Exam: firm, no ttp.  Afebrile, nontoxic-appearing.   Initiation of care has begun. The patient has been counseled on the process, plan, and necessity for staying for the completion/evaluation, and the remainder of the medical screening examination    [name] Der 11/13/17 1504    Tegeler, Gwenyth Allegra, MD 11/13/17 [phone]

## 2017-11-16 DIAGNOSIS — E1142 Type 2 diabetes mellitus with diabetic polyneuropathy: Secondary | ICD-10-CM | POA: Diagnosis not present

## 2017-11-16 DIAGNOSIS — E291 Testicular hypofunction: Secondary | ICD-10-CM | POA: Diagnosis not present

## 2017-11-16 DIAGNOSIS — I2510 Atherosclerotic heart disease of native coronary artery without angina pectoris: Secondary | ICD-10-CM | POA: Diagnosis not present

## 2017-11-16 DIAGNOSIS — N184 Chronic kidney disease, stage 4 (severe): Secondary | ICD-10-CM | POA: Diagnosis not present

## 2017-11-16 DIAGNOSIS — I739 Peripheral vascular disease, unspecified: Secondary | ICD-10-CM | POA: Diagnosis not present

## 2017-11-16 DIAGNOSIS — Z7984 Long term (current) use of oral hypoglycemic drugs: Secondary | ICD-10-CM | POA: Diagnosis not present

## 2017-11-17 DIAGNOSIS — M5412 Radiculopathy, cervical region: Secondary | ICD-10-CM | POA: Diagnosis not present

## 2017-11-17 DIAGNOSIS — M9902 Segmental and somatic dysfunction of thoracic region: Secondary | ICD-10-CM | POA: Diagnosis not present

## 2017-11-17 DIAGNOSIS — M5033 Other cervical disc degeneration, cervicothoracic region: Secondary | ICD-10-CM | POA: Diagnosis not present

## 2017-11-17 DIAGNOSIS — M9901 Segmental and somatic dysfunction of cervical region: Secondary | ICD-10-CM | POA: Diagnosis not present

## 2017-11-19 ENCOUNTER — Other Ambulatory Visit: Payer: Self-pay | Admitting: Interventional Cardiology

## 2017-11-19 DIAGNOSIS — I2510 Atherosclerotic heart disease of native coronary artery without angina pectoris: Secondary | ICD-10-CM

## 2017-11-19 DIAGNOSIS — I129 Hypertensive chronic kidney disease with stage 1 through stage 4 chronic kidney disease, or unspecified chronic kidney disease: Secondary | ICD-10-CM | POA: Diagnosis not present

## 2017-11-19 DIAGNOSIS — N183 Chronic kidney disease, stage 3 (moderate): Secondary | ICD-10-CM | POA: Diagnosis not present

## 2017-11-19 MED ORDER — METOPROLOL TARTRATE 50 MG PO TABS: 50.0000 mg | ORAL_TABLET | Freq: Two times a day (BID) | ORAL | 0 refills | Status: DC

## 2017-12-01 DIAGNOSIS — M9901 Segmental and somatic dysfunction of cervical region: Secondary | ICD-10-CM | POA: Diagnosis not present

## 2017-12-01 DIAGNOSIS — M5412 Radiculopathy, cervical region: Secondary | ICD-10-CM | POA: Diagnosis not present

## 2017-12-01 DIAGNOSIS — M9902 Segmental and somatic dysfunction of thoracic region: Secondary | ICD-10-CM | POA: Diagnosis not present

## 2017-12-01 DIAGNOSIS — M5033 Other cervical disc degeneration, cervicothoracic region: Secondary | ICD-10-CM | POA: Diagnosis not present

## 2017-12-07 DIAGNOSIS — N183 Chronic kidney disease, stage 3 (moderate): Secondary | ICD-10-CM | POA: Diagnosis not present

## 2017-12-14 DIAGNOSIS — M5412 Radiculopathy, cervical region: Secondary | ICD-10-CM | POA: Diagnosis not present

## 2017-12-14 DIAGNOSIS — M9901 Segmental and somatic dysfunction of cervical region: Secondary | ICD-10-CM | POA: Diagnosis not present

## 2017-12-14 DIAGNOSIS — M9902 Segmental and somatic dysfunction of thoracic region: Secondary | ICD-10-CM | POA: Diagnosis not present

## 2017-12-14 DIAGNOSIS — M5033 Other cervical disc degeneration, cervicothoracic region: Secondary | ICD-10-CM | POA: Diagnosis not present

## 2017-12-23 ENCOUNTER — Other Ambulatory Visit: Payer: Self-pay | Admitting: Interventional Cardiology

## 2017-12-23 DIAGNOSIS — I2510 Atherosclerotic heart disease of native coronary artery without angina pectoris: Secondary | ICD-10-CM

## 2017-12-24 DIAGNOSIS — N183 Chronic kidney disease, stage 3 (moderate): Secondary | ICD-10-CM | POA: Diagnosis not present

## 2017-12-25 DIAGNOSIS — E1142 Type 2 diabetes mellitus with diabetic polyneuropathy: Secondary | ICD-10-CM | POA: Diagnosis not present

## 2017-12-25 DIAGNOSIS — I679 Cerebrovascular disease, unspecified: Secondary | ICD-10-CM | POA: Diagnosis not present

## 2017-12-25 DIAGNOSIS — R42 Dizziness and giddiness: Secondary | ICD-10-CM | POA: Diagnosis not present

## 2017-12-25 DIAGNOSIS — Z794 Long term (current) use of insulin: Secondary | ICD-10-CM | POA: Diagnosis not present

## 2017-12-28 DIAGNOSIS — M5033 Other cervical disc degeneration, cervicothoracic region: Secondary | ICD-10-CM | POA: Diagnosis not present

## 2017-12-28 DIAGNOSIS — M9901 Segmental and somatic dysfunction of cervical region: Secondary | ICD-10-CM | POA: Diagnosis not present

## 2017-12-28 DIAGNOSIS — M5412 Radiculopathy, cervical region: Secondary | ICD-10-CM | POA: Diagnosis not present

## 2017-12-28 DIAGNOSIS — M9902 Segmental and somatic dysfunction of thoracic region: Secondary | ICD-10-CM | POA: Diagnosis not present

## 2017-12-31 DIAGNOSIS — N289 Disorder of kidney and ureter, unspecified: Secondary | ICD-10-CM | POA: Diagnosis not present

## 2017-12-31 DIAGNOSIS — E1165 Type 2 diabetes mellitus with hyperglycemia: Secondary | ICD-10-CM | POA: Diagnosis not present

## 2017-12-31 DIAGNOSIS — Z794 Long term (current) use of insulin: Secondary | ICD-10-CM | POA: Diagnosis not present

## 2017-12-31 DIAGNOSIS — R609 Edema, unspecified: Secondary | ICD-10-CM | POA: Diagnosis not present

## 2018-01-18 DIAGNOSIS — M9901 Segmental and somatic dysfunction of cervical region: Secondary | ICD-10-CM | POA: Diagnosis not present

## 2018-01-18 DIAGNOSIS — M9902 Segmental and somatic dysfunction of thoracic region: Secondary | ICD-10-CM | POA: Diagnosis not present

## 2018-01-18 DIAGNOSIS — M5033 Other cervical disc degeneration, cervicothoracic region: Secondary | ICD-10-CM | POA: Diagnosis not present

## 2018-01-18 DIAGNOSIS — M5412 Radiculopathy, cervical region: Secondary | ICD-10-CM | POA: Diagnosis not present

## 2018-01-20 DIAGNOSIS — Z08 Encounter for follow-up examination after completed treatment for malignant neoplasm: Secondary | ICD-10-CM | POA: Diagnosis not present

## 2018-01-20 DIAGNOSIS — D485 Neoplasm of uncertain behavior of skin: Secondary | ICD-10-CM | POA: Diagnosis not present

## 2018-01-20 DIAGNOSIS — Z85828 Personal history of other malignant neoplasm of skin: Secondary | ICD-10-CM | POA: Diagnosis not present

## 2018-01-20 DIAGNOSIS — L218 Other seborrheic dermatitis: Secondary | ICD-10-CM | POA: Diagnosis not present

## 2018-01-20 DIAGNOSIS — D0439 Carcinoma in situ of skin of other parts of face: Secondary | ICD-10-CM | POA: Diagnosis not present

## 2018-02-04 DIAGNOSIS — G4733 Obstructive sleep apnea (adult) (pediatric): Secondary | ICD-10-CM | POA: Diagnosis not present

## 2018-02-16 DIAGNOSIS — N289 Disorder of kidney and ureter, unspecified: Secondary | ICD-10-CM | POA: Diagnosis not present

## 2018-02-16 DIAGNOSIS — R609 Edema, unspecified: Secondary | ICD-10-CM | POA: Diagnosis not present

## 2018-02-16 DIAGNOSIS — E1165 Type 2 diabetes mellitus with hyperglycemia: Secondary | ICD-10-CM | POA: Diagnosis not present

## 2018-02-16 DIAGNOSIS — Z794 Long term (current) use of insulin: Secondary | ICD-10-CM | POA: Diagnosis not present

## 2018-02-25 DIAGNOSIS — Z79899 Other long term (current) drug therapy: Secondary | ICD-10-CM | POA: Diagnosis not present

## 2018-02-25 DIAGNOSIS — K219 Gastro-esophageal reflux disease without esophagitis: Secondary | ICD-10-CM | POA: Diagnosis not present

## 2018-02-25 DIAGNOSIS — G629 Polyneuropathy, unspecified: Secondary | ICD-10-CM | POA: Diagnosis not present

## 2018-02-25 DIAGNOSIS — I10 Essential (primary) hypertension: Secondary | ICD-10-CM | POA: Diagnosis not present

## 2018-02-25 DIAGNOSIS — E7800 Pure hypercholesterolemia, unspecified: Secondary | ICD-10-CM | POA: Diagnosis not present

## 2018-02-25 DIAGNOSIS — I679 Cerebrovascular disease, unspecified: Secondary | ICD-10-CM | POA: Diagnosis not present

## 2018-02-25 DIAGNOSIS — I2510 Atherosclerotic heart disease of native coronary artery without angina pectoris: Secondary | ICD-10-CM | POA: Diagnosis not present

## 2018-02-25 DIAGNOSIS — C4490 Unspecified malignant neoplasm of skin, unspecified: Secondary | ICD-10-CM | POA: Diagnosis not present

## 2018-02-25 DIAGNOSIS — R609 Edema, unspecified: Secondary | ICD-10-CM | POA: Diagnosis not present

## 2018-02-26 DIAGNOSIS — E1165 Type 2 diabetes mellitus with hyperglycemia: Secondary | ICD-10-CM | POA: Diagnosis not present

## 2018-03-01 DIAGNOSIS — M9901 Segmental and somatic dysfunction of cervical region: Secondary | ICD-10-CM | POA: Diagnosis not present

## 2018-03-01 DIAGNOSIS — M9902 Segmental and somatic dysfunction of thoracic region: Secondary | ICD-10-CM | POA: Diagnosis not present

## 2018-03-01 DIAGNOSIS — M5033 Other cervical disc degeneration, cervicothoracic region: Secondary | ICD-10-CM | POA: Diagnosis not present

## 2018-03-01 DIAGNOSIS — M5412 Radiculopathy, cervical region: Secondary | ICD-10-CM | POA: Diagnosis not present

## 2018-03-04 DIAGNOSIS — D485 Neoplasm of uncertain behavior of skin: Secondary | ICD-10-CM | POA: Diagnosis not present

## 2018-03-04 DIAGNOSIS — C44622 Squamous cell carcinoma of skin of right upper limb, including shoulder: Secondary | ICD-10-CM | POA: Diagnosis not present

## 2018-03-22 ENCOUNTER — Other Ambulatory Visit: Payer: Self-pay

## 2018-03-22 DIAGNOSIS — I2510 Atherosclerotic heart disease of native coronary artery without angina pectoris: Secondary | ICD-10-CM

## 2018-03-22 MED ORDER — METOPROLOL TARTRATE 50 MG PO TABS: 50.0000 mg | ORAL_TABLET | Freq: Two times a day (BID) | ORAL | 0 refills | Status: DC

## 2018-03-29 DIAGNOSIS — M9901 Segmental and somatic dysfunction of cervical region: Secondary | ICD-10-CM | POA: Diagnosis not present

## 2018-03-29 DIAGNOSIS — M5033 Other cervical disc degeneration, cervicothoracic region: Secondary | ICD-10-CM | POA: Diagnosis not present

## 2018-03-29 DIAGNOSIS — M9902 Segmental and somatic dysfunction of thoracic region: Secondary | ICD-10-CM | POA: Diagnosis not present

## 2018-03-29 DIAGNOSIS — M5412 Radiculopathy, cervical region: Secondary | ICD-10-CM | POA: Diagnosis not present

## 2018-03-31 DIAGNOSIS — E1165 Type 2 diabetes mellitus with hyperglycemia: Secondary | ICD-10-CM | POA: Diagnosis not present

## 2018-04-08 DIAGNOSIS — R809 Proteinuria, unspecified: Secondary | ICD-10-CM | POA: Diagnosis not present

## 2018-04-08 DIAGNOSIS — G4733 Obstructive sleep apnea (adult) (pediatric): Secondary | ICD-10-CM | POA: Diagnosis not present

## 2018-04-08 DIAGNOSIS — N179 Acute kidney failure, unspecified: Secondary | ICD-10-CM | POA: Diagnosis not present

## 2018-04-08 DIAGNOSIS — E871 Hypo-osmolality and hyponatremia: Secondary | ICD-10-CM | POA: Diagnosis not present

## 2018-04-08 DIAGNOSIS — I129 Hypertensive chronic kidney disease with stage 1 through stage 4 chronic kidney disease, or unspecified chronic kidney disease: Secondary | ICD-10-CM | POA: Diagnosis not present

## 2018-04-08 DIAGNOSIS — E785 Hyperlipidemia, unspecified: Secondary | ICD-10-CM | POA: Diagnosis not present

## 2018-04-08 DIAGNOSIS — N183 Chronic kidney disease, stage 3 (moderate): Secondary | ICD-10-CM | POA: Diagnosis not present

## 2018-04-08 DIAGNOSIS — I2510 Atherosclerotic heart disease of native coronary artery without angina pectoris: Secondary | ICD-10-CM | POA: Diagnosis not present

## 2018-04-08 DIAGNOSIS — I639 Cerebral infarction, unspecified: Secondary | ICD-10-CM | POA: Diagnosis not present

## 2018-04-08 DIAGNOSIS — E1122 Type 2 diabetes mellitus with diabetic chronic kidney disease: Secondary | ICD-10-CM | POA: Diagnosis not present

## 2018-04-09 DIAGNOSIS — E119 Type 2 diabetes mellitus without complications: Secondary | ICD-10-CM | POA: Diagnosis not present

## 2018-04-09 DIAGNOSIS — H02889 Meibomian gland dysfunction of unspecified eye, unspecified eyelid: Secondary | ICD-10-CM | POA: Diagnosis not present

## 2018-04-09 DIAGNOSIS — H401131 Primary open-angle glaucoma, bilateral, mild stage: Secondary | ICD-10-CM | POA: Diagnosis not present

## 2018-04-09 DIAGNOSIS — H2513 Age-related nuclear cataract, bilateral: Secondary | ICD-10-CM | POA: Diagnosis not present

## 2018-04-21 DIAGNOSIS — C44622 Squamous cell carcinoma of skin of right upper limb, including shoulder: Secondary | ICD-10-CM | POA: Diagnosis not present

## 2018-04-22 DIAGNOSIS — E291 Testicular hypofunction: Secondary | ICD-10-CM | POA: Diagnosis not present

## 2018-04-22 DIAGNOSIS — Z79899 Other long term (current) drug therapy: Secondary | ICD-10-CM | POA: Diagnosis not present

## 2018-04-22 DIAGNOSIS — E7800 Pure hypercholesterolemia, unspecified: Secondary | ICD-10-CM | POA: Diagnosis not present

## 2018-04-22 DIAGNOSIS — R05 Cough: Secondary | ICD-10-CM | POA: Diagnosis not present

## 2018-04-22 DIAGNOSIS — Z125 Encounter for screening for malignant neoplasm of prostate: Secondary | ICD-10-CM | POA: Diagnosis not present

## 2018-04-26 DIAGNOSIS — M9902 Segmental and somatic dysfunction of thoracic region: Secondary | ICD-10-CM | POA: Diagnosis not present

## 2018-04-26 DIAGNOSIS — M5412 Radiculopathy, cervical region: Secondary | ICD-10-CM | POA: Diagnosis not present

## 2018-04-26 DIAGNOSIS — M9901 Segmental and somatic dysfunction of cervical region: Secondary | ICD-10-CM | POA: Diagnosis not present

## 2018-04-26 DIAGNOSIS — M5033 Other cervical disc degeneration, cervicothoracic region: Secondary | ICD-10-CM | POA: Diagnosis not present

## 2018-04-27 DIAGNOSIS — N184 Chronic kidney disease, stage 4 (severe): Secondary | ICD-10-CM | POA: Diagnosis not present

## 2018-04-27 DIAGNOSIS — K219 Gastro-esophageal reflux disease without esophagitis: Secondary | ICD-10-CM | POA: Diagnosis not present

## 2018-04-27 DIAGNOSIS — E291 Testicular hypofunction: Secondary | ICD-10-CM | POA: Diagnosis not present

## 2018-04-27 DIAGNOSIS — I739 Peripheral vascular disease, unspecified: Secondary | ICD-10-CM | POA: Diagnosis not present

## 2018-04-27 DIAGNOSIS — Z23 Encounter for immunization: Secondary | ICD-10-CM | POA: Diagnosis not present

## 2018-04-27 DIAGNOSIS — E7800 Pure hypercholesterolemia, unspecified: Secondary | ICD-10-CM | POA: Diagnosis not present

## 2018-04-27 DIAGNOSIS — Z0000 Encounter for general adult medical examination without abnormal findings: Secondary | ICD-10-CM | POA: Diagnosis not present

## 2018-04-27 DIAGNOSIS — I679 Cerebrovascular disease, unspecified: Secondary | ICD-10-CM | POA: Diagnosis not present

## 2018-04-27 DIAGNOSIS — G629 Polyneuropathy, unspecified: Secondary | ICD-10-CM | POA: Diagnosis not present

## 2018-04-27 DIAGNOSIS — I10 Essential (primary) hypertension: Secondary | ICD-10-CM | POA: Diagnosis not present

## 2018-04-29 DIAGNOSIS — I129 Hypertensive chronic kidney disease with stage 1 through stage 4 chronic kidney disease, or unspecified chronic kidney disease: Secondary | ICD-10-CM | POA: Diagnosis not present

## 2018-04-29 DIAGNOSIS — N183 Chronic kidney disease, stage 3 (moderate): Secondary | ICD-10-CM | POA: Diagnosis not present

## 2018-05-05 DIAGNOSIS — E1165 Type 2 diabetes mellitus with hyperglycemia: Secondary | ICD-10-CM | POA: Diagnosis not present

## 2018-05-05 DIAGNOSIS — T8140XA Infection following a procedure, unspecified, initial encounter: Secondary | ICD-10-CM | POA: Diagnosis not present

## 2018-05-13 DIAGNOSIS — M5412 Radiculopathy, cervical region: Secondary | ICD-10-CM | POA: Diagnosis not present

## 2018-05-13 DIAGNOSIS — M9901 Segmental and somatic dysfunction of cervical region: Secondary | ICD-10-CM | POA: Diagnosis not present

## 2018-05-13 DIAGNOSIS — M5033 Other cervical disc degeneration, cervicothoracic region: Secondary | ICD-10-CM | POA: Diagnosis not present

## 2018-05-13 DIAGNOSIS — M9902 Segmental and somatic dysfunction of thoracic region: Secondary | ICD-10-CM | POA: Diagnosis not present

## 2018-05-15 IMAGING — US US ABDOMEN COMPLETE
1 series · 14 of 25 positions shown · non-contrast
Comparison: 05/09/2015 abdominal CT

CLINICAL DATA: Right upper quadrant pain and nausea.

EXAM:
ABDOMEN ULTRASOUND COMPLETE

[Series 1: us abdomen complete · 0.30mm/px · 14 of 84 slices shown]
[im 1/84]
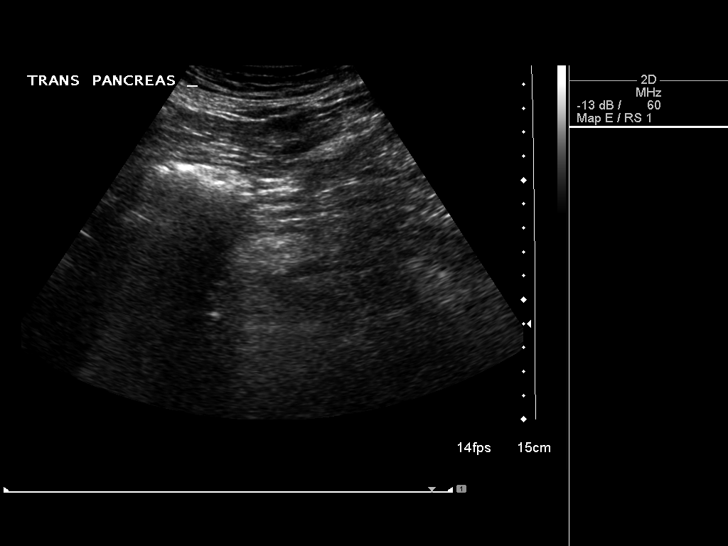
[im 7/84]
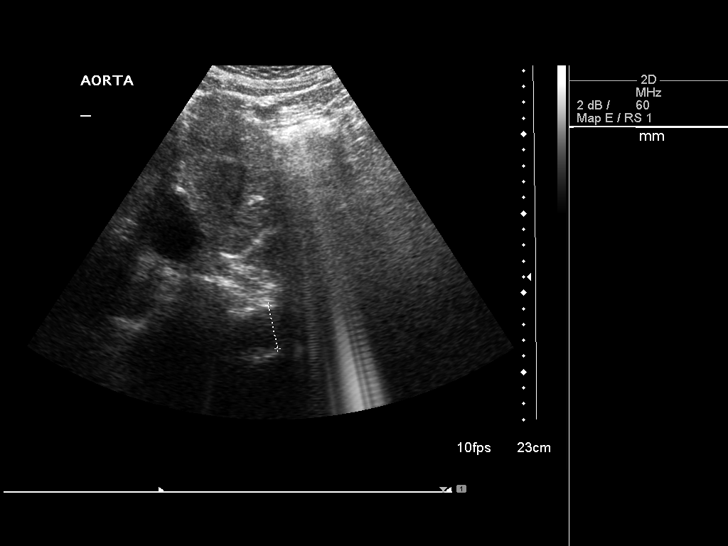
[im 14/84]
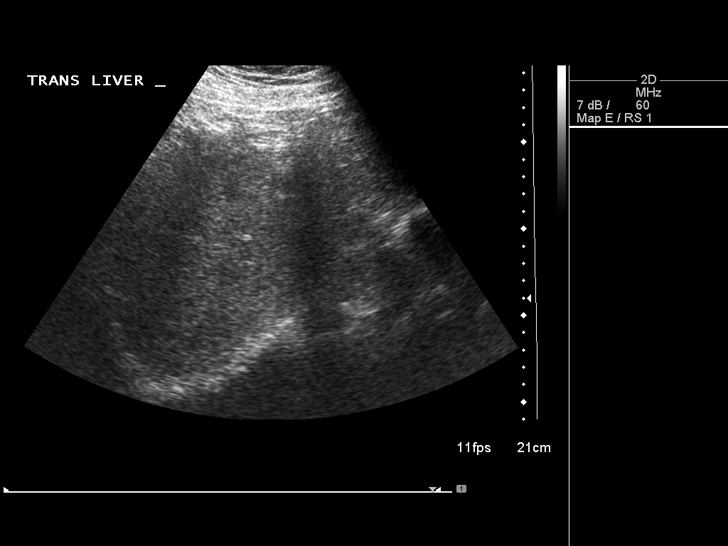
[im 21/84]
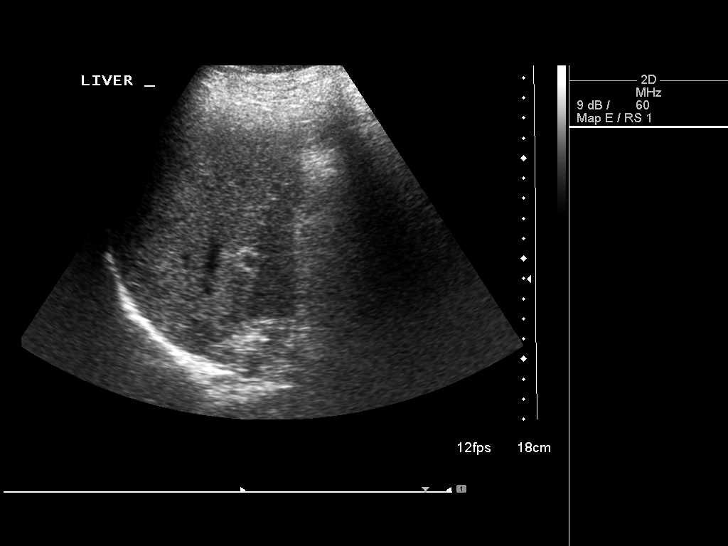
[im 28/84]
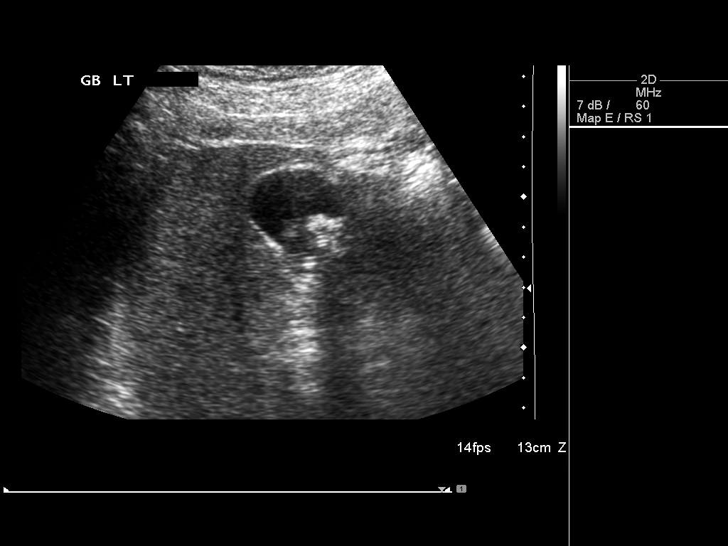
[im 32/84]
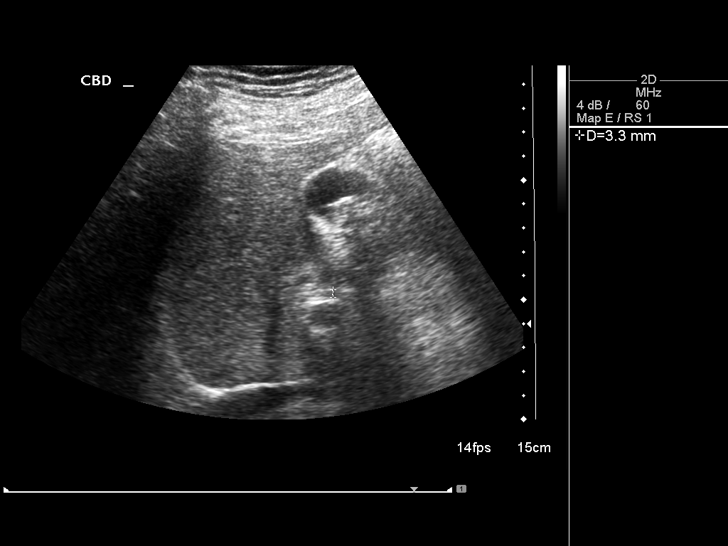
[im 39/84]
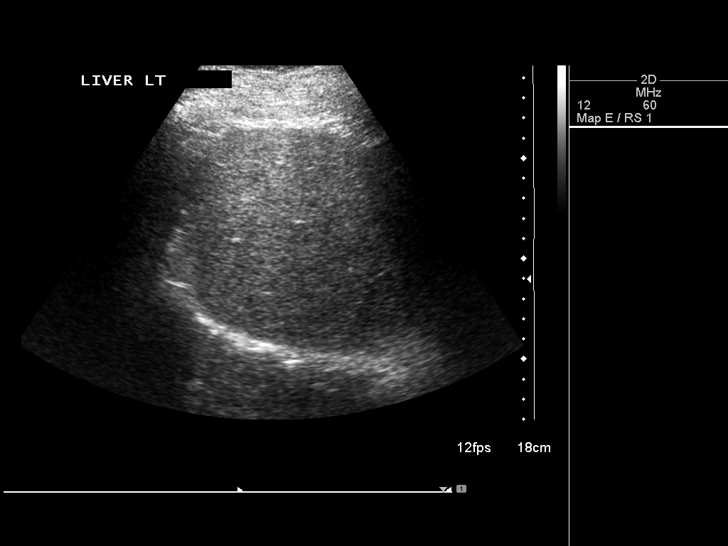
[im 45/84]
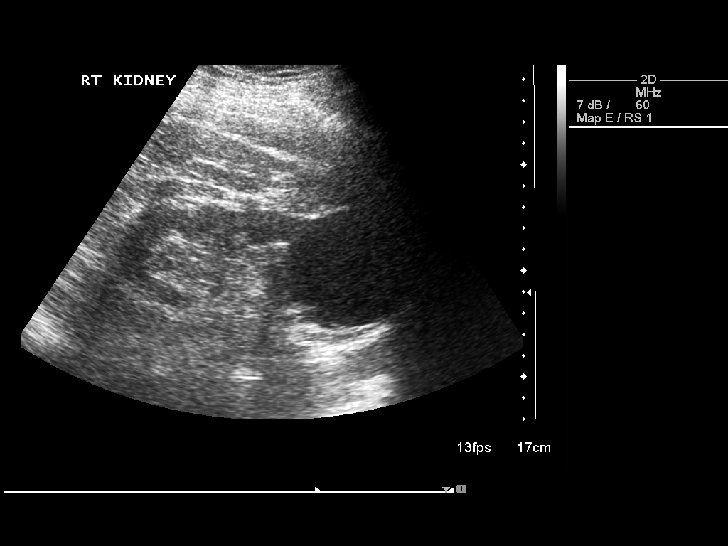
[im 52/84]
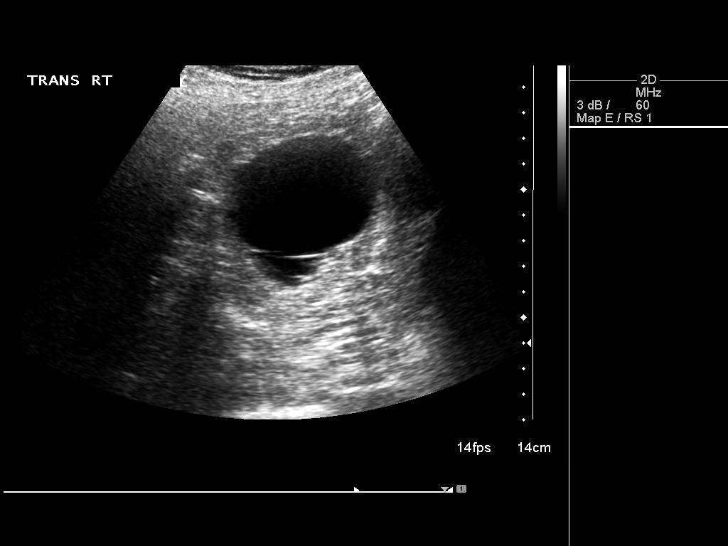
[im 56/84]
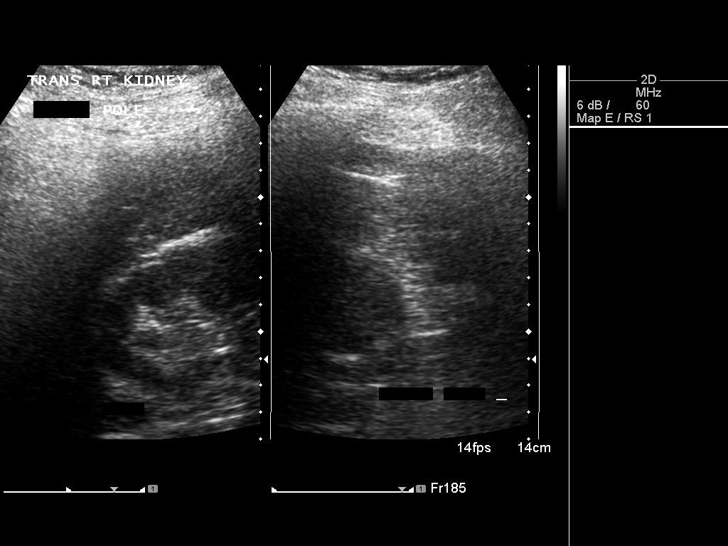
[im 63/84]
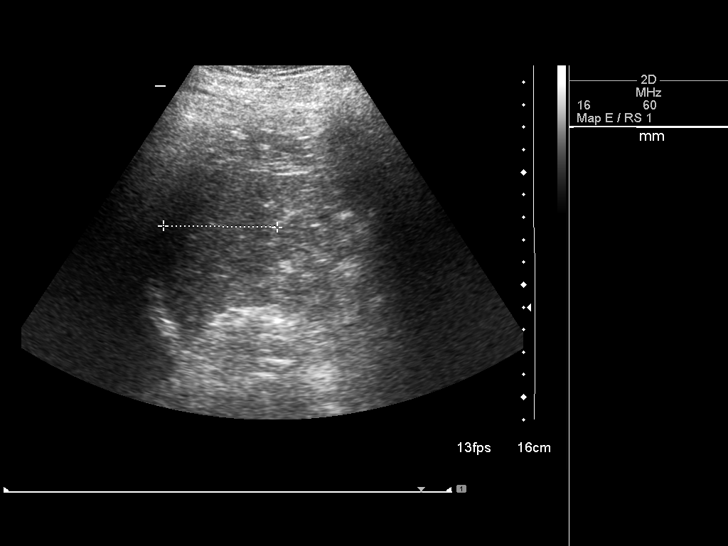
[im 70/84]
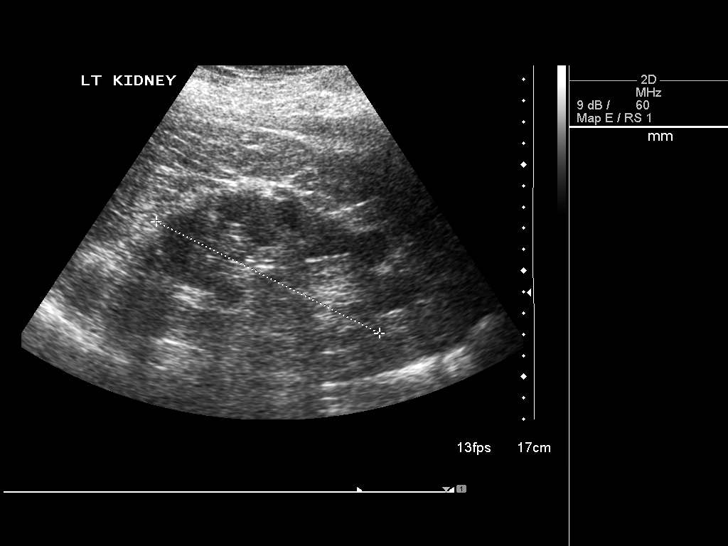
[im 77/84]
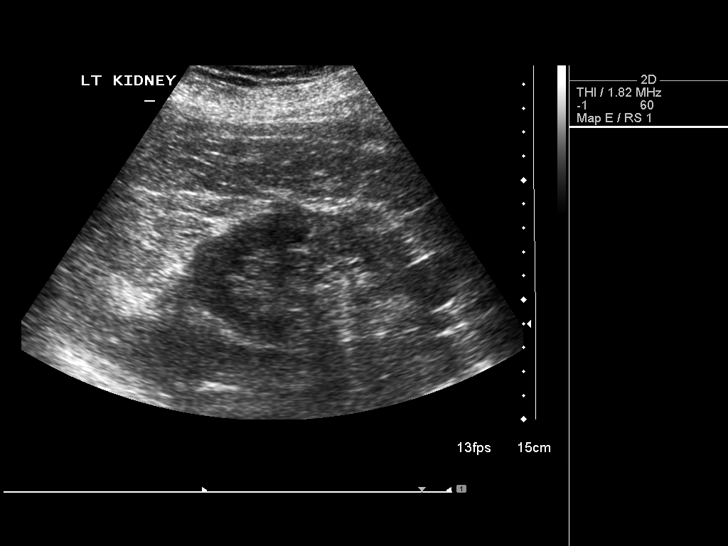
[im 84/84]
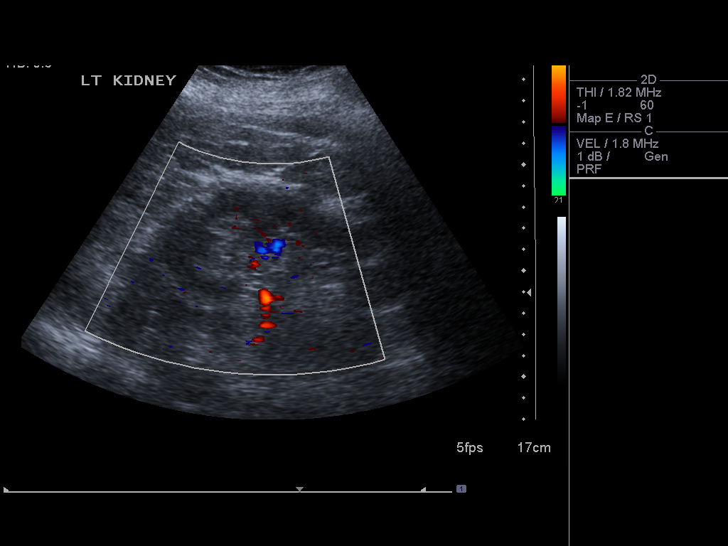

[14 of 25 positions shown; findings below may reference images not displayed]

FINDINGS: Gallbladder: Cholelithiasis. No wall thickening or focal tenderness
to suggest acute cholecystitis.

Common bile duct: Diameter: 3 mm

Liver: No focal lesion identified. Within normal limits in
parenchymal echogenicity.

IVC: Obscured by bowel gas

Pancreas: Very limited evaluation.  No detected abnormality.

Spleen: Size and appearance within normal limits.

Right Kidney: Length: 12 cm. Lower pole cyst measuring up to 6 cm.
There is a thin benign-appearing septation inferiorly which was also
seen on 4340 CT. No hydronephrosis or solid mass.

Left Kidney: Length: 12 cm.. Echogenicity within normal limits. No
solid mass or hydronephrosis visualized. Cluster of small simple
appearing cysts in the lower pole.

Abdominal aorta: Mostly obscured by bowel gas.  No aneurysm noted.

Other findings: None.
IMPRESSION: 1. No acute finding.
2. Cholelithiasis.
3. Bilateral renal cysts.
4. Limited evaluation of the pancreas due to bowel gas.

## 2018-06-04 DIAGNOSIS — E1165 Type 2 diabetes mellitus with hyperglycemia: Secondary | ICD-10-CM | POA: Diagnosis not present

## 2018-06-14 DIAGNOSIS — M5033 Other cervical disc degeneration, cervicothoracic region: Secondary | ICD-10-CM | POA: Diagnosis not present

## 2018-06-14 DIAGNOSIS — E1165 Type 2 diabetes mellitus with hyperglycemia: Secondary | ICD-10-CM | POA: Diagnosis not present

## 2018-06-14 DIAGNOSIS — R609 Edema, unspecified: Secondary | ICD-10-CM | POA: Diagnosis not present

## 2018-06-14 DIAGNOSIS — N289 Disorder of kidney and ureter, unspecified: Secondary | ICD-10-CM | POA: Diagnosis not present

## 2018-06-14 DIAGNOSIS — M5412 Radiculopathy, cervical region: Secondary | ICD-10-CM | POA: Diagnosis not present

## 2018-06-14 DIAGNOSIS — M9901 Segmental and somatic dysfunction of cervical region: Secondary | ICD-10-CM | POA: Diagnosis not present

## 2018-06-14 DIAGNOSIS — Z794 Long term (current) use of insulin: Secondary | ICD-10-CM | POA: Diagnosis not present

## 2018-06-14 DIAGNOSIS — M9902 Segmental and somatic dysfunction of thoracic region: Secondary | ICD-10-CM | POA: Diagnosis not present

## 2018-06-21 DIAGNOSIS — M5033 Other cervical disc degeneration, cervicothoracic region: Secondary | ICD-10-CM | POA: Diagnosis not present

## 2018-06-21 DIAGNOSIS — M9901 Segmental and somatic dysfunction of cervical region: Secondary | ICD-10-CM | POA: Diagnosis not present

## 2018-06-21 DIAGNOSIS — M9902 Segmental and somatic dysfunction of thoracic region: Secondary | ICD-10-CM | POA: Diagnosis not present

## 2018-06-21 DIAGNOSIS — M5412 Radiculopathy, cervical region: Secondary | ICD-10-CM | POA: Diagnosis not present

## 2018-06-28 DIAGNOSIS — M5412 Radiculopathy, cervical region: Secondary | ICD-10-CM | POA: Diagnosis not present

## 2018-06-28 DIAGNOSIS — M9901 Segmental and somatic dysfunction of cervical region: Secondary | ICD-10-CM | POA: Diagnosis not present

## 2018-06-28 DIAGNOSIS — M9902 Segmental and somatic dysfunction of thoracic region: Secondary | ICD-10-CM | POA: Diagnosis not present

## 2018-06-28 DIAGNOSIS — M5033 Other cervical disc degeneration, cervicothoracic region: Secondary | ICD-10-CM | POA: Diagnosis not present

## 2018-07-05 DIAGNOSIS — N183 Chronic kidney disease, stage 3 (moderate): Secondary | ICD-10-CM | POA: Diagnosis not present

## 2018-07-05 DIAGNOSIS — I129 Hypertensive chronic kidney disease with stage 1 through stage 4 chronic kidney disease, or unspecified chronic kidney disease: Secondary | ICD-10-CM | POA: Diagnosis not present

## 2018-07-09 DIAGNOSIS — E1165 Type 2 diabetes mellitus with hyperglycemia: Secondary | ICD-10-CM | POA: Diagnosis not present

## 2018-07-26 DIAGNOSIS — M5033 Other cervical disc degeneration, cervicothoracic region: Secondary | ICD-10-CM | POA: Diagnosis not present

## 2018-07-26 DIAGNOSIS — M5412 Radiculopathy, cervical region: Secondary | ICD-10-CM | POA: Diagnosis not present

## 2018-07-26 DIAGNOSIS — M9902 Segmental and somatic dysfunction of thoracic region: Secondary | ICD-10-CM | POA: Diagnosis not present

## 2018-07-26 DIAGNOSIS — M9901 Segmental and somatic dysfunction of cervical region: Secondary | ICD-10-CM | POA: Diagnosis not present

## 2018-07-28 DIAGNOSIS — C44629 Squamous cell carcinoma of skin of left upper limb, including shoulder: Secondary | ICD-10-CM | POA: Diagnosis not present

## 2018-07-28 DIAGNOSIS — X32XXXA Exposure to sunlight, initial encounter: Secondary | ICD-10-CM | POA: Diagnosis not present

## 2018-07-28 DIAGNOSIS — Z08 Encounter for follow-up examination after completed treatment for malignant neoplasm: Secondary | ICD-10-CM | POA: Diagnosis not present

## 2018-07-28 DIAGNOSIS — Z85828 Personal history of other malignant neoplasm of skin: Secondary | ICD-10-CM | POA: Diagnosis not present

## 2018-07-28 DIAGNOSIS — L821 Other seborrheic keratosis: Secondary | ICD-10-CM | POA: Diagnosis not present

## 2018-07-28 DIAGNOSIS — L739 Follicular disorder, unspecified: Secondary | ICD-10-CM | POA: Diagnosis not present

## 2018-07-28 DIAGNOSIS — L570 Actinic keratosis: Secondary | ICD-10-CM | POA: Diagnosis not present

## 2018-07-28 DIAGNOSIS — D485 Neoplasm of uncertain behavior of skin: Secondary | ICD-10-CM | POA: Diagnosis not present

## 2018-08-09 DIAGNOSIS — E1165 Type 2 diabetes mellitus with hyperglycemia: Secondary | ICD-10-CM | POA: Diagnosis not present

## 2018-08-10 DIAGNOSIS — J40 Bronchitis, not specified as acute or chronic: Secondary | ICD-10-CM | POA: Diagnosis not present

## 2018-08-23 DIAGNOSIS — M9901 Segmental and somatic dysfunction of cervical region: Secondary | ICD-10-CM | POA: Diagnosis not present

## 2018-08-23 DIAGNOSIS — M9902 Segmental and somatic dysfunction of thoracic region: Secondary | ICD-10-CM | POA: Diagnosis not present

## 2018-08-23 DIAGNOSIS — M5033 Other cervical disc degeneration, cervicothoracic region: Secondary | ICD-10-CM | POA: Diagnosis not present

## 2018-08-23 DIAGNOSIS — M5412 Radiculopathy, cervical region: Secondary | ICD-10-CM | POA: Diagnosis not present

## 2018-08-30 DIAGNOSIS — H25011 Cortical age-related cataract, right eye: Secondary | ICD-10-CM | POA: Diagnosis not present

## 2018-08-30 DIAGNOSIS — H25013 Cortical age-related cataract, bilateral: Secondary | ICD-10-CM | POA: Diagnosis not present

## 2018-08-30 DIAGNOSIS — H40013 Open angle with borderline findings, low risk, bilateral: Secondary | ICD-10-CM | POA: Diagnosis not present

## 2018-08-30 DIAGNOSIS — E113291 Type 2 diabetes mellitus with mild nonproliferative diabetic retinopathy without macular edema, right eye: Secondary | ICD-10-CM | POA: Diagnosis not present

## 2018-08-30 DIAGNOSIS — H2513 Age-related nuclear cataract, bilateral: Secondary | ICD-10-CM | POA: Diagnosis not present

## 2018-08-30 DIAGNOSIS — H2511 Age-related nuclear cataract, right eye: Secondary | ICD-10-CM | POA: Diagnosis not present

## 2018-09-01 DIAGNOSIS — L570 Actinic keratosis: Secondary | ICD-10-CM | POA: Diagnosis not present

## 2018-09-01 DIAGNOSIS — C44629 Squamous cell carcinoma of skin of left upper limb, including shoulder: Secondary | ICD-10-CM | POA: Diagnosis not present

## 2018-09-09 DIAGNOSIS — I639 Cerebral infarction, unspecified: Secondary | ICD-10-CM | POA: Diagnosis not present

## 2018-09-09 DIAGNOSIS — E785 Hyperlipidemia, unspecified: Secondary | ICD-10-CM | POA: Diagnosis not present

## 2018-09-09 DIAGNOSIS — R809 Proteinuria, unspecified: Secondary | ICD-10-CM | POA: Diagnosis not present

## 2018-09-09 DIAGNOSIS — N183 Chronic kidney disease, stage 3 (moderate): Secondary | ICD-10-CM | POA: Diagnosis not present

## 2018-09-09 DIAGNOSIS — I2510 Atherosclerotic heart disease of native coronary artery without angina pectoris: Secondary | ICD-10-CM | POA: Diagnosis not present

## 2018-09-09 DIAGNOSIS — E1122 Type 2 diabetes mellitus with diabetic chronic kidney disease: Secondary | ICD-10-CM | POA: Diagnosis not present

## 2018-09-09 DIAGNOSIS — G4733 Obstructive sleep apnea (adult) (pediatric): Secondary | ICD-10-CM | POA: Diagnosis not present

## 2018-09-09 DIAGNOSIS — N179 Acute kidney failure, unspecified: Secondary | ICD-10-CM | POA: Diagnosis not present

## 2018-09-09 DIAGNOSIS — E871 Hypo-osmolality and hyponatremia: Secondary | ICD-10-CM | POA: Diagnosis not present

## 2018-09-09 DIAGNOSIS — I129 Hypertensive chronic kidney disease with stage 1 through stage 4 chronic kidney disease, or unspecified chronic kidney disease: Secondary | ICD-10-CM | POA: Diagnosis not present

## 2018-09-13 DIAGNOSIS — E1165 Type 2 diabetes mellitus with hyperglycemia: Secondary | ICD-10-CM | POA: Diagnosis not present

## 2018-09-14 DIAGNOSIS — H25811 Combined forms of age-related cataract, right eye: Secondary | ICD-10-CM | POA: Diagnosis not present

## 2018-09-14 DIAGNOSIS — H2511 Age-related nuclear cataract, right eye: Secondary | ICD-10-CM | POA: Diagnosis not present

## 2018-09-22 DIAGNOSIS — M9902 Segmental and somatic dysfunction of thoracic region: Secondary | ICD-10-CM | POA: Diagnosis not present

## 2018-09-22 DIAGNOSIS — M9901 Segmental and somatic dysfunction of cervical region: Secondary | ICD-10-CM | POA: Diagnosis not present

## 2018-09-22 DIAGNOSIS — M5033 Other cervical disc degeneration, cervicothoracic region: Secondary | ICD-10-CM | POA: Diagnosis not present

## 2018-09-22 DIAGNOSIS — M5412 Radiculopathy, cervical region: Secondary | ICD-10-CM | POA: Diagnosis not present

## 2018-09-23 DIAGNOSIS — H2511 Age-related nuclear cataract, right eye: Secondary | ICD-10-CM | POA: Diagnosis not present

## 2018-09-27 DIAGNOSIS — R609 Edema, unspecified: Secondary | ICD-10-CM | POA: Diagnosis not present

## 2018-09-27 DIAGNOSIS — E1165 Type 2 diabetes mellitus with hyperglycemia: Secondary | ICD-10-CM | POA: Diagnosis not present

## 2018-09-27 DIAGNOSIS — Z794 Long term (current) use of insulin: Secondary | ICD-10-CM | POA: Diagnosis not present

## 2018-09-27 DIAGNOSIS — N289 Disorder of kidney and ureter, unspecified: Secondary | ICD-10-CM | POA: Diagnosis not present

## 2018-09-28 ENCOUNTER — Other Ambulatory Visit: Payer: Self-pay

## 2018-09-28 DIAGNOSIS — Z9889 Other specified postprocedural states: Secondary | ICD-10-CM

## 2018-09-28 DIAGNOSIS — I779 Disorder of arteries and arterioles, unspecified: Secondary | ICD-10-CM

## 2018-09-29 NOTE — Progress Notes (Signed)
HISTORY AND PHYSICAL     CC:  follow up. Requesting Provider:  [name], MD  HPI: This is a 73 y.o. male who is here today for follow up.  He has hx of aortobifemoral bypass grafitng for arterial occlusive dz in 1998 by Dr. Donnetta Hutching.  He also has had a left CEA in 1994 and redo CEA in 2001 also by Dr. Donnetta Hutching.    At his last visit, he reported his calves would feel tired after 30 minutes of walking and relieved with rest.  He did not have any wounds.  He has hx of stroke at age 52-last incident was prior to his 2001 redo CEA.  He has hx of CABG in 2016.  He has hx of CKD III.    The pt returns today for follow up.  He states that his walking is about the same.  He can walk for about 15 minutes before he starts to have cramps in his legs.  His left leg is worse than his right.  He states that he just walks through the pain.  He denies any non healing wounds or rest pain.  He does have diabetic neuropathy and takes gabapentin.  He states that when he takes this, he has trouble with his words for instance, if he means hot he might say cold.  He states he will stop it for a couple of days and this resolves.  He denies any amaurosis fugax or hemiparesis.  He has a little facial droop that is not new.  He has had cataract surgery recently.   The pt is on a statin for cholesterol management.    The pt does have diabetes. The pt is on BB and ARB for hypertension.  The pt is on an aspirin.  Tobacco hx:  remote Other AC:  Plavix  Past Medical History:  Diagnosis Date  . Anemia   . Arthritis   . Carotid artery occlusion    a. Carotid US 10/16: RICA 60-79%, L CEA patent with 1-39% stenosis  . Coronary artery disease    a. Myoview 9/16:  EF 52%, inferior fixed defect consistent with diaphragmatic attenuation, intermediate risk secondary to poor exercise tolerance and symptoms during stress;  b. LHC 05/17/2015 90% mid LAD, 80% ost D2, 75% mid RCA, 35% prox RCA. >> S/p CABG  . Diabetes mellitus  age 71  . Diverticulitis   . GERD (gastroesophageal reflux disease)   . H/O hiatal hernia   . History of echocardiogram    a. Echo 9/16: GLS -15.2%, EF 33-29%, grade 1 diastolic dysfunction, normal wall motion, aortic sclerosis, dilated aortic root 39 mm, atrial septal lipomatous hypertrophy  . Hyperlipidemia   . Hypertension   . Irritable bowel syndrome (IBS)   . Myocardial infarction [facility])    silent inferior MI; patient denies MI history (03/17/13)   . Neuropathy 2013  . Osteoporosis 2013  . PAF (paroxysmal atrial fibrillation) ([place])    post CABG; Amiodarone stopped 2/2 wheezing;   . Peripheral vascular disease ([place])   . Reflux esophagitis   . Sleep apnea    uses CPAP  . Stroke [facility]) 1987   Right brain stroke  . Wears glasses     Past Surgical History:  Procedure Laterality Date  . CARDIAC CATHETERIZATION N/A 05/17/2015   Procedure: Left Heart Cath and Coronary Angiography;  Surgeon: Belva Crome, MD;  Location: [place] [facility];  Service: Cardiovascular;  Laterality: N/A;  . CAROTID ENDARTERECTOMY  1994 &  redo 2001   Left  . COLONOSCOPY W/ BIOPSIES AND POLYPECTOMY    . CORONARY ARTERY BYPASS GRAFT N/A 05/23/2015   Procedure: CORONARY ARTERY BYPASS GRAFTING (CABG) x 3 (LIMA to LAD, SVG to DIAGONAL 2, SVG to PDA) with Endoscopic Vein Harvesting from right greater saphenous vein;  Surgeon: Grace Isaac, MD;  Location: [place];  Service: Open Heart Surgery;  Laterality: N/A;  . FRACTURE SURGERY  2013   Right   foo  t X's 2  . ILIAC ARTERY STENT    . LAPAROSCOPIC CHOLECYSTECTOMY  09/11/2016  . POSTERIOR LUMBAR FUSION  Aug. 14, 2014   Level 1  . PR VEIN BYPASS GRAFT,AORTO-FEM-POP  1998  . [place] SURGERY  2014  . TEE WITHOUT CARDIOVERSION N/A 05/23/2015   Procedure: TRANSESOPHAGEAL ECHOCARDIOGRAM (TEE);  Surgeon: Grace Isaac, MD;  Location: [place];  Service: Open Heart Surgery;  Laterality: N/A;  . TONSILLECTOMY    . TRIGGER FINGER RELEASE Right 07/14/2017   Procedure:  RIGHT LONG FINGER TRIGGER RELEASE;  Surgeon: Milly Jakob, MD;  Location: [place];  Service: Orthopedics;  Laterality: Right;    Allergies  Allergen Reactions  . Hydrocodone Nausea Only  . Oxycodone Nausea Only  . Penicillins Rash    Has patient had a PCN reaction causing immediate rash, facial/tongue/throat swelling, SOB or lightheadedness with hypotension: NO Has patient had a PCN reaction causing severe rash involving mucus membranes or skin necrosis:NO Has patient had a PCN reaction that required hospitalization NO Has patient had a PCN reaction occurring within the last 10 years: NO If all of the above answers are "NO", then may proceed with Cephalosporin use.   . Sulfa Drugs Cross Reactors Rash    Current Outpatient Medications  Medication Sig Dispense Refill  . acetaminophen (TYLENOL) 325 MG tablet Take 2 tablets (650 mg total) by mouth every 6 (six) hours as needed for mild pain or moderate pain.    Marland Kitchen aspirin EC 81 MG EC tablet Take 1 tablet (81 mg total) by mouth daily.    Marland Kitchen atorvastatin (LIPITOR) 40 MG tablet Take 40 mg by mouth daily.      . clopidogrel (PLAVIX) 75 MG tablet Take 1 tablet (75 mg total) by mouth daily. 1 tablet 0  . cyclobenzaprine (FLEXERIL) 5 MG tablet Take 1 tablet (5 mg total) by mouth 3 (three) times daily as needed for muscle spasms. 15 tablet 0  . dexlansoprazole (DEXILANT) 60 MG capsule Take 60 mg by mouth every other day.     . erythromycin ophthalmic ointment Place 1 application into the left eye at bedtime. APPLY IN THE CORNER    . gabapentin (NEURONTIN) 100 MG capsule Take 200 mg by mouth at bedtime.    Marland Kitchen glimepiride (AMARYL) 2 MG tablet Take 2 mg by mouth daily before breakfast.      . hydrocortisone (ANUSOL-HC) 2.5 % rectal cream Place 1 application rectally 2 (two) times daily as needed for hemorrhoids or anal itching.    Marland Kitchen JARDIANCE 25 MG TABS tablet Take 25 mg by mouth daily.    Marland Kitchen L-Methylfolate-Algae-B12-B6 (FOLTANX RF  PO) Take 1 tablet by mouth daily.     Marland Kitchen losartan-hydrochlorothiazide (HYZAAR) 50-12.5 MG tablet TAKE 1 TABLET BY MOUTH EVERY DAY 90 tablet 2  . metoprolol tartrate (LOPRESSOR) 50 MG tablet Take 1 tablet (50 mg total) by mouth 2 (two) times daily. Please call [phone] for appointment for more refills thanks. FINAL attempt 30 tablet 0  . pioglitazone (  ACTOS) 30 MG tablet Take 30 mg by mouth daily.    . ranitidine (ZANTAC) 75 MG tablet Take 75 mg by mouth every other day.    . Testosterone Cypionate 200 MG/ML KIT Inject 200 mg into the muscle every 7 (seven) days.     No current facility-administered medications for this visit.     Family History  Problem Relation Age of Onset  . Cancer Mother 82       pancreatic  . Hyperlipidemia Mother   . Stroke Father 43  . Deep vein thrombosis Father   . Hyperlipidemia Father   . Hypertension Father     Social History   Socioeconomic History  . Marital status: Married    Spouse name: Not on file  . Number of children: Not on file  . Years of education: Not on file  . Highest education level: Not on file  Occupational History  . Not on file  Social Needs  . Financial resource strain: Not on file  . Food insecurity:    Worry: Not on file    Inability: Not on file  . Transportation needs:    Medical: Not on file    Non-medical: Not on file  Tobacco Use  . Smoking status: Former Smoker    Packs/day: 2.00    Years: 40.00    Pack years: 80.00    Types: Cigarettes    Last attempt to quit: 03/26/2009    Years since quitting: 9.5  . Smokeless tobacco: Never Used  Substance and Sexual Activity  . Alcohol use: No  . Drug use: No  . Sexual activity: Not on file  Lifestyle  . Physical activity:    Days per week: Not on file    Minutes per session: Not on file  . Stress: Not on file  Relationships  . Social connections:    Talks on phone: Not on file    Gets together: Not on file    Attends religious service: Not on file    Active  member of club or organization: Not on file    Attends meetings of clubs or organizations: Not on file    Relationship status: Not on file  . Intimate partner violence:    Fear of current or ex partner: Not on file    Emotionally abused: Not on file    Physically abused: Not on file    Forced sexual activity: Not on file  Other Topics Concern  . Not on file  Social History Narrative  . Not on file     REVIEW OF SYSTEMS:   [X] denotes positive finding, [ ] denotes negative finding Cardiac  Comments:  Chest pain or chest pressure:    Shortness of breath upon exertion:    Short of breath when lying flat:    Irregular heart rhythm:        Vascular    Pain in calf, thigh, or hip brought on by ambulation:    Pain in feet at night that wakes you up from your sleep:     Blood clot in your veins:    Leg swelling:         Pulmonary    Oxygen at home:    Productive cough:     Wheezing:         Neurologic    Sudden weakness in arms or legs:     Sudden numbness in arms or legs:     Sudden onset of difficulty speaking or  slurred speech:    Temporary loss of vision in one eye:     Problems with dizziness:         Gastrointestinal    Blood in stool:     Vomited blood:         Genitourinary    Burning when urinating:     Blood in urine:        Psychiatric    Major depression:         Hematologic    Bleeding problems:    Problems with blood clotting too easily:        Skin    Rashes or ulcers:        Constitutional    Fever or chills:      PHYSICAL EXAMINATION:  Today's Vitals   09/30/18 1310  BP: (!) 160/82  Pulse: 70  Resp: 16  Temp: 98.2 F (36.8 C)  TempSrc: Oral  SpO2: 98%  Weight: 226 lb 10.1 oz (102.8 kg)  Height: 5' 11" (1.803 m)   Body mass index is 31.61 kg/m.   General:  WDWN in NAD; vital signs documented above Gait: Not observed HENT: WNL, normocephalic Pulmonary: normal non-labored breathing , without Rales, rhonchi,  wheezing Cardiac:  regular HR, without  Murmurs; without carotid bruits Abdomen: soft, NT, no masses Skin: without rashes Vascular Exam/Pulses:  Right Left  Radial 1+ (weak) 1+ (weak)  Femoral 2+ (normal) 2+ (normal)  Popliteal Unable to palpate  Unable to palpate   DP Unable to palpate  Unable to palpate   PT Unable to palpate  Unable to palpate    Extremities: without ischemic changes, without Gangrene , without cellulitis; without open wounds;  Musculoskeletal: no muscle wasting or atrophy  Neurologic: A&O X 3;  No focal weakness or paresthesias are detected Psychiatric:  The pt has Normal affect.   Non-Invasive Vascular Imaging:   ABI's/TBI's on 09/30/2018: Right:  0.73/0.68 Left:  0.81/0.67  Previous ABI's/TBI's on 09/11/17: Right:  0.66/0.49 Left:  0.61/0.53 -stable ABI with moderate dz  Carotid duplex 09/30/2018: Right:  40-59% ICA stenosis Left:  Patent CEA site with no evidence of re-stenosis  Carotid duplex 09/11/17: Right:  40-59% ICA stenosis  Left:  CEA site < 40% at the proximal patch site -no significant change from previous exam   ASSESSMENT/PLAN:: 73 y.o. male here for follow up for follow up for aortobifemoral bypass grafitng for arterial occlusive dz in 1998 by Dr. Donnetta Hutching.  He also has had a left CEA in 1994 and redo CEA in 2001 also by Dr. Donnetta Hutching.      -intermittent claudication:  is stable with left leg worse than right.  He is diabetic and has diabetic neuropathy.  Discussed with him to protect his feet and check them everyday. Should he develop any wounds that won't heal, he will contact us sooner.   -Carotid stenosis:  Stable since last visit and remains asymptomatic.  He does have some speech issues when he takes his gabapentin, however, when he stops this, it resolves (see HPI).  Discussed s/s of stroke and he knows to go to the ER should he develop any of these sx.    -continue asa/plavix/statin  -will have pt f/u in 6 months with repeat studies.  He will call  sooner should he have any issues.    Leontine Locket, PA-C [facility] [phone]  Clinic MD:   Oneida Alar

## 2018-09-30 ENCOUNTER — Other Ambulatory Visit: Payer: Self-pay

## 2018-09-30 ENCOUNTER — Ambulatory Visit: Payer: PPO | Admitting: Physician Assistant

## 2018-09-30 ENCOUNTER — Encounter: Payer: Self-pay | Admitting: Family

## 2018-09-30 ENCOUNTER — Ambulatory Visit (HOSPITAL_COMMUNITY)
Admission: RE | Admit: 2018-09-30 | Discharge: 2018-09-30 | Disposition: A | Discharge status: Home / Self Care | Payer: PPO | Source: Ambulatory Visit | Attending: Family | Admitting: Family

## 2018-09-30 ENCOUNTER — Ambulatory Visit (INDEPENDENT_AMBULATORY_CARE_PROVIDER_SITE_OTHER)
Admission: RE | Admit: 2018-09-30 | Discharge: 2018-09-30 | Disposition: A | Discharge status: Home / Self Care | Payer: PPO | Source: Ambulatory Visit | Attending: Family | Admitting: Family

## 2018-09-30 VITALS — BP 160/82 | HR 70 | Temp 98.2°F | Resp 16 | Ht 71.0 in | Wt 226.6 lb

## 2018-09-30 DIAGNOSIS — Z9889 Other specified postprocedural states: Secondary | ICD-10-CM

## 2018-09-30 DIAGNOSIS — I779 Disorder of arteries and arterioles, unspecified: Secondary | ICD-10-CM

## 2018-09-30 DIAGNOSIS — I6523 Occlusion and stenosis of bilateral carotid arteries: Secondary | ICD-10-CM

## 2018-10-11 DIAGNOSIS — H25012 Cortical age-related cataract, left eye: Secondary | ICD-10-CM | POA: Diagnosis not present

## 2018-10-11 DIAGNOSIS — H2512 Age-related nuclear cataract, left eye: Secondary | ICD-10-CM | POA: Diagnosis not present

## 2018-10-18 DIAGNOSIS — E1165 Type 2 diabetes mellitus with hyperglycemia: Secondary | ICD-10-CM | POA: Diagnosis not present

## 2018-10-19 DIAGNOSIS — H2512 Age-related nuclear cataract, left eye: Secondary | ICD-10-CM | POA: Diagnosis not present

## 2018-10-19 DIAGNOSIS — H25812 Combined forms of age-related cataract, left eye: Secondary | ICD-10-CM | POA: Diagnosis not present

## 2018-10-25 DIAGNOSIS — H2512 Age-related nuclear cataract, left eye: Secondary | ICD-10-CM | POA: Diagnosis not present

## 2018-10-27 DIAGNOSIS — R718 Other abnormality of red blood cells: Secondary | ICD-10-CM | POA: Diagnosis not present

## 2018-10-27 DIAGNOSIS — R202 Paresthesia of skin: Secondary | ICD-10-CM | POA: Diagnosis not present

## 2018-10-27 DIAGNOSIS — R0789 Other chest pain: Secondary | ICD-10-CM | POA: Diagnosis not present

## 2018-10-27 DIAGNOSIS — E291 Testicular hypofunction: Secondary | ICD-10-CM | POA: Diagnosis not present

## 2018-10-27 DIAGNOSIS — G629 Polyneuropathy, unspecified: Secondary | ICD-10-CM | POA: Diagnosis not present

## 2018-11-03 DIAGNOSIS — N184 Chronic kidney disease, stage 4 (severe): Secondary | ICD-10-CM | POA: Diagnosis not present

## 2018-11-03 DIAGNOSIS — I2510 Atherosclerotic heart disease of native coronary artery without angina pectoris: Secondary | ICD-10-CM | POA: Diagnosis not present

## 2018-11-03 DIAGNOSIS — E1142 Type 2 diabetes mellitus with diabetic polyneuropathy: Secondary | ICD-10-CM | POA: Diagnosis not present

## 2018-11-03 DIAGNOSIS — I10 Essential (primary) hypertension: Secondary | ICD-10-CM | POA: Diagnosis not present

## 2018-11-18 DIAGNOSIS — E1165 Type 2 diabetes mellitus with hyperglycemia: Secondary | ICD-10-CM | POA: Diagnosis not present

## 2018-11-18 DIAGNOSIS — I10 Essential (primary) hypertension: Secondary | ICD-10-CM | POA: Diagnosis not present

## 2018-11-18 DIAGNOSIS — Z01818 Encounter for other preprocedural examination: Secondary | ICD-10-CM | POA: Diagnosis not present

## 2018-12-20 DIAGNOSIS — E1165 Type 2 diabetes mellitus with hyperglycemia: Secondary | ICD-10-CM | POA: Diagnosis not present

## 2019-01-05 DIAGNOSIS — I10 Essential (primary) hypertension: Secondary | ICD-10-CM | POA: Diagnosis not present

## 2019-01-05 DIAGNOSIS — M8580 Other specified disorders of bone density and structure, unspecified site: Secondary | ICD-10-CM | POA: Diagnosis not present

## 2019-01-05 DIAGNOSIS — E1142 Type 2 diabetes mellitus with diabetic polyneuropathy: Secondary | ICD-10-CM | POA: Diagnosis not present

## 2019-01-05 DIAGNOSIS — I2510 Atherosclerotic heart disease of native coronary artery without angina pectoris: Secondary | ICD-10-CM | POA: Diagnosis not present

## 2019-01-05 DIAGNOSIS — E7800 Pure hypercholesterolemia, unspecified: Secondary | ICD-10-CM | POA: Diagnosis not present

## 2019-01-05 DIAGNOSIS — N184 Chronic kidney disease, stage 4 (severe): Secondary | ICD-10-CM | POA: Diagnosis not present

## 2019-01-14 DIAGNOSIS — R0982 Postnasal drip: Secondary | ICD-10-CM | POA: Diagnosis not present

## 2019-01-14 DIAGNOSIS — I10 Essential (primary) hypertension: Secondary | ICD-10-CM | POA: Diagnosis not present

## 2019-01-14 DIAGNOSIS — R3911 Hesitancy of micturition: Secondary | ICD-10-CM | POA: Diagnosis not present

## 2019-01-14 DIAGNOSIS — R0602 Shortness of breath: Secondary | ICD-10-CM | POA: Diagnosis not present

## 2019-01-20 DIAGNOSIS — E1165 Type 2 diabetes mellitus with hyperglycemia: Secondary | ICD-10-CM | POA: Diagnosis not present

## 2019-02-02 DIAGNOSIS — C44629 Squamous cell carcinoma of skin of left upper limb, including shoulder: Secondary | ICD-10-CM | POA: Diagnosis not present

## 2019-02-02 DIAGNOSIS — L570 Actinic keratosis: Secondary | ICD-10-CM | POA: Diagnosis not present

## 2019-02-02 DIAGNOSIS — D485 Neoplasm of uncertain behavior of skin: Secondary | ICD-10-CM | POA: Diagnosis not present

## 2019-02-02 DIAGNOSIS — X32XXXA Exposure to sunlight, initial encounter: Secondary | ICD-10-CM | POA: Diagnosis not present

## 2019-02-02 DIAGNOSIS — Z08 Encounter for follow-up examination after completed treatment for malignant neoplasm: Secondary | ICD-10-CM | POA: Diagnosis not present

## 2019-02-02 DIAGNOSIS — Z85828 Personal history of other malignant neoplasm of skin: Secondary | ICD-10-CM | POA: Diagnosis not present

## 2019-02-02 DIAGNOSIS — L820 Inflamed seborrheic keratosis: Secondary | ICD-10-CM | POA: Diagnosis not present

## 2019-02-02 DIAGNOSIS — L218 Other seborrheic dermatitis: Secondary | ICD-10-CM | POA: Diagnosis not present

## 2019-02-02 DIAGNOSIS — R58 Hemorrhage, not elsewhere classified: Secondary | ICD-10-CM | POA: Diagnosis not present

## 2019-02-02 DIAGNOSIS — D044 Carcinoma in situ of skin of scalp and neck: Secondary | ICD-10-CM | POA: Diagnosis not present

## 2019-02-09 DIAGNOSIS — I129 Hypertensive chronic kidney disease with stage 1 through stage 4 chronic kidney disease, or unspecified chronic kidney disease: Secondary | ICD-10-CM | POA: Diagnosis not present

## 2019-02-09 DIAGNOSIS — E871 Hypo-osmolality and hyponatremia: Secondary | ICD-10-CM | POA: Diagnosis not present

## 2019-02-09 DIAGNOSIS — R809 Proteinuria, unspecified: Secondary | ICD-10-CM | POA: Diagnosis not present

## 2019-02-09 DIAGNOSIS — E1122 Type 2 diabetes mellitus with diabetic chronic kidney disease: Secondary | ICD-10-CM | POA: Diagnosis not present

## 2019-02-09 DIAGNOSIS — G4733 Obstructive sleep apnea (adult) (pediatric): Secondary | ICD-10-CM | POA: Diagnosis not present

## 2019-02-09 DIAGNOSIS — N183 Chronic kidney disease, stage 3 (moderate): Secondary | ICD-10-CM | POA: Diagnosis not present

## 2019-02-09 DIAGNOSIS — N179 Acute kidney failure, unspecified: Secondary | ICD-10-CM | POA: Diagnosis not present

## 2019-02-09 DIAGNOSIS — E785 Hyperlipidemia, unspecified: Secondary | ICD-10-CM | POA: Diagnosis not present

## 2019-02-09 DIAGNOSIS — I2510 Atherosclerotic heart disease of native coronary artery without angina pectoris: Secondary | ICD-10-CM | POA: Diagnosis not present

## 2019-02-09 DIAGNOSIS — I639 Cerebral infarction, unspecified: Secondary | ICD-10-CM | POA: Diagnosis not present

## 2019-02-10 DIAGNOSIS — C44629 Squamous cell carcinoma of skin of left upper limb, including shoulder: Secondary | ICD-10-CM | POA: Diagnosis not present

## 2019-02-15 DIAGNOSIS — G4733 Obstructive sleep apnea (adult) (pediatric): Secondary | ICD-10-CM | POA: Diagnosis not present

## 2019-02-18 DIAGNOSIS — N183 Chronic kidney disease, stage 3 (moderate): Secondary | ICD-10-CM | POA: Diagnosis not present

## 2019-02-22 DIAGNOSIS — M9902 Segmental and somatic dysfunction of thoracic region: Secondary | ICD-10-CM | POA: Diagnosis not present

## 2019-02-22 DIAGNOSIS — M5033 Other cervical disc degeneration, cervicothoracic region: Secondary | ICD-10-CM | POA: Diagnosis not present

## 2019-02-22 DIAGNOSIS — M9901 Segmental and somatic dysfunction of cervical region: Secondary | ICD-10-CM | POA: Diagnosis not present

## 2019-02-22 DIAGNOSIS — M5412 Radiculopathy, cervical region: Secondary | ICD-10-CM | POA: Diagnosis not present

## 2019-02-23 DIAGNOSIS — H02889 Meibomian gland dysfunction of unspecified eye, unspecified eyelid: Secondary | ICD-10-CM | POA: Diagnosis not present

## 2019-02-23 DIAGNOSIS — H401131 Primary open-angle glaucoma, bilateral, mild stage: Secondary | ICD-10-CM | POA: Diagnosis not present

## 2019-02-24 DIAGNOSIS — M5412 Radiculopathy, cervical region: Secondary | ICD-10-CM | POA: Diagnosis not present

## 2019-02-24 DIAGNOSIS — M9901 Segmental and somatic dysfunction of cervical region: Secondary | ICD-10-CM | POA: Diagnosis not present

## 2019-02-24 DIAGNOSIS — D044 Carcinoma in situ of skin of scalp and neck: Secondary | ICD-10-CM | POA: Diagnosis not present

## 2019-02-24 DIAGNOSIS — M9902 Segmental and somatic dysfunction of thoracic region: Secondary | ICD-10-CM | POA: Diagnosis not present

## 2019-02-24 DIAGNOSIS — M5033 Other cervical disc degeneration, cervicothoracic region: Secondary | ICD-10-CM | POA: Diagnosis not present

## 2019-03-01 DIAGNOSIS — M9901 Segmental and somatic dysfunction of cervical region: Secondary | ICD-10-CM | POA: Diagnosis not present

## 2019-03-01 DIAGNOSIS — M5412 Radiculopathy, cervical region: Secondary | ICD-10-CM | POA: Diagnosis not present

## 2019-03-01 DIAGNOSIS — M5033 Other cervical disc degeneration, cervicothoracic region: Secondary | ICD-10-CM | POA: Diagnosis not present

## 2019-03-01 DIAGNOSIS — M9902 Segmental and somatic dysfunction of thoracic region: Secondary | ICD-10-CM | POA: Diagnosis not present

## 2019-03-02 DIAGNOSIS — I129 Hypertensive chronic kidney disease with stage 1 through stage 4 chronic kidney disease, or unspecified chronic kidney disease: Secondary | ICD-10-CM | POA: Diagnosis not present

## 2019-03-02 DIAGNOSIS — N183 Chronic kidney disease, stage 3 (moderate): Secondary | ICD-10-CM | POA: Diagnosis not present

## 2019-03-03 DIAGNOSIS — E1165 Type 2 diabetes mellitus with hyperglycemia: Secondary | ICD-10-CM | POA: Diagnosis not present

## 2019-03-03 DIAGNOSIS — M9901 Segmental and somatic dysfunction of cervical region: Secondary | ICD-10-CM | POA: Diagnosis not present

## 2019-03-03 DIAGNOSIS — M5412 Radiculopathy, cervical region: Secondary | ICD-10-CM | POA: Diagnosis not present

## 2019-03-03 DIAGNOSIS — M9902 Segmental and somatic dysfunction of thoracic region: Secondary | ICD-10-CM | POA: Diagnosis not present

## 2019-03-03 DIAGNOSIS — M5033 Other cervical disc degeneration, cervicothoracic region: Secondary | ICD-10-CM | POA: Diagnosis not present

## 2019-03-07 DIAGNOSIS — M9902 Segmental and somatic dysfunction of thoracic region: Secondary | ICD-10-CM | POA: Diagnosis not present

## 2019-03-07 DIAGNOSIS — M9901 Segmental and somatic dysfunction of cervical region: Secondary | ICD-10-CM | POA: Diagnosis not present

## 2019-03-07 DIAGNOSIS — M5033 Other cervical disc degeneration, cervicothoracic region: Secondary | ICD-10-CM | POA: Diagnosis not present

## 2019-03-07 DIAGNOSIS — M5412 Radiculopathy, cervical region: Secondary | ICD-10-CM | POA: Diagnosis not present

## 2019-03-09 DIAGNOSIS — M9902 Segmental and somatic dysfunction of thoracic region: Secondary | ICD-10-CM | POA: Diagnosis not present

## 2019-03-09 DIAGNOSIS — M5412 Radiculopathy, cervical region: Secondary | ICD-10-CM | POA: Diagnosis not present

## 2019-03-09 DIAGNOSIS — M5033 Other cervical disc degeneration, cervicothoracic region: Secondary | ICD-10-CM | POA: Diagnosis not present

## 2019-03-09 DIAGNOSIS — M9901 Segmental and somatic dysfunction of cervical region: Secondary | ICD-10-CM | POA: Diagnosis not present

## 2019-03-14 DIAGNOSIS — M9902 Segmental and somatic dysfunction of thoracic region: Secondary | ICD-10-CM | POA: Diagnosis not present

## 2019-03-14 DIAGNOSIS — M5033 Other cervical disc degeneration, cervicothoracic region: Secondary | ICD-10-CM | POA: Diagnosis not present

## 2019-03-14 DIAGNOSIS — M9901 Segmental and somatic dysfunction of cervical region: Secondary | ICD-10-CM | POA: Diagnosis not present

## 2019-03-14 DIAGNOSIS — M5412 Radiculopathy, cervical region: Secondary | ICD-10-CM | POA: Diagnosis not present

## 2019-03-21 DIAGNOSIS — M9902 Segmental and somatic dysfunction of thoracic region: Secondary | ICD-10-CM | POA: Diagnosis not present

## 2019-03-21 DIAGNOSIS — M5033 Other cervical disc degeneration, cervicothoracic region: Secondary | ICD-10-CM | POA: Diagnosis not present

## 2019-03-21 DIAGNOSIS — M5412 Radiculopathy, cervical region: Secondary | ICD-10-CM | POA: Diagnosis not present

## 2019-03-21 DIAGNOSIS — M9901 Segmental and somatic dysfunction of cervical region: Secondary | ICD-10-CM | POA: Diagnosis not present

## 2019-03-24 DIAGNOSIS — M5412 Radiculopathy, cervical region: Secondary | ICD-10-CM | POA: Diagnosis not present

## 2019-03-24 DIAGNOSIS — M9901 Segmental and somatic dysfunction of cervical region: Secondary | ICD-10-CM | POA: Diagnosis not present

## 2019-03-24 DIAGNOSIS — M9902 Segmental and somatic dysfunction of thoracic region: Secondary | ICD-10-CM | POA: Diagnosis not present

## 2019-03-24 DIAGNOSIS — M5033 Other cervical disc degeneration, cervicothoracic region: Secondary | ICD-10-CM | POA: Diagnosis not present

## 2019-03-28 DIAGNOSIS — M9902 Segmental and somatic dysfunction of thoracic region: Secondary | ICD-10-CM | POA: Diagnosis not present

## 2019-03-28 DIAGNOSIS — M5412 Radiculopathy, cervical region: Secondary | ICD-10-CM | POA: Diagnosis not present

## 2019-03-28 DIAGNOSIS — M9901 Segmental and somatic dysfunction of cervical region: Secondary | ICD-10-CM | POA: Diagnosis not present

## 2019-03-28 DIAGNOSIS — M5033 Other cervical disc degeneration, cervicothoracic region: Secondary | ICD-10-CM | POA: Diagnosis not present

## 2019-03-29 DIAGNOSIS — Z794 Long term (current) use of insulin: Secondary | ICD-10-CM | POA: Diagnosis not present

## 2019-03-29 DIAGNOSIS — N289 Disorder of kidney and ureter, unspecified: Secondary | ICD-10-CM | POA: Diagnosis not present

## 2019-03-29 DIAGNOSIS — E119 Type 2 diabetes mellitus without complications: Secondary | ICD-10-CM | POA: Diagnosis not present

## 2019-03-29 DIAGNOSIS — R609 Edema, unspecified: Secondary | ICD-10-CM | POA: Diagnosis not present

## 2019-03-30 DIAGNOSIS — M5033 Other cervical disc degeneration, cervicothoracic region: Secondary | ICD-10-CM | POA: Diagnosis not present

## 2019-03-30 DIAGNOSIS — M9901 Segmental and somatic dysfunction of cervical region: Secondary | ICD-10-CM | POA: Diagnosis not present

## 2019-03-30 DIAGNOSIS — M9902 Segmental and somatic dysfunction of thoracic region: Secondary | ICD-10-CM | POA: Diagnosis not present

## 2019-03-30 DIAGNOSIS — M5412 Radiculopathy, cervical region: Secondary | ICD-10-CM | POA: Diagnosis not present

## 2019-04-01 DIAGNOSIS — M5412 Radiculopathy, cervical region: Secondary | ICD-10-CM | POA: Diagnosis not present

## 2019-04-01 DIAGNOSIS — M5033 Other cervical disc degeneration, cervicothoracic region: Secondary | ICD-10-CM | POA: Diagnosis not present

## 2019-04-01 DIAGNOSIS — M9901 Segmental and somatic dysfunction of cervical region: Secondary | ICD-10-CM | POA: Diagnosis not present

## 2019-04-01 DIAGNOSIS — M9902 Segmental and somatic dysfunction of thoracic region: Secondary | ICD-10-CM | POA: Diagnosis not present

## 2019-04-04 DIAGNOSIS — M5033 Other cervical disc degeneration, cervicothoracic region: Secondary | ICD-10-CM | POA: Diagnosis not present

## 2019-04-04 DIAGNOSIS — M5412 Radiculopathy, cervical region: Secondary | ICD-10-CM | POA: Diagnosis not present

## 2019-04-04 DIAGNOSIS — M9901 Segmental and somatic dysfunction of cervical region: Secondary | ICD-10-CM | POA: Diagnosis not present

## 2019-04-04 DIAGNOSIS — E1165 Type 2 diabetes mellitus with hyperglycemia: Secondary | ICD-10-CM | POA: Diagnosis not present

## 2019-04-04 DIAGNOSIS — M9902 Segmental and somatic dysfunction of thoracic region: Secondary | ICD-10-CM | POA: Diagnosis not present

## 2019-04-06 DIAGNOSIS — M9902 Segmental and somatic dysfunction of thoracic region: Secondary | ICD-10-CM | POA: Diagnosis not present

## 2019-04-06 DIAGNOSIS — M5412 Radiculopathy, cervical region: Secondary | ICD-10-CM | POA: Diagnosis not present

## 2019-04-06 DIAGNOSIS — M5033 Other cervical disc degeneration, cervicothoracic region: Secondary | ICD-10-CM | POA: Diagnosis not present

## 2019-04-06 DIAGNOSIS — M9901 Segmental and somatic dysfunction of cervical region: Secondary | ICD-10-CM | POA: Diagnosis not present

## 2019-04-11 DIAGNOSIS — M9902 Segmental and somatic dysfunction of thoracic region: Secondary | ICD-10-CM | POA: Diagnosis not present

## 2019-04-11 DIAGNOSIS — M5412 Radiculopathy, cervical region: Secondary | ICD-10-CM | POA: Diagnosis not present

## 2019-04-11 DIAGNOSIS — M9901 Segmental and somatic dysfunction of cervical region: Secondary | ICD-10-CM | POA: Diagnosis not present

## 2019-04-11 DIAGNOSIS — M5033 Other cervical disc degeneration, cervicothoracic region: Secondary | ICD-10-CM | POA: Diagnosis not present

## 2019-04-13 DIAGNOSIS — M9902 Segmental and somatic dysfunction of thoracic region: Secondary | ICD-10-CM | POA: Diagnosis not present

## 2019-04-13 DIAGNOSIS — M5033 Other cervical disc degeneration, cervicothoracic region: Secondary | ICD-10-CM | POA: Diagnosis not present

## 2019-04-13 DIAGNOSIS — M5412 Radiculopathy, cervical region: Secondary | ICD-10-CM | POA: Diagnosis not present

## 2019-04-13 DIAGNOSIS — M9901 Segmental and somatic dysfunction of cervical region: Secondary | ICD-10-CM | POA: Diagnosis not present

## 2019-04-14 DIAGNOSIS — E1122 Type 2 diabetes mellitus with diabetic chronic kidney disease: Secondary | ICD-10-CM | POA: Diagnosis not present

## 2019-04-14 DIAGNOSIS — R809 Proteinuria, unspecified: Secondary | ICD-10-CM | POA: Diagnosis not present

## 2019-04-14 DIAGNOSIS — I2510 Atherosclerotic heart disease of native coronary artery without angina pectoris: Secondary | ICD-10-CM | POA: Diagnosis not present

## 2019-04-14 DIAGNOSIS — E785 Hyperlipidemia, unspecified: Secondary | ICD-10-CM | POA: Diagnosis not present

## 2019-04-14 DIAGNOSIS — I129 Hypertensive chronic kidney disease with stage 1 through stage 4 chronic kidney disease, or unspecified chronic kidney disease: Secondary | ICD-10-CM | POA: Diagnosis not present

## 2019-04-14 DIAGNOSIS — G4733 Obstructive sleep apnea (adult) (pediatric): Secondary | ICD-10-CM | POA: Diagnosis not present

## 2019-04-14 DIAGNOSIS — N183 Chronic kidney disease, stage 3 (moderate): Secondary | ICD-10-CM | POA: Diagnosis not present

## 2019-04-14 DIAGNOSIS — N179 Acute kidney failure, unspecified: Secondary | ICD-10-CM | POA: Diagnosis not present

## 2019-04-14 DIAGNOSIS — I639 Cerebral infarction, unspecified: Secondary | ICD-10-CM | POA: Diagnosis not present

## 2019-04-14 DIAGNOSIS — E871 Hypo-osmolality and hyponatremia: Secondary | ICD-10-CM | POA: Diagnosis not present

## 2019-04-19 DIAGNOSIS — M5033 Other cervical disc degeneration, cervicothoracic region: Secondary | ICD-10-CM | POA: Diagnosis not present

## 2019-04-19 DIAGNOSIS — M9901 Segmental and somatic dysfunction of cervical region: Secondary | ICD-10-CM | POA: Diagnosis not present

## 2019-04-19 DIAGNOSIS — M9902 Segmental and somatic dysfunction of thoracic region: Secondary | ICD-10-CM | POA: Diagnosis not present

## 2019-04-19 DIAGNOSIS — M5412 Radiculopathy, cervical region: Secondary | ICD-10-CM | POA: Diagnosis not present

## 2019-04-25 DIAGNOSIS — M5412 Radiculopathy, cervical region: Secondary | ICD-10-CM | POA: Diagnosis not present

## 2019-04-25 DIAGNOSIS — M5033 Other cervical disc degeneration, cervicothoracic region: Secondary | ICD-10-CM | POA: Diagnosis not present

## 2019-04-25 DIAGNOSIS — M9902 Segmental and somatic dysfunction of thoracic region: Secondary | ICD-10-CM | POA: Diagnosis not present

## 2019-04-25 DIAGNOSIS — M9901 Segmental and somatic dysfunction of cervical region: Secondary | ICD-10-CM | POA: Diagnosis not present

## 2019-04-28 ENCOUNTER — Ambulatory Visit: Payer: Self-pay | Admitting: Podiatry

## 2019-05-02 DIAGNOSIS — M9902 Segmental and somatic dysfunction of thoracic region: Secondary | ICD-10-CM | POA: Diagnosis not present

## 2019-05-02 DIAGNOSIS — M5412 Radiculopathy, cervical region: Secondary | ICD-10-CM | POA: Diagnosis not present

## 2019-05-02 DIAGNOSIS — M9901 Segmental and somatic dysfunction of cervical region: Secondary | ICD-10-CM | POA: Diagnosis not present

## 2019-05-02 DIAGNOSIS — M5033 Other cervical disc degeneration, cervicothoracic region: Secondary | ICD-10-CM | POA: Diagnosis not present

## 2019-05-05 DIAGNOSIS — E1165 Type 2 diabetes mellitus with hyperglycemia: Secondary | ICD-10-CM | POA: Diagnosis not present

## 2019-05-09 DIAGNOSIS — M9902 Segmental and somatic dysfunction of thoracic region: Secondary | ICD-10-CM | POA: Diagnosis not present

## 2019-05-09 DIAGNOSIS — M9901 Segmental and somatic dysfunction of cervical region: Secondary | ICD-10-CM | POA: Diagnosis not present

## 2019-05-09 DIAGNOSIS — M5412 Radiculopathy, cervical region: Secondary | ICD-10-CM | POA: Diagnosis not present

## 2019-05-09 DIAGNOSIS — M5033 Other cervical disc degeneration, cervicothoracic region: Secondary | ICD-10-CM | POA: Diagnosis not present

## 2019-05-16 DIAGNOSIS — E7800 Pure hypercholesterolemia, unspecified: Secondary | ICD-10-CM | POA: Diagnosis not present

## 2019-05-16 DIAGNOSIS — I10 Essential (primary) hypertension: Secondary | ICD-10-CM | POA: Diagnosis not present

## 2019-05-16 DIAGNOSIS — M545 Low back pain: Secondary | ICD-10-CM | POA: Diagnosis not present

## 2019-05-16 DIAGNOSIS — N184 Chronic kidney disease, stage 4 (severe): Secondary | ICD-10-CM | POA: Diagnosis not present

## 2019-05-16 DIAGNOSIS — I739 Peripheral vascular disease, unspecified: Secondary | ICD-10-CM | POA: Diagnosis not present

## 2019-05-16 DIAGNOSIS — M9902 Segmental and somatic dysfunction of thoracic region: Secondary | ICD-10-CM | POA: Diagnosis not present

## 2019-05-16 DIAGNOSIS — I679 Cerebrovascular disease, unspecified: Secondary | ICD-10-CM | POA: Diagnosis not present

## 2019-05-16 DIAGNOSIS — M9901 Segmental and somatic dysfunction of cervical region: Secondary | ICD-10-CM | POA: Diagnosis not present

## 2019-05-16 DIAGNOSIS — Z0000 Encounter for general adult medical examination without abnormal findings: Secondary | ICD-10-CM | POA: Diagnosis not present

## 2019-05-16 DIAGNOSIS — K219 Gastro-esophageal reflux disease without esophagitis: Secondary | ICD-10-CM | POA: Diagnosis not present

## 2019-05-16 DIAGNOSIS — M5412 Radiculopathy, cervical region: Secondary | ICD-10-CM | POA: Diagnosis not present

## 2019-05-16 DIAGNOSIS — Z125 Encounter for screening for malignant neoplasm of prostate: Secondary | ICD-10-CM | POA: Diagnosis not present

## 2019-05-16 DIAGNOSIS — M5033 Other cervical disc degeneration, cervicothoracic region: Secondary | ICD-10-CM | POA: Diagnosis not present

## 2019-05-16 DIAGNOSIS — Z79899 Other long term (current) drug therapy: Secondary | ICD-10-CM | POA: Diagnosis not present

## 2019-05-16 DIAGNOSIS — E291 Testicular hypofunction: Secondary | ICD-10-CM | POA: Diagnosis not present

## 2019-05-23 DIAGNOSIS — M9902 Segmental and somatic dysfunction of thoracic region: Secondary | ICD-10-CM | POA: Diagnosis not present

## 2019-05-23 DIAGNOSIS — M9901 Segmental and somatic dysfunction of cervical region: Secondary | ICD-10-CM | POA: Diagnosis not present

## 2019-05-23 DIAGNOSIS — M5412 Radiculopathy, cervical region: Secondary | ICD-10-CM | POA: Diagnosis not present

## 2019-05-23 DIAGNOSIS — M5033 Other cervical disc degeneration, cervicothoracic region: Secondary | ICD-10-CM | POA: Diagnosis not present

## 2019-05-25 ENCOUNTER — Encounter: Payer: Self-pay | Admitting: Podiatry

## 2019-05-25 ENCOUNTER — Ambulatory Visit: Payer: PPO | Admitting: Podiatry

## 2019-05-25 ENCOUNTER — Other Ambulatory Visit: Payer: Self-pay

## 2019-05-25 VITALS — BP 150/79 | HR 78

## 2019-05-25 DIAGNOSIS — Z79899 Other long term (current) drug therapy: Secondary | ICD-10-CM | POA: Diagnosis not present

## 2019-05-25 DIAGNOSIS — E1159 Type 2 diabetes mellitus with other circulatory complications: Secondary | ICD-10-CM

## 2019-05-25 DIAGNOSIS — K219 Gastro-esophageal reflux disease without esophagitis: Secondary | ICD-10-CM | POA: Diagnosis not present

## 2019-05-25 DIAGNOSIS — I10 Essential (primary) hypertension: Secondary | ICD-10-CM | POA: Diagnosis not present

## 2019-05-25 DIAGNOSIS — D689 Coagulation defect, unspecified: Secondary | ICD-10-CM

## 2019-05-25 DIAGNOSIS — I679 Cerebrovascular disease, unspecified: Secondary | ICD-10-CM | POA: Diagnosis not present

## 2019-05-25 DIAGNOSIS — Z0000 Encounter for general adult medical examination without abnormal findings: Secondary | ICD-10-CM | POA: Diagnosis not present

## 2019-05-25 DIAGNOSIS — E7800 Pure hypercholesterolemia, unspecified: Secondary | ICD-10-CM | POA: Diagnosis not present

## 2019-05-25 DIAGNOSIS — Z125 Encounter for screening for malignant neoplasm of prostate: Secondary | ICD-10-CM | POA: Diagnosis not present

## 2019-05-25 DIAGNOSIS — I739 Peripheral vascular disease, unspecified: Secondary | ICD-10-CM | POA: Diagnosis not present

## 2019-05-25 DIAGNOSIS — E291 Testicular hypofunction: Secondary | ICD-10-CM | POA: Diagnosis not present

## 2019-05-25 DIAGNOSIS — D6869 Other thrombophilia: Secondary | ICD-10-CM | POA: Insufficient documentation

## 2019-05-25 HISTORY — DX: Coagulation defect, unspecified: D68.9

## 2019-05-25 NOTE — Progress Notes (Signed)
This patient presents the office for his annual diabetic foot exam.  This patient has been diagnosed with diabetes with vascular disease previously.  He says he is having no pain or discomfort in his feet and his nails have recently been done.  Patient does give a history of heart surgery and kidney disease.  Patient is taking insulin and Plavix.  He presents the office today for an evaluation of his diabetic feet   Vascular  Dorsalis pedis and posterior tibial pulses are not  palpable  B/L.  Capillary return  WNL.  Temperature gradient is  WNL.  Skin turgor  WNL  Sensorium  Senn Weinstein monofilament wire  WNL. Normal tactile sensation.  Nail Exam  Patient has normal nails with no evidence of bacterial or fungal infection.  Orthopedic  Exam  Muscle tone and muscle strength  WNL.  No limitations of motion feet  B/L.  No crepitus or joint effusion noted.  Foot type is unremarkable and digits show no abnormalities.  Bony prominences are unremarkable.  Skin  No open lesions.  Normal skin texture and turgor..  Diabetes with vascular disease.  IE.  His diabetic foot exam reveals vascular disease in the absence of pedal pulses.  No evidence of any neuropathy feet bilaterally.  Patient to return to the office in 1 year for his next annual diabetic foot exam.   Gardiner Barefoot DPM

## 2019-05-30 DIAGNOSIS — M9902 Segmental and somatic dysfunction of thoracic region: Secondary | ICD-10-CM | POA: Diagnosis not present

## 2019-05-30 DIAGNOSIS — M5412 Radiculopathy, cervical region: Secondary | ICD-10-CM | POA: Diagnosis not present

## 2019-05-30 DIAGNOSIS — M5033 Other cervical disc degeneration, cervicothoracic region: Secondary | ICD-10-CM | POA: Diagnosis not present

## 2019-05-30 DIAGNOSIS — M9901 Segmental and somatic dysfunction of cervical region: Secondary | ICD-10-CM | POA: Diagnosis not present

## 2019-06-06 DIAGNOSIS — M5412 Radiculopathy, cervical region: Secondary | ICD-10-CM | POA: Diagnosis not present

## 2019-06-06 DIAGNOSIS — M5033 Other cervical disc degeneration, cervicothoracic region: Secondary | ICD-10-CM | POA: Diagnosis not present

## 2019-06-06 DIAGNOSIS — M9902 Segmental and somatic dysfunction of thoracic region: Secondary | ICD-10-CM | POA: Diagnosis not present

## 2019-06-06 DIAGNOSIS — M9901 Segmental and somatic dysfunction of cervical region: Secondary | ICD-10-CM | POA: Diagnosis not present

## 2019-06-06 DIAGNOSIS — E1165 Type 2 diabetes mellitus with hyperglycemia: Secondary | ICD-10-CM | POA: Diagnosis not present

## 2019-06-20 DIAGNOSIS — M5412 Radiculopathy, cervical region: Secondary | ICD-10-CM | POA: Diagnosis not present

## 2019-06-20 DIAGNOSIS — M9901 Segmental and somatic dysfunction of cervical region: Secondary | ICD-10-CM | POA: Diagnosis not present

## 2019-06-20 DIAGNOSIS — M9902 Segmental and somatic dysfunction of thoracic region: Secondary | ICD-10-CM | POA: Diagnosis not present

## 2019-06-20 DIAGNOSIS — M5033 Other cervical disc degeneration, cervicothoracic region: Secondary | ICD-10-CM | POA: Diagnosis not present

## 2019-06-22 DIAGNOSIS — D044 Carcinoma in situ of skin of scalp and neck: Secondary | ICD-10-CM | POA: Diagnosis not present

## 2019-06-22 DIAGNOSIS — L570 Actinic keratosis: Secondary | ICD-10-CM | POA: Diagnosis not present

## 2019-06-22 DIAGNOSIS — Z85828 Personal history of other malignant neoplasm of skin: Secondary | ICD-10-CM | POA: Diagnosis not present

## 2019-06-22 DIAGNOSIS — D485 Neoplasm of uncertain behavior of skin: Secondary | ICD-10-CM | POA: Diagnosis not present

## 2019-06-22 DIAGNOSIS — X32XXXA Exposure to sunlight, initial encounter: Secondary | ICD-10-CM | POA: Diagnosis not present

## 2019-06-22 DIAGNOSIS — Z08 Encounter for follow-up examination after completed treatment for malignant neoplasm: Secondary | ICD-10-CM | POA: Diagnosis not present

## 2019-06-29 ENCOUNTER — Other Ambulatory Visit: Payer: Self-pay | Admitting: *Deleted

## 2019-06-29 DIAGNOSIS — Z20822 Contact with and (suspected) exposure to covid-19: Secondary | ICD-10-CM

## 2019-06-30 ENCOUNTER — Other Ambulatory Visit: Payer: Self-pay | Admitting: *Deleted

## 2019-07-01 LAB — NOVEL CORONAVIRUS, NAA: SARS-CoV-2, NAA: NOT DETECTED

## 2019-07-04 DIAGNOSIS — M8580 Other specified disorders of bone density and structure, unspecified site: Secondary | ICD-10-CM | POA: Diagnosis not present

## 2019-07-04 DIAGNOSIS — M5412 Radiculopathy, cervical region: Secondary | ICD-10-CM | POA: Diagnosis not present

## 2019-07-04 DIAGNOSIS — E1142 Type 2 diabetes mellitus with diabetic polyneuropathy: Secondary | ICD-10-CM | POA: Diagnosis not present

## 2019-07-04 DIAGNOSIS — M5033 Other cervical disc degeneration, cervicothoracic region: Secondary | ICD-10-CM | POA: Diagnosis not present

## 2019-07-04 DIAGNOSIS — M9901 Segmental and somatic dysfunction of cervical region: Secondary | ICD-10-CM | POA: Diagnosis not present

## 2019-07-04 DIAGNOSIS — I2510 Atherosclerotic heart disease of native coronary artery without angina pectoris: Secondary | ICD-10-CM | POA: Diagnosis not present

## 2019-07-04 DIAGNOSIS — I10 Essential (primary) hypertension: Secondary | ICD-10-CM | POA: Diagnosis not present

## 2019-07-04 DIAGNOSIS — E119 Type 2 diabetes mellitus without complications: Secondary | ICD-10-CM | POA: Diagnosis not present

## 2019-07-04 DIAGNOSIS — M9902 Segmental and somatic dysfunction of thoracic region: Secondary | ICD-10-CM | POA: Diagnosis not present

## 2019-07-04 DIAGNOSIS — N184 Chronic kidney disease, stage 4 (severe): Secondary | ICD-10-CM | POA: Diagnosis not present

## 2019-07-04 DIAGNOSIS — E7800 Pure hypercholesterolemia, unspecified: Secondary | ICD-10-CM | POA: Diagnosis not present

## 2019-07-08 DIAGNOSIS — E1165 Type 2 diabetes mellitus with hyperglycemia: Secondary | ICD-10-CM | POA: Diagnosis not present

## 2019-07-13 DIAGNOSIS — N179 Acute kidney failure, unspecified: Secondary | ICD-10-CM | POA: Diagnosis not present

## 2019-07-13 DIAGNOSIS — I129 Hypertensive chronic kidney disease with stage 1 through stage 4 chronic kidney disease, or unspecified chronic kidney disease: Secondary | ICD-10-CM | POA: Diagnosis not present

## 2019-07-13 DIAGNOSIS — E871 Hypo-osmolality and hyponatremia: Secondary | ICD-10-CM | POA: Diagnosis not present

## 2019-07-13 DIAGNOSIS — N183 Chronic kidney disease, stage 3 unspecified: Secondary | ICD-10-CM | POA: Diagnosis not present

## 2019-07-13 DIAGNOSIS — R809 Proteinuria, unspecified: Secondary | ICD-10-CM | POA: Diagnosis not present

## 2019-07-13 DIAGNOSIS — I639 Cerebral infarction, unspecified: Secondary | ICD-10-CM | POA: Diagnosis not present

## 2019-07-13 DIAGNOSIS — I2510 Atherosclerotic heart disease of native coronary artery without angina pectoris: Secondary | ICD-10-CM | POA: Diagnosis not present

## 2019-07-13 DIAGNOSIS — E785 Hyperlipidemia, unspecified: Secondary | ICD-10-CM | POA: Diagnosis not present

## 2019-07-13 DIAGNOSIS — Z0000 Encounter for general adult medical examination without abnormal findings: Secondary | ICD-10-CM | POA: Diagnosis not present

## 2019-07-13 DIAGNOSIS — G4733 Obstructive sleep apnea (adult) (pediatric): Secondary | ICD-10-CM | POA: Diagnosis not present

## 2019-07-13 DIAGNOSIS — E1122 Type 2 diabetes mellitus with diabetic chronic kidney disease: Secondary | ICD-10-CM | POA: Diagnosis not present

## 2019-07-15 ENCOUNTER — Other Ambulatory Visit: Payer: Self-pay

## 2019-07-15 DIAGNOSIS — I6523 Occlusion and stenosis of bilateral carotid arteries: Secondary | ICD-10-CM

## 2019-07-15 DIAGNOSIS — I779 Disorder of arteries and arterioles, unspecified: Secondary | ICD-10-CM

## 2019-07-18 ENCOUNTER — Ambulatory Visit: Payer: PPO | Admitting: Family

## 2019-07-18 ENCOUNTER — Other Ambulatory Visit: Payer: Self-pay

## 2019-07-18 ENCOUNTER — Ambulatory Visit (INDEPENDENT_AMBULATORY_CARE_PROVIDER_SITE_OTHER)
Admission: RE | Admit: 2019-07-18 | Discharge: 2019-07-18 | Disposition: A | Discharge status: Home / Self Care | Payer: PPO | Source: Ambulatory Visit | Attending: Family | Admitting: Family

## 2019-07-18 ENCOUNTER — Ambulatory Visit (HOSPITAL_COMMUNITY)
Admission: RE | Admit: 2019-07-18 | Discharge: 2019-07-18 | Disposition: A | Discharge status: Home / Self Care | Payer: PPO | Source: Ambulatory Visit | Attending: Family | Admitting: Family

## 2019-07-18 ENCOUNTER — Encounter: Payer: Self-pay | Admitting: Family

## 2019-07-18 VITALS — BP 157/81 | HR 76 | Temp 97.6°F | Resp 16 | Ht 71.0 in | Wt 224.0 lb

## 2019-07-18 DIAGNOSIS — Z9889 Other specified postprocedural states: Secondary | ICD-10-CM | POA: Diagnosis not present

## 2019-07-18 DIAGNOSIS — I6523 Occlusion and stenosis of bilateral carotid arteries: Secondary | ICD-10-CM | POA: Insufficient documentation

## 2019-07-18 DIAGNOSIS — I779 Disorder of arteries and arterioles, unspecified: Secondary | ICD-10-CM

## 2019-07-18 DIAGNOSIS — Z95828 Presence of other vascular implants and grafts: Secondary | ICD-10-CM

## 2019-07-18 NOTE — Patient Instructions (Signed)
Stroke Prevention Some medical conditions and lifestyle choices can lead to a higher risk for a stroke. You can help to prevent a stroke by making nutrition, lifestyle, and other changes. What nutrition changes can be made?   Eat healthy foods. ? Choose foods that are high in fiber. These include:  Fresh fruits.  Fresh vegetables.  Whole grains. ? Eat at least 5 or more servings of fruits and vegetables each day. Try to fill half of your plate at each meal with fruits and vegetables. ? Choose lean protein foods. These include:  Lowfat (lean) cuts of meat.  Chicken without skin.  Fish.  Tofu.  Beans.  Nuts. ? Eat low-fat dairy products. ? Avoid foods that:  Are high in salt (sodium).  Have saturated fat.  Have trans fat.  Have cholesterol.  Are processed.  Are premade.  Follow eating guidelines as told by your doctor. These may include: ? Reducing how many calories you eat and drink each day. ? Limiting how much salt you eat or drink each day to 1,500 milligrams (mg). ? Using only healthy fats for cooking. These include:  Olive oil.  Canola oil.  Sunflower oil. ? Counting how many carbohydrates you eat and drink each day. What lifestyle changes can be made?  Try to stay at a healthy weight. Talk to your doctor about what a good weight is for you.  Get at least 30 minutes of moderate physical activity at least 5 days a week. This can include: ? Fast walking. ? Biking. ? Swimming.  Do not use any products that have nicotine or tobacco. This includes cigarettes and e-cigarettes. If you need help quitting, ask your doctor. Avoid being around tobacco smoke in general.  Limit how much alcohol you drink to no more than 1 drink a day for nonpregnant women and 2 drinks a day for men. One drink equals 12 oz of beer, 5 oz of wine, or 1 oz of hard liquor.  Do not use drugs.  Avoid taking birth control pills. Talk to your doctor about the risks of taking birth  control pills if: ? You are over 35 years old. ? You smoke. ? You get migraines. ? You have had a blood clot. What other changes can be made?  Manage your cholesterol. ? It is important to eat a healthy diet. ? If your cholesterol cannot be managed through your diet, you may also need to take medicines. Take medicines as told by your doctor.  Manage your diabetes. ? It is important to eat a healthy diet and to exercise regularly. ? If your blood sugar cannot be managed through diet and exercise, you may need to take medicines. Take medicines as told by your doctor.  Control your high blood pressure (hypertension). ? Try to keep your blood pressure below 130/80. This can help lower your risk of stroke. ? It is important to eat a healthy diet and to exercise regularly. ? If your blood pressure cannot be managed through diet and exercise, you may need to take medicines. Take medicines as told by your doctor. ? Ask your doctor if you should check your blood pressure at home. ? Have your blood pressure checked every year. Do this even if your blood pressure is normal.  Talk to your doctor about getting checked for a sleep disorder. Signs of this can include: ? Snoring a lot. ? Feeling very tired.  Take over-the-counter and prescription medicines only as told by your doctor. These may   include aspirin or blood thinners (antiplatelets or anticoagulants).  Make sure that any other medical conditions you have are managed. Where to find more information  American Stroke Association: www.strokeassociation.org  National Stroke Association: www.stroke.org Get help right away if:  You have any symptoms of stroke. "BE FAST" is an easy way to remember the main warning signs: ? B - Balance. Signs are dizziness, sudden trouble walking, or loss of balance. ? E - Eyes. Signs are trouble seeing or a sudden change in how you see. ? F - Face. Signs are sudden weakness or loss of feeling of the face,  or the face or eyelid drooping on one side. ? A - Arms. Signs are weakness or loss of feeling in an arm. This happens suddenly and usually on one side of the body. ? S - Speech. Signs are sudden trouble speaking, slurred speech, or trouble understanding what people say. ? T - Time. Time to call emergency services. Write down what time symptoms started.  You have other signs of stroke, such as: ? A sudden, very bad headache with no known cause. ? Feeling sick to your stomach (nausea). ? Throwing up (vomiting). ? Jerky movements you cannot control (seizure). These symptoms may represent a serious problem that is an emergency. Do not wait to see if the symptoms will go away. Get medical help right away. Call your local emergency services (911 in the [place] Do not drive yourself to the hospital. Summary  You can prevent a stroke by eating healthy, exercising, not smoking, drinking less alcohol, and treating other health problems, such as diabetes, high blood pressure, or high cholesterol.  Do not use any products that contain nicotine or tobacco, such as cigarettes and e-cigarettes.  Get help right away if you have any signs or symptoms of a stroke. This information is not intended to replace advice given to you by your health care provider. Make sure you discuss any questions you have with your health care provider. Document Released: 01/27/2012 Document Revised: 09/23/2018 Document Reviewed: 10/29/2016 Elsevier Patient Education  2020 Elsevier Inc.     Peripheral Vascular Disease  Peripheral vascular disease (PVD) is a disease of the blood vessels that are not part of your heart and brain. A simple term for PVD is poor circulation. In most cases, PVD narrows the blood vessels that carry blood from your heart to the rest of your body. This can reduce the supply of blood to your arms, legs, and internal organs, like your stomach or kidneys. However, PVD most often affects a person's lower  legs and feet. Without treatment, PVD tends to get worse. PVD can also lead to acute ischemic limb. This is when an arm or leg suddenly cannot get enough blood. This is a medical emergency. Follow these instructions at home: Lifestyle  Do not use any products that contain nicotine or tobacco, such as cigarettes and e-cigarettes. If you need help quitting, ask your doctor.  Lose weight if you are overweight. Or, stay at a healthy weight as told by your doctor.  Eat a diet that is low in fat and cholesterol. If you need help, ask your doctor.  Exercise regularly. Ask your doctor for activities that are right for you. General instructions  Take over-the-counter and prescription medicines only as told by your doctor.  Take good care of your feet: ? Wear comfortable shoes that fit well. ? Check your feet often for any cuts or sores.  Keep all follow-up visits as told   by your doctor This is important. Contact a doctor if:  You have cramps in your legs when you walk.  You have leg pain when you are at rest.  You have coldness in a leg or foot.  Your skin changes.  You are unable to get or have an erection (erectile dysfunction).  You have cuts or sores on your feet that do not heal. Get help right away if:  Your arm or leg turns cold, numb, and blue.  Your arms or legs become red, warm, swollen, painful, or numb.  You have chest pain.  You have trouble breathing.  You suddenly have weakness in your face, arm, or leg.  You become very confused or you cannot speak.  You suddenly have a very bad headache.  You suddenly cannot see. Summary  Peripheral vascular disease (PVD) is a disease of the blood vessels.  A simple term for PVD is poor circulation. Without treatment, PVD tends to get worse.  Treatment may include exercise, low fat and low cholesterol diet, and quitting smoking. This information is not intended to replace advice given to you by your health care  provider. Make sure you discuss any questions you have with your health care provider. Document Released: 10/22/2009 Document Revised: 07/10/2017 Document Reviewed: 09/04/2016 Elsevier Patient Education  2020 Elsevier Inc.  

## 2019-07-18 NOTE — Progress Notes (Addendum)
[facility] [place] HISTORY AND PHYSICAL   CC: Follow up extracranial carotid artery stenosis and peripheral artery occlusive disease    History of Present Illness:   Bradley [name] is a 73 y.o. male returns today for followup of his diffuse peripheral vascular occlusive disease. He undergone prior aortobifemoral bypass for lower extremity arterial occlusive disease in 1998 by Dr. Donnetta Hutching. He underwent a left carotid endarterectomy in 1994 with a redo for recurrent stenosis in 2001 by Dr. Donnetta Hutching.  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   Pt reports stroke at age 57, then several TIA's, the last TIA was the night before the 2001 redo of the left CEA.  He had a presyncopal feeling on a hot day in July 2018 playing golf.   He had 3 vessel CABG on 05/23/15 by Dr. Servando Snare.  Pt states he did not have an MI, but was tired all the time; stress test showed the degree of CAD. He continues cardiac rehab by seeing a physical trainer.   He states his blood pressure at home runs about 125/67.   He sees a nephrologist, has states he currently has stage 4 CKD.  Diabetic: Yes, states his last A1C was 7.1 He has tried a couple of GLP1 medications  Tobacco use: former smoker, quit in 2010  Pt meds include: Statin :Yes Betablocker: Yes ASA: Yes Other anticoagulants/antiplatelets: Plavix   Current Outpatient Medications  Medication Sig Dispense Refill  . aspirin EC 81 MG EC tablet Take 1 tablet (81 mg total) by mouth daily.    Marland Kitchen atorvastatin (LIPITOR) 40 MG tablet Take 40 mg by mouth daily.      . clopidogrel (PLAVIX) 75 MG tablet Take 1 tablet (75 mg total) by mouth daily. 1 tablet 0  . dexlansoprazole (DEXILANT) 60 MG capsule Take 60 mg by mouth every  other day.     Marland Kitchen FIASP FLEXTOUCH 100 UNIT/ML SOPN INJECT 20 TO 25 UNITS AT MEALS THREE TIMES A DAY UP. TO 60 UNITS PER DAY    . furosemide (LASIX) 20 MG tablet TK 1 T PO QAM    . gabapentin (NEURONTIN) 300 MG capsule TK 1-2 CS PO QD TO BID    . hydrochlorothiazide (HYDRODIURIL) 12.5 MG tablet TK 1 T PO QD IN THE MORNING    . ketoconazole (NIZORAL) 2 % shampoo USE ON NECK AND FACE AS WASH. LEAVE ON 5 MINUTES. RINSE WELL USE 2-3 TIMES WEEKLY    . losartan (COZAAR) 50 MG tablet TK 1 T PO QD. TK WITH HCTZ D    . metoprolol tartrate (LOPRESSOR) 50 MG tablet Take 1 tablet (50 mg total) by mouth 2 (two) times daily. Please call [phone] for appointment for more refills thanks. FINAL attempt 30 tablet 0  . testosterone cypionate (DEPOTESTOSTERONE CYPIONATE) 200 MG/ML injection INJECT 0.6MLS IM Q WK    . TRESIBA FLEXTOUCH 100 UNIT/ML SOPN FlexTouch Pen INJECT 24 UNITS ONCE DAILY    . L-Methylfolate-Algae-B12-B6 (FOLTANX RF) 3-90.314-2-35 MG CAPS TK ONE C PO BID    . losartan-hydrochlorothiazide (HYZAAR) 50-12.5 MG tablet TAKE 1 TABLET BY MOUTH EVERY DAY (Patient not taking: Reported on 07/18/2019) 90 tablet 2   No current facility-administered medications for this visit.     Past Medical History:  Diagnosis Date  .  Anemia   . Arthritis   . Carotid artery occlusion    a. Carotid US 10/16: RICA 60-79%, L CEA patent with 1-39% stenosis  . Coronary artery disease    a. Myoview 9/16:  EF 52%, inferior fixed defect consistent with diaphragmatic attenuation, intermediate risk secondary to poor exercise tolerance and symptoms during stress;  b. LHC 05/17/2015 90% mid LAD, 80% ost D2, 75% mid RCA, 35% prox RCA. >> S/p CABG  . Diabetes mellitus age 25  . Diverticulitis   . GERD (gastroesophageal reflux disease)   . H/O hiatal hernia   . History of echocardiogram    a. Echo 9/16: GLS -15.2%, EF 93-26%, grade 1 diastolic dysfunction, normal wall motion, aortic sclerosis, dilated aortic root 39 mm, atrial  septal lipomatous hypertrophy  . Hyperlipidemia   . Hypertension   . Irritable bowel syndrome (IBS)   . Myocardial infarction [facility])    silent inferior MI; patient denies MI history (03/17/13)   . Neuropathy 2013  . Osteoporosis 2013  . PAF (paroxysmal atrial fibrillation) ([place])    post CABG; Amiodarone stopped 2/2 wheezing;   . Peripheral vascular disease ([place])   . Reflux esophagitis   . Sleep apnea    uses CPAP  . Stroke [facility]) 1987   Right brain stroke  . Wears glasses     Social History Social History   Tobacco Use  . Smoking status: Former Smoker    Packs/day: 2.00    Years: 40.00    Pack years: 80.00    Types: Cigarettes    Quit date: 03/26/2009    Years since quitting: 10.3  . Smokeless tobacco: Never Used  Substance Use Topics  . Alcohol use: No  . Drug use: No    Family History Family History  Problem Relation Age of Onset  . Cancer Mother 51       pancreatic  . Hyperlipidemia Mother   . Stroke Father 85  . Deep vein thrombosis Father   . Hyperlipidemia Father   . Hypertension Father     Surgical History Past Surgical History:  Procedure Laterality Date  . CARDIAC CATHETERIZATION N/A 05/17/2015   Procedure: Left Heart Cath and Coronary Angiography;  Surgeon: Belva Crome, MD;  Location: [place] CV [facility];  Service: Cardiovascular;  Laterality: N/A;  . CAROTID ENDARTERECTOMY  1994 & redo 2001   Left  . COLONOSCOPY W/ BIOPSIES AND POLYPECTOMY    . CORONARY ARTERY BYPASS GRAFT N/A 05/23/2015   Procedure: CORONARY ARTERY BYPASS GRAFTING (CABG) x 3 (LIMA to LAD, SVG to DIAGONAL 2, SVG to PDA) with Endoscopic Vein Harvesting from right greater saphenous vein;  Surgeon: Grace Isaac, MD;  Location: [place];  Service: Open Heart Surgery;  Laterality: N/A;  . FRACTURE SURGERY  2013   Right   foo  t X's 2  . ILIAC ARTERY STENT    . LAPAROSCOPIC CHOLECYSTECTOMY  09/11/2016  . POSTERIOR LUMBAR FUSION  Aug. 14, 2014   Level 1  . PR VEIN BYPASS  GRAFT,AORTO-FEM-POP  1998  . [place] SURGERY  2014  . TEE WITHOUT CARDIOVERSION N/A 05/23/2015   Procedure: TRANSESOPHAGEAL ECHOCARDIOGRAM (TEE);  Surgeon: Grace Isaac, MD;  Location: [place];  Service: Open Heart Surgery;  Laterality: N/A;  . TONSILLECTOMY    . TRIGGER FINGER RELEASE Right 07/14/2017   Procedure: RIGHT LONG FINGER TRIGGER RELEASE;  Surgeon: Milly Jakob, MD;  Location: [place];  Service: Orthopedics;  Laterality: Right;    Allergies  Allergen Reactions  . Hydrocodone Nausea Only  . Oxycodone Nausea Only  . Penicillins Rash    Has patient had a PCN reaction causing immediate rash, facial/tongue/throat swelling, SOB or lightheadedness with hypotension: NO Has patient had a PCN reaction causing severe rash involving mucus membranes or skin necrosis:NO Has patient had a PCN reaction that required hospitalization NO Has patient had a PCN reaction occurring within the last 10 years: NO If all of the above answers are "NO", then may proceed with Cephalosporin use.   . Sulfa Drugs Cross Reactors Rash    Current Outpatient Medications  Medication Sig Dispense Refill  . aspirin EC 81 MG EC tablet Take 1 tablet (81 mg total) by mouth daily.    Marland Kitchen atorvastatin (LIPITOR) 40 MG tablet Take 40 mg by mouth daily.      . clopidogrel (PLAVIX) 75 MG tablet Take 1 tablet (75 mg total) by mouth daily. 1 tablet 0  . dexlansoprazole (DEXILANT) 60 MG capsule Take 60 mg by mouth every other day.     Marland Kitchen FIASP FLEXTOUCH 100 UNIT/ML SOPN INJECT 20 TO 25 UNITS AT MEALS THREE TIMES A DAY UP. TO 60 UNITS PER DAY    . furosemide (LASIX) 20 MG tablet TK 1 T PO QAM    . gabapentin (NEURONTIN) 300 MG capsule TK 1-2 CS PO QD TO BID    . hydrochlorothiazide (HYDRODIURIL) 12.5 MG tablet TK 1 T PO QD IN THE MORNING    . ketoconazole (NIZORAL) 2 % shampoo USE ON NECK AND FACE AS WASH. LEAVE ON 5 MINUTES. RINSE WELL USE 2-3 TIMES WEEKLY    . losartan (COZAAR) 50 MG tablet TK 1 T PO QD.  TK WITH HCTZ D    . metoprolol tartrate (LOPRESSOR) 50 MG tablet Take 1 tablet (50 mg total) by mouth 2 (two) times daily. Please call [phone] for appointment for more refills thanks. FINAL attempt 30 tablet 0  . testosterone cypionate (DEPOTESTOSTERONE CYPIONATE) 200 MG/ML injection INJECT 0.6MLS IM Q WK    . TRESIBA FLEXTOUCH 100 UNIT/ML SOPN FlexTouch Pen INJECT 24 UNITS ONCE DAILY    . L-Methylfolate-Algae-B12-B6 (FOLTANX RF) 3-90.314-2-35 MG CAPS TK ONE C PO BID    . losartan-hydrochlorothiazide (HYZAAR) 50-12.5 MG tablet TAKE 1 TABLET BY MOUTH EVERY DAY (Patient not taking: Reported on 07/18/2019) 90 tablet 2   No current facility-administered medications for this visit.      REVIEW OF SYSTEMS: See HPI for pertinent positives and negatives.  Physical Examination Vitals:   07/18/19 1307 07/18/19 1317  BP: (!) 154/87 (!) 157/81  Pulse: 76 76  Resp: 16   Temp: 97.6 F (36.4 C)   TempSrc: Temporal   SpO2: 98%   Weight: 224 lb (101.6 kg)   Height: 5\' 11"  (1.803 m)    Body mass index is 31.24 kg/m.  General:  WD obese male in NAD Gait: Normal HENT: large neck Eyes: Pupils equal and round Pulmonary: normal non-labored breathing, adequate air movement in all fields, CTAB, no rales, rhonchi, or wheezes Cardiac: RRR, no murmur detected Abdomen: soft, NT, no masses palpated Skin: no rashes, no ulcers, no cellulitis.   VASCULAR EXAM  Carotid Bruits Right Left   Negative Negative      Radial pulses are 2+ palpable bilaterally   Adominal aortic pulse is not palpable                      VASCULAR EXAM: Extremities without ischemic changes, without Gangrene;  without open wounds.                                                                                                          LE Pulses Right Left       FEMORAL  2+ palpable  2+ palpable        POPLITEAL  not palpable   not palpable       POSTERIOR TIBIAL  not palpable   not palpable        DORSALIS PEDIS       ANTERIOR TIBIAL not palpable  not palpable     Musculoskeletal: no muscle wasting or atroph, trace bilateral ankle edema. Moderate cervical/thoracic kyphosis.  Neurologic:  A&O X 3; appropriate affect, sensation is normal; speech is normal, CN 2-12 intact, pain and light touch intact in extremities, motor exam as listed above. Psychiatric: Normal thought content, mood appropriate to clinical situation.    DATA  Carotid Duplex (07-18-19); Right Carotid: Velocities in the right ICA are consistent with a 40-59%                stenosis. Non-hemodynamically significant plaque <50% noted in                the CCA. The ECA appears >50% stenosed. Left Carotid: Velocities in the left ICA are consistent with a 1-39% stenosis.               Thrombus is noted in the left left internal jugular vein,               echogenicity suggests chronic finding, however, this was not               noted on previous carotid duplex exam of 09/30/2018. Patient denies               pacemaker or recent intervention regarding the left               extracranial/UE. Vertebrals:  Bilateral vertebral arteries demonstrate antegrade flow. Subclavians: Normal flow hemodynamics were seen in bilateral subclavian arteries. Slight increased stenosis in the left ICA, stable in the right ICA, compared to the exam on 09-12-18.    ABI Findings (07-18-19): +---------+------------------+-----+----------+--------+ Right    Rt Pressure (mmHg)IndexWaveform  Comment  +---------+------------------+-----+----------+--------+ Brachial 138                                       +---------+------------------+-----+----------+--------+ PTA      88                0.64 monophasic         +---------+------------------+-----+----------+--------+ DP       86                0.62 biphasic           +---------+------------------+-----+----------+--------+ Great Toe82                0.59 Abnormal            +---------+------------------+-----+----------+--------+  +---------+------------------+-----+----------+-------+  Left     Lt Pressure (mmHg)IndexWaveform  Comment +---------+------------------+-----+----------+-------+ Brachial 136                                      +---------+------------------+-----+----------+-------+ PTA      140               1.01 monophasic        +---------+------------------+-----+----------+-------+ DP       102               0.74 monophasic        +---------+------------------+-----+----------+-------+ Great Toe82                0.59                   +---------+------------------+-----+----------+-------+  +-------+-----------+-----------+------------+------------+ ABI/TBIToday's ABIToday's TBIPrevious ABIPrevious TBI +-------+-----------+-----------+------------+------------+ Right  0.64       0.59       0.73        0.68         +-------+-----------+-----------+------------+------------+ Left   1.0        0.59       0.81        0.67         +-------+-----------+-----------+------------+------------+ Right ABIs and TBIs appear essentially unchanged compared to prior study on 09/30/18. Left ABIs appear increased compared to prior study on 09/30/18.   Summary: Right: Resting right ankle-brachial index indicates moderate right lower extremity arterial disease. The right toe-brachial index is abnormal.  Left: Resting left ankle-brachial index is within normal range. No evidence of significant left lower extremity arterial disease. The left toe-brachial index is abnormal. Although ankle brachial indices are within normal limits (0.95-1.29), arterial Doppler waveforms at the ankle suggest some component of arterial occlusive disease.   Carotid Duplex (09/11/17): 40-59% right ICA stenosisandleft carotid endarterectomy site with<40% stenosis at the proximal patch site. Right vertebral artery is antegrade,  left vertebral artery is not visualized.  Bilateral subclavian arteries are normal. No significant change in comparison to the examson 09-05-15 and 09-09-16.   ABI (Date: 09/11/2017):  R:  ? ABI: 0.66 (was 0.59 on 09-09-16),  ? PT: mono ? DP: mono ? TBI:  0.49 (was 0.43)  L:  ? ABI: 0.61 (was 0.67),  ? PT: mono ? DP: mono ? TBI: 0.53 (was 0.51) ? Stable bilateral ABI with moderate disease, all monophasic waveforms.    ASSESSMENT:  Bradley [name] is a 73 y.o. male who is s/p aortobifemoral bypass for lower extremity arterial occlusive disease in 1998. He underwent a left carotid endarterectomy in 1994 with a redo for recurrent stenosis in 2001. He had a stroke at age 39, then subsequent TIA's until his last TIA in 2001. He has calf claudication after walking 15-20 minutes, right more so than left, no non healing wounds.  I discussed with Dr. Trula Slade thrombus noted in the left left internal jugular vein, on carotid duplex today, echogenicity suggests chronic finding, however, this was noted on previous carotid duplex exam of 09/30/2018. Patient denies pacemaker or recent intervention regarding the left extracranial/UE.  Since the thrombus was noted on the carotid duplex in February 2020, it is chronic, pt is asymptomatic, no anticoagulation needed.   His atherosclerotic risk factors include DM that is almost in control, former smoker, and stage 4 CKD. He takes a daily statin, 81 mg ASA, and Plavix.    PLAN:  Based on today's exam and non-invasive vascular lab results, the patient will follow up in 1 year with the following tests: carotid duplex and ABI's.  I advised him to notify us if he develops non healing wounds in his feet/legs or his calves feel worse with walking. Continue graduated walking program and exercising with a trainer.    I discussed in depth with the patient the nature of atherosclerosis, and emphasized the importance of maximal medical management including  strict control of blood pressure, blood glucose, and lipid levels, obtaining regular exercise, and continued cessation of smoking.  The patient is aware that without maximal medical management the underlying atherosclerotic disease process will progress, limiting the benefit of any interventions.  The patient was given information about stroke prevention and what symptoms should prompt the patient to seek immediate medical care.  The patient was given information about PAD including signs, symptoms, treatment, what symptoms should prompt the patient to seek immediate medical care, and risk reduction measures to take.  Thank you for allowing [place] to participate in this patient's care.  Clemon Chambers, RN, MSN, FNP-C [facility] Office: [phone]  Clinic MD: Trula Slade 07/18/2019 1:26 PM

## 2019-07-19 DIAGNOSIS — M9902 Segmental and somatic dysfunction of thoracic region: Secondary | ICD-10-CM | POA: Diagnosis not present

## 2019-07-19 DIAGNOSIS — M5033 Other cervical disc degeneration, cervicothoracic region: Secondary | ICD-10-CM | POA: Diagnosis not present

## 2019-07-19 DIAGNOSIS — M5412 Radiculopathy, cervical region: Secondary | ICD-10-CM | POA: Diagnosis not present

## 2019-07-19 DIAGNOSIS — M9901 Segmental and somatic dysfunction of cervical region: Secondary | ICD-10-CM | POA: Diagnosis not present

## 2019-08-01 DIAGNOSIS — M5033 Other cervical disc degeneration, cervicothoracic region: Secondary | ICD-10-CM | POA: Diagnosis not present

## 2019-08-01 DIAGNOSIS — M9901 Segmental and somatic dysfunction of cervical region: Secondary | ICD-10-CM | POA: Diagnosis not present

## 2019-08-01 DIAGNOSIS — M9902 Segmental and somatic dysfunction of thoracic region: Secondary | ICD-10-CM | POA: Diagnosis not present

## 2019-08-01 DIAGNOSIS — M5412 Radiculopathy, cervical region: Secondary | ICD-10-CM | POA: Diagnosis not present

## 2019-08-02 NOTE — Progress Notes (Addendum)
Cardiology Office Note:    Date:  08/03/2019   ID:  TABB CROGHAN, DOB 1945-11-22, MRN [id number]  PCP:  [name], MD  Cardiologist:  Sinclair Grooms, MD   Referring MD: [name], MD   Chief Complaint  Patient presents with  . Coronary Artery Disease    History of Present Illness:    Bradley [name] is a 73 y.o. male with a hx of CAD s/p CABG x3V2016, HTN, HLD, OSA, PAD with L CEA x2 and aorto-bifem bypass, OSA on CPAP,and DMT2 who was seen most recently for evaluation of chest pain and SOB.  Since of the Covid pandemic, the patient has gained weight, had lower extremity swelling, and is notices dyspnea on exertion.  He denies chest discomfort.  He has consciously reverted his diet back to pre-Covid eating which includes less fast foods and carbohydrates.  Some of the weight has been lost.  He was noticing lower extremity swelling and shortness of breath.  He saw his primary physician and Dr. Arty Baumgartner (nephrology) and was started on furosemide.  He states initially he took 40 mg but could not tolerate it because of stomach upset and dizziness.  He is now on 20 mg of furosemide along with hydrochlorothiazide 12.5 mg/day, losartan 50 mg/day,.  Blood pressure has been not as well controlled as is needed.  Even though he has lost some weight and swelling has improved, he does not feel back to normal.  With exertional dyspnea he is not having chest tightness, arm discomfort, or other symptoms to suggest ischemia.  Past Medical History:  Diagnosis Date  . Anemia   . Arthritis   . Carotid artery occlusion    a. Carotid US 10/16: RICA 60-79%, L CEA patent with 1-39% stenosis  . Coronary artery disease    a. Myoview 9/16:  EF 52%, inferior fixed defect consistent with diaphragmatic attenuation, intermediate risk secondary to poor exercise tolerance and symptoms during stress;  b. LHC 05/17/2015 90% mid LAD, 80% ost D2, 75% mid RCA, 35% prox RCA. >> S/p CABG  .  Diabetes mellitus age 73  . Diverticulitis   . GERD (gastroesophageal reflux disease)   . H/O hiatal hernia   . History of echocardiogram    a. Echo 9/16: GLS -15.2%, EF 78-93%, grade 1 diastolic dysfunction, normal wall motion, aortic sclerosis, dilated aortic root 39 mm, atrial septal lipomatous hypertrophy  . Hyperlipidemia   . Hypertension   . Irritable bowel syndrome (IBS)   . Myocardial infarction [facility])    silent inferior MI; patient denies MI history (03/17/13)   . Neuropathy 2013  . Osteoporosis 2013  . PAF (paroxysmal atrial fibrillation) ([place])    post CABG; Amiodarone stopped 2/2 wheezing;   . Peripheral vascular disease ([place])   . Reflux esophagitis   . Sleep apnea    uses CPAP  . Stroke [facility]) 1987   Right brain stroke  . Wears glasses     Past Surgical History:  Procedure Laterality Date  . CARDIAC CATHETERIZATION N/A 05/17/2015   Procedure: Left Heart Cath and Coronary Angiography;  Surgeon: Belva Crome, MD;  Location: [place] [facility];  Service: Cardiovascular;  Laterality: N/A;  . CAROTID ENDARTERECTOMY  1994 & redo 2001   Left  . COLONOSCOPY W/ BIOPSIES AND POLYPECTOMY    . CORONARY ARTERY BYPASS GRAFT N/A 05/23/2015   Procedure: CORONARY ARTERY BYPASS GRAFTING (CABG) x 3 (LIMA to LAD, SVG to DIAGONAL 2, SVG to PDA)  with Endoscopic Vein Harvesting from right greater saphenous vein;  Surgeon: Grace Isaac, MD;  Location: [place];  Service: Open Heart Surgery;  Laterality: N/A;  . FRACTURE SURGERY  2013   Right   foo  t X's 2  . ILIAC ARTERY STENT    . LAPAROSCOPIC CHOLECYSTECTOMY  09/11/2016  . POSTERIOR LUMBAR FUSION  Aug. 14, 2014   Level 1  . PR VEIN BYPASS GRAFT,AORTO-FEM-POP  1998  . [place] SURGERY  2014  . TEE WITHOUT CARDIOVERSION N/A 05/23/2015   Procedure: TRANSESOPHAGEAL ECHOCARDIOGRAM (TEE);  Surgeon: Grace Isaac, MD;  Location: [place];  Service: Open Heart Surgery;  Laterality: N/A;  . TONSILLECTOMY    . TRIGGER FINGER RELEASE Right  07/14/2017   Procedure: RIGHT LONG FINGER TRIGGER RELEASE;  Surgeon: Milly Jakob, MD;  Location: [place];  Service: Orthopedics;  Laterality: Right;    Current Medications: Current Meds  Medication Sig  . aspirin EC 81 MG EC tablet Take 1 tablet (81 mg total) by mouth daily.  Marland Kitchen atorvastatin (LIPITOR) 40 MG tablet Take 40 mg by mouth daily.    . clopidogrel (PLAVIX) 75 MG tablet Take 1 tablet (75 mg total) by mouth daily.  Marland Kitchen dexlansoprazole (DEXILANT) 60 MG capsule Take 60 mg by mouth every other day.   Marland Kitchen FIASP FLEXTOUCH 100 UNIT/ML SOPN INJECT 20 TO 25 UNITS AT MEALS THREE TIMES A DAY UP. TO 60 UNITS PER DAY  . gabapentin (NEURONTIN) 300 MG capsule TK 1-2 CS PO QD TO BID  . ketoconazole (NIZORAL) 2 % shampoo USE ON NECK AND FACE AS WASH. LEAVE ON 5 MINUTES. RINSE WELL USE 2-3 TIMES WEEKLY  . L-Methylfolate-Algae-B12-B6 (FOLTANX RF) 3-90.314-2-35 MG CAPS TK ONE C PO BID  . losartan (COZAAR) 50 MG tablet TK 1 T PO QD. TK WITH HCTZ D  . metoprolol tartrate (LOPRESSOR) 50 MG tablet Take 1 tablet (50 mg total) by mouth 2 (two) times daily. Please call [phone] for appointment for more refills thanks. FINAL attempt  . testosterone cypionate (DEPOTESTOSTERONE CYPIONATE) 200 MG/ML injection INJECT 0.6MLS IM Q WK  . TRESIBA FLEXTOUCH 100 UNIT/ML SOPN FlexTouch Pen INJECT 24 UNITS ONCE DAILY  . [DISCONTINUED] furosemide (LASIX) 20 MG tablet TK 1 T PO QAM  . [DISCONTINUED] hydrochlorothiazide (HYDRODIURIL) 12.5 MG tablet TK 1 T PO QD IN THE MORNING     Allergies:   Hydrocodone, Oxycodone, Penicillins, and Sulfa drugs cross reactors   Social History   Socioeconomic History  . Marital status: Married    Spouse name: Not on file  . Number of children: Not on file  . Years of education: Not on file  . Highest education level: Not on file  Occupational History  . Not on file  Tobacco Use  . Smoking status: Former Smoker    Packs/day: 2.00    Years: 40.00    Pack years:  80.00    Types: Cigarettes    Quit date: 03/26/2009    Years since quitting: 10.3  . Smokeless tobacco: Never Used  Substance and Sexual Activity  . Alcohol use: No  . Drug use: No  . Sexual activity: Not on file  Other Topics Concern  . Not on file  Social History Narrative  . Not on file   Social Determinants of Health   Financial Resource Strain:   . Difficulty of Paying Living Expenses: Not on file  Food Insecurity:   . Worried About Charity fundraiser in the Last  Year: Not on file  . Ran Out of Food in the Last Year: Not on file  Transportation Needs:   . Lack of Transportation (Medical): Not on file  . Lack of Transportation (Non-Medical): Not on file  Physical Activity:   . Days of Exercise per Week: Not on file  . Minutes of Exercise per Session: Not on file  Stress:   . Feeling of Stress : Not on file  Social Connections:   . Frequency of Communication with Friends and Family: Not on file  . Frequency of Social Gatherings with Friends and Family: Not on file  . Attends Religious Services: Not on file  . Active Member of Clubs or Organizations: Not on file  . Attends Archivist Meetings: Not on file  . Marital Status: Not on file     Family History: The patient's family history includes Cancer (age of onset: 65) in his mother; Deep vein thrombosis in his father; Hyperlipidemia in his father and mother; Hypertension in his father; Stroke (age of onset: 62) in his father.  ROS:   Please see the history of present illness.    Nausea and diarrhea on furosemide 40 mg/day.  On 20 mg/day, he does not urinate very much.  He does have some difficulty with claudication in his right greater than left leg.  This is not limiting his activity.  He is concerned about his rising creatinine.  All other systems reviewed and are negative.  EKGs/Labs/Other Studies Reviewed:    The following studies were reviewed today: No new data  EKG:  EKG normal sinus rhythm, left  axis deviation, left atrial abnormality, and early QRS transition.  Recent Labs: No results found for requested labs within last 8760 hours.  Recent Lipid Panel    Component Value Date/Time   CHOL 82 06/01/2015 0315   TRIG 158 (H) 06/01/2015 0315   HDL 24 (L) 06/01/2015 0315   CHOLHDL 3.4 06/01/2015 0315   VLDL 32 06/01/2015 0315   LDLCALC 26 06/01/2015 0315    Physical Exam:    VS:  BP (!) 148/84   Pulse 76   Ht 5\' 11"  (1.803 m)   Wt 222 lb 12.8 oz (101.1 kg)   SpO2 97%   BMI 31.07 kg/m     Wt Readings from Last 3 Encounters:  08/03/19 222 lb 12.8 oz (101.1 kg)  07/18/19 224 lb (101.6 kg)  09/30/18 226 lb 10.1 oz (102.8 kg)     GEN: Abdominal obesity. No acute distress HEENT: Normal NECK: No JVD. LYMPHATICS: No lymphadenopathy CARDIAC:  RRR without murmur, gallop, but there is trace bilateral ankle and shin edema. VASCULAR:  Normal Pulses. No bruits. RESPIRATORY:  Clear to auscultation without rales, wheezing or rhonchi  ABDOMEN: Soft, non-tender, non-distended, No pulsatile mass, MUSCULOSKELETAL: No deformity  SKIN: Warm and dry NEUROLOGIC:  Alert and oriented x 3 PSYCHIATRIC:  Normal affect   ASSESSMENT:    1. Coronary artery disease involving coronary bypass graft of native heart with angina pectoris ([place])   2. Congestive heart failure, unspecified HF chronicity, unspecified heart failure type ([place])   3. Hyperlipidemia, unspecified hyperlipidemia type   4. Right bundle branch block   5. Essential hypertension   6. PAF (paroxysmal atrial fibrillation) ([place])   7. Type 2 diabetes mellitus with vascular disease ([place])   8. PAOD (peripheral arterial occlusive disease) ([place])   9. OSA on CPAP   10. Educated about COVID-19 virus infection    PLAN:  In order of problems listed above:  1. Current symptoms could be an anginal equivalent. 2. 2D Doppler echocardiogram will be done to assess LV function, regional wall motion, RV, and to rule out pericardial  disease. 3. LDL was at target in October at 55. 4. EKG appearance is not significantly different. 5. Elevated.  We will switch from furosemide 20 mg/day to torsemide 20 mg/day.  Basic metabolic panel in 1 week.  Would hold that additional diuresis will improve blood pressure and dyspnea. 6. No evidence of atrial fibrillation currently. 7. Not discussed. 8. Stable. 9. Compliant with CPAP. 10. 3W's practiced to avoid COVID-19 infection.  Return in 3 to 4 weeks for follow-up.  Call if he has any difficulty with torsemide.  Bmet in 1 week.  May need to be off ARB therapy.  A BNP will be done at that time as well to further assess shortness of breath.   Medication Adjustments/Labs and Tests Ordered: Current medicines are reviewed at length with the patient today.  Concerns regarding medicines are outlined above.  Orders Placed This Encounter  Procedures  . Basic metabolic panel  . Pro b natriuretic peptide  . EKG 12-Lead  . ECHOCARDIOGRAM COMPLETE   Meds ordered this encounter  Medications  . torsemide (DEMADEX) 20 MG tablet    Sig: Take 1 tablet (20 mg total) by mouth daily.    Dispense:  90 tablet    Refill:  3    Patient Instructions  Medication Instructions:  1) DISCONTINUE Furosemide 2) DISCONTINUE HCTZ 3) START Torsemide 20mg  once daily  *If you need a refill on your cardiac medications before your next appointment, please call your pharmacy*  Lab Work: Your physician recommends that you return for lab work in: 1 week (BMET, Pro BNP)  If you have labs (blood work) drawn today and your tests are completely normal, you will receive your results only by: Marland Kitchen MyChart Message (if you have MyChart) OR . A paper copy in the mail If you have any lab test that is abnormal or we need to change your treatment, we will call you to review the results.  Testing/Procedures: Your physician has requested that you have an echocardiogram. Echocardiography is a painless test that uses sound  waves to create images of your heart. It provides your doctor with information about the size and shape of your heart and how well your heart's chambers and valves are working. This procedure takes approximately one hour. There are no restrictions for this procedure.   Follow-Up: At [facility], you and your health needs are our priority.  As part of our continuing mission to provide you with exceptional heart care, we have created designated Provider Care Teams.  These Care Teams include your primary Cardiologist (physician) and Advanced Practice Providers (APPs -  Physician Assistants and Nurse Practitioners) who all work together to provide you with the care you need, when you need it.  Your next appointment:   In January (Can have 12pm on 1/4, 1/7 or 1/12 (just need echo prior).  The format for your next appointment:   In Person  Provider:   You may see Sinclair Grooms, MD or one of the following Advanced Practice Providers on your designated Care Team:    Truitt Merle, NP  Cecilie Kicks, NP  Kathyrn Drown, NP   Other Instructions      Signed, Sinclair Grooms, MD  08/03/2019 2:49 PM    [place]

## 2019-08-03 ENCOUNTER — Encounter: Payer: Self-pay | Admitting: Interventional Cardiology

## 2019-08-03 ENCOUNTER — Ambulatory Visit: Payer: PPO | Admitting: Interventional Cardiology

## 2019-08-03 ENCOUNTER — Other Ambulatory Visit: Payer: Self-pay

## 2019-08-03 VITALS — BP 148/84 | HR 76 | Ht 71.0 in | Wt 222.8 lb

## 2019-08-03 DIAGNOSIS — I779 Disorder of arteries and arterioles, unspecified: Secondary | ICD-10-CM

## 2019-08-03 DIAGNOSIS — E785 Hyperlipidemia, unspecified: Secondary | ICD-10-CM | POA: Diagnosis not present

## 2019-08-03 DIAGNOSIS — Z7189 Other specified counseling: Secondary | ICD-10-CM | POA: Diagnosis not present

## 2019-08-03 DIAGNOSIS — G4733 Obstructive sleep apnea (adult) (pediatric): Secondary | ICD-10-CM

## 2019-08-03 DIAGNOSIS — I10 Essential (primary) hypertension: Secondary | ICD-10-CM | POA: Diagnosis not present

## 2019-08-03 DIAGNOSIS — Z9989 Dependence on other enabling machines and devices: Secondary | ICD-10-CM | POA: Diagnosis not present

## 2019-08-03 DIAGNOSIS — E1159 Type 2 diabetes mellitus with other circulatory complications: Secondary | ICD-10-CM | POA: Diagnosis not present

## 2019-08-03 DIAGNOSIS — I4510 Unspecified right bundle-branch block: Secondary | ICD-10-CM

## 2019-08-03 DIAGNOSIS — I509 Heart failure, unspecified: Secondary | ICD-10-CM

## 2019-08-03 DIAGNOSIS — I25709 Atherosclerosis of coronary artery bypass graft(s), unspecified, with unspecified angina pectoris: Secondary | ICD-10-CM | POA: Diagnosis not present

## 2019-08-03 DIAGNOSIS — I480 Paroxysmal atrial fibrillation: Secondary | ICD-10-CM

## 2019-08-03 MED ORDER — TORSEMIDE 20 MG PO TABS: 20.0000 mg | ORAL_TABLET | Freq: Every day | ORAL | 3 refills | Status: DC

## 2019-08-03 NOTE — Patient Instructions (Addendum)
Medication Instructions:  1) DISCONTINUE Furosemide 2) DISCONTINUE HCTZ 3) START Torsemide 20mg  once daily  *If you need a refill on your cardiac medications before your next appointment, please call your pharmacy*  Lab Work: Your physician recommends that you return for lab work in: 1 week (BMET, Pro BNP)  If you have labs (blood work) drawn today and your tests are completely normal, you will receive your results only by: Marland Kitchen MyChart Message (if you have MyChart) OR . A paper copy in the mail If you have any lab test that is abnormal or we need to change your treatment, we will call you to review the results.  Testing/Procedures: Your physician has requested that you have an echocardiogram. Echocardiography is a painless test that uses sound waves to create images of your heart. It provides your doctor with information about the size and shape of your heart and how well your heart's chambers and valves are working. This procedure takes approximately one hour. There are no restrictions for this procedure.   Follow-Up: At [facility], you and your health needs are our priority.  As part of our continuing mission to provide you with exceptional heart care, we have created designated Provider Care Teams.  These Care Teams include your primary Cardiologist (physician) and Advanced Practice Providers (APPs -  Physician Assistants and Nurse Practitioners) who all work together to provide you with the care you need, when you need it.  Your next appointment:   In January (Can have 12pm on 1/4, 1/7 or 1/12 (just need echo prior).  The format for your next appointment:   In Person  Provider:   You may see Sinclair Grooms, MD or one of the following Advanced Practice Providers on your designated Care Team:    Truitt Merle, NP  Cecilie Kicks, NP  Kathyrn Drown, NP   Other Instructions

## 2019-08-08 ENCOUNTER — Other Ambulatory Visit: Payer: Self-pay | Admitting: *Deleted

## 2019-08-08 DIAGNOSIS — E1165 Type 2 diabetes mellitus with hyperglycemia: Secondary | ICD-10-CM | POA: Diagnosis not present

## 2019-08-08 DIAGNOSIS — I6523 Occlusion and stenosis of bilateral carotid arteries: Secondary | ICD-10-CM

## 2019-08-08 DIAGNOSIS — I779 Disorder of arteries and arterioles, unspecified: Secondary | ICD-10-CM

## 2019-08-16 ENCOUNTER — Ambulatory Visit (HOSPITAL_COMMUNITY): Payer: PPO | Attending: Cardiology

## 2019-08-16 ENCOUNTER — Other Ambulatory Visit: Payer: PPO

## 2019-08-16 ENCOUNTER — Other Ambulatory Visit: Payer: Self-pay

## 2019-08-16 DIAGNOSIS — I10 Essential (primary) hypertension: Secondary | ICD-10-CM | POA: Diagnosis not present

## 2019-08-16 DIAGNOSIS — I25709 Atherosclerosis of coronary artery bypass graft(s), unspecified, with unspecified angina pectoris: Secondary | ICD-10-CM

## 2019-08-16 DIAGNOSIS — I509 Heart failure, unspecified: Secondary | ICD-10-CM

## 2019-08-16 MED ORDER — PERFLUTREN LIPID MICROSPHERE
1.0000 mL | INTRAVENOUS | Status: AC | PRN
  Administered 2019-08-16: 2 mL via INTRAVENOUS

## 2019-08-17 LAB — BASIC METABOLIC PANEL
BUN/Creatinine Ratio: 14 (ref 10–24)
BUN: 33 mg/dL — ABNORMAL HIGH (ref 8–27)
CO2: 28 mmol/L (ref 20–29)
Calcium: 9.7 mg/dL (ref 8.6–10.2)
Chloride: 98 mmol/L (ref 96–106)
Creatinine, Ser: 2.38 mg/dL — ABNORMAL HIGH (ref 0.76–1.27)
GFR calc Af Amer: 30 mL/min/{1.73_m2} — ABNORMAL LOW (ref >59)
GFR calc non Af Amer: 26 mL/min/{1.73_m2} — ABNORMAL LOW (ref >59)
Glucose: 117 mg/dL — ABNORMAL HIGH (ref 65–99)
Potassium: 4.9 mmol/L (ref 3.5–5.2)
Sodium: 140 mmol/L (ref 134–144)

## 2019-08-17 LAB — PRO B NATRIURETIC PEPTIDE: NT-Pro BNP: 143 pg/mL (ref 0–486)

## 2019-08-18 DIAGNOSIS — M5412 Radiculopathy, cervical region: Secondary | ICD-10-CM | POA: Diagnosis not present

## 2019-08-18 DIAGNOSIS — M9901 Segmental and somatic dysfunction of cervical region: Secondary | ICD-10-CM | POA: Diagnosis not present

## 2019-08-18 DIAGNOSIS — M5033 Other cervical disc degeneration, cervicothoracic region: Secondary | ICD-10-CM | POA: Diagnosis not present

## 2019-08-18 DIAGNOSIS — M9902 Segmental and somatic dysfunction of thoracic region: Secondary | ICD-10-CM | POA: Diagnosis not present

## 2019-08-23 ENCOUNTER — Ambulatory Visit: Payer: PPO | Admitting: Interventional Cardiology

## 2019-08-23 ENCOUNTER — Encounter: Payer: Self-pay | Admitting: Interventional Cardiology

## 2019-08-23 ENCOUNTER — Other Ambulatory Visit: Payer: Self-pay

## 2019-08-23 VITALS — BP 136/74 | HR 76 | Ht 71.0 in | Wt 224.8 lb

## 2019-08-23 DIAGNOSIS — Z95828 Presence of other vascular implants and grafts: Secondary | ICD-10-CM

## 2019-08-23 DIAGNOSIS — Z7189 Other specified counseling: Secondary | ICD-10-CM | POA: Diagnosis not present

## 2019-08-23 DIAGNOSIS — I509 Heart failure, unspecified: Secondary | ICD-10-CM | POA: Diagnosis not present

## 2019-08-23 DIAGNOSIS — I4510 Unspecified right bundle-branch block: Secondary | ICD-10-CM | POA: Diagnosis not present

## 2019-08-23 DIAGNOSIS — E1159 Type 2 diabetes mellitus with other circulatory complications: Secondary | ICD-10-CM | POA: Diagnosis not present

## 2019-08-23 DIAGNOSIS — N184 Chronic kidney disease, stage 4 (severe): Secondary | ICD-10-CM

## 2019-08-23 DIAGNOSIS — E785 Hyperlipidemia, unspecified: Secondary | ICD-10-CM

## 2019-08-23 DIAGNOSIS — I10 Essential (primary) hypertension: Secondary | ICD-10-CM

## 2019-08-23 DIAGNOSIS — I25709 Atherosclerosis of coronary artery bypass graft(s), unspecified, with unspecified angina pectoris: Secondary | ICD-10-CM | POA: Diagnosis not present

## 2019-08-23 NOTE — Patient Instructions (Signed)
Medication Instructions:  Your physician recommends that you continue on your current medications as directed. Please refer to the Current Medication list given to you today.  *If you need a refill on your cardiac medications before your next appointment, please call your pharmacy*  Lab Work: None If you have labs (blood work) drawn today and your tests are completely normal, you will receive your results only by: Marland Kitchen MyChart Message (if you have MyChart) OR . A paper copy in the mail If you have any lab test that is abnormal or we need to change your treatment, we will call you to review the results.  Testing/Procedures: None  Follow-Up: At [facility], you and your health needs are our priority.  As part of our continuing mission to provide you with exceptional heart care, we have created designated Provider Care Teams.  These Care Teams include your primary Cardiologist (physician) and Advanced Practice Providers (APPs -  Physician Assistants and Nurse Practitioners) who all work together to provide you with the care you need, when you need it.  Your next appointment:   6-9 month(s)  The format for your next appointment:   In Person  Provider:   You may see Sinclair Grooms, MD or one of the following Advanced Practice Providers on your designated Care Team:    Truitt Merle, NP  Cecilie Kicks, NP  Kathyrn Drown, NP   Other Instructions

## 2019-08-23 NOTE — Progress Notes (Signed)
Cardiology Office Note:    Date:  08/23/2019   ID:  Bradley [name], DOB Jan 04, 1946, MRN [id number]  PCP:  [name], MD  Cardiologist:  Sinclair Grooms, MD   Referring MD: [name], MD   Chief Complaint  Patient presents with  . Coronary Artery Disease  . Congestive Heart Failure  . Advice Only    CKD    History of Present Illness:    Bradley [name] is a 74 y.o. male with a hx of CAD s/p CABG x3V2016, chronic combined systolic and diastolic heart failure, HTN, HLD, OSA, PAD with L CEA x2 and aorto-bifem bypass, OSA on CPAP,and DMT2 who was seen most recentlyfor evaluation of chest pain and SOB.  He states that diuretic therapy change led to increased diuresis.  This led to decreased lower extremity swelling and improvement in breathing.  The echo done relatively recently demonstrates normal systolic function and therefore heart failure was diastolic.  Past Medical History:  Diagnosis Date  . Anemia   . Arthritis   . Carotid artery occlusion    a. Carotid US 10/16: RICA 60-79%, L CEA patent with 1-39% stenosis  . Coronary artery disease    a. Myoview 9/16:  EF 52%, inferior fixed defect consistent with diaphragmatic attenuation, intermediate risk secondary to poor exercise tolerance and symptoms during stress;  b. LHC 05/17/2015 90% mid LAD, 80% ost D2, 75% mid RCA, 35% prox RCA. >> S/p CABG  . Diabetes mellitus age 38  . Diverticulitis   . GERD (gastroesophageal reflux disease)   . H/O hiatal hernia   . History of echocardiogram    a. Echo 9/16: GLS -15.2%, EF 40-10%, grade 1 diastolic dysfunction, normal wall motion, aortic sclerosis, dilated aortic root 39 mm, atrial septal lipomatous hypertrophy  . Hyperlipidemia   . Hypertension   . Irritable bowel syndrome (IBS)   . Myocardial infarction [facility])    silent inferior MI; patient denies MI history (03/17/13)   . Neuropathy 2013  . Osteoporosis 2013  . PAF (paroxysmal atrial fibrillation) ([place])    post CABG; Amiodarone stopped 2/2 wheezing;   . Peripheral vascular disease ([place])   . Reflux esophagitis   . Sleep apnea    uses CPAP  . Stroke [facility]) 1987   Right brain stroke  . Wears glasses     Past Surgical History:  Procedure Laterality Date  . CARDIAC CATHETERIZATION N/A 05/17/2015   Procedure: Left Heart Cath and Coronary Angiography;  Surgeon: Belva Crome, MD;  Location: [place] [facility];  Service: Cardiovascular;  Laterality: N/A;  . CAROTID ENDARTERECTOMY  1994 & redo 2001   Left  . COLONOSCOPY W/ BIOPSIES AND POLYPECTOMY    . CORONARY ARTERY BYPASS GRAFT N/A 05/23/2015   Procedure: CORONARY ARTERY BYPASS GRAFTING (CABG) x 3 (LIMA to LAD, SVG to DIAGONAL 2, SVG to PDA) with Endoscopic Vein Harvesting from right greater saphenous vein;  Surgeon: Grace Isaac, MD;  Location: [place];  Service: Open Heart Surgery;  Laterality: N/A;  . FRACTURE SURGERY  2013   Right   foo  t X's 2  . ILIAC ARTERY STENT    . LAPAROSCOPIC CHOLECYSTECTOMY  09/11/2016  . POSTERIOR LUMBAR FUSION  Aug. 14, 2014   Level 1  . PR VEIN BYPASS GRAFT,AORTO-FEM-POP  1998  . [place] SURGERY  2014  . TEE WITHOUT CARDIOVERSION N/A 05/23/2015   Procedure: TRANSESOPHAGEAL ECHOCARDIOGRAM (TEE);  Surgeon: Grace Isaac, MD;  Location: [place];  Service: Open Heart Surgery;  Laterality: N/A;  . TONSILLECTOMY    . TRIGGER FINGER RELEASE Right 07/14/2017   Procedure: RIGHT LONG FINGER TRIGGER RELEASE;  Surgeon: Milly Jakob, MD;  Location: [place];  Service: Orthopedics;  Laterality: Right;    Current Medications: Current Meds  Medication Sig  . aspirin EC 81 MG EC tablet Take 1 tablet (81 mg total) by mouth daily.  Marland Kitchen atorvastatin (LIPITOR) 40 MG tablet Take 40 mg by mouth daily.    . Cholecalciferol (VITAMIN D3 PO) Take 500 Units by mouth daily.  . clopidogrel (PLAVIX) 75 MG tablet Take 1 tablet (75 mg total) by mouth daily.  Marland Kitchen dexlansoprazole (DEXILANT) 60 MG capsule Take 60 mg by  mouth every other day.   Marland Kitchen FIASP FLEXTOUCH 100 UNIT/ML SOPN INJECT 20 TO 25 UNITS AT MEALS THREE TIMES A DAY UP. TO 60 UNITS PER DAY  . gabapentin (NEURONTIN) 300 MG capsule TK 1-2 CS PO QD TO BID  . ketoconazole (NIZORAL) 2 % shampoo USE ON NECK AND FACE AS WASH. LEAVE ON 5 MINUTES. RINSE WELL USE 2-3 TIMES WEEKLY  . L-Methylfolate-Algae-B12-B6 (FOLTANX RF) 3-90.314-2-35 MG CAPS TK ONE C PO BID  . losartan (COZAAR) 50 MG tablet TK 1 T PO QD. TK WITH HCTZ D  . metoprolol tartrate (LOPRESSOR) 50 MG tablet Take 1 tablet (50 mg total) by mouth 2 (two) times daily. Please call [phone] for appointment for more refills thanks. FINAL attempt  . testosterone cypionate (DEPOTESTOSTERONE CYPIONATE) 200 MG/ML injection INJECT 0.6MLS IM Q WK  . torsemide (DEMADEX) 20 MG tablet Take 1 tablet (20 mg total) by mouth daily.  . TRESIBA FLEXTOUCH 100 UNIT/ML SOPN FlexTouch Pen INJECT 24 UNITS ONCE DAILY     Allergies:   Hydrocodone, Oxycodone, Penicillins, and Sulfa drugs cross reactors   Social History   Socioeconomic History  . Marital status: Married    Spouse name: Not on file  . Number of children: Not on file  . Years of education: Not on file  . Highest education level: Not on file  Occupational History  . Not on file  Tobacco Use  . Smoking status: Former Smoker    Packs/day: 2.00    Years: 40.00    Pack years: 80.00    Types: Cigarettes    Quit date: 03/26/2009    Years since quitting: 10.4  . Smokeless tobacco: Never Used  Substance and Sexual Activity  . Alcohol use: No  . Drug use: No  . Sexual activity: Not on file  Other Topics Concern  . Not on file  Social History Narrative  . Not on file   Social Determinants of Health   Financial Resource Strain:   . Difficulty of Paying Living Expenses: Not on file  Food Insecurity:   . Worried About Charity fundraiser in the Last Year: Not on file  . Ran Out of Food in the Last Year: Not on file  Transportation Needs:   . Lack  of Transportation (Medical): Not on file  . Lack of Transportation (Non-Medical): Not on file  Physical Activity:   . Days of Exercise per Week: Not on file  . Minutes of Exercise per Session: Not on file  Stress:   . Feeling of Stress : Not on file  Social Connections:   . Frequency of Communication with Friends and Family: Not on file  . Frequency of Social Gatherings with Friends and Family: Not on file  . Attends Religious  Services: Not on file  . Active Member of Clubs or Organizations: Not on file  . Attends Archivist Meetings: Not on file  . Marital Status: Not on file     Family History: The patient's family history includes Cancer (age of onset: 75) in his mother; Deep vein thrombosis in his father; Hyperlipidemia in his father and mother; Hypertension in his father; Stroke (age of onset: 51) in his father.  ROS:   Please see the history of present illness.    Complains about the amount of urination he has to do since switching to torsemide.  States he is drinking a lot of fluid.  States that nephrology told him to do so.  All other systems reviewed and are negative.  EKGs/Labs/Other Studies Reviewed:    The following studies were reviewed today: 2D Doppler echocardiogram January 2021: IMPRESSIONS    1. Left ventricular ejection fraction, by visual estimation, is 60 to 65%. The left ventricle has normal function. There is no left ventricular hypertrophy.  2. Left ventricular diastolic parameters are consistent with Grade I diastolic dysfunction (impaired relaxation).  3. The left ventricle has no regional wall motion abnormalities.  4. Global right ventricle has normal systolic function.The right ventricular size is normal. No increase in right ventricular wall thickness.  5. Left atrial size was normal.  6. Right atrial size was normal.  7. The mitral valve is normal in structure. No evidence of mitral valve regurgitation. No evidence of mitral stenosis.   8. The tricuspid valve is normal in structure.  9. The aortic valve is normal in structure. Aortic valve regurgitation is not visualized. Mild to moderate aortic valve sclerosis/calcification without any evidence of aortic stenosis. 10. The pulmonic valve was normal in structure. Pulmonic valve regurgitation is not visualized. 11. The inferior vena cava is normal in size with greater than 50% respiratory variability, suggesting right atrial pressure of 3 mmHg. 12. The average left ventricular global longitudinal strain is -13.9 %.  EKG:  EKG no new data  Recent Labs: 08/16/2019: BUN 33; Creatinine, Ser 2.38; NT-Pro BNP 143; Potassium 4.9; Sodium 140  Recent Lipid Panel    Component Value Date/Time   CHOL 82 06/01/2015 0315   TRIG 158 (H) 06/01/2015 0315   HDL 24 (L) 06/01/2015 0315   CHOLHDL 3.4 06/01/2015 0315   VLDL 32 06/01/2015 0315   LDLCALC 26 06/01/2015 0315    Physical Exam:    VS:  BP 136/74   Pulse 76   Ht 5\' 11"  (1.803 m)   Wt 224 lb 12.8 oz (102 kg)   SpO2 95%   BMI 31.35 kg/m     Wt Readings from Last 3 Encounters:  08/23/19 224 lb 12.8 oz (102 kg)  08/03/19 222 lb 12.8 oz (101.1 kg)  07/18/19 224 lb (101.6 kg)     GEN: Abdominal obesity. No acute distress HEENT: Normal NECK: No JVD. LYMPHATICS: No lymphadenopathy CARDIAC: Soft 1/6 systolic murmur.  RRR without murmur, gallop, or edema. VASCULAR:  Normal Pulses. No bruits. RESPIRATORY:  Clear to auscultation without rales, wheezing or rhonchi  ABDOMEN: Soft, non-tender, non-distended, No pulsatile mass, MUSCULOSKELETAL: No deformity  SKIN: Warm and dry NEUROLOGIC:  Alert and oriented x 3 PSYCHIATRIC:  Normal affect   ASSESSMENT:    1. Coronary artery disease involving coronary bypass graft of native heart with angina pectoris ([place])   2. Congestive heart failure, unspecified HF chronicity, unspecified heart failure type ([place])   3. Hyperlipidemia, unspecified hyperlipidemia type  4. Right bundle branch  block   5. Essential hypertension   6. Type 2 diabetes mellitus with vascular disease ([place])   7. S/P aortobifemoral bypass surgery   8. CKD (chronic kidney disease) stage 4, GFR 15-29 ml/min ([name])   9. Educated about COVID-19 virus infection    PLAN:    In order of problems listed above:  1. Stable without angina 2. Diastolic heart failure improved after adjustment in diuretic regimen. 3. Lipid panel is adequate for prevention 4. Unchanged 5. Blood pressure is adequate 6. A1c was 7.1 7. Asymptomatic 8. CKD as his major significant comorbidity. 9. 3W's discussed and vaccine encouraged.  Overall education and awareness concerning primary/secondary risk prevention was discussed in detail: LDL less than 70, hemoglobin A1c less than 7, blood pressure target less than 130/80 mmHg, >150 minutes of moderate aerobic activity per week, avoidance of smoking, weight control (via diet and exercise), and continued surveillance/management of/for obstructive sleep apnea.    Medication Adjustments/Labs and Tests Ordered: Current medicines are reviewed at length with the patient today.  Concerns regarding medicines are outlined above.  No orders of the defined types were placed in this encounter.  No orders of the defined types were placed in this encounter.   Patient Instructions  Medication Instructions:  Your physician recommends that you continue on your current medications as directed. Please refer to the Current Medication list given to you today.  *If you need a refill on your cardiac medications before your next appointment, please call your pharmacy*  Lab Work: None If you have labs (blood work) drawn today and your tests are completely normal, you will receive your results only by: Marland Kitchen MyChart Message (if you have MyChart) OR . A paper copy in the mail If you have any lab test that is abnormal or we need to change your treatment, we will call you to review the  results.  Testing/Procedures: None  Follow-Up: At [facility], you and your health needs are our priority.  As part of our continuing mission to provide you with exceptional heart care, we have created designated Provider Care Teams.  These Care Teams include your primary Cardiologist (physician) and Advanced Practice Providers (APPs -  Physician Assistants and Nurse Practitioners) who all work together to provide you with the care you need, when you need it.  Your next appointment:   6-9 month(s)  The format for your next appointment:   In Person  Provider:   You may see Sinclair Grooms, MD or one of the following Advanced Practice Providers on your designated Care Team:    Truitt Merle, NP  Cecilie Kicks, NP  Kathyrn Drown, NP   Other Instructions      Signed, Sinclair Grooms, MD  08/23/2019 11:17 AM    [place]

## 2019-08-29 DIAGNOSIS — M9901 Segmental and somatic dysfunction of cervical region: Secondary | ICD-10-CM | POA: Diagnosis not present

## 2019-08-29 DIAGNOSIS — M5033 Other cervical disc degeneration, cervicothoracic region: Secondary | ICD-10-CM | POA: Diagnosis not present

## 2019-08-29 DIAGNOSIS — M9902 Segmental and somatic dysfunction of thoracic region: Secondary | ICD-10-CM | POA: Diagnosis not present

## 2019-08-29 DIAGNOSIS — M5412 Radiculopathy, cervical region: Secondary | ICD-10-CM | POA: Diagnosis not present

## 2019-09-08 DIAGNOSIS — E1165 Type 2 diabetes mellitus with hyperglycemia: Secondary | ICD-10-CM | POA: Diagnosis not present

## 2019-09-12 DIAGNOSIS — M9902 Segmental and somatic dysfunction of thoracic region: Secondary | ICD-10-CM | POA: Diagnosis not present

## 2019-09-12 DIAGNOSIS — M5412 Radiculopathy, cervical region: Secondary | ICD-10-CM | POA: Diagnosis not present

## 2019-09-12 DIAGNOSIS — M5033 Other cervical disc degeneration, cervicothoracic region: Secondary | ICD-10-CM | POA: Diagnosis not present

## 2019-09-12 DIAGNOSIS — M9901 Segmental and somatic dysfunction of cervical region: Secondary | ICD-10-CM | POA: Diagnosis not present

## 2019-09-22 DIAGNOSIS — N183 Chronic kidney disease, stage 3 unspecified: Secondary | ICD-10-CM | POA: Diagnosis not present

## 2019-09-22 DIAGNOSIS — G4733 Obstructive sleep apnea (adult) (pediatric): Secondary | ICD-10-CM | POA: Diagnosis not present

## 2019-09-22 DIAGNOSIS — I2510 Atherosclerotic heart disease of native coronary artery without angina pectoris: Secondary | ICD-10-CM | POA: Diagnosis not present

## 2019-09-22 DIAGNOSIS — N179 Acute kidney failure, unspecified: Secondary | ICD-10-CM | POA: Diagnosis not present

## 2019-09-22 DIAGNOSIS — R519 Headache, unspecified: Secondary | ICD-10-CM | POA: Diagnosis not present

## 2019-09-22 DIAGNOSIS — E785 Hyperlipidemia, unspecified: Secondary | ICD-10-CM | POA: Diagnosis not present

## 2019-09-22 DIAGNOSIS — I639 Cerebral infarction, unspecified: Secondary | ICD-10-CM | POA: Diagnosis not present

## 2019-09-22 DIAGNOSIS — E1122 Type 2 diabetes mellitus with diabetic chronic kidney disease: Secondary | ICD-10-CM | POA: Diagnosis not present

## 2019-09-22 DIAGNOSIS — E871 Hypo-osmolality and hyponatremia: Secondary | ICD-10-CM | POA: Diagnosis not present

## 2019-09-22 DIAGNOSIS — I129 Hypertensive chronic kidney disease with stage 1 through stage 4 chronic kidney disease, or unspecified chronic kidney disease: Secondary | ICD-10-CM | POA: Diagnosis not present

## 2019-09-22 DIAGNOSIS — R809 Proteinuria, unspecified: Secondary | ICD-10-CM | POA: Diagnosis not present

## 2019-09-26 DIAGNOSIS — B029 Zoster without complications: Secondary | ICD-10-CM | POA: Diagnosis not present

## 2019-10-03 DIAGNOSIS — I2510 Atherosclerotic heart disease of native coronary artery without angina pectoris: Secondary | ICD-10-CM | POA: Diagnosis not present

## 2019-10-03 DIAGNOSIS — I10 Essential (primary) hypertension: Secondary | ICD-10-CM | POA: Diagnosis not present

## 2019-10-03 DIAGNOSIS — M8580 Other specified disorders of bone density and structure, unspecified site: Secondary | ICD-10-CM | POA: Diagnosis not present

## 2019-10-03 DIAGNOSIS — N184 Chronic kidney disease, stage 4 (severe): Secondary | ICD-10-CM | POA: Diagnosis not present

## 2019-10-03 DIAGNOSIS — E1142 Type 2 diabetes mellitus with diabetic polyneuropathy: Secondary | ICD-10-CM | POA: Diagnosis not present

## 2019-10-03 DIAGNOSIS — E7800 Pure hypercholesterolemia, unspecified: Secondary | ICD-10-CM | POA: Diagnosis not present

## 2019-10-03 DIAGNOSIS — E119 Type 2 diabetes mellitus without complications: Secondary | ICD-10-CM | POA: Diagnosis not present

## 2019-10-09 DIAGNOSIS — E1165 Type 2 diabetes mellitus with hyperglycemia: Secondary | ICD-10-CM | POA: Diagnosis not present

## 2019-10-18 DIAGNOSIS — M8580 Other specified disorders of bone density and structure, unspecified site: Secondary | ICD-10-CM | POA: Diagnosis not present

## 2019-10-18 DIAGNOSIS — I10 Essential (primary) hypertension: Secondary | ICD-10-CM | POA: Diagnosis not present

## 2019-10-18 DIAGNOSIS — I2510 Atherosclerotic heart disease of native coronary artery without angina pectoris: Secondary | ICD-10-CM | POA: Diagnosis not present

## 2019-10-18 DIAGNOSIS — N184 Chronic kidney disease, stage 4 (severe): Secondary | ICD-10-CM | POA: Diagnosis not present

## 2019-10-18 DIAGNOSIS — E119 Type 2 diabetes mellitus without complications: Secondary | ICD-10-CM | POA: Diagnosis not present

## 2019-10-18 DIAGNOSIS — E1142 Type 2 diabetes mellitus with diabetic polyneuropathy: Secondary | ICD-10-CM | POA: Diagnosis not present

## 2019-10-18 DIAGNOSIS — E7800 Pure hypercholesterolemia, unspecified: Secondary | ICD-10-CM | POA: Diagnosis not present

## 2019-10-24 DIAGNOSIS — M5412 Radiculopathy, cervical region: Secondary | ICD-10-CM | POA: Diagnosis not present

## 2019-10-24 DIAGNOSIS — M9902 Segmental and somatic dysfunction of thoracic region: Secondary | ICD-10-CM | POA: Diagnosis not present

## 2019-10-24 DIAGNOSIS — M5033 Other cervical disc degeneration, cervicothoracic region: Secondary | ICD-10-CM | POA: Diagnosis not present

## 2019-10-24 DIAGNOSIS — M9901 Segmental and somatic dysfunction of cervical region: Secondary | ICD-10-CM | POA: Diagnosis not present

## 2019-10-25 DIAGNOSIS — H401131 Primary open-angle glaucoma, bilateral, mild stage: Secondary | ICD-10-CM | POA: Diagnosis not present

## 2019-10-25 DIAGNOSIS — H02889 Meibomian gland dysfunction of unspecified eye, unspecified eyelid: Secondary | ICD-10-CM | POA: Diagnosis not present

## 2019-11-08 DIAGNOSIS — E1165 Type 2 diabetes mellitus with hyperglycemia: Secondary | ICD-10-CM | POA: Diagnosis not present

## 2019-11-21 DIAGNOSIS — M5412 Radiculopathy, cervical region: Secondary | ICD-10-CM | POA: Diagnosis not present

## 2019-11-21 DIAGNOSIS — M9901 Segmental and somatic dysfunction of cervical region: Secondary | ICD-10-CM | POA: Diagnosis not present

## 2019-11-21 DIAGNOSIS — M9902 Segmental and somatic dysfunction of thoracic region: Secondary | ICD-10-CM | POA: Diagnosis not present

## 2019-11-21 DIAGNOSIS — M5033 Other cervical disc degeneration, cervicothoracic region: Secondary | ICD-10-CM | POA: Diagnosis not present

## 2019-11-23 DIAGNOSIS — E291 Testicular hypofunction: Secondary | ICD-10-CM | POA: Diagnosis not present

## 2019-11-23 DIAGNOSIS — R2681 Unsteadiness on feet: Secondary | ICD-10-CM | POA: Diagnosis not present

## 2019-11-23 DIAGNOSIS — I639 Cerebral infarction, unspecified: Secondary | ICD-10-CM | POA: Diagnosis not present

## 2019-11-23 DIAGNOSIS — I679 Cerebrovascular disease, unspecified: Secondary | ICD-10-CM | POA: Diagnosis not present

## 2019-11-23 DIAGNOSIS — E1142 Type 2 diabetes mellitus with diabetic polyneuropathy: Secondary | ICD-10-CM | POA: Diagnosis not present

## 2019-11-23 DIAGNOSIS — R972 Elevated prostate specific antigen [PSA]: Secondary | ICD-10-CM | POA: Diagnosis not present

## 2019-11-23 DIAGNOSIS — M549 Dorsalgia, unspecified: Secondary | ICD-10-CM | POA: Diagnosis not present

## 2019-12-05 DIAGNOSIS — M9902 Segmental and somatic dysfunction of thoracic region: Secondary | ICD-10-CM | POA: Diagnosis not present

## 2019-12-05 DIAGNOSIS — M5033 Other cervical disc degeneration, cervicothoracic region: Secondary | ICD-10-CM | POA: Diagnosis not present

## 2019-12-05 DIAGNOSIS — M545 Low back pain: Secondary | ICD-10-CM | POA: Diagnosis not present

## 2019-12-05 DIAGNOSIS — M5412 Radiculopathy, cervical region: Secondary | ICD-10-CM | POA: Diagnosis not present

## 2019-12-05 DIAGNOSIS — M9901 Segmental and somatic dysfunction of cervical region: Secondary | ICD-10-CM | POA: Diagnosis not present

## 2019-12-09 DIAGNOSIS — E1165 Type 2 diabetes mellitus with hyperglycemia: Secondary | ICD-10-CM | POA: Diagnosis not present

## 2019-12-19 DIAGNOSIS — M5412 Radiculopathy, cervical region: Secondary | ICD-10-CM | POA: Diagnosis not present

## 2019-12-19 DIAGNOSIS — M5033 Other cervical disc degeneration, cervicothoracic region: Secondary | ICD-10-CM | POA: Diagnosis not present

## 2019-12-19 DIAGNOSIS — M9901 Segmental and somatic dysfunction of cervical region: Secondary | ICD-10-CM | POA: Diagnosis not present

## 2019-12-19 DIAGNOSIS — M9902 Segmental and somatic dysfunction of thoracic region: Secondary | ICD-10-CM | POA: Diagnosis not present

## 2019-12-21 DIAGNOSIS — X32XXXA Exposure to sunlight, initial encounter: Secondary | ICD-10-CM | POA: Diagnosis not present

## 2019-12-21 DIAGNOSIS — D485 Neoplasm of uncertain behavior of skin: Secondary | ICD-10-CM | POA: Diagnosis not present

## 2019-12-21 DIAGNOSIS — Z08 Encounter for follow-up examination after completed treatment for malignant neoplasm: Secondary | ICD-10-CM | POA: Diagnosis not present

## 2019-12-21 DIAGNOSIS — C44319 Basal cell carcinoma of skin of other parts of face: Secondary | ICD-10-CM | POA: Diagnosis not present

## 2019-12-21 DIAGNOSIS — L570 Actinic keratosis: Secondary | ICD-10-CM | POA: Diagnosis not present

## 2019-12-21 DIAGNOSIS — L821 Other seborrheic keratosis: Secondary | ICD-10-CM | POA: Diagnosis not present

## 2019-12-21 DIAGNOSIS — Z85828 Personal history of other malignant neoplasm of skin: Secondary | ICD-10-CM | POA: Diagnosis not present

## 2019-12-22 DIAGNOSIS — R0781 Pleurodynia: Secondary | ICD-10-CM | POA: Diagnosis not present

## 2019-12-22 DIAGNOSIS — S82025A Nondisplaced longitudinal fracture of left patella, initial encounter for closed fracture: Secondary | ICD-10-CM | POA: Diagnosis not present

## 2019-12-22 DIAGNOSIS — M25562 Pain in left knee: Secondary | ICD-10-CM | POA: Diagnosis not present

## 2020-01-02 DIAGNOSIS — M5033 Other cervical disc degeneration, cervicothoracic region: Secondary | ICD-10-CM | POA: Diagnosis not present

## 2020-01-02 DIAGNOSIS — M9902 Segmental and somatic dysfunction of thoracic region: Secondary | ICD-10-CM | POA: Diagnosis not present

## 2020-01-02 DIAGNOSIS — M5412 Radiculopathy, cervical region: Secondary | ICD-10-CM | POA: Diagnosis not present

## 2020-01-02 DIAGNOSIS — M9901 Segmental and somatic dysfunction of cervical region: Secondary | ICD-10-CM | POA: Diagnosis not present

## 2020-01-05 DIAGNOSIS — M25562 Pain in left knee: Secondary | ICD-10-CM | POA: Diagnosis not present

## 2020-01-06 DIAGNOSIS — R5383 Other fatigue: Secondary | ICD-10-CM | POA: Diagnosis not present

## 2020-01-06 DIAGNOSIS — R1013 Epigastric pain: Secondary | ICD-10-CM | POA: Diagnosis not present

## 2020-01-06 DIAGNOSIS — R109 Unspecified abdominal pain: Secondary | ICD-10-CM | POA: Diagnosis not present

## 2020-01-06 DIAGNOSIS — R634 Abnormal weight loss: Secondary | ICD-10-CM | POA: Diagnosis not present

## 2020-01-08 DIAGNOSIS — E1165 Type 2 diabetes mellitus with hyperglycemia: Secondary | ICD-10-CM | POA: Diagnosis not present

## 2020-01-11 DIAGNOSIS — N183 Chronic kidney disease, stage 3 unspecified: Secondary | ICD-10-CM | POA: Diagnosis not present

## 2020-01-12 ENCOUNTER — Inpatient Hospital Stay (HOSPITAL_COMMUNITY)
Admission: EM | Admit: 2020-01-12 | Discharge: 2020-01-14 | DRG: 312 | Disposition: A | Discharge status: Home / Self Care | Payer: PPO | Attending: Internal Medicine | Admitting: Internal Medicine

## 2020-01-12 ENCOUNTER — Other Ambulatory Visit: Payer: Self-pay

## 2020-01-12 DIAGNOSIS — I4510 Unspecified right bundle-branch block: Secondary | ICD-10-CM | POA: Diagnosis present

## 2020-01-12 DIAGNOSIS — E1122 Type 2 diabetes mellitus with diabetic chronic kidney disease: Secondary | ICD-10-CM | POA: Diagnosis present

## 2020-01-12 DIAGNOSIS — I2510 Atherosclerotic heart disease of native coronary artery without angina pectoris: Secondary | ICD-10-CM | POA: Diagnosis present

## 2020-01-12 DIAGNOSIS — I130 Hypertensive heart and chronic kidney disease with heart failure and stage 1 through stage 4 chronic kidney disease, or unspecified chronic kidney disease: Secondary | ICD-10-CM | POA: Diagnosis present

## 2020-01-12 DIAGNOSIS — I252 Old myocardial infarction: Secondary | ICD-10-CM | POA: Diagnosis not present

## 2020-01-12 DIAGNOSIS — N184 Chronic kidney disease, stage 4 (severe): Secondary | ICD-10-CM | POA: Diagnosis present

## 2020-01-12 DIAGNOSIS — Z87891 Personal history of nicotine dependence: Secondary | ICD-10-CM

## 2020-01-12 DIAGNOSIS — I10 Essential (primary) hypertension: Secondary | ICD-10-CM | POA: Diagnosis present

## 2020-01-12 DIAGNOSIS — E118 Type 2 diabetes mellitus with unspecified complications: Secondary | ICD-10-CM

## 2020-01-12 DIAGNOSIS — K219 Gastro-esophageal reflux disease without esophagitis: Secondary | ICD-10-CM | POA: Diagnosis present

## 2020-01-12 DIAGNOSIS — E785 Hyperlipidemia, unspecified: Secondary | ICD-10-CM | POA: Diagnosis present

## 2020-01-12 DIAGNOSIS — I5042 Chronic combined systolic (congestive) and diastolic (congestive) heart failure: Secondary | ICD-10-CM | POA: Diagnosis present

## 2020-01-12 DIAGNOSIS — Z8249 Family history of ischemic heart disease and other diseases of the circulatory system: Secondary | ICD-10-CM

## 2020-01-12 DIAGNOSIS — Z880 Allergy status to penicillin: Secondary | ICD-10-CM

## 2020-01-12 DIAGNOSIS — K2100 Gastro-esophageal reflux disease with esophagitis, without bleeding: Secondary | ICD-10-CM | POA: Diagnosis present

## 2020-01-12 DIAGNOSIS — Z79899 Other long term (current) drug therapy: Secondary | ICD-10-CM

## 2020-01-12 DIAGNOSIS — G4733 Obstructive sleep apnea (adult) (pediatric): Secondary | ICD-10-CM

## 2020-01-12 DIAGNOSIS — N179 Acute kidney failure, unspecified: Secondary | ICD-10-CM | POA: Diagnosis present

## 2020-01-12 DIAGNOSIS — Z83438 Family history of other disorder of lipoprotein metabolism and other lipidemia: Secondary | ICD-10-CM

## 2020-01-12 DIAGNOSIS — Z20822 Contact with and (suspected) exposure to covid-19: Secondary | ICD-10-CM | POA: Diagnosis present

## 2020-01-12 DIAGNOSIS — R4020 Unspecified coma: Secondary | ICD-10-CM | POA: Diagnosis not present

## 2020-01-12 DIAGNOSIS — E860 Dehydration: Secondary | ICD-10-CM | POA: Diagnosis present

## 2020-01-12 DIAGNOSIS — R55 Syncope and collapse: Secondary | ICD-10-CM | POA: Diagnosis present

## 2020-01-12 DIAGNOSIS — I25709 Atherosclerosis of coronary artery bypass graft(s), unspecified, with unspecified angina pectoris: Secondary | ICD-10-CM | POA: Diagnosis present

## 2020-01-12 DIAGNOSIS — Z9989 Dependence on other enabling machines and devices: Secondary | ICD-10-CM

## 2020-01-12 DIAGNOSIS — K580 Irritable bowel syndrome with diarrhea: Secondary | ICD-10-CM | POA: Diagnosis present

## 2020-01-12 DIAGNOSIS — I739 Peripheral vascular disease, unspecified: Secondary | ICD-10-CM | POA: Diagnosis not present

## 2020-01-12 DIAGNOSIS — E1159 Type 2 diabetes mellitus with other circulatory complications: Secondary | ICD-10-CM

## 2020-01-12 DIAGNOSIS — R0902 Hypoxemia: Secondary | ICD-10-CM | POA: Diagnosis not present

## 2020-01-12 DIAGNOSIS — E1151 Type 2 diabetes mellitus with diabetic peripheral angiopathy without gangrene: Secondary | ICD-10-CM | POA: Diagnosis present

## 2020-01-12 DIAGNOSIS — E1142 Type 2 diabetes mellitus with diabetic polyneuropathy: Secondary | ICD-10-CM | POA: Diagnosis present

## 2020-01-12 DIAGNOSIS — Z8673 Personal history of transient ischemic attack (TIA), and cerebral infarction without residual deficits: Secondary | ICD-10-CM

## 2020-01-12 DIAGNOSIS — I4891 Unspecified atrial fibrillation: Secondary | ICD-10-CM | POA: Diagnosis not present

## 2020-01-12 DIAGNOSIS — R42 Dizziness and giddiness: Secondary | ICD-10-CM | POA: Diagnosis not present

## 2020-01-12 DIAGNOSIS — Z823 Family history of stroke: Secondary | ICD-10-CM

## 2020-01-12 DIAGNOSIS — M1990 Unspecified osteoarthritis, unspecified site: Secondary | ICD-10-CM | POA: Diagnosis present

## 2020-01-12 DIAGNOSIS — M810 Age-related osteoporosis without current pathological fracture: Secondary | ICD-10-CM | POA: Diagnosis present

## 2020-01-12 DIAGNOSIS — I951 Orthostatic hypotension: Principal | ICD-10-CM | POA: Diagnosis present

## 2020-01-12 DIAGNOSIS — Z79890 Hormone replacement therapy (postmenopausal): Secondary | ICD-10-CM

## 2020-01-12 DIAGNOSIS — I480 Paroxysmal atrial fibrillation: Secondary | ICD-10-CM | POA: Diagnosis present

## 2020-01-12 DIAGNOSIS — E1169 Type 2 diabetes mellitus with other specified complication: Secondary | ICD-10-CM | POA: Diagnosis present

## 2020-01-12 DIAGNOSIS — Z03818 Encounter for observation for suspected exposure to other biological agents ruled out: Secondary | ICD-10-CM | POA: Diagnosis not present

## 2020-01-12 DIAGNOSIS — Z7902 Long term (current) use of antithrombotics/antiplatelets: Secondary | ICD-10-CM

## 2020-01-12 DIAGNOSIS — Z885 Allergy status to narcotic agent status: Secondary | ICD-10-CM

## 2020-01-12 DIAGNOSIS — Z882 Allergy status to sulfonamides status: Secondary | ICD-10-CM

## 2020-01-12 DIAGNOSIS — N189 Chronic kidney disease, unspecified: Secondary | ICD-10-CM | POA: Diagnosis not present

## 2020-01-12 DIAGNOSIS — Z794 Long term (current) use of insulin: Secondary | ICD-10-CM

## 2020-01-12 DIAGNOSIS — Z7982 Long term (current) use of aspirin: Secondary | ICD-10-CM

## 2020-01-12 DIAGNOSIS — E876 Hypokalemia: Secondary | ICD-10-CM | POA: Diagnosis present

## 2020-01-12 HISTORY — DX: Syncope and collapse: R55

## 2020-01-12 LAB — CBC
HCT: 41.6 % (ref 39.0–52.0)
Hemoglobin: 14.1 g/dL (ref 13.0–17.0)
MCH: 33.7 pg (ref 26.0–34.0)
MCHC: 33.9 g/dL (ref 30.0–36.0)
MCV: 99.5 fL (ref 80.0–100.0)
Platelets: 196 10*3/uL (ref 150–400)
RBC: 4.18 MIL/uL — ABNORMAL LOW (ref 4.22–5.81)
RDW: 12.3 % (ref 11.5–15.5)
WBC: 9 10*3/uL (ref 4.0–10.5)
nRBC: 0 % (ref 0.0–0.2)

## 2020-01-12 LAB — GLUCOSE, CAPILLARY: Glucose-Capillary: 223 mg/dL — ABNORMAL HIGH (ref 70–99)

## 2020-01-12 LAB — COMPREHENSIVE METABOLIC PANEL
ALT: 24 U/L (ref 0–44)
AST: 23 U/L (ref 15–41)
Albumin: 3.4 g/dL — ABNORMAL LOW (ref 3.5–5.0)
Alkaline Phosphatase: 55 U/L (ref 38–126)
Anion gap: 14 (ref 5–15)
BUN: 62 mg/dL — ABNORMAL HIGH (ref 8–23)
CO2: 21 mmol/L — ABNORMAL LOW (ref 22–32)
Calcium: 8.6 mg/dL — ABNORMAL LOW (ref 8.9–10.3)
Chloride: 102 mmol/L (ref 98–111)
Creatinine, Ser: 2.66 mg/dL — ABNORMAL HIGH (ref 0.61–1.24)
GFR calc Af Amer: 26 mL/min — ABNORMAL LOW (ref >60)
GFR calc non Af Amer: 22 mL/min — ABNORMAL LOW (ref >60)
Glucose, Bld: 198 mg/dL — ABNORMAL HIGH (ref 70–99)
Potassium: 3.4 mmol/L — ABNORMAL LOW (ref 3.5–5.1)
Sodium: 137 mmol/L (ref 135–145)
Total Bilirubin: 0.9 mg/dL (ref 0.3–1.2)
Total Protein: 6.5 g/dL (ref 6.5–8.1)

## 2020-01-12 LAB — SARS CORONAVIRUS 2 BY RT PCR (HOSPITAL ORDER, PERFORMED IN ~~LOC~~ HOSPITAL LAB): SARS Coronavirus 2: NEGATIVE

## 2020-01-12 MED ORDER — SODIUM CHLORIDE 0.9 % IV SOLN: INTRAVENOUS | Status: DC

## 2020-01-12 MED ORDER — INSULIN DEGLUDEC 100 UNIT/ML ~~LOC~~ SOPN: 16.0000 [IU] | PEN_INJECTOR | Freq: Every day | SUBCUTANEOUS | Status: DC

## 2020-01-12 MED ORDER — SIMETHICONE 80 MG PO CHEW
80.0000 mg | CHEWABLE_TABLET | Freq: Four times a day (QID) | ORAL | Status: DC
  Administered 2020-01-12 – 2020-01-14 (×6): 80 mg via ORAL
  Filled 2020-01-12 (×6): qty 1

## 2020-01-12 MED ORDER — APIXABAN 5 MG PO TABS
5.0000 mg | ORAL_TABLET | Freq: Two times a day (BID) | ORAL | Status: DC
  Administered 2020-01-12 – 2020-01-14 (×4): 5 mg via ORAL
  Filled 2020-01-12 (×4): qty 1

## 2020-01-12 MED ORDER — VITAMIN D 25 MCG (1000 UNIT) PO TABS
500.0000 [IU] | ORAL_TABLET | Freq: Every day | ORAL | Status: DC
  Administered 2020-01-13 – 2020-01-14 (×2): 500 [IU] via ORAL
  Filled 2020-01-12 (×2): qty 1

## 2020-01-12 MED ORDER — METOPROLOL TARTRATE 50 MG PO TABS
50.0000 mg | ORAL_TABLET | Freq: Two times a day (BID) | ORAL | Status: DC
  Administered 2020-01-13 – 2020-01-14 (×3): 50 mg via ORAL
  Filled 2020-01-12 (×3): qty 1

## 2020-01-12 MED ORDER — METOPROLOL TARTRATE 50 MG PO TABS: 50.0000 mg | ORAL_TABLET | Freq: Two times a day (BID) | ORAL | Status: DC

## 2020-01-12 MED ORDER — ASPIRIN EC 81 MG PO TBEC
81.0000 mg | DELAYED_RELEASE_TABLET | Freq: Every day | ORAL | Status: DC
  Administered 2020-01-13 – 2020-01-14 (×2): 81 mg via ORAL
  Filled 2020-01-12 (×3): qty 1

## 2020-01-12 MED ORDER — INSULIN ASPART 100 UNIT/ML ~~LOC~~ SOLN
0.0000 [IU] | Freq: Three times a day (TID) | SUBCUTANEOUS | Status: DC
  Administered 2020-01-13: 5 [IU] via SUBCUTANEOUS
  Administered 2020-01-13 – 2020-01-14 (×4): 3 [IU] via SUBCUTANEOUS

## 2020-01-12 MED ORDER — ATORVASTATIN CALCIUM 40 MG PO TABS
40.0000 mg | ORAL_TABLET | Freq: Every day | ORAL | Status: DC
  Administered 2020-01-13 – 2020-01-14 (×2): 40 mg via ORAL
  Filled 2020-01-12 (×2): qty 1

## 2020-01-12 MED ORDER — PANTOPRAZOLE SODIUM 40 MG PO TBEC
40.0000 mg | DELAYED_RELEASE_TABLET | Freq: Every day | ORAL | Status: DC
  Administered 2020-01-13 – 2020-01-14 (×2): 40 mg via ORAL
  Filled 2020-01-12 (×2): qty 1

## 2020-01-12 MED ORDER — INSULIN ASPART 100 UNIT/ML ~~LOC~~ SOLN
0.0000 [IU] | Freq: Every day | SUBCUTANEOUS | Status: DC
  Administered 2020-01-12: 2 [IU] via SUBCUTANEOUS

## 2020-01-12 MED ORDER — ASPIRIN 81 MG PO TBEC: 81.0000 mg | DELAYED_RELEASE_TABLET | Freq: Every day | ORAL | Status: DC

## 2020-01-12 MED ORDER — CLOPIDOGREL BISULFATE 75 MG PO TABS: 75.0000 mg | ORAL_TABLET | Freq: Every day | ORAL | Status: DC

## 2020-01-12 MED ORDER — SODIUM CHLORIDE 0.9% FLUSH
3.0000 mL | Freq: Two times a day (BID) | INTRAVENOUS | Status: DC
  Administered 2020-01-13 – 2020-01-14 (×2): 3 mL via INTRAVENOUS

## 2020-01-12 MED ORDER — INSULIN GLARGINE 100 UNIT/ML ~~LOC~~ SOLN
16.0000 [IU] | Freq: Every day | SUBCUTANEOUS | Status: DC
  Administered 2020-01-13 – 2020-01-14 (×2): 16 [IU] via SUBCUTANEOUS
  Filled 2020-01-12 (×2): qty 0.16

## 2020-01-12 NOTE — ED Provider Notes (Signed)
I received pt in signout from Dr. Ashok Cordia. Briefly, he has h/o CABG and had presented w/ syncopal episode after working out at gym. EKG showing new A fib. At time of signout, pending completion of labs with likely admission for syncope and A fib.  Labs reassuring. Discussed admission w/ [facility] hospitalist.    Rex Kras Wenda Overland, MD 01/13/20 [phone]

## 2020-01-12 NOTE — Progress Notes (Signed)
ANTICOAGULATION CONSULT NOTE - Initial Consult  Pharmacy Consult for apixaban Indication: atrial fibrillation  Allergies  Allergen Reactions  . Hydrocodone Nausea Only  . Oxycodone Nausea Only  . Penicillins Rash    Has patient had a PCN reaction causing immediate rash, facial/tongue/throat swelling, SOB or lightheadedness with hypotension: NO Has patient had a PCN reaction causing severe rash involving mucus membranes or skin necrosis:NO Has patient had a PCN reaction that required hospitalization NO Has patient had a PCN reaction occurring within the last 10 years: NO If all of the above answers are "NO", then may proceed with Cephalosporin use.   [facility] Cross Reactors Rash    Patient Measurements: Height: 5\' 11"  (180.3 cm) Weight: 92.5 kg (204 lb) IBW/kg (Calculated) : 75.3  Vital Signs: Temp: 98.4 F (36.9 C) (06/03 2014) Temp Source: Oral (06/03 2014) BP: 104/60 (06/03 2014) Pulse Rate: 92 (06/03 2014)  Labs: Recent Labs    01/12/20 1349  HGB 14.1  HCT 41.6  PLT 196  CREATININE 2.66*    Estimated Creatinine Clearance: 27.9 mL/min (A) (by C-G formula based on SCr of 2.66 mg/dL (H)).   Medical History: Past Medical History:  Diagnosis Date  . Anemia   . Arthritis   . Carotid artery occlusion    a. Carotid US 10/16: RICA 60-79%, L CEA patent with 1-39% stenosis  . Coronary artery disease    a. Myoview 9/16:  EF 52%, inferior fixed defect consistent with diaphragmatic attenuation, intermediate risk secondary to poor exercise tolerance and symptoms during stress;  b. LHC 05/17/2015 90% mid LAD, 80% ost D2, 75% mid RCA, 35% prox RCA. >> S/p CABG  . Diabetes mellitus age 74  . Diverticulitis   . GERD (gastroesophageal reflux disease)   . H/O hiatal hernia   . History of echocardiogram    a. Echo 9/16: GLS -15.2%, EF 37-90%, grade 1 diastolic dysfunction, normal wall motion, aortic sclerosis, dilated aortic root 39 mm, atrial septal lipomatous hypertrophy   . Hyperlipidemia   . Hypertension   . Irritable bowel syndrome (IBS)   . Myocardial infarction [facility])    silent inferior MI; patient denies MI history (03/17/13)   . Neuropathy 2013  . Osteoporosis 2013  . PAF (paroxysmal atrial fibrillation) ([place])    post CABG; Amiodarone stopped 2/2 wheezing;   . Peripheral vascular disease ([place])   . Reflux esophagitis   . Sleep apnea    uses CPAP  . Stroke [facility]) 1987   Right brain stroke  . Wears glasses      Assessment: 74 yoM admitted with AFib. Pt has CHADSVASc 6, no anticoagulation prior to admit. Pharmacy consulted for apixaban dosing.   Goal of Therapy:  Monitor platelets by anticoagulation protocol: Yes   Plan:  -Apixaban 5mg  BID -TOC consult for apixaban copay -Pharmacy will sign off, reconsult as needed   Arrie Senate, PharmD, BCPS Clinical Pharmacist [phone] Please check AMION for all [facility] Pharmacy numbers 01/12/2020

## 2020-01-12 NOTE — H&P (Addendum)
TRH H&P   Patient Demographics:    Bradley [name], is a 74 y.o. male  MRN: [id number]   DOB - May 24, 1946  Admit Date - 01/12/2020  Outpatient Primary MD for the patient is [name], MD  Referring MD/NP/PA: Dr Rex Kras  Outpatient Specialists: Cardiology Dr Tamala Julian  Patient coming from: home  Chief Complaint  Patient presents with  . Near Syncope      HPI:    Bradley [name]  is a 74 y.o. male, with a hx of CAD status post CABG in 2016 (LIMA-LAD, SVG-D2, SVG-PDA), chronic diastolic heart failure (EF 60-65% in 08/2019), PAD status post left CEA x2 and aorto-bifemoral bypass, OSA on CPAP, T2DM on T[place]iba, patient presents to ED secondary to evaluation for syncopal episodes, it did happen today at the gym, after he was using the treadmill, no symptoms during his activity, patient some generalized weakness, lightheaded after that episode, where he had to sit down, ports he wanted to have a bowel movement when he was getting up to go to the bathroom, he felt lightheaded he had brief near syncopal episodes, (patient denies any total loss of consciousness, but his trainer report he did lose consciousness), denies any chest pain, palpitation, or dyspnea, patient reports systolic blood pressure was low when checked by [facility] staff reported it was in the 6s, reports EMS came may be after 1 hour where his systolic blood pressure was in the low 90s, he denies any focal deficits, tingling, numbness, slurred speech, any seizure activities, wife reports patient had another similar episode 5 weeks ago. -Patient endorses 13 pound weight loss and some intermittent diarrhea, reports he has been taking his torsemide regularly, is held his losartan given soft blood pressure for a few weeks, but he did resume it yesterday he thought stopping his losartan did cause him to have constipation, so he resumed it  because of constipation not high blood pressure. -In ED blood pressure was 120s 2/76, his EKG showing A. fib with right bundle branch block, potassium low at 3.4, his creatinine has worsened from 2.3 baseline to 2.66 with BUN of 62, orthostatic has not been checked yet, triage hospitalist were consulted to admit.    Review of systems:    In addition to the HPI above,  No Fever-chills, denies generalized weakness, fatigue, 13 pound weight loss over last few weeks. No Headache, No changes with Vision or hearing, No problems swallowing food or Liquids, No Chest pain, Cough or Shortness of Breath, No Abdominal pain, No Nausea or Vommitting, does report some bloating and diarrhea No Blood in stool or Urine, No dysuria, No new skin rashes or bruises, No new joints pains-aches,  No new weakness, tingling, numbness in any extremity, No recent weight gain or loss, No polyuria, polydypsia or polyphagia, No significant Mental Stressors.  A full 10 point Review of Systems was done, except as stated above,  all other Review of Systems were negative.   With Past History of the following :    Past Medical History:  Diagnosis Date  . Anemia   . Arthritis   . Carotid artery occlusion    a. Carotid US 10/16: RICA 60-79%, L CEA patent with 1-39% stenosis  . Coronary artery disease    a. Myoview 9/16:  EF 52%, inferior fixed defect consistent with diaphragmatic attenuation, intermediate risk secondary to poor exercise tolerance and symptoms during stress;  b. LHC 05/17/2015 90% mid LAD, 80% ost D2, 75% mid RCA, 35% prox RCA. >> S/p CABG  . Diabetes mellitus age 62  . Diverticulitis   . GERD (gastroesophageal reflux disease)   . H/O hiatal hernia   . History of echocardiogram    a. Echo 9/16: GLS -15.2%, EF 41-93%, grade 1 diastolic dysfunction, normal wall motion, aortic sclerosis, dilated aortic root 39 mm, atrial septal lipomatous hypertrophy  . Hyperlipidemia   . Hypertension   . Irritable  bowel syndrome (IBS)   . Myocardial infarction [facility])    silent inferior MI; patient denies MI history (03/17/13)   . Neuropathy 2013  . Osteoporosis 2013  . PAF (paroxysmal atrial fibrillation) ([place])    post CABG; Amiodarone stopped 2/2 wheezing;   . Peripheral vascular disease ([place])   . Reflux esophagitis   . Sleep apnea    uses CPAP  . Stroke [facility]) 1987   Right brain stroke  . Wears glasses       Past Surgical History:  Procedure Laterality Date  . CARDIAC CATHETERIZATION N/A 05/17/2015   Procedure: Left Heart Cath and Coronary Angiography;  Surgeon: Belva Crome, MD;  Location: [place] [facility];  Service: Cardiovascular;  Laterality: N/A;  . CAROTID ENDARTERECTOMY  1994 & redo 2001   Left  . COLONOSCOPY W/ BIOPSIES AND POLYPECTOMY    . CORONARY ARTERY BYPASS GRAFT N/A 05/23/2015   Procedure: CORONARY ARTERY BYPASS GRAFTING (CABG) x 3 (LIMA to LAD, SVG to DIAGONAL 2, SVG to PDA) with Endoscopic Vein Harvesting from right greater saphenous vein;  Surgeon: Grace Isaac, MD;  Location: [place];  Service: Open Heart Surgery;  Laterality: N/A;  . FRACTURE SURGERY  2013   Right   foo  t X's 2  . ILIAC ARTERY STENT    . LAPAROSCOPIC CHOLECYSTECTOMY  09/11/2016  . POSTERIOR LUMBAR FUSION  Aug. 14, 2014   Level 1  . PR VEIN BYPASS GRAFT,AORTO-FEM-POP  1998  . [place] SURGERY  2014  . TEE WITHOUT CARDIOVERSION N/A 05/23/2015   Procedure: TRANSESOPHAGEAL ECHOCARDIOGRAM (TEE);  Surgeon: Grace Isaac, MD;  Location: [place];  Service: Open Heart Surgery;  Laterality: N/A;  . TONSILLECTOMY    . TRIGGER FINGER RELEASE Right 07/14/2017   Procedure: RIGHT LONG FINGER TRIGGER RELEASE;  Surgeon: Milly Jakob, MD;  Location: [place];  Service: Orthopedics;  Laterality: Right;      Social History:     Social History   Tobacco Use  . Smoking status: Former Smoker    Packs/day: 2.00    Years: 40.00    Pack years: 80.00    Types: Cigarettes    Quit date:  03/26/2009    Years since quitting: 10.8  . Smokeless tobacco: Never Used  Substance Use Topics  . Alcohol use: No       Family History :     Family History  Problem Relation Age of Onset  . Cancer Mother 60  pancreatic  . Hyperlipidemia Mother   . Stroke Father 80  . Deep vein thrombosis Father   . Hyperlipidemia Father   . Hypertension Father      Home Medications:   Prior to Admission medications   Medication Sig Start Date End Date Taking? Authorizing Provider  aspirin EC 81 MG EC tablet Take 1 tablet (81 mg total) by mouth daily. 06/04/15   Gold, Wayne E, PA-C  atorvastatin (LIPITOR) 40 MG tablet Take 40 mg by mouth daily.      [provider]  Cholecalciferol (VITAMIN D3 PO) Take 500 Units by mouth daily.    [provider]  clopidogrel (PLAVIX) 75 MG tablet Take 1 tablet (75 mg total) by mouth daily. 07/13/15   Richardson Dopp T, PA-C  dexlansoprazole (DEXILANT) 60 MG capsule Take 60 mg by mouth every other day.     [provider]  FIASP FLEXTOUCH 100 UNIT/ML SOPN INJECT 20 TO 25 UNITS AT MEALS THREE TIMES A DAY UP. TO 60 UNITS PER DAY 03/30/19   [provider]  gabapentin (NEURONTIN) 300 MG capsule TK 1-2 CS PO QD TO BID 02/20/19   [provider]  ketoconazole (NIZORAL) 2 % shampoo USE ON NECK AND FACE AS [place]. LEAVE ON 5 MINUTES. RINSE WELL USE 2-3 TIMES WEEKLY 02/19/19   [provider]  L-Methylfolate-Algae-B12-B6 [place] Quam RF) 3-90.314-2-35 MG CAPS TK ONE C PO BID 02/22/19   [provider]  losartan (COZAAR) 50 MG tablet TK 1 T PO QD. TK WITH HCTZ D 05/18/19   [provider]  metoprolol tartrate (LOPRESSOR) 50 MG tablet Take 1 tablet (50 mg total) by mouth 2 (two) times daily. Please call [phone] for appointment for more refills thanks. FINAL attempt 03/22/18   Belva Crome, MD  testosterone cypionate (DEPOTESTOSTERONE CYPIONATE) 200 MG/ML injection INJECT 0.6MLS IM Q WK 03/17/19   [provider]  torsemide (DEMADEX) 20 MG tablet Take 1 tablet (20 mg total) by mouth daily. 08/03/19   Belva Crome, MD  TRESIBA FLEXTOUCH 100 UNIT/ML SOPN FlexTouch Pen INJECT 24 UNITS ONCE DAILY 03/30/19   [provider]     Allergies:     Allergies  Allergen Reactions  . Hydrocodone Nausea Only  . Oxycodone Nausea Only  . Penicillins Rash    Has patient had a PCN reaction causing immediate rash, facial/tongue/throat swelling, SOB or lightheadedness with hypotension: NO Has patient had a PCN reaction causing severe rash involving mucus membranes or skin necrosis:NO Has patient had a PCN reaction that required hospitalization NO Has patient had a PCN reaction occurring within the last 10 years: NO If all of the above answers are "NO", then may proceed with Cephalosporin use.   [facility] Cross Reactors Rash     Physical Exam:   Vitals  Blood pressure 122/76, pulse 99, resp. rate 20, height 5\' 11"  (1.803 m), weight 92.5 kg, SpO2 99 %.   1. General developed male, laying in bed, no apparent distress  2. Normal affect and insight, Not Suicidal or Homicidal, Awake Alert, Oriented X 3.  3. No F.N deficits, ALL C.Nerves Intact, Strength 5/5 all 4 extremities, Sensation intact all 4 extremities, Plantars down going.  4. Ears and Eyes appear Normal, Conjunctivae clear, PERRLA. Moist Oral Mucosa.  5. Supple Neck, No JVD, No cervical lymphadenopathy appriciated, No Carotid Bruits.  6. Symmetrical Chest wall movement, Good air movement bilaterally, CTAB.  7.  Irregular irregular, No Gallops, Rubs or Murmurs, No  Parasternal Heave.  8. Positive Bowel Sounds, Abdomen Soft, No tenderness, No organomegaly appriciated,No rebound -guarding or rigidity.  9.  No Cyanosis, slow skin Turgor, No Skin Rash or Bruise.  10. Good muscle tone,  joints appear normal , no effusions, Normal ROM.  11. No Palpable Lymph Nodes in Neck or Axillae     Data Review:    CBC Recent  Labs  Lab 01/12/20 1349  WBC 9.0  HGB 14.1  HCT 41.6  PLT 196  MCV 99.5  MCH 33.7  MCHC 33.9  RDW 12.3   ------------------------------------------------------------------------------------------------------------------  Chemistries  Recent Labs  Lab 01/12/20 1349  NA 137  K 3.4*  CL 102  CO2 21*  GLUCOSE 198*  BUN 62*  CREATININE 2.66*  CALCIUM 8.6*  AST 23  ALT 24  ALKPHOS 55  BILITOT 0.9   ------------------------------------------------------------------------------------------------------------------ estimated creatinine clearance is 27.9 mL/min (A) (by C-G formula based on SCr of 2.66 mg/dL (H)). ------------------------------------------------------------------------------------------------------------------ No results for input(s): TSH, T4TOTAL, T3FREE, THYROIDAB in the last 72 hours.  Invalid input(s): FREET3  Coagulation profile No results for input(s): INR, PROTIME in the last 168 hours. ------------------------------------------------------------------------------------------------------------------- No results for input(s): DDIMER in the last 72 hours. -------------------------------------------------------------------------------------------------------------------  Cardiac Enzymes No results for input(s): CKMB, TROPONINI, MYOGLOBIN in the last 168 hours.  Invalid input(s): CK ------------------------------------------------------------------------------------------------------------------ No results found for: BNP   ---------------------------------------------------------------------------------------------------------------  Urinalysis    Component Value Date/Time   COLORURINE YELLOW 11/13/2017 [place] 11/13/2017 1613   LABSPEC 1.010 11/13/2017 1613   PHURINE 6.0 11/13/2017 1613   GLUCOSEU >=500 (A) 11/13/2017 1613   HGBUR SMALL (A) 11/13/2017 1613   BILIRUBINUR NEGATIVE 11/13/2017 1613   KETONESUR 5 (A) 11/13/2017  1613   PROTEINUR NEGATIVE 11/13/2017 1613   UROBILINOGEN 0.2 05/21/2015 1331   NITRITE NEGATIVE 11/13/2017 1613   LEUKOCYTESUR NEGATIVE 11/13/2017 1613    ----------------------------------------------------------------------------------------------------------------   Imaging Results:    No results found.  My personal review of EKG: Rhythm right bundle branch block, Rate  84 /min,    Assessment & Plan:    Active Problems:   Peripheral vascular disease ([place])   Essential hypertension   Hyperlipidemia   DM type 2, controlled, with complication ([name])   OSA on CPAP   PAF (paroxysmal atrial fibrillation) ([name])   Coronary artery disease involving coronary bypass graft of native heart with angina pectoris ([place])   Syncope  Near-syncope/syncope -This is most likely in the setting of low blood pressure, and vasovagal as well, and some dehydration as well, patient reported blood pressure was in the 50s after that event, was in the 90s EMS . -Blood pressure most likely low due to medications, torsemide, and dehydration, vasovagal most likely related to his patient wanted to have a bowel movement. -Will hold his blood pressure medications currently, and keep on gentle hydration over the next 10 hours. -Has history of PVD, and left carotid stenosis, will obtain vascular ultrasound as well of the carotid. -We will monitor on telemetry. -Repeat 2D echo.  Paroxysmal A. Fib -CHA2DS2-VASc score significantly elevated at 8, high risk for acute CVA, patient currently on Aggrenox and Plavix. -Heart rate controlled, will hold metoprolol for now.he is appropriately hydrated. -I have requested cardiology input regarding recommendation for anticoagulation, given he remains on dual antiplatelet therapy as well  Peripheral vascular disease/left carotid artery disease -Status post left carotid endarterectomy x2, 1994 and 2001, and status post aortobifemoral bypass 1998. -Patient currently on Aggrenox  and Plavix, will await cardiology  recommendation. -Continue with statin  Combined systolic/diastolic CHF -Does appear to be on the dry side, continue to hold torsemide monitor closely as on gentle hydration. -You on losartan and metoprolol once appropriately hydrated.  CAD status post CABG -On Plavix, Aggrenox, statin, beta-blockers and losartan.  AKI on CKD stage IV -Following with [place] [facility], Creatinine 2-2.3, currently 2.66 on admission, continue with gentle hydration  Hypokalemia -Repleted, recheck in a.m.  Diabetes mellitus -We will resume [place] at a lower dose 20 units instead of 25, and will keep on sliding scale meanwhile  GERD -Continue with PPI, as well patient report of some bloating, will start on simethicone  Hypertension -Hold meds due to syncope  OSA -CPAP nightly     DVT Prophylaxis >> will need full anticoagulation once seen by cardiology  AM Labs Ordered, also please review Full Orders  Family Communication: Admission, patients condition and plan of care including tests being ordered have been discussed with the patient and wife at bedside who indicate understanding and agree with the plan and Code Status.  Code Status Full  Likely DC to  Home  Condition GUARDED    Consults called: cardiology  Admission status: Observation  Time spent in minutes : 60 minutes   Phillips Climes M.D on 01/12/2020 at 5:45 PM   [facility] - Office  [phone]

## 2020-01-12 NOTE — Consult Note (Signed)
Cardiology Consultation:   Patient ID: Bradley [name] MRN: [id number]; DOB: February 29, 1946  Admit date: 01/12/2020 Date of Consult: 01/12/2020  Primary Care Provider: [name], MD [facility] Cardiologist: Sinclair Grooms, MD  [facility] Electrophysiologist:  None    Patient Profile:   Bradley [name] is a 74 y.o. male with a hx of CAD status post CABG in 2016 (LIMA-LAD, SVG-D2, SVG-PDA), chronic diastolic heart failure (EF 60-65% in 08/2019), PAD status post left CEA x2 and aorto-bifemoral bypass, OSA on CPAP, T2DM who is being seen today for the evaluation of atrial fibrillation at the request of Dr Waldron Labs.  History of Present Illness:   Bradley [name] is a 73 year old male with the above medical history who presents with syncopal episode.  He reports that he had a syncopal episode 4 weeks ago.  States that he has been having low blood pressures, down to SBP 80s to 90s.  Also has been having diarrhea.  States that 4 weeks ago he stood up during the night to go to the bathroom and while walking to the bathroom felt lightheaded and then lost consciousness.  He fell on his knees than his head.  Reports that he broke his kneecap and suffered a laceration on his forehead.  Given his low blood pressure recently, he stopped taking losartan about 2 weeks ago.  Reports BP has been improved since that time.  However today he took losartan for the first time in 2 weeks.  He went to the gym today and was walking on the treadmill.  Reports that he walked on the treadmill for 12 minutes and denied any chest pain or dyspnea.  No exertional lightheadedness.  However after he finished his walk, he was resting and began feeling lightheaded.  He checked his blood glucose, which was 176.  He tried standing up but felt like he might pass out.  Blood pressure was checked and SBP was in 50s.  He then started having diarrhea.  Given the symptoms, he was taken to the ED.  In the ED, initial vitals showed BP  122/76, pulse 96, SPO2 99% on room air.  Labs notable for creatinine 2.7 (2.4 on 08/16/2019), hemoglobin 14.1, WBC 9.0, platelets 196, albumin 3.4.  EKG shows atrial fibrillation, right bundle branch block, rate 84.    Follows with Dr. Tamala Julian.  Most recently seen on 08/23/2019.  TTE on 08/16/2019 showed LVEF 60 to 65%, G1 DD, normal RV function, no significant valvular disease.   Past Medical History:  Diagnosis Date  . Anemia   . Arthritis   . Carotid artery occlusion    a. Carotid US 10/16: RICA 60-79%, L CEA patent with 1-39% stenosis  . Coronary artery disease    a. Myoview 9/16:  EF 52%, inferior fixed defect consistent with diaphragmatic attenuation, intermediate risk secondary to poor exercise tolerance and symptoms during stress;  b. LHC 05/17/2015 90% mid LAD, 80% ost D2, 75% mid RCA, 35% prox RCA. >> S/p CABG  . Diabetes mellitus age 72  . Diverticulitis   . GERD (gastroesophageal reflux disease)   . H/O hiatal hernia   . History of echocardiogram    a. Echo 9/16: GLS -15.2%, EF 92-42%, grade 1 diastolic dysfunction, normal wall motion, aortic sclerosis, dilated aortic root 39 mm, atrial septal lipomatous hypertrophy  . Hyperlipidemia   . Hypertension   . Irritable bowel syndrome (IBS)   . Myocardial infarction [facility])    silent inferior MI; patient denies MI history (  03/17/13)   . Neuropathy 2013  . Osteoporosis 2013  . PAF (paroxysmal atrial fibrillation) ([place])    post CABG; Amiodarone stopped 2/2 wheezing;   . Peripheral vascular disease ([place])   . Reflux esophagitis   . Sleep apnea    uses CPAP  . Stroke [facility]) 1987   Right brain stroke  . Wears glasses     Past Surgical History:  Procedure Laterality Date  . CARDIAC CATHETERIZATION N/A 05/17/2015   Procedure: Left Heart Cath and Coronary Angiography;  Surgeon: Belva Crome, MD;  Location: [place] [facility];  Service: Cardiovascular;  Laterality: N/A;  . CAROTID ENDARTERECTOMY  1994 & redo 2001   Left  . COLONOSCOPY W/  BIOPSIES AND POLYPECTOMY    . CORONARY ARTERY BYPASS GRAFT N/A 05/23/2015   Procedure: CORONARY ARTERY BYPASS GRAFTING (CABG) x 3 (LIMA to LAD, SVG to DIAGONAL 2, SVG to PDA) with Endoscopic Vein Harvesting from right greater saphenous vein;  Surgeon: Grace Isaac, MD;  Location: [place];  Service: Open Heart Surgery;  Laterality: N/A;  . FRACTURE SURGERY  2013   Right   foo  t X's 2  . ILIAC ARTERY STENT    . LAPAROSCOPIC CHOLECYSTECTOMY  09/11/2016  . POSTERIOR LUMBAR FUSION  Aug. 14, 2014   Level 1  . PR VEIN BYPASS GRAFT,AORTO-FEM-POP  1998  . [place] SURGERY  2014  . TEE WITHOUT CARDIOVERSION N/A 05/23/2015   Procedure: TRANSESOPHAGEAL ECHOCARDIOGRAM (TEE);  Surgeon: Grace Isaac, MD;  Location: [place];  Service: Open Heart Surgery;  Laterality: N/A;  . TONSILLECTOMY    . TRIGGER FINGER RELEASE Right 07/14/2017   Procedure: RIGHT LONG FINGER TRIGGER RELEASE;  Surgeon: Milly Jakob, MD;  Location: [place];  Service: Orthopedics;  Laterality: Right;      Inpatient Medications: Scheduled Meds: . aspirin  81 mg Oral Daily  . atorvastatin  40 mg Oral Daily  . cholecalciferol  500 Units Oral Daily  . clopidogrel  75 mg Oral Daily  . pantoprazole  40 mg Oral Daily  . sodium chloride flush  3 mL Intravenous Q12H   Continuous Infusions: . sodium chloride     PRN Meds:   Allergies:    Allergies  Allergen Reactions  . Hydrocodone Nausea Only  . Oxycodone Nausea Only  . Penicillins Rash    Has patient had a PCN reaction causing immediate rash, facial/tongue/throat swelling, SOB or lightheadedness with hypotension: NO Has patient had a PCN reaction causing severe rash involving mucus membranes or skin necrosis:NO Has patient had a PCN reaction that required hospitalization NO Has patient had a PCN reaction occurring within the last 10 years: NO If all of the above answers are "NO", then may proceed with Cephalosporin use.   [facility] Cross Reactors  Rash    Social History:   Social History   Socioeconomic History  . Marital status: Married    Spouse name: Not on file  . Number of children: Not on file  . Years of education: Not on file  . Highest education level: Not on file  Occupational History  . Not on file  Tobacco Use  . Smoking status: Former Smoker    Packs/day: 2.00    Years: 40.00    Pack years: 80.00    Types: Cigarettes    Quit date: 03/26/2009    Years since quitting: 10.8  . Smokeless tobacco: Never Used  Substance and Sexual Activity  . Alcohol use: No  .  Drug use: No  . Sexual activity: Not on file  Other Topics Concern  . Not on file  Social History Narrative  . Not on file   Social Determinants of Health   Financial Resource Strain:   . Difficulty of Paying Living Expenses:   Food Insecurity:   . Worried About Charity fundraiser in the Last Year:   . Arboriculturist in the Last Year:   Transportation Needs:   . Film/video editor (Medical):   Marland Kitchen Lack of Transportation (Non-Medical):   Physical Activity:   . Days of Exercise per Week:   . Minutes of Exercise per Session:   Stress:   . Feeling of Stress :   Social Connections:   . Frequency of Communication with Friends and Family:   . Frequency of Social Gatherings with Friends and Family:   . Attends Religious Services:   . Active Member of Clubs or Organizations:   . Attends Archivist Meetings:   Marland Kitchen Marital Status:   Intimate Partner Violence:   . Fear of Current or Ex-Partner:   . Emotionally Abused:   Marland Kitchen Physically Abused:   . Sexually Abused:     Family History:    Family History  Problem Relation Age of Onset  . Cancer Mother 99       pancreatic  . Hyperlipidemia Mother   . Stroke Father 27  . Deep vein thrombosis Father   . Hyperlipidemia Father   . Hypertension Father      ROS:  Please see the history of present illness.   All other ROS reviewed and negative.     Physical Exam/Data:   Vitals:    01/12/20 1350 01/12/20 1600 01/12/20 1700 01/12/20 1715  BP:      Pulse:  85 86 99  Resp:  (!) 22 17 20   SpO2: 97% 95% 98% 99%  Weight:      Height:       No intake or output data in the 24 hours ending 01/12/20 1759 Last 3 Weights 01/12/2020 08/23/2019 08/03/2019  Weight (lbs) 204 lb 224 lb 12.8 oz 222 lb 12.8 oz  Weight (kg) 92.534 kg 101.969 kg 101.061 kg     Body mass index is 28.45 kg/m.  General:   in no acute distress* HEENT: normal Neck: no JVD Cardiac:  normal S1, S2; RRR; no murmur  Lungs:  clear to auscultation bilaterally, Abd: soft, nontende Ext: no edema Musculoskeletal:  No deformities Skin: warm and dry  Neuro:   no focal abnormalities noted Psych:  Normal affect   EKG:  The EKG was personally reviewed and demonstrates:  atrial fibrillation, right bundle branch block, rate 84.   Telemetry:  Telemetry was personally reviewed and demonstrates: Initially in atrial fibrillation with rates 80s to 90s, converted to sinus rhythm with rate 90s  Relevant CV Studies: TTE 08/16/19: 1. Left ventricular ejection fraction, by visual estimation, is 60 to  65%. The left ventricle has normal function. There is no left ventricular  hypertrophy.  2. Left ventricular diastolic parameters are consistent with Grade I  diastolic dysfunction (impaired relaxation).  3. The left ventricle has no regional wall motion abnormalities.  4. Global right ventricle has normal systolic function.The right  ventricular size is normal. No increase in right ventricular wall  thickness.  5. Left atrial size was normal.  6. Right atrial size was normal.  7. The mitral valve is normal in structure. No evidence of mitral valve  regurgitation. No evidence of mitral stenosis.  8. The tricuspid valve is normal in structure.  9. The aortic valve is normal in structure. Aortic valve regurgitation is  not visualized. Mild to moderate aortic valve sclerosis/calcification  without any evidence of  aortic stenosis.  10. The pulmonic valve was normal in structure. Pulmonic valve  regurgitation is not visualized.  11. The inferior vena cava is normal in size with greater than 50%  respiratory variability, suggesting right atrial pressure of 3 mmHg.  12. The average left ventricular global longitudinal strain is -13.9 %.  Laboratory Data:  High Sensitivity Troponin:  No results for input(s): TROPONINIHS in the last 720 hours.   Chemistry Recent Labs  Lab 01/12/20 1349  NA 137  K 3.4*  CL 102  CO2 21*  GLUCOSE 198*  BUN 62*  CREATININE 2.66*  CALCIUM 8.6*  GFRNONAA 22*  GFRAA 26*  ANIONGAP 14    Recent Labs  Lab 01/12/20 1349  PROT 6.5  ALBUMIN 3.4*  AST 23  ALT 24  ALKPHOS 55  BILITOT 0.9   Hematology Recent Labs  Lab 01/12/20 1349  WBC 9.0  RBC 4.18*  HGB 14.1  HCT 41.6  MCV 99.5  MCH 33.7  MCHC 33.9  RDW 12.3  PLT 196   BNPNo results for input(s): BNP, PROBNP in the last 168 hours.  DDimer No results for input(s): DDIMER in the last 168 hours.   Radiology/Studies:  No results found. {   Assessment and Plan:   Syncope: Reported syncopal episode 4 weeks ago and near syncopal episode today.  Description of both episodes consistent with orthostatic hypotension.  Suspect his low blood pressure recently has been due to to diarrhea while continuing to take his torsemide, in addition to taking metoprolol and losartan.   -Discontinue losartan -Hold torsemide -TTE -Monitor on telemetry  Atrial fibrillation: New diagnosis.  Rates well controlled on metoprolol.  He was asymptomatic.  Spontaneously converted to normal sinus rhythm in the ED. CHA2DS2-VASc score 8 (CHF, HTN, Agex2, DM, CVAx2, CAD) - Continue metoprolol - Start Eliquis for anticoagulation.  He is currently on aspirin/Plavix given his PAD history.  No recent coronary or peripheral stenting.  To avoid triple therapy, will discontinue Plavix and continue on aspirin plus Eliquis  Chronic  diastolic heart failure: On torsemide 20 mg daily.  Has been having issues with diarrhea and has lost 13 pounds in last 5 weeks. -Suspect he is dry, will hold torsemide  CAD: status post CABG in 2016 (LIMA-LAD, SVG-D2, SVG-PDA) -Continue ASA 81 mg daily.  Will hold Plavix since starting Eliquis as above -Continue atorvastatin 40 mg daily  -Continue metoprolol  PAD: Status post left CEA x2 and aortobifemoral bypass -Continue aspirin, statin  Hypertension: On losartan 50 mg daily and metoprolol 50 mg twice daily.  BP has been soft, down to SBP 80s to 90s at home. -Discontinue losartan as above -Continue metoprolol for rate control of atrial fibrillation   For questions or updates, please contact [place] Please consult [email or web address] for contact info under    Signed, Donato Heinz, MD  01/12/2020 5:59 PM

## 2020-01-12 NOTE — ED Triage Notes (Signed)
Patient arrived via [facility] stating that patient was at the gym on the treadmill and because dizzy and lightheaded. EMS stated that per gym staff patient passed out however the patient stated he remembers everything and don't remember anything. Per EMS when patient became more arousal he stated he needed to go to the bathroom. However the patient did not make and had a bowel movement on himself.  Per EMS EKG showed a-fib patient stated he had a history of a-fib for 2 days in 2016.  Patient alert and oriented x 4, skin warm and dry to touch. Denies any pain at this time.

## 2020-01-12 NOTE — Discharge Instructions (Addendum)
If you have any symptoms of fast heart rate or not feeling well call the office of Dr. Tamala Julian.  Information on my medicine - ELIQUIS (apixaban)  Why was Eliquis prescribed for you? Eliquis was prescribed for you to reduce the risk of forming blood clots that can cause a stroke if you have a medical condition called atrial fibrillation (a type of irregular heartbeat) OR to reduce the risk of a blood clots forming after orthopedic surgery.  What do You need to know about Eliquis ? Take your Eliquis TWICE DAILY - one tablet in the morning and one tablet in the evening with or without food.  It would be best to take the doses about the same time each day.  If you have difficulty swallowing the tablet whole please discuss with your pharmacist how to take the medication safely.  Take Eliquis exactly as prescribed by your doctor and DO NOT stop taking Eliquis without talking to the doctor who prescribed the medication.  Stopping may increase your risk of developing a new clot or stroke.  Refill your prescription before you run out.  After discharge, you should have regular check-up appointments with your healthcare provider that is prescribing your Eliquis.  In the future your dose may need to be changed if your kidney function or weight changes by a significant amount or as you get older.  What do you do if you miss a dose? If you miss a dose, take it as soon as you remember on the same day and resume taking twice daily.  Do not take more than one dose of ELIQUIS at the same time.  Important Safety Information A possible side effect of Eliquis is bleeding. You should call your healthcare provider right away if you experience any of the following: ? Bleeding from an injury or your nose that does not stop. ? Unusual colored urine (red or dark brown) or unusual colored stools (red or black). ? Unusual bruising for unknown reasons. ? A serious fall or if you hit your head (even if there is no  bleeding).  Some medicines may interact with Eliquis and might increase your risk of bleeding or clotting while on Eliquis. To help avoid this, consult your healthcare provider or pharmacist prior to using any new prescription or non-prescription medications, including herbals, vitamins, non-steroidal anti-inflammatory drugs (NSAIDs) and supplements.  This website has more information on Eliquis (apixaban): [email or web address].   Additional discharge instructions  Please get your medications reviewed and adjusted by your Primary MD.  Please request your Primary MD to go over all Hospital Tests and Procedure/Radiological results at the follow up, please get all Hospital records sent to your Prim MD by signing hospital release before you go home.  If you had Pneumonia of Lung problems at the Hospital: Please get a 2 view Chest X ray done in 6-8 weeks after hospital discharge or sooner if instructed by your Primary MD.  If you have Congestive Heart Failure: Please call your Cardiologist or Primary MD anytime you have any of the following symptoms:  1) 3 pound weight gain in 24 hours or 5 pounds in 1 week  2) shortness of breath, with or without a dry hacking cough  3) swelling in the hands, feet or stomach  4) if you have to sleep on extra pillows at night in order to breathe  Follow cardiac low salt diet and 1.5 lit/day fluid restriction.  If you have diabetes Accuchecks 4 times/day, Once in AM empty stomach  and then before each meal. Log in all results and show them to your primary doctor at your next visit. If any glucose reading is under 80 or above 300 call your primary MD immediately.  If you have Seizure/Convulsions/Epilepsy: Please do not drive, operate heavy machinery, participate in activities at heights or participate in high speed sports until you have seen by Primary MD or a Neurologist and advised to do so again.  If you had Gastrointestinal Bleeding: Please ask your  Primary MD to check a complete blood count within one week of discharge or at your next visit. Your endoscopic/colonoscopic biopsies that are pending at the time of discharge, will also need to followed by your Primary MD.  Get Medicines reviewed and adjusted. Please take all your medications with you for your next visit with your Primary MD  Please request your Primary MD to go over all hospital tests and procedure/radiological results at the follow up, please ask your Primary MD to get all Hospital records sent to his/her office.  If you experience worsening of your admission symptoms, develop shortness of breath, life threatening emergency, suicidal or homicidal thoughts you must seek medical attention immediately by calling 911 or calling your MD immediately  if symptoms less severe.  You must read complete instructions/literature along with all the possible adverse reactions/side effects for all the Medicines you take and that have been prescribed to you. Take any new Medicines after you have completely understood and accpet all the possible adverse reactions/side effects.   Do not drive or operate heavy machinery when taking Pain medications.   Do not take more than prescribed Pain, Sleep and Anxiety Medications  Special Instructions: If you have smoked or chewed Tobacco  in the last 2 yrs please stop smoking, stop any regular Alcohol  and or any Recreational drug use.  Wear Seat belts while driving.  Please note You were cared for by a hospitalist during your hospital stay. If you have any questions about your discharge medications or the care you received while you were in the hospital after you are discharged, you can call the unit and asked to speak with the hospitalist on call if the hospitalist that took care of you is not available. Once you are discharged, your primary care physician will handle any further medical issues. Please note that NO REFILLS for any discharge medications  will be authorized once you are discharged, as it is imperative that you return to your primary care physician (or establish a relationship with a primary care physician if you do not have one) for your aftercare needs so that they can reassess your need for medications and monitor your lab values.  You can reach the hospitalist office at phone [phone] or fax [phone]   If you do not have a primary care physician, you can call [phone] for a physician referral.

## 2020-01-12 NOTE — ED Provider Notes (Signed)
[name] Provider Note   CSN: [id number] Arrival date & time: 01/12/20  1333     History Chief Complaint  Patient presents with  . Near Syncope    [name] is a 74 y.o. male.  Patient presents via EMS with syncopal event. Patient indicates was at gym, used treadmill, felt fine when exercising, was resting post exercise when began to feel faint/lightheaded. Pt then felt as if had to have bm, and got up to go to bathroom when felt very lightheaded, ?brief syncopal event. Denies any associated chest pain or discomfort. No sob or unusual doe. No palpitations or sense of rapid or irregular heartbeat. +recent constipation, straining to have bm. Denies abd pain or distension. No vomiting. States recent bps had been relatively low, ?110/, so he had stopped taking his losartan. Pt notes that he did resume taking the losartan this AM. Had eaten/drank relatively little today. No blood loss, rectal bleeding or melena. No fever or chills.   The history is provided by the patient and the EMS personnel.  Near Syncope Pertinent negatives include no chest pain, no abdominal pain, no headaches and no shortness of breath.       Past Medical History:  Diagnosis Date  . Anemia   . Arthritis   . Carotid artery occlusion    a. Carotid US 10/16: RICA 60-79%, L CEA patent with 1-39% stenosis  . Coronary artery disease    a. Myoview 9/16:  EF 52%, inferior fixed defect consistent with diaphragmatic attenuation, intermediate risk secondary to poor exercise tolerance and symptoms during stress;  b. LHC 05/17/2015 90% mid LAD, 80% ost D2, 75% mid RCA, 35% prox RCA. >> S/p CABG  . Diabetes mellitus age 69  . Diverticulitis   . GERD (gastroesophageal reflux disease)   . H/O hiatal hernia   . History of echocardiogram    a. Echo 9/16: GLS -15.2%, EF 96-22%, grade 1 diastolic dysfunction, normal wall motion, aortic sclerosis, dilated aortic root 39 mm, atrial  septal lipomatous hypertrophy  . Hyperlipidemia   . Hypertension   . Irritable bowel syndrome (IBS)   . Myocardial infarction [facility])    silent inferior MI; patient denies MI history (03/17/13)   . Neuropathy 2013  . Osteoporosis 2013  . PAF (paroxysmal atrial fibrillation) ([place])    post CABG; Amiodarone stopped 2/2 wheezing;   . Peripheral vascular disease ([place])   . Reflux esophagitis   . Sleep apnea    uses CPAP  . Stroke [facility]) 1987   Right brain stroke  . Wears glasses     Patient Active Problem List   Diagnosis Date Noted  . Coagulation disorder ([place]) 05/25/2019  . Coronary artery disease involving coronary bypass graft of native heart with angina pectoris ([place])   . PAF (paroxysmal atrial fibrillation) ([place])   . Type 2 diabetes mellitus with vascular disease ([place]) 05/30/2015  . OSA on CPAP 05/30/2015  . Abnormal stress test 05/18/2015  . Diverticulosis of colon without hemorrhage 05/17/2015  . Right bundle branch block 04/30/2015  . Essential hypertension 04/29/2015  . Hyperlipidemia 04/29/2015  . DM type 2, controlled, with complication ([place]) 29/79/8921  . Peripheral vascular disease ([place]) 06/08/2012  . Stress fracture of metatarsal bone 02/18/2012  . Acquired equinus deformity of foot 12/15/2011  . Plantar fasciitis 12/15/2011  . Stenosis of right internal carotid artery with cerebral infarction ([place]) 12/09/2011    Past Surgical History:  Procedure Laterality Date  . CARDIAC CATHETERIZATION  N/A 05/17/2015   Procedure: Left Heart Cath and Coronary Angiography;  Surgeon: Belva Crome, MD;  Location: [facility];  Service: Cardiovascular;  Laterality: N/A;  . CAROTID ENDARTERECTOMY  1994 & redo 2001   Left  . COLONOSCOPY W/ BIOPSIES AND POLYPECTOMY    . CORONARY ARTERY BYPASS GRAFT N/A 05/23/2015   Procedure: CORONARY ARTERY BYPASS GRAFTING (CABG) x 3 (LIMA to LAD, SVG to DIAGONAL 2, SVG to PDA) with Endoscopic Vein Harvesting from right greater saphenous vein;   Surgeon: Grace Isaac, MD;  Location: [facility];  Service: Open Heart Surgery;  Laterality: N/A;  . FRACTURE SURGERY  2013   Right   foo  t X's 2  . ILIAC ARTERY STENT    . LAPAROSCOPIC CHOLECYSTECTOMY  09/11/2016  . POSTERIOR LUMBAR FUSION  Aug. 14, 2014   Level 1  . PR VEIN BYPASS GRAFT,AORTO-FEM-POP  1998  . [place] SURGERY  2014  . TEE WITHOUT CARDIOVERSION N/A 05/23/2015   Procedure: TRANSESOPHAGEAL ECHOCARDIOGRAM (TEE);  Surgeon: Grace Isaac, MD;  Location: [facility];  Service: Open Heart Surgery;  Laterality: N/A;  . TONSILLECTOMY    . TRIGGER FINGER RELEASE Right 07/14/2017   Procedure: RIGHT LONG FINGER TRIGGER RELEASE;  Surgeon: Milly Jakob, MD;  Location: [facility];  Service: Orthopedics;  Laterality: Right;       Family History  Problem Relation Age of Onset  . Cancer Mother 32       pancreatic  . Hyperlipidemia Mother   . Stroke Father 47  . Deep vein thrombosis Father   . Hyperlipidemia Father   . Hypertension Father     Social History   Tobacco Use  . Smoking status: Former Smoker    Packs/day: 2.00    Years: 40.00    Pack years: 80.00    Types: Cigarettes    Quit date: 03/26/2009    Years since quitting: 10.8  . Smokeless tobacco: Never Used  Substance Use Topics  . Alcohol use: No  . Drug use: No    Home Medications Prior to Admission medications   Medication Sig Start Date End Date Taking? Authorizing Provider  aspirin EC 81 MG EC tablet Take 1 tablet (81 mg total) by mouth daily. 06/04/15   Gold, Wayne E, PA-C  atorvastatin (LIPITOR) 40 MG tablet Take 40 mg by mouth daily.      [provider]  Cholecalciferol (VITAMIN D3 PO) Take 500 Units by mouth daily.    [provider]  clopidogrel (PLAVIX) 75 MG tablet Take 1 tablet (75 mg total) by mouth daily. 07/13/15   Richardson Dopp T, PA-C  dexlansoprazole (DEXILANT) 60 MG capsule Take 60 mg by mouth every other day.     [provider]  FIASP FLEXTOUCH  100 UNIT/ML SOPN INJECT 20 TO 25 UNITS AT MEALS THREE TIMES A DAY UP. TO 60 UNITS PER DAY 03/30/19   [provider]  gabapentin (NEURONTIN) 300 MG capsule TK 1-2 CS PO QD TO BID 02/20/19   [provider]  ketoconazole (NIZORAL) 2 % shampoo USE ON NECK AND FACE AS [place]. LEAVE ON 5 MINUTES. RINSE WELL USE 2-3 TIMES WEEKLY 02/19/19   [provider]  L-Methylfolate-Algae-B12-B6 [place] RF) 3-90.314-2-35 MG CAPS TK ONE C PO BID 02/22/19   [provider]  losartan (COZAAR) 50 MG tablet TK 1 T PO QD. TK WITH HCTZ D 05/18/19   [provider]  metoprolol tartrate (LOPRESSOR) 50 MG tablet Take  1 tablet (50 mg total) by mouth 2 (two) times daily. Please call [phone] for appointment for more refills thanks. FINAL attempt 03/22/18   Belva Crome, MD  testosterone cypionate (DEPOTESTOSTERONE CYPIONATE) 200 MG/ML injection INJECT 0.6MLS IM Q WK 03/17/19   [provider]  torsemide (DEMADEX) 20 MG tablet Take 1 tablet (20 mg total) by mouth daily. 08/03/19   Belva Crome, MD  TRESIBA FLEXTOUCH 100 UNIT/ML SOPN FlexTouch Pen INJECT 24 UNITS ONCE DAILY 03/30/19   [provider]    Allergies    Hydrocodone, Oxycodone, Penicillins, and Sulfa drugs cross reactors  Review of Systems   Review of Systems  Constitutional: Negative for fever.  HENT: Negative for sore throat.   Eyes: Negative for visual disturbance.  Respiratory: Negative for cough and shortness of breath.   Cardiovascular: Positive for near-syncope. Negative for chest pain, palpitations and leg swelling.  Gastrointestinal: Negative for abdominal pain, blood in stool and vomiting.  Genitourinary: Negative for dysuria and flank pain.  Musculoskeletal: Negative for back pain and neck pain.  Skin: Negative for rash.  Neurological: Positive for light-headedness. Negative for speech difficulty, numbness and headaches.  Hematological: Does not bruise/bleed easily.    Psychiatric/Behavioral: Negative for confusion.    Physical Exam Updated Vital Signs BP 122/76   Pulse 96   Resp (!) 9   Ht 1.803 m (5\' 11" )   Wt 92.5 kg   SpO2 99%   BMI 28.45 kg/m   Physical Exam Vitals and nursing note reviewed.  Constitutional:      Appearance: Normal appearance. He is well-developed.  HENT:     Head: Atraumatic.     Nose: Nose normal.     Mouth/Throat:     Mouth: Mucous membranes are moist.     Pharynx: Oropharynx is clear.  Eyes:     General: No scleral icterus.    Conjunctiva/sclera: Conjunctivae normal.     Pupils: Pupils are equal, round, and reactive to light.  Neck:     Vascular: No carotid bruit.     Trachea: No tracheal deviation.  Cardiovascular:     Rate and Rhythm: Normal rate. Rhythm irregular.     Pulses: Normal pulses.     Heart sounds: Normal heart sounds. No murmur. No friction rub. No gallop.   Pulmonary:     Effort: Pulmonary effort is normal. No accessory muscle usage or respiratory distress.     Breath sounds: Normal breath sounds.  Abdominal:     General: Bowel sounds are normal. There is no distension.     Palpations: Abdomen is soft. There is no mass.     Tenderness: There is no abdominal tenderness. There is no guarding or rebound.     Hernia: No hernia is present.  Genitourinary:    Comments: No cva tenderness. Musculoskeletal:        General: No swelling or tenderness.     Cervical back: Normal range of motion and neck supple. No rigidity.     Right lower leg: No edema.     Left lower leg: No edema.  Skin:    General: Skin is warm and dry.     Findings: No rash.  Neurological:     Mental Status: He is alert.     Comments: Alert, speech clear. Motor intact bil, stre 5/5. Sens grossly intact bil. Steady gait.   Psychiatric:        Mood and Affect: Mood normal.     ED Results / Procedures /  Treatments   Labs (all labs ordered are listed, but only abnormal results are displayed) Results for orders placed or  performed during the hospital encounter of 01/12/20  CBC  Result Value Ref Range   WBC 9.0 4.0 - 10.5 K/uL   RBC 4.18 (L) 4.22 - 5.81 MIL/uL   Hemoglobin 14.1 13.0 - 17.0 g/dL   HCT 41.6 39.0 - 52.0 %   MCV 99.5 80.0 - 100.0 fL   MCH 33.7 26.0 - 34.0 pg   MCHC 33.9 30.0 - 36.0 g/dL   RDW 12.3 11.5 - 15.5 %   Platelets 196 150 - 400 K/uL   nRBC 0.0 0.0 - 0.2 %    EKG EKG Interpretation  Date/Time:  Thursday January 12 2020 13:44:58 EDT Ventricular Rate:  84 PR Interval:    QRS Duration: 147 QT Interval:  390 QTC Calculation: 461 R Axis:   -22 Text Interpretation: Atrial fibrillation Right bundle branch block Confirmed by Lajean Saver [phone]) on 01/12/2020 1:49:38 PM   Radiology No results found.  Procedures Procedures (including critical care time)  Medications Ordered in ED Medications - No data to display  ED Course  I have reviewed the triage vital signs and the nursing notes.  Pertinent labs & imaging results that were available during my care of the patient were reviewed by me and considered in my medical decision making (see chart for details).    MDM Rules/Calculators/A&P                      Continuous pulse ox and monitor. Stat labs. Ecg.   MDM Number of Diagnoses or Management Options Atrial fibrillation, new onset ([place]): new, needed workup Near syncope: new, needed workup   Amount and/or Complexity of Data Reviewed Clinical lab tests: ordered and reviewed Tests in the medicine section of CPT: ordered and reviewed Decide to obtain previous medical records or to obtain history from someone other than the patient: yes Obtain history from someone other than the patient: yes Review and summarize past medical records: yes Discuss the patient with other providers: yes Independent visualization of images, tracings, or specimens: yes  Risk of Complications, Morbidity, and/or Mortality Presenting problems: high Diagnostic procedures: high Management  options: high   Reviewed nursing notes and prior charts for additional history.   Orthostatic vitals.   Initial labs reviewed/interpreted by me -   Labs reviewed/interpreted by me - wbc normal, hgb normal.  1545, chem/ua pending - given newly recurrent afib of unclear duration, syncope, plan for admission. Signed out to Dr Rex Kras to check labs and facilitate admission when resulted.     Final Clinical Impression(s) / ED Diagnoses Final diagnoses:  None    Rx / DC Orders ED Discharge Orders    None       Lajean Saver, MD 01/12/20 1549

## 2020-01-13 ENCOUNTER — Observation Stay (HOSPITAL_COMMUNITY): Payer: PPO

## 2020-01-13 DIAGNOSIS — Z9989 Dependence on other enabling machines and devices: Secondary | ICD-10-CM

## 2020-01-13 DIAGNOSIS — I4510 Unspecified right bundle-branch block: Secondary | ICD-10-CM | POA: Diagnosis present

## 2020-01-13 DIAGNOSIS — I2510 Atherosclerotic heart disease of native coronary artery without angina pectoris: Secondary | ICD-10-CM | POA: Diagnosis present

## 2020-01-13 DIAGNOSIS — K580 Irritable bowel syndrome with diarrhea: Secondary | ICD-10-CM | POA: Diagnosis present

## 2020-01-13 DIAGNOSIS — I10 Essential (primary) hypertension: Secondary | ICD-10-CM | POA: Diagnosis not present

## 2020-01-13 DIAGNOSIS — I4891 Unspecified atrial fibrillation: Secondary | ICD-10-CM | POA: Diagnosis not present

## 2020-01-13 DIAGNOSIS — G4733 Obstructive sleep apnea (adult) (pediatric): Secondary | ICD-10-CM

## 2020-01-13 DIAGNOSIS — R55 Syncope and collapse: Secondary | ICD-10-CM

## 2020-01-13 DIAGNOSIS — E876 Hypokalemia: Secondary | ICD-10-CM | POA: Diagnosis present

## 2020-01-13 DIAGNOSIS — M1990 Unspecified osteoarthritis, unspecified site: Secondary | ICD-10-CM | POA: Diagnosis present

## 2020-01-13 DIAGNOSIS — I5042 Chronic combined systolic (congestive) and diastolic (congestive) heart failure: Secondary | ICD-10-CM | POA: Diagnosis present

## 2020-01-13 DIAGNOSIS — M810 Age-related osteoporosis without current pathological fracture: Secondary | ICD-10-CM | POA: Diagnosis present

## 2020-01-13 DIAGNOSIS — N179 Acute kidney failure, unspecified: Secondary | ICD-10-CM

## 2020-01-13 DIAGNOSIS — E1122 Type 2 diabetes mellitus with diabetic chronic kidney disease: Secondary | ICD-10-CM | POA: Diagnosis present

## 2020-01-13 DIAGNOSIS — N184 Chronic kidney disease, stage 4 (severe): Secondary | ICD-10-CM | POA: Diagnosis present

## 2020-01-13 DIAGNOSIS — E1151 Type 2 diabetes mellitus with diabetic peripheral angiopathy without gangrene: Secondary | ICD-10-CM | POA: Diagnosis present

## 2020-01-13 DIAGNOSIS — I25709 Atherosclerosis of coronary artery bypass graft(s), unspecified, with unspecified angina pectoris: Secondary | ICD-10-CM | POA: Diagnosis present

## 2020-01-13 DIAGNOSIS — K219 Gastro-esophageal reflux disease without esophagitis: Secondary | ICD-10-CM | POA: Diagnosis present

## 2020-01-13 DIAGNOSIS — K2100 Gastro-esophageal reflux disease with esophagitis, without bleeding: Secondary | ICD-10-CM | POA: Diagnosis present

## 2020-01-13 DIAGNOSIS — I130 Hypertensive heart and chronic kidney disease with heart failure and stage 1 through stage 4 chronic kidney disease, or unspecified chronic kidney disease: Secondary | ICD-10-CM | POA: Diagnosis present

## 2020-01-13 DIAGNOSIS — Z87891 Personal history of nicotine dependence: Secondary | ICD-10-CM | POA: Diagnosis not present

## 2020-01-13 DIAGNOSIS — N189 Chronic kidney disease, unspecified: Secondary | ICD-10-CM

## 2020-01-13 DIAGNOSIS — I951 Orthostatic hypotension: Secondary | ICD-10-CM | POA: Diagnosis present

## 2020-01-13 DIAGNOSIS — Z20822 Contact with and (suspected) exposure to covid-19: Secondary | ICD-10-CM | POA: Diagnosis present

## 2020-01-13 DIAGNOSIS — E860 Dehydration: Secondary | ICD-10-CM | POA: Diagnosis present

## 2020-01-13 DIAGNOSIS — I252 Old myocardial infarction: Secondary | ICD-10-CM | POA: Diagnosis not present

## 2020-01-13 DIAGNOSIS — E785 Hyperlipidemia, unspecified: Secondary | ICD-10-CM | POA: Diagnosis present

## 2020-01-13 DIAGNOSIS — I480 Paroxysmal atrial fibrillation: Secondary | ICD-10-CM | POA: Diagnosis present

## 2020-01-13 DIAGNOSIS — E1142 Type 2 diabetes mellitus with diabetic polyneuropathy: Secondary | ICD-10-CM | POA: Diagnosis present

## 2020-01-13 LAB — BASIC METABOLIC PANEL
Anion gap: 12 (ref 5–15)
BUN: 61 mg/dL — ABNORMAL HIGH (ref 8–23)
CO2: 26 mmol/L (ref 22–32)
Calcium: 8.6 mg/dL — ABNORMAL LOW (ref 8.9–10.3)
Chloride: 99 mmol/L (ref 98–111)
Creatinine, Ser: 2.88 mg/dL — ABNORMAL HIGH (ref 0.61–1.24)
GFR calc Af Amer: 24 mL/min — ABNORMAL LOW (ref >60)
GFR calc non Af Amer: 20 mL/min — ABNORMAL LOW (ref >60)
Glucose, Bld: 184 mg/dL — ABNORMAL HIGH (ref 70–99)
Potassium: 3.6 mmol/L (ref 3.5–5.1)
Sodium: 137 mmol/L (ref 135–145)

## 2020-01-13 LAB — GLUCOSE, CAPILLARY
Glucose-Capillary: 151 mg/dL — ABNORMAL HIGH (ref 70–99)
Glucose-Capillary: 198 mg/dL — ABNORMAL HIGH (ref 70–99)
Glucose-Capillary: 237 mg/dL — ABNORMAL HIGH (ref 70–99)
Glucose-Capillary: 165 mg/dL — ABNORMAL HIGH (ref 70–99)

## 2020-01-13 LAB — URINALYSIS, ROUTINE W REFLEX MICROSCOPIC
Bacteria, UA: NONE SEEN
Bilirubin Urine: NEGATIVE
Glucose, UA: >=500 mg/dL — AB
Hgb urine dipstick: NEGATIVE
Ketones, ur: NEGATIVE mg/dL
Leukocytes,Ua: NEGATIVE
Nitrite: NEGATIVE
Protein, ur: NEGATIVE mg/dL
Specific Gravity, Urine: 1.008 (ref 1.005–1.030)
pH: 6 (ref 5.0–8.0)

## 2020-01-13 LAB — CBC
HCT: 38.3 % — ABNORMAL LOW (ref 39.0–52.0)
Hemoglobin: 13.1 g/dL (ref 13.0–17.0)
MCH: 33.7 pg (ref 26.0–34.0)
MCHC: 34.2 g/dL (ref 30.0–36.0)
MCV: 98.5 fL (ref 80.0–100.0)
Platelets: 194 10*3/uL (ref 150–400)
RBC: 3.89 MIL/uL — ABNORMAL LOW (ref 4.22–5.81)
RDW: 12.3 % (ref 11.5–15.5)
WBC: 8.8 10*3/uL (ref 4.0–10.5)
nRBC: 0 % (ref 0.0–0.2)

## 2020-01-13 LAB — ECHOCARDIOGRAM COMPLETE
Height: 71 in
Weight: 3376 oz

## 2020-01-13 LAB — HEMOGLOBIN A1C
Hgb A1c MFr Bld: 8.3 % — ABNORMAL HIGH (ref 4.8–5.6)
Mean Plasma Glucose: 191.51 mg/dL

## 2020-01-13 MED ORDER — SODIUM CHLORIDE 0.9 % IV SOLN: INTRAVENOUS | Status: AC

## 2020-01-13 NOTE — Progress Notes (Signed)
PROGRESS NOTE   Bradley [name]  EKC:[id number]    DOB: [birth year]    DOA: 01/12/2020  PCP: Bradley Cruel, MD   I have briefly reviewed patients previous medical records in [facility].  Chief Complaint  Patient presents with  . Near Syncope    Brief Narrative:  74 year old married male, independent, PMH of CAD s/p CABG 5056, chronic diastolic CHF, PAD s/p CEA x2 and aortobifemoral bypass, OSA on CPAP, type II DM on Tresiba presented to ED due to an episode of syncope/near syncope on day of admission.  He completed a 12-minute walk on the treadmill which is his usual followed by the episode which was associated with hypotension with reported BPs of 50s by the gym staff, SBP in the low 90s by EMS.  He had similar episode 5 weeks ago leading to a fall.  Also reported 13 pound weight loss, intermittent diarrhea, ongoing torsemide use, resumed losartan which she had held after the initial syncope 5 weeks ago.  Cardiology consulted, echo stable but persistent severe orthostatic changes and at risk for recurrent syncope and falls.  Initiating gentle IV fluids and lower extremity compression stockings.   Assessment & Plan:  Active Problems:   Peripheral vascular disease ([name])   Essential hypertension   Hyperlipidemia   DM type 2, controlled, with complication ([name])   OSA on CPAP   PAF (paroxysmal atrial fibrillation) ([name])   Coronary artery disease involving coronary bypass graft of native heart with angina pectoris ([name])   Syncope   Recurrent syncope/near syncope:  Likely related to hypotension/orthostatic hypotension which may be multifactorial due to intermittent diarrhea, ongoing diuretic use, antihypertensives and possible autonomic neuropathy from diabetes.  Briefly hydrated overnight of admission.  Repeat orthostatic vitals show 35 point drop in SBP.  Reviewed in detail with Dr. Debara Pickett, cardiology, echo appears stable but agree with gentle IV fluid hydration, lower extremity  compression stockings and reassess in a.m.  Discontinued losartan and torsemide for now.  Continue metoprolol.  Patient counseled that he should not drive for 6 months and he verbalized understanding.  TTE, LVEF 55%.  Carotid Dopplers: Right ICA 40-59% stenosis, left ICA 1-39% stenosis.  Atrial fibrillation: New diagnosis.  Asymptomatic.  Spontaneously converted to sinus rhythm. CHA2DS2-VASc score 8.  Continue metoprolol.  Initiated Eliquis anticoagulation.  Was on aspirin and Plavix given PAD history but since no recent coronary or peripheral stenting, to avoid triple therapy complications, Plavix was discontinued and aspirin continued along with Eliquis.  Chronic diastolic CHF Has had issues with diarrhea and weight loss over the last 5 weeks.  Suspect ongoing dehydration.  Torsemide held.  Monitor with gentle IV fluids.  Acute on stage IV chronic kidney disease: Creatinine 2.3 likely his baseline in January 2021, presented with creatinine of 2.66 which is gone up to 2.88.  Hold nephrotoxic i.e. diuretics.  Brief IV fluids and follow  CAD s/p CABG No anginal symptoms.  Continue aspirin.  Plavix discontinued due to initiation of Eliquis.  Continue statins and metoprolol.  PAD s/p left CEA x2 and aortobifemoral bypass: Continue aspirin and statins.  Essential hypertension/orthostatic hypotension: Losartan discontinued.  Continue metoprolol.  Brief IV fluids, lower extremity compression stockings and follow orthostatic blood pressures.  Hypokalemia: Replaced  Type II DM with renal complications: Patient on reduced dose of Lantus and SSI, mildly uncontrolled.  Monitor and adjust as needed.  GERD: PPI  OSA: Continue nightly CPAP  Body mass index is 29.43 kg/m.   DVT prophylaxis: Eliquis  Code Status: Full Family Communication: None at bedside Disposition:  Status is: Inpatient    Dispo: The patient is from: Home              Anticipated d/c is to: Home               Anticipated d/c date is: 1 day              Patient currently is not medically stable to d/c.        Consultants:   Cardiology  Procedures:   None  Antimicrobials:   None   Subjective:  Seen this morning.  Denied complaints.  Reports that he ambulated to the bathroom without dizziness, lightheadedness, dyspnea, palpitations or chest pain.  Objective:   Vitals:   01/12/20 2014 01/13/20 0106 01/13/20 0452 01/13/20 1428  BP: 104/60 121/64 117/65 121/73  Pulse: 92 91 89 90  Resp: 16 17 20 18   Temp: 98.4 F (36.9 C) 98.3 F (36.8 C) 97.7 F (36.5 C) 97.9 F (36.6 C)  TempSrc: Oral Oral Oral Oral  SpO2: 96% 95% 98% 99%  Weight: 94.6 kg  95.7 kg   Height: 5\' 11"  (1.803 m)       General exam: Pleasant elderly male, moderately built and overweight lying comfortably propped up in bed.  Oral mucosa with borderline hydration. Respiratory system: Clear to auscultation. Respiratory effort normal. Cardiovascular system: S1 & S2 heard, RRR. No JVD, murmurs, rubs, gallops or clicks. No pedal edema.  Telemetry personally reviewed: Sinus rhythm with BBB morphology. Gastrointestinal system: Abdomen is nondistended, soft and nontender. No organomegaly or masses felt. Normal bowel sounds heard. Central nervous system: Alert and oriented. No focal neurological deficits. Extremities: Symmetric 5 x 5 power. Skin: No rashes, lesions or ulcers Psychiatry: Judgement and insight appear normal. Mood & affect appropriate.     Data Reviewed:   I have personally reviewed following labs and imaging studies   CBC: Recent Labs  Lab 01/12/20 1349 01/13/20 0434  WBC 9.0 8.8  HGB 14.1 13.1  HCT 41.6 38.3*  MCV 99.5 98.5  PLT 196 785    Basic Metabolic Panel: Recent Labs  Lab 01/12/20 1349 01/13/20 0434  NA 137 137  K 3.4* 3.6  CL 102 99  CO2 21* 26  GLUCOSE 198* 184*  BUN 62* 61*  CREATININE 2.66* 2.88*  CALCIUM 8.6* 8.6*    Liver Function Tests: Recent Labs  Lab  01/12/20 1349  AST 23  ALT 24  ALKPHOS 55  BILITOT 0.9  PROT 6.5  ALBUMIN 3.4*    CBG: Recent Labs  Lab 01/13/20 0748 01/13/20 1151 01/13/20 1616  GLUCAP 151* 198* 237*    Microbiology Studies:   Recent Results (from the past 240 hour(s))  SARS Coronavirus 2 by RT PCR (hospital order, performed in [facility] hospital lab) Nasopharyngeal Nasopharyngeal Swab     Status: None   Collection Time: 01/12/20  6:52 PM   Specimen: Nasopharyngeal Swab  Result Value Ref Range Status   SARS Coronavirus 2 NEGATIVE NEGATIVE Final    Comment: (NOTE) SARS-CoV-2 target nucleic acids are NOT DETECTED. The SARS-CoV-2 RNA is generally detectable in upper and lower respiratory specimens during the acute phase of infection. The lowest concentration of SARS-CoV-2 viral copies this assay can detect is 250 copies / mL. A negative result does not preclude SARS-CoV-2 infection and should not be used as the sole basis for treatment or other patient management decisions.  A negative result may occur  with improper specimen collection / handling, submission of specimen other than nasopharyngeal swab, presence of viral mutation(s) within the areas targeted by this assay, and inadequate number of viral copies (<250 copies / mL). A negative result must be combined with clinical observations, patient history, and epidemiological information. Fact Sheet for Patients:   StrictlyIdeas.no Fact Sheet for Healthcare Providers: BankingDealers.co.za This test is not yet approved or cleared  by the [place] FDA and has been authorized for detection and/or diagnosis of SARS-CoV-2 by FDA under an Emergency Use Authorization (EUA).  This EUA will remain in effect (meaning this test can be used) for the duration of the COVID-19 declaration under Section 564(b)(1) of the Act, 21 U.S.C. section 360bbb-3(b)(1), unless the authorization is terminated or revoked  sooner. Performed at [facility], [place] [address], [place], [place] [address]      Radiology Studies:  ECHOCARDIOGRAM COMPLETE  Result Date: 01/13/2020    ECHOCARDIOGRAM REPORT   Patient Name:   Bradley [name] [facility] Date of Exam: 01/13/2020 Medical Rec #:  [id number]       Height:       71.0 in Accession #:    [id number]      Weight:       211.0 lb Date of Birth:  March 22, 1946       BSA:          2.157 m Patient Age:    39 years        BP:           117/65 mmHg Patient Gender: M               HR:           81 bpm. Exam Location:  Inpatient Procedure: 2D Echo, Cardiac Doppler and Color Doppler Indications:    Syncope 780.2 / R55  History:        Patient has prior history of Echocardiogram examinations, most                 recent 08/16/2019. CAD, Arrythmias:Atrial Fibrillation and RBBB,                 Signs/Symptoms:Syncope; Risk Factors:Hypertension, Diabetes,                 Dyslipidemia and Former Smoker. PVD.  Sonographer:    Vickie Epley RDCS Referring Phys: [address] [place]  1. Inferior basal hypokinesis . Left ventricular ejection fraction, by estimation, is 55%. The left ventricle has normal function. The left ventricle has no regional wall motion abnormalities. Left ventricular diastolic parameters are consistent with Grade I diastolic dysfunction (impaired relaxation).  2. Right ventricular systolic function was not well visualized. The right ventricular size is not well visualized.  3. The mitral valve is normal in structure. Trivial mitral valve regurgitation. No evidence of mitral stenosis.  4. The aortic valve was not well visualized. Aortic valve regurgitation is not visualized. Moderate sclerosis and calcification without stenosis.  5. The inferior vena cava is normal in size with greater than 50% respiratory variability, suggesting right atrial pressure of 3 mmHg. FINDINGS  Left Ventricle: Inferior basal hypokinesis. Left ventricular ejection fraction, by estimation, is 55%. The  left ventricle has normal function. The left ventricle has no regional wall motion abnormalities. The left ventricular internal cavity size was small. There is no left ventricular hypertrophy. Left ventricular diastolic parameters are consistent with Grade I diastolic dysfunction (impaired relaxation). Right Ventricle: The right ventricular size is  not well visualized. Right vetricular wall thickness was not assessed. Right ventricular systolic function was not well visualized. Left Atrium: Left atrial size was normal in size. Right Atrium: Right atrial size was normal in size. Pericardium: There is no evidence of pericardial effusion. Mitral Valve: The mitral valve is normal in structure. There is mild thickening of the mitral valve leaflet(s). There is mild calcification of the mitral valve leaflet(s). Normal mobility of the mitral valve leaflets. Mild mitral annular calcification. Trivial mitral valve regurgitation. No evidence of mitral valve stenosis. Tricuspid Valve: The tricuspid valve is normal in structure. Tricuspid valve regurgitation is mild . No evidence of tricuspid stenosis. Aortic Valve: The aortic valve was not well visualized. Aortic valve regurgitation is not visualized. Moderate sclerosis and calcification without stenosis. Pulmonic Valve: The pulmonic valve was normal in structure. Pulmonic valve regurgitation is not visualized. No evidence of pulmonic stenosis. Aorta: The aortic root is normal in size and structure. Venous: The inferior vena cava is normal in size with greater than 50% respiratory variability, suggesting right atrial pressure of 3 mmHg. IAS/Shunts: No atrial level shunt detected by color flow Doppler.  LEFT VENTRICLE PLAX 2D LVOT diam:     2.50 cm      Diastology LV SV:         102          LV e' lateral:   8.38 cm/s LV SV Index:   47           LV E/e' lateral: 9.6 LVOT Area:     4.91 cm     LV e' medial:    6.31 cm/s                             LV E/e' medial:  12.7  LV  Volumes (MOD) LV vol d, MOD A2C: 90.1 ml LV vol d, MOD A4C: 142.0 ml LV vol s, MOD A2C: 46.6 ml LV vol s, MOD A4C: 66.4 ml LV SV MOD A2C:     43.5 ml LV SV MOD A4C:     142.0 ml LV SV MOD BP:      56.5 ml RIGHT VENTRICLE RV S prime:     8.35 cm/s TAPSE (M-mode): 1.0 cm LEFT ATRIUM             Index       RIGHT ATRIUM           Index LA Vol (A2C):   24.1 ml 11.17 ml/m RA Area:     13.30 cm LA Vol (A4C):   21.3 ml 9.87 ml/m  RA Volume:   28.00 ml  12.98 ml/m LA Biplane Vol: 24.9 ml 11.54 ml/m  AORTIC VALVE LVOT Vmax:   103.00 cm/s LVOT Vmean:  66.800 cm/s LVOT VTI:    0.208 m  AORTA Ao Root diam: 3.40 cm MITRAL VALVE MV Area (PHT): 2.21 cm     SHUNTS MV Decel Time: 343 msec     Systemic VTI:  0.21 m MV E velocity: 80.10 cm/s   Systemic Diam: 2.50 cm MV A velocity: 114.00 cm/s MV E/A ratio:  0.70 Jenkins Rouge MD Electronically signed by Jenkins Rouge MD Signature Date/Time: 01/13/2020/1:10:01 PM    Final    VAS US CAROTID  Result Date: 01/13/2020 Carotid Arterial Duplex Study Indications:       Syncope and left endarterectomy. Comparison Study:  08/07/19 right 40-59 left 1-39 Performing Technologist: June Leap RDMS,  RVT  Examination Guidelines: A complete evaluation includes B-mode imaging, spectral Doppler, color Doppler, and power Doppler as needed of all accessible portions of each vessel. Bilateral testing is considered an integral part of a complete examination. Limited examinations for reoccurring indications may be performed as noted.  Right Carotid Findings: +----------+--------+--------+--------+-------------------------+---------+           PSV cm/sEDV cm/sStenosisPlaque Description       Comments  +----------+--------+--------+--------+-------------------------+---------+ CCA Prox  98      11                                                 +----------+--------+--------+--------+-------------------------+---------+ CCA Distal93      14                                                  +----------+--------+--------+--------+-------------------------+---------+ ICA Prox  248     57      40-59%  heterogenous and calcificShadowing +----------+--------+--------+--------+-------------------------+---------+ ICA Mid   203     38                                                 +----------+--------+--------+--------+-------------------------+---------+ ICA Distal118     16                                                 +----------+--------+--------+--------+-------------------------+---------+ ECA       250     7       >50%    heterogenous and calcific          +----------+--------+--------+--------+-------------------------+---------+ +----------+--------+-------+----------------+-------------------+           PSV cm/sEDV cmsDescribe        Arm Pressure (mmHG) +----------+--------+-------+----------------+-------------------+ [id number]            Multiphasic, WNL                    +----------+--------+-------+----------------+-------------------+ +---------+--------+--+--------+--+---------+ VertebralPSV cm/s55EDV cm/s13Antegrade +---------+--------+--+--------+--+---------+  Left Carotid Findings: +----------+--------+--------+--------+------------------+--------+           PSV cm/sEDV cm/sStenosisPlaque DescriptionComments +----------+--------+--------+--------+------------------+--------+ CCA Prox  87      18                                         +----------+--------+--------+--------+------------------+--------+ CCA Distal113     12                                         +----------+--------+--------+--------+------------------+--------+ ICA Prox  69      19      1-39%   calcific                   +----------+--------+--------+--------+------------------+--------+ ICA Distal67      19                                         +----------+--------+--------+--------+------------------+--------+  ECA       96       14                                         +----------+--------+--------+--------+------------------+--------+ +----------+--------+--------+----------------+-------------------+           PSV cm/sEDV cm/sDescribe        Arm Pressure (mmHG) +----------+--------+--------+----------------+-------------------+ [id number]             Multiphasic, WNL                    +----------+--------+--------+----------------+-------------------+ +---------+--------+--+--------+--+---------+ VertebralPSV cm/s52EDV cm/s11Antegrade +---------+--------+--+--------+--+---------+   Summary: Right Carotid: Velocities in the right ICA are consistent with a 40-59%                stenosis. Left Carotid: Velocities in the left ICA are consistent with a 1-39% stenosis.  *See table(s) above for measurements and observations.     Preliminary      Scheduled Meds:   . apixaban  5 mg Oral BID  . aspirin EC  81 mg Oral Daily  . atorvastatin  40 mg Oral Daily  . cholecalciferol  500 Units Oral Daily  . insulin aspart  0-15 Units Subcutaneous TID WC  . insulin aspart  0-5 Units Subcutaneous QHS  . insulin glargine  16 Units Subcutaneous Daily  . metoprolol tartrate  50 mg Oral BID  . pantoprazole  40 mg Oral Daily  . simethicone  80 mg Oral QID  . sodium chloride flush  3 mL Intravenous Q12H    Continuous Infusions:   . sodium chloride       LOS: 0 days     Vernell Leep, MD, [place], [facility]. [facility]    To contact the attending provider between 7A-7P or the covering provider during after hours 7P-7A, please log into the web site [email or web address] and access using universal [place] password for that web site. If you do not have the password, please call the hospital operator.  01/13/2020, 5:01 PM

## 2020-01-13 NOTE — Progress Notes (Addendum)
Dr. Debara Pickett reviewed echo and if stable and Dr. Algis Liming agrees can be discharged.  We will arrange a follow up with Dr. Tamala Julian or APP.  Addendum: D/w Dr. Algis Liming - repeat orthostatics are significantly positive - echo shows small IVC. Will plan to give additional volume - fit bilateral compression stockings - ?if there is an autonomic neuropathy related to diabetes. Will need additional monitoring at least overnight.Pixie Casino, MD, [facility], [place] Director of the [facility] Diplomate of the American Board of Clinical Lipidology Attending Cardiologist  Direct Dial: [phone]  Fax: [phone]  Website:  [email or web address]

## 2020-01-13 NOTE — Progress Notes (Signed)
  Echocardiogram 2D Echocardiogram has been performed.  Bradley [name] 01/13/2020, 12:44 PM

## 2020-01-13 NOTE — Care Management Obs Status (Signed)
[place] NOTIFICATION   Patient Details  Name: Bradley [name] MRN: [id number] Date of Birth: 09-Nov-1945   Medicare Observation Status Notification Given:  Yes    Carles Collet, RN 01/13/2020, 2:14 PM

## 2020-01-13 NOTE — Progress Notes (Signed)
Progress Note  Patient Name: Bradley [name] Date of Encounter: 01/13/2020  [facility] Cardiologist: Belva Crome III, MD   Subjective   No chest pain, no lightheadedness   Inpatient Medications    Scheduled Meds: . apixaban  5 mg Oral BID  . aspirin EC  81 mg Oral Daily  . atorvastatin  40 mg Oral Daily  . cholecalciferol  500 Units Oral Daily  . insulin aspart  0-15 Units Subcutaneous TID WC  . insulin aspart  0-5 Units Subcutaneous QHS  . insulin glargine  16 Units Subcutaneous Daily  . metoprolol tartrate  50 mg Oral BID  . pantoprazole  40 mg Oral Daily  . simethicone  80 mg Oral QID  . sodium chloride flush  3 mL Intravenous Q12H   Continuous Infusions:  PRN Meds:    Vital Signs    Vitals:   01/12/20 1852 01/12/20 2014 01/13/20 0106 01/13/20 0452  BP: 105/77 104/60 121/64 117/65  Pulse: 92 92 91 89  Resp: 20 16 17 20   Temp: (!) 97.5 F (36.4 C) 98.4 F (36.9 C) 98.3 F (36.8 C) 97.7 F (36.5 C)  TempSrc: Oral Oral Oral Oral  SpO2: 100% 96% 95% 98%  Weight:  94.6 kg  95.7 kg  Height:  5\' 11"  (1.803 m)      Intake/Output Summary (Last 24 hours) at 01/13/2020 0751 Last data filed at 01/12/2020 2052 Gross per 24 hour  Intake 600 ml  Output --  Net 600 ml   Last 3 Weights 01/13/2020 01/12/2020 01/12/2020  Weight (lbs) 211 lb 208 lb 9.6 oz 204 lb  Weight (kg) 95.709 kg 94.62 kg 92.534 kg      Telemetry    SR - Personally Reviewed  ECG    No new will order - Personally Reviewed  Physical Exam   GEN: No acute distress.   Neck: No JVD Cardiac: RRR, no murmurs, rubs, or gallops.  Respiratory: Clear to auscultation bilaterally. GI: Soft, nontender, non-distended  MS: No edema; No deformity. Neuro:  Nonfocal  Psych: Normal affect   Labs    High Sensitivity Troponin:  No results for input(s): TROPONINIHS in the last 720 hours.    Chemistry Recent Labs  Lab 01/12/20 1349 01/13/20 0434  NA 137 137  K 3.4* 3.6  CL 102 99  CO2 21* 26   GLUCOSE 198* 184*  BUN 62* 61*  CREATININE 2.66* 2.88*  CALCIUM 8.6* 8.6*  PROT 6.5  --   ALBUMIN 3.4*  --   AST 23  --   ALT 24  --   ALKPHOS 55  --   BILITOT 0.9  --   GFRNONAA 22* 20*  GFRAA 26* 24*  ANIONGAP 14 12     Hematology Recent Labs  Lab 01/12/20 1349 01/13/20 0434  WBC 9.0 8.8  RBC 4.18* 3.89*  HGB 14.1 13.1  HCT 41.6 38.3*  MCV 99.5 98.5  MCH 33.7 33.7  MCHC 33.9 34.2  RDW 12.3 12.3  PLT 196 194    BNPNo results for input(s): BNP, PROBNP in the last 168 hours.   DDimer No results for input(s): DDIMER in the last 168 hours.   Radiology    No results found.  Cardiac Studies   TTE 08/16/19: 1. Left ventricular ejection fraction, by visual estimation, is 60 to  65%. The left ventricle has normal function. There is no left ventricular  hypertrophy.  2. Left ventricular diastolic parameters are consistent with Grade I  diastolic dysfunction (impaired relaxation).  3. The left ventricle has no regional wall motion abnormalities.  4. Global right ventricle has normal systolic function.The right  ventricular size is normal. No increase in right ventricular wall  thickness.  5. Left atrial size was normal.  6. Right atrial size was normal.  7. The mitral valve is normal in structure. No evidence of mitral valve  regurgitation. No evidence of mitral stenosis.  8. The tricuspid valve is normal in structure.  9. The aortic valve is normal in structure. Aortic valve regurgitation is  not visualized. Mild to moderate aortic valve sclerosis/calcification  without any evidence of aortic stenosis.  10. The pulmonic valve was normal in structure. Pulmonic valve  regurgitation is not visualized.  11. The inferior vena cava is normal in size with greater than 50%  respiratory variability, suggesting right atrial pressure of 3 mmHg.  12. The average left ventricular global longitudinal strain is -13.9 %.   Patient Profile     74 y.o. male with a  hx of CAD status post CABG in 2016 (LIMA-LAD, SVG-D2, SVG-PDA), chronic diastolic heart failure (EF 60-65% in 08/2019), PAD status post left CEA x2 and aorto-bifemoral bypass, OSA on CPAP, T2DM, now admitted with near syncope/syncope and new a fib with RBBB and recent hypotension.  Assessment & Plan    Syncope: Reported syncopal episode 4 weeks ago and near syncopal episode today.  Description of both episodes consistent with orthostatic hypotension.  Suspect his low blood pressure recently has been due to to diarrhea while continuing to take his torsemide, in addition to taking metoprolol and losartan.   -Discontinued losartan, hold torsemide -TTE -Monitor on telemetry--SR occ [facility]  Atrial fibrillation: New diagnosis.  Rates were well controlled on metoprolol on admit  He was asymptomatic.  Spontaneously converted to normal sinus rhythm in the ED. CHA2DS2-VASc score 8 (CHF, HTN, Agex2, DM, CVAx2, CAD) - Continued metoprolol - Start Eliquis for anticoagulation.  He is currently on aspirin/Plavix given his PAD history.  No recent coronary or peripheral stenting.  To avoid triple therapy, will discontinue Plavix and continue on aspirin plus Eliquis  Chronic diastolic heart failure: On torsemide 20 mg daily.  Has been having issues with diarrhea and has lost 13 pounds in last 5 weeks. -Suspect he is dry, held torsemide  CKD -4  with Cr 2.66 and now 2.88 (in [place] and prior was 2.38 , 2.34 and 2.27)  CAD: status post CABG in 2016 (LIMA-LAD, SVG-D2, SVG-PDA) -Continue ASA 81 mg daily.  Will hold Plavix since starting Eliquis as above -Continue atorvastatin 40 mg daily  -Continue metoprolol  PAD: Status post left CEA x2 and aortobifemoral bypass -Continue aspirin, statin  Hypertension: On losartan 50 mg daily and metoprolol 50 mg twice daily.  BP has been soft, down to SBP 80s to 90s at home. -Discontinue losartan as above -Continue metoprolol for rate control of atrial fibrillation -today BP  117/65 improved, continue to monitor  RBBB new will follow recheck EKG today      For questions or updates, please contact [facility] Please consult [email or web address] for contact info under        Signed, Cecilie Kicks, NP  01/13/2020, 7:51 AM

## 2020-01-13 NOTE — Progress Notes (Signed)
Carotid duplex       has been completed. Preliminary results can be found under CV proc through chart review. Sheylin Scharnhorst, BS, RDMS, RVT   

## 2020-01-13 NOTE — Care Management (Addendum)
Bene check for Eliquis pending Provided with 30 day Eliquis card

## 2020-01-13 NOTE — TOC Benefit Eligibility Note (Signed)
Transition of Care [facility]) Benefit Eligibility Note    Patient Details  Name: Bradley [name] MRN: [id number] Date of Birth: September 08, 1945   Medication/Dose: Eliquis 5mg .BID  Covered?: Yes  Tier: 3 Drug  Prescription Coverage Preferred Pharmacy: [name] with Person/Company/Phone Number:: Lyndal Pulley.  [phone]  Co-Pay: $45.00  Prior Approval: No  Deductible: (No Deductible)       Shelda Altes Phone Number: 01/13/2020, 9:06 AM

## 2020-01-14 DIAGNOSIS — E785 Hyperlipidemia, unspecified: Secondary | ICD-10-CM

## 2020-01-14 LAB — BASIC METABOLIC PANEL
Anion gap: 10 (ref 5–15)
BUN: 39 mg/dL — ABNORMAL HIGH (ref 8–23)
CO2: 26 mmol/L (ref 22–32)
Calcium: 9.1 mg/dL (ref 8.9–10.3)
Chloride: 104 mmol/L (ref 98–111)
Creatinine, Ser: 2.22 mg/dL — ABNORMAL HIGH (ref 0.61–1.24)
GFR calc Af Amer: 32 mL/min — ABNORMAL LOW (ref >60)
GFR calc non Af Amer: 28 mL/min — ABNORMAL LOW (ref >60)
Glucose, Bld: 165 mg/dL — ABNORMAL HIGH (ref 70–99)
Potassium: 3.7 mmol/L (ref 3.5–5.1)
Sodium: 140 mmol/L (ref 135–145)

## 2020-01-14 LAB — GLUCOSE, CAPILLARY
Glucose-Capillary: 151 mg/dL — ABNORMAL HIGH (ref 70–99)
Glucose-Capillary: 153 mg/dL — ABNORMAL HIGH (ref 70–99)

## 2020-01-14 MED ORDER — APIXABAN 5 MG PO TABS: 5.0000 mg | ORAL_TABLET | Freq: Two times a day (BID) | ORAL | 0 refills | Status: DC

## 2020-01-14 NOTE — Discharge Summary (Signed)
Physician Discharge Summary  Bradley [name] TJQ:[id number] DOB: [birth year]  PCP: [name], MD  Admitted from: Home Discharged to: Home  Admit date: 01/12/2020 Discharge date: 01/14/2020  Recommendations for Outpatient Follow-up:   Follow-up Information    [place] [facility] Follow up on 02/01/2020.   Specialty: Cardiology Why: at 11:15 AM with Dr. Thompson Caul nurse Practitioner Truitt Merle.  Contact information: [address], [address] [place]       [name], MD. Schedule an appointment as soon as possible for a visit in 1 week(s).   Specialty: Family Medicine Why: To be seen with repeat labs (CBC & BMP). Contact information: [address] [place] [address] [phone]        Belva Crome, MD .   Specialty: Cardiology Contact information: [phone] [address] [place] [address] [phone]            Home Health: None Equipment/Devices: None  Discharge Condition: Improved and stable CODE STATUS: Full Diet recommendation: Heart healthy & diabetic diet.  Discharge Diagnoses:  Active Problems:   Peripheral vascular disease ([place])   Essential hypertension   Hyperlipidemia   DM type 2, controlled, with complication ([name])   OSA on CPAP   PAF (paroxysmal atrial fibrillation) ([name])   Coronary artery disease involving coronary bypass graft of native heart with angina pectoris [facility])   Syncope   Brief Summary: 74 year old married male, independent, PMH of CAD s/p CABG 6389, chronic diastolic CHF, PAD s/p left CEA x2 and aortobifemoral bypass, OSA on CPAP, type II DM on insulins presented to ED due to an episode of syncope/near syncope on day of admission.  He completed a 12-minute walk on the treadmill which is his usual followed by the episode which was associated with hypotension with reported BPs of 50s by the gym staff, SBP in the low 90s by EMS.  He had similar  episode 5 weeks ago leading to a fall.  Also reported 13 pound weight loss, intermittent diarrhea, ongoing torsemide use, resumed losartan which he had held after the initial syncope 5 weeks ago.  Cardiology consulted.   Assessment & Plan:    Recurrent syncope/near syncope:  Likely related to hypotension/orthostatic hypotension which may be multifactorial due to intermittent diarrhea, ongoing diuretic use, antihypertensives and possible autonomic neuropathy from diabetes.  Briefly hydrated overnight of admission.  Repeat orthostatic vitals on 6/4 showed 35 point drop in SBP.    IV fluids were resumed and he was gently hydrated.  On day of discharge, denies orthostatic symptoms.  Orthostatic vital signs are negative.  Per RN, patient has ambulated a couple times in the hallway without symptoms.  TTE showed LVEF 37%, grade 1 diastolic dysfunction no aortic valve stenosis.  Carotid Dopplers: Right ICA 40-59% stenosis, left ICA 1-39% stenosis.  Patient did not get the right size lower extremity compression stockings yesterday but has them at time of discharge.  I reviewed in detail with rounding cardiologist today who recommended continuing to hold losartan and Demadex until outpatient follow-up with cardiology in 2 weeks.  Patient has been repeatedly counseled that he should not drive for 6 months or until cleared by his physician during outpatient follow-up and he verbalized understanding.  Atrial fibrillation: New diagnosis.  Asymptomatic.  Spontaneously converted to sinus rhythm. CHA2DS2-VASc score 8.  Continue metoprolol.  Initiated Eliquis anticoagulation.  Was on Aggrenox and Plavix given PAD history but since no recent coronary or  peripheral stenting, to avoid triple therapy complications, cardiology recommended discontinuing Plavix and Aggrenox, continuing low-dose aspirin along with newly started Eliquis.    Chronic diastolic CHF Has had issues with diarrhea and weight loss over the last 5  weeks.  Suspect ongoing dehydration.  Torsemide held until outpatient follow-up with cardiology or PCP.    Acute on stage IV chronic kidney disease: Creatinine 2.3 likely his baseline in January 2021, presented with creatinine of 2.66 which had gone up to peak of 2.88 but improved to 2.22 post hydration.  Hold nephrotoxic i.e. diuretics and ARB.    Acute kidney injury resolved after IV fluids.  CAD s/p CABG No anginal symptoms.  Continue aspirin.  Plavix discontinued due to initiation of Eliquis.  Continue statins and metoprolol.  PAD s/p left CEA x2 and aortobifemoral bypass: Continue aspirin and statins.  Essential hypertension/orthostatic hypotension: Losartan discontinued.  Continue metoprolol.    Briefly hydrated with gentle IV fluids and orthostatic hypotension has resolved.  Mildly elevated BPs but metoprolol should be okay.  Supine: 156/81, 93/min; sitting: 149/79, 94/min; standing: 169/75, 98/min and standing at 3 minutes: 1 6886, 100/min.  Hypokalemia: Replaced  Type II DM with renal complications: In the hospital patient was on reduced dose of insulins with reasonable inpatient control.  A1c 8.3 suggests poor outpatient control.  Close outpatient follow-up with PCP for management.   GERD: PPI  OSA: Continue nightly CPAP  Body mass index is 29.43 kg/m.  Diarrhea/weight loss: Unclear etiology.  Has not reported any diarrhea in the hospital.?  Self-limiting/resolved.  Outpatient follow-up with PCP if has recurrence or persistence.     Consultations:  Cardiology  Procedures:  None   Discharge Instructions  Discharge Instructions    (HEART FAILURE PATIENTS) Call MD:  Anytime you have any of the following symptoms: 1) 3 pound weight gain in 24 hours or 5 pounds in 1 week 2) shortness of breath, with or without a dry hacking cough 3) swelling in the hands, feet or stomach 4) if you have to sleep on extra pillows at night in order to breathe.   Complete by:  As directed    Call MD for:   Complete by: As directed    Recurrent passing out or feeling like passing out.   Call MD for:  difficulty breathing, headache or visual disturbances   Complete by: As directed    Call MD for:  extreme fatigue   Complete by: As directed    Call MD for:  persistant dizziness or light-headedness   Complete by: As directed    Call MD for:  persistant nausea and vomiting   Complete by: As directed    Call MD for:  severe uncontrolled pain   Complete by: As directed    Call MD for:  temperature >100.4   Complete by: As directed    Diet - low sodium heart healthy   Complete by: As directed    Diet Carb Modified   Complete by: As directed    Driving Restrictions   Complete by: As directed    No driving for 6 months or until cleared by your physician during office visit.   Increase activity slowly   Complete by: As directed        Medication List    STOP taking these medications   clopidogrel 75 MG tablet Commonly known as: PLAVIX   dipyridamole-aspirin 200-25 MG 12hr capsule Commonly known as: AGGRENOX   losartan 50 MG tablet Commonly known as: COZAAR  torsemide 20 MG tablet Commonly known as: DEMADEX     TAKE these medications   apixaban 5 MG Tabs tablet Commonly known as: ELIQUIS Take 1 tablet (5 mg total) by mouth 2 (two) times daily.   aspirin 81 MG EC tablet Take 1 tablet (81 mg total) by mouth daily.   atorvastatin 40 MG tablet Commonly known as: LIPITOR Take 40 mg by mouth daily.   Dexilant 60 MG capsule Generic drug: dexlansoprazole Take 60 mg by mouth every other day.   erythromycin ophthalmic ointment Place 1 application into the left eye daily as needed (for dry eyes).   Fiasp FlexTouch 100 UNIT/ML FlexTouch Pen Generic drug: insulin aspart Inject 18-25 Units into the skin in the morning, at noon, and at bedtime. Sliding scale as per the carbohydrate intake.   Foltanx RF 3-90.314-2-35 MG Caps Take 1 capsule by  mouth in the morning and at bedtime.   gabapentin 300 MG capsule Commonly known as: NEURONTIN Take 600 mg by mouth at bedtime.   ketoconazole 2 % shampoo Commonly known as: NIZORAL Apply 1 application topically daily as needed for irritation.   methocarbamol 500 MG tablet Commonly known as: ROBAXIN Take 500 mg by mouth every 6 (six) hours as needed for muscle spasms.   metoprolol tartrate 50 MG tablet Commonly known as: LOPRESSOR Take 1 tablet (50 mg total) by mouth 2 (two) times daily. Please call [phone] for appointment for more refills thanks. FINAL attempt What changed: additional instructions   Proctosol HC 2.5 % rectal cream Generic drug: hydrocortisone Place 1 application rectally 2 (two) times daily.   testosterone cypionate 200 MG/ML injection Commonly known as: DEPOTESTOSTERONE CYPIONATE Inject 200 mg into the muscle once a week.   Tyler Aas FlexTouch 100 UNIT/ML FlexTouch Pen Generic drug: insulin degludec Inject 22 Units into the skin daily.   VITAMIN D3 PO Take 500 Units by mouth daily.      Allergies  Allergen Reactions  . Hydrocodone Nausea Only  . Oxycodone Nausea Only  . Penicillins Rash    Has patient had a PCN reaction causing immediate rash, facial/tongue/throat swelling, SOB or lightheadedness with hypotension: NO Has patient had a PCN reaction causing severe rash involving mucus membranes or skin necrosis:NO Has patient had a PCN reaction that required hospitalization NO Has patient had a PCN reaction occurring within the last 10 years: NO If all of the above answers are "NO", then may proceed with Cephalosporin use.   . Sulfa Drugs Cross Reactors Rash      Procedures/Studies: ECHOCARDIOGRAM COMPLETE  Result Date: 01/13/2020    ECHOCARDIOGRAM REPORT   Patient Name:   Bradley [name] [facility] Date of Exam: 01/13/2020 Medical Rec #:  [id number]       Height:       71.0 in Accession #:    [id number]      Weight:       211.0 lb Date of Birth:  [birth year]        BSA:          2.157 m Patient Age:    74 years        BP:           117/65 mmHg Patient Gender: M               HR:           81 bpm. Exam Location:  Inpatient Procedure: 2D Echo, Cardiac Doppler and Color Doppler Indications:    Syncope 780.2 / R55  History:        Patient has prior history of Echocardiogram examinations, most                 recent 08/16/2019. CAD, Arrythmias:Atrial Fibrillation and RBBB,                 Signs/Symptoms:Syncope; Risk Factors:Hypertension, Diabetes,                 Dyslipidemia and Former Smoker. PVD.  Sonographer:    Vickie Epley RDCS Referring Phys: [address] [place]  1. Inferior basal hypokinesis . Left ventricular ejection fraction, by estimation, is 55%. The left ventricle has normal function. The left ventricle has no regional wall motion abnormalities. Left ventricular diastolic parameters are consistent with Grade I diastolic dysfunction (impaired relaxation).  2. Right ventricular systolic function was not well visualized. The right ventricular size is not well visualized.  3. The mitral valve is normal in structure. Trivial mitral valve regurgitation. No evidence of mitral stenosis.  4. The aortic valve was not well visualized. Aortic valve regurgitation is not visualized. Moderate sclerosis and calcification without stenosis.  5. The inferior vena cava is normal in size with greater than 50% respiratory variability, suggesting right atrial pressure of 3 mmHg. FINDINGS  Left Ventricle: Inferior basal hypokinesis. Left ventricular ejection fraction, by estimation, is 55%. The left ventricle has normal function. The left ventricle has no regional wall motion abnormalities. The left ventricular internal cavity size was small. There is no left ventricular hypertrophy. Left ventricular diastolic parameters are consistent with Grade I diastolic dysfunction (impaired relaxation). Right Ventricle: The right ventricular size is not well visualized. Right  vetricular wall thickness was not assessed. Right ventricular systolic function was not well visualized. Left Atrium: Left atrial size was normal in size. Right Atrium: Right atrial size was normal in size. Pericardium: There is no evidence of pericardial effusion. Mitral Valve: The mitral valve is normal in structure. There is mild thickening of the mitral valve leaflet(s). There is mild calcification of the mitral valve leaflet(s). Normal mobility of the mitral valve leaflets. Mild mitral annular calcification. Trivial mitral valve regurgitation. No evidence of mitral valve stenosis. Tricuspid Valve: The tricuspid valve is normal in structure. Tricuspid valve regurgitation is mild . No evidence of tricuspid stenosis. Aortic Valve: The aortic valve was not well visualized. Aortic valve regurgitation is not visualized. Moderate sclerosis and calcification without stenosis. Pulmonic Valve: The pulmonic valve was normal in structure. Pulmonic valve regurgitation is not visualized. No evidence of pulmonic stenosis. Aorta: The aortic root is normal in size and structure. Venous: The inferior vena cava is normal in size with greater than 50% respiratory variability, suggesting right atrial pressure of 3 mmHg. IAS/Shunts: No atrial level shunt detected by color flow Doppler.  LEFT VENTRICLE PLAX 2D LVOT diam:     2.50 cm      Diastology LV SV:         102          LV e' lateral:   8.38 cm/s LV SV Index:   47           LV E/e' lateral: 9.6 LVOT Area:     4.91 cm     LV e' medial:    6.31 cm/s                             LV E/e' medial:  12.7  LV Volumes (MOD)  LV vol d, MOD A2C: 90.1 ml LV vol d, MOD A4C: 142.0 ml LV vol s, MOD A2C: 46.6 ml LV vol s, MOD A4C: 66.4 ml LV SV MOD A2C:     43.5 ml LV SV MOD A4C:     142.0 ml LV SV MOD BP:      56.5 ml RIGHT VENTRICLE RV S prime:     8.35 cm/s TAPSE (M-mode): 1.0 cm LEFT ATRIUM             Index       RIGHT ATRIUM           Index LA Vol (A2C):   24.1 ml 11.17 ml/m RA Area:      13.30 cm LA Vol (A4C):   21.3 ml 9.87 ml/m  RA Volume:   28.00 ml  12.98 ml/m LA Biplane Vol: 24.9 ml 11.54 ml/m  AORTIC VALVE LVOT Vmax:   103.00 cm/s LVOT Vmean:  66.800 cm/s LVOT VTI:    0.208 m  AORTA Ao Root diam: 3.40 cm MITRAL VALVE MV Area (PHT): 2.21 cm     SHUNTS MV Decel Time: 343 msec     Systemic VTI:  0.21 m MV E velocity: 80.10 cm/s   Systemic Diam: 2.50 cm MV A velocity: 114.00 cm/s MV E/A ratio:  0.70 Jenkins Rouge MD Electronically signed by Jenkins Rouge MD Signature Date/Time: 01/13/2020/1:10:01 PM    Final    VAS US CAROTID  Result Date: 01/13/2020 Carotid Arterial Duplex Study Indications:       Syncope and left endarterectomy. Comparison Study:  08/07/19 right 40-59 left 1-39 Performing Technologist: June Leap RDMS, RVT  Examination Guidelines: A complete evaluation includes B-mode imaging, spectral Doppler, color Doppler, and power Doppler as needed of all accessible portions of each vessel. Bilateral testing is considered an integral part of a complete examination. Limited examinations for reoccurring indications may be performed as noted.  Right Carotid Findings: +----------+--------+--------+--------+-------------------------+---------+           PSV cm/sEDV cm/sStenosisPlaque Description       Comments  +----------+--------+--------+--------+-------------------------+---------+ CCA Prox  98      11                                                 +----------+--------+--------+--------+-------------------------+---------+ CCA Distal93      14                                                 +----------+--------+--------+--------+-------------------------+---------+ ICA Prox  248     57      40-59%  heterogenous and calcificShadowing +----------+--------+--------+--------+-------------------------+---------+ ICA Mid   203     38                                                 +----------+--------+--------+--------+-------------------------+---------+  ICA Distal118     16                                                 +----------+--------+--------+--------+-------------------------+---------+  ECA       250     7       >50%    heterogenous and calcific          +----------+--------+--------+--------+-------------------------+---------+ +----------+--------+-------+----------------+-------------------+           PSV cm/sEDV cmsDescribe        Arm Pressure (mmHG) +----------+--------+-------+----------------+-------------------+ [id number]            Multiphasic, WNL                    +----------+--------+-------+----------------+-------------------+ +---------+--------+--+--------+--+---------+ VertebralPSV cm/s55EDV cm/s13Antegrade +---------+--------+--+--------+--+---------+  Left Carotid Findings: +----------+--------+--------+--------+------------------+--------+           PSV cm/sEDV cm/sStenosisPlaque DescriptionComments +----------+--------+--------+--------+------------------+--------+ CCA Prox  87      18                                         +----------+--------+--------+--------+------------------+--------+ CCA Distal113     12                                         +----------+--------+--------+--------+------------------+--------+ ICA Prox  69      19      1-39%   calcific                   +----------+--------+--------+--------+------------------+--------+ ICA Distal67      19                                         +----------+--------+--------+--------+------------------+--------+ ECA       96      14                                         +----------+--------+--------+--------+------------------+--------+ +----------+--------+--------+----------------+-------------------+           PSV cm/sEDV cm/sDescribe        Arm Pressure (mmHG) +----------+--------+--------+----------------+-------------------+ [id number]             Multiphasic, WNL                     +----------+--------+--------+----------------+-------------------+ +---------+--------+--+--------+--+---------+ VertebralPSV cm/s52EDV cm/s11Antegrade +---------+--------+--+--------+--+---------+   Summary: Right Carotid: Velocities in the right ICA are consistent with a 40-59%                stenosis. Left Carotid: Velocities in the left ICA are consistent with a 1-39% stenosis.  *See table(s) above for measurements and observations.  Electronically signed by Ruta Hinds MD on 01/13/2020 at 7:40:17 PM.    Final        Subjective: Feels better.  Denies complaints.  Ambulated without dizziness, lightheadedness, chest pain, dyspnea or palpitations.  As per RN, no acute issues reported.  Discharge Exam:  Vitals:   01/14/20 0525 01/14/20 0752 01/14/20 0900 01/14/20 1150  BP: (!) 147/89 (!) 142/75 (!) 156/81 (!) 150/83  Pulse: 84 93 93 83  Resp: 18 18 17 18   Temp: 97.9 F (36.6 C) (!) 97.5 F (36.4 C) 97.6 F (36.4 C) (!) 97.5 F (36.4 C)  TempSrc: Oral Oral Oral Oral  SpO2: 97% 97% 98% 98%  Weight: 95.7 kg  Height:        General exam: Pleasant elderly male, moderately built and overweight lying standing up comfortably at side of his bed without distress.  Oral mucosa moist. Respiratory system: Clear to auscultation. Respiratory effort normal. Cardiovascular system: S1 & S2 heard, RRR. No JVD, murmurs, rubs, gallops or clicks. No pedal edema.  Telemetry personally reviewed: Sinus rhythm with BBB morphology. Gastrointestinal system: Abdomen is nondistended, soft and nontender. No organomegaly or masses felt. Normal bowel sounds heard. Central nervous system: Alert and oriented. No focal neurological deficits. Extremities: Symmetric 5 x 5 power. Skin: No rashes, lesions or ulcers Psychiatry: Judgement and insight appear normal. Mood & affect appropriate.     The results of significant diagnostics from this hospitalization (including imaging, microbiology,  ancillary and laboratory) are listed below for reference.     Microbiology: Recent Results (from the past 240 hour(s))  SARS Coronavirus 2 by RT PCR (hospital order, performed in [facility] hospital lab) Nasopharyngeal Nasopharyngeal Swab     Status: None   Collection Time: 01/12/20  6:52 PM   Specimen: Nasopharyngeal Swab  Result Value Ref Range Status   SARS Coronavirus 2 NEGATIVE NEGATIVE Final    Comment: (NOTE) SARS-CoV-2 target nucleic acids are NOT DETECTED. The SARS-CoV-2 RNA is generally detectable in upper and lower respiratory specimens during the acute phase of infection. The lowest concentration of SARS-CoV-2 viral copies this assay can detect is 250 copies / mL. A negative result does not preclude SARS-CoV-2 infection and should not be used as the sole basis for treatment or other patient management decisions.  A negative result may occur with improper specimen collection / handling, submission of specimen other than nasopharyngeal swab, presence of viral mutation(s) within the areas targeted by this assay, and inadequate number of viral copies (<250 copies / mL). A negative result must be combined with clinical observations, patient history, and epidemiological information. Fact Sheet for Patients:   StrictlyIdeas.no Fact Sheet for Healthcare Providers: BankingDealers.co.za This test is not yet approved or cleared  by the [place] FDA and has been authorized for detection and/or diagnosis of SARS-CoV-2 by FDA under an Emergency Use Authorization (EUA).  This EUA will remain in effect (meaning this test can be used) for the duration of the COVID-19 declaration under Section 564(b)(1) of the Act, 21 U.S.C. section 360bbb-3(b)(1), unless the authorization is terminated or revoked sooner. Performed at [facility], [place] [address], [place], [place] [address]      Labs: CBC: Recent Labs  Lab 01/12/20 1349  01/13/20 0434  WBC 9.0 8.8  HGB 14.1 13.1  HCT 41.6 38.3*  MCV 99.5 98.5  PLT 196 384    Basic Metabolic Panel: Recent Labs  Lab 01/12/20 1349 01/13/20 0434 01/14/20 0809  NA 137 137 140  K 3.4* 3.6 3.7  CL 102 99 104  CO2 21* 26 26  GLUCOSE 198* 184* 165*  BUN 62* 61* 39*  CREATININE 2.66* 2.88* 2.22*  CALCIUM 8.6* 8.6* 9.1    Liver Function Tests: Recent Labs  Lab 01/12/20 1349  AST 23  ALT 24  ALKPHOS 55  BILITOT 0.9  PROT 6.5  ALBUMIN 3.4*    CBG: Recent Labs  Lab 01/13/20 1151 01/13/20 1616 01/13/20 2215 01/14/20 0747 01/14/20 1148  GLUCAP 198* 237* 165* 151* 153*    Hgb A1c Recent Labs    01/13/20 0434  HGBA1C 8.3*     Urinalysis    Component Value Date/Time   COLORURINE YELLOW 01/13/2020  [place] 01/13/2020 1709   LABSPEC 1.008 01/13/2020 1709   PHURINE 6.0 01/13/2020 1709   GLUCOSEU >=500 (A) 01/13/2020 1709   HGBUR NEGATIVE 01/13/2020 1709   BILIRUBINUR NEGATIVE 01/13/2020 1709   KETONESUR NEGATIVE 01/13/2020 1709   PROTEINUR NEGATIVE 01/13/2020 1709   UROBILINOGEN 0.2 05/21/2015 1331   NITRITE NEGATIVE 01/13/2020 1709   LEUKOCYTESUR NEGATIVE 01/13/2020 1709    Patient declined my offer to speak with his family to update care or answer questions  Time coordinating discharge: 25 minutes  SIGNED:  Vernell Leep, MD, FACP, [facility]. [facility]  To contact the attending provider between 7A-7P or the covering provider during after hours 7P-7A, please log into the web site [email or web address] and access using universal [place] password for that web site. If you do not have the password, please call the hospital operator.

## 2020-01-14 NOTE — Progress Notes (Signed)
Dr Lysbeth Penner note reviewed. Patient was for possible discharge yesterday but significant changes with orthostatic vital signs and ongoing symptoms. Diuretic has been held, losartan stopped. Given some IVFs yesterday. Will need lower extremity compression stockings. If ongoing symptoms can start midodrine 2.5mg  tid, with his diastolic HF would look to avoid florinef. Any escalation or titration of therapy would be based on symptoms and not orthostatic changes alone on vitals. Will ask nursing to ambulate patient and document symptoms.       Carlyle Dolly MD

## 2020-01-14 NOTE — Progress Notes (Signed)
Pt walked the entirety of the hallway without difficulty. No dizziness noted. Cont to monitor. Carroll Kinds RN

## 2020-01-17 DIAGNOSIS — E1142 Type 2 diabetes mellitus with diabetic polyneuropathy: Secondary | ICD-10-CM | POA: Diagnosis not present

## 2020-01-17 DIAGNOSIS — I4891 Unspecified atrial fibrillation: Secondary | ICD-10-CM | POA: Diagnosis not present

## 2020-01-17 DIAGNOSIS — R55 Syncope and collapse: Secondary | ICD-10-CM | POA: Diagnosis not present

## 2020-01-17 DIAGNOSIS — I10 Essential (primary) hypertension: Secondary | ICD-10-CM | POA: Diagnosis not present

## 2020-01-17 DIAGNOSIS — D6869 Other thrombophilia: Secondary | ICD-10-CM | POA: Diagnosis not present

## 2020-01-17 DIAGNOSIS — Z09 Encounter for follow-up examination after completed treatment for conditions other than malignant neoplasm: Secondary | ICD-10-CM | POA: Diagnosis not present

## 2020-01-17 DIAGNOSIS — E860 Dehydration: Secondary | ICD-10-CM | POA: Diagnosis not present

## 2020-01-17 DIAGNOSIS — E7800 Pure hypercholesterolemia, unspecified: Secondary | ICD-10-CM | POA: Diagnosis not present

## 2020-01-25 NOTE — Progress Notes (Signed)
CARDIOLOGY OFFICE NOTE  Date:  02/01/2020    Bradley [name] Date of Birth: July 24, 1946 Medical Record #[id number]  PCP:  [name], MD  Cardiologist:  Tamala Julian   Chief Complaint  Patient presents with  . Follow-up    History of Present Illness: Bradley [name] is a 74 y.o. male who presents today for a post hospital visit. Seen for Dr. Tamala Julian.   He has a history of known CAD with CABG x3V in 2016 per EBG, chronic combined systolic and diastolic heart failure, HTN, HLD, OSA, PAD with L CEA x 2and prior aorto-bifem bypass, OSA on CPAP,and DMT2. Did have a prior stroke at age 3.   Seen back in January by Dr. Tamala Julian - having chest pain and SOB. Echo with normal EF. Felt to have diastolic HF.   In the hospital earlier this month with near syncope/syncope - echo was updated - basically unchanged. He had marked hypotension with reported BP in the 48J systolic while at the gym - had had prior fall - 13 pound weight loss - not really intentional, intermittent diarrhea with ongoing torsemide use. ARB and Torsemide were held until seen in follow up. He was told to not drive for 6 months. Was found to have new AF - converted spontaneously to NSR - Eliquis was started. Aggrenox and Plavix were stopped to avoid triple therapy complications. Low dose aspirin was continued. Was to have compression stockings placed.   The patient does not have symptoms concerning for COVID-19 infection (fever, chills, cough, or new shortness of breath).   Comes in today. Here alone. His weight loss has leveled off. Says he feels the best he has in the past several months. He saw Renal on Monday - apparently BP was up - they wanted to add medicines - he declined - really wants [place] to be the "gate keeper" for him - he remains just on beta blocker therapy. Off HCTZ, torsemide and Losartan. He feels like Losartan totally messed up his GI system - this has improved since discharge and not resuming. Checking BP at  home - does have a wrist cuff - seems to have fair control at home. No chest pain. Not short of breath. Wearing compression stockings. Minimal swelling. Restricting his salt. Saw PCP who gave him the ok to drive.   Past Medical History:  Diagnosis Date  . Anemia   . Arthritis   . Carotid artery occlusion    a. Carotid US 10/16: RICA 60-79%, L CEA patent with 1-39% stenosis  . Coronary artery disease    a. Myoview 9/16:  EF 52%, inferior fixed defect consistent with diaphragmatic attenuation, intermediate risk secondary to poor exercise tolerance and symptoms during stress;  b. LHC 05/17/2015 90% mid LAD, 80% ost D2, 75% mid RCA, 35% prox RCA. >> S/p CABG  . Diabetes mellitus age 39  . Diverticulitis   . GERD (gastroesophageal reflux disease)   . H/O hiatal hernia   . History of echocardiogram    a. Echo 9/16: GLS -15.2%, EF 85-63%, grade 1 diastolic dysfunction, normal wall motion, aortic sclerosis, dilated aortic root 39 mm, atrial septal lipomatous hypertrophy  . Hyperlipidemia   . Hypertension   . Irritable bowel syndrome (IBS)   . Myocardial infarction [facility])    silent inferior MI; patient denies MI history (03/17/13)   . Neuropathy 2013  . Osteoporosis 2013  . PAF (paroxysmal atrial fibrillation) ([place])    post CABG; Amiodarone stopped 2/2 wheezing;   .  Peripheral vascular disease ([place])   . Reflux esophagitis   . Sleep apnea    uses CPAP  . Stroke [facility]) 1987   Right brain stroke  . Wears glasses     Past Surgical History:  Procedure Laterality Date  . CARDIAC CATHETERIZATION N/A 05/17/2015   Procedure: Left Heart Cath and Coronary Angiography;  Surgeon: Belva Crome, MD;  Location: [place] CV LAB;  Service: Cardiovascular;  Laterality: N/A;  . CAROTID ENDARTERECTOMY  1994 & redo 2001   Left  . COLONOSCOPY W/ BIOPSIES AND POLYPECTOMY    . CORONARY ARTERY BYPASS GRAFT N/A 05/23/2015   Procedure: CORONARY ARTERY BYPASS GRAFTING (CABG) x 3 (LIMA to LAD, SVG to DIAGONAL 2,  SVG to PDA) with Endoscopic Vein Harvesting from right greater saphenous vein;  Surgeon: Grace Isaac, MD;  Location: [place];  Service: Open Heart Surgery;  Laterality: N/A;  . FRACTURE SURGERY  2013   Right   foo  t X's 2  . ILIAC ARTERY STENT    . LAPAROSCOPIC CHOLECYSTECTOMY  09/11/2016  . POSTERIOR LUMBAR FUSION  Aug. 14, 2014   Level 1  . PR VEIN BYPASS GRAFT,AORTO-FEM-POP  1998  . [place] SURGERY  2014  . TEE WITHOUT CARDIOVERSION N/A 05/23/2015   Procedure: TRANSESOPHAGEAL ECHOCARDIOGRAM (TEE);  Surgeon: Grace Isaac, MD;  Location: [place];  Service: Open Heart Surgery;  Laterality: N/A;  . TONSILLECTOMY    . TRIGGER FINGER RELEASE Right 07/14/2017   Procedure: RIGHT LONG FINGER TRIGGER RELEASE;  Surgeon: Milly Jakob, MD;  Location: [place];  Service: Orthopedics;  Laterality: Right;     Medications: Current Meds  Medication Sig  . amLODipine (NORVASC) 2.5 MG tablet Take 1 tablet (2.5 mg total) by mouth daily.  Marland Kitchen apixaban (ELIQUIS) 5 MG TABS tablet Take 1 tablet (5 mg total) by mouth 2 (two) times daily.  Marland Kitchen aspirin EC 81 MG tablet Take 81 mg by mouth daily. Swallow whole.  Marland Kitchen atorvastatin (LIPITOR) 40 MG tablet Take 40 mg by mouth daily.    . Cholecalciferol (VITAMIN D3 PO) Take 500 Units by mouth daily.  Marland Kitchen dexlansoprazole (DEXILANT) 60 MG capsule Take 60 mg by mouth every other day.   Marland Kitchen FIASP FLEXTOUCH 100 UNIT/ML SOPN Inject 20-25 Units into the skin in the morning, at noon, and at bedtime. Sliding scale as per the carbohydrate intake.  . gabapentin (NEURONTIN) 300 MG capsule Take 600 mg by mouth at bedtime.   . hydrocortisone (PROCTOSOL HC) 2.5 % rectal cream Place 1 application rectally 2 (two) times daily.  Marland Kitchen ketoconazole (NIZORAL) 2 % shampoo Apply 1 application topically daily as needed for irritation.   Marland Kitchen L-Methylfolate-Algae-B12-B6 (FOLTANX RF) 3-90.314-2-35 MG CAPS Take 1 capsule by mouth in the morning and at bedtime.   . methocarbamol  (ROBAXIN) 500 MG tablet Take 500 mg by mouth every 6 (six) hours as needed for muscle spasms.   . metoprolol tartrate (LOPRESSOR) 50 MG tablet Take 1 tablet (50 mg total) by mouth 2 (two) times daily. Please call [phone] for appointment for more refills thanks. FINAL attempt  . testosterone cypionate (DEPOTESTOSTERONE CYPIONATE) 200 MG/ML injection Inject 200 mg into the muscle once a week.   . TRESIBA FLEXTOUCH 100 UNIT/ML SOPN FlexTouch Pen Inject 22-25 Units into the skin daily.   . [DISCONTINUED] apixaban (ELIQUIS) 5 MG TABS tablet Take 1 tablet (5 mg total) by mouth 2 (two) times daily.     Allergies: Allergies  Allergen Reactions  .  Hydrocodone Nausea Only  . Oxycodone Nausea Only  . Penicillins Rash    Has patient had a PCN reaction causing immediate rash, facial/tongue/throat swelling, SOB or lightheadedness with hypotension: NO Has patient had a PCN reaction causing severe rash involving mucus membranes or skin necrosis:NO Has patient had a PCN reaction that required hospitalization NO Has patient had a PCN reaction occurring within the last 10 years: NO If all of the above answers are "NO", then may proceed with Cephalosporin use.   . Sulfa Drugs Cross Reactors Rash    Social History: The patient  reports that he quit smoking about 10 years ago. His smoking use included cigarettes. He has a 80.00 pack-year smoking history. He has never used smokeless tobacco. He reports that he does not drink alcohol and does not use drugs.   Family History: The patient's family history includes Cancer (age of onset: 60) in his mother; Deep vein thrombosis in his father; Hyperlipidemia in his father and mother; Hypertension in his father; Stroke (age of onset: 63) in his father.   Review of Systems: Please see the history of present illness.   All other systems are reviewed and negative.   Physical Exam: VS:  BP 126/68   Pulse 74   Ht 5\' 11"  (1.803 m)   Wt 214 lb 12.8 oz (97.4 kg)    SpO2 97%   BMI 29.96 kg/m  .  BMI Body mass index is 29.96 kg/m.  Wt Readings from Last 3 Encounters:  02/01/20 214 lb 12.8 oz (97.4 kg)  01/14/20 210 lb 15.7 oz (95.7 kg)  08/23/19 224 lb 12.8 oz (102 kg)   BP lying is 125/75 with HR 73 BP sitting is 126/68 with HR 73 BP standing is 13069 with HR 75 BP standing at 3 minutes is 142/71 with HR 74   BP later by me is 160/80 in both arms.   General: Pleasant. Alert and in no acute distress.   Cardiac: Regular rate and rhythm. No murmurs, rubs, or gallops. Trace edema. Has compression stockings in place.  Respiratory:  Lungs are clear to auscultation bilaterally with normal work of breathing.  GI: Soft and nontender.  MS: No deformity or atrophy. Gait and ROM intact.  Skin: Warm and dry. Color is normal.  Neuro:  Strength and sensation are intact and no gross focal deficits noted.  Psych: Alert, appropriate and with normal affect.   LABORATORY DATA:  EKG:  EKG is ordered today.  Personally reviewed by me. This demonstrates NSR.  Lab Results  Component Value Date   WBC 8.8 01/13/2020   HGB 13.1 01/13/2020   HCT 38.3 (L) 01/13/2020   PLT 194 01/13/2020   GLUCOSE 165 (H) 01/14/2020   CHOL 82 06/01/2015   TRIG 158 (H) 06/01/2015   HDL 24 (L) 06/01/2015   LDLCALC 26 06/01/2015   ALT 24 01/12/2020   AST 23 01/12/2020   NA 140 01/14/2020   K 3.7 01/14/2020   CL 104 01/14/2020   CREATININE 2.22 (H) 01/14/2020   BUN 39 (H) 01/14/2020   CO2 26 01/14/2020   TSH 2.630 05/26/2015   INR WILL FOLLOW 10/21/2016   INR 1.0 10/21/2016   HGBA1C 8.3 (H) 01/13/2020       BNP (last 3 results) No results for input(s): BNP in the last 8760 hours.  ProBNP (last 3 results) Recent Labs    08/16/19 1502  PROBNP 143     Other Studies Reviewed Today:  ECHO IMPRESSIONS 01/2020  1. Inferior basal hypokinesis . Left ventricular ejection fraction, by  estimation, is 55%. The left ventricle has normal function. The left    ventricle has no regional wall motion abnormalities. Left ventricular  diastolic parameters are consistent with  Grade I diastolic dysfunction (impaired relaxation).  2. Right ventricular systolic function was not well visualized. The right  ventricular size is not well visualized.  3. The mitral valve is normal in structure. Trivial mitral valve  regurgitation. No evidence of mitral stenosis.  4. The aortic valve was not well visualized. Aortic valve regurgitation  is not visualized. Moderate sclerosis and calcification without stenosis.  5. The inferior vena cava is normal in size with greater than 50%  respiratory variability, suggesting right atrial pressure of 3 mmHg.    2D Doppler echocardiogram January 2021: IMPRESSIONS  1. Left ventricular ejection fraction, by visual estimation, is 60 to 65%. The left ventricle has normal function. There is no left ventricular hypertrophy. 2. Left ventricular diastolic parameters are consistent with Grade I diastolic dysfunction (impaired relaxation). 3. The left ventricle has no regional wall motion abnormalities. 4. Global right ventricle has normal systolic function.The right ventricular size is normal. No increase in right ventricular wall thickness. 5. Left atrial size was normal. 6. Right atrial size was normal. 7. The mitral valve is normal in structure. No evidence of mitral valve regurgitation. No evidence of mitral stenosis. 8. The tricuspid valve is normal in structure. 9. The aortic valve is normal in structure. Aortic valve regurgitation is not visualized. Mild to moderate aortic valve sclerosis/calcification without any evidence of aortic stenosis. 10. The pulmonic valve was normal in structure. Pulmonic valve regurgitation is not visualized. 11. The inferior vena cava is normal in size with greater than 50% respiratory variability, suggesting right atrial pressure of 3 mmHg. 12. The average left ventricular global  longitudinal strain is -13.9 %.   ASSESSMENT & PLAN:    1. Recent admission for near syncope - in the setting of dehydration/diarrhea/diuretic use and weight loss - he has improved clinically. Not orthostatic here in the clinic today. He is feeling good.   2. New AF - on Lopressor and Eliquis - remains in NSR. He did get what sounds like the Alive Cor to monitor rhythm at home.   3. Anticoagulation - no problems noted. CBC today.   4. Chronic diastolic HF - mild swelling. Will add back Torsemide but just prn.   5. CAD with prior CABG - no active chest pain - would favor continued medical therapy with beta blocker/statin/aspirin and CV risk factor modification.   6. PAD - not discussed.   7. HTN - BP is up by me - was up at Renal visit on Monday - he does not wish to go back to Losartan due to GI upset which has resolved. Will start with low dose Norvasc - he is to monitor his BP.  8. HLD - on statin  9. CKD - rechecking lab today.   10. DM - per PCP  Current medicines are reviewed with the patient today.  The patient does not have concerns regarding medicines other than what has been noted above.  The following changes have been made:  See above.  Labs/ tests ordered today include:    Orders Placed This Encounter  Procedures  . Basic metabolic panel  . CBC  . EKG 12-Lead     Disposition:   FU with Dr. Tamala Julian as planned next month. Lab today. Adding low dose Norvasc  and adding back prn Torsemide for swelling/weight gain only. Overall, he is felt to be doing well. Remains in NSR at this time.     Patient is agreeable to this plan and will call if any problems develop in the interim.   SignedTruitt Merle, NP  02/01/2020 12:41 PM  [facility] [address] [place], [place]  [address] Phone: [phone] Fax: [phone]

## 2020-01-30 DIAGNOSIS — E871 Hypo-osmolality and hyponatremia: Secondary | ICD-10-CM | POA: Diagnosis not present

## 2020-01-30 DIAGNOSIS — I639 Cerebral infarction, unspecified: Secondary | ICD-10-CM | POA: Diagnosis not present

## 2020-01-30 DIAGNOSIS — N183 Chronic kidney disease, stage 3 unspecified: Secondary | ICD-10-CM | POA: Diagnosis not present

## 2020-01-30 DIAGNOSIS — R809 Proteinuria, unspecified: Secondary | ICD-10-CM | POA: Diagnosis not present

## 2020-01-30 DIAGNOSIS — G4733 Obstructive sleep apnea (adult) (pediatric): Secondary | ICD-10-CM | POA: Diagnosis not present

## 2020-01-30 DIAGNOSIS — N179 Acute kidney failure, unspecified: Secondary | ICD-10-CM | POA: Diagnosis not present

## 2020-01-30 DIAGNOSIS — E785 Hyperlipidemia, unspecified: Secondary | ICD-10-CM | POA: Diagnosis not present

## 2020-01-30 DIAGNOSIS — E1122 Type 2 diabetes mellitus with diabetic chronic kidney disease: Secondary | ICD-10-CM | POA: Diagnosis not present

## 2020-01-30 DIAGNOSIS — I2510 Atherosclerotic heart disease of native coronary artery without angina pectoris: Secondary | ICD-10-CM | POA: Diagnosis not present

## 2020-01-30 DIAGNOSIS — I129 Hypertensive chronic kidney disease with stage 1 through stage 4 chronic kidney disease, or unspecified chronic kidney disease: Secondary | ICD-10-CM | POA: Diagnosis not present

## 2020-02-01 ENCOUNTER — Other Ambulatory Visit: Payer: Self-pay

## 2020-02-01 ENCOUNTER — Ambulatory Visit: Payer: PPO | Admitting: Nurse Practitioner

## 2020-02-01 ENCOUNTER — Encounter: Payer: Self-pay | Admitting: Nurse Practitioner

## 2020-02-01 VITALS — BP 126/68 | HR 74 | Ht 71.0 in | Wt 214.8 lb

## 2020-02-01 DIAGNOSIS — I25709 Atherosclerosis of coronary artery bypass graft(s), unspecified, with unspecified angina pectoris: Secondary | ICD-10-CM

## 2020-02-01 DIAGNOSIS — I10 Essential (primary) hypertension: Secondary | ICD-10-CM | POA: Diagnosis not present

## 2020-02-01 DIAGNOSIS — I480 Paroxysmal atrial fibrillation: Secondary | ICD-10-CM | POA: Diagnosis not present

## 2020-02-01 DIAGNOSIS — Z79899 Other long term (current) drug therapy: Secondary | ICD-10-CM | POA: Diagnosis not present

## 2020-02-01 LAB — CBC
Hematocrit: 39.6 % (ref 37.5–51.0)
Hemoglobin: 13.9 g/dL (ref 13.0–17.7)
MCH: 34.3 pg — ABNORMAL HIGH (ref 26.6–33.0)
MCHC: 35.1 g/dL (ref 31.5–35.7)
MCV: 98 fL — ABNORMAL HIGH (ref 79–97)
Platelets: 258 10*3/uL (ref 150–450)
RBC: 4.05 x10E6/uL — ABNORMAL LOW (ref 4.14–5.80)
RDW: 13.1 % (ref 11.6–15.4)
WBC: 6.1 10*3/uL (ref 3.4–10.8)

## 2020-02-01 LAB — BASIC METABOLIC PANEL
BUN/Creatinine Ratio: 12 (ref 10–24)
BUN: 21 mg/dL (ref 8–27)
CO2: 22 mmol/L (ref 20–29)
Calcium: 9.1 mg/dL (ref 8.6–10.2)
Chloride: 100 mmol/L (ref 96–106)
Creatinine, Ser: 1.82 mg/dL — ABNORMAL HIGH (ref 0.76–1.27)
GFR calc Af Amer: 41 mL/min/{1.73_m2} — ABNORMAL LOW (ref >59)
GFR calc non Af Amer: 35 mL/min/{1.73_m2} — ABNORMAL LOW (ref >59)
Glucose: 155 mg/dL — ABNORMAL HIGH (ref 65–99)
Potassium: 4.7 mmol/L (ref 3.5–5.2)
Sodium: 134 mmol/L (ref 134–144)

## 2020-02-01 MED ORDER — AMLODIPINE BESYLATE 2.5 MG PO TABS
2.5000 mg | ORAL_TABLET | Freq: Every day | ORAL | 3 refills | Status: DC
Start: 2020-02-01 — End: 2020-03-07

## 2020-02-01 MED ORDER — TORSEMIDE 20 MG PO TABS
20.0000 mg | ORAL_TABLET | Freq: Every day | ORAL | 3 refills | Status: DC | PRN
Start: 2020-02-01 — End: 2020-11-15

## 2020-02-01 MED ORDER — APIXABAN 5 MG PO TABS: 5.0000 mg | ORAL_TABLET | Freq: Two times a day (BID) | ORAL | 3 refills | Status: DC

## 2020-02-01 NOTE — Patient Instructions (Addendum)
After Visit Summary:  We will be checking the following labs today - BMET and CBC   Medication Instructions:    Continue with your current medicines. BUT  I am adding back the Torsemide 20 mg to use only as needed - this would be for weight gain of over 2 or more pounds overnight. This is at your pharmacy  I am adding Norvasc 2.5 mg to take once a day - this is at your pharmacy  I refilled the Eliquis today.    If you need a refill on your cardiac medications before your next appointment, please call your pharmacy.     Testing/Procedures To Be Arranged:  N/A  Follow-Up:   See Dr. Tamala Julian next month as planned    At [facility], you and your health needs are our priority.  As part of our continuing mission to provide you with exceptional heart care, we have created designated Provider Care Teams.  These Care Teams include your primary Cardiologist (physician) and Advanced Practice Providers (APPs -  Physician Assistants and Nurse Practitioners) who all work together to provide you with the care you need, when you need it.  Special Instructions:  . Stay safe, wash your hands for at least 20 seconds and wear a mask when needed.  . It was good to talk with you today.  Marland Kitchen Keep a check on your blood pressure for me.    Call the [place] office at [phone] if you have any questions, problems or concerns.

## 2020-02-06 DIAGNOSIS — M5033 Other cervical disc degeneration, cervicothoracic region: Secondary | ICD-10-CM | POA: Diagnosis not present

## 2020-02-06 DIAGNOSIS — M5412 Radiculopathy, cervical region: Secondary | ICD-10-CM | POA: Diagnosis not present

## 2020-02-06 DIAGNOSIS — M9901 Segmental and somatic dysfunction of cervical region: Secondary | ICD-10-CM | POA: Diagnosis not present

## 2020-02-06 DIAGNOSIS — M9902 Segmental and somatic dysfunction of thoracic region: Secondary | ICD-10-CM | POA: Diagnosis not present

## 2020-02-07 DIAGNOSIS — M25562 Pain in left knee: Secondary | ICD-10-CM | POA: Diagnosis not present

## 2020-02-08 DIAGNOSIS — E1165 Type 2 diabetes mellitus with hyperglycemia: Secondary | ICD-10-CM | POA: Diagnosis not present

## 2020-02-13 DIAGNOSIS — M9902 Segmental and somatic dysfunction of thoracic region: Secondary | ICD-10-CM | POA: Diagnosis not present

## 2020-02-13 DIAGNOSIS — M5412 Radiculopathy, cervical region: Secondary | ICD-10-CM | POA: Diagnosis not present

## 2020-02-13 DIAGNOSIS — M9901 Segmental and somatic dysfunction of cervical region: Secondary | ICD-10-CM | POA: Diagnosis not present

## 2020-02-13 DIAGNOSIS — M5033 Other cervical disc degeneration, cervicothoracic region: Secondary | ICD-10-CM | POA: Diagnosis not present

## 2020-02-14 DIAGNOSIS — E7800 Pure hypercholesterolemia, unspecified: Secondary | ICD-10-CM | POA: Diagnosis not present

## 2020-02-14 DIAGNOSIS — E1142 Type 2 diabetes mellitus with diabetic polyneuropathy: Secondary | ICD-10-CM | POA: Diagnosis not present

## 2020-02-14 DIAGNOSIS — N184 Chronic kidney disease, stage 4 (severe): Secondary | ICD-10-CM | POA: Diagnosis not present

## 2020-02-14 DIAGNOSIS — I2510 Atherosclerotic heart disease of native coronary artery without angina pectoris: Secondary | ICD-10-CM | POA: Diagnosis not present

## 2020-02-14 DIAGNOSIS — I639 Cerebral infarction, unspecified: Secondary | ICD-10-CM | POA: Diagnosis not present

## 2020-02-14 DIAGNOSIS — I10 Essential (primary) hypertension: Secondary | ICD-10-CM | POA: Diagnosis not present

## 2020-02-14 DIAGNOSIS — I4891 Unspecified atrial fibrillation: Secondary | ICD-10-CM | POA: Diagnosis not present

## 2020-02-14 DIAGNOSIS — E119 Type 2 diabetes mellitus without complications: Secondary | ICD-10-CM | POA: Diagnosis not present

## 2020-02-14 DIAGNOSIS — M8580 Other specified disorders of bone density and structure, unspecified site: Secondary | ICD-10-CM | POA: Diagnosis not present

## 2020-02-20 DIAGNOSIS — M5033 Other cervical disc degeneration, cervicothoracic region: Secondary | ICD-10-CM | POA: Diagnosis not present

## 2020-02-20 DIAGNOSIS — M9902 Segmental and somatic dysfunction of thoracic region: Secondary | ICD-10-CM | POA: Diagnosis not present

## 2020-02-20 DIAGNOSIS — M9901 Segmental and somatic dysfunction of cervical region: Secondary | ICD-10-CM | POA: Diagnosis not present

## 2020-02-20 DIAGNOSIS — M5412 Radiculopathy, cervical region: Secondary | ICD-10-CM | POA: Diagnosis not present

## 2020-02-21 DIAGNOSIS — G4733 Obstructive sleep apnea (adult) (pediatric): Secondary | ICD-10-CM | POA: Diagnosis not present

## 2020-02-22 DIAGNOSIS — R1084 Generalized abdominal pain: Secondary | ICD-10-CM | POA: Diagnosis not present

## 2020-02-22 DIAGNOSIS — K219 Gastro-esophageal reflux disease without esophagitis: Secondary | ICD-10-CM | POA: Diagnosis not present

## 2020-02-22 DIAGNOSIS — Z86010 Personal history of colonic polyps: Secondary | ICD-10-CM | POA: Diagnosis not present

## 2020-02-23 DIAGNOSIS — M1811 Unilateral primary osteoarthritis of first carpometacarpal joint, right hand: Secondary | ICD-10-CM | POA: Diagnosis not present

## 2020-02-27 DIAGNOSIS — C44319 Basal cell carcinoma of skin of other parts of face: Secondary | ICD-10-CM | POA: Diagnosis not present

## 2020-02-27 DIAGNOSIS — D485 Neoplasm of uncertain behavior of skin: Secondary | ICD-10-CM | POA: Diagnosis not present

## 2020-03-05 DIAGNOSIS — M9902 Segmental and somatic dysfunction of thoracic region: Secondary | ICD-10-CM | POA: Diagnosis not present

## 2020-03-05 DIAGNOSIS — M5412 Radiculopathy, cervical region: Secondary | ICD-10-CM | POA: Diagnosis not present

## 2020-03-05 DIAGNOSIS — M9901 Segmental and somatic dysfunction of cervical region: Secondary | ICD-10-CM | POA: Diagnosis not present

## 2020-03-05 DIAGNOSIS — M5033 Other cervical disc degeneration, cervicothoracic region: Secondary | ICD-10-CM | POA: Diagnosis not present

## 2020-03-06 NOTE — Progress Notes (Signed)
Cardiology Office Note:    Date:  03/07/2020   ID:  Bradley [name], DOB 1946/08/08, MRN [id number]  PCP:  [name], MD  Cardiologist:  Sinclair Grooms, MD   Referring MD: [name], MD   Chief Complaint  Patient presents with  . Atrial Fibrillation  . Coronary Artery Disease  . Congestive Heart Failure    History of Present Illness:    Bradley [name] is a 74 y.o. male with a hx of CAD s/p CABG x3V2016, chronic combined systolic and diastolic heart failure, HTN, HLD, OSA, PAD with L CEA x2and aorto-bifem bypass, OSA on CPAP,and DMT2 who was seen most recentlyfor evaluation of chest pain and SOB.Recent admission for near syncope related to multifactorial etiology including meds, diarrhea, & neuropathy. New onset PAF leading to Eliquis and discontinuation of plavix/aggrenos(PAD).  No recurrence of atrial fibrillation since the hospitalization for diarrhea and near syncope.  Losartan HCTZ was discontinued.  When seen after hospital discharge, amlodipine 2.5 mg/day was started because of elevated blood pressure.  At home he has noted blood pressures in the 140 to 150 mmHg range    He denies orthopnea, PND, syncope, headache, bleeding on Eliquis.  Past Medical History:  Diagnosis Date  . Acquired equinus deformity of foot 12/15/2011  . Anemia   . Arthritis   . Carotid artery occlusion    a. Carotid US 10/16: RICA 60-79%, L CEA patent with 1-39% stenosis  . Coagulation disorder ([place]) 05/25/2019  . Coronary artery disease    a. Myoview 9/16:  EF 52%, inferior fixed defect consistent with diaphragmatic attenuation, intermediate risk secondary to poor exercise tolerance and symptoms during stress;  b. LHC 05/17/2015 90% mid LAD, 80% ost D2, 75% mid RCA, 35% prox RCA. >> S/p CABG  . Coronary artery disease involving coronary bypass graft of native heart with angina pectoris ([place])    CORONARY ARTERY BYPASS GRAFTING x 3 - 05/23/2015 Left internal mammary artery to left  anterior descending  Saphenous vein graft to diagonal  Saphenous vein graft to posterior descending Endoscopic greater saphenous vein harvest right thigh   . Diabetes mellitus age 66  . Diverticulitis   . Diverticulosis of colon without hemorrhage 05/17/2015   By CT scan   . DM type 2, controlled, with complication ([place]) 7/41/6384  . Essential hypertension 04/29/2015  . GERD (gastroesophageal reflux disease)   . H/O hiatal hernia   . History of echocardiogram    a. Echo 9/16: GLS -15.2%, EF 53-64%, grade 1 diastolic dysfunction, normal wall motion, aortic sclerosis, dilated aortic root 39 mm, atrial septal lipomatous hypertrophy  . Hyperlipidemia   . Hypertension   . Irritable bowel syndrome (IBS)   . Myocardial infarction [facility])    silent inferior MI; patient denies MI history (03/17/13)   . Neuropathy 2013  . OSA on CPAP 05/30/2015  . Osteoporosis 2013  . PAF (paroxysmal atrial fibrillation) ([place])    post CABG; Amiodarone stopped 2/2 wheezing;   . Peripheral vascular disease ([place])   . Plantar fasciitis 12/15/2011  . Reflux esophagitis   . Right bundle branch block 04/30/2015  . Sleep apnea    uses CPAP  . Stenosis of right internal carotid artery with cerebral infarction ([place]) 12/09/2011   Right brain CVA 1987   . Stress fracture of metatarsal bone 02/18/2012  . Stroke [facility]) 1987   Right brain stroke  . Syncope 01/12/2020  . Type 2 diabetes mellitus with vascular disease ([place]) 05/30/2015  .  Wears glasses     Past Surgical History:  Procedure Laterality Date  . CARDIAC CATHETERIZATION N/A 05/17/2015   Procedure: Left Heart Cath and Coronary Angiography;  Surgeon: Belva Crome, MD;  Location: [facility];  Service: Cardiovascular;  Laterality: N/A;  . CAROTID ENDARTERECTOMY  1994 & redo 2001   Left  . COLONOSCOPY W/ BIOPSIES AND POLYPECTOMY    . CORONARY ARTERY BYPASS GRAFT N/A 05/23/2015   Procedure: CORONARY ARTERY BYPASS GRAFTING (CABG) x 3 (LIMA to LAD, SVG to DIAGONAL  2, SVG to PDA) with Endoscopic Vein Harvesting from right greater saphenous vein;  Surgeon: Grace Isaac, MD;  Location: [facility];  Service: Open Heart Surgery;  Laterality: N/A;  . FRACTURE SURGERY  2013   Right   foo  t X's 2  . ILIAC ARTERY STENT    . LAPAROSCOPIC CHOLECYSTECTOMY  09/11/2016  . POSTERIOR LUMBAR FUSION  Aug. 14, 2014   Level 1  . PR VEIN BYPASS GRAFT,AORTO-FEM-POP  1998  . [place] SURGERY  2014  . TEE WITHOUT CARDIOVERSION N/A 05/23/2015   Procedure: TRANSESOPHAGEAL ECHOCARDIOGRAM (TEE);  Surgeon: Grace Isaac, MD;  Location: [place];  Service: Open Heart Surgery;  Laterality: N/A;  . TONSILLECTOMY    . TRIGGER FINGER RELEASE Right 07/14/2017   Procedure: RIGHT LONG FINGER TRIGGER RELEASE;  Surgeon: Milly Jakob, MD;  Location: [place];  Service: Orthopedics;  Laterality: Right;    Current Medications: Current Meds  Medication Sig  . amLODipine (NORVASC) 2.5 MG tablet Take 1 tablet (2.5 mg total) by mouth daily.  Marland Kitchen apixaban (ELIQUIS) 5 MG TABS tablet Take 1 tablet (5 mg total) by mouth 2 (two) times daily.  Marland Kitchen aspirin EC 81 MG tablet Take 81 mg by mouth daily. Swallow whole.  Marland Kitchen atorvastatin (LIPITOR) 40 MG tablet Take 40 mg by mouth daily.    . Cholecalciferol (VITAMIN D3 PO) Take 500 Units by mouth daily.  Marland Kitchen dexlansoprazole (DEXILANT) 60 MG capsule Take 60 mg by mouth every other day.   Marland Kitchen FIASP FLEXTOUCH 100 UNIT/ML SOPN Inject 20-25 Units into the skin in the morning, at noon, and at bedtime. Sliding scale as per the carbohydrate intake.  . gabapentin (NEURONTIN) 300 MG capsule Take 600 mg by mouth at bedtime.   . hydrocortisone (PROCTOSOL HC) 2.5 % rectal cream Place 1 application rectally 2 (two) times daily.  Marland Kitchen ketoconazole (NIZORAL) 2 % shampoo Apply 1 application topically daily as needed for irritation.   Marland Kitchen L-Methylfolate-Algae-B12-B6 (FOLTANX RF) 3-90.314-2-35 MG CAPS Take 1 capsule by mouth in the morning and at bedtime.   .  methocarbamol (ROBAXIN) 500 MG tablet Take 500 mg by mouth every 6 (six) hours as needed for muscle spasms.   . metoprolol tartrate (LOPRESSOR) 50 MG tablet Take 1 tablet (50 mg total) by mouth 2 (two) times daily. Please call [phone] for appointment for more refills thanks. FINAL attempt  . testosterone cypionate (DEPOTESTOSTERONE CYPIONATE) 200 MG/ML injection Inject 200 mg into the muscle once a week.   . torsemide (DEMADEX) 20 MG tablet Take 1 tablet (20 mg total) by mouth daily as needed (swelling or weight gain of 2 or more pounds overnight.).  Marland Kitchen TRESIBA FLEXTOUCH 100 UNIT/ML SOPN FlexTouch Pen Inject 22-25 Units into the skin daily.      Allergies:   Hydrocodone, Oxycodone, Penicillins, and Sulfa drugs cross reactors   Social History   Socioeconomic History  . Marital status: Married    Spouse name: Not  on file  . Number of children: Not on file  . Years of education: Not on file  . Highest education level: Not on file  Occupational History  . Not on file  Tobacco Use  . Smoking status: Former Smoker    Packs/day: 2.00    Years: 40.00    Pack years: 80.00    Types: Cigarettes    Quit date: 03/26/2009    Years since quitting: 10.9  . Smokeless tobacco: Never Used  Vaping Use  . Vaping Use: Never used  Substance and Sexual Activity  . Alcohol use: No  . Drug use: No  . Sexual activity: Not on file  Other Topics Concern  . Not on file  Social History Narrative  . Not on file   Social Determinants of Health   Financial Resource Strain:   . Difficulty of Paying Living Expenses:   Food Insecurity:   . Worried About Charity fundraiser in the Last Year:   . Arboriculturist in the Last Year:   Transportation Needs:   . Film/video editor (Medical):   Marland Kitchen Lack of Transportation (Non-Medical):   Physical Activity:   . Days of Exercise per Week:   . Minutes of Exercise per Session:   Stress:   . Feeling of Stress :   Social Connections:   . Frequency of  Communication with Friends and Family:   . Frequency of Social Gatherings with Friends and Family:   . Attends Religious Services:   . Active Member of Clubs or Organizations:   . Attends Archivist Meetings:   Marland Kitchen Marital Status:      Family History: The patient's family history includes Cancer (age of onset: 21) in his mother; Deep vein thrombosis in his father; Hyperlipidemia in his father and mother; Hypertension in his father; Stroke (age of onset: 63) in his father.  ROS:   Please see the history of present illness.    Nausea and diarrhea have resolved since discontinuation of losartan.  He feels that the medication was the precipitant.  However, he had been on losartan HCT for years.  All other systems reviewed and are negative.  EKGs/Labs/Other Studies Reviewed:    The following studies were reviewed today:  2D Doppler echocardiogram June 2021: IMPRESSIONS    1. Inferior basal hypokinesis . Left ventricular ejection fraction, by  estimation, is 55%. The left ventricle has normal function. The left  ventricle has no regional wall motion abnormalities. Left ventricular  diastolic parameters are consistent with  Grade I diastolic dysfunction (impaired relaxation).  2. Right ventricular systolic function was not well visualized. The right  ventricular size is not well visualized.  3. The mitral valve is normal in structure. Trivial mitral valve  regurgitation. No evidence of mitral stenosis.  4. The aortic valve was not well visualized. Aortic valve regurgitation  is not visualized. Moderate sclerosis and calcification without stenosis.  5. The inferior vena cava is normal in size with greater than 50%  respiratory variability, suggesting right atrial pressure of 3 mmHg.   EKG:  EKG not repeated  Recent Labs: 08/16/2019: NT-Pro BNP 143 01/12/2020: ALT 24 02/01/2020: BUN 21; Creatinine, Ser 1.82; Hemoglobin 13.9; Platelets 258; Potassium 4.7; Sodium 134  Recent  Lipid Panel    Component Value Date/Time   CHOL 82 06/01/2015 0315   TRIG 158 (H) 06/01/2015 0315   HDL 24 (L) 06/01/2015 0315   CHOLHDL 3.4 06/01/2015 0315   VLDL 32 06/01/2015  0315   LDLCALC 26 06/01/2015 0315    Physical Exam:    VS:  BP (!) 142/72   Pulse 70   Ht 5\' 11"  (1.803 m)   Wt (!) 219 lb (99.3 kg)   SpO2 97%   BMI 30.54 kg/m     Wt Readings from Last 3 Encounters:  03/07/20 (!) 219 lb (99.3 kg)  02/01/20 214 lb 12.8 oz (97.4 kg)  01/14/20 210 lb 15.7 oz (95.7 kg)    [place] blood pressure left arm 180/70 mmHg.  Right arm 155/82 mmHg GEN: Elderly. No acute distress HEENT: Normal NECK: No JVD. LYMPHATICS: No lymphadenopathy CARDIAC:  RRR without murmur, gallop, or edema. VASCULAR:  Normal Pulses. No bruits. RESPIRATORY:  Clear to auscultation without rales, wheezing or rhonchi  ABDOMEN: Soft, non-tender, non-distended, No pulsatile mass, MUSCULOSKELETAL: No deformity  SKIN: Warm and dry NEUROLOGIC:  Alert and oriented x 3 PSYCHIATRIC:  Normal affect   ASSESSMENT:    1. PAF (paroxysmal atrial fibrillation) ([place])   2. Hypotension due to hypovolemia   3. Coronary artery disease involving coronary bypass graft of native heart with angina pectoris ([place])   4. Congestive heart failure, unspecified HF chronicity, unspecified heart failure type ([place])   5. Right bundle branch block   6. Hyperlipidemia, unspecified hyperlipidemia type   7. Essential hypertension   8. High risk medication use   9. OSA on CPAP   10. PAOD (peripheral arterial occlusive disease) ([place])   11. CKD (chronic kidney disease) stage 4, GFR 15-29 ml/min ([name])   12. Educated about COVID-19 virus infection    PLAN:    In order of problems listed above:  1. Currently in normal sinus rhythm 2. Blood pressure is higher in the right arm and should be measured there.  HCTZ and losartan have been discontinued.  Increase amlodipine to 5 mg/day and monitor blood pressure as an outpatient.    3. Asymptomatic without angina 4. No clinical evidence of volume overload 5. EKG is not repeated 6. Continue high intensity Lipitor 40 mg/day 7. Blood pressure needs to be followed closely and control to less than 140/80 mmHg. 8. Continue to use CPAP. 9. Denies claudication 10. Creatinine was 1.9 while hospitalized.  Most recent creatinine was 1.82 on February 01, 2020 11. He has been vaccinated with COVID-19 vaccine.  79-month follow-up.  Monitor blood pressures at home.  Monitor blood pressures and right arm.  Amlodipine is increased to 5 mg/day.   Medication Adjustments/Labs and Tests Ordered: Current medicines are reviewed at length with the patient today.  Concerns regarding medicines are outlined above.  No orders of the defined types were placed in this encounter.  No orders of the defined types were placed in this encounter.   There are no Patient Instructions on file for this visit.   Signed, Sinclair Grooms, MD  03/07/2020 2:04 PM    [place]

## 2020-03-07 ENCOUNTER — Ambulatory Visit: Payer: PPO | Admitting: Interventional Cardiology

## 2020-03-07 ENCOUNTER — Encounter: Payer: Self-pay | Admitting: Interventional Cardiology

## 2020-03-07 ENCOUNTER — Other Ambulatory Visit: Payer: Self-pay

## 2020-03-07 VITALS — BP 142/72 | HR 70 | Ht 71.0 in | Wt 219.0 lb

## 2020-03-07 DIAGNOSIS — I509 Heart failure, unspecified: Secondary | ICD-10-CM | POA: Diagnosis not present

## 2020-03-07 DIAGNOSIS — I25709 Atherosclerosis of coronary artery bypass graft(s), unspecified, with unspecified angina pectoris: Secondary | ICD-10-CM | POA: Diagnosis not present

## 2020-03-07 DIAGNOSIS — I4510 Unspecified right bundle-branch block: Secondary | ICD-10-CM | POA: Diagnosis not present

## 2020-03-07 DIAGNOSIS — I779 Disorder of arteries and arterioles, unspecified: Secondary | ICD-10-CM | POA: Diagnosis not present

## 2020-03-07 DIAGNOSIS — E785 Hyperlipidemia, unspecified: Secondary | ICD-10-CM | POA: Diagnosis not present

## 2020-03-07 DIAGNOSIS — Z7189 Other specified counseling: Secondary | ICD-10-CM | POA: Diagnosis not present

## 2020-03-07 DIAGNOSIS — I10 Essential (primary) hypertension: Secondary | ICD-10-CM

## 2020-03-07 DIAGNOSIS — N184 Chronic kidney disease, stage 4 (severe): Secondary | ICD-10-CM | POA: Diagnosis not present

## 2020-03-07 DIAGNOSIS — G4733 Obstructive sleep apnea (adult) (pediatric): Secondary | ICD-10-CM

## 2020-03-07 DIAGNOSIS — I480 Paroxysmal atrial fibrillation: Secondary | ICD-10-CM

## 2020-03-07 DIAGNOSIS — E861 Hypovolemia: Secondary | ICD-10-CM

## 2020-03-07 DIAGNOSIS — Z79899 Other long term (current) drug therapy: Secondary | ICD-10-CM | POA: Diagnosis not present

## 2020-03-07 DIAGNOSIS — I9589 Other hypotension: Secondary | ICD-10-CM | POA: Diagnosis not present

## 2020-03-07 DIAGNOSIS — Z9989 Dependence on other enabling machines and devices: Secondary | ICD-10-CM

## 2020-03-07 MED ORDER — AMLODIPINE BESYLATE 5 MG PO TABS
5.0000 mg | ORAL_TABLET | Freq: Every day | ORAL | 3 refills | Status: DC
Start: 2020-03-07 — End: 2020-03-26

## 2020-03-07 NOTE — Patient Instructions (Signed)
Medication Instructions:  1. Increase the amlodipine to 5 mg take one tablet by mouth daily.  Monitor your blood pressure daily in the afternoon for two weeks. Call or mychart with your readings.  Be sure to check your blood pressure in your right arm.  *If you need a refill on your cardiac medications before your next appointment, please call your pharmacy*   Lab Work: None If you have labs (blood work) drawn today and your tests are completely normal, you will receive your results only by: Marland Kitchen MyChart Message (if you have MyChart) OR . A paper copy in the mail If you have any lab test that is abnormal or we need to change your treatment, we will call you to review the results.   Testing/Procedures: None   Follow-Up: At [facility], you and your health needs are our priority.  As part of our continuing mission to provide you with exceptional heart care, we have created designated Provider Care Teams.  These Care Teams include your primary Cardiologist (physician) and Advanced Practice Providers (APPs -  Physician Assistants and Nurse Practitioners) who all work together to provide you with the care you need, when you need it.   Your next appointment:   3 month(s)  The format for your next appointment:   In Person  Provider:   You may see Sinclair Grooms, MD or one of the following Advanced Practice Providers on your designated Care Team:    Truitt Merle, NP  Cecilie Kicks, NP  Kathyrn Drown, NP

## 2020-03-09 DIAGNOSIS — E1165 Type 2 diabetes mellitus with hyperglycemia: Secondary | ICD-10-CM | POA: Diagnosis not present

## 2020-03-19 DIAGNOSIS — M9901 Segmental and somatic dysfunction of cervical region: Secondary | ICD-10-CM | POA: Diagnosis not present

## 2020-03-19 DIAGNOSIS — M9902 Segmental and somatic dysfunction of thoracic region: Secondary | ICD-10-CM | POA: Diagnosis not present

## 2020-03-19 DIAGNOSIS — M5412 Radiculopathy, cervical region: Secondary | ICD-10-CM | POA: Diagnosis not present

## 2020-03-19 DIAGNOSIS — M5033 Other cervical disc degeneration, cervicothoracic region: Secondary | ICD-10-CM | POA: Diagnosis not present

## 2020-03-26 MED ORDER — AMLODIPINE BESYLATE 10 MG PO TABS: 10.0000 mg | ORAL_TABLET | Freq: Every day | ORAL | 3 refills | Status: AC

## 2020-03-27 DIAGNOSIS — M1811 Unilateral primary osteoarthritis of first carpometacarpal joint, right hand: Secondary | ICD-10-CM | POA: Diagnosis not present

## 2020-04-02 DIAGNOSIS — M5412 Radiculopathy, cervical region: Secondary | ICD-10-CM | POA: Diagnosis not present

## 2020-04-02 DIAGNOSIS — M9901 Segmental and somatic dysfunction of cervical region: Secondary | ICD-10-CM | POA: Diagnosis not present

## 2020-04-02 DIAGNOSIS — M5033 Other cervical disc degeneration, cervicothoracic region: Secondary | ICD-10-CM | POA: Diagnosis not present

## 2020-04-02 DIAGNOSIS — M9902 Segmental and somatic dysfunction of thoracic region: Secondary | ICD-10-CM | POA: Diagnosis not present

## 2020-04-09 DIAGNOSIS — E1165 Type 2 diabetes mellitus with hyperglycemia: Secondary | ICD-10-CM | POA: Diagnosis not present

## 2020-04-17 DIAGNOSIS — M9901 Segmental and somatic dysfunction of cervical region: Secondary | ICD-10-CM | POA: Diagnosis not present

## 2020-04-17 DIAGNOSIS — M5412 Radiculopathy, cervical region: Secondary | ICD-10-CM | POA: Diagnosis not present

## 2020-04-17 DIAGNOSIS — M5033 Other cervical disc degeneration, cervicothoracic region: Secondary | ICD-10-CM | POA: Diagnosis not present

## 2020-04-17 DIAGNOSIS — M9902 Segmental and somatic dysfunction of thoracic region: Secondary | ICD-10-CM | POA: Diagnosis not present

## 2020-04-26 DIAGNOSIS — Z794 Long term (current) use of insulin: Secondary | ICD-10-CM | POA: Diagnosis not present

## 2020-04-26 DIAGNOSIS — I2510 Atherosclerotic heart disease of native coronary artery without angina pectoris: Secondary | ICD-10-CM | POA: Diagnosis not present

## 2020-04-26 DIAGNOSIS — I10 Essential (primary) hypertension: Secondary | ICD-10-CM | POA: Diagnosis not present

## 2020-04-26 DIAGNOSIS — E1142 Type 2 diabetes mellitus with diabetic polyneuropathy: Secondary | ICD-10-CM | POA: Diagnosis not present

## 2020-04-30 DIAGNOSIS — E7800 Pure hypercholesterolemia, unspecified: Secondary | ICD-10-CM | POA: Diagnosis not present

## 2020-04-30 DIAGNOSIS — I4891 Unspecified atrial fibrillation: Secondary | ICD-10-CM | POA: Diagnosis not present

## 2020-04-30 DIAGNOSIS — N184 Chronic kidney disease, stage 4 (severe): Secondary | ICD-10-CM | POA: Diagnosis not present

## 2020-04-30 DIAGNOSIS — E1142 Type 2 diabetes mellitus with diabetic polyneuropathy: Secondary | ICD-10-CM | POA: Diagnosis not present

## 2020-04-30 DIAGNOSIS — E1165 Type 2 diabetes mellitus with hyperglycemia: Secondary | ICD-10-CM | POA: Diagnosis not present

## 2020-04-30 DIAGNOSIS — I639 Cerebral infarction, unspecified: Secondary | ICD-10-CM | POA: Diagnosis not present

## 2020-04-30 DIAGNOSIS — I10 Essential (primary) hypertension: Secondary | ICD-10-CM | POA: Diagnosis not present

## 2020-04-30 DIAGNOSIS — I2510 Atherosclerotic heart disease of native coronary artery without angina pectoris: Secondary | ICD-10-CM | POA: Diagnosis not present

## 2020-04-30 DIAGNOSIS — E119 Type 2 diabetes mellitus without complications: Secondary | ICD-10-CM | POA: Diagnosis not present

## 2020-04-30 DIAGNOSIS — M8580 Other specified disorders of bone density and structure, unspecified site: Secondary | ICD-10-CM | POA: Diagnosis not present

## 2020-05-01 DIAGNOSIS — M5033 Other cervical disc degeneration, cervicothoracic region: Secondary | ICD-10-CM | POA: Diagnosis not present

## 2020-05-01 DIAGNOSIS — M9901 Segmental and somatic dysfunction of cervical region: Secondary | ICD-10-CM | POA: Diagnosis not present

## 2020-05-01 DIAGNOSIS — M5412 Radiculopathy, cervical region: Secondary | ICD-10-CM | POA: Diagnosis not present

## 2020-05-01 DIAGNOSIS — M9902 Segmental and somatic dysfunction of thoracic region: Secondary | ICD-10-CM | POA: Diagnosis not present

## 2020-05-10 DIAGNOSIS — E1165 Type 2 diabetes mellitus with hyperglycemia: Secondary | ICD-10-CM | POA: Diagnosis not present

## 2020-05-14 DIAGNOSIS — M5412 Radiculopathy, cervical region: Secondary | ICD-10-CM | POA: Diagnosis not present

## 2020-05-14 DIAGNOSIS — M9902 Segmental and somatic dysfunction of thoracic region: Secondary | ICD-10-CM | POA: Diagnosis not present

## 2020-05-14 DIAGNOSIS — M9901 Segmental and somatic dysfunction of cervical region: Secondary | ICD-10-CM | POA: Diagnosis not present

## 2020-05-14 DIAGNOSIS — M5033 Other cervical disc degeneration, cervicothoracic region: Secondary | ICD-10-CM | POA: Diagnosis not present

## 2020-05-17 DIAGNOSIS — Z20822 Contact with and (suspected) exposure to covid-19: Secondary | ICD-10-CM | POA: Diagnosis not present

## 2020-05-17 DIAGNOSIS — I63239 Cerebral infarction due to unspecified occlusion or stenosis of unspecified carotid arteries: Secondary | ICD-10-CM | POA: Diagnosis not present

## 2020-05-17 DIAGNOSIS — Z882 Allergy status to sulfonamides status: Secondary | ICD-10-CM | POA: Diagnosis not present

## 2020-05-17 DIAGNOSIS — Z951 Presence of aortocoronary bypass graft: Secondary | ICD-10-CM | POA: Diagnosis not present

## 2020-05-17 DIAGNOSIS — Z9842 Cataract extraction status, left eye: Secondary | ICD-10-CM | POA: Diagnosis not present

## 2020-05-17 DIAGNOSIS — R531 Weakness: Secondary | ICD-10-CM | POA: Diagnosis not present

## 2020-05-17 DIAGNOSIS — H538 Other visual disturbances: Secondary | ICD-10-CM | POA: Diagnosis not present

## 2020-05-17 DIAGNOSIS — Z880 Allergy status to penicillin: Secondary | ICD-10-CM | POA: Diagnosis not present

## 2020-05-17 DIAGNOSIS — E876 Hypokalemia: Secondary | ICD-10-CM | POA: Diagnosis not present

## 2020-05-17 DIAGNOSIS — I6521 Occlusion and stenosis of right carotid artery: Secondary | ICD-10-CM | POA: Diagnosis not present

## 2020-05-17 DIAGNOSIS — I779 Disorder of arteries and arterioles, unspecified: Secondary | ICD-10-CM | POA: Diagnosis not present

## 2020-05-17 DIAGNOSIS — I2510 Atherosclerotic heart disease of native coronary artery without angina pectoris: Secondary | ICD-10-CM | POA: Diagnosis not present

## 2020-05-17 DIAGNOSIS — E041 Nontoxic single thyroid nodule: Secondary | ICD-10-CM | POA: Diagnosis not present

## 2020-05-17 DIAGNOSIS — Z9841 Cataract extraction status, right eye: Secondary | ICD-10-CM | POA: Diagnosis not present

## 2020-05-17 DIAGNOSIS — I5030 Unspecified diastolic (congestive) heart failure: Secondary | ICD-10-CM | POA: Diagnosis not present

## 2020-05-17 DIAGNOSIS — R200 Anesthesia of skin: Secondary | ICD-10-CM | POA: Diagnosis not present

## 2020-05-17 DIAGNOSIS — I63511 Cerebral infarction due to unspecified occlusion or stenosis of right middle cerebral artery: Secondary | ICD-10-CM | POA: Diagnosis not present

## 2020-05-17 DIAGNOSIS — I6529 Occlusion and stenosis of unspecified carotid artery: Secondary | ICD-10-CM | POA: Diagnosis not present

## 2020-05-17 DIAGNOSIS — I4510 Unspecified right bundle-branch block: Secondary | ICD-10-CM | POA: Diagnosis not present

## 2020-05-17 DIAGNOSIS — I252 Old myocardial infarction: Secondary | ICD-10-CM | POA: Diagnosis not present

## 2020-05-17 DIAGNOSIS — N189 Chronic kidney disease, unspecified: Secondary | ICD-10-CM | POA: Diagnosis not present

## 2020-05-17 DIAGNOSIS — Z7901 Long term (current) use of anticoagulants: Secondary | ICD-10-CM | POA: Diagnosis not present

## 2020-05-17 DIAGNOSIS — E1151 Type 2 diabetes mellitus with diabetic peripheral angiopathy without gangrene: Secondary | ICD-10-CM | POA: Diagnosis not present

## 2020-05-17 DIAGNOSIS — Z8673 Personal history of transient ischemic attack (TIA), and cerebral infarction without residual deficits: Secondary | ICD-10-CM | POA: Diagnosis not present

## 2020-05-17 DIAGNOSIS — G4733 Obstructive sleep apnea (adult) (pediatric): Secondary | ICD-10-CM | POA: Diagnosis not present

## 2020-05-17 DIAGNOSIS — E1122 Type 2 diabetes mellitus with diabetic chronic kidney disease: Secondary | ICD-10-CM | POA: Diagnosis not present

## 2020-05-17 DIAGNOSIS — I6523 Occlusion and stenosis of bilateral carotid arteries: Secondary | ICD-10-CM | POA: Diagnosis not present

## 2020-05-17 DIAGNOSIS — I130 Hypertensive heart and chronic kidney disease with heart failure and stage 1 through stage 4 chronic kidney disease, or unspecified chronic kidney disease: Secondary | ICD-10-CM | POA: Diagnosis not present

## 2020-05-18 ENCOUNTER — Encounter (HOSPITAL_COMMUNITY): Payer: PPO

## 2020-05-18 ENCOUNTER — Telehealth: Payer: Self-pay

## 2020-05-18 DIAGNOSIS — I6523 Occlusion and stenosis of bilateral carotid arteries: Secondary | ICD-10-CM | POA: Diagnosis not present

## 2020-05-18 NOTE — Telephone Encounter (Signed)
Patient wife called to report patient had severe headache and difficulty seeing out of right eye.Taken to [facility] and CTA found 95% blockage on R carotid. Scheduled for Monday appt with [facility] and carotid ultrasound. Patients wife verbalized understanding.

## 2020-05-21 ENCOUNTER — Encounter: Payer: Self-pay | Admitting: Surgery

## 2020-05-21 ENCOUNTER — Ambulatory Visit (HOSPITAL_COMMUNITY)
Admission: RE | Admit: 2020-05-21 | Discharge: 2020-05-21 | Disposition: A | Discharge status: Home / Self Care | Payer: PPO | Source: Ambulatory Visit | Attending: Surgery | Admitting: Surgery

## 2020-05-21 ENCOUNTER — Ambulatory Visit (INDEPENDENT_AMBULATORY_CARE_PROVIDER_SITE_OTHER): Payer: PPO | Admitting: Surgery

## 2020-05-21 ENCOUNTER — Other Ambulatory Visit: Payer: Self-pay

## 2020-05-21 VITALS — BP 131/76 | HR 72 | Temp 98.4°F | Resp 20 | Ht 71.0 in | Wt 223.0 lb

## 2020-05-21 DIAGNOSIS — I10 Essential (primary) hypertension: Secondary | ICD-10-CM | POA: Diagnosis not present

## 2020-05-21 DIAGNOSIS — I6523 Occlusion and stenosis of bilateral carotid arteries: Secondary | ICD-10-CM

## 2020-05-21 DIAGNOSIS — E860 Dehydration: Secondary | ICD-10-CM | POA: Diagnosis not present

## 2020-05-21 DIAGNOSIS — R55 Syncope and collapse: Secondary | ICD-10-CM | POA: Diagnosis not present

## 2020-05-21 DIAGNOSIS — I4891 Unspecified atrial fibrillation: Secondary | ICD-10-CM | POA: Diagnosis not present

## 2020-05-21 DIAGNOSIS — E1142 Type 2 diabetes mellitus with diabetic polyneuropathy: Secondary | ICD-10-CM | POA: Diagnosis not present

## 2020-05-21 DIAGNOSIS — Z23 Encounter for immunization: Secondary | ICD-10-CM | POA: Diagnosis not present

## 2020-05-21 DIAGNOSIS — E7800 Pure hypercholesterolemia, unspecified: Secondary | ICD-10-CM | POA: Diagnosis not present

## 2020-05-21 DIAGNOSIS — Z09 Encounter for follow-up examination after completed treatment for conditions other than malignant neoplasm: Secondary | ICD-10-CM | POA: Diagnosis not present

## 2020-05-21 DIAGNOSIS — D6869 Other thrombophilia: Secondary | ICD-10-CM | POA: Diagnosis not present

## 2020-05-21 NOTE — Progress Notes (Addendum)
[facility] [place]  Patient name: Bradley [name] MRN: [id number] DOB: 06-Jan-1946 Sex: male   REASON FOR VISIT:    Follow up  [name]:    Bradley [name] is a 74 y.o. male who is back for follow-up.  He has a long-term patient of Dr. Donnetta Hutching who has performed an aortobifemoral bypass graft for lower extremity occlusive disease in 1998 and a left carotid endarterectomy in 1994 with a redo in 2001.  He was taking his wife to [facility] for a cardiac ablation last week and on the trip down he noticed that his vision in his right eye was blurry.  While he was down there he started to shake and was found to be hypertensive with a blood pressure of 195/95.  He was sent to the emergency room.  He did not have any other localizing symptoms they did a stroke work-up which consisted of a CT scan and carotid Dopplers.  By report the CT scan showed a 95% right carotid stenosis.  Doppler studies were in the 40 to 59% range.  His symptoms have resolved.  He continues to take his Eliquis and aspirin.  Patient suffers from coronary artery disease, status post CABG on 05/23/2015.  He takes a statin for hypercholesterolemia.  He is a diabetic which is moderately well controlled.  He is a former smoker having quit in 2010.  He is medically managed for hypertension.  PAST MEDICAL HISTORY:   Past Medical History:  Diagnosis Date  . Acquired equinus deformity of foot 12/15/2011  . Anemia   . Arthritis   . Carotid artery occlusion    a. Carotid US 10/16: RICA 60-79%, L CEA patent with 1-39% stenosis  . Coagulation disorder ([place]) 05/25/2019  . Coronary artery disease    a. Myoview 9/16:  EF 52%, inferior fixed defect consistent with diaphragmatic attenuation, intermediate risk secondary to poor exercise tolerance and symptoms during stress;  b. LHC 05/17/2015 90% mid LAD, 80% ost D2, 75% mid RCA, 35% prox RCA. >> S/p CABG  . Coronary artery  disease involving coronary bypass graft of native heart with angina pectoris ([place])    CORONARY ARTERY BYPASS GRAFTING x 3 - 05/23/2015 Left internal mammary artery to left anterior descending  Saphenous vein graft to diagonal  Saphenous vein graft to posterior descending Endoscopic greater saphenous vein harvest right thigh   . Diabetes mellitus age 50  . Diverticulitis   . Diverticulosis of colon without hemorrhage 05/17/2015   By CT scan   . DM type 2, controlled, with complication ([place]) 11/17/8117  . Essential hypertension 04/29/2015  . GERD (gastroesophageal reflux disease)   . H/O hiatal hernia   . History of echocardiogram    a. Echo 9/16: GLS -15.2%, EF 14-78%, grade 1 diastolic dysfunction, normal wall motion, aortic sclerosis, dilated aortic root 39 mm, atrial septal lipomatous hypertrophy  . Hyperlipidemia   . Hypertension   . Irritable bowel syndrome (IBS)   . Myocardial infarction [facility])    silent inferior MI; patient denies MI history (03/17/13)   . Neuropathy 2013  . OSA on CPAP 05/30/2015  . Osteoporosis 2013  . PAF (paroxysmal atrial fibrillation) ([place])    post CABG; Amiodarone stopped 2/2 wheezing;   . Peripheral vascular disease ([place])   . Plantar fasciitis 12/15/2011  . Reflux esophagitis   . Right bundle branch block 04/30/2015  . Sleep apnea    uses CPAP  . Stenosis of right internal carotid  artery with cerebral infarction ([place]) 12/09/2011   Right brain CVA 1987   . Stress fracture of metatarsal bone 02/18/2012  . Stroke [facility]) 1987   Right brain stroke  . Syncope 01/12/2020  . Type 2 diabetes mellitus with vascular disease ([place]) 05/30/2015  . Wears glasses      FAMILY HISTORY:   Family History  Problem Relation Age of Onset  . Cancer Mother 25       pancreatic  . Hyperlipidemia Mother   . Stroke Father 49  . Deep vein thrombosis Father   . Hyperlipidemia Father   . Hypertension Father     SOCIAL HISTORY:   Social History   Tobacco Use  . Smoking  status: Former Smoker    Packs/day: 2.00    Years: 40.00    Pack years: 80.00    Types: Cigarettes    Quit date: 03/26/2009    Years since quitting: 11.1  . Smokeless tobacco: Never Used  Substance Use Topics  . Alcohol use: No     ALLERGIES:   Allergies  Allergen Reactions  . Hydrocodone Nausea Only  . Oxycodone Nausea Only  . Penicillins Rash    Has patient had a PCN reaction causing immediate rash, facial/tongue/throat swelling, SOB or lightheadedness with hypotension: NO Has patient had a PCN reaction causing severe rash involving mucus membranes or skin necrosis:NO Has patient had a PCN reaction that required hospitalization NO Has patient had a PCN reaction occurring within the last 10 years: NO If all of the above answers are "NO", then may proceed with Cephalosporin use.   [facility] Cross Reactors Rash     CURRENT MEDICATIONS:   Current Outpatient Medications  Medication Sig Dispense Refill  . amLODipine (NORVASC) 10 MG tablet Take 1 tablet (10 mg total) by mouth daily. 90 tablet 3  . apixaban (ELIQUIS) 5 MG TABS tablet Take 1 tablet (5 mg total) by mouth 2 (two) times daily. 180 tablet 3  . aspirin EC 81 MG tablet Take 81 mg by mouth daily. Swallow whole.    Marland Kitchen atorvastatin (LIPITOR) 40 MG tablet Take 40 mg by mouth daily.      . Cholecalciferol (VITAMIN D3 PO) Take 500 Units by mouth daily.    Marland Kitchen dexlansoprazole (DEXILANT) 60 MG capsule Take 60 mg by mouth every other day.     Marland Kitchen FIASP FLEXTOUCH 100 UNIT/ML SOPN Inject 20-25 Units into the skin in the morning, at noon, and at bedtime. Sliding scale as per the carbohydrate intake.    . gabapentin (NEURONTIN) 300 MG capsule Take 600 mg by mouth at bedtime.     . hydrocortisone (PROCTOSOL HC) 2.5 % rectal cream Place 1 application rectally 2 (two) times daily.    Marland Kitchen ketoconazole (NIZORAL) 2 % shampoo Apply 1 application topically daily as needed for irritation.     Marland Kitchen L-Methylfolate-Algae-B12-B6 (FOLTANX RF)  3-90.314-2-35 MG CAPS Take 1 capsule by mouth in the morning and at bedtime.     . methocarbamol (ROBAXIN) 500 MG tablet Take 500 mg by mouth every 6 (six) hours as needed for muscle spasms.     . metoprolol tartrate (LOPRESSOR) 50 MG tablet Take 1 tablet (50 mg total) by mouth 2 (two) times daily. Please call [phone] for appointment for more refills thanks. FINAL attempt 30 tablet 0  . testosterone cypionate (DEPOTESTOSTERONE CYPIONATE) 200 MG/ML injection Inject 200 mg into the muscle once a week.     . torsemide (DEMADEX) 20 MG tablet Take 1  tablet (20 mg total) by mouth daily as needed (swelling or weight gain of 2 or more pounds overnight.). 90 tablet 3  . TRESIBA FLEXTOUCH 100 UNIT/ML SOPN FlexTouch Pen Inject 22-25 Units into the skin daily.      No current facility-administered medications for this visit.    REVIEW OF SYSTEMS:   [X]  denotes positive finding, [ ]  denotes negative finding Cardiac  Comments:  Chest pain or chest pressure:    Shortness of breath upon exertion:    Short of breath when lying flat:    Irregular heart rhythm:        Vascular    Pain in calf, thigh, or hip brought on by ambulation:    Pain in feet at night that wakes you up from your sleep:     Blood clot in your veins:    Leg swelling:         Pulmonary    Oxygen at home:    Productive cough:     Wheezing:         Neurologic    Sudden weakness in arms or legs:     Sudden numbness in arms or legs:     Sudden onset of difficulty speaking or slurred speech:    Temporary loss of vision in one eye:  x   Problems with dizziness:         Gastrointestinal    Blood in stool:     Vomited blood:         Genitourinary    Burning when urinating:     Blood in urine:        Psychiatric    Major depression:         Hematologic    Bleeding problems:    Problems with blood clotting too easily:        Skin    Rashes or ulcers:        Constitutional    Fever or chills:      PHYSICAL EXAM:     There were no vitals filed for this visit.  GENERAL: The patient is a well-nourished male, in no acute distress. The vital signs are documented above. CARDIAC: There is a regular rate and rhythm.  PULMONARY: Non-labored respirations  MUSCULOSKELETAL: There are no major deformities or cyanosis. NEUROLOGIC: No focal weakness or paresthesias are detected. SKIN: There are no ulcers or rashes noted. PSYCHIATRIC: The patient has a normal affect.  STUDIES:   I have reviewed the following vascular studies: Right Carotid: Velocities in the right ICA are consistent with a 40-59%         stenosis. The ECA appears >50% stenosed.  MEDICAL ISSUES:   Possible right sided amaurosis fugax: I discussed with the patient that the duplex ultrasound at [facility] and here show only 40-59% stenosis.  However, his CT scan shows 95% stenosis on the right.  I told him that most likely we would need to proceed with right carotid endarterectomy, however I would like to look at his CT scan before definitively making that decision.  He is a longtime patient of Dr. Donnetta Hutching and so I will try to arrange this with Dr. Donnetta Hutching.  We went over the details of the procedure.  All questions were answered.  I will be back in touch with him once I have reviewed his CT scan.    Leia Alf, MD, FACS [facility] [phone] Pager [phone]   I was able  to review the CT scan images from [facility]. I do agree he has a high-grade lesion in the right carotid artery bifurcation which could explain his visual episode. He has plaque that extends up to the top of C2. I think the most appropriate option is to proceed with a right sided TCAR. Over the telephone I discussed with him and his wife the details of the procedure as well as the risks including stroke. We also discussed taking Plavix beginning tomorrow which would need to be continued for 1 month. He will need to be off of his Eliquis for  the procedure. I Georgina Peer try to get this done Friday pending cardiology clearance.  [name]

## 2020-05-21 NOTE — H&P (View-Only) (Signed)
[facility]ist of [place]  Patient name: JAYMZ TRAYWICK MRN: [id number] DOB: 06/22/1946 Sex: male   REASON FOR VISIT:    Follow up  [name]:    DARRON STUCK is a 74 y.o. male who is back for follow-up.  He has a long-term patient of Dr. Donnetta Hutching who has performed an aortobifemoral bypass graft for lower extremity occlusive disease in 1998 and a left carotid endarterectomy in 1994 with a redo in 2001.  He was taking his wife to [facility] for a cardiac ablation last week and on the trip down he noticed that his vision in his right eye was blurry.  While he was down there he started to shake and was found to be hypertensive with a blood pressure of 195/95.  He was sent to the emergency room.  He did not have any other localizing symptoms they did a stroke work-up which consisted of a CT scan and carotid Dopplers.  By report the CT scan showed a 95% right carotid stenosis.  Doppler studies were in the 40 to 59% range.  His symptoms have resolved.  He continues to take his Eliquis and aspirin.  Patient suffers from coronary artery disease, status post CABG on 05/23/2015.  He takes a statin for hypercholesterolemia.  He is a diabetic which is moderately well controlled.  He is a former smoker having quit in 2010.  He is medically managed for hypertension.  PAST MEDICAL HISTORY:   Past Medical History:  Diagnosis Date  . Acquired equinus deformity of foot 12/15/2011  . Anemia   . Arthritis   . Carotid artery occlusion    a. Carotid US 10/16: RICA 60-79%, L CEA patent with 1-39% stenosis  . Coagulation disorder ([place]) 05/25/2019  . Coronary artery disease    a. Myoview 9/16:  EF 52%, inferior fixed defect consistent with diaphragmatic attenuation, intermediate risk secondary to poor exercise tolerance and symptoms during stress;  b. LHC 05/17/2015 90% mid LAD, 80% ost D2, 75% mid RCA, 35% prox RCA. >> S/p CABG  . Coronary artery  disease involving coronary bypass graft of native heart with angina pectoris ([place])    CORONARY ARTERY BYPASS GRAFTING x 3 - 05/23/2015 Left internal mammary artery to left anterior descending  Saphenous vein graft to diagonal  Saphenous vein graft to posterior descending Endoscopic greater saphenous vein harvest right thigh   . Diabetes mellitus age 53  . Diverticulitis   . Diverticulosis of colon without hemorrhage 05/17/2015   By CT scan   . DM type 2, controlled, with complication ([place]) 0/27/7412  . Essential hypertension 04/29/2015  . GERD (gastroesophageal reflux disease)   . H/O hiatal hernia   . History of echocardiogram    a. Echo 9/16: GLS -15.2%, EF 87-86%, grade 1 diastolic dysfunction, normal wall motion, aortic sclerosis, dilated aortic root 39 mm, atrial septal lipomatous hypertrophy  . Hyperlipidemia   . Hypertension   . Irritable bowel syndrome (IBS)   . Myocardial infarction [facility])    silent inferior MI; patient denies MI history (03/17/13)   . Neuropathy 2013  . OSA on CPAP 05/30/2015  . Osteoporosis 2013  . PAF (paroxysmal atrial fibrillation) ([place])    post CABG; Amiodarone stopped 2/2 wheezing;   . Peripheral vascular disease ([place])   . Plantar fasciitis 12/15/2011  . Reflux esophagitis   . Right bundle branch block 04/30/2015  . Sleep apnea    uses CPAP  . Stenosis of right internal carotid  artery with cerebral infarction ([place]) 12/09/2011   Right brain CVA 1987   . Stress fracture of metatarsal bone 02/18/2012  . Stroke [facility]) 1987   Right brain stroke  . Syncope 01/12/2020  . Type 2 diabetes mellitus with vascular disease ([place]) 05/30/2015  . Wears glasses      FAMILY HISTORY:   Family History  Problem Relation Age of Onset  . Cancer Mother 76       pancreatic  . Hyperlipidemia Mother   . Stroke Father 12  . Deep vein thrombosis Father   . Hyperlipidemia Father   . Hypertension Father     SOCIAL HISTORY:   Social History   Tobacco Use  . Smoking  status: Former Smoker    Packs/day: 2.00    Years: 40.00    Pack years: 80.00    Types: Cigarettes    Quit date: 03/26/2009    Years since quitting: 11.1  . Smokeless tobacco: Never Used  Substance Use Topics  . Alcohol use: No     ALLERGIES:   Allergies  Allergen Reactions  . Hydrocodone Nausea Only  . Oxycodone Nausea Only  . Penicillins Rash    Has patient had a PCN reaction causing immediate rash, facial/tongue/throat swelling, SOB or lightheadedness with hypotension: NO Has patient had a PCN reaction causing severe rash involving mucus membranes or skin necrosis:NO Has patient had a PCN reaction that required hospitalization NO Has patient had a PCN reaction occurring within the last 10 years: NO If all of the above answers are "NO", then may proceed with Cephalosporin use.   [facility] Cross Reactors Rash     CURRENT MEDICATIONS:   Current Outpatient Medications  Medication Sig Dispense Refill  . amLODipine (NORVASC) 10 MG tablet Take 1 tablet (10 mg total) by mouth daily. 90 tablet 3  . apixaban (ELIQUIS) 5 MG TABS tablet Take 1 tablet (5 mg total) by mouth 2 (two) times daily. 180 tablet 3  . aspirin EC 81 MG tablet Take 81 mg by mouth daily. Swallow whole.    Marland Kitchen atorvastatin (LIPITOR) 40 MG tablet Take 40 mg by mouth daily.      . Cholecalciferol (VITAMIN D3 PO) Take 500 Units by mouth daily.    Marland Kitchen dexlansoprazole (DEXILANT) 60 MG capsule Take 60 mg by mouth every other day.     Marland Kitchen FIASP FLEXTOUCH 100 UNIT/ML SOPN Inject 20-25 Units into the skin in the morning, at noon, and at bedtime. Sliding scale as per the carbohydrate intake.    . gabapentin (NEURONTIN) 300 MG capsule Take 600 mg by mouth at bedtime.     . hydrocortisone (PROCTOSOL HC) 2.5 % rectal cream Place 1 application rectally 2 (two) times daily.    Marland Kitchen ketoconazole (NIZORAL) 2 % shampoo Apply 1 application topically daily as needed for irritation.     Marland Kitchen L-Methylfolate-Algae-B12-B6 (FOLTANX RF)  3-90.314-2-35 MG CAPS Take 1 capsule by mouth in the morning and at bedtime.     . methocarbamol (ROBAXIN) 500 MG tablet Take 500 mg by mouth every 6 (six) hours as needed for muscle spasms.     . metoprolol tartrate (LOPRESSOR) 50 MG tablet Take 1 tablet (50 mg total) by mouth 2 (two) times daily. Please call [phone] for appointment for more refills thanks. FINAL attempt 30 tablet 0  . testosterone cypionate (DEPOTESTOSTERONE CYPIONATE) 200 MG/ML injection Inject 200 mg into the muscle once a week.     . torsemide (DEMADEX) 20 MG tablet Take 1  tablet (20 mg total) by mouth daily as needed (swelling or weight gain of 2 or more pounds overnight.). 90 tablet 3  . TRESIBA FLEXTOUCH 100 UNIT/ML SOPN FlexTouch Pen Inject 22-25 Units into the skin daily.      No current facility-administered medications for this visit.    REVIEW OF SYSTEMS:   [X]  denotes positive finding, [ ]  denotes negative finding Cardiac  Comments:  Chest pain or chest pressure:    Shortness of breath upon exertion:    Short of breath when lying flat:    Irregular heart rhythm:        Vascular    Pain in calf, thigh, or hip brought on by ambulation:    Pain in feet at night that wakes you up from your sleep:     Blood clot in your veins:    Leg swelling:         Pulmonary    Oxygen at home:    Productive cough:     Wheezing:         Neurologic    Sudden weakness in arms or legs:     Sudden numbness in arms or legs:     Sudden onset of difficulty speaking or slurred speech:    Temporary loss of vision in one eye:  x   Problems with dizziness:         Gastrointestinal    Blood in stool:     Vomited blood:         Genitourinary    Burning when urinating:     Blood in urine:        Psychiatric    Major depression:         Hematologic    Bleeding problems:    Problems with blood clotting too easily:        Skin    Rashes or ulcers:        Constitutional    Fever or chills:      PHYSICAL EXAM:     There were no vitals filed for this visit.  GENERAL: The patient is a well-nourished male, in no acute distress. The vital signs are documented above. CARDIAC: There is a regular rate and rhythm.  PULMONARY: Non-labored respirations  MUSCULOSKELETAL: There are no major deformities or cyanosis. NEUROLOGIC: No focal weakness or paresthesias are detected. SKIN: There are no ulcers or rashes noted. PSYCHIATRIC: The patient has a normal affect.  STUDIES:   I have reviewed the following vascular studies: Right Carotid: Velocities in the right ICA are consistent with a 40-59%         stenosis. The ECA appears >50% stenosed.  MEDICAL ISSUES:   Possible right sided amaurosis fugax: I discussed with the patient that the duplex ultrasound at [facility] and here show only 40-59% stenosis.  However, his CT scan shows 95% stenosis on the right.  I told him that most likely we would need to proceed with right carotid endarterectomy, however I would like to look at his CT scan before definitively making that decision.  He is a longtime patient of Dr. Donnetta Hutching and so I will try to arrange this with Dr. Donnetta Hutching.  We went over the details of the procedure.  All questions were answered.  I will be back in touch with him once I have reviewed his CT scan.    Leia Alf, MD, FACS [facility] [phone] Pager [phone]   I was able  to review the CT scan images from [facility]. I do agree he has a high-grade lesion in the right carotid artery bifurcation which could explain his visual episode. He has plaque that extends up to the top of C2. I think the most appropriate option is to proceed with a right sided TCAR. Over the telephone I discussed with him and his wife the details of the procedure as well as the risks including stroke. We also discussed taking Plavix beginning tomorrow which would need to be continued for 1 month. He will need to be off of his Eliquis for  the procedure. I Georgina Peer try to get this done Friday pending cardiology clearance.  [name]

## 2020-05-23 ENCOUNTER — Ambulatory Visit: Payer: PPO | Admitting: Podiatry

## 2020-05-23 ENCOUNTER — Ambulatory Visit
Admission: RE | Admit: 2020-05-23 | Discharge: 2020-05-23 | Disposition: A | Discharge status: Home / Self Care | Payer: Self-pay | Source: Ambulatory Visit | Attending: Surgery | Admitting: Surgery

## 2020-05-23 ENCOUNTER — Other Ambulatory Visit (HOSPITAL_COMMUNITY): Payer: Self-pay | Admitting: Surgery

## 2020-05-23 DIAGNOSIS — I6529 Occlusion and stenosis of unspecified carotid artery: Secondary | ICD-10-CM

## 2020-05-25 ENCOUNTER — Telehealth: Payer: Self-pay

## 2020-05-25 DIAGNOSIS — C44329 Squamous cell carcinoma of skin of other parts of face: Secondary | ICD-10-CM | POA: Diagnosis not present

## 2020-05-25 DIAGNOSIS — Z85828 Personal history of other malignant neoplasm of skin: Secondary | ICD-10-CM | POA: Diagnosis not present

## 2020-05-25 DIAGNOSIS — D0439 Carcinoma in situ of skin of other parts of face: Secondary | ICD-10-CM | POA: Diagnosis not present

## 2020-05-25 DIAGNOSIS — L218 Other seborrheic dermatitis: Secondary | ICD-10-CM | POA: Diagnosis not present

## 2020-05-25 DIAGNOSIS — Z08 Encounter for follow-up examination after completed treatment for malignant neoplasm: Secondary | ICD-10-CM | POA: Diagnosis not present

## 2020-05-25 DIAGNOSIS — D485 Neoplasm of uncertain behavior of skin: Secondary | ICD-10-CM | POA: Diagnosis not present

## 2020-05-25 DIAGNOSIS — X32XXXA Exposure to sunlight, initial encounter: Secondary | ICD-10-CM | POA: Diagnosis not present

## 2020-05-25 DIAGNOSIS — L570 Actinic keratosis: Secondary | ICD-10-CM | POA: Diagnosis not present

## 2020-05-25 DIAGNOSIS — C44311 Basal cell carcinoma of skin of nose: Secondary | ICD-10-CM | POA: Diagnosis not present

## 2020-05-25 NOTE — Telephone Encounter (Signed)
Call from patient following up CT results and next steps. Stated he was told by Dr. Trula Slade to call office if he hadn't heard anything back. Pt was seen on 10/11 and discussed possible R CEA with Dr. Donnetta Hutching. Advised pt message was sent to Dr. Trula Slade to review CT and advise. Pt voiced understanding.

## 2020-05-28 DIAGNOSIS — Z0000 Encounter for general adult medical examination without abnormal findings: Secondary | ICD-10-CM | POA: Diagnosis not present

## 2020-05-28 DIAGNOSIS — Z79899 Other long term (current) drug therapy: Secondary | ICD-10-CM | POA: Diagnosis not present

## 2020-05-28 DIAGNOSIS — R079 Chest pain, unspecified: Secondary | ICD-10-CM | POA: Diagnosis not present

## 2020-05-28 DIAGNOSIS — E7800 Pure hypercholesterolemia, unspecified: Secondary | ICD-10-CM | POA: Diagnosis not present

## 2020-05-28 DIAGNOSIS — I2510 Atherosclerotic heart disease of native coronary artery without angina pectoris: Secondary | ICD-10-CM | POA: Diagnosis not present

## 2020-05-28 DIAGNOSIS — I679 Cerebrovascular disease, unspecified: Secondary | ICD-10-CM | POA: Diagnosis not present

## 2020-05-28 DIAGNOSIS — E291 Testicular hypofunction: Secondary | ICD-10-CM | POA: Diagnosis not present

## 2020-05-28 DIAGNOSIS — Z125 Encounter for screening for malignant neoplasm of prostate: Secondary | ICD-10-CM | POA: Diagnosis not present

## 2020-05-28 DIAGNOSIS — E041 Nontoxic single thyroid nodule: Secondary | ICD-10-CM | POA: Diagnosis not present

## 2020-05-28 DIAGNOSIS — K219 Gastro-esophageal reflux disease without esophagitis: Secondary | ICD-10-CM | POA: Diagnosis not present

## 2020-05-28 DIAGNOSIS — I10 Essential (primary) hypertension: Secondary | ICD-10-CM | POA: Diagnosis not present

## 2020-05-29 MED ORDER — CLOPIDOGREL BISULFATE 75 MG PO TABS: 75.0000 mg | ORAL_TABLET | Freq: Every day | ORAL | 1 refills | Status: DC

## 2020-05-29 NOTE — Addendum Note (Signed)
Addended by: Serafina Mitchell on: 05/29/2020 07:43 PM   Modules accepted: Orders

## 2020-05-30 ENCOUNTER — Encounter: Payer: Self-pay | Admitting: Podiatry

## 2020-05-30 ENCOUNTER — Telehealth: Payer: Self-pay | Admitting: Interventional Cardiology

## 2020-05-30 ENCOUNTER — Other Ambulatory Visit: Payer: Self-pay

## 2020-05-30 ENCOUNTER — Telehealth: Payer: Self-pay

## 2020-05-30 ENCOUNTER — Ambulatory Visit: Payer: PPO | Admitting: Podiatry

## 2020-05-30 DIAGNOSIS — I129 Hypertensive chronic kidney disease with stage 1 through stage 4 chronic kidney disease, or unspecified chronic kidney disease: Secondary | ICD-10-CM | POA: Diagnosis not present

## 2020-05-30 DIAGNOSIS — I639 Cerebral infarction, unspecified: Secondary | ICD-10-CM | POA: Diagnosis not present

## 2020-05-30 DIAGNOSIS — I6529 Occlusion and stenosis of unspecified carotid artery: Secondary | ICD-10-CM | POA: Diagnosis not present

## 2020-05-30 DIAGNOSIS — D689 Coagulation defect, unspecified: Secondary | ICD-10-CM | POA: Diagnosis not present

## 2020-05-30 DIAGNOSIS — E1159 Type 2 diabetes mellitus with other circulatory complications: Secondary | ICD-10-CM

## 2020-05-30 DIAGNOSIS — N179 Acute kidney failure, unspecified: Secondary | ICD-10-CM | POA: Diagnosis not present

## 2020-05-30 DIAGNOSIS — R809 Proteinuria, unspecified: Secondary | ICD-10-CM | POA: Diagnosis not present

## 2020-05-30 DIAGNOSIS — E871 Hypo-osmolality and hyponatremia: Secondary | ICD-10-CM | POA: Diagnosis not present

## 2020-05-30 DIAGNOSIS — N183 Chronic kidney disease, stage 3 unspecified: Secondary | ICD-10-CM | POA: Diagnosis not present

## 2020-05-30 DIAGNOSIS — G4733 Obstructive sleep apnea (adult) (pediatric): Secondary | ICD-10-CM | POA: Diagnosis not present

## 2020-05-30 DIAGNOSIS — E1122 Type 2 diabetes mellitus with diabetic chronic kidney disease: Secondary | ICD-10-CM | POA: Diagnosis not present

## 2020-05-30 DIAGNOSIS — E785 Hyperlipidemia, unspecified: Secondary | ICD-10-CM | POA: Diagnosis not present

## 2020-05-30 DIAGNOSIS — I2510 Atherosclerotic heart disease of native coronary artery without angina pectoris: Secondary | ICD-10-CM | POA: Diagnosis not present

## 2020-05-30 NOTE — Telephone Encounter (Signed)
Call placed to pt this morning to schedule for a R TCAR with Dr. Trula Slade on this Friday, 06/01/20. During conversation, pt mentioned that he has been having intermittent pain in left axilla area radiating across chest into back x several weeks, that worsens when leaning over or sitting up. He reports frequent belching. He denies any pain at rest or any other associated symptoms. He denies any injuries but recalls lifting a sewing machine a few weeks ago. Pt followed up with PCP this past Monday who felt symptoms could be muscle or reflux related. PCP stated he would also inform pt's cardiologist Dr. Tamala Julian. Cardiac clearance for surgery has been requested. Dr. Trula Slade has been notified of the above information and will also attempt to discuss with Dr. Tamala Julian.

## 2020-05-30 NOTE — Telephone Encounter (Signed)
   [facility] Pre-operative Risk Assessment    HEARTCARE STAFF: - Please ensure there is not already an duplicate clearance open for this procedure. - Under Visit Info/Reason for Call, type in Other and utilize the format Clearance MM/DD/YY or Clearance TBD. Do not use dashes or single digits. - If request is for dental extraction, please clarify the # of teeth to be extracted.  Request for surgical clearance:  1. What type of surgery is being performed? TCAR  2. When is this surgery scheduled? 06/01/2020  3. What type of clearance is required (medical clearance vs. Pharmacy clearance to hold med vs. Both)? Both  4. Are there any medications that need to be held prior to surgery and how long? Stop eliquis immediately  5. Practice name and name of physician performing surgery? [facility], Dr. Trula Slade  6. What is the office phone number? [phone], ask for [place] or [place]   7.   What is the office fax number? [phone]  8.   Anesthesia type (None, local, MAC, general) ? general   Selena Zobro 05/30/2020, 9:25 AM  _________________________________________________________________   (provider comments below)

## 2020-05-30 NOTE — Telephone Encounter (Signed)
Dr. Tamala Julian This patient has a history of CAD s/p CABG, CHF, IDDM and history of creatinine over 2. He reports chest pain that sounds positional, but also relieved with rest. He is also having problems with hypertensive urgency, was 195/94 on 10/17. He had a recent hospitalization with symptoms concerning for amaurosis fugax in the setting of hypertension. CT imaging revealed high-grade lesion in the right carotid artery bifurcation. We have been asked for cardiac clearance for TCAR by [facility].    According to the RCRI, he has an 11% risk of cardiac complications. Given his high risk and description of chest pain, do you recommend testing prior to surgery?  Last myoview nonischemic in 2018.

## 2020-05-30 NOTE — Progress Notes (Signed)
This patient presents the office for his annual diabetic foot exam.  This patient has been diagnosed with diabetes with vascular disease previously.  He says he is having no pain or discomfort in his feet and his nails have recently been done.  Patient does give a history of heart surgery and kidney disease.  Patient is taking insulin and Plavix.  He presents the office today for an evaluation of his diabetic feet   Vascular  Dorsalis pedis and posterior tibial pulses are weakly   palpable  B/L.  Capillary return  WNL.  Temperature gradient is  WNL.  Skin turgor  WNL  Sensorium  Senn Weinstein monofilament wire  WNL. Normal tactile sensation.  Nail Exam  Patient has normal nails with no evidence of bacterial or fungal infection.  Orthopedic  Exam  Muscle tone and muscle strength  WNL.  No limitations of motion feet  B/L.  No crepitus or joint effusion noted.  Foot type is unremarkable and digits show no abnormalities.  Bony prominences are unremarkable.  Skin  No open lesions.  Normal skin texture and turgor..  Diabetes with vascular disease.  His diabetic foot exam reveals vascular disease  of pedal pulses.  No evidence of any neuropathy feet bilaterally.  Patient to return to the office in 1 year for his next annual diabetic foot exam.  Patient says he has yearly vascular exam.  RTC 1 year   Gardiner Barefoot DPM

## 2020-05-30 NOTE — Telephone Encounter (Signed)
Patient with diagnosis of afib on Eliquis for anticoagulation.    Procedure: transcarotid artery repair Date of procedure: 06/01/20      :[id number]}  CHA2DS2-VASc Score = 8  This indicates a 10.8% annual risk of stroke. The patient's score is based upon: CHF History: 1 HTN History: 1 Diabetes History: 1 Stroke History: 2 Vascular Disease History: 1 Age Score: 2 Gender Score: 0  CrCl 69mL/min Platelet count 258K  Will need clearance from Dr Tamala Julian to hold Eliquis for 2 days due to elevated cardiovascular risk.

## 2020-05-31 ENCOUNTER — Encounter (HOSPITAL_COMMUNITY): Payer: Self-pay | Admitting: Surgery

## 2020-05-31 ENCOUNTER — Other Ambulatory Visit: Payer: Self-pay

## 2020-05-31 ENCOUNTER — Other Ambulatory Visit
Admission: RE | Admit: 2020-05-31 | Discharge: 2020-05-31 | Disposition: A | Discharge status: Home / Self Care | Payer: PPO | Source: Ambulatory Visit | Attending: Surgery | Admitting: Surgery

## 2020-05-31 DIAGNOSIS — Z20822 Contact with and (suspected) exposure to covid-19: Secondary | ICD-10-CM | POA: Insufficient documentation

## 2020-05-31 DIAGNOSIS — Z01812 Encounter for preprocedural laboratory examination: Secondary | ICD-10-CM | POA: Insufficient documentation

## 2020-05-31 MED ORDER — VANCOMYCIN HCL 1500 MG/300ML IV SOLN
1500.0000 mg | INTRAVENOUS | Status: AC
  Administered 2020-06-01: 1500 mg via INTRAVENOUS
  Filled 2020-05-31: qty 300

## 2020-05-31 NOTE — Telephone Encounter (Signed)
I spoke with Jeani Hawking from [facility]. Given that he is high risk and reporting chest pain with typical and atypical features, I have deferred to Dr. Tamala Julian. It appears pharmacy has also deferred to Dr. Tamala Julian. [facility] will reach out to him directly.

## 2020-05-31 NOTE — Telephone Encounter (Signed)
Okay to hold Eliquis. The urgency of clinical situation prevents testing. Cleared for the TCAR and will be at elevated risk.

## 2020-05-31 NOTE — Anesthesia Preprocedure Evaluation (Addendum)
Anesthesia Evaluation  Patient identified by MRN, date of birth, ID band Patient awake    Reviewed: Allergy & Precautions, NPO status , Patient's Chart, lab work & pertinent test results  Airway Mallampati: III  TM Distance: >3 FB Neck ROM: Full    Dental  (+) Teeth Intact, Dental Advisory Given   Pulmonary former smoker,    breath sounds clear to auscultation       Cardiovascular hypertension,  Rhythm:Regular Rate:Normal     Neuro/Psych    GI/Hepatic   Endo/Other  diabetes  Renal/GU      Musculoskeletal   Abdominal   Peds  Hematology   Anesthesia Other Findings   Reproductive/Obstetrics                            Anesthesia Physical Anesthesia Plan  ASA: III  Anesthesia Plan: General   Post-op Pain Management:    Induction: Intravenous  PONV Risk Score and Plan: Ondansetron  Airway Management Planned: Oral ETT  Additional Equipment: Arterial line and Ultrasound Guidance Line Placement  Intra-op Plan:   Post-operative Plan: Extubation in OR  Informed Consent: I have reviewed the patients History and Physical, chart, labs and discussed the procedure including the risks, benefits and alternatives for the proposed anesthesia with the patient or authorized representative who has indicated his/her understanding and acceptance.     Dental advisory given  Plan Discussed with: Anesthesiologist and CRNA  Anesthesia Plan Comments: (See PAT note written 05/31/2020 by Myra Gianotti, PA-C. )       Anesthesia Quick Evaluation

## 2020-05-31 NOTE — Progress Notes (Signed)
Anesthesia Chart Review: Bradley [name]    Case: [id number] Date/Time: 06/01/20 0715   Procedure: RIGHT TRANSCAROTID ARTERY REVASCULARIZATION (Right )   Anesthesia type: General   Pre-op diagnosis: BILATERAL CAROTID ARTERY STENOSIS   Location: [facility] OR ROOM 16 / [place] OR   Surgeons: Serafina Mitchell, MD      DISCUSSION: Patient is a 74 year old male scheduled for the above procedure.  History includes former smoker (quit 03/26/09), CAD (s/p CABG: LIMA-LAD, SVG-D2, SVG-PDA 05/23/15), PAF (post CABG 2016; 01/12/20), CHF, RBBB, HTN, HLD, DM2, neuropathy, PAD (failied iliac stent followed by AFBG 1998), carotid artery stenosis (s/p left carotid endarterectomy 1994 with redo 2001), CVA (1987), OSA, GERD, hiatal hernia, CKD, IBS, back surgery (L5-S1 PLIF 03/24/13).  - Hospitalization [facility] 01/12/20-01/14/20 for recurrent syncope/near syncope in setting of diarrhea and weight loss x 5 weeks with antihypertensive and diuretic use. Mild to moderate carotid artery stenosis. LVEF 55% with inferior basal hypokinesis, grade 1 diastolic dysfunction, no AS.  Dehydration with orthostatic hypotension felt to be the primary contributing factors.  He was hydrated, and losartan and Demadex held until follow-up with cardiology.  He also had nonsustained rate controlled atrial fibrillation, and he was changed from Plavix and Aggrenox (for PAD) and ASA to Eliquis and ASA.   [facility] ED visit 05/17/20-05/18/20 for new weakness and blurry vision with imaging showing new new occlusion right ICA (< 40% on the left) and incidental finding of left thyroid nodule (told to follow-up with PCP). Carotid US only showed 40-59% stenosis on the right. CT imaging showed old right MCA infarct, but no acute findings. Vascular surgery consulted with out-patient follow-up recommended with consideration of intervention. He followed with up [place]ham, V. Rock Nephew, MD on 05/21/20. He thought patient may have had right sided amaurosis fugax. Since the degree  of RICA stensosis varied by [place] and CTA, he wanted to review CTA results prior to making recommendations, and is now scheduled for right TCAR.    Last visit with cardiologist Dr. Tamala Julian on 03/07/20.  He was maintaining normal sinus rhythm.  No angina.  Amlodipine increased for HTN (BP higher in RUE).  29-month follow-up planned. Dr. Trula Slade reached out to Dr. Tamala Julian regarding surgery plans. Dr. Tamala Julian responded: "Okay to hold Eliquis. The urgency of clinical situation prevents testing. Cleared for the TCAR and will be at elevated risk."  Per [facility]: Continue ASA and Plavix, hold Elilquis 2 days prior to surgery. Patient reported last dose Eliquis 05/30/20.  05/31/20 preprocedure COVID-19 test in process.  Anesthesia team to evaluate on the day of surgery.  He will also need labs on arrival--known CKD.   VS: Ht 5\' 11"  (1.803 m)   Wt 99.8 kg   BMI 30.68 kg/m   BP Readings from Last 3 Encounters:  05/21/20 131/76  03/07/20 (!) 142/72  02/01/20 126/68   Pulse Readings from Last 3 Encounters:  05/21/20 72  03/07/20 70  02/01/20 74     PROVIDERS: [name], MD is PCP  Daneen Schick, MD is cardiologist Donato Heinz, MD is nephrologist Delrae Rend, MD is endocrinologist   LABS: He is for labs on day of procedure. As of 05/17/20 [facility]), Cr 2.52 (range of 1.82-2.88 since at least 11/2017 and most often ~ 2.3 by [facility] labs; H/H 16.2/47.9, PLT 244. A1c 8.3% on 01/13/20.     IMAGES: CTA neck 05/17/20 [facility]): -Focal near-complete occlusion of the right ICA immediately distal to the carotid bifurcation.  -Post surgical changes of  the left common carotid with approximately 40% narrowing secondary to atherosclerotic disease.  -1.6 cm left thyroid nodule, indeterminate. Recommend correlation with prior studies and/or evaluation with nonemergent thyroid ultrasound for further evaluation.  CT Head 05/17/20 [facility]): Impression: No acute intracranial abnormality.  Remote right MCA  territory infarcts.   EKG: EKG 05/17/20 [facility]): By Result Narrative in Care Everywhere: NORMAL SINUS RHYTHM  LEFT AXIS DEVIATION  RIGHT BUNDLE BRANCH BLOCK  ABNORMAL ECG  NO PREVIOUS ECGS AVAILABLE  Confirmed by Shara Blazing [phone]) on 05/17/2020 11:09:52 PM  EKG 02/01/20: NSR   CV: Right Carotid US 05/21/20: Summary:  Right Carotid: Velocities in the right ICA are consistent with a 40-59% stenosis. The ECA appears >50% stenosed.  Vertebrals: Right vertebral artery demonstrates antegrade flow.    Carotid US 05/18/20 [facility]): Final Interpretation  - Right Carotid: There is evidence in the ICA of a 40-59% stenosis. Non-hemodynamically significant plaque noted in the CCA. Plaque noted in the ECA.  - Left Carotid: There is evidence in the ICA of a less than 40% stenosis. Non-hemodynamically significant plaque noted in the CCA. Plaque noted in the ECA.  - Vertebrals: Both vertebral arteries were patent with antegrade flow.  - Subclavians: Normal flow hemodynamics were seen in bilateral subclavian arteries.    Echo 01/13/20: IMPRESSIONS  1. Inferior basal hypokinesis . Left ventricular ejection fraction, by  estimation, is 55%. The left ventricle has normal function. The left  ventricle has no regional wall motion abnormalities. Left ventricular  diastolic parameters are consistent with  Grade I diastolic dysfunction (impaired relaxation).  2. Right ventricular systolic function was not well visualized. The right  ventricular size is not well visualized.  3. The mitral valve is normal in structure. Trivial mitral valve  regurgitation. No evidence of mitral stenosis.  4. The aortic valve was not well visualized. Aortic valve regurgitation  is not visualized. Moderate sclerosis and calcification without stenosis.  5. The inferior vena cava is normal in size with greater than 50%  respiratory variability, suggesting right atrial pressure of 3 mmHg.  (Comparison echo 08/16/19 &  05/08/15: LVEF > 55%, no regional wall motion abnormalities)   Nuclear stress test 10/27/16:  Nuclear stress EF: 56%.  Blood pressure demonstrated a hypotensive response to exercise.  There was no ST segment deviation noted during stress.  No T wave inversion was noted during stress.  Defect 1: There is a small defect of moderate severity present in the basal-mid inferior location. This is consistent with diaphragmatic attenuation artifact.  This is a low risk study.    Cardiac event monitor 08/07/15-09/05/15:  NSR  No atrial fibrillation  No symptoms Normal with no AF   Last cardiac cath was on 05/17/15 prior to CABG.    Past Medical History:  Diagnosis Date  . Acquired equinus deformity of foot 12/15/2011  . Anemia   . Arthritis   . Carotid artery occlusion    a. Carotid US 10/16: RICA 60-79%, L CEA patent with 1-39% stenosis  . CKD (chronic kidney disease)   . Coagulation disorder ([place]) 05/25/2019  . Coronary artery disease    a. Myoview 9/16:  EF 52%, inferior fixed defect consistent with diaphragmatic attenuation, intermediate risk secondary to poor exercise tolerance and symptoms during stress;  b. LHC 05/17/2015 90% mid LAD, 80% ost D2, 75% mid RCA, 35% prox RCA. >> S/p CABG  . Coronary artery disease involving coronary bypass graft of native heart with angina pectoris ([place])    CORONARY  ARTERY BYPASS GRAFTING x 3 - 05/23/2015 Left internal mammary artery to left anterior descending  Saphenous vein graft to diagonal  Saphenous vein graft to posterior descending Endoscopic greater saphenous vein harvest right thigh   . Diabetes mellitus age 65  . Diverticulitis   . Diverticulosis of colon without hemorrhage 05/17/2015   By CT scan   . DM type 2, controlled, with complication ([place]) 7/98/9211  . Essential hypertension 04/29/2015  . GERD (gastroesophageal reflux disease)   . H/O hiatal hernia   . History of echocardiogram    a. Echo 9/16: GLS -15.2%, EF 55-60%, grade 1  diastolic dysfunction, normal wall motion, aortic sclerosis, dilated aortic root 39 mm, atrial septal lipomatous hypertrophy  . Hyperlipidemia   . Hypertension   . Irritable bowel syndrome (IBS)   . Myocardial infarction [facility])    silent inferior MI; patient denies MI history (03/17/13)   . Neuropathy 2013  . OSA on CPAP 05/30/2015  . Osteoporosis 2013  . PAF (paroxysmal atrial fibrillation) ([place])    post CABG 2016; Amiodarone stopped 2/2 wheezing; PAF 01/12/20  . Peripheral vascular disease ([place])   . Plantar fasciitis 12/15/2011  . Reflux esophagitis   . Right bundle branch block 04/30/2015  . Sleep apnea    uses CPAP  . Stenosis of right internal carotid artery with cerebral infarction ([place]) 12/09/2011   Right brain CVA 1987   . Stress fracture of metatarsal bone 02/18/2012  . Stroke [facility]) 1987   Right brain stroke  . Syncope 01/12/2020  . Type 2 diabetes mellitus with vascular disease ([place]) 05/30/2015  . Wears glasses     Past Surgical History:  Procedure Laterality Date  . CARDIAC CATHETERIZATION N/A 05/17/2015   Procedure: Left Heart Cath and Coronary Angiography;  Surgeon: Belva Crome, MD;  Location: [place] CV LAB;  Service: Cardiovascular;  Laterality: N/A;  . CAROTID ENDARTERECTOMY  1994 & redo 2001   Left  . COLONOSCOPY W/ BIOPSIES AND POLYPECTOMY    . CORONARY ARTERY BYPASS GRAFT N/A 05/23/2015   Procedure: CORONARY ARTERY BYPASS GRAFTING (CABG) x 3 (LIMA to LAD, SVG to DIAGONAL 2, SVG to PDA) with Endoscopic Vein Harvesting from right greater saphenous vein;  Surgeon: Grace Isaac, MD;  Location: [place];  Service: Open Heart Surgery;  Laterality: N/A;  . FRACTURE SURGERY  2013   Right   foo  t X's 2  . ILIAC ARTERY STENT    . LAPAROSCOPIC CHOLECYSTECTOMY  09/11/2016  . POSTERIOR LUMBAR FUSION  Aug. 14, 2014   Level 1  . PR VEIN BYPASS GRAFT,AORTO-FEM-POP  1998  . [place] SURGERY  2014  . TEE WITHOUT CARDIOVERSION N/A 05/23/2015   Procedure: TRANSESOPHAGEAL  ECHOCARDIOGRAM (TEE);  Surgeon: Grace Isaac, MD;  Location: [place];  Service: Open Heart Surgery;  Laterality: N/A;  . TONSILLECTOMY    . TRIGGER FINGER RELEASE Right 07/14/2017   Procedure: RIGHT LONG FINGER TRIGGER RELEASE;  Surgeon: Milly Jakob, MD;  Location: [place];  Service: Orthopedics;  Laterality: Right;    MEDICATIONS: . [START ON 06/01/2020] vancomycin (VANCOREADY) IVPB 1500 mg/300 mL   . amLODipine (NORVASC) 10 MG tablet  . apixaban (ELIQUIS) 5 MG TABS tablet  . aspirin EC 81 MG tablet  . atorvastatin (LIPITOR) 40 MG tablet  . Cholecalciferol (VITAMIN D3) 125 MCG (5000 UT) TABS  . dexlansoprazole (DEXILANT) 60 MG capsule  . FIASP FLEXTOUCH 100 UNIT/ML SOPN  . gabapentin (NEURONTIN) 300 MG capsule  . hydrocortisone (  PROCTOSOL HC) 2.5 % rectal cream  . ketoconazole (NIZORAL) 2 % shampoo  . L-Methylfolate-Algae-B12-B6 (FOLTANX RF) 3-90.314-2-35 MG CAPS  . methocarbamol (ROBAXIN) 500 MG tablet  . metoprolol tartrate (LOPRESSOR) 50 MG tablet  . nitroGLYCERIN (NITROSTAT) 0.3 MG SL tablet  . testosterone cypionate (DEPOTESTOSTERONE CYPIONATE) 200 MG/ML injection  . torsemide (DEMADEX) 20 MG tablet  . TRESIBA FLEXTOUCH 100 UNIT/ML SOPN FlexTouch Pen  . TRULICITY 4.58 KD/9.8PJ SOPN  . clopidogrel (PLAVIX) 75 MG tablet  . hydrocortisone 2.5 % cream    Myra Gianotti, PA-C Surgical Short Stay/Anesthesiology [facility] Phone [phone] [facility] Phone [phone] 05/31/2020 4:09 PM

## 2020-05-31 NOTE — Telephone Encounter (Signed)
Follow Up:   Bradley [name] is calling- needs clearance asap. Pt is scheduled for surgery tomorrow. If she does not answer call [name] at [phone].  She said she need this right away please.

## 2020-05-31 NOTE — Progress Notes (Signed)
PCP - Dr. Harle Battiest Cardiologist - Dr. Daneen Schick   Chest x-ray - n/a EKG - 02/01/20 Stress Test - 10/27/16 ECHO - 01/13/20 Cardiac Cath - 01/15/15  Sleep Study - yes CPAP - yes   Fasting Blood Sugar - 150s - 240s  Checks Blood Sugar - pt has blood sugar monitor   Follow your surgeon's instructions on when to stop Aspirin, apixaban (ELIQUIS), and clopidogrel (PLAVIX).  If no instructions were given by your surgeon then you will need to call the office to get those instructions.    Per pt and surgeon's orders, stop Eliquis 2 days prior to surgery and continue taking Plavix and ASA, per pt LD Eliquis10/20/21.   COVID TEST- 05/31/20  Coronavirus Screening  Have you experienced the following symptoms:  Cough yes/no: No Fever (>100.67F)  yes/no: No Runny nose yes/no: No Sore throat yes/no: No Difficulty breathing/shortness of breath  yes/no: No  Have you or a family member traveled in the last 14 days and where? yes/no: No   If the patient indicates "YES" to the above questions, their PAT will be rescheduled to limit the exposure to others and, the surgeon will be notified. THE PATIENT WILL NEED TO BE ASYMPTOMATIC FOR 14 DAYS.   If the patient is not experiencing any of these symptoms, the PAT nurse will instruct them to NOT bring anyone with them to their appointment since they may have these symptoms or traveled as well.   Please remind your patients and families that hospital visitation restrictions are in effect and the importance of the restrictions.     Anesthesia review: yes cardiac hx   -------------  SDW INSTRUCTIONS:  Your procedure is scheduled on 06/01/20 Friday .  Report to [facility] Main Entrance "A" at 05:30 A.M., and check in at the Admitting office.  Call this number if you have problems the morning of surgery: [phone]   Remember: Do not eat or drink after midnight the night before your surgery   Take these medicines the morning of surgery with A  SIP OF WATER: amLODipine (NORVASC)  atorvastatin (LIPITOR)  dexlansoprazole (DEXILANT)  metoprolol tartrate (LOPRESSOR)    If needed: methocarbamol (ROBAXIN), nitroGLYCERIN (NITROSTAT)  Follow your surgeon's instructions on when to stop Aspirin, apixaban (ELIQUIS), and clopidogrel (PLAVIX).  If no instructions were given by your surgeon then you will need to call the office to get those instructions.    As of today, STOP taking any Aspirin (unless otherwise instructed by your surgeon), Aleve, Naproxen, Ibuprofen, Motrin, Advil, Goody's, BC's, all herbal medications, fish oil, and all vitamins.   ** PLEASE check your blood sugar the morning of your surgery when you wake up and every 2 hours until you get to the Short Stay unit.  If your blood sugar is less than 70 mg/dL, you will need to treat for low blood sugar: - Do not take insulin. - Treat a low blood sugar (less than 70 mg/dL) with  cup of clear juice (cranberry or apple), 4 glucose tablets, OR glucose gel. - Recheck blood sugar in 15 minutes after treatment (to make sure it is greater than 70 mg/dL). If your blood sugar is not greater than 70 mg/dL on recheck, call [phone] for further instructions.   FIASP FLEXTOUCH  Day before surgery - take as usual, no bedtime dose Day of surgery - NONE - if CBG >220 take half of usual correctional dose  TRULICITY Day before surgery -  Day of surgery -  The Morning of Surgery  Do not wear jewelry.  Do not wear lotions, powders, colognes, or deodorant Men may shave face and neck.  Do not bring valuables to the hospital.  [facility] is not responsible for any belongings or valuables.  If you are a smoker, DO NOT Smoke 24 hours prior to surgery  If you wear a CPAP at night please bring your mask the morning of surgery   Remember that you must have someone to transport you home after your surgery, and remain with you for 24 hours if you are discharged the same day.   Please  bring cases for contacts, glasses, hearing aids, dentures or bridgework because it cannot be worn into surgery.    Leave your suitcase in the car.  After surgery it may be brought to your room.  For patients admitted to the hospital, discharge time will be determined by your treatment team.  Patients discharged the day of surgery will not be allowed to drive home.    Special instructions:   [place]- Preparing For Surgery  Oral Hygiene is also important to reduce your risk of infection.  Remember - BRUSH YOUR TEETH THE MORNING OF SURGERY WITH YOUR REGULAR TOOTHPASTE  Please follow these instructions carefully.   1. Shower the NIGHT BEFORE SURGERY and the MORNING OF SURGERY with DIAL Soap.   2. Wash thoroughly, paying special attention to the area where your surgery will be performed.  3. Thoroughly rinse your body with warm water from the neck down.  4. Pat yourself dry with a CLEAN TOWEL.  5. Wear CLEAN PAJAMAS to bed the night before surgery  6. Place CLEAN SHEETS on your bed the night of your first shower and DO NOT SLEEP WITH PETS.  7. Wear comfortable clothes the morning of surgery.    Day of Surgery:  Please shower the morning of surgery with the DIAL soap Do not apply any deodorants/lotions. Please wear clean clothes to the hospital/surgery center.   Remember to brush your teeth WITH YOUR REGULAR TOOTHPASTE.   Please read over the following fact sheets that you were given.  Patient denies shortness of breath, fever, cough and chest pain.

## 2020-05-31 NOTE — Telephone Encounter (Signed)
Spoke with Fabian Sharp, PA regarding Dr. Tamala Julian cardiac clearance recommendations:  Okay to hold Eliquis. The urgency of clinical situation prevents testing. Cleared for the TCAR and will be at elevated risk.  Dr. Trula Slade and patient informed of provider recommendations. Surgery to proceed as scheduled on 06/01/20.

## 2020-06-01 ENCOUNTER — Inpatient Hospital Stay (HOSPITAL_COMMUNITY): Payer: PPO

## 2020-06-01 ENCOUNTER — Other Ambulatory Visit: Payer: Self-pay

## 2020-06-01 ENCOUNTER — Encounter (HOSPITAL_COMMUNITY): Payer: Self-pay | Admitting: Surgery

## 2020-06-01 ENCOUNTER — Inpatient Hospital Stay (HOSPITAL_COMMUNITY): Payer: PPO | Admitting: Physician Assistant

## 2020-06-01 ENCOUNTER — Encounter (HOSPITAL_COMMUNITY)
Admission: RE | Disposition: A | Discharge status: Home / Self Care | Payer: Self-pay | Source: Home / Self Care | Attending: Surgery

## 2020-06-01 ENCOUNTER — Inpatient Hospital Stay (HOSPITAL_COMMUNITY)
Admission: RE | Admit: 2020-06-01 | Discharge: 2020-06-02 | DRG: 035 | Disposition: A | Discharge status: Home / Self Care | Payer: PPO | Attending: Surgery | Admitting: Surgery

## 2020-06-01 DIAGNOSIS — I951 Orthostatic hypotension: Secondary | ICD-10-CM | POA: Diagnosis not present

## 2020-06-01 DIAGNOSIS — Z7901 Long term (current) use of anticoagulants: Secondary | ICD-10-CM

## 2020-06-01 DIAGNOSIS — I252 Old myocardial infarction: Secondary | ICD-10-CM

## 2020-06-01 DIAGNOSIS — E785 Hyperlipidemia, unspecified: Secondary | ICD-10-CM | POA: Diagnosis not present

## 2020-06-01 DIAGNOSIS — I480 Paroxysmal atrial fibrillation: Secondary | ICD-10-CM | POA: Diagnosis not present

## 2020-06-01 DIAGNOSIS — E1122 Type 2 diabetes mellitus with diabetic chronic kidney disease: Secondary | ICD-10-CM | POA: Diagnosis present

## 2020-06-01 DIAGNOSIS — Z8249 Family history of ischemic heart disease and other diseases of the circulatory system: Secondary | ICD-10-CM

## 2020-06-01 DIAGNOSIS — E1142 Type 2 diabetes mellitus with diabetic polyneuropathy: Secondary | ICD-10-CM | POA: Diagnosis present

## 2020-06-01 DIAGNOSIS — Z20822 Contact with and (suspected) exposure to covid-19: Secondary | ICD-10-CM | POA: Diagnosis not present

## 2020-06-01 DIAGNOSIS — Z79890 Hormone replacement therapy (postmenopausal): Secondary | ICD-10-CM

## 2020-06-01 DIAGNOSIS — E041 Nontoxic single thyroid nodule: Secondary | ICD-10-CM | POA: Diagnosis not present

## 2020-06-01 DIAGNOSIS — H5440 Blindness, one eye, unspecified eye: Secondary | ICD-10-CM | POA: Diagnosis not present

## 2020-06-01 DIAGNOSIS — I25810 Atherosclerosis of coronary artery bypass graft(s) without angina pectoris: Secondary | ICD-10-CM | POA: Diagnosis not present

## 2020-06-01 DIAGNOSIS — Z87891 Personal history of nicotine dependence: Secondary | ICD-10-CM | POA: Diagnosis not present

## 2020-06-01 DIAGNOSIS — Z9889 Other specified postprocedural states: Secondary | ICD-10-CM | POA: Diagnosis present

## 2020-06-01 DIAGNOSIS — I6529 Occlusion and stenosis of unspecified carotid artery: Secondary | ICD-10-CM | POA: Diagnosis present

## 2020-06-01 DIAGNOSIS — N184 Chronic kidney disease, stage 4 (severe): Secondary | ICD-10-CM | POA: Diagnosis not present

## 2020-06-01 DIAGNOSIS — Z885 Allergy status to narcotic agent status: Secondary | ICD-10-CM

## 2020-06-01 DIAGNOSIS — Z79899 Other long term (current) drug therapy: Secondary | ICD-10-CM

## 2020-06-01 DIAGNOSIS — E119 Type 2 diabetes mellitus without complications: Secondary | ICD-10-CM | POA: Diagnosis not present

## 2020-06-01 DIAGNOSIS — I10 Essential (primary) hypertension: Secondary | ICD-10-CM | POA: Diagnosis not present

## 2020-06-01 DIAGNOSIS — G4733 Obstructive sleep apnea (adult) (pediatric): Secondary | ICD-10-CM | POA: Diagnosis not present

## 2020-06-01 DIAGNOSIS — M810 Age-related osteoporosis without current pathological fracture: Secondary | ICD-10-CM | POA: Diagnosis present

## 2020-06-01 DIAGNOSIS — Z8673 Personal history of transient ischemic attack (TIA), and cerebral infarction without residual deficits: Secondary | ICD-10-CM | POA: Diagnosis not present

## 2020-06-01 DIAGNOSIS — I6521 Occlusion and stenosis of right carotid artery: Secondary | ICD-10-CM | POA: Diagnosis not present

## 2020-06-01 DIAGNOSIS — I129 Hypertensive chronic kidney disease with stage 1 through stage 4 chronic kidney disease, or unspecified chronic kidney disease: Secondary | ICD-10-CM | POA: Diagnosis not present

## 2020-06-01 DIAGNOSIS — K589 Irritable bowel syndrome without diarrhea: Secondary | ICD-10-CM | POA: Diagnosis not present

## 2020-06-01 DIAGNOSIS — E860 Dehydration: Secondary | ICD-10-CM | POA: Diagnosis present

## 2020-06-01 DIAGNOSIS — Z83438 Family history of other disorder of lipoprotein metabolism and other lipidemia: Secondary | ICD-10-CM

## 2020-06-01 DIAGNOSIS — I4510 Unspecified right bundle-branch block: Secondary | ICD-10-CM | POA: Diagnosis not present

## 2020-06-01 DIAGNOSIS — Z7982 Long term (current) use of aspirin: Secondary | ICD-10-CM

## 2020-06-01 DIAGNOSIS — Z823 Family history of stroke: Secondary | ICD-10-CM

## 2020-06-01 DIAGNOSIS — E1151 Type 2 diabetes mellitus with diabetic peripheral angiopathy without gangrene: Secondary | ICD-10-CM | POA: Diagnosis present

## 2020-06-01 DIAGNOSIS — K2100 Gastro-esophageal reflux disease with esophagitis, without bleeding: Secondary | ICD-10-CM | POA: Diagnosis not present

## 2020-06-01 DIAGNOSIS — Z880 Allergy status to penicillin: Secondary | ICD-10-CM

## 2020-06-01 DIAGNOSIS — I6523 Occlusion and stenosis of bilateral carotid arteries: Secondary | ICD-10-CM | POA: Diagnosis not present

## 2020-06-01 DIAGNOSIS — Z882 Allergy status to sulfonamides status: Secondary | ICD-10-CM

## 2020-06-01 HISTORY — DX: Chronic kidney disease, unspecified: N18.9

## 2020-06-01 HISTORY — PX: TRANSCAROTID ARTERY REVASCULARIZATIONÂ: SHX6778

## 2020-06-01 LAB — COMPREHENSIVE METABOLIC PANEL
ALT: 19 U/L (ref 0–44)
AST: 27 U/L (ref 15–41)
Albumin: 3.6 g/dL (ref 3.5–5.0)
Alkaline Phosphatase: 52 U/L (ref 38–126)
Anion gap: 13 (ref 5–15)
BUN: 34 mg/dL — ABNORMAL HIGH (ref 8–23)
CO2: 23 mmol/L (ref 22–32)
Calcium: 8.9 mg/dL (ref 8.9–10.3)
Chloride: 103 mmol/L (ref 98–111)
Creatinine, Ser: 2.64 mg/dL — ABNORMAL HIGH (ref 0.61–1.24)
GFR, Estimated: 24 mL/min — ABNORMAL LOW (ref >60)
Glucose, Bld: 195 mg/dL — ABNORMAL HIGH (ref 70–99)
Potassium: 3.8 mmol/L (ref 3.5–5.1)
Sodium: 139 mmol/L (ref 135–145)
Total Bilirubin: 1 mg/dL (ref 0.3–1.2)
Total Protein: 6.3 g/dL — ABNORMAL LOW (ref 6.5–8.1)

## 2020-06-01 LAB — URINALYSIS, ROUTINE W REFLEX MICROSCOPIC
Bilirubin Urine: NEGATIVE
Glucose, UA: >=500 mg/dL — AB
Hgb urine dipstick: NEGATIVE
Ketones, ur: NEGATIVE mg/dL
Nitrite: NEGATIVE
Protein, ur: 30 mg/dL — AB
Specific Gravity, Urine: 1.013 (ref 1.005–1.030)
pH: 6 (ref 5.0–8.0)

## 2020-06-01 LAB — GLUCOSE, CAPILLARY
Glucose-Capillary: 209 mg/dL — ABNORMAL HIGH (ref 70–99)
Glucose-Capillary: 109 mg/dL — ABNORMAL HIGH (ref 70–99)
Glucose-Capillary: 186 mg/dL — ABNORMAL HIGH (ref 70–99)
Glucose-Capillary: 186 mg/dL — ABNORMAL HIGH (ref 70–99)

## 2020-06-01 LAB — CBC
HCT: 44.7 % (ref 39.0–52.0)
HCT: 39.9 % (ref 39.0–52.0)
Hemoglobin: 14.9 g/dL (ref 13.0–17.0)
Hemoglobin: 13.3 g/dL (ref 13.0–17.0)
MCH: 33.1 pg (ref 26.0–34.0)
MCH: 33 pg (ref 26.0–34.0)
MCHC: 33.3 g/dL (ref 30.0–36.0)
MCHC: 33.3 g/dL (ref 30.0–36.0)
MCV: 99.3 fL (ref 80.0–100.0)
MCV: 99 fL (ref 80.0–100.0)
Platelets: 222 10*3/uL (ref 150–400)
Platelets: 193 10*3/uL (ref 150–400)
RBC: 4.5 MIL/uL (ref 4.22–5.81)
RBC: 4.03 MIL/uL — ABNORMAL LOW (ref 4.22–5.81)
RDW: 12.4 % (ref 11.5–15.5)
RDW: 12.6 % (ref 11.5–15.5)
WBC: 7.4 10*3/uL (ref 4.0–10.5)
WBC: 9.9 10*3/uL (ref 4.0–10.5)
nRBC: 0 % (ref 0.0–0.2)
nRBC: 0 % (ref 0.0–0.2)

## 2020-06-01 LAB — SURGICAL PCR SCREEN
MRSA, PCR: NEGATIVE
Staphylococcus aureus: NEGATIVE

## 2020-06-01 LAB — PROTIME-INR
INR: 1 (ref 0.8–1.2)
Prothrombin Time: 12.3 seconds (ref 11.4–15.2)

## 2020-06-01 LAB — POCT ACTIVATED CLOTTING TIME
Activated Clotting Time: 208 seconds
Activated Clotting Time: 235 seconds
Activated Clotting Time: 235 seconds
Activated Clotting Time: >1000 seconds

## 2020-06-01 LAB — TYPE AND SCREEN
ABO/RH(D): A NEG
Antibody Screen: NEGATIVE

## 2020-06-01 LAB — SARS CORONAVIRUS 2 (TAT 6-24 HRS): SARS Coronavirus 2: NEGATIVE

## 2020-06-01 LAB — CREATININE, SERUM
Creatinine, Ser: 2.62 mg/dL — ABNORMAL HIGH (ref 0.61–1.24)
GFR, Estimated: 25 mL/min — ABNORMAL LOW (ref >60)

## 2020-06-01 LAB — APTT: aPTT: 26 seconds (ref 24–36)

## 2020-06-01 SURGERY — TRANSCAROTID ARTERY REVASCULARIZATION (TCAR)
Anesthesia: General | Site: Neck | Laterality: Right

## 2020-06-01 MED ORDER — GLYCOPYRROLATE 0.2 MG/ML IJ SOLN
INTRAMUSCULAR | Status: DC | PRN
  Administered 2020-06-01: .2 mg via INTRAVENOUS

## 2020-06-01 MED ORDER — ALUM & MAG HYDROXIDE-SIMETH 200-200-20 MG/5ML PO SUSP: 15.0000 mL | ORAL | Status: DC | PRN

## 2020-06-01 MED ORDER — STERILE WATER FOR IRRIGATION IR SOLN
Status: DC | PRN
  Administered 2020-06-01: 1000 mL

## 2020-06-01 MED ORDER — PHENOL 1.4 % MT LIQD: 1.0000 | OROMUCOSAL | Status: DC | PRN

## 2020-06-01 MED ORDER — IODIXANOL 320 MG/ML IV SOLN
INTRAVENOUS | Status: DC | PRN
  Administered 2020-06-01: 20 mL via INTRA_ARTERIAL

## 2020-06-01 MED ORDER — ONDANSETRON HCL 4 MG/2ML IJ SOLN
INTRAMUSCULAR | Status: DC | PRN
  Administered 2020-06-01: 4 mg via INTRAVENOUS

## 2020-06-01 MED ORDER — HYDROMORPHONE HCL 1 MG/ML IJ SOLN: 0.5000 mg | INTRAMUSCULAR | Status: DC | PRN

## 2020-06-01 MED ORDER — LACTATED RINGERS IV SOLN: INTRAVENOUS | Status: DC | PRN

## 2020-06-01 MED ORDER — FENTANYL CITRATE (PF) 250 MCG/5ML IJ SOLN
INTRAMUSCULAR | Status: DC | PRN
  Administered 2020-06-01 (×2): 50 ug via INTRAVENOUS

## 2020-06-01 MED ORDER — FLEET ENEMA 7-19 GM/118ML RE ENEM: 1.0000 | ENEMA | Freq: Once | RECTAL | Status: DC | PRN

## 2020-06-01 MED ORDER — PROPOFOL 10 MG/ML IV BOLUS
INTRAVENOUS | Status: DC | PRN
  Administered 2020-06-01: 100 mg via INTRAVENOUS

## 2020-06-01 MED ORDER — BISACODYL 5 MG PO TBEC: 5.0000 mg | DELAYED_RELEASE_TABLET | Freq: Every day | ORAL | Status: DC | PRN

## 2020-06-01 MED ORDER — NITROGLYCERIN 0.4 MG SL SUBL: 0.4000 mg | SUBLINGUAL_TABLET | SUBLINGUAL | Status: DC | PRN

## 2020-06-01 MED ORDER — SODIUM CHLORIDE 0.9 % IV SOLN
INTRAVENOUS | Status: DC | PRN
  Administered 2020-06-01: 1000 mL via INTRAVENOUS

## 2020-06-01 MED ORDER — HEPARIN SODIUM (PORCINE) 5000 UNIT/ML IJ SOLN: 5000.0000 [IU] | Freq: Three times a day (TID) | INTRAMUSCULAR | Status: DC

## 2020-06-01 MED ORDER — FOLTANX RF 3-90.314-2-35 MG PO CAPS: 1.0000 | ORAL_CAPSULE | Freq: Two times a day (BID) | ORAL | Status: DC

## 2020-06-01 MED ORDER — FENTANYL CITRATE (PF) 250 MCG/5ML IJ SOLN
INTRAMUSCULAR | Status: AC
  Filled 2020-06-01: qty 5

## 2020-06-01 MED ORDER — HYDROCORTISONE (PERIANAL) 2.5 % EX CREA: 1.0000 "application " | TOPICAL_CREAM | Freq: Every day | CUTANEOUS | Status: DC | PRN

## 2020-06-01 MED ORDER — OXYCODONE HCL 5 MG PO TABS: 5.0000 mg | ORAL_TABLET | ORAL | Status: DC | PRN

## 2020-06-01 MED ORDER — SODIUM CHLORIDE 0.9 % IV SOLN: INTRAVENOUS | Status: DC | PRN

## 2020-06-01 MED ORDER — HYDRALAZINE HCL 20 MG/ML IJ SOLN: 5.0000 mg | INTRAMUSCULAR | Status: DC | PRN

## 2020-06-01 MED ORDER — CHLORHEXIDINE GLUCONATE CLOTH 2 % EX PADS: 6.0000 | MEDICATED_PAD | Freq: Once | CUTANEOUS | Status: DC

## 2020-06-01 MED ORDER — HEPARIN SODIUM (PORCINE) 1000 UNIT/ML IJ SOLN
INTRAMUSCULAR | Status: AC
  Filled 2020-06-01: qty 1

## 2020-06-01 MED ORDER — CHLORHEXIDINE GLUCONATE 0.12 % MT SOLN
15.0000 mL | Freq: Once | OROMUCOSAL | Status: AC
  Administered 2020-06-01: 15 mL via OROMUCOSAL
  Filled 2020-06-01 (×2): qty 15

## 2020-06-01 MED ORDER — PROPOFOL 10 MG/ML IV BOLUS
INTRAVENOUS | Status: AC
  Filled 2020-06-01: qty 20

## 2020-06-01 MED ORDER — ROCURONIUM BROMIDE 10 MG/ML (PF) SYRINGE
PREFILLED_SYRINGE | INTRAVENOUS | Status: DC | PRN
  Administered 2020-06-01: 20 mg via INTRAVENOUS
  Administered 2020-06-01: 30 mg via INTRAVENOUS
  Administered 2020-06-01: 50 mg via INTRAVENOUS

## 2020-06-01 MED ORDER — INSULIN GLARGINE 100 UNIT/ML ~~LOC~~ SOLN
20.0000 [IU] | Freq: Every day | SUBCUTANEOUS | Status: DC
  Administered 2020-06-01 – 2020-06-02 (×2): 20 [IU] via SUBCUTANEOUS
  Filled 2020-06-01 (×2): qty 0.2

## 2020-06-01 MED ORDER — ONDANSETRON HCL 4 MG/2ML IJ SOLN: 4.0000 mg | Freq: Four times a day (QID) | INTRAMUSCULAR | Status: DC | PRN

## 2020-06-01 MED ORDER — METOPROLOL TARTRATE 50 MG PO TABS
50.0000 mg | ORAL_TABLET | Freq: Two times a day (BID) | ORAL | Status: DC
  Administered 2020-06-01 – 2020-06-02 (×2): 50 mg via ORAL
  Filled 2020-06-01 (×2): qty 1

## 2020-06-01 MED ORDER — INSULIN ASPART 100 UNIT/ML ~~LOC~~ SOLN
0.0000 [IU] | Freq: Three times a day (TID) | SUBCUTANEOUS | Status: DC
  Administered 2020-06-01 – 2020-06-02 (×2): 4 [IU] via SUBCUTANEOUS

## 2020-06-01 MED ORDER — PROTAMINE SULFATE 10 MG/ML IV SOLN
INTRAVENOUS | Status: DC | PRN
  Administered 2020-06-01: 40 mg via INTRAVENOUS
  Administered 2020-06-01: 10 mg via INTRAVENOUS

## 2020-06-01 MED ORDER — GABAPENTIN 300 MG PO CAPS
600.0000 mg | ORAL_CAPSULE | Freq: Every day | ORAL | Status: DC
  Administered 2020-06-01: 600 mg via ORAL
  Filled 2020-06-01: qty 2

## 2020-06-01 MED ORDER — LABETALOL HCL 5 MG/ML IV SOLN: 10.0000 mg | INTRAVENOUS | Status: DC | PRN

## 2020-06-01 MED ORDER — METHOCARBAMOL 500 MG PO TABS: 500.0000 mg | ORAL_TABLET | Freq: Four times a day (QID) | ORAL | Status: DC | PRN

## 2020-06-01 MED ORDER — ATORVASTATIN CALCIUM 40 MG PO TABS
40.0000 mg | ORAL_TABLET | Freq: Every day | ORAL | Status: DC
  Administered 2020-06-02: 40 mg via ORAL
  Filled 2020-06-01: qty 1

## 2020-06-01 MED ORDER — SODIUM CHLORIDE 0.9 % IV SOLN
INTRAVENOUS | Status: AC
  Filled 2020-06-01: qty 1.2

## 2020-06-01 MED ORDER — ACETAMINOPHEN 325 MG PO TABS: 325.0000 mg | ORAL_TABLET | ORAL | Status: DC | PRN

## 2020-06-01 MED ORDER — LIDOCAINE 2% (20 MG/ML) 5 ML SYRINGE
INTRAMUSCULAR | Status: DC | PRN
  Administered 2020-06-01: 80 mg via INTRAVENOUS

## 2020-06-01 MED ORDER — METOPROLOL TARTRATE 5 MG/5ML IV SOLN: 2.0000 mg | INTRAVENOUS | Status: DC | PRN

## 2020-06-01 MED ORDER — VANCOMYCIN HCL IN DEXTROSE 1-5 GM/200ML-% IV SOLN
1000.0000 mg | Freq: Two times a day (BID) | INTRAVENOUS | Status: AC
  Administered 2020-06-01 – 2020-06-02 (×2): 1000 mg via INTRAVENOUS
  Filled 2020-06-01 (×2): qty 200

## 2020-06-01 MED ORDER — SENNOSIDES-DOCUSATE SODIUM 8.6-50 MG PO TABS: 1.0000 | ORAL_TABLET | Freq: Every evening | ORAL | Status: DC | PRN

## 2020-06-01 MED ORDER — SODIUM CHLORIDE 0.9 % IV SOLN: INTRAVENOUS | Status: DC

## 2020-06-01 MED ORDER — PANTOPRAZOLE SODIUM 40 MG PO TBEC
40.0000 mg | DELAYED_RELEASE_TABLET | Freq: Every day | ORAL | Status: DC
  Administered 2020-06-02: 40 mg via ORAL
  Filled 2020-06-01: qty 1

## 2020-06-01 MED ORDER — L-METHYLFOLATE-B6-B12 3-35-2 MG PO TABS
1.0000 | ORAL_TABLET | Freq: Every day | ORAL | Status: DC
  Administered 2020-06-01 – 2020-06-02 (×2): 1 via ORAL
  Filled 2020-06-01 (×3): qty 1

## 2020-06-01 MED ORDER — PHENYLEPHRINE HCL-NACL 10-0.9 MG/250ML-% IV SOLN
INTRAVENOUS | Status: DC | PRN
  Administered 2020-06-01: 75 ug/min via INTRAVENOUS

## 2020-06-01 MED ORDER — LIDOCAINE HCL (PF) 1 % IJ SOLN
INTRAMUSCULAR | Status: AC
  Filled 2020-06-01: qty 30

## 2020-06-01 MED ORDER — DOCUSATE SODIUM 100 MG PO CAPS
100.0000 mg | ORAL_CAPSULE | Freq: Every day | ORAL | Status: DC
  Administered 2020-06-02: 100 mg via ORAL
  Filled 2020-06-01: qty 1

## 2020-06-01 MED ORDER — HEPARIN SODIUM (PORCINE) 1000 UNIT/ML IJ SOLN
INTRAMUSCULAR | Status: DC | PRN
  Administered 2020-06-01: 3000 [IU] via INTRAVENOUS
  Administered 2020-06-01: 10000 [IU] via INTRAVENOUS
  Administered 2020-06-01: 3000 [IU] via INTRAVENOUS

## 2020-06-01 MED ORDER — PROTAMINE SULFATE 10 MG/ML IV SOLN
INTRAVENOUS | Status: AC
  Filled 2020-06-01: qty 5

## 2020-06-01 MED ORDER — ACETAMINOPHEN 650 MG RE SUPP: 325.0000 mg | RECTAL | Status: DC | PRN

## 2020-06-01 MED ORDER — CLOPIDOGREL BISULFATE 75 MG PO TABS
75.0000 mg | ORAL_TABLET | Freq: Every day | ORAL | Status: DC
  Administered 2020-06-02: 75 mg via ORAL
  Filled 2020-06-01: qty 1

## 2020-06-01 MED ORDER — ASPIRIN EC 81 MG PO TBEC
81.0000 mg | DELAYED_RELEASE_TABLET | Freq: Every day | ORAL | Status: DC
  Administered 2020-06-02: 81 mg via ORAL
  Filled 2020-06-01: qty 1

## 2020-06-01 MED ORDER — AMLODIPINE BESYLATE 10 MG PO TABS
10.0000 mg | ORAL_TABLET | Freq: Every day | ORAL | Status: DC
  Administered 2020-06-02: 10 mg via ORAL
  Filled 2020-06-01: qty 1

## 2020-06-01 MED ORDER — SODIUM CHLORIDE 0.9 % IV SOLN: 500.0000 mL | Freq: Once | INTRAVENOUS | Status: DC | PRN

## 2020-06-01 MED ORDER — MAGNESIUM SULFATE 2 GM/50ML IV SOLN: 2.0000 g | Freq: Every day | INTRAVENOUS | Status: DC | PRN

## 2020-06-01 MED ORDER — HEMOSTATIC AGENTS (NO CHARGE) OPTIME
TOPICAL | Status: DC | PRN
  Administered 2020-06-01: 1 via TOPICAL

## 2020-06-01 MED ORDER — GUAIFENESIN-DM 100-10 MG/5ML PO SYRP: 15.0000 mL | ORAL_SOLUTION | ORAL | Status: DC | PRN

## 2020-06-01 MED ORDER — SUGAMMADEX SODIUM 200 MG/2ML IV SOLN
INTRAVENOUS | Status: DC | PRN
  Administered 2020-06-01: 300 mg via INTRAVENOUS

## 2020-06-01 MED ORDER — 0.9 % SODIUM CHLORIDE (POUR BTL) OPTIME
TOPICAL | Status: DC | PRN
  Administered 2020-06-01: 1000 mL

## 2020-06-01 MED ORDER — POTASSIUM CHLORIDE CRYS ER 20 MEQ PO TBCR: 20.0000 meq | EXTENDED_RELEASE_TABLET | Freq: Every day | ORAL | Status: DC | PRN

## 2020-06-01 SURGICAL SUPPLY — 63 items
BAG BANDED W/RUBBER/TAPE 36X54 (MISCELLANEOUS) ×3 IMPLANT
BALLN STERLING RX 5X40X80 (BALLOONS) ×3
BALLN STERLING RX 6X20X80 (BALLOONS) ×3
BALLOON STERLING RX 5X40X80 (BALLOONS) ×1 IMPLANT
BALLOON STERLING RX 6X20X80 (BALLOONS) ×1 IMPLANT
CANISTER SUCT 3000ML PPV (MISCELLANEOUS) ×3 IMPLANT
CATH ROBINSON RED A/P 18FR (CATHETERS) IMPLANT
CATH SUCT 10FR WHISTLE TIP (CATHETERS) ×3 IMPLANT
CLIP VESOCCLUDE MED 6/CT (CLIP) ×3 IMPLANT
CLIP VESOCCLUDE SM WIDE 6/CT (CLIP) ×3 IMPLANT
COVER DOME SNAP 22 D (MISCELLANEOUS) ×3 IMPLANT
COVER PROBE W GEL 5X96 (DRAPES) ×3 IMPLANT
COVER WAND RF STERILE (DRAPES) ×3 IMPLANT
DERMABOND ADHESIVE PROPEN (GAUZE/BANDAGES/DRESSINGS) ×4
DERMABOND ADVANCED (GAUZE/BANDAGES/DRESSINGS) ×2
DERMABOND ADVANCED .7 DNX12 (GAUZE/BANDAGES/DRESSINGS) ×1 IMPLANT
DERMABOND ADVANCED .7 DNX6 (GAUZE/BANDAGES/DRESSINGS) ×2 IMPLANT
DRAPE FEMORAL ANGIO 80X135IN (DRAPES) ×6 IMPLANT
DRAPE INCISE IOBAN 66X45 STRL (DRAPES) ×6 IMPLANT
ELECT REM PT RETURN 9FT ADLT (ELECTROSURGICAL) ×3
ELECTRODE REM PT RTRN 9FT ADLT (ELECTROSURGICAL) ×1 IMPLANT
GLOVE BIOGEL PI IND STRL 7.5 (GLOVE) ×1 IMPLANT
GLOVE BIOGEL PI INDICATOR 7.5 (GLOVE) ×2
GLOVE SURG SS PI 7.5 STRL IVOR (GLOVE) ×3 IMPLANT
GOWN STRL REUS W/ TWL LRG LVL3 (GOWN DISPOSABLE) ×2 IMPLANT
GOWN STRL REUS W/ TWL XL LVL3 (GOWN DISPOSABLE) ×1 IMPLANT
GOWN STRL REUS W/TWL LRG LVL3 (GOWN DISPOSABLE) ×6
GOWN STRL REUS W/TWL XL LVL3 (GOWN DISPOSABLE) ×3
GUIDEWIRE ENROUTE 0.014 (WIRE) ×3 IMPLANT
HEMOSTAT SNOW SURGICEL 2X4 (HEMOSTASIS) IMPLANT
INTRODUCER KIT GALT 7CM (INTRODUCER) ×3
IV ADAPTER SYR DOUBLE MALE LL (MISCELLANEOUS) IMPLANT
KIT BASIN OR (CUSTOM PROCEDURE TRAY) ×3 IMPLANT
KIT ENCORE 26 ADVANTAGE (KITS) ×3 IMPLANT
KIT INTRODUCER GALT 7 (INTRODUCER) ×1 IMPLANT
KIT TURNOVER KIT B (KITS) ×3 IMPLANT
NEEDLE HYPO 25GX1X1/2 BEV (NEEDLE) IMPLANT
NEEDLE PERC 18GX7CM (NEEDLE) ×3 IMPLANT
PACK CAROTID (CUSTOM PROCEDURE TRAY) ×3 IMPLANT
PAD ARMBOARD 7.5X6 YLW CONV (MISCELLANEOUS) ×6 IMPLANT
POSITIONER HEAD DONUT 9IN (MISCELLANEOUS) ×3 IMPLANT
SET MICROPUNCTURE 5F STIFF (MISCELLANEOUS) IMPLANT
SHIELD RADPAD SCOOP 12X17 (MISCELLANEOUS) ×3 IMPLANT
STENT TRANSCAROTID SYSTEM 9X40 (Permanent Stent) ×3 IMPLANT
STOPCOCK 4 WAY LG BORE MALE ST (IV SETS) ×3 IMPLANT
SURGIFLO W/THROMBIN 8M KIT (HEMOSTASIS) ×3 IMPLANT
SUT MNCRL AB 4-0 PS2 18 (SUTURE) IMPLANT
SUT PROLENE 5 0 C 1 24 (SUTURE) ×6 IMPLANT
SUT SILK 2 0 PERMA HAND 18 BK (SUTURE) ×3 IMPLANT
SUT SILK 2 0 SH (SUTURE) ×3 IMPLANT
SUT VIC AB 3-0 SH 27 (SUTURE) ×6
SUT VIC AB 3-0 SH 27X BRD (SUTURE) ×2 IMPLANT
SYR 10ML LL (SYRINGE) ×9 IMPLANT
SYR 20ML LL LF (SYRINGE) ×3 IMPLANT
SYR 30ML LL (SYRINGE) ×3 IMPLANT
SYR BULB IRRIG 60ML STRL (SYRINGE) ×3 IMPLANT
SYR CONTROL 10ML LL (SYRINGE) IMPLANT
SYSTEM TRANSCAROTID NEUROPRTCT (MISCELLANEOUS) ×1 IMPLANT
TOWEL GREEN STERILE (TOWEL DISPOSABLE) ×3 IMPLANT
TRANSCAROTID NEUROPROTECT SYS (MISCELLANEOUS) ×3
WATER STERILE IRR 1000ML POUR (IV SOLUTION) ×3 IMPLANT
WIRE BENTSON .035X145CM (WIRE) ×3 IMPLANT
WIRE TORQFLEX AUST .018X40CM (WIRE) ×3 IMPLANT

## 2020-06-01 NOTE — Anesthesia Postprocedure Evaluation (Signed)
Anesthesia Post Note  Patient: Bradley [name]  Procedure(s) Performed: RIGHT TRANSCAROTID ARTERY REVASCULARIZATION (Right Neck)     Patient location during evaluation: PACU Anesthesia Type: General Level of consciousness: awake and alert Pain management: pain level controlled Vital Signs Assessment: post-procedure vital signs reviewed and stable Respiratory status: spontaneous breathing, nonlabored ventilation, respiratory function stable and patient connected to nasal cannula oxygen Cardiovascular status: blood pressure returned to baseline and stable Postop Assessment: no apparent nausea or vomiting Anesthetic complications: no   No complications documented.  Last Vitals:  Vitals:   06/01/20 1024 06/01/20 1039  BP: 132/74 122/71  Pulse: 81 83  Resp: 11 13  Temp:    SpO2: 99% 97%    Last Pain:  Vitals:   06/01/20 1039  TempSrc:   PainSc: 2                  Terrius Gentile COKER

## 2020-06-01 NOTE — Interval H&P Note (Signed)
History and Physical Interval Note:  06/01/2020 7:34 AM  Bradley [name]  has presented today for surgery, with the diagnosis of BILATERAL CAROTID ARTERY STENOSIS.  The various methods of treatment have been discussed with the patient and family. After consideration of risks, benefits and other options for treatment, the patient has consented to  Procedure(s): RIGHT TRANSCAROTID ARTERY REVASCULARIZATION (Right) as a surgical intervention.  The patient's history has been reviewed, patient examined, no change in status, stable for surgery.  I have reviewed the patient's chart and labs.  Questions were answered to the patient's satisfaction.     [name]

## 2020-06-01 NOTE — Telephone Encounter (Addendum)
   Primary Cardiologist: Sinclair Grooms, MD  Chart reviewed as part of pre-operative protocol coverage. Patient scheduled for TCAR on 06/01/2020. Patient was contacted on 05/30/2020 as part of pre-op evaluation and did report some chest discomfort that sounded position but also relieved with rest. He also noted problems with hypertensive urgency. Per Revised Cardiac Risk Index, considered high risk with 11% risk of cardiac complications. Patient was discussed with Dr. Tamala Julian - urgency of clinical situation prevents testing. Patient is OK for surgery but will be at elevated risk. OK to hold Eiliquis as needed prior to surgery. This should be restarted as soon as possible following procedure.   I will route this recommendation to the requesting party via Epic fax function and remove from pre-op pool.  Please call with questions.  Darreld Mclean, PA-C 06/01/2020, 8:39 AM

## 2020-06-01 NOTE — Op Note (Signed)
Patient name: Bradley [name] MRN: [id number] DOB: [birth year] Sex: male  06/01/2020 Pre-operative Diagnosis: Symptomatic right carotid stenosis Post-operative diagnosis:  Same Surgeon:  [name] Assistants: Laurence Slate Procedure:   #1: Right transcarotid artery revascularization (TCAR carotid stent)   #2: Flow reversal neuro protection   #3: Ultrasound-guided access, left common femoral vein Anesthesia: General Blood Loss: Minimal Specimens: None  Findings:  85% right carotid stenosis resolved after stenting  Indications: This is a 74 year old gentleman with prior history of aortobifemoral bypass graft and left carotid endarterectomy and redo left carotid endarterectomy. He recently had vision troubles in his right eye likely consistent with amaurosis. He had a CT scan that suggested a high-grade right carotid stenosis. The lesion was anatomically very high and so stenting was recommended.  Flow reversal time: 12 minutes Predilatation balloon:  5 x 4 Stent: ENROUTE 9 x 40 Postdilatation balloon: 6 x 20 Contrast volume: 20 cc Dose area: 3.06 Fluoroscopy: 3.5 minutes Procedure time: 75 minutes   Procedure:  The patient was identified in the holding area and taken to [place] [facility]  The patient was then placed supine on the table. general anesthesia was administered.  The patient was prepped and draped in the usual sterile fashion.  A time out was called and antibiotics were administered. A PA was necessary to expedite the procedure and assist with the technical details.  Ultrasound was used to evaluate the left common femoral vein which was widely patent and easily compressible. It was cannulated under ultrasound guidance with a micropuncture needle. A 018 wire was advanced without resistance followed by placement of a micropuncture sheath. The 018 wire was removed and a 035 Bentson wire was inserted. The TCAR sheath was then inserted and easily flushed and aspirated.  Next  ultrasound was used to identify the location of the right common carotid artery. A transverse incision was made just above the clavicle. Cautery was used about the subcutaneous tissue and platysma muscle. The patient had a large sternocleidomastoid muscle. I dissected out laterally and medially. The carotid artery was directly posterior to the muscle and was difficult to reach from either lateral or medial and so I bluntly separated the fibers of the sternocleidomastoid and identified the common carotid artery. This was circumferentially exposed and encircled with an umbilical tape and a Potts vessel loop. Patient was then fully heparinized. A U stitch was placed with a 5-0 Prolene in the carotid adventitia. Next the common carotid artery was cannulated with a micropuncture needle. A 018 wire was inserted up until the black line on the wire. A micropuncture sheath was inserted up to 3 cm. A contrast injection was then performed locating the carotid bifurcation. 85% internal carotid artery stenosis was identified. I then placed the carotid Amplatz wire up to the bifurcation and inserted the TCAR sheath. This was sutured in place with 2 silk 2-0 sutures. Next, a TCAR timeout was performed. The ACT was greater than 250. Systolic blood pressure was greater than 160. Active flow reversal was then initiated and confirmed with a saline flush. A 018 wire and a 5 x 40 predilatation balloon were then inserted. The wire was navigated into the internal carotid artery. The 5 x 40 balloon was then positioned over the lesion and inflated to 12 atm. There was minimal hemodynamic response. Next, a 9 x 40 ENROUTE stent was inserted and deployed. There was a residual waist at the level of the stenosis. I postdilated this with a 6 x  20 balloon. I stayed on active flow reversal for approximately 4 minutes after the stent had been deployed. Next, contrast injections were performed in 2 views. This showed resolution of the stenosis and no  access site complications. The wire was then removed and active flow reversal was discontinued. Blood was returned through the femoral sheath. The TCAR sheath was then removed and the U stitch was secured closing the arteriotomy site. The patient's heparin was reversed with 50 mg of protamine. The sheath in the groin was removed and manual pressure was held for 10 minutes. The neck incision was copiously irrigated. Hemostasis was achieved. The platysma muscle was closed with 3-0 Vicryl and the skin was closed with subcuticular stitch. Dermabond was applied. The patient was then successfully extubated from anesthesia and found be moving all 4 extremities to command.   Disposition:   To PACU stable.   Theotis Burrow, M.D., [facility] [place] Office: [phone] Pager:  [phone]

## 2020-06-01 NOTE — Transfer of Care (Signed)
Immediate Anesthesia Transfer of Care Note  Patient: Bradley [name]  Procedure(s) Performed: RIGHT TRANSCAROTID ARTERY REVASCULARIZATION (Right Neck)  Patient Location: PACU  Anesthesia Type:General  Level of Consciousness: awake, alert  and oriented  Airway & Oxygen Therapy: Patient Spontanous Breathing and Patient connected to nasal cannula oxygen  Post-op Assessment: Report given to RN, Post -op Vital signs reviewed and stable and Patient moving all extremities X 4  Post vital signs: Reviewed and stable  Last Vitals:  Vitals Value Taken Time  BP 142/73 06/01/20 1009  Temp    Pulse 85 06/01/20 1012  Resp 18 06/01/20 1012  SpO2 100 % 06/01/20 1012  Vitals shown include unvalidated device data.  Last Pain:  Vitals:   06/01/20 0650  TempSrc:   PainSc: 0-No pain      Patients Stated Pain Goal: 3 (14/70/92 9574)  Complications: No complications documented.

## 2020-06-01 NOTE — Discharge Instructions (Signed)
   [facility] [place]  Discharge Instructions   Carotid Endarterectomy (CEA)  Please refer to the following instructions for your post-procedure care. Your surgeon or physician assistant will discuss any changes with you.  Activity  You are encouraged to walk as much as you can. You can slowly return to normal activities but must avoid strenuous activity and heavy lifting until your doctor tell you it's OK. Avoid activities such as vacuuming or swinging a golf club. You can drive after one week if you are comfortable and you are no longer taking prescription pain medications. It is normal to feel tired for serval weeks after your surgery. It is also normal to have difficulty with sleep habits, eating, and bowel movements after surgery. These will go away with time.  Bathing/Showering  You may shower after you come home. Do not soak in a bathtub, hot tub, or swim until the incision heals completely.  Incision Care  Shower every day. Clean your incision with mild soap and water. Pat the area dry with a clean towel. You do not need a bandage unless otherwise instructed. Do not apply any ointments or creams to your incision. You may have skin glue on your incision. Do not peel it off. It will come off on its own in about one week. Your incision may feel thickened and raised for several weeks after your surgery. This is normal and the skin will soften over time. For Men Only: It's OK to shave around the incision but do not shave the incision itself for 2 weeks. It is common to have numbness under your chin that could last for several months.  Diet  Resume your normal diet. There are no special food restrictions following this procedure. A low fat/low cholesterol diet is recommended for all patients with vascular disease. In order to heal from your surgery, it is CRITICAL to get adequate nutrition. Your body requires vitamins, minerals, and protein. Vegetables are the best  source of vitamins and minerals. Vegetables also provide the perfect balance of protein. Processed food has little nutritional value, so try to avoid this.        Medications  Resume taking all of your medications unless your doctor or physician assistant tells you not to. If your incision is causing pain, you may take over-the- counter pain relievers such as acetaminophen (Tylenol). If you were prescribed a stronger pain medication, please be aware these medications can cause nausea and constipation. Prevent nausea by taking the medication with a snack or meal. Avoid constipation by drinking plenty of fluids and eating foods with a high amount of fiber, such as fruits, vegetables, and grains. Do not take Tylenol if you are taking prescription pain medications.  Follow Up  Our office will schedule a follow up appointment 2-3 weeks following discharge.  Please call us immediately for any of the following conditions  Increased pain, redness, drainage (pus) from your incision site. Fever of 101 degrees or higher. If you should develop stroke (slurred speech, difficulty swallowing, weakness on one side of your body, loss of vision) you should call 911 and go to the nearest emergency room.  Reduce your risk of vascular disease:  Stop smoking. If you would like help call [facility] at 1-800-QUIT-NOW (1-800-784-8669) or [place] at 336-586-4000. Manage your cholesterol Maintain a desired weight Control your diabetes Keep your blood pressure down  If you have any questions, please call the office at [phone].   

## 2020-06-01 NOTE — Progress Notes (Signed)
    S/P right TCAR No neur deficits moving all ext. No tongue deviation and no facial droop   Roxy Horseman  PA-C

## 2020-06-01 NOTE — Anesthesia Procedure Notes (Signed)
Procedure Name: Intubation Date/Time: 06/01/2020 8:07 AM Performed by: Mariea Clonts, CRNA Pre-anesthesia Checklist: Patient identified, Emergency Drugs available, Suction available and Patient being monitored Patient Re-evaluated:Patient Re-evaluated prior to induction Oxygen Delivery Method: Circle System Utilized Preoxygenation: Pre-oxygenation with 100% oxygen Induction Type: IV induction Ventilation: Mask ventilation without difficulty Laryngoscope Size: Glidescope and 4 Grade View: Grade I Tube type: Oral Tube size: 7.5 mm Number of attempts: 1 Airway Equipment and Method: Stylet,  Oral airway and Video-laryngoscopy Placement Confirmation: ETT inserted through vocal cords under direct vision,  positive ETCO2 and breath sounds checked- equal and bilateral Tube secured with: Tape Dental Injury: Teeth and Oropharynx as per pre-operative assessment

## 2020-06-02 LAB — CBC
HCT: 40.4 % (ref 39.0–52.0)
Hemoglobin: 13.2 g/dL (ref 13.0–17.0)
MCH: 32.8 pg (ref 26.0–34.0)
MCHC: 32.7 g/dL (ref 30.0–36.0)
MCV: 100.2 fL — ABNORMAL HIGH (ref 80.0–100.0)
Platelets: 186 10*3/uL (ref 150–400)
RBC: 4.03 MIL/uL — ABNORMAL LOW (ref 4.22–5.81)
RDW: 12.7 % (ref 11.5–15.5)
WBC: 9.3 10*3/uL (ref 4.0–10.5)
nRBC: 0 % (ref 0.0–0.2)

## 2020-06-02 LAB — BASIC METABOLIC PANEL
Anion gap: 10 (ref 5–15)
BUN: 26 mg/dL — ABNORMAL HIGH (ref 8–23)
CO2: 25 mmol/L (ref 22–32)
Calcium: 8.4 mg/dL — ABNORMAL LOW (ref 8.9–10.3)
Chloride: 103 mmol/L (ref 98–111)
Creatinine, Ser: 2.41 mg/dL — ABNORMAL HIGH (ref 0.61–1.24)
GFR, Estimated: 27 mL/min — ABNORMAL LOW (ref >60)
Glucose, Bld: 161 mg/dL — ABNORMAL HIGH (ref 70–99)
Potassium: 3.6 mmol/L (ref 3.5–5.1)
Sodium: 138 mmol/L (ref 135–145)

## 2020-06-02 LAB — HEMOGLOBIN A1C
Hgb A1c MFr Bld: 7.4 % — ABNORMAL HIGH (ref 4.8–5.6)
Mean Plasma Glucose: 166 mg/dL

## 2020-06-02 LAB — GLUCOSE, CAPILLARY: Glucose-Capillary: 179 mg/dL — ABNORMAL HIGH (ref 70–99)

## 2020-06-02 MED ORDER — OXYCODONE HCL 5 MG PO TABS
5.0000 mg | ORAL_TABLET | Freq: Four times a day (QID) | ORAL | 0 refills | Status: DC | PRN
Start: 2020-06-02 — End: 2020-06-07

## 2020-06-02 NOTE — Plan of Care (Signed)
  Problem: Education: Goal: Knowledge of secondary prevention will improve Outcome: Adequate for Discharge Goal: Knowledge of patient specific risk factors addressed and post discharge goals established will improve Outcome: Adequate for Discharge Goal: Individualized Educational Video(s) Outcome: Adequate for Discharge

## 2020-06-02 NOTE — Progress Notes (Signed)
Removed arterial line from patient's RIGHT BRACHIAL

## 2020-06-02 NOTE — Progress Notes (Addendum)
[facility] [place]  Subjective  - Doing well without complaints.   Objective (!) 162/76 86 97.9 F (36.6 C) (Oral) 14 92%  Intake/Output Summary (Last 24 hours) at 06/02/2020 0818 Last data filed at 06/02/2020 0400 Gross per 24 hour  Intake 2164.17 ml  Output 1550 ml  Net 614.17 ml    Moving all 4 ext Right clavicular incision healing well without hematoma No tongue deviation, or facial droop Left groin soft Lungs non labored breathing  Assessment/Planning: POD # 1 TCAR Stable for discharge without neuro deficits Plavix and ASA daily  F/u in 2-3 weeks with Dr. Cristi Loron 06/02/2020 8:18 AM --  Laboratory Lab Results: Recent Labs    06/01/20 1800 06/02/20 0510  WBC 9.9 9.3  HGB 13.3 13.2  HCT 39.9 40.4  PLT 193 186   BMET Recent Labs    06/01/20 0553 06/01/20 0553 06/01/20 1800 06/02/20 0510  NA 139  --   --  138  K 3.8  --   --  3.6  CL 103  --   --  103  CO2 23  --   --  25  GLUCOSE 195*  --   --  161*  BUN 34*  --   --  26*  CREATININE 2.64*   < > 2.62* 2.41*  CALCIUM 8.9  --   --  8.4*   < > = values in this interval not displayed.    COAG Lab Results  Component Value Date   INR 1.0 06/01/2020   INR WILL FOLLOW 10/21/2016   INR 1.0 10/21/2016   No results found for: PTT  Postoperative day #1 from right TCAR.  Incision is clean dry and intact.  He is neurologically intact.  Anticipate discharge home today.  He will be maintained on triple therapy with plans for stopping his Plavix in 1 month  [place] Athleen Feltner

## 2020-06-04 ENCOUNTER — Other Ambulatory Visit: Payer: Self-pay

## 2020-06-04 ENCOUNTER — Encounter (HOSPITAL_COMMUNITY): Payer: Self-pay | Admitting: Surgery

## 2020-06-04 DIAGNOSIS — I6523 Occlusion and stenosis of bilateral carotid arteries: Secondary | ICD-10-CM

## 2020-06-04 NOTE — Discharge Summary (Signed)
[facility] Discharge Summary   Patient ID:  Bradley [name] MRN: [id number] DOB/AGE: May 13, 1946 74 y.o.  Admit date: 06/01/2020 Discharge date: 06/02/2020 Date of Surgery: 06/01/2020 Surgeon: Surgeon(s): Serafina Mitchell, MD  Admission Diagnosis: Carotid stenosis [I65.29] Carotid stenosis, asymptomatic [I65.29]  Discharge Diagnoses:  Carotid stenosis [I65.29] Carotid stenosis, asymptomatic [I65.29]  Secondary Diagnoses: Past Medical History:  Diagnosis Date  . Acquired equinus deformity of foot 12/15/2011  . Anemia   . Arthritis   . Carotid artery occlusion    a. Carotid US 10/16: RICA 60-79%, L CEA patent with 1-39% stenosis  . CKD (chronic kidney disease)   . Coagulation disorder ([place]) 05/25/2019  . Coronary artery disease    a. Myoview 9/16:  EF 52%, inferior fixed defect consistent with diaphragmatic attenuation, intermediate risk secondary to poor exercise tolerance and symptoms during stress;  b. LHC 05/17/2015 90% mid LAD, 80% ost D2, 75% mid RCA, 35% prox RCA. >> S/p CABG  . Coronary artery disease involving coronary bypass graft of native heart with angina pectoris ([place])    CORONARY ARTERY BYPASS GRAFTING x 3 - 05/23/2015 Left internal mammary artery to left anterior descending  Saphenous vein graft to diagonal  Saphenous vein graft to posterior descending Endoscopic greater saphenous vein harvest right thigh   . Diabetes mellitus age 20  . Diverticulitis   . Diverticulosis of colon without hemorrhage 05/17/2015   By CT scan   . DM type 2, controlled, with complication ([place]) 1/51/7616  . Essential hypertension 04/29/2015  . GERD (gastroesophageal reflux disease)   . H/O hiatal hernia   . History of echocardiogram    a. Echo 9/16: GLS -15.2%, EF 07-37%, grade 1 diastolic dysfunction, normal wall motion, aortic sclerosis, dilated aortic root 39 mm, atrial septal lipomatous hypertrophy  . Hyperlipidemia   . Hypertension   . Irritable bowel syndrome  (IBS)   . Myocardial infarction [facility])    silent inferior MI; patient denies MI history (03/17/13)   . Neuropathy 2013  . OSA on CPAP 05/30/2015  . Osteoporosis 2013  . PAF (paroxysmal atrial fibrillation) ([place])    post CABG 2016; Amiodarone stopped 2/2 wheezing; PAF 01/12/20  . Peripheral vascular disease ([place])   . Plantar fasciitis 12/15/2011  . Reflux esophagitis   . Right bundle branch block 04/30/2015  . Sleep apnea    uses CPAP  . Stenosis of right internal carotid artery with cerebral infarction ([place]) 12/09/2011   Right brain CVA 1987   . Stress fracture of metatarsal bone 02/18/2012  . Stroke [facility]) 1987   Right brain stroke  . Syncope 01/12/2020  . Type 2 diabetes mellitus with vascular disease ([place]) 05/30/2015  . Wears glasses     Procedure(s): RIGHT TRANSCAROTID ARTERY REVASCULARIZATION  Discharged Condition: stable  HPI: 74 y/o male with surgical history of aortobifemoral bypass graft for lower extremity occlusive disease in 1998 and a left carotid endarterectomy in 1994 with a redo in 2001.  Possible right sided amaurosis fugax: I discussed with the patient that the duplex ultrasound at [facility] and here show only 40-59% stenosis.  However, his CT scan shows 95% stenosis on the right.   Planned right TCAR intervention.   Hospital Course:  Bradley [name] is a 74 y.o. male is S/P  Procedure(s): RIGHT TRANSCAROTID ARTERY REVASCULARIZATION   Uneventful over night admission.  No neurologic deficits.  Discharged in stable condition on Plavix and ASA daily  F/u in 2-3 weeks with Dr. Trula Slade.  Significant Diagnostic Studies: CBC Lab Results  Component Value Date   WBC 9.3 06/02/2020   HGB 13.2 06/02/2020   HCT 40.4 06/02/2020   MCV 100.2 (H) 06/02/2020   PLT 186 06/02/2020    BMET    Component Value Date/Time   NA 138 06/02/2020 0510   NA 134 02/01/2020 1219   K 3.6 06/02/2020 0510   CL 103 06/02/2020 0510   CO2 25 06/02/2020 0510   GLUCOSE 161 (H) 06/02/2020 0510    BUN 26 (H) 06/02/2020 0510   BUN 21 02/01/2020 1219   CREATININE 2.41 (H) 06/02/2020 0510   CREATININE 1.76 (H) 09/19/2015 1111   CALCIUM 8.4 (L) 06/02/2020 0510   GFRNONAA 27 (L) 06/02/2020 0510   GFRAA 41 (L) 02/01/2020 1219   COAG Lab Results  Component Value Date   INR 1.0 06/01/2020   INR WILL FOLLOW 10/21/2016   INR 1.0 10/21/2016     Disposition:  Discharge to :Home Discharge Instructions    Call MD for:  redness, tenderness, or signs of infection (pain, swelling, bleeding, redness, odor or green/yellow discharge around incision site)   Complete by: As directed    Call MD for:  severe or increased pain, loss or decreased feeling  in affected limb(s)   Complete by: As directed    Call MD for:  temperature >100.5   Complete by: As directed    Resume previous diet   Complete by: As directed      Allergies as of 06/02/2020      Reactions   Hydrocodone Nausea Only   Oxycodone Nausea Only   Penicillins Rash   Has patient had a PCN reaction causing immediate rash, facial/tongue/throat swelling, SOB or lightheadedness with hypotension: NO Has patient had a PCN reaction causing severe rash involving mucus membranes or skin necrosis:NO Has patient had a PCN reaction that required hospitalization NO Has patient had a PCN reaction occurring within the last 10 years: NO If all of the above answers are "NO", then may proceed with Cephalosporin use.   Sulfa Drugs Cross Reactors Rash      Medication List    TAKE these medications   amLODipine 10 MG tablet Commonly known as: NORVASC Take 1 tablet (10 mg total) by mouth daily.   apixaban 5 MG Tabs tablet Commonly known as: ELIQUIS Take 1 tablet (5 mg total) by mouth 2 (two) times daily.   aspirin EC 81 MG tablet Take 81 mg by mouth daily. Swallow whole.   atorvastatin 40 MG tablet Commonly known as: LIPITOR Take 40 mg by mouth daily.   clopidogrel 75 MG tablet Commonly known as: PLAVIX Take 1 tablet (75 mg  total) by mouth daily.   Dexilant 60 MG capsule Generic drug: dexlansoprazole Take 60 mg by mouth daily.   Fiasp FlexTouch 100 UNIT/ML FlexTouch Pen Generic drug: insulin aspart Inject 20-25 Units into the skin in the morning, at noon, and at bedtime. Sliding scale as per the carbohydrate intake.   Foltanx RF 3-90.314-2-35 MG Caps Take 1 capsule by mouth in the morning and at bedtime.   gabapentin 300 MG capsule Commonly known as: NEURONTIN Take 600 mg by mouth at bedtime.   hydrocortisone 2.5 % cream Apply 1 application topically 2 (two) times daily.   ketoconazole 2 % shampoo Commonly known as: NIZORAL Apply 1 application topically daily as needed for irritation.   methocarbamol 500 MG tablet Commonly known as: ROBAXIN Take 500 mg by mouth every 6 (six) hours as needed  for muscle spasms.   metoprolol tartrate 50 MG tablet Commonly known as: LOPRESSOR Take 1 tablet (50 mg total) by mouth 2 (two) times daily. Please call [phone] for appointment for more refills thanks. FINAL attempt   nitroGLYCERIN 0.3 MG SL tablet Commonly known as: NITROSTAT Place 0.3 mg under the tongue every 5 (five) minutes as needed for chest pain. Only as needed   oxyCODONE 5 MG immediate release tablet Commonly known as: Oxy IR/ROXICODONE Take 1 tablet (5 mg total) by mouth every 6 (six) hours as needed for moderate pain.   Proctosol HC 2.5 % rectal cream Generic drug: hydrocortisone Place 1 application rectally daily as needed for hemorrhoids.   testosterone cypionate 200 MG/ML injection Commonly known as: DEPOTESTOSTERONE CYPIONATE Inject 200 mg into the muscle once a week. 0.6 ml   torsemide 20 MG tablet Commonly known as: DEMADEX Take 1 tablet (20 mg total) by mouth daily as needed (swelling or weight gain of 2 or more pounds overnight.). What changed: when to take this   [place] FlexTouch 100 UNIT/ML FlexTouch Pen Generic drug: insulin degludec Inject 20-25 Units into the skin  daily.   Trulicity 2.45 YK/9.9IP Sopn Generic drug: Dulaglutide Inject 0.75 mg into the skin once a week. Sunday morning   Vitamin D3 125 MCG (5000 UT) Tabs Take 5,000 Units by mouth daily.      Verbal and written Discharge instructions given to the patient. Wound care per Discharge AVS  Follow-up Information    Serafina Mitchell, MD In 3 weeks.   Specialties: Vascular Surgery, Cardiology Why: Office will call you to arrange your appt (sent) Contact information: [place] [address] [phone]               Signed: Roxy Horseman 06/04/2020, 10:17 AM --- For VQI Registry use --- Instructions: Press F2 to tab through selections.  Delete question if not applicable.   Modified Rankin score at D/C (0-6): Rankin Score=0  IV medication needed for:  1. Hypertension: No 2. Hypotension: No  Post-op Complications: No  1. Post-op CVA or TIA: No  If yes: Event classification (right eye, left eye, right cortical, left cortical, verterobasilar, other):   If yes: Timing of event (intra-op, <6 hrs post-op, >=6 hrs post-op, unknown):   2. CN injury: No  If yes: CN  injuried   3. Myocardial infarction: No  If yes: Dx by (EKG or clinical, Troponin):   4.  CHF: No  5.  Dysrhythmia (new): No  6. Wound infection: No  7. Reperfusion symptoms: No  8. Return to OR: No  If yes: return to OR for (bleeding, neurologic, other CEA incision, other):   Discharge medications: Statin use:  Yes ASA use:  Yes Beta blocker use:  Yes ACE-Inhibitor use:  No  for medical reason  not indicated P2Y12 Antagonist use: [ ]  None, [x ] Plavix, [ ]  Plasugrel, [ ]  Ticlopinine, [ ]  Ticagrelor, [ ]  Other, [ ]  No for medical reason, [ ]  Non-compliant, [ ]  Not-indicated Anti-coagulant use:  [x ] None, [ ]  Warfarin, [ ]  Rivaroxaban, [ ]  Dabigatran, [ ]  Other, [ ]  No for medical reason, [ ]  Non-compliant, [ ]  Not-indicated

## 2020-06-05 NOTE — Progress Notes (Signed)
Cardiology Office Note:    Date:  06/07/2020   ID:  Bradley [name], DOB July 27, 1946, MRN [id number]  PCP:  Bradley Cruel, MD  Cardiologist:  Bradley Grooms, MD   Referring MD: Bradley Cruel, MD   Chief Complaint  Patient presents with  . Coronary Artery Disease  . Hypertension  . Hyperlipidemia  . Follow-up    CKD stage    History of Present Illness:    Bradley [name] is a 74 y.o. male with a hx of CAD s/p CABG [id number],ONGEXBM combined systolic and diastolic heart failure,HTN, HLD, OSA, PAD with L CEA x2and aorto-bifem bypass, OSA on CPAP,and DMT2 who was seen most recentlyfor evaluation of chest pain and SOB.Recent admission for near syncope related to multifactorial etiology including meds, diarrhea, & neuropathy. New onset PAF leading to Eliquis and discontinuation of plavix/aggrenox (PAD). Had TCAR 06/01/2020.  Overall, doing well since having TCAR of right carotid by Dr. Trula [name] last week.  Had an overnight stay in the hospital.  No cardiac complications.  He is having increasing difficulty with blood pressure control and also worsening kidney function with CKD stage IV status currently.  States that Dr. Marval [name] wanted to add additional medication for blood pressure control prior to the carotid surgical procedure.  He deferred however until he got beyond therapy without complications hoping not to lower the blood pressure and perhaps cause neurological injury.  Bradley [name] has no chest pain.  He has had some dyspnea on exertion.  He brings in a log of his blood pressures from home which have ranged between 135/70 - 195/95 mmHg.  Past Medical History:  Diagnosis Date  . Acquired equinus deformity of foot 12/15/2011  . Anemia   . Arthritis   . Carotid artery occlusion    a. Carotid US 10/16: RICA 60-79%, L CEA patent with 1-39% stenosis  . CKD (chronic kidney disease)   . Coagulation disorder ([place]) 05/25/2019  . Coronary artery disease    a. Myoview 9/16:  EF  52%, inferior fixed defect consistent with diaphragmatic attenuation, intermediate risk secondary to poor exercise tolerance and symptoms during stress;  b. LHC 05/17/2015 90% mid LAD, 80% ost D2, 75% mid RCA, 35% prox RCA. >> S/p CABG  . Coronary artery disease involving coronary bypass graft of native heart with angina pectoris ([place])    CORONARY ARTERY BYPASS GRAFTING x 3 - 05/23/2015 Left internal mammary artery to left anterior descending  Saphenous vein graft to diagonal  Saphenous vein graft to posterior descending Endoscopic greater saphenous vein harvest right thigh   . Diabetes mellitus age 54  . Diverticulitis   . Diverticulosis of colon without hemorrhage 05/17/2015   By CT scan   . DM type 2, controlled, with complication ([place]) 8/41/3244  . Essential hypertension 04/29/2015  . GERD (gastroesophageal reflux disease)   . H/O hiatal hernia   . History of echocardiogram    a. Echo 9/16: GLS -15.2%, EF 01-02%, grade 1 diastolic dysfunction, normal wall motion, aortic sclerosis, dilated aortic root 39 mm, atrial septal lipomatous hypertrophy  . Hyperlipidemia   . Hypertension   . Irritable bowel syndrome (IBS)   . Myocardial infarction [facility])    silent inferior MI; patient denies MI history (03/17/13)   . Neuropathy 2013  . OSA on CPAP 05/30/2015  . Osteoporosis 2013  . PAF (paroxysmal atrial fibrillation) ([place])    post CABG 2016; Amiodarone stopped 2/2 wheezing; PAF 01/12/20  . Peripheral vascular disease ([place])   .  Plantar fasciitis 12/15/2011  . Reflux esophagitis   . Right bundle branch block 04/30/2015  . Sleep apnea    uses CPAP  . Stenosis of right internal carotid artery with cerebral infarction ([place]) 12/09/2011   Right brain CVA 1987   . Stress fracture of metatarsal bone 02/18/2012  . Stroke [facility]) 1987   Right brain stroke  . Syncope 01/12/2020  . Type 2 diabetes mellitus with vascular disease ([place]) 05/30/2015  . Wears glasses     Past Surgical History:  Procedure Laterality  Date  . CARDIAC CATHETERIZATION N/A 05/17/2015   Procedure: Left Heart Cath and Coronary Angiography;  Surgeon: Belva Crome, MD;  Location: [facility];  Service: Cardiovascular;  Laterality: N/A;  . CAROTID ENDARTERECTOMY  1994 & redo 2001   Left  . COLONOSCOPY W/ BIOPSIES AND POLYPECTOMY    . CORONARY ARTERY BYPASS GRAFT N/A 05/23/2015   Procedure: CORONARY ARTERY BYPASS GRAFTING (CABG) x 3 (LIMA to LAD, SVG to DIAGONAL 2, SVG to PDA) with Endoscopic Vein Harvesting from right greater saphenous vein;  Surgeon: Grace Isaac, MD;  Location: [facility];  Service: Open Heart Surgery;  Laterality: N/A;  . FRACTURE SURGERY  2013   Right   foo  t X's 2  . ILIAC ARTERY STENT    . LAPAROSCOPIC CHOLECYSTECTOMY  09/11/2016  . POSTERIOR LUMBAR FUSION  Aug. 14, 2014   Level 1  . PR VEIN BYPASS GRAFT,AORTO-FEM-POP  1998  . [place] SURGERY  2014  . TEE WITHOUT CARDIOVERSION N/A 05/23/2015   Procedure: TRANSESOPHAGEAL ECHOCARDIOGRAM (TEE);  Surgeon: Grace Isaac, MD;  Location: [facility];  Service: Open Heart Surgery;  Laterality: N/A;  . TONSILLECTOMY    . TRANSCAROTID ARTERY REVASCULARIZATION Right 06/01/2020   Procedure: RIGHT TRANSCAROTID ARTERY REVASCULARIZATION;  Surgeon: Serafina Mitchell, MD;  Location: [facility] OR;  Service: Vascular;  Laterality: Right;  . TRIGGER FINGER RELEASE Right 07/14/2017   Procedure: RIGHT LONG FINGER TRIGGER RELEASE;  Surgeon: Milly Jakob, MD;  Location: [facility];  Service: Orthopedics;  Laterality: Right;    Current Medications: Current Meds  Medication Sig  . amLODipine (NORVASC) 10 MG tablet Take 1 tablet (10 mg total) by mouth daily.  Marland Kitchen apixaban (ELIQUIS) 5 MG TABS tablet Take 1 tablet (5 mg total) by mouth 2 (two) times daily.  Marland Kitchen aspirin EC 81 MG tablet Take 81 mg by mouth daily. Swallow whole.  Marland Kitchen atorvastatin (LIPITOR) 40 MG tablet Take 40 mg by mouth daily.    . Cholecalciferol (VITAMIN D3) 125 MCG (5000 UT) TABS Take 5,000 Units by  mouth daily.   . clopidogrel (PLAVIX) 75 MG tablet Take 1 tablet (75 mg total) by mouth daily.  Marland Kitchen dexlansoprazole (DEXILANT) 60 MG capsule Take 60 mg by mouth daily.   Marland Kitchen FIASP FLEXTOUCH 100 UNIT/ML SOPN Inject 20-25 Units into the skin in the morning, at noon, and at bedtime. Sliding scale as per the carbohydrate intake.  . gabapentin (NEURONTIN) 300 MG capsule Take 600 mg by mouth at bedtime.   . hydrocortisone (PROCTOSOL HC) 2.5 % rectal cream Place 1 application rectally daily as needed for hemorrhoids.   . hydrocortisone 2.5 % cream Apply 1 application topically 2 (two) times daily.  Marland Kitchen ketoconazole (NIZORAL) 2 % shampoo Apply 1 application topically daily as needed for irritation.   Marland Kitchen L-Methylfolate-Algae-B12-B6 (FOLTANX RF) 3-90.314-2-35 MG CAPS Take 1 capsule by mouth in the morning and at bedtime.   . methocarbamol (ROBAXIN) 500 MG tablet  Take 500 mg by mouth every 6 (six) hours as needed for muscle spasms.   . metoprolol tartrate (LOPRESSOR) 50 MG tablet Take 1.5 tablets (75 mg total) by mouth 2 (two) times daily.  . nitroGLYCERIN (NITROSTAT) 0.3 MG SL tablet Place 0.3 mg under the tongue every 5 (five) minutes as needed for chest pain. Only as needed  . testosterone cypionate (DEPOTESTOSTERONE CYPIONATE) 200 MG/ML injection Inject 200 mg into the muscle once a week. 0.6 ml  . torsemide (DEMADEX) 20 MG tablet Take 1 tablet (20 mg total) by mouth daily as needed (swelling or weight gain of 2 or more pounds overnight.).  Marland Kitchen TRESIBA FLEXTOUCH 100 UNIT/ML SOPN FlexTouch Pen Inject 20-25 Units into the skin daily.   . TRULICITY 1.32 GM/0.1UU SOPN Inject 0.75 mg into the skin once a week. Sunday morning  . [DISCONTINUED] metoprolol tartrate (LOPRESSOR) 50 MG tablet Take 1 tablet (50 mg total) by mouth 2 (two) times daily. Please call [phone] for appointment for more refills thanks. FINAL attempt     Allergies:   Hydrocodone, Oxycodone, Penicillins, and Sulfa drugs cross reactors   Social  History   Socioeconomic History  . Marital status: Married    Spouse name: Not on file  . Number of children: Not on file  . Years of education: Not on file  . Highest education level: Not on file  Occupational History  . Not on file  Tobacco Use  . Smoking status: Former Smoker    Packs/day: 2.00    Years: 40.00    Pack years: 80.00    Types: Cigarettes    Quit date: 03/26/2009    Years since quitting: 11.2  . Smokeless tobacco: Never Used  Vaping Use  . Vaping Use: Never used  Substance and Sexual Activity  . Alcohol use: No  . Drug use: No  . Sexual activity: Not on file  Other Topics Concern  . Not on file  Social History Narrative  . Not on file   Social Determinants of Health   Financial Resource Strain:   . Difficulty of Paying Living Expenses: Not on file  Food Insecurity:   . Worried About Charity fundraiser in the Last Year: Not on file  . Ran Out of Food in the Last Year: Not on file  Transportation Needs:   . Lack of Transportation (Medical): Not on file  . Lack of Transportation (Non-Medical): Not on file  Physical Activity:   . Days of Exercise per Week: Not on file  . Minutes of Exercise per Session: Not on file  Stress:   . Feeling of Stress : Not on file  Social Connections:   . Frequency of Communication with Friends and Family: Not on file  . Frequency of Social Gatherings with Friends and Family: Not on file  . Attends Religious Services: Not on file  . Active Member of Clubs or Organizations: Not on file  . Attends Archivist Meetings: Not on file  . Marital Status: Not on file     Family History: The patient's family history includes Cancer (age of onset: 30) in his mother; Deep vein thrombosis in his father; Hyperlipidemia in his father and mother; Hypertension in his father; Stroke (age of onset: 53) in his father.  ROS:   Please see the history of present illness.    No neurological complaints.  Has had lower extremity  swelling.  Dyspnea on exertion has been somewhat bothersome.  He is compliant with his  medications.  All other systems reviewed and are negative.  EKGs/Labs/Other Studies Reviewed:    The following studies were reviewed today: 2D Doppler echocardiogram performed June 2021: IMPRESSIONS    1. Inferior basal hypokinesis . Left ventricular ejection fraction, by  estimation, is 55%. The left ventricle has normal function. The left  ventricle has no regional wall motion abnormalities. Left ventricular  diastolic parameters are consistent with  Grade I diastolic dysfunction (impaired relaxation).  2. Right ventricular systolic function was not well visualized. The right  ventricular size is not well visualized.  3. The mitral valve is normal in structure. Trivial mitral valve  regurgitation. No evidence of mitral stenosis.  4. The aortic valve was not well visualized. Aortic valve regurgitation  is not visualized. Moderate sclerosis and calcification without stenosis.  5. The inferior vena cava is normal in size with greater than 50%  respiratory variability, suggesting right atrial pressure of 3 mmHg.   EKG:  EKG performed February 01, 2020, normal sinus rhythm without acute abnormality.  Tracing is not repeated today.  Recent Labs: 08/16/2019: NT-Pro BNP 143 06/01/2020: ALT 19 06/02/2020: BUN 26; Creatinine, Ser 2.41; Hemoglobin 13.2; Platelets 186; Potassium 3.6; Sodium 138  Recent Lipid Panel    Component Value Date/Time   CHOL 82 06/01/2015 0315   TRIG 158 (H) 06/01/2015 0315   HDL 24 (L) 06/01/2015 0315   CHOLHDL 3.4 06/01/2015 0315   VLDL 32 06/01/2015 0315   LDLCALC 26 06/01/2015 0315    Physical Exam:    VS:  BP 130/66   Pulse 74   Ht 5' 10.98" (1.803 m)   Wt 219 lb (99.3 kg)   SpO2 93%   BMI 30.56 kg/m     Wt Readings from Last 3 Encounters:  06/07/20 219 lb (99.3 kg)  06/01/20 220 lb (99.8 kg)  05/21/20 223 lb (101.2 kg)     GEN: Abdominal obesity. No acute  distress HEENT: Normal NECK: No JVD.Marland Kitchen  Healing right supraclavicular horizontal incision which was the site for TCAR. LYMPHATICS: No lymphadenopathy CARDIAC:  RRR without murmur, gallop, or edema. VASCULAR:  Normal Pulses. No bruits. RESPIRATORY:  Clear to auscultation without rales, wheezing or rhonchi  ABDOMEN: Soft, non-tender, non-distended, No pulsatile mass, MUSCULOSKELETAL: No deformity  SKIN: Warm and dry NEUROLOGIC:  Alert and oriented x 3 PSYCHIATRIC:  Normal affect   ASSESSMENT:    1. Bilateral extracranial carotid artery stenosis   2. PAF (paroxysmal atrial fibrillation) ([place])   3. Coronary artery disease involving coronary bypass graft of native heart with angina pectoris ([place])   4. Right bundle branch block   5. Congestive heart failure, unspecified HF chronicity, unspecified heart failure type ([employer])   6. Essential hypertension   7. Hyperlipidemia, unspecified hyperlipidemia type   8. OSA on CPAP   9. Type 2 diabetes mellitus with vascular disease ([name])   10. CKD (chronic kidney disease) stage 4, GFR 15-29 ml/min ([name])   11. PAOD (peripheral arterial occlusive disease) ([place])   12. Educated about COVID-19 virus infection   13. Coronary artery disease involving native coronary artery of native heart without angina pectoris    PLAN:    In order of problems listed above:  1. Status post recent TCAR without neurological deficits. 2. No clinical evidence of atrial fibrillation currently.  Continue Eliquis. 3. Continue aspirin, Lipitor, and beta-blocker therapy. 4. Not discussed 5. Treat with increasing diuretic as needed to control volume status. 6. Blood pressure needs better control.  Though it was  measured at 130/66, when I repeated it it was 154/90 mmHg.  Further increase metoprolol to 75 mg twice daily based upon his from home blood pressure recordings which are all elevated.  Additional increase in intensity of diuretic therapy may be needed or hydralazine could be  used.  I will defer to Dr. Arty Baumgartner.  The patient understands that I prefer to defer to Nephrologist in this particular setting with worsening kidney function. 7. Continue Lipitor 40 mg/day 8. Encourage CPAP use 9. A1c target less than 7.  7.5 when last checked earlier this month. 10. Per Dr. Donato Heinz. 11. Denies claudication currently. 59. Vaccinated and practicing social mitigation  80-month follow-up.  Options to better control blood pressure include increasing diuretic, hydralazine, and today we have increased metoprolol to 75 mg twice daily.  At this point with CKD stage IV, defer blood pressure management to Dr. Loletha Grayer.  Medication Adjustments/Labs and Tests Ordered: Current medicines are reviewed at length with the patient today.  Concerns regarding medicines are outlined above.  No orders of the defined types were placed in this encounter.  Meds ordered this encounter  Medications  . metoprolol tartrate (LOPRESSOR) 50 MG tablet    Sig: Take 1.5 tablets (75 mg total) by mouth 2 (two) times daily.    Dispense:  135 tablet    Refill:  1    Patient Instructions  Medication Instructions:  1) INCREASE Metoprolol Tartrate 75mg  twice daily  *If you need a refill on your cardiac medications before your next appointment, please call your pharmacy*   Lab Work: None If you have labs (blood work) drawn today and your tests are completely normal, you will receive your results only by: Marland Kitchen MyChart Message (if you have MyChart) OR . A paper copy in the mail If you have any lab test that is abnormal or we need to change your treatment, we will call you to review the results.   Testing/Procedures: None   Follow-Up: At [facility], you and your health needs are our priority.  As part of our continuing mission to provide you with exceptional heart care, we have created designated Provider Care Teams.  These Care Teams include your primary Cardiologist (physician) and Advanced  Practice Providers (APPs -  Physician Assistants and Nurse Practitioners) who all work together to provide you with the care you need, when you need it.  We recommend signing up for the patient portal called "MyChart".  Sign up information is provided on this After Visit Summary.  MyChart is used to connect with patients for Virtual Visits (Telemedicine).  Patients are able to view lab/test results, encounter notes, upcoming appointments, etc.  Non-urgent messages can be sent to your provider as well.   To learn more about what you can do with MyChart, go to [email or web address].    Your next appointment:   2 month(s)  The format for your next appointment:   In Person  Provider:   You may see Bradley Grooms, MD or one of the following Advanced Practice Providers on your designated Care Team:    Truitt Merle, NP  Cecilie Kicks, NP  Kathyrn Drown, NP    Other Instructions      Signed, Bradley Grooms, MD  06/07/2020 4:36 PM    [place]

## 2020-06-07 ENCOUNTER — Encounter: Payer: Self-pay | Admitting: Interventional Cardiology

## 2020-06-07 ENCOUNTER — Ambulatory Visit: Payer: PPO | Admitting: Interventional Cardiology

## 2020-06-07 ENCOUNTER — Other Ambulatory Visit: Payer: Self-pay

## 2020-06-07 VITALS — BP 130/66 | HR 74 | Ht 70.98 in | Wt 219.0 lb

## 2020-06-07 DIAGNOSIS — E785 Hyperlipidemia, unspecified: Secondary | ICD-10-CM

## 2020-06-07 DIAGNOSIS — I509 Heart failure, unspecified: Secondary | ICD-10-CM

## 2020-06-07 DIAGNOSIS — I480 Paroxysmal atrial fibrillation: Secondary | ICD-10-CM | POA: Diagnosis not present

## 2020-06-07 DIAGNOSIS — N184 Chronic kidney disease, stage 4 (severe): Secondary | ICD-10-CM

## 2020-06-07 DIAGNOSIS — I6523 Occlusion and stenosis of bilateral carotid arteries: Secondary | ICD-10-CM | POA: Diagnosis not present

## 2020-06-07 DIAGNOSIS — I25709 Atherosclerosis of coronary artery bypass graft(s), unspecified, with unspecified angina pectoris: Secondary | ICD-10-CM

## 2020-06-07 DIAGNOSIS — Z9989 Dependence on other enabling machines and devices: Secondary | ICD-10-CM

## 2020-06-07 DIAGNOSIS — Z7189 Other specified counseling: Secondary | ICD-10-CM

## 2020-06-07 DIAGNOSIS — I10 Essential (primary) hypertension: Secondary | ICD-10-CM | POA: Diagnosis not present

## 2020-06-07 DIAGNOSIS — E1159 Type 2 diabetes mellitus with other circulatory complications: Secondary | ICD-10-CM | POA: Diagnosis not present

## 2020-06-07 DIAGNOSIS — I4510 Unspecified right bundle-branch block: Secondary | ICD-10-CM

## 2020-06-07 DIAGNOSIS — G4733 Obstructive sleep apnea (adult) (pediatric): Secondary | ICD-10-CM | POA: Diagnosis not present

## 2020-06-07 DIAGNOSIS — I779 Disorder of arteries and arterioles, unspecified: Secondary | ICD-10-CM | POA: Diagnosis not present

## 2020-06-07 DIAGNOSIS — I2510 Atherosclerotic heart disease of native coronary artery without angina pectoris: Secondary | ICD-10-CM

## 2020-06-07 MED ORDER — METOPROLOL TARTRATE 50 MG PO TABS: 75.0000 mg | ORAL_TABLET | Freq: Two times a day (BID) | ORAL | 1 refills | Status: DC

## 2020-06-07 NOTE — Patient Instructions (Signed)
Medication Instructions:  1) INCREASE Metoprolol Tartrate 75mg  twice daily  *If you need a refill on your cardiac medications before your next appointment, please call your pharmacy*   Lab Work: None If you have labs (blood work) drawn today and your tests are completely normal, you will receive your results only by: Marland Kitchen MyChart Message (if you have MyChart) OR . A paper copy in the mail If you have any lab test that is abnormal or we need to change your treatment, we will call you to review the results.   Testing/Procedures: None   Follow-Up: At [facility], you and your health needs are our priority.  As part of our continuing mission to provide you with exceptional heart care, we have created designated Provider Care Teams.  These Care Teams include your primary Cardiologist (physician) and Advanced Practice Providers (APPs -  Physician Assistants and Nurse Practitioners) who all work together to provide you with the care you need, when you need it.  We recommend signing up for the patient portal called "MyChart".  Sign up information is provided on this After Visit Summary.  MyChart is used to connect with patients for Virtual Visits (Telemedicine).  Patients are able to view lab/test results, encounter notes, upcoming appointments, etc.  Non-urgent messages can be sent to your provider as well.   To learn more about what you can do with MyChart, go to [email or web address].    Your next appointment:   2 month(s)  The format for your next appointment:   In Person  Provider:   You may see Sinclair Grooms, MD or one of the following Advanced Practice Providers on your designated Care Team:    Truitt Merle, NP  Cecilie Kicks, NP  Kathyrn Drown, NP    Other Instructions

## 2020-06-09 DIAGNOSIS — E1165 Type 2 diabetes mellitus with hyperglycemia: Secondary | ICD-10-CM | POA: Diagnosis not present

## 2020-06-13 DIAGNOSIS — R946 Abnormal results of thyroid function studies: Secondary | ICD-10-CM | POA: Diagnosis not present

## 2020-06-13 DIAGNOSIS — R58 Hemorrhage, not elsewhere classified: Secondary | ICD-10-CM | POA: Diagnosis not present

## 2020-06-13 DIAGNOSIS — I779 Disorder of arteries and arterioles, unspecified: Secondary | ICD-10-CM | POA: Diagnosis not present

## 2020-06-13 DIAGNOSIS — I6529 Occlusion and stenosis of unspecified carotid artery: Secondary | ICD-10-CM | POA: Diagnosis not present

## 2020-06-13 DIAGNOSIS — Z09 Encounter for follow-up examination after completed treatment for conditions other than malignant neoplasm: Secondary | ICD-10-CM | POA: Diagnosis not present

## 2020-06-15 ENCOUNTER — Other Ambulatory Visit: Payer: Self-pay | Admitting: Family Medicine

## 2020-06-15 DIAGNOSIS — R946 Abnormal results of thyroid function studies: Secondary | ICD-10-CM

## 2020-06-18 ENCOUNTER — Ambulatory Visit (INDEPENDENT_AMBULATORY_CARE_PROVIDER_SITE_OTHER): Payer: PPO | Admitting: Surgery

## 2020-06-18 ENCOUNTER — Other Ambulatory Visit: Payer: Self-pay

## 2020-06-18 ENCOUNTER — Encounter: Payer: Self-pay | Admitting: Surgery

## 2020-06-18 ENCOUNTER — Ambulatory Visit (HOSPITAL_COMMUNITY)
Admission: RE | Admit: 2020-06-18 | Discharge: 2020-06-18 | Disposition: A | Discharge status: Home / Self Care | Payer: PPO | Source: Ambulatory Visit | Attending: Surgery | Admitting: Surgery

## 2020-06-18 VITALS — BP 132/82 | HR 73 | Temp 98.1°F | Resp 20 | Ht 70.0 in | Wt 216.0 lb

## 2020-06-18 DIAGNOSIS — I6523 Occlusion and stenosis of bilateral carotid arteries: Secondary | ICD-10-CM

## 2020-06-18 NOTE — Progress Notes (Signed)
Patient name: Bradley [name] MRN: [id number] DOB: 13-Dec-1945 Sex: male  REASON FOR VISIT:     postop  HISTORY OF PRESENT ILLNESS:   Bradley [name] is a 74 y.o. male who is back for follow-up.  He has a long-term patient of Dr. Donnetta Hutching who has performed an aortobifemoral bypass graft for lower extremity occlusive disease in 1998 and a left carotid endarterectomy in 1994 with a redo in 2001.  He was taking his wife to [facility] for a cardiac ablation last week and on the trip down he noticed that his vision in his right eye was blurry.  While he was down there he started to shake and was found to be hypertensive with a blood pressure of 195/95.  He was sent to the emergency room.  He did not have any other localizing symptoms they did a stroke work-up which consisted of a CT scan and carotid Dopplers.  By report the CT scan showed a 95% right carotid stenosis.  Doppler studies were in the 40 to 59% range.  His symptoms had resolved.    On 06/01/2020 he underwent right sided TCAR.  Angiographic imaging revealed an 85% stenosis.  This resolved after stenting.  He is without any neurologic issues.  He is back today for follow-up   Patient suffers from coronary artery disease, status post CABG on 05/23/2015.  He takes a statin for hypercholesterolemia.  He is a diabetic which is moderately well controlled.  He is a former smoker having quit in 2010.  He is medically managed for hypertension.  He is on Eliquis for atrial fibrillation  CURRENT MEDICATIONS:    Current Outpatient Medications  Medication Sig Dispense Refill  . amLODipine (NORVASC) 10 MG tablet Take 1 tablet (10 mg total) by mouth daily. 90 tablet 3  . apixaban (ELIQUIS) 5 MG TABS tablet Take 1 tablet (5 mg total) by mouth 2 (two) times daily. 180 tablet 3  . aspirin EC 81 MG tablet Take 81 mg by mouth daily. Swallow whole.    Marland Kitchen atorvastatin (LIPITOR) 40 MG tablet Take 40 mg by mouth daily.      .  Cholecalciferol (VITAMIN D3) 125 MCG (5000 UT) TABS Take 5,000 Units by mouth daily.     . clopidogrel (PLAVIX) 75 MG tablet Take 1 tablet (75 mg total) by mouth daily. 30 tablet 1  . dexlansoprazole (DEXILANT) 60 MG capsule Take 60 mg by mouth daily.     Marland Kitchen FIASP FLEXTOUCH 100 UNIT/ML SOPN Inject 20-25 Units into the skin in the morning, at noon, and at bedtime. Sliding scale as per the carbohydrate intake.    . gabapentin (NEURONTIN) 300 MG capsule Take 600 mg by mouth at bedtime.     . hydrocortisone (PROCTOSOL HC) 2.5 % rectal cream Place 1 application rectally daily as needed for hemorrhoids.     . hydrocortisone 2.5 % cream Apply 1 application topically 2 (two) times daily.    Marland Kitchen ketoconazole (NIZORAL) 2 % shampoo Apply 1 application topically daily as needed for irritation.     Marland Kitchen L-Methylfolate-Algae-B12-B6 (FOLTANX RF) 3-90.314-2-35 MG CAPS Take 1 capsule by mouth in the morning and at bedtime.     . methocarbamol (ROBAXIN) 500 MG tablet Take 500 mg by mouth every 6 (six) hours as needed for muscle spasms.     . metoprolol tartrate (LOPRESSOR) 50 MG tablet Take 1.5 tablets (75 mg total) by mouth 2 (two) times daily. 135 tablet 1  . nitroGLYCERIN (NITROSTAT) 0.3 MG  SL tablet Place 0.3 mg under the tongue every 5 (five) minutes as needed for chest pain. Only as needed    . testosterone cypionate (DEPOTESTOSTERONE CYPIONATE) 200 MG/ML injection Inject 200 mg into the muscle once a week. 0.6 ml    . TRESIBA FLEXTOUCH 100 UNIT/ML SOPN FlexTouch Pen Inject 20-25 Units into the skin daily.     . TRULICITY 2.44 QK/8.6NO SOPN Inject 0.75 mg into the skin once a week. Sunday morning    . torsemide (DEMADEX) 20 MG tablet Take 1 tablet (20 mg total) by mouth daily as needed (swelling or weight gain of 2 or more pounds overnight.). 90 tablet 3   No current facility-administered medications for this visit.    REVIEW OF SYSTEMS:   [X]  denotes positive finding, [ ]  denotes negative finding Cardiac   Comments:  Chest pain or chest pressure:    Shortness of breath upon exertion:    Short of breath when lying flat:    Irregular heart rhythm:    Constitutional    Fever or chills:      PHYSICAL EXAM:   Vitals:   06/18/20 0842 06/18/20 0844  BP: 137/79 132/82  Pulse: 73   Resp: 20   Temp: 98.1 F (36.7 C)   SpO2: 95%   Weight: 216 lb (98 kg)   Height: 5\' 10"  (1.778 m)     GENERAL: The patient is a well-nourished male, in no acute distress. The vital signs are documented above. CARDIOVASCULAR: There is a regular rate and rhythm. PULMONARY: Non-labored respirations Incision is well healed Neuro intact  STUDIES:   Carotid duplex: Right Carotid: There is no evidence of stenosis in the right ICA. Widely  patent         stent.   Left Carotid: Velocities in the left ICA are consistent with a 1-39%  stenosis.   Vertebrals: Bilateral vertebral arteries demonstrate antegrade flow.  Subclavians: Normal flow hemodynamics were seen in bilateral subclavian        arteries   MEDICAL ISSUES:   Symptomatic right carotid stenosis: The patient stent is widely patent.  He is neurologically intact.  I will discontinue his Plavix after 1 month and he will remain on Eliquis and aspirin.  He will follow-up in 9 months with repeat carotid duplex and ABIs.  Leia Alf, MD, FACS [facility] [phone] Pager [phone]

## 2020-06-26 ENCOUNTER — Ambulatory Visit
Admission: RE | Admit: 2020-06-26 | Discharge: 2020-06-26 | Disposition: A | Discharge status: Home / Self Care | Payer: PPO | Source: Ambulatory Visit | Attending: Family Medicine | Admitting: Family Medicine

## 2020-06-26 DIAGNOSIS — R946 Abnormal results of thyroid function studies: Secondary | ICD-10-CM

## 2020-06-26 DIAGNOSIS — E041 Nontoxic single thyroid nodule: Secondary | ICD-10-CM | POA: Diagnosis not present

## 2020-07-02 DIAGNOSIS — I639 Cerebral infarction, unspecified: Secondary | ICD-10-CM | POA: Diagnosis not present

## 2020-07-02 DIAGNOSIS — E1165 Type 2 diabetes mellitus with hyperglycemia: Secondary | ICD-10-CM | POA: Diagnosis not present

## 2020-07-02 DIAGNOSIS — I2510 Atherosclerotic heart disease of native coronary artery without angina pectoris: Secondary | ICD-10-CM | POA: Diagnosis not present

## 2020-07-02 DIAGNOSIS — I4891 Unspecified atrial fibrillation: Secondary | ICD-10-CM | POA: Diagnosis not present

## 2020-07-02 DIAGNOSIS — E7800 Pure hypercholesterolemia, unspecified: Secondary | ICD-10-CM | POA: Diagnosis not present

## 2020-07-02 DIAGNOSIS — M8580 Other specified disorders of bone density and structure, unspecified site: Secondary | ICD-10-CM | POA: Diagnosis not present

## 2020-07-02 DIAGNOSIS — I10 Essential (primary) hypertension: Secondary | ICD-10-CM | POA: Diagnosis not present

## 2020-07-02 DIAGNOSIS — E119 Type 2 diabetes mellitus without complications: Secondary | ICD-10-CM | POA: Diagnosis not present

## 2020-07-02 DIAGNOSIS — N184 Chronic kidney disease, stage 4 (severe): Secondary | ICD-10-CM | POA: Diagnosis not present

## 2020-07-02 DIAGNOSIS — K219 Gastro-esophageal reflux disease without esophagitis: Secondary | ICD-10-CM | POA: Diagnosis not present

## 2020-07-02 DIAGNOSIS — E1142 Type 2 diabetes mellitus with diabetic polyneuropathy: Secondary | ICD-10-CM | POA: Diagnosis not present

## 2020-07-10 DIAGNOSIS — E1165 Type 2 diabetes mellitus with hyperglycemia: Secondary | ICD-10-CM | POA: Diagnosis not present

## 2020-07-16 DIAGNOSIS — M5412 Radiculopathy, cervical region: Secondary | ICD-10-CM | POA: Diagnosis not present

## 2020-07-16 DIAGNOSIS — M9902 Segmental and somatic dysfunction of thoracic region: Secondary | ICD-10-CM | POA: Diagnosis not present

## 2020-07-16 DIAGNOSIS — M5033 Other cervical disc degeneration, cervicothoracic region: Secondary | ICD-10-CM | POA: Diagnosis not present

## 2020-07-16 DIAGNOSIS — M9901 Segmental and somatic dysfunction of cervical region: Secondary | ICD-10-CM | POA: Diagnosis not present

## 2020-07-25 DIAGNOSIS — E1122 Type 2 diabetes mellitus with diabetic chronic kidney disease: Secondary | ICD-10-CM | POA: Diagnosis not present

## 2020-07-25 DIAGNOSIS — N179 Acute kidney failure, unspecified: Secondary | ICD-10-CM | POA: Diagnosis not present

## 2020-07-25 DIAGNOSIS — E785 Hyperlipidemia, unspecified: Secondary | ICD-10-CM | POA: Diagnosis not present

## 2020-07-25 DIAGNOSIS — I6522 Occlusion and stenosis of left carotid artery: Secondary | ICD-10-CM | POA: Diagnosis not present

## 2020-07-25 DIAGNOSIS — E871 Hypo-osmolality and hyponatremia: Secondary | ICD-10-CM | POA: Diagnosis not present

## 2020-07-25 DIAGNOSIS — R809 Proteinuria, unspecified: Secondary | ICD-10-CM | POA: Diagnosis not present

## 2020-07-25 DIAGNOSIS — Z9989 Dependence on other enabling machines and devices: Secondary | ICD-10-CM | POA: Diagnosis not present

## 2020-07-25 DIAGNOSIS — I639 Cerebral infarction, unspecified: Secondary | ICD-10-CM | POA: Diagnosis not present

## 2020-07-25 DIAGNOSIS — I129 Hypertensive chronic kidney disease with stage 1 through stage 4 chronic kidney disease, or unspecified chronic kidney disease: Secondary | ICD-10-CM | POA: Diagnosis not present

## 2020-07-25 DIAGNOSIS — R5383 Other fatigue: Secondary | ICD-10-CM | POA: Diagnosis not present

## 2020-07-25 DIAGNOSIS — N183 Chronic kidney disease, stage 3 unspecified: Secondary | ICD-10-CM | POA: Diagnosis not present

## 2020-07-25 DIAGNOSIS — I2510 Atherosclerotic heart disease of native coronary artery without angina pectoris: Secondary | ICD-10-CM | POA: Diagnosis not present

## 2020-07-30 ENCOUNTER — Other Ambulatory Visit: Payer: Self-pay | Admitting: Internal Medicine

## 2020-07-30 DIAGNOSIS — E041 Nontoxic single thyroid nodule: Secondary | ICD-10-CM

## 2020-07-30 DIAGNOSIS — M9901 Segmental and somatic dysfunction of cervical region: Secondary | ICD-10-CM | POA: Diagnosis not present

## 2020-07-30 DIAGNOSIS — M9902 Segmental and somatic dysfunction of thoracic region: Secondary | ICD-10-CM | POA: Diagnosis not present

## 2020-07-30 DIAGNOSIS — I2510 Atherosclerotic heart disease of native coronary artery without angina pectoris: Secondary | ICD-10-CM | POA: Diagnosis not present

## 2020-07-30 DIAGNOSIS — E1142 Type 2 diabetes mellitus with diabetic polyneuropathy: Secondary | ICD-10-CM | POA: Diagnosis not present

## 2020-07-30 DIAGNOSIS — E1165 Type 2 diabetes mellitus with hyperglycemia: Secondary | ICD-10-CM | POA: Diagnosis not present

## 2020-07-30 DIAGNOSIS — I10 Essential (primary) hypertension: Secondary | ICD-10-CM | POA: Diagnosis not present

## 2020-07-30 DIAGNOSIS — Z794 Long term (current) use of insulin: Secondary | ICD-10-CM | POA: Diagnosis not present

## 2020-07-30 DIAGNOSIS — M5033 Other cervical disc degeneration, cervicothoracic region: Secondary | ICD-10-CM | POA: Diagnosis not present

## 2020-07-30 DIAGNOSIS — M5412 Radiculopathy, cervical region: Secondary | ICD-10-CM | POA: Diagnosis not present

## 2020-08-09 DIAGNOSIS — E1165 Type 2 diabetes mellitus with hyperglycemia: Secondary | ICD-10-CM | POA: Diagnosis not present

## 2020-08-11 NOTE — Progress Notes (Signed)
Cardiology Office Note:    Date:  08/15/2020   ID:  Bradley [name], DOB Jan 04, 1946, MRN [id number]  PCP:  [name], MD  Cardiologist:  Sinclair Grooms, MD   Referring MD: [name], MD   Chief Complaint  Patient presents with  . Coronary Artery Disease  . Hypertension  . Hyperlipidemia    History of Present Illness:    Bradley [name] is a 75 y.o. male with a hx of with a hx of CAD s/p CABG [id number] combined systolic and diastolic heart failure,HTN, HLD, OSA, PAD with L CEA x2and aorto-bifem bypass, OSA on CPAP,and DMT2 who was seen most recentlyfor evaluation of chest pain and SOB.Recent admission for near syncope related to multifactorial etiology including meds, diarrhea, & neuropathy. New onset PAF leading to Eliquis and discontinuation of plavix/aggrenox (PAD). Had TCAR 06/01/2020 per Dr. Trula Slade.  Back today for blood pressure follow-up.  Creatinine is slowly increasing.  Followed by Dr. Marval Regal.  He is concerned about the blood pressure elevation.  He has no neurological symptoms, chest pain, or other complaints.  Since increasing beta-blocker therapy he feels somewhat lethargic.  Past Medical History:  Diagnosis Date  . Acquired equinus deformity of foot 12/15/2011  . Anemia   . Arthritis   . Carotid artery occlusion    a. Carotid US 10/16: RICA 60-79%, L CEA patent with 1-39% stenosis  . CKD (chronic kidney disease)   . Coagulation disorder ([place]) 05/25/2019  . Coronary artery disease    a. Myoview 9/16:  EF 52%, inferior fixed defect consistent with diaphragmatic attenuation, intermediate risk secondary to poor exercise tolerance and symptoms during stress;  b. LHC 05/17/2015 90% mid LAD, 80% ost D2, 75% mid RCA, 35% prox RCA. >> S/p CABG  . Coronary artery disease involving coronary bypass graft of native heart with angina pectoris ([place])    CORONARY ARTERY BYPASS GRAFTING x 3 - 05/23/2015 Left internal mammary artery to left anterior  descending  Saphenous vein graft to diagonal  Saphenous vein graft to posterior descending Endoscopic greater saphenous vein harvest right thigh   . Diabetes mellitus age 22  . Diverticulitis   . Diverticulosis of colon without hemorrhage 05/17/2015   By CT scan   . DM type 2, controlled, with complication ([place]) 6/94/8546  . Essential hypertension 04/29/2015  . GERD (gastroesophageal reflux disease)   . H/O hiatal hernia   . History of echocardiogram    a. Echo 9/16: GLS -15.2%, EF 27-03%, grade 1 diastolic dysfunction, normal wall motion, aortic sclerosis, dilated aortic root 39 mm, atrial septal lipomatous hypertrophy  . Hyperlipidemia   . Hypertension   . Irritable bowel syndrome (IBS)   . Myocardial infarction [facility])    silent inferior MI; patient denies MI history (03/17/13)   . Neuropathy 2013  . OSA on CPAP 05/30/2015  . Osteoporosis 2013  . PAF (paroxysmal atrial fibrillation) ([place])    post CABG 2016; Amiodarone stopped 2/2 wheezing; PAF 01/12/20  . Peripheral vascular disease ([place])   . Plantar fasciitis 12/15/2011  . Reflux esophagitis   . Right bundle branch block 04/30/2015  . Sleep apnea    uses CPAP  . Stenosis of right internal carotid artery with cerebral infarction ([place]) 12/09/2011   Right brain CVA 1987   . Stress fracture of metatarsal bone 02/18/2012  . Stroke [facility]) 1987   Right brain stroke  . Syncope 01/12/2020  . Type 2 diabetes mellitus with vascular disease ([place]) 05/30/2015  .  Wears glasses     Past Surgical History:  Procedure Laterality Date  . CARDIAC CATHETERIZATION N/A 05/17/2015   Procedure: Left Heart Cath and Coronary Angiography;  Surgeon: Belva Crome, MD;  Location: [place] [facility];  Service: Cardiovascular;  Laterality: N/A;  . CAROTID ENDARTERECTOMY  1994 & redo 2001   Left  . COLONOSCOPY W/ BIOPSIES AND POLYPECTOMY    . CORONARY ARTERY BYPASS GRAFT N/A 05/23/2015   Procedure: CORONARY ARTERY BYPASS GRAFTING (CABG) x 3 (LIMA to LAD, SVG to  DIAGONAL 2, SVG to PDA) with Endoscopic Vein Harvesting from right greater saphenous vein;  Surgeon: Grace Isaac, MD;  Location: [place];  Service: Open Heart Surgery;  Laterality: N/A;  . FRACTURE SURGERY  2013   Right   foo  t X's 2  . ILIAC ARTERY STENT    . LAPAROSCOPIC CHOLECYSTECTOMY  09/11/2016  . POSTERIOR LUMBAR FUSION  Aug. 14, 2014   Level 1  . PR VEIN BYPASS GRAFT,AORTO-FEM-POP  1998  . [place] SURGERY  2014  . TEE WITHOUT CARDIOVERSION N/A 05/23/2015   Procedure: TRANSESOPHAGEAL ECHOCARDIOGRAM (TEE);  Surgeon: Grace Isaac, MD;  Location: [place];  Service: Open Heart Surgery;  Laterality: N/A;  . TONSILLECTOMY    . TRANSCAROTID ARTERY REVASCULARIZATION Right 06/01/2020   Procedure: RIGHT TRANSCAROTID ARTERY REVASCULARIZATION;  Surgeon: Serafina Mitchell, MD;  Location: [facility] OR;  Service: Vascular;  Laterality: Right;  . TRIGGER FINGER RELEASE Right 07/14/2017   Procedure: RIGHT LONG FINGER TRIGGER RELEASE;  Surgeon: Milly Jakob, MD;  Location: [facility];  Service: Orthopedics;  Laterality: Right;    Current Medications: Current Meds  Medication Sig  . amLODipine (NORVASC) 10 MG tablet Take 1 tablet (10 mg total) by mouth daily.  Marland Kitchen apixaban (ELIQUIS) 5 MG TABS tablet Take 1 tablet (5 mg total) by mouth 2 (two) times daily.  Marland Kitchen aspirin EC 81 MG tablet Take 81 mg by mouth daily. Swallow whole.  Marland Kitchen atorvastatin (LIPITOR) 40 MG tablet Take 40 mg by mouth daily.  . Cholecalciferol (VITAMIN D3) 125 MCG (5000 UT) TABS Take 5,000 Units by mouth daily.   Marland Kitchen dexlansoprazole (DEXILANT) 60 MG capsule Take 60 mg by mouth daily.   Marland Kitchen FIASP FLEXTOUCH 100 UNIT/ML SOPN Inject 20-25 Units into the skin in the morning, at noon, and at bedtime. Sliding scale as per the carbohydrate intake.  . gabapentin (NEURONTIN) 300 MG capsule Take 600 mg by mouth at bedtime.   . hydrALAZINE (APRESOLINE) 25 MG tablet Take 1 tablet (25 mg total) by mouth in the morning and at bedtime.  .  hydrocortisone (ANUSOL-HC) 2.5 % rectal cream Place 1 application rectally daily as needed for hemorrhoids.   . hydrocortisone 2.5 % cream Apply 1 application topically 2 (two) times daily.  Marland Kitchen ketoconazole (NIZORAL) 2 % shampoo Apply 1 application topically daily as needed for irritation.   Marland Kitchen L-Methylfolate-Algae-B12-B6 (FOLTANX RF) 3-90.314-2-35 MG CAPS Take 1 capsule by mouth in the morning and at bedtime.   . methocarbamol (ROBAXIN) 500 MG tablet Take 500 mg by mouth every 6 (six) hours as needed for muscle spasms.   . metoprolol tartrate (LOPRESSOR) 50 MG tablet Take 1.5 tablets (75 mg total) by mouth 2 (two) times daily.  . nitroGLYCERIN (NITROSTAT) 0.3 MG SL tablet Place 0.3 mg under the tongue every 5 (five) minutes as needed for chest pain. Only as needed  . testosterone cypionate (DEPOTESTOSTERONE CYPIONATE) 200 MG/ML injection Inject 200 mg into the muscle once  a week. 0.6 ml  . TRESIBA FLEXTOUCH 100 UNIT/ML SOPN FlexTouch Pen Inject 20-25 Units into the skin daily.   . TRULICITY 5.80 DX/8.3JA SOPN Inject 0.75 mg into the skin once a week. Sunday morning     Allergies:   Hydrocodone, Oxycodone, Penicillins, and Sulfa drugs cross reactors   Social History   Socioeconomic History  . Marital status: Married    Spouse name: Not on file  . Number of children: Not on file  . Years of education: Not on file  . Highest education level: Not on file  Occupational History  . Not on file  Tobacco Use  . Smoking status: Former Smoker    Packs/day: 2.00    Years: 40.00    Pack years: 80.00    Types: Cigarettes    Quit date: 03/26/2009    Years since quitting: 11.3  . Smokeless tobacco: Never Used  Vaping Use  . Vaping Use: Never used  Substance and Sexual Activity  . Alcohol use: No  . Drug use: No  . Sexual activity: Not on file  Other Topics Concern  . Not on file  Social History Narrative  . Not on file   Social Determinants of Health   Financial Resource Strain: Not on  file  Food Insecurity: Not on file  Transportation Needs: Not on file  Physical Activity: Not on file  Stress: Not on file  Social Connections: Not on file     Family History: The patient's family history includes Cancer (age of onset: 40) in his mother; Deep vein thrombosis in his father; Hyperlipidemia in his father and mother; Hypertension in his father; Stroke (age of onset: 22) in his father.  ROS:   Please see the history of present illness.    Lethargy.  He has sleep apnea and uses CPAP nightly.  This is followed by Dr. Nehemiah Settle.  All other systems reviewed and are negative.  EKGs/Labs/Other Studies Reviewed:    The following studies were reviewed today: No new data  EKG:  EKG not repeated  Recent Labs: 08/16/2019: NT-Pro BNP 143 06/01/2020: ALT 19 06/02/2020: BUN 26; Creatinine, Ser 2.41; Hemoglobin 13.2; Platelets 186; Potassium 3.6; Sodium 138  Recent Lipid Panel    Component Value Date/Time   CHOL 82 06/01/2015 0315   TRIG 158 (H) 06/01/2015 0315   HDL 24 (L) 06/01/2015 0315   CHOLHDL 3.4 06/01/2015 0315   VLDL 32 06/01/2015 0315   LDLCALC 26 06/01/2015 0315    Physical Exam:    VS:  BP (!) 148/75   Pulse 76   Ht 5' 11.5" (1.816 m)   Wt 225 lb (102.1 kg)   SpO2 96%   BMI 30.94 kg/m     Wt Readings from Last 3 Encounters:  08/15/20 225 lb (102.1 kg)  06/18/20 216 lb (98 kg)  06/07/20 219 lb (99.3 kg)     GEN: Healthy-appearing.  Good skin:.. No acute distress HEENT: Normal NECK: No JVD. LYMPHATICS: No lymphadenopathy CARDIAC: No murmur. RRR no gallop, or edema. VASCULAR:  Normal Pulses. No bruits. RESPIRATORY:  Clear to auscultation without rales, wheezing or rhonchi  ABDOMEN: Soft, non-tender, non-distended, No pulsatile mass, MUSCULOSKELETAL: No deformity  SKIN: Warm and dry NEUROLOGIC:  Alert and oriented x 3 PSYCHIATRIC:  Normal affect   ASSESSMENT:    1. Coronary artery disease involving coronary bypass graft of native heart with  angina pectoris ([place])   2. PAF (paroxysmal atrial fibrillation) ([place])   3. Essential hypertension  4. Right bundle branch block   5. Bilateral extracranial carotid artery stenosis   6. Hyperlipidemia, unspecified hyperlipidemia type   7. OSA on CPAP   8. Type 2 diabetes mellitus with vascular disease ([place])   9. Educated about COVID-19 virus infection    PLAN:    In order of problems listed above:  1. Secondary prevention was briefly discussed. 2. No recurrence of A. Fib 3. BP is still higher than we would like.  Trying to get systolic close to 938.  Add hydralazine 25 mg twice daily.  Titrate up as an outpatient based upon home recordings which seem to be accurate.   Plan clinical follow-up in 4 months.  Up titration of hydralazine as needed based upon home recordings.   Medication Adjustments/Labs and Tests Ordered: Current medicines are reviewed at length with the patient today.  Concerns regarding medicines are outlined above.  No orders of the defined types were placed in this encounter.  Meds ordered this encounter  Medications  . hydrALAZINE (APRESOLINE) 25 MG tablet    Sig: Take 1 tablet (25 mg total) by mouth in the morning and at bedtime.    Dispense:  180 tablet    Refill:  3    There are no Patient Instructions on file for this visit.   Signed, Sinclair Grooms, MD  08/15/2020 2:05 PM    [place]

## 2020-08-13 DIAGNOSIS — M5033 Other cervical disc degeneration, cervicothoracic region: Secondary | ICD-10-CM | POA: Diagnosis not present

## 2020-08-13 DIAGNOSIS — M5412 Radiculopathy, cervical region: Secondary | ICD-10-CM | POA: Diagnosis not present

## 2020-08-13 DIAGNOSIS — M9901 Segmental and somatic dysfunction of cervical region: Secondary | ICD-10-CM | POA: Diagnosis not present

## 2020-08-13 DIAGNOSIS — M9902 Segmental and somatic dysfunction of thoracic region: Secondary | ICD-10-CM | POA: Diagnosis not present

## 2020-08-15 ENCOUNTER — Other Ambulatory Visit: Payer: Self-pay

## 2020-08-15 ENCOUNTER — Encounter: Payer: Self-pay | Admitting: Interventional Cardiology

## 2020-08-15 ENCOUNTER — Ambulatory Visit: Payer: PPO | Admitting: Interventional Cardiology

## 2020-08-15 VITALS — BP 148/75 | HR 76 | Ht 71.5 in | Wt 225.0 lb

## 2020-08-15 DIAGNOSIS — Z7189 Other specified counseling: Secondary | ICD-10-CM

## 2020-08-15 DIAGNOSIS — E785 Hyperlipidemia, unspecified: Secondary | ICD-10-CM | POA: Diagnosis not present

## 2020-08-15 DIAGNOSIS — I25709 Atherosclerosis of coronary artery bypass graft(s), unspecified, with unspecified angina pectoris: Secondary | ICD-10-CM | POA: Diagnosis not present

## 2020-08-15 DIAGNOSIS — E1159 Type 2 diabetes mellitus with other circulatory complications: Secondary | ICD-10-CM | POA: Diagnosis not present

## 2020-08-15 DIAGNOSIS — I4510 Unspecified right bundle-branch block: Secondary | ICD-10-CM | POA: Diagnosis not present

## 2020-08-15 DIAGNOSIS — I480 Paroxysmal atrial fibrillation: Secondary | ICD-10-CM | POA: Diagnosis not present

## 2020-08-15 DIAGNOSIS — Z9989 Dependence on other enabling machines and devices: Secondary | ICD-10-CM

## 2020-08-15 DIAGNOSIS — I10 Essential (primary) hypertension: Secondary | ICD-10-CM | POA: Diagnosis not present

## 2020-08-15 DIAGNOSIS — I6523 Occlusion and stenosis of bilateral carotid arteries: Secondary | ICD-10-CM

## 2020-08-15 DIAGNOSIS — G4733 Obstructive sleep apnea (adult) (pediatric): Secondary | ICD-10-CM

## 2020-08-15 MED ORDER — HYDRALAZINE HCL 25 MG PO TABS: 25.0000 mg | ORAL_TABLET | Freq: Two times a day (BID) | ORAL | 3 refills | Status: DC

## 2020-08-15 NOTE — Patient Instructions (Signed)
Medication Instructions:  1) START Hydralazine 25mg  twice daily.  Monitor your blood pressure in your right arm at least twice a week for the next two weeks and either call or send a MyChart message with those readings.    *If you need a refill on your cardiac medications before your next appointment, please call your pharmacy*   Lab Work: None If you have labs (blood work) drawn today and your tests are completely normal, you will receive your results only by: Marland Kitchen MyChart Message (if you have MyChart) OR . A paper copy in the mail If you have any lab test that is abnormal or we need to change your treatment, we will call you to review the results.   Testing/Procedures: None   Follow-Up: At [facility], you and your health needs are our priority.  As part of our continuing mission to provide you with exceptional heart care, we have created designated Provider Care Teams.  These Care Teams include your primary Cardiologist (physician) and Advanced Practice Providers (APPs -  Physician Assistants and Nurse Practitioners) who all work together to provide you with the care you need, when you need it.  We recommend signing up for the patient portal called "MyChart".  Sign up information is provided on this After Visit Summary.  MyChart is used to connect with patients for Virtual Visits (Telemedicine).  Patients are able to view lab/test results, encounter notes, upcoming appointments, etc.  Non-urgent messages can be sent to your provider as well.   To learn more about what you can do with MyChart, go to [email or web address].    Your next appointment:   4 month(s)  The format for your next appointment:   In Person  Provider:   You may see Sinclair Grooms, MD or one of the following Advanced Practice Providers on your designated Care Team:    Truitt Merle, NP  Cecilie Kicks, NP  Kathyrn Drown, NP    Other Instructions

## 2020-08-17 DIAGNOSIS — C44329 Squamous cell carcinoma of skin of other parts of face: Secondary | ICD-10-CM | POA: Diagnosis not present

## 2020-08-17 DIAGNOSIS — D044 Carcinoma in situ of skin of scalp and neck: Secondary | ICD-10-CM | POA: Diagnosis not present

## 2020-08-22 DIAGNOSIS — I2510 Atherosclerotic heart disease of native coronary artery without angina pectoris: Secondary | ICD-10-CM | POA: Diagnosis not present

## 2020-08-22 DIAGNOSIS — N184 Chronic kidney disease, stage 4 (severe): Secondary | ICD-10-CM | POA: Diagnosis not present

## 2020-08-22 DIAGNOSIS — M8580 Other specified disorders of bone density and structure, unspecified site: Secondary | ICD-10-CM | POA: Diagnosis not present

## 2020-08-22 DIAGNOSIS — K219 Gastro-esophageal reflux disease without esophagitis: Secondary | ICD-10-CM | POA: Diagnosis not present

## 2020-08-22 DIAGNOSIS — I4891 Unspecified atrial fibrillation: Secondary | ICD-10-CM | POA: Diagnosis not present

## 2020-08-22 DIAGNOSIS — E1142 Type 2 diabetes mellitus with diabetic polyneuropathy: Secondary | ICD-10-CM | POA: Diagnosis not present

## 2020-08-22 DIAGNOSIS — E7800 Pure hypercholesterolemia, unspecified: Secondary | ICD-10-CM | POA: Diagnosis not present

## 2020-08-22 DIAGNOSIS — I10 Essential (primary) hypertension: Secondary | ICD-10-CM | POA: Diagnosis not present

## 2020-08-22 DIAGNOSIS — I639 Cerebral infarction, unspecified: Secondary | ICD-10-CM | POA: Diagnosis not present

## 2020-08-22 DIAGNOSIS — E119 Type 2 diabetes mellitus without complications: Secondary | ICD-10-CM | POA: Diagnosis not present

## 2020-08-22 DIAGNOSIS — E1165 Type 2 diabetes mellitus with hyperglycemia: Secondary | ICD-10-CM | POA: Diagnosis not present

## 2020-09-04 DIAGNOSIS — C44311 Basal cell carcinoma of skin of nose: Secondary | ICD-10-CM | POA: Diagnosis not present

## 2020-09-10 DIAGNOSIS — E1165 Type 2 diabetes mellitus with hyperglycemia: Secondary | ICD-10-CM | POA: Diagnosis not present

## 2020-09-24 DIAGNOSIS — M5033 Other cervical disc degeneration, cervicothoracic region: Secondary | ICD-10-CM | POA: Diagnosis not present

## 2020-09-24 DIAGNOSIS — M9901 Segmental and somatic dysfunction of cervical region: Secondary | ICD-10-CM | POA: Diagnosis not present

## 2020-09-24 DIAGNOSIS — M5412 Radiculopathy, cervical region: Secondary | ICD-10-CM | POA: Diagnosis not present

## 2020-09-24 DIAGNOSIS — M9902 Segmental and somatic dysfunction of thoracic region: Secondary | ICD-10-CM | POA: Diagnosis not present

## 2020-09-26 DIAGNOSIS — H02889 Meibomian gland dysfunction of unspecified eye, unspecified eyelid: Secondary | ICD-10-CM | POA: Diagnosis not present

## 2020-09-26 DIAGNOSIS — H40013 Open angle with borderline findings, low risk, bilateral: Secondary | ICD-10-CM | POA: Diagnosis not present

## 2020-09-26 DIAGNOSIS — H1045 Other chronic allergic conjunctivitis: Secondary | ICD-10-CM | POA: Diagnosis not present

## 2020-10-08 DIAGNOSIS — M5412 Radiculopathy, cervical region: Secondary | ICD-10-CM | POA: Diagnosis not present

## 2020-10-08 DIAGNOSIS — M9901 Segmental and somatic dysfunction of cervical region: Secondary | ICD-10-CM | POA: Diagnosis not present

## 2020-10-08 DIAGNOSIS — M9902 Segmental and somatic dysfunction of thoracic region: Secondary | ICD-10-CM | POA: Diagnosis not present

## 2020-10-08 DIAGNOSIS — M5033 Other cervical disc degeneration, cervicothoracic region: Secondary | ICD-10-CM | POA: Diagnosis not present

## 2020-10-10 DIAGNOSIS — E1165 Type 2 diabetes mellitus with hyperglycemia: Secondary | ICD-10-CM | POA: Diagnosis not present

## 2020-10-16 ENCOUNTER — Other Ambulatory Visit: Payer: Self-pay

## 2020-10-16 DIAGNOSIS — I779 Disorder of arteries and arterioles, unspecified: Secondary | ICD-10-CM

## 2020-10-18 DIAGNOSIS — J019 Acute sinusitis, unspecified: Secondary | ICD-10-CM | POA: Diagnosis not present

## 2020-10-18 DIAGNOSIS — G479 Sleep disorder, unspecified: Secondary | ICD-10-CM | POA: Diagnosis not present

## 2020-10-18 DIAGNOSIS — G629 Polyneuropathy, unspecified: Secondary | ICD-10-CM | POA: Diagnosis not present

## 2020-10-18 DIAGNOSIS — I779 Disorder of arteries and arterioles, unspecified: Secondary | ICD-10-CM | POA: Diagnosis not present

## 2020-10-18 DIAGNOSIS — K649 Unspecified hemorrhoids: Secondary | ICD-10-CM | POA: Diagnosis not present

## 2020-10-23 ENCOUNTER — Ambulatory Visit (HOSPITAL_COMMUNITY)
Admission: RE | Admit: 2020-10-23 | Discharge: 2020-10-23 | Disposition: A | Discharge status: Home / Self Care | Payer: PPO | Source: Ambulatory Visit | Attending: Surgery | Admitting: Surgery

## 2020-10-23 ENCOUNTER — Ambulatory Visit: Payer: PPO | Admitting: Physician Assistant

## 2020-10-23 ENCOUNTER — Encounter: Payer: Self-pay | Admitting: Physician Assistant

## 2020-10-23 ENCOUNTER — Other Ambulatory Visit: Payer: Self-pay

## 2020-10-23 VITALS — BP 139/78 | HR 73 | Temp 98.6°F | Resp 20 | Ht 71.5 in | Wt 224.2 lb

## 2020-10-23 DIAGNOSIS — I6523 Occlusion and stenosis of bilateral carotid arteries: Secondary | ICD-10-CM | POA: Diagnosis not present

## 2020-10-23 DIAGNOSIS — I779 Disorder of arteries and arterioles, unspecified: Secondary | ICD-10-CM

## 2020-10-23 DIAGNOSIS — I70213 Atherosclerosis of native arteries of extremities with intermittent claudication, bilateral legs: Secondary | ICD-10-CM

## 2020-10-23 DIAGNOSIS — Z95828 Presence of other vascular implants and grafts: Secondary | ICD-10-CM

## 2020-10-23 NOTE — Progress Notes (Signed)
Office Note     CC:  follow up Requesting Provider:  [name], MD  HPI: Bradley [name] is a 75 y.o. (09-21-1945) male who presents for follow of peripheral artery disease. He has remote history of aortobifemoral bypass graft in 1998 by Dr. Donnetta Hutching. Overall he has not had much as far as lower extremity symptoms over the years. He presents today with complaints of about 5-6 weeks of worsening lower extremity cramping on ambulation. He says it came on all the sudden. He usually walks 15 minutes on the treadmill and bikes 15 minutes a day and one day all the sudden he could only do like 3 minutes. He says he has continued to try to exercise but he can only tolerate 2-3 minutes on the treadmill and he is able to do the bike for 15 minutes. He says he additionally can only walk about 10 -15 yards before his calves cramp severely, right greater than left. It will improve with rest but takes several minutes. If he keeps pushing it he will eventually have burning sensation in his thighs and hips. He denies any pain at rest or any tissue loss. He says now his right calf stays sore to the touch from having such severe cramps. He does have neuropathy in both feet so persistent numbness is present. He denies any abdominal pain or back pain. He does have arthritis in his back but he says the pain he has from that is unchanged. He has been compliant with is Aspirin, statin, and Eliquis  He is s/p right TCAR  In October of 2021 by Dr Trula Slade for symptomatic carotid stenosis. He has been doing well post operatively. He also has additional remote history of left carotid endarterectomy in 1994 with a redo in 2001. He denies any visual changes, facial drooping, speech difficulties or unilateral upper or lower extremity weakness or numbness. He does state that since his TCAR he has been able to hear his heart beating in his ears usually when he lays down at night. He explains that sometimes it is a "shwooshing" sound.    The pt is on a statin for cholesterol management.  The pt is on a daily aspirin.   Other AC: Eliquis The pt is on CCB and BB for hypertension.   The pt is diabetic.  Tobacco hx: Former smoker, quit 2010  Past Medical History:  Diagnosis Date  . Acquired equinus deformity of foot 12/15/2011  . Anemia   . Arthritis   . Carotid artery occlusion    a. Carotid US 10/16: RICA 60-79%, L CEA patent with 1-39% stenosis  . CKD (chronic kidney disease)   . Coagulation disorder ([place]) 05/25/2019  . Coronary artery disease    a. Myoview 9/16:  EF 52%, inferior fixed defect consistent with diaphragmatic attenuation, intermediate risk secondary to poor exercise tolerance and symptoms during stress;  b. LHC 05/17/2015 90% mid LAD, 80% ost D2, 75% mid RCA, 35% prox RCA. >> S/p CABG  . Coronary artery disease involving coronary bypass graft of native heart with angina pectoris ([place])    CORONARY ARTERY BYPASS GRAFTING x 3 - 05/23/2015 Left internal mammary artery to left anterior descending  Saphenous vein graft to diagonal  Saphenous vein graft to posterior descending Endoscopic greater saphenous vein harvest right thigh   . Diabetes mellitus age 29  . Diverticulitis   . Diverticulosis of colon without hemorrhage 05/17/2015   By CT scan   . DM type 2, controlled, with  complication ([place]) 2/37/6283  . Essential hypertension 04/29/2015  . GERD (gastroesophageal reflux disease)   . H/O hiatal hernia   . History of echocardiogram    a. Echo 9/16: GLS -15.2%, EF 15-17%, grade 1 diastolic dysfunction, normal wall motion, aortic sclerosis, dilated aortic root 39 mm, atrial septal lipomatous hypertrophy  . Hyperlipidemia   . Hypertension   . Irritable bowel syndrome (IBS)   . Myocardial infarction [facility])    silent inferior MI; patient denies MI history (03/17/13)   . Neuropathy 2013  . OSA on CPAP 05/30/2015  . Osteoporosis 2013  . PAF (paroxysmal atrial fibrillation) ([place])    post CABG 2016; Amiodarone  stopped 2/2 wheezing; PAF 01/12/20  . Peripheral vascular disease ([place])   . Plantar fasciitis 12/15/2011  . Reflux esophagitis   . Right bundle branch block 04/30/2015  . Sleep apnea    uses CPAP  . Stenosis of right internal carotid artery with cerebral infarction ([place]) 12/09/2011   Right brain CVA 1987   . Stress fracture of metatarsal bone 02/18/2012  . Stroke [facility]) 1987   Right brain stroke  . Syncope 01/12/2020  . Type 2 diabetes mellitus with vascular disease ([place]) 05/30/2015  . Wears glasses     Past Surgical History:  Procedure Laterality Date  . CARDIAC CATHETERIZATION N/A 05/17/2015   Procedure: Left Heart Cath and Coronary Angiography;  Surgeon: Belva Crome, MD;  Location: [place] [facility];  Service: Cardiovascular;  Laterality: N/A;  . CAROTID ENDARTERECTOMY  1994 & redo 2001   Left  . COLONOSCOPY W/ BIOPSIES AND POLYPECTOMY    . CORONARY ARTERY BYPASS GRAFT N/A 05/23/2015   Procedure: CORONARY ARTERY BYPASS GRAFTING (CABG) x 3 (LIMA to LAD, SVG to DIAGONAL 2, SVG to PDA) with Endoscopic Vein Harvesting from right greater saphenous vein;  Surgeon: Grace Isaac, MD;  Location: [place];  Service: Open Heart Surgery;  Laterality: N/A;  . FRACTURE SURGERY  2013   Right   foo  t X's 2  . ILIAC ARTERY STENT    . LAPAROSCOPIC CHOLECYSTECTOMY  09/11/2016  . POSTERIOR LUMBAR FUSION  Aug. 14, 2014   Level 1  . PR VEIN BYPASS GRAFT,AORTO-FEM-POP  1998  . [place] SURGERY  2014  . TEE WITHOUT CARDIOVERSION N/A 05/23/2015   Procedure: TRANSESOPHAGEAL ECHOCARDIOGRAM (TEE);  Surgeon: Grace Isaac, MD;  Location: [place];  Service: Open Heart Surgery;  Laterality: N/A;  . TONSILLECTOMY    . TRANSCAROTID ARTERY REVASCULARIZATION Right 06/01/2020   Procedure: RIGHT TRANSCAROTID ARTERY REVASCULARIZATION;  Surgeon: Serafina Mitchell, MD;  Location[facility];  Service: Vascular;  Laterality: Right;  . TRIGGER FINGER RELEASE Right 07/14/2017   Procedure: RIGHT LONG FINGER TRIGGER RELEASE;   Surgeon: Milly Jakob, MD;  Location: [place];  Service: Orthopedics;  Laterality: Right;    Social History   Socioeconomic History  . Marital status: Married    Spouse name: Not on file  . Number of children: Not on file  . Years of education: Not on file  . Highest education level: Not on file  Occupational History  . Not on file  Tobacco Use  . Smoking status: Former Smoker    Packs/day: 2.00    Years: 40.00    Pack years: 80.00    Types: Cigarettes    Quit date: 03/26/2009    Years since quitting: 11.5  . Smokeless tobacco: Never Used  Vaping Use  . Vaping Use: Never used  Substance and  Sexual Activity  . Alcohol use: No  . Drug use: No  . Sexual activity: Not on file  Other Topics Concern  . Not on file  Social History Narrative  . Not on file   Social Determinants of Health   Financial Resource Strain: Not on file  Food Insecurity: Not on file  Transportation Needs: Not on file  Physical Activity: Not on file  Stress: Not on file  Social Connections: Not on file  Intimate Partner Violence: Not on file    Family History  Problem Relation Age of Onset  . Cancer Mother 29       pancreatic  . Hyperlipidemia Mother   . Stroke Father 3  . Deep vein thrombosis Father   . Hyperlipidemia Father   . Hypertension Father     Current Outpatient Medications  Medication Sig Dispense Refill  . amLODipine (NORVASC) 10 MG tablet Take 1 tablet (10 mg total) by mouth daily. 90 tablet 3  . apixaban (ELIQUIS) 5 MG TABS tablet Take 1 tablet (5 mg total) by mouth 2 (two) times daily. 180 tablet 3  . aspirin EC 81 MG tablet Take 81 mg by mouth daily. Swallow whole.    Marland Kitchen atorvastatin (LIPITOR) 40 MG tablet Take 40 mg by mouth daily.    . cefdinir (OMNICEF) 300 MG capsule 1 capsule    . Cholecalciferol (VITAMIN D3) 125 MCG (5000 UT) TABS Take 5,000 Units by mouth daily.     Marland Kitchen dexlansoprazole (DEXILANT) 60 MG capsule Take 60 mg by mouth daily.     Marland Kitchen  FIASP FLEXTOUCH 100 UNIT/ML SOPN Inject 20-25 Units into the skin in the morning, at noon, and at bedtime. Sliding scale as per the carbohydrate intake.    . gabapentin (NEURONTIN) 300 MG capsule Take 600 mg by mouth at bedtime.     . hydrALAZINE (APRESOLINE) 25 MG tablet Take 1 tablet (25 mg total) by mouth in the morning and at bedtime. 180 tablet 3  . hydrocortisone (ANUSOL-HC) 2.5 % rectal cream Place 1 application rectally daily as needed for hemorrhoids.     . hydrocortisone 2.5 % cream Apply 1 application topically 2 (two) times daily.    Marland Kitchen ketoconazole (NIZORAL) 2 % shampoo Apply 1 application topically daily as needed for irritation.     Marland Kitchen L-Methylfolate-Algae-B12-B6 (FOLTANX RF) 3-90.314-2-35 MG CAPS Take 1 capsule by mouth in the morning and at bedtime.     . Melatonin 2.5 MG CHEW See admin instructions.    . methocarbamol (ROBAXIN) 500 MG tablet Take 500 mg by mouth every 6 (six) hours as needed for muscle spasms.     . Metoprolol Tartrate 75 MG TABS 1 tablet    . nitroGLYCERIN (NITROSTAT) 0.3 MG SL tablet Place 0.3 mg under the tongue every 5 (five) minutes as needed for chest pain. Only as needed    . testosterone cypionate (DEPOTESTOSTERONE CYPIONATE) 200 MG/ML injection Inject 200 mg into the muscle once a week. 0.6 ml    . TRESIBA FLEXTOUCH 100 UNIT/ML SOPN FlexTouch Pen Inject 20-25 Units into the skin daily.     . TRULICITY 1.09 NA/3.5TD SOPN Inject 0.75 mg into the skin once a week. Sunday morning    . torsemide (DEMADEX) 20 MG tablet Take 1 tablet (20 mg total) by mouth daily as needed (swelling or weight gain of 2 or more pounds overnight.). 90 tablet 3   No current facility-administered medications for this visit.    Allergies  Allergen Reactions  .  Hydrocodone Nausea Only  . Oxycodone Nausea Only  . Penicillins Rash    Has patient had a PCN reaction causing immediate rash, facial/tongue/throat swelling, SOB or lightheadedness with hypotension: NO Has patient had a PCN  reaction causing severe rash involving mucus membranes or skin necrosis:NO Has patient had a PCN reaction that required hospitalization NO Has patient had a PCN reaction occurring within the last 10 years: NO If all of the above answers are "NO", then may proceed with Cephalosporin use.   . Sulfa Drugs Cross Reactors Rash     REVIEW OF SYSTEMS:  [X]  denotes positive finding, [ ]  denotes negative finding Cardiac  Comments:  Chest pain or chest pressure:    Shortness of breath upon exertion:    Short of breath when lying flat:    Irregular heart rhythm:        Vascular    Pain in calf, thigh, or hip brought on by ambulation:    Pain in feet at night that wakes you up from your sleep:     Blood clot in your veins:    Leg swelling:         Pulmonary    Oxygen at home:    Productive cough:     Wheezing:         Neurologic    Sudden weakness in arms or legs:     Sudden numbness in arms or legs:     Sudden onset of difficulty speaking or slurred speech:    Temporary loss of vision in one eye:     Problems with dizziness:         Gastrointestinal    Blood in stool:     Vomited blood:         Genitourinary    Burning when urinating:     Blood in urine:        Psychiatric    Major depression:         Hematologic    Bleeding problems:    Problems with blood clotting too easily:        Skin    Rashes or ulcers:        Constitutional    Fever or chills:      PHYSICAL EXAMINATION:  Vitals:   10/23/20 1142  BP: 139/78  Pulse: 73  Resp: 20  Temp: 98.6 F (37 C)  TempSrc: Temporal  SpO2: 95%  Weight: 224 lb 3.2 oz (101.7 kg)  Height: 5' 11.5" (1.816 m)    General:  WDWN in NAD; vital signs documented above Gait: Normal HENT: WNL, normocephalic Pulmonary: normal non-labored breathing , without  wheezing Cardiac: regular HR, without  Murmurs with right subclavian and carotid bruit, no left bruit Abdomen: obese, distended, soft, NT, no masses. Normal bowel  sounds Vascular Exam/Pulses:  Right Left  Radial 2+ (normal) 2+ (normal)  Femoral 2+ (normal) 2+ (normal)  Popliteal Not palpable Not palpable  DP Not palpable Not palpable  PT Not palpable Not palpable   Extremities: without ischemic changes, without Gangrene , without cellulitis; without open wounds;  Musculoskeletal: no muscle wasting or atrophy  Neurologic: A&O X 3;  No focal weakness or paresthesias are detected Psychiatric:  The pt has Normal affect.   Non-Invasive Vascular Imaging:   +-------+-----------+-----------+------------+------------+  ABI/TBIToday's ABIToday's TBIPrevious ABIPrevious TBI  +-------+-----------+-----------+------------+------------+  Right 0.38    0     0.64    0.59      +-------+-----------+-----------+------------+------------+  Left  0.68  0.68    1.01    0.59      +-------+-----------+-----------+------------+------------+    ASSESSMENT/PLAN:: 75 y.o. male here for follow up for peripheral artery disease.  He has remote history of aortobifemoral bypass graft in 1998 by Dr. Donnetta Hutching. He recently developed lifestyle limiting claudication, right greater than left lower extremity over the past 5-6 weeks. He has no rest pain or tissue loss. His ABI/ TBI has decreased significantly bilaterally from prior study, right greater than left lower extremity.  - I will have him return for aortoiliac duplex and BLE arterial duplex and ABI and to see Dr. Trula Slade at next earliest available appointment - he will continue his aspirin, statin and Eliquis - Neurologically he is doing well following TCAR - he will have his 9 month post TCAR duplex in August and is scheduled to see Dr. Trula Slade at that time   Karoline Caldwell, PA-C [facility] [phone]  Clinic MD: Dr. Stanford Breed Dr. Carlis Abbott

## 2020-10-25 ENCOUNTER — Other Ambulatory Visit: Payer: Self-pay

## 2020-10-25 DIAGNOSIS — Z95828 Presence of other vascular implants and grafts: Secondary | ICD-10-CM

## 2020-10-29 DIAGNOSIS — E1165 Type 2 diabetes mellitus with hyperglycemia: Secondary | ICD-10-CM | POA: Diagnosis not present

## 2020-10-29 DIAGNOSIS — N179 Acute kidney failure, unspecified: Secondary | ICD-10-CM | POA: Diagnosis not present

## 2020-10-29 DIAGNOSIS — K219 Gastro-esophageal reflux disease without esophagitis: Secondary | ICD-10-CM | POA: Diagnosis not present

## 2020-10-29 DIAGNOSIS — N183 Chronic kidney disease, stage 3 unspecified: Secondary | ICD-10-CM | POA: Diagnosis not present

## 2020-10-29 DIAGNOSIS — E871 Hypo-osmolality and hyponatremia: Secondary | ICD-10-CM | POA: Diagnosis not present

## 2020-10-29 DIAGNOSIS — R809 Proteinuria, unspecified: Secondary | ICD-10-CM | POA: Diagnosis not present

## 2020-10-29 DIAGNOSIS — E1122 Type 2 diabetes mellitus with diabetic chronic kidney disease: Secondary | ICD-10-CM | POA: Diagnosis not present

## 2020-10-29 DIAGNOSIS — I639 Cerebral infarction, unspecified: Secondary | ICD-10-CM | POA: Diagnosis not present

## 2020-10-29 DIAGNOSIS — I129 Hypertensive chronic kidney disease with stage 1 through stage 4 chronic kidney disease, or unspecified chronic kidney disease: Secondary | ICD-10-CM | POA: Diagnosis not present

## 2020-10-29 DIAGNOSIS — I10 Essential (primary) hypertension: Secondary | ICD-10-CM | POA: Diagnosis not present

## 2020-10-29 DIAGNOSIS — M8580 Other specified disorders of bone density and structure, unspecified site: Secondary | ICD-10-CM | POA: Diagnosis not present

## 2020-10-29 DIAGNOSIS — I6522 Occlusion and stenosis of left carotid artery: Secondary | ICD-10-CM | POA: Diagnosis not present

## 2020-10-29 DIAGNOSIS — Z9989 Dependence on other enabling machines and devices: Secondary | ICD-10-CM | POA: Diagnosis not present

## 2020-10-29 DIAGNOSIS — I4891 Unspecified atrial fibrillation: Secondary | ICD-10-CM | POA: Diagnosis not present

## 2020-10-29 DIAGNOSIS — E1142 Type 2 diabetes mellitus with diabetic polyneuropathy: Secondary | ICD-10-CM | POA: Diagnosis not present

## 2020-10-29 DIAGNOSIS — E7800 Pure hypercholesterolemia, unspecified: Secondary | ICD-10-CM | POA: Diagnosis not present

## 2020-10-29 DIAGNOSIS — I2510 Atherosclerotic heart disease of native coronary artery without angina pectoris: Secondary | ICD-10-CM | POA: Diagnosis not present

## 2020-10-29 DIAGNOSIS — E119 Type 2 diabetes mellitus without complications: Secondary | ICD-10-CM | POA: Diagnosis not present

## 2020-10-29 DIAGNOSIS — N184 Chronic kidney disease, stage 4 (severe): Secondary | ICD-10-CM | POA: Diagnosis not present

## 2020-10-29 DIAGNOSIS — E785 Hyperlipidemia, unspecified: Secondary | ICD-10-CM | POA: Diagnosis not present

## 2020-10-30 DIAGNOSIS — E1142 Type 2 diabetes mellitus with diabetic polyneuropathy: Secondary | ICD-10-CM | POA: Diagnosis not present

## 2020-10-30 DIAGNOSIS — Z794 Long term (current) use of insulin: Secondary | ICD-10-CM | POA: Diagnosis not present

## 2020-10-30 DIAGNOSIS — I2510 Atherosclerotic heart disease of native coronary artery without angina pectoris: Secondary | ICD-10-CM | POA: Diagnosis not present

## 2020-10-30 DIAGNOSIS — I10 Essential (primary) hypertension: Secondary | ICD-10-CM | POA: Diagnosis not present

## 2020-10-30 DIAGNOSIS — E1165 Type 2 diabetes mellitus with hyperglycemia: Secondary | ICD-10-CM | POA: Diagnosis not present

## 2020-10-30 DIAGNOSIS — E041 Nontoxic single thyroid nodule: Secondary | ICD-10-CM | POA: Diagnosis not present

## 2020-11-06 DIAGNOSIS — M5033 Other cervical disc degeneration, cervicothoracic region: Secondary | ICD-10-CM | POA: Diagnosis not present

## 2020-11-06 DIAGNOSIS — M9902 Segmental and somatic dysfunction of thoracic region: Secondary | ICD-10-CM | POA: Diagnosis not present

## 2020-11-06 DIAGNOSIS — M9901 Segmental and somatic dysfunction of cervical region: Secondary | ICD-10-CM | POA: Diagnosis not present

## 2020-11-06 DIAGNOSIS — M5412 Radiculopathy, cervical region: Secondary | ICD-10-CM | POA: Diagnosis not present

## 2020-11-08 ENCOUNTER — Ambulatory Visit (INDEPENDENT_AMBULATORY_CARE_PROVIDER_SITE_OTHER)
Admission: RE | Admit: 2020-11-08 | Discharge: 2020-11-08 | Disposition: A | Discharge status: Home / Self Care | Payer: PPO | Source: Ambulatory Visit | Attending: Physician Assistant | Admitting: Physician Assistant

## 2020-11-08 ENCOUNTER — Ambulatory Visit (HOSPITAL_COMMUNITY)
Admission: RE | Admit: 2020-11-08 | Discharge: 2020-11-08 | Disposition: A | Discharge status: Home / Self Care | Payer: PPO | Source: Ambulatory Visit | Attending: Surgery | Admitting: Surgery

## 2020-11-08 ENCOUNTER — Ambulatory Visit (INDEPENDENT_AMBULATORY_CARE_PROVIDER_SITE_OTHER)
Admission: RE | Admit: 2020-11-08 | Discharge: 2020-11-08 | Disposition: A | Discharge status: Home / Self Care | Payer: PPO | Source: Ambulatory Visit | Attending: Vascular Surgery | Admitting: Vascular Surgery

## 2020-11-08 ENCOUNTER — Other Ambulatory Visit: Payer: Self-pay

## 2020-11-08 DIAGNOSIS — I779 Disorder of arteries and arterioles, unspecified: Secondary | ICD-10-CM

## 2020-11-08 DIAGNOSIS — Z95828 Presence of other vascular implants and grafts: Secondary | ICD-10-CM

## 2020-11-09 DIAGNOSIS — E1165 Type 2 diabetes mellitus with hyperglycemia: Secondary | ICD-10-CM | POA: Diagnosis not present

## 2020-11-12 ENCOUNTER — Ambulatory Visit: Payer: PPO | Admitting: Surgery

## 2020-11-12 ENCOUNTER — Other Ambulatory Visit: Payer: Self-pay

## 2020-11-12 ENCOUNTER — Encounter: Payer: Self-pay | Admitting: Surgery

## 2020-11-12 VITALS — BP 160/75 | HR 73 | Temp 98.5°F | Resp 20 | Ht 71.5 in | Wt 228.0 lb

## 2020-11-12 DIAGNOSIS — I70213 Atherosclerosis of native arteries of extremities with intermittent claudication, bilateral legs: Secondary | ICD-10-CM

## 2020-11-12 NOTE — H&P (View-Only) (Signed)
[facility] [place]  Patient name: Bradley [name] MRN: [id number] DOB: 12-Oct-1945 Sex: male   REASON FOR VISIT:    Follow up  [name]:   Bradley [name] a 75 y.o.malewho is back for follow-up. He has a long-term patient of Dr. Donnetta Hutching who has performed an aortobifemoral bypass graft for lower extremity occlusive disease in 1998 and a left carotid endarterectomy in 1994 with a redo in 2001.  He came into the office last week with worsening cramping with ambulation which have been going on for about 5 to 6 weeks.  His x-ray was tolerance went from 15 minutes down to 2 to 3 minutes on the bike before he has severe cramping.  This does improve with rest his ABIs have significantly decreased.  He is back today for a duplex scan.  He was taking his wife to [facility] for a cardiac ablation last week and on the trip down he noticed that his vision in his right eye was blurry. While he was down there he started to shake and was found to be hypertensive with a blood pressure of 195/95. He was sent to the emergency room. He did not have any other localizing symptoms they did a stroke work-up which consisted of a CT scan and carotid Dopplers. By report the CT scan showed a 95% right carotid stenosis. His symptoms resolved.  On 06/01/2020 he underwent right sided TCAR.  Angiographic imaging revealed an 85% stenosis.  This resolved after stenting.  He is without any new neurologic issues.     Patient suffers from coronary artery disease, status post CABG on 05/23/2015. He takes a statin for hypercholesterolemia. He is a diabetic which is moderately well controlled. He is a former smoker having quit in 2010. He is medically managed for hypertension.  He is on Eliquis for atrial fibrillation   PAST MEDICAL HISTORY:   Past Medical History:  Diagnosis Date  . Acquired equinus deformity of foot 12/15/2011  . Anemia   .  Arthritis   . Carotid artery occlusion    a. Carotid US 10/16: RICA 60-79%, L CEA patent with 1-39% stenosis  . CKD (chronic kidney disease)   . Coagulation disorder ([place]) 05/25/2019  . Coronary artery disease    a. Myoview 9/16:  EF 52%, inferior fixed defect consistent with diaphragmatic attenuation, intermediate risk secondary to poor exercise tolerance and symptoms during stress;  b. LHC 05/17/2015 90% mid LAD, 80% ost D2, 75% mid RCA, 35% prox RCA. >> S/p CABG  . Coronary artery disease involving coronary bypass graft of native heart with angina pectoris ([place])    CORONARY ARTERY BYPASS GRAFTING x 3 - 05/23/2015 Left internal mammary artery to left anterior descending  Saphenous vein graft to diagonal  Saphenous vein graft to posterior descending Endoscopic greater saphenous vein harvest right thigh   . Diabetes mellitus age 32  . Diverticulitis   . Diverticulosis of colon without hemorrhage 05/17/2015   By CT scan   . DM type 2, controlled, with complication ([place]) 4/62/7035  . Essential hypertension 04/29/2015  . GERD (gastroesophageal reflux disease)   . H/O hiatal hernia   . History of echocardiogram    a. Echo 9/16: GLS -15.2%, EF 00-93%, grade 1 diastolic dysfunction, normal wall motion, aortic sclerosis, dilated aortic root 39 mm, atrial septal lipomatous hypertrophy  . Hyperlipidemia   . Hypertension   . Irritable bowel syndrome (IBS)   . Myocardial infarction ([place])  silent inferior MI; patient denies MI history (03/17/13)   . Neuropathy 2013  . OSA on CPAP 05/30/2015  . Osteoporosis 2013  . PAF (paroxysmal atrial fibrillation) ([place])    post CABG 2016; Amiodarone stopped 2/2 wheezing; PAF 01/12/20  . Peripheral vascular disease ([place])   . Plantar fasciitis 12/15/2011  . Reflux esophagitis   . Right bundle branch block 04/30/2015  . Sleep apnea    uses CPAP  . Stenosis of right internal carotid artery with cerebral infarction ([place]) 12/09/2011   Right brain CVA 1987   . Stress  fracture of metatarsal bone 02/18/2012  . Stroke [facility]) 1987   Right brain stroke  . Syncope 01/12/2020  . Type 2 diabetes mellitus with vascular disease ([place]) 05/30/2015  . Wears glasses      FAMILY HISTORY:   Family History  Problem Relation Age of Onset  . Cancer Mother 42       pancreatic  . Hyperlipidemia Mother   . Stroke Father 30  . Deep vein thrombosis Father   . Hyperlipidemia Father   . Hypertension Father     SOCIAL HISTORY:   Social History   Tobacco Use  . Smoking status: Former Smoker    Packs/day: 2.00    Years: 40.00    Pack years: 80.00    Types: Cigarettes    Quit date: 03/26/2009    Years since quitting: 11.6  . Smokeless tobacco: Never Used  Substance Use Topics  . Alcohol use: No     ALLERGIES:   Allergies  Allergen Reactions  . Doxazosin Mesylate     Other reaction(s): Diarrhea, dizziness  . Hydrocodone Nausea Only  . Niacin     Other reaction(s): Skin irritation  . Oxycodone Nausea Only  . Penicillins Rash    Has patient had a PCN reaction causing immediate rash, facial/tongue/throat swelling, SOB or lightheadedness with hypotension: NO Has patient had a PCN reaction causing severe rash involving mucus membranes or skin necrosis:NO Has patient had a PCN reaction that required hospitalization NO Has patient had a PCN reaction occurring within the last 10 years: NO If all of the above answers are "NO", then may proceed with Cephalosporin use.   [facility] Cross Reactors Rash     CURRENT MEDICATIONS:   Current Outpatient Medications  Medication Sig Dispense Refill  . amLODipine (NORVASC) 10 MG tablet Take 1 tablet (10 mg total) by mouth daily. 90 tablet 3  . apixaban (ELIQUIS) 5 MG TABS tablet Take 1 tablet (5 mg total) by mouth 2 (two) times daily. 180 tablet 3  . aspirin EC 81 MG tablet Take 81 mg by mouth daily. Swallow whole.    Marland Kitchen atorvastatin (LIPITOR) 40 MG tablet Take 40 mg by mouth daily.    . Cholecalciferol (VITAMIN D3)  125 MCG (5000 UT) TABS Take 5,000 Units by mouth daily.     Marland Kitchen dexlansoprazole (DEXILANT) 60 MG capsule Take 60 mg by mouth daily.     Marland Kitchen FIASP FLEXTOUCH 100 UNIT/ML SOPN Inject 20-25 Units into the skin in the morning, at noon, and at bedtime. Sliding scale as per the carbohydrate intake.    . gabapentin (NEURONTIN) 300 MG capsule Take 600 mg by mouth at bedtime.     . hydrALAZINE (APRESOLINE) 25 MG tablet Take 1 tablet (25 mg total) by mouth in the morning and at bedtime. 180 tablet 3  . hydrocortisone (ANUSOL-HC) 2.5 % rectal cream Place 1 application rectally daily as needed for hemorrhoids.     Marland Kitchen  hydrocortisone 2.5 % cream Apply 1 application topically 2 (two) times daily.    Marland Kitchen ketoconazole (NIZORAL) 2 % shampoo Apply 1 application topically daily as needed for irritation.     Marland Kitchen L-Methylfolate-Algae-B12-B6 (FOLTANX RF) 3-90.314-2-35 MG CAPS Take 1 capsule by mouth in the morning and at bedtime.     . Melatonin 2.5 MG CHEW See admin instructions.    . methocarbamol (ROBAXIN) 500 MG tablet Take 500 mg by mouth every 6 (six) hours as needed for muscle spasms.     . metoprolol tartrate (LOPRESSOR) 50 MG tablet TAKE 1.5 TABLETS BY MOUTH TWICE DAILY WITH FOOD    . nitroGLYCERIN (NITROSTAT) 0.3 MG SL tablet Place 0.3 mg under the tongue every 5 (five) minutes as needed for chest pain. Only as needed    . testosterone cypionate (DEPOTESTOSTERONE CYPIONATE) 200 MG/ML injection Inject 200 mg into the muscle once a week. 0.6 ml    . TRESIBA FLEXTOUCH 100 UNIT/ML SOPN FlexTouch Pen Inject 20-25 Units into the skin daily.     . TRULICITY 9.98 PJ/8.2NK SOPN Inject 0.75 mg into the skin once a week. Sunday morning    . torsemide (DEMADEX) 20 MG tablet Take 1 tablet (20 mg total) by mouth daily as needed (swelling or weight gain of 2 or more pounds overnight.). 90 tablet 3   No current facility-administered medications for this visit.    REVIEW OF SYSTEMS:   [X]  denotes positive finding, [ ]  denotes  negative finding Cardiac  Comments:  Chest pain or chest pressure:    Shortness of breath upon exertion:    Short of breath when lying flat:    Irregular heart rhythm:        Vascular    Pain in calf, thigh, or hip brought on by ambulation: x   Pain in feet at night that wakes you up from your sleep:     Blood clot in your veins:    Leg swelling:         Pulmonary    Oxygen at home:    Productive cough:     Wheezing:         Neurologic    Sudden weakness in arms or legs:     Sudden numbness in arms or legs:     Sudden onset of difficulty speaking or slurred speech:    Temporary loss of vision in one eye:     Problems with dizziness:         Gastrointestinal    Blood in stool:     Vomited blood:         Genitourinary    Burning when urinating:     Blood in urine:        Psychiatric    Major depression:         Hematologic    Bleeding problems:    Problems with blood clotting too easily:        Skin    Rashes or ulcers:        Constitutional    Fever or chills:      PHYSICAL EXAM:   Vitals:   11/12/20 1457  BP: (!) 160/75  Pulse: 73  Resp: 20  Temp: 98.5 F (36.9 C)  SpO2: 95%  Weight: 228 lb (103.4 kg)  Height: 5' 11.5" (1.816 m)    GENERAL: The patient is a well-nourished male, in no acute distress. The vital signs are documented above. CARDIAC: There is a regular rate and rhythm.  VASCULAR: Palpable femoral pulses bilaterally, the left is less prominent than the right.  Pedal pulses are nonpalpable PULMONARY: Non-labored respirations ABDOMEN: Soft and non-tender with normal pitched bowel sounds.  MUSCULOSKELETAL: There are no major deformities or cyanosis. NEUROLOGIC: No focal weakness or paresthesias are detected. SKIN: There are no ulcers or rashes noted. PSYCHIATRIC: The patient has a normal affect.  STUDIES:   Reviewed his studies with the following results: ABI/TBIToday's ABIToday's TBIPrevious ABIPrevious TBI   +-------+-----------+-----------+------------+------------+  Right 0.38    0     0.64    0.59      +-------+-----------+-----------+------------+------------+  Left  0.68    0.54    1.01    0.59      +-------+-----------+-----------+------------+------------+   IVC/Iliac: Right: > 70% stenosis in the distal anastomosis / outflow  artery in the proximal thigh.   Left: The segment of the distal graft visualized appears patent.   Summary:  Right: Probable occlusion / near occlusion noted in the superficial  femoral artery with collaterals visualized in the distal SFA providing  flow on distally. The distal limb of the aorta - bifemoral graft ( CFA  area) appears patent, however increased  velocity in the distal anastamosos suggests 75 - 99% stenosis.   Left: Probable occlusion / near occlusion noted in the superficial femoral  artery with what appears to be collaterals in the distal SFA providing  flow on distally.   MEDICAL ISSUES:   PAD with claudication: Duplex suggest superficial femoral artery occlusion with possible stenosis within the right femoral anastomosis.  He tells me that his symptoms are not tolerable at all.  Sometimes he can barely walk a few steps.  He is actually gotten his scooter out to help with his mobility.  I discussed that the next step is to proceed with angiography.  I will plan on a left femoral access.  If he is a candidate for intervention on the right leg, this may require antegrade access.  I will need to evaluate his aortobifemoral anastomosis to the right groin.  All of his questions were answered today, along with his wife.  He is scheduled for procedure on Tuesday, April 19    Leia Alf, MD, FACS [facility] [phone] Pager [phone]

## 2020-11-12 NOTE — Progress Notes (Signed)
Vascular and Vein Specialist of [place]  Patient name: Bradley [name] MRN: [id number] DOB: Apr 23, 1946 Sex: male   REASON FOR VISIT:    Follow up  [name]:   Bradley [name] a 75 y.o.malewho is back for follow-up. He has a long-term patient of Dr. Donnetta Hutching who has performed an aortobifemoral bypass graft for lower extremity occlusive disease in 1998 and a left carotid endarterectomy in 1994 with a redo in 2001.  He came into the office last week with worsening cramping with ambulation which have been going on for about 5 to 6 weeks.  His x-ray was tolerance went from 15 minutes down to 2 to 3 minutes on the bike before he has severe cramping.  This does improve with rest his ABIs have significantly decreased.  He is back today for a duplex scan.  He was taking his wife to [facility] for a cardiac ablation last week and on the trip down he noticed that his vision in his right eye was blurry. While he was down there he started to shake and was found to be hypertensive with a blood pressure of 195/95. He was sent to the emergency room. He did not have any other localizing symptoms they did a stroke work-up which consisted of a CT scan and carotid Dopplers. By report the CT scan showed a 95% right carotid stenosis. His symptoms resolved.  On 06/01/2020 he underwent right sided TCAR.  Angiographic imaging revealed an 85% stenosis.  This resolved after stenting.  He is without any new neurologic issues.     Patient suffers from coronary artery disease, status post CABG on 05/23/2015. He takes a statin for hypercholesterolemia. He is a diabetic which is moderately well controlled. He is a former smoker having quit in 2010. He is medically managed for hypertension.  He is on Eliquis for atrial fibrillation   PAST MEDICAL HISTORY:   Past Medical History:  Diagnosis Date  . Acquired equinus deformity of foot 12/15/2011  . Anemia   .  Arthritis   . Carotid artery occlusion    a. Carotid US 10/16: RICA 60-79%, L CEA patent with 1-39% stenosis  . CKD (chronic kidney disease)   . Coagulation disorder ([place]) 05/25/2019  . Coronary artery disease    a. Myoview 9/16:  EF 52%, inferior fixed defect consistent with diaphragmatic attenuation, intermediate risk secondary to poor exercise tolerance and symptoms during stress;  b. LHC 05/17/2015 90% mid LAD, 80% ost D2, 75% mid RCA, 35% prox RCA. >> S/p CABG  . Coronary artery disease involving coronary bypass graft of native heart with angina pectoris ([place])    CORONARY ARTERY BYPASS GRAFTING x 3 - 05/23/2015 Left internal mammary artery to left anterior descending  Saphenous vein graft to diagonal  Saphenous vein graft to posterior descending Endoscopic greater saphenous vein harvest right thigh   . Diabetes mellitus age 13  . Diverticulitis   . Diverticulosis of colon without hemorrhage 05/17/2015   By CT scan   . DM type 2, controlled, with complication ([place]) 3/61/4431  . Essential hypertension 04/29/2015  . GERD (gastroesophageal reflux disease)   . H/O hiatal hernia   . History of echocardiogram    a. Echo 9/16: GLS -15.2%, EF 54-00%, grade 1 diastolic dysfunction, normal wall motion, aortic sclerosis, dilated aortic root 39 mm, atrial septal lipomatous hypertrophy  . Hyperlipidemia   . Hypertension   . Irritable bowel syndrome (IBS)   . Myocardial infarction ([place])  silent inferior MI; patient denies MI history (03/17/13)   . Neuropathy 2013  . OSA on CPAP 05/30/2015  . Osteoporosis 2013  . PAF (paroxysmal atrial fibrillation) ([place])    post CABG 2016; Amiodarone stopped 2/2 wheezing; PAF 01/12/20  . Peripheral vascular disease ([place])   . Plantar fasciitis 12/15/2011  . Reflux esophagitis   . Right bundle branch block 04/30/2015  . Sleep apnea    uses CPAP  . Stenosis of right internal carotid artery with cerebral infarction ([place]) 12/09/2011   Right brain CVA 1987   . Stress  fracture of metatarsal bone 02/18/2012  . Stroke [facility]) 1987   Right brain stroke  . Syncope 01/12/2020  . Type 2 diabetes mellitus with vascular disease ([place]) 05/30/2015  . Wears glasses      FAMILY HISTORY:   Family History  Problem Relation Age of Onset  . Cancer Mother 73       pancreatic  . Hyperlipidemia Mother   . Stroke Father 27  . Deep vein thrombosis Father   . Hyperlipidemia Father   . Hypertension Father     SOCIAL HISTORY:   Social History   Tobacco Use  . Smoking status: Former Smoker    Packs/day: 2.00    Years: 40.00    Pack years: 80.00    Types: Cigarettes    Quit date: 03/26/2009    Years since quitting: 11.6  . Smokeless tobacco: Never Used  Substance Use Topics  . Alcohol use: No     ALLERGIES:   Allergies  Allergen Reactions  . Doxazosin Mesylate     Other reaction(s): Diarrhea, dizziness  . Hydrocodone Nausea Only  . Niacin     Other reaction(s): Skin irritation  . Oxycodone Nausea Only  . Penicillins Rash    Has patient had a PCN reaction causing immediate rash, facial/tongue/throat swelling, SOB or lightheadedness with hypotension: NO Has patient had a PCN reaction causing severe rash involving mucus membranes or skin necrosis:NO Has patient had a PCN reaction that required hospitalization NO Has patient had a PCN reaction occurring within the last 10 years: NO If all of the above answers are "NO", then may proceed with Cephalosporin use.   [facility] Cross Reactors Rash     CURRENT MEDICATIONS:   Current Outpatient Medications  Medication Sig Dispense Refill  . amLODipine (NORVASC) 10 MG tablet Take 1 tablet (10 mg total) by mouth daily. 90 tablet 3  . apixaban (ELIQUIS) 5 MG TABS tablet Take 1 tablet (5 mg total) by mouth 2 (two) times daily. 180 tablet 3  . aspirin EC 81 MG tablet Take 81 mg by mouth daily. Swallow whole.    Marland Kitchen atorvastatin (LIPITOR) 40 MG tablet Take 40 mg by mouth daily.    . Cholecalciferol (VITAMIN D3)  125 MCG (5000 UT) TABS Take 5,000 Units by mouth daily.     Marland Kitchen dexlansoprazole (DEXILANT) 60 MG capsule Take 60 mg by mouth daily.     Marland Kitchen FIASP FLEXTOUCH 100 UNIT/ML SOPN Inject 20-25 Units into the skin in the morning, at noon, and at bedtime. Sliding scale as per the carbohydrate intake.    . gabapentin (NEURONTIN) 300 MG capsule Take 600 mg by mouth at bedtime.     . hydrALAZINE (APRESOLINE) 25 MG tablet Take 1 tablet (25 mg total) by mouth in the morning and at bedtime. 180 tablet 3  . hydrocortisone (ANUSOL-HC) 2.5 % rectal cream Place 1 application rectally daily as needed for hemorrhoids.     Marland Kitchen  hydrocortisone 2.5 % cream Apply 1 application topically 2 (two) times daily.    Marland Kitchen ketoconazole (NIZORAL) 2 % shampoo Apply 1 application topically daily as needed for irritation.     Marland Kitchen L-Methylfolate-Algae-B12-B6 (FOLTANX RF) 3-90.314-2-35 MG CAPS Take 1 capsule by mouth in the morning and at bedtime.     . Melatonin 2.5 MG CHEW See admin instructions.    . methocarbamol (ROBAXIN) 500 MG tablet Take 500 mg by mouth every 6 (six) hours as needed for muscle spasms.     . metoprolol tartrate (LOPRESSOR) 50 MG tablet TAKE 1.5 TABLETS BY MOUTH TWICE DAILY WITH FOOD    . nitroGLYCERIN (NITROSTAT) 0.3 MG SL tablet Place 0.3 mg under the tongue every 5 (five) minutes as needed for chest pain. Only as needed    . testosterone cypionate (DEPOTESTOSTERONE CYPIONATE) 200 MG/ML injection Inject 200 mg into the muscle once a week. 0.6 ml    . TRESIBA FLEXTOUCH 100 UNIT/ML SOPN FlexTouch Pen Inject 20-25 Units into the skin daily.     . TRULICITY 1.06 YI/9.4WN SOPN Inject 0.75 mg into the skin once a week. Sunday morning    . torsemide (DEMADEX) 20 MG tablet Take 1 tablet (20 mg total) by mouth daily as needed (swelling or weight gain of 2 or more pounds overnight.). 90 tablet 3   No current facility-administered medications for this visit.    REVIEW OF SYSTEMS:   [X]  denotes positive finding, [ ]  denotes  negative finding Cardiac  Comments:  Chest pain or chest pressure:    Shortness of breath upon exertion:    Short of breath when lying flat:    Irregular heart rhythm:        Vascular    Pain in calf, thigh, or hip brought on by ambulation: x   Pain in feet at night that wakes you up from your sleep:     Blood clot in your veins:    Leg swelling:         Pulmonary    Oxygen at home:    Productive cough:     Wheezing:         Neurologic    Sudden weakness in arms or legs:     Sudden numbness in arms or legs:     Sudden onset of difficulty speaking or slurred speech:    Temporary loss of vision in one eye:     Problems with dizziness:         Gastrointestinal    Blood in stool:     Vomited blood:         Genitourinary    Burning when urinating:     Blood in urine:        Psychiatric    Major depression:         Hematologic    Bleeding problems:    Problems with blood clotting too easily:        Skin    Rashes or ulcers:        Constitutional    Fever or chills:      PHYSICAL EXAM:   Vitals:   11/12/20 1457  BP: (!) 160/75  Pulse: 73  Resp: 20  Temp: 98.5 F (36.9 C)  SpO2: 95%  Weight: 228 lb (103.4 kg)  Height: 5' 11.5" (1.816 m)    GENERAL: The patient is a well-nourished male, in no acute distress. The vital signs are documented above. CARDIAC: There is a regular rate and rhythm.  VASCULAR: Palpable femoral pulses bilaterally, the left is less prominent than the right.  Pedal pulses are nonpalpable PULMONARY: Non-labored respirations ABDOMEN: Soft and non-tender with normal pitched bowel sounds.  MUSCULOSKELETAL: There are no major deformities or cyanosis. NEUROLOGIC: No focal weakness or paresthesias are detected. SKIN: There are no ulcers or rashes noted. PSYCHIATRIC: The patient has a normal affect.  STUDIES:   Reviewed his studies with the following results: ABI/TBIToday's ABIToday's TBIPrevious ABIPrevious TBI   +-------+-----------+-----------+------------+------------+  Right 0.38    0     0.64    0.59      +-------+-----------+-----------+------------+------------+  Left  0.68    0.54    1.01    0.59      +-------+-----------+-----------+------------+------------+   IVC/Iliac: Right: > 70% stenosis in the distal anastomosis / outflow  artery in the proximal thigh.   Left: The segment of the distal graft visualized appears patent.   Summary:  Right: Probable occlusion / near occlusion noted in the superficial  femoral artery with collaterals visualized in the distal SFA providing  flow on distally. The distal limb of the aorta - bifemoral graft ( CFA  area) appears patent, however increased  velocity in the distal anastamosos suggests 75 - 99% stenosis.   Left: Probable occlusion / near occlusion noted in the superficial femoral  artery with what appears to be collaterals in the distal SFA providing  flow on distally.   MEDICAL ISSUES:   PAD with claudication: Duplex suggest superficial femoral artery occlusion with possible stenosis within the right femoral anastomosis.  He tells me that his symptoms are not tolerable at all.  Sometimes he can barely walk a few steps.  He is actually gotten his scooter out to help with his mobility.  I discussed that the next step is to proceed with angiography.  I will plan on a left femoral access.  If he is a candidate for intervention on the right leg, this may require antegrade access.  I will need to evaluate his aortobifemoral anastomosis to the right groin.  All of his questions were answered today, along with his wife.  He is scheduled for procedure on Tuesday, April 19    Leia Alf, MD, FACS [facility] [phone] Pager [phone]

## 2020-11-13 ENCOUNTER — Other Ambulatory Visit: Payer: Self-pay

## 2020-11-15 ENCOUNTER — Encounter (HOSPITAL_COMMUNITY): Payer: PPO

## 2020-11-20 DIAGNOSIS — M5033 Other cervical disc degeneration, cervicothoracic region: Secondary | ICD-10-CM | POA: Diagnosis not present

## 2020-11-20 DIAGNOSIS — M9902 Segmental and somatic dysfunction of thoracic region: Secondary | ICD-10-CM | POA: Diagnosis not present

## 2020-11-20 DIAGNOSIS — M5412 Radiculopathy, cervical region: Secondary | ICD-10-CM | POA: Diagnosis not present

## 2020-11-20 DIAGNOSIS — M9901 Segmental and somatic dysfunction of cervical region: Secondary | ICD-10-CM | POA: Diagnosis not present

## 2020-11-21 ENCOUNTER — Encounter (HOSPITAL_COMMUNITY): Payer: PPO

## 2020-11-23 ENCOUNTER — Other Ambulatory Visit
Admission: RE | Admit: 2020-11-23 | Discharge: 2020-11-23 | Disposition: A | Discharge status: Home / Self Care | Payer: PPO | Source: Ambulatory Visit | Attending: Surgery | Admitting: Surgery

## 2020-11-23 ENCOUNTER — Other Ambulatory Visit: Payer: Self-pay

## 2020-11-23 DIAGNOSIS — Z20822 Contact with and (suspected) exposure to covid-19: Secondary | ICD-10-CM | POA: Insufficient documentation

## 2020-11-23 DIAGNOSIS — Z01812 Encounter for preprocedural laboratory examination: Secondary | ICD-10-CM | POA: Insufficient documentation

## 2020-11-23 LAB — SARS CORONAVIRUS 2 (TAT 6-24 HRS): SARS Coronavirus 2: NEGATIVE

## 2020-11-26 ENCOUNTER — Ambulatory Visit: Payer: PPO | Admitting: Surgery

## 2020-11-27 ENCOUNTER — Telehealth: Payer: Self-pay | Admitting: Interventional Cardiology

## 2020-11-27 ENCOUNTER — Encounter (HOSPITAL_COMMUNITY)
Admission: RE | Disposition: A | Discharge status: Home / Self Care | Payer: Self-pay | Source: Home / Self Care | Attending: Surgery

## 2020-11-27 ENCOUNTER — Other Ambulatory Visit: Payer: Self-pay

## 2020-11-27 ENCOUNTER — Ambulatory Visit (HOSPITAL_COMMUNITY)
Admission: RE | Admit: 2020-11-27 | Discharge: 2020-11-27 | Disposition: A | Discharge status: Home / Self Care | Payer: PPO | Source: Home / Self Care | Attending: Surgery | Admitting: Surgery

## 2020-11-27 DIAGNOSIS — Z882 Allergy status to sulfonamides status: Secondary | ICD-10-CM | POA: Insufficient documentation

## 2020-11-27 DIAGNOSIS — Z885 Allergy status to narcotic agent status: Secondary | ICD-10-CM | POA: Diagnosis not present

## 2020-11-27 DIAGNOSIS — Z8673 Personal history of transient ischemic attack (TIA), and cerebral infarction without residual deficits: Secondary | ICD-10-CM | POA: Diagnosis not present

## 2020-11-27 DIAGNOSIS — I2510 Atherosclerotic heart disease of native coronary artery without angina pectoris: Secondary | ICD-10-CM | POA: Insufficient documentation

## 2020-11-27 DIAGNOSIS — I25810 Atherosclerosis of coronary artery bypass graft(s) without angina pectoris: Secondary | ICD-10-CM | POA: Diagnosis present

## 2020-11-27 DIAGNOSIS — M1990 Unspecified osteoarthritis, unspecified site: Secondary | ICD-10-CM | POA: Diagnosis present

## 2020-11-27 DIAGNOSIS — I4891 Unspecified atrial fibrillation: Secondary | ICD-10-CM | POA: Insufficient documentation

## 2020-11-27 DIAGNOSIS — Z951 Presence of aortocoronary bypass graft: Secondary | ICD-10-CM | POA: Insufficient documentation

## 2020-11-27 DIAGNOSIS — E1151 Type 2 diabetes mellitus with diabetic peripheral angiopathy without gangrene: Secondary | ICD-10-CM | POA: Insufficient documentation

## 2020-11-27 DIAGNOSIS — Z01812 Encounter for preprocedural laboratory examination: Secondary | ICD-10-CM | POA: Diagnosis present

## 2020-11-27 DIAGNOSIS — I10 Essential (primary) hypertension: Secondary | ICD-10-CM | POA: Insufficient documentation

## 2020-11-27 DIAGNOSIS — N183 Chronic kidney disease, stage 3 unspecified: Secondary | ICD-10-CM | POA: Diagnosis present

## 2020-11-27 DIAGNOSIS — T82858A Stenosis of vascular prosthetic devices, implants and grafts, initial encounter: Secondary | ICD-10-CM | POA: Diagnosis present

## 2020-11-27 DIAGNOSIS — Z87891 Personal history of nicotine dependence: Secondary | ICD-10-CM | POA: Insufficient documentation

## 2020-11-27 DIAGNOSIS — G4733 Obstructive sleep apnea (adult) (pediatric): Secondary | ICD-10-CM | POA: Diagnosis present

## 2020-11-27 DIAGNOSIS — Z880 Allergy status to penicillin: Secondary | ICD-10-CM | POA: Insufficient documentation

## 2020-11-27 DIAGNOSIS — Z79899 Other long term (current) drug therapy: Secondary | ICD-10-CM | POA: Insufficient documentation

## 2020-11-27 DIAGNOSIS — Z7901 Long term (current) use of anticoagulants: Secondary | ICD-10-CM | POA: Insufficient documentation

## 2020-11-27 DIAGNOSIS — I70213 Atherosclerosis of native arteries of extremities with intermittent claudication, bilateral legs: Secondary | ICD-10-CM | POA: Insufficient documentation

## 2020-11-27 DIAGNOSIS — Z794 Long term (current) use of insulin: Secondary | ICD-10-CM | POA: Diagnosis not present

## 2020-11-27 DIAGNOSIS — Z20822 Contact with and (suspected) exposure to covid-19: Secondary | ICD-10-CM | POA: Diagnosis not present

## 2020-11-27 DIAGNOSIS — E7800 Pure hypercholesterolemia, unspecified: Secondary | ICD-10-CM | POA: Insufficient documentation

## 2020-11-27 DIAGNOSIS — Y713 Surgical instruments, materials and cardiovascular devices (including sutures) associated with adverse incidents: Secondary | ICD-10-CM | POA: Diagnosis present

## 2020-11-27 DIAGNOSIS — Z7982 Long term (current) use of aspirin: Secondary | ICD-10-CM | POA: Insufficient documentation

## 2020-11-27 DIAGNOSIS — I771 Stricture of artery: Secondary | ICD-10-CM | POA: Diagnosis not present

## 2020-11-27 DIAGNOSIS — I252 Old myocardial infarction: Secondary | ICD-10-CM | POA: Diagnosis not present

## 2020-11-27 DIAGNOSIS — I70221 Atherosclerosis of native arteries of extremities with rest pain, right leg: Secondary | ICD-10-CM | POA: Diagnosis present

## 2020-11-27 DIAGNOSIS — Z888 Allergy status to other drugs, medicaments and biological substances status: Secondary | ICD-10-CM | POA: Diagnosis not present

## 2020-11-27 DIAGNOSIS — E785 Hyperlipidemia, unspecified: Secondary | ICD-10-CM | POA: Diagnosis present

## 2020-11-27 DIAGNOSIS — I480 Paroxysmal atrial fibrillation: Secondary | ICD-10-CM | POA: Diagnosis present

## 2020-11-27 DIAGNOSIS — E1122 Type 2 diabetes mellitus with diabetic chronic kidney disease: Secondary | ICD-10-CM | POA: Diagnosis present

## 2020-11-27 DIAGNOSIS — I70311 Atherosclerosis of unspecified type of bypass graft(s) of the extremities with intermittent claudication, right leg: Secondary | ICD-10-CM | POA: Diagnosis not present

## 2020-11-27 HISTORY — PX: ABDOMINAL AORTOGRAM W/LOWER EXTREMITY: CATH118223

## 2020-11-27 LAB — POCT I-STAT, CHEM 8
BUN: 31 mg/dL — ABNORMAL HIGH (ref 8–23)
Calcium, Ion: 1.25 mmol/L (ref 1.15–1.40)
Chloride: 101 mmol/L (ref 98–111)
Creatinine, Ser: 2.6 mg/dL — ABNORMAL HIGH (ref 0.61–1.24)
Glucose, Bld: 182 mg/dL — ABNORMAL HIGH (ref 70–99)
HCT: 45 % (ref 39.0–52.0)
Hemoglobin: 15.3 g/dL (ref 13.0–17.0)
Potassium: 3.9 mmol/L (ref 3.5–5.1)
Sodium: 140 mmol/L (ref 135–145)
TCO2: 27 mmol/L (ref 22–32)

## 2020-11-27 LAB — GLUCOSE, CAPILLARY: Glucose-Capillary: 97 mg/dL (ref 70–99)

## 2020-11-27 SURGERY — ABDOMINAL AORTOGRAM W/LOWER EXTREMITY
Anesthesia: LOCAL | Laterality: Bilateral

## 2020-11-27 MED ORDER — HYDRALAZINE HCL 20 MG/ML IJ SOLN: 5.0000 mg | INTRAMUSCULAR | Status: DC | PRN

## 2020-11-27 MED ORDER — HEPARIN (PORCINE) IN NACL 1000-0.9 UT/500ML-% IV SOLN
INTRAVENOUS | Status: AC
  Filled 2020-11-27: qty 1000

## 2020-11-27 MED ORDER — HEPARIN (PORCINE) IN NACL 1000-0.9 UT/500ML-% IV SOLN
INTRAVENOUS | Status: DC | PRN
  Administered 2020-11-27 (×2): 500 mL

## 2020-11-27 MED ORDER — SODIUM CHLORIDE 0.9 % IV SOLN: 250.0000 mL | INTRAVENOUS | Status: DC | PRN

## 2020-11-27 MED ORDER — FENTANYL CITRATE (PF) 100 MCG/2ML IJ SOLN
INTRAMUSCULAR | Status: AC
  Filled 2020-11-27: qty 2

## 2020-11-27 MED ORDER — ONDANSETRON HCL 4 MG/2ML IJ SOLN
4.0000 mg | Freq: Four times a day (QID) | INTRAMUSCULAR | Status: DC | PRN
Start: 2020-11-27 — End: 2020-11-28

## 2020-11-27 MED ORDER — MIDAZOLAM HCL 2 MG/2ML IJ SOLN
INTRAMUSCULAR | Status: AC
  Filled 2020-11-27: qty 2

## 2020-11-27 MED ORDER — SODIUM CHLORIDE 0.9% FLUSH: 3.0000 mL | Freq: Two times a day (BID) | INTRAVENOUS | Status: DC

## 2020-11-27 MED ORDER — FENTANYL CITRATE (PF) 100 MCG/2ML IJ SOLN
INTRAMUSCULAR | Status: DC | PRN
  Administered 2020-11-27: 50 ug via INTRAVENOUS

## 2020-11-27 MED ORDER — LIDOCAINE HCL (PF) 1 % IJ SOLN
INTRAMUSCULAR | Status: AC
  Filled 2020-11-27: qty 30

## 2020-11-27 MED ORDER — LIDOCAINE HCL (PF) 1 % IJ SOLN
INTRAMUSCULAR | Status: DC | PRN
  Administered 2020-11-27: 18 mL

## 2020-11-27 MED ORDER — ACETAMINOPHEN 325 MG PO TABS: 650.0000 mg | ORAL_TABLET | ORAL | Status: DC | PRN

## 2020-11-27 MED ORDER — SODIUM CHLORIDE 0.9% FLUSH: 3.0000 mL | INTRAVENOUS | Status: DC | PRN

## 2020-11-27 MED ORDER — SODIUM CHLORIDE 0.9 % WEIGHT BASED INFUSION: 1.0000 mL/kg/h | INTRAVENOUS | Status: DC

## 2020-11-27 MED ORDER — MORPHINE SULFATE (PF) 2 MG/ML IV SOLN: 2.0000 mg | INTRAVENOUS | Status: DC | PRN

## 2020-11-27 MED ORDER — LABETALOL HCL 5 MG/ML IV SOLN: 10.0000 mg | INTRAVENOUS | Status: DC | PRN

## 2020-11-27 MED ORDER — MIDAZOLAM HCL 2 MG/2ML IJ SOLN
INTRAMUSCULAR | Status: DC | PRN
  Administered 2020-11-27: 2 mg via INTRAVENOUS

## 2020-11-27 MED ORDER — SODIUM CHLORIDE 0.9 % IV SOLN: INTRAVENOUS | Status: DC

## 2020-11-27 SURGICAL SUPPLY — 12 items
CATH OMNI FLUSH 5F 65CM (CATHETERS) ×2 IMPLANT
CATH SOFTOUCH MOTARJEME 5F (CATHETERS) ×2 IMPLANT
FILTER CO2 0.2 MICRON (VASCULAR PRODUCTS) ×2 IMPLANT
KIT MICROPUNCTURE NIT STIFF (SHEATH) ×2 IMPLANT
KIT PV (KITS) ×2 IMPLANT
RESERVOIR CO2 (VASCULAR PRODUCTS) ×2 IMPLANT
SET FLUSH CO2 (MISCELLANEOUS) ×2 IMPLANT
SHEATH PINNACLE 5F 10CM (SHEATH) ×2 IMPLANT
SHEATH PROBE COVER 6X72 (BAG) ×2 IMPLANT
TRANSDUCER W/STOPCOCK (MISCELLANEOUS) ×2 IMPLANT
TRAY PV CATH (CUSTOM PROCEDURE TRAY) ×2 IMPLANT
WIRE BENTSON .035X145CM (WIRE) ×2 IMPLANT

## 2020-11-27 NOTE — Progress Notes (Signed)
Up and walked and tolerated well; left groin stable, no bleeding or hematoma 

## 2020-11-27 NOTE — Interval H&P Note (Signed)
History and Physical Interval Note:  11/27/2020 1:07 PM  Bradley [name]  has presented today for surgery, with the diagnosis of claudication.  The various methods of treatment have been discussed with the patient and family. After consideration of risks, benefits and other options for treatment, the patient has consented to  Procedure(s): ABDOMINAL AORTOGRAM W/LOWER EXTREMITY (N/A) as a surgical intervention.  The patient's history has been reviewed, patient examined, no change in status, stable for surgery.  I have reviewed the patient's chart and labs.  Questions were answered to the patient's satisfaction.     [name]

## 2020-11-27 NOTE — Op Note (Signed)
    Patient name: Bradley [name] MRN: [id number] DOB: 1946/07/12 Sex: male  11/27/2020 Pre-operative Diagnosis: Bilateral claudication Post-operative diagnosis:  Same Surgeon:  [name] Procedure Performed:  1.  Ultrasound-guided access, left femoral artery  2.  Abdominal aortogram  3.  Bilateral lower extremity runoff  4.  First order catheterization  5.  Conscious sedation, 29 minutes    Indications: This is a 75 year old gentleman with severe bilateral claudication he comes in today for angiography.  Procedure:  The patient was identified in the holding area and taken to room 8.  The patient was then placed supine on the table and prepped and draped in the usual sterile fashion.  A time out was called.  Conscious sedation was administered with the use of IV fentanyl and Versed under continuous physician and nurse monitoring.  Heart rate, blood pressure, and oxygen saturation were continuously monitored.  Total sedation time was 29 minutes.  Ultrasound was used to evaluate the left common femoral artery.  It was patent .  A digital ultrasound image was acquired.  A micropuncture needle was used to access the left common femoral artery under ultrasound guidance.  An 018 wire was advanced without resistance and a micropuncture sheath was placed.  The 018 wire was removed and a benson wire was placed.  The micropuncture sheath was exchanged for a 5 french sheath.  An omniflush catheter was advanced over the wire to the level of L-1.  An abdominal angiogram with CO2 was obtained.  Next, the catheter was pulled down to the bifurcation and bilateral pelvic angiograms were performed in different obliques.  Next using a reverse curve catheter, the catheter was placed into the right limb of the aortobifemoral graft and right leg runoff with CO2 was performed.  CO2 injections were performed through the sheath to evaluate the left leg  Findings:   Aortogram: Renal arteries were not well visualized.   The native aorta is widely patent as is the aortobifemoral graft.  The proximal anastomosis is widely patent.  Both limbs of the graft are without stenosis.  However, at the anastomosis on the right side there is a near total occlusive lesion  Right Lower Extremity: Nearly occlusive lesion within the right femoral anastomosis.  The profundofemoral artery is widely patent.  There is a flush occlusion of the right superficial femoral artery with reconstitution of the above-knee popliteal artery in the adductor canal with three-vessel runoff  Left Lower Extremity: The femoral anastomosis in the left groin is widely patent.  The profundofemoral artery is patent without stenosis the superficial femoral artery is occluded with reconstitution of the popliteal artery in the adductor canal.  Runoff evaluation was somewhat limited however there appears to be a posterior tibial that crosses the ankle as well as a patent peroneal  Intervention: None  Impression:  #1  High-grade stenosis within the right femoral anastomosis of the aortobifemoral graft  #2  Bilateral superficial femoral artery occlusion  #3  Patient will be considered for surgical revascularization    V. [name], M.D., [facility] [place] Office: [phone] Pager:  [phone]

## 2020-11-27 NOTE — H&P (View-Only) (Signed)
Patient ID: Bradley [name], male   DOB: [birth year], 75 y.o.   MRN: [id number]  Progress Note    11/27/2020 4:45 PM Day of Surgery  Subjective: Comfortable in postprocedure area.  His wife is present with him.  Patient well-known to me from multiple vascular surgery procedures dating back to 81.  Over the past 2 to 3 months had marked worsening of his claudication symptoms analysis not tolerable.  He reports this is somewhat worse in the right leg and left leg.  Fortunately no tissue loss.   Vitals:   11/27/20 1606 11/27/20 1622  BP: (!) 168/84 (!) 146/36  Pulse: 84 81  Resp:    Temp:    SpO2: 97% 96%   Physical Exam: Absent pedal pulses  CBC    Component Value Date/Time   WBC 9.3 06/02/2020 0510   RBC 4.03 (L) 06/02/2020 0510   HGB 15.3 11/27/2020 0940   HGB 13.9 02/01/2020 1219   HCT 45.0 11/27/2020 0940   HCT 39.6 02/01/2020 1219   PLT 186 06/02/2020 0510   PLT 258 02/01/2020 1219   MCV 100.2 (H) 06/02/2020 0510   MCV 98 (H) 02/01/2020 1219   MCH 32.8 06/02/2020 0510   MCHC 32.7 06/02/2020 0510   RDW 12.7 06/02/2020 0510   RDW 13.1 02/01/2020 1219   LYMPHSABS 1.2 11/13/2017 1505   MONOABS 0.7 11/13/2017 1505   EOSABS 0.2 11/13/2017 1505   BASOSABS 0.0 11/13/2017 1505    BMET    Component Value Date/Time   NA 140 11/27/2020 0940   NA 134 02/01/2020 1219   K 3.9 11/27/2020 0940   CL 101 11/27/2020 0940   CO2 25 06/02/2020 0510   GLUCOSE 182 (H) 11/27/2020 0940   BUN 31 (H) 11/27/2020 0940   BUN 21 02/01/2020 1219   CREATININE 2.60 (H) 11/27/2020 0940   CREATININE 1.76 (H) 09/19/2015 1111   CALCIUM 8.4 (L) 06/02/2020 0510   GFRNONAA 27 (L) 06/02/2020 0510   GFRAA 41 (L) 02/01/2020 1219    INR    Component Value Date/Time   INR 1.0 06/01/2020 0553    No intake or output data in the 24 hours ending 11/27/20 1645   I reviewed his arteriogram from today discussing with Dr. Trula Slade.  He has had widely patent aortobifemoral bypass.  He does have  critical stenosis of lower focal area at the distal anastomosis of his femoral and limb on the right.  He has profunda runoff bilaterally with occlusion of the superficial femoral artery at the origin bilaterally.  He does have reconstitution of above-knee popliteal and apparent three-vessel runoff.  He did not have a CO2 angiogram due to his renal insufficiency with a creatinine of 2.6.  Assessment/Plan:  75 y.o. male severe limiting claudication right greater than left.  Also concerning that he has a critical stenosis at the anastomosis of his right limb of his aortofemoral bypass putting this limit risk.  He would have profound ischemia should this occluded.  I have recommended right groin exploration with treatment of his high-grade stenosis of his distal anastomosis to restore flow into his deep femoral artery.  We will also plan right femoral to above-knee popliteal bypass.  He has had prior coronary artery bypass grafting.  On physical exam I do not see any scarring from endovenous harvest on his right leg.  Does appear to have been a small incision on the left leg just below his knee and a expected harvest site.  Will next determination at  time of surgery to proceed with vein bypass if available and would use of prosthetic if it is not available.  Discussed this with the patient who understands.  Will touch base with cardiology prior to surgery for clearance.     Rosetta Posner, MD FACS [facility] [phone] 11/27/2020 4:45 PM

## 2020-11-27 NOTE — Telephone Encounter (Signed)
   [facility] Pre-operative Risk Assessment    Patient Name: Bradley [name]   [facility] STAFF: - Please ensure there is not already an duplicate clearance open for this procedure. - Under Visit Info/Reason for Call, type in Other and utilize the format Clearance MM/DD/YY or Clearance TBD. Do not use dashes or single digits. - If request is for dental extraction, please clarify the # of teeth to be extracted.  Request for surgical clearance:  1. What type of surgery is being performed? fem end fempop  2. When is this surgery scheduled? 11/30/2020   3. What type of clearance is required (medical clearance vs. Pharmacy clearance to hold med vs. Both)? Both  4. Are there any medications that need to be held prior to surgery and how long? Leaving up to cardiology  5. Practice name and name of physician performing surgery? [facility], Dr. Donnetta Hutching  6. What is the office phone number? [phone]   7.   What is the office fax number? [phone], Attention: Alex  8.   Anesthesia type (None, local, MAC, general) ? general   Bradley [name] 11/27/2020, 4:45 PM  _________________________________________________________________   (provider comments below)

## 2020-11-27 NOTE — Telephone Encounter (Signed)
Unable to reach the patient, he is currently admitted in the hospital for lower extremity angiography. Vascular surgeon recommended femoropopliteal bypass surgery.  We will try to reach the patient tomorrow.  He was last seen by Dr. Tamala Julian in January.  Given the urgent nature of this high risk procedure, I will call the patient tomorrow morning and attempt to talk to Dr. Tamala Julian regarding cardiac clearance afterward.

## 2020-11-27 NOTE — Progress Notes (Signed)
Patient ID: Bradley [name], male   DOB: 1946/05/31, 75 y.o.   MRN: [id number]  Progress Note    11/27/2020 4:45 PM Day of Surgery  Subjective: Comfortable in postprocedure area.  His wife is present with him.  Patient well-known to me from multiple vascular surgery procedures dating back to 69.  Over the past 2 to 3 months had marked worsening of his claudication symptoms analysis not tolerable.  He reports this is somewhat worse in the right leg and left leg.  Fortunately no tissue loss.   Vitals:   11/27/20 1606 11/27/20 1622  BP: (!) 168/84 (!) 146/36  Pulse: 84 81  Resp:    Temp:    SpO2: 97% 96%   Physical Exam: Absent pedal pulses  CBC    Component Value Date/Time   WBC 9.3 06/02/2020 0510   RBC 4.03 (L) 06/02/2020 0510   HGB 15.3 11/27/2020 0940   HGB 13.9 02/01/2020 1219   HCT 45.0 11/27/2020 0940   HCT 39.6 02/01/2020 1219   PLT 186 06/02/2020 0510   PLT 258 02/01/2020 1219   MCV 100.2 (H) 06/02/2020 0510   MCV 98 (H) 02/01/2020 1219   MCH 32.8 06/02/2020 0510   MCHC 32.7 06/02/2020 0510   RDW 12.7 06/02/2020 0510   RDW 13.1 02/01/2020 1219   LYMPHSABS 1.2 11/13/2017 1505   MONOABS 0.7 11/13/2017 1505   EOSABS 0.2 11/13/2017 1505   BASOSABS 0.0 11/13/2017 1505    BMET    Component Value Date/Time   NA 140 11/27/2020 0940   NA 134 02/01/2020 1219   K 3.9 11/27/2020 0940   CL 101 11/27/2020 0940   CO2 25 06/02/2020 0510   GLUCOSE 182 (H) 11/27/2020 0940   BUN 31 (H) 11/27/2020 0940   BUN 21 02/01/2020 1219   CREATININE 2.60 (H) 11/27/2020 0940   CREATININE 1.76 (H) 09/19/2015 1111   CALCIUM 8.4 (L) 06/02/2020 0510   GFRNONAA 27 (L) 06/02/2020 0510   GFRAA 41 (L) 02/01/2020 1219    INR    Component Value Date/Time   INR 1.0 06/01/2020 0553    No intake or output data in the 24 hours ending 11/27/20 1645   I reviewed his arteriogram from today discussing with Dr. Trula Slade.  He has had widely patent aortobifemoral bypass.  He does have  critical stenosis of lower focal area at the distal anastomosis of his femoral and limb on the right.  He has profunda runoff bilaterally with occlusion of the superficial femoral artery at the origin bilaterally.  He does have reconstitution of above-knee popliteal and apparent three-vessel runoff.  He did not have a CO2 angiogram due to his renal insufficiency with a creatinine of 2.6.  Assessment/Plan:  75 y.o. male severe limiting claudication right greater than left.  Also concerning that he has a critical stenosis at the anastomosis of his right limb of his aortofemoral bypass putting this limit risk.  He would have profound ischemia should this occluded.  I have recommended right groin exploration with treatment of his high-grade stenosis of his distal anastomosis to restore flow into his deep femoral artery.  We will also plan right femoral to above-knee popliteal bypass.  He has had prior coronary artery bypass grafting.  On physical exam I do not see any scarring from endovenous harvest on his right leg.  Does appear to have been a small incision on the left leg just below his knee and a expected harvest site.  Will next determination at  time of surgery to proceed with vein bypass if available and would use of prosthetic if it is not available.  Discussed this with the patient who understands.  Will touch base with cardiology prior to surgery for clearance.     Rosetta Posner, MD FACS [facility] [phone] 11/27/2020 4:45 PM

## 2020-11-27 NOTE — Discharge Instructions (Signed)
Femoral Site Care  This sheet gives you information about how to care for yourself after your procedure. Your health care provider may also give you more specific instructions. If you have problems or questions, contact your health care provider. What can I expect after the procedure? After the procedure, it is common to have:  Bruising that usually fades within 1-2 weeks.  Tenderness at the site. Follow these instructions at home: Wound care  Follow instructions from your health care provider about how to take care of your insertion site. Make sure you: ? Wash your hands with soap and water before you change your bandage (dressing). If soap and water are not available, use hand sanitizer. ? Change your dressing as told by your health care provider. ? Leave stitches (sutures), skin glue, or adhesive strips in place. These skin closures may need to stay in place for 2 weeks or longer. If adhesive strip edges start to loosen and curl up, you may trim the loose edges. Do not remove adhesive strips completely unless your health care provider tells you to do that.  Do not take baths, swim, or use a hot tub until your health care provider approves.  You may shower 24-48 hours after the procedure or as told by your health care provider. ? Gently wash the site with plain soap and water. ? Pat the area dry with a clean towel. ? Do not rub the site. This may cause bleeding.  Do not apply powder or lotion to the site. Keep the site clean and dry.  Check your femoral site every day for signs of infection. Check for: ? Redness, swelling, or pain. ? Fluid or blood. ? Warmth. ? Pus or a bad smell. Activity  For the first 2-3 days after your procedure, or as long as directed: ? Avoid climbing stairs as much as possible. ? Do not squat.  Do not lift anything that is heavier than 10 lb (4.5 kg), or the limit that you are told, until your health care provider says that it is safe.  Rest as  directed. ? Avoid sitting for a long time without moving. Get up to take short walks every 1-2 hours.  Do not drive for 24 hours if you were given a medicine to help you relax (sedative). General instructions  Take over-the-counter and prescription medicines only as told by your health care provider.  Keep all follow-up visits as told by your health care provider. This is important. Contact a health care provider if you have:  A fever or chills.  You have redness, swelling, or pain around your insertion site. Get help right away if:  The catheter insertion area swells very fast.  You pass out.  You suddenly start to sweat or your skin gets clammy.  The catheter insertion area is bleeding, and the bleeding does not stop when you hold steady pressure on the area.  The area near or just beyond the catheter insertion site becomes pale, cool, tingly, or numb. These symptoms may represent a serious problem that is an emergency. Do not wait to see if the symptoms will go away. Get medical help right away. Call your local emergency services (911 in the [place] Do not drive yourself to the hospital. Summary  After the procedure, it is common to have bruising that usually fades within 1-2 weeks.  Check your femoral site every day for signs of infection.  Do not lift anything that is heavier than 10 lb (4.5 kg), or   the limit that you are told, until your health care provider says that it is safe. This information is not intended to replace advice given to you by your health care provider. Make sure you discuss any questions you have with your health care provider. Document Revised: 03/30/2020 Document Reviewed: 03/30/2020 Elsevier Patient Education  2021 Elsevier Inc.  

## 2020-11-27 NOTE — Progress Notes (Signed)
Site area: left groin fa sheath pulled by Raelyn Number Site Prior to Removal:  Level 0 Pressure Applied For: 20 minutes Manual:   yes Patient Status During Pull:  stable Post Pull Site:  Level 0 Post Pull Instructions Given:  yes Post Pull Pulses Present: left pt dopplered Dressing Applied:  Gauze and tegaderm Bedrest begins @ 7681 Comments:

## 2020-11-28 ENCOUNTER — Encounter (HOSPITAL_COMMUNITY): Payer: Self-pay | Admitting: Surgery

## 2020-11-28 ENCOUNTER — Other Ambulatory Visit: Payer: Self-pay | Admitting: *Deleted

## 2020-11-28 NOTE — Telephone Encounter (Signed)
The patient had bypass surgery in 2016.  He has not had anginal symptoms.  If he is having no angina, they cannot operate until he is further assessed.  Otherwise it is okay to clear the patient to proceed.  Within the past 6 months he has had TCAR performed without difficulty.

## 2020-11-28 NOTE — Telephone Encounter (Signed)
Yes Dr. Tamala Julian, that's how I understood it as well

## 2020-11-28 NOTE — Telephone Encounter (Signed)
Just actually read the note that I sent.  Meant to say that if he is having no angina it is okay to proceed with surgery.  Clearly the Eliquis will need to be held for least 48 hours prior to surgery unless surgery has other ideas.  I do not believe they will operate on him with full anticoagulation.

## 2020-11-28 NOTE — Telephone Encounter (Signed)
   Name: Bradley [name]  DOB: March 23, 1946  MRN: [id number]   Primary Cardiologist: Sinclair Grooms, MD  Chart reviewed as part of pre-operative protocol coverage. Patient was contacted 11/28/2020 in reference to pre-operative risk assessment for pending surgery as outlined below.  Bradley [name] was last seen on 08/15/2020 by Dr. Daneen Schick.  Since that day, Bradley [name] has done well without any chest pain or worsening dyspnea.  His only limitation is his leg due to blood flow issue, otherwise he is still doing every day activity at this time.  Therefore, based on ACC/AHA guidelines, the patient would be at acceptable risk for the planned procedure without further cardiovascular testing.   We typically recommend holding Eliquis prior to this type of surgery, however because all of the lower extremity angiography that was done yesterday, patient says he has been holding Eliquis since 11/23/2020.  Since his vascular bypass surgery is in 2 days, he will continue to hold Eliquis until after his surgery.  We will defer to the surgeon to decide the earliest restarting time of Eliquis after the procedure based on the bleeding risk.  The patient was advised that if he develops new symptoms prior to surgery to contact our office to arrange for a follow-up visit, and he verbalized understanding.  I will route this recommendation to the requesting party via Epic fax function and remove from pre-op pool. Please call with questions.  [place], [place] 11/28/2020, 12:04 PM

## 2020-11-29 ENCOUNTER — Encounter (HOSPITAL_COMMUNITY): Payer: Self-pay | Admitting: Vascular Surgery

## 2020-11-29 ENCOUNTER — Other Ambulatory Visit: Payer: Self-pay

## 2020-11-29 ENCOUNTER — Other Ambulatory Visit
Admission: RE | Admit: 2020-11-29 | Discharge: 2020-11-29 | Disposition: A | Discharge status: Home / Self Care | Payer: PPO | Source: Ambulatory Visit | Attending: Vascular Surgery | Admitting: Vascular Surgery

## 2020-11-29 DIAGNOSIS — Z20822 Contact with and (suspected) exposure to covid-19: Secondary | ICD-10-CM | POA: Diagnosis not present

## 2020-11-29 DIAGNOSIS — Z01812 Encounter for preprocedural laboratory examination: Secondary | ICD-10-CM | POA: Insufficient documentation

## 2020-11-29 MED ORDER — VANCOMYCIN HCL 1500 MG/300ML IV SOLN
1500.0000 mg | INTRAVENOUS | Status: AC
  Administered 2020-11-30: 1500 mg via INTRAVENOUS
  Filled 2020-11-29: qty 300

## 2020-11-29 NOTE — Anesthesia Preprocedure Evaluation (Addendum)
Anesthesia Evaluation  Patient identified by MRN, date of birth, ID band Patient awake    Reviewed: Allergy & Precautions, NPO status , Patient's Chart, lab work & pertinent test results  Airway Mallampati: III  TM Distance: >3 FB Neck ROM: Full    Dental  (+) Teeth Intact   Pulmonary sleep apnea and Continuous Positive Airway Pressure Ventilation , former smoker,    Pulmonary exam normal        Cardiovascular hypertension, Pt. on medications and Pt. on home beta blockers + CAD, + Past MI (2014), + CABG (2016) and + Peripheral Vascular Disease  + dysrhythmias Atrial Fibrillation  Rhythm:Regular Rate:Normal     Neuro/Psych CVA (1987), Residual Symptoms negative psych ROS   GI/Hepatic Neg liver ROS, hiatal hernia, GERD  ,  Endo/Other  diabetes, Type 2, Insulin Dependent  Renal/GU CRFRenal disease  negative genitourinary   Musculoskeletal  (+) Arthritis , Osteoarthritis,    Abdominal (+)  Abdomen: soft. Bowel sounds: normal.  Peds  Hematology  (+) anemia ,   Anesthesia Other Findings   Reproductive/Obstetrics                            Anesthesia Physical Anesthesia Plan  ASA: IV  Anesthesia Plan: General   Post-op Pain Management:    Induction: Intravenous  PONV Risk Score and Plan: 2 and Ondansetron, Dexamethasone and Treatment may vary due to age or medical condition  Airway Management Planned: Mask and Oral ETT  Additional Equipment: Arterial line  Intra-op Plan:   Post-operative Plan: Extubation in OR  Informed Consent: I have reviewed the patients History and Physical, chart, labs and discussed the procedure including the risks, benefits and alternatives for the proposed anesthesia with the patient or authorized representative who has indicated his/her understanding and acceptance.     Dental advisory given  Plan Discussed with: CRNA  Anesthesia Plan Comments: (Lab  Results      Component                Value               Date                      WBC                      9.3                 06/02/2020                HGB                      15.3                11/27/2020                HCT                      45.0                11/27/2020                MCV                      100.2 (H)           06/02/2020  PLT                      186                 06/02/2020           Lab Results      Component                Value               Date                      NA                       140                 11/27/2020                K                        3.9                 11/27/2020                CO2                      25                  06/02/2020                GLUCOSE                  182 (H)             11/27/2020                BUN                      31 (H)              11/27/2020                CREATININE               2.60 (H)            11/27/2020                CALCIUM                  8.4 (L)             06/02/2020                GFRNONAA                 27 (L)              06/02/2020                GFRAA                    41 (L)              02/01/2020            ECHO 2021: 1. Inferior basal hypokinesis . Left ventricular ejection fraction, by  estimation, is 55%. The left ventricle has normal function. The left  ventricle has no regional wall motion abnormalities. Left  ventricular  diastolic parameters are consistent with  Grade I diastolic dysfunction (impaired relaxation).  2. Right ventricular systolic function was not well visualized. The right  ventricular size is not well visualized.  3. The mitral valve is normal in structure. Trivial mitral valve  regurgitation. No evidence of mitral stenosis.  4. The aortic valve was not well visualized. Aortic valve regurgitation  is not visualized. Moderate sclerosis and calcification without stenosis.  5. The inferior vena cava is normal in size with greater  than 50%  respiratory variability, suggesting right atrial pressure of 3 mmHg.)       Anesthesia Quick Evaluation

## 2020-11-29 NOTE — Progress Notes (Signed)
Bradley [name] denies chest pain or shortness of breath. Patient was tested for Covid and has been in quarantine since that time.  Bradley [name] has type II diabetes , patient reports that CBG run 70 - 220. I instructed Bradley [name]  To trake 12 units of [name] in am if CBG . 70. I instructed patient to check CBG after awaking and every 2 hours until arrival  to the hospital.  I Instructed patient if CBG is less than 70 to take 4 Glucose Tablets . Recheck CBG in 15 minutes if CBG is not over 70 call, pre- op desk at [phone] for further instructions. If scheduled to receive Insulin, do not take Insulin

## 2020-11-30 ENCOUNTER — Encounter (HOSPITAL_COMMUNITY): Payer: Self-pay | Admitting: Vascular Surgery

## 2020-11-30 ENCOUNTER — Inpatient Hospital Stay (HOSPITAL_COMMUNITY): Payer: PPO | Admitting: Anesthesiology

## 2020-11-30 ENCOUNTER — Encounter (HOSPITAL_COMMUNITY)
Admission: RE | Disposition: A | Discharge status: Home / Self Care | Payer: Self-pay | Source: Home / Self Care | Attending: Vascular Surgery

## 2020-11-30 ENCOUNTER — Inpatient Hospital Stay (HOSPITAL_COMMUNITY)
Admission: RE | Admit: 2020-11-30 | Discharge: 2020-12-02 | DRG: 253 | Disposition: A | Discharge status: Home / Self Care | Payer: PPO | Attending: Vascular Surgery | Admitting: Vascular Surgery

## 2020-11-30 DIAGNOSIS — Z888 Allergy status to other drugs, medicaments and biological substances status: Secondary | ICD-10-CM | POA: Diagnosis not present

## 2020-11-30 DIAGNOSIS — T82858A Stenosis of vascular prosthetic devices, implants and grafts, initial encounter: Secondary | ICD-10-CM | POA: Diagnosis present

## 2020-11-30 DIAGNOSIS — Z880 Allergy status to penicillin: Secondary | ICD-10-CM

## 2020-11-30 DIAGNOSIS — Z8673 Personal history of transient ischemic attack (TIA), and cerebral infarction without residual deficits: Secondary | ICD-10-CM | POA: Diagnosis not present

## 2020-11-30 DIAGNOSIS — Z882 Allergy status to sulfonamides status: Secondary | ICD-10-CM

## 2020-11-30 DIAGNOSIS — I70221 Atherosclerosis of native arteries of extremities with rest pain, right leg: Secondary | ICD-10-CM | POA: Diagnosis present

## 2020-11-30 DIAGNOSIS — E1122 Type 2 diabetes mellitus with diabetic chronic kidney disease: Secondary | ICD-10-CM | POA: Diagnosis present

## 2020-11-30 DIAGNOSIS — I739 Peripheral vascular disease, unspecified: Secondary | ICD-10-CM | POA: Diagnosis present

## 2020-11-30 DIAGNOSIS — I2510 Atherosclerotic heart disease of native coronary artery without angina pectoris: Secondary | ICD-10-CM | POA: Diagnosis present

## 2020-11-30 DIAGNOSIS — Z79899 Other long term (current) drug therapy: Secondary | ICD-10-CM

## 2020-11-30 DIAGNOSIS — Z794 Long term (current) use of insulin: Secondary | ICD-10-CM

## 2020-11-30 DIAGNOSIS — M1990 Unspecified osteoarthritis, unspecified site: Secondary | ICD-10-CM | POA: Diagnosis present

## 2020-11-30 DIAGNOSIS — I25810 Atherosclerosis of coronary artery bypass graft(s) without angina pectoris: Secondary | ICD-10-CM | POA: Diagnosis present

## 2020-11-30 DIAGNOSIS — Z7982 Long term (current) use of aspirin: Secondary | ICD-10-CM | POA: Diagnosis not present

## 2020-11-30 DIAGNOSIS — I480 Paroxysmal atrial fibrillation: Secondary | ICD-10-CM | POA: Diagnosis present

## 2020-11-30 DIAGNOSIS — Y713 Surgical instruments, materials and cardiovascular devices (including sutures) associated with adverse incidents: Secondary | ICD-10-CM | POA: Diagnosis present

## 2020-11-30 DIAGNOSIS — G4733 Obstructive sleep apnea (adult) (pediatric): Secondary | ICD-10-CM | POA: Diagnosis present

## 2020-11-30 DIAGNOSIS — E785 Hyperlipidemia, unspecified: Secondary | ICD-10-CM | POA: Diagnosis present

## 2020-11-30 DIAGNOSIS — E1151 Type 2 diabetes mellitus with diabetic peripheral angiopathy without gangrene: Secondary | ICD-10-CM | POA: Diagnosis present

## 2020-11-30 DIAGNOSIS — N183 Chronic kidney disease, stage 3 unspecified: Secondary | ICD-10-CM | POA: Diagnosis present

## 2020-11-30 DIAGNOSIS — Z885 Allergy status to narcotic agent status: Secondary | ICD-10-CM

## 2020-11-30 DIAGNOSIS — Z7901 Long term (current) use of anticoagulants: Secondary | ICD-10-CM

## 2020-11-30 DIAGNOSIS — I252 Old myocardial infarction: Secondary | ICD-10-CM

## 2020-11-30 DIAGNOSIS — I70213 Atherosclerosis of native arteries of extremities with intermittent claudication, bilateral legs: Secondary | ICD-10-CM | POA: Diagnosis not present

## 2020-11-30 HISTORY — PX: FEMORAL-POPLITEAL BYPASS GRAFT: SHX937

## 2020-11-30 HISTORY — PX: ENDARTERECTOMY FEMORAL: SHX5804

## 2020-11-30 LAB — COMPREHENSIVE METABOLIC PANEL
ALT: 22 U/L (ref 0–44)
AST: 24 U/L (ref 15–41)
Albumin: 3.6 g/dL (ref 3.5–5.0)
Alkaline Phosphatase: 51 U/L (ref 38–126)
Anion gap: 12 (ref 5–15)
BUN: 29 mg/dL — ABNORMAL HIGH (ref 8–23)
CO2: 25 mmol/L (ref 22–32)
Calcium: 8.5 mg/dL — ABNORMAL LOW (ref 8.9–10.3)
Chloride: 100 mmol/L (ref 98–111)
Creatinine, Ser: 2.57 mg/dL — ABNORMAL HIGH (ref 0.61–1.24)
GFR, Estimated: 25 mL/min — ABNORMAL LOW (ref >60)
Glucose, Bld: 195 mg/dL — ABNORMAL HIGH (ref 70–99)
Potassium: 3.7 mmol/L (ref 3.5–5.1)
Sodium: 137 mmol/L (ref 135–145)
Total Bilirubin: 0.7 mg/dL (ref 0.3–1.2)
Total Protein: 6.5 g/dL (ref 6.5–8.1)

## 2020-11-30 LAB — CBC
HCT: 42.7 % (ref 39.0–52.0)
HCT: 37.4 % — ABNORMAL LOW (ref 39.0–52.0)
Hemoglobin: 13.3 g/dL (ref 13.0–17.0)
Hemoglobin: 11.9 g/dL — ABNORMAL LOW (ref 13.0–17.0)
MCH: 30.7 pg (ref 26.0–34.0)
MCH: 30.9 pg (ref 26.0–34.0)
MCHC: 31.1 g/dL (ref 30.0–36.0)
MCHC: 31.8 g/dL (ref 30.0–36.0)
MCV: 98.6 fL (ref 80.0–100.0)
MCV: 97.1 fL (ref 80.0–100.0)
Platelets: 222 10*3/uL (ref 150–400)
Platelets: 170 10*3/uL (ref 150–400)
RBC: 4.33 MIL/uL (ref 4.22–5.81)
RBC: 3.85 MIL/uL — ABNORMAL LOW (ref 4.22–5.81)
RDW: 13.5 % (ref 11.5–15.5)
RDW: 13.6 % (ref 11.5–15.5)
WBC: 7.4 10*3/uL (ref 4.0–10.5)
WBC: 11 10*3/uL — ABNORMAL HIGH (ref 4.0–10.5)
nRBC: 0 % (ref 0.0–0.2)
nRBC: 0 % (ref 0.0–0.2)

## 2020-11-30 LAB — GLUCOSE, CAPILLARY
Glucose-Capillary: 182 mg/dL — ABNORMAL HIGH (ref 70–99)
Glucose-Capillary: 122 mg/dL — ABNORMAL HIGH (ref 70–99)
Glucose-Capillary: 153 mg/dL — ABNORMAL HIGH (ref 70–99)
Glucose-Capillary: 228 mg/dL — ABNORMAL HIGH (ref 70–99)

## 2020-11-30 LAB — SURGICAL PCR SCREEN
MRSA, PCR: NEGATIVE
Staphylococcus aureus: NEGATIVE

## 2020-11-30 LAB — CREATININE, SERUM
Creatinine, Ser: 2.6 mg/dL — ABNORMAL HIGH (ref 0.61–1.24)
GFR, Estimated: 25 mL/min — ABNORMAL LOW (ref >60)

## 2020-11-30 LAB — TYPE AND SCREEN
ABO/RH(D): A NEG
Antibody Screen: NEGATIVE

## 2020-11-30 LAB — HEMOGLOBIN A1C
Hgb A1c MFr Bld: 7.5 % — ABNORMAL HIGH (ref 4.8–5.6)
Mean Plasma Glucose: 168.55 mg/dL

## 2020-11-30 LAB — APTT: aPTT: 24 seconds (ref 24–36)

## 2020-11-30 LAB — SARS CORONAVIRUS 2 (TAT 6-24 HRS): SARS Coronavirus 2: NEGATIVE

## 2020-11-30 LAB — PROTIME-INR
INR: 1 (ref 0.8–1.2)
Prothrombin Time: 12.7 seconds (ref 11.4–15.2)

## 2020-11-30 SURGERY — ENDARTERECTOMY, FEMORAL
Anesthesia: General | Site: Leg Upper | Laterality: Right

## 2020-11-30 MED ORDER — MAGNESIUM SULFATE 2 GM/50ML IV SOLN: 2.0000 g | Freq: Every day | INTRAVENOUS | Status: DC | PRN

## 2020-11-30 MED ORDER — CHLORHEXIDINE GLUCONATE CLOTH 2 % EX PADS: 6.0000 | MEDICATED_PAD | Freq: Once | CUTANEOUS | Status: DC

## 2020-11-30 MED ORDER — NITROGLYCERIN 0.4 MG SL SUBL: 0.4000 mg | SUBLINGUAL_TABLET | SUBLINGUAL | Status: DC | PRN

## 2020-11-30 MED ORDER — PROTAMINE SULFATE 10 MG/ML IV SOLN
INTRAVENOUS | Status: DC | PRN
  Administered 2020-11-30: 50 mg via INTRAVENOUS

## 2020-11-30 MED ORDER — ONDANSETRON HCL 4 MG/2ML IJ SOLN
INTRAMUSCULAR | Status: DC | PRN
  Administered 2020-11-30: 4 mg via INTRAVENOUS

## 2020-11-30 MED ORDER — PROPOFOL 10 MG/ML IV BOLUS
INTRAVENOUS | Status: AC
  Filled 2020-11-30: qty 20

## 2020-11-30 MED ORDER — LACTATED RINGERS IV SOLN: INTRAVENOUS | Status: DC | PRN

## 2020-11-30 MED ORDER — TRAMADOL HCL 50 MG PO TABS: 50.0000 mg | ORAL_TABLET | Freq: Two times a day (BID) | ORAL | Status: DC | PRN

## 2020-11-30 MED ORDER — DOCUSATE SODIUM 100 MG PO CAPS
100.0000 mg | ORAL_CAPSULE | Freq: Every day | ORAL | Status: DC
  Administered 2020-12-01 – 2020-12-02 (×2): 100 mg via ORAL
  Filled 2020-11-30 (×2): qty 1

## 2020-11-30 MED ORDER — BISACODYL 10 MG RE SUPP: 10.0000 mg | Freq: Every day | RECTAL | Status: DC | PRN

## 2020-11-30 MED ORDER — ROCURONIUM BROMIDE 10 MG/ML (PF) SYRINGE
PREFILLED_SYRINGE | INTRAVENOUS | Status: AC
  Filled 2020-11-30: qty 10

## 2020-11-30 MED ORDER — METHOCARBAMOL 500 MG PO TABS
500.0000 mg | ORAL_TABLET | Freq: Four times a day (QID) | ORAL | Status: DC | PRN
  Administered 2020-12-01: 500 mg via ORAL
  Filled 2020-11-30: qty 1

## 2020-11-30 MED ORDER — MIDAZOLAM HCL 2 MG/2ML IJ SOLN
INTRAMUSCULAR | Status: DC | PRN
  Administered 2020-11-30 (×2): 1 mg via INTRAVENOUS

## 2020-11-30 MED ORDER — ACETAMINOPHEN 325 MG PO TABS: 325.0000 mg | ORAL_TABLET | ORAL | Status: DC | PRN

## 2020-11-30 MED ORDER — ACETAMINOPHEN 10 MG/ML IV SOLN
INTRAVENOUS | Status: AC
  Filled 2020-11-30: qty 100

## 2020-11-30 MED ORDER — LABETALOL HCL 5 MG/ML IV SOLN: 10.0000 mg | INTRAVENOUS | Status: DC | PRN

## 2020-11-30 MED ORDER — GABAPENTIN 300 MG PO CAPS
600.0000 mg | ORAL_CAPSULE | Freq: Every day | ORAL | Status: DC
  Administered 2020-11-30 – 2020-12-01 (×2): 600 mg via ORAL
  Filled 2020-11-30 (×2): qty 2

## 2020-11-30 MED ORDER — AMLODIPINE BESYLATE 10 MG PO TABS
10.0000 mg | ORAL_TABLET | Freq: Every day | ORAL | Status: DC
  Administered 2020-12-01 – 2020-12-02 (×2): 10 mg via ORAL
  Filled 2020-11-30 (×2): qty 1

## 2020-11-30 MED ORDER — FENTANYL CITRATE (PF) 250 MCG/5ML IJ SOLN
INTRAMUSCULAR | Status: DC | PRN
  Administered 2020-11-30: 150 ug via INTRAVENOUS

## 2020-11-30 MED ORDER — ASPIRIN EC 81 MG PO TBEC
81.0000 mg | DELAYED_RELEASE_TABLET | Freq: Every day | ORAL | Status: DC
  Administered 2020-12-01 – 2020-12-02 (×2): 81 mg via ORAL
  Filled 2020-11-30 (×2): qty 1

## 2020-11-30 MED ORDER — FENTANYL CITRATE (PF) 100 MCG/2ML IJ SOLN: 25.0000 ug | INTRAMUSCULAR | Status: DC | PRN

## 2020-11-30 MED ORDER — METOPROLOL TARTRATE 5 MG/5ML IV SOLN: 2.0000 mg | INTRAVENOUS | Status: DC | PRN

## 2020-11-30 MED ORDER — HYDRALAZINE HCL 25 MG PO TABS
25.0000 mg | ORAL_TABLET | Freq: Two times a day (BID) | ORAL | Status: DC
  Administered 2020-11-30 – 2020-12-02 (×5): 25 mg via ORAL
  Filled 2020-11-30 (×5): qty 1

## 2020-11-30 MED ORDER — PROPOFOL 10 MG/ML IV BOLUS
INTRAVENOUS | Status: DC | PRN
  Administered 2020-11-30: 120 mg via INTRAVENOUS

## 2020-11-30 MED ORDER — ONDANSETRON HCL 4 MG/2ML IJ SOLN
4.0000 mg | Freq: Once | INTRAMUSCULAR | Status: DC | PRN
Start: 2020-11-30 — End: 2020-11-30

## 2020-11-30 MED ORDER — HEPARIN SODIUM (PORCINE) 5000 UNIT/ML IJ SOLN: 5000.0000 [IU] | Freq: Three times a day (TID) | INTRAMUSCULAR | Status: DC

## 2020-11-30 MED ORDER — TORSEMIDE 20 MG PO TABS
20.0000 mg | ORAL_TABLET | Freq: Every day | ORAL | Status: DC
  Administered 2020-12-01 – 2020-12-02 (×2): 20 mg via ORAL
  Filled 2020-11-30 (×2): qty 1

## 2020-11-30 MED ORDER — PHENYLEPHRINE 40 MCG/ML (10ML) SYRINGE FOR IV PUSH (FOR BLOOD PRESSURE SUPPORT)
PREFILLED_SYRINGE | INTRAVENOUS | Status: DC | PRN
  Administered 2020-11-30: 80 ug via INTRAVENOUS

## 2020-11-30 MED ORDER — VANCOMYCIN HCL 1000 MG/200ML IV SOLN
1000.0000 mg | Freq: Two times a day (BID) | INTRAVENOUS | Status: AC
  Administered 2020-11-30 – 2020-12-01 (×2): 1000 mg via INTRAVENOUS
  Filled 2020-11-30 (×2): qty 200

## 2020-11-30 MED ORDER — ACETAMINOPHEN 10 MG/ML IV SOLN
1000.0000 mg | Freq: Once | INTRAVENOUS | Status: DC | PRN
  Administered 2020-11-30: 1000 mg via INTRAVENOUS

## 2020-11-30 MED ORDER — HYDROCORTISONE (PERIANAL) 2.5 % EX CREA
1.0000 "application " | TOPICAL_CREAM | Freq: Every day | CUTANEOUS | Status: DC | PRN
  Filled 2020-11-30: qty 28.35

## 2020-11-30 MED ORDER — SODIUM CHLORIDE 0.9 % IV SOLN: INTRAVENOUS | Status: DC

## 2020-11-30 MED ORDER — LIDOCAINE 2% (20 MG/ML) 5 ML SYRINGE
INTRAMUSCULAR | Status: AC
  Filled 2020-11-30: qty 5

## 2020-11-30 MED ORDER — ATORVASTATIN CALCIUM 40 MG PO TABS
40.0000 mg | ORAL_TABLET | Freq: Every day | ORAL | Status: DC
  Administered 2020-12-01 – 2020-12-02 (×2): 40 mg via ORAL
  Filled 2020-11-30 (×2): qty 1

## 2020-11-30 MED ORDER — SODIUM CHLORIDE 0.9 % IV SOLN: 500.0000 mL | Freq: Once | INTRAVENOUS | Status: DC | PRN

## 2020-11-30 MED ORDER — SODIUM CHLORIDE 0.9 % IV SOLN
INTRAVENOUS | Status: DC | PRN
  Administered 2020-11-30: 500 mL

## 2020-11-30 MED ORDER — 0.9 % SODIUM CHLORIDE (POUR BTL) OPTIME
TOPICAL | Status: DC | PRN
  Administered 2020-11-30: 2000 mL

## 2020-11-30 MED ORDER — POTASSIUM CHLORIDE CRYS ER 20 MEQ PO TBCR: 20.0000 meq | EXTENDED_RELEASE_TABLET | Freq: Every day | ORAL | Status: DC | PRN

## 2020-11-30 MED ORDER — HEPARIN SODIUM (PORCINE) 1000 UNIT/ML IJ SOLN
INTRAMUSCULAR | Status: DC | PRN
  Administered 2020-11-30: 10000 [IU] via INTRAVENOUS

## 2020-11-30 MED ORDER — GUAIFENESIN-DM 100-10 MG/5ML PO SYRP: 15.0000 mL | ORAL_SOLUTION | ORAL | Status: DC | PRN

## 2020-11-30 MED ORDER — PROTAMINE SULFATE 10 MG/ML IV SOLN
INTRAVENOUS | Status: AC
  Filled 2020-11-30: qty 5

## 2020-11-30 MED ORDER — SUGAMMADEX SODIUM 200 MG/2ML IV SOLN
INTRAVENOUS | Status: DC | PRN
  Administered 2020-11-30: 200 mg via INTRAVENOUS

## 2020-11-30 MED ORDER — ROCURONIUM BROMIDE 10 MG/ML (PF) SYRINGE
PREFILLED_SYRINGE | INTRAVENOUS | Status: DC | PRN
  Administered 2020-11-30: 30 mg via INTRAVENOUS
  Administered 2020-11-30: 70 mg via INTRAVENOUS

## 2020-11-30 MED ORDER — METOPROLOL TARTRATE 50 MG PO TABS
75.0000 mg | ORAL_TABLET | Freq: Two times a day (BID) | ORAL | Status: DC
  Administered 2020-11-30 – 2020-12-02 (×4): 75 mg via ORAL
  Filled 2020-11-30 (×4): qty 1

## 2020-11-30 MED ORDER — PHENYLEPHRINE HCL-NACL 10-0.9 MG/250ML-% IV SOLN
INTRAVENOUS | Status: DC | PRN
  Administered 2020-11-30: 50 ug/min via INTRAVENOUS

## 2020-11-30 MED ORDER — FENTANYL CITRATE (PF) 250 MCG/5ML IJ SOLN
INTRAMUSCULAR | Status: AC
  Filled 2020-11-30: qty 5

## 2020-11-30 MED ORDER — PHENOL 1.4 % MT LIQD: 1.0000 | OROMUCOSAL | Status: DC | PRN

## 2020-11-30 MED ORDER — HYDROMORPHONE HCL 1 MG/ML IJ SOLN
0.5000 mg | INTRAMUSCULAR | Status: DC | PRN
Start: 2020-11-30 — End: 2020-12-02
  Administered 2020-11-30 – 2020-12-01 (×3): 0.5 mg via INTRAVENOUS
  Filled 2020-11-30 (×3): qty 1

## 2020-11-30 MED ORDER — MIDAZOLAM HCL 2 MG/2ML IJ SOLN
INTRAMUSCULAR | Status: AC
  Filled 2020-11-30: qty 2

## 2020-11-30 MED ORDER — ONDANSETRON HCL 4 MG/2ML IJ SOLN
INTRAMUSCULAR | Status: AC
  Filled 2020-11-30: qty 2

## 2020-11-30 MED ORDER — POLYETHYLENE GLYCOL 3350 17 G PO PACK: 17.0000 g | PACK | Freq: Every day | ORAL | Status: DC | PRN

## 2020-11-30 MED ORDER — DEXAMETHASONE SODIUM PHOSPHATE 10 MG/ML IJ SOLN
INTRAMUSCULAR | Status: AC
  Filled 2020-11-30: qty 1

## 2020-11-30 MED ORDER — INSULIN GLARGINE 100 UNIT/ML ~~LOC~~ SOLN
25.0000 [IU] | Freq: Every day | SUBCUTANEOUS | Status: DC
  Administered 2020-12-01 – 2020-12-02 (×2): 25 [IU] via SUBCUTANEOUS
  Filled 2020-11-30 (×2): qty 0.25

## 2020-11-30 MED ORDER — ACETAMINOPHEN 650 MG RE SUPP: 325.0000 mg | RECTAL | Status: DC | PRN

## 2020-11-30 MED ORDER — SODIUM CHLORIDE 0.9 % IV SOLN
INTRAVENOUS | Status: AC
  Filled 2020-11-30: qty 1.2

## 2020-11-30 MED ORDER — ONDANSETRON HCL 4 MG/2ML IJ SOLN: 4.0000 mg | Freq: Four times a day (QID) | INTRAMUSCULAR | Status: DC | PRN

## 2020-11-30 MED ORDER — HYDROCORTISONE 1 % EX CREA
1.0000 "application " | TOPICAL_CREAM | Freq: Two times a day (BID) | CUTANEOUS | Status: DC | PRN
  Filled 2020-11-30: qty 28

## 2020-11-30 MED ORDER — PHENYLEPHRINE 40 MCG/ML (10ML) SYRINGE FOR IV PUSH (FOR BLOOD PRESSURE SUPPORT)
PREFILLED_SYRINGE | INTRAVENOUS | Status: AC
  Filled 2020-11-30: qty 10

## 2020-11-30 MED ORDER — HYDRALAZINE HCL 20 MG/ML IJ SOLN: 5.0000 mg | INTRAMUSCULAR | Status: DC | PRN

## 2020-11-30 MED ORDER — INSULIN ASPART 100 UNIT/ML ~~LOC~~ SOLN
0.0000 [IU] | Freq: Three times a day (TID) | SUBCUTANEOUS | Status: DC
  Administered 2020-11-30: 2 [IU] via SUBCUTANEOUS
  Administered 2020-12-01: 1 [IU] via SUBCUTANEOUS
  Administered 2020-12-01: 3 [IU] via SUBCUTANEOUS
  Administered 2020-12-01 – 2020-12-02 (×2): 2 [IU] via SUBCUTANEOUS

## 2020-11-30 MED ORDER — HEPARIN SODIUM (PORCINE) 1000 UNIT/ML IJ SOLN
INTRAMUSCULAR | Status: AC
  Filled 2020-11-30: qty 1

## 2020-11-30 MED ORDER — LIDOCAINE 2% (20 MG/ML) 5 ML SYRINGE
INTRAMUSCULAR | Status: DC | PRN
  Administered 2020-11-30: 100 mg via INTRAVENOUS

## 2020-11-30 MED ORDER — PANTOPRAZOLE SODIUM 40 MG PO TBEC
40.0000 mg | DELAYED_RELEASE_TABLET | Freq: Every day | ORAL | Status: DC
  Administered 2020-12-01 – 2020-12-02 (×2): 40 mg via ORAL
  Filled 2020-11-30 (×2): qty 1

## 2020-11-30 MED ORDER — CHLORHEXIDINE GLUCONATE 0.12 % MT SOLN
OROMUCOSAL | Status: AC
  Administered 2020-11-30: 15 mL
  Filled 2020-11-30: qty 15

## 2020-11-30 MED ORDER — MELATONIN 3 MG PO TABS
3.0000 mg | ORAL_TABLET | Freq: Every day | ORAL | Status: DC
  Administered 2020-11-30 – 2020-12-01 (×2): 3 mg via ORAL
  Filled 2020-11-30 (×2): qty 1

## 2020-11-30 SURGICAL SUPPLY — 44 items
BANDAGE ESMARK 6X9 LF (GAUZE/BANDAGES/DRESSINGS) IMPLANT
BNDG ESMARK 6X9 LF (GAUZE/BANDAGES/DRESSINGS)
CANISTER SUCT 3000ML PPV (MISCELLANEOUS) ×2 IMPLANT
CANNULA VESSEL 3MM 2 BLNT TIP (CANNULA) ×4 IMPLANT
CLIP LIGATING EXTRA MED SLVR (CLIP) ×2 IMPLANT
CLIP LIGATING EXTRA SM BLUE (MISCELLANEOUS) ×2 IMPLANT
COVER SURGICAL LIGHT HANDLE (MISCELLANEOUS) ×2 IMPLANT
CUFF TOURN SGL QUICK 34 (TOURNIQUET CUFF)
CUFF TOURN SGL QUICK 42 (TOURNIQUET CUFF) IMPLANT
CUFF TRNQT CYL 34X4.125X (TOURNIQUET CUFF) IMPLANT
DERMABOND ADVANCED (GAUZE/BANDAGES/DRESSINGS) ×1
DERMABOND ADVANCED .7 DNX12 (GAUZE/BANDAGES/DRESSINGS) ×1 IMPLANT
DRAIN SNY 10X20 3/4 PERF (WOUND CARE) IMPLANT
DRAPE HALF SHEET 40X57 (DRAPES) IMPLANT
DRAPE X-RAY CASS 24X20 (DRAPES) IMPLANT
ELECT REM PT RETURN 9FT ADLT (ELECTROSURGICAL) ×2
ELECTRODE REM PT RTRN 9FT ADLT (ELECTROSURGICAL) ×1 IMPLANT
EVACUATOR SILICONE 100CC (DRAIN) IMPLANT
GOWN STRL REUS W/ TWL LRG LVL3 (GOWN DISPOSABLE) ×3 IMPLANT
GOWN STRL REUS W/TWL LRG LVL3 (GOWN DISPOSABLE) ×6
GRAFT PROPATEN W/RING 6X80X60 (Vascular Products) ×2 IMPLANT
INSERT FOGARTY SM (MISCELLANEOUS) ×2 IMPLANT
KIT BASIN OR (CUSTOM PROCEDURE TRAY) ×2 IMPLANT
KIT TURNOVER KIT B (KITS) ×2 IMPLANT
NS IRRIG 1000ML POUR BTL (IV SOLUTION) ×4 IMPLANT
PACK PERIPHERAL VASCULAR (CUSTOM PROCEDURE TRAY) ×2 IMPLANT
PAD ARMBOARD 7.5X6 YLW CONV (MISCELLANEOUS) ×4 IMPLANT
PADDING CAST COTTON 6X4 STRL (CAST SUPPLIES) IMPLANT
PATCH HEMASHIELD 8X75 (Vascular Products) ×2 IMPLANT
SET COLLECT BLD 21X3/4 12 (NEEDLE) IMPLANT
STOPCOCK 4 WAY LG BORE MALE ST (IV SETS) IMPLANT
SUT ETHILON 3 0 PS 1 (SUTURE) IMPLANT
SUT MNCRL AB 4-0 PS2 18 (SUTURE) ×2 IMPLANT
SUT PROLENE 5 0 C 1 24 (SUTURE) ×2 IMPLANT
SUT PROLENE 6 0 CC (SUTURE) ×12 IMPLANT
SUT SILK 2 0 SH (SUTURE) ×2 IMPLANT
SUT VIC AB 2-0 CTX 36 (SUTURE) ×4 IMPLANT
SUT VIC AB 3-0 SH 27 (SUTURE) ×4
SUT VIC AB 3-0 SH 27X BRD (SUTURE) ×2 IMPLANT
TOWEL GREEN STERILE (TOWEL DISPOSABLE) ×2 IMPLANT
TRAY FOLEY MTR SLVR 16FR STAT (SET/KITS/TRAYS/PACK) ×2 IMPLANT
TUBING EXTENTION W/L.L. (IV SETS) IMPLANT
UNDERPAD 30X36 HEAVY ABSORB (UNDERPADS AND DIAPERS) ×2 IMPLANT
WATER STERILE IRR 1000ML POUR (IV SOLUTION) ×2 IMPLANT

## 2020-11-30 NOTE — Anesthesia Postprocedure Evaluation (Signed)
Anesthesia Post Note  Patient: Bradley [name]  Procedure(s) Performed: ENDARTERECTOMY FEMORAL RIGHT (Right Leg Upper) BYPASS GRAFT FEMORAL-POPLITEAL ARTERY RIGHT (Right Leg Upper)     Patient location during evaluation: PACU Anesthesia Type: General Level of consciousness: awake and alert Pain management: pain level controlled Vital Signs Assessment: post-procedure vital signs reviewed and stable Respiratory status: spontaneous breathing, nonlabored ventilation, respiratory function stable and patient connected to nasal cannula oxygen Cardiovascular status: blood pressure returned to baseline and stable Postop Assessment: no apparent nausea or vomiting Anesthetic complications: no   No complications documented.  Last Vitals:  Vitals:   11/30/20 1253 11/30/20 1320  BP: 124/67 120/71  Pulse: 76   Resp: 15   Temp: 36.4 C 36.5 C  SpO2: 95% 93%    Last Pain:  Vitals:   11/30/20 1339  TempSrc:   PainSc: 0-No pain                 Belenda Cruise P Nathaneal Sommers

## 2020-11-30 NOTE — Discharge Instructions (Signed)
 [facility]  Discharge instructions  Lower Extremity Bypass Surgery  Please refer to the following instruction for your post-procedure care. Your surgeon or physician assistant will discuss any changes with you.  Activity  You are encouraged to walk as much as you can. You can slowly return to normal activities during the month after your surgery. Avoid strenuous activity and heavy lifting until your doctor tells you it's OK. Avoid activities such as vacuuming or swinging a golf club. Do not drive until your doctor give the OK and you are no longer taking prescription pain medications. It is also normal to have difficulty with sleep habits, eating and bowel movement after surgery. These will go away with time.  Bathing/Showering  Shower daily after you go home. Do not soak in a bathtub, hot tub, or swim until the incision heals completely.  Incision Care  Clean your incision with mild soap and water. Shower every day. Pat the area dry with a clean towel. You do not need a bandage unless otherwise instructed. Do not apply any ointments or creams to your incision. If you have open wounds you will be instructed how to care for them or a visiting nurse may be arranged for you. If you have staples or sutures along your incision they will be removed at your post-op appointment. You may have skin glue on your incision. Do not peel it off. It will come off on its own in about one week.  Wash the groin wound with soap and water daily and pat dry. (No tub bath-only shower)  Then put a dry gauze or washcloth in the groin to keep this area dry to help prevent wound infection.  Do this daily and as needed.  Do not use Vaseline or neosporin on your incisions.  Only use soap and water on your incisions and then protect and keep dry.  Diet  Resume your normal diet. There are no special food restrictions following this procedure. A low fat/ low cholesterol diet is  recommended for all patients with vascular disease. In order to heal from your surgery, it is CRITICAL to get adequate nutrition. Your body requires vitamins, minerals, and protein. Vegetables are the best source of vitamins and minerals. Vegetables also provide the perfect balance of protein. Processed food has little nutritional value, so try to avoid this.  Medications  Resume taking all your medications unless your doctor or physician assistant tells you not to. If your incision is causing pain, you may take over-the-counter pain relievers such as acetaminophen (Tylenol). If you were prescribed a stronger pain medication, please aware these medication can cause nausea and constipation. Prevent nausea by taking the medication with a snack or meal. Avoid constipation by drinking plenty of fluids and eating foods with high amount of fiber, such as fruits, vegetables, and grains. Take Colace 100 mg (an over-the-counter stool softener) twice a day as needed for constipation.  Do not take Tylenol if you are taking prescription pain medications.  Follow Up  Our office will schedule a follow up appointment 2-3 weeks following discharge.  Please call us immediately for any of the following conditions  Severe or worsening pain in your legs or feet while at rest or while walking Increase pain, redness, warmth, or drainage (pus) from your incision site(s) Fever of 101 degree or higher The swelling in your leg with the bypass suddenly worsens and becomes more painful than when you were in the hospital If you have   been instructed to feel your graft pulse then you should do so every day. If you can no longer feel this pulse, call the office immediately. Not all patients are given this instruction.  Leg swelling is common after leg bypass surgery.  The swelling should improve over a few months following surgery. To improve the swelling, you may elevate your legs above the level of your heart while you are  sitting or resting. Your surgeon or physician assistant may ask you to apply an ACE wrap or wear compression (TED) stockings to help to reduce swelling.  Reduce your risk of vascular disease  Stop smoking. If you would like help call [facility] at 1-800-QUIT-NOW (1-800-784-8669) or [place] at 336-586-4000.  Manage your cholesterol Maintain a desired weight Control your diabetes weight Control your diabetes Keep your blood pressure down  If you have any questions, please call the office at [phone]  

## 2020-11-30 NOTE — Op Note (Signed)
OPERATIVE REPORT  DATE OF SURGERY: 11/30/2020  PATIENT: Bradley [name], 75 y.o. male MRN: [id number]  DOB: October 04, 1945  PRE-OPERATIVE DIAGNOSIS: Right leg lower extremity arterial insufficiency related to high-grade stenosis of femoral anastomosis of prior aortofemoral bypass and superficial femoral artery occlusion  POST-OPERATIVE DIAGNOSIS:  Same  PROCEDURE: #1 right femoral endarterectomy and Dacron patch angioplasty onto the profundus femoris artery.  #2 right femoral to above-knee popliteal bypass with 6 mm Gore-Tex graft  SURGEON:  Curt Jews, M.D.  PHYSICIAN ASSISTANT: Rhyne PAC  The assistant was needed for exposure and to expedite the case  ANESTHESIA: General  EBL: per anesthesia record  Total I/O In: 1400 [I.V.:1400] Out: 678 [Urine:620; Blood:250]  BLOOD ADMINISTERED: none  DRAINS: none  SPECIMEN: none  COUNTS CORRECT:  YES  PATIENT DISPOSITION:  PACU - hemodynamically stable  PROCEDURE DETAILS: The patient was taken operating placed supine position where the area of the right groin right leg were prepped draped you sterile fashion.  Incision was made over the prior femoral scar and carried down to isolate the old limb of the right limb of the aortofemoral bypass.  The patient had extensive calcification of his native artery.  His preoperative arteriogram that showed complete occlusion of the superficial femoral at its origin.  Patient had a large profundus femoris artery but this had a high-grade stenosis at the origin.  The profundus femoris was isolated with Vesseloops distal to the origin as was the superficial femoral artery.  SonoSite ultrasound was used to visualize the vein.  The patient had vein harvest from the knee proximally for his coronary bypass and did not have usable vein.  Fortunately he did have above-knee target for bypass.  Incision was made over the medial aspect of the thigh just above the knee and carried down to isolate the superficial  femoral artery.  The artery was of normal caliber and had mild atherosclerotic change.  A tunnel was created from the above-knee popliteal artery to the groin.  The patient was given 10,000 units of intravenous heparin.  After adequate circulation time the right limb of the aortofemoral graft was occluded as was the external iliac artery.  The profundus femoris branches were occluded.  The superficial femoral artery was chronically occluded and was ligated and transected.  The hood of the graft was opened and was extended down to the profunda.  There was extensive coral reef plaque at the origin.  The arteriotomy was continued onto the profundus femoris artery and the artery was endarterectomized.  Distal to this there was a wide patency.  The distal plaque was tacked with several interrupted 6-0 Prolene sutures.  A Finesse Hemashield Dacron patch was brought onto the field and was sewn as a patch angioplasty beginning on the hood of the old right limb of the aortofemoral graft and extending down onto the profunda femoris artery.  This anastomosis was tested and then the vessels were reoccluded.  A incision was made in the Dacron patch and a 6 mm propatent ringed graft was brought onto the field.  The Gore-Tex graft was slightly spatulated and was sewn into side to the Dacron patch with a running 6 suture.  This anastomosis was tested and found to be adequate.  The graft was then brought through the prior created tunnel down to the level of the above-knee popliteal artery.  The artery was occluded proximally and distally with Vesseloops.  The artery was opened with an 11 blade and extended longitudinally with Leroy Sea  scissors.  The graft was cut to the appropriate length and was sewn end-to-side to the artery with a running 6-0 Prolene suture.  3 dilator passed easily through the graft prior to completion of the closure.  The anastomosis was completed.  There was good graft dependent Doppler flow at the level of the  ankle.  The patient was given 50 mg of protamine reverse heparin.  Wounds irrigated with saline.  Hemostasis electrocautery.  Wounds were closed with 3-0 Vicryl in the subcutaneous and subcuticular tissue.  Sterile dressing was applied and the patient was transferred to the recovery room in stable condition   Rosetta Posner, M.D., [facility] 11/30/2020 11:49 AM  Note: Portions of this report may have been transcribed using voice recognition software.  Every effort has been made to ensure accuracy; however, inadvertent computerized transcription errors may still be present.

## 2020-11-30 NOTE — Anesthesia Procedure Notes (Signed)
Procedure Name: Intubation Date/Time: 11/30/2020 8:17 AM Performed by: Michele Rockers, CRNA Pre-anesthesia Checklist: Patient identified, Patient being monitored, Timeout performed, Emergency Drugs available and Suction available Patient Re-evaluated:Patient Re-evaluated prior to induction Oxygen Delivery Method: Circle System Utilized Preoxygenation: Pre-oxygenation with 100% oxygen Induction Type: IV induction Ventilation: Oral airway inserted - appropriate to patient size and Two handed mask ventilation required Laryngoscope Size: Miller and 3 Grade View: Grade III Tube type: Oral Tube size: 7.5 mm Number of attempts: 1 Airway Equipment and Method: Stylet Placement Confirmation: ETT inserted through vocal cords under direct vision,  positive ETCO2 and breath sounds checked- equal and bilateral Secured at: 23 cm Tube secured with: Tape Dental Injury: Teeth and Oropharynx as per pre-operative assessment

## 2020-11-30 NOTE — Interval H&P Note (Signed)
History and Physical Interval Note:  11/30/2020 7:14 AM  Bradley [name]  has presented today for surgery, with the diagnosis of PAD.  The various methods of treatment have been discussed with the patient and family. After consideration of risks, benefits and other options for treatment, the patient has consented to  Procedure(s): ENDARTERECTOMY FEMORAL RIGHT (Right) BYPASS GRAFT FEMORAL-POPLITEAL ARTERY RIGHT (Right) as a surgical intervention.  The patient's history has been reviewed, patient examined, no change in status, stable for surgery.  I have reviewed the patient's chart and labs.  Questions were answered to the patient's satisfaction.     Curt Jews

## 2020-11-30 NOTE — Transfer of Care (Signed)
Immediate Anesthesia Transfer of Care Note  Patient: Bradley [name]  Procedure(s) Performed: ENDARTERECTOMY FEMORAL RIGHT (Right Leg Upper) BYPASS GRAFT FEMORAL-POPLITEAL ARTERY RIGHT (Right Leg Upper)  Patient Location: PACU  Anesthesia Type:General  Level of Consciousness: drowsy and patient cooperative  Airway & Oxygen Therapy: Patient Spontanous Breathing and Patient connected to face mask oxygen  Post-op Assessment: Report given to RN and Post -op Vital signs reviewed and stable  Post vital signs: Reviewed and stable  Last Vitals:  Vitals Value Taken Time  BP 124/71 11/30/20 1208  Temp    Pulse 81 11/30/20 1213  Resp 13 11/30/20 1213  SpO2 100 % 11/30/20 1213  Vitals shown include unvalidated device data.  Last Pain:  Vitals:   11/30/20 0611  PainSc: 0-No pain         Complications: No complications documented.

## 2020-11-30 NOTE — Anesthesia Procedure Notes (Signed)
Arterial Line Insertion Start/End4/22/2022 7:30 AM, 11/30/2020 7:40 AM Performed by: Darral Dash, DO, anesthesiologist  Patient location: Pre-op. Preanesthetic checklist: patient identified, IV checked, site marked, risks and benefits discussed, surgical consent, monitors and equipment checked, pre-op evaluation, timeout performed and anesthesia consent Lidocaine 1% used for infiltration Right, brachial was placed Catheter size: 20 Fr Hand hygiene performed , maximum sterile barriers used  and Seldinger technique used  Attempts: 1 Procedure performed using ultrasound guided technique. Ultrasound Notes:anatomy identified, needle tip was noted to be adjacent to the nerve/plexus identified and no ultrasound evidence of intravascular and/or intraneural injection Following insertion, dressing applied, line sutured and Biopatch. Post procedure assessment: normal and unchanged  Patient tolerated the procedure well with no immediate complications.

## 2020-11-30 NOTE — Progress Notes (Signed)
Mobility Specialist: Progress Note   11/30/20 1558  Mobility  Activity Dangled on edge of bed  Level of Assistance Minimal assist, patient does 75% or more  Assistive Device None  Mobility Response Tolerated well  Mobility performed by Mobility specialist  $Mobility charge 1 Mobility   Post-Mobility: 84 HR, 96% SpO2  Pt dangled EOB for 4-5 minutes. Pt c/o 4/10 incisional pain in groin. Pt otherwise asx. Will f/u tomorrow.   [facility] Shauntee Karp Mobility Specialist Mobility Specialist Phone: [phone]

## 2020-11-30 NOTE — Progress Notes (Signed)
Patient arrived from PACU, to 4e23 vital signs obtained and CHG bath completed. Patient placed on monitor and CCMD made aware patient on MX40 -23 box. Will continue to monitor. Chablis Losh, Bettina Gavia RN

## 2020-11-30 NOTE — Progress Notes (Signed)
  Day of Surgery Note    Subjective:  No complaints   Vitals:   11/30/20 0612 11/30/20 1208  BP: (!) 165/72 124/71  Pulse: 93 82  Resp: 20 17  Temp: 97.7 F (36.5 C) 97.6 F (36.4 C)  SpO2: 95% 97%    Incisions:   Right groin and right AK incisions are clean Extremities:  Brisk right AT/PT and faint peroneal doppler signals present Cardiac:  regular Lungs:  Non labored    Assessment/Plan:  This is a 75 y.o. male who is s/p  #1 right femoral endarterectomy and Dacron patch angioplasty onto the profundus femoris artery.  #2 right femoral to above-knee popliteal bypass with 6 mm Gore-Tex graft  -incisions look good and he has brisk doppler flow right AT/PT -to 4 east later today -will restart Eliquis tomorrow if no issues   Leontine Locket, PA-C 11/30/2020 12:45 PM [phone]

## 2020-12-01 LAB — CBC
HCT: 38.4 % — ABNORMAL LOW (ref 39.0–52.0)
Hemoglobin: 12.1 g/dL — ABNORMAL LOW (ref 13.0–17.0)
MCH: 30.7 pg (ref 26.0–34.0)
MCHC: 31.5 g/dL (ref 30.0–36.0)
MCV: 97.5 fL (ref 80.0–100.0)
Platelets: 156 10*3/uL (ref 150–400)
RBC: 3.94 MIL/uL — ABNORMAL LOW (ref 4.22–5.81)
RDW: 13.6 % (ref 11.5–15.5)
WBC: 7.2 10*3/uL (ref 4.0–10.5)
nRBC: 0 % (ref 0.0–0.2)

## 2020-12-01 LAB — GLUCOSE, CAPILLARY
Glucose-Capillary: 126 mg/dL — ABNORMAL HIGH (ref 70–99)
Glucose-Capillary: 237 mg/dL — ABNORMAL HIGH (ref 70–99)
Glucose-Capillary: 168 mg/dL — ABNORMAL HIGH (ref 70–99)
Glucose-Capillary: 196 mg/dL — ABNORMAL HIGH (ref 70–99)

## 2020-12-01 LAB — BASIC METABOLIC PANEL
Anion gap: 8 (ref 5–15)
BUN: 29 mg/dL — ABNORMAL HIGH (ref 8–23)
CO2: 28 mmol/L (ref 22–32)
Calcium: 8.3 mg/dL — ABNORMAL LOW (ref 8.9–10.3)
Chloride: 102 mmol/L (ref 98–111)
Creatinine, Ser: 2.72 mg/dL — ABNORMAL HIGH (ref 0.61–1.24)
GFR, Estimated: 23 mL/min — ABNORMAL LOW (ref >60)
Glucose, Bld: 208 mg/dL — ABNORMAL HIGH (ref 70–99)
Potassium: 3.7 mmol/L (ref 3.5–5.1)
Sodium: 138 mmol/L (ref 135–145)

## 2020-12-01 LAB — LIPID PANEL
Cholesterol: 88 mg/dL (ref 0–200)
HDL: 24 mg/dL — ABNORMAL LOW (ref >40)
LDL Cholesterol: 31 mg/dL (ref 0–99)
Total CHOL/HDL Ratio: 3.7 RATIO
Triglycerides: 163 mg/dL — ABNORMAL HIGH (ref <150)
VLDL: 33 mg/dL (ref 0–40)

## 2020-12-01 MED ORDER — TRAMADOL HCL 50 MG PO TABS
50.0000 mg | ORAL_TABLET | Freq: Four times a day (QID) | ORAL | Status: DC
Start: 2020-12-01 — End: 2020-12-02
  Administered 2020-12-01 – 2020-12-02 (×4): 50 mg via ORAL
  Filled 2020-12-01 (×4): qty 1

## 2020-12-01 MED ORDER — INSULIN ASPART (W/NIACINAMIDE) 100 UNIT/ML ~~LOC~~ SOPN: 20.0000 [IU] | PEN_INJECTOR | Freq: Three times a day (TID) | SUBCUTANEOUS | Status: DC

## 2020-12-01 MED ORDER — APIXABAN 5 MG PO TABS
5.0000 mg | ORAL_TABLET | Freq: Two times a day (BID) | ORAL | Status: DC
  Administered 2020-12-01 – 2020-12-02 (×3): 5 mg via ORAL
  Filled 2020-12-01 (×3): qty 1

## 2020-12-01 NOTE — Progress Notes (Signed)
Mobility Specialist: Progress Note   12/01/20 1256  Mobility  Activity Ambulated in hall  Level of Assistance Minimal assist, patient does 75% or more  Assistive Device Front wheel walker  Distance Ambulated (ft) 470 ft  Mobility Response Tolerated well  Mobility performed by Mobility specialist  Bed Position Chair  $Mobility charge 1 Mobility   Pre-Mobility: 91 HR, 155/77 BP, 95% SpO2 Post-Mobility: 96 HR, 173/80 BP, 96% SpO2  Pt was minA to sit EOB from supine as well as to stand. Pt c/o 2/10 pain pre-mobility and said his pain increased to 5-6/10 upon standing. Pt said his pain decreased throughout ambulation though. Pt c/o feeling SOB halfway through walk but unable to get SpO2 reading at this time. Pt to chair after walk with wife present in room.   [facility] Treasa Bradshaw Mobility Specialist Mobility Specialist Phone: [phone]

## 2020-12-01 NOTE — Evaluation (Signed)
Physical Therapy Evaluation Patient Details Name: Bradley [name] MRN: [id number] DOB: Dec 29, 1945 Today's Date: 12/01/2020   History of Present Illness  75 y.o. male presents to [facility] hospital on 11/30/2020 for planned RLE femoral endarterectomy with angioplasty and R femoral to above-knee popliteal bypass due to arterial occlusion. PMH includes arthritis, CKD, CAD, DM, GERD, HLD, IBS, MI, PAF, RBBB, CVA, syncope.  Clinical Impression  Pt demonstrates ability to perform transfers and gait, requiring no physical assistance. Pt requires one standing rest break during gait in hall without the use of RW. Pt reports experiencing a decrease in SOB and improved stability while using RW earlier in the day compared to ambulate without during this session. Pt will continue to benefit from acute PT to address limitations in strength, endurance, balance, and activity tolerance for improved independent mobility.     Follow Up Recommendations No PT follow up    Equipment Recommendations  None recommended by PT    Recommendations for Other Services       Precautions / Restrictions Precautions Precautions: Fall Restrictions Weight Bearing Restrictions: No      Mobility  Bed Mobility Overal bed mobility: Independent             General bed mobility comments: up in recliner    Transfers Overall transfer level: Needs assistance Equipment used: None Transfers: Sit to/from Stand Sit to Stand: Min guard Stand pivot transfers: Supervision       General transfer comment: Pt uses B UE to push on armrest to come to stand from recliner.  Ambulation/Gait Ambulation/Gait assistance: Min guard Gait Distance (Feet): 250 Feet Assistive device: None Gait Pattern/deviations: Step-through pattern;Decreased stride length Gait velocity: reduced Gait velocity interpretation: >2.62 ft/sec, indicative of community ambulatory General Gait Details: Pt ambulates with R LE externally rotated, but reports  that occurs at baseline. Pt reports that compared to walking earlier with mobility specialist with RW, walking without an AD during session results in increased SOB and decreased distance ambulated.  Stairs            Wheelchair Mobility    Modified Rankin (Stroke Patients Only)       Balance Overall balance assessment: Independent Sitting-balance support: Feet supported;No upper extremity supported Sitting balance-Leahy Scale: Good     Standing balance support: No upper extremity supported;During functional activity Standing balance-Leahy Scale: Fair Standing balance comment: Pt reports feeling unstable during gait, no LOB noted.                             Pertinent Vitals/Pain Pain Assessment: Faces Pain Score: 3  Faces Pain Scale: Hurts little more Pain Location: R LE incision site Pain Descriptors / Indicators: Grimacing;Operative site guarding Pain Intervention(s): Limited activity within patient's tolerance;Monitored during session    [place] expects to be discharged to:: Private residence Living Arrangements: Spouse/significant other Available Help at Discharge: Family;Available 24 hours/day Type of Home: House Home Access: Stairs to enter   [employer] of Steps: 1 Home Layout: Two level;Able to live on main level with bedroom/bathroom Home Equipment: Other (comment);Shower seat;Wheelchair - Chiropodist - standard (3 point cane)      Prior Function Level of Independence: Independent         Comments: He did use a RW on occasion     Hand Dominance   Dominant Hand: Right    Extremity/Trunk Assessment   Upper Extremity Assessment Upper Extremity Assessment: Defer to OT evaluation  Lower Extremity Assessment Lower Extremity Assessment: Generalized weakness    Cervical / Trunk Assessment Cervical / Trunk Assessment: Normal  Communication   Communication: No difficulties   Cognition Arousal/Alertness: Awake/alert Behavior During Therapy: WFL for tasks assessed/performed Overall Cognitive Status: Within Functional Limits for tasks assessed                                        General Comments General comments (skin integrity, edema, etc.): Pt reports no pain at rest, but onset of pain with gait and when stand>sit at EOB at end of session.    Exercises     Assessment/Plan    PT Assessment Patient needs continued PT services  PT Problem List Decreased strength;Decreased activity tolerance;Decreased balance;Decreased mobility;Pain       PT Treatment Interventions Gait training;Functional mobility training;Stair training;Therapeutic activities;Therapeutic exercise;Balance training;Patient/family education    PT Goals (Current goals can be found in the Care Plan section)  Acute Rehab PT Goals Patient Stated Goal: Improve mobility and return home. PT Goal Formulation: With patient Time For Goal Achievement: 12/15/20 Potential to Achieve Goals: Good    Frequency Min 3X/week   Barriers to discharge        Co-evaluation               AM-PAC PT "6 Clicks" Mobility  Outcome Measure Help needed turning from your back to your side while in a flat bed without using bedrails?: None Help needed moving from lying on your back to sitting on the side of a flat bed without using bedrails?: None Help needed moving to and from a bed to a chair (including a wheelchair)?: None Help needed standing up from a chair using your arms (e.g., wheelchair or bedside chair)?: None Help needed to walk in hospital room?: A Little Help needed climbing 3-5 steps with a railing? : A Little 6 Click Score: 22    End of Session Equipment Utilized During Treatment: Gait belt Activity Tolerance: Patient limited by fatigue;Patient limited by pain Patient left: in bed;with call bell/phone within reach;with family/visitor present Nurse Communication:  Mobility status PT Visit Diagnosis: Unsteadiness on feet (R26.81);Other abnormalities of gait and mobility (R26.89);Muscle weakness (generalized) (M62.81);Difficulty in walking, not elsewhere classified (R26.2);Pain Pain - Right/Left: Right Pain - part of body: Leg    Time: 3474-2595 PT Time Calculation (min) (ACUTE ONLY): 25 min   Charges:   PT Evaluation $PT Eval Low Complexity: 1 Low          Acute Rehab  Pager: [phone]   Garwin Brothers, SPT  12/01/2020, 3:56 PM

## 2020-12-01 NOTE — Progress Notes (Signed)
PHARMACIST LIPID MONITORING   Bradley [name] is a 75 y.o. male admitted on 11/30/2020 with PAD.  Pharmacy has been consulted to optimize lipid-lowering therapy with the indication of secondary prevention for clinical ASCVD.  Recent Labs:  Lipid Panel (last 6 months):   Lab Results  Component Value Date   CHOL 88 12/01/2020   TRIG 163 (H) 12/01/2020   HDL 24 (L) 12/01/2020   CHOLHDL 3.7 12/01/2020   VLDL 33 12/01/2020   LDLCALC 31 12/01/2020    Hepatic function panel (last 6 months):   Lab Results  Component Value Date   AST 24 11/30/2020   ALT 22 11/30/2020   ALKPHOS 51 11/30/2020   BILITOT 0.7 11/30/2020    SCr (since admission):   Serum creatinine: 2.72 mg/dL (H) 12/01/20 0230 Estimated creatinine clearance: 28.3 mL/min (A)  Current therapy and lipid therapy tolerance Current lipid-lowering therapy: Atorvastatin 40mg  PO daily  Assessment:   No changes indicated to lipid lowering therapy. Patient is on a high intensity statin prior to admission with an LDL <55.  Plan:    1.Statin intensity (high intensity recommended for all patients regardless of the LDL):  No statin changes. The patient is already on a high intensity statin.  2.Add ezetimibe (if any one of the following):   Not indicated at this time.  3.Refer to lipid clinic:   No  4.Follow-up with:  Primary care provider - [name], MD  5.Follow-up labs after discharge:  No changes in lipid therapy, repeat a lipid panel in one year.      Norina Buzzard, PharmD PGY1 Pharmacy Resident 12/01/2020 7:22 AM

## 2020-12-01 NOTE — Evaluation (Signed)
Occupational Therapy Evaluation Patient Details Name: Bradley [name] MRN: [id number] DOB: 1945/12/23 Today's Date: 12/01/2020    History of Present Illness 75 y.o. male presents with severe limiting claudication to his right leg.  He underwent ENDARTERECTOMY FEMORAL RIGHT (Right)  BYPASS GRAFT FEMORAL-POPLITEAL ARTERY RIGHT 4/22.  PMH includes: Acquired equinus deformity of foot, Anemia, Arthritis, Carotid artery occlusion, Hyperlipidemia, Hypertension, Irritable bowel syndrome, Myocardial infarction.   Clinical Impression   Patient admitted for the above diagnosis and procedure.  PTA he admits to occasional use of a RW depending on leg pain.  Prior to surgery it was getting difficult to walk household distances.  He was continuing to drive, assisted with home management, and could perform his own ADL from a sit stand level.  Barriers are listed below, and his spouse can assist as needed.  Currently he is close to his baseline for ADL completion and in room mobility/toileting.  No acute OT needs identified and no apparent post acute OT indicated.      Follow Up Recommendations  No OT follow up    Equipment Recommendations  None recommended by OT    Recommendations for Other Services       Precautions / Restrictions Precautions Precautions: Fall Restrictions Weight Bearing Restrictions: No      Mobility Bed Mobility               General bed mobility comments: up in recliner    Transfers Overall transfer level: Needs assistance Equipment used: Rolling walker (2 wheeled);1 person hand held assist Transfers: Sit to/from [facility] Sit to Stand: Supervision Stand pivot transfers: Supervision            Balance Overall balance assessment: Needs assistance Sitting-balance support: Feet supported Sitting balance-Leahy Scale: Good     Standing balance support: Bilateral upper extremity supported;Single extremity supported Standing balance-Leahy  Scale: Fair Standing balance comment: patient like the stability of the RW, but did walk in the room without RW.  Able to static stand without the need for a RW.                           ADL either performed or assessed with clinical judgement   ADL Overall ADL's : At baseline                                       General ADL Comments: Able to simulate bathing, and placed B socks from a sit/stand level at RW for stability.  Able to walk in room without an AD, but did reach for objects in his environment.  Recommended RW at home in the begining.     Vision Patient Visual Report: No change from baseline                  Pertinent Vitals/Pain Pain Assessment: 0-10 Pain Score: 3  Faces Pain Scale: Hurts a little bit Pain Location: R leg groin incision Pain Descriptors / Indicators: Tightness;Sore Pain Intervention(s): Monitored during session     Hand Dominance Right   Extremity/Trunk Assessment Upper Extremity Assessment Upper Extremity Assessment: Overall WFL for tasks assessed   Lower Extremity Assessment Lower Extremity Assessment: Defer to PT evaluation   Cervical / Trunk Assessment Cervical / Trunk Assessment: Normal   Communication Communication Communication: No difficulties   Cognition Arousal/Alertness: Awake/alert Behavior During Therapy: WFL for tasks assessed/performed  Overall Cognitive Status: Within Functional Limits for tasks assessed                                                      Home Living Family/patient expects to be discharged to:: Private residence Living Arrangements: Spouse/significant other Available Help at Discharge: Family;Available 24 hours/day Type of Home: House Home Access: Stairs to enter [employer] of Steps: 2 threshold height steps   Home Layout: Multi-level;Able to live on main level with bedroom/bathroom     Bathroom Shower/Tub: Radiographer, therapeutic: Standard Bathroom Accessibility: Yes How Accessible: Accessible via walker Home Equipment: Shower seat;Adaptive equipment Adaptive Equipment: Reacher        Prior Functioning/Environment Level of Independence: Independent        Comments: He did use a RW on occasion        OT Problem List: Pain;Impaired balance (sitting and/or standing)      OT Treatment/Interventions:      OT Goals(Current goals can be found in the care plan section)    OT Frequency:     Barriers to D/C:  none noted          Co-evaluation              AM-PAC OT "6 Clicks" Daily Activity     Outcome Measure Help from another person eating meals?: None Help from another person taking care of personal grooming?: None Help from another person toileting, which includes using toliet, bedpan, or urinal?: None Help from another person bathing (including washing, rinsing, drying)?: None Help from another person to put on and taking off regular upper body clothing?: None Help from another person to put on and taking off regular lower body clothing?: None 6 Click Score: 24   End of Session Equipment Utilized During Treatment: Rolling walker  Activity Tolerance: Patient tolerated treatment well Patient left: in chair;with call bell/phone within reach;with family/visitor present  OT Visit Diagnosis: Unsteadiness on feet (R26.81);Pain Pain - Right/Left: Right Pain - part of body: Leg                Time: 1335-1350 OT Time Calculation (min): 15 min Charges:  OT General Charges $OT Visit: 1 Visit OT Evaluation $OT Eval Moderate Complexity: 1 Mod  12/01/2020  Rich, OTR/L  Acute Rehabilitation Services  Office:  [phone]   Metta Clines 12/01/2020, 2:10 PM

## 2020-12-01 NOTE — Discharge Summary (Signed)
Discharge Summary     Bradley [name] 04-Feb-1946 75 y.o. male  [id number]  Admission Date: 11/30/2020  Discharge Date: 12/02/2020  Physician: Rosetta Posner, MD  Admission Diagnosis: PAD (peripheral artery disease) ([place]) [I73.9]  HPI:   This is a 75 y.o. male who is back for follow-up. He has a long-term patient of Dr. Donnetta Hutching who has performed an aortobifemoral bypass graft for lower extremity occlusive disease in 1998 and a left carotid endarterectomy in 1994 with a redo in 2001.  He came into the office last week with worsening cramping with ambulation which have been going on for about 5 to 6 weeks.  His x-ray was tolerance went from 15 minutes down to 2 to 3 minutes on the bike before he has severe cramping.  This does improve with rest his ABIs have significantly decreased.  He is back today for a duplex scan.  He was taking his wife to [facility] for a cardiac ablation last week and on the trip down he noticed that his vision in his right eye was blurry. While he was down there he started to shake and was found to be hypertensive with a blood pressure of 195/95. He was sent to the emergency room. He did not have any other localizing symptoms they did a stroke work-up which consisted of a CT scan and carotid Dopplers. By report the CT scan showed a 95% right carotid stenosis. His symptoms resolved.  On 06/01/2020 he underwent right sided TCAR. Angiographic imaging revealed an 85% stenosis. This resolved after stenting. He is without any new neurologic issues.    Patient suffers from coronary artery disease, status post CABG on 05/23/2015. He takes a statin for hypercholesterolemia. He is a diabetic which is moderately well controlled. He is a former smoker having quit in 2010. He is medically managed for hypertension.He is on Eliquis for atrial fibrillation  Hospital Course:  The patient was admitted to the hospital and taken to the operating room on 11/30/2020 and  underwent: #1 right femoral endarterectomy and Dacron patch angioplasty onto the profundus femoris artery.  #2 right femoral to above-knee popliteal bypass with 6 mm Gore-Tex graft    The pt tolerated the procedure well and was transported to the PACU in good condition.   By POD 1, pt was doing well with brisk doppler signals right foot.   POD 2, pt had ambulated well.  He continued to have good doppler signals in the right foot.  Incisions look fine with mild redness/irritation around above knee incision.  Discussed with Dr. Stanford Breed and will hold on abx for now. He is discharged home.    CBC    Component Value Date/Time   WBC 7.2 12/01/2020 0230   RBC 3.94 (L) 12/01/2020 0230   HGB 12.1 (L) 12/01/2020 0230   HGB 13.9 02/01/2020 1219   HCT 38.4 (L) 12/01/2020 0230   HCT 39.6 02/01/2020 1219   PLT 156 12/01/2020 0230   PLT 258 02/01/2020 1219   MCV 97.5 12/01/2020 0230   MCV 98 (H) 02/01/2020 1219   MCH 30.7 12/01/2020 0230   MCHC 31.5 12/01/2020 0230   RDW 13.6 12/01/2020 0230   RDW 13.1 02/01/2020 1219   LYMPHSABS 1.2 11/13/2017 1505   MONOABS 0.7 11/13/2017 1505   EOSABS 0.2 11/13/2017 1505   BASOSABS 0.0 11/13/2017 1505    BMET    Component Value Date/Time   NA 132 (L) 12/02/2020 0101   NA 134 02/01/2020 1219   K 3.8  12/02/2020 0101   CL 101 12/02/2020 0101   CO2 24 12/02/2020 0101   GLUCOSE 161 (H) 12/02/2020 0101   BUN 12 12/02/2020 0101   BUN 21 02/01/2020 1219   CREATININE 0.81 12/02/2020 0101   CREATININE 1.76 (H) 09/19/2015 1111   CALCIUM 8.9 12/02/2020 0101   GFRNONAA >60 12/02/2020 0101   GFRAA 41 (L) 02/01/2020 1219     Discharge Instructions    Discharge patient   Complete by: As directed    Discharge disposition: 01-Home or Self Care   Discharge patient date: 12/02/2020      Discharge Diagnosis:  PAD (peripheral artery disease) ([place]) [I73.9]  Secondary Diagnosis: Patient Active Problem List   Diagnosis Date Noted  . PAD (peripheral artery  disease) ([place]) 11/30/2020  . Carotid stenosis 06/01/2020  . Carotid stenosis, asymptomatic 06/01/2020  . Syncope 01/12/2020  . Coagulation disorder ([place]) 05/25/2019  . Coronary artery disease involving coronary bypass graft of native heart with angina pectoris ([place])   . PAF (paroxysmal atrial fibrillation) ([place])   . Type 2 diabetes mellitus with vascular disease ([place]) 05/30/2015  . OSA on CPAP 05/30/2015  . Abnormal stress test 05/18/2015  . Diverticulosis of colon without hemorrhage 05/17/2015  . Right bundle branch block 04/30/2015  . Essential hypertension 04/29/2015  . Hyperlipidemia 04/29/2015  . DM type 2, controlled, with complication ([place]) 78/29/5621  . Peripheral vascular disease ([place]) 06/08/2012  . Stress fracture of metatarsal bone 02/18/2012  . Acquired equinus deformity of foot 12/15/2011  . Plantar fasciitis 12/15/2011  . Stenosis of right internal carotid artery with cerebral infarction ([place]) 12/09/2011   Past Medical History:  Diagnosis Date  . Acquired equinus deformity of foot 12/15/2011  . Anemia   . Arthritis   . Carotid artery occlusion    a. Carotid US 10/16: RICA 60-79%, L CEA patent with 1-39% stenosis  . CKD (chronic kidney disease)    stage 3-4  . Coagulation disorder ([place]) 05/25/2019  . Coronary artery disease    a. Myoview 9/16:  EF 52%, inferior fixed defect consistent with diaphragmatic attenuation, intermediate risk secondary to poor exercise tolerance and symptoms during stress;  b. LHC 05/17/2015 90% mid LAD, 80% ost D2, 75% mid RCA, 35% prox RCA. >> S/p CABG  . Coronary artery disease involving coronary bypass graft of native heart with angina pectoris ([place])    CORONARY ARTERY BYPASS GRAFTING x 3 - 05/23/2015 Left internal mammary artery to left anterior descending  Saphenous vein graft to diagonal  Saphenous vein graft to posterior descending Endoscopic greater saphenous vein harvest right thigh   . Diabetes mellitus age 31  . Diverticulitis   .  Diverticulosis of colon without hemorrhage 05/17/2015   By CT scan   . DM type 2, controlled, with complication ([place]) 10/16/6576  . Essential hypertension 04/29/2015  . GERD (gastroesophageal reflux disease)   . H/O hiatal hernia   . History of echocardiogram    a. Echo 9/16: GLS -15.2%, EF 46-96%, grade 1 diastolic dysfunction, normal wall motion, aortic sclerosis, dilated aortic root 39 mm, atrial septal lipomatous hypertrophy  . Hyperlipidemia   . Hypertension   . Irritable bowel syndrome (IBS)   . Myocardial infarction [facility])    silent inferior MI; patient denies MI history (03/17/13)   . Neuropathy 2013  . OSA on CPAP 05/30/2015  . Osteoporosis 2013  . PAF (paroxysmal atrial fibrillation) ([place])    post CABG 2016; Amiodarone stopped 2/2 wheezing; PAF 01/12/20  . Peripheral vascular disease (  [place])   . Plantar fasciitis 12/15/2011  . Reflux esophagitis   . Right bundle branch block 04/30/2015  . Sleep apnea    uses CPAP  . Stenosis of right internal carotid artery with cerebral infarction ([place]) 12/09/2011  . Stress fracture of metatarsal bone 02/18/2012  . Stroke [facility]) 1987   Right brain stroke- slight drop on left side  . Syncope 01/12/2020  . Type 2 diabetes mellitus with vascular disease ([place]) 05/30/2015  . Wears glasses      Allergies as of 12/02/2020      Reactions   Doxazosin Mesylate    Other reaction(s): Diarrhea, dizziness   Hydrocodone Nausea Only   Niacin    Other reaction(s): Skin irritation   Oxycodone Nausea Only   Penicillins Rash   Has patient had a PCN reaction causing immediate rash, facial/tongue/throat swelling, SOB or lightheadedness with hypotension: NO Has patient had a PCN reaction causing severe rash involving mucus membranes or skin necrosis:NO Has patient had a PCN reaction that required hospitalization NO Has patient had a PCN reaction occurring within the last 10 years: NO If all of the above answers are "NO", then may proceed with Cephalosporin use.    Sulfa Drugs Cross Reactors Rash      Medication List    TAKE these medications   amLODipine 10 MG tablet Commonly known as: NORVASC Take 1 tablet (10 mg total) by mouth daily.   apixaban 5 MG Tabs tablet Commonly known as: ELIQUIS Take 1 tablet (5 mg total) by mouth 2 (two) times daily.   aspirin EC 81 MG tablet Take 81 mg by mouth daily. Swallow whole.   atorvastatin 40 MG tablet Commonly known as: LIPITOR Take 40 mg by mouth daily.   dexlansoprazole 60 MG capsule Commonly known as: DEXILANT Take 60 mg by mouth daily.   Fiasp FlexTouch 100 UNIT/ML FlexTouch Pen Generic drug: insulin aspart Inject 20-25 Units into the skin in the morning, at noon, and at bedtime. Sliding scale as per the carbohydrate intake.   Foltanx RF 3-90.314-2-35 MG Caps Take 1 capsule by mouth in the morning and at bedtime.   gabapentin 300 MG capsule Commonly known as: NEURONTIN Take 600 mg by mouth at bedtime.   hydrALAZINE 25 MG tablet Commonly known as: APRESOLINE Take 1 tablet (25 mg total) by mouth in the morning and at bedtime.   hydrocortisone 2.5 % cream Apply 1 application topically 2 (two) times daily as needed (irritation).   hydrocortisone 2.5 % rectal cream Commonly known as: ANUSOL-HC Place 1 application rectally daily as needed for hemorrhoids.   ketoconazole 2 % shampoo Commonly known as: NIZORAL Apply 1 application topically daily as needed for irritation.   Melatonin 2.5 MG Chew Chew 2.5 mg by mouth at bedtime.   methocarbamol 500 MG tablet Commonly known as: ROBAXIN Take 500 mg by mouth every 6 (six) hours as needed for muscle spasms.   metoprolol tartrate 50 MG tablet Commonly known as: LOPRESSOR Take 75 mg by mouth 2 (two) times daily.   nitroGLYCERIN 0.3 MG SL tablet Commonly known as: NITROSTAT Place 0.3 mg under the tongue every 5 (five) minutes as needed for chest pain. Only as needed   testosterone cypionate 200 MG/ML injection Commonly known as:  DEPOTESTOSTERONE CYPIONATE Inject 200 mg into the muscle once a week.   torsemide 20 MG tablet Commonly known as: DEMADEX Take 20 mg by mouth daily.   traMADol 50 MG tablet Commonly known as: ULTRAM Take 1 tablet (50  mg total) by mouth every 6 (six) hours as needed.   Tyler Aas FlexTouch 100 UNIT/ML FlexTouch Pen Generic drug: insulin degludec Inject 25 Units into the skin daily. AM   Trulicity 4.13 KG/4.0NU Sopn Generic drug: Dulaglutide Inject 0.75 mg into the skin once a week. Sunday morning   Vitamin D3 125 MCG (5000 UT) Tabs Take 5,000 Units by mouth daily.       Discharge Instructions: [facility] Discharge instructions Lower Extremity Bypass Surgery  Please refer to the following instruction for your post-procedure care. Your surgeon or physician assistant will discuss any changes with you.  Activity  You are encouraged to walk as much as you can. You can slowly return to normal activities during the month after your surgery. Avoid strenuous activity and heavy lifting until your doctor tells you it's OK. Avoid activities such as vacuuming or swinging a golf club. Do not drive until your doctor give the OK and you are no longer taking prescription pain medications. It is also normal to have difficulty with sleep habits, eating and bowel movement after surgery. These will go away with time.  Bathing/Showering  You may shower after you go home. Do not soak in a bathtub, hot tub, or swim until the incision heals completely.  Incision Care  Clean your incision with mild soap and water. Shower every day. Pat the area dry with a clean towel. You do not need a bandage unless otherwise instructed. Do not apply any ointments or creams to your incision. If you have open wounds you will be instructed how to care for them or a visiting nurse may be arranged for you. If you have staples or sutures along your incision they will be removed at your post-op  appointment. You may have skin glue on your incision. Do not peel it off. It will come off on its own in about one week.  Wash the groin wound with soap and water daily and pat dry. (No tub bath-only shower)  Then put a dry gauze or washcloth in the groin to keep this area dry to help prevent wound infection.  Do this daily and as needed.  Do not use Vaseline or neosporin on your incisions.  Only use soap and water on your incisions and then protect and keep dry.  Diet  Resume your normal diet. There are no special food restrictions following this procedure. A low fat/ low cholesterol diet is recommended for all patients with vascular disease. In order to heal from your surgery, it is CRITICAL to get adequate nutrition. Your body requires vitamins, minerals, and protein. Vegetables are the best source of vitamins and minerals. Vegetables also provide the perfect balance of protein. Processed food has little nutritional value, so try to avoid this.  Medications  Resume taking all your medications unless your doctor or Physician Assistant tells you not to. If your incision is causing pain, you may take over-the-counter pain relievers such as acetaminophen (Tylenol). If you were prescribed a stronger pain medication, please aware these medication can cause nausea and constipation. Prevent nausea by taking the medication with a snack or meal. Avoid constipation by drinking plenty of fluids and eating foods with high amount of fiber, such as fruits, vegetables, and grains. Take Colace 100 mg (an over-the-counter stool softener) twice a day as needed for constipation.  Do not take Tylenol if you are taking prescription pain medications.  Follow Up  Our office will schedule a follow up appointment 2-3  weeks following discharge.  Please call us immediately for any of the following conditions  .Severe or worsening pain in your legs or feet while at rest or while walking .Increase pain, redness, warmth, or  drainage (pus) from your incision site(s) . Fever of 101 degree or higher . The swelling in your leg with the bypass suddenly worsens and becomes more painful than when you were in the hospital . If you have been instructed to feel your graft pulse then you should do so every day. If you can no longer feel this pulse, call the office immediately. Not all patients are given this instruction. .  Leg swelling is common after leg bypass surgery.  The swelling should improve over a few months following surgery. To improve the swelling, you may elevate your legs above the level of your heart while you are sitting or resting. Your surgeon or physician assistant may ask you to apply an ACE wrap or wear compression (TED) stockings to help to reduce swelling.  Reduce your risk of vascular disease  Stop smoking. If you would like help call [facility] at 1-800-QUIT-NOW 2232093134) or [place] at [phone].  . Manage your cholesterol . Maintain a desired weight . Control your diabetes weight . Control your diabetes . Keep your blood pressure down .  If you have any questions, please call the office at [phone]   Prescriptions given: 1.  Tramadol #30 No Refill  Disposition: home  Patient's condition: is Good  Follow up: 1. Dr. Donnetta Hutching in 2-3 weeks in [facility] [place] [facility] [phone] 12/02/2020  8:54 AM  - For VQI Registry use ---   Post-op:  Wound infection: No  Graft infection: No  Transfusion: No    If yes, n/a units given New Arrhythmia: No Ipsilateral amputation: No, [ ]  Minor, [ ]  BKA, [ ]  AKA Discharge patency: [x ] Primary, [ ]  Primary assisted, [ ]  Secondary, [ ]  Occluded Patency judged by: [ ]  Dopper only, [ ]  Palpable graft pulse, [x]  Palpable distal pulse, [ ]  ABI inc. > 0.15, [ ]  Duplex Discharge ABI: R not done, L  D/C Ambulatory Status: Ambulatory  Complications: MI: No, [ ]  Troponin only, [ ]  EKG or  Clinical CHF: No Resp failure:No, [ ]  Pneumonia, [ ]  Ventilator Chg in renal function: No, [ ]  Inc. Cr > 0.5, [ ]  Temp. Dialysis,  [ ]  Permanent dialysis Stroke: No, [ ]  Minor, [ ]  Major Return to OR: No  Reason for return to OR: [ ]  Bleeding, [ ]  Infection, [ ]  Thrombosis, [ ]  Revision  Discharge medications: Statin use:  yes ASA use:  yes Plavix use:  no Beta blocker use: yes CCB use:  Yes ACEI use:   no ARB use:  no Coumadin use: no

## 2020-12-01 NOTE — Progress Notes (Addendum)
  Progress Note    12/01/2020 7:56 AM 1 Day Post-Op  Subjective:  No complaints  Afebrile HR 80's-90's NSR 099'I-338'S systolic 50% RA  Vitals:   12/01/20 0340 12/01/20 0544  BP: 139/64   Pulse: 80   Resp: 17 15  Temp: 97.7 F (36.5 C) 98.4 F (36.9 C)  SpO2: 97% 98%    Physical Exam: Cardiac:  regular Lungs:  Non labored Incisions:  Right groin and right AK incisions look good Extremities:  Brisk doppler signals right AT/PT; foot warm and well perfused.  Right calf is soft and non tender   CBC    Component Value Date/Time   WBC 7.2 12/01/2020 0230   RBC 3.94 (L) 12/01/2020 0230   HGB 12.1 (L) 12/01/2020 0230   HGB 13.9 02/01/2020 1219   HCT 38.4 (L) 12/01/2020 0230   HCT 39.6 02/01/2020 1219   PLT 156 12/01/2020 0230   PLT 258 02/01/2020 1219   MCV 97.5 12/01/2020 0230   MCV 98 (H) 02/01/2020 1219   MCH 30.7 12/01/2020 0230   MCHC 31.5 12/01/2020 0230   RDW 13.6 12/01/2020 0230   RDW 13.1 02/01/2020 1219   LYMPHSABS 1.2 11/13/2017 1505   MONOABS 0.7 11/13/2017 1505   EOSABS 0.2 11/13/2017 1505   BASOSABS 0.0 11/13/2017 1505    BMET    Component Value Date/Time   NA 138 12/01/2020 0230   NA 134 02/01/2020 1219   K 3.7 12/01/2020 0230   CL 102 12/01/2020 0230   CO2 28 12/01/2020 0230   GLUCOSE 208 (H) 12/01/2020 0230   BUN 29 (H) 12/01/2020 0230   BUN 21 02/01/2020 1219   CREATININE 2.72 (H) 12/01/2020 0230   CREATININE 1.76 (H) 09/19/2015 1111   CALCIUM 8.3 (L) 12/01/2020 0230   GFRNONAA 23 (L) 12/01/2020 0230   GFRAA 41 (L) 02/01/2020 1219    INR    Component Value Date/Time   INR 1.0 11/30/2020 0647     Intake/Output Summary (Last 24 hours) at 12/01/2020 0756 Last data filed at 12/01/2020 0540 Gross per 24 hour  Intake 2150 ml  Output 2220 ml  Net -70 ml     Assessment:  75 y.o. male is s/p:  #1 right femoral endarterectomy and Dacron patch angioplasty onto the profundus femoris artery.  #2 right femoral to above-knee popliteal  bypass with 6 mm Gore-Tex graft  1 Day Post-Op  Plan: -pt doing well this morning with good doppler signals right foot; calf is soft and non tender -will mobilize pt today -creatinine slightly elevated from baseline yesterday 2.7 from 2.5 pre-op--will check labs in the am. -DM-elevated serum glucose this am but 126 on finger stick- will hold on restarting home insulin aspart SSI -DVT prophylaxis:  Restart Eliquis today.  Sq heparin discontinued.  -discussed groin wound care with pt  -discussed with pt where he would like to f/u-he would like to see Dr. Donnetta Hutching in [place].  Message sent to office to arrange appt in 2-3 weeks.     Leontine Locket, PA-C [facility] [phone] 12/01/2020 7:56 AM  VASCULAR STAFF ADDENDUM: I have independently interviewed and examined the patient. I agree with the above.  Looks great POD#1 fem - ak pop bypass with PTFE Mobilize, PRN pain control, voiding trial. Restart Eliquis. May be ready for home tomorrow.  [name]. Stanford Breed, MD [facility] Phone Number: [phone] 12/01/2020 10:27 AM

## 2020-12-02 ENCOUNTER — Encounter (HOSPITAL_COMMUNITY): Payer: Self-pay | Admitting: Vascular Surgery

## 2020-12-02 LAB — BASIC METABOLIC PANEL
Anion gap: 7 (ref 5–15)
BUN: 12 mg/dL (ref 8–23)
CO2: 24 mmol/L (ref 22–32)
Calcium: 8.9 mg/dL (ref 8.9–10.3)
Chloride: 101 mmol/L (ref 98–111)
Creatinine, Ser: 0.81 mg/dL (ref 0.61–1.24)
GFR, Estimated: >60 mL/min (ref >60)
Glucose, Bld: 161 mg/dL — ABNORMAL HIGH (ref 70–99)
Potassium: 3.8 mmol/L (ref 3.5–5.1)
Sodium: 132 mmol/L — ABNORMAL LOW (ref 135–145)

## 2020-12-02 LAB — GLUCOSE, CAPILLARY: Glucose-Capillary: 173 mg/dL — ABNORMAL HIGH (ref 70–99)

## 2020-12-02 MED ORDER — TRAMADOL HCL 50 MG PO TABS: 50.0000 mg | ORAL_TABLET | Freq: Four times a day (QID) | ORAL | 0 refills | Status: DC | PRN

## 2020-12-02 NOTE — Progress Notes (Signed)
Mobility Specialist: Progress Note   12/02/20 1141  Mobility  Activity Ambulated in hall  Level of Assistance Independent  Assistive Device None  Distance Ambulated (ft) 290 ft  Mobility Response Tolerated well  Mobility performed by Mobility specialist  $Mobility charge 1 Mobility   Pre-Mobility: 90 HR, 92% SpO2 Post-Mobility: 95 HR, 167/82 BP, 98% SpO2  Pt c/o feeling SOB during ambulation and stopped to take a short standing break. Pt otherwise asx. Pt back to bed after walk and is hopeful for discharge soon.   [facility] Le[place] Kenna Mobility Specialist Mobility Specialist Phone: [phone]

## 2020-12-02 NOTE — Progress Notes (Addendum)
Progress Note    12/02/2020 8:43 AM 2 Days Post-Op  Subjective:  Says he had a spasm in his right leg and then in the left but it didn't last as long.  Pain medicine helped the spasm.  Says he walked yesterday.  Wants to go home.   Afebrile HR 80's-90's  301'S-010'X systolic 32% RA  Vitals:   12/02/20 0340 12/02/20 0811  BP: 133/74 (!) 150/74  Pulse: 93 90  Resp: 16 17  Temp: 98.4 F (36.9 C) 97.9 F (36.6 C)  SpO2: 100% 94%    Physical Exam: Cardiac:  regular Lungs:  Non labored Incisions:  Right groin clean and dry with some ecchymosis; right above knee incision with mild redness Extremities:  +doppler signals right DP/PT; swelling right foot   CBC    Component Value Date/Time   WBC 7.2 12/01/2020 0230   RBC 3.94 (L) 12/01/2020 0230   HGB 12.1 (L) 12/01/2020 0230   HGB 13.9 02/01/2020 1219   HCT 38.4 (L) 12/01/2020 0230   HCT 39.6 02/01/2020 1219   PLT 156 12/01/2020 0230   PLT 258 02/01/2020 1219   MCV 97.5 12/01/2020 0230   MCV 98 (H) 02/01/2020 1219   MCH 30.7 12/01/2020 0230   MCHC 31.5 12/01/2020 0230   RDW 13.6 12/01/2020 0230   RDW 13.1 02/01/2020 1219   LYMPHSABS 1.2 11/13/2017 1505   MONOABS 0.7 11/13/2017 1505   EOSABS 0.2 11/13/2017 1505   BASOSABS 0.0 11/13/2017 1505    BMET    Component Value Date/Time   NA 132 (L) 12/02/2020 0101   NA 134 02/01/2020 1219   K 3.8 12/02/2020 0101   CL 101 12/02/2020 0101   CO2 24 12/02/2020 0101   GLUCOSE 161 (H) 12/02/2020 0101   BUN 12 12/02/2020 0101   BUN 21 02/01/2020 1219   CREATININE 0.81 12/02/2020 0101   CREATININE 1.76 (H) 09/19/2015 1111   CALCIUM 8.9 12/02/2020 0101   GFRNONAA >60 12/02/2020 0101   GFRAA 41 (L) 02/01/2020 1219    INR    Component Value Date/Time   INR 1.0 11/30/2020 0647     Intake/Output Summary (Last 24 hours) at 12/02/2020 0843 Last data filed at 12/02/2020 3557 Gross per 24 hour  Intake 780 ml  Output 1763 ml  Net -983 ml     Assessment:  75 y.o.  male is s/p:  #1 right femoral endarterectomy and Dacron patch angioplasty onto the profundus femoris artery. #2 right femoral to above-knee popliteal bypass with 6 mm Gore-Tex graft  2 Days Post-Op  Plan: -pt doing well this morning with good doppler signals right foot.  He does have some swelling in the right foot and discussed leg elevation when he is not up and walking.   -mild redness around above knee incision-Dr. Stanford Breed inspected incision and feels no need for abx at this time.  -he did well with walking yesterday.  PT did not recommend any further follow up.  -discharge home today and f/u with Dr. Donnetta Hutching in [place] in 2-3 weeks.      Leontine Locket, PA-C [facility] [phone] 12/02/2020 8:43 AM  VASCULAR STAFF ADDENDUM: I have independently interviewed and examined the patient. I agree with the above.  Looks great POD#2 R CFA - AK pop bypass with PTFE. Ready for discharge. Reviewed discharge precautions with patient. Follow up with Dr. Donnetta Hutching or me in next 2-3 weeks for wound check, ABI.  [name]. Stanford Breed, MD [facility]  [facility] Office Phone Number: [phone] 12/02/2020 10:27 AM

## 2020-12-03 ENCOUNTER — Telehealth: Payer: Self-pay

## 2020-12-03 DIAGNOSIS — I739 Peripheral vascular disease, unspecified: Secondary | ICD-10-CM | POA: Diagnosis not present

## 2020-12-03 DIAGNOSIS — I779 Disorder of arteries and arterioles, unspecified: Secondary | ICD-10-CM | POA: Diagnosis not present

## 2020-12-03 DIAGNOSIS — Z09 Encounter for follow-up examination after completed treatment for conditions other than malignant neoplasm: Secondary | ICD-10-CM | POA: Diagnosis not present

## 2020-12-03 NOTE — Telephone Encounter (Signed)
Patient called to report some mild redness and swelling. Says it's about the same. Was seen yesterday prior to discharge and swelling and redness also present - denies drainage/fever/chills. Advised patient MD does not currently suspect infection, continue to watch area - if it starts draining, has odor, etc - inform office. Patient verbalizes understanding.

## 2020-12-06 ENCOUNTER — Telehealth: Payer: Self-pay

## 2020-12-06 NOTE — Telephone Encounter (Signed)
Patient called to report swelling in his right leg s/p fem endart and bypass about a week ago. He says he is having some pain around the incisions, but he is able to walk and denies pain in his feet. Advised patient that swelling after a bypass can last up to 6 months but to call sooner than f/u appt if he developed severe pain, ulcers, color or temperature pain. He verbalized understanding.

## 2020-12-10 DIAGNOSIS — E1165 Type 2 diabetes mellitus with hyperglycemia: Secondary | ICD-10-CM | POA: Diagnosis not present

## 2020-12-12 DIAGNOSIS — N184 Chronic kidney disease, stage 4 (severe): Secondary | ICD-10-CM | POA: Diagnosis not present

## 2020-12-12 DIAGNOSIS — I10 Essential (primary) hypertension: Secondary | ICD-10-CM | POA: Diagnosis not present

## 2020-12-12 DIAGNOSIS — I639 Cerebral infarction, unspecified: Secondary | ICD-10-CM | POA: Diagnosis not present

## 2020-12-12 DIAGNOSIS — I2510 Atherosclerotic heart disease of native coronary artery without angina pectoris: Secondary | ICD-10-CM | POA: Diagnosis not present

## 2020-12-12 DIAGNOSIS — E1142 Type 2 diabetes mellitus with diabetic polyneuropathy: Secondary | ICD-10-CM | POA: Diagnosis not present

## 2020-12-12 DIAGNOSIS — E119 Type 2 diabetes mellitus without complications: Secondary | ICD-10-CM | POA: Diagnosis not present

## 2020-12-12 DIAGNOSIS — I4891 Unspecified atrial fibrillation: Secondary | ICD-10-CM | POA: Diagnosis not present

## 2020-12-12 DIAGNOSIS — K219 Gastro-esophageal reflux disease without esophagitis: Secondary | ICD-10-CM | POA: Diagnosis not present

## 2020-12-12 DIAGNOSIS — E7800 Pure hypercholesterolemia, unspecified: Secondary | ICD-10-CM | POA: Diagnosis not present

## 2020-12-12 DIAGNOSIS — E1165 Type 2 diabetes mellitus with hyperglycemia: Secondary | ICD-10-CM | POA: Diagnosis not present

## 2020-12-12 DIAGNOSIS — M8580 Other specified disorders of bone density and structure, unspecified site: Secondary | ICD-10-CM | POA: Diagnosis not present

## 2020-12-16 NOTE — Progress Notes (Signed)
Cardiology Office Note:    Date:  12/18/2020   ID:  Bradley [name], DOB 01-21-1946, MRN [id number]  PCP:  [name], MD  Cardiologist:  Sinclair Grooms, MD   Referring MD: [name], MD   Chief Complaint  Patient presents with  . Coronary Artery Disease  . Chest Pain  . Hyperlipidemia  . Hypertension    History of Present Illness:    Bradley [name] is a 75 y.o. male with a hx of CAD s/p CABG [id number] combined systolic and diastolic heart failure,HTN, HLD, OSA, PAD with L CEA x2and aorto-bifem bypass, OSA on CPAP, New onset PAF leading to Eliquis and discontinuation of plavix/aggrenox(PAD), R TCAR 06/01/2020, and right femoral endarterectomy 11/2020.  He is doing pretty well.  Concerned that the right leg has remained swollen after right femoral endarterectomy by Dr. Sherren Mocha early in April.  Walking in the day he had significant shortness of breath and some tightness in his chest.  He thought it was due to indigestion.  He feels great at this point.  EKG was done while discomfort was persisting did not reveal any ischemic changes.  Noticed left thigh and buttocks pain with exercising today.  This feels similar to claudication that he had many years ago prior to aortobifem.  Right leg edema developed 3 days after surgery.  Notified via [facility].  He was told that this can happen and last up to several weeks after surgery.  Past Medical History:  Diagnosis Date  . Acquired equinus deformity of foot 12/15/2011  . Anemia   . Arthritis   . Carotid artery occlusion    a. Carotid US 10/16: RICA 60-79%, L CEA patent with 1-39% stenosis  . CKD (chronic kidney disease)    stage 3-4  . Coagulation disorder ([place]) 05/25/2019  . Coronary artery disease    a. Myoview 9/16:  EF 52%, inferior fixed defect consistent with diaphragmatic attenuation, intermediate risk secondary to poor exercise tolerance and symptoms during stress;  b. LHC 05/17/2015 90% mid LAD, 80% ost D2,  75% mid RCA, 35% prox RCA. >> S/p CABG  . Coronary artery disease involving coronary bypass graft of native heart with angina pectoris ([place])    CORONARY ARTERY BYPASS GRAFTING x 3 - 05/23/2015 Left internal mammary artery to left anterior descending  Saphenous vein graft to diagonal  Saphenous vein graft to posterior descending Endoscopic greater saphenous vein harvest right thigh   . Diabetes mellitus age 1  . Diverticulitis   . Diverticulosis of colon without hemorrhage 05/17/2015   By CT scan   . DM type 2, controlled, with complication ([place]) 01/29/3085  . Essential hypertension 04/29/2015  . GERD (gastroesophageal reflux disease)   . H/O hiatal hernia   . History of echocardiogram    a. Echo 9/16: GLS -15.2%, EF 57-84%, grade 1 diastolic dysfunction, normal wall motion, aortic sclerosis, dilated aortic root 39 mm, atrial septal lipomatous hypertrophy  . Hyperlipidemia   . Hypertension   . Irritable bowel syndrome (IBS)   . Myocardial infarction [facility])    silent inferior MI; patient denies MI history (03/17/13)   . Neuropathy 2013  . OSA on CPAP 05/30/2015  . Osteoporosis 2013  . PAF (paroxysmal atrial fibrillation) ([place])    post CABG 2016; Amiodarone stopped 2/2 wheezing; PAF 01/12/20  . Peripheral vascular disease ([place])   . Plantar fasciitis 12/15/2011  . Reflux esophagitis   . Right bundle branch block 04/30/2015  . Sleep apnea  uses CPAP  . Stenosis of right internal carotid artery with cerebral infarction ([place]) 12/09/2011  . Stress fracture of metatarsal bone 02/18/2012  . Stroke [facility]) 1987   Right brain stroke- slight drop on left side  . Syncope 01/12/2020  . Type 2 diabetes mellitus with vascular disease ([place]) 05/30/2015  . Wears glasses     Past Surgical History:  Procedure Laterality Date  . ABDOMINAL AORTOGRAM W/LOWER EXTREMITY Bilateral 11/27/2020   Procedure: ABDOMINAL AORTOGRAM W/LOWER EXTREMITY;  Surgeon: Serafina Mitchell, MD;  Location: [place] CV LAB;  Service:  Cardiovascular;  Laterality: Bilateral;  . CARDIAC CATHETERIZATION N/A 05/17/2015   Procedure: Left Heart Cath and Coronary Angiography;  Surgeon: Belva Crome, MD;  Location: [place] CV LAB;  Service: Cardiovascular;  Laterality: N/A;  . CAROTID ENDARTERECTOMY  1994 & redo 2001   Left  . COLONOSCOPY W/ BIOPSIES AND POLYPECTOMY    . CORONARY ARTERY BYPASS GRAFT N/A 05/23/2015   Procedure: CORONARY ARTERY BYPASS GRAFTING (CABG) x 3 (LIMA to LAD, SVG to DIAGONAL 2, SVG to PDA) with Endoscopic Vein Harvesting from right greater saphenous vein;  Surgeon: Grace Isaac, MD;  Location: [place];  Service: Open Heart Surgery;  Laterality: N/A;  . ENDARTERECTOMY FEMORAL Right 11/30/2020   Procedure: ENDARTERECTOMY FEMORAL RIGHT;  Surgeon: Rosetta Posner, MD;  Location: [place];  Service: Vascular;  Laterality: Right;  . FEMORAL-POPLITEAL BYPASS GRAFT Right 11/30/2020   Procedure: BYPASS GRAFT FEMORAL-POPLITEAL ARTERY RIGHT;  Surgeon: Rosetta Posner, MD;  Location: [place];  Service: Vascular;  Laterality: Right;  . FRACTURE SURGERY  2013   Right   foo  t X's 2  . ILIAC ARTERY STENT    . LAPAROSCOPIC CHOLECYSTECTOMY  09/11/2016  . POSTERIOR LUMBAR FUSION  Aug. 14, 2014   Level 1  . PR VEIN BYPASS GRAFT,AORTO-FEM-POP  1998  . [place] SURGERY  2014  . TEE WITHOUT CARDIOVERSION N/A 05/23/2015   Procedure: TRANSESOPHAGEAL ECHOCARDIOGRAM (TEE);  Surgeon: Grace Isaac, MD;  Location: [place];  Service: Open Heart Surgery;  Laterality: N/A;  . TONSILLECTOMY    . TRANSCAROTID ARTERY REVASCULARIZATION Right 06/01/2020   Procedure: RIGHT TRANSCAROTID ARTERY REVASCULARIZATION;  Surgeon: Serafina Mitchell, MD;  Location: [facility] OR;  Service: Vascular;  Laterality: Right;  . TRIGGER FINGER RELEASE Right 07/14/2017   Procedure: RIGHT LONG FINGER TRIGGER RELEASE;  Surgeon: Milly Jakob, MD;  Location: [place];  Service: Orthopedics;  Laterality: Right;    Current Medications: Current Meds   Medication Sig  . amLODipine (NORVASC) 10 MG tablet Take 1 tablet (10 mg total) by mouth daily.  Marland Kitchen apixaban (ELIQUIS) 5 MG TABS tablet Take 1 tablet (5 mg total) by mouth 2 (two) times daily.  Marland Kitchen aspirin EC 81 MG tablet Take 81 mg by mouth daily. Swallow whole.  Marland Kitchen atorvastatin (LIPITOR) 40 MG tablet Take 40 mg by mouth daily.  . Cholecalciferol (VITAMIN D3) 125 MCG (5000 UT) TABS Take 5,000 Units by mouth daily.   Marland Kitchen dexlansoprazole (DEXILANT) 60 MG capsule Take 60 mg by mouth daily.   Marland Kitchen FIASP FLEXTOUCH 100 UNIT/ML SOPN Inject 20-25 Units into the skin in the morning, at noon, and at bedtime. Sliding scale as per the carbohydrate intake.  . gabapentin (NEURONTIN) 300 MG capsule Take 600 mg by mouth at bedtime.   . hydrALAZINE (APRESOLINE) 25 MG tablet Take 1 tablet (25 mg total) by mouth in the morning and at bedtime.  . hydrocortisone (ANUSOL-HC) 2.5 %  rectal cream Place 1 application rectally daily as needed for hemorrhoids.   . hydrocortisone 2.5 % cream Apply 1 application topically 2 (two) times daily as needed (irritation).  Marland Kitchen ketoconazole (NIZORAL) 2 % shampoo Apply 1 application topically daily as needed for irritation.   Marland Kitchen L-Methylfolate-Algae-B12-B6 (FOLTANX RF) 3-90.314-2-35 MG CAPS Take 1 capsule by mouth in the morning and at bedtime.   . Melatonin 2.5 MG CHEW Chew 2.5 mg by mouth at bedtime.  . methocarbamol (ROBAXIN) 500 MG tablet Take 500 mg by mouth every 6 (six) hours as needed for muscle spasms.   . metoprolol tartrate (LOPRESSOR) 50 MG tablet Take 75 mg by mouth 2 (two) times daily.  . nitroGLYCERIN (NITROSTAT) 0.3 MG SL tablet Place 0.3 mg under the tongue every 5 (five) minutes as needed for chest pain. Only as needed  . testosterone cypionate (DEPOTESTOSTERONE CYPIONATE) 200 MG/ML injection Inject 200 mg into the muscle once a week.  . torsemide (DEMADEX) 20 MG tablet Take 20 mg by mouth daily.  . traMADol (ULTRAM) 50 MG tablet Take 1 tablet (50 mg total) by mouth every 6  (six) hours as needed.  Tyler Aas FLEXTOUCH 100 UNIT/ML SOPN FlexTouch Pen Inject 25 Units into the skin daily. AM  . TRULICITY 4.26 ST/4.1DQ SOPN Inject 0.75 mg into the skin once a week. Sunday morning     Allergies:   Doxazosin mesylate, Hydrocodone, Niacin, Oxycodone, Penicillins, and Sulfa drugs cross reactors   Social History   Socioeconomic History  . Marital status: Married    Spouse name: Not on file  . Number of children: Not on file  . Years of education: Not on file  . Highest education level: Not on file  Occupational History  . Not on file  Tobacco Use  . Smoking status: Former Smoker    Packs/day: 2.00    Years: 40.00    Pack years: 80.00    Types: Cigarettes    Quit date: 03/26/2009    Years since quitting: 11.7  . Smokeless tobacco: Never Used  Vaping Use  . Vaping Use: Never used  Substance and Sexual Activity  . Alcohol use: No  . Drug use: No  . Sexual activity: Not on file  Other Topics Concern  . Not on file  Social History Narrative  . Not on file   Social Determinants of Health   Financial Resource Strain: Not on file  Food Insecurity: Not on file  Transportation Needs: Not on file  Physical Activity: Not on file  Stress: Not on file  Social Connections: Not on file     Family History: The patient's family history includes Cancer (age of onset: 59) in his mother; Deep vein thrombosis in his father; Hyperlipidemia in his father and mother; Hypertension in his father; Stroke (age of onset: 4) in his father.  ROS:   Please see the history of present illness.    No claudication in the right leg.  Now feels like he is having left buttocks and thigh discomfort with walking.  All other systems reviewed and are negative.  EKGs/Labs/Other Studies Reviewed:    The following studies were reviewed today: No new data  EKG:  EKG normal sinus rhythm, inferoposterior infarction pattern, when compared to prior tracing from 02/01/2020, no significant  changes noted.  Recent Labs: 11/30/2020: ALT 22 12/01/2020: Hemoglobin 12.1; Platelets 156 12/02/2020: BUN 12; Creatinine, Ser 0.81; Potassium 3.8; Sodium 132  Recent Lipid Panel    Component Value Date/Time   CHOL  88 12/01/2020 0230   TRIG 163 (H) 12/01/2020 0230   HDL 24 (L) 12/01/2020 0230   CHOLHDL 3.7 12/01/2020 0230   VLDL 33 12/01/2020 0230   LDLCALC 31 12/01/2020 0230    Physical Exam:    VS:  BP (!) 110/56   Pulse 77   Ht 5' 11.5" (1.816 m)   Wt 227 lb (103 kg)   SpO2 98%   BMI 31.22 kg/m     Wt Readings from Last 3 Encounters:  12/18/20 227 lb (103 kg)  11/30/20 225 lb 1.4 oz (102.1 kg)  11/27/20 225 lb (102.1 kg)     GEN: Obese. No acute distress HEENT: Normal NECK: No JVD. LYMPHATICS: No lymphadenopathy CARDIAC: No murmur. RRR S4 gallop. VASCULAR: Right leg edema. RESPIRATORY:  Clear to auscultation without rales, wheezing or rhonchi  ABDOMEN: Soft, non-tender, non-distended, No pulsatile mass, MUSCULOSKELETAL: No deformity  SKIN: Warm and dry NEUROLOGIC:  Alert and oriented x 3 PSYCHIATRIC:  Normal affect   ASSESSMENT:    1. Coronary artery disease involving coronary bypass graft of native heart with angina pectoris ([place])   2. S/P aortobifemoral bypass surgery   3. Bilateral extracranial carotid artery stenosis   4. PAF (paroxysmal atrial fibrillation) ([place])   5. Essential hypertension   6. Hyperlipidemia, unspecified hyperlipidemia type   7. OSA on CPAP   8. Type 2 diabetes mellitus with vascular disease ([place])    PLAN:    In order of problems listed above:  1. May have had an episode of angina walking into the office today.  This is his first day of significant activity.  Discomfort gradually resolved after getting into the office and sitting down.  EKG did not demonstrate any significant abnormality.  He was advised to use sublingual nitroglycerin if he has recurrent discomfort of this nature.  If that occurs while ambulating, he should stop  and rest.  If it is not relieved after 5 minutes take a sublingual nitro.  Notify us if nitroglycerin use is required. 2. Status post right lower extremity femoral revascularization.  Now with left hip and thigh claudication. 3. No neurological complaints.  Recent TCAR on the right. 4. No clinical evidence of A. fib on today's exam.  Continue Eliquis 5 mg twice a day.  He has been on this since shortly after the recent vascular surgical procedure.  This makes DVT in the right leg much less likely.  Blood pressure today is excellent.  He wanted me to repeat his blood pressures because he thought that perhaps the recorded pressure was an error.  Left arm 112/62, right arm 125/62 mmHg. 5. Continue high intensity statin therapy. 6. Wears CPAP consistently. 7. Target A1c less than 7.  He is potentially a good candidate for [place] or Vania Rea given his multi territory vascular disease.  Follow-up denies angina and 2 to 3 months unless episodes seem to increase in frequency.  Ischemic evaluation.   Medication Adjustments/Labs and Tests Ordered: Current medicines are reviewed at length with the patient today.  Concerns regarding medicines are outlined above.  Orders Placed This Encounter  Procedures  . EKG 12-Lead   No orders of the defined types were placed in this encounter.   Patient Instructions  Medication Instructions:  Your physician recommends that you continue on your current medications as directed. Please refer to the Current Medication list given to you today.  *If you need a refill on your cardiac medications before your next appointment, please call your pharmacy*  Lab Work: None If you have labs (blood work) drawn today and your tests are completely normal, you will receive your results only by: Marland Kitchen MyChart Message (if you have MyChart) OR . A paper copy in the mail If you have any lab test that is abnormal or we need to change your treatment, we will call you to review the  results.   Testing/Procedures: None   Follow-Up: At [facility], you and your health needs are our priority.  As part of our continuing mission to provide you with exceptional heart care, we have created designated Provider Care Teams.  These Care Teams include your primary Cardiologist (physician) and Advanced Practice Providers (APPs -  Physician Assistants and Nurse Practitioners) who all work together to provide you with the care you need, when you need it.  We recommend signing up for the patient portal called "MyChart".  Sign up information is provided on this After Visit Summary.  MyChart is used to connect with patients for Virtual Visits (Telemedicine).  Patients are able to view lab/test results, encounter notes, upcoming appointments, etc.  Non-urgent messages can be sent to your provider as well.   To learn more about what you can do with MyChart, go to [email or web address].    Your next appointment:   3 month(s)  The format for your next appointment:   In Person  Provider:   You may see Sinclair Grooms, MD or one of the following Advanced Practice Providers on your designated Care Team:    Kathyrn Drown, NP    Other Instructions      Signed, Sinclair Grooms, MD  12/18/2020 3:44 PM    [place]

## 2020-12-18 ENCOUNTER — Other Ambulatory Visit: Payer: Self-pay

## 2020-12-18 ENCOUNTER — Ambulatory Visit: Payer: PPO | Admitting: Interventional Cardiology

## 2020-12-18 ENCOUNTER — Encounter: Payer: Self-pay | Admitting: Interventional Cardiology

## 2020-12-18 VITALS — BP 110/56 | HR 77 | Ht 71.5 in | Wt 227.0 lb

## 2020-12-18 DIAGNOSIS — E785 Hyperlipidemia, unspecified: Secondary | ICD-10-CM

## 2020-12-18 DIAGNOSIS — E1159 Type 2 diabetes mellitus with other circulatory complications: Secondary | ICD-10-CM | POA: Diagnosis not present

## 2020-12-18 DIAGNOSIS — I25709 Atherosclerosis of coronary artery bypass graft(s), unspecified, with unspecified angina pectoris: Secondary | ICD-10-CM | POA: Diagnosis not present

## 2020-12-18 DIAGNOSIS — Z95828 Presence of other vascular implants and grafts: Secondary | ICD-10-CM | POA: Diagnosis not present

## 2020-12-18 DIAGNOSIS — Z9989 Dependence on other enabling machines and devices: Secondary | ICD-10-CM

## 2020-12-18 DIAGNOSIS — I6523 Occlusion and stenosis of bilateral carotid arteries: Secondary | ICD-10-CM

## 2020-12-18 DIAGNOSIS — I480 Paroxysmal atrial fibrillation: Secondary | ICD-10-CM | POA: Diagnosis not present

## 2020-12-18 DIAGNOSIS — G4733 Obstructive sleep apnea (adult) (pediatric): Secondary | ICD-10-CM | POA: Diagnosis not present

## 2020-12-18 DIAGNOSIS — I10 Essential (primary) hypertension: Secondary | ICD-10-CM | POA: Diagnosis not present

## 2020-12-18 NOTE — Patient Instructions (Signed)
Medication Instructions:  Your physician recommends that you continue on your current medications as directed. Please refer to the Current Medication list given to you today.  *If you need a refill on your cardiac medications before your next appointment, please call your pharmacy*   Lab Work: None If you have labs (blood work) drawn today and your tests are completely normal, you will receive your results only by: Marland Kitchen MyChart Message (if you have MyChart) OR . A paper copy in the mail If you have any lab test that is abnormal or we need to change your treatment, we will call you to review the results.   Testing/Procedures: None   Follow-Up: At [facility], you and your health needs are our priority.  As part of our continuing mission to provide you with exceptional heart care, we have created designated Provider Care Teams.  These Care Teams include your primary Cardiologist (physician) and Advanced Practice Providers (APPs -  Physician Assistants and Nurse Practitioners) who all work together to provide you with the care you need, when you need it.  We recommend signing up for the patient portal called "MyChart".  Sign up information is provided on this After Visit Summary.  MyChart is used to connect with patients for Virtual Visits (Telemedicine).  Patients are able to view lab/test results, encounter notes, upcoming appointments, etc.  Non-urgent messages can be sent to your provider as well.   To learn more about what you can do with MyChart, go to [email or web address].    Your next appointment:   3 month(s)  The format for your next appointment:   In Person  Provider:   You may see Sinclair Grooms, MD or one of the following Advanced Practice Providers on your designated Care Team:    Kathyrn Drown, NP    Other Instructions

## 2020-12-24 ENCOUNTER — Other Ambulatory Visit: Payer: Self-pay

## 2020-12-24 ENCOUNTER — Ambulatory Visit (INDEPENDENT_AMBULATORY_CARE_PROVIDER_SITE_OTHER): Payer: PPO | Admitting: Vascular Surgery

## 2020-12-24 ENCOUNTER — Encounter: Payer: Self-pay | Admitting: Vascular Surgery

## 2020-12-24 VITALS — BP 125/65 | HR 71 | Temp 98.1°F | Resp 18 | Ht 71.5 in | Wt 232.0 lb

## 2020-12-24 DIAGNOSIS — I779 Disorder of arteries and arterioles, unspecified: Secondary | ICD-10-CM

## 2020-12-24 NOTE — Progress Notes (Signed)
[facility] [place]  Patient name: Bradley [name] MRN: [id number] DOB: 01-18-1946 Sex: male  REASON FOR VISIT: Follow-up right femoral above-knee popliteal bypass and right common femoral endarterectomy on 11/30/2020  HPI: Bradley [name] is a 75 y.o. male here today for follow-up.  Underwent right external iliac and common femoral endarterectomy with Dacron patch angioplasty and right femoral to above-knee popliteal with Gore-Tex.  He did not have usable saphenous vein.  He did well in the hospital and was discharged to home.  He is here today with his wife.  He does have typical swelling in his right leg following femoropopliteal bypass.  He also reports some hypersensitivity on the medial knee below the medial popliteal incision.  I explained that this is related to the skin incision itself and both of these issues should continue to resolve spontaneously without treatment.  I explained to him that if he did have severe swelling that he could try an Ace wrap for improvement as well.  Current Outpatient Medications  Medication Sig Dispense Refill  . amLODipine (NORVASC) 10 MG tablet Take 1 tablet (10 mg total) by mouth daily. 90 tablet 3  . apixaban (ELIQUIS) 5 MG TABS tablet Take 1 tablet (5 mg total) by mouth 2 (two) times daily. 180 tablet 3  . aspirin EC 81 MG tablet Take 81 mg by mouth daily. Swallow whole.    Marland Kitchen atorvastatin (LIPITOR) 40 MG tablet Take 40 mg by mouth daily.    . Cholecalciferol (VITAMIN D3) 125 MCG (5000 UT) TABS Take 5,000 Units by mouth daily.     Marland Kitchen dexlansoprazole (DEXILANT) 60 MG capsule Take 60 mg by mouth daily.     Marland Kitchen FIASP FLEXTOUCH 100 UNIT/ML SOPN Inject 20-25 Units into the skin in the morning, at noon, and at bedtime. Sliding scale as per the carbohydrate intake.    . gabapentin (NEURONTIN) 300 MG capsule Take 600 mg by mouth at bedtime.     . hydrALAZINE (APRESOLINE) 25 MG tablet Take 1 tablet (25 mg  total) by mouth in the morning and at bedtime. 180 tablet 3  . hydrocortisone (ANUSOL-HC) 2.5 % rectal cream Place 1 application rectally daily as needed for hemorrhoids.     . hydrocortisone 2.5 % cream Apply 1 application topically 2 (two) times daily as needed (irritation).    Marland Kitchen ketoconazole (NIZORAL) 2 % shampoo Apply 1 application topically daily as needed for irritation.     Marland Kitchen L-Methylfolate-Algae-B12-B6 (FOLTANX RF) 3-90.314-2-35 MG CAPS Take 1 capsule by mouth in the morning and at bedtime.     . Melatonin 2.5 MG CHEW Chew 2.5 mg by mouth at bedtime.    . methocarbamol (ROBAXIN) 500 MG tablet Take 500 mg by mouth every 6 (six) hours as needed for muscle spasms.     . metoprolol tartrate (LOPRESSOR) 50 MG tablet Take 75 mg by mouth 2 (two) times daily.    . nitroGLYCERIN (NITROSTAT) 0.3 MG SL tablet Place 0.3 mg under the tongue every 5 (five) minutes as needed for chest pain. Only as needed    . testosterone cypionate (DEPOTESTOSTERONE CYPIONATE) 200 MG/ML injection Inject 200 mg into the muscle once a week.    . torsemide (DEMADEX) 20 MG tablet Take 20 mg by mouth daily.    . traMADol (ULTRAM) 50 MG tablet Take 1 tablet (50 mg total) by mouth every 6 (six) hours as needed. 30 tablet 0  . TRESIBA FLEXTOUCH 100 UNIT/ML SOPN FlexTouch Pen Inject 25  Units into the skin daily. AM    . TRULICITY 4.08 XK/4.8JE SOPN Inject 0.75 mg into the skin once a week. Sunday morning     No current facility-administered medications for this visit.     PHYSICAL EXAM: Vitals:   12/24/20 1401  BP: 125/65  Pulse: 71  Resp: 18  Temp: 98.1 F (36.7 C)  TempSrc: Other (Comment)  SpO2: 93%  Weight: 232 lb (105.2 kg)  Height: 5' 11.5" (1.816 m)    GENERAL: The patient is a well-nourished male, in no acute distress. The vital signs are documented above. Groin and popliteal incisions well-healed.  I do not palpate pedal pulses.  He has excellent Doppler flow in his foot in the dorsalis pedis, posterior  tibial and peroneal arteries.  MEDICAL ISSUES: Stable 3-week status post right femoral endarterectomy and right femoral to popliteal bypass.  We will continue walking without limitation.  I will see him again in 3 months with repeat office visit examination and noninvasive studies.   Rosetta Posner, MD FACS [facility] [phone]  Note: Portions of this report may have been transcribed using voice recognition software.  Every effort has been made to ensure accuracy; however, inadvertent computerized transcription errors may still be present.

## 2021-01-09 DIAGNOSIS — E1165 Type 2 diabetes mellitus with hyperglycemia: Secondary | ICD-10-CM | POA: Diagnosis not present

## 2021-01-15 DIAGNOSIS — M549 Dorsalgia, unspecified: Secondary | ICD-10-CM | POA: Diagnosis not present

## 2021-01-15 DIAGNOSIS — M9902 Segmental and somatic dysfunction of thoracic region: Secondary | ICD-10-CM | POA: Diagnosis not present

## 2021-01-15 DIAGNOSIS — M542 Cervicalgia: Secondary | ICD-10-CM | POA: Diagnosis not present

## 2021-01-15 DIAGNOSIS — M9901 Segmental and somatic dysfunction of cervical region: Secondary | ICD-10-CM | POA: Diagnosis not present

## 2021-01-15 DIAGNOSIS — M5412 Radiculopathy, cervical region: Secondary | ICD-10-CM | POA: Diagnosis not present

## 2021-01-15 DIAGNOSIS — M79673 Pain in unspecified foot: Secondary | ICD-10-CM | POA: Diagnosis not present

## 2021-01-15 DIAGNOSIS — R609 Edema, unspecified: Secondary | ICD-10-CM | POA: Diagnosis not present

## 2021-01-15 DIAGNOSIS — M5033 Other cervical disc degeneration, cervicothoracic region: Secondary | ICD-10-CM | POA: Diagnosis not present

## 2021-01-17 ENCOUNTER — Other Ambulatory Visit: Payer: Self-pay | Admitting: Family Medicine

## 2021-01-17 ENCOUNTER — Ambulatory Visit
Admission: RE | Admit: 2021-01-17 | Discharge: 2021-01-17 | Disposition: A | Discharge status: Home / Self Care | Payer: PPO | Source: Ambulatory Visit | Attending: Family Medicine | Admitting: Family Medicine

## 2021-01-17 DIAGNOSIS — M79671 Pain in right foot: Secondary | ICD-10-CM

## 2021-01-17 DIAGNOSIS — M778 Other enthesopathies, not elsewhere classified: Secondary | ICD-10-CM | POA: Diagnosis not present

## 2021-01-17 DIAGNOSIS — M7989 Other specified soft tissue disorders: Secondary | ICD-10-CM | POA: Diagnosis not present

## 2021-01-21 ENCOUNTER — Other Ambulatory Visit: Payer: Self-pay

## 2021-01-21 MED ORDER — TORSEMIDE 20 MG PO TABS: 20.0000 mg | ORAL_TABLET | Freq: Every day | ORAL | 3 refills | Status: DC

## 2021-01-22 DIAGNOSIS — M5412 Radiculopathy, cervical region: Secondary | ICD-10-CM | POA: Diagnosis not present

## 2021-01-22 DIAGNOSIS — G4733 Obstructive sleep apnea (adult) (pediatric): Secondary | ICD-10-CM | POA: Diagnosis not present

## 2021-01-22 DIAGNOSIS — M9901 Segmental and somatic dysfunction of cervical region: Secondary | ICD-10-CM | POA: Diagnosis not present

## 2021-01-22 DIAGNOSIS — M5033 Other cervical disc degeneration, cervicothoracic region: Secondary | ICD-10-CM | POA: Diagnosis not present

## 2021-01-22 DIAGNOSIS — M9902 Segmental and somatic dysfunction of thoracic region: Secondary | ICD-10-CM | POA: Diagnosis not present

## 2021-01-28 DIAGNOSIS — N183 Chronic kidney disease, stage 3 unspecified: Secondary | ICD-10-CM | POA: Diagnosis not present

## 2021-01-28 DIAGNOSIS — I2510 Atherosclerotic heart disease of native coronary artery without angina pectoris: Secondary | ICD-10-CM | POA: Diagnosis not present

## 2021-01-28 DIAGNOSIS — Z9989 Dependence on other enabling machines and devices: Secondary | ICD-10-CM | POA: Diagnosis not present

## 2021-01-28 DIAGNOSIS — R809 Proteinuria, unspecified: Secondary | ICD-10-CM | POA: Diagnosis not present

## 2021-01-28 DIAGNOSIS — I639 Cerebral infarction, unspecified: Secondary | ICD-10-CM | POA: Diagnosis not present

## 2021-01-28 DIAGNOSIS — E785 Hyperlipidemia, unspecified: Secondary | ICD-10-CM | POA: Diagnosis not present

## 2021-01-28 DIAGNOSIS — E1122 Type 2 diabetes mellitus with diabetic chronic kidney disease: Secondary | ICD-10-CM | POA: Diagnosis not present

## 2021-01-28 DIAGNOSIS — E871 Hypo-osmolality and hyponatremia: Secondary | ICD-10-CM | POA: Diagnosis not present

## 2021-01-28 DIAGNOSIS — R5383 Other fatigue: Secondary | ICD-10-CM | POA: Diagnosis not present

## 2021-01-28 DIAGNOSIS — I129 Hypertensive chronic kidney disease with stage 1 through stage 4 chronic kidney disease, or unspecified chronic kidney disease: Secondary | ICD-10-CM | POA: Diagnosis not present

## 2021-01-28 DIAGNOSIS — I6529 Occlusion and stenosis of unspecified carotid artery: Secondary | ICD-10-CM | POA: Diagnosis not present

## 2021-01-28 DIAGNOSIS — N179 Acute kidney failure, unspecified: Secondary | ICD-10-CM | POA: Diagnosis not present

## 2021-01-30 ENCOUNTER — Telehealth: Payer: Self-pay

## 2021-01-30 NOTE — Telephone Encounter (Signed)
Patient left VM today to report swelling in his right leg and a stress fracture. He would like to know if he can wear  a boot s/p fem endart and bypass on 4/22. Discussed with PA. Left return VM with patient to elevate the leg and remove boot frequently to check skin condition. Advised him to call back if his leg had any acute changes, including color or temperature changes or if he was unable to move it.

## 2021-01-31 DIAGNOSIS — S92354A Nondisplaced fracture of fifth metatarsal bone, right foot, initial encounter for closed fracture: Secondary | ICD-10-CM | POA: Diagnosis not present

## 2021-02-05 DIAGNOSIS — M9902 Segmental and somatic dysfunction of thoracic region: Secondary | ICD-10-CM | POA: Diagnosis not present

## 2021-02-05 DIAGNOSIS — M9901 Segmental and somatic dysfunction of cervical region: Secondary | ICD-10-CM | POA: Diagnosis not present

## 2021-02-05 DIAGNOSIS — M5033 Other cervical disc degeneration, cervicothoracic region: Secondary | ICD-10-CM | POA: Diagnosis not present

## 2021-02-05 DIAGNOSIS — M5412 Radiculopathy, cervical region: Secondary | ICD-10-CM | POA: Diagnosis not present

## 2021-02-08 DIAGNOSIS — E1165 Type 2 diabetes mellitus with hyperglycemia: Secondary | ICD-10-CM | POA: Diagnosis not present

## 2021-02-12 DIAGNOSIS — I4891 Unspecified atrial fibrillation: Secondary | ICD-10-CM | POA: Diagnosis not present

## 2021-02-12 DIAGNOSIS — I10 Essential (primary) hypertension: Secondary | ICD-10-CM | POA: Diagnosis not present

## 2021-02-12 DIAGNOSIS — E1142 Type 2 diabetes mellitus with diabetic polyneuropathy: Secondary | ICD-10-CM | POA: Diagnosis not present

## 2021-02-12 DIAGNOSIS — K219 Gastro-esophageal reflux disease without esophagitis: Secondary | ICD-10-CM | POA: Diagnosis not present

## 2021-02-12 DIAGNOSIS — M8580 Other specified disorders of bone density and structure, unspecified site: Secondary | ICD-10-CM | POA: Diagnosis not present

## 2021-02-12 DIAGNOSIS — N184 Chronic kidney disease, stage 4 (severe): Secondary | ICD-10-CM | POA: Diagnosis not present

## 2021-02-12 DIAGNOSIS — E7800 Pure hypercholesterolemia, unspecified: Secondary | ICD-10-CM | POA: Diagnosis not present

## 2021-02-12 DIAGNOSIS — I2510 Atherosclerotic heart disease of native coronary artery without angina pectoris: Secondary | ICD-10-CM | POA: Diagnosis not present

## 2021-02-12 DIAGNOSIS — E1165 Type 2 diabetes mellitus with hyperglycemia: Secondary | ICD-10-CM | POA: Diagnosis not present

## 2021-02-12 DIAGNOSIS — E119 Type 2 diabetes mellitus without complications: Secondary | ICD-10-CM | POA: Diagnosis not present

## 2021-02-19 DIAGNOSIS — M5412 Radiculopathy, cervical region: Secondary | ICD-10-CM | POA: Diagnosis not present

## 2021-02-19 DIAGNOSIS — M9901 Segmental and somatic dysfunction of cervical region: Secondary | ICD-10-CM | POA: Diagnosis not present

## 2021-02-19 DIAGNOSIS — M5033 Other cervical disc degeneration, cervicothoracic region: Secondary | ICD-10-CM | POA: Diagnosis not present

## 2021-02-19 DIAGNOSIS — M9902 Segmental and somatic dysfunction of thoracic region: Secondary | ICD-10-CM | POA: Diagnosis not present

## 2021-02-20 DIAGNOSIS — M79671 Pain in right foot: Secondary | ICD-10-CM | POA: Diagnosis not present

## 2021-03-05 DIAGNOSIS — M5033 Other cervical disc degeneration, cervicothoracic region: Secondary | ICD-10-CM | POA: Diagnosis not present

## 2021-03-05 DIAGNOSIS — M5412 Radiculopathy, cervical region: Secondary | ICD-10-CM | POA: Diagnosis not present

## 2021-03-05 DIAGNOSIS — M9901 Segmental and somatic dysfunction of cervical region: Secondary | ICD-10-CM | POA: Diagnosis not present

## 2021-03-05 DIAGNOSIS — M9902 Segmental and somatic dysfunction of thoracic region: Secondary | ICD-10-CM | POA: Diagnosis not present

## 2021-03-07 DIAGNOSIS — L821 Other seborrheic keratosis: Secondary | ICD-10-CM | POA: Diagnosis not present

## 2021-03-07 DIAGNOSIS — L570 Actinic keratosis: Secondary | ICD-10-CM | POA: Diagnosis not present

## 2021-03-07 DIAGNOSIS — X32XXXA Exposure to sunlight, initial encounter: Secondary | ICD-10-CM | POA: Diagnosis not present

## 2021-03-07 DIAGNOSIS — D485 Neoplasm of uncertain behavior of skin: Secondary | ICD-10-CM | POA: Diagnosis not present

## 2021-03-07 DIAGNOSIS — D0461 Carcinoma in situ of skin of right upper limb, including shoulder: Secondary | ICD-10-CM | POA: Diagnosis not present

## 2021-03-11 DIAGNOSIS — E1142 Type 2 diabetes mellitus with diabetic polyneuropathy: Secondary | ICD-10-CM | POA: Diagnosis not present

## 2021-03-19 DIAGNOSIS — M9902 Segmental and somatic dysfunction of thoracic region: Secondary | ICD-10-CM | POA: Diagnosis not present

## 2021-03-19 DIAGNOSIS — M9901 Segmental and somatic dysfunction of cervical region: Secondary | ICD-10-CM | POA: Diagnosis not present

## 2021-03-19 DIAGNOSIS — M5033 Other cervical disc degeneration, cervicothoracic region: Secondary | ICD-10-CM | POA: Diagnosis not present

## 2021-03-19 DIAGNOSIS — M5412 Radiculopathy, cervical region: Secondary | ICD-10-CM | POA: Diagnosis not present

## 2021-03-19 NOTE — Progress Notes (Signed)
Cardiology Office Note:    Date:  03/20/2021   ID:  Bradley [name], DOB 1946-05-16, MRN [id number]  PCP:  [name], MD  Cardiologist:  Sinclair Grooms, MD   Referring MD: [name], MD   No chief complaint on file.   History of Present Illness:    Bradley [name] is a 75 y.o. male with a hx of CAD s/p CABG x3V2016, chronic combined systolic and diastolic heart failure, HTN, HLD, OSA, PAD with L CEA x2 and aorto-bifem bypass, OSA on CPAP, New onset PAF leading to Eliquis and discontinuation of plavix/aggrenox (PAD), R TCAR 06/01/2020, and right femoral endarterectomy 11/2020.   Since right femoral endarterectomy and right femoral above-the-knee popliteal bypass with Gore-Tex graft, the patient has suffered very uncomfortable right lower extremity edema.  This is caused him to cut back on physical activity.  He has discomfort due to the tenseness of the discomfort.  States he will be trying to see Dr. Sherren Mocha early about managing this.  From a cardiac standpoint, in the interval he has not been active.  He is gaining weight.  He has been trying to lie completely flat with his legs elevated.  He has burning in his throat at night and he feels it is related to lying down and having reflux.  He has tried nitroglycerin without relief.  The burning discomfort does not occur with physical activity.  He has a little more shortness of breath now than he has had in the past and feels it is related to deconditioning.  Past Medical History:  Diagnosis Date   Acquired equinus deformity of foot 12/15/2011   Anemia    Arthritis    Carotid artery occlusion    a. Carotid US 10/16: RICA 60-79%, L CEA patent with 1-39% stenosis   CKD (chronic kidney disease)    stage 3-4   Coagulation disorder ([place]) 05/25/2019   Coronary artery disease    a. Myoview 9/16:  EF 52%, inferior fixed defect consistent with diaphragmatic attenuation, intermediate risk secondary to poor exercise tolerance and  symptoms during stress;  b. LHC 05/17/2015 90% mid LAD, 80% ost D2, 75% mid RCA, 35% prox RCA. >> S/p CABG   Coronary artery disease involving coronary bypass graft of native heart with angina pectoris ([place])    CORONARY ARTERY BYPASS GRAFTING x 3 - 05/23/2015 Left internal mammary artery to left anterior descending  Saphenous vein graft to diagonal  Saphenous vein graft to posterior descending Endoscopic greater saphenous vein harvest right thigh     Diabetes mellitus age 65   Diverticulitis    Diverticulosis of colon without hemorrhage 05/17/2015   By CT scan    DM type 2, controlled, with complication ([place]) 2/35/3614   Essential hypertension 04/29/2015   GERD (gastroesophageal reflux disease)    H/O hiatal hernia    History of echocardiogram    a. Echo 9/16: GLS -15.2%, EF 43-15%, grade 1 diastolic dysfunction, normal wall motion, aortic sclerosis, dilated aortic root 39 mm, atrial septal lipomatous hypertrophy   Hyperlipidemia    Hypertension    Irritable bowel syndrome (IBS)    Myocardial infarction ([place])    silent inferior MI; patient denies MI history (03/17/13)    Neuropathy 2013   OSA on CPAP 05/30/2015   Osteoporosis 2013   PAF (paroxysmal atrial fibrillation) ([place])    post CABG 2016; Amiodarone stopped 2/2 wheezing; PAF 01/12/20   Peripheral vascular disease ([place])    Plantar fasciitis 12/15/2011  Reflux esophagitis    Right bundle branch block 04/30/2015   Sleep apnea    uses CPAP   Stenosis of right internal carotid artery with cerebral infarction ([place]) 12/09/2011   Stress fracture of metatarsal bone 02/18/2012   Stroke ([place]) 1987   Right brain stroke- slight drop on left side   Syncope 01/12/2020   Type 2 diabetes mellitus with vascular disease ([place]) 05/30/2015   Wears glasses     Past Surgical History:  Procedure Laterality Date   ABDOMINAL AORTOGRAM W/LOWER EXTREMITY Bilateral 11/27/2020   Procedure: ABDOMINAL AORTOGRAM W/LOWER EXTREMITY;  Surgeon: Serafina Mitchell, MD;   Location: [facility];  Service: Cardiovascular;  Laterality: Bilateral;   CARDIAC CATHETERIZATION N/A 05/17/2015   Procedure: Left Heart Cath and Coronary Angiography;  Surgeon: Belva Crome, MD;  Location: [facility];  Service: Cardiovascular;  Laterality: N/A;   CAROTID ENDARTERECTOMY  1994 & redo 2001   Left   COLONOSCOPY W/ BIOPSIES AND POLYPECTOMY     CORONARY ARTERY BYPASS GRAFT N/A 05/23/2015   Procedure: CORONARY ARTERY BYPASS GRAFTING (CABG) x 3 (LIMA to LAD, SVG to DIAGONAL 2, SVG to PDA) with Endoscopic Vein Harvesting from right greater saphenous vein;  Surgeon: Grace Isaac, MD;  Location: [place] [facility];  Service: Open Heart Surgery;  Laterality: N/A;   ENDARTERECTOMY FEMORAL Right 11/30/2020   Procedure: ENDARTERECTOMY FEMORAL RIGHT;  Surgeon: Rosetta Posner, MD;  Location: [facility];  Service: Vascular;  Laterality: Right;   FEMORAL-POPLITEAL BYPASS GRAFT Right 11/30/2020   Procedure: BYPASS GRAFT FEMORAL-POPLITEAL ARTERY RIGHT;  Surgeon: Rosetta Posner, MD;  Location: [place] [place];  Service: Vascular;  Laterality: Right;   FRACTURE SURGERY  2013   Right   foo  t X's 2   ILIAC ARTERY STENT     LAPAROSCOPIC CHOLECYSTECTOMY  09/11/2016   POSTERIOR LUMBAR FUSION  Aug. 14, 2014   Level 1   [place] SURGERY  2014   TEE WITHOUT CARDIOVERSION N/A 05/23/2015   Procedure: TRANSESOPHAGEAL ECHOCARDIOGRAM (TEE);  Surgeon: Grace Isaac, MD;  Location: [facility];  Service: Open Heart Surgery;  Laterality: N/A;   TONSILLECTOMY     TRANSCAROTID ARTERY REVASCULARIZATION  Right 06/01/2020   Procedure: RIGHT TRANSCAROTID ARTERY REVASCULARIZATION;  Surgeon: Serafina Mitchell, MD;  Location: [facility];  Service: Vascular;  Laterality: Right;   TRIGGER FINGER RELEASE Right 07/14/2017   Procedure: RIGHT LONG FINGER TRIGGER RELEASE;  Surgeon: Milly Jakob, MD;  Location: [facility];  Service: Orthopedics;  Laterality: Right;    Current  Medications: Current Meds  Medication Sig   amLODipine (NORVASC) 10 MG tablet Take 1 tablet (10 mg total) by mouth daily.   apixaban (ELIQUIS) 5 MG TABS tablet Take 1 tablet (5 mg total) by mouth 2 (two) times daily.   aspirin EC 81 MG tablet Take 81 mg by mouth daily. Swallow whole.   atorvastatin (LIPITOR) 40 MG tablet Take 40 mg by mouth daily.   Cholecalciferol (VITAMIN D3) 125 MCG (5000 UT) TABS Take 5,000 Units by mouth daily.    dexlansoprazole (DEXILANT) 60 MG capsule Take 60 mg by mouth daily.    FIASP FLEXTOUCH 100 UNIT/ML SOPN Inject 20-25 Units into the skin in the morning, at noon, and at bedtime. Sliding scale as per the carbohydrate intake.   gabapentin (NEURONTIN) 300 MG capsule Take 600 mg by mouth at bedtime.    hydrALAZINE (APRESOLINE) 25 MG tablet Take 1  tablet (25 mg total) by mouth in the morning and at bedtime.   hydrocortisone (ANUSOL-HC) 2.5 % rectal cream Place 1 application rectally daily as needed for hemorrhoids.    hydrocortisone 2.5 % cream Apply 1 application topically 2 (two) times daily as needed (irritation).   ketoconazole (NIZORAL) 2 % shampoo Apply 1 application topically daily as needed for irritation.    L-Methylfolate-Algae-B12-B6 (FOLTANX RF) 3-90.314-2-35 MG CAPS Take 1 capsule by mouth in the morning and at bedtime.    Melatonin 2.5 MG CHEW Chew 2.5 mg by mouth at bedtime.   methocarbamol (ROBAXIN) 500 MG tablet Take 500 mg by mouth every 6 (six) hours as needed for muscle spasms.    metoprolol tartrate (LOPRESSOR) 50 MG tablet Take 75 mg by mouth 2 (two) times daily.   nitroGLYCERIN (NITROSTAT) 0.3 MG SL tablet Place 0.3 mg under the tongue every 5 (five) minutes as needed for chest pain. Only as needed   testosterone cypionate (DEPOTESTOSTERONE CYPIONATE) 200 MG/ML injection Inject 200 mg into the muscle once a week.   torsemide (DEMADEX) 20 MG tablet Take 1 tablet (20 mg total) by mouth daily.   traMADol (ULTRAM) 50 MG tablet Take 1 tablet (50 mg  total) by mouth every 6 (six) hours as needed.   TRESIBA FLEXTOUCH 100 UNIT/ML SOPN FlexTouch Pen Inject 25 Units into the skin daily. AM   TRULICITY 9.38 BO/1.7PZ SOPN Inject 0.75 mg into the skin once a week. Sunday morning     Allergies:   Doxazosin mesylate, Hydrocodone, Niacin, Oxycodone, Penicillins, and Sulfa drugs cross reactors   Social History   Socioeconomic History   Marital status: Married    Spouse name: Not on file   Number of children: Not on file   Years of education: Not on file   Highest education level: Not on file  Occupational History   Not on file  Tobacco Use   Smoking status: Former    Packs/day: 2.00    Years: 40.00    Pack years: 80.00    Types: Cigarettes    Quit date: 03/26/2009    Years since quitting: 11.9   Smokeless tobacco: Never  Vaping Use   Vaping Use: Never used  Substance and Sexual Activity   Alcohol use: No   Drug use: No   Sexual activity: Not on file  Other Topics Concern   Not on file  Social History Narrative   Not on file   Social Determinants of Health   Financial Resource Strain: Not on file  Food Insecurity: Not on file  Transportation Needs: Not on file  Physical Activity: Not on file  Stress: Not on file  Social Connections: Not on file     Family History: The patient's family history includes Cancer (age of onset: 59) in his mother; Deep vein thrombosis in his father; Hyperlipidemia in his father and mother; Hypertension in his father; Stroke (age of onset: 17) in his father.  ROS:   Please see the history of present illness.    Suffered a stress fracture in his right foot early on after he began exercising.  Wears CPAP at night.  Reflux as noted above.  All other systems reviewed and are negative.  EKGs/Labs/Other Studies Reviewed:    The following studies were reviewed today: No new cardiac imaging.  EKG:  EKG EKG is not repeated  Recent Labs: 11/30/2020: ALT 22 12/01/2020: Hemoglobin 12.1; Platelets  156 12/02/2020: BUN 12; Creatinine, Ser 0.81; Potassium 3.8; Sodium 132  Recent  Lipid Panel    Component Value Date/Time   CHOL 88 12/01/2020 0230   TRIG 163 (H) 12/01/2020 0230   HDL 24 (L) 12/01/2020 0230   CHOLHDL 3.7 12/01/2020 0230   VLDL 33 12/01/2020 0230   LDLCALC 31 12/01/2020 0230    Physical Exam:    VS:  BP (!) 114/58   Pulse 79   Ht 5' 11.5" (1.816 m)   Wt 230 lb 9.6 oz (104.6 kg)   SpO2 96%   BMI 31.71 kg/m     Wt Readings from Last 3 Encounters:  03/20/21 230 lb 9.6 oz (104.6 kg)  12/24/20 232 lb (105.2 kg)  12/18/20 227 lb (103 kg)     GEN: Abdominal obesity. No acute distress HEENT: Normal NECK: No JVD. LYMPHATICS: No lymphadenopathy CARDIAC: No significant murmur murmur. RRR S4 gallop. VASCULAR: Tense 2-3+ edema right foot to the level of the knee.  Edema is pitting.  Normal Pulses. No bruits. RESPIRATORY:  Clear to auscultation without rales, wheezing or rhonchi  ABDOMEN: Soft, non-tender, non-distended, No pulsatile mass, MUSCULOSKELETAL: No deformity  SKIN: Warm and dry NEUROLOGIC:  Alert and oriented x 3 PSYCHIATRIC:  Normal affect   ASSESSMENT:    1. Coronary artery disease involving coronary bypass graft of native heart with angina pectoris ([place])   2. S/P aortobifemoral bypass surgery   3. Bilateral extracranial carotid artery stenosis   4. PAF (paroxysmal atrial fibrillation) ([place])   5. Essential hypertension   6. Hyperlipidemia, unspecified hyperlipidemia type   7. OSA on CPAP   8. Type 2 diabetes mellitus with vascular disease ([name])    PLAN:    In order of problems listed above:  Coronary disease without unstable anginal symptoms.  Patient has not being physically active and is being hindered by swelling in his right lower extremity which had arterial revascularization in April. With follow-up with Dr. Sherren Mocha early concerning right lower extremity swelling. Doing well since TCAR of the right carotid in October 2021. No recent  episodes of atrial fibrillation apparent to the patient. Blood pressure control is excellent currently at 114/58.  Continue current therapy.   Continue high intensity statin therapy with goal LDL less than 70. Continue CPAP. Decrease carbohydrate intake.  Target A1c less than 7.   Overall education and awareness concerning primary/secondary risk prevention was discussed in detail: LDL less than 70, hemoglobin A1c less than 7, blood pressure target less than 130/80 mmHg, >150 minutes of moderate aerobic activity per week, avoidance of smoking, weight control (via diet and exercise), and continued surveillance/management of/for obstructive sleep apnea.   Unless he develops angina or symptoms of heart failure, we will plan to see the patient in 8 to 12 months.  When possible, after right lower extremity heals, begin a walking program   Medication Adjustments/Labs and Tests Ordered: Current medicines are reviewed at length with the patient today.  Concerns regarding medicines are outlined above.  No orders of the defined types were placed in this encounter.  No orders of the defined types were placed in this encounter.   Patient Instructions  Medication Instructions:  Your physician recommends that you continue on your current medications as directed. Please refer to the Current Medication list given to you today.  *If you need a refill on your cardiac medications before your next appointment, please call your pharmacy*   Lab Work: None ordered  If you have labs (blood work) drawn today and your tests are completely normal, you will receive your results  only by: [employer] (if you have MyChart) OR A paper copy in the mail If you have any lab test that is abnormal or we need to change your treatment, we will call you to review the results.   Testing/Procedures: None ordered   Follow-Up: At [facility], you and your health needs are our priority.  As part of our continuing  mission to provide you with exceptional heart care, we have created designated Provider Care Teams.  These Care Teams include your primary Cardiologist (physician) and Advanced Practice Providers (APPs -  Physician Assistants and Nurse Practitioners) who all work together to provide you with the care you need, when you need it.  We recommend signing up for the patient portal called "MyChart".  Sign up information is provided on this After Visit Summary.  MyChart is used to connect with patients for Virtual Visits (Telemedicine).  Patients are able to view lab/test results, encounter notes, upcoming appointments, etc.  Non-urgent messages can be sent to your provider as well.   To learn more about what you can do with MyChart, go to [email or web address].    Your next appointment:   8 month(s)  The format for your next appointment:   In Person  Provider:   Daneen Schick, MD   Other Instructions   Signed, Sinclair Grooms, MD  03/20/2021 2:00 PM    [place]

## 2021-03-20 ENCOUNTER — Ambulatory Visit: Payer: PPO | Admitting: Interventional Cardiology

## 2021-03-20 ENCOUNTER — Encounter: Payer: Self-pay | Admitting: Interventional Cardiology

## 2021-03-20 ENCOUNTER — Other Ambulatory Visit: Payer: Self-pay

## 2021-03-20 VITALS — BP 114/58 | HR 79 | Ht 71.5 in | Wt 230.6 lb

## 2021-03-20 DIAGNOSIS — I480 Paroxysmal atrial fibrillation: Secondary | ICD-10-CM | POA: Diagnosis not present

## 2021-03-20 DIAGNOSIS — Z95828 Presence of other vascular implants and grafts: Secondary | ICD-10-CM

## 2021-03-20 DIAGNOSIS — Z9989 Dependence on other enabling machines and devices: Secondary | ICD-10-CM

## 2021-03-20 DIAGNOSIS — I25709 Atherosclerosis of coronary artery bypass graft(s), unspecified, with unspecified angina pectoris: Secondary | ICD-10-CM

## 2021-03-20 DIAGNOSIS — E1159 Type 2 diabetes mellitus with other circulatory complications: Secondary | ICD-10-CM

## 2021-03-20 DIAGNOSIS — G4733 Obstructive sleep apnea (adult) (pediatric): Secondary | ICD-10-CM

## 2021-03-20 DIAGNOSIS — I6523 Occlusion and stenosis of bilateral carotid arteries: Secondary | ICD-10-CM

## 2021-03-20 DIAGNOSIS — E785 Hyperlipidemia, unspecified: Secondary | ICD-10-CM

## 2021-03-20 DIAGNOSIS — I10 Essential (primary) hypertension: Secondary | ICD-10-CM | POA: Diagnosis not present

## 2021-03-20 NOTE — Patient Instructions (Signed)
Medication Instructions:  Your physician recommends that you continue on your current medications as directed. Please refer to the Current Medication list given to you today.  *If you need a refill on your cardiac medications before your next appointment, please call your pharmacy*   Lab Work: None ordered  If you have labs (blood work) drawn today and your tests are completely normal, you will receive your results only by: [place] (if you have MyChart) OR A paper copy in the mail If you have any lab test that is abnormal or we need to change your treatment, we will call you to review the results.   Testing/Procedures: None ordered   Follow-Up: At [facility], you and your health needs are our priority.  As part of our continuing mission to provide you with exceptional heart care, we have created designated Provider Care Teams.  These Care Teams include your primary Cardiologist (physician) and Advanced Practice Providers (APPs -  Physician Assistants and Nurse Practitioners) who all work together to provide you with the care you need, when you need it.  We recommend signing up for the patient portal called "MyChart".  Sign up information is provided on this After Visit Summary.  MyChart is used to connect with patients for Virtual Visits (Telemedicine).  Patients are able to view lab/test results, encounter notes, upcoming appointments, etc.  Non-urgent messages can be sent to your provider as well.   To learn more about what you can do with MyChart, go to [email or web address].    Your next appointment:   8 month(s)  The format for your next appointment:   In Person  Provider:   Daneen Schick, MD   Other Instructions

## 2021-03-25 ENCOUNTER — Telehealth: Payer: Self-pay

## 2021-03-25 NOTE — Telephone Encounter (Signed)
Patient calls today to report persistent swelling since his bypass by Dr. Donnetta Hutching in April. He elevates his leg at night and it is better in the morning, but after about 30 minutes the swelling returns and lasts much of the day. He has gone to see dermatology recently and there is a place on his right leg that they would like to biopsy - they are reluctant because of RLE swelling and the fact that patient is a diabetic. Placed on schedule for evaluation.

## 2021-03-25 NOTE — Progress Notes (Unsigned)
[facility] [place] PROGRESS NOTE  ASSESSMENT / PLAN: Bradley [name] is a 75 y.o. male status post #1 right femoral endarterectomy and Dacron patch angioplasty onto the profundus femoris artery.  #2 right femoral to above-knee popliteal bypass with 6 mm Gore-Tex graft on 11/30/20.  Patient concerned about swelling.  I counseled the patient that swelling may persist after revascularization for months.  Conservative measures are best including compression and elevation.  We will measure his ABI.  If his toe pressure is above 60 mmHg, he can proceed with dermatologic biopsy at any point.  SUBJECTIVE: ***  OBJECTIVE: There were no vitals taken for this visit. @INTAKEOUTPUTBRIEF @  Urine output over past 24 hours: ***  Constitutional: *** appearing. *** acute distress. CNS: *** Cardiac: ***. Pulmonary: *** Abdomen: *** Vascular: ***  CBC Latest Ref Rng & Units 12/01/2020 11/30/2020 11/30/2020  WBC 4.0 - 10.5 K/uL 7.2 11.0(H) 7.4  Hemoglobin 13.0 - 17.0 g/dL 12.1(L) 11.9(L) 13.3  Hematocrit 39.0 - 52.0 % 38.4(L) 37.4(L) 42.7  Platelets 150 - 400 K/uL 156 170 222     CMP Latest Ref Rng & Units 12/02/2020 12/01/2020 11/30/2020  Glucose 70 - 99 mg/dL 161(H) 208(H) -  BUN 8 - 23 mg/dL 12 29(H) -  Creatinine 0.61 - 1.24 mg/dL 0.81 2.72(H) 2.60(H)  Sodium 135 - 145 mmol/L 132(L) 138 -  Potassium 3.5 - 5.1 mmol/L 3.8 3.7 -  Chloride 98 - 111 mmol/L 101 102 -  CO2 22 - 32 mmol/L 24 28 -  Calcium 8.9 - 10.3 mg/dL 8.9 8.3(L) -  Total Protein 6.5 - 8.1 g/dL - - -  Total Bilirubin 0.3 - 1.2 mg/dL - - -  Alkaline Phos 38 - 126 U/L - - -  AST 15 - 41 U/L - - -  ALT 0 - 44 U/L - - -   ABI 03/26/21 ***  Bradley Eimers N. Stanford Breed, MD [facility] Phone Number: [phone] 03/25/2021 5:57 PM

## 2021-03-26 ENCOUNTER — Other Ambulatory Visit: Payer: Self-pay

## 2021-03-26 ENCOUNTER — Ambulatory Visit: Payer: PPO | Admitting: Vascular Surgery

## 2021-03-26 ENCOUNTER — Ambulatory Visit (INDEPENDENT_AMBULATORY_CARE_PROVIDER_SITE_OTHER): Payer: PPO | Admitting: Vascular Surgery

## 2021-03-26 ENCOUNTER — Ambulatory Visit (HOSPITAL_COMMUNITY)
Admission: RE | Admit: 2021-03-26 | Discharge: 2021-03-26 | Disposition: A | Discharge status: Home / Self Care | Payer: PPO | Source: Ambulatory Visit | Attending: Vascular Surgery | Admitting: Vascular Surgery

## 2021-03-26 ENCOUNTER — Encounter: Payer: Self-pay | Admitting: Vascular Surgery

## 2021-03-26 VITALS — BP 143/77 | HR 79 | Temp 98.6°F | Resp 20 | Ht 71.5 in | Wt 230.0 lb

## 2021-03-26 DIAGNOSIS — I739 Peripheral vascular disease, unspecified: Secondary | ICD-10-CM | POA: Diagnosis not present

## 2021-03-26 DIAGNOSIS — I70213 Atherosclerosis of native arteries of extremities with intermittent claudication, bilateral legs: Secondary | ICD-10-CM | POA: Diagnosis not present

## 2021-03-26 NOTE — Progress Notes (Signed)
[facility] [place] PROGRESS NOTE  ASSESSMENT / PLAN: Bradley [name] is a 75 y.o. male status post right right femoral above-knee popliteal bypass and right common femoral endarterectomy on 11/30/2020.  He has persistent swelling in his right lower extremity.  I counseled him this could take months to resolve, and is typical of patients after peripheral bypass.  He has an area of likely squamous cell carcinoma in his right lateral calf which needs to be biopsied.  I think it is safe to proceed given his normalization of ankle and toe pressures on today's ABI.  We will see him again in 3-6 months for surveillance of bypass.  SUBJECTIVE: Reports right leg is persistently swollen.  This is bothersome to him.  He has an area on his right lateral calf which needs to be biopsied by dermatology.  He is concerned that a biopsy may create an nonhealing wound.  OBJECTIVE: BP (!) 143/77 (BP Location: Left Arm, Patient Position: Sitting, Cuff Size: Large)   Pulse 79   Temp 98.6 F (37 C)   Resp 20   Ht 5' 11.5" (1.816 m)   Wt 230 lb (104.3 kg)   SpO2 98%   BMI 31.63 kg/m   No acute distress Regular rate and rhythm Unlabored breathing Soft, obese abdomen Right leg 1-2+ edema from midfoot to knee Right foot warm and well-perfused Right lateral calf ulcerated lesion  CBC Latest Ref Rng & Units 12/01/2020 11/30/2020 11/30/2020  WBC 4.0 - 10.5 K/uL 7.2 11.0(H) 7.4  Hemoglobin 13.0 - 17.0 g/dL 12.1(L) 11.9(L) 13.3  Hematocrit 39.0 - 52.0 % 38.4(L) 37.4(L) 42.7  Platelets 150 - 400 K/uL 156 170 222     CMP Latest Ref Rng & Units 12/02/2020 12/01/2020 11/30/2020  Glucose 70 - 99 mg/dL 161(H) 208(H) -  BUN 8 - 23 mg/dL 12 29(H) -  Creatinine 0.61 - 1.24 mg/dL 0.81 2.72(H) 2.60(H)  Sodium 135 - 145 mmol/L 132(L) 138 -  Potassium 3.5 - 5.1 mmol/L 3.8 3.7 -  Chloride 98 - 111 mmol/L 101 102 -  CO2 22 - 32 mmol/L 24 28 -  Calcium 8.9 - 10.3 mg/dL 8.9 8.3(L) -  Total Protein 6.5  - 8.1 g/dL - - -  Total Bilirubin 0.3 - 1.2 mg/dL - - -  Alkaline Phos 38 - 126 U/L - - -  AST 15 - 41 U/L - - -  ALT 0 - 44 U/L - - -    LOWER EXTREMITY DOPPLER STUDY   Patient Name:  Bradley [name]  Date of Exam:   03/26/2021  Medical Rec #: [id number]        Accession #:    [id number]  Date of Birth: 08/17/1945        Patient Gender: M  Patient Age:   62 years  Exam Location:  [facility]  Procedure:      VAS [place] ABI WITH/WO TBI  Referring Phys: Jamelle Haring    ---------------------------------------------------------------------------  -----     Indications: PAD, Right lower extremity edema.   High Risk Factors: Hypertension, hyperlipidemia, past history of smoking.     Vascular Interventions: Right TCAR 06/01/2020. Right femoral to above  knee                          popliteal bypass graft 11/30/2020   Comparison Study: Increased post operative right ABI/   Performing Technologist: Alvia Grove RVT  Examination Guidelines: A complete evaluation includes at minimum, Doppler  waveform signals and systolic blood pressure reading at the level of  bilateral  brachial, anterior tibial, and posterior tibial arteries, when vessel  segments  are accessible. Bilateral testing is considered an integral part of a  complete  examination. Photoelectric Plethysmograph (PPG) waveforms and toe systolic  pressure readings are included as required and additional duplex testing  as  needed. Limited examinations for reoccurring indications may be performed  as  noted.      ABI Findings:  +---------+------------------+-----+----------+--------+  Right    Rt Pressure (mmHg)IndexWaveform  Comment   +---------+------------------+-----+----------+--------+  Brachial 142                                        +---------+------------------+-----+----------+--------+  PTA      154               1.06 biphasic             +---------+------------------+-----+----------+--------+  DP       132               0.91 monophasicbrisk     +---------+------------------+-----+----------+--------+  Great Toe114               0.79 Normal              +---------+------------------+-----+----------+--------+   +---------+------------------+-----+----------+-------+  Left     Lt Pressure (mmHg)IndexWaveform  Comment  +---------+------------------+-----+----------+-------+  Brachial 145                                       +---------+------------------+-----+----------+-------+  PTA      108               0.74 monophasicbrisk    +---------+------------------+-----+----------+-------+  DP       101               0.70 monophasic         +---------+------------------+-----+----------+-------+  Great Toe82                0.57 Abnormal           +---------+------------------+-----+----------+-------+   +-------+-----------+-----------+------------+------------+  ABI/TBIToday's ABIToday's TBIPrevious ABIPrevious TBI  +-------+-----------+-----------+------------+------------+  Right  1.06       0.79       0.36        0.31          +-------+-----------+-----------+------------+------------+  Left   0.74       0.57       0.75        0.48          +-------+-----------+-----------+------------+------------+          Summary:  Right: Resting right ankle-brachial index is within normal range. No  evidence of significant right lower extremity arterial disease. The right  toe-brachial index is normal.   Left: Resting left ankle-brachial index indicates moderate left lower  extremity arterial disease. The left toe-brachial index is abnormal.       *See table(s) above for measurements and observations.       Electronically signed by Jamelle Haring on 03/26/2021 at 4:02:17 PM.  [name]. Stanford Breed, MD [facility] Phone  Number: [phone] 03/26/2021 4:33 PM

## 2021-03-27 ENCOUNTER — Other Ambulatory Visit: Payer: Self-pay

## 2021-03-27 DIAGNOSIS — I70213 Atherosclerosis of native arteries of extremities with intermittent claudication, bilateral legs: Secondary | ICD-10-CM

## 2021-04-02 DIAGNOSIS — M9901 Segmental and somatic dysfunction of cervical region: Secondary | ICD-10-CM | POA: Diagnosis not present

## 2021-04-02 DIAGNOSIS — M9902 Segmental and somatic dysfunction of thoracic region: Secondary | ICD-10-CM | POA: Diagnosis not present

## 2021-04-02 DIAGNOSIS — M5033 Other cervical disc degeneration, cervicothoracic region: Secondary | ICD-10-CM | POA: Diagnosis not present

## 2021-04-02 DIAGNOSIS — M5412 Radiculopathy, cervical region: Secondary | ICD-10-CM | POA: Diagnosis not present

## 2021-04-10 DIAGNOSIS — E1142 Type 2 diabetes mellitus with diabetic polyneuropathy: Secondary | ICD-10-CM | POA: Diagnosis not present

## 2021-04-11 DIAGNOSIS — D485 Neoplasm of uncertain behavior of skin: Secondary | ICD-10-CM | POA: Diagnosis not present

## 2021-04-11 DIAGNOSIS — D0461 Carcinoma in situ of skin of right upper limb, including shoulder: Secondary | ICD-10-CM | POA: Diagnosis not present

## 2021-04-11 DIAGNOSIS — C44629 Squamous cell carcinoma of skin of left upper limb, including shoulder: Secondary | ICD-10-CM | POA: Diagnosis not present

## 2021-04-11 DIAGNOSIS — C44622 Squamous cell carcinoma of skin of right upper limb, including shoulder: Secondary | ICD-10-CM | POA: Diagnosis not present

## 2021-04-16 DIAGNOSIS — M5033 Other cervical disc degeneration, cervicothoracic region: Secondary | ICD-10-CM | POA: Diagnosis not present

## 2021-04-16 DIAGNOSIS — M9901 Segmental and somatic dysfunction of cervical region: Secondary | ICD-10-CM | POA: Diagnosis not present

## 2021-04-16 DIAGNOSIS — M9902 Segmental and somatic dysfunction of thoracic region: Secondary | ICD-10-CM | POA: Diagnosis not present

## 2021-04-16 DIAGNOSIS — M5412 Radiculopathy, cervical region: Secondary | ICD-10-CM | POA: Diagnosis not present

## 2021-04-19 DIAGNOSIS — K219 Gastro-esophageal reflux disease without esophagitis: Secondary | ICD-10-CM | POA: Diagnosis not present

## 2021-04-19 DIAGNOSIS — I10 Essential (primary) hypertension: Secondary | ICD-10-CM | POA: Diagnosis not present

## 2021-04-19 DIAGNOSIS — I4891 Unspecified atrial fibrillation: Secondary | ICD-10-CM | POA: Diagnosis not present

## 2021-04-19 DIAGNOSIS — E1165 Type 2 diabetes mellitus with hyperglycemia: Secondary | ICD-10-CM | POA: Diagnosis not present

## 2021-04-19 DIAGNOSIS — E7800 Pure hypercholesterolemia, unspecified: Secondary | ICD-10-CM | POA: Diagnosis not present

## 2021-04-19 DIAGNOSIS — N184 Chronic kidney disease, stage 4 (severe): Secondary | ICD-10-CM | POA: Diagnosis not present

## 2021-04-19 DIAGNOSIS — M8580 Other specified disorders of bone density and structure, unspecified site: Secondary | ICD-10-CM | POA: Diagnosis not present

## 2021-04-19 DIAGNOSIS — I2510 Atherosclerotic heart disease of native coronary artery without angina pectoris: Secondary | ICD-10-CM | POA: Diagnosis not present

## 2021-04-19 DIAGNOSIS — E1142 Type 2 diabetes mellitus with diabetic polyneuropathy: Secondary | ICD-10-CM | POA: Diagnosis not present

## 2021-05-01 DIAGNOSIS — M9902 Segmental and somatic dysfunction of thoracic region: Secondary | ICD-10-CM | POA: Diagnosis not present

## 2021-05-01 DIAGNOSIS — M9901 Segmental and somatic dysfunction of cervical region: Secondary | ICD-10-CM | POA: Diagnosis not present

## 2021-05-01 DIAGNOSIS — M5033 Other cervical disc degeneration, cervicothoracic region: Secondary | ICD-10-CM | POA: Diagnosis not present

## 2021-05-01 DIAGNOSIS — M5412 Radiculopathy, cervical region: Secondary | ICD-10-CM | POA: Diagnosis not present

## 2021-05-02 DIAGNOSIS — E1142 Type 2 diabetes mellitus with diabetic polyneuropathy: Secondary | ICD-10-CM | POA: Diagnosis not present

## 2021-05-02 DIAGNOSIS — E041 Nontoxic single thyroid nodule: Secondary | ICD-10-CM | POA: Diagnosis not present

## 2021-05-02 DIAGNOSIS — I10 Essential (primary) hypertension: Secondary | ICD-10-CM | POA: Diagnosis not present

## 2021-05-02 DIAGNOSIS — E1165 Type 2 diabetes mellitus with hyperglycemia: Secondary | ICD-10-CM | POA: Diagnosis not present

## 2021-05-02 DIAGNOSIS — I2510 Atherosclerotic heart disease of native coronary artery without angina pectoris: Secondary | ICD-10-CM | POA: Diagnosis not present

## 2021-05-02 DIAGNOSIS — Z794 Long term (current) use of insulin: Secondary | ICD-10-CM | POA: Diagnosis not present

## 2021-05-06 DIAGNOSIS — E785 Hyperlipidemia, unspecified: Secondary | ICD-10-CM | POA: Diagnosis not present

## 2021-05-06 DIAGNOSIS — N179 Acute kidney failure, unspecified: Secondary | ICD-10-CM | POA: Diagnosis not present

## 2021-05-06 DIAGNOSIS — I129 Hypertensive chronic kidney disease with stage 1 through stage 4 chronic kidney disease, or unspecified chronic kidney disease: Secondary | ICD-10-CM | POA: Diagnosis not present

## 2021-05-06 DIAGNOSIS — I639 Cerebral infarction, unspecified: Secondary | ICD-10-CM | POA: Diagnosis not present

## 2021-05-06 DIAGNOSIS — E871 Hypo-osmolality and hyponatremia: Secondary | ICD-10-CM | POA: Diagnosis not present

## 2021-05-06 DIAGNOSIS — I6529 Occlusion and stenosis of unspecified carotid artery: Secondary | ICD-10-CM | POA: Diagnosis not present

## 2021-05-06 DIAGNOSIS — E1122 Type 2 diabetes mellitus with diabetic chronic kidney disease: Secondary | ICD-10-CM | POA: Diagnosis not present

## 2021-05-06 DIAGNOSIS — I2510 Atherosclerotic heart disease of native coronary artery without angina pectoris: Secondary | ICD-10-CM | POA: Diagnosis not present

## 2021-05-06 DIAGNOSIS — G4733 Obstructive sleep apnea (adult) (pediatric): Secondary | ICD-10-CM | POA: Diagnosis not present

## 2021-05-06 DIAGNOSIS — N183 Chronic kidney disease, stage 3 unspecified: Secondary | ICD-10-CM | POA: Diagnosis not present

## 2021-05-06 DIAGNOSIS — R809 Proteinuria, unspecified: Secondary | ICD-10-CM | POA: Diagnosis not present

## 2021-05-10 DIAGNOSIS — E1142 Type 2 diabetes mellitus with diabetic polyneuropathy: Secondary | ICD-10-CM | POA: Diagnosis not present

## 2021-05-14 DIAGNOSIS — M9902 Segmental and somatic dysfunction of thoracic region: Secondary | ICD-10-CM | POA: Diagnosis not present

## 2021-05-14 DIAGNOSIS — M5412 Radiculopathy, cervical region: Secondary | ICD-10-CM | POA: Diagnosis not present

## 2021-05-14 DIAGNOSIS — M5033 Other cervical disc degeneration, cervicothoracic region: Secondary | ICD-10-CM | POA: Diagnosis not present

## 2021-05-14 DIAGNOSIS — M9901 Segmental and somatic dysfunction of cervical region: Secondary | ICD-10-CM | POA: Diagnosis not present

## 2021-05-16 DIAGNOSIS — D0462 Carcinoma in situ of skin of left upper limb, including shoulder: Secondary | ICD-10-CM | POA: Diagnosis not present

## 2021-05-16 DIAGNOSIS — C44629 Squamous cell carcinoma of skin of left upper limb, including shoulder: Secondary | ICD-10-CM | POA: Diagnosis not present

## 2021-05-28 DIAGNOSIS — M5412 Radiculopathy, cervical region: Secondary | ICD-10-CM | POA: Diagnosis not present

## 2021-05-28 DIAGNOSIS — M9902 Segmental and somatic dysfunction of thoracic region: Secondary | ICD-10-CM | POA: Diagnosis not present

## 2021-05-28 DIAGNOSIS — M9901 Segmental and somatic dysfunction of cervical region: Secondary | ICD-10-CM | POA: Diagnosis not present

## 2021-05-28 DIAGNOSIS — M5033 Other cervical disc degeneration, cervicothoracic region: Secondary | ICD-10-CM | POA: Diagnosis not present

## 2021-05-29 DIAGNOSIS — E7800 Pure hypercholesterolemia, unspecified: Secondary | ICD-10-CM | POA: Diagnosis not present

## 2021-05-29 DIAGNOSIS — Z125 Encounter for screening for malignant neoplasm of prostate: Secondary | ICD-10-CM | POA: Diagnosis not present

## 2021-05-29 DIAGNOSIS — E041 Nontoxic single thyroid nodule: Secondary | ICD-10-CM | POA: Diagnosis not present

## 2021-05-29 DIAGNOSIS — Z79899 Other long term (current) drug therapy: Secondary | ICD-10-CM | POA: Diagnosis not present

## 2021-05-29 DIAGNOSIS — E291 Testicular hypofunction: Secondary | ICD-10-CM | POA: Diagnosis not present

## 2021-05-29 DIAGNOSIS — I10 Essential (primary) hypertension: Secondary | ICD-10-CM | POA: Diagnosis not present

## 2021-05-30 DIAGNOSIS — C44722 Squamous cell carcinoma of skin of right lower limb, including hip: Secondary | ICD-10-CM | POA: Diagnosis not present

## 2021-05-31 ENCOUNTER — Encounter: Payer: Self-pay | Admitting: Podiatry

## 2021-05-31 ENCOUNTER — Other Ambulatory Visit: Payer: Self-pay

## 2021-05-31 ENCOUNTER — Ambulatory Visit: Payer: PPO | Admitting: Podiatry

## 2021-05-31 DIAGNOSIS — D689 Coagulation defect, unspecified: Secondary | ICD-10-CM | POA: Diagnosis not present

## 2021-05-31 DIAGNOSIS — E1159 Type 2 diabetes mellitus with other circulatory complications: Secondary | ICD-10-CM | POA: Diagnosis not present

## 2021-05-31 NOTE — Progress Notes (Signed)
This patient presents the office for his annual diabetic foot exam.  This patient has been diagnosed with  PAD and diabetes with vascular disease previously.  He says he has had vascular surgery to improve his vascularity right foot.  He also says he has broken his right foot.  Finally he has skin surgery in his right leg.   Patient does give a history of heart surgery and kidney disease.  Patient is taking insulin and eliquis.Marland Kitchen  He presents the office today for an evaluation of his diabetic feet   Vascular  Dorsalis pedis and posterior tibial pulses are weakly   palpable right foot.  Dorsalis pedis and posterior tibial pulses left foot are weakly palpable.   Capillary return  WNL.  Temperature gradient is  WNL.  Skin turgor  WNL  Sensorium  Senn Weinstein monofilament wire  WNL. Normal tactile sensation.  Nail Exam  Patient has normal nails with no evidence of bacterial or fungal infection.  Orthopedic  Exam  Muscle tone and muscle strength  WNL.  No limitations of motion feet  B/L.  No crepitus or joint effusion noted.  Foot type is unremarkable and digits show no abnormalities.  Bony prominences are unremarkable.  Increased temperature and swelling right foot.    Skin  No open lesions.  Normal skin texture and turgor..  Diabetes with vascular disease.  His diabetic foot exam reveals vascular disease  of pedal pulses.  No evidence of any neuropathy feet bilaterally.  I am concerned about his right foot which has significant swelling and increased temperature right foot.  Told him to check with his doctors concerning the swelling.  He wears compression socks .   RTC 1 year   Gardiner Barefoot DPM

## 2021-06-04 DIAGNOSIS — M5412 Radiculopathy, cervical region: Secondary | ICD-10-CM | POA: Diagnosis not present

## 2021-06-04 DIAGNOSIS — M9903 Segmental and somatic dysfunction of lumbar region: Secondary | ICD-10-CM | POA: Diagnosis not present

## 2021-06-04 DIAGNOSIS — M9901 Segmental and somatic dysfunction of cervical region: Secondary | ICD-10-CM | POA: Diagnosis not present

## 2021-06-04 DIAGNOSIS — M5033 Other cervical disc degeneration, cervicothoracic region: Secondary | ICD-10-CM | POA: Diagnosis not present

## 2021-06-05 DIAGNOSIS — M25559 Pain in unspecified hip: Secondary | ICD-10-CM | POA: Diagnosis not present

## 2021-06-05 DIAGNOSIS — I10 Essential (primary) hypertension: Secondary | ICD-10-CM | POA: Diagnosis not present

## 2021-06-05 DIAGNOSIS — E1142 Type 2 diabetes mellitus with diabetic polyneuropathy: Secondary | ICD-10-CM | POA: Diagnosis not present

## 2021-06-05 DIAGNOSIS — Z125 Encounter for screening for malignant neoplasm of prostate: Secondary | ICD-10-CM | POA: Diagnosis not present

## 2021-06-05 DIAGNOSIS — Z23 Encounter for immunization: Secondary | ICD-10-CM | POA: Diagnosis not present

## 2021-06-05 DIAGNOSIS — E7800 Pure hypercholesterolemia, unspecified: Secondary | ICD-10-CM | POA: Diagnosis not present

## 2021-06-05 DIAGNOSIS — Z0000 Encounter for general adult medical examination without abnormal findings: Secondary | ICD-10-CM | POA: Diagnosis not present

## 2021-06-05 DIAGNOSIS — M6289 Other specified disorders of muscle: Secondary | ICD-10-CM | POA: Diagnosis not present

## 2021-06-05 DIAGNOSIS — E291 Testicular hypofunction: Secondary | ICD-10-CM | POA: Diagnosis not present

## 2021-06-05 DIAGNOSIS — K219 Gastro-esophageal reflux disease without esophagitis: Secondary | ICD-10-CM | POA: Diagnosis not present

## 2021-06-05 DIAGNOSIS — D6869 Other thrombophilia: Secondary | ICD-10-CM | POA: Diagnosis not present

## 2021-06-10 DIAGNOSIS — E1142 Type 2 diabetes mellitus with diabetic polyneuropathy: Secondary | ICD-10-CM | POA: Diagnosis not present

## 2021-06-11 DIAGNOSIS — M5033 Other cervical disc degeneration, cervicothoracic region: Secondary | ICD-10-CM | POA: Diagnosis not present

## 2021-06-11 DIAGNOSIS — M5412 Radiculopathy, cervical region: Secondary | ICD-10-CM | POA: Diagnosis not present

## 2021-06-11 DIAGNOSIS — M9903 Segmental and somatic dysfunction of lumbar region: Secondary | ICD-10-CM | POA: Diagnosis not present

## 2021-06-11 DIAGNOSIS — M9901 Segmental and somatic dysfunction of cervical region: Secondary | ICD-10-CM | POA: Diagnosis not present

## 2021-06-12 DIAGNOSIS — M5442 Lumbago with sciatica, left side: Secondary | ICD-10-CM | POA: Diagnosis not present

## 2021-06-13 DIAGNOSIS — M4726 Other spondylosis with radiculopathy, lumbar region: Secondary | ICD-10-CM | POA: Diagnosis not present

## 2021-06-17 ENCOUNTER — Ambulatory Visit
Admission: RE | Admit: 2021-06-17 | Discharge: 2021-06-17 | Disposition: A | Discharge status: Home / Self Care | Payer: PPO | Source: Ambulatory Visit | Attending: Internal Medicine | Admitting: Internal Medicine

## 2021-06-17 DIAGNOSIS — E041 Nontoxic single thyroid nodule: Secondary | ICD-10-CM

## 2021-06-17 DIAGNOSIS — M4726 Other spondylosis with radiculopathy, lumbar region: Secondary | ICD-10-CM | POA: Diagnosis not present

## 2021-06-18 DIAGNOSIS — M9903 Segmental and somatic dysfunction of lumbar region: Secondary | ICD-10-CM | POA: Diagnosis not present

## 2021-06-18 DIAGNOSIS — M5412 Radiculopathy, cervical region: Secondary | ICD-10-CM | POA: Diagnosis not present

## 2021-06-18 DIAGNOSIS — M9901 Segmental and somatic dysfunction of cervical region: Secondary | ICD-10-CM | POA: Diagnosis not present

## 2021-06-18 DIAGNOSIS — M5033 Other cervical disc degeneration, cervicothoracic region: Secondary | ICD-10-CM | POA: Diagnosis not present

## 2021-06-20 DIAGNOSIS — M4726 Other spondylosis with radiculopathy, lumbar region: Secondary | ICD-10-CM | POA: Diagnosis not present

## 2021-06-24 DIAGNOSIS — M4726 Other spondylosis with radiculopathy, lumbar region: Secondary | ICD-10-CM | POA: Diagnosis not present

## 2021-06-26 DIAGNOSIS — M5033 Other cervical disc degeneration, cervicothoracic region: Secondary | ICD-10-CM | POA: Diagnosis not present

## 2021-06-26 DIAGNOSIS — M9901 Segmental and somatic dysfunction of cervical region: Secondary | ICD-10-CM | POA: Diagnosis not present

## 2021-06-26 DIAGNOSIS — M5412 Radiculopathy, cervical region: Secondary | ICD-10-CM | POA: Diagnosis not present

## 2021-06-26 DIAGNOSIS — M9903 Segmental and somatic dysfunction of lumbar region: Secondary | ICD-10-CM | POA: Diagnosis not present

## 2021-06-28 DIAGNOSIS — M4726 Other spondylosis with radiculopathy, lumbar region: Secondary | ICD-10-CM | POA: Diagnosis not present

## 2021-07-01 ENCOUNTER — Other Ambulatory Visit: Payer: Self-pay

## 2021-07-01 DIAGNOSIS — M4726 Other spondylosis with radiculopathy, lumbar region: Secondary | ICD-10-CM | POA: Diagnosis not present

## 2021-07-01 MED ORDER — HYDRALAZINE HCL 25 MG PO TABS: 25.0000 mg | ORAL_TABLET | Freq: Two times a day (BID) | ORAL | 2 refills | Status: DC

## 2021-07-02 DIAGNOSIS — M5033 Other cervical disc degeneration, cervicothoracic region: Secondary | ICD-10-CM | POA: Diagnosis not present

## 2021-07-02 DIAGNOSIS — M9901 Segmental and somatic dysfunction of cervical region: Secondary | ICD-10-CM | POA: Diagnosis not present

## 2021-07-02 DIAGNOSIS — M5412 Radiculopathy, cervical region: Secondary | ICD-10-CM | POA: Diagnosis not present

## 2021-07-02 DIAGNOSIS — M9903 Segmental and somatic dysfunction of lumbar region: Secondary | ICD-10-CM | POA: Diagnosis not present

## 2021-07-03 DIAGNOSIS — M4726 Other spondylosis with radiculopathy, lumbar region: Secondary | ICD-10-CM | POA: Diagnosis not present

## 2021-07-09 DIAGNOSIS — M4726 Other spondylosis with radiculopathy, lumbar region: Secondary | ICD-10-CM | POA: Diagnosis not present

## 2021-07-09 DIAGNOSIS — M5033 Other cervical disc degeneration, cervicothoracic region: Secondary | ICD-10-CM | POA: Diagnosis not present

## 2021-07-09 DIAGNOSIS — M9903 Segmental and somatic dysfunction of lumbar region: Secondary | ICD-10-CM | POA: Diagnosis not present

## 2021-07-09 DIAGNOSIS — M5412 Radiculopathy, cervical region: Secondary | ICD-10-CM | POA: Diagnosis not present

## 2021-07-09 DIAGNOSIS — M9901 Segmental and somatic dysfunction of cervical region: Secondary | ICD-10-CM | POA: Diagnosis not present

## 2021-07-10 DIAGNOSIS — E1142 Type 2 diabetes mellitus with diabetic polyneuropathy: Secondary | ICD-10-CM | POA: Diagnosis not present

## 2021-07-12 DIAGNOSIS — I129 Hypertensive chronic kidney disease with stage 1 through stage 4 chronic kidney disease, or unspecified chronic kidney disease: Secondary | ICD-10-CM | POA: Diagnosis not present

## 2021-07-12 DIAGNOSIS — R39198 Other difficulties with micturition: Secondary | ICD-10-CM | POA: Diagnosis not present

## 2021-07-12 DIAGNOSIS — I6529 Occlusion and stenosis of unspecified carotid artery: Secondary | ICD-10-CM | POA: Diagnosis not present

## 2021-07-12 DIAGNOSIS — E1122 Type 2 diabetes mellitus with diabetic chronic kidney disease: Secondary | ICD-10-CM | POA: Diagnosis not present

## 2021-07-12 DIAGNOSIS — I2510 Atherosclerotic heart disease of native coronary artery without angina pectoris: Secondary | ICD-10-CM | POA: Diagnosis not present

## 2021-07-12 DIAGNOSIS — I639 Cerebral infarction, unspecified: Secondary | ICD-10-CM | POA: Diagnosis not present

## 2021-07-12 DIAGNOSIS — N183 Chronic kidney disease, stage 3 unspecified: Secondary | ICD-10-CM | POA: Diagnosis not present

## 2021-07-12 DIAGNOSIS — N179 Acute kidney failure, unspecified: Secondary | ICD-10-CM | POA: Diagnosis not present

## 2021-07-12 DIAGNOSIS — E871 Hypo-osmolality and hyponatremia: Secondary | ICD-10-CM | POA: Diagnosis not present

## 2021-07-12 DIAGNOSIS — G4733 Obstructive sleep apnea (adult) (pediatric): Secondary | ICD-10-CM | POA: Diagnosis not present

## 2021-07-12 DIAGNOSIS — E785 Hyperlipidemia, unspecified: Secondary | ICD-10-CM | POA: Diagnosis not present

## 2021-07-12 DIAGNOSIS — R809 Proteinuria, unspecified: Secondary | ICD-10-CM | POA: Diagnosis not present

## 2021-07-17 ENCOUNTER — Other Ambulatory Visit: Payer: Self-pay

## 2021-07-17 ENCOUNTER — Other Ambulatory Visit: Payer: Self-pay | Admitting: Nephrology

## 2021-07-17 DIAGNOSIS — N183 Chronic kidney disease, stage 3 unspecified: Secondary | ICD-10-CM

## 2021-07-17 MED ORDER — METOPROLOL TARTRATE 50 MG PO TABS: 75.0000 mg | ORAL_TABLET | Freq: Two times a day (BID) | ORAL | 2 refills | Status: DC

## 2021-07-25 DIAGNOSIS — J0140 Acute pansinusitis, unspecified: Secondary | ICD-10-CM | POA: Diagnosis not present

## 2021-07-25 DIAGNOSIS — J069 Acute upper respiratory infection, unspecified: Secondary | ICD-10-CM | POA: Diagnosis not present

## 2021-07-25 DIAGNOSIS — R0981 Nasal congestion: Secondary | ICD-10-CM | POA: Diagnosis not present

## 2021-08-01 DIAGNOSIS — M25519 Pain in unspecified shoulder: Secondary | ICD-10-CM | POA: Diagnosis not present

## 2021-08-01 DIAGNOSIS — U071 COVID-19: Secondary | ICD-10-CM | POA: Diagnosis not present

## 2021-08-06 ENCOUNTER — Other Ambulatory Visit: Payer: PPO

## 2021-08-09 DIAGNOSIS — E1142 Type 2 diabetes mellitus with diabetic polyneuropathy: Secondary | ICD-10-CM | POA: Diagnosis not present

## 2021-08-13 ENCOUNTER — Other Ambulatory Visit: Payer: Self-pay

## 2021-08-13 ENCOUNTER — Other Ambulatory Visit: Payer: PPO

## 2021-08-13 DIAGNOSIS — M5033 Other cervical disc degeneration, cervicothoracic region: Secondary | ICD-10-CM | POA: Diagnosis not present

## 2021-08-13 DIAGNOSIS — M9903 Segmental and somatic dysfunction of lumbar region: Secondary | ICD-10-CM | POA: Diagnosis not present

## 2021-08-13 DIAGNOSIS — M5412 Radiculopathy, cervical region: Secondary | ICD-10-CM | POA: Diagnosis not present

## 2021-08-13 DIAGNOSIS — M9901 Segmental and somatic dysfunction of cervical region: Secondary | ICD-10-CM | POA: Diagnosis not present

## 2021-08-13 DIAGNOSIS — M67911 Unspecified disorder of synovium and tendon, right shoulder: Secondary | ICD-10-CM | POA: Diagnosis not present

## 2021-08-13 MED ORDER — APIXABAN 5 MG PO TABS: 5.0000 mg | ORAL_TABLET | Freq: Two times a day (BID) | ORAL | 1 refills | Status: DC

## 2021-08-13 NOTE — Telephone Encounter (Signed)
Pt last saw Dr Tamala Julian 03/20/21, last labs 12/02/20 Creat 0.81, age 76, weight 104.3kg, based on specified criteria pt is on appropriate dosage of Eliquis 5mg  BID for afib.  Will refill rx.

## 2021-08-20 DIAGNOSIS — M5412 Radiculopathy, cervical region: Secondary | ICD-10-CM | POA: Diagnosis not present

## 2021-08-20 DIAGNOSIS — M5033 Other cervical disc degeneration, cervicothoracic region: Secondary | ICD-10-CM | POA: Diagnosis not present

## 2021-08-20 DIAGNOSIS — M9903 Segmental and somatic dysfunction of lumbar region: Secondary | ICD-10-CM | POA: Diagnosis not present

## 2021-08-20 DIAGNOSIS — M9901 Segmental and somatic dysfunction of cervical region: Secondary | ICD-10-CM | POA: Diagnosis not present

## 2021-08-22 ENCOUNTER — Other Ambulatory Visit: Payer: PPO

## 2021-09-03 DIAGNOSIS — M9903 Segmental and somatic dysfunction of lumbar region: Secondary | ICD-10-CM | POA: Diagnosis not present

## 2021-09-03 DIAGNOSIS — M5033 Other cervical disc degeneration, cervicothoracic region: Secondary | ICD-10-CM | POA: Diagnosis not present

## 2021-09-03 DIAGNOSIS — M9901 Segmental and somatic dysfunction of cervical region: Secondary | ICD-10-CM | POA: Diagnosis not present

## 2021-09-03 DIAGNOSIS — M5412 Radiculopathy, cervical region: Secondary | ICD-10-CM | POA: Diagnosis not present

## 2021-09-03 DIAGNOSIS — M67911 Unspecified disorder of synovium and tendon, right shoulder: Secondary | ICD-10-CM | POA: Diagnosis not present

## 2021-09-04 ENCOUNTER — Ambulatory Visit
Admission: RE | Admit: 2021-09-04 | Discharge: 2021-09-04 | Disposition: A | Discharge status: Home / Self Care | Payer: PPO | Source: Ambulatory Visit | Attending: Nephrology | Admitting: Nephrology

## 2021-09-04 DIAGNOSIS — N183 Chronic kidney disease, stage 3 unspecified: Secondary | ICD-10-CM

## 2021-09-04 DIAGNOSIS — N189 Chronic kidney disease, unspecified: Secondary | ICD-10-CM | POA: Diagnosis not present

## 2021-09-04 DIAGNOSIS — N281 Cyst of kidney, acquired: Secondary | ICD-10-CM | POA: Diagnosis not present

## 2021-09-09 DIAGNOSIS — E1142 Type 2 diabetes mellitus with diabetic polyneuropathy: Secondary | ICD-10-CM | POA: Diagnosis not present

## 2021-09-11 DIAGNOSIS — D225 Melanocytic nevi of trunk: Secondary | ICD-10-CM | POA: Diagnosis not present

## 2021-09-11 DIAGNOSIS — D2271 Melanocytic nevi of right lower limb, including hip: Secondary | ICD-10-CM | POA: Diagnosis not present

## 2021-09-11 DIAGNOSIS — D2272 Melanocytic nevi of left lower limb, including hip: Secondary | ICD-10-CM | POA: Diagnosis not present

## 2021-09-11 DIAGNOSIS — C44329 Squamous cell carcinoma of skin of other parts of face: Secondary | ICD-10-CM | POA: Diagnosis not present

## 2021-09-11 DIAGNOSIS — L570 Actinic keratosis: Secondary | ICD-10-CM | POA: Diagnosis not present

## 2021-09-11 DIAGNOSIS — D2261 Melanocytic nevi of right upper limb, including shoulder: Secondary | ICD-10-CM | POA: Diagnosis not present

## 2021-09-11 DIAGNOSIS — D2262 Melanocytic nevi of left upper limb, including shoulder: Secondary | ICD-10-CM | POA: Diagnosis not present

## 2021-09-11 DIAGNOSIS — D485 Neoplasm of uncertain behavior of skin: Secondary | ICD-10-CM | POA: Diagnosis not present

## 2021-09-11 DIAGNOSIS — L821 Other seborrheic keratosis: Secondary | ICD-10-CM | POA: Diagnosis not present

## 2021-09-17 DIAGNOSIS — M9903 Segmental and somatic dysfunction of lumbar region: Secondary | ICD-10-CM | POA: Diagnosis not present

## 2021-09-17 DIAGNOSIS — M5033 Other cervical disc degeneration, cervicothoracic region: Secondary | ICD-10-CM | POA: Diagnosis not present

## 2021-09-17 DIAGNOSIS — M9901 Segmental and somatic dysfunction of cervical region: Secondary | ICD-10-CM | POA: Diagnosis not present

## 2021-09-17 DIAGNOSIS — M5412 Radiculopathy, cervical region: Secondary | ICD-10-CM | POA: Diagnosis not present

## 2021-09-20 DIAGNOSIS — K5900 Constipation, unspecified: Secondary | ICD-10-CM | POA: Diagnosis not present

## 2021-09-20 DIAGNOSIS — Z86010 Personal history of colonic polyps: Secondary | ICD-10-CM | POA: Diagnosis not present

## 2021-09-24 DIAGNOSIS — M67911 Unspecified disorder of synovium and tendon, right shoulder: Secondary | ICD-10-CM | POA: Diagnosis not present

## 2021-09-25 DIAGNOSIS — E1165 Type 2 diabetes mellitus with hyperglycemia: Secondary | ICD-10-CM | POA: Diagnosis not present

## 2021-09-25 DIAGNOSIS — E1142 Type 2 diabetes mellitus with diabetic polyneuropathy: Secondary | ICD-10-CM | POA: Diagnosis not present

## 2021-09-25 DIAGNOSIS — E7800 Pure hypercholesterolemia, unspecified: Secondary | ICD-10-CM | POA: Diagnosis not present

## 2021-09-25 DIAGNOSIS — I10 Essential (primary) hypertension: Secondary | ICD-10-CM | POA: Diagnosis not present

## 2021-09-25 DIAGNOSIS — N184 Chronic kidney disease, stage 4 (severe): Secondary | ICD-10-CM | POA: Diagnosis not present

## 2021-09-30 DIAGNOSIS — M25511 Pain in right shoulder: Secondary | ICD-10-CM | POA: Diagnosis not present

## 2021-10-01 DIAGNOSIS — M9903 Segmental and somatic dysfunction of lumbar region: Secondary | ICD-10-CM | POA: Diagnosis not present

## 2021-10-01 DIAGNOSIS — M5412 Radiculopathy, cervical region: Secondary | ICD-10-CM | POA: Diagnosis not present

## 2021-10-01 DIAGNOSIS — M9901 Segmental and somatic dysfunction of cervical region: Secondary | ICD-10-CM | POA: Diagnosis not present

## 2021-10-01 DIAGNOSIS — M5033 Other cervical disc degeneration, cervicothoracic region: Secondary | ICD-10-CM | POA: Diagnosis not present

## 2021-10-02 DIAGNOSIS — H04129 Dry eye syndrome of unspecified lacrimal gland: Secondary | ICD-10-CM | POA: Diagnosis not present

## 2021-10-02 DIAGNOSIS — E119 Type 2 diabetes mellitus without complications: Secondary | ICD-10-CM | POA: Diagnosis not present

## 2021-10-02 DIAGNOSIS — H02889 Meibomian gland dysfunction of unspecified eye, unspecified eyelid: Secondary | ICD-10-CM | POA: Diagnosis not present

## 2021-10-02 DIAGNOSIS — H1045 Other chronic allergic conjunctivitis: Secondary | ICD-10-CM | POA: Diagnosis not present

## 2021-10-02 DIAGNOSIS — H40013 Open angle with borderline findings, low risk, bilateral: Secondary | ICD-10-CM | POA: Diagnosis not present

## 2021-10-07 ENCOUNTER — Other Ambulatory Visit: Payer: Self-pay

## 2021-10-07 DIAGNOSIS — I6523 Occlusion and stenosis of bilateral carotid arteries: Secondary | ICD-10-CM

## 2021-10-07 DIAGNOSIS — I70213 Atherosclerosis of native arteries of extremities with intermittent claudication, bilateral legs: Secondary | ICD-10-CM

## 2021-10-08 ENCOUNTER — Telehealth: Payer: Self-pay | Admitting: Interventional Cardiology

## 2021-10-08 ENCOUNTER — Encounter (HOSPITAL_COMMUNITY): Payer: PPO

## 2021-10-08 ENCOUNTER — Ambulatory Visit: Payer: PPO | Admitting: Vascular Surgery

## 2021-10-08 ENCOUNTER — Other Ambulatory Visit: Payer: Self-pay | Admitting: *Deleted

## 2021-10-08 DIAGNOSIS — R0781 Pleurodynia: Secondary | ICD-10-CM | POA: Diagnosis not present

## 2021-10-08 DIAGNOSIS — M75111 Incomplete rotator cuff tear or rupture of right shoulder, not specified as traumatic: Secondary | ICD-10-CM | POA: Diagnosis not present

## 2021-10-08 DIAGNOSIS — M545 Low back pain, unspecified: Secondary | ICD-10-CM | POA: Diagnosis not present

## 2021-10-08 MED ORDER — TORSEMIDE 20 MG PO TABS: 20.0000 mg | ORAL_TABLET | Freq: Every day | ORAL | 3 refills | Status: AC

## 2021-10-08 MED ORDER — NITROGLYCERIN 0.3 MG SL SUBL: 0.3000 mg | SUBLINGUAL_TABLET | SUBLINGUAL | 0 refills | Status: AC | PRN

## 2021-10-08 MED ORDER — HYDRALAZINE HCL 25 MG PO TABS: 25.0000 mg | ORAL_TABLET | Freq: Two times a day (BID) | ORAL | 2 refills | Status: AC

## 2021-10-08 NOTE — Telephone Encounter (Signed)
°*  STAT* If patient is at the pharmacy, call can be transferred to refill team.   1. Which medications need to be refilled? (please list name of each medication and dose if known)  torsemide (DEMADEX) 20 MG tablet apixaban (ELIQUIS) 5 MG TABS tablet nitroGLYCERIN (NITROSTAT) 0.3 MG SL tablet hydrALAZINE (APRESOLINE) 25 MG tablet  2. Which pharmacy/location (including street and city if local pharmacy) is medication to be sent to? [facility] - [place], [place] - [place] [address]  3. Do they need a 30 day or 90 day supply? 90 with refills    Patient is transitioning from [facility] to [place] to get mail order service

## 2021-10-09 DIAGNOSIS — E1142 Type 2 diabetes mellitus with diabetic polyneuropathy: Secondary | ICD-10-CM | POA: Diagnosis not present

## 2021-10-14 NOTE — Progress Notes (Signed)
[facility] OF [place] ?PROGRESS NOTE ? ?ASSESSMENT / PLAN: ?Bradley [name] is a 76 y.o. male status post right right femoral above-knee popliteal bypass and right common femoral endarterectomy on 11/30/2020.  He has persistent swelling in his right lower extremity.  I counseled him this could take months to resolve, and is typical of patients after peripheral bypass.  He has an area of likely squamous cell carcinoma in his right lateral calf which needs to be biopsied.  I think it is safe to proceed given his normalization of ankle and toe pressures on today's ABI.  We will see him again in 3-6 months for surveillance of bypass. ? ?SUBJECTIVE: ?Reports right leg is persistently swollen.  This is bothersome to him.  He has an area on his right lateral calf which needs to be biopsied by dermatology.  He is concerned that a biopsy may create an nonhealing wound. ? ?OBJECTIVE: ?There were no vitals taken for this visit. ? ?No acute distress ?Regular rate and rhythm ?Unlabored breathing ?Soft, obese abdomen ?Right leg 1-2+ edema from midfoot to knee ?Right foot warm and well-perfused ?Right lateral calf ulcerated lesion ? ?CBC Latest Ref Rng & Units 12/01/2020 11/30/2020 11/30/2020  ?WBC 4.0 - 10.5 K/uL 7.2 11.0(H) 7.4  ?Hemoglobin 13.0 - 17.0 g/dL 12.1(L) 11.9(L) 13.3  ?Hematocrit 39.0 - 52.0 % 38.4(L) 37.4(L) 42.7  ?Platelets 150 - 400 K/uL 156 170 222  ?  ? ?CMP Latest Ref Rng & Units 12/02/2020 12/01/2020 11/30/2020  ?Glucose 70 - 99 mg/dL 161(H) 208(H) -  ?BUN 8 - 23 mg/dL 12 29(H) -  ?Creatinine 0.61 - 1.24 mg/dL 0.81 2.72(H) 2.60(H)  ?Sodium 135 - 145 mmol/L 132(L) 138 -  ?Potassium 3.5 - 5.1 mmol/L 3.8 3.7 -  ?Chloride 98 - 111 mmol/L 101 102 -  ?CO2 22 - 32 mmol/L 24 28 -  ?Calcium 8.9 - 10.3 mg/dL 8.9 8.3(L) -  ?Total Protein 6.5 - 8.1 g/dL - - -  ?Total Bilirubin 0.3 - 1.2 mg/dL - - -  ?Alkaline Phos 38 - 126 U/L - - -  ?AST 15 - 41 U/L - - -  ?ALT 0 - 44 U/L - - -   ? ? ?+-------+-----------+-----------+------------+------------+  ?ABI/TBIToday's ABIToday's TBIPrevious ABIPrevious TBI  ?+-------+-----------+-----------+------------+------------+  ?Right  1.33       0.79       1.06        0.79          ?+-------+-----------+-----------+------------+------------+  ?Left   0.71       0.54       0.74        0.57          ?+-------+-----------+-----------+------------+------------+  ? ?RLE arterial duplex unremarkable - bypass widely patent. ? ?Carotid duplex shows no evidence of significant stenosis ? ? ?[name]. Stanford Breed, MD ?[facility] [place] ?Office Phone Number: [phone] ?10/14/2021 8:02 PM ? ? ? ?

## 2021-10-15 ENCOUNTER — Encounter: Payer: Self-pay | Admitting: Vascular Surgery

## 2021-10-15 ENCOUNTER — Ambulatory Visit (INDEPENDENT_AMBULATORY_CARE_PROVIDER_SITE_OTHER)
Admission: RE | Admit: 2021-10-15 | Discharge: 2021-10-15 | Disposition: A | Discharge status: Home / Self Care | Payer: PPO | Source: Ambulatory Visit | Attending: Vascular Surgery | Admitting: Vascular Surgery

## 2021-10-15 ENCOUNTER — Other Ambulatory Visit: Payer: Self-pay

## 2021-10-15 ENCOUNTER — Ambulatory Visit (HOSPITAL_COMMUNITY)
Admission: RE | Admit: 2021-10-15 | Discharge: 2021-10-15 | Disposition: A | Discharge status: Home / Self Care | Payer: PPO | Source: Ambulatory Visit | Attending: Vascular Surgery | Admitting: Vascular Surgery

## 2021-10-15 ENCOUNTER — Ambulatory Visit: Payer: PPO | Admitting: Vascular Surgery

## 2021-10-15 VITALS — BP 125/72 | HR 86 | Temp 98.7°F | Resp 20 | Ht 71.5 in | Wt 230.0 lb

## 2021-10-15 DIAGNOSIS — I6523 Occlusion and stenosis of bilateral carotid arteries: Secondary | ICD-10-CM | POA: Insufficient documentation

## 2021-10-15 DIAGNOSIS — I70213 Atherosclerosis of native arteries of extremities with intermittent claudication, bilateral legs: Secondary | ICD-10-CM | POA: Insufficient documentation

## 2021-10-15 DIAGNOSIS — I739 Peripheral vascular disease, unspecified: Secondary | ICD-10-CM | POA: Diagnosis not present

## 2021-10-15 DIAGNOSIS — M9903 Segmental and somatic dysfunction of lumbar region: Secondary | ICD-10-CM | POA: Diagnosis not present

## 2021-10-15 DIAGNOSIS — R1084 Generalized abdominal pain: Secondary | ICD-10-CM | POA: Diagnosis not present

## 2021-10-15 DIAGNOSIS — M5033 Other cervical disc degeneration, cervicothoracic region: Secondary | ICD-10-CM | POA: Diagnosis not present

## 2021-10-15 DIAGNOSIS — M9901 Segmental and somatic dysfunction of cervical region: Secondary | ICD-10-CM | POA: Diagnosis not present

## 2021-10-15 DIAGNOSIS — K219 Gastro-esophageal reflux disease without esophagitis: Secondary | ICD-10-CM | POA: Diagnosis not present

## 2021-10-15 DIAGNOSIS — M5412 Radiculopathy, cervical region: Secondary | ICD-10-CM | POA: Diagnosis not present

## 2021-10-16 DIAGNOSIS — N179 Acute kidney failure, unspecified: Secondary | ICD-10-CM | POA: Diagnosis not present

## 2021-10-16 DIAGNOSIS — I639 Cerebral infarction, unspecified: Secondary | ICD-10-CM | POA: Diagnosis not present

## 2021-10-16 DIAGNOSIS — N183 Chronic kidney disease, stage 3 unspecified: Secondary | ICD-10-CM | POA: Diagnosis not present

## 2021-10-16 DIAGNOSIS — Z9989 Dependence on other enabling machines and devices: Secondary | ICD-10-CM | POA: Diagnosis not present

## 2021-10-16 DIAGNOSIS — N400 Enlarged prostate without lower urinary tract symptoms: Secondary | ICD-10-CM | POA: Diagnosis not present

## 2021-10-16 DIAGNOSIS — E785 Hyperlipidemia, unspecified: Secondary | ICD-10-CM | POA: Diagnosis not present

## 2021-10-16 DIAGNOSIS — E871 Hypo-osmolality and hyponatremia: Secondary | ICD-10-CM | POA: Diagnosis not present

## 2021-10-16 DIAGNOSIS — I6529 Occlusion and stenosis of unspecified carotid artery: Secondary | ICD-10-CM | POA: Diagnosis not present

## 2021-10-16 DIAGNOSIS — R39198 Other difficulties with micturition: Secondary | ICD-10-CM | POA: Diagnosis not present

## 2021-10-16 DIAGNOSIS — I129 Hypertensive chronic kidney disease with stage 1 through stage 4 chronic kidney disease, or unspecified chronic kidney disease: Secondary | ICD-10-CM | POA: Diagnosis not present

## 2021-10-16 DIAGNOSIS — I2510 Atherosclerotic heart disease of native coronary artery without angina pectoris: Secondary | ICD-10-CM | POA: Diagnosis not present

## 2021-10-16 DIAGNOSIS — E1122 Type 2 diabetes mellitus with diabetic chronic kidney disease: Secondary | ICD-10-CM | POA: Diagnosis not present

## 2021-10-16 DIAGNOSIS — R809 Proteinuria, unspecified: Secondary | ICD-10-CM | POA: Diagnosis not present

## 2021-10-29 DIAGNOSIS — M5412 Radiculopathy, cervical region: Secondary | ICD-10-CM | POA: Diagnosis not present

## 2021-10-29 DIAGNOSIS — M9901 Segmental and somatic dysfunction of cervical region: Secondary | ICD-10-CM | POA: Diagnosis not present

## 2021-10-29 DIAGNOSIS — M5033 Other cervical disc degeneration, cervicothoracic region: Secondary | ICD-10-CM | POA: Diagnosis not present

## 2021-10-29 DIAGNOSIS — M9903 Segmental and somatic dysfunction of lumbar region: Secondary | ICD-10-CM | POA: Diagnosis not present

## 2021-10-31 DIAGNOSIS — Z794 Long term (current) use of insulin: Secondary | ICD-10-CM | POA: Diagnosis not present

## 2021-10-31 DIAGNOSIS — I2510 Atherosclerotic heart disease of native coronary artery without angina pectoris: Secondary | ICD-10-CM | POA: Diagnosis not present

## 2021-10-31 DIAGNOSIS — E1142 Type 2 diabetes mellitus with diabetic polyneuropathy: Secondary | ICD-10-CM | POA: Diagnosis not present

## 2021-10-31 DIAGNOSIS — E041 Nontoxic single thyroid nodule: Secondary | ICD-10-CM | POA: Diagnosis not present

## 2021-10-31 DIAGNOSIS — I10 Essential (primary) hypertension: Secondary | ICD-10-CM | POA: Diagnosis not present

## 2021-11-04 DIAGNOSIS — E1142 Type 2 diabetes mellitus with diabetic polyneuropathy: Secondary | ICD-10-CM | POA: Diagnosis not present

## 2021-11-07 DIAGNOSIS — M25552 Pain in left hip: Secondary | ICD-10-CM | POA: Diagnosis not present

## 2021-11-11 DIAGNOSIS — E1142 Type 2 diabetes mellitus with diabetic polyneuropathy: Secondary | ICD-10-CM | POA: Diagnosis not present

## 2021-11-12 DIAGNOSIS — M9901 Segmental and somatic dysfunction of cervical region: Secondary | ICD-10-CM | POA: Diagnosis not present

## 2021-11-12 DIAGNOSIS — M5033 Other cervical disc degeneration, cervicothoracic region: Secondary | ICD-10-CM | POA: Diagnosis not present

## 2021-11-12 DIAGNOSIS — M5412 Radiculopathy, cervical region: Secondary | ICD-10-CM | POA: Diagnosis not present

## 2021-11-12 DIAGNOSIS — M9903 Segmental and somatic dysfunction of lumbar region: Secondary | ICD-10-CM | POA: Diagnosis not present

## 2021-11-19 DIAGNOSIS — M25652 Stiffness of left hip, not elsewhere classified: Secondary | ICD-10-CM | POA: Diagnosis not present

## 2021-11-19 DIAGNOSIS — M6281 Muscle weakness (generalized): Secondary | ICD-10-CM | POA: Diagnosis not present

## 2021-11-19 DIAGNOSIS — M25552 Pain in left hip: Secondary | ICD-10-CM | POA: Diagnosis not present

## 2021-11-26 DIAGNOSIS — M25652 Stiffness of left hip, not elsewhere classified: Secondary | ICD-10-CM | POA: Diagnosis not present

## 2021-11-26 DIAGNOSIS — M9903 Segmental and somatic dysfunction of lumbar region: Secondary | ICD-10-CM | POA: Diagnosis not present

## 2021-11-26 DIAGNOSIS — M9901 Segmental and somatic dysfunction of cervical region: Secondary | ICD-10-CM | POA: Diagnosis not present

## 2021-11-26 DIAGNOSIS — M5033 Other cervical disc degeneration, cervicothoracic region: Secondary | ICD-10-CM | POA: Diagnosis not present

## 2021-11-26 DIAGNOSIS — M25552 Pain in left hip: Secondary | ICD-10-CM | POA: Diagnosis not present

## 2021-11-26 DIAGNOSIS — M5412 Radiculopathy, cervical region: Secondary | ICD-10-CM | POA: Diagnosis not present

## 2021-11-26 DIAGNOSIS — M6281 Muscle weakness (generalized): Secondary | ICD-10-CM | POA: Diagnosis not present

## 2021-11-27 DIAGNOSIS — D2372 Other benign neoplasm of skin of left lower limb, including hip: Secondary | ICD-10-CM | POA: Diagnosis not present

## 2021-11-27 DIAGNOSIS — D485 Neoplasm of uncertain behavior of skin: Secondary | ICD-10-CM | POA: Diagnosis not present

## 2021-11-27 DIAGNOSIS — C44329 Squamous cell carcinoma of skin of other parts of face: Secondary | ICD-10-CM | POA: Diagnosis not present

## 2021-11-27 DIAGNOSIS — L905 Scar conditions and fibrosis of skin: Secondary | ICD-10-CM | POA: Diagnosis not present

## 2021-12-05 DIAGNOSIS — M6281 Muscle weakness (generalized): Secondary | ICD-10-CM | POA: Diagnosis not present

## 2021-12-05 DIAGNOSIS — M545 Low back pain, unspecified: Secondary | ICD-10-CM | POA: Diagnosis not present

## 2021-12-05 DIAGNOSIS — Z6832 Body mass index (BMI) 32.0-32.9, adult: Secondary | ICD-10-CM | POA: Diagnosis not present

## 2021-12-05 DIAGNOSIS — L989 Disorder of the skin and subcutaneous tissue, unspecified: Secondary | ICD-10-CM | POA: Diagnosis not present

## 2021-12-05 DIAGNOSIS — M75102 Unspecified rotator cuff tear or rupture of left shoulder, not specified as traumatic: Secondary | ICD-10-CM | POA: Diagnosis not present

## 2021-12-05 DIAGNOSIS — M25512 Pain in left shoulder: Secondary | ICD-10-CM | POA: Diagnosis not present

## 2021-12-05 DIAGNOSIS — R609 Edema, unspecified: Secondary | ICD-10-CM | POA: Diagnosis not present

## 2021-12-05 DIAGNOSIS — I10 Essential (primary) hypertension: Secondary | ICD-10-CM | POA: Diagnosis not present

## 2021-12-10 DIAGNOSIS — M9903 Segmental and somatic dysfunction of lumbar region: Secondary | ICD-10-CM | POA: Diagnosis not present

## 2021-12-10 DIAGNOSIS — M5412 Radiculopathy, cervical region: Secondary | ICD-10-CM | POA: Diagnosis not present

## 2021-12-10 DIAGNOSIS — M9901 Segmental and somatic dysfunction of cervical region: Secondary | ICD-10-CM | POA: Diagnosis not present

## 2021-12-10 DIAGNOSIS — M5033 Other cervical disc degeneration, cervicothoracic region: Secondary | ICD-10-CM | POA: Diagnosis not present

## 2021-12-11 DIAGNOSIS — E1142 Type 2 diabetes mellitus with diabetic polyneuropathy: Secondary | ICD-10-CM | POA: Diagnosis not present

## 2021-12-12 DIAGNOSIS — M25652 Stiffness of left hip, not elsewhere classified: Secondary | ICD-10-CM | POA: Diagnosis not present

## 2021-12-12 DIAGNOSIS — M25552 Pain in left hip: Secondary | ICD-10-CM | POA: Diagnosis not present

## 2021-12-12 DIAGNOSIS — M6281 Muscle weakness (generalized): Secondary | ICD-10-CM | POA: Diagnosis not present

## 2021-12-17 DIAGNOSIS — M25652 Stiffness of left hip, not elsewhere classified: Secondary | ICD-10-CM | POA: Diagnosis not present

## 2021-12-17 DIAGNOSIS — M25552 Pain in left hip: Secondary | ICD-10-CM | POA: Diagnosis not present

## 2021-12-17 DIAGNOSIS — M6281 Muscle weakness (generalized): Secondary | ICD-10-CM | POA: Diagnosis not present

## 2021-12-19 DIAGNOSIS — M6281 Muscle weakness (generalized): Secondary | ICD-10-CM | POA: Diagnosis not present

## 2021-12-19 DIAGNOSIS — M5451 Vertebrogenic low back pain: Secondary | ICD-10-CM | POA: Diagnosis not present

## 2021-12-19 DIAGNOSIS — M25552 Pain in left hip: Secondary | ICD-10-CM | POA: Diagnosis not present

## 2021-12-19 DIAGNOSIS — M25652 Stiffness of left hip, not elsewhere classified: Secondary | ICD-10-CM | POA: Diagnosis not present

## 2021-12-19 DIAGNOSIS — M5442 Lumbago with sciatica, left side: Secondary | ICD-10-CM | POA: Diagnosis not present

## 2021-12-24 DIAGNOSIS — M25652 Stiffness of left hip, not elsewhere classified: Secondary | ICD-10-CM | POA: Diagnosis not present

## 2021-12-24 DIAGNOSIS — M9903 Segmental and somatic dysfunction of lumbar region: Secondary | ICD-10-CM | POA: Diagnosis not present

## 2021-12-24 DIAGNOSIS — M5033 Other cervical disc degeneration, cervicothoracic region: Secondary | ICD-10-CM | POA: Diagnosis not present

## 2021-12-24 DIAGNOSIS — M25552 Pain in left hip: Secondary | ICD-10-CM | POA: Diagnosis not present

## 2021-12-24 DIAGNOSIS — M9901 Segmental and somatic dysfunction of cervical region: Secondary | ICD-10-CM | POA: Diagnosis not present

## 2021-12-24 DIAGNOSIS — M6281 Muscle weakness (generalized): Secondary | ICD-10-CM | POA: Diagnosis not present

## 2021-12-24 DIAGNOSIS — M5412 Radiculopathy, cervical region: Secondary | ICD-10-CM | POA: Diagnosis not present

## 2021-12-26 DIAGNOSIS — M25652 Stiffness of left hip, not elsewhere classified: Secondary | ICD-10-CM | POA: Diagnosis not present

## 2021-12-26 DIAGNOSIS — M545 Low back pain, unspecified: Secondary | ICD-10-CM | POA: Diagnosis not present

## 2021-12-26 DIAGNOSIS — M25552 Pain in left hip: Secondary | ICD-10-CM | POA: Diagnosis not present

## 2021-12-26 DIAGNOSIS — M6281 Muscle weakness (generalized): Secondary | ICD-10-CM | POA: Diagnosis not present

## 2021-12-31 DIAGNOSIS — M6281 Muscle weakness (generalized): Secondary | ICD-10-CM | POA: Diagnosis not present

## 2021-12-31 DIAGNOSIS — M25652 Stiffness of left hip, not elsewhere classified: Secondary | ICD-10-CM | POA: Diagnosis not present

## 2021-12-31 DIAGNOSIS — M25552 Pain in left hip: Secondary | ICD-10-CM | POA: Diagnosis not present

## 2022-01-03 DIAGNOSIS — M5451 Vertebrogenic low back pain: Secondary | ICD-10-CM | POA: Diagnosis not present

## 2022-01-03 DIAGNOSIS — M5442 Lumbago with sciatica, left side: Secondary | ICD-10-CM | POA: Diagnosis not present

## 2022-01-07 DIAGNOSIS — M25652 Stiffness of left hip, not elsewhere classified: Secondary | ICD-10-CM | POA: Diagnosis not present

## 2022-01-07 DIAGNOSIS — M6281 Muscle weakness (generalized): Secondary | ICD-10-CM | POA: Diagnosis not present

## 2022-01-07 DIAGNOSIS — M25552 Pain in left hip: Secondary | ICD-10-CM | POA: Diagnosis not present

## 2022-01-09 DIAGNOSIS — M6281 Muscle weakness (generalized): Secondary | ICD-10-CM | POA: Diagnosis not present

## 2022-01-09 DIAGNOSIS — M25652 Stiffness of left hip, not elsewhere classified: Secondary | ICD-10-CM | POA: Diagnosis not present

## 2022-01-09 DIAGNOSIS — M25552 Pain in left hip: Secondary | ICD-10-CM | POA: Diagnosis not present

## 2022-01-10 DIAGNOSIS — E1142 Type 2 diabetes mellitus with diabetic polyneuropathy: Secondary | ICD-10-CM | POA: Diagnosis not present

## 2022-01-13 DIAGNOSIS — M25552 Pain in left hip: Secondary | ICD-10-CM | POA: Diagnosis not present

## 2022-01-13 DIAGNOSIS — M6281 Muscle weakness (generalized): Secondary | ICD-10-CM | POA: Diagnosis not present

## 2022-01-13 DIAGNOSIS — M25652 Stiffness of left hip, not elsewhere classified: Secondary | ICD-10-CM | POA: Diagnosis not present

## 2022-01-14 DIAGNOSIS — N179 Acute kidney failure, unspecified: Secondary | ICD-10-CM | POA: Diagnosis not present

## 2022-01-14 DIAGNOSIS — E785 Hyperlipidemia, unspecified: Secondary | ICD-10-CM | POA: Diagnosis not present

## 2022-01-14 DIAGNOSIS — E871 Hypo-osmolality and hyponatremia: Secondary | ICD-10-CM | POA: Diagnosis not present

## 2022-01-14 DIAGNOSIS — E1122 Type 2 diabetes mellitus with diabetic chronic kidney disease: Secondary | ICD-10-CM | POA: Diagnosis not present

## 2022-01-14 DIAGNOSIS — I129 Hypertensive chronic kidney disease with stage 1 through stage 4 chronic kidney disease, or unspecified chronic kidney disease: Secondary | ICD-10-CM | POA: Diagnosis not present

## 2022-01-14 DIAGNOSIS — I2510 Atherosclerotic heart disease of native coronary artery without angina pectoris: Secondary | ICD-10-CM | POA: Diagnosis not present

## 2022-01-14 DIAGNOSIS — I6529 Occlusion and stenosis of unspecified carotid artery: Secondary | ICD-10-CM | POA: Diagnosis not present

## 2022-01-14 DIAGNOSIS — I639 Cerebral infarction, unspecified: Secondary | ICD-10-CM | POA: Diagnosis not present

## 2022-01-14 DIAGNOSIS — N183 Chronic kidney disease, stage 3 unspecified: Secondary | ICD-10-CM | POA: Diagnosis not present

## 2022-01-14 DIAGNOSIS — R809 Proteinuria, unspecified: Secondary | ICD-10-CM | POA: Diagnosis not present

## 2022-01-14 DIAGNOSIS — R39198 Other difficulties with micturition: Secondary | ICD-10-CM | POA: Diagnosis not present

## 2022-01-15 DIAGNOSIS — I2510 Atherosclerotic heart disease of native coronary artery without angina pectoris: Secondary | ICD-10-CM | POA: Diagnosis not present

## 2022-01-15 DIAGNOSIS — E1142 Type 2 diabetes mellitus with diabetic polyneuropathy: Secondary | ICD-10-CM | POA: Diagnosis not present

## 2022-01-15 DIAGNOSIS — K219 Gastro-esophageal reflux disease without esophagitis: Secondary | ICD-10-CM | POA: Diagnosis not present

## 2022-01-15 DIAGNOSIS — E7800 Pure hypercholesterolemia, unspecified: Secondary | ICD-10-CM | POA: Diagnosis not present

## 2022-01-15 DIAGNOSIS — I4891 Unspecified atrial fibrillation: Secondary | ICD-10-CM | POA: Diagnosis not present

## 2022-01-15 DIAGNOSIS — N184 Chronic kidney disease, stage 4 (severe): Secondary | ICD-10-CM | POA: Diagnosis not present

## 2022-01-15 DIAGNOSIS — I10 Essential (primary) hypertension: Secondary | ICD-10-CM | POA: Diagnosis not present

## 2022-01-15 DIAGNOSIS — M8580 Other specified disorders of bone density and structure, unspecified site: Secondary | ICD-10-CM | POA: Diagnosis not present

## 2022-01-17 ENCOUNTER — Other Ambulatory Visit: Payer: Self-pay | Admitting: Interventional Cardiology

## 2022-01-17 DIAGNOSIS — M25552 Pain in left hip: Secondary | ICD-10-CM | POA: Diagnosis not present

## 2022-01-17 DIAGNOSIS — M25652 Stiffness of left hip, not elsewhere classified: Secondary | ICD-10-CM | POA: Diagnosis not present

## 2022-01-17 DIAGNOSIS — M6281 Muscle weakness (generalized): Secondary | ICD-10-CM | POA: Diagnosis not present

## 2022-01-20 DIAGNOSIS — M47816 Spondylosis without myelopathy or radiculopathy, lumbar region: Secondary | ICD-10-CM | POA: Diagnosis not present

## 2022-01-20 DIAGNOSIS — M25652 Stiffness of left hip, not elsewhere classified: Secondary | ICD-10-CM | POA: Diagnosis not present

## 2022-01-20 DIAGNOSIS — M25552 Pain in left hip: Secondary | ICD-10-CM | POA: Diagnosis not present

## 2022-01-20 DIAGNOSIS — M6281 Muscle weakness (generalized): Secondary | ICD-10-CM | POA: Diagnosis not present

## 2022-01-22 DIAGNOSIS — M75102 Unspecified rotator cuff tear or rupture of left shoulder, not specified as traumatic: Secondary | ICD-10-CM | POA: Diagnosis not present

## 2022-01-22 DIAGNOSIS — M25512 Pain in left shoulder: Secondary | ICD-10-CM | POA: Diagnosis not present

## 2022-01-22 DIAGNOSIS — M6281 Muscle weakness (generalized): Secondary | ICD-10-CM | POA: Diagnosis not present

## 2022-01-27 DIAGNOSIS — M25552 Pain in left hip: Secondary | ICD-10-CM | POA: Diagnosis not present

## 2022-01-27 DIAGNOSIS — M25652 Stiffness of left hip, not elsewhere classified: Secondary | ICD-10-CM | POA: Diagnosis not present

## 2022-01-27 DIAGNOSIS — M6281 Muscle weakness (generalized): Secondary | ICD-10-CM | POA: Diagnosis not present

## 2022-01-28 DIAGNOSIS — G4733 Obstructive sleep apnea (adult) (pediatric): Secondary | ICD-10-CM | POA: Diagnosis not present

## 2022-01-29 DIAGNOSIS — M6281 Muscle weakness (generalized): Secondary | ICD-10-CM | POA: Diagnosis not present

## 2022-01-29 DIAGNOSIS — M25652 Stiffness of left hip, not elsewhere classified: Secondary | ICD-10-CM | POA: Diagnosis not present

## 2022-01-29 DIAGNOSIS — M25552 Pain in left hip: Secondary | ICD-10-CM | POA: Diagnosis not present

## 2022-01-30 DIAGNOSIS — N183 Chronic kidney disease, stage 3 unspecified: Secondary | ICD-10-CM | POA: Diagnosis not present

## 2022-02-03 DIAGNOSIS — M25552 Pain in left hip: Secondary | ICD-10-CM | POA: Diagnosis not present

## 2022-02-03 DIAGNOSIS — M25652 Stiffness of left hip, not elsewhere classified: Secondary | ICD-10-CM | POA: Diagnosis not present

## 2022-02-03 DIAGNOSIS — M6281 Muscle weakness (generalized): Secondary | ICD-10-CM | POA: Diagnosis not present

## 2022-02-04 ENCOUNTER — Encounter: Payer: Self-pay | Admitting: Intensive Care

## 2022-02-04 ENCOUNTER — Emergency Department: Payer: PPO

## 2022-02-04 ENCOUNTER — Emergency Department
Admission: EM | Admit: 2022-02-04 | Discharge: 2022-02-04 | Disposition: A | Discharge status: Home / Self Care | Payer: PPO | Attending: Emergency Medicine | Admitting: Emergency Medicine

## 2022-02-04 ENCOUNTER — Other Ambulatory Visit: Payer: Self-pay

## 2022-02-04 DIAGNOSIS — S0990XA Unspecified injury of head, initial encounter: Secondary | ICD-10-CM | POA: Diagnosis not present

## 2022-02-04 DIAGNOSIS — Z7901 Long term (current) use of anticoagulants: Secondary | ICD-10-CM | POA: Insufficient documentation

## 2022-02-04 DIAGNOSIS — M47812 Spondylosis without myelopathy or radiculopathy, cervical region: Secondary | ICD-10-CM | POA: Diagnosis not present

## 2022-02-04 DIAGNOSIS — M4802 Spinal stenosis, cervical region: Secondary | ICD-10-CM | POA: Diagnosis not present

## 2022-02-04 DIAGNOSIS — S60511A Abrasion of right hand, initial encounter: Secondary | ICD-10-CM | POA: Insufficient documentation

## 2022-02-04 DIAGNOSIS — S80212A Abrasion, left knee, initial encounter: Secondary | ICD-10-CM | POA: Insufficient documentation

## 2022-02-04 DIAGNOSIS — W1830XA Fall on same level, unspecified, initial encounter: Secondary | ICD-10-CM | POA: Insufficient documentation

## 2022-02-04 DIAGNOSIS — W19XXXA Unspecified fall, initial encounter: Secondary | ICD-10-CM

## 2022-02-04 DIAGNOSIS — M9901 Segmental and somatic dysfunction of cervical region: Secondary | ICD-10-CM | POA: Diagnosis not present

## 2022-02-04 DIAGNOSIS — S80211A Abrasion, right knee, initial encounter: Secondary | ICD-10-CM | POA: Diagnosis not present

## 2022-02-04 DIAGNOSIS — M5033 Other cervical disc degeneration, cervicothoracic region: Secondary | ICD-10-CM | POA: Diagnosis not present

## 2022-02-04 DIAGNOSIS — S0083XA Contusion of other part of head, initial encounter: Secondary | ICD-10-CM | POA: Diagnosis not present

## 2022-02-04 DIAGNOSIS — R52 Pain, unspecified: Secondary | ICD-10-CM | POA: Diagnosis not present

## 2022-02-04 DIAGNOSIS — Z8673 Personal history of transient ischemic attack (TIA), and cerebral infarction without residual deficits: Secondary | ICD-10-CM | POA: Diagnosis not present

## 2022-02-04 DIAGNOSIS — Z23 Encounter for immunization: Secondary | ICD-10-CM | POA: Insufficient documentation

## 2022-02-04 DIAGNOSIS — I10 Essential (primary) hypertension: Secondary | ICD-10-CM | POA: Diagnosis not present

## 2022-02-04 DIAGNOSIS — M9903 Segmental and somatic dysfunction of lumbar region: Secondary | ICD-10-CM | POA: Diagnosis not present

## 2022-02-04 DIAGNOSIS — S0080XA Unspecified superficial injury of other part of head, initial encounter: Secondary | ICD-10-CM | POA: Diagnosis present

## 2022-02-04 DIAGNOSIS — S60512A Abrasion of left hand, initial encounter: Secondary | ICD-10-CM | POA: Insufficient documentation

## 2022-02-04 DIAGNOSIS — S199XXA Unspecified injury of neck, initial encounter: Secondary | ICD-10-CM | POA: Diagnosis not present

## 2022-02-04 DIAGNOSIS — M5412 Radiculopathy, cervical region: Secondary | ICD-10-CM | POA: Diagnosis not present

## 2022-02-04 DIAGNOSIS — I6522 Occlusion and stenosis of left carotid artery: Secondary | ICD-10-CM | POA: Diagnosis not present

## 2022-02-04 MED ORDER — TETANUS-DIPHTH-ACELL PERTUSSIS 5-2.5-18.5 LF-MCG/0.5 IM SUSY
0.5000 mL | PREFILLED_SYRINGE | Freq: Once | INTRAMUSCULAR | Status: AC
Start: 2022-02-04 — End: 2022-02-04
  Administered 2022-02-04: 0.5 mL via INTRAMUSCULAR
  Filled 2022-02-04: qty 0.5

## 2022-02-04 MED ORDER — BACITRACIN ZINC 500 UNIT/GM EX OINT
TOPICAL_OINTMENT | Freq: Once | CUTANEOUS | Status: AC
  Filled 2022-02-04: qty 1.8

## 2022-02-04 MED ORDER — SM DOUBLE ANTIBIOTIC 500-10000 UNIT/GM EX OINT
TOPICAL_OINTMENT | Freq: Two times a day (BID) | CUTANEOUS | Status: DC
  Filled 2022-02-04: qty 28.3

## 2022-02-10 DIAGNOSIS — E1142 Type 2 diabetes mellitus with diabetic polyneuropathy: Secondary | ICD-10-CM | POA: Diagnosis not present

## 2022-02-12 DIAGNOSIS — S60511D Abrasion of right hand, subsequent encounter: Secondary | ICD-10-CM | POA: Diagnosis not present

## 2022-02-12 DIAGNOSIS — R400 Somnolence: Secondary | ICD-10-CM | POA: Diagnosis not present

## 2022-02-12 DIAGNOSIS — R2689 Other abnormalities of gait and mobility: Secondary | ICD-10-CM | POA: Diagnosis not present

## 2022-02-12 DIAGNOSIS — S60512D Abrasion of left hand, subsequent encounter: Secondary | ICD-10-CM | POA: Diagnosis not present

## 2022-02-12 DIAGNOSIS — Z6831 Body mass index (BMI) 31.0-31.9, adult: Secondary | ICD-10-CM | POA: Diagnosis not present

## 2022-02-12 DIAGNOSIS — M792 Neuralgia and neuritis, unspecified: Secondary | ICD-10-CM | POA: Diagnosis not present

## 2022-02-18 DIAGNOSIS — M47816 Spondylosis without myelopathy or radiculopathy, lumbar region: Secondary | ICD-10-CM | POA: Diagnosis not present

## 2022-02-18 DIAGNOSIS — M5033 Other cervical disc degeneration, cervicothoracic region: Secondary | ICD-10-CM | POA: Diagnosis not present

## 2022-02-18 DIAGNOSIS — M5412 Radiculopathy, cervical region: Secondary | ICD-10-CM | POA: Diagnosis not present

## 2022-02-18 DIAGNOSIS — M9901 Segmental and somatic dysfunction of cervical region: Secondary | ICD-10-CM | POA: Diagnosis not present

## 2022-02-18 DIAGNOSIS — M9903 Segmental and somatic dysfunction of lumbar region: Secondary | ICD-10-CM | POA: Diagnosis not present

## 2022-03-04 DIAGNOSIS — M5412 Radiculopathy, cervical region: Secondary | ICD-10-CM | POA: Diagnosis not present

## 2022-03-04 DIAGNOSIS — M9903 Segmental and somatic dysfunction of lumbar region: Secondary | ICD-10-CM | POA: Diagnosis not present

## 2022-03-04 DIAGNOSIS — M5033 Other cervical disc degeneration, cervicothoracic region: Secondary | ICD-10-CM | POA: Diagnosis not present

## 2022-03-04 DIAGNOSIS — M9901 Segmental and somatic dysfunction of cervical region: Secondary | ICD-10-CM | POA: Diagnosis not present

## 2022-03-10 DIAGNOSIS — M47816 Spondylosis without myelopathy or radiculopathy, lumbar region: Secondary | ICD-10-CM | POA: Diagnosis not present

## 2022-03-12 DIAGNOSIS — E1142 Type 2 diabetes mellitus with diabetic polyneuropathy: Secondary | ICD-10-CM | POA: Diagnosis not present

## 2022-03-18 DIAGNOSIS — M5412 Radiculopathy, cervical region: Secondary | ICD-10-CM | POA: Diagnosis not present

## 2022-03-18 DIAGNOSIS — M9903 Segmental and somatic dysfunction of lumbar region: Secondary | ICD-10-CM | POA: Diagnosis not present

## 2022-03-18 DIAGNOSIS — M9901 Segmental and somatic dysfunction of cervical region: Secondary | ICD-10-CM | POA: Diagnosis not present

## 2022-03-18 DIAGNOSIS — M5033 Other cervical disc degeneration, cervicothoracic region: Secondary | ICD-10-CM | POA: Diagnosis not present

## 2022-04-01 DIAGNOSIS — M9901 Segmental and somatic dysfunction of cervical region: Secondary | ICD-10-CM | POA: Diagnosis not present

## 2022-04-01 DIAGNOSIS — M5033 Other cervical disc degeneration, cervicothoracic region: Secondary | ICD-10-CM | POA: Diagnosis not present

## 2022-04-01 DIAGNOSIS — M5412 Radiculopathy, cervical region: Secondary | ICD-10-CM | POA: Diagnosis not present

## 2022-04-01 DIAGNOSIS — M9903 Segmental and somatic dysfunction of lumbar region: Secondary | ICD-10-CM | POA: Diagnosis not present

## 2022-04-09 ENCOUNTER — Telehealth: Payer: Self-pay | Admitting: Diagnostic Neuroimaging

## 2022-04-09 ENCOUNTER — Ambulatory Visit: Payer: PPO | Admitting: Diagnostic Neuroimaging

## 2022-04-09 ENCOUNTER — Encounter: Payer: Self-pay | Admitting: *Deleted

## 2022-04-09 VITALS — BP 135/73 | HR 80 | Ht 72.0 in | Wt 227.2 lb

## 2022-04-09 DIAGNOSIS — R269 Unspecified abnormalities of gait and mobility: Secondary | ICD-10-CM

## 2022-04-09 NOTE — Patient Instructions (Signed)
  GAIT DIFFICULTY (likely related to diabetic neuropathy, lumbar spine dz, hip arthritis, knee arthritis, peripheral arterial disease) - check MRI cervical spine (rule out myelopathy) - check MRI lumbar spine (rule out spinal stenosis; neurogenic claudication)  INTERMITTENT RIGHT SUPRAORBITAL NEURALGIA - consider nerve block per pain mgmt; intermittent, now only few times per month

## 2022-04-09 NOTE — Telephone Encounter (Signed)
[employer]alt[employer] advantage NPR sent to GI

## 2022-04-09 NOTE — Progress Notes (Signed)
[facility]  PATIENT: Bradley [name] DOB: 1946/07/27  REFERRING CLINICIAN: [name], MD HISTORY FROM: patient  REASON FOR VISIT: new consult    HISTORICAL  CHIEF COMPLAINT:  Chief Complaint  Patient presents with   New Patient (Initial Visit)    Pt reports doing good overall. He stats having trouble walking and hard time getting up.He states having a sharp pain in his right eye and some in left eye.He reports he has not had it happen in a while. He reports having a headache with the sharp pain as well. Room 6 with wife.    HISTORY OF PRESENT ILLNESS:   76 year old male here for evaluation of gait and balance difficulty.  Has had progressive gait and balance difficulty for 6 to 12 months.  When he stands up feels increasing pain in his back and legs, legs gave out and shake.  Symptoms worsening over time.  Having more falls.  History of diabetes for past 40 years.  Has had diabetic neuropathy for at least 8 to 10 years.  History of neck pain, low back pain, peripheral artery disease, arthritis in hips, arthritis in knees.  History of stroke in the 1990s with aphasia and left-sided weakness.  Fortunately he had a good recovery afterwards.  Has had some pain in the right supraorbital lesion, with sharp shooting pains lasting few seconds at a time.  Symptoms have subsided recently.   REVIEW OF SYSTEMS: Full 14 system review of systems performed and negative with exception of: as per HPI.  ALLERGIES: Allergies  Allergen Reactions   Doxazosin Mesylate     Other reaction(s): Diarrhea, dizziness   Hydrocodone Nausea Only   Niacin     Other reaction(s): Skin irritation   Oxycodone Nausea Only   Penicillins Rash    Has patient had a PCN reaction causing immediate rash, facial/tongue/throat swelling, SOB or lightheadedness with hypotension: NO Has patient had a PCN reaction causing severe rash involving mucus membranes or skin necrosis:NO Has  patient had a PCN reaction that required hospitalization NO Has patient had a PCN reaction occurring within the last 10 years: NO If all of the above answers are "NO", then may proceed with Cephalosporin use.    Sulfa Drugs Cross Reactors Rash    HOME MEDICATIONS: Outpatient Medications Prior to Visit  Medication Sig Dispense Refill   amLODipine (NORVASC) 10 MG tablet Take 1 tablet (10 mg total) by mouth daily. 90 tablet 3   apixaban (ELIQUIS) 5 MG TABS tablet Take 1 tablet (5 mg total) by mouth 2 (two) times daily. 180 tablet 1   aspirin EC 81 MG tablet Take 81 mg by mouth daily. Swallow whole.     atorvastatin (LIPITOR) 40 MG tablet Take 40 mg by mouth daily.     Cholecalciferol (VITAMIN D3) 125 MCG (5000 UT) TABS Take 5,000 Units by mouth daily.      dexlansoprazole (DEXILANT) 60 MG capsule Take 60 mg by mouth daily.      famotidine (PEPCID) 20 MG tablet Take 20 mg by mouth at bedtime as needed.     FIASP FLEXTOUCH 100 UNIT/ML SOPN Inject 20-25 Units into the skin in the morning, at noon, and at bedtime. Sliding scale as per the carbohydrate intake.     gabapentin (NEURONTIN) 300 MG capsule Take 600 mg by mouth at bedtime.      hydrALAZINE (APRESOLINE) 25 MG tablet Take 1 tablet (25 mg total) by mouth in the morning and at bedtime. [place]  tablet 2   hydrocortisone (ANUSOL-HC) 2.5 % rectal cream Place 1 application rectally daily as needed for hemorrhoids.      hydrocortisone 2.5 % cream Apply 1 application topically 2 (two) times daily as needed (irritation).     ketoconazole (NIZORAL) 2 % shampoo Apply 1 application topically daily as needed for irritation.      L-Methylfolate-Algae-B12-B6 (FOLTANX RF) 3-90.314-2-35 MG CAPS Take 1 capsule by mouth in the morning and at bedtime.      Melatonin 2.5 MG CHEW Chew 2.5 mg by mouth at bedtime.     methocarbamol (ROBAXIN) 500 MG tablet Take 500 mg by mouth every 6 (six) hours as needed for muscle spasms.      metoprolol tartrate (LOPRESSOR) 50 MG  tablet TAKE 1 AND 1/2 TABLETS BY MOUTH TWICE DAILY 270 tablet 0   nitroGLYCERIN (NITROSTAT) 0.3 MG SL tablet Place 1 tablet (0.3 mg total) under the tongue every 5 (five) minutes as needed for chest pain. Only as needed 90 tablet 0   testosterone cypionate (DEPOTESTOSTERONE CYPIONATE) 200 MG/ML injection Inject 200 mg into the muscle once a week.     torsemide (DEMADEX) 20 MG tablet Take 1 tablet (20 mg total) by mouth daily. 90 tablet 3   TRESIBA FLEXTOUCH 100 UNIT/ML SOPN FlexTouch Pen Inject 25 Units into the skin daily. AM     TRULICITY 1.61 WR/6.0AV SOPN Inject 0.75 mg into the skin once a week. Sunday morning     traMADol (ULTRAM) 50 MG tablet Take 1 tablet (50 mg total) by mouth every 6 (six) hours as needed. (Patient not taking: Reported on 04/09/2022) 30 tablet 0   No facility-administered medications prior to visit.    PAST MEDICAL HISTORY: Past Medical History:  Diagnosis Date   Acquired equinus deformity of foot 12/15/2011   Anemia    Arthritis    Carotid artery occlusion    a. Carotid US 10/16: RICA 60-79%, L CEA patent with 1-39% stenosis   CKD (chronic kidney disease)    stage 3-4   Coagulation disorder ([place]) 05/25/2019   Coronary artery disease    a. Myoview 9/16:  EF 52%, inferior fixed defect consistent with diaphragmatic attenuation, intermediate risk secondary to poor exercise tolerance and symptoms during stress;  b. LHC 05/17/2015 90% mid LAD, 80% ost D2, 75% mid RCA, 35% prox RCA. >> S/p CABG   Coronary artery disease involving coronary bypass graft of native heart with angina pectoris ([place])    CORONARY ARTERY BYPASS GRAFTING x 3 - 05/23/2015 Left internal mammary artery to left anterior descending  Saphenous vein graft to diagonal  Saphenous vein graft to posterior descending Endoscopic greater saphenous vein harvest right thigh     Diabetes mellitus age 89   Diverticulitis    Diverticulosis of colon without hemorrhage 05/17/2015   By CT scan    DM type 2,  controlled, with complication ([place]) 40/98/1191   Essential hypertension 04/29/2015   GERD (gastroesophageal reflux disease)    H/O hiatal hernia    History of echocardiogram    a. Echo 9/16: GLS -15.2%, EF 47-82%, grade 1 diastolic dysfunction, normal wall motion, aortic sclerosis, dilated aortic root 39 mm, atrial septal lipomatous hypertrophy   Hyperlipidemia    Hypertension    Irritable bowel syndrome (IBS)    Myocardial infarction ([place])    silent inferior MI; patient denies MI history (03/17/13)    Neuralgia    Neuropathy 2013   OSA on CPAP 05/30/2015   Osteoporosis 2013  PAF (paroxysmal atrial fibrillation) ([place])    post CABG 2016; Amiodarone stopped 2/2 wheezing; PAF 01/12/20   Peripheral vascular disease ([place])    Plantar fasciitis 12/15/2011   Reflux esophagitis    Right bundle branch block 04/30/2015   Sleep apnea    uses CPAP   Stenosis of right internal carotid artery with cerebral infarction ([place]) 12/09/2011   Stress fracture of metatarsal bone 02/18/2012   Stroke ([place]) 1987   Right brain stroke- slight drop on left side   Syncope 01/12/2020   Type 2 diabetes mellitus with vascular disease ([place]) 05/30/2015   Wears glasses     PAST SURGICAL HISTORY: Past Surgical History:  Procedure Laterality Date   ABDOMINAL AORTOGRAM W/LOWER EXTREMITY Bilateral 11/27/2020   Procedure: ABDOMINAL AORTOGRAM W/LOWER EXTREMITY;  Surgeon: Serafina Mitchell, MD;  Location: [facility];  Service: Cardiovascular;  Laterality: Bilateral;   CARDIAC CATHETERIZATION N/A 05/17/2015   Procedure: Left Heart Cath and Coronary Angiography;  Surgeon: Belva Crome, MD;  Location: [facility];  Service: Cardiovascular;  Laterality: N/A;   CAROTID ENDARTERECTOMY  1994 & redo 2001   Left   COLONOSCOPY W/ BIOPSIES AND POLYPECTOMY     CORONARY ARTERY BYPASS GRAFT N/A 05/23/2015   Procedure: CORONARY ARTERY BYPASS GRAFTING (CABG) x 3 (LIMA to LAD, SVG to DIAGONAL 2, SVG to PDA) with Endoscopic  Vein Harvesting from right greater saphenous vein;  Surgeon: Grace Isaac, MD;  Location: [facility];  Service: Open Heart Surgery;  Laterality: N/A;   ENDARTERECTOMY FEMORAL Right 11/30/2020   Procedure: ENDARTERECTOMY FEMORAL RIGHT;  Surgeon: Rosetta Posner, MD;  Location: [facility];  Service: Vascular;  Laterality: Right;   FEMORAL-POPLITEAL BYPASS GRAFT Right 11/30/2020   Procedure: BYPASS GRAFT FEMORAL-POPLITEAL ARTERY RIGHT;  Surgeon: Rosetta Posner, MD;  Location: [facility];  Service: Vascular;  Laterality: Right;   FRACTURE SURGERY  2013   Right   foo  t X's 2   ILIAC ARTERY STENT     LAPAROSCOPIC CHOLECYSTECTOMY  09/11/2016   POSTERIOR LUMBAR FUSION  Aug. 14, 2014   Level 1   [place] SURGERY  2014   TEE WITHOUT CARDIOVERSION N/A 05/23/2015   Procedure: TRANSESOPHAGEAL ECHOCARDIOGRAM (TEE);  Surgeon: Grace Isaac, MD;  Location: [place];  Service: Open Heart Surgery;  Laterality: N/A;   TONSILLECTOMY     TRANSCAROTID ARTERY REVASCULARIZATION  Right 06/01/2020   Procedure: RIGHT TRANSCAROTID ARTERY REVASCULARIZATION;  Surgeon: Serafina Mitchell, MD;  Location: [facility];  Service: Vascular;  Laterality: Right;   TRIGGER FINGER RELEASE Right 07/14/2017   Procedure: RIGHT LONG FINGER TRIGGER RELEASE;  Surgeon: Milly Jakob, MD;  Location: [facility];  Service: Orthopedics;  Laterality: Right;    FAMILY HISTORY: Family History  Problem Relation Age of Onset   Cancer Mother 42       pancreatic   Hyperlipidemia Mother    Stroke Father 15   Deep vein thrombosis Father    Hyperlipidemia Father    Hypertension Father     SOCIAL HISTORY: Social History   Socioeconomic History   Marital status: Married    Spouse name: Not on file   Number of children: 2   Years of education: Not on file   Highest education level: Not on file  Occupational History   Not on file  Tobacco Use   Smoking status: Former    Packs/day: 2.00    Years:  50.00    Total pack years: 100.00    Types: Cigarettes    Quit date: 03/26/2009    Years since quitting: 13.0   Smokeless tobacco: Never  Vaping Use   Vaping Use: Never used  Substance and Sexual Activity   Alcohol use: No   Drug use: No   Sexual activity: Not on file  Other Topics Concern   Not on file  Social History Narrative   Lives with wife   Caffeine coffee 3 c daily, diet coke   Social Determinants of Health   Financial Resource Strain: Not on file  Food Insecurity: Not on file  Transportation Needs: Not on file  Physical Activity: Not on file  Stress: Not on file  Social Connections: Not on file  Intimate Partner Violence: Not on file     PHYSICAL EXAM  GENERAL EXAM/CONSTITUTIONAL: Vitals:  Vitals:   04/09/22 0916  BP: 135/73  Pulse: 80  Weight: 227 lb 4 oz (103.1 kg)  Height: 6' (1.829 m)   Body mass index is 30.82 kg/m. Wt Readings from Last 3 Encounters:  04/09/22 227 lb 4 oz (103.1 kg)  02/04/22 220 lb (99.8 kg)  10/15/21 230 lb (104.3 kg)   Patient is in no distress; well developed, nourished and groomed; neck is supple  CARDIOVASCULAR: Examination of carotid arteries is normal; no carotid bruits Regular rate and rhythm, no murmurs Examination of peripheral vascular system by observation and palpation is normal  EYES: Ophthalmoscopic exam of optic discs and posterior segments is normal; no papilledema or hemorrhages No results found.  MUSCULOSKELETAL: Gait, strength, tone, movements noted in Neurologic exam below  NEUROLOGIC: MENTAL STATUS:      No data to display         awake, alert, oriented to person, place and time recent and remote memory intact normal attention and concentration language fluent, comprehension intact, naming intact fund of knowledge appropriate  CRANIAL NERVE:  2nd - no papilledema on fundoscopic exam 2nd, 3rd, 4th, 6th - pupils equal and reactive to light, visual fields full to confrontation,  extraocular muscles intact, no nystagmus 5th - facial sensation symmetric 7th - facial strength symmetric 8th - hearing intact 9th - palate elevates symmetrically, uvula midline 11th - shoulder shrug symmetric 12th - tongue protrusion midline  MOTOR:  normal bulk and tone, full strength in the BUE, BLE; EXCEPT WEAKNESS IN R > L HIP FLEXION (4) AND RIGHT DF (4)  SENSORY:  normal and symmetric to light touch, temperature, vibration; ABSENT IN FEET, ANKLES  COORDINATION:  finger-nose-finger, fine finger movements normal  REFLEXES:  deep tendon reflexes BRISK IN BUE; TRACE IN BLE and symmetric  GAIT/STATION:  narrow based gait; SLIGHTLY STIFF GAIT; USING 3 WHEEL WALKER     DIAGNOSTIC DATA (LABS, IMAGING, TESTING) - I reviewed patient records, labs, notes, testing and imaging myself where available.  Lab Results  Component Value Date   WBC 7.2 12/01/2020   HGB 12.1 (L) 12/01/2020   HCT 38.4 (L) 12/01/2020   MCV 97.5 12/01/2020   PLT 156 12/01/2020      Component Value Date/Time   NA 132 (L) 12/02/2020 0101   NA 134 02/01/2020 1219   K 3.8 12/02/2020 0101   CL 101 12/02/2020 0101   CO2 24 12/02/2020 0101   GLUCOSE 161 (H) 12/02/2020 0101   BUN 12 12/02/2020 0101   BUN 21 02/01/2020 1219   CREATININE 0.81 12/02/2020 0101   CREATININE 1.76 (H) 09/19/2015 1111   CALCIUM 8.9  12/02/2020 0101   PROT 6.5 11/30/2020 0647   ALBUMIN 3.6 11/30/2020 0647   AST 24 11/30/2020 0647   ALT 22 11/30/2020 0647   ALKPHOS 51 11/30/2020 0647   BILITOT 0.7 11/30/2020 0647   GFRNONAA >60 12/02/2020 0101   GFRAA 41 (L) 02/01/2020 1219   Lab Results  Component Value Date   CHOL 88 12/01/2020   HDL 24 (L) 12/01/2020   LDLCALC 31 12/01/2020   TRIG 163 (H) 12/01/2020   CHOLHDL 3.7 12/01/2020   Lab Results  Component Value Date   HGBA1C 7.5 (H) 11/30/2020   No results found for: "VITAMINB12" Lab Results  Component Value Date   TSH 2.630 05/26/2015     CT head  - No acute  intracranial findings are seen in the noncontrast CT brain. Atrophy. Old infarct is seen in the right parietal cortex with no significant change.   ASSESSMENT AND PLAN  76 y.o. year old male here with:   Dx:  1. Gait difficulty      PLAN:  GAIT DIFFICULTY (likely related to diabetic neuropathy, lumbar spine dz, hip arthritis, knee arthritis, peripheral arterial disease) - check MRI cervical spine (rule out myelopathy) - check MRI lumbar spine (rule out spinal stenosis; neurogenic claudication) - CONTINUE PT EXERCISES AT HOME  INTERMITTENT RIGHT SUPRAORBITAL NEURALGIA - consider nerve block per pain mgmt; intermittent, now only few times per month  Orders Placed This Encounter  Procedures   MR CERVICAL SPINE WO CONTRAST   MR [place]   Return for pending test results, pending if symptoms worsen or fail to improve.    Penni Bombard, MD 4/97/5300, 5:11 AM Certified in Neurology, Neurophysiology and Neuroimaging  [facility] [address] [place], [place] [address] [phone]

## 2022-04-11 DIAGNOSIS — E1142 Type 2 diabetes mellitus with diabetic polyneuropathy: Secondary | ICD-10-CM | POA: Diagnosis not present

## 2022-04-15 DIAGNOSIS — M9903 Segmental and somatic dysfunction of lumbar region: Secondary | ICD-10-CM | POA: Diagnosis not present

## 2022-04-15 DIAGNOSIS — M5412 Radiculopathy, cervical region: Secondary | ICD-10-CM | POA: Diagnosis not present

## 2022-04-15 DIAGNOSIS — M5033 Other cervical disc degeneration, cervicothoracic region: Secondary | ICD-10-CM | POA: Diagnosis not present

## 2022-04-15 DIAGNOSIS — M9901 Segmental and somatic dysfunction of cervical region: Secondary | ICD-10-CM | POA: Diagnosis not present

## 2022-04-20 ENCOUNTER — Ambulatory Visit
Admission: RE | Admit: 2022-04-20 | Discharge: 2022-04-20 | Disposition: A | Discharge status: Home / Self Care | Payer: PPO | Source: Ambulatory Visit | Attending: Diagnostic Neuroimaging | Admitting: Diagnostic Neuroimaging

## 2022-04-20 DIAGNOSIS — R269 Unspecified abnormalities of gait and mobility: Secondary | ICD-10-CM

## 2022-04-20 DIAGNOSIS — M48061 Spinal stenosis, lumbar region without neurogenic claudication: Secondary | ICD-10-CM | POA: Diagnosis not present

## 2022-04-22 DIAGNOSIS — R5383 Other fatigue: Secondary | ICD-10-CM | POA: Diagnosis not present

## 2022-04-22 DIAGNOSIS — E871 Hypo-osmolality and hyponatremia: Secondary | ICD-10-CM | POA: Diagnosis not present

## 2022-04-22 DIAGNOSIS — N179 Acute kidney failure, unspecified: Secondary | ICD-10-CM | POA: Diagnosis not present

## 2022-04-22 DIAGNOSIS — I2510 Atherosclerotic heart disease of native coronary artery without angina pectoris: Secondary | ICD-10-CM | POA: Diagnosis not present

## 2022-04-22 DIAGNOSIS — I129 Hypertensive chronic kidney disease with stage 1 through stage 4 chronic kidney disease, or unspecified chronic kidney disease: Secondary | ICD-10-CM | POA: Diagnosis not present

## 2022-04-22 DIAGNOSIS — I639 Cerebral infarction, unspecified: Secondary | ICD-10-CM | POA: Diagnosis not present

## 2022-04-22 DIAGNOSIS — E785 Hyperlipidemia, unspecified: Secondary | ICD-10-CM | POA: Diagnosis not present

## 2022-04-22 DIAGNOSIS — R39198 Other difficulties with micturition: Secondary | ICD-10-CM | POA: Diagnosis not present

## 2022-04-22 DIAGNOSIS — R809 Proteinuria, unspecified: Secondary | ICD-10-CM | POA: Diagnosis not present

## 2022-04-22 DIAGNOSIS — E1122 Type 2 diabetes mellitus with diabetic chronic kidney disease: Secondary | ICD-10-CM | POA: Diagnosis not present

## 2022-04-22 DIAGNOSIS — N183 Chronic kidney disease, stage 3 unspecified: Secondary | ICD-10-CM | POA: Diagnosis not present

## 2022-04-24 NOTE — Addendum Note (Signed)
Addended by: Andrey Spearman R on: 04/24/2022 05:33 PM   Modules accepted: Orders

## 2022-04-25 ENCOUNTER — Telehealth: Payer: Self-pay | Admitting: Diagnostic Neuroimaging

## 2022-04-25 NOTE — Telephone Encounter (Signed)
Referral for neurosurgery sent to [facility].Phone: [phone], Fax: [phone]

## 2022-04-29 DIAGNOSIS — M9903 Segmental and somatic dysfunction of lumbar region: Secondary | ICD-10-CM | POA: Diagnosis not present

## 2022-04-29 DIAGNOSIS — M5412 Radiculopathy, cervical region: Secondary | ICD-10-CM | POA: Diagnosis not present

## 2022-04-29 DIAGNOSIS — M5033 Other cervical disc degeneration, cervicothoracic region: Secondary | ICD-10-CM | POA: Diagnosis not present

## 2022-04-29 DIAGNOSIS — M9901 Segmental and somatic dysfunction of cervical region: Secondary | ICD-10-CM | POA: Diagnosis not present

## 2022-04-30 DIAGNOSIS — L820 Inflamed seborrheic keratosis: Secondary | ICD-10-CM | POA: Diagnosis not present

## 2022-04-30 DIAGNOSIS — L821 Other seborrheic keratosis: Secondary | ICD-10-CM | POA: Diagnosis not present

## 2022-04-30 DIAGNOSIS — L218 Other seborrheic dermatitis: Secondary | ICD-10-CM | POA: Diagnosis not present

## 2022-04-30 DIAGNOSIS — L570 Actinic keratosis: Secondary | ICD-10-CM | POA: Diagnosis not present

## 2022-04-30 DIAGNOSIS — D485 Neoplasm of uncertain behavior of skin: Secondary | ICD-10-CM | POA: Diagnosis not present

## 2022-04-30 DIAGNOSIS — X32XXXA Exposure to sunlight, initial encounter: Secondary | ICD-10-CM | POA: Diagnosis not present

## 2022-05-01 DIAGNOSIS — R309 Painful micturition, unspecified: Secondary | ICD-10-CM | POA: Diagnosis not present

## 2022-05-02 DIAGNOSIS — Z794 Long term (current) use of insulin: Secondary | ICD-10-CM | POA: Diagnosis not present

## 2022-05-02 DIAGNOSIS — E041 Nontoxic single thyroid nodule: Secondary | ICD-10-CM | POA: Diagnosis not present

## 2022-05-02 DIAGNOSIS — E1142 Type 2 diabetes mellitus with diabetic polyneuropathy: Secondary | ICD-10-CM | POA: Diagnosis not present

## 2022-05-02 DIAGNOSIS — I2510 Atherosclerotic heart disease of native coronary artery without angina pectoris: Secondary | ICD-10-CM | POA: Diagnosis not present

## 2022-05-02 DIAGNOSIS — I10 Essential (primary) hypertension: Secondary | ICD-10-CM | POA: Diagnosis not present

## 2022-05-05 DIAGNOSIS — M4712 Other spondylosis with myelopathy, cervical region: Secondary | ICD-10-CM | POA: Diagnosis not present

## 2022-05-12 DIAGNOSIS — E1142 Type 2 diabetes mellitus with diabetic polyneuropathy: Secondary | ICD-10-CM | POA: Diagnosis not present

## 2022-05-14 NOTE — Progress Notes (Unsigned)
Cardiology Office Note:    Date:  05/15/2022   ID:  Bradley [name], DOB 08/11/1946, MRN [id number]  PCP:  [name], MD  Cardiologist:  Sinclair Grooms, MD   Referring MD: [name], MD   No chief complaint on file.   History of Present Illness:    Bradley [name] is a 76 y.o. male with a hx of CAD s/p CABG x3V2016, chronic combined systolic and diastolic heart failure, HTN, HLD, OSA, PAD with L CEA x2 and aorto-bifem bypass, OSA on CPAP, New onset PAF leading to Eliquis and discontinuation of plavix/aggrenox (PAD), R TCAR 06/01/2020, and right femoral endarterectomy 11/2020.   He is losing endurance mostly because he is unable to do much physical activity before he develops leg fatigue and pain.  He also notices that his endurance is decreased.  He is unable to exercise because of PAD.  He denies angina, nitroglycerin use, syncope, new neurological symptoms, and has not had any recurrence of atrial fibrillation that we are aware of.  He has not healed up the way he thought he would after lower extremity vascular surgery.  He still has significant lower extremity swelling that progresses throughout the day and causes discomfort.  Past Medical History:  Diagnosis Date   Acquired equinus deformity of foot 12/15/2011   Anemia    Arthritis    Carotid artery occlusion    a. Carotid US 10/16: RICA 60-79%, L CEA patent with 1-39% stenosis   CKD (chronic kidney disease)    stage 3-4   Coagulation disorder ([place]) 05/25/2019   Coronary artery disease    a. Myoview 9/16:  EF 52%, inferior fixed defect consistent with diaphragmatic attenuation, intermediate risk secondary to poor exercise tolerance and symptoms during stress;  b. LHC 05/17/2015 90% mid LAD, 80% ost D2, 75% mid RCA, 35% prox RCA. >> S/p CABG   Coronary artery disease involving coronary bypass graft of native heart with angina pectoris ([place])    CORONARY ARTERY BYPASS GRAFTING x 3 - 05/23/2015 Left internal  mammary artery to left anterior descending  Saphenous vein graft to diagonal  Saphenous vein graft to posterior descending Endoscopic greater saphenous vein harvest right thigh     Diabetes mellitus age 89   Diverticulitis    Diverticulosis of colon without hemorrhage 05/17/2015   By CT scan    DM type 2, controlled, with complication ([place]) 22/09/5425   Essential hypertension 04/29/2015   GERD (gastroesophageal reflux disease)    H/O hiatal hernia    History of echocardiogram    a. Echo 9/16: GLS -15.2%, EF 06-23%, grade 1 diastolic dysfunction, normal wall motion, aortic sclerosis, dilated aortic root 39 mm, atrial septal lipomatous hypertrophy   Hyperlipidemia    Hypertension    Irritable bowel syndrome (IBS)    Myocardial infarction ([place])    silent inferior MI; patient denies MI history (03/17/13)    Neuralgia    Neuropathy 2013   OSA on CPAP 05/30/2015   Osteoporosis 2013   PAF (paroxysmal atrial fibrillation) ([place])    post CABG 2016; Amiodarone stopped 2/2 wheezing; PAF 01/12/20   Peripheral vascular disease ([place])    Plantar fasciitis 12/15/2011   Reflux esophagitis    Right bundle branch block 04/30/2015   Sleep apnea    uses CPAP   Stenosis of right internal carotid artery with cerebral infarction ([place]) 12/09/2011   Stress fracture of metatarsal bone 02/18/2012   Stroke ([place]) 1987   Right brain stroke-  slight drop on left side   Syncope 01/12/2020   Type 2 diabetes mellitus with vascular disease ([place]) 05/30/2015   Wears glasses     Past Surgical History:  Procedure Laterality Date   ABDOMINAL AORTOGRAM W/LOWER EXTREMITY Bilateral 11/27/2020   Procedure: ABDOMINAL AORTOGRAM W/LOWER EXTREMITY;  Surgeon: Serafina Mitchell, MD;  Location: [facility];  Service: Cardiovascular;  Laterality: Bilateral;   CARDIAC CATHETERIZATION N/A 05/17/2015   Procedure: Left Heart Cath and Coronary Angiography;  Surgeon: Belva Crome, MD;  Location: [facility];  Service:  Cardiovascular;  Laterality: N/A;   CAROTID ENDARTERECTOMY  1994 & redo 2001   Left   COLONOSCOPY W/ BIOPSIES AND POLYPECTOMY     CORONARY ARTERY BYPASS GRAFT N/A 05/23/2015   Procedure: CORONARY ARTERY BYPASS GRAFTING (CABG) x 3 (LIMA to LAD, SVG to DIAGONAL 2, SVG to PDA) with Endoscopic Vein Harvesting from right greater saphenous vein;  Surgeon: Grace Isaac, MD;  Location: [facility];  Service: Open Heart Surgery;  Laterality: N/A;   ENDARTERECTOMY FEMORAL Right 11/30/2020   Procedure: ENDARTERECTOMY FEMORAL RIGHT;  Surgeon: Rosetta Posner, MD;  Location: [facility];  Service: Vascular;  Laterality: Right;   FEMORAL-POPLITEAL BYPASS GRAFT Right 11/30/2020   Procedure: BYPASS GRAFT FEMORAL-POPLITEAL ARTERY RIGHT;  Surgeon: Rosetta Posner, MD;  Location: [facility];  Service: Vascular;  Laterality: Right;   FRACTURE SURGERY  2013   Right   foo  t X's 2   ILIAC ARTERY STENT     LAPAROSCOPIC CHOLECYSTECTOMY  09/11/2016   POSTERIOR LUMBAR FUSION  Aug. 14, 2014   Level 1   [place] SURGERY  2014   TEE WITHOUT CARDIOVERSION N/A 05/23/2015   Procedure: TRANSESOPHAGEAL ECHOCARDIOGRAM (TEE);  Surgeon: Grace Isaac, MD;  Location: [facility];  Service: Open Heart Surgery;  Laterality: N/A;   TONSILLECTOMY     TRANSCAROTID ARTERY REVASCULARIZATION  Right 06/01/2020   Procedure: RIGHT TRANSCAROTID ARTERY REVASCULARIZATION;  Surgeon: Serafina Mitchell, MD;  Location: [facility];  Service: Vascular;  Laterality: Right;   TRIGGER FINGER RELEASE Right 07/14/2017   Procedure: RIGHT LONG FINGER TRIGGER RELEASE;  Surgeon: Milly Jakob, MD;  Location: [facility];  Service: Orthopedics;  Laterality: Right;    Current Medications: Current Meds  Medication Sig   amLODipine (NORVASC) 10 MG tablet Take 1 tablet (10 mg total) by mouth daily.   apixaban (ELIQUIS) 5 MG TABS tablet Take 1 tablet (5 mg total) by mouth 2 (two) times daily.   aspirin EC 81 MG tablet Take 81  mg by mouth daily. Swallow whole.   atorvastatin (LIPITOR) 40 MG tablet Take 40 mg by mouth daily.   Cholecalciferol (VITAMIN D3) 125 MCG (5000 UT) TABS Take 5,000 Units by mouth daily.    dexlansoprazole (DEXILANT) 60 MG capsule Take 60 mg by mouth daily.    famotidine (PEPCID) 20 MG tablet Take 20 mg by mouth at bedtime as needed.   FIASP FLEXTOUCH 100 UNIT/ML SOPN Inject 20-25 Units into the skin in the morning, at noon, and at bedtime. Sliding scale as per the carbohydrate intake.   gabapentin (NEURONTIN) 300 MG capsule Take 600 mg by mouth at bedtime.    hydrALAZINE (APRESOLINE) 25 MG tablet Take 1 tablet (25 mg total) by mouth in the morning and at bedtime.   hydrocortisone (ANUSOL-HC) 2.5 % rectal cream Place 1 application rectally daily as needed for hemorrhoids.    hydrocortisone 2.5 %  cream Apply 1 application topically 2 (two) times daily as needed (irritation).   ketoconazole (NIZORAL) 2 % shampoo Apply 1 application topically daily as needed for irritation.    L-Methylfolate-Algae-B12-B6 (FOLTANX RF) 3-90.314-2-35 MG CAPS Take 1 capsule by mouth in the morning and at bedtime.    Melatonin 2.5 MG CHEW Chew 2.5 mg by mouth at bedtime.   methocarbamol (ROBAXIN) 500 MG tablet Take 500 mg by mouth every 6 (six) hours as needed for muscle spasms.    metoprolol tartrate (LOPRESSOR) 50 MG tablet TAKE 1 AND 1/2 TABLETS BY MOUTH TWICE DAILY   nitroGLYCERIN (NITROSTAT) 0.3 MG SL tablet Place 1 tablet (0.3 mg total) under the tongue every 5 (five) minutes as needed for chest pain. Only as needed   testosterone cypionate (DEPOTESTOSTERONE CYPIONATE) 200 MG/ML injection Inject 200 mg into the muscle once a week.   torsemide (DEMADEX) 20 MG tablet Take 1 tablet (20 mg total) by mouth daily.   traMADol (ULTRAM) 50 MG tablet Take 1 tablet (50 mg total) by mouth every 6 (six) hours as needed.   TRESIBA FLEXTOUCH 100 UNIT/ML SOPN FlexTouch Pen Inject 25 Units into the skin daily. AM   TRULICITY 2.59  DG/3.8VF SOPN Inject 0.75 mg into the skin once a week. Sunday morning     Allergies:   Doxazosin mesylate, Hydrocodone, Niacin, Oxycodone, Penicillins, and Sulfa drugs cross reactors   Social History   Socioeconomic History   Marital status: Married    Spouse name: Not on file   Number of children: 2   Years of education: Not on file   Highest education level: Not on file  Occupational History   Not on file  Tobacco Use   Smoking status: Former    Packs/day: 2.00    Years: 50.00    Total pack years: 100.00    Types: Cigarettes    Quit date: 03/26/2009    Years since quitting: 13.1   Smokeless tobacco: Never  Vaping Use   Vaping Use: Never used  Substance and Sexual Activity   Alcohol use: No   Drug use: No   Sexual activity: Not on file  Other Topics Concern   Not on file  Social History Narrative   Lives with wife   Caffeine coffee 3 c daily, diet coke   Social Determinants of Health   Financial Resource Strain: Not on file  Food Insecurity: Not on file  Transportation Needs: Not on file  Physical Activity: Not on file  Stress: Not on file  Social Connections: Not on file     Family History: The patient's family history includes Cancer (age of onset: 3) in his mother; Deep vein thrombosis in his father; Hyperlipidemia in his father and mother; Hypertension in his father; Stroke (age of onset: 7) in his father.  ROS:   Please see the history of present illness.    He gets reflux at night when he lies down but has to have his legs elevated to decrease swelling.  All other systems reviewed and are negative.  EKGs/Labs/Other Studies Reviewed:    The following studies were reviewed today: No recent cardiac imaging  EKG:  EKG right bundle branch block, left axis deviation, and when compared to prior tracing from May 2022, the right bundle branch block and left anterior hemiblock are new.  Recent Labs: No results found for requested labs within last 365 days.   Recent Lipid Panel    Component Value Date/Time   CHOL 88 12/01/2020 0230  TRIG 163 (H) 12/01/2020 0230   HDL 24 (L) 12/01/2020 0230   CHOLHDL 3.7 12/01/2020 0230   VLDL 33 12/01/2020 0230   LDLCALC 31 12/01/2020 0230    Physical Exam:    VS:  BP 116/60   Pulse 61   Ht 6' (1.829 m)   Wt 229 lb 3.2 oz (104 kg)   SpO2 97%   BMI 31.09 kg/m     Wt Readings from Last 3 Encounters:  05/15/22 229 lb 3.2 oz (104 kg)  04/09/22 227 lb 4 oz (103.1 kg)  02/04/22 220 lb (99.8 kg)     GEN: Obese. No acute distress HEENT: Normal NECK: No JVD. LYMPHATICS: No lymphadenopathy CARDIAC: No murmur. RRR S4 but no S3 gallop, or left leg edema but there is 2+ knee to ankle right lower extremity edema. VASCULAR: Normal Pulses. No bruits. RESPIRATORY:  Clear to auscultation without rales, wheezing or rhonchi  ABDOMEN: Soft, non-tender, non-distended, No pulsatile mass, MUSCULOSKELETAL: No deformity  SKIN: Warm and dry NEUROLOGIC:  Alert and oriented x 3 PSYCHIATRIC:  Normal affect   ASSESSMENT:    1. Coronary artery disease involving coronary bypass graft of native heart with angina pectoris ([place])   2. Atherosclerosis of native artery of both lower extremities with intermittent claudication ([place])   3. Bilateral extracranial carotid artery stenosis   4. PAF (paroxysmal atrial fibrillation) ([place])   5. Essential hypertension   6. Hyperlipidemia, unspecified hyperlipidemia type   7. OSA on CPAP   8. CKD (chronic kidney disease) stage 4, GFR 15-29 ml/min ([name])   9. Dyspnea on exertion    PLAN:    In order of problems listed above:  Secondary prevention discussed.  He is unable to exercise much because of claudication.  I encouraged stationary biking. Aerobic activity is much as possible No neurological complaints Continue Eliquis. Blood pressure control is adequate.  Continue amlodipine, a Apresoline, and metoprolol. Continue atorvastatin 40 mg/day Continue CPAP Did not check  laboratory data.  Most recent creatinine was 2.28. 2D Doppler echo to assess LV function both systolic and diastolic.     Medication Adjustments/Labs and Tests Ordered: Current medicines are reviewed at length with the patient today.  Concerns regarding medicines are outlined above.  Orders Placed This Encounter  Procedures   ECHOCARDIOGRAM COMPLETE   No orders of the defined types were placed in this encounter.   Patient Instructions  Medication Instructions:  Your physician recommends that you continue on your current medications as directed. Please refer to the Current Medication list given to you today.  *If you need a refill on your cardiac medications before your next appointment, please call your pharmacy*  Lab Work: NONE  Testing/Procedures: Your physician has requested that you have an echocardiogram within the next 6 months (October 2023-April 2024). Echocardiography is a painless test that uses sound waves to create images of your heart. It provides your doctor with information about the size and shape of your heart and how well your heart's chambers and valves are working. This procedure takes approximately one hour. There are no restrictions for this procedure.  Follow-Up: At [facility], you and your health needs are our priority.  As part of our continuing mission to provide you with exceptional heart care, we have created designated Provider Care Teams.  These Care Teams include your primary Cardiologist (physician) and Advanced Practice Providers (APPs -  Physician Assistants and Nurse Practitioners) who all work together to provide you with the care you need,  when you need it.  Your next appointment:   1 year(s)  The format for your next appointment:   In Person  Provider:   Sinclair Grooms, MD    Important Information About Sugar         Signed, Sinclair Grooms, MD  05/15/2022 2:10 PM    [place]

## 2022-05-15 ENCOUNTER — Encounter: Payer: Self-pay | Admitting: Interventional Cardiology

## 2022-05-15 ENCOUNTER — Ambulatory Visit: Payer: PPO | Attending: Interventional Cardiology | Admitting: Interventional Cardiology

## 2022-05-15 VITALS — BP 116/60 | HR 61 | Ht 72.0 in | Wt 229.2 lb

## 2022-05-15 DIAGNOSIS — I25709 Atherosclerosis of coronary artery bypass graft(s), unspecified, with unspecified angina pectoris: Secondary | ICD-10-CM

## 2022-05-15 DIAGNOSIS — R0609 Other forms of dyspnea: Secondary | ICD-10-CM

## 2022-05-15 DIAGNOSIS — I480 Paroxysmal atrial fibrillation: Secondary | ICD-10-CM

## 2022-05-15 DIAGNOSIS — N184 Chronic kidney disease, stage 4 (severe): Secondary | ICD-10-CM | POA: Diagnosis not present

## 2022-05-15 DIAGNOSIS — I70213 Atherosclerosis of native arteries of extremities with intermittent claudication, bilateral legs: Secondary | ICD-10-CM | POA: Diagnosis not present

## 2022-05-15 DIAGNOSIS — I6523 Occlusion and stenosis of bilateral carotid arteries: Secondary | ICD-10-CM

## 2022-05-15 DIAGNOSIS — I10 Essential (primary) hypertension: Secondary | ICD-10-CM

## 2022-05-15 DIAGNOSIS — G4733 Obstructive sleep apnea (adult) (pediatric): Secondary | ICD-10-CM

## 2022-05-15 DIAGNOSIS — E785 Hyperlipidemia, unspecified: Secondary | ICD-10-CM | POA: Diagnosis not present

## 2022-05-15 NOTE — Patient Instructions (Addendum)
Medication Instructions:  Your physician recommends that you continue on your current medications as directed. Please refer to the Current Medication list given to you today.  *If you need a refill on your cardiac medications before your next appointment, please call your pharmacy*  Lab Work: NONE  Testing/Procedures: Your physician has requested that you have an echocardiogram within the next 6 months (October 2023-April 2024). Echocardiography is a painless test that uses sound waves to create images of your heart. It provides your doctor with information about the size and shape of your heart and how well your heart's chambers and valves are working. This procedure takes approximately one hour. There are no restrictions for this procedure.  Follow-Up: At [facility], you and your health needs are our priority.  As part of our continuing mission to provide you with exceptional heart care, we have created designated Provider Care Teams.  These Care Teams include your primary Cardiologist (physician) and Advanced Practice Providers (APPs -  Physician Assistants and Nurse Practitioners) who all work together to provide you with the care you need, when you need it.  Your next appointment:   1 year(s)  The format for your next appointment:   In Person  Provider:   Sinclair Grooms, MD    Important Information About Sugar

## 2022-05-19 NOTE — Addendum Note (Signed)
Addended by: Janan Halter F on: 05/19/2022 08:58 AM   Modules accepted: Orders

## 2022-05-19 NOTE — Addendum Note (Signed)
Addended byDanielle Dess on: 05/19/2022 09:39 AM   Modules accepted: Orders

## 2022-05-20 DIAGNOSIS — R319 Hematuria, unspecified: Secondary | ICD-10-CM | POA: Diagnosis not present

## 2022-05-20 DIAGNOSIS — Z6831 Body mass index (BMI) 31.0-31.9, adult: Secondary | ICD-10-CM | POA: Diagnosis not present

## 2022-05-20 DIAGNOSIS — R310 Gross hematuria: Secondary | ICD-10-CM | POA: Diagnosis not present

## 2022-05-27 DIAGNOSIS — R310 Gross hematuria: Secondary | ICD-10-CM | POA: Diagnosis not present

## 2022-05-27 DIAGNOSIS — R350 Frequency of micturition: Secondary | ICD-10-CM | POA: Diagnosis not present

## 2022-05-27 DIAGNOSIS — N400 Enlarged prostate without lower urinary tract symptoms: Secondary | ICD-10-CM | POA: Diagnosis not present

## 2022-06-03 ENCOUNTER — Ambulatory Visit (HOSPITAL_BASED_OUTPATIENT_CLINIC_OR_DEPARTMENT_OTHER): Payer: PPO | Attending: Neurosurgery | Admitting: Physical Therapy

## 2022-06-03 ENCOUNTER — Ambulatory Visit: Payer: PPO | Admitting: Podiatry

## 2022-06-03 ENCOUNTER — Encounter: Payer: Self-pay | Admitting: Podiatry

## 2022-06-03 ENCOUNTER — Encounter (HOSPITAL_BASED_OUTPATIENT_CLINIC_OR_DEPARTMENT_OTHER): Payer: Self-pay | Admitting: Physical Therapy

## 2022-06-03 DIAGNOSIS — D689 Coagulation defect, unspecified: Secondary | ICD-10-CM

## 2022-06-03 DIAGNOSIS — R2689 Other abnormalities of gait and mobility: Secondary | ICD-10-CM | POA: Diagnosis not present

## 2022-06-03 DIAGNOSIS — E1159 Type 2 diabetes mellitus with other circulatory complications: Secondary | ICD-10-CM | POA: Diagnosis not present

## 2022-06-03 DIAGNOSIS — I739 Peripheral vascular disease, unspecified: Secondary | ICD-10-CM | POA: Diagnosis not present

## 2022-06-03 DIAGNOSIS — E1149 Type 2 diabetes mellitus with other diabetic neurological complication: Secondary | ICD-10-CM | POA: Diagnosis not present

## 2022-06-03 DIAGNOSIS — E1142 Type 2 diabetes mellitus with diabetic polyneuropathy: Secondary | ICD-10-CM | POA: Insufficient documentation

## 2022-06-03 DIAGNOSIS — M542 Cervicalgia: Secondary | ICD-10-CM | POA: Diagnosis not present

## 2022-06-03 DIAGNOSIS — M4712 Other spondylosis with myelopathy, cervical region: Secondary | ICD-10-CM | POA: Insufficient documentation

## 2022-06-03 DIAGNOSIS — E1140 Type 2 diabetes mellitus with diabetic neuropathy, unspecified: Secondary | ICD-10-CM | POA: Insufficient documentation

## 2022-06-03 NOTE — Therapy (Addendum)
OUTPATIENT PHYSICAL THERAPY CERVICAL EVALUATION   Patient Name: Bradley [name] MRN: [id number] DOB:1946/02/23, 76 y.o., male Today's Date: 06/04/2022   PT End of Session - 06/04/22 1118     Visit Number 1    Number of Visits 12    Date for PT Re-Evaluation 07/16/22    Authorization Type HTA 15 dollars    PT Start Time 1600    PT Stop Time 1650    PT Time Calculation (min) 50 min    Activity Tolerance Patient tolerated treatment well    Behavior During Therapy [facility] for tasks assessed/performed             Past Medical History:  Diagnosis Date   Acquired equinus deformity of foot 12/15/2011   Anemia    Arthritis    Carotid artery occlusion    a. Carotid US 10/16: RICA 60-79%, L CEA patent with 1-39% stenosis   CKD (chronic kidney disease)    stage 3-4   Coagulation disorder ([name]) 05/25/2019   Coronary artery disease    a. Myoview 9/16:  EF 52%, inferior fixed defect consistent with diaphragmatic attenuation, intermediate risk secondary to poor exercise tolerance and symptoms during stress;  b. LHC 05/17/2015 90% mid LAD, 80% ost D2, 75% mid RCA, 35% prox RCA. >> S/p CABG   Coronary artery disease involving coronary bypass graft of native heart with angina pectoris ([place])    CORONARY ARTERY BYPASS GRAFTING x 3 - 05/23/2015 Left internal mammary artery to left anterior descending  Saphenous vein graft to diagonal  Saphenous vein graft to posterior descending Endoscopic greater saphenous vein harvest right thigh     Diabetes mellitus age 4   Diverticulitis    Diverticulosis of colon without hemorrhage 05/17/2015   By CT scan    DM type 2, controlled, with complication ([place]) 25/12/3974   Essential hypertension 04/29/2015   GERD (gastroesophageal reflux disease)    H/O hiatal hernia    History of echocardiogram    a. Echo 9/16: GLS -15.2%, EF 73-41%, grade 1 diastolic dysfunction, normal wall motion, aortic sclerosis, dilated aortic root 39 mm, atrial septal lipomatous  hypertrophy   Hyperlipidemia    Hypertension    Irritable bowel syndrome (IBS)    Myocardial infarction ([place])    silent inferior MI; patient denies MI history (03/17/13)    Neuralgia    Neuropathy 2013   OSA on CPAP 05/30/2015   Osteoporosis 2013   PAF (paroxysmal atrial fibrillation) ([place])    post CABG 2016; Amiodarone stopped 2/2 wheezing; PAF 01/12/20   Peripheral vascular disease ([place])    Plantar fasciitis 12/15/2011   Reflux esophagitis    Right bundle branch block 04/30/2015   Sleep apnea    uses CPAP   Stenosis of right internal carotid artery with cerebral infarction ([place]) 12/09/2011   Stress fracture of metatarsal bone 02/18/2012   Stroke ([place]) 1987   Right brain stroke- slight drop on left side   Syncope 01/12/2020   Type 2 diabetes mellitus with vascular disease ([place]) 05/30/2015   Wears glasses    Past Surgical History:  Procedure Laterality Date   ABDOMINAL AORTOGRAM W/LOWER EXTREMITY Bilateral 11/27/2020   Procedure: ABDOMINAL AORTOGRAM W/LOWER EXTREMITY;  Surgeon: Serafina Mitchell, MD;  Location: [place] [facility];  Service: Cardiovascular;  Laterality: Bilateral;   CARDIAC CATHETERIZATION N/A 05/17/2015   Procedure: Left Heart Cath and Coronary Angiography;  Surgeon: Belva Crome, MD;  Location: [place] [facility];  Service: Cardiovascular;  Laterality: N/A;  CAROTID ENDARTERECTOMY  1994 & redo 2001   Left   COLONOSCOPY W/ BIOPSIES AND POLYPECTOMY     CORONARY ARTERY BYPASS GRAFT N/A 05/23/2015   Procedure: CORONARY ARTERY BYPASS GRAFTING (CABG) x 3 (LIMA to LAD, SVG to DIAGONAL 2, SVG to PDA) with Endoscopic Vein Harvesting from right greater saphenous vein;  Surgeon: Grace Isaac, MD;  Location: [place];  Service: Open Heart Surgery;  Laterality: N/A;   ENDARTERECTOMY FEMORAL Right 11/30/2020   Procedure: ENDARTERECTOMY FEMORAL RIGHT;  Surgeon: Rosetta Posner, MD;  Location: [place];  Service: Vascular;  Laterality: Right;   FEMORAL-POPLITEAL BYPASS GRAFT Right  11/30/2020   Procedure: BYPASS GRAFT FEMORAL-POPLITEAL ARTERY RIGHT;  Surgeon: Rosetta Posner, MD;  Location: [place];  Service: Vascular;  Laterality: Right;   FRACTURE SURGERY  2013   Right   foo  t X's 2   ILIAC ARTERY STENT     LAPAROSCOPIC CHOLECYSTECTOMY  09/11/2016   POSTERIOR LUMBAR FUSION  Aug. 14, 2014   Level 1   [place] SURGERY  2014   TEE WITHOUT CARDIOVERSION N/A 05/23/2015   Procedure: TRANSESOPHAGEAL ECHOCARDIOGRAM (TEE);  Surgeon: Grace Isaac, MD;  Location: [place];  Service: Open Heart Surgery;  Laterality: N/A;   TONSILLECTOMY     TRANSCAROTID ARTERY REVASCULARIZATION  Right 06/01/2020   Procedure: RIGHT TRANSCAROTID ARTERY REVASCULARIZATION;  Surgeon: Serafina Mitchell, MD;  Location: [facility];  Service: Vascular;  Laterality: Right;   TRIGGER FINGER RELEASE Right 07/14/2017   Procedure: RIGHT LONG FINGER TRIGGER RELEASE;  Surgeon: Milly Jakob, MD;  Location: [place];  Service: Orthopedics;  Laterality: Right;   Patient Active Problem List   Diagnosis Date Noted   Diabetic neuropathy ([place]) 06/03/2022   PAD (peripheral artery disease) ([place]) 11/30/2020   Carotid stenosis 06/01/2020   Carotid stenosis, asymptomatic 06/01/2020   Syncope 01/12/2020   Coagulation disorder ([place]) 05/25/2019   Coronary artery disease involving coronary bypass graft of native heart with angina pectoris ([name])    PAF (paroxysmal atrial fibrillation) ([name])    Type 2 diabetes mellitus with vascular disease ([place]) 05/30/2015   OSA on CPAP 05/30/2015   Abnormal stress test 05/18/2015   Diverticulosis of colon without hemorrhage 05/17/2015   Right bundle branch block 04/30/2015   Essential hypertension 04/29/2015   Hyperlipidemia 04/29/2015   DM type 2, controlled, with complication ([place]) 94/80/1655   Peripheral vascular disease ([place]) 06/08/2012   Stress fracture of metatarsal bone 02/18/2012   Acquired equinus deformity of foot  12/15/2011   Plantar fasciitis 12/15/2011   Stenosis of right internal carotid artery with cerebral infarction ([place]) 12/09/2011    PCP: Lona Kettle MD   REFERRING PROVIDER: Walker Shadow MD  REFERRING DIAG: [phone] (ICD-10-CM) - Other spondylosis with myelopathy, cervical region  THERAPY DIAG:  Cervicalgia  Other abnormalities of gait and mobility  Rationale for Evaluation and Treatment Rehabilitation  ONSET DATE:   SUBJECTIVE:  SUBJECTIVE STATEMENT: The patient has had a decline in balance over the past month. Catha Gosselin has had 2 major falls which have led to an increase in low back pain and neck pain. He has a history of low back and neck pain but they have increased recently.   PERTINENT HISTORY:  Anemia, acquired equinus deformity; CAD< CKD, DMII, IBS, MI, MI, neuralgia, PF, carotid stenosis Stroke, Torn Rotator cuff; CABG; vein bypass graft 1998; Spine surgery in 2014     PAIN:  Are you having pain? Yes: NPRS scale: 3/10 Pain location: right side of the neck and head  Pain description: aching  Aggravating factors: just comes on  Relieving factors: goes away with time   PRECAUTIONS: Fall  WEIGHT BEARING RESTRICTIONS: No  FALLS:  Has patient fallen in last 6 months? Yes. Number of falls 2 falls both reaching forward    LIVING ENVIRONMENT: Lives with wife; some steps into the house and stairs inside OCCUPATION: retried   Goes to a gym in [place]:   To have improved balance and to have less neck pain    OBJECTIVE:   DIAGNOSTIC FINDINGS:  Cervical MRI  MRI cervical spine (without) demonstrating: - At C3-4 disc bulging and uncovertebral joint hypertrophy with moderate spinal stenosis and severe bilateral foraminal stenosis;  subtle myelomalacia noted within the spinal cord on the left side. - At C4-5 disc bulging and uncovertebral joint hypertrophy with moderate to severe spinal stenosis and moderate bilateral foraminal stenosis. - At C5-6 disc bulging and facet hypertrophy with moderate spinal stenosis and severe bilateral foraminal stenosis.  Lumbar MRI: 9/10 MRI lumbar spine without contrast demonstrating: - At L5-S1: Anterior spondylolisthesis of L5 on S1 resulting in severe bilateral foraminal stenosis; posterior lumbar interbody fusion and metal hardware as above. - At L2-3: Disc bulging with mild spinal stenosis and mild bilateral foraminal stenosis. - At L4-5: Disc bulging with mild bilateral foraminal stenosis.  PATIENT SURVEYS:  FOTO    COGNITION: Overall cognitive status: Within functional limits for tasks assessed  SENSATION:   POSTURE: rounded shoulders and forward head  PALPATION: Spasming of the upper trap    CERVICAL ROM:   Active ROM A/PROM (deg) eval  Flexion   Extension   Right lateral flexion   Left lateral flexion   Right rotation 45 if he dips his neck he can get 65   Left rotation 45   (Blank rows = not tested)  UPPER EXTREMITY   MMT Right eval Left eval  Shoulder flexion 4/5 4/5  Shoulder extension    Shoulder abduction    Shoulder adduction    Shoulder extension    Shoulder internal rotation 4/5 4/5  Shoulder external rotation 4/5 4/5  Elbow flexion    Elbow extension    Wrist flexion    Wrist extension    Wrist ulnar deviation    Wrist radial deviation    Wrist pronation    Wrist supination     (Blank rows = not tested)  Grip right 40 lbs  Grip left 50 lbs    UPPER EXTREMITY MMT:  AROM  Right eval Left eval  Shoulder flexion 140 140  Shoulder extension    Shoulder abduction    Shoulder adduction    Shoulder extension    Shoulder internal rotation    Shoulder external rotation No pain but stiff  Pain in the left arm   Middle trapezius     Lower trapezius  Elbow flexion    Elbow extension    Wrist flexion    Wrist extension    Wrist ulnar deviation    Wrist radial deviation    Wrist pronation    Wrist supination    Grip strength     (Blank rows = not tested)  LOWER EXTREMITY MMT:    MMT Right eval Left eval  Hip flexion 25.9 25.8  Hip extension    Hip abduction 36.9 36.4  Hip adduction    Hip internal rotation    Hip external rotation    Knee flexion    Knee extension 40 37.5  Ankle dorsiflexion    Ankle plantarflexion    Ankle inversion    Ankle eversion     (Blank rows = not tested)     TODAY'S TREATMENT:                                                                                                                      DATE:  Access Code: [email or web address] URL: [email or web address] Date: 06/04/2022 Prepared by: Carolyne Littles  Exercises - Shoulder extension with resistance - Neutral  - 1 x daily - 7 x weekly - 3 sets - 10 reps - Scapular Retraction with Resistance  - 1 x daily - 7 x weekly - 3 sets - 10 reps - Theracane Over Shoulder  - 1 x daily - 7 x weekly - 3 sets - 10 reps  PATIENT EDUCATION:  Education details: HEP and symptom management; improving balance   Person educated: Patient Education method: Explanation, Demonstration, Tactile cues, Verbal cues, and Handouts Education comprehension: verbalized understanding, returned demonstration, verbal cues required, tactile cues required, and needs further education  HOME EXERCISE PROGRAM: Access Code: [email or web address] URL: [email or web address] Date: 06/04/2022 Prepared by: Carolyne Littles  ASSESSMENT:  CLINICAL IMPRESSION: Patient is a 76 y.o. male who was seen today for physical therapy evaluation and treatment for cervical pain/ headache and decreased balance. He has had 2 major falls over the past 6 months both when he was reaching forward. He has decreased balance with a narrow base and with eyes closed. He has limited  cervical rotation. He has pain reaching overhead on the right side. He has spasming in bilateral upper traps. He would benefit from skilled therapy to improve neck/headache pain and to improve his ability to perform daily tasks. He would also benefit from a balance program.    OBJECTIVE IMPAIRMENTS: Abnormal gait, decreased activity tolerance, decreased endurance, decreased mobility, difficulty walking, decreased ROM, decreased strength, increased fascial restrictions, increased muscle spasms, impaired UE functional use, and pain.   ACTIVITY LIMITATIONS: carrying, lifting, bending, standing, squatting, stairs, transfers, and locomotion level  PARTICIPATION LIMITATIONS: cleaning, laundry, shopping, community activity, and yard work  PERSONAL FACTORS: 3+ comorbidities: past CVA, CABG, arthritis in the back   are also affecting patient's functional outcome.   REHAB POTENTIAL: Good  CLINICAL DECISION MAKING: Evolving/moderate complexity fluctuating pain levels in his neck and declining balance   EVALUATION COMPLEXITY: Moderate  GOALS: Goals reviewed with patient? Yes  SHORT TERM GOALS: Target date: 07/02/2022   Patient will increase cervical rotation by 10 degrees  Baseline:  Goal status: INITIAL  2.  Patient will report a 50% reduction in cervical headache  Baseline:  Goal status: INITIAL  3.  Patient will required CGA for narrow base with eyes open.  Baseline:  Goal status: INITIAL   LONG TERM GOALS: Target date: 07/16/2022  Patient will demonstrate improved balance with all balance tasks to improve safety  Baseline:  Goal status: INITIAL  2.  Patient will demonstrate >65 degrees of cervical rotation without pain in order to improve community safety  Baseline:  Goal status: INITIAL  3.  Patient will be independent with a complete HEP  Baseline:  Goal status: INITIAL   PLAN:  PT FREQUENCY: 2x/week  PT DURATION: 8 weeks  PLANNED INTERVENTIONS: Therapeutic  exercises, Therapeutic activity, Neuromuscular re-education, Balance training, Gait training, Patient/Family education, Self Care, Joint mobilization, DME instructions, Aquatic Therapy, Dry Needling, Electrical stimulation, Cryotherapy, Moist heat, Taping, Ultrasound, and Manual therapy  PLAN FOR NEXT SESSION: see MRI of th neck. Keep all movements gentle and without forcing movement. Work on balance exercises. Give patient a general LE strengthening exercise.    Carney Living, PT 06/04/2022, 11:19 AM

## 2022-06-03 NOTE — Progress Notes (Signed)
This patient presents the office for his annual diabetic foot exam.  This patient has been diagnosed with  PAD and diabetes with vascular disease previously.  He says he has had vascular surgery to improve his vascularity right foot.  He also says he has broken his right foot.  Finally he has skin surgery in his right leg.   Patient does give a history of heart surgery and kidney disease.  Patient is taking insulin and eliquis.Bradley [name]  He presents the office today for an evaluation of his diabetic feet   Vascular  Dorsalis pedis and posterior tibial pulses are weakly   palpable right foot.  Dorsalis pedis and posterior tibial pulses left foot are weakly palpable.   Capillary return  WNL.  Temperature gradient is  WNL.  Skin turgor  WNL  Sensorium  Senn Weinstein monofilament wire  WNL. Normal tactile sensation.  Nail Exam  Patient has normal nails with no evidence of bacterial or fungal infection.  Orthopedic  Exam  Muscle tone and muscle strength  WNL.  No limitations of motion feet  B/L.  No crepitus or joint effusion noted.  Foot type is unremarkable and digits show no abnormalities.  Bony prominences are unremarkable.  Increased temperature and swelling right foot.    Skin  No open lesions.  Normal skin texture and turgor..  Diabetes with vascular disease.  His diabetic foot exam reveals vascular disease  of pedal pulses.  No evidence of any neuropathy feet bilaterally.  I am concerned about his right foot which has significant swelling right foot.  Patient says the swelling is due to vascular surgery.  He wears compression socks .   RTC 1 year   Gardiner Barefoot DPM

## 2022-06-04 ENCOUNTER — Other Ambulatory Visit: Payer: Self-pay

## 2022-06-04 ENCOUNTER — Encounter (HOSPITAL_BASED_OUTPATIENT_CLINIC_OR_DEPARTMENT_OTHER): Payer: Self-pay | Admitting: Physical Therapy

## 2022-06-09 DIAGNOSIS — E291 Testicular hypofunction: Secondary | ICD-10-CM | POA: Diagnosis not present

## 2022-06-09 DIAGNOSIS — I10 Essential (primary) hypertension: Secondary | ICD-10-CM | POA: Diagnosis not present

## 2022-06-09 DIAGNOSIS — E7800 Pure hypercholesterolemia, unspecified: Secondary | ICD-10-CM | POA: Diagnosis not present

## 2022-06-09 DIAGNOSIS — Z125 Encounter for screening for malignant neoplasm of prostate: Secondary | ICD-10-CM | POA: Diagnosis not present

## 2022-06-10 DIAGNOSIS — N200 Calculus of kidney: Secondary | ICD-10-CM | POA: Diagnosis not present

## 2022-06-10 DIAGNOSIS — N3289 Other specified disorders of bladder: Secondary | ICD-10-CM | POA: Diagnosis not present

## 2022-06-10 DIAGNOSIS — R310 Gross hematuria: Secondary | ICD-10-CM | POA: Diagnosis not present

## 2022-06-10 DIAGNOSIS — N281 Cyst of kidney, acquired: Secondary | ICD-10-CM | POA: Diagnosis not present

## 2022-06-10 DIAGNOSIS — K5730 Diverticulosis of large intestine without perforation or abscess without bleeding: Secondary | ICD-10-CM | POA: Diagnosis not present

## 2022-06-11 ENCOUNTER — Ambulatory Visit (HOSPITAL_BASED_OUTPATIENT_CLINIC_OR_DEPARTMENT_OTHER): Payer: PPO | Admitting: Physical Therapy

## 2022-06-12 DIAGNOSIS — Z6831 Body mass index (BMI) 31.0-31.9, adult: Secondary | ICD-10-CM | POA: Diagnosis not present

## 2022-06-12 DIAGNOSIS — N184 Chronic kidney disease, stage 4 (severe): Secondary | ICD-10-CM | POA: Diagnosis not present

## 2022-06-12 DIAGNOSIS — I4891 Unspecified atrial fibrillation: Secondary | ICD-10-CM | POA: Diagnosis not present

## 2022-06-12 DIAGNOSIS — E291 Testicular hypofunction: Secondary | ICD-10-CM | POA: Diagnosis not present

## 2022-06-12 DIAGNOSIS — D6869 Other thrombophilia: Secondary | ICD-10-CM | POA: Diagnosis not present

## 2022-06-12 DIAGNOSIS — E7800 Pure hypercholesterolemia, unspecified: Secondary | ICD-10-CM | POA: Diagnosis not present

## 2022-06-12 DIAGNOSIS — Z0000 Encounter for general adult medical examination without abnormal findings: Secondary | ICD-10-CM | POA: Diagnosis not present

## 2022-06-12 DIAGNOSIS — G629 Polyneuropathy, unspecified: Secondary | ICD-10-CM | POA: Diagnosis not present

## 2022-06-12 DIAGNOSIS — R2681 Unsteadiness on feet: Secondary | ICD-10-CM | POA: Diagnosis not present

## 2022-06-12 DIAGNOSIS — K219 Gastro-esophageal reflux disease without esophagitis: Secondary | ICD-10-CM | POA: Diagnosis not present

## 2022-06-12 DIAGNOSIS — Z79899 Other long term (current) drug therapy: Secondary | ICD-10-CM | POA: Diagnosis not present

## 2022-06-12 DIAGNOSIS — I10 Essential (primary) hypertension: Secondary | ICD-10-CM | POA: Diagnosis not present

## 2022-06-12 DIAGNOSIS — E1142 Type 2 diabetes mellitus with diabetic polyneuropathy: Secondary | ICD-10-CM | POA: Diagnosis not present

## 2022-06-17 DIAGNOSIS — M5412 Radiculopathy, cervical region: Secondary | ICD-10-CM | POA: Diagnosis not present

## 2022-06-17 DIAGNOSIS — M5033 Other cervical disc degeneration, cervicothoracic region: Secondary | ICD-10-CM | POA: Diagnosis not present

## 2022-06-17 DIAGNOSIS — M9903 Segmental and somatic dysfunction of lumbar region: Secondary | ICD-10-CM | POA: Diagnosis not present

## 2022-06-17 DIAGNOSIS — M9901 Segmental and somatic dysfunction of cervical region: Secondary | ICD-10-CM | POA: Diagnosis not present

## 2022-06-18 ENCOUNTER — Ambulatory Visit (HOSPITAL_BASED_OUTPATIENT_CLINIC_OR_DEPARTMENT_OTHER): Payer: PPO | Attending: Neurosurgery | Admitting: Physical Therapy

## 2022-06-18 DIAGNOSIS — M542 Cervicalgia: Secondary | ICD-10-CM | POA: Insufficient documentation

## 2022-06-18 DIAGNOSIS — R2689 Other abnormalities of gait and mobility: Secondary | ICD-10-CM | POA: Insufficient documentation

## 2022-06-24 ENCOUNTER — Ambulatory Visit (HOSPITAL_COMMUNITY): Payer: PPO | Attending: Interventional Cardiology

## 2022-06-24 DIAGNOSIS — I25709 Atherosclerosis of coronary artery bypass graft(s), unspecified, with unspecified angina pectoris: Secondary | ICD-10-CM

## 2022-06-24 DIAGNOSIS — I480 Paroxysmal atrial fibrillation: Secondary | ICD-10-CM | POA: Diagnosis not present

## 2022-06-24 LAB — ECHOCARDIOGRAM COMPLETE
AR max vel: 2.27 cm2
AV Area VTI: 2.31 cm2
AV Area mean vel: 2.25 cm2
AV Mean grad: 5 mmHg
AV Peak grad: 8.7 mmHg
Ao pk vel: 1.48 m/s
Area-P 1/2: 3.51 cm2
S' Lateral: 3.1 cm

## 2022-06-25 DIAGNOSIS — I10 Essential (primary) hypertension: Secondary | ICD-10-CM | POA: Diagnosis not present

## 2022-06-25 DIAGNOSIS — E1142 Type 2 diabetes mellitus with diabetic polyneuropathy: Secondary | ICD-10-CM | POA: Diagnosis not present

## 2022-06-25 DIAGNOSIS — E7800 Pure hypercholesterolemia, unspecified: Secondary | ICD-10-CM | POA: Diagnosis not present

## 2022-06-25 DIAGNOSIS — N184 Chronic kidney disease, stage 4 (severe): Secondary | ICD-10-CM | POA: Diagnosis not present

## 2022-06-25 DIAGNOSIS — I4891 Unspecified atrial fibrillation: Secondary | ICD-10-CM | POA: Diagnosis not present

## 2022-06-26 ENCOUNTER — Ambulatory Visit (HOSPITAL_BASED_OUTPATIENT_CLINIC_OR_DEPARTMENT_OTHER): Payer: PPO | Admitting: Physical Therapy

## 2022-06-26 ENCOUNTER — Encounter (HOSPITAL_BASED_OUTPATIENT_CLINIC_OR_DEPARTMENT_OTHER): Payer: Self-pay | Admitting: Physical Therapy

## 2022-06-26 DIAGNOSIS — R2689 Other abnormalities of gait and mobility: Secondary | ICD-10-CM | POA: Diagnosis not present

## 2022-06-26 DIAGNOSIS — M542 Cervicalgia: Secondary | ICD-10-CM | POA: Diagnosis not present

## 2022-06-26 NOTE — Therapy (Signed)
OUTPATIENT PHYSICAL THERAPY CERVICAL EVALUATION   Patient Name: Bradley [name] MRN: [id number] DOB:[birth year], 76 y.o., male Today's Date: 06/27/2022   PT End of Session - 06/26/22 1521     Visit Number 2    Number of Visits 12    Date for PT Re-Evaluation 07/16/22    Authorization Type HTA 15 dollars    PT Start Time 1430    PT Stop Time 1512    PT Time Calculation (min) 42 min    Activity Tolerance Patient tolerated treatment well    Behavior During Therapy [facility] for tasks assessed/performed              Past Medical History:  Diagnosis Date   Acquired equinus deformity of foot 12/15/2011   Anemia    Arthritis    Carotid artery occlusion    a. Carotid US 10/16: RICA 60-79%, L CEA patent with 1-39% stenosis   CKD (chronic kidney disease)    stage 3-4   Coagulation disorder ([place]) 05/25/2019   Coronary artery disease    a. Myoview 9/16:  EF 52%, inferior fixed defect consistent with diaphragmatic attenuation, intermediate risk secondary to poor exercise tolerance and symptoms during stress;  b. LHC 05/17/2015 90% mid LAD, 80% ost D2, 75% mid RCA, 35% prox RCA. >> S/p CABG   Coronary artery disease involving coronary bypass graft of native heart with angina pectoris ([place])    CORONARY ARTERY BYPASS GRAFTING x 3 - 05/23/2015 Left internal mammary artery to left anterior descending  Saphenous vein graft to diagonal  Saphenous vein graft to posterior descending Endoscopic greater saphenous vein harvest right thigh     Diabetes mellitus age 11   Diverticulitis    Diverticulosis of colon without hemorrhage 05/17/2015   By CT scan    DM type 2, controlled, with complication ([place]) 50/53/9767   Essential hypertension 04/29/2015   GERD (gastroesophageal reflux disease)    H/O hiatal hernia    History of echocardiogram    a. Echo 9/16: GLS -15.2%, EF 34-19%, grade 1 diastolic dysfunction, normal wall motion, aortic sclerosis, dilated aortic root 39 mm, atrial septal lipomatous  hypertrophy   Hyperlipidemia    Hypertension    Irritable bowel syndrome (IBS)    Myocardial infarction ([place])    silent inferior MI; patient denies MI history (03/17/13)    Neuralgia    Neuropathy 2013   OSA on CPAP 05/30/2015   Osteoporosis 2013   PAF (paroxysmal atrial fibrillation) ([place])    post CABG 2016; Amiodarone stopped 2/2 wheezing; PAF 01/12/20   Peripheral vascular disease ([place])    Plantar fasciitis 12/15/2011   Reflux esophagitis    Right bundle branch block 04/30/2015   Sleep apnea    uses CPAP   Stenosis of right internal carotid artery with cerebral infarction ([place]) 12/09/2011   Stress fracture of metatarsal bone 02/18/2012   Stroke ([place]) 1987   Right brain stroke- slight drop on left side   Syncope 01/12/2020   Type 2 diabetes mellitus with vascular disease ([place]) 05/30/2015   Wears glasses    Past Surgical History:  Procedure Laterality Date   ABDOMINAL AORTOGRAM W/LOWER EXTREMITY Bilateral 11/27/2020   Procedure: ABDOMINAL AORTOGRAM W/LOWER EXTREMITY;  Surgeon: Serafina Mitchell, MD;  Location: [facility];  Service: Cardiovascular;  Laterality: Bilateral;   CARDIAC CATHETERIZATION N/A 05/17/2015   Procedure: Left Heart Cath and Coronary Angiography;  Surgeon: Belva Crome, MD;  Location: [facility];  Service: Cardiovascular;  Laterality:  N/A;   CAROTID ENDARTERECTOMY  1994 & redo 2001   Left   COLONOSCOPY W/ BIOPSIES AND POLYPECTOMY     CORONARY ARTERY BYPASS GRAFT N/A 05/23/2015   Procedure: CORONARY ARTERY BYPASS GRAFTING (CABG) x 3 (LIMA to LAD, SVG to DIAGONAL 2, SVG to PDA) with Endoscopic Vein Harvesting from right greater saphenous vein;  Surgeon: Grace Isaac, MD;  Location: [facility];  Service: Open Heart Surgery;  Laterality: N/A;   ENDARTERECTOMY FEMORAL Right 11/30/2020   Procedure: ENDARTERECTOMY FEMORAL RIGHT;  Surgeon: Rosetta Posner, MD;  Location: [facility];  Service: Vascular;  Laterality: Right;   FEMORAL-POPLITEAL BYPASS GRAFT Right  11/30/2020   Procedure: BYPASS GRAFT FEMORAL-POPLITEAL ARTERY RIGHT;  Surgeon: Rosetta Posner, MD;  Location: [facility];  Service: Vascular;  Laterality: Right;   FRACTURE SURGERY  2013   Right   foo  t X's 2   ILIAC ARTERY STENT     LAPAROSCOPIC CHOLECYSTECTOMY  09/11/2016   POSTERIOR LUMBAR FUSION  Aug. 14, 2014   Level 1   [place] SURGERY  2014   TEE WITHOUT CARDIOVERSION N/A 05/23/2015   Procedure: TRANSESOPHAGEAL ECHOCARDIOGRAM (TEE);  Surgeon: Grace Isaac, MD;  Location: [facility];  Service: Open Heart Surgery;  Laterality: N/A;   TONSILLECTOMY     TRANSCAROTID ARTERY REVASCULARIZATION  Right 06/01/2020   Procedure: RIGHT TRANSCAROTID ARTERY REVASCULARIZATION;  Surgeon: Serafina Mitchell, MD;  Location: [facility];  Service: Vascular;  Laterality: Right;   TRIGGER FINGER RELEASE Right 07/14/2017   Procedure: RIGHT LONG FINGER TRIGGER RELEASE;  Surgeon: Milly Jakob, MD;  Location: [facility];  Service: Orthopedics;  Laterality: Right;   Patient Active Problem List   Diagnosis Date Noted   Diabetic neuropathy ([place]) 06/03/2022   PAD (peripheral artery disease) ([place]) 11/30/2020   Carotid stenosis 06/01/2020   Carotid stenosis, asymptomatic 06/01/2020   Syncope 01/12/2020   Coagulation disorder ([place]) 05/25/2019   Coronary artery disease involving coronary bypass graft of native heart with angina pectoris ([name])    PAF (paroxysmal atrial fibrillation) ([name])    Type 2 diabetes mellitus with vascular disease ([place]) 05/30/2015   OSA on CPAP 05/30/2015   Abnormal stress test 05/18/2015   Diverticulosis of colon without hemorrhage 05/17/2015   Right bundle branch block 04/30/2015   Essential hypertension 04/29/2015   Hyperlipidemia 04/29/2015   DM type 2, controlled, with complication ([place]) 89/16/9450   Peripheral vascular disease ([place]) 06/08/2012   Stress fracture of metatarsal bone 02/18/2012   Acquired equinus deformity of foot  12/15/2011   Plantar fasciitis 12/15/2011   Stenosis of right internal carotid artery with cerebral infarction ([place]) 12/09/2011    PCP: Lona Kettle MD   REFERRING PROVIDER: Walker Shadow MD  REFERRING DIAG: [phone] (ICD-10-CM) - Other spondylosis with myelopathy, cervical region  THERAPY DIAG:  Cervicalgia  Other abnormalities of gait and mobility  Rationale for Evaluation and Treatment Rehabilitation  ONSET DATE:   SUBJECTIVE:  SUBJECTIVE STATEMENT: The patient has had a decline in balance over the past month. Catha Gosselin has had 2 major falls which have led to an increase in low back pain and neck pain. He has a history of low back and neck pain but they have increased recently.   PERTINENT HISTORY:  Anemia, acquired equinus deformity; CAD< CKD, DMII, IBS, MI, MI, neuralgia, PF, carotid stenosis Stroke, Torn Rotator cuff; CABG; vein bypass graft 1998; Spine surgery in 2014     PAIN:  Are you having pain? Yes: NPRS scale: 3/10 Pain location: right side of the neck and head  Pain description: aching  Aggravating factors: just comes on  Relieving factors: goes away with time   PRECAUTIONS: Fall  WEIGHT BEARING RESTRICTIONS: No  FALLS:  Has patient fallen in last 6 months? Yes. Number of falls 2 falls both reaching forward    LIVING ENVIRONMENT: Lives with wife; some steps into the house and stairs inside OCCUPATION: retried   Goes to a gym in [place]:   To have improved balance and to have less neck pain    OBJECTIVE:   DIAGNOSTIC FINDINGS:  Cervical MRI  MRI cervical spine (without) demonstrating: - At C3-4 disc bulging and uncovertebral joint hypertrophy with moderate spinal stenosis and severe bilateral foraminal stenosis;  subtle myelomalacia noted within the spinal cord on the left side. - At C4-5 disc bulging and uncovertebral joint hypertrophy with moderate to severe spinal stenosis and moderate bilateral foraminal stenosis. - At C5-6 disc bulging and facet hypertrophy with moderate spinal stenosis and severe bilateral foraminal stenosis.  Lumbar MRI: 9/10 MRI lumbar spine without contrast demonstrating: - At L5-S1: Anterior spondylolisthesis of L5 on S1 resulting in severe bilateral foraminal stenosis; posterior lumbar interbody fusion and metal hardware as above. - At L2-3: Disc bulging with mild spinal stenosis and mild bilateral foraminal stenosis. - At L4-5: Disc bulging with mild bilateral foraminal stenosis.  PATIENT SURVEYS:  FOTO    COGNITION: Overall cognitive status: Within functional limits for tasks assessed  SENSATION:   POSTURE: rounded shoulders and forward head  PALPATION: Spasming of the upper trap    CERVICAL ROM:   Active ROM A/PROM (deg) eval  Flexion   Extension   Right lateral flexion   Left lateral flexion   Right rotation 45 if he dips his neck he can get 65   Left rotation 45   (Blank rows = not tested)  UPPER EXTREMITY   MMT Right eval Left eval  Shoulder flexion 4/5 4/5  Shoulder extension    Shoulder abduction    Shoulder adduction    Shoulder extension    Shoulder internal rotation 4/5 4/5  Shoulder external rotation 4/5 4/5  Elbow flexion    Elbow extension    Wrist flexion    Wrist extension    Wrist ulnar deviation    Wrist radial deviation    Wrist pronation    Wrist supination     (Blank rows = not tested)  Grip right 40 lbs  Grip left 50 lbs    UPPER EXTREMITY MMT:  AROM  Right eval Left eval  Shoulder flexion 140 140  Shoulder extension    Shoulder abduction    Shoulder adduction    Shoulder extension    Shoulder internal rotation    Shoulder external rotation No pain but stiff  Pain in the left arm   Middle trapezius     Lower trapezius  Elbow flexion    Elbow extension    Wrist flexion    Wrist extension    Wrist ulnar deviation    Wrist radial deviation    Wrist pronation    Wrist supination    Grip strength     (Blank rows = not tested)  LOWER EXTREMITY MMT:    MMT Right eval Left eval  Hip flexion 25.9 25.8  Hip extension    Hip abduction 36.9 36.4  Hip adduction    Hip internal rotation    Hip external rotation    Knee flexion    Knee extension 40 37.5  Ankle dorsiflexion    Ankle plantarflexion    Ankle inversion    Ankle eversion     (Blank rows = not tested)     TODAY'S TREATMENT:                                                                                                                      DATE:  11/16 Manual: trigger point release to upper traps and cerivcal paraspinals; gentle manual traction  Upper trap stretch 3 x 20 seconds bilateral Levator stretch 3 x 20 seconds bilateral Mild cueing for each for technique   Seated bilateral external rotation red band 2x15 Standing row red band 2x15 Standing extension red band 2x15  Balance exercises Narrow base of support eyes open 2 x 30 seconds Narrow base of support eyes closed 2 x 30 seconds Tandem stance each leg 2 x 30 seconds more difficulty noted with right leg back   Initial eval  Access Code: [email or web address] URL: [email or web address] Date: 06/04/2022 Prepared by: Carolyne Littles  Exercises - Shoulder extension with resistance - Neutral  - 1 x daily - 7 x weekly - 3 sets - 10 reps - Scapular Retraction with Resistance  - 1 x daily - 7 x weekly - 3 sets - 10 reps - Theracane Over Shoulder  - 1 x daily - 7 x weekly - 3 sets - 10 reps  PATIENT EDUCATION:  Education details: HEP and symptom management; improving balance   Person educated: Patient Education method: Explanation, Demonstration, Tactile cues, Verbal cues, and Handouts Education comprehension: verbalized understanding, returned  demonstration, verbal cues required, tactile cues required, and needs further education  HOME EXERCISE PROGRAM: Access Code: [email or web address] URL: [email or web address] Date: 06/04/2022 Prepared by: Carolyne Littles  ASSESSMENT:  CLINICAL IMPRESSION: The patient is neck is present progressing well.  Following manual therapy the patient had full range of motion.  The patient continues to have trigger points in his upper traps but they have reduced significantly since initial evaluation.  He was advised to continue to perform pain-free rotation of his neck 2-3 times a day 2-3 times.  We updated his exercise program to continue to work on postural muscle.  We also initiated a general balance program.  We emphasize safety at home performing the balance exercises.  He had the most difficulty with eyes closed activity and also activity where the right  leg was a primary stability leg.  We will continue to progress as tolerated.  OBJECTIVE IMPAIRMENTS: Abnormal gait, decreased activity tolerance, decreased endurance, decreased mobility, difficulty walking, decreased ROM, decreased strength, increased fascial restrictions, increased muscle spasms, impaired UE functional use, and pain.   ACTIVITY LIMITATIONS: carrying, lifting, bending, standing, squatting, stairs, transfers, and locomotion level  PARTICIPATION LIMITATIONS: cleaning, laundry, shopping, community activity, and yard work  PERSONAL FACTORS: 3+ comorbidities: past CVA, CABG, arthritis in the back   are also affecting patient's functional outcome.   REHAB POTENTIAL: Good  CLINICAL DECISION MAKING: Evolving/moderate complexity fluctuating pain levels in his neck and declining balance   EVALUATION COMPLEXITY: Moderate   GOALS: Goals reviewed with patient? Yes  SHORT TERM GOALS: Target date: 07/25/2022   Patient will increase cervical rotation by 10 degrees  Baseline:  Goal status: INITIAL  2.  Patient will report a 50%  reduction in cervical headache  Baseline:  Goal status: INITIAL  3.  Patient will required CGA for narrow base with eyes open.  Baseline:  Goal status: INITIAL   LONG TERM GOALS: Target date: 08/08/2022  Patient will demonstrate improved balance with all balance tasks to improve safety  Baseline:  Goal status: INITIAL  2.  Patient will demonstrate >65 degrees of cervical rotation without pain in order to improve community safety  Baseline:  Goal status: INITIAL  3.  Patient will be independent with a complete HEP  Baseline:  Goal status: INITIAL   PLAN:  PT FREQUENCY: 2x/week  PT DURATION: 8 weeks  PLANNED INTERVENTIONS: Therapeutic exercises, Therapeutic activity, Neuromuscular re-education, Balance training, Gait training, Patient/Family education, Self Care, Joint mobilization, DME instructions, Aquatic Therapy, Dry Needling, Electrical stimulation, Cryotherapy, Moist heat, Taping, Ultrasound, and Manual therapy  PLAN FOR NEXT SESSION: see MRI of th neck. Keep all movements gentle and without forcing movement. Work on balance exercises. Give patient a general LE strengthening exercise.    Carney Living, PT 06/27/2022, 12:34 PM

## 2022-06-27 ENCOUNTER — Encounter (HOSPITAL_BASED_OUTPATIENT_CLINIC_OR_DEPARTMENT_OTHER): Payer: Self-pay | Admitting: Physical Therapy

## 2022-07-01 DIAGNOSIS — M9903 Segmental and somatic dysfunction of lumbar region: Secondary | ICD-10-CM | POA: Diagnosis not present

## 2022-07-01 DIAGNOSIS — M5412 Radiculopathy, cervical region: Secondary | ICD-10-CM | POA: Diagnosis not present

## 2022-07-01 DIAGNOSIS — M9901 Segmental and somatic dysfunction of cervical region: Secondary | ICD-10-CM | POA: Diagnosis not present

## 2022-07-01 DIAGNOSIS — M5033 Other cervical disc degeneration, cervicothoracic region: Secondary | ICD-10-CM | POA: Diagnosis not present

## 2022-07-02 ENCOUNTER — Ambulatory Visit (HOSPITAL_BASED_OUTPATIENT_CLINIC_OR_DEPARTMENT_OTHER): Payer: PPO | Admitting: Physical Therapy

## 2022-07-02 ENCOUNTER — Encounter (HOSPITAL_BASED_OUTPATIENT_CLINIC_OR_DEPARTMENT_OTHER): Payer: Self-pay | Admitting: Physical Therapy

## 2022-07-02 DIAGNOSIS — M542 Cervicalgia: Secondary | ICD-10-CM

## 2022-07-02 DIAGNOSIS — R2689 Other abnormalities of gait and mobility: Secondary | ICD-10-CM

## 2022-07-02 NOTE — Therapy (Signed)
OUTPATIENT PHYSICAL THERAPY CERVICAL Treatment    Patient Name: Bradley [name] MRN: [id number] DOB:Mar 22, 1946, 76 y.o., male Today's Date: 06/27/2022   PT End of Session - 06/26/22 1521     Visit Number 2    Number of Visits 12    Date for PT Re-Evaluation 07/16/22    Authorization Type HTA 15 dollars    PT Start Time 1430    PT Stop Time 1512    PT Time Calculation (min) 42 min    Activity Tolerance Patient tolerated treatment well    Behavior During Therapy [facility] for tasks assessed/performed              Past Medical History:  Diagnosis Date   Acquired equinus deformity of foot 12/15/2011   Anemia    Arthritis    Carotid artery occlusion    a. Carotid US 10/16: RICA 60-79%, L CEA patent with 1-39% stenosis   CKD (chronic kidney disease)    stage 3-4   Coagulation disorder ([place]) 05/25/2019   Coronary artery disease    a. Myoview 9/16:  EF 52%, inferior fixed defect consistent with diaphragmatic attenuation, intermediate risk secondary to poor exercise tolerance and symptoms during stress;  b. LHC 05/17/2015 90% mid LAD, 80% ost D2, 75% mid RCA, 35% prox RCA. >> S/p CABG   Coronary artery disease involving coronary bypass graft of native heart with angina pectoris ([name])    CORONARY ARTERY BYPASS GRAFTING x 3 - 05/23/2015 Left internal mammary artery to left anterior descending  Saphenous vein graft to diagonal  Saphenous vein graft to posterior descending Endoscopic greater saphenous vein harvest right thigh     Diabetes mellitus age 37   Diverticulitis    Diverticulosis of colon without hemorrhage 05/17/2015   By CT scan    DM type 2, controlled, with complication ([name]) 14/48/1856   Essential hypertension 04/29/2015   GERD (gastroesophageal reflux disease)    H/O hiatal hernia    History of echocardiogram    a. Echo 9/16: GLS -15.2%, EF 31-49%, grade 1 diastolic dysfunction, normal wall motion, aortic sclerosis, dilated aortic root 39 mm, atrial septal lipomatous  hypertrophy   Hyperlipidemia    Hypertension    Irritable bowel syndrome (IBS)    Myocardial infarction ([place])    silent inferior MI; patient denies MI history (03/17/13)    Neuralgia    Neuropathy 2013   OSA on CPAP 05/30/2015   Osteoporosis 2013   PAF (paroxysmal atrial fibrillation) ([place])    post CABG 2016; Amiodarone stopped 2/2 wheezing; PAF 01/12/20   Peripheral vascular disease ([place])    Plantar fasciitis 12/15/2011   Reflux esophagitis    Right bundle branch block 04/30/2015   Sleep apnea    uses CPAP   Stenosis of right internal carotid artery with cerebral infarction ([name]) 12/09/2011   Stress fracture of metatarsal bone 02/18/2012   Stroke ([place]) 1987   Right brain stroke- slight drop on left side   Syncope 01/12/2020   Type 2 diabetes mellitus with vascular disease ([place]) 05/30/2015   Wears glasses    Past Surgical History:  Procedure Laterality Date   ABDOMINAL AORTOGRAM W/LOWER EXTREMITY Bilateral 11/27/2020   Procedure: ABDOMINAL AORTOGRAM W/LOWER EXTREMITY;  Surgeon: Serafina Mitchell, MD;  Location: [facility];  Service: Cardiovascular;  Laterality: Bilateral;   CARDIAC CATHETERIZATION N/A 05/17/2015   Procedure: Left Heart Cath and Coronary Angiography;  Surgeon: Belva Crome, MD;  Location: [place] [facility];  Service: Cardiovascular;  Laterality: N/A;   CAROTID ENDARTERECTOMY  1994 & redo 2001   Left   COLONOSCOPY W/ BIOPSIES AND POLYPECTOMY     CORONARY ARTERY BYPASS GRAFT N/A 05/23/2015   Procedure: CORONARY ARTERY BYPASS GRAFTING (CABG) x 3 (LIMA to LAD, SVG to DIAGONAL 2, SVG to PDA) with Endoscopic Vein Harvesting from right greater saphenous vein;  Surgeon: Grace Isaac, MD;  Location: [facility];  Service: Open Heart Surgery;  Laterality: N/A;   ENDARTERECTOMY FEMORAL Right 11/30/2020   Procedure: ENDARTERECTOMY FEMORAL RIGHT;  Surgeon: Rosetta Posner, MD;  Location: [facility];  Service: Vascular;  Laterality: Right;   FEMORAL-POPLITEAL BYPASS GRAFT Right  11/30/2020   Procedure: BYPASS GRAFT FEMORAL-POPLITEAL ARTERY RIGHT;  Surgeon: Rosetta Posner, MD;  Location: [facility];  Service: Vascular;  Laterality: Right;   FRACTURE SURGERY  2013   Right   foo  t X's 2   ILIAC ARTERY STENT     LAPAROSCOPIC CHOLECYSTECTOMY  09/11/2016   POSTERIOR LUMBAR FUSION  Aug. 14, 2014   Level 1   [place] SURGERY  2014   TEE WITHOUT CARDIOVERSION N/A 05/23/2015   Procedure: TRANSESOPHAGEAL ECHOCARDIOGRAM (TEE);  Surgeon: Grace Isaac, MD;  Location: [facility] [facility];  Service: Open Heart Surgery;  Laterality: N/A;   TONSILLECTOMY     TRANSCAROTID ARTERY REVASCULARIZATION  Right 06/01/2020   Procedure: RIGHT TRANSCAROTID ARTERY REVASCULARIZATION;  Surgeon: Serafina Mitchell, MD;  Location: [facility];  Service: Vascular;  Laterality: Right;   TRIGGER FINGER RELEASE Right 07/14/2017   Procedure: RIGHT LONG FINGER TRIGGER RELEASE;  Surgeon: Milly Jakob, MD;  Location: [facility];  Service: Orthopedics;  Laterality: Right;   Patient Active Problem List   Diagnosis Date Noted   Diabetic neuropathy ([place]) 06/03/2022   PAD (peripheral artery disease) ([place]) 11/30/2020   Carotid stenosis 06/01/2020   Carotid stenosis, asymptomatic 06/01/2020   Syncope 01/12/2020   Coagulation disorder ([place]) 05/25/2019   Coronary artery disease involving coronary bypass graft of native heart with angina pectoris ([name])    PAF (paroxysmal atrial fibrillation) ([name])    Type 2 diabetes mellitus with vascular disease ([place]) 05/30/2015   OSA on CPAP 05/30/2015   Abnormal stress test 05/18/2015   Diverticulosis of colon without hemorrhage 05/17/2015   Right bundle branch block 04/30/2015   Essential hypertension 04/29/2015   Hyperlipidemia 04/29/2015   DM type 2, controlled, with complication ([name]) 69/62/9528   Peripheral vascular disease ([place]) 06/08/2012   Stress fracture of metatarsal bone 02/18/2012   Acquired equinus deformity of foot  12/15/2011   Plantar fasciitis 12/15/2011   Stenosis of right internal carotid artery with cerebral infarction ([place]) 12/09/2011    PCP: Lona Kettle MD   REFERRING PROVIDER: Walker Shadow MD  REFERRING DIAG: [id number] (ICD-10-CM) - Other spondylosis with myelopathy, cervical region  THERAPY DIAG:  Cervicalgia  Other abnormalities of gait and mobility  Rationale for Evaluation and Treatment Rehabilitation  ONSET DATE:   SUBJECTIVE:  SUBJECTIVE STATEMENT: The patient has had a decline in balance over the past month. Catha Gosselin has had 2 major falls which have led to an increase in low back pain and neck pain. He has a history of low back and neck pain but they have increased recently.   PERTINENT HISTORY:  Anemia, acquired equinus deformity; CAD< CKD, DMII, IBS, MI, MI, neuralgia, PF, carotid stenosis Stroke, Torn Rotator cuff; CABG; vein bypass graft 1998; Spine surgery in 2014     PAIN:  Are you having pain? Yes: NPRS scale: 3/10 Pain location: right side of the neck and head  Pain description: aching  Aggravating factors: just comes on  Relieving factors: goes away with time   PRECAUTIONS: Fall  WEIGHT BEARING RESTRICTIONS: No  FALLS:  Has patient fallen in last 6 months? Yes. Number of falls 2 falls both reaching forward    LIVING ENVIRONMENT: Lives with wife; some steps into the house and stairs inside OCCUPATION: retried   Goes to a gym in [place]:   To have improved balance and to have less neck pain    OBJECTIVE:   DIAGNOSTIC FINDINGS:  Cervical MRI  MRI cervical spine (without) demonstrating: - At C3-4 disc bulging and uncovertebral joint hypertrophy with moderate spinal stenosis and severe bilateral foraminal stenosis;  subtle myelomalacia noted within the spinal cord on the left side. - At C4-5 disc bulging and uncovertebral joint hypertrophy with moderate to severe spinal stenosis and moderate bilateral foraminal stenosis. - At C5-6 disc bulging and facet hypertrophy with moderate spinal stenosis and severe bilateral foraminal stenosis.  Lumbar MRI: 9/10 MRI lumbar spine without contrast demonstrating: - At L5-S1: Anterior spondylolisthesis of L5 on S1 resulting in severe bilateral foraminal stenosis; posterior lumbar interbody fusion and metal hardware as above. - At L2-3: Disc bulging with mild spinal stenosis and mild bilateral foraminal stenosis. - At L4-5: Disc bulging with mild bilateral foraminal stenosis.  PATIENT SURVEYS:  FOTO    COGNITION: Overall cognitive status: Within functional limits for tasks assessed  SENSATION:   POSTURE: rounded shoulders and forward head  PALPATION: Spasming of the upper trap    CERVICAL ROM:   Active ROM A/PROM (deg) eval  Flexion   Extension   Right lateral flexion   Left lateral flexion   Right rotation 45 if he dips his neck he can get 65   Left rotation 45   (Blank rows = not tested)  UPPER EXTREMITY   MMT Right eval Left eval  Shoulder flexion 4/5 4/5  Shoulder extension    Shoulder abduction    Shoulder adduction    Shoulder extension    Shoulder internal rotation 4/5 4/5  Shoulder external rotation 4/5 4/5  Elbow flexion    Elbow extension    Wrist flexion    Wrist extension    Wrist ulnar deviation    Wrist radial deviation    Wrist pronation    Wrist supination     (Blank rows = not tested)  Grip right 40 lbs  Grip left 50 lbs    UPPER EXTREMITY MMT:  AROM  Right eval Left eval  Shoulder flexion 140 140  Shoulder extension    Shoulder abduction    Shoulder adduction    Shoulder extension    Shoulder internal rotation    Shoulder external rotation No pain but stiff  Pain in the left arm   Middle trapezius     Lower trapezius  Elbow flexion    Elbow extension    Wrist flexion    Wrist extension    Wrist ulnar deviation    Wrist radial deviation    Wrist pronation    Wrist supination    Grip strength     (Blank rows = not tested)  LOWER EXTREMITY MMT:    MMT Right eval Left eval  Hip flexion 25.9 25.8  Hip extension    Hip abduction 36.9 36.4  Hip adduction    Hip internal rotation    Hip external rotation    Knee flexion    Knee extension 40 37.5  Ankle dorsiflexion    Ankle plantarflexion    Ankle inversion    Ankle eversion     (Blank rows = not tested)     TODAY'S TREATMENT:                                                                                                                      DATE:  11/22 Manual: trigger point release to upper traps and cerivcal paraspinals; gentle manual traction  On air-ex Balance exercises Narrow base of support eyes open 2 x 30 seconds Narrow base of support eyes closed 2 x 30 seconds Tandem stance each leg 2 x 30 seconds more difficulty noted with right leg back  Step onto air-ex x20 fwd and side to side   Row 2 x 15 green Shoulder extension 2 x 15 green  11/16 Manual: trigger point release to upper traps and cerivcal paraspinals; gentle manual traction  Upper trap stretch 3 x 20 seconds bilateral Levator stretch 3 x 20 seconds bilateral Mild cueing for each for technique   Seated bilateral external rotation red band 2x15 Standing row red band 2x15 Standing extension red band 2x15  Balance exercises Narrow base of support eyes open 2 x 30 seconds Narrow base of support eyes closed 2 x 30 seconds Tandem stance each leg 2 x 30 seconds more difficulty noted with right leg back Heel toe rock with upper extremity support in the sink x 20  Initial eval  Access Code: [email or web address] URL: [email or web address] Date: 06/04/2022 Prepared by: Carolyne Littles  Exercises - Shoulder extension with resistance -  Neutral  - 1 x daily - 7 x weekly - 3 sets - 10 reps - Scapular Retraction with Resistance  - 1 x daily - 7 x weekly - 3 sets - 10 reps - Theracane Over Shoulder  - 1 x daily - 7 x weekly - 3 sets - 10 reps  PATIENT EDUCATION:  Education details: HEP and symptom management; improving balance   Person educated: Patient Education method: Explanation, Demonstration, Tactile cues, Verbal cues, and Handouts Education comprehension: verbalized understanding, returned demonstration, verbal cues required, tactile cues required, and needs further education  HOME EXERCISE PROGRAM: Access Code: [email or web address] URL: [email or web address] Date: 06/04/2022 Prepared by: Carolyne Littles  ASSESSMENT:  CLINICAL IMPRESSION: Patient is progressing well.  He has decreased tenderness to palpation in his left upper trap.  He still has a trigger point in his left upper trap but it is improving.  He continues to show full cervical rotation without increased pain.  He feels like his balance may be somewhat improved already.  We reviewed balance exercises for home today.  He was given heel toe rock for home.  We advance his band for Scap retraction and shoulder extension to green.  He was given a new  band for home.  Therapy will continue to advance balance exercises as tolerated  OBJECTIVE IMPAIRMENTS: Abnormal gait, decreased activity tolerance, decreased endurance, decreased mobility, difficulty walking, decreased ROM, decreased strength, increased fascial restrictions, increased muscle spasms, impaired UE functional use, and pain.   ACTIVITY LIMITATIONS: carrying, lifting, bending, standing, squatting, stairs, transfers, and locomotion level  PARTICIPATION LIMITATIONS: cleaning, laundry, shopping, community activity, and yard work  PERSONAL FACTORS: 3+ comorbidities: past CVA, CABG, arthritis in the back   are also affecting patient's functional outcome.   REHAB POTENTIAL: Good  CLINICAL DECISION  MAKING: Evolving/moderate complexity fluctuating pain levels in his neck and declining balance   EVALUATION COMPLEXITY: Moderate   GOALS: Goals reviewed with patient? Yes  SHORT TERM GOALS: Target date: 07/25/2022   Patient will increase cervical rotation by 10 degrees  Baseline:  Goal status: INITIAL  2.  Patient will report a 50% reduction in cervical headache  Baseline:  Goal status: INITIAL  3.  Patient will required CGA for narrow base with eyes open.  Baseline:  Goal status: INITIAL   LONG TERM GOALS: Target date: 08/08/2022  Patient will demonstrate improved balance with all balance tasks to improve safety  Baseline:  Goal status: INITIAL  2.  Patient will demonstrate >65 degrees of cervical rotation without pain in order to improve community safety  Baseline:  Goal status: INITIAL  3.  Patient will be independent with a complete HEP  Baseline:  Goal status: INITIAL   PLAN:  PT FREQUENCY: 2x/week  PT DURATION: 8 weeks  PLANNED INTERVENTIONS: Therapeutic exercises, Therapeutic activity, Neuromuscular re-education, Balance training, Gait training, Patient/Family education, Self Care, Joint mobilization, DME instructions, Aquatic Therapy, Dry Needling, Electrical stimulation, Cryotherapy, Moist heat, Taping, Ultrasound, and Manual therapy  PLAN FOR NEXT SESSION: see MRI of th neck. Keep all movements gentle and without forcing movement. Work on balance exercises. Give patient a general LE strengthening exercise.    Carney Living, PT 06/27/2022, 12:34 PM

## 2022-07-09 ENCOUNTER — Ambulatory Visit (HOSPITAL_BASED_OUTPATIENT_CLINIC_OR_DEPARTMENT_OTHER): Payer: PPO | Admitting: Physical Therapy

## 2022-07-09 DIAGNOSIS — M542 Cervicalgia: Secondary | ICD-10-CM | POA: Diagnosis not present

## 2022-07-09 DIAGNOSIS — R2689 Other abnormalities of gait and mobility: Secondary | ICD-10-CM

## 2022-07-09 NOTE — Therapy (Unsigned)
OUTPATIENT PHYSICAL THERAPY CERVICAL Treatment    Patient Name: Bradley [name] MRN: [id number] DOB:07/09/1946, 76 y.o., male Today's Date: 06/27/2022   PT End of Session - 06/26/22 1521     Visit Number 2    Number of Visits 12    Date for PT Re-Evaluation 07/16/22    Authorization Type HTA 15 dollars    PT Start Time 1430    PT Stop Time 1512    PT Time Calculation (min) 42 min    Activity Tolerance Patient tolerated treatment well    Behavior During Therapy [facility] for tasks assessed/performed              Past Medical History:  Diagnosis Date   Acquired equinus deformity of foot 12/15/2011   Anemia    Arthritis    Carotid artery occlusion    a. Carotid US 10/16: RICA 60-79%, L CEA patent with 1-39% stenosis   CKD (chronic kidney disease)    stage 3-4   Coagulation disorder ([place]) 05/25/2019   Coronary artery disease    a. Myoview 9/16:  EF 52%, inferior fixed defect consistent with diaphragmatic attenuation, intermediate risk secondary to poor exercise tolerance and symptoms during stress;  b. LHC 05/17/2015 90% mid LAD, 80% ost D2, 75% mid RCA, 35% prox RCA. >> S/p CABG   Coronary artery disease involving coronary bypass graft of native heart with angina pectoris ([place])    CORONARY ARTERY BYPASS GRAFTING x 3 - 05/23/2015 Left internal mammary artery to left anterior descending  Saphenous vein graft to diagonal  Saphenous vein graft to posterior descending Endoscopic greater saphenous vein harvest right thigh     Diabetes mellitus age 83   Diverticulitis    Diverticulosis of colon without hemorrhage 05/17/2015   By CT scan    DM type 2, controlled, with complication ([place]) 37/05/6268   Essential hypertension 04/29/2015   GERD (gastroesophageal reflux disease)    H/O hiatal hernia    History of echocardiogram    a. Echo 9/16: GLS -15.2%, EF 48-54%, grade 1 diastolic dysfunction, normal wall motion, aortic sclerosis, dilated aortic root 39 mm, atrial septal lipomatous  hypertrophy   Hyperlipidemia    Hypertension    Irritable bowel syndrome (IBS)    Myocardial infarction ([place])    silent inferior MI; patient denies MI history (03/17/13)    Neuralgia    Neuropathy 2013   OSA on CPAP 05/30/2015   Osteoporosis 2013   PAF (paroxysmal atrial fibrillation) ([place])    post CABG 2016; Amiodarone stopped 2/2 wheezing; PAF 01/12/20   Peripheral vascular disease ([place])    Plantar fasciitis 12/15/2011   Reflux esophagitis    Right bundle branch block 04/30/2015   Sleep apnea    uses CPAP   Stenosis of right internal carotid artery with cerebral infarction ([place]) 12/09/2011   Stress fracture of metatarsal bone 02/18/2012   Stroke ([place]) 1987   Right brain stroke- slight drop on left side   Syncope 01/12/2020   Type 2 diabetes mellitus with vascular disease ([place]) 05/30/2015   Wears glasses    Past Surgical History:  Procedure Laterality Date   ABDOMINAL AORTOGRAM W/LOWER EXTREMITY Bilateral 11/27/2020   Procedure: ABDOMINAL AORTOGRAM W/LOWER EXTREMITY;  Surgeon: Serafina Mitchell, MD;  Location: [facility];  Service: Cardiovascular;  Laterality: Bilateral;   CARDIAC CATHETERIZATION N/A 05/17/2015   Procedure: Left Heart Cath and Coronary Angiography;  Surgeon: Belva Crome, MD;  Location: [facility];  Service: Cardiovascular;  Laterality: N/A;   CAROTID ENDARTERECTOMY  1994 & redo 2001   Left   COLONOSCOPY W/ BIOPSIES AND POLYPECTOMY     CORONARY ARTERY BYPASS GRAFT N/A 05/23/2015   Procedure: CORONARY ARTERY BYPASS GRAFTING (CABG) x 3 (LIMA to LAD, SVG to DIAGONAL 2, SVG to PDA) with Endoscopic Vein Harvesting from right greater saphenous vein;  Surgeon: Grace Isaac, MD;  Location: [facility];  Service: Open Heart Surgery;  Laterality: N/A;   ENDARTERECTOMY FEMORAL Right 11/30/2020   Procedure: ENDARTERECTOMY FEMORAL RIGHT;  Surgeon: Rosetta Posner, MD;  Location: [facility];  Service: Vascular;  Laterality: Right;   FEMORAL-POPLITEAL BYPASS GRAFT Right  11/30/2020   Procedure: BYPASS GRAFT FEMORAL-POPLITEAL ARTERY RIGHT;  Surgeon: Rosetta Posner, MD;  Location: [facility];  Service: Vascular;  Laterality: Right;   FRACTURE SURGERY  2013   Right   foo  t X's 2   ILIAC ARTERY STENT     LAPAROSCOPIC CHOLECYSTECTOMY  09/11/2016   POSTERIOR LUMBAR FUSION  Aug. 14, 2014   Level 1   [place] SURGERY  2014   TEE WITHOUT CARDIOVERSION N/A 05/23/2015   Procedure: TRANSESOPHAGEAL ECHOCARDIOGRAM (TEE);  Surgeon: Grace Isaac, MD;  Location: [facility];  Service: Open Heart Surgery;  Laterality: N/A;   TONSILLECTOMY     TRANSCAROTID ARTERY REVASCULARIZATION  Right 06/01/2020   Procedure: RIGHT TRANSCAROTID ARTERY REVASCULARIZATION;  Surgeon: Serafina Mitchell, MD;  Location: [facility];  Service: Vascular;  Laterality: Right;   TRIGGER FINGER RELEASE Right 07/14/2017   Procedure: RIGHT LONG FINGER TRIGGER RELEASE;  Surgeon: Milly Jakob, MD;  Location: [facility];  Service: Orthopedics;  Laterality: Right;   Patient Active Problem List   Diagnosis Date Noted   Diabetic neuropathy ([place]) 06/03/2022   PAD (peripheral artery disease) ([place]) 11/30/2020   Carotid stenosis 06/01/2020   Carotid stenosis, asymptomatic 06/01/2020   Syncope 01/12/2020   Coagulation disorder ([place]) 05/25/2019   Coronary artery disease involving coronary bypass graft of native heart with angina pectoris ([name])    PAF (paroxysmal atrial fibrillation) ([name])    Type 2 diabetes mellitus with vascular disease ([place]) 05/30/2015   OSA on CPAP 05/30/2015   Abnormal stress test 05/18/2015   Diverticulosis of colon without hemorrhage 05/17/2015   Right bundle branch block 04/30/2015   Essential hypertension 04/29/2015   Hyperlipidemia 04/29/2015   DM type 2, controlled, with complication ([place]) 78/93/8101   Peripheral vascular disease ([place]) 06/08/2012   Stress fracture of metatarsal bone 02/18/2012   Acquired equinus deformity of foot  12/15/2011   Plantar fasciitis 12/15/2011   Stenosis of right internal carotid artery with cerebral infarction ([place]) 12/09/2011    PCP: Lona Kettle MD   REFERRING PROVIDER: Walker Shadow MD  REFERRING DIAG: [phone] (ICD-10-CM) - Other spondylosis with myelopathy, cervical region  THERAPY DIAG:  Cervicalgia  Other abnormalities of gait and mobility  Rationale for Evaluation and Treatment Rehabilitation  ONSET DATE:   SUBJECTIVE:  SUBJECTIVE STATEMENT: The patient has had a decline in balance over the past month. Catha Gosselin has had 2 major falls which have led to an increase in low back pain and neck pain. He has a history of low back and neck pain but they have increased recently.   PERTINENT HISTORY:  Anemia, acquired equinus deformity; CAD< CKD, DMII, IBS, MI, MI, neuralgia, PF, carotid stenosis Stroke, [place]orn Rotator cuff; CABG; vein bypass graft 1998; Spine surgery in 2014     PAIN:  Are you having pain? Yes: NPRS scale: 3/10 Pain location: right side of the neck and head  Pain description: aching  Aggravating factors: just comes on  Relieving factors: goes away with time   PRECAUTIONS: Fall  WEIGHT BEARING RESTRICTIONS: No  FALLS:  Has patient fallen in last 6 months? Yes. Number of falls 2 falls both reaching forward    LIVING ENVIRONMENT: Lives with wife; some steps into the house and stairs inside OCCUPATION: retried   Goes to a gym in [place]:   To have improved balance and to have less neck pain    OBJECTIVE:   DIAGNOSTIC FINDINGS:  Cervical MRI  MRI cervical spine (without) demonstrating: - At C3-4 disc bulging and uncovertebral joint hypertrophy with moderate spinal stenosis and severe bilateral foraminal stenosis;  subtle myelomalacia noted within the spinal cord on the left side. - At C4-5 disc bulging and uncovertebral joint hypertrophy with moderate to severe spinal stenosis and moderate bilateral foraminal stenosis. - At C5-6 disc bulging and facet hypertrophy with moderate spinal stenosis and severe bilateral foraminal stenosis.  Lumbar MRI: 9/10 MRI lumbar spine without contrast demonstrating: - At L5-S1: Anterior spondylolisthesis of L5 on S1 resulting in severe bilateral foraminal stenosis; posterior lumbar interbody fusion and metal hardware as above. - At L2-3: Disc bulging with mild spinal stenosis and mild bilateral foraminal stenosis. - At L4-5: Disc bulging with mild bilateral foraminal stenosis.  PATIENT SURVEYS:  FOTO    COGNITION: Overall cognitive status: Within functional limits for tasks assessed  SENSATION:   POSTURE: rounded shoulders and forward head  PALPATION: Spasming of the upper trap    CERVICAL ROM:   Active ROM A/PROM (deg) eval  Flexion   Extension   Right lateral flexion   Left lateral flexion   Right rotation 45 if he dips his neck he can get 65   Left rotation 45   (Blank rows = not tested)  UPPER EXTREMITY   MMT Right eval Left eval  Shoulder flexion 4/5 4/5  Shoulder extension    Shoulder abduction    Shoulder adduction    Shoulder extension    Shoulder internal rotation 4/5 4/5  Shoulder external rotation 4/5 4/5  Elbow flexion    Elbow extension    Wrist flexion    Wrist extension    Wrist ulnar deviation    Wrist radial deviation    Wrist pronation    Wrist supination     (Blank rows = not tested)  Grip right 40 lbs  Grip left 50 lbs    UPPER EXTREMITY MMT:  AROM  Right eval Left eval  Shoulder flexion 140 140  Shoulder extension    Shoulder abduction    Shoulder adduction    Shoulder extension    Shoulder internal rotation    Shoulder external rotation No pain but stiff  Pain in the left arm   Middle trapezius     Lower trapezius  Elbow flexion    Elbow extension    Wrist flexion    Wrist extension    Wrist ulnar deviation    Wrist radial deviation    Wrist pronation    Wrist supination    Grip strength     (Blank rows = not tested)  LOWER EXTREMITY MMT:    MMT Right eval Left eval  Hip flexion 25.9 25.8  Hip extension    Hip abduction 36.9 36.4  Hip adduction    Hip internal rotation    Hip external rotation    Knee flexion    Knee extension 40 37.5  Ankle dorsiflexion    Ankle plantarflexion    Ankle inversion    Ankle eversion     (Blank rows = not tested)     TODAY'S TREATMENT:                                                                                                                      DATE:  11/22 Manual: trigger point release to upper traps and cerivcal paraspinals; gentle manual traction  On air-ex Balance exercises Narrow base of support eyes open 2 x 30 seconds Narrow base of support eyes closed 2 x 30 seconds Tandem stance each leg 2 x 30 seconds more difficulty noted with right leg back  Step onto air-ex x20 fwd and side to side   Row 2 x 15 green Shoulder extension 2 x 15 green  11/16 Manual: trigger point release to upper traps and cerivcal paraspinals; gentle manual traction  Upper trap stretch 3 x 20 seconds bilateral Levator stretch 3 x 20 seconds bilateral Mild cueing for each for technique   Seated bilateral external rotation red band 2x15 Standing row red band 2x15 Standing extension red band 2x15  Balance exercises Narrow base of support eyes open 2 x 30 seconds Narrow base of support eyes closed 2 x 30 seconds Tandem stance each leg 2 x 30 seconds more difficulty noted with right leg back Heel toe rock with upper extremity support in the sink x 20  Initial eval  Access Code: [email or web address] URL: [email or web address] Date: 06/04/2022 Prepared by: Carolyne Littles  Exercises - Shoulder extension with resistance -  Neutral  - 1 x daily - 7 x weekly - 3 sets - 10 reps - Scapular Retraction with Resistance  - 1 x daily - 7 x weekly - 3 sets - 10 reps - Theracane Over Shoulder  - 1 x daily - 7 x weekly - 3 sets - 10 reps  PATIENT EDUCATION:  Education details: HEP and symptom management; improving balance   Person educated: Patient Education method: Explanation, Demonstration, Tactile cues, Verbal cues, and Handouts Education comprehension: verbalized understanding, returned demonstration, verbal cues required, tactile cues required, and needs further education  HOME EXERCISE PROGRAM: Access Code: [email or web address] URL: [email or web address] Date: 06/04/2022 Prepared by: Carolyne Littles  ASSESSMENT:  CLINICAL IMPRESSION: Patient is progressing well.  He has decreased tenderness to palpation in his left upper trap.  He still has a trigger point in his left upper trap but it is improving.  He continues to show full cervical rotation without increased pain.  He feels like his balance may be somewhat improved already.  We reviewed balance exercises for home today.  He was given heel toe rock for home.  We advance his band for Scap retraction and shoulder extension to green.  He was given a new  band for home.  Therapy will continue to advance balance exercises as tolerated  OBJECTIVE IMPAIRMENTS: Abnormal gait, decreased activity tolerance, decreased endurance, decreased mobility, difficulty walking, decreased ROM, decreased strength, increased fascial restrictions, increased muscle spasms, impaired UE functional use, and pain.   ACTIVITY LIMITATIONS: carrying, lifting, bending, standing, squatting, stairs, transfers, and locomotion level  PARTICIPATION LIMITATIONS: cleaning, laundry, shopping, community activity, and yard work  PERSONAL FACTORS: 3+ comorbidities: past CVA, CABG, arthritis in the back   are also affecting patient's functional outcome.   REHAB POTENTIAL: Good  CLINICAL DECISION  MAKING: Evolving/moderate complexity fluctuating pain levels in his neck and declining balance   EVALUATION COMPLEXITY: Moderate   GOALS: Goals reviewed with patient? Yes  SHORT TERM GOALS: Target date: 07/25/2022   Patient will increase cervical rotation by 10 degrees  Baseline:  Goal status: INITIAL  2.  Patient will report a 50% reduction in cervical headache  Baseline:  Goal status: INITIAL  3.  Patient will required CGA for narrow base with eyes open.  Baseline:  Goal status: INITIAL   LONG TERM GOALS: Target date: 08/08/2022  Patient will demonstrate improved balance with all balance tasks to improve safety  Baseline:  Goal status: INITIAL  2.  Patient will demonstrate >65 degrees of cervical rotation without pain in order to improve community safety  Baseline:  Goal status: INITIAL  3.  Patient will be independent with a complete HEP  Baseline:  Goal status: INITIAL   PLAN:  PT FREQUENCY: 2x/week  PT DURATION: 8 weeks  PLANNED INTERVENTIONS: Therapeutic exercises, Therapeutic activity, Neuromuscular re-education, Balance training, Gait training, Patient/Family education, Self Care, Joint mobilization, DME instructions, Aquatic Therapy, Dry Needling, Electrical stimulation, Cryotherapy, Moist heat, Taping, Ultrasound, and Manual therapy  PLAN FOR NEXT SESSION: see MRI of th neck. Keep all movements gentle and without forcing movement. Work on balance exercises. Give patient a general LE strengthening exercise.    Carney Living, PT 06/27/2022, 12:34 PM

## 2022-07-10 ENCOUNTER — Encounter (HOSPITAL_BASED_OUTPATIENT_CLINIC_OR_DEPARTMENT_OTHER): Payer: Self-pay | Admitting: Physical Therapy

## 2022-07-14 DIAGNOSIS — E1142 Type 2 diabetes mellitus with diabetic polyneuropathy: Secondary | ICD-10-CM | POA: Diagnosis not present

## 2022-07-15 DIAGNOSIS — M5412 Radiculopathy, cervical region: Secondary | ICD-10-CM | POA: Diagnosis not present

## 2022-07-15 DIAGNOSIS — Z87898 Personal history of other specified conditions: Secondary | ICD-10-CM | POA: Diagnosis not present

## 2022-07-15 DIAGNOSIS — I129 Hypertensive chronic kidney disease with stage 1 through stage 4 chronic kidney disease, or unspecified chronic kidney disease: Secondary | ICD-10-CM | POA: Diagnosis not present

## 2022-07-15 DIAGNOSIS — N184 Chronic kidney disease, stage 4 (severe): Secondary | ICD-10-CM | POA: Diagnosis not present

## 2022-07-15 DIAGNOSIS — M9901 Segmental and somatic dysfunction of cervical region: Secondary | ICD-10-CM | POA: Diagnosis not present

## 2022-07-15 DIAGNOSIS — E1122 Type 2 diabetes mellitus with diabetic chronic kidney disease: Secondary | ICD-10-CM | POA: Diagnosis not present

## 2022-07-15 DIAGNOSIS — I2510 Atherosclerotic heart disease of native coronary artery without angina pectoris: Secondary | ICD-10-CM | POA: Diagnosis not present

## 2022-07-15 DIAGNOSIS — N179 Acute kidney failure, unspecified: Secondary | ICD-10-CM | POA: Diagnosis not present

## 2022-07-15 DIAGNOSIS — R39198 Other difficulties with micturition: Secondary | ICD-10-CM | POA: Diagnosis not present

## 2022-07-15 DIAGNOSIS — M5033 Other cervical disc degeneration, cervicothoracic region: Secondary | ICD-10-CM | POA: Diagnosis not present

## 2022-07-15 DIAGNOSIS — E785 Hyperlipidemia, unspecified: Secondary | ICD-10-CM | POA: Diagnosis not present

## 2022-07-15 DIAGNOSIS — I639 Cerebral infarction, unspecified: Secondary | ICD-10-CM | POA: Diagnosis not present

## 2022-07-15 DIAGNOSIS — M9903 Segmental and somatic dysfunction of lumbar region: Secondary | ICD-10-CM | POA: Diagnosis not present

## 2022-07-15 DIAGNOSIS — R809 Proteinuria, unspecified: Secondary | ICD-10-CM | POA: Diagnosis not present

## 2022-07-15 DIAGNOSIS — E871 Hypo-osmolality and hyponatremia: Secondary | ICD-10-CM | POA: Diagnosis not present

## 2022-07-16 ENCOUNTER — Ambulatory Visit (HOSPITAL_BASED_OUTPATIENT_CLINIC_OR_DEPARTMENT_OTHER): Payer: PPO | Admitting: Physical Therapy

## 2022-07-21 ENCOUNTER — Encounter (HOSPITAL_BASED_OUTPATIENT_CLINIC_OR_DEPARTMENT_OTHER): Payer: Self-pay | Admitting: Physical Therapy

## 2022-07-21 ENCOUNTER — Ambulatory Visit (HOSPITAL_BASED_OUTPATIENT_CLINIC_OR_DEPARTMENT_OTHER): Payer: PPO | Attending: Neurosurgery | Admitting: Physical Therapy

## 2022-07-21 DIAGNOSIS — M542 Cervicalgia: Secondary | ICD-10-CM | POA: Diagnosis not present

## 2022-07-21 DIAGNOSIS — R2689 Other abnormalities of gait and mobility: Secondary | ICD-10-CM | POA: Insufficient documentation

## 2022-07-21 NOTE — Therapy (Addendum)
OUTPATIENT PHYSICAL THERAPY CERVICAL Treatment   Progress Note Reporting Period 06/03/2022 to 07/21/2022  See note below for Objective Data and Assessment of Progress/Goals.       Patient Name: Bradley [name] MRN: [id number] DOB:12-01-1945, 76 y.o., male Today's Date: 06/27/2022   PT End of Session - 06/26/22 1521     Visit Number 2    Number of Visits 12    Date for PT Re-Evaluation 07/16/22    Authorization Type HTA 15 dollars    PT Start Time 1430    PT Stop Time 1512    PT Time Calculation (min) 42 min    Activity Tolerance Patient tolerated treatment well    Behavior During Therapy [facility] for tasks assessed/performed              Past Medical History:  Diagnosis Date   Acquired equinus deformity of foot 12/15/2011   Anemia    Arthritis    Carotid artery occlusion    a. Carotid US 10/16: RICA 60-79%, L CEA patent with 1-39% stenosis   CKD (chronic kidney disease)    stage 3-4   Coagulation disorder ([place]) 05/25/2019   Coronary artery disease    a. Myoview 9/16:  EF 52%, inferior fixed defect consistent with diaphragmatic attenuation, intermediate risk secondary to poor exercise tolerance and symptoms during stress;  b. LHC 05/17/2015 90% mid LAD, 80% ost D2, 75% mid RCA, 35% prox RCA. >> S/p CABG   Coronary artery disease involving coronary bypass graft of native heart with angina pectoris ([place])    CORONARY ARTERY BYPASS GRAFTING x 3 - 05/23/2015 Left internal mammary artery to left anterior descending  Saphenous vein graft to diagonal  Saphenous vein graft to posterior descending Endoscopic greater saphenous vein harvest right thigh     Diabetes mellitus age 72   Diverticulitis    Diverticulosis of colon without hemorrhage 05/17/2015   By CT scan    DM type 2, controlled, with complication ([place]) [id number]   Essential hypertension 04/29/2015   GERD (gastroesophageal reflux disease)    H/O hiatal hernia    History of echocardiogram    a. Echo 9/16: GLS -15.2%,  EF 0000000, grade 1 diastolic dysfunction, normal wall motion, aortic sclerosis, dilated aortic root 39 mm, atrial septal lipomatous hypertrophy   Hyperlipidemia    Hypertension    Irritable bowel syndrome (IBS)    Myocardial infarction ([place])    silent inferior MI; patient denies MI history (03/17/13)    Neuralgia    Neuropathy 2013   OSA on CPAP 05/30/2015   Osteoporosis 2013   PAF (paroxysmal atrial fibrillation) ([place])    post CABG 2016; Amiodarone stopped 2/2 wheezing; PAF 01/12/20   Peripheral vascular disease ([place])    Plantar fasciitis 12/15/2011   Reflux esophagitis    Right bundle branch block 04/30/2015   Sleep apnea    uses CPAP   Stenosis of right internal carotid artery with cerebral infarction ([place]) 12/09/2011   Stress fracture of metatarsal bone 02/18/2012   Stroke ([place]) 1987   Right brain stroke- slight drop on left side   Syncope 01/12/2020   Type 2 diabetes mellitus with vascular disease ([place]) 05/30/2015   Wears glasses    Past Surgical History:  Procedure Laterality Date   ABDOMINAL AORTOGRAM W/LOWER EXTREMITY Bilateral 11/27/2020   Procedure: ABDOMINAL AORTOGRAM W/LOWER EXTREMITY;  Surgeon: Serafina Mitchell, MD;  Location: [facility];  Service: Cardiovascular;  Laterality: Bilateral;   CARDIAC CATHETERIZATION N/A 05/17/2015  Procedure: Left Heart Cath and Coronary Angiography;  Surgeon: Belva Crome, MD;  Location: [facility];  Service: Cardiovascular;  Laterality: N/A;   CAROTID ENDARTERECTOMY  1994 & redo 2001   Left   COLONOSCOPY W/ BIOPSIES AND POLYPECTOMY     CORONARY ARTERY BYPASS GRAFT N/A 05/23/2015   Procedure: CORONARY ARTERY BYPASS GRAFTING (CABG) x 3 (LIMA to LAD, SVG to DIAGONAL 2, SVG to PDA) with Endoscopic Vein Harvesting from right greater saphenous vein;  Surgeon: Grace Isaac, MD;  Location: [facility];  Service: Open Heart Surgery;  Laterality: N/A;   ENDARTERECTOMY FEMORAL Right 11/30/2020   Procedure: ENDARTERECTOMY FEMORAL  RIGHT;  Surgeon: Rosetta Posner, MD;  Location: [facility];  Service: Vascular;  Laterality: Right;   FEMORAL-POPLITEAL BYPASS GRAFT Right 11/30/2020   Procedure: BYPASS GRAFT FEMORAL-POPLITEAL ARTERY RIGHT;  Surgeon: Rosetta Posner, MD;  Location: [facility];  Service: Vascular;  Laterality: Right;   FRACTURE SURGERY  2013   Right   foo  t X's 2   ILIAC ARTERY STENT     LAPAROSCOPIC CHOLECYSTECTOMY  09/11/2016   POSTERIOR LUMBAR FUSION  Aug. 14, 2014   Level 1   [place] SURGERY  2014   TEE WITHOUT CARDIOVERSION N/A 05/23/2015   Procedure: TRANSESOPHAGEAL ECHOCARDIOGRAM (TEE);  Surgeon: Grace Isaac, MD;  Location: [facility];  Service: Open Heart Surgery;  Laterality: N/A;   TONSILLECTOMY     TRANSCAROTID ARTERY REVASCULARIZATION  Right 06/01/2020   Procedure: RIGHT TRANSCAROTID ARTERY REVASCULARIZATION;  Surgeon: Serafina Mitchell, MD;  Location: [facility];  Service: Vascular;  Laterality: Right;   TRIGGER FINGER RELEASE Right 07/14/2017   Procedure: RIGHT LONG FINGER TRIGGER RELEASE;  Surgeon: Milly Jakob, MD;  Location: [facility];  Service: Orthopedics;  Laterality: Right;   Patient Active Problem List   Diagnosis Date Noted   Diabetic neuropathy ([place]) 06/03/2022   PAD (peripheral artery disease) ([place]) 11/30/2020   Carotid stenosis 06/01/2020   Carotid stenosis, asymptomatic 06/01/2020   Syncope 01/12/2020   Coagulation disorder ([place]) 05/25/2019   Coronary artery disease involving coronary bypass graft of native heart with angina pectoris ([name])    PAF (paroxysmal atrial fibrillation) ([name])    Type 2 diabetes mellitus with vascular disease ([place]) 05/30/2015   OSA on CPAP 05/30/2015   Abnormal stress test 05/18/2015   Diverticulosis of colon without hemorrhage 05/17/2015   Right bundle branch block 04/30/2015   Essential hypertension 04/29/2015   Hyperlipidemia 04/29/2015   DM type 2, controlled, with complication ([place]) [id number]    Peripheral vascular disease ([place]) 06/08/2012   Stress fracture of metatarsal bone 02/18/2012   Acquired equinus deformity of foot 12/15/2011   Plantar fasciitis 12/15/2011   Stenosis of right internal carotid artery with cerebral infarction ([place]) 12/09/2011    PCP: Lona Kettle MD   REFERRING PROVIDER: Walker Shadow MD  REFERRING DIAG: [phone] (ICD-10-CM) - Other spondylosis with myelopathy, cervical region  THERAPY DIAG:  Cervicalgia  Other abnormalities of gait and mobility  Rationale for Evaluation and Treatment Rehabilitation  ONSET DATE:   SUBJECTIVE:  SUBJECTIVE STATEMENT: The patient had a fall today. He feels like he was just rushing too much. His right knee is sore and he feels like he jarred his neck. He was othersie doing fairly well.   PERTINENT HISTORY:  Anemia, acquired equinus deformity; CAD< CKD, DMII, IBS, MI, MI, neuralgia, PF, carotid stenosis Stroke, Torn Rotator cuff; CABG; vein bypass graft 1998; Spine surgery in 2014     PAIN:  Are you having pain? Yes: NPRS scale: 3/10 Pain location: right side of the neck and head  Pain description: aching  Aggravating factors: just comes on  Relieving factors: goes away with time   PRECAUTIONS: Fall  WEIGHT BEARING RESTRICTIONS: No  FALLS:  Has patient fallen in last 6 months? Yes. Number of falls 2 falls both reaching forward    LIVING ENVIRONMENT: Lives with wife; some steps into the house and stairs inside OCCUPATION: retried   Goes to a gym in [place]:   To have improved balance and to have less neck pain    OBJECTIVE:   DIAGNOSTIC FINDINGS:  Cervical MRI  MRI cervical spine (without) demonstrating: - At C3-4 disc bulging and uncovertebral joint  hypertrophy with moderate spinal stenosis and severe bilateral foraminal stenosis; subtle myelomalacia noted within the spinal cord on the left side. - At C4-5 disc bulging and uncovertebral joint hypertrophy with moderate to severe spinal stenosis and moderate bilateral foraminal stenosis. - At C5-6 disc bulging and facet hypertrophy with moderate spinal stenosis and severe bilateral foraminal stenosis.  Lumbar MRI: 9/10 MRI lumbar spine without contrast demonstrating: - At L5-S1: Anterior spondylolisthesis of L5 on S1 resulting in severe bilateral foraminal stenosis; posterior lumbar interbody fusion and metal hardware as above. - At L2-3: Disc bulging with mild spinal stenosis and mild bilateral foraminal stenosis. - At L4-5: Disc bulging with mild bilateral foraminal stenosis.  PATIENT SURVEYS:  FOTO    COGNITION: Overall cognitive status: Within functional limits for tasks assessed  SENSATION:   POSTURE: rounded shoulders and forward head  PALPATION: Spasming of the upper trap    CERVICAL ROM:   Active ROM A/PROM (deg) eval 12/11  Flexion    Extension  23  Right lateral flexion    Left lateral flexion    Right rotation 45 if he dips his neck he can get 65  55  Left rotation 45 60   (Blank rows = not tested)  UPPER EXTREMITY   MMT Right eval Left eval Right 12/11  Left 12/11  Shoulder flexion 4/5 4/5 4+/5 5/5  Shoulder extension      Shoulder abduction      Shoulder adduction      Shoulder extension      Shoulder internal rotation 4/5 4/5 5/5 5/5  Shoulder external rotation 4/5 4/5 4+/5 4+/5  Elbow flexion      Elbow extension      Wrist flexion      Wrist extension      Wrist ulnar deviation      Wrist radial deviation      Wrist pronation      Wrist supination       (Blank rows = not tested)  Grip right 40 lbs  Grip left 50 lbs    UPPER EXTREMITY MMT:  AROM  Right eval Left eval Right  left  Shoulder flexion 140 140 160  160   Shoulder  extension      Shoulder abduction  Shoulder adduction      Shoulder extension      Shoulder internal rotation      Shoulder external rotation No pain but stiff  Pain in the left arm   No pain reaching behind his head   Middle trapezius      Lower trapezius      Elbow flexion      Elbow extension      Wrist flexion      Wrist extension      Wrist ulnar deviation      Wrist radial deviation      Wrist pronation      Wrist supination      Grip strength       (Blank rows = not tested)  LOWER EXTREMITY MMT:    MMT Right eval Left eval Right  12/11 Left  12/11  Hip flexion 25.9 25.8 34.4 34.9  Hip extension      Hip abduction 36.9 36.4 36.5 39.0  Hip adduction      Hip internal rotation      Hip external rotation      Knee flexion      Knee extension 40 37.5    Ankle dorsiflexion      Ankle plantarflexion      Ankle inversion      Ankle eversion       (Blank rows = not tested)     TODAY'S TREATMENT:                                                                                                                      DATE:  12/11 Manual: trigger point release to upper traps and cerivcal paraspinals; gentle manual traction  Performed re-assessment on the patient. Reviewed test and measures and goals including strength testing, FOTO, and ROM    Seated bilateral external rotation red band 2x15 Standing row red band 2x15 Standing extension red band 2x15   Row 2 x 15 red Shoulder extension 2 x 15 red     11/22 Manual: trigger point release to upper traps and cerivcal paraspinals; gentle manual traction  Row 2 x 15 green Shoulder extension 2 x 15 green  Balance exercises Narrow base of support eyes open 2 x 30 seconds Narrow base of support eyes closed 2 x 30 seconds  Step onto air-ex x20 fwd and side to side   11/22 Manual: trigger point release to upper traps and cerivcal paraspinals; gentle manual traction  On air-ex Balance exercises Narrow base  of support eyes open 2 x 30 seconds Narrow base of support eyes closed 2 x 30 seconds Tandem stance each leg 2 x 30 seconds more difficulty noted with right leg back  Step onto air-ex x20 fwd and side to side   Row 2 x 15 green Shoulder extension 2 x 15 green  11/16 Manual: trigger point release to upper traps and cerivcal paraspinals; gentle manual traction  Upper trap stretch 3 x 20 seconds bilateral Levator stretch  3 x 20 seconds bilateral Mild cueing for each for technique   Seated bilateral external rotation red band 2x15 Standing row red band 2x15 Standing extension red band 2x15  Balance exercises Narrow base of support eyes open 2 x 30 seconds Narrow base of support eyes closed 2 x 30 seconds Tandem stance each leg 2 x 30 seconds more difficulty noted with right leg back Heel toe rock with upper extremity support in the sink x 20  Initial eval  Access Code: [email or web address] URL: [email or web address] Date: 06/04/2022 Prepared by: Carolyne Littles  Exercises - Shoulder extension with resistance - Neutral  - 1 x daily - 7 x weekly - 3 sets - 10 reps - Scapular Retraction with Resistance  - 1 x daily - 7 x weekly - 3 sets - 10 reps - Theracane Over Shoulder  - 1 x daily - 7 x weekly - 3 sets - 10 reps  PATIENT EDUCATION:  Education details: HEP and symptom management; improving balance   Person educated: Patient Education method: Explanation, Demonstration, Tactile cues, Verbal cues, and Handouts Education comprehension: verbalized understanding, returned demonstration, verbal cues required, tactile cues required, and needs further education  HOME EXERCISE PROGRAM: Access Code: [email or web address] URL: [email or web address] Date: 06/04/2022 Prepared by: Carolyne Littles  ASSESSMENT:  CLINICAL IMPRESSION: The patient continues to have a trigger point in his right upper trap. He reports the neck feels better overall but he still has pain when he is sleeping.  He has been working on changing his position which has helped. His strength in his UE/LE have improved.  Therapy focused on manual therapy today as well as postural strengthening.  He tolerated well.  We also put him on the NuStep just get his knee moving and his upper extremities moving.  He had no increased pain with gentle motion on the NuStep.  He would benefit from further skilled therapy to reduce fall risk and to improve cervical motion.  He was mostly limited in cervical extension today.  See below for goal specific progress.  Patient advised if he continues to have pain in his knee and his neck from the fall to get checked out by MD. OBJECTIVE IMPAIRMENTS: Abnormal gait, decreased activity tolerance, decreased endurance, decreased mobility, difficulty walking, decreased ROM, decreased strength, increased fascial restrictions, increased muscle spasms, impaired UE functional use, and pain.   ACTIVITY LIMITATIONS: carrying, lifting, bending, standing, squatting, stairs, transfers, and locomotion level  PARTICIPATION LIMITATIONS: cleaning, laundry, shopping, community activity, and yard work  PERSONAL FACTORS: 3+ comorbidities: past CVA, CABG, arthritis in the back   are also affecting patient's functional outcome.   REHAB POTENTIAL: Good  CLINICAL DECISION MAKING: Evolving/moderate complexity fluctuating pain levels in his neck and declining balance   EVALUATION COMPLEXITY: Moderate   GOALS: Goals reviewed with patient? Yes  SHORT TERM GOALS: Target date: 07/25/2022   Patient will increase cervical rotation by 10 degrees  Baseline: Achieved on left not achieved on the right Goal status: INITIAL  2.  Patient will report a 50% reduction in cervical headache  Baseline:  Goal status: Achieved 12/11  3.  Patient will required CGA for narrow base with eyes open.  Baseline:  Goal status: Achieved 12/11  4.  Patient will increase cervical extension by 10 degrees without pain Goal  status: New goal 12/11  LONG TERM GOALS: Target date: 08/08/2022  Patient will demonstrate improved balance with all balance tasks to improve safety  Baseline:  Goal status: Improved balance but still high fall risk 12/11  2.  Patient will demonstrate >65 degrees of cervical rotation without pain in order to improve community safety  Baseline:  Goal status: 65 on left 12/11  3.  Patient will be independent with a complete HEP  Baseline:  Goal status: Independent with basic exercise program will continue to expand 12/11   PLAN:  PT FREQUENCY: 2x/week  PT DURATION: 8 weeks  PLANNED INTERVENTIONS: Therapeutic exercises, Therapeutic activity, Neuromuscular re-education, Balance training, Gait training, Patient/Family education, Self Care, Joint mobilization, DME instructions, Aquatic Therapy, Dry Needling, Electrical stimulation, Cryotherapy, Moist heat, Taping, Ultrasound, and Manual therapy  PLAN FOR NEXT SESSION: see MRI of the neck. Keep all movements gentle and without forcing movement. Work on balance exercises. Give patient a general LE strengthening exercise.  Continue with manual therapy for the neck, continue with functional strengthening continue with balance training   Carney Living, PT 06/27/2022, 12:34 PM

## 2022-07-22 ENCOUNTER — Encounter (HOSPITAL_BASED_OUTPATIENT_CLINIC_OR_DEPARTMENT_OTHER): Payer: Self-pay | Admitting: Physical Therapy

## 2022-07-29 DIAGNOSIS — M9903 Segmental and somatic dysfunction of lumbar region: Secondary | ICD-10-CM | POA: Diagnosis not present

## 2022-07-29 DIAGNOSIS — M5033 Other cervical disc degeneration, cervicothoracic region: Secondary | ICD-10-CM | POA: Diagnosis not present

## 2022-07-29 DIAGNOSIS — M9901 Segmental and somatic dysfunction of cervical region: Secondary | ICD-10-CM | POA: Diagnosis not present

## 2022-07-29 DIAGNOSIS — M5412 Radiculopathy, cervical region: Secondary | ICD-10-CM | POA: Diagnosis not present

## 2022-08-05 ENCOUNTER — Ambulatory Visit (HOSPITAL_BASED_OUTPATIENT_CLINIC_OR_DEPARTMENT_OTHER): Payer: PPO | Admitting: Physical Therapy

## 2022-08-05 ENCOUNTER — Encounter (HOSPITAL_BASED_OUTPATIENT_CLINIC_OR_DEPARTMENT_OTHER): Payer: Self-pay | Admitting: Physical Therapy

## 2022-08-05 DIAGNOSIS — R2689 Other abnormalities of gait and mobility: Secondary | ICD-10-CM

## 2022-08-05 DIAGNOSIS — M542 Cervicalgia: Secondary | ICD-10-CM | POA: Diagnosis not present

## 2022-08-05 NOTE — Therapy (Signed)
OUTPATIENT PHYSICAL THERAPY CERVICAL Treatment    Patient Name: Bradley [name] MRN: [id number] DOB:[birth year], 76 y.o., male Today's Date: 06/27/2022   PT End of Session - 06/26/22 1521     Visit Number 2    Number of Visits 12    Date for PT Re-Evaluation 07/16/22    Authorization Type HTA 15 dollars    PT Start Time 1430    PT Stop Time 1512    PT Time Calculation (min) 42 min    Activity Tolerance Patient tolerated treatment well    Behavior During Therapy [facility] for tasks assessed/performed              Past Medical History:  Diagnosis Date   Acquired equinus deformity of foot 12/15/2011   Anemia    Arthritis    Carotid artery occlusion    a. Carotid US 10/16: RICA 60-79%, L CEA patent with 1-39% stenosis   CKD (chronic kidney disease)    stage 3-4   Coagulation disorder ([place]) 05/25/2019   Coronary artery disease    a. Myoview 9/16:  EF 52%, inferior fixed defect consistent with diaphragmatic attenuation, intermediate risk secondary to poor exercise tolerance and symptoms during stress;  b. LHC 05/17/2015 90% mid LAD, 80% ost D2, 75% mid RCA, 35% prox RCA. >> S/p CABG   Coronary artery disease involving coronary bypass graft of native heart with angina pectoris ([place])    CORONARY ARTERY BYPASS GRAFTING x 3 - 05/23/2015 Left internal mammary artery to left anterior descending  Saphenous vein graft to diagonal  Saphenous vein graft to posterior descending Endoscopic greater saphenous vein harvest right thigh     Diabetes mellitus age 65   Diverticulitis    Diverticulosis of colon without hemorrhage 05/17/2015   By CT scan    DM type 2, controlled, with complication ([place]) 58/52/7782   Essential hypertension 04/29/2015   GERD (gastroesophageal reflux disease)    H/O hiatal hernia    History of echocardiogram    a. Echo 9/16: GLS -15.2%, EF 42-35%, grade 1 diastolic dysfunction, normal wall motion, aortic sclerosis, dilated aortic root 39 mm, atrial septal lipomatous  hypertrophy   Hyperlipidemia    Hypertension    Irritable bowel syndrome (IBS)    Myocardial infarction ([place])    silent inferior MI; patient denies MI history (03/17/13)    Neuralgia    Neuropathy 2013   OSA on CPAP 05/30/2015   Osteoporosis 2013   PAF (paroxysmal atrial fibrillation) ([place])    post CABG 2016; Amiodarone stopped 2/2 wheezing; PAF 01/12/20   Peripheral vascular disease ([place])    Plantar fasciitis 12/15/2011   Reflux esophagitis    Right bundle branch block 04/30/2015   Sleep apnea    uses CPAP   Stenosis of right internal carotid artery with cerebral infarction ([place]) 12/09/2011   Stress fracture of metatarsal bone 02/18/2012   Stroke ([place]) 1987   Right brain stroke- slight drop on left side   Syncope 01/12/2020   Type 2 diabetes mellitus with vascular disease ([place]) 05/30/2015   Wears glasses    Past Surgical History:  Procedure Laterality Date   ABDOMINAL AORTOGRAM W/LOWER EXTREMITY Bilateral 11/27/2020   Procedure: ABDOMINAL AORTOGRAM W/LOWER EXTREMITY;  Surgeon: Serafina Mitchell, MD;  Location: [facility];  Service: Cardiovascular;  Laterality: Bilateral;   CARDIAC CATHETERIZATION N/A 05/17/2015   Procedure: Left Heart Cath and Coronary Angiography;  Surgeon: Belva Crome, MD;  Location: [facility];  Service: Cardiovascular;  Laterality: N/A;   CAROTID ENDARTERECTOMY  1994 & redo 2001   Left   COLONOSCOPY W/ BIOPSIES AND POLYPECTOMY     CORONARY ARTERY BYPASS GRAFT N/A 05/23/2015   Procedure: CORONARY ARTERY BYPASS GRAFTING (CABG) x 3 (LIMA to LAD, SVG to DIAGONAL 2, SVG to PDA) with Endoscopic Vein Harvesting from right greater saphenous vein;  Surgeon: Grace Isaac, MD;  Location: [facility];  Service: Open Heart Surgery;  Laterality: N/A;   ENDARTERECTOMY FEMORAL Right 11/30/2020   Procedure: ENDARTERECTOMY FEMORAL RIGHT;  Surgeon: Rosetta Posner, MD;  Location: [facility];  Service: Vascular;  Laterality: Right;   FEMORAL-POPLITEAL BYPASS GRAFT Right  11/30/2020   Procedure: BYPASS GRAFT FEMORAL-POPLITEAL ARTERY RIGHT;  Surgeon: Rosetta Posner, MD;  Location: [facility];  Service: Vascular;  Laterality: Right;   FRACTURE SURGERY  2013   Right   foo  t X's 2   ILIAC ARTERY STENT     LAPAROSCOPIC CHOLECYSTECTOMY  09/11/2016   POSTERIOR LUMBAR FUSION  Aug. 14, 2014   Level 1   [place] SURGERY  2014   TEE WITHOUT CARDIOVERSION N/A 05/23/2015   Procedure: TRANSESOPHAGEAL ECHOCARDIOGRAM (TEE);  Surgeon: Grace Isaac, MD;  Location: [facility];  Service: Open Heart Surgery;  Laterality: N/A;   TONSILLECTOMY     TRANSCAROTID ARTERY REVASCULARIZATION  Right 06/01/2020   Procedure: RIGHT TRANSCAROTID ARTERY REVASCULARIZATION;  Surgeon: Serafina Mitchell, MD;  Location: [facility];  Service: Vascular;  Laterality: Right;   TRIGGER FINGER RELEASE Right 07/14/2017   Procedure: RIGHT LONG FINGER TRIGGER RELEASE;  Surgeon: Milly Jakob, MD;  Location: [facility];  Service: Orthopedics;  Laterality: Right;   Patient Active Problem List   Diagnosis Date Noted   Diabetic neuropathy ([place]) 06/03/2022   PAD (peripheral artery disease) ([place]) 11/30/2020   Carotid stenosis 06/01/2020   Carotid stenosis, asymptomatic 06/01/2020   Syncope 01/12/2020   Coagulation disorder ([place]) 05/25/2019   Coronary artery disease involving coronary bypass graft of native heart with angina pectoris ([name])    PAF (paroxysmal atrial fibrillation) ([name])    Type 2 diabetes mellitus with vascular disease ([place]) 05/30/2015   OSA on CPAP 05/30/2015   Abnormal stress test 05/18/2015   Diverticulosis of colon without hemorrhage 05/17/2015   Right bundle branch block 04/30/2015   Essential hypertension 04/29/2015   Hyperlipidemia 04/29/2015   DM type 2, controlled, with complication ([place]) 84/16/6063   Peripheral vascular disease ([place]) 06/08/2012   Stress fracture of metatarsal bone 02/18/2012   Acquired equinus deformity of foot  12/15/2011   Plantar fasciitis 12/15/2011   Stenosis of right internal carotid artery with cerebral infarction ([place]) 12/09/2011    PCP: Lona Kettle MD   REFERRING PROVIDER: Walker Shadow MD  REFERRING DIAG: [phone] (ICD-10-CM) - Other spondylosis with myelopathy, cervical region  THERAPY DIAG:  Cervicalgia  Other abnormalities of gait and mobility  Rationale for Evaluation and Treatment Rehabilitation  ONSET DATE:   SUBJECTIVE:  SUBJECTIVE STATEMENT: THe patient reports the neck is feeling OK. He has had some right hip pain since the fall.  PERTINENT HISTORY:  Anemia, acquired equinus deformity; CAD< CKD, DMII, IBS, MI, MI, neuralgia, PF, carotid stenosis Stroke, Torn Rotator cuff; CABG; vein bypass graft 1998; Spine surgery in 2014     PAIN:  Are you having pain? Yes: NPRS scale: 3/10 Pain location: right side of the neck and head  Pain description: aching  Aggravating factors: just comes on  Relieving factors: goes away with time   PRECAUTIONS: Fall  WEIGHT BEARING RESTRICTIONS: No  FALLS:  Has patient fallen in last 6 months? Yes. Number of falls 2 falls both reaching forward    LIVING ENVIRONMENT: Lives with wife; some steps into the house and stairs inside OCCUPATION: retried   Goes to a gym in [place]:   To have improved balance and to have less neck pain    OBJECTIVE:   DIAGNOSTIC FINDINGS:  Cervical MRI  MRI cervical spine (without) demonstrating: - At C3-4 disc bulging and uncovertebral joint hypertrophy with moderate spinal stenosis and severe bilateral foraminal stenosis; subtle myelomalacia noted within the spinal cord on the left side. - At C4-5 disc bulging and uncovertebral joint hypertrophy with  moderate to severe spinal stenosis and moderate bilateral foraminal stenosis. - At C5-6 disc bulging and facet hypertrophy with moderate spinal stenosis and severe bilateral foraminal stenosis.  Lumbar MRI: 9/10 MRI lumbar spine without contrast demonstrating: - At L5-S1: Anterior spondylolisthesis of L5 on S1 resulting in severe bilateral foraminal stenosis; posterior lumbar interbody fusion and metal hardware as above. - At L2-3: Disc bulging with mild spinal stenosis and mild bilateral foraminal stenosis. - At L4-5: Disc bulging with mild bilateral foraminal stenosis.  PATIENT SURVEYS:  FOTO    COGNITION: Overall cognitive status: Within functional limits for tasks assessed  SENSATION:   POSTURE: rounded shoulders and forward head  PALPATION: Spasming of the upper trap    CERVICAL ROM:   Active ROM A/PROM (deg) eval 12/11  Flexion    Extension  23  Right lateral flexion    Left lateral flexion    Right rotation 45 if he dips his neck he can get 65  55  Left rotation 45 60   (Blank rows = not tested)  UPPER EXTREMITY   MMT Right eval Left eval Right 12/11  Left 12/11  Shoulder flexion 4/5 4/5 4+/5 5/5  Shoulder extension      Shoulder abduction      Shoulder adduction      Shoulder extension      Shoulder internal rotation 4/5 4/5 5/5 5/5  Shoulder external rotation 4/5 4/5 4+/5 4+/5  Elbow flexion      Elbow extension      Wrist flexion      Wrist extension      Wrist ulnar deviation      Wrist radial deviation      Wrist pronation      Wrist supination       (Blank rows = not tested)  Grip right 40 lbs  Grip left 50 lbs    UPPER EXTREMITY MMT:  AROM  Right eval Left eval Right  left  Shoulder flexion 140 140 160  160   Shoulder extension      Shoulder abduction      Shoulder adduction      Shoulder extension      Shoulder internal rotation  Shoulder external rotation No pain but stiff  Pain in the left arm   No pain reaching behind his  head   Middle trapezius      Lower trapezius      Elbow flexion      Elbow extension      Wrist flexion      Wrist extension      Wrist ulnar deviation      Wrist radial deviation      Wrist pronation      Wrist supination      Grip strength       (Blank rows = not tested)  LOWER EXTREMITY MMT:    MMT Right eval Left eval Right  12/11 Left  12/11  Hip flexion 25.9 25.8 34.4 34.9  Hip extension      Hip abduction 36.9 36.4 36.5 39.0  Hip adduction      Hip internal rotation      Hip external rotation      Knee flexion      Knee extension 40 37.5    Ankle dorsiflexion      Ankle plantarflexion      Ankle inversion      Ankle eversion       (Blank rows = not tested)     TODAY'S TREATMENT:                                                                                                                      DATE:  12/26  LTR x20  Gluteal stretch 3x20 sec hold  Nustep x5 min L3  Supine march 2x10  Supine clamshell 2x15  LAQ left 2x10  Right caused peroneal pain   Narrow base of support eyes open 2 x 30 seconds Narrow base of support eyes closed 2 x 30 seconds Tandem stance each leg 2 x 30 seconds more difficulty noted with right leg back   12/11 Manual: trigger point release to upper traps and cerivcal paraspinals; gentle manual traction  Performed re-assessment on the patient. Reviewed test and measures and goals including strength testing, FOTO, and ROM    Seated bilateral external rotation red band 2x15 Standing row red band 2x15 Standing extension red band 2x15   Row 2 x 15 red Shoulder extension 2 x 15 red     11/22 Manual: trigger point release to upper traps and cerivcal paraspinals; gentle manual traction  Row 2 x 15 green Shoulder extension 2 x 15 green  Balance exercises Narrow base of support eyes open 2 x 30 seconds Narrow base of support eyes closed 2 x 30 seconds  Step onto air-ex x20 fwd and side to side   11/22 Manual: trigger  point release to upper traps and cerivcal paraspinals; gentle manual traction  On air-ex Balance exercises Narrow base of support eyes open 2 x 30 seconds Narrow base of support eyes closed 2 x 30 seconds Tandem stance each leg 2 x 30 seconds more difficulty noted with right leg  back  Step onto air-ex x20 fwd and side to side   Row 2 x 15 green Shoulder extension 2 x 15 green  PATIENT EDUCATION:   Education details: HEP and symptom management; improving balance   Person educated: Patient Education method: Explanation, Demonstration, Tactile cues, Verbal cues, and Handouts Education comprehension: verbalized understanding, returned demonstration, verbal cues required, tactile cues required, and needs further education  HOME EXERCISE PROGRAM: Access Code: [email or web address] URL: [email or web address] Date: 06/04/2022 Prepared by: Carolyne Littles  ASSESSMENT:  CLINICAL IMPRESSION: OBJECTIVE IMPAIRMENTS: Abnormal gait, decreased activity tolerance, decreased endurance, decreased mobility, difficulty walking, decreased ROM, decreased strength, increased fascial restrictions, increased muscle spasms, impaired UE functional use, and pain.   ACTIVITY LIMITATIONS: carrying, lifting, bending, standing, squatting, stairs, transfers, and locomotion level  PARTICIPATION LIMITATIONS: cleaning, laundry, shopping, community activity, and yard work  PERSONAL FACTORS: 3+ comorbidities: past CVA, CABG, arthritis in the back   are also affecting patient's functional outcome.   REHAB POTENTIAL: Good  CLINICAL DECISION MAKING: Evolving/moderate complexity fluctuating pain levels in his neck and declining balance   EVALUATION COMPLEXITY: Moderate   GOALS: Goals reviewed with patient? Yes  SHORT TERM GOALS: Target date: 07/25/2022   Patient will increase cervical rotation by 10 degrees  Baseline: Achieved on left not achieved on the right Goal status: INITIAL  2.  Patient will report a  50% reduction in cervical headache  Baseline:  Goal status: Achieved 12/11  3.  Patient will required CGA for narrow base with eyes open.  Baseline:  Goal status: Achieved 12/11  4.  Patient will increase cervical extension by 10 degrees without pain Goal status: New goal 12/11  LONG TERM GOALS: Target date: 08/08/2022  Patient will demonstrate improved balance with all balance tasks to improve safety  Baseline:  Goal status: Improved balance but still high fall risk 12/11  2.  Patient will demonstrate >65 degrees of cervical rotation without pain in order to improve community safety  Baseline:  Goal status: 65 on left 12/11  3.  Patient will be independent with a complete HEP  Baseline:  Goal status: Independent with basic exercise program will continue to expand 12/11   PLAN:  PT FREQUENCY: 2x/week  PT DURATION: 8 weeks  PLANNED INTERVENTIONS: Therapeutic exercises, Therapeutic activity, Neuromuscular re-education, Balance training, Gait training, Patient/Family education, Self Care, Joint mobilization, DME instructions, Aquatic Therapy, Dry Needling, Electrical stimulation, Cryotherapy, Moist heat, Taping, Ultrasound, and Manual therapy  PLAN FOR NEXT SESSION: see MRI of the neck. Keep all movements gentle and without forcing movement. Work on balance exercises. Give patient a general LE strengthening exercise.  Continue with manual therapy for the neck, continue with functional strengthening continue with balance training   Carney Living, PT 06/27/2022, 12:34 PM

## 2022-08-12 DIAGNOSIS — M9901 Segmental and somatic dysfunction of cervical region: Secondary | ICD-10-CM | POA: Diagnosis not present

## 2022-08-12 DIAGNOSIS — M9903 Segmental and somatic dysfunction of lumbar region: Secondary | ICD-10-CM | POA: Diagnosis not present

## 2022-08-12 DIAGNOSIS — M5412 Radiculopathy, cervical region: Secondary | ICD-10-CM | POA: Diagnosis not present

## 2022-08-12 DIAGNOSIS — M5033 Other cervical disc degeneration, cervicothoracic region: Secondary | ICD-10-CM | POA: Diagnosis not present

## 2022-08-13 DIAGNOSIS — E1142 Type 2 diabetes mellitus with diabetic polyneuropathy: Secondary | ICD-10-CM | POA: Diagnosis not present

## 2022-08-14 ENCOUNTER — Ambulatory Visit (HOSPITAL_BASED_OUTPATIENT_CLINIC_OR_DEPARTMENT_OTHER): Payer: PPO | Admitting: Physical Therapy

## 2022-08-14 ENCOUNTER — Encounter (HOSPITAL_BASED_OUTPATIENT_CLINIC_OR_DEPARTMENT_OTHER): Payer: Self-pay

## 2022-08-18 ENCOUNTER — Ambulatory Visit (HOSPITAL_BASED_OUTPATIENT_CLINIC_OR_DEPARTMENT_OTHER): Payer: PPO | Attending: Neurosurgery | Admitting: Physical Therapy

## 2022-08-18 ENCOUNTER — Encounter (HOSPITAL_BASED_OUTPATIENT_CLINIC_OR_DEPARTMENT_OTHER): Payer: Self-pay | Admitting: Physical Therapy

## 2022-08-18 DIAGNOSIS — R2689 Other abnormalities of gait and mobility: Secondary | ICD-10-CM | POA: Diagnosis not present

## 2022-08-18 DIAGNOSIS — M542 Cervicalgia: Secondary | ICD-10-CM | POA: Diagnosis not present

## 2022-08-18 NOTE — Therapy (Signed)
OUTPATIENT PHYSICAL THERAPY CERVICAL Treatment    Patient Name: Bradley [name] MRN: [id number] DOB:1946-02-20, 77 y.o., male Today's Date: 08/18/2022   PT End of Session - 08/18/22 1241     Visit Number 7    Number of Visits 12    Date for PT Re-Evaluation 09/02/22    Authorization Type HTA 15 dollars    PT Start Time 1233    PT Stop Time 1313    PT Time Calculation (min) 40 min    Activity Tolerance Patient tolerated treatment well    Behavior During Therapy [facility] for tasks assessed/performed               Past Medical History:  Diagnosis Date   Acquired equinus deformity of foot 12/15/2011   Anemia    Arthritis    Carotid artery occlusion    a. Carotid US 10/16: RICA 60-79%, L CEA patent with 1-39% stenosis   CKD (chronic kidney disease)    stage 3-4   Coagulation disorder ([place]) 05/25/2019   Coronary artery disease    a. Myoview 9/16:  EF 52%, inferior fixed defect consistent with diaphragmatic attenuation, intermediate risk secondary to poor exercise tolerance and symptoms during stress;  b. LHC 05/17/2015 90% mid LAD, 80% ost D2, 75% mid RCA, 35% prox RCA. >> S/p CABG   Coronary artery disease involving coronary bypass graft of native heart with angina pectoris ([place])    CORONARY ARTERY BYPASS GRAFTING x 3 - 05/23/2015 Left internal mammary artery to left anterior descending  Saphenous vein graft to diagonal  Saphenous vein graft to posterior descending Endoscopic greater saphenous vein harvest right thigh     Diabetes mellitus age 38   Diverticulitis    Diverticulosis of colon without hemorrhage 05/17/2015   By CT scan    DM type 2, controlled, with complication ([place]) 64/33/2951   Essential hypertension 04/29/2015   GERD (gastroesophageal reflux disease)    H/O hiatal hernia    History of echocardiogram    a. Echo 9/16: GLS -15.2%, EF 88-41%, grade 1 diastolic dysfunction, normal wall motion, aortic sclerosis, dilated aortic root 39 mm, atrial septal lipomatous  hypertrophy   Hyperlipidemia    Hypertension    Irritable bowel syndrome (IBS)    Myocardial infarction ([place])    silent inferior MI; patient denies MI history (03/17/13)    Neuralgia    Neuropathy 2013   OSA on CPAP 05/30/2015   Osteoporosis 2013   PAF (paroxysmal atrial fibrillation) ([place])    post CABG 2016; Amiodarone stopped 2/2 wheezing; PAF 01/12/20   Peripheral vascular disease ([place])    Plantar fasciitis 12/15/2011   Reflux esophagitis    Right bundle branch block 04/30/2015   Sleep apnea    uses CPAP   Stenosis of right internal carotid artery with cerebral infarction ([place]) 12/09/2011   Stress fracture of metatarsal bone 02/18/2012   Stroke ([place]) 1987   Right brain stroke- slight drop on left side   Syncope 01/12/2020   Type 2 diabetes mellitus with vascular disease ([place]) 05/30/2015   Wears glasses    Past Surgical History:  Procedure Laterality Date   ABDOMINAL AORTOGRAM W/LOWER EXTREMITY Bilateral 11/27/2020   Procedure: ABDOMINAL AORTOGRAM W/LOWER EXTREMITY;  Surgeon: Serafina Mitchell, MD;  Location: [place] [facility];  Service: Cardiovascular;  Laterality: Bilateral;   CARDIAC CATHETERIZATION N/A 05/17/2015   Procedure: Left Heart Cath and Coronary Angiography;  Surgeon: Belva Crome, MD;  Location: [place] [facility];  Service: Cardiovascular;  Laterality: N/A;   CAROTID ENDARTERECTOMY  1994 & redo 2001   Left   COLONOSCOPY W/ BIOPSIES AND POLYPECTOMY     CORONARY ARTERY BYPASS GRAFT N/A 05/23/2015   Procedure: CORONARY ARTERY BYPASS GRAFTING (CABG) x 3 (LIMA to LAD, SVG to DIAGONAL 2, SVG to PDA) with Endoscopic Vein Harvesting from right greater saphenous vein;  Surgeon: Grace Isaac, MD;  Location: [place];  Service: Open Heart Surgery;  Laterality: N/A;   ENDARTERECTOMY FEMORAL Right 11/30/2020   Procedure: ENDARTERECTOMY FEMORAL RIGHT;  Surgeon: Rosetta Posner, MD;  Location: [place];  Service: Vascular;  Laterality: Right;   FEMORAL-POPLITEAL BYPASS GRAFT Right  11/30/2020   Procedure: BYPASS GRAFT FEMORAL-POPLITEAL ARTERY RIGHT;  Surgeon: Rosetta Posner, MD;  Location: [place];  Service: Vascular;  Laterality: Right;   FRACTURE SURGERY  2013   Right   foo  t X's 2   ILIAC ARTERY STENT     LAPAROSCOPIC CHOLECYSTECTOMY  09/11/2016   POSTERIOR LUMBAR FUSION  Aug. 14, 2014   Level 1   [place] SURGERY  2014   TEE WITHOUT CARDIOVERSION N/A 05/23/2015   Procedure: TRANSESOPHAGEAL ECHOCARDIOGRAM (TEE);  Surgeon: Grace Isaac, MD;  Location: [place];  Service: Open Heart Surgery;  Laterality: N/A;   TONSILLECTOMY     TRANSCAROTID ARTERY REVASCULARIZATION  Right 06/01/2020   Procedure: RIGHT TRANSCAROTID ARTERY REVASCULARIZATION;  Surgeon: Serafina Mitchell, MD;  Location: [facility];  Service: Vascular;  Laterality: Right;   TRIGGER FINGER RELEASE Right 07/14/2017   Procedure: RIGHT LONG FINGER TRIGGER RELEASE;  Surgeon: Milly Jakob, MD;  Location: [place];  Service: Orthopedics;  Laterality: Right;   Patient Active Problem List   Diagnosis Date Noted   Diabetic neuropathy ([place]) 06/03/2022   PAD (peripheral artery disease) ([place]) 11/30/2020   Carotid stenosis 06/01/2020   Carotid stenosis, asymptomatic 06/01/2020   Syncope 01/12/2020   Coagulation disorder ([place]) 05/25/2019   Coronary artery disease involving coronary bypass graft of native heart with angina pectoris ([name])    PAF (paroxysmal atrial fibrillation) ([name])    Type 2 diabetes mellitus with vascular disease ([place]) 05/30/2015   OSA on CPAP 05/30/2015   Abnormal stress test 05/18/2015   Diverticulosis of colon without hemorrhage 05/17/2015   Right bundle branch block 04/30/2015   Essential hypertension 04/29/2015   Hyperlipidemia 04/29/2015   DM type 2, controlled, with complication ([place]) 32/35/5732   Peripheral vascular disease ([place]) 06/08/2012   Stress fracture of metatarsal bone 02/18/2012   Acquired equinus deformity of foot  12/15/2011   Plantar fasciitis 12/15/2011   Stenosis of right internal carotid artery with cerebral infarction ([place]) 12/09/2011    PCP: Lona Kettle MD   REFERRING PROVIDER: Walker Shadow MD  REFERRING DIAG: [phone] (ICD-10-CM) - Other spondylosis with myelopathy, cervical region  THERAPY DIAG:  Cervicalgia  Other abnormalities of gait and mobility  Rationale for Evaluation and Treatment Rehabilitation  ONSET DATE:   SUBJECTIVE:  SUBJECTIVE STATEMENT: The patient woke up this morning with right-sided lower back pain.  He reports some difficulty standing up straight.  He feels like his lower back is affecting his overall balance.  He has had lower back pain in the past but nothing recently.  He reports overall his neck is doing well but his balance has been poor. PERTINENT HISTORY:  Anemia, acquired equinus deformity; CAD< CKD, DMII, IBS, MI, MI, neuralgia, PF, carotid stenosis Stroke, Torn Rotator cuff; CABG; vein bypass graft 1998; Spine surgery in 2014     PAIN:  Are you having pain? Yes: NPRS scale: 3/10 Pain location: right hip  Pain description: aching  Aggravating factors: just comes on  Relieving factors: goes away with time   PRECAUTIONS: Fall  WEIGHT BEARING RESTRICTIONS: No  FALLS:  Has patient fallen in last 6 months? Yes. Number of falls 2 falls both reaching forward    LIVING ENVIRONMENT: Lives with wife; some steps into the house and stairs inside OCCUPATION: retried   Goes to a gym in [place]:   To have improved balance and to have less neck pain    OBJECTIVE:   DIAGNOSTIC FINDINGS:  Cervical MRI  MRI cervical spine (without) demonstrating: - At C3-4 disc bulging and uncovertebral joint hypertrophy with  moderate spinal stenosis and severe bilateral foraminal stenosis; subtle myelomalacia noted within the spinal cord on the left side. - At C4-5 disc bulging and uncovertebral joint hypertrophy with moderate to severe spinal stenosis and moderate bilateral foraminal stenosis. - At C5-6 disc bulging and facet hypertrophy with moderate spinal stenosis and severe bilateral foraminal stenosis.  Lumbar MRI: 9/10 MRI lumbar spine without contrast demonstrating: - At L5-S1: Anterior spondylolisthesis of L5 on S1 resulting in severe bilateral foraminal stenosis; posterior lumbar interbody fusion and metal hardware as above. - At L2-3: Disc bulging with mild spinal stenosis and mild bilateral foraminal stenosis. - At L4-5: Disc bulging with mild bilateral foraminal stenosis.  PATIENT SURVEYS:  FOTO    COGNITION: Overall cognitive status: Within functional limits for tasks assessed  SENSATION:   POSTURE: rounded shoulders and forward head  PALPATION: Spasming of the upper trap    CERVICAL ROM:   Active ROM A/PROM (deg) eval 12/11  Flexion    Extension  23  Right lateral flexion    Left lateral flexion    Right rotation 45 if he dips his neck he can get 65  55  Left rotation 45 60   (Blank rows = not tested)  UPPER EXTREMITY   MMT Right eval Left eval Right 12/11  Left 12/11  Shoulder flexion 4/5 4/5 4+/5 5/5  Shoulder extension      Shoulder abduction      Shoulder adduction      Shoulder extension      Shoulder internal rotation 4/5 4/5 5/5 5/5  Shoulder external rotation 4/5 4/5 4+/5 4+/5  Elbow flexion      Elbow extension      Wrist flexion      Wrist extension      Wrist ulnar deviation      Wrist radial deviation      Wrist pronation      Wrist supination       (Blank rows = not tested)  Grip right 40 lbs  Grip left 50 lbs    UPPER EXTREMITY MMT:  AROM  Right eval Left eval Right  left  Shoulder flexion 140 140 160  160   Shoulder extension       Shoulder abduction      Shoulder adduction      Shoulder extension      Shoulder internal rotation      Shoulder external rotation No pain but stiff  Pain in the left arm   No pain reaching behind his head   Middle trapezius      Lower trapezius      Elbow flexion      Elbow extension      Wrist flexion      Wrist extension      Wrist ulnar deviation      Wrist radial deviation      Wrist pronation      Wrist supination      Grip strength       (Blank rows = not tested)  LOWER EXTREMITY MMT:    MMT Right eval Left eval Right  12/11 Left  12/11  Hip flexion 25.9 25.8 34.4 34.9  Hip extension      Hip abduction 36.9 36.4 36.5 39.0  Hip adduction      Hip internal rotation      Hip external rotation      Knee flexion      Knee extension 40 37.5    Ankle dorsiflexion      Ankle plantarflexion      Ankle inversion      Ankle eversion       (Blank rows = not tested)     TODAY'S TREATMENT:                                                                                                                      DATE:  08/18/2022 Manual: Trigger point release to right side of lumbar spine Demonstrated self trigger point release using Thera cane.  Patient has Thera cane for cervical spine.  Lower trunk rotation x 20 Gluteal stretch 3 x 20-second hold  Heel toe rock to edge of balance with UE control.  Standing slow march 2x10 for balance   Airex: Narrow base of support eyes open 2 x 30 seconds Narrow base of support eyes closed 2 x 30 seconds  Step onto air-ex x20 fwd and side to side   12/26  LTR x20  Gluteal stretch 3x20 sec hold  Nustep x5 min L3  Supine march 2x10  Supine clamshell 2x15  LAQ left 2x10  Right caused peroneal pain   Heel toe rock to edge of balance with UE control.  Standing slow march 2x10 for balance    12/11 Manual: trigger point release to upper traps and cerivcal paraspinals; gentle manual traction  Performed re-assessment on the  patient. Reviewed test and measures and goals including strength testing, FOTO, and ROM    Seated bilateral external rotation red band 2x15 Standing row red band 2x15 Standing extension red band 2x15   Row 2 x 15 red Shoulder extension 2 x 15 red  11/22 Manual: trigger point release to upper traps and cerivcal paraspinals; gentle manual traction  Row 2 x 15 green Shoulder extension 2 x 15 green  Balance exercises Narrow base of support eyes open 2 x 30 seconds Narrow base of support eyes closed 2 x 30 seconds  Step onto air-ex x20 fwd and side to side   11/22 Manual: trigger point release to upper traps and cerivcal paraspinals; gentle manual traction  On air-ex Balance exercises Narrow base of support eyes open 2 x 30 seconds Narrow base of support eyes closed 2 x 30 seconds Tandem stance each leg 2 x 30 seconds more difficulty noted with right leg back  Step onto air-ex x20 fwd and side to side   Row 2 x 15 green Shoulder extension 2 x 15 green  PATIENT EDUCATION:   Education details: HEP and symptom management; improving balance   Person educated: Patient Education method: Explanation, Demonstration, Tactile cues, Verbal cues, and Handouts Education comprehension: verbalized understanding, returned demonstration, verbal cues required, tactile cues required, and needs further education  HOME EXERCISE PROGRAM: Access Code: [email or web address] URL: [email or web address] Date: 06/04/2022 Prepared by: Carolyne Littles  ASSESSMENT:  CLINICAL IMPRESSION:  Patient reports improved lower back pain following treatment.  Patient was able to stand straighter after manual therapy and demonstrated improved hip flexion.  Therapy focused on balance exercises following manual therapy.  He continues to require min assist for balance exercises on Airex.  We will continue to progress balance exercises as tolerated.  He remains a fall risk.  He had a large trigger point in  his lumbar spine.  We reviewed self soft tissue mobilization for low back and neck using Thera cane.  OBJECTIVE IMPAIRMENTS: Abnormal gait, decreased activity tolerance, decreased endurance, decreased mobility, difficulty walking, decreased ROM, decreased strength, increased fascial restrictions, increased muscle spasms, impaired UE functional use, and pain.   ACTIVITY LIMITATIONS: carrying, lifting, bending, standing, squatting, stairs, transfers, and locomotion level  PARTICIPATION LIMITATIONS: cleaning, laundry, shopping, community activity, and yard work  PERSONAL FACTORS: 3+ comorbidities: past CVA, CABG, arthritis in the back   are also affecting patient's functional outcome.   REHAB POTENTIAL: Good  CLINICAL DECISION MAKING: Evolving/moderate complexity fluctuating pain levels in his neck and declining balance   EVALUATION COMPLEXITY: Moderate   GOALS: Goals reviewed with patient? Yes  SHORT TERM GOALS: Target date: 07/25/2022   Patient will increase cervical rotation by 10 degrees  Baseline: Achieved on left not achieved on the right Goal status: INITIAL  2.  Patient will report a 50% reduction in cervical headache  Baseline:  Goal status: Achieved 12/11  3.  Patient will required CGA for narrow base with eyes open.  Baseline:  Goal status: Achieved 12/11  4.  Patient will increase cervical extension by 10 degrees without pain Goal status: New goal 12/11  LONG TERM GOALS: Target date: 08/08/2022  Patient will demonstrate improved balance with all balance tasks to improve safety  Baseline:  Goal status: Improved balance but still high fall risk 12/11  2.  Patient will demonstrate >65 degrees of cervical rotation without pain in order to improve community safety  Baseline:  Goal status: 65 on left 12/11  3.  Patient will be independent with a complete HEP  Baseline:  Goal status: Independent with basic exercise program will continue to expand  12/11   PLAN:  PT FREQUENCY: 2x/week  PT DURATION: 8 weeks  PLANNED INTERVENTIONS: Therapeutic exercises, Therapeutic activity, Neuromuscular re-education, Balance training, Gait training, Patient/Family education,  Self Care, Joint mobilization, DME instructions, Aquatic Therapy, Dry Needling, Electrical stimulation, Cryotherapy, Moist heat, Taping, Ultrasound, and Manual therapy  PLAN FOR NEXT SESSION: see MRI of the neck. Keep all movements gentle and without forcing movement. Work on balance exercises. Give patient a general LE strengthening exercise.  Continue with manual therapy for the neck, continue with functional strengthening continue with balance training   Carney Living, PT 08/18/2022, 8:57 PM

## 2022-08-25 ENCOUNTER — Encounter (HOSPITAL_BASED_OUTPATIENT_CLINIC_OR_DEPARTMENT_OTHER): Payer: PPO | Admitting: Physical Therapy

## 2022-08-25 DIAGNOSIS — M4712 Other spondylosis with myelopathy, cervical region: Secondary | ICD-10-CM | POA: Diagnosis not present

## 2022-08-26 DIAGNOSIS — M5033 Other cervical disc degeneration, cervicothoracic region: Secondary | ICD-10-CM | POA: Diagnosis not present

## 2022-08-26 DIAGNOSIS — M9903 Segmental and somatic dysfunction of lumbar region: Secondary | ICD-10-CM | POA: Diagnosis not present

## 2022-08-26 DIAGNOSIS — M9901 Segmental and somatic dysfunction of cervical region: Secondary | ICD-10-CM | POA: Diagnosis not present

## 2022-08-26 DIAGNOSIS — M5412 Radiculopathy, cervical region: Secondary | ICD-10-CM | POA: Diagnosis not present

## 2022-09-01 DIAGNOSIS — H1045 Other chronic allergic conjunctivitis: Secondary | ICD-10-CM | POA: Diagnosis not present

## 2022-09-01 DIAGNOSIS — E119 Type 2 diabetes mellitus without complications: Secondary | ICD-10-CM | POA: Diagnosis not present

## 2022-09-01 DIAGNOSIS — H40013 Open angle with borderline findings, low risk, bilateral: Secondary | ICD-10-CM | POA: Diagnosis not present

## 2022-09-02 DIAGNOSIS — N400 Enlarged prostate without lower urinary tract symptoms: Secondary | ICD-10-CM | POA: Diagnosis not present

## 2022-09-02 DIAGNOSIS — R310 Gross hematuria: Secondary | ICD-10-CM | POA: Diagnosis not present

## 2022-09-02 DIAGNOSIS — R350 Frequency of micturition: Secondary | ICD-10-CM | POA: Diagnosis not present

## 2022-09-05 ENCOUNTER — Ambulatory Visit (HOSPITAL_BASED_OUTPATIENT_CLINIC_OR_DEPARTMENT_OTHER): Payer: PPO | Admitting: Physical Therapy

## 2022-09-09 DIAGNOSIS — M5033 Other cervical disc degeneration, cervicothoracic region: Secondary | ICD-10-CM | POA: Diagnosis not present

## 2022-09-09 DIAGNOSIS — M5412 Radiculopathy, cervical region: Secondary | ICD-10-CM | POA: Diagnosis not present

## 2022-09-09 DIAGNOSIS — M9903 Segmental and somatic dysfunction of lumbar region: Secondary | ICD-10-CM | POA: Diagnosis not present

## 2022-09-09 DIAGNOSIS — M9901 Segmental and somatic dysfunction of cervical region: Secondary | ICD-10-CM | POA: Diagnosis not present

## 2022-09-15 DIAGNOSIS — E1142 Type 2 diabetes mellitus with diabetic polyneuropathy: Secondary | ICD-10-CM | POA: Diagnosis not present

## 2022-09-17 ENCOUNTER — Ambulatory Visit (HOSPITAL_BASED_OUTPATIENT_CLINIC_OR_DEPARTMENT_OTHER): Payer: PPO | Attending: Neurosurgery | Admitting: Physical Therapy

## 2022-09-17 DIAGNOSIS — M542 Cervicalgia: Secondary | ICD-10-CM | POA: Insufficient documentation

## 2022-09-17 DIAGNOSIS — R2689 Other abnormalities of gait and mobility: Secondary | ICD-10-CM | POA: Insufficient documentation

## 2022-09-17 NOTE — Therapy (Unsigned)
OUTPATIENT PHYSICAL THERAPY CERVICAL Treatment/ Progress note    Patient Name: Bradley [name] MRN: [id number] DOB:Apr 06, 1946, 77 y.o., male Today's Date: 08/18/2022  Progress Note Reporting Period 06/03/2022  to 09/17/2022  See note below for Objective Data and Assessment of Progress/Goals.       PT End of Session - 08/18/22 1241     Visit Number 7    Number of Visits 12    Date for PT Re-Evaluation 09/02/22    Authorization Type HTA 15 dollars    PT Start Time 1233    PT Stop Time 1313    PT Time Calculation (min) 40 min    Activity Tolerance Patient tolerated treatment well    Behavior During Therapy WFL for tasks assessed/performed               Past Medical History:  Diagnosis Date   Acquired equinus deformity of foot 12/15/2011   Anemia    Arthritis    Carotid artery occlusion    a. Carotid US 10/16: RICA 60-79%, L CEA patent with 1-39% stenosis   CKD (chronic kidney disease)    stage 3-4   Coagulation disorder ([place]) 05/25/2019   Coronary artery disease    a. Myoview 9/16:  EF 52%, inferior fixed defect consistent with diaphragmatic attenuation, intermediate risk secondary to poor exercise tolerance and symptoms during stress;  b. LHC 05/17/2015 90% mid LAD, 80% ost D2, 75% mid RCA, 35% prox RCA. >> S/p CABG   Coronary artery disease involving coronary bypass graft of native heart with angina pectoris ([place])    CORONARY ARTERY BYPASS GRAFTING x 3 - 05/23/2015 Left internal mammary artery to left anterior descending  Saphenous vein graft to diagonal  Saphenous vein graft to posterior descending Endoscopic greater saphenous vein harvest right thigh     Diabetes mellitus age 21   Diverticulitis    Diverticulosis of colon without hemorrhage 05/17/2015   By CT scan    DM type 2, controlled, with complication ([place]) [id number]   Essential hypertension 04/29/2015   GERD (gastroesophageal reflux disease)    H/O hiatal hernia    History of echocardiogram    a. Echo  9/16: GLS -15.2%, EF 0000000, grade 1 diastolic dysfunction, normal wall motion, aortic sclerosis, dilated aortic root 39 mm, atrial septal lipomatous hypertrophy   Hyperlipidemia    Hypertension    Irritable bowel syndrome (IBS)    Myocardial infarction ([place])    silent inferior MI; patient denies MI history (03/17/13)    Neuralgia    Neuropathy 2013   OSA on CPAP 05/30/2015   Osteoporosis 2013   PAF (paroxysmal atrial fibrillation) ([place])    post CABG 2016; Amiodarone stopped 2/2 wheezing; PAF 01/12/20   Peripheral vascular disease ([place])    Plantar fasciitis 12/15/2011   Reflux esophagitis    Right bundle branch block 04/30/2015   Sleep apnea    uses CPAP   Stenosis of right internal carotid artery with cerebral infarction ([place]) 12/09/2011   Stress fracture of metatarsal bone 02/18/2012   Stroke ([place]) 1987   Right brain stroke- slight drop on left side   Syncope 01/12/2020   Type 2 diabetes mellitus with vascular disease ([place]) 05/30/2015   Wears glasses    Past Surgical History:  Procedure Laterality Date   ABDOMINAL AORTOGRAM W/LOWER EXTREMITY Bilateral 11/27/2020   Procedure: ABDOMINAL AORTOGRAM W/LOWER EXTREMITY;  Surgeon: Serafina Mitchell, MD;  Location: [facility];  Service: Cardiovascular;  Laterality: Bilateral;   CARDIAC CATHETERIZATION  N/A 05/17/2015   Procedure: Left Heart Cath and Coronary Angiography;  Surgeon: Belva Crome, MD;  Location: [facility];  Service: Cardiovascular;  Laterality: N/A;   CAROTID ENDARTERECTOMY  1994 & redo 2001   Left   COLONOSCOPY W/ BIOPSIES AND POLYPECTOMY     CORONARY ARTERY BYPASS GRAFT N/A 05/23/2015   Procedure: CORONARY ARTERY BYPASS GRAFTING (CABG) x 3 (LIMA to LAD, SVG to DIAGONAL 2, SVG to PDA) with Endoscopic Vein Harvesting from right greater saphenous vein;  Surgeon: Grace Isaac, MD;  Location: [facility];  Service: Open Heart Surgery;  Laterality: N/A;   ENDARTERECTOMY FEMORAL Right 11/30/2020   Procedure:  ENDARTERECTOMY FEMORAL RIGHT;  Surgeon: Rosetta Posner, MD;  Location: [facility];  Service: Vascular;  Laterality: Right;   FEMORAL-POPLITEAL BYPASS GRAFT Right 11/30/2020   Procedure: BYPASS GRAFT FEMORAL-POPLITEAL ARTERY RIGHT;  Surgeon: Rosetta Posner, MD;  Location: [facility];  Service: Vascular;  Laterality: Right;   FRACTURE SURGERY  2013   Right   foo  t X's 2   ILIAC ARTERY STENT     LAPAROSCOPIC CHOLECYSTECTOMY  09/11/2016   POSTERIOR LUMBAR FUSION  Aug. 14, 2014   Level 1   [place] SURGERY  2014   TEE WITHOUT CARDIOVERSION N/A 05/23/2015   Procedure: TRANSESOPHAGEAL ECHOCARDIOGRAM (TEE);  Surgeon: Grace Isaac, MD;  Location: [facility];  Service: Open Heart Surgery;  Laterality: N/A;   TONSILLECTOMY     TRANSCAROTID ARTERY REVASCULARIZATION  Right 06/01/2020   Procedure: RIGHT TRANSCAROTID ARTERY REVASCULARIZATION;  Surgeon: Serafina Mitchell, MD;  Location: [facility];  Service: Vascular;  Laterality: Right;   TRIGGER FINGER RELEASE Right 07/14/2017   Procedure: RIGHT LONG FINGER TRIGGER RELEASE;  Surgeon: Milly Jakob, MD;  Location: [facility];  Service: Orthopedics;  Laterality: Right;   Patient Active Problem List   Diagnosis Date Noted   Diabetic neuropathy ([place]) 06/03/2022   PAD (peripheral artery disease) ([place]) 11/30/2020   Carotid stenosis 06/01/2020   Carotid stenosis, asymptomatic 06/01/2020   Syncope 01/12/2020   Coagulation disorder ([place]) 05/25/2019   Coronary artery disease involving coronary bypass graft of native heart with angina pectoris ([name])    PAF (paroxysmal atrial fibrillation) ([name])    Type 2 diabetes mellitus with vascular disease ([place]) 05/30/2015   OSA on CPAP 05/30/2015   Abnormal stress test 05/18/2015   Diverticulosis of colon without hemorrhage 05/17/2015   Right bundle branch block 04/30/2015   Essential hypertension 04/29/2015   Hyperlipidemia 04/29/2015   DM type 2, controlled, with complication  ([place]) Q000111Q   Peripheral vascular disease ([place]) 06/08/2012   Stress fracture of metatarsal bone 02/18/2012   Acquired equinus deformity of foot 12/15/2011   Plantar fasciitis 12/15/2011   Stenosis of right internal carotid artery with cerebral infarction ([place]) 12/09/2011    PCP: Lona Kettle MD   REFERRING PROVIDER: Walker Shadow MD  REFERRING DIAG: [phone] (ICD-10-CM) - Other spondylosis with myelopathy, cervical region  THERAPY DIAG:  Cervicalgia  Other abnormalities of gait and mobility  Rationale for Evaluation and Treatment Rehabilitation  ONSET DATE:   SUBJECTIVE:  SUBJECTIVE STATEMENT: The patient woke up this morning with right-sided lower back pain.  He reports some difficulty standing up straight.  He feels like his lower back is affecting his overall balance.  He has had lower back pain in the past but nothing recently.  He reports overall his neck is doing well but his balance has been poor. PERTINENT HISTORY:  Anemia, acquired equinus deformity; CAD< CKD, DMII, IBS, MI, MI, neuralgia, PF, carotid stenosis Stroke, Torn Rotator cuff; CABG; vein bypass graft 1998; Spine surgery in 2014     PAIN:  Are you having pain? Yes: NPRS scale: 3/10 Pain location: right hip  Pain description: aching  Aggravating factors: just comes on  Relieving factors: goes away with time   PRECAUTIONS: Fall  WEIGHT BEARING RESTRICTIONS: No  FALLS:  Has patient fallen in last 6 months? Yes. Number of falls 2 falls both reaching forward    LIVING ENVIRONMENT: Lives with wife; some steps into the house and stairs inside OCCUPATION: retried   Goes to a gym in [place]:   To have improved balance and to have less neck pain    OBJECTIVE:    DIAGNOSTIC FINDINGS:  Cervical MRI  MRI cervical spine (without) demonstrating: - At C3-4 disc bulging and uncovertebral joint hypertrophy with moderate spinal stenosis and severe bilateral foraminal stenosis; subtle myelomalacia noted within the spinal cord on the left side. - At C4-5 disc bulging and uncovertebral joint hypertrophy with moderate to severe spinal stenosis and moderate bilateral foraminal stenosis. - At C5-6 disc bulging and facet hypertrophy with moderate spinal stenosis and severe bilateral foraminal stenosis.  Lumbar MRI: 9/10 MRI lumbar spine without contrast demonstrating: - At L5-S1: Anterior spondylolisthesis of L5 on S1 resulting in severe bilateral foraminal stenosis; posterior lumbar interbody fusion and metal hardware as above. - At L2-3: Disc bulging with mild spinal stenosis and mild bilateral foraminal stenosis. - At L4-5: Disc bulging with mild bilateral foraminal stenosis.  PATIENT SURVEYS:  FOTO    COGNITION: Overall cognitive status: Within functional limits for tasks assessed  SENSATION:   POSTURE: rounded shoulders and forward head  PALPATION: Spasming of the upper trap    CERVICAL ROM:   Active ROM A/PROM (deg) eval 12/11   Flexion     Extension  23 37  Right lateral flexion     Left lateral flexion     Right rotation 45 if he dips his neck he can get 65  55 65  Left rotation 45 60 65   (Blank rows = not tested)  UPPER EXTREMITY   MMT Right eval Left eval Right 12/11  Left 12/11  Shoulder flexion 4/5 4/5 4+/5 5/5  Shoulder extension      Shoulder abduction      Shoulder adduction      Shoulder extension      Shoulder internal rotation 4/5 4/5 5/5 5/5  Shoulder external rotation 4/5 4/5 4+/5 4+/5  Elbow flexion      Elbow extension      Wrist flexion      Wrist extension      Wrist ulnar deviation      Wrist radial deviation      Wrist pronation      Wrist supination       (Blank rows = not tested)  Grip right 40  lbs  Grip left 50 lbs    UPPER EXTREMITY MMT:  AROM  Right eval Left eval Right  left Right 2/7  Left 2/7   Shoulder flexion 140 140 160  160  160 160  Shoulder extension        Shoulder abduction        Shoulder adduction        Shoulder extension        Shoulder internal rotation        Shoulder external rotation No pain but stiff  Pain in the left arm   No pain reaching behind his head     Middle trapezius        Lower trapezius        Elbow flexion        Elbow extension        Wrist flexion        Wrist extension        Wrist ulnar deviation        Wrist radial deviation        Wrist pronation        Wrist supination        Grip strength         (Blank rows = not tested)  LOWER EXTREMITY MMT:    MMT Right eval Left eval Right  12/11 Left  12/11  Hip flexion 25.9 25.8 34.4 34.9  Hip extension      Hip abduction 36.9 36.4 36.5 39.0  Hip adduction      Hip internal rotation      Hip external rotation      Knee flexion      Knee extension 40 37.5    Ankle dorsiflexion      Ankle plantarflexion      Ankle inversion      Ankle eversion       (Blank rows = not tested)     TODAY'S TREATMENT:                                                                                                                      DATE:  2/8 Performed re-assessment on the patient. Reviewed test and measures and goals including strength testing, FOTO, and ROM   Reviewed cervical measurements and UE strength measurements  Balance   Airex: Narrow base of support eyes open 2 x 30 seconds Narrow base of support eyes closed 2 x 30 seconds  Step onto air-ex x20 fwd and side to side   Hurdle step fowd and lateral 2x15 each   Rythmic stepping x20   Nu-step 5 min L5   Reviewed HEP and how to use it at home for neck and balance       08/18/2022 Manual: Trigger point release to right side of lumbar spine Demonstrated self trigger point release using Thera cane.  Patient has  Thera cane for cervical spine.  Lower trunk rotation x 20 Gluteal stretch 3 x 20-second hold  Heel toe rock to edge of balance with UE control.  Standing slow march 2x10 for balance   Airex: Narrow base of  support eyes open 2 x 30 seconds Narrow base of support eyes closed 2 x 30 seconds  Step onto air-ex x20 fwd and side to side     PATIENT EDUCATION:   Education details: HEP and symptom management; improving balance   Person educated: Patient Education method: Explanation, Demonstration, Tactile cues, Verbal cues, and Handouts Education comprehension: verbalized understanding, returned demonstration, verbal cues required, tactile cues required, and needs further education  HOME EXERCISE PROGRAM: Access Code: [email or web address] URL: [email or web address] Date: 06/04/2022 Prepared by: Carolyne Littles  ASSESSMENT:  CLINICAL IMPRESSION:  Depsite a 1 week layoff from PT the patient has made progress.  He demonstrated increased cervical rotation, flexion, and extension.  He has improved shoulder motion.  His lower back pain is resolved since his fall.  He continues to feel like his balance is a problem.  He has been working on his balance exercises at home.  He would benefit from continued skilled therapy to continue to work on his balance deficits.  We focused on balance and neuromuscular control today.  Will continue to advance his balance exercises as tolerated.  2/7 benefit from continued skilled therapy 1 week 6.  See below for goal specific progress. OBJECTIVE IMPAIRMENTS: Abnormal gait, decreased activity tolerance, decreased endurance, decreased mobility, difficulty walking, decreased ROM, decreased strength, increased fascial restrictions, increased muscle spasms, impaired UE functional use, and pain.   ACTIVITY LIMITATIONS: carrying, lifting, bending, standing, squatting, stairs, transfers, and locomotion level  PARTICIPATION LIMITATIONS: cleaning, laundry, shopping,  community activity, and yard work  PERSONAL FACTORS: 3+ comorbidities: past CVA, CABG, arthritis in the back   are also affecting patient's functional outcome.   REHAB POTENTIAL: Good  CLINICAL DECISION MAKING: Evolving/moderate complexity fluctuating pain levels in his neck and declining balance   EVALUATION COMPLEXITY: Moderate   GOALS: Goals reviewed with patient? Yes  SHORT TERM GOALS: Target date: 10/16/2022    Patient will increase cervical rotation by 10 degrees  Baseline: Achieved on left not achieved on the right Goal status: Achieved 2/7  2.  Patient will report a 50% reduction in cervical headache  Baseline:  Goal status: Achieved 1 achieved 2/7  3.  Patient will required CGA for narrow base with eyes open.  Baseline:  Goal status: Achieved progressing 2/7 still requires min assist  4.  Patient will increase cervical extension by 10 degrees without pain Goal status: New goal achieved 2/7  LONG TERM GOALS: Target date:10/30/2022    Patient will demonstrate improved balance with all balance tasks to improve safety  Baseline:  Goal status: Improved balance but still high fall risk 12/11  2.  Patient will demonstrate >65 degrees of cervical rotation without pain in order to improve community safety  Baseline:  Goal status: 65 on left 12/11 achieved 2/7  3.  Patient will be independent with a complete HEP  Baseline:  Goal status: Independent with basic exercise program will continue to expand 12/11 we will continue to expand upon balance exercises   PLAN:  PT FREQUENCY: 1 time a week  PT DURATION: 6-week  PLANNED INTERVENTIONS: Therapeutic exercises, Therapeutic activity, Neuromuscular re-education, Balance training, Gait training, Patient/Family education, Self Care, Joint mobilization, DME instructions, Aquatic Therapy, Dry Needling, Electrical stimulation, Cryotherapy, Moist heat, Taping, Ultrasound, and Manual therapy  PLAN FOR NEXT SESSION: see MRI of  the neck. Keep all movements gentle and without forcing movement. Work on balance exercises. Give patient a general LE strengthening exercise.  Continue with manual therapy for the neck, continue with functional strengthening  continue with balance training   Carney Living, PT 08/18/2022, 8:57 PM

## 2022-09-18 ENCOUNTER — Encounter (HOSPITAL_BASED_OUTPATIENT_CLINIC_OR_DEPARTMENT_OTHER): Payer: Self-pay | Admitting: Physical Therapy

## 2022-09-23 ENCOUNTER — Encounter (HOSPITAL_BASED_OUTPATIENT_CLINIC_OR_DEPARTMENT_OTHER): Payer: Self-pay | Admitting: Physical Therapy

## 2022-09-23 ENCOUNTER — Ambulatory Visit (HOSPITAL_BASED_OUTPATIENT_CLINIC_OR_DEPARTMENT_OTHER): Payer: PPO | Admitting: Physical Therapy

## 2022-09-23 DIAGNOSIS — R2689 Other abnormalities of gait and mobility: Secondary | ICD-10-CM

## 2022-09-23 DIAGNOSIS — M9903 Segmental and somatic dysfunction of lumbar region: Secondary | ICD-10-CM | POA: Diagnosis not present

## 2022-09-23 DIAGNOSIS — M5412 Radiculopathy, cervical region: Secondary | ICD-10-CM | POA: Diagnosis not present

## 2022-09-23 DIAGNOSIS — M542 Cervicalgia: Secondary | ICD-10-CM | POA: Diagnosis not present

## 2022-09-23 DIAGNOSIS — M9901 Segmental and somatic dysfunction of cervical region: Secondary | ICD-10-CM | POA: Diagnosis not present

## 2022-09-23 DIAGNOSIS — M5033 Other cervical disc degeneration, cervicothoracic region: Secondary | ICD-10-CM | POA: Diagnosis not present

## 2022-09-23 NOTE — Therapy (Signed)
OUTPATIENT PHYSICAL THERAPY CERVICAL Treatment/ Progress note    Patient Name: Bradley [name] MRN: [id number] DOB:Apr 06, 1946, 77 y.o., male Today's Date: 08/18/2022  Progress Note Reporting Period 06/03/2022  to 09/17/2022  See note below for Objective Data and Assessment of Progress/Goals.       PT End of Session - 08/18/22 1241     Visit Number 7    Number of Visits 12    Date for PT Re-Evaluation 09/02/22    Authorization Type HTA 15 dollars    PT Start Time 1233    PT Stop Time 1313    PT Time Calculation (min) 40 min    Activity Tolerance Patient tolerated treatment well    Behavior During Therapy WFL for tasks assessed/performed               Past Medical History:  Diagnosis Date   Acquired equinus deformity of foot 12/15/2011   Anemia    Arthritis    Carotid artery occlusion    a. Carotid US 10/16: RICA 60-79%, L CEA patent with 1-39% stenosis   CKD (chronic kidney disease)    stage 3-4   Coagulation disorder ([place]) 05/25/2019   Coronary artery disease    a. Myoview 9/16:  EF 52%, inferior fixed defect consistent with diaphragmatic attenuation, intermediate risk secondary to poor exercise tolerance and symptoms during stress;  b. LHC 05/17/2015 90% mid LAD, 80% ost D2, 75% mid RCA, 35% prox RCA. >> S/p CABG   Coronary artery disease involving coronary bypass graft of native heart with angina pectoris ([place])    CORONARY ARTERY BYPASS GRAFTING x 3 - 05/23/2015 Left internal mammary artery to left anterior descending  Saphenous vein graft to diagonal  Saphenous vein graft to posterior descending Endoscopic greater saphenous vein harvest right thigh     Diabetes mellitus age 21   Diverticulitis    Diverticulosis of colon without hemorrhage 05/17/2015   By CT scan    DM type 2, controlled, with complication ([place]) [id number]   Essential hypertension 04/29/2015   GERD (gastroesophageal reflux disease)    H/O hiatal hernia    History of echocardiogram    a. Echo  9/16: GLS -15.2%, EF 0000000, grade 1 diastolic dysfunction, normal wall motion, aortic sclerosis, dilated aortic root 39 mm, atrial septal lipomatous hypertrophy   Hyperlipidemia    Hypertension    Irritable bowel syndrome (IBS)    Myocardial infarction ([place])    silent inferior MI; patient denies MI history (03/17/13)    Neuralgia    Neuropathy 2013   OSA on CPAP 05/30/2015   Osteoporosis 2013   PAF (paroxysmal atrial fibrillation) ([place])    post CABG 2016; Amiodarone stopped 2/2 wheezing; PAF 01/12/20   Peripheral vascular disease ([place])    Plantar fasciitis 12/15/2011   Reflux esophagitis    Right bundle branch block 04/30/2015   Sleep apnea    uses CPAP   Stenosis of right internal carotid artery with cerebral infarction ([place]) 12/09/2011   Stress fracture of metatarsal bone 02/18/2012   Stroke ([place]) 1987   Right brain stroke- slight drop on left side   Syncope 01/12/2020   Type 2 diabetes mellitus with vascular disease ([place]) 05/30/2015   Wears glasses    Past Surgical History:  Procedure Laterality Date   ABDOMINAL AORTOGRAM W/LOWER EXTREMITY Bilateral 11/27/2020   Procedure: ABDOMINAL AORTOGRAM W/LOWER EXTREMITY;  Surgeon: Serafina Mitchell, MD;  Location: [facility];  Service: Cardiovascular;  Laterality: Bilateral;   CARDIAC CATHETERIZATION  N/A 05/17/2015   Procedure: Left Heart Cath and Coronary Angiography;  Surgeon: Belva Crome, MD;  Location: [facility];  Service: Cardiovascular;  Laterality: N/A;   CAROTID ENDARTERECTOMY  1994 & redo 2001   Left   COLONOSCOPY W/ BIOPSIES AND POLYPECTOMY     CORONARY ARTERY BYPASS GRAFT N/A 05/23/2015   Procedure: CORONARY ARTERY BYPASS GRAFTING (CABG) x 3 (LIMA to LAD, SVG to DIAGONAL 2, SVG to PDA) with Endoscopic Vein Harvesting from right greater saphenous vein;  Surgeon: Grace Isaac, MD;  Location: [facility];  Service: Open Heart Surgery;  Laterality: N/A;   ENDARTERECTOMY FEMORAL Right 11/30/2020   Procedure:  ENDARTERECTOMY FEMORAL RIGHT;  Surgeon: Rosetta Posner, MD;  Location: [facility];  Service: Vascular;  Laterality: Right;   FEMORAL-POPLITEAL BYPASS GRAFT Right 11/30/2020   Procedure: BYPASS GRAFT FEMORAL-POPLITEAL ARTERY RIGHT;  Surgeon: Rosetta Posner, MD;  Location: [facility];  Service: Vascular;  Laterality: Right;   FRACTURE SURGERY  2013   Right   foo  t X's 2   ILIAC ARTERY STENT     LAPAROSCOPIC CHOLECYSTECTOMY  09/11/2016   POSTERIOR LUMBAR FUSION  Aug. 14, 2014   Level 1   [place] SURGERY  2014   TEE WITHOUT CARDIOVERSION N/A 05/23/2015   Procedure: TRANSESOPHAGEAL ECHOCARDIOGRAM (TEE);  Surgeon: Grace Isaac, MD;  Location: [facility];  Service: Open Heart Surgery;  Laterality: N/A;   TONSILLECTOMY     TRANSCAROTID ARTERY REVASCULARIZATION  Right 06/01/2020   Procedure: RIGHT TRANSCAROTID ARTERY REVASCULARIZATION;  Surgeon: Serafina Mitchell, MD;  Location: [facility];  Service: Vascular;  Laterality: Right;   TRIGGER FINGER RELEASE Right 07/14/2017   Procedure: RIGHT LONG FINGER TRIGGER RELEASE;  Surgeon: Milly Jakob, MD;  Location: [facility];  Service: Orthopedics;  Laterality: Right;   Patient Active Problem List   Diagnosis Date Noted   Diabetic neuropathy ([place]) 06/03/2022   PAD (peripheral artery disease) ([place]) 11/30/2020   Carotid stenosis 06/01/2020   Carotid stenosis, asymptomatic 06/01/2020   Syncope 01/12/2020   Coagulation disorder ([place]) 05/25/2019   Coronary artery disease involving coronary bypass graft of native heart with angina pectoris ([name])    PAF (paroxysmal atrial fibrillation) ([name])    Type 2 diabetes mellitus with vascular disease ([place]) 05/30/2015   OSA on CPAP 05/30/2015   Abnormal stress test 05/18/2015   Diverticulosis of colon without hemorrhage 05/17/2015   Right bundle branch block 04/30/2015   Essential hypertension 04/29/2015   Hyperlipidemia 04/29/2015   DM type 2, controlled, with complication  ([place]) [id number]   Peripheral vascular disease ([place]) 06/08/2012   Stress fracture of metatarsal bone 02/18/2012   Acquired equinus deformity of foot 12/15/2011   Plantar fasciitis 12/15/2011   Stenosis of right internal carotid artery with cerebral infarction ([place]) 12/09/2011    PCP: Lona Kettle MD   REFERRING PROVIDER: Walker Shadow MD  REFERRING DIAG: [phone] (ICD-10-CM) - Other spondylosis with myelopathy, cervical region  THERAPY DIAG:  Cervicalgia  Other abnormalities of gait and mobility  Rationale for Evaluation and Treatment Rehabilitation  ONSET DATE:   SUBJECTIVE:  SUBJECTIVE STATEMENT: The patient reports no pain in his neck or back. He continues to feel like his balance is a bit off.   PERTINENT HISTORY:  Anemia, acquired equinus deformity; CAD< CKD, DMII, IBS, MI, MI, neuralgia, PF, carotid stenosis Stroke, Torn Rotator cuff; CABG; vein bypass graft 1998; Spine surgery in 2014     PAIN:  Are you having pain? Yes: NPRS scale: 3/10 Pain location: right hip  Pain description: aching  Aggravating factors: just comes on  Relieving factors: goes away with time   PRECAUTIONS: Fall  WEIGHT BEARING RESTRICTIONS: No  FALLS:  Has patient fallen in last 6 months? Yes. Number of falls 2 falls both reaching forward    LIVING ENVIRONMENT: Lives with wife; some steps into the house and stairs inside OCCUPATION: retried   Goes to a gym in [place]:   To have improved balance and to have less neck pain    OBJECTIVE:   DIAGNOSTIC FINDINGS:  Cervical MRI  MRI cervical spine (without) demonstrating: - At C3-4 disc bulging and uncovertebral joint hypertrophy with moderate spinal stenosis and severe bilateral foraminal  stenosis; subtle myelomalacia noted within the spinal cord on the left side. - At C4-5 disc bulging and uncovertebral joint hypertrophy with moderate to severe spinal stenosis and moderate bilateral foraminal stenosis. - At C5-6 disc bulging and facet hypertrophy with moderate spinal stenosis and severe bilateral foraminal stenosis.  Lumbar MRI: 9/10 MRI lumbar spine without contrast demonstrating: - At L5-S1: Anterior spondylolisthesis of L5 on S1 resulting in severe bilateral foraminal stenosis; posterior lumbar interbody fusion and metal hardware as above. - At L2-3: Disc bulging with mild spinal stenosis and mild bilateral foraminal stenosis. - At L4-5: Disc bulging with mild bilateral foraminal stenosis.  PATIENT SURVEYS:  FOTO    COGNITION: Overall cognitive status: Within functional limits for tasks assessed  SENSATION:   POSTURE: rounded shoulders and forward head  PALPATION: Spasming of the upper trap    CERVICAL ROM:   Active ROM A/PROM (deg) eval 12/11   Flexion     Extension  23 37  Right lateral flexion     Left lateral flexion     Right rotation 45 if he dips his neck he can get 65  55 65  Left rotation 45 60 65   (Blank rows = not tested)  UPPER EXTREMITY   MMT Right eval Left eval Right 12/11  Left 12/11  Shoulder flexion 4/5 4/5 4+/5 5/5  Shoulder extension      Shoulder abduction      Shoulder adduction      Shoulder extension      Shoulder internal rotation 4/5 4/5 5/5 5/5  Shoulder external rotation 4/5 4/5 4+/5 4+/5  Elbow flexion      Elbow extension      Wrist flexion      Wrist extension      Wrist ulnar deviation      Wrist radial deviation      Wrist pronation      Wrist supination       (Blank rows = not tested)  Grip right 40 lbs  Grip left 50 lbs    UPPER EXTREMITY MMT:  AROM  Right eval Left eval Right  left Right 2/7  Left 2/7   Shoulder flexion 140 140 160  160  160 160  Shoulder extension        Shoulder  abduction  Shoulder adduction        Shoulder extension        Shoulder internal rotation        Shoulder external rotation No pain but stiff  Pain in the left arm   No pain reaching behind his head     Middle trapezius        Lower trapezius        Elbow flexion        Elbow extension        Wrist flexion        Wrist extension        Wrist ulnar deviation        Wrist radial deviation        Wrist pronation        Wrist supination        Grip strength         (Blank rows = not tested)  LOWER EXTREMITY MMT:    MMT Right eval Left eval Right  12/11 Left  12/11  Hip flexion 25.9 25.8 34.4 34.9  Hip extension      Hip abduction 36.9 36.4 36.5 39.0  Hip adduction      Hip internal rotation      Hip external rotation      Knee flexion      Knee extension 40 37.5    Ankle dorsiflexion      Ankle plantarflexion      Ankle inversion      Ankle eversion       (Blank rows = not tested)     TODAY'S TREATMENT:                                                                                                                      DATE:  2/13 Nu-step 5 min  LAQ x20 each leg green  Hip abduction 2x20 green   Airex: Narrow base of support eyes open 2 x 30 seconds Narrow base of support eyes closed 2 x 30 seconds  Step onto air-ex x20 fwd and side to side   Heel toe rock on air-ex x20  March with min UE support on the air-ex  Hurdles fwd and back 2 laps Side steps 2 laps  In and out of hurdles 4 laps       2/8 Performed re-assessment on the patient. Reviewed test and measures and goals including strength testing, FOTO, and ROM   Reviewed cervical measurements and UE strength measurements  Balance   Airex: Narrow base of support eyes open 2 x 30 seconds Narrow base of support eyes closed 2 x 30 seconds  Step onto air-ex x20 fwd and side to side   Hurdle step fowd and lateral 2x15 each   Rythmic stepping x20   Nu-step 5 min L5   Reviewed HEP and how  to use it at home for neck and balance       08/18/2022 Manual: Trigger point release to right side of lumbar  spine Demonstrated self trigger point release using Thera cane.  Patient has Thera cane for cervical spine.  Lower trunk rotation x 20 Gluteal stretch 3 x 20-second hold  Heel toe rock to edge of balance with UE control.  Standing slow march 2x10 for balance   Airex: Narrow base of support eyes open 2 x 30 seconds Narrow base of support eyes closed 2 x 30 seconds  Step onto air-ex x20 fwd and side to side     PATIENT EDUCATION:   Education details: HEP and symptom management; improving balance   Person educated: Patient Education method: Explanation, Demonstration, Tactile cues, Verbal cues, and Handouts Education comprehension: verbalized understanding, returned demonstration, verbal cues required, tactile cues required, and needs further education  HOME EXERCISE PROGRAM: Access Code: [email or web address] URL: [email or web address] Date: 06/04/2022 Prepared by: Carolyne Littles  ASSESSMENT:  CLINICAL IMPRESSION: The patient was mildly fatigued with movement exercises today. He required min a for balance with walking in and out of the hurdles. We focused mostly on balance today. Therapy will continue to progress as tolerated.    OBJECTIVE IMPAIRMENTS: Abnormal gait, decreased activity tolerance, decreased endurance, decreased mobility, difficulty walking, decreased ROM, decreased strength, increased fascial restrictions, increased muscle spasms, impaired UE functional use, and pain.   ACTIVITY LIMITATIONS: carrying, lifting, bending, standing, squatting, stairs, transfers, and locomotion level  PARTICIPATION LIMITATIONS: cleaning, laundry, shopping, community activity, and yard work  PERSONAL FACTORS: 3+ comorbidities: past CVA, CABG, arthritis in the back   are also affecting patient's functional outcome.   REHAB POTENTIAL: Good  CLINICAL DECISION MAKING:  Evolving/moderate complexity fluctuating pain levels in his neck and declining balance   EVALUATION COMPLEXITY: Moderate   GOALS: Goals reviewed with patient? Yes  SHORT TERM GOALS: Target date: 10/16/2022    Patient will increase cervical rotation by 10 degrees  Baseline: Achieved on left not achieved on the right Goal status: Achieved 2/7  2.  Patient will report a 50% reduction in cervical headache  Baseline:  Goal status: Achieved 1 achieved 2/7  3.  Patient will required CGA for narrow base with eyes open.  Baseline:  Goal status: Achieved progressing 2/7 still requires min assist  4.  Patient will increase cervical extension by 10 degrees without pain Goal status: New goal achieved 2/7  LONG TERM GOALS: Target date:10/30/2022    Patient will demonstrate improved balance with all balance tasks to improve safety  Baseline:  Goal status: Improved balance but still high fall risk 12/11  2.  Patient will demonstrate >65 degrees of cervical rotation without pain in order to improve community safety  Baseline:  Goal status: 65 on left 12/11 achieved 2/7  3.  Patient will be independent with a complete HEP  Baseline:  Goal status: Independent with basic exercise program will continue to expand 12/11 we will continue to expand upon balance exercises   PLAN:  PT FREQUENCY: 1 time a week  PT DURATION: 6-week  PLANNED INTERVENTIONS: Therapeutic exercises, Therapeutic activity, Neuromuscular re-education, Balance training, Gait training, Patient/Family education, Self Care, Joint mobilization, DME instructions, Aquatic Therapy, Dry Needling, Electrical stimulation, Cryotherapy, Moist heat, Taping, Ultrasound, and Manual therapy  PLAN FOR NEXT SESSION: see MRI of the neck. Keep all movements gentle and without forcing movement. Work on balance exercises. Give patient a general LE strengthening exercise.  Continue with manual therapy for the neck, continue with functional  strengthening continue with balance training   Carney Living, PT 08/18/2022, 8:57 PM

## 2022-09-24 ENCOUNTER — Encounter (HOSPITAL_BASED_OUTPATIENT_CLINIC_OR_DEPARTMENT_OTHER): Payer: Self-pay | Admitting: Physical Therapy

## 2022-10-01 ENCOUNTER — Ambulatory Visit (HOSPITAL_BASED_OUTPATIENT_CLINIC_OR_DEPARTMENT_OTHER): Payer: PPO | Admitting: Physical Therapy

## 2022-10-01 ENCOUNTER — Encounter (HOSPITAL_BASED_OUTPATIENT_CLINIC_OR_DEPARTMENT_OTHER): Payer: Self-pay | Admitting: Physical Therapy

## 2022-10-01 DIAGNOSIS — M542 Cervicalgia: Secondary | ICD-10-CM

## 2022-10-01 DIAGNOSIS — R2689 Other abnormalities of gait and mobility: Secondary | ICD-10-CM

## 2022-10-01 NOTE — Therapy (Signed)
OUTPATIENT PHYSICAL THERAPY CERVICAL Treatment/ Progress note    Patient Name: Bradley [name] MRN: [id number] DOB:12/02/1945, 77 y.o., male Today's Date: 10/01/2022  Progress Note Reporting Period 06/03/2022  to 09/17/2022  See note below for Objective Data and Assessment of Progress/Goals.       PT End of Session - 10/01/22 1537     Visit Number 10    Number of Visits 12    Date for PT Re-Evaluation 10/30/22    Authorization Type HTA 15 dollars; progress note done on visit 5 next at 18    PT Start Time 1430    PT Stop Time 1513    PT Time Calculation (min) 43 min    Activity Tolerance Patient tolerated treatment well    Behavior During Therapy [facility] for tasks assessed/performed               Past Medical History:  Diagnosis Date   Acquired equinus deformity of foot 12/15/2011   Anemia    Arthritis    Carotid artery occlusion    a. Carotid US 10/16: RICA 60-79%, L CEA patent with 1-39% stenosis   CKD (chronic kidney disease)    stage 3-4   Coagulation disorder ([place]) 05/25/2019   Coronary artery disease    a. Myoview 9/16:  EF 52%, inferior fixed defect consistent with diaphragmatic attenuation, intermediate risk secondary to poor exercise tolerance and symptoms during stress;  b. LHC 05/17/2015 90% mid LAD, 80% ost D2, 75% mid RCA, 35% prox RCA. >> S/p CABG   Coronary artery disease involving coronary bypass graft of native heart with angina pectoris ([place])    CORONARY ARTERY BYPASS GRAFTING x 3 - 05/23/2015 Left internal mammary artery to left anterior descending  Saphenous vein graft to diagonal  Saphenous vein graft to posterior descending Endoscopic greater saphenous vein harvest right thigh     Diabetes mellitus age 72   Diverticulitis    Diverticulosis of colon without hemorrhage 05/17/2015   By CT scan    DM type 2, controlled, with complication ([place]) [id number]   Essential hypertension 04/29/2015   GERD (gastroesophageal reflux disease)    H/O hiatal hernia     History of echocardiogram    a. Echo 9/16: GLS -15.2%, EF 0000000, grade 1 diastolic dysfunction, normal wall motion, aortic sclerosis, dilated aortic root 39 mm, atrial septal lipomatous hypertrophy   Hyperlipidemia    Hypertension    Irritable bowel syndrome (IBS)    Myocardial infarction ([place])    silent inferior MI; patient denies MI history (03/17/13)    Neuralgia    Neuropathy 2013   OSA on CPAP 05/30/2015   Osteoporosis 2013   PAF (paroxysmal atrial fibrillation) ([place])    post CABG 2016; Amiodarone stopped 2/2 wheezing; PAF 01/12/20   Peripheral vascular disease ([place])    Plantar fasciitis 12/15/2011   Reflux esophagitis    Right bundle branch block 04/30/2015   Sleep apnea    uses CPAP   Stenosis of right internal carotid artery with cerebral infarction ([place]) 12/09/2011   Stress fracture of metatarsal bone 02/18/2012   Stroke ([place]) 1987   Right brain stroke- slight drop on left side   Syncope 01/12/2020   Type 2 diabetes mellitus with vascular disease ([place]) 05/30/2015   Wears glasses    Past Surgical History:  Procedure Laterality Date   ABDOMINAL AORTOGRAM W/LOWER EXTREMITY Bilateral 11/27/2020   Procedure: ABDOMINAL AORTOGRAM W/LOWER EXTREMITY;  Surgeon: Serafina Mitchell, MD;  Location: [place] [facility];  Service: Cardiovascular;  Laterality: Bilateral;   CARDIAC CATHETERIZATION N/A 05/17/2015   Procedure: Left Heart Cath and Coronary Angiography;  Surgeon: Belva Crome, MD;  Location: [place] [facility];  Service: Cardiovascular;  Laterality: N/A;   CAROTID ENDARTERECTOMY  1994 & redo 2001   Left   COLONOSCOPY W/ BIOPSIES AND POLYPECTOMY     CORONARY ARTERY BYPASS GRAFT N/A 05/23/2015   Procedure: CORONARY ARTERY BYPASS GRAFTING (CABG) x 3 (LIMA to LAD, SVG to DIAGONAL 2, SVG to PDA) with Endoscopic Vein Harvesting from right greater saphenous vein;  Surgeon: Grace Isaac, MD;  Location: [place];  Service: Open Heart Surgery;  Laterality: N/A;   ENDARTERECTOMY  FEMORAL Right 11/30/2020   Procedure: ENDARTERECTOMY FEMORAL RIGHT;  Surgeon: Rosetta Posner, MD;  Location: [place];  Service: Vascular;  Laterality: Right;   FEMORAL-POPLITEAL BYPASS GRAFT Right 11/30/2020   Procedure: BYPASS GRAFT FEMORAL-POPLITEAL ARTERY RIGHT;  Surgeon: Rosetta Posner, MD;  Location: [place];  Service: Vascular;  Laterality: Right;   FRACTURE SURGERY  2013   Right   foo  t X's 2   ILIAC ARTERY STENT     LAPAROSCOPIC CHOLECYSTECTOMY  09/11/2016   POSTERIOR LUMBAR FUSION  Aug. 14, 2014   Level 1   [facility]  2014   TEE WITHOUT CARDIOVERSION N/A 05/23/2015   Procedure: TRANSESOPHAGEAL ECHOCARDIOGRAM (TEE);  Surgeon: Grace Isaac, MD;  Location: [place];  Service: Open Heart Surgery;  Laterality: N/A;   TONSILLECTOMY     TRANSCAROTID ARTERY REVASCULARIZATION  Right 06/01/2020   Procedure: RIGHT TRANSCAROTID ARTERY REVASCULARIZATION;  Surgeon: Serafina Mitchell, MD;  Location: [facility];  Service: Vascular;  Laterality: Right;   TRIGGER FINGER RELEASE Right 07/14/2017   Procedure: RIGHT LONG FINGER TRIGGER RELEASE;  Surgeon: Milly Jakob, MD;  Location: [place];  Service: Orthopedics;  Laterality: Right;   Patient Active Problem List   Diagnosis Date Noted   Diabetic neuropathy ([place]) 06/03/2022   PAD (peripheral artery disease) ([place]) 11/30/2020   Carotid stenosis 06/01/2020   Carotid stenosis, asymptomatic 06/01/2020   Syncope 01/12/2020   Coagulation disorder ([place]) 05/25/2019   Coronary artery disease involving coronary bypass graft of native heart with angina pectoris ([name])    PAF (paroxysmal atrial fibrillation) ([name])    Type 2 diabetes mellitus with vascular disease ([place]) 05/30/2015   OSA on CPAP 05/30/2015   Abnormal stress test 05/18/2015   Diverticulosis of colon without hemorrhage 05/17/2015   Right bundle branch block 04/30/2015   Essential hypertension 04/29/2015   Hyperlipidemia 04/29/2015   DM  type 2, controlled, with complication ([place]) [id number]   Peripheral vascular disease ([place]) 06/08/2012   Stress fracture of metatarsal bone 02/18/2012   Acquired equinus deformity of foot 12/15/2011   Plantar fasciitis 12/15/2011   Stenosis of right internal carotid artery with cerebral infarction ([place]) 12/09/2011    PCP: Lona Kettle MD   REFERRING PROVIDER: Walker Shadow MD  REFERRING DIAG: [phone] (ICD-10-CM) - Other spondylosis with myelopathy, cervical region  THERAPY DIAG:  Cervicalgia  Other abnormalities of gait and mobility  Rationale for Evaluation and Treatment Rehabilitation  ONSET DATE:   SUBJECTIVE:  SUBJECTIVE STATEMENT: The patient reports no pain in his neck or back. He continues to feel like his balance is a bit off.   PERTINENT HISTORY:  Anemia, acquired equinus deformity; CAD< CKD, DMII, IBS, MI, MI, neuralgia, PF, carotid stenosis Stroke, Torn Rotator cuff; CABG; vein bypass graft 1998; Spine surgery in 2014     PAIN:  Are you having pain? Yes: NPRS scale: 3/10 Pain location: right hip  Pain description: aching  Aggravating factors: just comes on  Relieving factors: goes away with time   PRECAUTIONS: Fall  WEIGHT BEARING RESTRICTIONS: No  FALLS:  Has patient fallen in last 6 months? Yes. Number of falls 2 falls both reaching forward    LIVING ENVIRONMENT: Lives with wife; some steps into the house and stairs inside OCCUPATION: retried   Goes to a gym in [place]:   To have improved balance and to have less neck pain    OBJECTIVE:   DIAGNOSTIC FINDINGS:  Cervical MRI  MRI cervical spine (without) demonstrating: - At C3-4 disc bulging and uncovertebral joint hypertrophy with moderate spinal stenosis  and severe bilateral foraminal stenosis; subtle myelomalacia noted within the spinal cord on the left side. - At C4-5 disc bulging and uncovertebral joint hypertrophy with moderate to severe spinal stenosis and moderate bilateral foraminal stenosis. - At C5-6 disc bulging and facet hypertrophy with moderate spinal stenosis and severe bilateral foraminal stenosis.  Lumbar MRI: 9/10 MRI lumbar spine without contrast demonstrating: - At L5-S1: Anterior spondylolisthesis of L5 on S1 resulting in severe bilateral foraminal stenosis; posterior lumbar interbody fusion and metal hardware as above. - At L2-3: Disc bulging with mild spinal stenosis and mild bilateral foraminal stenosis. - At L4-5: Disc bulging with mild bilateral foraminal stenosis.  PATIENT SURVEYS:  FOTO    COGNITION: Overall cognitive status: Within functional limits for tasks assessed  SENSATION:   POSTURE: rounded shoulders and forward head  PALPATION: Spasming of the upper trap    CERVICAL ROM:   Active ROM A/PROM (deg) eval 12/11   Flexion     Extension  23 37  Right lateral flexion     Left lateral flexion     Right rotation 45 if he dips his neck he can get 65  55 65  Left rotation 45 60 65   (Blank rows = not tested)  UPPER EXTREMITY   MMT Right eval Left eval Right 12/11  Left 12/11  Shoulder flexion 4/5 4/5 4+/5 5/5  Shoulder extension      Shoulder abduction      Shoulder adduction      Shoulder extension      Shoulder internal rotation 4/5 4/5 5/5 5/5  Shoulder external rotation 4/5 4/5 4+/5 4+/5  Elbow flexion      Elbow extension      Wrist flexion      Wrist extension      Wrist ulnar deviation      Wrist radial deviation      Wrist pronation      Wrist supination       (Blank rows = not tested)  Grip right 40 lbs  Grip left 50 lbs    UPPER EXTREMITY MMT:  AROM  Right eval Left eval Right  left Right 2/7  Left 2/7   Shoulder flexion 140 140 160  160  160 160  Shoulder  extension        Shoulder abduction  Shoulder adduction        Shoulder extension        Shoulder internal rotation        Shoulder external rotation No pain but stiff  Pain in the left arm   No pain reaching behind his head     Middle trapezius        Lower trapezius        Elbow flexion        Elbow extension        Wrist flexion        Wrist extension        Wrist ulnar deviation        Wrist radial deviation        Wrist pronation        Wrist supination        Grip strength         (Blank rows = not tested)  LOWER EXTREMITY MMT:    MMT Right eval Left eval Right  12/11 Left  12/11  Hip flexion 25.9 25.8 34.4 34.9  Hip extension      Hip abduction 36.9 36.4 36.5 39.0  Hip adduction      Hip internal rotation      Hip external rotation      Knee flexion      Knee extension 40 37.5    Ankle dorsiflexion      Ankle plantarflexion      Ankle inversion      Ankle eversion       (Blank rows = not tested)     TODAY'S TREATMENT:                                                                                                                      DATE:  2/21 Nu-step 5 min  LAQ x20 each leg Blue  Hip abduction 2x20 Blue   Airex: Narrow base of support eyes open 2 x 30 seconds Narrow base of support eyes closed 2 x 30 seconds  Step onto air-ex x20 fwd and side to side   Heel toe rock on air-ex x20  March with min UE support on the air-ex  Hurdles fwd and back 2 laps Side steps 2 laps  In and out of hurdles 4 laps     2/13 Nu-step 5 min  LAQ x20 each leg green  Hip abduction 2x20 green   Airex: Narrow base of support eyes open 2 x 30 seconds Narrow base of support eyes closed 2 x 30 seconds  Step onto air-ex x20 fwd and side to side   Heel toe rock on air-ex x20  March with min UE support on the air-ex  Hurdles fwd and back 2 laps Side steps 2 laps  In and out of hurdles 4 laps       PATIENT EDUCATION:   Education details: HEP and  symptom management; improving balance   Person educated: Patient Education method: Explanation, Demonstration, Tactile cues, Verbal cues,  and Handouts Education comprehension: verbalized understanding, returned demonstration, verbal cues required, tactile cues required, and needs further education  HOME EXERCISE PROGRAM: Access Code: [email or web address] URL: [email or web address] Date: 06/04/2022 Prepared by: Carolyne Littles  ASSESSMENT:  CLINICAL IMPRESSION:  The patient is progressing. He had no major difficulty with his balance. Some exercises that he had some difficulty with the last time, he did better with today. We will continue to advance balance exercises as tolerated.   OBJECTIVE IMPAIRMENTS: Abnormal gait, decreased activity tolerance, decreased endurance, decreased mobility, difficulty walking, decreased ROM, decreased strength, increased fascial restrictions, increased muscle spasms, impaired UE functional use, and pain.   ACTIVITY LIMITATIONS: carrying, lifting, bending, standing, squatting, stairs, transfers, and locomotion level  PARTICIPATION LIMITATIONS: cleaning, laundry, shopping, community activity, and yard work  PERSONAL FACTORS: 3+ comorbidities: past CVA, CABG, arthritis in the back   are also affecting patient's functional outcome.   REHAB POTENTIAL: Good  CLINICAL DECISION MAKING: Evolving/moderate complexity fluctuating pain levels in his neck and declining balance   EVALUATION COMPLEXITY: Moderate   GOALS: Goals reviewed with patient? Yes  SHORT TERM GOALS: Target date: 10/16/2022    Patient will increase cervical rotation by 10 degrees  Baseline: Achieved on left not achieved on the right Goal status: Achieved 2/7  2.  Patient will report a 50% reduction in cervical headache  Baseline:  Goal status: Achieved 1 achieved 2/7  3.  Patient will required CGA for narrow base with eyes open.  Baseline:  Goal status: Achieved progressing 2/7 still  requires min assist  4.  Patient will increase cervical extension by 10 degrees without pain Goal status: New goal achieved 2/7  LONG TERM GOALS: Target date:10/30/2022    Patient will demonstrate improved balance with all balance tasks to improve safety  Baseline:  Goal status: Improved balance but still high fall risk 12/11  2.  Patient will demonstrate >65 degrees of cervical rotation without pain in order to improve community safety  Baseline:  Goal status: 65 on left 12/11 achieved 2/7  3.  Patient will be independent with a complete HEP  Baseline:  Goal status: Independent with basic exercise program will continue to expand 12/11 we will continue to expand upon balance exercises   PLAN:  PT FREQUENCY: 1 time a week  PT DURATION: 6-week  PLANNED INTERVENTIONS: Therapeutic exercises, Therapeutic activity, Neuromuscular re-education, Balance training, Gait training, Patient/Family education, Self Care, Joint mobilization, DME instructions, Aquatic Therapy, Dry Needling, Electrical stimulation, Cryotherapy, Moist heat, Taping, Ultrasound, and Manual therapy  PLAN FOR NEXT SESSION: see MRI of the neck. Keep all movements gentle and without forcing movement. Work on balance exercises. Give patient a general LE strengthening exercise.  Continue with manual therapy for the neck, continue with functional strengthening continue with balance training   Carney Living, PT 10/01/2022, 3:53 PM

## 2022-10-07 DIAGNOSIS — M5033 Other cervical disc degeneration, cervicothoracic region: Secondary | ICD-10-CM | POA: Diagnosis not present

## 2022-10-07 DIAGNOSIS — M9903 Segmental and somatic dysfunction of lumbar region: Secondary | ICD-10-CM | POA: Diagnosis not present

## 2022-10-07 DIAGNOSIS — M9901 Segmental and somatic dysfunction of cervical region: Secondary | ICD-10-CM | POA: Diagnosis not present

## 2022-10-07 DIAGNOSIS — M5412 Radiculopathy, cervical region: Secondary | ICD-10-CM | POA: Diagnosis not present

## 2022-10-10 ENCOUNTER — Encounter (HOSPITAL_BASED_OUTPATIENT_CLINIC_OR_DEPARTMENT_OTHER): Payer: Self-pay | Admitting: Physical Therapy

## 2022-10-10 ENCOUNTER — Ambulatory Visit (HOSPITAL_BASED_OUTPATIENT_CLINIC_OR_DEPARTMENT_OTHER): Payer: PPO | Attending: Neurosurgery | Admitting: Physical Therapy

## 2022-10-10 ENCOUNTER — Other Ambulatory Visit: Payer: Self-pay | Admitting: *Deleted

## 2022-10-10 DIAGNOSIS — M542 Cervicalgia: Secondary | ICD-10-CM | POA: Diagnosis not present

## 2022-10-10 DIAGNOSIS — R2689 Other abnormalities of gait and mobility: Secondary | ICD-10-CM

## 2022-10-10 DIAGNOSIS — I739 Peripheral vascular disease, unspecified: Secondary | ICD-10-CM

## 2022-10-10 NOTE — Therapy (Signed)
OUTPATIENT PHYSICAL THERAPY CERVICAL Treatment Patient Name: Bradley [name] MRN: [id number] DOB:09/01/1945, 77 y.o., male Today's Date: 10/10/2022      PT End of Session - 10/10/22 1421     Visit Number 11    Number of Visits 12    Date for PT Re-Evaluation 10/30/22    Authorization Type HTA 15 dollars; progress note done on visit 5 next at 81    PT Start Time 1345    PT Stop Time 1427    PT Time Calculation (min) 42 min    Activity Tolerance Patient tolerated treatment well    Behavior During Therapy [facility] for tasks assessed/performed               Past Medical History:  Diagnosis Date   Acquired equinus deformity of foot 12/15/2011   Anemia    Arthritis    Carotid artery occlusion    a. Carotid US 10/16: RICA 60-79%, L CEA patent with 1-39% stenosis   CKD (chronic kidney disease)    stage 3-4   Coagulation disorder ([place]) 05/25/2019   Coronary artery disease    a. Myoview 9/16:  EF 52%, inferior fixed defect consistent with diaphragmatic attenuation, intermediate risk secondary to poor exercise tolerance and symptoms during stress;  b. LHC 05/17/2015 90% mid LAD, 80% ost D2, 75% mid RCA, 35% prox RCA. >> S/p CABG   Coronary artery disease involving coronary bypass graft of native heart with angina pectoris ([place])    CORONARY ARTERY BYPASS GRAFTING x 3 - 05/23/2015 Left internal mammary artery to left anterior descending  Saphenous vein graft to diagonal  Saphenous vein graft to posterior descending Endoscopic greater saphenous vein harvest right thigh     Diabetes mellitus age 60   Diverticulitis    Diverticulosis of colon without hemorrhage 05/17/2015   By CT scan    DM type 2, controlled, with complication ([place]) [id number]   Essential hypertension 04/29/2015   GERD (gastroesophageal reflux disease)    H/O hiatal hernia    History of echocardiogram    a. Echo 9/16: GLS -15.2%, EF 0000000, grade 1 diastolic dysfunction, normal wall motion, aortic sclerosis, dilated  aortic root 39 mm, atrial septal lipomatous hypertrophy   Hyperlipidemia    Hypertension    Irritable bowel syndrome (IBS)    Myocardial infarction ([place])    silent inferior MI; patient denies MI history (03/17/13)    Neuralgia    Neuropathy 2013   OSA on CPAP 05/30/2015   Osteoporosis 2013   PAF (paroxysmal atrial fibrillation) ([place])    post CABG 2016; Amiodarone stopped 2/2 wheezing; PAF 01/12/20   Peripheral vascular disease ([place])    Plantar fasciitis 12/15/2011   Reflux esophagitis    Right bundle branch block 04/30/2015   Sleep apnea    uses CPAP   Stenosis of right internal carotid artery with cerebral infarction ([place]) 12/09/2011   Stress fracture of metatarsal bone 02/18/2012   Stroke ([place]) 1987   Right brain stroke- slight drop on left side   Syncope 01/12/2020   Type 2 diabetes mellitus with vascular disease ([place]) 05/30/2015   Wears glasses    Past Surgical History:  Procedure Laterality Date   ABDOMINAL AORTOGRAM W/LOWER EXTREMITY Bilateral 11/27/2020   Procedure: ABDOMINAL AORTOGRAM W/LOWER EXTREMITY;  Surgeon: Serafina Mitchell, MD;  Location: [facility];  Service: Cardiovascular;  Laterality: Bilateral;   CARDIAC CATHETERIZATION N/A 05/17/2015   Procedure: Left Heart Cath and Coronary Angiography;  Surgeon: Belva Crome, MD;  Location: [facility];  Service: Cardiovascular;  Laterality: N/A;   CAROTID ENDARTERECTOMY  1994 & redo 2001   Left   COLONOSCOPY W/ BIOPSIES AND POLYPECTOMY     CORONARY ARTERY BYPASS GRAFT N/A 05/23/2015   Procedure: CORONARY ARTERY BYPASS GRAFTING (CABG) x 3 (LIMA to LAD, SVG to DIAGONAL 2, SVG to PDA) with Endoscopic Vein Harvesting from right greater saphenous vein;  Surgeon: Grace Isaac, MD;  Location: [facility];  Service: Open Heart Surgery;  Laterality: N/A;   ENDARTERECTOMY FEMORAL Right 11/30/2020   Procedure: ENDARTERECTOMY FEMORAL RIGHT;  Surgeon: Rosetta Posner, MD;  Location: [facility];  Service: Vascular;  Laterality:  Right;   FEMORAL-POPLITEAL BYPASS GRAFT Right 11/30/2020   Procedure: BYPASS GRAFT FEMORAL-POPLITEAL ARTERY RIGHT;  Surgeon: Rosetta Posner, MD;  Location: [facility];  Service: Vascular;  Laterality: Right;   FRACTURE SURGERY  2013   Right   foo  t X's 2   ILIAC ARTERY STENT     LAPAROSCOPIC CHOLECYSTECTOMY  09/11/2016   POSTERIOR LUMBAR FUSION  Aug. 14, 2014   Level 1   [place] SURGERY  2014   TEE WITHOUT CARDIOVERSION N/A 05/23/2015   Procedure: TRANSESOPHAGEAL ECHOCARDIOGRAM (TEE);  Surgeon: Grace Isaac, MD;  Location: [facility];  Service: Open Heart Surgery;  Laterality: N/A;   TONSILLECTOMY     TRANSCAROTID ARTERY REVASCULARIZATION  Right 06/01/2020   Procedure: RIGHT TRANSCAROTID ARTERY REVASCULARIZATION;  Surgeon: Serafina Mitchell, MD;  Location: [facility];  Service: Vascular;  Laterality: Right;   TRIGGER FINGER RELEASE Right 07/14/2017   Procedure: RIGHT LONG FINGER TRIGGER RELEASE;  Surgeon: Milly Jakob, MD;  Location: [facility];  Service: Orthopedics;  Laterality: Right;   Patient Active Problem List   Diagnosis Date Noted   Diabetic neuropathy ([place]) 06/03/2022   PAD (peripheral artery disease) ([place]) 11/30/2020   Carotid stenosis 06/01/2020   Carotid stenosis, asymptomatic 06/01/2020   Syncope 01/12/2020   Coagulation disorder ([place]) 05/25/2019   Coronary artery disease involving coronary bypass graft of native heart with angina pectoris ([name])    PAF (paroxysmal atrial fibrillation) ([name])    Type 2 diabetes mellitus with vascular disease ([place]) 05/30/2015   OSA on CPAP 05/30/2015   Abnormal stress test 05/18/2015   Diverticulosis of colon without hemorrhage 05/17/2015   Right bundle branch block 04/30/2015   Essential hypertension 04/29/2015   Hyperlipidemia 04/29/2015   DM type 2, controlled, with complication ([place]) [id number]   Peripheral vascular disease ([place]) 06/08/2012   Stress fracture of metatarsal bone  02/18/2012   Acquired equinus deformity of foot 12/15/2011   Plantar fasciitis 12/15/2011   Stenosis of right internal carotid artery with cerebral infarction ([place]) 12/09/2011    PCP: Lona Kettle MD   REFERRING PROVIDER: Walker Shadow MD  REFERRING DIAG: [phone] (ICD-10-CM) - Other spondylosis with myelopathy, cervical region  THERAPY DIAG:  Cervicalgia  Other abnormalities of gait and mobility  Rationale for Evaluation and Treatment Rehabilitation  ONSET DATE:   SUBJECTIVE:  SUBJECTIVE STATEMENT: The patient reports its a fair balance day. He is doing well overall.   PERTINENT HISTORY:  Anemia, acquired equinus deformity; CAD< CKD, DMII, IBS, MI, MI, neuralgia, PF, carotid stenosis Stroke, Torn Rotator cuff; CABG; vein bypass graft 1998; Spine surgery in 2014     PAIN:  Are you having pain? Yes: NPRS scale: 3/10 Pain location: right hip  Pain description: aching  Aggravating factors: just comes on  Relieving factors: goes away with time   PRECAUTIONS: Fall  WEIGHT BEARING RESTRICTIONS: No  FALLS:  Has patient fallen in last 6 months? Yes. Number of falls 2 falls both reaching forward    LIVING ENVIRONMENT: Lives with wife; some steps into the house and stairs inside OCCUPATION: retried   Goes to a gym in [place]:   To have improved balance and to have less neck pain    OBJECTIVE:   DIAGNOSTIC FINDINGS:  Cervical MRI  MRI cervical spine (without) demonstrating: - At C3-4 disc bulging and uncovertebral joint hypertrophy with moderate spinal stenosis and severe bilateral foraminal stenosis; subtle myelomalacia noted within the spinal cord on the left side. - At C4-5 disc bulging and uncovertebral joint hypertrophy  with moderate to severe spinal stenosis and moderate bilateral foraminal stenosis. - At C5-6 disc bulging and facet hypertrophy with moderate spinal stenosis and severe bilateral foraminal stenosis.  Lumbar MRI: 9/10 MRI lumbar spine without contrast demonstrating: - At L5-S1: Anterior spondylolisthesis of L5 on S1 resulting in severe bilateral foraminal stenosis; posterior lumbar interbody fusion and metal hardware as above. - At L2-3: Disc bulging with mild spinal stenosis and mild bilateral foraminal stenosis. - At L4-5: Disc bulging with mild bilateral foraminal stenosis.  PATIENT SURVEYS:  FOTO    COGNITION: Overall cognitive status: Within functional limits for tasks assessed  SENSATION:   POSTURE: rounded shoulders and forward head  PALPATION: Spasming of the upper trap    CERVICAL ROM:   Active ROM A/PROM (deg) eval 12/11   Flexion     Extension  23 37  Right lateral flexion     Left lateral flexion     Right rotation 45 if he dips his neck he can get 65  55 65  Left rotation 45 60 65   (Blank rows = not tested)  UPPER EXTREMITY   MMT Right eval Left eval Right 12/11  Left 12/11  Shoulder flexion 4/5 4/5 4+/5 5/5  Shoulder extension      Shoulder abduction      Shoulder adduction      Shoulder extension      Shoulder internal rotation 4/5 4/5 5/5 5/5  Shoulder external rotation 4/5 4/5 4+/5 4+/5  Elbow flexion      Elbow extension      Wrist flexion      Wrist extension      Wrist ulnar deviation      Wrist radial deviation      Wrist pronation      Wrist supination       (Blank rows = not tested)  Grip right 40 lbs  Grip left 50 lbs    UPPER EXTREMITY MMT:  AROM  Right eval Left eval Right  left Right 2/7  Left 2/7   Shoulder flexion 140 140 160  160  160 160  Shoulder extension        Shoulder abduction        Shoulder adduction  Shoulder extension        Shoulder internal rotation        Shoulder external rotation No pain  but stiff  Pain in the left arm   No pain reaching behind his head     Middle trapezius        Lower trapezius        Elbow flexion        Elbow extension        Wrist flexion        Wrist extension        Wrist ulnar deviation        Wrist radial deviation        Wrist pronation        Wrist supination        Grip strength         (Blank rows = not tested)  LOWER EXTREMITY MMT:    MMT Right eval Left eval Right  12/11 Left  12/11  Hip flexion 25.9 25.8 34.4 34.9  Hip extension      Hip abduction 36.9 36.4 36.5 39.0  Hip adduction      Hip internal rotation      Hip external rotation      Knee flexion      Knee extension 40 37.5    Ankle dorsiflexion      Ankle plantarflexion      Ankle inversion      Ankle eversion       (Blank rows = not tested)     TODAY'S TREATMENT:                                                                                                                      DATE:  3/1 Nu-step 5 min   Leg press 3x15 50 lbs  Hip abduction 3x15 55 lbs     Airex: Narrow base of support eyes open 2 x 30 seconds Narrow base of support eyes closed 2 x 30 seconds  Step onto air-ex x20 fwd and side to side   Heel toe rock on air-ex x20  March with min UE support on the air-ex  Hurdles fwd and back 2 laps Side steps 2 laps  In and out of hurdles 4 laps    2/21 Nu-step 5 min  LAQ x20 each leg Blue  Hip abduction 2x20 Blue   Airex: Narrow base of support eyes open 2 x 30 seconds Narrow base of support eyes closed 2 x 30 seconds  Step onto air-ex x20 fwd and side to side   Heel toe rock on air-ex x20  March with min UE support on the air-ex  Hurdles fwd and back 2 laps Side steps 2 laps  In and out of hurdles 4 laps     2/13 Nu-step 5 min  LAQ x20 each leg green  Hip abduction 2x20 green   Airex: Narrow base of support eyes open 2 x 30 seconds Narrow base of support eyes  closed 2 x 30 seconds  Step onto air-ex x20 fwd and side to  side   Heel toe rock on air-ex x20  March with min UE support on the air-ex  Hurdles fwd and back 2 laps Side steps 2 laps  In and out of hurdles 4 laps       PATIENT EDUCATION:   Education details: HEP and symptom management; improving balance   Person educated: Patient Education method: Explanation, Demonstration, Tactile cues, Verbal cues, and Handouts Education comprehension: verbalized understanding, returned demonstration, verbal cues required, tactile cues required, and needs further education  HOME EXERCISE PROGRAM: Access Code: [email or web address] URL: [email or web address] Date: 06/04/2022 Prepared by: Carolyne Littles  ASSESSMENT:  CLINICAL IMPRESSION:  The patient continues to do well with his balance exercises. He had no loss of balance. We worked on gy exercises today. He tolerated well. Therapy will continue to progress as tolerated.   OBJECTIVE IMPAIRMENTS: Abnormal gait, decreased activity tolerance, decreased endurance, decreased mobility, difficulty walking, decreased ROM, decreased strength, increased fascial restrictions, increased muscle spasms, impaired UE functional use, and pain.   ACTIVITY LIMITATIONS: carrying, lifting, bending, standing, squatting, stairs, transfers, and locomotion level  PARTICIPATION LIMITATIONS: cleaning, laundry, shopping, community activity, and yard work  PERSONAL FACTORS: 3+ comorbidities: past CVA, CABG, arthritis in the back   are also affecting patient's functional outcome.   REHAB POTENTIAL: Good  CLINICAL DECISION MAKING: Evolving/moderate complexity fluctuating pain levels in his neck and declining balance   EVALUATION COMPLEXITY: Moderate   GOALS: Goals reviewed with patient? Yes  SHORT TERM GOALS: Target date: 10/16/2022    Patient will increase cervical rotation by 10 degrees  Baseline: Achieved on left not achieved on the right Goal status: Achieved 2/7  2.  Patient will report a 50% reduction in  cervical headache  Baseline:  Goal status: Achieved 1 achieved 2/7  3.  Patient will required CGA for narrow base with eyes open.  Baseline:  Goal status: Achieved progressing 2/7 still requires min assist  4.  Patient will increase cervical extension by 10 degrees without pain Goal status: New goal achieved 2/7  LONG TERM GOALS: Target date:10/30/2022    Patient will demonstrate improved balance with all balance tasks to improve safety  Baseline:  Goal status: Improved balance but still high fall risk 12/11  2.  Patient will demonstrate >65 degrees of cervical rotation without pain in order to improve community safety  Baseline:  Goal status: 65 on left 12/11 achieved 2/7  3.  Patient will be independent with a complete HEP  Baseline:  Goal status: Independent with basic exercise program will continue to expand 12/11 we will continue to expand upon balance exercises   PLAN:  PT FREQUENCY: 1 time a week  PT DURATION: 6-week  PLANNED INTERVENTIONS: Therapeutic exercises, Therapeutic activity, Neuromuscular re-education, Balance training, Gait training, Patient/Family education, Self Care, Joint mobilization, DME instructions, Aquatic Therapy, Dry Needling, Electrical stimulation, Cryotherapy, Moist heat, Taping, Ultrasound, and Manual therapy  PLAN FOR NEXT SESSION: see MRI of the neck. Keep all movements gentle and without forcing movement. Work on balance exercises. Give patient a general LE strengthening exercise.  Continue with manual therapy for the neck, continue with functional strengthening continue with balance training   Carney Living, PT 10/10/2022, 3:46 PM

## 2022-10-14 ENCOUNTER — Ambulatory Visit (HOSPITAL_BASED_OUTPATIENT_CLINIC_OR_DEPARTMENT_OTHER): Payer: PPO | Admitting: Physical Therapy

## 2022-10-14 ENCOUNTER — Encounter (HOSPITAL_BASED_OUTPATIENT_CLINIC_OR_DEPARTMENT_OTHER): Payer: Self-pay | Admitting: Physical Therapy

## 2022-10-14 DIAGNOSIS — M5412 Radiculopathy, cervical region: Secondary | ICD-10-CM | POA: Diagnosis not present

## 2022-10-14 DIAGNOSIS — M542 Cervicalgia: Secondary | ICD-10-CM

## 2022-10-14 DIAGNOSIS — R2689 Other abnormalities of gait and mobility: Secondary | ICD-10-CM

## 2022-10-14 DIAGNOSIS — M5033 Other cervical disc degeneration, cervicothoracic region: Secondary | ICD-10-CM | POA: Diagnosis not present

## 2022-10-14 DIAGNOSIS — M9903 Segmental and somatic dysfunction of lumbar region: Secondary | ICD-10-CM | POA: Diagnosis not present

## 2022-10-14 DIAGNOSIS — M9901 Segmental and somatic dysfunction of cervical region: Secondary | ICD-10-CM | POA: Diagnosis not present

## 2022-10-14 NOTE — Therapy (Signed)
OUTPATIENT PHYSICAL THERAPY CERVICAL Treatment Patient Name: Bradley [name] MRN: [id number] DOB:1946-06-27, 77 y.o., male Today's Date: 10/15/2022      PT End of Session - 10/14/22 1525     Visit Number 12    Number of Visits 18    Date for PT Re-Evaluation 11/25/22    Authorization Type progress note done on visit 12 next to be done on 22    PT Start Time 1515    PT Stop Time 1558    PT Time Calculation (min) 43 min    Activity Tolerance Patient tolerated treatment well    Behavior During Therapy [facility] for tasks assessed/performed               Past Medical History:  Diagnosis Date   Acquired equinus deformity of foot 12/15/2011   Anemia    Arthritis    Carotid artery occlusion    a. Carotid US 10/16: RICA 60-79%, L CEA patent with 1-39% stenosis   CKD (chronic kidney disease)    stage 3-4   Coagulation disorder ([place]) 05/25/2019   Coronary artery disease    a. Myoview 9/16:  EF 52%, inferior fixed defect consistent with diaphragmatic attenuation, intermediate risk secondary to poor exercise tolerance and symptoms during stress;  b. LHC 05/17/2015 90% mid LAD, 80% ost D2, 75% mid RCA, 35% prox RCA. >> S/p CABG   Coronary artery disease involving coronary bypass graft of native heart with angina pectoris ([place])    CORONARY ARTERY BYPASS GRAFTING x 3 - 05/23/2015 Left internal mammary artery to left anterior descending  Saphenous vein graft to diagonal  Saphenous vein graft to posterior descending Endoscopic greater saphenous vein harvest right thigh     Diabetes mellitus age 28   Diverticulitis    Diverticulosis of colon without hemorrhage 05/17/2015   By CT scan    DM type 2, controlled, with complication ([place]) [id number]   Essential hypertension 04/29/2015   GERD (gastroesophageal reflux disease)    H/O hiatal hernia    History of echocardiogram    a. Echo 9/16: GLS -15.2%, EF 0000000, grade 1 diastolic dysfunction, normal wall motion, aortic sclerosis, dilated aortic  root 39 mm, atrial septal lipomatous hypertrophy   Hyperlipidemia    Hypertension    Irritable bowel syndrome (IBS)    Myocardial infarction ([place])    silent inferior MI; patient denies MI history (03/17/13)    Neuralgia    Neuropathy 2013   OSA on CPAP 05/30/2015   Osteoporosis 2013   PAF (paroxysmal atrial fibrillation) ([place])    post CABG 2016; Amiodarone stopped 2/2 wheezing; PAF 01/12/20   Peripheral vascular disease ([place])    Plantar fasciitis 12/15/2011   Reflux esophagitis    Right bundle branch block 04/30/2015   Sleep apnea    uses CPAP   Stenosis of right internal carotid artery with cerebral infarction ([place]) 12/09/2011   Stress fracture of metatarsal bone 02/18/2012   Stroke ([place]) 1987   Right brain stroke- slight drop on left side   Syncope 01/12/2020   Type 2 diabetes mellitus with vascular disease ([place]) 05/30/2015   Wears glasses    Past Surgical History:  Procedure Laterality Date   ABDOMINAL AORTOGRAM W/LOWER EXTREMITY Bilateral 11/27/2020   Procedure: ABDOMINAL AORTOGRAM W/LOWER EXTREMITY;  Surgeon: Serafina Mitchell, MD;  Location: [place] [facility];  Service: Cardiovascular;  Laterality: Bilateral;   CARDIAC CATHETERIZATION N/A 05/17/2015   Procedure: Left Heart Cath and Coronary Angiography;  Surgeon: Belva Crome, MD;  Location: [place] [facility];  Service: Cardiovascular;  Laterality: N/A;   CAROTID ENDARTERECTOMY  1994 & redo 2001   Left   COLONOSCOPY W/ BIOPSIES AND POLYPECTOMY     CORONARY ARTERY BYPASS GRAFT N/A 05/23/2015   Procedure: CORONARY ARTERY BYPASS GRAFTING (CABG) x 3 (LIMA to LAD, SVG to DIAGONAL 2, SVG to PDA) with Endoscopic Vein Harvesting from right greater saphenous vein;  Surgeon: Grace Isaac, MD;  Location: [facility];  Service: Open Heart Surgery;  Laterality: N/A;   ENDARTERECTOMY FEMORAL Right 11/30/2020   Procedure: ENDARTERECTOMY FEMORAL RIGHT;  Surgeon: Rosetta Posner, MD;  Location: [place];  Service: Vascular;  Laterality: Right;    FEMORAL-POPLITEAL BYPASS GRAFT Right 11/30/2020   Procedure: BYPASS GRAFT FEMORAL-POPLITEAL ARTERY RIGHT;  Surgeon: Rosetta Posner, MD;  Location: [place];  Service: Vascular;  Laterality: Right;   FRACTURE SURGERY  2013   Right   foo  t X's 2   ILIAC ARTERY STENT     LAPAROSCOPIC CHOLECYSTECTOMY  09/11/2016   POSTERIOR LUMBAR FUSION  Aug. 14, 2014   Level 1   [place] SURGERY  2014   TEE WITHOUT CARDIOVERSION N/A 05/23/2015   Procedure: TRANSESOPHAGEAL ECHOCARDIOGRAM (TEE);  Surgeon: Grace Isaac, MD;  Location: [place];  Service: Open Heart Surgery;  Laterality: N/A;   TONSILLECTOMY     TRANSCAROTID ARTERY REVASCULARIZATION  Right 06/01/2020   Procedure: RIGHT TRANSCAROTID ARTERY REVASCULARIZATION;  Surgeon: Serafina Mitchell, MD;  Location: [facility];  Service: Vascular;  Laterality: Right;   TRIGGER FINGER RELEASE Right 07/14/2017   Procedure: RIGHT LONG FINGER TRIGGER RELEASE;  Surgeon: Milly Jakob, MD;  Location: [facility];  Service: Orthopedics;  Laterality: Right;   Patient Active Problem List   Diagnosis Date Noted   Diabetic neuropathy ([place]) 06/03/2022   PAD (peripheral artery disease) ([place]) 11/30/2020   Carotid stenosis 06/01/2020   Carotid stenosis, asymptomatic 06/01/2020   Syncope 01/12/2020   Coagulation disorder ([place]) 05/25/2019   Coronary artery disease involving coronary bypass graft of native heart with angina pectoris ([name])    PAF (paroxysmal atrial fibrillation) ([name])    Type 2 diabetes mellitus with vascular disease ([place]) 05/30/2015   OSA on CPAP 05/30/2015   Abnormal stress test 05/18/2015   Diverticulosis of colon without hemorrhage 05/17/2015   Right bundle branch block 04/30/2015   Essential hypertension 04/29/2015   Hyperlipidemia 04/29/2015   DM type 2, controlled, with complication ([place]) [id number]   Peripheral vascular disease ([place]) 06/08/2012   Stress fracture of metatarsal bone 02/18/2012    Acquired equinus deformity of foot 12/15/2011   Plantar fasciitis 12/15/2011   Stenosis of right internal carotid artery with cerebral infarction ([place]) 12/09/2011    PCP: Lona Kettle MD   REFERRING PROVIDER: Walker Shadow MD  REFERRING DIAG: [phone] (ICD-10-CM) - Other spondylosis with myelopathy, cervical region  THERAPY DIAG:  Cervicalgia  Other abnormalities of gait and mobility  Rationale for Evaluation and Treatment Rehabilitation  ONSET DATE:   SUBJECTIVE:  SUBJECTIVE STATEMENT: The patient reports its a fair balance day. He  was doing well over the weekend.   PERTINENT HISTORY:  Anemia, acquired equinus deformity; CAD< CKD, DMII, IBS, MI, MI, neuralgia, PF, carotid stenosis Stroke, Torn Rotator cuff; CABG; vein bypass graft 1998; Spine surgery in 2014     PAIN:  Are you having pain? Yes: NPRS scale: 3/10 Pain location: right hip  Pain description: aching  Aggravating factors: just comes on  Relieving factors: goes away with time   PRECAUTIONS: Fall  WEIGHT BEARING RESTRICTIONS: No  FALLS:  Has patient fallen in last 6 months? Yes. Number of falls 2 falls both reaching forward    LIVING ENVIRONMENT: Lives with wife; some steps into the house and stairs inside OCCUPATION: retried   Goes to a gym in [place]:   To have improved balance and to have less neck pain    OBJECTIVE:   DIAGNOSTIC FINDINGS:  Cervical MRI  MRI cervical spine (without) demonstrating: - At C3-4 disc bulging and uncovertebral joint hypertrophy with moderate spinal stenosis and severe bilateral foraminal stenosis; subtle myelomalacia noted within the spinal cord on the left side. - At C4-5 disc bulging and uncovertebral joint hypertrophy with  moderate to severe spinal stenosis and moderate bilateral foraminal stenosis. - At C5-6 disc bulging and facet hypertrophy with moderate spinal stenosis and severe bilateral foraminal stenosis.  Lumbar MRI: 9/10 MRI lumbar spine without contrast demonstrating: - At L5-S1: Anterior spondylolisthesis of L5 on S1 resulting in severe bilateral foraminal stenosis; posterior lumbar interbody fusion and metal hardware as above. - At L2-3: Disc bulging with mild spinal stenosis and mild bilateral foraminal stenosis. - At L4-5: Disc bulging with mild bilateral foraminal stenosis.  PATIENT SURVEYS:  FOTO    COGNITION: Overall cognitive status: Within functional limits for tasks assessed  SENSATION:   POSTURE: rounded shoulders and forward head  PALPATION: Spasming of the upper trap    CERVICAL ROM:   Active ROM A/PROM (deg) eval 12/11    Flexion    25  Extension  23 37 36  Right lateral flexion      Left lateral flexion      Right rotation 45 if he dips his neck he can get 65  55 65 67  Left rotation 45 60 65 67   (Blank rows = not tested)  UPPER EXTREMITY   MMT Right eval Left eval Right 12/11  Left 12/11  Shoulder flexion 4/5 4/5 4+/5 5/5  Shoulder extension      Shoulder abduction      Shoulder adduction      Shoulder extension      Shoulder internal rotation 4/5 4/5 5/5 5/5  Shoulder external rotation 4/5 4/5 4+/5 4+/5  Elbow flexion      Elbow extension      Wrist flexion      Wrist extension      Wrist ulnar deviation      Wrist radial deviation      Wrist pronation      Wrist supination       (Blank rows = not tested)  Grip right 40 lbs  Grip left 50 lbs    UPPER EXTREMITY MMT:  AROM  Right eval Left eval Right  left Right 2/7  Left 2/7   Shoulder flexion 140 140 160  160  160 160  Shoulder extension        Shoulder abduction  Shoulder adduction        Shoulder extension        Shoulder internal rotation        Shoulder external rotation  No pain but stiff  Pain in the left arm   No pain reaching behind his head     Middle trapezius        Lower trapezius        Elbow flexion        Elbow extension        Wrist flexion        Wrist extension        Wrist ulnar deviation        Wrist radial deviation        Wrist pronation        Wrist supination        Grip strength         (Blank rows = not tested)  LOWER EXTREMITY MMT:    MMT Right eval Left eval Right  12/11 Left  12/11 Right  3/5 Left 3/5  Hip flexion 25.9 25.8 34.4 34.9 38.4 36.5  Hip extension        Hip abduction 36.9 36.4 36.5 39.0 40.2 44.2  Hip adduction        Hip internal rotation        Hip external rotation        Knee flexion        Knee extension 40 37.5   40.7 43.7  Ankle dorsiflexion        Ankle plantarflexion        Ankle inversion        Ankle eversion         (Blank rows = not tested)   Blanace: narrow base SBA  NB EC CGA   Tandem stance SBA  Tandem stance EC min a   TODAY'S TREATMENT:                                                                                                                      DATE:  3/5 Tricpes press down 3x15 20 lbs Row machine 3x15 20lbs  Cable pull down 3x15 10 lbs   Narrow base of support eyes open 2 x 30 seconds Narrow base of support eyes closed 2 x 30 seconds  Step onto air-ex x20 fwd and side to side   Heel toe rock on air-ex x20  March with min UE support on the air-ex   3/1 Nu-step 5 min   Leg press 3x15 50 lbs  Hip abduction 3x15 55 lbs     Airex: Narrow base of support eyes open 2 x 30 seconds Narrow base of support eyes closed 2 x 30 seconds  Step onto air-ex x20 fwd and side to side   Heel toe rock on air-ex x20  March with min UE support on the air-ex  Hurdles fwd and back 2 laps Side steps 2 laps  In and out of hurdles  4 laps      2/21 Nu-step 5 min  LAQ x20 each leg Blue  Hip abduction 2x20 Blue   Airex: Narrow base of support eyes open 2 x 30  seconds Narrow base of support eyes closed 2 x 30 seconds  Step onto air-ex x20 fwd and side to side   Heel toe rock on air-ex x20  March with min UE support on the air-ex  Hurdles fwd and back 2 laps Side steps 2 laps  In and out of hurdles 4 laps     2/13 Nu-step 5 min  LAQ x20 each leg green  Hip abduction 2x20 green   Airex: Narrow base of support eyes open 2 x 30 seconds Narrow base of support eyes closed 2 x 30 seconds  Step onto air-ex x20 fwd and side to side   Heel toe rock on air-ex x20  March with min UE support on the air-ex  Hurdles fwd and back 2 laps Side steps 2 laps  In and out of hurdles 4 laps       PATIENT EDUCATION:   Education details: HEP and symptom management; improving balance   Person educated: Patient Education method: Explanation, Demonstration, Tactile cues, Verbal cues, and Handouts Education comprehension: verbalized understanding, returned demonstration, verbal cues required, tactile cues required, and needs further education  HOME EXERCISE PROGRAM: Access Code: [email or web address] URL: [email or web address] Date: 06/04/2022 Prepared by: Carolyne Littles  ASSESSMENT:  CLINICAL IMPRESSION: The patient came in today feeling off balanced compared to the last fw visits. Overall it does appear his balance is improving. Overall his neck motion has improved, as well as functional use of both arms. We continue to work on getting him back into [employer] and to finalize his balance program for home. He would benefit from further skilled therapy 1W6. We assessed the patients B/P today to see if his off feeling had anything to do with that. His B/P was very good at 126/66. He reports he just has these days where his balance feels off.    OBJECTIVE IMPAIRMENTS: Abnormal gait, decreased activity tolerance, decreased endurance, decreased mobility, difficulty walking, decreased ROM, decreased strength, increased fascial restrictions,  increased muscle spasms, impaired UE functional use, and pain.   ACTIVITY LIMITATIONS: carrying, lifting, bending, standing, squatting, stairs, transfers, and locomotion level  PARTICIPATION LIMITATIONS: cleaning, laundry, shopping, community activity, and yard work  PERSONAL FACTORS: 3+ comorbidities: past CVA, CABG, arthritis in the back   are also affecting patient's functional outcome.   REHAB POTENTIAL: Good  CLINICAL DECISION MAKING: Evolving/moderate complexity fluctuating pain levels in his neck and declining balance   EVALUATION COMPLEXITY: Moderate   GOALS: Goals reviewed with patient? Yes  SHORT TERM GOALS: Target date: 10/16/2022    Patient will increase cervical rotation by 10 degrees  Baseline: Achieved on left not achieved on the right Goal status: Achieved 2/7  2.  Patient will report a 50% reduction in cervical headache  Baseline:  Goal status: Achieved 1 achieved 2/7  3.  Patient will required CGA for narrow base with eyes open.  Baseline:  Goal status: Achieved progressing 2/7 still requires min assist  4.  Patient will increase cervical extension by 10 degrees without pain Goal status: New goal achieved 2/7  LONG TERM GOALS: Target date:10/30/2022    Patient will demonstrate improved balance with all balance tasks to improve safety  Baseline:  Goal status: Improved balance but still high fall risk 12/11  2.  Patient will demonstrate >65 degrees  of cervical rotation without pain in order to improve community safety  Baseline:  Goal status: 65 on left 12/11 achieved 2/7  3.  Patient will be independent with a complete HEP  Baseline:  Goal status: Independent with basic exercise program will continue to expand 12/11 we will continue to expand upon balance exercises   PLAN:  PT FREQUENCY: 1 time a week  PT DURATION: 6-week  PLANNED INTERVENTIONS: Therapeutic exercises, Therapeutic activity, Neuromuscular re-education, Balance training, Gait  training, Patient/Family education, Self Care, Joint mobilization, DME instructions, Aquatic Therapy, Dry Needling, Electrical stimulation, Cryotherapy, Moist heat, Taping, Ultrasound, and Manual therapy  PLAN FOR NEXT SESSION: see MRI of the neck. Keep all movements gentle and without forcing movement. Work on balance exercises. Give patient a general LE strengthening exercise.  Continue with manual therapy for the neck, continue with functional strengthening continue with balance training   Carney Living, PT 10/15/2022, 12:53 PM

## 2022-10-15 ENCOUNTER — Encounter (HOSPITAL_BASED_OUTPATIENT_CLINIC_OR_DEPARTMENT_OTHER): Payer: Self-pay | Admitting: Physical Therapy

## 2022-10-15 DIAGNOSIS — E1142 Type 2 diabetes mellitus with diabetic polyneuropathy: Secondary | ICD-10-CM | POA: Diagnosis not present

## 2022-10-20 ENCOUNTER — Ambulatory Visit (HOSPITAL_BASED_OUTPATIENT_CLINIC_OR_DEPARTMENT_OTHER): Payer: PPO | Admitting: Physical Therapy

## 2022-10-20 DIAGNOSIS — M542 Cervicalgia: Secondary | ICD-10-CM | POA: Diagnosis not present

## 2022-10-20 DIAGNOSIS — R2689 Other abnormalities of gait and mobility: Secondary | ICD-10-CM

## 2022-10-20 NOTE — Therapy (Signed)
OUTPATIENT PHYSICAL THERAPY CERVICAL Treatment Patient Name: Bradley [name] MRN: [id number] DOB:1946-06-27, 77 y.o., male Today's Date: 10/15/2022      PT End of Session - 10/14/22 1525     Visit Number 12    Number of Visits 18    Date for PT Re-Evaluation 11/25/22    Authorization Type progress note done on visit 12 next to be done on 22    PT Start Time 1515    PT Stop Time 1558    PT Time Calculation (min) 43 min    Activity Tolerance Patient tolerated treatment well    Behavior During Therapy [facility] for tasks assessed/performed               Past Medical History:  Diagnosis Date   Acquired equinus deformity of foot 12/15/2011   Anemia    Arthritis    Carotid artery occlusion    a. Carotid US 10/16: RICA 60-79%, L CEA patent with 1-39% stenosis   CKD (chronic kidney disease)    stage 3-4   Coagulation disorder ([place]) 05/25/2019   Coronary artery disease    a. Myoview 9/16:  EF 52%, inferior fixed defect consistent with diaphragmatic attenuation, intermediate risk secondary to poor exercise tolerance and symptoms during stress;  b. LHC 05/17/2015 90% mid LAD, 80% ost D2, 75% mid RCA, 35% prox RCA. >> S/p CABG   Coronary artery disease involving coronary bypass graft of native heart with angina pectoris ([place])    CORONARY ARTERY BYPASS GRAFTING x 3 - 05/23/2015 Left internal mammary artery to left anterior descending  Saphenous vein graft to diagonal  Saphenous vein graft to posterior descending Endoscopic greater saphenous vein harvest right thigh     Diabetes mellitus age 28   Diverticulitis    Diverticulosis of colon without hemorrhage 05/17/2015   By CT scan    DM type 2, controlled, with complication ([place]) [id number]   Essential hypertension 04/29/2015   GERD (gastroesophageal reflux disease)    H/O hiatal hernia    History of echocardiogram    a. Echo 9/16: GLS -15.2%, EF 0000000, grade 1 diastolic dysfunction, normal wall motion, aortic sclerosis, dilated aortic  root 39 mm, atrial septal lipomatous hypertrophy   Hyperlipidemia    Hypertension    Irritable bowel syndrome (IBS)    Myocardial infarction ([place])    silent inferior MI; patient denies MI history (03/17/13)    Neuralgia    Neuropathy 2013   OSA on CPAP 05/30/2015   Osteoporosis 2013   PAF (paroxysmal atrial fibrillation) ([place])    post CABG 2016; Amiodarone stopped 2/2 wheezing; PAF 01/12/20   Peripheral vascular disease ([place])    Plantar fasciitis 12/15/2011   Reflux esophagitis    Right bundle branch block 04/30/2015   Sleep apnea    uses CPAP   Stenosis of right internal carotid artery with cerebral infarction ([place]) 12/09/2011   Stress fracture of metatarsal bone 02/18/2012   Stroke ([place]) 1987   Right brain stroke- slight drop on left side   Syncope 01/12/2020   Type 2 diabetes mellitus with vascular disease ([place]) 05/30/2015   Wears glasses    Past Surgical History:  Procedure Laterality Date   ABDOMINAL AORTOGRAM W/LOWER EXTREMITY Bilateral 11/27/2020   Procedure: ABDOMINAL AORTOGRAM W/LOWER EXTREMITY;  Surgeon: Serafina Mitchell, MD;  Location: [place] [facility];  Service: Cardiovascular;  Laterality: Bilateral;   CARDIAC CATHETERIZATION N/A 05/17/2015   Procedure: Left Heart Cath and Coronary Angiography;  Surgeon: Belva Crome, MD;  Location: [place] [facility];  Service: Cardiovascular;  Laterality: N/A;   CAROTID ENDARTERECTOMY  1994 & redo 2001   Left   COLONOSCOPY W/ BIOPSIES AND POLYPECTOMY     CORONARY ARTERY BYPASS GRAFT N/A 05/23/2015   Procedure: CORONARY ARTERY BYPASS GRAFTING (CABG) x 3 (LIMA to LAD, SVG to DIAGONAL 2, SVG to PDA) with Endoscopic Vein Harvesting from right greater saphenous vein;  Surgeon: Grace Isaac, MD;  Location: [place];  Service: Open Heart Surgery;  Laterality: N/A;   ENDARTERECTOMY FEMORAL Right 11/30/2020   Procedure: ENDARTERECTOMY FEMORAL RIGHT;  Surgeon: Rosetta Posner, MD;  Location: [place];  Service: Vascular;  Laterality: Right;    FEMORAL-POPLITEAL BYPASS GRAFT Right 11/30/2020   Procedure: BYPASS GRAFT FEMORAL-POPLITEAL ARTERY RIGHT;  Surgeon: Rosetta Posner, MD;  Location: [place];  Service: Vascular;  Laterality: Right;   FRACTURE SURGERY  2013   Right   foo  t X's 2   ILIAC ARTERY STENT     LAPAROSCOPIC CHOLECYSTECTOMY  09/11/2016   POSTERIOR LUMBAR FUSION  Aug. 14, 2014   Level 1   [place] SURGERY  2014   TEE WITHOUT CARDIOVERSION N/A 05/23/2015   Procedure: TRANSESOPHAGEAL ECHOCARDIOGRAM (TEE);  Surgeon: Grace Isaac, MD;  Location: [place];  Service: Open Heart Surgery;  Laterality: N/A;   TONSILLECTOMY     TRANSCAROTID ARTERY REVASCULARIZATION  Right 06/01/2020   Procedure: RIGHT TRANSCAROTID ARTERY REVASCULARIZATION;  Surgeon: Serafina Mitchell, MD;  Location: [facility];  Service: Vascular;  Laterality: Right;   TRIGGER FINGER RELEASE Right 07/14/2017   Procedure: RIGHT LONG FINGER TRIGGER RELEASE;  Surgeon: Milly Jakob, MD;  Location: [place];  Service: Orthopedics;  Laterality: Right;   Patient Active Problem List   Diagnosis Date Noted   Diabetic neuropathy ([place]) 06/03/2022   PAD (peripheral artery disease) ([place]) 11/30/2020   Carotid stenosis 06/01/2020   Carotid stenosis, asymptomatic 06/01/2020   Syncope 01/12/2020   Coagulation disorder ([place]) 05/25/2019   Coronary artery disease involving coronary bypass graft of native heart with angina pectoris ([name])    PAF (paroxysmal atrial fibrillation) ([name])    Type 2 diabetes mellitus with vascular disease ([place]) 05/30/2015   OSA on CPAP 05/30/2015   Abnormal stress test 05/18/2015   Diverticulosis of colon without hemorrhage 05/17/2015   Right bundle branch block 04/30/2015   Essential hypertension 04/29/2015   Hyperlipidemia 04/29/2015   DM type 2, controlled, with complication ([place]) [id number]   Peripheral vascular disease ([place]) 06/08/2012   Stress fracture of metatarsal bone 02/18/2012    Acquired equinus deformity of foot 12/15/2011   Plantar fasciitis 12/15/2011   Stenosis of right internal carotid artery with cerebral infarction ([place]) 12/09/2011    PCP: Lona Kettle MD   REFERRING PROVIDER: Walker Shadow MD  REFERRING DIAG: [phone] (ICD-10-CM) - Other spondylosis with myelopathy, cervical region  THERAPY DIAG:  Cervicalgia  Other abnormalities of gait and mobility  Rationale for Evaluation and Treatment Rehabilitation  ONSET DATE:   SUBJECTIVE:  SUBJECTIVE STATEMENT: The patient reports its a fair balance day. He  was doing well over the weekend.   PERTINENT HISTORY:  Anemia, acquired equinus deformity; CAD< CKD, DMII, IBS, MI, MI, neuralgia, PF, carotid stenosis Stroke, Torn Rotator cuff; CABG; vein bypass graft 1998; Spine surgery in 2014     PAIN:  Are you having pain? Yes: NPRS scale: 3/10 Pain location: right hip  Pain description: aching  Aggravating factors: just comes on  Relieving factors: goes away with time   PRECAUTIONS: Fall  WEIGHT BEARING RESTRICTIONS: No  FALLS:  Has patient fallen in last 6 months? Yes. Number of falls 2 falls both reaching forward    LIVING ENVIRONMENT: Lives with wife; some steps into the house and stairs inside OCCUPATION: retried   Goes to a gym in [place]:   To have improved balance and to have less neck pain    OBJECTIVE:   DIAGNOSTIC FINDINGS:  Cervical MRI  MRI cervical spine (without) demonstrating: - At C3-4 disc bulging and uncovertebral joint hypertrophy with moderate spinal stenosis and severe bilateral foraminal stenosis; subtle myelomalacia noted within the spinal cord on the left side. - At C4-5 disc bulging and uncovertebral joint hypertrophy with  moderate to severe spinal stenosis and moderate bilateral foraminal stenosis. - At C5-6 disc bulging and facet hypertrophy with moderate spinal stenosis and severe bilateral foraminal stenosis.  Lumbar MRI: 9/10 MRI lumbar spine without contrast demonstrating: - At L5-S1: Anterior spondylolisthesis of L5 on S1 resulting in severe bilateral foraminal stenosis; posterior lumbar interbody fusion and metal hardware as above. - At L2-3: Disc bulging with mild spinal stenosis and mild bilateral foraminal stenosis. - At L4-5: Disc bulging with mild bilateral foraminal stenosis.  PATIENT SURVEYS:  FOTO    COGNITION: Overall cognitive status: Within functional limits for tasks assessed  SENSATION:   POSTURE: rounded shoulders and forward head  PALPATION: Spasming of the upper trap    CERVICAL ROM:   Active ROM A/PROM (deg) eval 12/11    Flexion    25  Extension  23 37 36  Right lateral flexion      Left lateral flexion      Right rotation 45 if he dips his neck he can get 65  55 65 67  Left rotation 45 60 65 67   (Blank rows = not tested)  UPPER EXTREMITY   MMT Right eval Left eval Right 12/11  Left 12/11  Shoulder flexion 4/5 4/5 4+/5 5/5  Shoulder extension      Shoulder abduction      Shoulder adduction      Shoulder extension      Shoulder internal rotation 4/5 4/5 5/5 5/5  Shoulder external rotation 4/5 4/5 4+/5 4+/5  Elbow flexion      Elbow extension      Wrist flexion      Wrist extension      Wrist ulnar deviation      Wrist radial deviation      Wrist pronation      Wrist supination       (Blank rows = not tested)  Grip right 40 lbs  Grip left 50 lbs    UPPER EXTREMITY MMT:  AROM  Right eval Left eval Right  left Right 2/7  Left 2/7   Shoulder flexion 140 140 160  160  160 160  Shoulder extension        Shoulder abduction  Shoulder adduction        Shoulder extension        Shoulder internal rotation        Shoulder external rotation  No pain but stiff  Pain in the left arm   No pain reaching behind his head     Middle trapezius        Lower trapezius        Elbow flexion        Elbow extension        Wrist flexion        Wrist extension        Wrist ulnar deviation        Wrist radial deviation        Wrist pronation        Wrist supination        Grip strength         (Blank rows = not tested)  LOWER EXTREMITY MMT:    MMT Right eval Left eval Right  12/11 Left  12/11 Right  3/5 Left 3/5  Hip flexion 25.9 25.8 34.4 34.9 38.4 36.5  Hip extension        Hip abduction 36.9 36.4 36.5 39.0 40.2 44.2  Hip adduction        Hip internal rotation        Hip external rotation        Knee flexion        Knee extension 40 37.5   40.7 43.7  Ankle dorsiflexion        Ankle plantarflexion        Ankle inversion        Ankle eversion         (Blank rows = not tested)   Blanace: narrow base SBA  NB EC CGA   Tandem stance SBA  Tandem stance EC min a   TODAY'S TREATMENT:                                                                                                                      DATE:  3/5 Tricpes press down 3x15 20 lbs Row machine 3x15 20lbs  Cable pull down 3x15 10 lbs   Narrow base of support eyes open 2 x 30 seconds Narrow base of support eyes closed 2 x 30 seconds  Step onto air-ex x20 fwd and side to side   Heel toe rock on air-ex x20  March with min UE support on the air-ex   3/1 Nu-step 5 min   Leg press 3x15 50 lbs  Hip abduction 3x15 55 lbs     Airex: Narrow base of support eyes open 2 x 30 seconds Narrow base of support eyes closed 2 x 30 seconds  Step onto air-ex x20 fwd and side to side   Heel toe rock on air-ex x20  March with min UE support on the air-ex  Hurdles fwd and back 2 laps Side steps 2 laps  In and out of hurdles  4 laps      2/21 Nu-step 5 min  LAQ x20 each leg Blue  Hip abduction 2x20 Blue   Airex: Narrow base of support eyes open 2 x 30  seconds Narrow base of support eyes closed 2 x 30 seconds  Step onto air-ex x20 fwd and side to side   Heel toe rock on air-ex x20  March with min UE support on the air-ex  Hurdles fwd and back 2 laps Side steps 2 laps  In and out of hurdles 4 laps     2/13 Nu-step 5 min  LAQ x20 each leg green  Hip abduction 2x20 green   Airex: Narrow base of support eyes open 2 x 30 seconds Narrow base of support eyes closed 2 x 30 seconds  Step onto air-ex x20 fwd and side to side   Heel toe rock on air-ex x20  March with min UE support on the air-ex  Hurdles fwd and back 2 laps Side steps 2 laps  In and out of hurdles 4 laps       PATIENT EDUCATION:   Education details: HEP and symptom management; improving balance   Person educated: Patient Education method: Explanation, Demonstration, Tactile cues, Verbal cues, and Handouts Education comprehension: verbalized understanding, returned demonstration, verbal cues required, tactile cues required, and needs further education  HOME EXERCISE PROGRAM: Access Code: [email or web address] URL: [email or web address] Date: 06/04/2022 Prepared by: Carolyne Littles  ASSESSMENT:  CLINICAL IMPRESSION: Therapy focused more on lower back stretching and strengthening.  OBJECTIVE IMPAIRMENTS: Abnormal gait, decreased activity tolerance, decreased endurance, decreased mobility, difficulty walking, decreased ROM, decreased strength, increased fascial restrictions, increased muscle spasms, impaired UE functional use, and pain.   ACTIVITY LIMITATIONS: carrying, lifting, bending, standing, squatting, stairs, transfers, and locomotion level  PARTICIPATION LIMITATIONS: cleaning, laundry, shopping, community activity, and yard work  PERSONAL FACTORS: 3+ comorbidities: past CVA, CABG, arthritis in the back   are also affecting patient's functional outcome.   REHAB POTENTIAL: Good  CLINICAL DECISION MAKING: Evolving/moderate complexity fluctuating  pain levels in his neck and declining balance   EVALUATION COMPLEXITY: Moderate   GOALS: Goals reviewed with patient? Yes  SHORT TERM GOALS: Target date: 10/16/2022    Patient will increase cervical rotation by 10 degrees  Baseline: Achieved on left not achieved on the right Goal status: Achieved 2/7  2.  Patient will report a 50% reduction in cervical headache  Baseline:  Goal status: Achieved 1 achieved 2/7  3.  Patient will required CGA for narrow base with eyes open.  Baseline:  Goal status: Achieved progressing 2/7 still requires min assist  4.  Patient will increase cervical extension by 10 degrees without pain Goal status: New goal achieved 2/7  LONG TERM GOALS: Target date:10/30/2022    Patient will demonstrate improved balance with all balance tasks to improve safety  Baseline:  Goal status: Improved balance but still high fall risk 12/11  2.  Patient will demonstrate >65 degrees of cervical rotation without pain in order to improve community safety  Baseline:  Goal status: 65 on left 12/11 achieved 2/7  3.  Patient will be independent with a complete HEP  Baseline:  Goal status: Independent with basic exercise program will continue to expand 12/11 we will continue to expand upon balance exercises   PLAN:  PT FREQUENCY: 1 time a week  PT DURATION: 6-week  PLANNED INTERVENTIONS: Therapeutic exercises, Therapeutic activity, Neuromuscular re-education, Balance training, Gait training, Patient/Family education, Self Care, Joint mobilization, DME instructions, Aquatic Therapy, Dry  Needling, Electrical stimulation, Cryotherapy, Moist heat, Taping, Ultrasound, and Manual therapy  PLAN FOR NEXT SESSION: see MRI of the neck. Keep all movements gentle and without forcing movement. Work on balance exercises. Give patient a general LE strengthening exercise.  Continue with manual therapy for the neck, continue with functional strengthening continue with balance  training   Carney Living, PT 10/15/2022, 12:53 PM

## 2022-10-21 ENCOUNTER — Encounter: Payer: Self-pay | Admitting: Vascular Surgery

## 2022-10-21 ENCOUNTER — Ambulatory Visit (HOSPITAL_COMMUNITY)
Admission: RE | Admit: 2022-10-21 | Discharge: 2022-10-21 | Disposition: A | Discharge status: Home / Self Care | Payer: PPO | Source: Ambulatory Visit | Attending: Vascular Surgery | Admitting: Vascular Surgery

## 2022-10-21 ENCOUNTER — Ambulatory Visit (INDEPENDENT_AMBULATORY_CARE_PROVIDER_SITE_OTHER): Payer: PPO | Admitting: Vascular Surgery

## 2022-10-21 ENCOUNTER — Encounter (HOSPITAL_BASED_OUTPATIENT_CLINIC_OR_DEPARTMENT_OTHER): Payer: Self-pay | Admitting: Physical Therapy

## 2022-10-21 VITALS — BP 131/72 | HR 62 | Temp 98.3°F | Resp 20 | Ht 72.0 in | Wt 224.0 lb

## 2022-10-21 DIAGNOSIS — I739 Peripheral vascular disease, unspecified: Secondary | ICD-10-CM

## 2022-10-21 NOTE — Progress Notes (Signed)
[facility] [place] PROGRESS NOTE  ASSESSMENT / PLAN: Bradley [name] is a 77 y.o. male status post right right femoral above-knee popliteal bypass and right common femoral endarterectomy on 11/30/2020.  We will see him again in 6 months for surveillance of bypass. Will order carotid duplex and call him with the results.   SUBJECTIVE: Reports right leg is persistently swollen.  This is only minimally bothersome to him. I told him this will likely continue for the rest of his life. He is understanding and appreciative.   OBJECTIVE: BP 131/72 (BP Location: Left Arm, Patient Position: Sitting, Cuff Size: Large)   Pulse 62   Temp 98.3 F (36.8 C)   Resp 20   Ht 6' (1.829 m)   Wt 224 lb (101.6 kg)   SpO2 96%   BMI 30.38 kg/m   No acute distress Regular rate and rhythm Unlabored breathing Soft, obese abdomen Right leg 1-2+ edema from midfoot to knee Right foot warm and well-perfused Right lateral calf ulcerated lesion     Latest Ref Rng & Units 12/01/2020    2:30 AM 11/30/2020    1:42 PM 11/30/2020    6:47 AM  CBC  WBC 4.0 - 10.5 K/uL 7.2  11.0  7.4   Hemoglobin 13.0 - 17.0 g/dL 12.1  11.9  13.3   Hematocrit 39.0 - 52.0 % 38.4  37.4  42.7   Platelets 150 - 400 K/uL 156  170  222         Latest Ref Rng & Units 12/02/2020    1:01 AM 12/01/2020    2:30 AM 11/30/2020    1:42 PM  CMP  Glucose 70 - 99 mg/dL 161  208    BUN 8 - 23 mg/dL 12  29    Creatinine 0.61 - 1.24 mg/dL 0.81  2.72  2.60   Sodium 135 - 145 mmol/L 132  138    Potassium 3.5 - 5.1 mmol/L 3.8  3.7    Chloride 98 - 111 mmol/L 101  102    CO2 22 - 32 mmol/L 24  28    Calcium 8.9 - 10.3 mg/dL 8.9  8.3      +-------+-----------+-----------+------------+------------+  ABI/TBIToday's ABIToday's TBIPrevious ABIPrevious TBI  +-------+-----------+-----------+------------+------------+  Right 1.08       0.86       1.33        0.79           +-------+-----------+-----------+------------+------------+  Left  0.67       0.54       0.71        0.54          +-------+-----------+-----------+------------+------------+      [name]. Stanford Breed, MD [facility] Phone Number: [phone] 10/21/2022 2:00 PM

## 2022-10-22 ENCOUNTER — Other Ambulatory Visit: Payer: Self-pay | Admitting: *Deleted

## 2022-10-22 DIAGNOSIS — I6523 Occlusion and stenosis of bilateral carotid arteries: Secondary | ICD-10-CM

## 2022-10-22 LAB — VAS US ABI WITH/WO TBI
Left ABI: 0.67
Right ABI: 1.08

## 2022-10-23 DIAGNOSIS — L570 Actinic keratosis: Secondary | ICD-10-CM | POA: Diagnosis not present

## 2022-10-23 DIAGNOSIS — D0461 Carcinoma in situ of skin of right upper limb, including shoulder: Secondary | ICD-10-CM | POA: Diagnosis not present

## 2022-10-23 DIAGNOSIS — D225 Melanocytic nevi of trunk: Secondary | ICD-10-CM | POA: Diagnosis not present

## 2022-10-23 DIAGNOSIS — D2261 Melanocytic nevi of right upper limb, including shoulder: Secondary | ICD-10-CM | POA: Diagnosis not present

## 2022-10-23 DIAGNOSIS — D0361 Melanoma in situ of right upper limb, including shoulder: Secondary | ICD-10-CM | POA: Diagnosis not present

## 2022-10-23 DIAGNOSIS — D485 Neoplasm of uncertain behavior of skin: Secondary | ICD-10-CM | POA: Diagnosis not present

## 2022-10-23 DIAGNOSIS — L821 Other seborrheic keratosis: Secondary | ICD-10-CM | POA: Diagnosis not present

## 2022-10-23 DIAGNOSIS — D2271 Melanocytic nevi of right lower limb, including hip: Secondary | ICD-10-CM | POA: Diagnosis not present

## 2022-10-23 DIAGNOSIS — D2272 Melanocytic nevi of left lower limb, including hip: Secondary | ICD-10-CM | POA: Diagnosis not present

## 2022-10-23 DIAGNOSIS — C44519 Basal cell carcinoma of skin of other part of trunk: Secondary | ICD-10-CM | POA: Diagnosis not present

## 2022-10-23 DIAGNOSIS — C44529 Squamous cell carcinoma of skin of other part of trunk: Secondary | ICD-10-CM | POA: Diagnosis not present

## 2022-10-23 DIAGNOSIS — D2262 Melanocytic nevi of left upper limb, including shoulder: Secondary | ICD-10-CM | POA: Diagnosis not present

## 2022-10-27 ENCOUNTER — Ambulatory Visit (HOSPITAL_COMMUNITY)
Admission: RE | Admit: 2022-10-27 | Discharge: 2022-10-27 | Disposition: A | Discharge status: Home / Self Care | Payer: PPO | Source: Ambulatory Visit | Attending: Vascular Surgery | Admitting: Vascular Surgery

## 2022-10-27 DIAGNOSIS — I6523 Occlusion and stenosis of bilateral carotid arteries: Secondary | ICD-10-CM | POA: Diagnosis not present

## 2022-10-28 ENCOUNTER — Ambulatory Visit (INDEPENDENT_AMBULATORY_CARE_PROVIDER_SITE_OTHER): Payer: PPO | Admitting: Vascular Surgery

## 2022-10-28 DIAGNOSIS — M9903 Segmental and somatic dysfunction of lumbar region: Secondary | ICD-10-CM | POA: Diagnosis not present

## 2022-10-28 DIAGNOSIS — M5412 Radiculopathy, cervical region: Secondary | ICD-10-CM | POA: Diagnosis not present

## 2022-10-28 DIAGNOSIS — M5033 Other cervical disc degeneration, cervicothoracic region: Secondary | ICD-10-CM | POA: Diagnosis not present

## 2022-10-28 DIAGNOSIS — M9901 Segmental and somatic dysfunction of cervical region: Secondary | ICD-10-CM | POA: Diagnosis not present

## 2022-10-28 DIAGNOSIS — I6523 Occlusion and stenosis of bilateral carotid arteries: Secondary | ICD-10-CM

## 2022-10-28 NOTE — Progress Notes (Unsigned)
Carotid duplex unremarkable. Called patient x2 but he did not answer.  [name]. Stanford Breed, MD [facility] Phone Number: [phone] 10/28/2022 3:04 PM

## 2022-10-29 ENCOUNTER — Ambulatory Visit (HOSPITAL_BASED_OUTPATIENT_CLINIC_OR_DEPARTMENT_OTHER): Payer: PPO | Admitting: Physical Therapy

## 2022-10-29 ENCOUNTER — Encounter (HOSPITAL_BASED_OUTPATIENT_CLINIC_OR_DEPARTMENT_OTHER): Payer: Self-pay

## 2022-10-30 DIAGNOSIS — E871 Hypo-osmolality and hyponatremia: Secondary | ICD-10-CM | POA: Diagnosis not present

## 2022-10-30 DIAGNOSIS — I639 Cerebral infarction, unspecified: Secondary | ICD-10-CM | POA: Diagnosis not present

## 2022-10-30 DIAGNOSIS — I129 Hypertensive chronic kidney disease with stage 1 through stage 4 chronic kidney disease, or unspecified chronic kidney disease: Secondary | ICD-10-CM | POA: Diagnosis not present

## 2022-10-30 DIAGNOSIS — N179 Acute kidney failure, unspecified: Secondary | ICD-10-CM | POA: Diagnosis not present

## 2022-10-30 DIAGNOSIS — E1122 Type 2 diabetes mellitus with diabetic chronic kidney disease: Secondary | ICD-10-CM | POA: Diagnosis not present

## 2022-10-30 DIAGNOSIS — E785 Hyperlipidemia, unspecified: Secondary | ICD-10-CM | POA: Diagnosis not present

## 2022-10-30 DIAGNOSIS — R809 Proteinuria, unspecified: Secondary | ICD-10-CM | POA: Diagnosis not present

## 2022-10-30 DIAGNOSIS — N184 Chronic kidney disease, stage 4 (severe): Secondary | ICD-10-CM | POA: Diagnosis not present

## 2022-10-30 DIAGNOSIS — G4733 Obstructive sleep apnea (adult) (pediatric): Secondary | ICD-10-CM | POA: Diagnosis not present

## 2022-10-30 DIAGNOSIS — I6529 Occlusion and stenosis of unspecified carotid artery: Secondary | ICD-10-CM | POA: Diagnosis not present

## 2022-10-30 DIAGNOSIS — I2510 Atherosclerotic heart disease of native coronary artery without angina pectoris: Secondary | ICD-10-CM | POA: Diagnosis not present

## 2022-10-31 DIAGNOSIS — I2510 Atherosclerotic heart disease of native coronary artery without angina pectoris: Secondary | ICD-10-CM | POA: Diagnosis not present

## 2022-10-31 DIAGNOSIS — E041 Nontoxic single thyroid nodule: Secondary | ICD-10-CM | POA: Diagnosis not present

## 2022-10-31 DIAGNOSIS — Z9989 Dependence on other enabling machines and devices: Secondary | ICD-10-CM | POA: Diagnosis not present

## 2022-10-31 DIAGNOSIS — Z794 Long term (current) use of insulin: Secondary | ICD-10-CM | POA: Diagnosis not present

## 2022-10-31 DIAGNOSIS — I10 Essential (primary) hypertension: Secondary | ICD-10-CM | POA: Diagnosis not present

## 2022-10-31 DIAGNOSIS — E1142 Type 2 diabetes mellitus with diabetic polyneuropathy: Secondary | ICD-10-CM | POA: Diagnosis not present

## 2022-11-02 LAB — LAB REPORT - SCANNED
Creatinine, POC: 80.4 mg/dL
EGFR: 23

## 2022-11-05 ENCOUNTER — Encounter (HOSPITAL_BASED_OUTPATIENT_CLINIC_OR_DEPARTMENT_OTHER): Payer: Self-pay | Admitting: Physical Therapy

## 2022-11-05 ENCOUNTER — Ambulatory Visit (HOSPITAL_BASED_OUTPATIENT_CLINIC_OR_DEPARTMENT_OTHER): Payer: PPO | Admitting: Physical Therapy

## 2022-11-05 DIAGNOSIS — R2689 Other abnormalities of gait and mobility: Secondary | ICD-10-CM

## 2022-11-05 DIAGNOSIS — M542 Cervicalgia: Secondary | ICD-10-CM | POA: Diagnosis not present

## 2022-11-05 NOTE — Therapy (Addendum)
 OUTPATIENT PHYSICAL THERAPY CERVICAL Treatment Patient Name: Bradley [name] MRN: [id number] DOB:06-Nov-1945, 77 y.o., male Today's Date: 10/20/2022      PT End of Session - 11/05/22 2004     Visit Number 14    Number of Visits 18    Date for PT Re-Evaluation 11/25/22    Authorization Type progress note done on visit 12 next to be done on 22    PT Start Time 1515    PT Stop Time 1558    PT Time Calculation (min) 43 min    Activity Tolerance Patient tolerated treatment well    Behavior During Therapy [facility] for tasks assessed/performed               Past Medical History:  Diagnosis Date   Acquired equinus deformity of foot 12/15/2011   Anemia    Arthritis    Carotid artery occlusion    a. Carotid US 10/16: RICA 60-79%, L CEA patent with 1-39% stenosis   CKD (chronic kidney disease)    [name] 3-4   Coagulation disorder ([name]) 05/25/2019   Coronary artery disease    a. Myoview 9/16:  EF 52%, inferior fixed defect consistent with diaphragmatic attenuation, intermediate risk secondary to poor exercise tolerance and symptoms during stress;  b. LHC 05/17/2015 90% mid LAD, 80% ost D2, 75% mid RCA, 35% prox RCA. >> S/p CABG   Coronary artery disease involving coronary bypass graft of native heart with angina pectoris ([name])    CORONARY ARTERY BYPASS GRAFTING x 3 - 05/23/2015 Left internal mammary artery to left anterior descending  Saphenous vein graft to diagonal  Saphenous vein graft to posterior descending Endoscopic greater saphenous vein harvest right thigh     Diabetes mellitus age 48   Diverticulitis    Diverticulosis of colon without hemorrhage 05/17/2015   By CT scan    DM type 2, controlled, with complication ([name]) 04/29/2015   Essential hypertension 04/29/2015   GERD (gastroesophageal reflux disease)    H/O hiatal hernia    History of echocardiogram    a. Echo 9/16: GLS -15.2%, EF 55-60%, grade 1 diastolic dysfunction, normal wall motion, aortic sclerosis, dilated aortic  root 39 mm, atrial septal lipomatous hypertrophy   Hyperlipidemia    Hypertension    Irritable bowel syndrome (IBS)    Myocardial infarction ([name])    silent inferior MI; patient denies MI history (03/17/13)    Neuralgia    Neuropathy 2013   OSA on CPAP 05/30/2015   Osteoporosis 2013   PAF (paroxysmal atrial fibrillation) ([name])    post CABG 2016; Amiodarone stopped 2/2 wheezing; PAF 01/12/20   Peripheral vascular disease ([name])    Plantar fasciitis 12/15/2011   Reflux esophagitis    Right bundle branch block 04/30/2015   Sleep apnea    uses CPAP   Stenosis of right internal carotid artery with cerebral infarction ([name]) 12/09/2011   Stress fracture of metatarsal bone 02/18/2012   Stroke ([name]) 1987   Right brain stroke- slight drop on left side   Syncope 01/12/2020   Type 2 diabetes mellitus with vascular disease ([name]) 05/30/2015   Wears glasses    Past Surgical History:  Procedure Laterality Date   ABDOMINAL AORTOGRAM W/LOWER EXTREMITY Bilateral 11/27/2020   Procedure: ABDOMINAL AORTOGRAM W/LOWER EXTREMITY;  Surgeon: Nada Libman, MD;  Location: [facility];  Service: Cardiovascular;  Laterality: Bilateral;   CARDIAC CATHETERIZATION N/A 05/17/2015   Procedure: Left Heart Cath and Coronary Angiography;  Surgeon: Lyn Records, MD;  Location: [facility];  Service: Cardiovascular;  Laterality: N/A;   CAROTID ENDARTERECTOMY  1994 & redo 2001   Left   COLONOSCOPY W/ BIOPSIES AND POLYPECTOMY     CORONARY ARTERY BYPASS GRAFT N/A 05/23/2015   Procedure: CORONARY ARTERY BYPASS GRAFTING (CABG) x 3 (LIMA to LAD, SVG to DIAGONAL 2, SVG to PDA) with Endoscopic Vein Harvesting from right greater saphenous vein;  Surgeon: Delight Ovens, MD;  Location: [facility] OR;  Service: Open Heart Surgery;  Laterality: N/A;   ENDARTERECTOMY FEMORAL Right 11/30/2020   Procedure: ENDARTERECTOMY FEMORAL RIGHT;  Surgeon: Larina Earthly, MD;  Location[facility] OR;  Service: Vascular;  Laterality: Right;    FEMORAL-POPLITEAL BYPASS GRAFT Right 11/30/2020   Procedure: BYPASS GRAFT FEMORAL-POPLITEAL ARTERY RIGHT;  Surgeon: Larina Earthly, MD;  Location: [facility] OR;  Service: Vascular;  Laterality: Right;   FRACTURE SURGERY  2013   Right   foo  t X's 2   ILIAC ARTERY STENT     LAPAROSCOPIC CHOLECYSTECTOMY  09/11/2016   POSTERIOR LUMBAR FUSION  Aug. 14, 2014   Level 1   PR VEIN BYPASS GRAFT,AORTO-FEM-POP  1998   SPINE SURGERY  2014   TEE WITHOUT CARDIOVERSION N/A 05/23/2015   Procedure: TRANSESOPHAGEAL ECHOCARDIOGRAM (TEE);  Surgeon: Delight Ovens, MD;  Location: [facility] OR;  Service: Open Heart Surgery;  Laterality: N/A;   TONSILLECTOMY     TRANSCAROTID ARTERY REVASCULARIZATION  Right 06/01/2020   Procedure: RIGHT TRANSCAROTID ARTERY REVASCULARIZATION;  Surgeon: Nada Libman, MD;  Location: [facility] OR;  Service: Vascular;  Laterality: Right;   TRIGGER FINGER RELEASE Right 07/14/2017   Procedure: RIGHT LONG FINGER TRIGGER RELEASE;  Surgeon: Mack Hook, MD;  Location: [facility];  Service: Orthopedics;  Laterality: Right;   Patient Active Problem List   Diagnosis Date Noted   Diabetic neuropathy ([name]) 06/03/2022   PAD (peripheral artery disease) ([name]) 11/30/2020   Carotid stenosis 06/01/2020   Carotid stenosis, asymptomatic 06/01/2020   Syncope 01/12/2020   Coagulation disorder ([name]) 05/25/2019   Coronary artery disease involving coronary bypass graft of native heart with angina pectoris ([name])    PAF (paroxysmal atrial fibrillation) ([name])    Type 2 diabetes mellitus with vascular disease ([name]) 05/30/2015   OSA on CPAP 05/30/2015   Abnormal stress test 05/18/2015   Diverticulosis of colon without hemorrhage 05/17/2015   Right bundle branch block 04/30/2015   Essential hypertension 04/29/2015   Hyperlipidemia 04/29/2015   DM type 2, controlled, with complication ([name]) 04/29/2015   Peripheral vascular disease ([name]) 06/08/2012   Stress fracture of metatarsal bone 02/18/2012    Acquired equinus deformity of foot 12/15/2011   Plantar fasciitis 12/15/2011   Stenosis of right internal carotid artery with cerebral infarction ([name]) 12/09/2011    PCP: Gildardo Cranker MD   REFERRING PROVIDER: Ottis Stain MD  REFERRING DIAG: [phone] (ICD-10-CM) - Other spondylosis with myelopathy, cervical region  THERAPY DIAG:  Cervicalgia  Other abnormalities of gait and mobility  Rationale for Evaluation and Treatment Rehabilitation  ONSET DATE:   SUBJECTIVE:  SUBJECTIVE STATEMENT: The patient reports his balance has been doing well. He has been doing his exercises.    PERTINENT HISTORY:  Anemia, acquired equinus deformity; CAD< CKD, DMII, IBS, MI, MI, neuralgia, PF, carotid stenosis Stroke, Torn Rotator cuff; CABG; vein bypass graft 1998; Spine surgery in 2014     PAIN:  Are you having pain? Yes: NPRS scale: 3/10 Pain location: right hip  Pain description: aching  Aggravating factors: just comes on  Relieving factors: goes away with time   PRECAUTIONS: Fall  WEIGHT BEARING RESTRICTIONS: No  FALLS:  Has patient fallen in last 6 months? Yes. Number of falls 2 falls both reaching forward    LIVING ENVIRONMENT: Lives with wife; some steps into the house and stairs inside OCCUPATION: retried   Goes to a gym in [place]    PLOF: Independent  PATIENT GOALS:   To have improved balance and to have less neck pain    OBJECTIVE:   DIAGNOSTIC FINDINGS:  Cervical MRI  MRI cervical spine (without) demonstrating: - At C3-4 disc bulging and uncovertebral joint hypertrophy with moderate spinal stenosis and severe bilateral foraminal stenosis; subtle myelomalacia noted within the spinal cord on the left side. - At C4-5 disc bulging and uncovertebral joint hypertrophy  with moderate to severe spinal stenosis and moderate bilateral foraminal stenosis. - At C5-6 disc bulging and facet hypertrophy with moderate spinal stenosis and severe bilateral foraminal stenosis.  Lumbar MRI: 9/10 MRI lumbar spine without contrast demonstrating: - At L5-S1: Anterior spondylolisthesis of L5 on S1 resulting in severe bilateral foraminal stenosis; posterior lumbar interbody fusion and metal hardware as above. - At L2-3: Disc bulging with mild spinal stenosis and mild bilateral foraminal stenosis. - At L4-5: Disc bulging with mild bilateral foraminal stenosis.  PATIENT SURVEYS:  FOTO    COGNITION: Overall cognitive status: Within functional limits for tasks assessed  SENSATION:   POSTURE: rounded shoulders and forward head  PALPATION: Spasming of the upper trap    CERVICAL ROM:   Active ROM A/PROM (deg) eval 12/11    Flexion    25  Extension  23 37 36  Right lateral flexion      Left lateral flexion      Right rotation 45 if he dips his neck he can get 65  55 65 67  Left rotation 45 60 65 67   (Blank rows = not tested)  UPPER EXTREMITY   MMT Right eval Left eval Right 12/11  Left 12/11  Shoulder flexion 4/5 4/5 4+/5 5/5  Shoulder extension      Shoulder abduction      Shoulder adduction      Shoulder extension      Shoulder internal rotation 4/5 4/5 5/5 5/5  Shoulder external rotation 4/5 4/5 4+/5 4+/5  Elbow flexion      Elbow extension      Wrist flexion      Wrist extension      Wrist ulnar deviation      Wrist radial deviation      Wrist pronation      Wrist supination       (Blank rows = not tested)  Grip right 40 lbs  Grip left 50 lbs    UPPER EXTREMITY MMT:  AROM  Right eval Left eval Right  left Right 2/7  Left 2/7   Shoulder flexion 140 140 160  160  160 160  Shoulder extension        Shoulder abduction  Shoulder adduction        Shoulder extension        Shoulder internal rotation        Shoulder external  rotation No pain but stiff  Pain in the left arm   No pain reaching behind his head     Middle trapezius        Lower trapezius        Elbow flexion        Elbow extension        Wrist flexion        Wrist extension        Wrist ulnar deviation        Wrist radial deviation        Wrist pronation        Wrist supination        Grip strength         (Blank rows = not tested)  LOWER EXTREMITY MMT:    MMT Right eval Left eval Right  12/11 Left  12/11 Right  3/5 Left 3/5  Hip flexion 25.9 25.8 34.4 34.9 38.4 36.5  Hip extension        Hip abduction 36.9 36.4 36.5 39.0 40.2 44.2  Hip adduction        Hip internal rotation        Hip external rotation        Knee flexion        Knee extension 40 37.5   40.7 43.7  Ankle dorsiflexion        Ankle plantarflexion        Ankle inversion        Ankle eversion         (Blank rows = not tested)   Blanace: narrow base SBA  NB EC CGA   Tandem stance SBA  Tandem stance EC min a   TODAY'S TREATMENT:                                                                                                                      DATE:  3/27  Narrow base of support eyes open 2 x 30 seconds Narrow base of support eyes closed 2 x 30 seconds  Heel toe rock on air-ex x20  March with min UE support on the air-ex x20   Step on and off of air-ex forward and lateral   Hurdle walk 3 hurdles 3 laps    3/12 Piriformis stretch 320 sec hold  LTR x20   Supine march 2x15  Supine bridge 2x20  Supine clamshell 2x15 blue   Narrow base of support eyes open 2 x 30 seconds Narrow base of support eyes closed 2 x 30 seconds  Heel toe rock on air-ex x20  March with min UE support on the air-ex x20   Trigger point release with tennis ball 1 minute   3/5 Tricpes press down 3x15 20 lbs Row machine 3x15 20lbs  Cable pull down 3x15 10 lbs  Narrow base of support eyes open 2 x 30 seconds Narrow base of support eyes closed 2 x 30 seconds  Step onto  air-ex x20 fwd and side to side   Heel toe rock on air-ex x20  March with min UE support on the air-ex  PATIENT EDUCATION:   Education details: HEP and symptom management; improving balance   Person educated: Patient Education method: Explanation, Demonstration, Tactile cues, Verbal cues, and Handouts Education comprehension: verbalized understanding, returned demonstration, verbal cues required, tactile cues required, and needs further education  HOME EXERCISE PROGRAM: Access Code: [email or web address] URL: [email or web address] Date: 06/04/2022 Prepared by: Lorayne Bender  ASSESSMENT:  CLINICAL IMPRESSION  The patient tolerated treatment well today. He was able to do gym equipment for strengthening. He had no increase in pain. He reported some leg fatigue towards the end. Te patient is progressing towards max potential with therapy. We will see him for 1-2 more visits to continue to work on his HEP.        OBJECTIVE IMPAIRMENTS: Abnormal gait, decreased activity tolerance, decreased endurance, decreased mobility, difficulty walking, decreased ROM, decreased strength, increased fascial restrictions, increased muscle spasms, impaired UE functional use, and pain.   ACTIVITY LIMITATIONS: carrying, lifting, bending, standing, squatting, stairs, transfers, and locomotion level  PARTICIPATION LIMITATIONS: cleaning, laundry, shopping, community activity, and yard work  PERSONAL FACTORS: 3+ comorbidities: past CVA, CABG, arthritis in the back   are also affecting patient's functional outcome.   REHAB POTENTIAL: Good  CLINICAL DECISION MAKING: Evolving/moderate complexity fluctuating pain levels in his neck and declining balance   EVALUATION COMPLEXITY: Moderate   GOALS: Goals reviewed with patient? Yes  SHORT TERM GOALS: Target date: 10/16/2022    Patient will increase cervical rotation by 10 degrees  Baseline: Achieved on left not achieved on the right Goal status:  Achieved 2/7  2.  Patient will report a 50% reduction in cervical headache  Baseline:  Goal status: Achieved 1 achieved 2/7  3.  Patient will required CGA for narrow base with eyes open.  Baseline:  Goal status: Achieved progressing 2/7 still requires min assist  4.  Patient will increase cervical extension by 10 degrees without pain Goal status: New goal achieved 2/7  LONG TERM GOALS: Target date:10/30/2022    Patient will demonstrate improved balance with all balance tasks to improve safety  Baseline:  Goal status: Improved balance but still high fall risk 12/11  2.  Patient will demonstrate >65 degrees of cervical rotation without pain in order to improve community safety  Baseline:  Goal status: 65 on left 12/11 achieved 2/7  3.  Patient will be independent with a complete HEP  Baseline:  Goal status: Independent with basic exercise program will continue to expand 12/11 we will continue to expand upon balance exercises   PLAN:  PT FREQUENCY: 1 time a week  PT DURATION: 6-week  PLANNED INTERVENTIONS: Therapeutic exercises, Therapeutic activity, Neuromuscular re-education, Balance training, Gait training, Patient/Family education, Self Care, Joint mobilization, DME instructions, Aquatic Therapy, Dry Needling, Electrical stimulation, Cryotherapy, Moist heat, Taping, Ultrasound, and Manual therapy  PLAN FOR NEXT SESSION: see MRI of the neck. Keep all movements gentle and without forcing movement. Work on balance exercises. Give patient a general LE strengthening exercise.  Continue with manual therapy for the neck, continue with functional strengthening continue with balance training  PHYSICAL THERAPY DISCHARGE SUMMARY  Visits from Start of Care: 14  Current functional level related to goals / functional outcomes: Improved balance;    Remaining deficits: Continued  neck pain    Education / Equipment: HEP    Patient agrees to discharge. Patient goals were met.  Patient is being discharged due to maximized rehab potential.    Dessie Coma, PT 11/06/2022, 8:25 AM

## 2022-11-06 ENCOUNTER — Encounter (HOSPITAL_BASED_OUTPATIENT_CLINIC_OR_DEPARTMENT_OTHER): Payer: Self-pay | Admitting: Physical Therapy

## 2022-11-14 DIAGNOSIS — E1142 Type 2 diabetes mellitus with diabetic polyneuropathy: Secondary | ICD-10-CM | POA: Diagnosis not present

## 2022-11-18 DIAGNOSIS — M65332 Trigger finger, left middle finger: Secondary | ICD-10-CM | POA: Diagnosis not present

## 2022-11-18 DIAGNOSIS — M65341 Trigger finger, right ring finger: Secondary | ICD-10-CM | POA: Diagnosis not present

## 2022-11-25 DIAGNOSIS — M5412 Radiculopathy, cervical region: Secondary | ICD-10-CM | POA: Diagnosis not present

## 2022-11-25 DIAGNOSIS — M5033 Other cervical disc degeneration, cervicothoracic region: Secondary | ICD-10-CM | POA: Diagnosis not present

## 2022-11-25 DIAGNOSIS — M9901 Segmental and somatic dysfunction of cervical region: Secondary | ICD-10-CM | POA: Diagnosis not present

## 2022-11-25 DIAGNOSIS — M9903 Segmental and somatic dysfunction of lumbar region: Secondary | ICD-10-CM | POA: Diagnosis not present

## 2022-12-04 DIAGNOSIS — D0361 Melanoma in situ of right upper limb, including shoulder: Secondary | ICD-10-CM | POA: Diagnosis not present

## 2022-12-09 DIAGNOSIS — M5033 Other cervical disc degeneration, cervicothoracic region: Secondary | ICD-10-CM | POA: Diagnosis not present

## 2022-12-09 DIAGNOSIS — M9901 Segmental and somatic dysfunction of cervical region: Secondary | ICD-10-CM | POA: Diagnosis not present

## 2022-12-09 DIAGNOSIS — M9903 Segmental and somatic dysfunction of lumbar region: Secondary | ICD-10-CM | POA: Diagnosis not present

## 2022-12-09 DIAGNOSIS — M5412 Radiculopathy, cervical region: Secondary | ICD-10-CM | POA: Diagnosis not present

## 2022-12-15 DIAGNOSIS — E1122 Type 2 diabetes mellitus with diabetic chronic kidney disease: Secondary | ICD-10-CM | POA: Diagnosis not present

## 2022-12-15 DIAGNOSIS — Z6830 Body mass index (BMI) 30.0-30.9, adult: Secondary | ICD-10-CM | POA: Diagnosis not present

## 2022-12-15 DIAGNOSIS — N184 Chronic kidney disease, stage 4 (severe): Secondary | ICD-10-CM | POA: Diagnosis not present

## 2022-12-15 DIAGNOSIS — M79671 Pain in right foot: Secondary | ICD-10-CM | POA: Diagnosis not present

## 2022-12-15 DIAGNOSIS — G629 Polyneuropathy, unspecified: Secondary | ICD-10-CM | POA: Diagnosis not present

## 2022-12-15 DIAGNOSIS — E1142 Type 2 diabetes mellitus with diabetic polyneuropathy: Secondary | ICD-10-CM | POA: Diagnosis not present

## 2022-12-15 DIAGNOSIS — E291 Testicular hypofunction: Secondary | ICD-10-CM | POA: Diagnosis not present

## 2022-12-15 DIAGNOSIS — I4891 Unspecified atrial fibrillation: Secondary | ICD-10-CM | POA: Diagnosis not present

## 2022-12-15 DIAGNOSIS — G479 Sleep disorder, unspecified: Secondary | ICD-10-CM | POA: Diagnosis not present

## 2022-12-15 DIAGNOSIS — D6869 Other thrombophilia: Secondary | ICD-10-CM | POA: Diagnosis not present

## 2022-12-15 DIAGNOSIS — G9520 Unspecified cord compression: Secondary | ICD-10-CM | POA: Diagnosis not present

## 2022-12-15 DIAGNOSIS — I779 Disorder of arteries and arterioles, unspecified: Secondary | ICD-10-CM | POA: Diagnosis not present

## 2022-12-16 DIAGNOSIS — M65332 Trigger finger, left middle finger: Secondary | ICD-10-CM | POA: Diagnosis not present

## 2022-12-16 DIAGNOSIS — M65341 Trigger finger, right ring finger: Secondary | ICD-10-CM | POA: Diagnosis not present

## 2022-12-18 DIAGNOSIS — C44519 Basal cell carcinoma of skin of other part of trunk: Secondary | ICD-10-CM | POA: Diagnosis not present

## 2022-12-18 DIAGNOSIS — D0462 Carcinoma in situ of skin of left upper limb, including shoulder: Secondary | ICD-10-CM | POA: Diagnosis not present

## 2022-12-18 DIAGNOSIS — C44529 Squamous cell carcinoma of skin of other part of trunk: Secondary | ICD-10-CM | POA: Diagnosis not present

## 2022-12-19 DIAGNOSIS — M25371 Other instability, right ankle: Secondary | ICD-10-CM | POA: Diagnosis not present

## 2022-12-24 DIAGNOSIS — M4712 Other spondylosis with myelopathy, cervical region: Secondary | ICD-10-CM | POA: Diagnosis not present

## 2022-12-30 DIAGNOSIS — M5412 Radiculopathy, cervical region: Secondary | ICD-10-CM | POA: Diagnosis not present

## 2022-12-30 DIAGNOSIS — M5033 Other cervical disc degeneration, cervicothoracic region: Secondary | ICD-10-CM | POA: Diagnosis not present

## 2022-12-30 DIAGNOSIS — M9903 Segmental and somatic dysfunction of lumbar region: Secondary | ICD-10-CM | POA: Diagnosis not present

## 2022-12-30 DIAGNOSIS — M9901 Segmental and somatic dysfunction of cervical region: Secondary | ICD-10-CM | POA: Diagnosis not present

## 2023-01-13 DIAGNOSIS — M5412 Radiculopathy, cervical region: Secondary | ICD-10-CM | POA: Diagnosis not present

## 2023-01-13 DIAGNOSIS — M5033 Other cervical disc degeneration, cervicothoracic region: Secondary | ICD-10-CM | POA: Diagnosis not present

## 2023-01-13 DIAGNOSIS — M9903 Segmental and somatic dysfunction of lumbar region: Secondary | ICD-10-CM | POA: Diagnosis not present

## 2023-01-13 DIAGNOSIS — M9901 Segmental and somatic dysfunction of cervical region: Secondary | ICD-10-CM | POA: Diagnosis not present

## 2023-01-14 DIAGNOSIS — E1142 Type 2 diabetes mellitus with diabetic polyneuropathy: Secondary | ICD-10-CM | POA: Diagnosis not present

## 2023-01-27 DIAGNOSIS — M9901 Segmental and somatic dysfunction of cervical region: Secondary | ICD-10-CM | POA: Diagnosis not present

## 2023-01-27 DIAGNOSIS — M9903 Segmental and somatic dysfunction of lumbar region: Secondary | ICD-10-CM | POA: Diagnosis not present

## 2023-01-27 DIAGNOSIS — M5412 Radiculopathy, cervical region: Secondary | ICD-10-CM | POA: Diagnosis not present

## 2023-01-27 DIAGNOSIS — M5033 Other cervical disc degeneration, cervicothoracic region: Secondary | ICD-10-CM | POA: Diagnosis not present

## 2023-02-03 DIAGNOSIS — E871 Hypo-osmolality and hyponatremia: Secondary | ICD-10-CM | POA: Diagnosis not present

## 2023-02-03 DIAGNOSIS — Z6831 Body mass index (BMI) 31.0-31.9, adult: Secondary | ICD-10-CM | POA: Diagnosis not present

## 2023-02-03 DIAGNOSIS — E1142 Type 2 diabetes mellitus with diabetic polyneuropathy: Secondary | ICD-10-CM | POA: Diagnosis not present

## 2023-02-03 DIAGNOSIS — N184 Chronic kidney disease, stage 4 (severe): Secondary | ICD-10-CM | POA: Diagnosis not present

## 2023-02-03 DIAGNOSIS — I129 Hypertensive chronic kidney disease with stage 1 through stage 4 chronic kidney disease, or unspecified chronic kidney disease: Secondary | ICD-10-CM | POA: Diagnosis not present

## 2023-02-03 DIAGNOSIS — I2510 Atherosclerotic heart disease of native coronary artery without angina pectoris: Secondary | ICD-10-CM | POA: Diagnosis not present

## 2023-02-03 DIAGNOSIS — N179 Acute kidney failure, unspecified: Secondary | ICD-10-CM | POA: Diagnosis not present

## 2023-02-03 DIAGNOSIS — E1122 Type 2 diabetes mellitus with diabetic chronic kidney disease: Secondary | ICD-10-CM | POA: Diagnosis not present

## 2023-02-03 DIAGNOSIS — I639 Cerebral infarction, unspecified: Secondary | ICD-10-CM | POA: Diagnosis not present

## 2023-02-03 DIAGNOSIS — E785 Hyperlipidemia, unspecified: Secondary | ICD-10-CM | POA: Diagnosis not present

## 2023-02-03 DIAGNOSIS — I10 Essential (primary) hypertension: Secondary | ICD-10-CM | POA: Diagnosis not present

## 2023-02-03 DIAGNOSIS — G4733 Obstructive sleep apnea (adult) (pediatric): Secondary | ICD-10-CM | POA: Diagnosis not present

## 2023-02-03 DIAGNOSIS — I6529 Occlusion and stenosis of unspecified carotid artery: Secondary | ICD-10-CM | POA: Diagnosis not present

## 2023-02-03 DIAGNOSIS — R809 Proteinuria, unspecified: Secondary | ICD-10-CM | POA: Diagnosis not present

## 2023-02-04 LAB — LAB REPORT - SCANNED
Creatinine, POC: 52.8 mg/dL
EGFR: 27

## 2023-02-10 DIAGNOSIS — M5033 Other cervical disc degeneration, cervicothoracic region: Secondary | ICD-10-CM | POA: Diagnosis not present

## 2023-02-10 DIAGNOSIS — M9901 Segmental and somatic dysfunction of cervical region: Secondary | ICD-10-CM | POA: Diagnosis not present

## 2023-02-10 DIAGNOSIS — M5412 Radiculopathy, cervical region: Secondary | ICD-10-CM | POA: Diagnosis not present

## 2023-02-10 DIAGNOSIS — M9903 Segmental and somatic dysfunction of lumbar region: Secondary | ICD-10-CM | POA: Diagnosis not present

## 2023-02-24 DIAGNOSIS — M5033 Other cervical disc degeneration, cervicothoracic region: Secondary | ICD-10-CM | POA: Diagnosis not present

## 2023-02-24 DIAGNOSIS — M5412 Radiculopathy, cervical region: Secondary | ICD-10-CM | POA: Diagnosis not present

## 2023-02-24 DIAGNOSIS — M9901 Segmental and somatic dysfunction of cervical region: Secondary | ICD-10-CM | POA: Diagnosis not present

## 2023-02-24 DIAGNOSIS — M9903 Segmental and somatic dysfunction of lumbar region: Secondary | ICD-10-CM | POA: Diagnosis not present

## 2023-03-09 DIAGNOSIS — E1165 Type 2 diabetes mellitus with hyperglycemia: Secondary | ICD-10-CM | POA: Diagnosis not present

## 2023-03-09 DIAGNOSIS — M489 Spondylopathy, unspecified: Secondary | ICD-10-CM | POA: Diagnosis not present

## 2023-03-09 DIAGNOSIS — Z9989 Dependence on other enabling machines and devices: Secondary | ICD-10-CM | POA: Diagnosis not present

## 2023-03-09 DIAGNOSIS — M48061 Spinal stenosis, lumbar region without neurogenic claudication: Secondary | ICD-10-CM | POA: Diagnosis not present

## 2023-03-09 DIAGNOSIS — Z6830 Body mass index (BMI) 30.0-30.9, adult: Secondary | ICD-10-CM | POA: Diagnosis not present

## 2023-03-09 DIAGNOSIS — L219 Seborrheic dermatitis, unspecified: Secondary | ICD-10-CM | POA: Diagnosis not present

## 2023-03-10 DIAGNOSIS — M5033 Other cervical disc degeneration, cervicothoracic region: Secondary | ICD-10-CM | POA: Diagnosis not present

## 2023-03-10 DIAGNOSIS — M9901 Segmental and somatic dysfunction of cervical region: Secondary | ICD-10-CM | POA: Diagnosis not present

## 2023-03-10 DIAGNOSIS — M9903 Segmental and somatic dysfunction of lumbar region: Secondary | ICD-10-CM | POA: Diagnosis not present

## 2023-03-10 DIAGNOSIS — E1142 Type 2 diabetes mellitus with diabetic polyneuropathy: Secondary | ICD-10-CM | POA: Diagnosis not present

## 2023-03-10 DIAGNOSIS — M5412 Radiculopathy, cervical region: Secondary | ICD-10-CM | POA: Diagnosis not present

## 2023-03-16 DIAGNOSIS — G959 Disease of spinal cord, unspecified: Secondary | ICD-10-CM | POA: Diagnosis not present

## 2023-03-16 DIAGNOSIS — M5442 Lumbago with sciatica, left side: Secondary | ICD-10-CM | POA: Diagnosis not present

## 2023-03-21 DIAGNOSIS — M542 Cervicalgia: Secondary | ICD-10-CM | POA: Diagnosis not present

## 2023-03-24 DIAGNOSIS — M5033 Other cervical disc degeneration, cervicothoracic region: Secondary | ICD-10-CM | POA: Diagnosis not present

## 2023-03-24 DIAGNOSIS — M9903 Segmental and somatic dysfunction of lumbar region: Secondary | ICD-10-CM | POA: Diagnosis not present

## 2023-03-24 DIAGNOSIS — M5412 Radiculopathy, cervical region: Secondary | ICD-10-CM | POA: Diagnosis not present

## 2023-03-24 DIAGNOSIS — M9901 Segmental and somatic dysfunction of cervical region: Secondary | ICD-10-CM | POA: Diagnosis not present

## 2023-04-03 DIAGNOSIS — G959 Disease of spinal cord, unspecified: Secondary | ICD-10-CM | POA: Diagnosis not present

## 2023-04-07 DIAGNOSIS — M5033 Other cervical disc degeneration, cervicothoracic region: Secondary | ICD-10-CM | POA: Diagnosis not present

## 2023-04-07 DIAGNOSIS — M9903 Segmental and somatic dysfunction of lumbar region: Secondary | ICD-10-CM | POA: Diagnosis not present

## 2023-04-07 DIAGNOSIS — M5412 Radiculopathy, cervical region: Secondary | ICD-10-CM | POA: Diagnosis not present

## 2023-04-07 DIAGNOSIS — M9901 Segmental and somatic dysfunction of cervical region: Secondary | ICD-10-CM | POA: Diagnosis not present

## 2023-04-09 DIAGNOSIS — E1142 Type 2 diabetes mellitus with diabetic polyneuropathy: Secondary | ICD-10-CM | POA: Diagnosis not present

## 2023-04-21 DIAGNOSIS — M9901 Segmental and somatic dysfunction of cervical region: Secondary | ICD-10-CM | POA: Diagnosis not present

## 2023-04-21 DIAGNOSIS — M9903 Segmental and somatic dysfunction of lumbar region: Secondary | ICD-10-CM | POA: Diagnosis not present

## 2023-04-21 DIAGNOSIS — M5033 Other cervical disc degeneration, cervicothoracic region: Secondary | ICD-10-CM | POA: Diagnosis not present

## 2023-04-21 DIAGNOSIS — M5412 Radiculopathy, cervical region: Secondary | ICD-10-CM | POA: Diagnosis not present

## 2023-04-22 DIAGNOSIS — Z23 Encounter for immunization: Secondary | ICD-10-CM | POA: Diagnosis not present

## 2023-04-22 DIAGNOSIS — M4802 Spinal stenosis, cervical region: Secondary | ICD-10-CM | POA: Diagnosis not present

## 2023-04-22 DIAGNOSIS — Z6830 Body mass index (BMI) 30.0-30.9, adult: Secondary | ICD-10-CM | POA: Diagnosis not present

## 2023-04-23 DIAGNOSIS — M65332 Trigger finger, left middle finger: Secondary | ICD-10-CM | POA: Diagnosis not present

## 2023-04-23 DIAGNOSIS — M65341 Trigger finger, right ring finger: Secondary | ICD-10-CM | POA: Diagnosis not present

## 2023-04-30 DIAGNOSIS — Z794 Long term (current) use of insulin: Secondary | ICD-10-CM | POA: Diagnosis not present

## 2023-04-30 DIAGNOSIS — I2510 Atherosclerotic heart disease of native coronary artery without angina pectoris: Secondary | ICD-10-CM | POA: Diagnosis not present

## 2023-04-30 DIAGNOSIS — D692 Other nonthrombocytopenic purpura: Secondary | ICD-10-CM | POA: Diagnosis not present

## 2023-04-30 DIAGNOSIS — I10 Essential (primary) hypertension: Secondary | ICD-10-CM | POA: Diagnosis not present

## 2023-04-30 DIAGNOSIS — E1142 Type 2 diabetes mellitus with diabetic polyneuropathy: Secondary | ICD-10-CM | POA: Diagnosis not present

## 2023-04-30 DIAGNOSIS — E041 Nontoxic single thyroid nodule: Secondary | ICD-10-CM | POA: Diagnosis not present

## 2023-05-05 DIAGNOSIS — M9903 Segmental and somatic dysfunction of lumbar region: Secondary | ICD-10-CM | POA: Diagnosis not present

## 2023-05-05 DIAGNOSIS — M5033 Other cervical disc degeneration, cervicothoracic region: Secondary | ICD-10-CM | POA: Diagnosis not present

## 2023-05-05 DIAGNOSIS — M9901 Segmental and somatic dysfunction of cervical region: Secondary | ICD-10-CM | POA: Diagnosis not present

## 2023-05-05 DIAGNOSIS — M5412 Radiculopathy, cervical region: Secondary | ICD-10-CM | POA: Diagnosis not present

## 2023-05-07 DIAGNOSIS — D485 Neoplasm of uncertain behavior of skin: Secondary | ICD-10-CM | POA: Diagnosis not present

## 2023-05-07 DIAGNOSIS — D2262 Melanocytic nevi of left upper limb, including shoulder: Secondary | ICD-10-CM | POA: Diagnosis not present

## 2023-05-07 DIAGNOSIS — L820 Inflamed seborrheic keratosis: Secondary | ICD-10-CM | POA: Diagnosis not present

## 2023-05-07 DIAGNOSIS — L570 Actinic keratosis: Secondary | ICD-10-CM | POA: Diagnosis not present

## 2023-05-07 DIAGNOSIS — Z85828 Personal history of other malignant neoplasm of skin: Secondary | ICD-10-CM | POA: Diagnosis not present

## 2023-05-07 DIAGNOSIS — D225 Melanocytic nevi of trunk: Secondary | ICD-10-CM | POA: Diagnosis not present

## 2023-05-07 DIAGNOSIS — D235 Other benign neoplasm of skin of trunk: Secondary | ICD-10-CM | POA: Diagnosis not present

## 2023-05-07 DIAGNOSIS — Z85820 Personal history of malignant melanoma of skin: Secondary | ICD-10-CM | POA: Diagnosis not present

## 2023-05-07 DIAGNOSIS — D044 Carcinoma in situ of skin of scalp and neck: Secondary | ICD-10-CM | POA: Diagnosis not present

## 2023-05-07 DIAGNOSIS — D2271 Melanocytic nevi of right lower limb, including hip: Secondary | ICD-10-CM | POA: Diagnosis not present

## 2023-05-07 DIAGNOSIS — D2272 Melanocytic nevi of left lower limb, including hip: Secondary | ICD-10-CM | POA: Diagnosis not present

## 2023-05-07 DIAGNOSIS — D2261 Melanocytic nevi of right upper limb, including shoulder: Secondary | ICD-10-CM | POA: Diagnosis not present

## 2023-05-08 DIAGNOSIS — E1122 Type 2 diabetes mellitus with diabetic chronic kidney disease: Secondary | ICD-10-CM | POA: Diagnosis not present

## 2023-05-08 DIAGNOSIS — I639 Cerebral infarction, unspecified: Secondary | ICD-10-CM | POA: Diagnosis not present

## 2023-05-08 DIAGNOSIS — I2510 Atherosclerotic heart disease of native coronary artery without angina pectoris: Secondary | ICD-10-CM | POA: Diagnosis not present

## 2023-05-08 DIAGNOSIS — R809 Proteinuria, unspecified: Secondary | ICD-10-CM | POA: Diagnosis not present

## 2023-05-08 DIAGNOSIS — I129 Hypertensive chronic kidney disease with stage 1 through stage 4 chronic kidney disease, or unspecified chronic kidney disease: Secondary | ICD-10-CM | POA: Diagnosis not present

## 2023-05-08 DIAGNOSIS — E785 Hyperlipidemia, unspecified: Secondary | ICD-10-CM | POA: Diagnosis not present

## 2023-05-08 DIAGNOSIS — G4733 Obstructive sleep apnea (adult) (pediatric): Secondary | ICD-10-CM | POA: Diagnosis not present

## 2023-05-08 DIAGNOSIS — M7672 Peroneal tendinitis, left leg: Secondary | ICD-10-CM | POA: Diagnosis not present

## 2023-05-08 DIAGNOSIS — M79672 Pain in left foot: Secondary | ICD-10-CM | POA: Diagnosis not present

## 2023-05-08 DIAGNOSIS — E871 Hypo-osmolality and hyponatremia: Secondary | ICD-10-CM | POA: Diagnosis not present

## 2023-05-08 DIAGNOSIS — N184 Chronic kidney disease, stage 4 (severe): Secondary | ICD-10-CM | POA: Diagnosis not present

## 2023-05-08 DIAGNOSIS — I6529 Occlusion and stenosis of unspecified carotid artery: Secondary | ICD-10-CM | POA: Diagnosis not present

## 2023-05-08 DIAGNOSIS — N179 Acute kidney failure, unspecified: Secondary | ICD-10-CM | POA: Diagnosis not present

## 2023-05-09 LAB — LAB REPORT - SCANNED
Creatinine, POC: 100.9 mg/dL
EGFR: 25

## 2023-05-11 DIAGNOSIS — E1142 Type 2 diabetes mellitus with diabetic polyneuropathy: Secondary | ICD-10-CM | POA: Diagnosis not present

## 2023-05-18 ENCOUNTER — Ambulatory Visit: Payer: PPO | Attending: Cardiovascular Disease | Admitting: Cardiovascular Disease

## 2023-05-18 ENCOUNTER — Encounter: Payer: Self-pay | Admitting: *Deleted

## 2023-05-18 ENCOUNTER — Encounter: Payer: Self-pay | Admitting: Cardiovascular Disease

## 2023-05-18 VITALS — BP 116/70 | HR 85 | Ht 72.0 in | Wt 220.2 lb

## 2023-05-18 DIAGNOSIS — I6523 Occlusion and stenosis of bilateral carotid arteries: Secondary | ICD-10-CM | POA: Diagnosis not present

## 2023-05-18 DIAGNOSIS — I480 Paroxysmal atrial fibrillation: Secondary | ICD-10-CM | POA: Diagnosis not present

## 2023-05-18 DIAGNOSIS — E785 Hyperlipidemia, unspecified: Secondary | ICD-10-CM

## 2023-05-18 DIAGNOSIS — I25709 Atherosclerosis of coronary artery bypass graft(s), unspecified, with unspecified angina pectoris: Secondary | ICD-10-CM

## 2023-05-18 DIAGNOSIS — G4733 Obstructive sleep apnea (adult) (pediatric): Secondary | ICD-10-CM | POA: Diagnosis not present

## 2023-05-18 NOTE — Patient Instructions (Signed)
Medication Instructions:  No changes   Lab Work: none   Testing/Procedures: Your physician has requested that you have a lexiscan myoview. For further information please visit [email or web address] Please follow instruction sheet, as given.   Follow-Up: At [facility], you and your health needs are our priority.  As part of our continuing mission to provide you with exceptional heart care, we have created designated Provider Care Teams.  These Care Teams include your primary Cardiologist (physician) and Advanced Practice Providers (APPs -  Physician Assistants and Nurse Practitioners) who all work together to provide you with the care you need, when you need it.   Your next appointment:   12 month(s)  Provider:   Verne Carrow, MD

## 2023-05-18 NOTE — Progress Notes (Signed)
Chief Complaint  Patient presents with   Follow-up    CAD   History of Present Illness: 77 yo male with history of anemia, arthritis, carotid artery disease, CKD, CAD s/p 3V CABG in 2016, DM, HTN, HLD, sleep apnea, PAD, RBBB and paroxysmal atrial fibrillation who is here today for follow up. He has been followed in the past by Dr. Katrinka Blazing. He had 3V CABG in 2016. He has extensive PAD and is followed in the [facility] by Dr. Myra Gianotti. Prior aorto-bifemoral bypass surgery, right common femoral endarterectomy, left carotid endarterectomy x 2 and right carotid artery stenting. He has sleep apnea and is on CPAP. Echo November 2023 with LVEF=60-65%. Grade 1 diastolic dysfunction. No valve disease.   He is here today for follow up. He has some mild chest pressure at peak exercise. He rides a stationary bike and complete his workout. This is new over the past few months. The patient denies any dyspnea, palpitations, lower extremity edema, orthopnea, PND, dizziness, near syncope or syncope.   Primary Care Physician: Daisy Floro, MD   Past Medical History:  Diagnosis Date   Acquired equinus deformity of foot 12/15/2011   Anemia    Arthritis    Carotid artery occlusion    a. Carotid US 10/16: RICA 60-79%, L CEA patent with 1-39% stenosis   CKD (chronic kidney disease)    stage 3-4   Coagulation disorder ([name]) 05/25/2019   Coronary artery disease    a. Myoview 9/16:  EF 52%, inferior fixed defect consistent with diaphragmatic attenuation, intermediate risk secondary to poor exercise tolerance and symptoms during stress;  b. LHC 05/17/2015 90% mid LAD, 80% ost D2, 75% mid RCA, 35% prox RCA. >> S/p CABG   Coronary artery disease involving coronary bypass graft of native heart with angina pectoris ([name])    CORONARY ARTERY BYPASS GRAFTING x 3 - 05/23/2015 Left internal mammary artery to left anterior descending  Saphenous vein graft to diagonal  Saphenous vein graft to posterior descending  Endoscopic greater saphenous vein harvest right thigh     Diabetes mellitus age 70   Diverticulitis    Diverticulosis of colon without hemorrhage 05/17/2015   By CT scan    DM type 2, controlled, with complication ([name]) 04/29/2015   Essential hypertension 04/29/2015   GERD (gastroesophageal reflux disease)    H/O hiatal hernia    History of echocardiogram    a. Echo 9/16: GLS -15.2%, EF 55-60%, grade 1 diastolic dysfunction, normal wall motion, aortic sclerosis, dilated aortic root 39 mm, atrial septal lipomatous hypertrophy   Hyperlipidemia    Hypertension    Irritable bowel syndrome (IBS)    Myocardial infarction ([name])    silent inferior MI; patient denies MI history (03/17/13)    Neuralgia    Neuropathy 2013   OSA on CPAP 05/30/2015   Osteoporosis 2013   PAF (paroxysmal atrial fibrillation) ([name])    post CABG 2016; Amiodarone stopped 2/2 wheezing; PAF 01/12/20   Peripheral vascular disease ([name])    Plantar fasciitis 12/15/2011   Reflux esophagitis    Right bundle branch block 04/30/2015   Sleep apnea    uses CPAP   Stenosis of right internal carotid artery with cerebral infarction ([name]) 12/09/2011   Stress fracture of metatarsal bone 02/18/2012   Stroke ([name]) 1987   Right brain stroke- slight drop on left side   Syncope 01/12/2020   Type 2 diabetes mellitus with vascular disease ([name]) 05/30/2015   Wears glasses  Past Surgical History:  Procedure Laterality Date   ABDOMINAL AORTOGRAM W/LOWER EXTREMITY Bilateral 11/27/2020   Procedure: ABDOMINAL AORTOGRAM W/LOWER EXTREMITY;  Surgeon: Nada Libman, MD;  Location: [facility]AS[facility];  Service: Cardiovascular;  Laterality: Bilateral;   CARDIAC CATHETERIZATION N/A 05/17/2015   Procedure: Left Heart Cath and Coronary Angiography;  Surgeon: Lyn Records, MD;  Location: [facility] INVASIVE CV LAB;  Service: Cardiovascular;  Laterality: N/A;   CAROTID ENDARTERECTOMY  1994 & redo 2001   Left   COLONOSCOPY W/ BIOPSIES AND POLYPECTOMY      CORONARY ARTERY BYPASS GRAFT N/A 05/23/2015   Procedure: CORONARY ARTERY BYPASS GRAFTING (CABG) x 3 (LIMA to LAD, SVG to DIAGONAL 2, SVG to PDA) with Endoscopic Vein Harvesting from right greater saphenous vein;  Surgeon: Delight Ovens, MD;  Location: [facility] OR;  Service: Open Heart Surgery;  Laterality: N/A;   ENDARTERECTOMY FEMORAL Right 11/30/2020   Procedure: ENDARTERECTOMY FEMORAL RIGHT;  Surgeon: Larina Earthly, MD;  Location: [facility] OR;  Service: Vascular;  Laterality: Right;   FEMORAL-POPLITEAL BYPASS GRAFT Right 11/30/2020   Procedure: BYPASS GRAFT FEMORAL-POPLITEAL ARTERY RIGHT;  Surgeon: Larina Earthly, MD;  Location: [facility] OR;  Service: Vascular;  Laterality: Right;   FRACTURE SURGERY  2013   Right   foo  t X's 2   ILIAC ARTERY STENT     LAPAROSCOPIC CHOLECYSTECTOMY  09/11/2016   POSTERIOR LUMBAR FUSION  Aug. 14, 2014   Level 1   PR VEIN BYPASS GRAFT,AORTO-FEM-POP  1998   SPINE SURGERY  2014   TEE WITHOUT CARDIOVERSION N/A 05/23/2015   Procedure: TRANSESOPHAGEAL ECHOCARDIOGRAM (TEE);  Surgeon: Delight Ovens, MD;  Location: [facility] OR;  Service: Open Heart Surgery;  Laterality: N/A;   TONSILLECTOMY     TRANSCAROTID ARTERY REVASCULARIZATION  Right 06/01/2020   Procedure: RIGHT TRANSCAROTID ARTERY REVASCULARIZATION;  Surgeon: Nada Libman, MD;  Location: [facility] OR;  Service: Vascular;  Laterality: Right;   TRIGGER FINGER RELEASE Right 07/14/2017   Procedure: RIGHT LONG FINGER TRIGGER RELEASE;  Surgeon: Mack Hook, MD;  Location: [facility];  Service: Orthopedics;  Laterality: Right;    Current Outpatient Medications  Medication Sig Dispense Refill   amLODipine (NORVASC) 10 MG tablet Take 1 tablet (10 mg total) by mouth daily. 90 tablet 3   apixaban (ELIQUIS) 5 MG TABS tablet Take 1 tablet (5 mg total) by mouth 2 (two) times daily. 180 tablet 1   aspirin EC 81 MG tablet Take 81 mg by mouth daily. Swallow whole.     atorvastatin (LIPITOR) 40 MG tablet Take 40 mg by mouth  daily.     Cholecalciferol (VITAMIN D3) 125 MCG (5000 UT) TABS Take 5,000 Units by mouth daily.      dexlansoprazole (DEXILANT) 60 MG capsule Take 60 mg by mouth daily.      famotidine (PEPCID) 20 MG tablet Take 20 mg by mouth at bedtime as needed.     gabapentin (NEURONTIN) 300 MG capsule Take 600 mg by mouth at bedtime.      HUMALOG KWIKPEN 100 UNIT/ML KwikPen Inject 25-30 Units into the skin 3 (three) times daily.     hydrALAZINE (APRESOLINE) 25 MG tablet Take 1 tablet (25 mg total) by mouth in the morning and at bedtime. 180 tablet 2   hydrocortisone (ANUSOL-HC) 2.5 % rectal cream Place 1 application rectally daily as needed for hemorrhoids.      hydrocortisone 2.5 % cream Apply 1 application topically 2 (two) times daily as needed (irritation).  ketoconazole (NIZORAL) 2 % shampoo Apply 1 application topically daily as needed for irritation.      Melatonin 2.5 MG CHEW Chew 2.5 mg by mouth at bedtime.     methocarbamol (ROBAXIN) 500 MG tablet Take 500 mg by mouth every 6 (six) hours as needed for muscle spasms.      metoprolol tartrate (LOPRESSOR) 50 MG tablet TAKE 1 AND 1/2 TABLETS BY MOUTH TWICE DAILY 270 tablet 0   nitroGLYCERIN (NITROSTAT) 0.3 MG SL tablet Place 1 tablet (0.3 mg total) under the tongue every 5 (five) minutes as needed for chest pain. Only as needed 90 tablet 0   testosterone cypionate (DEPOTESTOSTERONE CYPIONATE) 200 MG/ML injection Inject 200 mg into the muscle once a week.     torsemide (DEMADEX) 20 MG tablet Take 1 tablet (20 mg total) by mouth daily. 90 tablet 3   traMADol (ULTRAM) 50 MG tablet Take 1 tablet (50 mg total) by mouth every 6 (six) hours as needed. 30 tablet 0   TRESIBA FLEXTOUCH 100 UNIT/ML SOPN FlexTouch Pen Inject 25 Units into the skin daily. AM     TRULICITY 0.75 MG/0.5ML SOPN Inject 0.75 mg into the skin once a week. Sunday morning     No current facility-administered medications for this visit.    Allergies  Allergen Reactions   Doxazosin  Mesylate     Other reaction(s): Diarrhea, dizziness   Hydrocodone Nausea Only   Niacin     Other reaction(s): Skin irritation   Oxycodone Nausea Only   Penicillins Rash    Has patient had a PCN reaction causing immediate rash, facial/tongue/throat swelling, SOB or lightheadedness with hypotension: NO Has patient had a PCN reaction causing severe rash involving mucus membranes or skin necrosis:NO Has patient had a PCN reaction that required hospitalization NO Has patient had a PCN reaction occurring within the last 10 years: NO If all of the above answers are "NO", then may proceed with Cephalosporin use.    Sulfa Drugs Cross Reactors Rash    Social History   Socioeconomic History   Marital status: Married    Spouse name: Not on file   Number of children: 2   Years of education: Not on file   Highest education level: Not on file  Occupational History   Not on file  Tobacco Use   Smoking status: Former    Current packs/day: 0.00    Average packs/day: 2.0 packs/day for 50.0 years (100.0 ttl pk-yrs)    Types: Cigarettes    Start date: 03/27/1959    Quit date: 03/26/2009    Years since quitting: 14.1   Smokeless tobacco: Never  Vaping Use   Vaping status: Never Used  Substance and Sexual Activity   Alcohol use: No   Drug use: No   Sexual activity: Not on file  Other Topics Concern   Not on file  Social History Narrative   Lives with wife   Caffeine coffee 3 c daily, diet coke   Social Determinants of Health   Financial Resource Strain: Not on file  Food Insecurity: Not on file  Transportation Needs: Not on file  Physical Activity: Not on file  Stress: Not on file  Social Connections: Not on file  Intimate Partner Violence: Not on file    Family History  Problem Relation Age of Onset   Cancer Mother 18       pancreatic   Hyperlipidemia Mother    Stroke Father 3   Deep vein thrombosis Father  Hyperlipidemia Father    Hypertension Father     Review of  Systems:  As stated in the HPI and otherwise negative.   BP 116/70   Pulse 85   Ht 6' (1.829 m)   Wt 99.9 kg   SpO2 96%   BMI 29.86 kg/m   Physical Examination: General: Well developed, well nourished, NAD  HEENT: OP clear, mucus membranes moist  SKIN: warm, dry. No rashes. Neuro: No focal deficits  Musculoskeletal: Muscle strength 5/5 all ext  Psychiatric: Mood and affect normal  Neck: No JVD, no carotid bruits, no thyromegaly, no lymphadenopathy.  Lungs:Clear bilaterally, no wheezes, rhonci, crackles Cardiovascular: Regular rate and rhythm. No murmurs, gallops or rubs. Abdomen:Soft. Bowel sounds present. Non-tender.  Extremities: No lower extremity edema. Pulses are 2 + in the bilateral DP/PT.  EKG:  EKG is ordered today. The ekg ordered today demonstrates  EKG Interpretation Date/Time:  Monday May 18 2023 16:00:07 EDT Ventricular Rate:  85 PR Interval:  180 QRS Duration:  138 QT Interval:  390 QTC Calculation: 464 R Axis:   -49  Text Interpretation: Sinus rhythm with Premature atrial complexes Left axis deviation Right bundle branch block When compared with ECG of 13-Jan-2020 09:58, Premature atrial complexes are now Present Confirmed by Verne Carrow [phone]) on 05/18/2023 4:06:28 PM    Recent Labs: No results found for requested labs within last 365 days.   Lipid Panel    Component Value Date/Time   CHOL 88 12/01/2020 0230   TRIG 163 (H) 12/01/2020 0230   HDL 24 (L) 12/01/2020 0230   CHOLHDL 3.7 12/01/2020 0230   VLDL 33 12/01/2020 0230   LDLCALC 31 12/01/2020 0230     Wt Readings from Last 3 Encounters:  05/18/23 99.9 kg  10/21/22 101.6 kg  05/15/22 104 kg    ssessment and Plan:   1. CAD s/p CABG with angina: He has mild chest pain with moderate exertion. Will arrange a stress myoview to exclude ischemia. Continue ASA, statin and beta blocker.   2. PAD: Followed in [facility] by Dr. Myra Gianotti.   3. Paroxysmal atrial fibrillation: Sinus today.  Continue Eliquis and beta blocker.   4. Sleep apnea: he is on CPAP  5. Hyperlipidemia: Lipids followed in primary care. Continue statin  Labs/ tests ordered today include:   Orders Placed This Encounter  Procedures   Cardiac Stress Test: Informed Consent Details: Physician/Practitioner Attestation; Transcribe to consent form and obtain patient signature   MYOCARDIAL PERFUSION IMAGING   EKG 12-Lead   Disposition:   F/U with me in 12 months    Signed, Verne Carrow, MD, [facility] 05/18/2023 6:05 PM    [facility] [address] [place], [place], [place]  [address] Phone: [phone]; Fax: [phone]

## 2023-05-27 ENCOUNTER — Telehealth (HOSPITAL_COMMUNITY): Payer: Self-pay | Admitting: *Deleted

## 2023-05-27 NOTE — Telephone Encounter (Signed)
Left message on voicemail per DPR in reference to upcoming appointment scheduled on 06/01/2023 at 10:30 with detailed instructions given per Myocardial Perfusion Study Information Sheet for the test. LM to arrive 15 minutes early, and that it is imperative to arrive on time for appointment to keep from having the test rescheduled. If you need to cancel or reschedule your appointment, please call the office within 24 hours of your appointment. Failure to do so may result in a cancellation of your appointment, and a $50 no show fee. Phone number given for call back for any questions.

## 2023-05-28 DIAGNOSIS — D045 Carcinoma in situ of skin of trunk: Secondary | ICD-10-CM | POA: Diagnosis not present

## 2023-06-01 ENCOUNTER — Ambulatory Visit (HOSPITAL_COMMUNITY): Payer: PPO | Attending: Cardiovascular Disease

## 2023-06-01 DIAGNOSIS — I25709 Atherosclerosis of coronary artery bypass graft(s), unspecified, with unspecified angina pectoris: Secondary | ICD-10-CM | POA: Diagnosis not present

## 2023-06-01 LAB — MYOCARDIAL PERFUSION IMAGING
LV dias vol: 85 mL (ref 62–150)
LV sys vol: 39 mL
Nuc Stress EF: 54 %
Peak HR: 78 {beats}/min
Rest HR: 71 {beats}/min
Rest Nuclear Isotope Dose: 8.3 mCi
SDS: 1
SRS: 0
SSS: 1
ST Depression (mm): 0 mm
Stress Nuclear Isotope Dose: 25.6 mCi
TID: 1.16

## 2023-06-01 MED ORDER — TECHNETIUM TC 99M TETROFOSMIN IV KIT
25.6000 | PACK | Freq: Once | INTRAVENOUS | Status: AC | PRN
  Administered 2023-06-01: 25.6 via INTRAVENOUS

## 2023-06-01 MED ORDER — REGADENOSON 0.4 MG/5ML IV SOLN
0.4000 mg | Freq: Once | INTRAVENOUS | Status: AC
  Administered 2023-06-01: 0.4 mg via INTRAVENOUS

## 2023-06-01 MED ORDER — TECHNETIUM TC 99M TETROFOSMIN IV KIT
8.3000 | PACK | Freq: Once | INTRAVENOUS | Status: AC | PRN
  Administered 2023-06-01: 8.3 via INTRAVENOUS

## 2023-06-05 IMAGING — US US THYROID
1 series · 14 of 25 positions shown · non-contrast
Comparison: 06/26/2020

CLINICAL DATA: Thyroid nodule follow-up

EXAM:
THYROID ULTRASOUND
TECHNIQUE: Ultrasound examination of the thyroid gland and adjacent soft
tissues was performed.

[Series 1: us thyroid · 0.08mm/px · 14 of 37 slices shown]
[im 1/37]
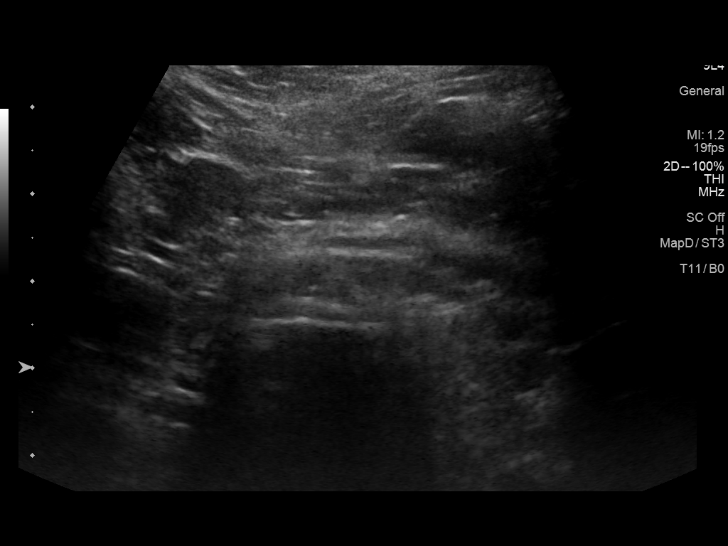
[im 4/37]
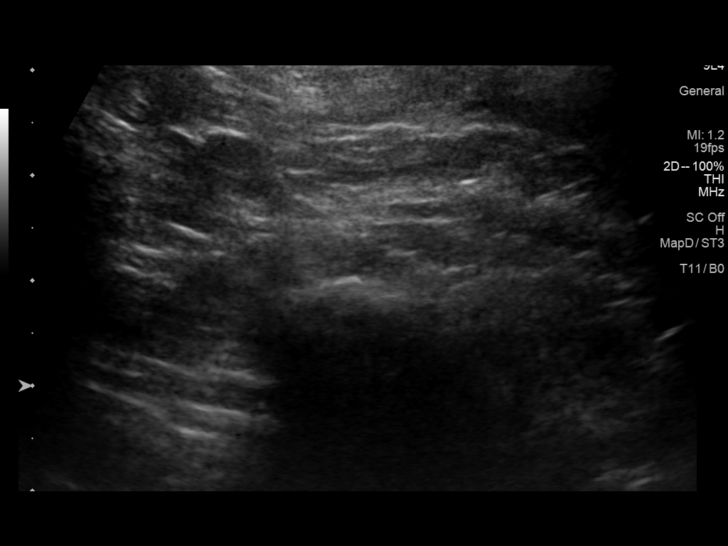
[im 7/37]
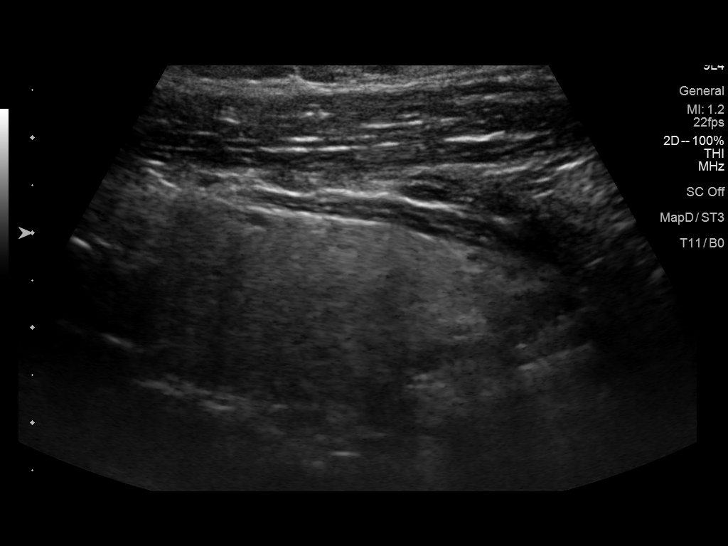
[im 10/37]
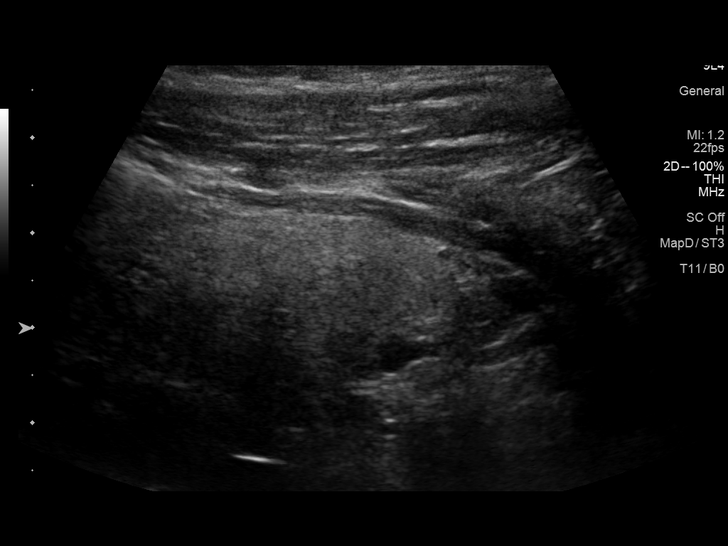
[im 13/37]
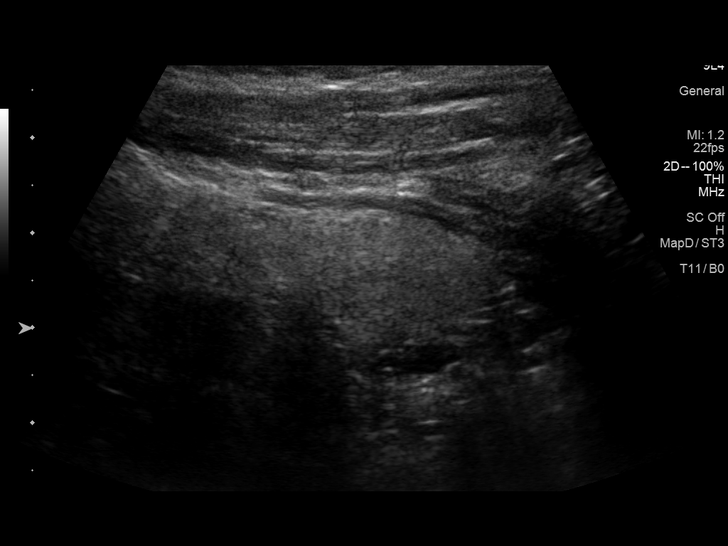
[im 14/37]
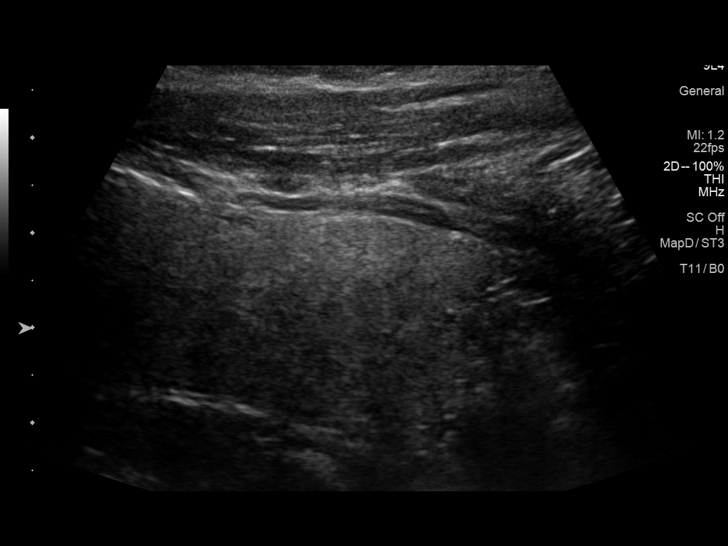
[im 17/37]
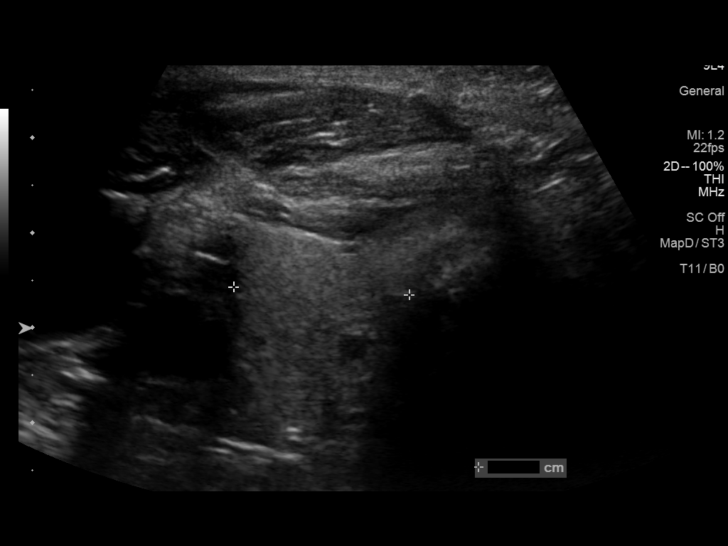
[im 20/37]
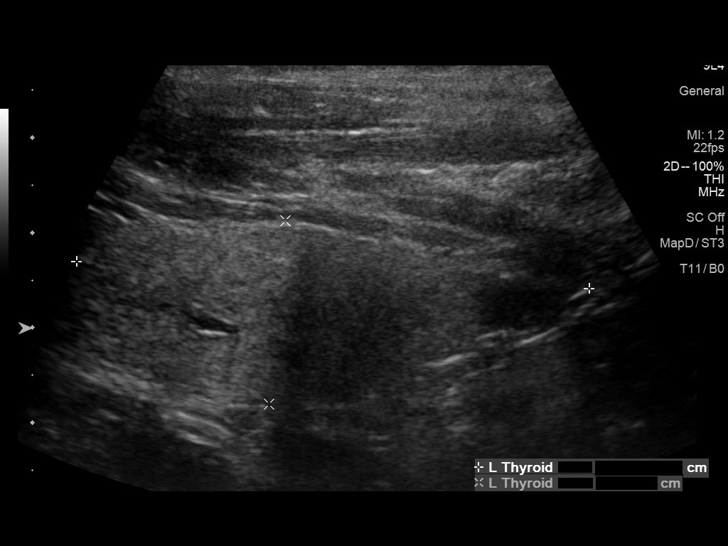
[im 23/37]
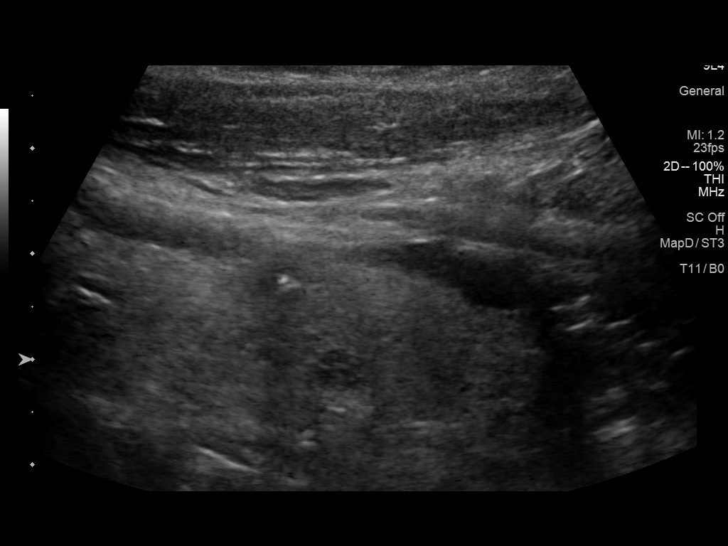
[im 25/37]
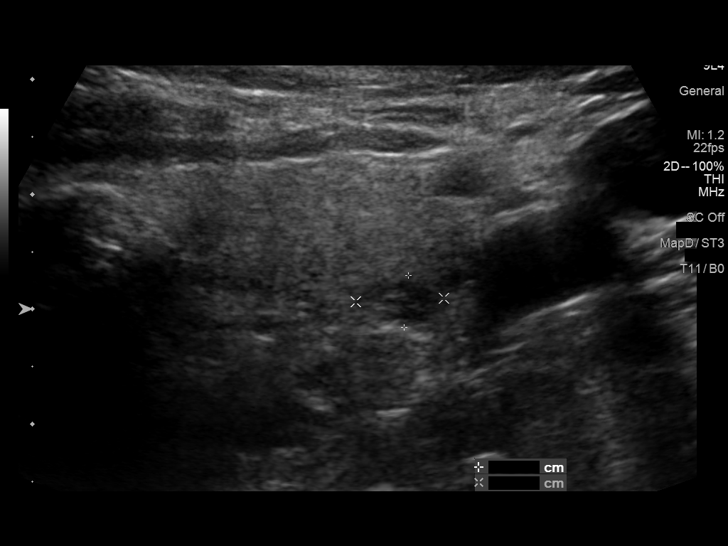
[im 28/37]
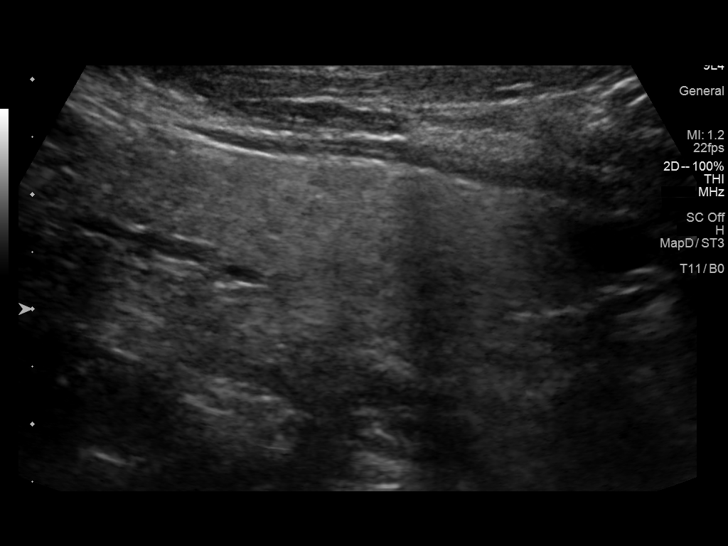
[im 31/37]
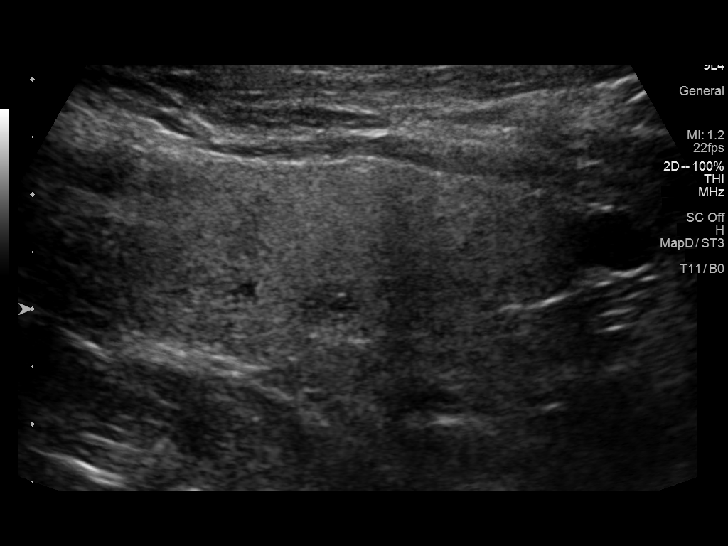
[im 34/37]
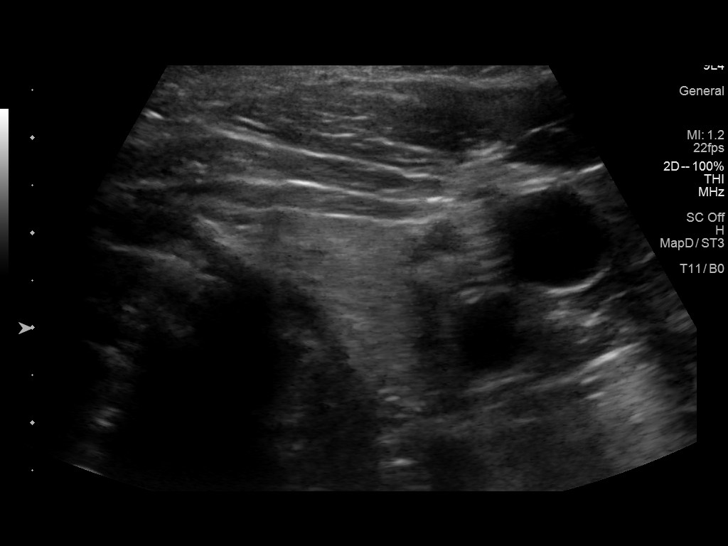
[im 37/37]
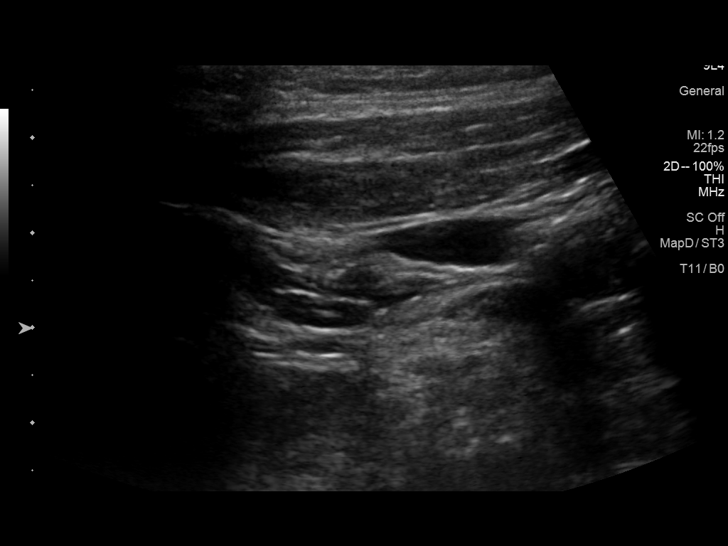

[14 of 25 positions shown; findings below may reference images not displayed]

FINDINGS: Parenchymal Echotexture: Mildly heterogeneous

Isthmus: 0.4 cm

Right lobe: 4.3 x 2.0 x 1.9 cm

Left lobe: 5.4 x 1.9 x 2.1 cm

_________________________________________________________

Estimated total number of nodules >/= 1 cm: 0

Number of spongiform nodules >/=  2 cm not described below (TR1): 0

Number of mixed cystic and solid nodules >/= 1.5 cm not described
below (TR2): 0

_________________________________________________________

Previously seen 1.4 x 1.4 x 1.3 cm mixed solid cystic nodule in the
mid left thyroid lobe has significantly decreased in size now
measuring 0.8 x 0.8 x 0.5 cm. Findings are consistent with a benign
nodule.

No new thyroid nodules are identified.
IMPRESSION: Interval decrease in size of previously seen left thyroid nodule. No
nodule seen on the current examination is which meet criteria for
FNA or imaging surveillance.

The above is in keeping with the ACR TI-RADS recommendations - [HOSPITAL] 4726;[DATE].

## 2023-06-08 ENCOUNTER — Ambulatory Visit (INDEPENDENT_AMBULATORY_CARE_PROVIDER_SITE_OTHER): Payer: PPO | Admitting: Podiatry

## 2023-06-08 DIAGNOSIS — E1159 Type 2 diabetes mellitus with other circulatory complications: Secondary | ICD-10-CM

## 2023-06-08 DIAGNOSIS — D689 Coagulation defect, unspecified: Secondary | ICD-10-CM | POA: Diagnosis not present

## 2023-06-08 NOTE — Progress Notes (Signed)
This patient presents the office for his annual diabetic foot exam.  This patient has been diagnosed with  PAD and diabetes with vascular disease previously.  He says he has had vascular surgery to improve his vascularity right foot.  He also says he has broken his right foot.  Finally he has skin surgery in his right leg.   Patient does give a history of heart surgery and kidney disease.  Patient is taking insulin and eliquis.Marland Kitchen  He presents the office today for an evaluation of his diabetic feet   Vascular  Dorsalis pedis and posterior tibial pulses are weakly   palpable  due to swelling right foot.  Dorsalis pedis and posterior tibial pulses left foot are weakly palpable.   Capillary return  WNL.  Temperature gradient is  WNL.  Skin turgor  WNL  Sensorium  Senn Weinstein monofilament wire diminished . Diminished tactile sensation.  Nail Exam  Patient has normal nails with no evidence of bacterial or fungal infection.  Orthopedic  Exam  Muscle tone and muscle strength  WNL.  No limitations of motion feet  B/L.  No crepitus or joint effusion noted.  Foot type is unremarkable and digits show no abnormalities.  Bony prominences are unremarkable.  Swelling right foot.    Skin  No open lesions.  Normal skin texture and turgor..  Diabetes with vascular disease.  His diabetic foot exam reveals vascular disease  of pedal pulses.  Evidence of neuropathy feet bilaterally.  I am concerned about his right foot which has significant swelling right foot.  Patient says the swelling is due to vascular surgery.  He wears compression socks . He says he sees his vascular doctor yearly.  RTC 1 year   Helane Gunther DPM

## 2023-06-15 DIAGNOSIS — E1142 Type 2 diabetes mellitus with diabetic polyneuropathy: Secondary | ICD-10-CM | POA: Diagnosis not present

## 2023-06-15 DIAGNOSIS — Z6830 Body mass index (BMI) 30.0-30.9, adult: Secondary | ICD-10-CM | POA: Diagnosis not present

## 2023-06-15 DIAGNOSIS — M4712 Other spondylosis with myelopathy, cervical region: Secondary | ICD-10-CM | POA: Diagnosis not present

## 2023-06-16 ENCOUNTER — Telehealth: Payer: Self-pay | Admitting: *Deleted

## 2023-06-16 NOTE — Telephone Encounter (Signed)
   Pre-operative Risk Assessment    Patient Name: Bradley [name]  DOB: [birth year] MRN: [id number]    DATE OF LAST VISIT: 05/18/23 DR. McALHANY DATE OF NEXT VISIT: NONE  Request for Surgical Clearance    Procedure:   CERVICAL FUSION  Date of Surgery:  Clearance TBD                                 Surgeon:  DR. Hoyt Koch Surgeon's Group or Practice Name:  [facility] Phone number:  [phone] Fax number:  [phone]  ATTN: Lowella Bandy EXT 8221   Type of Clearance Requested:   - Medical  - Pharmacy:  Hold Aspirin and Apixaban (Eliquis)     Type of Anesthesia:  General    Additional requests/questions:    Elpidio Anis   06/16/2023, 1:28 PM

## 2023-06-17 DIAGNOSIS — E1142 Type 2 diabetes mellitus with diabetic polyneuropathy: Secondary | ICD-10-CM | POA: Diagnosis not present

## 2023-06-17 DIAGNOSIS — E291 Testicular hypofunction: Secondary | ICD-10-CM | POA: Diagnosis not present

## 2023-06-17 DIAGNOSIS — Z125 Encounter for screening for malignant neoplasm of prostate: Secondary | ICD-10-CM | POA: Diagnosis not present

## 2023-06-17 DIAGNOSIS — E7800 Pure hypercholesterolemia, unspecified: Secondary | ICD-10-CM | POA: Diagnosis not present

## 2023-06-17 DIAGNOSIS — I10 Essential (primary) hypertension: Secondary | ICD-10-CM | POA: Diagnosis not present

## 2023-06-17 DIAGNOSIS — Z79899 Other long term (current) drug therapy: Secondary | ICD-10-CM | POA: Diagnosis not present

## 2023-06-17 NOTE — Telephone Encounter (Signed)
Pharmacy please advise on holding Eliquis prior to cervical fusion scheduled for TBD. Thank you.

## 2023-06-17 NOTE — Telephone Encounter (Signed)
Good Morning Dr. Clifton James  We have received a surgical clearance request for Bradley [name] who is scheduled to undergo cervical fusion procedure. They were seen recently in clinic on 05/18/2023.  He has a PMH of CABG x 3 in 2016, CKD, HTN, HLD, RBBB, PAF.  Can you please comment on surgical clearance. Please forward you guidance and recommendations to P CV DIV PREOP   Thanks, Alden Server

## 2023-06-18 NOTE — Telephone Encounter (Signed)
Patient with diagnosis of afib on Eliquis for anticoagulation.    Procedure: cervical fusion Date of procedure: TBD  Patient with remote stroke in 1987  CHA2DS2-VASc Score = 7   This indicates a 11.2% annual risk of stroke. The patient's score is based upon: CHF History: 0 HTN History: 1 Diabetes History: 1 Stroke History: 2 Vascular Disease History: 1 Age Score: 2 Gender Score: 0      CrCl 27 ml/min Platelet count 197  Patient with remote hx of stroke in 1987. High CHADSVASC but guidelines do not recommend bridging with DOAC. He will need to hold 4 days due to renal function and spinal surgery. Will confirm with Dr. Clifton James that he is ok with this.  **This guidance is not considered finalized until pre-operative APP has relayed final recommendations.**

## 2023-06-18 NOTE — Telephone Encounter (Signed)
Per Dr. Clifton James, patient may hold Elqiuis for 4 days prior to procedure.

## 2023-06-18 NOTE — Telephone Encounter (Signed)
   Patient Name: Bradley [name]  DOB: Mar 23, 1946 MRN: [id number]  Primary Cardiologist: Verne Carrow, MD  Chart reviewed as part of pre-operative protocol coverage. Given past medical history and time since last visit, based on ACC/AHA guidelines, Bradley [name] is at acceptable risk for the planned procedure without further cardiovascular testing.   The patient was advised that if he develops new symptoms prior to surgery to contact our office to arrange for a follow-up visit, and he verbalized understanding.  Patient can hold Eliquis 4 days prior to procedure and should restart postprocedure when surgically safe and hemostasis is achieved.  I will route this recommendation to the requesting party via Epic fax function and remove from pre-op pool.  Please call with questions.  Napoleon Form, Leodis Rains, NP 06/18/2023, 8:37 AM

## 2023-06-24 DIAGNOSIS — Z0000 Encounter for general adult medical examination without abnormal findings: Secondary | ICD-10-CM | POA: Diagnosis not present

## 2023-06-24 DIAGNOSIS — Z6831 Body mass index (BMI) 31.0-31.9, adult: Secondary | ICD-10-CM | POA: Diagnosis not present

## 2023-06-24 DIAGNOSIS — I679 Cerebrovascular disease, unspecified: Secondary | ICD-10-CM | POA: Diagnosis not present

## 2023-06-24 DIAGNOSIS — N184 Chronic kidney disease, stage 4 (severe): Secondary | ICD-10-CM | POA: Diagnosis not present

## 2023-06-24 DIAGNOSIS — Z794 Long term (current) use of insulin: Secondary | ICD-10-CM | POA: Diagnosis not present

## 2023-06-24 DIAGNOSIS — E1142 Type 2 diabetes mellitus with diabetic polyneuropathy: Secondary | ICD-10-CM | POA: Diagnosis not present

## 2023-06-24 DIAGNOSIS — I10 Essential (primary) hypertension: Secondary | ICD-10-CM | POA: Diagnosis not present

## 2023-06-24 DIAGNOSIS — E7800 Pure hypercholesterolemia, unspecified: Secondary | ICD-10-CM | POA: Diagnosis not present

## 2023-06-24 DIAGNOSIS — E291 Testicular hypofunction: Secondary | ICD-10-CM | POA: Diagnosis not present

## 2023-06-24 DIAGNOSIS — K219 Gastro-esophageal reflux disease without esophagitis: Secondary | ICD-10-CM | POA: Diagnosis not present

## 2023-06-24 DIAGNOSIS — E1122 Type 2 diabetes mellitus with diabetic chronic kidney disease: Secondary | ICD-10-CM | POA: Diagnosis not present

## 2023-07-02 ENCOUNTER — Other Ambulatory Visit: Payer: Self-pay | Admitting: Neurosurgery

## 2023-07-15 DIAGNOSIS — E1142 Type 2 diabetes mellitus with diabetic polyneuropathy: Secondary | ICD-10-CM | POA: Diagnosis not present

## 2023-07-17 ENCOUNTER — Other Ambulatory Visit: Payer: Self-pay | Admitting: Neurosurgery

## 2023-07-21 DIAGNOSIS — Z6831 Body mass index (BMI) 31.0-31.9, adult: Secondary | ICD-10-CM | POA: Diagnosis not present

## 2023-07-21 DIAGNOSIS — M940 Chondrocostal junction syndrome [Tietze]: Secondary | ICD-10-CM | POA: Diagnosis not present

## 2023-07-21 DIAGNOSIS — J069 Acute upper respiratory infection, unspecified: Secondary | ICD-10-CM | POA: Diagnosis not present

## 2023-07-21 DIAGNOSIS — Z9181 History of falling: Secondary | ICD-10-CM | POA: Diagnosis not present

## 2023-07-29 DIAGNOSIS — I2510 Atherosclerotic heart disease of native coronary artery without angina pectoris: Secondary | ICD-10-CM | POA: Diagnosis not present

## 2023-07-29 DIAGNOSIS — Z794 Long term (current) use of insulin: Secondary | ICD-10-CM | POA: Diagnosis not present

## 2023-07-29 DIAGNOSIS — I10 Essential (primary) hypertension: Secondary | ICD-10-CM | POA: Diagnosis not present

## 2023-07-29 DIAGNOSIS — E041 Nontoxic single thyroid nodule: Secondary | ICD-10-CM | POA: Diagnosis not present

## 2023-07-29 DIAGNOSIS — E1142 Type 2 diabetes mellitus with diabetic polyneuropathy: Secondary | ICD-10-CM | POA: Diagnosis not present

## 2023-08-13 ENCOUNTER — Other Ambulatory Visit: Payer: Self-pay | Admitting: Family Medicine

## 2023-08-13 ENCOUNTER — Ambulatory Visit
Admission: RE | Admit: 2023-08-13 | Discharge: 2023-08-13 | Disposition: A | Discharge status: Home / Self Care | Payer: PPO | Source: Ambulatory Visit | Attending: Family Medicine | Admitting: Family Medicine

## 2023-08-13 DIAGNOSIS — R0609 Other forms of dyspnea: Secondary | ICD-10-CM

## 2023-08-17 NOTE — Pre-Procedure Instructions (Addendum)
 Surgical Instructions   Your procedure is scheduled on August 25, 2023. Report to [facility] Main Entrance A at 5:30 A.M., then check in with the Admitting office. Any questions or running late day of surgery: call [phone]  Questions prior to your surgery date: call [phone], Monday-Friday, 8am-4pm. If you experience any cold or flu symptoms such as cough, fever, chills, shortness of breath, etc. between now and your scheduled surgery, please notify us  at the above number.     Remember:  Do not eat or drink after midnight the night before your surgery    Take these medicines the morning of surgery with A SIP OF WATER : amLODipine  (NORVASC )  atorvastatin  (LIPITOR)  dexlansoprazole (DEXILANT)  hydrALAZINE  (APRESOLINE )  metoprolol  tartrate (LOPRESSOR )    May take these medicines IF NEEDED: methocarbamol  (ROBAXIN )  nitroGLYCERIN  (NITROSTAT ) - if dose taken prior to surgery, please call either of the above phone numbers   STOP taking your apixaban  (ELIQUIS ) two days prior to surgery. Your last dose will be January 11th.  Follow your surgeon's instructions on when to stop Aspirin .  If no instructions were given by your surgeon then you will need to call the office to get those instructions.     One week prior to surgery, STOP taking any Aleve, Naproxen, Ibuprofen, Motrin, Advil, Goody's, BC's, all herbal medications, fish oil, and non-prescription vitamins.   WHAT DO I DO ABOUT MY DIABETES MEDICATION?   THE MORNING OF SURGERY, take 12.5 units of TRESIBA  FLEXTOUCH insulin .      STOP taking your Dulaglutide  (TRULICITY ) one week prior to surgery. DO NOT take any doses after January 6th.  If your CBG is greater than 220 mg/dL, you may take  of your HUMALOG  KWIKPEN insulin .   HOW TO MANAGE YOUR DIABETES BEFORE AND AFTER SURGERY  Why is it important to control my blood sugar before and after surgery? Improving blood sugar levels before and after surgery helps healing  and can limit problems. A way of improving blood sugar control is eating a healthy diet by:  Eating less sugar and carbohydrates  Increasing activity/exercise  Talking with your doctor about reaching your blood sugar goals High blood sugars (greater than 180 mg/dL) can raise your risk of infections and slow your recovery, so you will need to focus on controlling your diabetes during the weeks before surgery. Make sure that the doctor who takes care of your diabetes knows about your planned surgery including the date and location.  How do I manage my blood sugar before surgery? Check your blood sugar at least 4 times a day, starting 2 days before surgery, to make sure that the level is not too high or low.  Check your blood sugar the morning of your surgery when you wake up and every 2 hours until you get to the Short Stay unit.  If your blood sugar is less than 70 mg/dL, you will need to treat for low blood sugar: Do not take insulin . Treat a low blood sugar (less than 70 mg/dL) with  cup of clear juice (cranberry or apple), 4 glucose tablets, OR glucose gel. Recheck blood sugar in 15 minutes after treatment (to make sure it is greater than 70 mg/dL). If your blood sugar is not greater than 70 mg/dL on recheck, call [phone] for further instructions. Report your blood sugar to the short stay nurse when you get to Short Stay.  If you are admitted to the hospital after surgery: Your blood sugar will be checked by  the staff and you will probably be given insulin  after surgery (instead of oral diabetes medicines) to make sure you have good blood sugar levels. The goal for blood sugar control after surgery is 80-180 mg/dL.                      Do NOT Smoke (Tobacco/Vaping) for 24 hours prior to your procedure.  If you use a CPAP at night, you may bring your mask/headgear for your overnight stay.   You will be asked to remove any contacts, glasses, piercing's, hearing aid's,  dentures/partials prior to surgery. Please bring cases for these items if needed.    Patients discharged the day of surgery will not be allowed to drive home, and someone needs to stay with them for 24 hours.  SURGICAL WAITING ROOM VISITATION Patients may have no more than 2 support people in the waiting area - these visitors may rotate.   Pre-op nurse will coordinate an appropriate time for 1 ADULT support person, who may not rotate, to accompany patient in pre-op.  Children under the age of 34 must have an adult with them who is not the patient and must remain in the main waiting area with an adult.  If the patient needs to stay at the hospital during part of their recovery, the visitor guidelines for inpatient rooms apply.  Please refer to the [facility] website for the visitor guidelines for any additional information.   If you received a COVID test during your pre-op visit  it is requested that you wear a mask when out in public, stay away from anyone that may not be feeling well and notify your surgeon if you develop symptoms. If you have been in contact with anyone that has tested positive in the last 10 days please notify you surgeon.      Pre-operative 5 CHG Bathing Instructions   You can play a key role in reducing the risk of infection after surgery. Your skin needs to be as free of germs as possible. You can reduce the number of germs on your skin by washing with CHG (chlorhexidine  gluconate) soap before surgery. CHG is an antiseptic soap that kills germs and continues to kill germs even after washing.   DO NOT use if you have an allergy to chlorhexidine /CHG or antibacterial soaps. If your skin becomes reddened or irritated, stop using the CHG and notify one of our RNs at [phone].   Please shower with the CHG soap starting 4 days before surgery using the following schedule:     Please keep in mind the following:  DO NOT shave, including legs and underarms, starting the  day of your first shower.   You may shave your face at any point before/day of surgery.  Place clean sheets on your bed the day you start using CHG soap. Use a clean washcloth (not used since being washed) for each shower. DO NOT sleep with pets once you start using the CHG.   CHG Shower Instructions:  Wash your face and private area with normal soap. If you choose to wash your hair, wash first with your normal shampoo.  After you use shampoo/soap, rinse your hair and body thoroughly to remove shampoo/soap residue.  Turn the water  OFF and apply about 3 tablespoons (45 ml) of CHG soap to a CLEAN washcloth.  Apply CHG soap ONLY FROM YOUR NECK DOWN TO YOUR TOES (washing for 3-5 minutes)  DO NOT use CHG soap on face,  private areas, open wounds, or sores.  Pay special attention to the area where your surgery is being performed.  If you are having back surgery, having someone wash your back for you may be helpful. Wait 2 minutes after CHG soap is applied, then you may rinse off the CHG soap.  Pat dry with a clean towel  Put on clean clothes/pajamas   If you choose to wear lotion, please use ONLY the CHG-compatible lotions on the back of this paper.   Additional instructions for the day of surgery: DO NOT APPLY any lotions, deodorants, cologne, or perfumes.   Do not bring valuables to the hospital. [facility] is not responsible for any belongings/valuables. Do not wear nail polish, gel polish, artificial nails, or any other type of covering on natural nails (fingers and toes) Do not wear jewelry or makeup Put on clean/comfortable clothes.  Please brush your teeth.  Ask your nurse before applying any prescription medications to the skin.     CHG Compatible Lotions   Aveeno Moisturizing lotion  Cetaphil Moisturizing Cream  Cetaphil Moisturizing Lotion  Clairol Herbal Essence Moisturizing Lotion, Dry Skin  Clairol Herbal Essence Moisturizing Lotion, Extra Dry Skin  Clairol Herbal Essence  Moisturizing Lotion, Normal Skin  Curel Age Defying Therapeutic Moisturizing Lotion with Alpha Hydroxy  Curel Extreme Care Body Lotion  Curel Soothing Hands Moisturizing Hand Lotion  Curel Therapeutic Moisturizing Cream, Fragrance-Free  Curel Therapeutic Moisturizing Lotion, Fragrance-Free  Curel Therapeutic Moisturizing Lotion, Original Formula  Eucerin Daily Replenishing Lotion  Eucerin Dry Skin Therapy Plus Alpha Hydroxy Crme  Eucerin Dry Skin Therapy Plus Alpha Hydroxy Lotion  Eucerin Original Crme  Eucerin Original Lotion  Eucerin Plus Crme Eucerin Plus Lotion  Eucerin TriLipid Replenishing Lotion  Keri Anti-Bacterial Hand Lotion  Keri Deep Conditioning Original Lotion Dry Skin Formula Softly Scented  Keri Deep Conditioning Original Lotion, Fragrance Free Sensitive Skin Formula  Keri Lotion Fast Absorbing Fragrance Free Sensitive Skin Formula  Keri Lotion Fast Absorbing Softly Scented Dry Skin Formula  Keri Original Lotion  Keri Skin Renewal Lotion Keri Silky Smooth Lotion  Keri Silky Smooth Sensitive Skin Lotion  Nivea Body Creamy Conditioning Oil  Nivea Body Extra Enriched Lotion  Nivea Body Original Lotion  Nivea Body Sheer Moisturizing Lotion Nivea Crme  Nivea Skin Firming Lotion  NutraDerm 30 Skin Lotion  NutraDerm Skin Lotion  NutraDerm Therapeutic Skin Cream  NutraDerm Therapeutic Skin Lotion  ProShield Protective Hand Cream  Provon moisturizing lotion  Please read over the following fact sheets that you were given.

## 2023-08-18 ENCOUNTER — Encounter (HOSPITAL_COMMUNITY)
Admission: RE | Admit: 2023-08-18 | Discharge: 2023-08-18 | Disposition: A | Discharge status: Home / Self Care | Payer: PPO | Source: Ambulatory Visit | Attending: Neurosurgery | Admitting: Neurosurgery

## 2023-08-18 ENCOUNTER — Encounter (HOSPITAL_COMMUNITY): Payer: Self-pay

## 2023-08-18 ENCOUNTER — Other Ambulatory Visit: Payer: Self-pay

## 2023-08-18 VITALS — BP 108/81 | HR 87 | Temp 98.3°F | Resp 18 | Ht 70.5 in | Wt 214.2 lb

## 2023-08-18 DIAGNOSIS — N184 Chronic kidney disease, stage 4 (severe): Secondary | ICD-10-CM | POA: Diagnosis not present

## 2023-08-18 DIAGNOSIS — K589 Irritable bowel syndrome without diarrhea: Secondary | ICD-10-CM | POA: Insufficient documentation

## 2023-08-18 DIAGNOSIS — E785 Hyperlipidemia, unspecified: Secondary | ICD-10-CM | POA: Diagnosis not present

## 2023-08-18 DIAGNOSIS — Z794 Long term (current) use of insulin: Secondary | ICD-10-CM | POA: Insufficient documentation

## 2023-08-18 DIAGNOSIS — Z7984 Long term (current) use of oral hypoglycemic drugs: Secondary | ICD-10-CM | POA: Insufficient documentation

## 2023-08-18 DIAGNOSIS — Z87891 Personal history of nicotine dependence: Secondary | ICD-10-CM | POA: Diagnosis not present

## 2023-08-18 DIAGNOSIS — K219 Gastro-esophageal reflux disease without esophagitis: Secondary | ICD-10-CM | POA: Diagnosis not present

## 2023-08-18 DIAGNOSIS — Z79899 Other long term (current) drug therapy: Secondary | ICD-10-CM | POA: Diagnosis not present

## 2023-08-18 DIAGNOSIS — Z7982 Long term (current) use of aspirin: Secondary | ICD-10-CM | POA: Diagnosis not present

## 2023-08-18 DIAGNOSIS — N183 Chronic kidney disease, stage 3 unspecified: Secondary | ICD-10-CM | POA: Diagnosis not present

## 2023-08-18 DIAGNOSIS — I2510 Atherosclerotic heart disease of native coronary artery without angina pectoris: Secondary | ICD-10-CM | POA: Diagnosis not present

## 2023-08-18 DIAGNOSIS — I130 Hypertensive heart and chronic kidney disease with heart failure and stage 1 through stage 4 chronic kidney disease, or unspecified chronic kidney disease: Secondary | ICD-10-CM | POA: Diagnosis not present

## 2023-08-18 DIAGNOSIS — M4712 Other spondylosis with myelopathy, cervical region: Secondary | ICD-10-CM | POA: Insufficient documentation

## 2023-08-18 DIAGNOSIS — G4733 Obstructive sleep apnea (adult) (pediatric): Secondary | ICD-10-CM | POA: Diagnosis not present

## 2023-08-18 DIAGNOSIS — Z01812 Encounter for preprocedural laboratory examination: Secondary | ICD-10-CM | POA: Diagnosis present

## 2023-08-18 DIAGNOSIS — R809 Proteinuria, unspecified: Secondary | ICD-10-CM | POA: Diagnosis not present

## 2023-08-18 DIAGNOSIS — Z951 Presence of aortocoronary bypass graft: Secondary | ICD-10-CM | POA: Diagnosis not present

## 2023-08-18 DIAGNOSIS — R0600 Dyspnea, unspecified: Secondary | ICD-10-CM | POA: Insufficient documentation

## 2023-08-18 DIAGNOSIS — E1151 Type 2 diabetes mellitus with diabetic peripheral angiopathy without gangrene: Secondary | ICD-10-CM | POA: Insufficient documentation

## 2023-08-18 DIAGNOSIS — I4510 Unspecified right bundle-branch block: Secondary | ICD-10-CM | POA: Diagnosis not present

## 2023-08-18 DIAGNOSIS — I509 Heart failure, unspecified: Secondary | ICD-10-CM | POA: Diagnosis not present

## 2023-08-18 DIAGNOSIS — E1122 Type 2 diabetes mellitus with diabetic chronic kidney disease: Secondary | ICD-10-CM | POA: Insufficient documentation

## 2023-08-18 DIAGNOSIS — I480 Paroxysmal atrial fibrillation: Secondary | ICD-10-CM | POA: Insufficient documentation

## 2023-08-18 DIAGNOSIS — Z01818 Encounter for other preprocedural examination: Secondary | ICD-10-CM

## 2023-08-18 DIAGNOSIS — Z7901 Long term (current) use of anticoagulants: Secondary | ICD-10-CM | POA: Diagnosis not present

## 2023-08-18 HISTORY — DX: Pneumonia, unspecified organism: J18.9

## 2023-08-18 LAB — TYPE AND SCREEN
ABO/RH(D): A NEG
Antibody Screen: NEGATIVE

## 2023-08-18 LAB — GLUCOSE, CAPILLARY: Glucose-Capillary: 193 mg/dL — ABNORMAL HIGH (ref 70–99)

## 2023-08-18 LAB — SURGICAL PCR SCREEN
MRSA, PCR: NEGATIVE
Staphylococcus aureus: NEGATIVE

## 2023-08-18 NOTE — Progress Notes (Addendum)
 Anesthesia APP PAT Evaluation:   Case: [id number] Date/Time: 08/25/23 0715   Procedure: ACDF R65,R54,R43 - 3C   Anesthesia type: General   Pre-op diagnosis: SPONDYLOSIS WITH MYELOPATHY, CERVICAL REGION   [facility]: [facility]OM [facility] / [facility]   Surgeons: Debby Dorn MATSU, MD       DISCUSSION: Patient is a 78 year old male scheduled for the above procedure. He reported symptoms of right arm and thumb numbness and neck pain with extension and side to side movement. He right grip is also weak. Surgery delayed while he got a second opinion.   History includes former smoker (quit 03/26/09; 100 year pack smoker), CAD (s/p CABG: LIMA-LAD, SVG-D2, SVG-PDA 05/23/15), PAF (post CABG 2016; 01/12/20), CHF, RBBB, HTN, HLD, DM2, neuropathy, PAD (failied iliac stent followed by AFBG 1998; right femoral endarterectomy, right FPBG using Gortex graft 11/30/20), carotid artery stenosis (s/p left carotid endarterectomy 1994 with redo 2001; right TCAR 06/01/20), syncope (in setting diarrhea, hypotension 01/2020), CVA (1987), CKD, OSA (CPAP), GERD, hiatal hernia, IBS, back surgery (L5-S1 PLIF 03/24/13).   Cardiologist is Dr. Verlin, last office visit 05/18/23. Preoperative cardiology input outlined by Wyn Jackee Raddle, NP on 06/28/23, Given past medical history and time since last visit, based on ACC/AHA guidelines, MAIKEL NEISLER is at acceptable risk for the planned procedure without further cardiovascular testing... Patient can hold Eliquis  4 days prior to procedure and should restart postprocedure when surgically safe and hemostasis is achieved.   On 06/24/23, PCP Dr. KYM Dale Gull classified patient as High Risk (due to carotid disease, DM, PAF) and advised holding Eliquis  for 2 days prior to surgery.    Since medical clearance, he was re-evaluated by Dr. KYM Dale Gull on 08/13/23. Patient had fallen onto the sink in his bathroom bruising his left ribs in early-mid December. Evaluated by a covering provider on 07/21/23, but  no imaging done. He had developed progressive SOB since then with DOE with minimal exertion. O2 sats after activity dropping into the low to mid 80s but up to mid 90s after a few deep breaths. Had used Primatene  due to perceived wheezing. He reported compliance with CPAP use. Dr. Gull discontinued the primatene  and ordered a CXR. Consider pulmonology referral if symptoms progress and depending on CXR findings. Stress test in 05/2023 was non-ischemic, EF 54%. 08/13/23 CXR showed small bilateral pleural effusions, new from 10/24/2016 but similar to 06/18/2015 and mild left basilar linear subsegmental atelectasis versus scarring, slightly increased from prior. Although he denied sick symptoms, he was started on a Z-pak as a precautionary measure.  I evaluated Mr. Sill given recent DOE with small pleural effusions. His O2 sat on RA was 86% initially, but up to > 94% after a few deep breaths. He reported home O2 sats have been running ~ 92-97%. He feels his exertional dyspnea has improved over the past five days. He had initially noticed some wheezing and would be quite winded, taking several minutes to recover, with walking from room to room. Now he is may require just a few deep breaths to recover after ambulation. He weight is actually down about 7 lbs over the past 2 weeks, and he has not noticed any change in voiding or new edema. He is on torsemide  20 mg daily. He does not use any inhalers. On exam, he appears well. No coughing or hoarseness noted. No significant conversational dyspnea. Lungs sound clear. Heart RRR, no murmur noted. No significant LE edema--only trace right ankle edema (reported as chronic).  Given improving symptoms, I did not think repeat CXR was warranted. He also had labs on 08/13/23. Although Dr. Okey cleared him for surgery in November, we discussed need for updated input given recent evaluation for exertional dyspnea with transient O2 sats < 90%. Also reviewed with anesthesiologist  Leonce Athens, MD who was in agreement that Mr. Tewksbury would require additional follow-up to ensure he is optimized for surgery. Mr. Marcel said he had telephone follow-up with Dr. Okey on 08/17/23; However, I called Dr. Okey' office and left a message for him or his nurse regarding updated preoperative input. Mr. Haberland also has routine follow-up with his nephrologist Dr. Rayburn on 08/18/23.  Creatinine 2.82 on 08/13/23 (up from 2.53 in September).   A1c 8.4% on 07/29/23. He has a Dexcom6 on his RLQ of abdomen. Normal fasting glucose 150-200. CBG at pre-op 193. He in on Humalog  25-30 units TID, Tresiba  25 units daily, Trulicity  3 mg weekly.  Last Trulicity  08/10/23.   He reported instructions to hold Eliquis  and ASA for 2 days prior to surgery. PAT RN confirmed with Levon at surgeon's office.   Chart will be left for follow-up.   ADDENDUM 08/18/22 11:02 AM: 08/18/23 office note from [facility] received (scanned under Media tab). Labs repeated and showed WBC 7.3, H/H 12.5/39.9, PLT 264, glucose 156, BUN 39, Creatinine 2.70, eGFR 23, Na 139, K 4.2, AST 9, ALT 9. Renal function appears stable. Hydralazine  changed to TID from BID due to elevated reading at the end of the day.   I also received a call back from Dr. Okey' nurse Heron. He reportedly said that if anesthesiologist felt patient needed to see pulmonology prior to surgery, then he could make the referral, but from his standpoint, he felt Mr. Michels was okay to proceed without further work-up or medication changes. He still classified patient as high risk (as noted on initial risk assessment from November). Dr. Debby' scheduler Levon will have him review updated information and confirm timing of Eliquis  hold from his standpoint since PCP advised 2 days and cardiology 4 days. If any changes she will let the patient know. I also updated anesthesiologist Lucious Debby, MD. Anesthesia team to evaluate on the day of surgery.     VS:  BP 108/81   Pulse 87   Temp 36.8 C (Oral)   Resp 18   Ht 5' 10.5 (1.791 m)   Wt 97.2 kg   SpO2 94%   BMI 30.30 kg/m    PROVIDERS: Okey Carlin Redbird, MD is PCP  Verlin Bruckner, MD is cardiologist. Previously saw Claudene Shove, MD until his retirement. Rayburn Pac, MD is nephrologist Faythe Purchase, MD is endocrinologist   LABS: He had labs on 08/13/23 through [facility]. Results included WBC 7.4, H/H 12.5/38.9, PLT 258, glucose 176, BUN 32, Cr 2.82, eGFR 22, Na 139, K 4.7, albumin  4.2. A1c 8.4% on 07/29/23.  (all labs ordered are listed, but only abnormal results are displayed)  Labs Reviewed  GLUCOSE, CAPILLARY - Abnormal; Notable for the following components:      Result Value   Glucose-Capillary 193 (*)    All other components within normal limits  SURGICAL PCR SCREEN  TYPE AND SCREEN    IMAGES: CXR 08/13/23: FINDINGS: Cardiac silhouette and mediastinal contours are within normal limits. Status post median sternotomy and CABG. Small bilateral pleural effusions are new from 10/24/2016 but similar to 06/18/2015. Mild left basilar linear subsegmental atelectasis versus scarring is slightly increased from prior. No pneumothorax. Surgical  clips overlie the left neck base, unchanged. Mild-to-moderate multilevel degenerative disc changes of the thoracic spine. IMPRESSION: 1. Small bilateral pleural effusions, new from 10/24/2016 but similar to 06/18/2015. 2. Mild left basilar linear subsegmental atelectasis versus scarring, slightly increased from prior.   MRI C-spine 03/21/23: IMPRESSION: 1. Diffuse cervical disc degeneration. 2. Severe spinal stenosis at C3-4 and C4-5 with cord edema or myelomalacia at the former. 3. Moderate spinal stenosis at C5-6. 4. Moderate to severe multilevel neural foraminal stenosis as above.    EKG: 05/18/23: Sinus rhythm with Premature atrial complexes Left axis deviation Right bundle branch block When compared with  ECG of 13-Jan-2020 09:58, Premature atrial complexes are now Present Confirmed by Verlin Bruckner [phone]) on 05/18/2023 4:06:28 PM   CV: Nuclear stress test 06/01/23:    Findings are consistent with no ischemia. The study is low risk.   No ST deviation was noted.   LV perfusion is abnormal. There is no evidence of ischemia. There is no evidence of infarction. Defect 1: There is a small defect with mild reduction in uptake present in the apical inferior location(s) that is fixed. There is normal wall motion in the defect area. Consistent with artifact caused by diaphragmatic attenuation.   Left ventricular function is visually normal. Nuclear stress EF: 54%. The left ventricular ejection fraction is mildly decreased (45-54%). End diastolic cavity size is normal. End systolic cavity size is normal.   Prior study available for comparison from 10/27/2016. No changes compared to prior study.    US  Carotid 10/27/22: Summary:  - Right Carotid: Widely patent stent with no evidence of stenosis.  - Left Carotid: Velocities in the left ICA are consistent with a 1-39% stenosis.  - Vertebrals: Bilateral vertebral arteries demonstrate antegrade flow.  - Subclavians: Normal flow hemodynamics were seen in bilateral subclavian arteries.    Echo 06/24/22: IMPRESSIONS   1. Left ventricular ejection fraction, by estimation, is 60 to 65%. The  left ventricle has normal function. The left ventricle has no regional  wall motion abnormalities. There is mild concentric left ventricular  hypertrophy. Left ventricular diastolic  parameters are consistent with Grade I diastolic dysfunction (impaired  relaxation).   2. Right ventricular systolic function is normal. The right ventricular  size is normal. There is normal pulmonary artery systolic pressure.   3. No evidence of mitral valve regurgitation.   4. Aortic valve regurgitation is not visualized. Aortic valve  sclerosis/calcification is present, without  any evidence of aortic  stenosis.   5. The inferior vena cava is normal in size with greater than 50%  respiratory variability, suggesting right atrial pressure of 3 mmHg.  - Comparison(s): No significant change from prior study.  - (Comparison echo 01/13/20 LVEF 55%, inferobasal hypokinesis, grade 1 DD; 08/16/19 & 05/08/15: LVEF > 55%, no regional wall motion abnormalities)   Cardiac event monitor 08/07/15-09/05/15: NSR No atrial fibrillation No symptoms Normal with no AF     Last cardiac cath was on 05/17/15 prior to CABG.    Past Medical History:  Diagnosis Date   Acquired equinus deformity of foot 12/15/2011   Anemia    Arthritis    Carotid artery occlusion    a. Carotid US  10/16: RICA 60-79%, L CEA patent with 1-39% stenosis   CKD (chronic kidney disease)    stage 3-4   Coagulation disorder ([name]) 05/25/2019   Coronary artery disease    a. Myoview  9/16:  EF 52%, inferior fixed defect consistent with diaphragmatic attenuation, intermediate risk secondary to poor  exercise tolerance and symptoms during stress;  b. LHC 05/17/2015 90% mid LAD, 80% ost D2, 75% mid RCA, 35% prox RCA. >> S/p CABG   Coronary artery disease involving coronary bypass graft of native heart with angina pectoris ([name])    CORONARY ARTERY BYPASS GRAFTING x 3 - 05/23/2015 Left internal mammary artery to left anterior descending  Saphenous vein graft to diagonal  Saphenous vein graft to posterior descending Endoscopic greater saphenous vein harvest right thigh     Diabetes mellitus age 66   Diverticulitis    Diverticulosis of colon without hemorrhage 05/17/2015   By CT scan    DM type 2, controlled, with complication ([name]) 04/29/2015   Essential hypertension 04/29/2015   GERD (gastroesophageal reflux disease)    H/O hiatal hernia    History of echocardiogram    a. Echo 9/16: GLS -15.2%, EF 55-60%, grade 1 diastolic dysfunction, normal wall motion, aortic sclerosis, dilated aortic root 39 mm, atrial septal  lipomatous hypertrophy   Hyperlipidemia    Hypertension    Irritable bowel syndrome (IBS)    Myocardial infarction ([name])    silent inferior MI; patient denies MI history (03/17/13)    Neuralgia    Neuropathy 2013   OSA on CPAP 05/30/2015   Osteoporosis 2013   PAF (paroxysmal atrial fibrillation) ([name])    post CABG 2016; Amiodarone  stopped 2/2 wheezing; PAF 01/12/20   Peripheral vascular disease ([name])    Plantar fasciitis 12/15/2011   Pneumonia    Reflux esophagitis    Right bundle branch block 04/30/2015   Sleep apnea    uses CPAP   Stenosis of right internal carotid artery with cerebral infarction ([name]) 12/09/2011   Stress fracture of metatarsal bone 02/18/2012   Stroke ([name]) 1987   Right brain stroke- slight drop on left side   Syncope 01/12/2020   Type 2 diabetes mellitus with vascular disease ([name]) 05/30/2015   Wears glasses     Past Surgical History:  Procedure Laterality Date   ABDOMINAL AORTOGRAM W/LOWER EXTREMITY Bilateral 11/27/2020   Procedure: ABDOMINAL AORTOGRAM W/LOWER EXTREMITY;  Surgeon: Serene Gaile ORN, MD;  Location: [facility] INVASIVE CV LAB;  Service: Cardiovascular;  Laterality: Bilateral;   CARDIAC CATHETERIZATION N/A 05/17/2015   Procedure: Left Heart Cath and Coronary Angiography;  Surgeon: Victory ORN Sharps, MD;  [facility]: [facility] INVASIVE CV LAB;  Service: Cardiovascular;  Laterality: N/A;   CAROTID ENDARTERECTOMY  1994 & redo 2001   Left   CATARACT EXTRACTION W/ INTRAOCULAR LENS IMPLANT Bilateral    COLONOSCOPY W/ BIOPSIES AND POLYPECTOMY     CORONARY ARTERY BYPASS GRAFT N/A 05/23/2015   Procedure: CORONARY ARTERY BYPASS GRAFTING (CABG) x 3 (LIMA to LAD, SVG to DIAGONAL 2, SVG to PDA) with Endoscopic Vein Harvesting from right greater saphenous vein;  Surgeon: Dallas KATHEE Jude, MD;  Location[facility] OR;  Service: Open Heart Surgery;  Laterality: N/A;   ENDARTERECTOMY FEMORAL Right 11/30/2020   Procedure: ENDARTERECTOMY FEMORAL RIGHT;  Surgeon: Oris Krystal FALCON, MD;   Location: [facility] OR;  Service: Vascular;  Laterality: Right;   FEMORAL-POPLITEAL BYPASS GRAFT Right 11/30/2020   Procedure: BYPASS GRAFT FEMORAL-POPLITEAL ARTERY RIGHT;  Surgeon: Oris Krystal FALCON, MD;  Location: [facility] OR;  Service: Vascular;  Laterality: Right;   FRACTURE SURGERY  2013   Right   foo  t X's 2   ILIAC ARTERY STENT     LAPAROSCOPIC CHOLECYSTECTOMY  09/11/2016   POSTERIOR LUMBAR FUSION  03/24/2013   Level 1   PR VEIN BYPASS GRAFT,AORTO-FEM-POP  1998  SPINE SURGERY  2014   TEE WITHOUT CARDIOVERSION N/A 05/23/2015   Procedure: TRANSESOPHAGEAL ECHOCARDIOGRAM (TEE);  Surgeon: Dallas KATHEE Jude, MD;  Location: [facility] OR;  Service: Open Heart Surgery;  Laterality: N/A;   TONSILLECTOMY     TRANSCAROTID ARTERY REVASCULARIZATION  Right 06/01/2020   Procedure: RIGHT TRANSCAROTID ARTERY REVASCULARIZATION;  Surgeon: Serene Gaile ORN, MD;  Location: [facility] OR;  Service: Vascular;  Laterality: Right;   TRIGGER FINGER RELEASE Right 07/14/2017   Procedure: RIGHT LONG FINGER TRIGGER RELEASE;  Surgeon: Sebastian Lenis, MD;  Location: [facility];  Service: Orthopedics;  Laterality: Right;    MEDICATIONS:  amLODipine  (NORVASC ) 10 MG tablet   apixaban  (ELIQUIS ) 5 MG TABS tablet   aspirin  EC 81 MG tablet   atorvastatin  (LIPITOR) 40 MG tablet   Cholecalciferol  (VITAMIN D3) 125 MCG (5000 UT) TABS   dexlansoprazole (DEXILANT) 60 MG capsule   Dulaglutide  (TRULICITY ) 3 MG/0.5ML SOPN   famotidine  (PEPCID ) 20 MG tablet   gabapentin  (NEURONTIN ) 300 MG capsule   HUMALOG  KWIKPEN 100 UNIT/ML KwikPen   hydrALAZINE  (APRESOLINE ) 25 MG tablet   hydrocortisone  (ANUSOL -HC) 2.5 % rectal cream   ketoconazole  (NIZORAL ) 2 % shampoo   Melatonin 5 MG CHEW   methocarbamol  (ROBAXIN ) 500 MG tablet   metoprolol  tartrate (LOPRESSOR ) 50 MG tablet   nitroGLYCERIN  (NITROSTAT ) 0.3 MG SL tablet   testosterone  cypionate (DEPOTESTOSTERONE CYPIONATE) 200 MG/ML injection   torsemide  (DEMADEX ) 20 MG tablet   TRESIBA   FLEXTOUCH 100 UNIT/ML SOPN FlexTouch Pen   No current facility-administered medications for this encounter.   Isaiah Ruder, PA-C Surgical Short Stay/Anesthesiology [facility] Phone [phone] [facility] Phone [phone] 08/18/2023 6:57 PM

## 2023-08-18 NOTE — Progress Notes (Signed)
 PCP - Dr. Carlin Dale Gull Cardiologist - Dr. Lonni Cash - Last office visit 05/18/2023 Nephrologist - Dr. Fairy Sellar Endocrinologist - Dr. Reyes Alexander  PPM/ICD - Denies Device Orders - n/a Rep Notified - n/a  Chest x-ray - 08/17/2023 at PCP EKG - 05/18/2023 Stress Test - 06/01/2023 ECHO - 06/24/2022 Cardiac Cath - 05/17/2015  Sleep Study - +OSA. Pt wears CPAP nightly but does not know pressure settings  Pt is DM2. He has a Dexcom6 on his RLQ of abdomen. Normal fasting glucose 150-200. CBG at pre-op 193. Last A1c 8.4 on 07/29/2023  Last dose of GLP1 agonist- Last dose of Trulicity  08/10/2023 GLP1 instructions: Pt instructed to NOT take anymore doses prior to surgery  Blood Thinner Instructions: Pt instructed to hold Eliquis  x2 days. Last dose will be January 11th. Aspirin  Instructions:  Pt instructed to hold ASA x2 days. Last dose will be January 11th.  NPO after midnight  COVID TEST- n/a   Anesthesia review: Yes. Cardiac Clearance. Recent fall around 6 weeks ago with decreased oxygen saturations, including today at pre-op appointment (86% but increased to mid 90s after some deep breaths). Pt seen by Isaiah Ruder, PA-C during appointment. Hx of HTN, CAD with CABGx3, OSA on CPAP, DM2, CKD 4.  Patient denies shortness of breath, fever, cough and chest pain at PAT appointment. Pt denies any respiratory illness/infection in the last two months. He was treated with prophylactic Z-Pack by PCP due to upcoming surgery.   All instructions explained to the patient, with a verbal understanding of the material. Patient agrees to go over the instructions while at home for a better understanding. Patient also instructed to self quarantine after being tested for COVID-19. The opportunity to ask questions was provided.

## 2023-08-19 NOTE — Anesthesia Preprocedure Evaluation (Addendum)
 Anesthesia Evaluation  Patient identified by MRN, date of birth, ID band Patient awake    Reviewed: Allergy & Precautions, NPO status , Patient's Chart, lab work & pertinent test results, reviewed documented beta blocker date and time   History of Anesthesia Complications Negative for: history of anesthetic complications  Airway Mallampati: II  TM Distance: >3 FB Neck ROM: Limited    Dental no notable dental hx.    Pulmonary shortness of breath, sleep apnea , pneumonia, COPD, former smoker Small bilateral pleural effusions    + decreased breath sounds      Cardiovascular Exercise Tolerance: Poor hypertension, (-) angina + CAD, + Past MI, + CABG and + Peripheral Vascular Disease  + dysrhythmias Atrial Fibrillation  Rhythm:Regular Rate:Normal     Neuro/Psych neg Seizures  Neuromuscular disease CVA, No Residual Symptoms    GI/Hepatic hiatal hernia,GERD  ,,(+) neg Cirrhosis        Endo/Other  diabetes, Type 2    Renal/GU CRFRenal disease     Musculoskeletal  (+) Arthritis ,    Abdominal  (+) + obese  Peds  Hematology  (+) Blood dyscrasia, anemia   Anesthesia Other Findings   Reproductive/Obstetrics                             Anesthesia Physical Anesthesia Plan  ASA: 4  Anesthesia Plan: General   Post-op Pain Management:    Induction: Intravenous  PONV Risk Score and Plan: 2 and Ondansetron  and Dexamethasone   Airway Management Planned: Oral ETT and Video Laryngoscope Planned  Additional Equipment: Arterial line  Intra-op Plan:   Post-operative Plan: Extubation in OR  Informed Consent: I have reviewed the patients History and Physical, chart, labs and discussed the procedure including the risks, benefits and alternatives for the proposed anesthesia with the patient or authorized representative who has indicated his/her understanding and acceptance.     Dental advisory  given  Plan Discussed with: CRNA  Anesthesia Plan Comments: (PAT note written by Allison Zelenak, PA-C.  )       Anesthesia Quick Evaluation

## 2023-08-25 ENCOUNTER — Inpatient Hospital Stay (HOSPITAL_COMMUNITY): Payer: PPO | Admitting: Vascular Surgery

## 2023-08-25 ENCOUNTER — Encounter (HOSPITAL_COMMUNITY): Payer: Self-pay

## 2023-08-25 ENCOUNTER — Other Ambulatory Visit: Payer: Self-pay

## 2023-08-25 ENCOUNTER — Inpatient Hospital Stay (HOSPITAL_COMMUNITY): Payer: PPO

## 2023-08-25 ENCOUNTER — Inpatient Hospital Stay (HOSPITAL_COMMUNITY)
Admission: RE | Disposition: A | Discharge status: Home Health | Payer: Self-pay | Source: Home / Self Care | Attending: Neurosurgery

## 2023-08-25 ENCOUNTER — Inpatient Hospital Stay (HOSPITAL_COMMUNITY): Payer: PPO | Admitting: Anesthesiology

## 2023-08-25 ENCOUNTER — Inpatient Hospital Stay (HOSPITAL_COMMUNITY)
Admission: RE | Admit: 2023-08-25 | Discharge: 2023-08-26 | DRG: 472 | Disposition: A | Discharge status: Home Health | Payer: PPO | Attending: Neurosurgery | Admitting: Neurosurgery

## 2023-08-25 DIAGNOSIS — Z7985 Long-term (current) use of injectable non-insulin antidiabetic drugs: Secondary | ICD-10-CM | POA: Diagnosis not present

## 2023-08-25 DIAGNOSIS — M810 Age-related osteoporosis without current pathological fracture: Secondary | ICD-10-CM | POA: Diagnosis present

## 2023-08-25 DIAGNOSIS — Z8701 Personal history of pneumonia (recurrent): Secondary | ICD-10-CM

## 2023-08-25 DIAGNOSIS — Z888 Allergy status to other drugs, medicaments and biological substances status: Secondary | ICD-10-CM

## 2023-08-25 DIAGNOSIS — Z7982 Long term (current) use of aspirin: Secondary | ICD-10-CM | POA: Diagnosis not present

## 2023-08-25 DIAGNOSIS — Z951 Presence of aortocoronary bypass graft: Secondary | ICD-10-CM

## 2023-08-25 DIAGNOSIS — N189 Chronic kidney disease, unspecified: Secondary | ICD-10-CM

## 2023-08-25 DIAGNOSIS — Z79899 Other long term (current) drug therapy: Secondary | ICD-10-CM

## 2023-08-25 DIAGNOSIS — N183 Chronic kidney disease, stage 3 unspecified: Secondary | ICD-10-CM | POA: Diagnosis present

## 2023-08-25 DIAGNOSIS — I129 Hypertensive chronic kidney disease with stage 1 through stage 4 chronic kidney disease, or unspecified chronic kidney disease: Secondary | ICD-10-CM | POA: Diagnosis present

## 2023-08-25 DIAGNOSIS — Z9841 Cataract extraction status, right eye: Secondary | ICD-10-CM

## 2023-08-25 DIAGNOSIS — J449 Chronic obstructive pulmonary disease, unspecified: Secondary | ICD-10-CM | POA: Diagnosis present

## 2023-08-25 DIAGNOSIS — K219 Gastro-esophageal reflux disease without esophagitis: Secondary | ICD-10-CM | POA: Diagnosis present

## 2023-08-25 DIAGNOSIS — Z885 Allergy status to narcotic agent status: Secondary | ICD-10-CM | POA: Diagnosis not present

## 2023-08-25 DIAGNOSIS — Z7901 Long term (current) use of anticoagulants: Secondary | ICD-10-CM

## 2023-08-25 DIAGNOSIS — Z880 Allergy status to penicillin: Secondary | ICD-10-CM | POA: Diagnosis not present

## 2023-08-25 DIAGNOSIS — E785 Hyperlipidemia, unspecified: Secondary | ICD-10-CM | POA: Diagnosis present

## 2023-08-25 DIAGNOSIS — I25810 Atherosclerosis of coronary artery bypass graft(s) without angina pectoris: Secondary | ICD-10-CM | POA: Diagnosis present

## 2023-08-25 DIAGNOSIS — Z794 Long term (current) use of insulin: Secondary | ICD-10-CM

## 2023-08-25 DIAGNOSIS — I480 Paroxysmal atrial fibrillation: Secondary | ICD-10-CM | POA: Diagnosis present

## 2023-08-25 DIAGNOSIS — E1140 Type 2 diabetes mellitus with diabetic neuropathy, unspecified: Secondary | ICD-10-CM | POA: Diagnosis present

## 2023-08-25 DIAGNOSIS — M4712 Other spondylosis with myelopathy, cervical region: Secondary | ICD-10-CM

## 2023-08-25 DIAGNOSIS — Z9842 Cataract extraction status, left eye: Secondary | ICD-10-CM

## 2023-08-25 DIAGNOSIS — G4733 Obstructive sleep apnea (adult) (pediatric): Secondary | ICD-10-CM | POA: Diagnosis present

## 2023-08-25 DIAGNOSIS — Z83438 Family history of other disorder of lipoprotein metabolism and other lipidemia: Secondary | ICD-10-CM

## 2023-08-25 DIAGNOSIS — Z981 Arthrodesis status: Secondary | ICD-10-CM

## 2023-08-25 DIAGNOSIS — E1122 Type 2 diabetes mellitus with diabetic chronic kidney disease: Secondary | ICD-10-CM | POA: Diagnosis present

## 2023-08-25 DIAGNOSIS — Z87891 Personal history of nicotine dependence: Secondary | ICD-10-CM

## 2023-08-25 DIAGNOSIS — Z882 Allergy status to sulfonamides status: Secondary | ICD-10-CM

## 2023-08-25 DIAGNOSIS — I252 Old myocardial infarction: Secondary | ICD-10-CM | POA: Diagnosis not present

## 2023-08-25 DIAGNOSIS — Z79890 Hormone replacement therapy (postmenopausal): Secondary | ICD-10-CM | POA: Diagnosis not present

## 2023-08-25 DIAGNOSIS — I25119 Atherosclerotic heart disease of native coronary artery with unspecified angina pectoris: Secondary | ICD-10-CM

## 2023-08-25 DIAGNOSIS — E1151 Type 2 diabetes mellitus with diabetic peripheral angiopathy without gangrene: Secondary | ICD-10-CM | POA: Diagnosis present

## 2023-08-25 DIAGNOSIS — Z823 Family history of stroke: Secondary | ICD-10-CM

## 2023-08-25 DIAGNOSIS — Z8673 Personal history of transient ischemic attack (TIA), and cerebral infarction without residual deficits: Secondary | ICD-10-CM

## 2023-08-25 DIAGNOSIS — Z8249 Family history of ischemic heart disease and other diseases of the circulatory system: Secondary | ICD-10-CM

## 2023-08-25 DIAGNOSIS — Z961 Presence of intraocular lens: Secondary | ICD-10-CM | POA: Diagnosis present

## 2023-08-25 DIAGNOSIS — G959 Disease of spinal cord, unspecified: Principal | ICD-10-CM | POA: Diagnosis present

## 2023-08-25 HISTORY — PX: ANTERIOR CERVICAL DECOMP/DISCECTOMY FUSION: SHX1161

## 2023-08-25 LAB — GLUCOSE, CAPILLARY
Glucose-Capillary: 257 mg/dL — ABNORMAL HIGH (ref 70–99)
Glucose-Capillary: 181 mg/dL — ABNORMAL HIGH (ref 70–99)
Glucose-Capillary: 170 mg/dL — ABNORMAL HIGH (ref 70–99)
Glucose-Capillary: 191 mg/dL — ABNORMAL HIGH (ref 70–99)
Glucose-Capillary: 325 mg/dL — ABNORMAL HIGH (ref 70–99)
Glucose-Capillary: 295 mg/dL — ABNORMAL HIGH (ref 70–99)

## 2023-08-25 SURGERY — ANTERIOR CERVICAL DECOMPRESSION/DISCECTOMY FUSION 3 LEVELS
Anesthesia: General | Site: Spine Cervical

## 2023-08-25 MED ORDER — METHOCARBAMOL 500 MG PO TABS
ORAL_TABLET | ORAL | Status: AC
  Filled 2023-08-25: qty 1

## 2023-08-25 MED ORDER — GABAPENTIN 300 MG PO CAPS
600.0000 mg | ORAL_CAPSULE | Freq: Every day | ORAL | Status: DC
  Administered 2023-08-25: 600 mg via ORAL
  Filled 2023-08-25: qty 2

## 2023-08-25 MED ORDER — SODIUM CHLORIDE 0.9% FLUSH
3.0000 mL | Freq: Two times a day (BID) | INTRAVENOUS | Status: DC
  Administered 2023-08-25 (×2): 3 mL via INTRAVENOUS

## 2023-08-25 MED ORDER — ONDANSETRON HCL 4 MG/2ML IJ SOLN
INTRAMUSCULAR | Status: DC | PRN
  Administered 2023-08-25: 4 mg via INTRAVENOUS

## 2023-08-25 MED ORDER — CHLORHEXIDINE GLUCONATE CLOTH 2 % EX PADS: 6.0000 | MEDICATED_PAD | Freq: Once | CUTANEOUS | Status: DC

## 2023-08-25 MED ORDER — INSULIN GLARGINE-YFGN 100 UNIT/ML ~~LOC~~ SOLN
25.0000 [IU] | Freq: Every day | SUBCUTANEOUS | Status: DC
  Administered 2023-08-25 – 2023-08-26 (×2): 25 [IU] via SUBCUTANEOUS
  Filled 2023-08-25 (×4): qty 0.25

## 2023-08-25 MED ORDER — LIDOCAINE-EPINEPHRINE 1 %-1:100000 IJ SOLN
INTRAMUSCULAR | Status: DC | PRN
  Administered 2023-08-25: 5 mL

## 2023-08-25 MED ORDER — CHLORHEXIDINE GLUCONATE 0.12 % MT SOLN
OROMUCOSAL | Status: AC
  Administered 2023-08-25: 15 mL via OROMUCOSAL
  Filled 2023-08-25: qty 15

## 2023-08-25 MED ORDER — SENNA 8.6 MG PO TABS
1.0000 | ORAL_TABLET | Freq: Two times a day (BID) | ORAL | Status: DC | PRN
  Administered 2023-08-25: 8.6 mg via ORAL
  Filled 2023-08-25: qty 1

## 2023-08-25 MED ORDER — AMLODIPINE BESYLATE 10 MG PO TABS
10.0000 mg | ORAL_TABLET | Freq: Every day | ORAL | Status: DC
Start: 2023-08-26 — End: 2023-08-26
  Administered 2023-08-26: 10 mg via ORAL
  Filled 2023-08-25: qty 1

## 2023-08-25 MED ORDER — ATORVASTATIN CALCIUM 40 MG PO TABS
40.0000 mg | ORAL_TABLET | Freq: Every day | ORAL | Status: DC
Start: 2023-08-26 — End: 2023-08-26
  Administered 2023-08-26: 40 mg via ORAL
  Filled 2023-08-25: qty 1

## 2023-08-25 MED ORDER — INSULIN ASPART 100 UNIT/ML IJ SOLN
INTRAMUSCULAR | Status: AC
  Administered 2023-08-25: 3 [IU] via SUBCUTANEOUS
  Filled 2023-08-25: qty 1

## 2023-08-25 MED ORDER — OXYCODONE HCL 5 MG PO TABS: 5.0000 mg | ORAL_TABLET | Freq: Once | ORAL | Status: DC | PRN

## 2023-08-25 MED ORDER — TORSEMIDE 20 MG PO TABS
20.0000 mg | ORAL_TABLET | Freq: Every day | ORAL | Status: DC
  Administered 2023-08-26: 20 mg via ORAL
  Filled 2023-08-25: qty 1

## 2023-08-25 MED ORDER — CEFAZOLIN SODIUM-DEXTROSE 1-4 GM/50ML-% IV SOLN
1.0000 g | Freq: Four times a day (QID) | INTRAVENOUS | Status: AC
  Administered 2023-08-25 (×3): 1 g via INTRAVENOUS
  Filled 2023-08-25 (×3): qty 50

## 2023-08-25 MED ORDER — ALBUMIN HUMAN 5 % IV SOLN: INTRAVENOUS | Status: DC | PRN

## 2023-08-25 MED ORDER — KETOCONAZOLE 2 % EX SHAM: 1.0000 | MEDICATED_SHAMPOO | Freq: Every day | CUTANEOUS | Status: DC | PRN

## 2023-08-25 MED ORDER — ACETAMINOPHEN 325 MG PO TABS
650.0000 mg | ORAL_TABLET | ORAL | Status: DC | PRN
Start: 2023-08-25 — End: 2023-08-26
  Administered 2023-08-26: 650 mg via ORAL
  Filled 2023-08-25: qty 2

## 2023-08-25 MED ORDER — CHLORHEXIDINE GLUCONATE 0.12 % MT SOLN: 15.0000 mL | Freq: Once | OROMUCOSAL | Status: AC

## 2023-08-25 MED ORDER — ACETAMINOPHEN 650 MG RE SUPP: 650.0000 mg | RECTAL | Status: DC | PRN

## 2023-08-25 MED ORDER — SODIUM CHLORIDE 0.9 % IV SOLN
250.0000 mL | INTRAVENOUS | Status: DC
  Administered 2023-08-25: 250 mL via INTRAVENOUS

## 2023-08-25 MED ORDER — NITROGLYCERIN 0.4 MG SL SUBL: 0.4000 mg | SUBLINGUAL_TABLET | SUBLINGUAL | Status: DC | PRN

## 2023-08-25 MED ORDER — HYDROXYZINE HCL 50 MG/ML IM SOLN: 50.0000 mg | Freq: Four times a day (QID) | INTRAMUSCULAR | Status: DC | PRN

## 2023-08-25 MED ORDER — DOCUSATE SODIUM 100 MG PO CAPS
100.0000 mg | ORAL_CAPSULE | Freq: Two times a day (BID) | ORAL | Status: DC
  Administered 2023-08-25 – 2023-08-26 (×3): 100 mg via ORAL
  Filled 2023-08-25 (×3): qty 1

## 2023-08-25 MED ORDER — HYDRALAZINE HCL 25 MG PO TABS
25.0000 mg | ORAL_TABLET | Freq: Two times a day (BID) | ORAL | Status: DC
  Administered 2023-08-25 – 2023-08-26 (×2): 25 mg via ORAL
  Filled 2023-08-25 (×2): qty 1

## 2023-08-25 MED ORDER — METOPROLOL TARTRATE 50 MG PO TABS
75.0000 mg | ORAL_TABLET | Freq: Two times a day (BID) | ORAL | Status: DC
  Administered 2023-08-25 – 2023-08-26 (×2): 75 mg via ORAL
  Filled 2023-08-25 (×2): qty 1

## 2023-08-25 MED ORDER — MENTHOL 3 MG MT LOZG
1.0000 | LOZENGE | OROMUCOSAL | Status: DC | PRN
Start: 2023-08-25 — End: 2023-08-26

## 2023-08-25 MED ORDER — ACETAMINOPHEN 10 MG/ML IV SOLN
1000.0000 mg | Freq: Once | INTRAVENOUS | Status: DC | PRN
  Administered 2023-08-25: 1000 mg via INTRAVENOUS

## 2023-08-25 MED ORDER — FENTANYL CITRATE (PF) 100 MCG/2ML IJ SOLN
25.0000 ug | INTRAMUSCULAR | Status: DC | PRN
  Administered 2023-08-25: 25 ug via INTRAVENOUS

## 2023-08-25 MED ORDER — TRULICITY 3 MG/0.5ML ~~LOC~~ SOPN: 3.0000 mg | PEN_INJECTOR | SUBCUTANEOUS | Status: DC

## 2023-08-25 MED ORDER — FAMOTIDINE 20 MG PO TABS
20.0000 mg | ORAL_TABLET | Freq: Every day | ORAL | Status: DC
  Administered 2023-08-25: 20 mg via ORAL
  Filled 2023-08-25: qty 1

## 2023-08-25 MED ORDER — FENTANYL CITRATE (PF) 100 MCG/2ML IJ SOLN
INTRAMUSCULAR | Status: AC
  Filled 2023-08-25: qty 2

## 2023-08-25 MED ORDER — INSULIN LISPRO (1 UNIT DIAL) 100 UNIT/ML (KWIKPEN): 25.0000 [IU] | PEN_INJECTOR | Freq: Three times a day (TID) | SUBCUTANEOUS | Status: DC

## 2023-08-25 MED ORDER — ROCURONIUM BROMIDE 10 MG/ML (PF) SYRINGE
PREFILLED_SYRINGE | INTRAVENOUS | Status: DC | PRN
  Administered 2023-08-25 (×2): 50 mg via INTRAVENOUS

## 2023-08-25 MED ORDER — OXYCODONE HCL 5 MG PO TABS
5.0000 mg | ORAL_TABLET | ORAL | Status: DC | PRN
Start: 2023-08-25 — End: 2023-08-25

## 2023-08-25 MED ORDER — DEXAMETHASONE SODIUM PHOSPHATE 10 MG/ML IJ SOLN
INTRAMUSCULAR | Status: DC | PRN
  Administered 2023-08-25: 4 mg via INTRAVENOUS

## 2023-08-25 MED ORDER — FLEET ENEMA RE ENEM: 1.0000 | ENEMA | Freq: Once | RECTAL | Status: DC | PRN

## 2023-08-25 MED ORDER — THROMBIN 5000 UNITS EX SOLR
CUTANEOUS | Status: AC
  Filled 2023-08-25: qty 5000

## 2023-08-25 MED ORDER — FENTANYL CITRATE (PF) 250 MCG/5ML IJ SOLN
INTRAMUSCULAR | Status: DC | PRN
Start: 2023-08-25 — End: 2023-08-25
  Administered 2023-08-25 (×2): 50 ug via INTRAVENOUS
  Administered 2023-08-25: 100 ug via INTRAVENOUS
  Administered 2023-08-25: 50 ug via INTRAVENOUS

## 2023-08-25 MED ORDER — INSULIN ASPART 100 UNIT/ML IJ SOLN: 0.0000 [IU] | Freq: Three times a day (TID) | INTRAMUSCULAR | Status: DC

## 2023-08-25 MED ORDER — 0.9 % SODIUM CHLORIDE (POUR BTL) OPTIME
TOPICAL | Status: DC | PRN
  Administered 2023-08-25: 1000 mL

## 2023-08-25 MED ORDER — TAMSULOSIN HCL 0.4 MG PO CAPS
0.4000 mg | ORAL_CAPSULE | Freq: Every day | ORAL | Status: DC
  Administered 2023-08-25 – 2023-08-26 (×2): 0.4 mg via ORAL
  Filled 2023-08-25 (×2): qty 1

## 2023-08-25 MED ORDER — ONDANSETRON HCL 4 MG PO TABS: 4.0000 mg | ORAL_TABLET | Freq: Four times a day (QID) | ORAL | Status: DC | PRN

## 2023-08-25 MED ORDER — BUPIVACAINE HCL (PF) 0.5 % IJ SOLN
INTRAMUSCULAR | Status: AC
  Filled 2023-08-25: qty 30

## 2023-08-25 MED ORDER — SODIUM CHLORIDE 0.9 % IV SOLN: INTRAVENOUS | Status: DC | PRN

## 2023-08-25 MED ORDER — HYDROMORPHONE HCL 1 MG/ML IJ SOLN: 0.5000 mg | INTRAMUSCULAR | Status: DC | PRN

## 2023-08-25 MED ORDER — PHENOL 1.4 % MT LIQD: 1.0000 | OROMUCOSAL | Status: DC | PRN

## 2023-08-25 MED ORDER — PROPOFOL 10 MG/ML IV BOLUS
INTRAVENOUS | Status: AC
  Filled 2023-08-25: qty 20

## 2023-08-25 MED ORDER — POLYETHYLENE GLYCOL 3350 17 G PO PACK
17.0000 g | PACK | Freq: Every day | ORAL | Status: DC | PRN
Start: 2023-08-25 — End: 2023-08-26

## 2023-08-25 MED ORDER — OXYCODONE HCL 5 MG PO TABS
10.0000 mg | ORAL_TABLET | ORAL | Status: DC | PRN
  Filled 2023-08-25: qty 2

## 2023-08-25 MED ORDER — INSULIN ASPART 100 UNIT/ML IJ SOLN
0.0000 [IU] | INTRAMUSCULAR | Status: AC | PRN
  Administered 2023-08-25: 2 [IU] via SUBCUTANEOUS

## 2023-08-25 MED ORDER — TESTOSTERONE CYPIONATE 200 MG/ML IM SOLN: 200.0000 mg | INTRAMUSCULAR | Status: DC

## 2023-08-25 MED ORDER — LIDOCAINE-EPINEPHRINE 1 %-1:100000 IJ SOLN
INTRAMUSCULAR | Status: AC
  Filled 2023-08-25: qty 1

## 2023-08-25 MED ORDER — LIDOCAINE 2% (20 MG/ML) 5 ML SYRINGE
INTRAMUSCULAR | Status: DC | PRN
  Administered 2023-08-25: 100 mg via INTRAVENOUS

## 2023-08-25 MED ORDER — ACETAMINOPHEN 10 MG/ML IV SOLN
INTRAVENOUS | Status: AC
  Filled 2023-08-25: qty 100

## 2023-08-25 MED ORDER — INSULIN ASPART 100 UNIT/ML IJ SOLN
25.0000 [IU] | Freq: Three times a day (TID) | INTRAMUSCULAR | Status: DC
  Administered 2023-08-25: 25 [IU] via SUBCUTANEOUS

## 2023-08-25 MED ORDER — PHENYLEPHRINE 80 MCG/ML (10ML) SYRINGE FOR IV PUSH (FOR BLOOD PRESSURE SUPPORT)
PREFILLED_SYRINGE | INTRAVENOUS | Status: DC | PRN
  Administered 2023-08-25: 150 ug via INTRAVENOUS
  Administered 2023-08-25: 160 ug via INTRAVENOUS

## 2023-08-25 MED ORDER — HYDROCORTISONE (PERIANAL) 2.5 % EX CREA: 1.0000 | TOPICAL_CREAM | Freq: Every day | CUTANEOUS | Status: DC | PRN

## 2023-08-25 MED ORDER — PANTOPRAZOLE SODIUM 40 MG PO TBEC
40.0000 mg | DELAYED_RELEASE_TABLET | Freq: Every day | ORAL | Status: DC
  Administered 2023-08-26: 40 mg via ORAL
  Filled 2023-08-25: qty 1

## 2023-08-25 MED ORDER — VANCOMYCIN HCL IN DEXTROSE 1-5 GM/200ML-% IV SOLN
1000.0000 mg | INTRAVENOUS | Status: AC
  Administered 2023-08-25: 1000 mg via INTRAVENOUS
  Filled 2023-08-25: qty 200

## 2023-08-25 MED ORDER — FENTANYL CITRATE (PF) 250 MCG/5ML IJ SOLN
INTRAMUSCULAR | Status: AC
  Filled 2023-08-25: qty 5

## 2023-08-25 MED ORDER — METHOCARBAMOL 500 MG PO TABS
500.0000 mg | ORAL_TABLET | Freq: Four times a day (QID) | ORAL | Status: DC | PRN
  Administered 2023-08-25 – 2023-08-26 (×3): 500 mg via ORAL
  Filled 2023-08-25 (×2): qty 1

## 2023-08-25 MED ORDER — OXYCODONE HCL 5 MG/5ML PO SOLN: 5.0000 mg | Freq: Once | ORAL | Status: DC | PRN

## 2023-08-25 MED ORDER — INSULIN ASPART 100 UNIT/ML IJ SOLN
0.0000 [IU] | Freq: Every day | INTRAMUSCULAR | Status: DC
  Administered 2023-08-25: 3 [IU] via SUBCUTANEOUS

## 2023-08-25 MED ORDER — ONDANSETRON HCL 4 MG/2ML IJ SOLN: 4.0000 mg | Freq: Once | INTRAMUSCULAR | Status: DC | PRN

## 2023-08-25 MED ORDER — TRAMADOL HCL 50 MG PO TABS
50.0000 mg | ORAL_TABLET | Freq: Four times a day (QID) | ORAL | Status: DC | PRN
  Administered 2023-08-25: 100 mg via ORAL
  Administered 2023-08-26: 50 mg via ORAL
  Filled 2023-08-25: qty 1
  Filled 2023-08-25: qty 2

## 2023-08-25 MED ORDER — VITAMIN D 25 MCG (1000 UNIT) PO TABS
5000.0000 [IU] | ORAL_TABLET | Freq: Every day | ORAL | Status: DC
  Administered 2023-08-25: 5000 [IU] via ORAL
  Filled 2023-08-25: qty 5

## 2023-08-25 MED ORDER — INSULIN DEGLUDEC 100 UNIT/ML ~~LOC~~ SOPN: 25.0000 [IU] | PEN_INJECTOR | Freq: Every day | SUBCUTANEOUS | Status: DC

## 2023-08-25 MED ORDER — PHENYLEPHRINE HCL-NACL 20-0.9 MG/250ML-% IV SOLN
INTRAVENOUS | Status: DC | PRN
  Administered 2023-08-25: 40 ug/min via INTRAVENOUS

## 2023-08-25 MED ORDER — SUGAMMADEX SODIUM 200 MG/2ML IV SOLN
INTRAVENOUS | Status: DC | PRN
  Administered 2023-08-25: 200 mg via INTRAVENOUS

## 2023-08-25 MED ORDER — THROMBIN 5000 UNITS EX SOLR: OROMUCOSAL | Status: DC | PRN

## 2023-08-25 MED ORDER — PROPOFOL 10 MG/ML IV BOLUS
INTRAVENOUS | Status: DC | PRN
  Administered 2023-08-25: 150 mg via INTRAVENOUS

## 2023-08-25 MED ORDER — SODIUM CHLORIDE 0.9% FLUSH: 3.0000 mL | INTRAVENOUS | Status: DC | PRN

## 2023-08-25 MED ORDER — BUPIVACAINE HCL 0.5 % IJ SOLN
INTRAMUSCULAR | Status: DC | PRN
  Administered 2023-08-25: 5 mL

## 2023-08-25 MED ORDER — DEXMEDETOMIDINE HCL IN NACL 80 MCG/20ML IV SOLN
INTRAVENOUS | Status: DC | PRN
  Administered 2023-08-25: 12 ug via INTRAVENOUS

## 2023-08-25 MED ORDER — MELATONIN 5 MG PO TABS
10.0000 mg | ORAL_TABLET | Freq: Every day | ORAL | Status: DC
  Administered 2023-08-25: 10 mg via ORAL
  Filled 2023-08-25: qty 2

## 2023-08-25 MED ORDER — ONDANSETRON HCL 4 MG/2ML IJ SOLN
4.0000 mg | Freq: Four times a day (QID) | INTRAMUSCULAR | Status: DC | PRN
  Administered 2023-08-25: 4 mg via INTRAVENOUS
  Filled 2023-08-25: qty 2

## 2023-08-25 SURGICAL SUPPLY — 58 items
BAG COUNTER SPONGE SURGICOUNT (BAG) ×1 IMPLANT
BASKET BONE COLLECTION (BASKET) IMPLANT
BENZOIN TINCTURE PRP APPL 2/3 (GAUZE/BANDAGES/DRESSINGS) ×1 IMPLANT
BIT DRILL NEURO 2X3.1 SFT TUCH (MISCELLANEOUS) ×1 IMPLANT
BLADE CLIPPER SURG (BLADE) IMPLANT
BUR MATCHSTICK NEURO 3.0 LAGG (BURR) ×1 IMPLANT
CANISTER SUCT 3000ML PPV (MISCELLANEOUS) ×1 IMPLANT
DEVICE ENDSKLTN IMPL 16X14X7X6 (Cage) IMPLANT
DRAPE C-ARM 42X72 X-RAY (DRAPES) ×2 IMPLANT
DRAPE HALF SHEET 40X57 (DRAPES) IMPLANT
DRAPE LAPAROTOMY 100X72 PEDS (DRAPES) ×1 IMPLANT
DRAPE MICROSCOPE SLANT 54X150 (MISCELLANEOUS) ×1 IMPLANT
DRESSING MEPILEX FLEX 4X4 (GAUZE/BANDAGES/DRESSINGS) ×1 IMPLANT
DRILL NEURO 2X3.1 SOFT TOUCH (MISCELLANEOUS) ×2
DRSG MEPILEX FLEX 4X4 (GAUZE/BANDAGES/DRESSINGS) ×1
DRSG OPSITE 4X5.5 SM (GAUZE/BANDAGES/DRESSINGS) ×2 IMPLANT
DRSG OPSITE POSTOP 4X6 (GAUZE/BANDAGES/DRESSINGS) IMPLANT
DURAPREP 26ML APPLICATOR (WOUND CARE) ×1 IMPLANT
ELECT COATED BLADE 2.86 ST (ELECTRODE) ×1 IMPLANT
ELECT REM PT RETURN 9FT ADLT (ELECTROSURGICAL) ×1
ELECTRODE REM PT RTRN 9FT ADLT (ELECTROSURGICAL) ×1 IMPLANT
ENDOSKELETON IMPLANT 16X14X7X6 (Cage) ×3 IMPLANT
EVACUATOR 1/8 PVC DRAIN (DRAIN) IMPLANT
GAUZE 4X4 16PLY ~~LOC~~+RFID DBL (SPONGE) IMPLANT
GLOVE BIO SURGEON STRL SZ7 (GLOVE) ×2 IMPLANT
GLOVE BIOGEL PI IND STRL 7.5 (GLOVE) ×2 IMPLANT
GLOVE ECLIPSE 7.5 STRL STRAW (GLOVE) ×1 IMPLANT
GOWN STRL REUS W/ TWL LRG LVL3 (GOWN DISPOSABLE) ×2 IMPLANT
GOWN STRL REUS W/ TWL XL LVL3 (GOWN DISPOSABLE) ×1 IMPLANT
GOWN STRL REUS W/TWL 2XL LVL3 (GOWN DISPOSABLE) IMPLANT
HEMOSTAT POWDER KIT SURGIFOAM (HEMOSTASIS) ×1 IMPLANT
KIT BASIN OR (CUSTOM PROCEDURE TRAY) ×1 IMPLANT
KIT TURNOVER KIT B (KITS) ×1 IMPLANT
NDL HYPO 22X1.5 SAFETY MO (MISCELLANEOUS) ×1 IMPLANT
NDL SPNL 22GX3.5 QUINCKE BK (NEEDLE) ×1 IMPLANT
NEEDLE HYPO 22X1.5 SAFETY MO (MISCELLANEOUS) ×1 IMPLANT
NEEDLE SPNL 22GX3.5 QUINCKE BK (NEEDLE) ×1 IMPLANT
NS IRRIG 1000ML POUR BTL (IV SOLUTION) ×1 IMPLANT
PACK LAMINECTOMY NEURO (CUSTOM PROCEDURE TRAY) ×1 IMPLANT
PAD ARMBOARD 7.5X6 YLW CONV (MISCELLANEOUS) ×3 IMPLANT
PIN DISTRACTION 14MM (PIN) ×2 IMPLANT
PLATE ZEVO 1LVL 19MM (Plate) IMPLANT
PLATE ZEVO 2LVL 41MM (Plate) IMPLANT
PUTTY DBF 1CC CORTICAL FIBERS (Putty) IMPLANT
SCREW 3.5 SELFDRILL 17MM VARI (Screw) IMPLANT
SCREW VA SD 3.5X16 (Screw) IMPLANT
SPIKE FLUID TRANSFER (MISCELLANEOUS) ×1 IMPLANT
SPONGE INTESTINAL PEANUT (DISPOSABLE) ×1 IMPLANT
STAPLER VISISTAT 35W (STAPLE) IMPLANT
STRIP CLOSURE SKIN 1/2X4 (GAUZE/BANDAGES/DRESSINGS) ×1 IMPLANT
SUT MNCRL AB 4-0 PS2 18 (SUTURE) ×1 IMPLANT
SUT SILK 2 0 TIES 10X30 (SUTURE) IMPLANT
SUT VIC AB 3-0 SH 8-18 (SUTURE) ×1 IMPLANT
TAPE CLOTH 3X10 TAN LF (GAUZE/BANDAGES/DRESSINGS) ×1 IMPLANT
TIP KERRISON THIN FOOTPLATE 2M (MISCELLANEOUS) ×1 IMPLANT
TOWEL GREEN STERILE (TOWEL DISPOSABLE) ×1 IMPLANT
TOWEL GREEN STERILE FF (TOWEL DISPOSABLE) ×1 IMPLANT
WATER STERILE IRR 1000ML POUR (IV SOLUTION) ×1 IMPLANT

## 2023-08-25 NOTE — Op Note (Signed)
 PREOP DIAGNOSIS: Cervical spondylitic myelopathy  POSTOP DIAGNOSIS: Cervical spondylitic myelopathy  PROCEDURE: 1. Arthrodesis C3-4, anterior interbody technique, including Discectomy for decompression of spinal cord and exiting nerve roots with foraminotomies  2. Arthrodesis, additional level C4-5 anterior interbody technique, including Discectomy for decompression of spinal cord and exiting nerve roots with foraminotomies  3. Arthrodesis, additional level C5-6 anterior interbody technique, including Discectomy for decompression of spinal cord and exiting nerve roots with foraminotomies  exiting nerve roots with foraminotomies  4. Placement of intervertebral biomechanical device C3-4 5. Placement of intervertebral biomechanical device C4-5 6. Placement of intervertebral biomechanical device C5-6 7. Placement of anterior instrumentation consisting of interbody plate and screws C3-4-5 8. Placement of anterior instrumentation/plating C5-6 9. Use of morselized bone allograft  10. Use of intraoperative microscope  SURGEON: Dr. Dorn Ned, MD  ASSISTANT:  None  ANESTHESIA: General Endotracheal  EBL: 50 ml  IMPLANTS: [employer]tr[employer] 7 mm Titan-C cage x 3 41 mm and 19 mm Zevo plates 17 mm screws x 10 DBF  SPECIMENS: None  DRAINS: Medium hemovac  COMPLICATIONS: None immediate  CONDITION: Hemodynamically stable to PACU  HISTORY: 78 yo M with CAD, hx of CEA developed progressive cervical myelopathy with functional loss.  He was found to have severe stenosis at C3-4, C4-5 and moderate-severe stenosis at C5-6 with cord impingement.   Risks, benefits, alternatives, and expected convalescence were discussed with the patient.  Risks discussed included but were not limited to bleeding, pain, infection, dysphagia, dysphonia, pseudoarthrosis, hardware failure, adjacent segment disease, CSF leak, neurologic deficits, weakness, numbness, paralysis, coma, and death. After all questions were  answered, informed consent was obtained.  PROCEDURE IN DETAIL: The patient was brought to the operating room and transferred to the operative table. After induction of general anesthesia, the patient was positioned on the operative table in the supine position with all pressure points meticulously padded. The skin of the neck was then prepped and draped in the usual sterile fashion.  After timeout was conducted, the skin was infiltrated with local anesthetic. Skin incision was then made sharply and Bovie electrocautery was used to dissect the subcutaneous tissue until the platysma was identified. The platysma was then divided and undermined. The sternocleidomastoid muscle was then identified and, utilizing natural fascial planes in the neck, the prevertebral fascia was identified and the carotid sheath was retracted laterally and the trachea and esophagus retracted medially. Again using fluoroscopy, the C3-4 disc space was identified. Bovie electrocautery was used to dissect in the subperiosteal plane and elevate the bilateral longus coli muscles. Self-retaining retractors were then placed. Caspar distraction pins were placed in the adjacent bodies to allow for gentle distraction.  At this point, the microscope was draped and brought into the field, and the remainder of the case was done under the microscope using microdissecting technique.  The disc space was incised sharply and combination of high speed drill, curettes, and rongeurs were use to initially complete a discectomy. The high-speed drill was then used to complete discectomy until the posterior annulus was identified and removed and the posterior longitudinal ligament was identified. Using a nerve hook, the PLL was elevated, and Kerrison rongeurs were used to remove the posterior longitudinal ligament and the ventral thecal sac was identified.  Using a combination of curettes and rongeurs, complete decompression of the thecal sac and exiting nerve  roots at this level was completed, and verified with easy passage of micro-nerve hook centrally and in the bilateral foramina.  Having completed our decompression, attention was turned  to placement of the intervertebral device. Trial spacers were used to select a size 7 mm graft. This graft was then filled with morcellized allograft, and inserted under live fluoroscopy.  Attention was then turned to the C4-5 level. Caspar distraction pin was placed in the adjacent body to allow for gentle distraction of the disc space.  In a similar fashion, discectomy was completed initially with curettes and rongeurs, and completed with the drill. The PLL was again identified, elevated and incised. Using Kerrison rongeurs, decompression of the spinal cord and exiting roots was completed and confirmed with a dissector. Trial spacers were used to select a 7 mm graft. This graft was then filled with morcellized allograft, and inserted under live fluoroscopy.  Attention was then turned to the C5-6 level. Caspar distraction pin was placed in the adjacent body to allow for gentle distraction of the disc space.  In a similar fashion, discectomy was completed initially with curettes and rongeurs, and completed with the drill. The PLL was again identified, elevated and incised. Using Kerrison rongeurs, decompression of the spinal cord and exiting roots was completed and confirmed with a dissector. Trial spacers were used to select a 7 mm graft. This graft was then filled with morcellized allograft, and inserted under live fluoroscopy.  Caspar pins were removed.    Given his bulky anterior osteophytosis, I elected to place an individual anterior plate at R4-3.  Using a high-speed drill, the cortex of the cervical vertebral bodies was punctured, and screws inserted in the vertebral bodies.  X-ray confirmed good position.  An anterior cervical plate was placed across the C3-4 and C4-5 interspaces for anterior fixation.  Using a  high-speed drill, the cortex of the cervical vertebral bodies was punctured, and screws inserted in the vertebral bodies. Final fluoroscopic images in AP and lateral projections were taken to confirm good hardware placement.  At this point, after all counts were verified to be correct, meticulous hemostasis was secured using a combination of bipolar electrocautery and passive hemostatics.  A medium Hemovac drain was placed in the deep cervical space and tunneled out the skin and secured with a stitch.  The platysma muscle was then closed using interrupted 3-0 Vicryl sutures, and the skin was closed with a 4-0 monocryl in subcuticular fashion followed by steri-strips. Sterile dressings were then applied and the drapes removed.  The patient tolerated the procedure well and was extubated in the room and taken to the postanesthesia care unit in stable condition.  All counts were correct at the end of the procedure.

## 2023-08-25 NOTE — Transfer of Care (Signed)
 Immediate Anesthesia Transfer of Care Note  Patient: Bradley [name]  Procedure(s) Performed: CERVICAL THREE-FOUR, CERVICAL FOUR-FIVE, CERVICAL FIVE-SIX ANTERIOR CERVICAL DECOMPRESSION/DISCECTOMY FUSION (Spine Cervical)  Patient Location: PACU  Anesthesia Type:General  Level of Consciousness: awake, alert , and oriented  Airway & Oxygen Therapy: Patient Spontanous Breathing and Patient connected to nasal cannula oxygen  Post-op Assessment: Report given to RN and Post -op Vital signs reviewed and stable  Post vital signs: Reviewed and stable  Last Vitals:  Vitals Value Taken Time  BP 141/80 08/25/23 1202  Temp 37.5 C 08/25/23 1202  Pulse 86 08/25/23 1205  Resp 12 08/25/23 1205  SpO2 91 % 08/25/23 1205  Vitals shown include unfiled device data.  Last Pain:  Vitals:   08/25/23 9361  TempSrc:   PainSc: 0-No pain         Complications: No notable events documented.

## 2023-08-25 NOTE — Anesthesia Procedure Notes (Signed)
 Procedure Name: Intubation Date/Time: 08/25/2023 8:13 AM  Performed by: Lockie Flesher, CRNAPre-anesthesia Checklist: Patient identified, Emergency Drugs available, Suction available and Patient being monitored Patient Re-evaluated:Patient Re-evaluated prior to induction Oxygen Delivery Method: Circle System Utilized Preoxygenation: Pre-oxygenation with 100% oxygen Induction Type: IV induction Ventilation: Mask ventilation without difficulty Laryngoscope Size: Glidescope and 4 Grade View: Grade I Tube type: Oral Number of attempts: 1 Airway Equipment and Method: Stylet and Oral airway Placement Confirmation: ETT inserted through vocal cords under direct vision, positive ETCO2 and breath sounds checked- equal and bilateral Secured at: 23 cm Tube secured with: Tape Dental Injury: Teeth and Oropharynx as per pre-operative assessment

## 2023-08-25 NOTE — Plan of Care (Signed)
 Problem: Education: Goal: Knowledge of General Education information will improve Description: Including pain rating scale, medication(s)/side effects and non-pharmacologic comfort measures 08/25/2023 1841 by Sherleen Flor, RN Outcome: Progressing 08/25/2023 1607 by Sherleen Flor, RN Outcome: Progressing   Problem: Health Behavior/Discharge Planning: Goal: Ability to manage health-related needs will improve 08/25/2023 1841 by Sherleen Flor, RN Outcome: Progressing 08/25/2023 1607 by Sherleen Flor, RN Outcome: Progressing   Problem: Clinical Measurements: Goal: Ability to maintain clinical measurements within normal limits will improve 08/25/2023 1841 by Sherleen Flor, RN Outcome: Progressing 08/25/2023 1607 by Sherleen Flor, RN Outcome: Progressing Goal: Will remain free from infection 08/25/2023 1841 by Sherleen Flor, RN Outcome: Progressing 08/25/2023 1607 by Sherleen Flor, RN Outcome: Progressing Goal: Diagnostic test results will improve 08/25/2023 1841 by Sherleen Flor, RN Outcome: Progressing 08/25/2023 1607 by Sherleen Flor, RN Outcome: Progressing Goal: Respiratory complications will improve 08/25/2023 1841 by Sherleen Flor, RN Outcome: Progressing 08/25/2023 1607 by Sherleen Flor, RN Outcome: Progressing Goal: Cardiovascular complication will be avoided 08/25/2023 1841 by Sherleen Flor, RN Outcome: Progressing 08/25/2023 1607 by Sherleen Flor, RN Outcome: Progressing   Problem: Activity: Goal: Risk for activity intolerance will decrease 08/25/2023 1841 by Sherleen Flor, RN Outcome: Progressing 08/25/2023 1607 by Sherleen Flor, RN Outcome: Progressing   Problem: Nutrition: Goal: Adequate nutrition will be maintained 08/25/2023 1841 by Sherleen Flor, RN Outcome: Progressing 08/25/2023 1607 by Sherleen Flor, RN Outcome: Progressing   Problem: Coping: Goal: Level of anxiety will decrease 08/25/2023 1841 by Sherleen Flor, RN Outcome: Progressing 08/25/2023 1607 by Sherleen Flor, RN Outcome: Progressing   Problem: Elimination: Goal: Will not experience complications related to bowel motility 08/25/2023 1841 by Sherleen Flor, RN Outcome: Progressing 08/25/2023 1607 by Sherleen Flor, RN Outcome: Progressing Goal: Will not experience complications related to urinary retention 08/25/2023 1841 by Sherleen Flor, RN Outcome: Progressing 08/25/2023 1607 by Sherleen Flor, RN Outcome: Progressing   Problem: Pain Management: Goal: General experience of comfort will improve 08/25/2023 1841 by Sherleen Flor, RN Outcome: Progressing 08/25/2023 1607 by Sherleen Flor, RN Outcome: Progressing   Problem: Safety: Goal: Ability to remain free from injury will improve 08/25/2023 1841 by Sherleen Flor, RN Outcome: Progressing 08/25/2023 1607 by Sherleen Flor, RN Outcome: Progressing   Problem: Skin Integrity: Goal: Risk for impaired skin integrity will decrease 08/25/2023 1841 by Sherleen Flor, RN Outcome: Progressing 08/25/2023 1607 by Sherleen Flor, RN Outcome: Progressing   Problem: Education: Goal: Ability to verbalize activity precautions or restrictions will improve 08/25/2023 1841 by Sherleen Flor, RN Outcome: Progressing 08/25/2023 1607 by Sherleen Flor, RN Outcome: Progressing Goal: Knowledge of the prescribed therapeutic regimen will improve 08/25/2023 1841 by Sherleen Flor, RN Outcome: Progressing 08/25/2023 1607 by Sherleen Flor, RN Outcome: Progressing Goal: Understanding of discharge needs will improve 08/25/2023 1841 by Sherleen Flor, RN Outcome: Progressing 08/25/2023 1607 by Sherleen Flor, RN Outcome: Progressing   Problem: Activity: Goal: Ability to avoid complications of mobility impairment will improve 08/25/2023 1841 by Sherleen Flor, RN Outcome: Progressing 08/25/2023 1607 by Sherleen Flor, RN Outcome: Progressing Goal: Ability  to tolerate increased activity will improve 08/25/2023 1841 by Sherleen Flor, RN Outcome: Progressing 08/25/2023 1607 by Sherleen Flor, RN Outcome: Progressing Goal: Will remain free from falls 08/25/2023 1841 by Sherleen Flor, RN Outcome: Progressing 08/25/2023 1607 by Sherleen Flor, RN Outcome: Progressing   Problem: Bowel/Gastric: Goal: Gastrointestinal status for postoperative course will improve 08/25/2023 1841 by Sherleen Flor, RN Outcome: Progressing 08/25/2023 1607 by Sherleen Flor, RN Outcome: Progressing   Problem: Clinical Measurements: Goal: Ability to maintain clinical measurements  within normal limits will improve 08/25/2023 1841 by Sherleen Flor, RN Outcome: Progressing 08/25/2023 1607 by Sherleen Flor, RN Outcome: Progressing Goal: Postoperative complications will be avoided or minimized 08/25/2023 1841 by Sherleen Flor, RN Outcome: Progressing 08/25/2023 1607 by Sherleen Flor, RN Outcome: Progressing Goal: Diagnostic test results will improve 08/25/2023 1841 by Sherleen Flor, RN Outcome: Progressing 08/25/2023 1607 by Sherleen Flor, RN Outcome: Progressing   Problem: Pain Management: Goal: Pain level will decrease 08/25/2023 1841 by Sherleen Flor, RN Outcome: Progressing 08/25/2023 1607 by Sherleen Flor, RN Outcome: Progressing   Problem: Skin Integrity: Goal: Will show signs of wound healing 08/25/2023 1841 by Sherleen Flor, RN Outcome: Progressing 08/25/2023 1607 by Sherleen Flor, RN Outcome: Progressing   Problem: Health Behavior/Discharge Planning: Goal: Identification of resources available to assist in meeting health care needs will improve 08/25/2023 1841 by Sherleen Flor, RN Outcome: Progressing 08/25/2023 1607 by Sherleen Flor, RN Outcome: Progressing   Problem: Bladder/Genitourinary: Goal: Urinary functional status for postoperative course will improve 08/25/2023 1841 by Sherleen Flor,  RN Outcome: Progressing 08/25/2023 1607 by Sherleen Flor, RN Outcome: Progressing

## 2023-08-25 NOTE — Plan of Care (Deleted)

## 2023-08-25 NOTE — H&P (Signed)
 CC: myelopathy  HPI:     Patient is a 78 y.o. male with CSM presents for elective surgery.  He reports worsening myelopathy symptoms with increased numbness and pain, as well as weakness in his arms.    Patient Active Problem List   Diagnosis Date Noted   Diabetic neuropathy ([name]) 06/03/2022   PAD (peripheral artery disease) ([name]) 11/30/2020   Carotid stenosis 06/01/2020   Carotid stenosis, asymptomatic 06/01/2020   Syncope 01/12/2020   Coagulation disorder ([name]) 05/25/2019   Coronary artery disease involving coronary bypass graft of native heart with angina pectoris ([name])    PAF (paroxysmal atrial fibrillation) ([name])    Type 2 diabetes mellitus with vascular disease ([name]) 05/30/2015   OSA on CPAP 05/30/2015   Abnormal stress test 05/18/2015   Diverticulosis of colon without hemorrhage 05/17/2015   Right bundle branch block 04/30/2015   Essential hypertension 04/29/2015   Hyperlipidemia 04/29/2015   DM type 2, controlled, with complication ([name]) 04/29/2015   Peripheral vascular disease ([name]) 06/08/2012   Stress fracture of metatarsal bone 02/18/2012   Acquired equinus deformity of foot 12/15/2011   Plantar fasciitis 12/15/2011   Stenosis of right internal carotid artery with cerebral infarction ([name]) 12/09/2011   Past Medical History:  Diagnosis Date   Acquired equinus deformity of foot 12/15/2011   Anemia    Arthritis    Carotid artery occlusion    a. Carotid US  10/16: RICA 60-79%, L CEA patent with 1-39% stenosis   CKD (chronic kidney disease)    stage 3-4   Coagulation disorder ([name]) 05/25/2019   Coronary artery disease    a. Myoview  9/16:  EF 52%, inferior fixed defect consistent with diaphragmatic attenuation, intermediate risk secondary to poor exercise tolerance and symptoms during stress;  b. LHC 05/17/2015 90% mid LAD, 80% ost D2, 75% mid RCA, 35% prox RCA. >> S/p CABG   Coronary artery disease involving coronary bypass graft of native heart with angina pectoris  ([name])    CORONARY ARTERY BYPASS GRAFTING x 3 - 05/23/2015 Left internal mammary artery to left anterior descending  Saphenous vein graft to diagonal  Saphenous vein graft to posterior descending Endoscopic greater saphenous vein harvest right thigh     Diabetes mellitus age 36   Diverticulitis    Diverticulosis of colon without hemorrhage 05/17/2015   By CT scan    DM type 2, controlled, with complication ([name]) 04/29/2015   Essential hypertension 04/29/2015   GERD (gastroesophageal reflux disease)    H/O hiatal hernia    History of echocardiogram    a. Echo 9/16: GLS -15.2%, EF 55-60%, grade 1 diastolic dysfunction, normal wall motion, aortic sclerosis, dilated aortic root 39 mm, atrial septal lipomatous hypertrophy   Hyperlipidemia    Hypertension    Irritable bowel syndrome (IBS)    Myocardial infarction ([name])    silent inferior MI; patient denies MI history (03/17/13)    Neuralgia    Neuropathy 2013   OSA on CPAP 05/30/2015   Osteoporosis 2013   PAF (paroxysmal atrial fibrillation) ([name])    post CABG 2016; Amiodarone  stopped 2/2 wheezing; PAF 01/12/20   Peripheral vascular disease ([name])    Plantar fasciitis 12/15/2011   Pneumonia    Reflux esophagitis    Right bundle branch block 04/30/2015   Sleep apnea    uses CPAP   Stenosis of right internal carotid artery with cerebral infarction ([name]) 12/09/2011   Stress fracture of metatarsal bone 02/18/2012   Stroke ([name]) 1987   Right brain stroke-  slight drop on left side   Syncope 01/12/2020   Type 2 diabetes mellitus with vascular disease ([name]) 05/30/2015   Wears glasses     Past Surgical History:  Procedure Laterality Date   ABDOMINAL AORTOGRAM W/LOWER EXTREMITY Bilateral 11/27/2020   Procedure: ABDOMINAL AORTOGRAM W/LOWER EXTREMITY;  Surgeon: Serene Gaile ORN, MD;  Location: [facility] INVASIVE CV LAB;  Service: Cardiovascular;  Laterality: Bilateral;   CARDIAC CATHETERIZATION N/A 05/17/2015   Procedure: Left Heart Cath and Coronary  Angiography;  Surgeon: Victory ORN Sharps, MD;  Location: [facility] INVASIVE CV LAB;  Service: Cardiovascular;  Laterality: N/A;   CAROTID ENDARTERECTOMY  1994 & redo 2001   Left   CATARACT EXTRACTION W/ INTRAOCULAR LENS IMPLANT Bilateral    COLONOSCOPY W/ BIOPSIES AND POLYPECTOMY     CORONARY ARTERY BYPASS GRAFT N/A 05/23/2015   Procedure: CORONARY ARTERY BYPASS GRAFTING (CABG) x 3 (LIMA to LAD, SVG to DIAGONAL 2, SVG to PDA) with Endoscopic Vein Harvesting from right greater saphenous vein;  Surgeon: Dallas KATHEE Jude, MD;  Location: [facility] OR;  Service: Open Heart Surgery;  Laterality: N/A;   ENDARTERECTOMY FEMORAL Right 11/30/2020   Procedure: ENDARTERECTOMY FEMORAL RIGHT;  Surgeon: Oris Krystal FALCON, MD;  Location: [facility] OR;  Service: Vascular;  Laterality: Right;   FEMORAL-POPLITEAL BYPASS GRAFT Right 11/30/2020   Procedure: BYPASS GRAFT FEMORAL-POPLITEAL ARTERY RIGHT;  Surgeon: Oris Krystal FALCON, MD;  Location: [facility] OR;  Service: Vascular;  Laterality: Right;   FRACTURE SURGERY  2013   Right   foo  t X's 2   ILIAC ARTERY STENT     LAPAROSCOPIC CHOLECYSTECTOMY  09/11/2016   POSTERIOR LUMBAR FUSION  03/24/2013   Level 1   PR VEIN BYPASS GRAFT,AORTO-FEM-POP  1998   SPINE SURGERY  2014   TEE WITHOUT CARDIOVERSION N/A 05/23/2015   Procedure: TRANSESOPHAGEAL ECHOCARDIOGRAM (TEE);  Surgeon: Dallas KATHEE Jude, MD;  Location: [facility] OR;  Service: Open Heart Surgery;  Laterality: N/A;   TONSILLECTOMY     TRANSCAROTID ARTERY REVASCULARIZATION  Right 06/01/2020   Procedure: RIGHT TRANSCAROTID ARTERY REVASCULARIZATION;  Surgeon: Serene Gaile ORN, MD;  Location: [facility] OR;  Service: Vascular;  Laterality: Right;   TRIGGER FINGER RELEASE Right 07/14/2017   Procedure: RIGHT LONG FINGER TRIGGER RELEASE;  Surgeon: Sebastian Lenis, MD;  Location: [facility];  Service: Orthopedics;  Laterality: Right;    Medications Prior to Admission  Medication Sig Dispense Refill Last Dose/Taking   amLODipine  (NORVASC ) 10 MG tablet  Take 1 tablet (10 mg total) by mouth daily. 90 tablet 3 08/25/2023 at  4:15 AM   apixaban  (ELIQUIS ) 5 MG TABS tablet Take 1 tablet (5 mg total) by mouth 2 (two) times daily. 180 tablet 1 08/21/2023   aspirin  EC 81 MG tablet Take 81 mg by mouth daily. Swallow whole.   08/18/2023   atorvastatin  (LIPITOR) 40 MG tablet Take 40 mg by mouth daily.   08/25/2023 at  4:15 AM   Cholecalciferol  (VITAMIN D3) 125 MCG (5000 UT) TABS Take 5,000 Units by mouth daily.    Past Week   dexlansoprazole (DEXILANT) 60 MG capsule Take 60 mg by mouth daily.    08/25/2023 at  4:15 AM   Dulaglutide  (TRULICITY ) 3 MG/0.5ML SOPN Inject 3 mg into the skin every Sunday.   08/16/2023   famotidine  (PEPCID ) 20 MG tablet Take 20 mg by mouth at bedtime.   08/24/2023 Bedtime   gabapentin  (NEURONTIN ) 300 MG capsule Take 600 mg by mouth at bedtime.    08/24/2023 Bedtime  HUMALOG  KWIKPEN 100 UNIT/ML KwikPen Inject 25-30 Units into the skin 3 (three) times daily.   08/24/2023   hydrALAZINE  (APRESOLINE ) 25 MG tablet Take 1 tablet (25 mg total) by mouth in the morning and at bedtime. 180 tablet 2 08/25/2023 at  4:15 AM   ketoconazole  (NIZORAL ) 2 % shampoo Apply 1 application topically daily as needed for irritation.    Past Week   Melatonin 5 MG CHEW Chew 10 mg by mouth at bedtime.   08/24/2023   metoprolol  tartrate (LOPRESSOR ) 50 MG tablet TAKE 1 AND 1/2 TABLETS BY MOUTH TWICE DAILY 270 tablet 0 08/25/2023 at  4:15 AM   nitroGLYCERIN  (NITROSTAT ) 0.3 MG SL tablet Place 1 tablet (0.3 mg total) under the tongue every 5 (five) minutes as needed for chest pain. Only as needed 90 tablet 0 Past Week   testosterone  cypionate (DEPOTESTOSTERONE CYPIONATE) 200 MG/ML injection Inject 200 mg into the muscle once a week.   08/22/2023   torsemide  (DEMADEX ) 20 MG tablet Take 1 tablet (20 mg total) by mouth daily. 90 tablet 3 08/25/2023 Morning   TRESIBA  FLEXTOUCH 100 UNIT/ML SOPN FlexTouch Pen Inject 25 Units into the skin daily.   08/21/2023   hydrocortisone  (ANUSOL -HC)  2.5 % rectal cream Place 1 application rectally daily as needed for hemorrhoids.    Unknown   methocarbamol  (ROBAXIN ) 500 MG tablet Take 500 mg by mouth every 6 (six) hours as needed for muscle spasms.    Unknown   Allergies  Allergen Reactions   Doxazosin Mesylate     Other reaction(s): Diarrhea, dizziness   Hydrocodone Nausea Only   Niacin     Other reaction(s): Skin irritation   Oxycodone  Nausea Only   Penicillins Rash    Has patient had a PCN reaction causing immediate rash, facial/tongue/throat swelling, SOB or lightheadedness with hypotension: NO Has patient had a PCN reaction causing severe rash involving mucus membranes or skin necrosis:NO Has patient had a PCN reaction that required hospitalization NO Has patient had a PCN reaction occurring within the last 10 years: NO If all of the above answers are NO, then may proceed with Cephalosporin use.    Sulfa Drugs Cross Reactors Rash    Social History   Tobacco Use   Smoking status: Former    Current packs/day: 0.00    Average packs/day: 2.0 packs/day for 50.0 years (100.0 ttl pk-yrs)    Types: Cigarettes    Start date: 03/27/1959    Quit date: 03/26/2009    Years since quitting: 14.4   Smokeless tobacco: Never  Substance Use Topics   Alcohol use: No    Family History  Problem Relation Age of Onset   Cancer Mother 64       pancreatic   Hyperlipidemia Mother    Stroke Father 24   Deep vein thrombosis Father    Hyperlipidemia Father    Hypertension Father      Review of Systems Pertinent items are noted in HPI.  Objective:   Patient Vitals for the past 8 hrs:  BP Temp Temp src Pulse Resp SpO2 Height Weight  08/25/23 0558 138/81 97.6 F (36.4 C) Oral 83 18 98 % 5' 10.5 (1.791 m) 97.5 kg   No intake/output data recorded. No intake/output data recorded.      General : Alert, cooperative, no distress, appears stated age   Head:  Normocephalic/atraumatic    Eyes: PERRL, conjunctiva/corneas clear, EOM's  intact. Fundi could not be visualized Neck: Supple Chest:  Respirations unlabored Chest wall:  no tenderness or deformity Heart: Regular rate and rhythm Abdomen: Soft, nontender and nondistended Extremities: warm and well-perfused Skin: normal turgor, color and texture Neurologic:  Alert, oriented x 3.  Eyes open spontaneously. PERRL, EOMI, VFC, no facial droop. V1-3 intact.  No dysarthria, tongue protrusion symmetric.  CNII-XII intact. + Hoffman's,, + Spurling's bilaterally       Data ReviewCBC:  Lab Results  Component Value Date   WBC 7.2 12/01/2020   RBC 3.94 (L) 12/01/2020   BMP:  Lab Results  Component Value Date   GLUCOSE 161 (H) 12/02/2020   CO2 24 12/02/2020   BUN 12 12/02/2020   BUN 21 02/01/2020   CREATININE 0.81 12/02/2020   CREATININE 1.76 (H) 09/19/2015   CALCIUM  8.9 12/02/2020   Radiology review: see clinic note for details  Assessment:   Active Problems:   * No active hospital problems. *  CSM  Plan:   - ACDF today - Risks, benefits, alternatives, and expected convalescence were discussed with the patient.  Risks discussed included, but were not limited to bleeding, pain, infection, dysphagia, dysphonia, scar, spinal fluid leak, neurologic deficit, instability, pseudoarthrosis, adjacent segment disease, damage to nearby organs, and death.  Informed consent was obtained.

## 2023-08-25 NOTE — Progress Notes (Signed)
 Orthopedic Tech Progress Note Patient Details:  Bradley [name] 1946/03/10 [id number]  PACU RN called requesting an A HARD COLLAR, so I called in order to [employer] for an ASPEN CERVICAL COLLAR   Patient ID: Bradley [name], male   DOB: [birth year], 78 y.o.   MRN: [id number]  [name] Pac 08/25/2023, 12:47 PM

## 2023-08-25 NOTE — Progress Notes (Signed)
 Pt CBG 254 today. Pt reports that his CBG has been in the 200s the past few days. Pt reports that he did take his Humalog  yesterday, but has not had his Tresiba  since 08/20/22 (the same day he stopped his Eliquis ). Pt started on insulin  pre-op protocol per Dr. Keneth and given 3 units Novolog .   Pt states that he used Nitroglycerin  about a week ago for some pressure in his chest. Pt states that he uses this for any chest discomfort to be safe. Pt reports that this pain was reflux, and the nitroglycerin  did not help. Pt denies other symptoms. Pt states that he did take his torsemide  today. Dr Keneth notified.   CRNA aware.

## 2023-08-25 NOTE — Progress Notes (Signed)
   08/25/23 2023  BiPAP/CPAP/SIPAP  BiPAP/CPAP/SIPAP Pt Type Adult  BiPAP/CPAP/SIPAP Resmed  Mask Type  (pt. has his own mask from home.)  IPAP 18 cmH20 (max 18)  EPAP 8 cmH2O (min 8)  Patient Home Equipment No  Auto Titrate Yes

## 2023-08-26 LAB — GLUCOSE, CAPILLARY: Glucose-Capillary: 220 mg/dL — ABNORMAL HIGH (ref 70–99)

## 2023-08-26 MED ORDER — INSULIN ASPART 100 UNIT/ML IJ SOLN
25.0000 [IU] | Freq: Three times a day (TID) | INTRAMUSCULAR | Status: DC
  Administered 2023-08-26: 15 [IU] via SUBCUTANEOUS

## 2023-08-26 MED ORDER — TRAMADOL HCL 50 MG PO TABS: 50.0000 mg | ORAL_TABLET | Freq: Four times a day (QID) | ORAL | 0 refills | Status: AC | PRN

## 2023-08-26 MED ORDER — DOCUSATE SODIUM 100 MG PO CAPS: 100.0000 mg | ORAL_CAPSULE | Freq: Two times a day (BID) | ORAL | 0 refills | Status: DC

## 2023-08-26 NOTE — Anesthesia Postprocedure Evaluation (Signed)
 Anesthesia Post Note  Patient: Bradley [name]  Procedure(s) Performed: CERVICAL THREE-FOUR, CERVICAL FOUR-FIVE, CERVICAL FIVE-SIX ANTERIOR CERVICAL DECOMPRESSION/DISCECTOMY FUSION (Spine Cervical)     Patient location during evaluation: PACU Anesthesia Type: General Level of consciousness: awake and alert Pain management: pain level controlled Vital Signs Assessment: post-procedure vital signs reviewed and stable Respiratory status: spontaneous breathing, nonlabored ventilation, respiratory function stable and patient connected to nasal cannula oxygen Cardiovascular status: stable and blood pressure returned to baseline Anesthetic complications: no   No notable events documented.  Last Vitals:  Vitals:   08/26/23 0511 08/26/23 0751  BP: 132/80 123/77  Pulse: 86 83  Resp: 20 18  Temp: 36.4 C 36.9 C  SpO2: 91%     Last Pain:  Vitals:   08/26/23 0751  TempSrc: Oral  PainSc:                  Debby FORBES Like

## 2023-08-26 NOTE — Progress Notes (Signed)
 Pt provided choice for HH under [email or web address]. He asked to think about it and call CM back with their choice from home. Pt called and asked to use [facility]. Gasper Karst accepted. Gasper Karst will contact him for the first home visit.

## 2023-08-26 NOTE — Evaluation (Signed)
 Physical Therapy Evaluation Patient Details Name: Bradley [name] MRN: [id number] DOB: 05-Jan-1946 Today's Date: 08/26/2023  History of Present Illness  Bradley [name] is a 78 yo male who underwent ACDF 3-6 1/14. PMH includes arthritis, CKD, CAD, DM, GERD, HLD, IBS, MI, PAF, RBBB, CVA, syncope.   Clinical Impression  Pt admitted with above. At baseline pt uses 3 wheeled walker in home and has short shuffled gait pattern which is near baseline. Pt with good home set up and support however would benefit from HHPT to address impaired balance and stability with ambulation to minimize fall risk. Acute PT to cont to follow.        If plan is discharge home, recommend the following: A little help with walking and/or transfers;A little help with bathing/dressing/bathroom;Help with stairs or ramp for entrance   Can travel by private vehicle        Equipment Recommendations None recommended by PT  Recommendations for Other Services       Functional Status Assessment Patient has had a recent decline in their functional status and demonstrates the ability to make significant improvements in function in a reasonable and predictable amount of time.     Precautions / Restrictions Precautions Precautions: Fall;Cervical Precaution Booklet Issued: Yes (comment) Required Braces or Orthoses: Cervical Brace Cervical Brace: Hard collar;At all times Restrictions Weight Bearing Restrictions Per Provider Order: No      Mobility  Bed Mobility               General bed mobility comments: pt received sitting up in chair and returned to chair. PT demonstrated sit to sidelying to  achieve sidelying sleep position and how to position pillows under the head    Transfers Overall transfer level: Needs assistance Equipment used: Rollator (4 wheels) Transfers: Sit to/from Stand Sit to Stand: Contact guard assist           General transfer comment: verbal cues for hand placement, pt with good  walker management and locking it    Ambulation/Gait Ambulation/Gait assistance: Contact guard assist Gait Distance (Feet): 160 Feet Assistive device: Rollator (4 wheels) Gait Pattern/deviations: Step-through pattern, Decreased stride length, Trunk flexed Gait velocity: dec     General Gait Details: pt with short, ataxic like gait pattern with decreased step height. Per patient "this is how I walk" rollator too short however pt reports "I like it like this." Pt educated on having rollator at appropriate height would all for optimal support and decreased trunk flexion  Stairs Stairs: Yes Stairs assistance: Min assist Stair Management: One rail Left, Step to pattern Number of Stairs: 1 (to mimic home) General stair comments: educated on not pulling self up with door frame and pushing into son or wifes hand to ascend step into home. Pt with good walker management of locking brakes when stepping down. minA to control descent and to steady during ascending  Wheelchair Mobility     Tilt Bed    Modified Rankin (Stroke Patients Only)       Balance Overall balance assessment: Mild deficits observed, not formally tested (requires use of AD for ambulation)                                           Pertinent Vitals/Pain Pain Assessment Pain Assessment: Faces Faces Pain Scale: Hurts little more Pain Location: throat and neck Pain Descriptors / Indicators: Discomfort Pain  Intervention(s): Monitored during session    Home Living Family/patient expects to be discharged to:: Private residence Living Arrangements: Spouse/significant other Available Help at Discharge: Family;Available 24 hours/day Type of Home: House Home Access: Stairs to enter   [employer] of Steps: 1   Home Layout: Two level;Able to live on main level with bedroom/bathroom Home Equipment: Rolling Walker (2 wheels);Rollator (4 wheels);Cane - single point;Shower seat;Grab bars -  tub/shower (3 wheeled walker)      Prior Function Prior Level of Function : Independent/Modified Independent             Mobility Comments: AD for all mobility ADLs Comments: mod I     Extremity/Trunk Assessment   Upper Extremity Assessment Upper Extremity Assessment: Defer to OT evaluation RUE Deficits / Details: thumb numbness but imporved from pre-op RUE Sensation: WNL    Lower Extremity Assessment Lower Extremity Assessment: Generalized weakness    Cervical / Trunk Assessment Cervical / Trunk Assessment: Neck Surgery  Communication   Communication Communication: No apparent difficulties  Cognition Arousal: Alert Behavior During Therapy: WFL for tasks assessed/performed Overall Cognitive Status: Within Functional Limits for tasks assessed                                 General Comments: some decresaed insight to safety however suspect this to be baseline        General Comments General comments (skin integrity, edema, etc.): VSS    Exercises     Assessment/Plan    PT Assessment Patient needs continued PT services  PT Problem List Decreased strength;Decreased activity tolerance;Decreased mobility;Decreased balance       PT Treatment Interventions DME instruction;Gait training;Stair training;Functional mobility training;Therapeutic activities;Therapeutic exercise    PT Goals (Current goals can be found in the Care Plan section)  Acute Rehab PT Goals Patient Stated Goal: home today PT Goal Formulation: With patient Time For Goal Achievement: 09/09/23 Potential to Achieve Goals: Good    Frequency Min 1X/week     Co-evaluation               AM-PAC PT "6 Clicks" Mobility  Outcome Measure Help needed turning from your back to your side while in a flat bed without using bedrails?: A Little Help needed moving from lying on your back to sitting on the side of a flat bed without using bedrails?: A Little Help needed moving to and  from a bed to a chair (including a wheelchair)?: A Little Help needed standing up from a chair using your arms (e.g., wheelchair or bedside chair)?: A Little Help needed to walk in hospital room?: A Little Help needed climbing 3-5 steps with a railing? : A Little 6 Click Score: 18    End of Session Equipment Utilized During Treatment: Cervical collar;Gait belt Activity Tolerance: Patient tolerated treatment well Patient left: in chair;with call bell/phone within reach Nurse Communication: Mobility status PT Visit Diagnosis: Unsteadiness on feet (R26.81)    Time: 1191-4782 PT Time Calculation (min) (ACUTE ONLY): 16 min   Charges:   PT Evaluation $PT Eval Low Complexity: 1 Low   PT General Charges $$ ACUTE PT VISIT: 1 Visit         Renaee Caro, PT, DPT Acute Rehabilitation Services Secure chat preferred Office #: [phone]   Jenna Moan 08/26/2023, 11:02 AM

## 2023-08-26 NOTE — Discharge Summary (Signed)
 Patient ID: Bradley [name] MRN: [id number] DOB/AGE: [birth year] 78 y.o.  Admit date: 08/25/2023 Discharge date: 08/26/2023  Admission Diagnoses: Cervical myelopathy [facility]) [G95.9]   Discharge Diagnoses: Same   Discharged Condition: Stable  Hospital Course:  Bradley [name] is a 78 y.o. male who was admitted following an uncomplicated ACDF C3-6. They were recovered in PACU and transferred to [facility]. Hospital course was uncomplicated. Pt stable for discharge today. Pt to f/u in office for routine post op visit. Pt is in agreement w/ plan.    Discharge Exam: Blood pressure 123/77, pulse 83, temperature 98.4 F (36.9 C), temperature source Oral, resp. rate 18, height 5' 10.5" (1.791 m), weight 97.5 kg, SpO2 91%. A&O x3 Speech fluent, appropriate Strength grossly intact BUE/BLE.  SILTx4.  Dressing c/d/I.   Disposition: Discharge disposition: 01-Home or Self Care       Discharge Instructions     Incentive spirometry RT   Complete by: As directed       Allergies as of 08/26/2023       Reactions   Doxazosin Mesylate    Other reaction(s): Diarrhea, dizziness   Hydrocodone Nausea Only   Niacin    Other reaction(s): Skin irritation   Oxycodone  Nausea Only   Penicillins Rash   Has patient had a PCN reaction causing immediate rash, facial/tongue/throat swelling, SOB or lightheadedness with hypotension: NO Has patient had a PCN reaction causing severe rash involving mucus membranes or skin necrosis:NO Has patient had a PCN reaction that required hospitalization NO Has patient had a PCN reaction occurring within the last 10 years: NO If all of the above answers are "NO", then may proceed with Cephalosporin use.   Sulfa Drugs Cross Reactors Rash        Medication List     PAUSE taking these medications    apixaban  5 MG Tabs tablet Wait to take this until: September 01, 2023 Commonly known as: ELIQUIS  Take 1 tablet (5 mg total) by mouth 2 (two) times daily.   aspirin  EC 81  MG tablet Wait to take this until: September 01, 2023 Take 81 mg by mouth daily. Swallow whole.       TAKE these medications    amLODipine  10 MG tablet Commonly known as: NORVASC  Take 1 tablet (10 mg total) by mouth daily.   atorvastatin  40 MG tablet Commonly known as: LIPITOR Take 40 mg by mouth daily.   dexlansoprazole 60 MG capsule Commonly known as: DEXILANT Take 60 mg by mouth daily.   docusate sodium  100 MG capsule Commonly known as: COLACE Take 1 capsule (100 mg total) by mouth 2 (two) times daily.   famotidine  20 MG tablet Commonly known as: PEPCID  Take 20 mg by mouth at bedtime.   gabapentin  300 MG capsule Commonly known as: NEURONTIN  Take 600 mg by mouth at bedtime.   HumaLOG  KwikPen 100 UNIT/ML KwikPen Generic drug: insulin  lispro Inject 25-30 Units into the skin 3 (three) times daily.   hydrALAZINE  25 MG tablet Commonly known as: APRESOLINE  Take 1 tablet (25 mg total) by mouth in the morning and at bedtime.   hydrocortisone  2.5 % rectal cream Commonly known as: ANUSOL -HC Place 1 application rectally daily as needed for hemorrhoids.   ketoconazole  2 % shampoo Commonly known as: NIZORAL  Apply 1 application topically daily as needed for irritation.   Melatonin 5 MG Chew Chew 10 mg by mouth at bedtime.   methocarbamol  500 MG tablet Commonly known as: ROBAXIN  Take 500 mg by mouth every 6 (six)  hours as needed for muscle spasms.   metoprolol  tartrate 50 MG tablet Commonly known as: LOPRESSOR  TAKE 1 AND 1/2 TABLETS BY MOUTH TWICE DAILY   nitroGLYCERIN  0.3 MG SL tablet Commonly known as: NITROSTAT  Place 1 tablet (0.3 mg total) under the tongue every 5 (five) minutes as needed for chest pain. Only as needed   testosterone  cypionate 200 MG/ML injection Commonly known as: DEPOTESTOSTERONE CYPIONATE Inject 200 mg into the muscle once a week.   torsemide  20 MG tablet Commonly known as: DEMADEX  Take 1 tablet (20 mg total) by mouth daily.   traMADol   50 MG tablet Commonly known as: ULTRAM  Take 1-2 tablets (50-100 mg total) by mouth every 6 (six) hours as needed for moderate pain (pain score 4-6) or severe pain (pain score 7-10).   Tresiba  FlexTouch 100 UNIT/ML FlexTouch Pen Generic drug: insulin  degludec Inject 25 Units into the skin daily.   Trulicity  3 MG/0.5ML Sopn Inject 3 mg into the skin every Sunday.   Vitamin D3 125 MCG (5000 UT) Tabs Take 5,000 Units by mouth daily.         Signed: Quill Grinder [name] 08/26/2023, 8:58 AM

## 2023-08-26 NOTE — Plan of Care (Signed)
  Problem: Education: Goal: Knowledge of General Education information will improve Description: Including pain rating scale, medication(s)/side effects and non-pharmacologic comfort measures Outcome: Completed/Met   Problem: Health Behavior/Discharge Planning: Goal: Ability to manage health-related needs will improve Outcome: Completed/Met   Problem: Clinical Measurements: Goal: Ability to maintain clinical measurements within normal limits will improve Outcome: Completed/Met Goal: Will remain free from infection Outcome: Completed/Met Goal: Diagnostic test results will improve Outcome: Completed/Met Goal: Respiratory complications will improve Outcome: Completed/Met Goal: Cardiovascular complication will be avoided Outcome: Completed/Met   Problem: Activity: Goal: Risk for activity intolerance will decrease Outcome: Completed/Met   Problem: Nutrition: Goal: Adequate nutrition will be maintained Outcome: Completed/Met   Problem: Coping: Goal: Level of anxiety will decrease Outcome: Completed/Met   Problem: Elimination: Goal: Will not experience complications related to bowel motility Outcome: Completed/Met Goal: Will not experience complications related to urinary retention Outcome: Completed/Met   Problem: Pain Management: Goal: General experience of comfort will improve Outcome: Completed/Met   Problem: Safety: Goal: Ability to remain free from injury will improve Outcome: Completed/Met   Problem: Skin Integrity: Goal: Risk for impaired skin integrity will decrease Outcome: Completed/Met   Problem: Education: Goal: Ability to verbalize activity precautions or restrictions will improve Outcome: Completed/Met Goal: Knowledge of the prescribed therapeutic regimen will improve Outcome: Completed/Met Goal: Understanding of discharge needs will improve Outcome: Completed/Met   Problem: Activity: Goal: Ability to avoid complications of mobility impairment will  improve Outcome: Completed/Met Goal: Ability to tolerate increased activity will improve Outcome: Completed/Met Goal: Will remain free from falls Outcome: Completed/Met   Problem: Bowel/Gastric: Goal: Gastrointestinal status for postoperative course will improve Outcome: Completed/Met   Problem: Clinical Measurements: Goal: Ability to maintain clinical measurements within normal limits will improve Outcome: Completed/Met Goal: Postoperative complications will be avoided or minimized Outcome: Completed/Met Goal: Diagnostic test results will improve Outcome: Completed/Met   Problem: Pain Management: Goal: Pain level will decrease Outcome: Completed/Met   Problem: Skin Integrity: Goal: Will show signs of wound healing Outcome: Completed/Met   Problem: Health Behavior/Discharge Planning: Goal: Identification of resources available to assist in meeting health care needs will improve Outcome: Completed/Met   Problem: Bladder/Genitourinary: Goal: Urinary functional status for postoperative course will improve Outcome: Completed/Met

## 2023-08-26 NOTE — Progress Notes (Signed)
 Patient alert and oriented, voiding adequately, skin clean, dry and intact without evidence of skin break down, or symptoms of complications - no redness or edema noted, only slight tenderness at site.  Patient states pain is manageable at time of discharge. Room was checked and accounted for all patient's belongings; discharge instructions concerning his medications, incision care, follow up appointment and when to call the doctor as needed were all discussed with patient by RN and he expressed understanding on the instructions given

## 2023-08-26 NOTE — Evaluation (Signed)
 Occupational Therapy Evaluation Patient Details Name: Bradley [name] MRN: [id number] DOB: [birth year] Today's Date: 08/26/2023   History of Present Illness Bradley [name] is a 78 yo male who underwent ACDF 3-6 1/14. PMH includes arthritis, CKD, CAD, DM, GERD, HLD, IBS, MI, PAF, RBBB, CVA, syncope.   Clinical Impression   Bradley [name] was evaluated s/p the above admission list. He is mod I with use of AD at baseline and lives with family who can assist at discharge. Upon evaluation the pt was limited by surgical pain, cervical precautions, unsteady gait, generalized weakness and limited activity tolerance. Overall he needed CGA for all mobility and ADLs with cues for compensatory techniques. Pt will benefit from continued acute OT services and discharge home with support of family.        If plan is discharge home, recommend the following: A little help with walking and/or transfers;A little help with bathing/dressing/bathroom;Assistance with cooking/housework;Assist for transportation    Functional Status Assessment  Patient has had a recent decline in their functional status and demonstrates the ability to make significant improvements in function in a reasonable and predictable amount of time.  Equipment Recommendations  None recommended by OT       Precautions / Restrictions Precautions Precautions: Fall;Cervical Precaution Booklet Issued: Yes (comment) Required Braces or Orthoses: Cervical Brace Cervical Brace: Hard collar;At all times Restrictions Weight Bearing Restrictions Per Provider Order: No      Mobility Bed Mobility Overal bed mobility: Needs Assistance Bed Mobility: Rolling, Sidelying to Sit Rolling: Contact guard assist Sidelying to sit: Contact guard assist            Transfers Overall transfer level: Needs assistance Equipment used: Rolling walker (2 wheels) Transfers: Sit to/from Stand Sit to Stand: Contact guard assist                  Balance  Overall balance assessment: Mild deficits observed, not formally tested                 ADL either performed or assessed with clinical judgement   ADL Overall ADL's : Needs assistance/impaired Eating/Feeding: Independent   Grooming: Contact guard assist;Standing   Upper Body Bathing: Set up;Sitting   Lower Body Bathing: Contact guard assist;Sit to/from stand   Upper Body Dressing : Set up;Sitting   Lower Body Dressing: Contact guard assist;Sit to/from stand   Toilet Transfer: Contact guard assist;Ambulation   Toileting- Clothing Manipulation and Hygiene: Contact guard assist       Functional mobility during ADLs: Contact guard assist General ADL Comments: cues for cervical precautions, pt able to get into figure four position for lower body dressing     Vision Baseline Vision/History: 0 No visual deficits Vision Assessment?: No apparent visual deficits     Perception Perception: Not tested       Praxis Praxis: Not tested       Pertinent Vitals/Pain Pain Assessment Pain Assessment: Faces Faces Pain Scale: Hurts little more Pain Location: throat and neck Pain Descriptors / Indicators: Discomfort Pain Intervention(s): Limited activity within patient's tolerance, Monitored during session     Extremity/Trunk Assessment Upper Extremity Assessment Upper Extremity Assessment: RUE deficits/detail RUE Deficits / Details: thumb numbness but imporved from pre-op RUE Sensation: WNL   Lower Extremity Assessment Lower Extremity Assessment: Defer to PT evaluation   Cervical / Trunk Assessment Cervical / Trunk Assessment: Neck Surgery   Communication Communication Communication: No apparent difficulties   Cognition Arousal: Alert Behavior During Therapy: [facility] for tasks assessed/performed Overall  Cognitive Status: Within Functional Limits for tasks assessed                       General Comments  VSS            Home Living Family/patient expects  to be discharged to:: Private residence Living Arrangements: Spouse/significant other Available Help at Discharge: Family;Available 24 hours/day Type of Home: House Home Access: Stairs to enter [employer] of Steps: 1   Home Layout: Two level;Able to live on main level with bedroom/bathroom     Bathroom Shower/Tub: Producer, television/film/video: Standard     Home Equipment: Agricultural consultant (2 wheels);Rollator (4 wheels);Cane - single point;Shower seat;Grab bars - tub/shower (3 wheeled walker)          Prior Functioning/Environment Prior Level of Function : Independent/Modified Independent             Mobility Comments: AD for all mobility ADLs Comments: mod I        OT Problem List: Decreased activity tolerance;Impaired balance (sitting and/or standing);Decreased knowledge of precautions      OT Treatment/Interventions: Therapeutic exercise;Self-care/ADL training;DME and/or AE instruction;Therapeutic activities;Patient/family education;Balance training    OT Goals(Current goals can be found in the care plan section) Acute Rehab OT Goals Patient Stated Goal: home today OT Goal Formulation: With patient Time For Goal Achievement: 09/09/23 Potential to Achieve Goals: Good ADL Goals Additional ADL Goal #1: Pt will complete all ADLs with mod I Additional ADL Goal #2: Pt will indep maintain all spinal precautions throughout functional session  OT Frequency: Min 1X/week       AM-PAC OT "6 Clicks" Daily Activity     Outcome Measure Help from another person eating meals?: None Help from another person taking care of personal grooming?: A Little Help from another person toileting, which includes using toliet, bedpan, or urinal?: A Little Help from another person bathing (including washing, rinsing, drying)?: A Little Help from another person to put on and taking off regular upper body clothing?: A Little Help from another person to put on and taking off  regular lower body clothing?: A Little 6 Click Score: 19   End of Session Equipment Utilized During Treatment: Rolling walker (2 wheels);Cervical collar Nurse Communication: Mobility status  Activity Tolerance: Patient tolerated treatment well Patient left: in chair;with call bell/phone within reach  OT Visit Diagnosis: Other abnormalities of gait and mobility (R26.89);Muscle weakness (generalized) (M62.81)                Time: 2841-3244 OT Time Calculation (min): 18 min Charges:  OT General Charges $OT Visit: 1 Visit OT Evaluation $OT Eval Moderate Complexity: 1 Mod  Bradley [name], OTR/L Acute Rehabilitation Services Office [phone] Secure Chat Communication Preferred   Bradley [name] 08/26/2023, 10:09 AM

## 2023-08-26 NOTE — Discharge Instructions (Addendum)
  Wound Care Keep incision covered and dry until post op day 3. You may remove the Honeycomb dressing on post op day 3. Leave steri-strips on neck.  They will fall off by themselves. Do not put any creams, lotions, or ointments on incision. You are fine to shower. Let water  run over incision and pat dry.  Activity Walk each and every day, increasing distance each day. No lifting greater than 8 lbs.  Avoid excessive neck motion. No driving,  you can ride as a passenger  Diet Resume your normal diet.    Call Your Doctor If Any of These Occur Redness, drainage, or swelling at the wound.  Temperature greater than 101 degrees. Severe pain not relieved by pain medication. Incision starts to come apart.  Follow Up Appt Call [phone] if you have one or any problem.

## 2023-08-27 MED FILL — Thrombin For Soln 5000 Unit: CUTANEOUS | Qty: 5000 | Status: AC

## 2023-08-31 ENCOUNTER — Encounter (HOSPITAL_COMMUNITY): Payer: Self-pay | Admitting: Neurosurgery

## 2023-10-05 ENCOUNTER — Telehealth: Payer: Self-pay

## 2023-10-05 NOTE — Telephone Encounter (Signed)
 Appointment: -pt called to book appt with Dr. Lenell Antu but could not get appt until 12/08/23.   -returned call to pt and LM

## 2023-10-21 ENCOUNTER — Other Ambulatory Visit: Payer: Self-pay

## 2023-10-21 DIAGNOSIS — I6523 Occlusion and stenosis of bilateral carotid arteries: Secondary | ICD-10-CM

## 2023-10-21 DIAGNOSIS — I739 Peripheral vascular disease, unspecified: Secondary | ICD-10-CM

## 2023-10-26 DIAGNOSIS — M4712 Other spondylosis with myelopathy, cervical region: Secondary | ICD-10-CM | POA: Diagnosis not present

## 2023-10-29 ENCOUNTER — Ambulatory Visit (HOSPITAL_COMMUNITY)
Admission: RE | Admit: 2023-10-29 | Discharge: 2023-10-29 | Disposition: A | Discharge status: Home / Self Care | Payer: PPO | Source: Ambulatory Visit | Attending: Vascular Surgery | Admitting: Vascular Surgery

## 2023-10-29 ENCOUNTER — Ambulatory Visit (INDEPENDENT_AMBULATORY_CARE_PROVIDER_SITE_OTHER)
Admission: RE | Admit: 2023-10-29 | Discharge: 2023-10-29 | Disposition: A | Discharge status: Home / Self Care | Payer: PPO | Source: Ambulatory Visit | Attending: Vascular Surgery | Admitting: Vascular Surgery

## 2023-10-29 DIAGNOSIS — I6523 Occlusion and stenosis of bilateral carotid arteries: Secondary | ICD-10-CM | POA: Insufficient documentation

## 2023-10-29 DIAGNOSIS — I739 Peripheral vascular disease, unspecified: Secondary | ICD-10-CM | POA: Insufficient documentation

## 2023-10-29 LAB — VAS US ABI WITH/WO TBI
Left ABI: 0.71
Right ABI: 0.65

## 2023-11-02 NOTE — Progress Notes (Unsigned)
 [facility] [place] PROGRESS NOTE  ASSESSMENT / PLAN: Bradley [name] is a 78 y.o. male status post right right femoral above-knee popliteal bypass and right common femoral endarterectomy on 11/30/2020.  We will see him again in 6 months for surveillance of bypass. Will order carotid duplex and call him with the results.   SUBJECTIVE: Reports right leg is persistently swollen.  This is only minimally bothersome to him. I told him this will likely continue for the rest of his life. He is understanding and appreciative.   OBJECTIVE: There were no vitals taken for this visit.  No acute distress Regular rate and rhythm Unlabored breathing Soft, obese abdomen Right leg 1-2+ edema from midfoot to knee Right foot warm and well-perfused Right lateral calf ulcerated lesion     Latest Ref Rng & Units 12/01/2020    2:30 AM 11/30/2020    1:42 PM 11/30/2020    6:47 AM  CBC  WBC 4.0 - 10.5 K/uL 7.2  11.0  7.4   Hemoglobin 13.0 - 17.0 g/dL 47.8  29.5  62.1   Hematocrit 39.0 - 52.0 % 38.4  37.4  42.7   Platelets 150 - 400 K/uL 156  170  222         Latest Ref Rng & Units 12/02/2020    1:01 AM 12/01/2020    2:30 AM 11/30/2020    1:42 PM  CMP  Glucose 70 - 99 mg/dL 308  657    BUN 8 - 23 mg/dL 12  29    Creatinine 8.46 - 1.24 mg/dL 9.62  9.52  8.41   Sodium 135 - 145 mmol/L 132  138    Potassium 3.5 - 5.1 mmol/L 3.8  3.7    Chloride 98 - 111 mmol/L 101  102    CO2 22 - 32 mmol/L 24  28    Calcium 8.9 - 10.3 mg/dL 8.9  8.3      +-------+-----------+-----------+------------+------------+  ABI/TBIToday's ABIToday's TBIPrevious ABIPrevious TBI  +-------+-----------+-----------+------------+------------+  Right 1.08       0.86       1.33        0.79          +-------+-----------+-----------+------------+------------+  Left  0.67       0.54       0.71        0.54          +-------+-----------+-----------+------------+------------+      Rande Brunt.  Lenell Antu, MD [facility] Phone Number: [phone] 11/02/2023 7:39 AM

## 2023-11-03 ENCOUNTER — Ambulatory Visit: Payer: PPO | Admitting: Vascular Surgery

## 2023-11-03 ENCOUNTER — Encounter: Payer: Self-pay | Admitting: Vascular Surgery

## 2023-11-03 VITALS — BP 121/74 | HR 82 | Temp 98.1°F | Ht 70.5 in | Wt 208.0 lb

## 2023-11-03 DIAGNOSIS — I739 Peripheral vascular disease, unspecified: Secondary | ICD-10-CM | POA: Diagnosis not present

## 2023-11-03 DIAGNOSIS — I6523 Occlusion and stenosis of bilateral carotid arteries: Secondary | ICD-10-CM

## 2023-11-06 DIAGNOSIS — L570 Actinic keratosis: Secondary | ICD-10-CM | POA: Diagnosis not present

## 2023-11-06 DIAGNOSIS — Z85820 Personal history of malignant melanoma of skin: Secondary | ICD-10-CM | POA: Diagnosis not present

## 2023-11-06 DIAGNOSIS — Z08 Encounter for follow-up examination after completed treatment for malignant neoplasm: Secondary | ICD-10-CM | POA: Diagnosis not present

## 2023-11-06 DIAGNOSIS — D0439 Carcinoma in situ of skin of other parts of face: Secondary | ICD-10-CM | POA: Diagnosis not present

## 2023-11-06 DIAGNOSIS — C44622 Squamous cell carcinoma of skin of right upper limb, including shoulder: Secondary | ICD-10-CM | POA: Diagnosis not present

## 2023-11-06 DIAGNOSIS — D485 Neoplasm of uncertain behavior of skin: Secondary | ICD-10-CM | POA: Diagnosis not present

## 2023-11-06 DIAGNOSIS — Z85828 Personal history of other malignant neoplasm of skin: Secondary | ICD-10-CM | POA: Diagnosis not present

## 2023-11-06 DIAGNOSIS — L821 Other seborrheic keratosis: Secondary | ICD-10-CM | POA: Diagnosis not present

## 2023-11-10 DIAGNOSIS — I2510 Atherosclerotic heart disease of native coronary artery without angina pectoris: Secondary | ICD-10-CM | POA: Diagnosis not present

## 2023-11-10 DIAGNOSIS — I10 Essential (primary) hypertension: Secondary | ICD-10-CM | POA: Diagnosis not present

## 2023-11-10 DIAGNOSIS — Z794 Long term (current) use of insulin: Secondary | ICD-10-CM | POA: Diagnosis not present

## 2023-11-10 DIAGNOSIS — E041 Nontoxic single thyroid nodule: Secondary | ICD-10-CM | POA: Diagnosis not present

## 2023-11-10 DIAGNOSIS — E1142 Type 2 diabetes mellitus with diabetic polyneuropathy: Secondary | ICD-10-CM | POA: Diagnosis not present

## 2023-11-12 DIAGNOSIS — E119 Type 2 diabetes mellitus without complications: Secondary | ICD-10-CM | POA: Diagnosis not present

## 2023-11-12 DIAGNOSIS — H35363 Drusen (degenerative) of macula, bilateral: Secondary | ICD-10-CM | POA: Diagnosis not present

## 2023-11-12 DIAGNOSIS — H524 Presbyopia: Secondary | ICD-10-CM | POA: Diagnosis not present

## 2023-11-12 DIAGNOSIS — H40013 Open angle with borderline findings, low risk, bilateral: Secondary | ICD-10-CM | POA: Diagnosis not present

## 2023-11-13 DIAGNOSIS — E1142 Type 2 diabetes mellitus with diabetic polyneuropathy: Secondary | ICD-10-CM | POA: Diagnosis not present

## 2023-11-18 DIAGNOSIS — N189 Chronic kidney disease, unspecified: Secondary | ICD-10-CM | POA: Diagnosis not present

## 2023-11-18 DIAGNOSIS — N179 Acute kidney failure, unspecified: Secondary | ICD-10-CM | POA: Diagnosis not present

## 2023-11-18 DIAGNOSIS — Z87898 Personal history of other specified conditions: Secondary | ICD-10-CM | POA: Diagnosis not present

## 2023-11-18 DIAGNOSIS — E1122 Type 2 diabetes mellitus with diabetic chronic kidney disease: Secondary | ICD-10-CM | POA: Diagnosis not present

## 2023-11-18 DIAGNOSIS — I129 Hypertensive chronic kidney disease with stage 1 through stage 4 chronic kidney disease, or unspecified chronic kidney disease: Secondary | ICD-10-CM | POA: Diagnosis not present

## 2023-11-18 DIAGNOSIS — N184 Chronic kidney disease, stage 4 (severe): Secondary | ICD-10-CM | POA: Diagnosis not present

## 2023-11-18 DIAGNOSIS — E785 Hyperlipidemia, unspecified: Secondary | ICD-10-CM | POA: Diagnosis not present

## 2023-11-18 DIAGNOSIS — E871 Hypo-osmolality and hyponatremia: Secondary | ICD-10-CM | POA: Diagnosis not present

## 2023-11-18 DIAGNOSIS — I639 Cerebral infarction, unspecified: Secondary | ICD-10-CM | POA: Diagnosis not present

## 2023-11-18 DIAGNOSIS — R809 Proteinuria, unspecified: Secondary | ICD-10-CM | POA: Diagnosis not present

## 2023-11-18 DIAGNOSIS — I2510 Atherosclerotic heart disease of native coronary artery without angina pectoris: Secondary | ICD-10-CM | POA: Diagnosis not present

## 2023-11-19 ENCOUNTER — Encounter (HOSPITAL_BASED_OUTPATIENT_CLINIC_OR_DEPARTMENT_OTHER): Payer: Self-pay | Admitting: Physical Therapy

## 2023-11-19 ENCOUNTER — Encounter: Payer: Self-pay | Admitting: Podiatry

## 2023-11-19 ENCOUNTER — Ambulatory Visit: Admitting: Podiatry

## 2023-11-19 DIAGNOSIS — E1159 Type 2 diabetes mellitus with other circulatory complications: Secondary | ICD-10-CM

## 2023-11-19 DIAGNOSIS — L97512 Non-pressure chronic ulcer of other part of right foot with fat layer exposed: Secondary | ICD-10-CM | POA: Diagnosis not present

## 2023-11-19 MED ORDER — GENTAMICIN SULFATE 0.1 % EX OINT: 1.0000 | TOPICAL_OINTMENT | Freq: Every day | CUTANEOUS | 0 refills | Status: DC

## 2023-11-19 NOTE — Progress Notes (Signed)
 Subjective:  Patient ID: Bradley [name], male    DOB: 1946-07-19,  MRN: [id number]  Chief Complaint  Patient presents with   Wound Check    Patient has an scar on his 3rd toe right foot and it has been there since January 17th of 2025. Patient states that he is not in any pain no discomfort     Discussed the use of AI scribe software for clinical note transcription with the patient, who gave verbal consent to proceed.  History of Present Illness Bradley [name] is a 78 year old male with diabetes and peripheral arterial disease who presents with a non-healing wound on the right third toe. He was referred by Dr. Juanetta Gosling for evaluation of his non-healing toe wound.  He has a non-healing wound on the right third toe, which began as an abrasion and progressed to an eschar. The wound is tender and slow to heal, with no signs of infection such as drainage or bleeding. He is on blood thinners, including 81 mg aspirin daily.  He has a history of peripheral arterial disease, with previous bypass surgery and a stent that was closed off during a prior operation. Recent ABI and TBI testing on October 29, 2023, showed an ABI of 0.56 and a TBI of 0.33 on the right side, with a toe pressure of 44. He is awaiting further evaluation for potential vascular interventions.  His diabetes is poorly controlled, with a recent A1c of 8.1, which may be contributing to the slow healing of his wound.  He recalls breaking the top part of his toe 20 years ago, which never healed properly and now protrudes, making it prone to catching on things.      Objective:    Physical Exam VASCULAR: Pulse is non-palpable. Foot is warm and well-perfused. Capillary fill time is brisk. DERMATOLOGIC: Right third toe with dorsolateral eschar over DIPJ. No active signs of infection. No drainage or bleeding. Mild periwound erythema without cellulitis. Wound debrided to subcutaneous layer. No deep extension to bone, tendon, or joint.  Normal skin turgor, texture, and temperature. No open lesions, rashes, or ulcerations. NEUROLOGIC: Normal sensation to light touch and pressure. No paresthesias on examination. ORTHOPEDIC: Smooth pain-free range of motion of all examined joints. No ecchymosis or bruising. No gross deformity. No pain to palpation.       Results Procedure: Wound debridement Description: The wound was debrided in a full-thickness excisional manner to the subcutaneous layer sharply with a number 313 blade to remove the overlying eschar. Appropriate bleeding was noted. No signs of infection. No deep extension to bone, tendon, or joint. Hemostasis was achieved manually and the wound was wrapped with Silvadene and a gauze bandage.  Postdebridement measurements 0.8 x 0.4 x 0.2 cm Informed Consent: The procedure was performed following informed consent. The patient did not require topical anesthetic.  LABS HbA1c: 8.1  DIAGNOSTIC ABI: Right side ABI 0.56 (10/29/2023) TBI: Right side TBI 0.33 with 44 toe pressure (10/29/2023)   Assessment:   1. Ulcer of right foot with fat layer exposed ([name])   2. Type 2 diabetes mellitus with vascular disease ([name])      Plan:  Patient was evaluated and treated and all questions answered.  Assessment and Plan Assessment & Plan Right third toe eschar Dorsolateral eschar over the DIPJ of the right third toe with mild periwound hematocellulitis. No infection, drainage, or bleeding. ABI of 0.56 and TBI of 0.33 indicate compromised blood flow. Likely due to peripheral arterial disease and  slow healing possibly due to diabetes and suboptimal glycemic control. Debrided as noted above of with no deep extension to bone, tendon, or joint. Expected to heal slowly with proper wound care and glycemic control. Further intervention if no improvement or worsening. - Prescribe gentamicin ointment daily with a bandage. - Follow-up in a few weeks to assess healing progress. - Monitor for  infection signs, initiate oral antibiotics if necessary. - Communicate with Dr. Juanetta Gosling regarding condition and progress.  Peripheral arterial disease (PAD) Contributing to compromised blood flow in the right lower extremity. ABI and TBI confirm significant arterial insufficiency. Managed by Dr. Juanetta Gosling with focus on wound care and potential interventions if healing does not progress. - Continue collaboration with Dr. Juanetta Gosling for vascular management.  Diabetes mellitus A1c of 8.1% indicates suboptimal glycemic control, contributing to slow wound healing and increased infection risk. Improved glycemic control necessary for better healing and reduced infection risk. - Encourage improved glycemic control to lower A1c to around 7% for better wound healing and reduced infection risk. - Consider referral for diabetic shoes post-wound healing to prevent complications.      Return in about 3 weeks (around 12/10/2023) for wound care.

## 2023-11-30 ENCOUNTER — Telehealth (HOSPITAL_BASED_OUTPATIENT_CLINIC_OR_DEPARTMENT_OTHER): Payer: Self-pay | Admitting: Physical Therapy

## 2023-11-30 NOTE — Telephone Encounter (Signed)
 Called and LVM to remind patient of upcoming physical therapy evaluation appointment. Requested call back to confirm appointment attendance.

## 2023-12-01 ENCOUNTER — Ambulatory Visit (HOSPITAL_BASED_OUTPATIENT_CLINIC_OR_DEPARTMENT_OTHER): Attending: Neurosurgery | Admitting: Physical Therapy

## 2023-12-01 ENCOUNTER — Encounter (HOSPITAL_BASED_OUTPATIENT_CLINIC_OR_DEPARTMENT_OTHER): Payer: Self-pay | Admitting: Physical Therapy

## 2023-12-01 DIAGNOSIS — M542 Cervicalgia: Secondary | ICD-10-CM | POA: Insufficient documentation

## 2023-12-01 DIAGNOSIS — R2689 Other abnormalities of gait and mobility: Secondary | ICD-10-CM | POA: Insufficient documentation

## 2023-12-01 NOTE — Therapy (Signed)
 OUTPATIENT PHYSICAL THERAPY CERVICAL EVALUATION   Patient Name: Bradley [name] MRN: [id number] DOB:[birth year], 78 y.o., male Today's Date: 12/01/2023  END OF SESSION:  PT End of Session - 12/01/23 1528     Visit Number 1    Number of Visits 16    Date for PT Re-Evaluation 11/25/22    Authorization Type progress note done on visit 12 next to be done on 22    PT Start Time 1516    PT Stop Time 1559    PT Time Calculation (min) 43 min    Activity Tolerance Patient tolerated treatment well;No increased pain    Behavior During Therapy [facility] for tasks assessed/performed             Past Medical History:  Diagnosis Date   Acquired equinus deformity of foot 12/15/2011   Anemia    Arthritis    Carotid artery occlusion    a. Carotid US  10/16: RICA 60-79%, L CEA patent with 1-39% stenosis   CKD (chronic kidney disease)    stage 3-4   Coagulation disorder ([name]) 05/25/2019   Coronary artery disease    a. Myoview  9/16:  EF 52%, inferior fixed defect consistent with diaphragmatic attenuation, intermediate risk secondary to poor exercise tolerance and symptoms during stress;  b. LHC 05/17/2015 90% mid LAD, 80% ost D2, 75% mid RCA, 35% prox RCA. >> S/p CABG   Coronary artery disease involving coronary bypass graft of native heart with angina pectoris ([name])    CORONARY ARTERY BYPASS GRAFTING x 3 - 05/23/2015 Left internal mammary artery to left anterior descending  Saphenous vein graft to diagonal  Saphenous vein graft to posterior descending Endoscopic greater saphenous vein harvest right thigh     Diabetes mellitus age 45   Diverticulitis    Diverticulosis of colon without hemorrhage 05/17/2015   By CT scan    DM type 2, controlled, with complication ([name]) 04/29/2015   Essential hypertension 04/29/2015   GERD (gastroesophageal reflux disease)    H/O hiatal hernia    History of echocardiogram    a. Echo 9/16: GLS -15.2%, EF 55-60%, grade 1 diastolic dysfunction, normal wall motion,  aortic sclerosis, dilated aortic root 39 mm, atrial septal lipomatous hypertrophy   Hyperlipidemia    Hypertension    Irritable bowel syndrome (IBS)    Myocardial infarction ([name])    silent inferior MI; patient denies MI history (03/17/13)    Neuralgia    Neuropathy 2013   OSA on CPAP 05/30/2015   Osteoporosis 2013   PAF (paroxysmal atrial fibrillation) ([name])    post CABG 2016; Amiodarone  stopped 2/2 wheezing; PAF 01/12/20   Peripheral vascular disease ([name])    Plantar fasciitis 12/15/2011   Pneumonia    Reflux esophagitis    Right bundle branch block 04/30/2015   Sleep apnea    uses CPAP   Stenosis of right internal carotid artery with cerebral infarction ([name]) 12/09/2011   Stress fracture of metatarsal bone 02/18/2012   Stroke ([name]) 1987   Right brain stroke- slight drop on left side   Syncope 01/12/2020   Type 2 diabetes mellitus with vascular disease ([name]) 05/30/2015   Wears glasses    Past Surgical History:  Procedure Laterality Date   ABDOMINAL AORTOGRAM W/LOWER EXTREMITY Bilateral 11/27/2020   Procedure: ABDOMINAL AORTOGRAM W/LOWER EXTREMITY;  Surgeon: Margherita Shell, MD;  Location: [facility] INVASIVE CV LAB;  Service: Cardiovascular;  Laterality: Bilateral;   ANTERIOR CERVICAL DECOMP/DISCECTOMY FUSION N/A 08/25/2023   Procedure: CERVICAL THREE-FOUR, CERVICAL FOUR-FIVE,  CERVICAL FIVE-SIX ANTERIOR CERVICAL DECOMPRESSION/DISCECTOMY FUSION;  Surgeon: Van Gelinas, MD;  Location: [facility] OR;  Service: Neurosurgery;  Laterality: N/A;   CARDIAC CATHETERIZATION N/A 05/17/2015   Procedure: Left Heart Cath and Coronary Angiography;  Surgeon: Arty Binning, MD;  Location: [facility] INVASIVE CV LAB;  Service: Cardiovascular;  Laterality: N/A;   CAROTID ENDARTERECTOMY  1994 & redo 2001   Left   CATARACT EXTRACTION W/ INTRAOCULAR LENS IMPLANT Bilateral    COLONOSCOPY W/ BIOPSIES AND POLYPECTOMY     CORONARY ARTERY BYPASS GRAFT N/A 05/23/2015   Procedure: CORONARY ARTERY BYPASS GRAFTING (CABG) x 3  (LIMA to LAD, SVG to DIAGONAL 2, SVG to PDA) with Endoscopic Vein Harvesting from right greater saphenous vein;  Surgeon: Norita Beauvais, MD;  Location: [facility] OR;  Service: Open Heart Surgery;  Laterality: N/A;   ENDARTERECTOMY FEMORAL Right 11/30/2020   Procedure: ENDARTERECTOMY FEMORAL RIGHT;  Surgeon: Mayo Speck, MD;  Location: [facility] OR;  Service: Vascular;  Laterality: Right;   FEMORAL-POPLITEAL BYPASS GRAFT Right 11/30/2020   Procedure: BYPASS GRAFT FEMORAL-POPLITEAL ARTERY RIGHT;  Surgeon: Mayo Speck, MD;  Location: [facility] OR;  Service: Vascular;  Laterality: Right;   FRACTURE SURGERY  2013   Right   foo  t X's 2   ILIAC ARTERY STENT     LAPAROSCOPIC CHOLECYSTECTOMY  09/11/2016   POSTERIOR LUMBAR FUSION  03/24/2013   Level 1   PR VEIN BYPASS GRAFT,AORTO-FEM-POP  1998   SPINE SURGERY  2014   TEE WITHOUT CARDIOVERSION N/A 05/23/2015   Procedure: TRANSESOPHAGEAL ECHOCARDIOGRAM (TEE);  Surgeon: Norita Beauvais, MD;  Location: [facility] OR;  Service: Open Heart Surgery;  Laterality: N/A;   TONSILLECTOMY     TRANSCAROTID ARTERY REVASCULARIZATION  Right 06/01/2020   Procedure: RIGHT TRANSCAROTID ARTERY REVASCULARIZATION;  Surgeon: Margherita Shell, MD;  Location: [facility] OR;  Service: Vascular;  Laterality: Right;   TRIGGER FINGER RELEASE Right 07/14/2017   Procedure: RIGHT LONG FINGER TRIGGER RELEASE;  Surgeon: Rober Chimera, MD;  Location: [facility];  Service: Orthopedics;  Laterality: Right;   Patient Active Problem List   Diagnosis Date Noted   Cervical myelopathy ([name]) 08/25/2023   Diabetic neuropathy ([name]) 06/03/2022   PAD (peripheral artery disease) ([name]) 11/30/2020   Carotid stenosis 06/01/2020   Carotid stenosis, asymptomatic 06/01/2020   Syncope 01/12/2020   Coagulation disorder ([name]) 05/25/2019   Coronary artery disease involving coronary bypass graft of native heart with angina pectoris ([name])    PAF (paroxysmal atrial fibrillation) ([name])    Type 2 diabetes mellitus  with vascular disease ([name]) 05/30/2015   OSA on CPAP 05/30/2015   Abnormal stress test 05/18/2015   Diverticulosis of colon without hemorrhage 05/17/2015   Right bundle branch block 04/30/2015   Essential hypertension 04/29/2015   Hyperlipidemia 04/29/2015   DM type 2, controlled, with complication ([name]) 04/29/2015   Peripheral vascular disease ([name]) 06/08/2012   Stress fracture of metatarsal bone 02/18/2012   Acquired equinus deformity of foot 12/15/2011   Plantar fasciitis 12/15/2011   Stenosis of right internal carotid artery with cerebral infarction ([name]) 12/09/2011    PCP: Roxanna Coppersmith MD  REFERRING PROVIDER: Van Gelinas, MD  REFERRING DIAG: [phone] (ICD-10-CM) - Other spondylosis with myelopathy, cervical region   THERAPY DIAG:  Cervicalgia  Other abnormalities of gait and mobility  Rationale for Evaluation and Treatment: Rehabilitation  ONSET DATE: DOS: 1/14 fusion of c3-c6  SUBJECTIVE:  SUBJECTIVE STATEMENT: Eval: Pt says the neck hurts in the back and around the shoulder blades on both sides.  Hand dominance: Right  PERTINENT HISTORY:  Anemia, acquired equinus deformity; CAD< CKD, DMII, IBS, MI, MI, neuralgia, PF, carotid stenosis Stroke, Torn Rotator cuff; CABG; vein bypass graft 1998; Spine surgery in 2014     PAIN:  PAIN:  Are you having pain? Yes: NPRS scale: 5/10 Pain location: cervical spine into bilateral shoulders  Pain description: aching Aggravating factors: UE activity  Relieving factors:  rest  PRECAUTIONS: None  RED FLAGS: None     WEIGHT BEARING RESTRICTIONS: No  FALLS:  Has patient fallen in last 6 months? No  LIVING ENVIRONMENT:   OCCUPATION:    PLOF: Independent  PATIENT GOALS: get pain down and get stronger  NEXT MD  VISIT: 12/10/2023  OBJECTIVE:  Note: Objective measures were completed at Evaluation unless otherwise noted.  DIAGNOSTIC FINDINGS:  Surgery: CERVICAL THREE-FOUR, CERVICAL FOUR-FIVE, CERVICAL FIVE-SIX ANTERIOR CERVICAL DECOMPRESSION/DISCECTOMY FUSION   PATIENT SURVEYS:  NDI    COGNITION: Overall cognitive status: Within functional limits for tasks assessed  SENSATION: WFL  POSTURE: rounded shoulders, forward head, and flexed trunk   PALPATION: Spasming in cervical spine and into peri-scapular area  CERVICAL ROM:   Active ROM A/PROM (deg) eval  Flexion 25  Extension 20  Right lateral flexion 2  Left lateral flexion 2  Right rotation 42  Left rotation 40   (Blank rows = not tested)  UPPER EXTREMITY ROM:  Active ROM Right eval Left eval  Shoulder flexion 135 120  Shoulder extension    Shoulder abduction 155 145  Shoulder adduction    Shoulder extension    Shoulder internal rotation T12 T12  Shoulder external rotation T1 Base of occiput  Elbow flexion    Elbow extension    Wrist flexion    Wrist extension    Wrist ulnar deviation    Wrist radial deviation    Wrist pronation    Wrist supination     (Blank rows = not tested)  UPPER EXTREMITY MMT:  MMT Right eval Left eval  Shoulder flexion 4 4-  Shoulder extension    Shoulder abduction    Shoulder adduction    Shoulder extension    Shoulder internal rotation    Shoulder external rotation    Middle trapezius    Lower trapezius    Elbow flexion    Elbow extension    Wrist flexion    Wrist extension    Wrist ulnar deviation    Wrist radial deviation    Wrist pronation    Wrist supination    Grip strength     (Blank rows = not tested)  Grip test:  R: 50lbs  L: 20lbs  CERVICAL SPECIAL TESTS:    FUNCTIONAL TESTS:  Feet together eyes closed: able to complete, moderate swaying Tandem eyes closed: able to complete moderate swaying  Treatment Date: 4/22 Manual: All PROM performed with  distraction to reduce pain and improve movement  There-ex:  There-Act  Self Care  Neuro-Re-ed    PATIENT EDUCATION:  Education details: Symptom management, HEP, self care Person educated: Patient Education method: Explanation, Demonstration, Tactile cues, Verbal cues, and Handouts Education comprehension: verbalized understanding and returned demonstration  HOME EXERCISE PROGRAM:   ASSESSMENT:  CLINICAL IMPRESSION: Patient is a 78 y.o. male who was seen today for physical therapy evaluation and treatment for ***.   OBJECTIVE IMPAIRMENTS: Abnormal gait, decreased activity tolerance, decreased balance, decreased endurance, difficulty  walking, decreased ROM, decreased strength, increased fascial restrictions, increased muscle spasms, postural dysfunction, and pain.   ACTIVITY LIMITATIONS: carrying, lifting, sitting, standing, squatting, transfers, dressing, self feeding, reach over head, and hygiene/grooming  PARTICIPATION LIMITATIONS: meal prep, cleaning, laundry, driving, shopping, community activity, and yard work  PERSONAL FACTORS: 3+ comorbidities: multi joint OA, low back pain, right leg stent failure  are also affecting patient's functional outcome.   REHAB POTENTIAL: Good  CLINICAL DECISION MAKING: Evolving/moderate complexity  EVALUATION COMPLEXITY: Moderate   GOALS: Goals reviewed with patient? Yes  SHORT TERM GOALS: Target date: 12/29/2023     Baseline:  Goal status: INITIAL  2.  *** Baseline:  Goal status: INITIAL  3.  *** Baseline:  Goal status: INITIAL  4.  *** Baseline:  Goal status: INITIAL  5.  *** Baseline:  Goal status: INITIAL  6.  *** Baseline:  Goal status: INITIAL  LONG TERM GOALS: Target date: 01/26/2024    *** Baseline:  Goal status: INITIAL  2.  *** Baseline:  Goal status: INITIAL  3.  *** Baseline:  Goal status: INITIAL  4.  *** Baseline:  Goal status: INITIAL  5.  *** Baseline:  Goal status:  INITIAL  6.  *** Baseline:  Goal status: INITIAL   PLAN:  PT FREQUENCY: 1-2x/week  PT DURATION: 8 weeks  PLANNED INTERVENTIONS:   PLAN FOR NEXT SESSION: ***   Kitty Perkins, PT 12/01/2023, 5:22 PM

## 2023-12-02 ENCOUNTER — Encounter (HOSPITAL_BASED_OUTPATIENT_CLINIC_OR_DEPARTMENT_OTHER): Payer: Self-pay | Admitting: Physical Therapy

## 2023-12-03 DIAGNOSIS — M65332 Trigger finger, left middle finger: Secondary | ICD-10-CM | POA: Diagnosis not present

## 2023-12-08 ENCOUNTER — Ambulatory Visit: Attending: Vascular Surgery

## 2023-12-08 NOTE — Progress Notes (Deleted)
 HISTORY AND PHYSICAL     CC:  follow up. Requesting Provider:  Jimmey Mould, MD  HPI: This is a 78 y.o. male who is here today for follow up for PAD.  Pt has hx of  right lower extremity claudication type symptoms.  He is status post right femoral above-knee popliteal artery bypass on 11/30/2020 with Dr. Shirley Douglas.  He also underwent an aortobifemoral bypass in 1998.  He underwent left carotid endarterectomy in 1994 and redo in 2001.  Right TCAR was performed 06/01/20 by Dr. Charlotte Cookey.   He underwent urgent cervical spinal surgery in January 2025.   Pt was last seen 11/03/2023 by Dr. Edgardo Goodwill and at that time, he was having short distance claudication around 50 yards shortly after his January hospitalization.  He had a well appearing ulceration on the right 3rd toe that he said was healing.  His bypass was clinically thrombosed and Dr. Edgardo Goodwill and pt wanted watchful waiting.    The pt returns today for follow up.  ***  The pt is on a statin for cholesterol management.    The pt is on an aspirin .    Other AC:  Eliquis  The pt is on CCB, BB, hydralazine  for hypertension.  The pt is  on medication for diabetes. Tobacco hx:  former  Pt does *** have family hx of AAA.  Past Medical History:  Diagnosis Date   Acquired equinus deformity of foot 12/15/2011   Anemia    Arthritis    Carotid artery occlusion    a. Carotid US  10/16: RICA 60-79%, L CEA patent with 1-39% stenosis   CKD (chronic kidney disease)    stage 3-4   Coagulation disorder ([name]) 05/25/2019   Coronary artery disease    a. Myoview  9/16:  EF 52%, inferior fixed defect consistent with diaphragmatic attenuation, intermediate risk secondary to poor exercise tolerance and symptoms during stress;  b. LHC 05/17/2015 90% mid LAD, 80% ost D2, 75% mid RCA, 35% prox RCA. >> S/p CABG   Coronary artery disease involving coronary bypass graft of native heart with angina pectoris ([name])    CORONARY ARTERY BYPASS GRAFTING x 3 - 05/23/2015 Left  internal mammary artery to left anterior descending  Saphenous vein graft to diagonal  Saphenous vein graft to posterior descending Endoscopic greater saphenous vein harvest right thigh     Diabetes mellitus age 87   Diverticulitis    Diverticulosis of colon without hemorrhage 05/17/2015   By CT scan    DM type 2, controlled, with complication ([name]) 04/29/2015   Essential hypertension 04/29/2015   GERD (gastroesophageal reflux disease)    H/O hiatal hernia    History of echocardiogram    a. Echo 9/16: GLS -15.2%, EF 55-60%, grade 1 diastolic dysfunction, normal wall motion, aortic sclerosis, dilated aortic root 39 mm, atrial septal lipomatous hypertrophy   Hyperlipidemia    Hypertension    Irritable bowel syndrome (IBS)    Myocardial infarction ([name])    silent inferior MI; patient denies MI history (03/17/13)    Neuralgia    Neuropathy 2013   OSA on CPAP 05/30/2015   Osteoporosis 2013   PAF (paroxysmal atrial fibrillation) ([name])    post CABG 2016; Amiodarone  stopped 2/2 wheezing; PAF 01/12/20   Peripheral vascular disease ([name])    Plantar fasciitis 12/15/2011   Pneumonia    Reflux esophagitis    Right bundle branch block 04/30/2015   Sleep apnea    uses CPAP   Stenosis of right internal carotid artery  with cerebral infarction ([name]) 12/09/2011   Stress fracture of metatarsal bone 02/18/2012   Stroke ([name]) 1987   Right brain stroke- slight drop on left side   Syncope 01/12/2020   Type 2 diabetes mellitus with vascular disease ([name]) 05/30/2015   Wears glasses     Past Surgical History:  Procedure Laterality Date   ABDOMINAL AORTOGRAM W/LOWER EXTREMITY Bilateral 11/27/2020   Procedure: ABDOMINAL AORTOGRAM W/LOWER EXTREMITY;  Surgeon: Margherita Shell, MD;  Location: [facility] INVASIVE CV LAB;  Service: Cardiovascular;  Laterality: Bilateral;   ANTERIOR CERVICAL DECOMP/DISCECTOMY FUSION N/A 08/25/2023   Procedure: CERVICAL THREE-FOUR, CERVICAL FOUR-FIVE, CERVICAL FIVE-SIX ANTERIOR CERVICAL  DECOMPRESSION/DISCECTOMY FUSION;  Surgeon: Van Gelinas, MD;  Location: [facility] OR;  Service: Neurosurgery;  Laterality: N/A;   CARDIAC CATHETERIZATION N/A 05/17/2015   Procedure: Left Heart Cath and Coronary Angiography;  Surgeon: Arty Binning, MD;  Location: [facility] INVASIVE CV LAB;  Service: Cardiovascular;  Laterality: N/A;   CAROTID ENDARTERECTOMY  1994 & redo 2001   Left   CATARACT EXTRACTION W/ INTRAOCULAR LENS IMPLANT Bilateral    COLONOSCOPY W/ BIOPSIES AND POLYPECTOMY     CORONARY ARTERY BYPASS GRAFT N/A 05/23/2015   Procedure: CORONARY ARTERY BYPASS GRAFTING (CABG) x 3 (LIMA to LAD, SVG to DIAGONAL 2, SVG to PDA) with Endoscopic Vein Harvesting from right greater saphenous vein;  Surgeon: Norita Beauvais, MD;  Location: [facility] OR;  Service: Open Heart Surgery;  Laterality: N/A;   ENDARTERECTOMY FEMORAL Right 11/30/2020   Procedure: ENDARTERECTOMY FEMORAL RIGHT;  Surgeon: Mayo Speck, MD;  [facility];  Service: Vascular;  Laterality: Right;   FEMORAL-POPLITEAL BYPASS GRAFT Right 11/30/2020   Procedure: BYPASS GRAFT FEMORAL-POPLITEAL ARTERY RIGHT;  Surgeon: Mayo Speck, MD;  Location: [facility] OR;  Service: Vascular;  Laterality: Right;   FRACTURE SURGERY  2013   Right   foo  t X's 2   ILIAC ARTERY STENT     LAPAROSCOPIC CHOLECYSTECTOMY  09/11/2016   POSTERIOR LUMBAR FUSION  03/24/2013   Level 1   PR VEIN BYPASS GRAFT,AORTO-FEM-POP  1998   SPINE SURGERY  2014   TEE WITHOUT CARDIOVERSION N/A 05/23/2015   Procedure: TRANSESOPHAGEAL ECHOCARDIOGRAM (TEE);  Surgeon: Norita Beauvais, MD;  Location: [facility] OR;  Service: Open Heart Surgery;  Laterality: N/A;   TONSILLECTOMY     TRANSCAROTID ARTERY REVASCULARIZATION  Right 06/01/2020   Procedure: RIGHT TRANSCAROTID ARTERY REVASCULARIZATION;  Surgeon: Margherita Shell, MD;  Location: [facility] OR;  Service: Vascular;  Laterality: Right;   TRIGGER FINGER RELEASE Right 07/14/2017   Procedure: RIGHT LONG FINGER TRIGGER RELEASE;  Surgeon: Rober Chimera, MD;  Location: [facility];  Service: Orthopedics;  Laterality: Right;    Allergies  Allergen Reactions   Doxazosin Mesylate     Other reaction(s): Diarrhea, dizziness   Hydrocodone Nausea Only   Niacin     Other reaction(s): Skin irritation   Oxycodone  Nausea Only   Penicillins Rash    Has patient had a PCN reaction causing immediate rash, facial/tongue/throat swelling, SOB or lightheadedness with hypotension: NO Has patient had a PCN reaction causing severe rash involving mucus membranes or skin necrosis:NO Has patient had a PCN reaction that required hospitalization NO Has patient had a PCN reaction occurring within the last 10 years: NO If all of the above answers are "NO", then may proceed with Cephalosporin use.    Sulfa Drugs Cross Reactors Rash    Current Outpatient Medications  Medication Sig Dispense Refill   amLODipine  (NORVASC )  10 MG tablet Take 1 tablet (10 mg total) by mouth daily. 90 tablet 3   apixaban  (ELIQUIS ) 5 MG TABS tablet Take 1 tablet (5 mg total) by mouth 2 (two) times daily. 180 tablet 1   aspirin  EC 81 MG tablet Take 81 mg by mouth daily. Swallow whole.     atorvastatin  (LIPITOR) 40 MG tablet Take 40 mg by mouth daily.     Cholecalciferol  (VITAMIN D3) 125 MCG (5000 UT) TABS Take 5,000 Units by mouth daily.      dexlansoprazole (DEXILANT) 60 MG capsule Take 60 mg by mouth daily.      docusate sodium  (COLACE) 100 MG capsule Take 1 capsule (100 mg total) by mouth 2 (two) times daily. (Patient not taking: Reported on 12/01/2023) 10 capsule 0   Dulaglutide  (TRULICITY ) 3 MG/0.5ML SOPN Inject 3 mg into the skin every Sunday.     famotidine  (PEPCID ) 20 MG tablet Take 20 mg by mouth at bedtime.     gabapentin  (NEURONTIN ) 300 MG capsule Take 600 mg by mouth at bedtime.      gentamicin  ointment (GARAMYCIN ) 0.1 % Apply 1 Application topically daily. Apply to wound daily 30 g 0   HUMALOG  KWIKPEN 100 UNIT/ML KwikPen Inject 25-30 Units into the skin 3  (three) times daily.     hydrALAZINE  (APRESOLINE ) 25 MG tablet Take 1 tablet (25 mg total) by mouth in the morning and at bedtime. 180 tablet 2   hydrocortisone  (ANUSOL -HC) 2.5 % rectal cream Place 1 application rectally daily as needed for hemorrhoids.      ketoconazole  (NIZORAL ) 2 % shampoo Apply 1 application topically daily as needed for irritation.      Melatonin 5 MG CHEW Chew 10 mg by mouth at bedtime.     methocarbamol  (ROBAXIN ) 500 MG tablet Take 500 mg by mouth every 6 (six) hours as needed for muscle spasms.      metoprolol  tartrate (LOPRESSOR ) 50 MG tablet TAKE 1 AND 1/2 TABLETS BY MOUTH TWICE DAILY 270 tablet 0   nitroGLYCERIN  (NITROSTAT ) 0.3 MG SL tablet Place 1 tablet (0.3 mg total) under the tongue every 5 (five) minutes as needed for chest pain. Only as needed 90 tablet 0   testosterone  cypionate (DEPOTESTOSTERONE CYPIONATE) 200 MG/ML injection Inject 200 mg into the muscle once a week.     torsemide  (DEMADEX ) 20 MG tablet Take 1 tablet (20 mg total) by mouth daily. 90 tablet 3   traMADol  (ULTRAM ) 50 MG tablet Take 1-2 tablets (50-100 mg total) by mouth every 6 (six) hours as needed for moderate pain (pain score 4-6) or severe pain (pain score 7-10). 30 tablet 0   TRESIBA  FLEXTOUCH 100 UNIT/ML SOPN FlexTouch Pen Inject 25 Units into the skin daily.     No current facility-administered medications for this visit.    Family History  Problem Relation Age of Onset   Cancer Mother 21       pancreatic   Hyperlipidemia Mother    Stroke Father 2   Deep vein thrombosis Father    Hyperlipidemia Father    Hypertension Father     Social History   Socioeconomic History   Marital status: Married    Spouse name: Not on file   Number of children: 2   Years of education: Not on file   Highest education level: Not on file  Occupational History   Not on file  Tobacco Use   Smoking status: Former    Current packs/day: 0.00    Average packs/day:  2.0 packs/day for 50.0 years  (100.0 ttl pk-yrs)    Types: Cigarettes    Start date: 03/27/1959    Quit date: 03/26/2009    Years since quitting: 14.7   Smokeless tobacco: Never  Vaping Use   Vaping status: Never Used  Substance and Sexual Activity   Alcohol use: No   Drug use: No   Sexual activity: Yes  Other Topics Concern   Not on file  Social History Narrative   Lives with wife   Caffeine coffee 3 c daily, diet coke   Social Drivers of Corporate investment banker Strain: Not on file  Food Insecurity: Not on file  Transportation Needs: Not on file  Physical Activity: Not on file  Stress: Not on file  Social Connections: Not on file  Intimate Partner Violence: Not on file     REVIEW OF SYSTEMS:  *** [X]  denotes positive finding, [ ]  denotes negative finding Cardiac  Comments:  Chest pain or chest pressure:    Shortness of breath upon exertion:    Short of breath when lying flat:    Irregular heart rhythm:        Vascular    Pain in calf, thigh, or hip brought on by ambulation:    Pain in feet at night that wakes you up from your sleep:     Blood clot in your veins:    Leg swelling:         Pulmonary    Oxygen at home:    Productive cough:     Wheezing:         Neurologic    Sudden weakness in arms or legs:     Sudden numbness in arms or legs:     Sudden onset of difficulty speaking or slurred speech:    Temporary loss of vision in one eye:     Problems with dizziness:         Gastrointestinal    Blood in stool:     Vomited blood:         Genitourinary    Burning when urinating:     Blood in urine:        Psychiatric    Major depression:         Hematologic    Bleeding problems:    Problems with blood clotting too easily:        Skin    Rashes or ulcers:        Constitutional    Fever or chills:      PHYSICAL EXAMINATION:  ***  General:  WDWN in NAD; vital signs documented above Gait: Not observed HENT: WNL, normocephalic Pulmonary: normal non-labored breathing  , without wheezing Cardiac: {Desc; regular/irreg:14544} HR, {With/Without:20273} carotid bruit*** Abdomen: soft, NT; aortic pulse is *** palpable Skin: {With/Without:20273} rashes Vascular Exam/Pulses:  Right Left  Radial {Exam; arterial pulse strength 0-4:30167} {Exam; arterial pulse strength 0-4:30167}  Femoral {Exam; arterial pulse strength 0-4:30167} {Exam; arterial pulse strength 0-4:30167}  Popliteal {Exam; arterial pulse strength 0-4:30167} {Exam; arterial pulse strength 0-4:30167}  DP {Exam; arterial pulse strength 0-4:30167} {Exam; arterial pulse strength 0-4:30167}  PT {Exam; arterial pulse strength 0-4:30167} {Exam; arterial pulse strength 0-4:30167}  Peroneal *** ***   Extremities: {With/Without:20273} ischemic changes, {With/Without:20273} Gangrene , {With/Without:20273} cellulitis; {With/Without:20273} open wounds Musculoskeletal: no muscle wasting or atrophy  Neurologic: A&O X 3 Psychiatric:  The pt has {Desc; normal/abnormal:11317::"Normal"} affect.   Non-Invasive Vascular Imaging:   ABI's/TBI's on 10/29/2023: Right:  0.65/0.33 -  Great toe pressure: 44 Left:  0.71/0.60 - Great toe pressure: 81  Carotid duplex on 10/29/2023: Right:  patent stent without evidence of re-stenosis Left:  1-39% ICA stenosis     ASSESSMENT/PLAN:: 78 y.o. male here for follow up for PAD with hx of  right lower extremity claudication type symptoms.  He is status post right femoral above-knee popliteal artery bypass on 11/30/2020 with Dr. Shirley Douglas.  He also underwent an aortobifemoral bypass in 1998.  He underwent left carotid endarterectomy in 1994 and redo in 2001.  Right TCAR was performed 06/01/20 by Dr. Charlotte Cookey.   He underwent urgent cervical spinal surgery in January 2025.    -*** -continue *** -pt will f/u in *** with ***.   Maryanna Smart, [facility] [phone]  Clinic MD:   Edgardo Goodwill

## 2023-12-10 ENCOUNTER — Ambulatory Visit: Admitting: Podiatry

## 2023-12-10 ENCOUNTER — Encounter: Payer: Self-pay | Admitting: Podiatry

## 2023-12-10 ENCOUNTER — Telehealth: Payer: Self-pay | Admitting: Cardiovascular Disease

## 2023-12-10 VITALS — Ht 70.5 in | Wt 208.0 lb

## 2023-12-10 DIAGNOSIS — L97512 Non-pressure chronic ulcer of other part of right foot with fat layer exposed: Secondary | ICD-10-CM

## 2023-12-10 NOTE — Progress Notes (Signed)
  Subjective:  Patient ID: Bradley [name], male    DOB: 07/01/1946,  MRN: [id number]  Chief Complaint  Patient presents with   Wound Check    Patient is here for right 4th toe wound check doing good completley healed    Discussed the use of AI scribe software for clinical note transcription with the patient, who gave verbal consent to proceed.  History of Present Illness He returns for follow-up he has noticed no drainage      Objective:    Physical Exam VASCULAR: Pulse is non-palpable. Foot is warm and well-perfused. Capillary fill time is brisk. DERMATOLOGIC: Right third toe ulceration has healed small scab present no infection no drainage NEUROLOGIC: Normal sensation to light touch and pressure. No paresthesias on examination. ORTHOPEDIC: Smooth pain-free range of motion of all examined joints. No ecchymosis or bruising. No gross deformity. No pain to palpation.       Results  LABS HbA1c: 8.1  DIAGNOSTIC ABI: Right side ABI 0.56 (10/29/2023) TBI: Right side TBI 0.33 with 44 toe pressure (10/29/2023)   Assessment:   1. Ulcer of right foot with fat layer exposed ([name])       Plan:  Patient was evaluated and treated and all questions answered.  Assessment and Plan Assessment & Plan Right third toe eschar Overall doing very well the wound is healed there is a small scab left that I advised him to use Neosporin and Vaseline on until it completely resolves discussed possibility of recurrence he should notify me immediately if he develops any signs and/or symptoms of new ulceration      No follow-ups on file.

## 2023-12-10 NOTE — Telephone Encounter (Signed)
   Pre-operative Risk Assessment    Patient Name: Bradley [name]  DOB: 03-20-1946 MRN: [id number]   Date of last office visit: 05/18/2023 Date of next office visit: 18yr f/u tbd   Request for Surgical Clearance    Procedure:   A left lung finger trigger release   Date of Surgery:  Clearance 12/23/23                                Surgeon:  Dr. Rober Chimera Surgeon's Group or Practice Name:  [facility] Phone number:  [phone] Fax number:  [phone]   Type of Clearance Requested:   - Medical  - Pharmacy:  Hold Apixaban  (Eliquis ) TBD by card   Type of Anesthesia:  MAC   Additional requests/questions:    Audley Bleak   12/10/2023, 4:46 PM

## 2023-12-14 DIAGNOSIS — E1142 Type 2 diabetes mellitus with diabetic polyneuropathy: Secondary | ICD-10-CM | POA: Diagnosis not present

## 2023-12-16 NOTE — Telephone Encounter (Signed)
 Patient with diagnosis of afib on Eliquis  for anticoagulation.    Procedure: A left lung finger trigger release  Date of procedure: 12/23/23   CHA2DS2-VASc Score = 7   This indicates a 11.2% annual risk of stroke. The patient's score is based upon: CHF History: 0 HTN History: 1 Diabetes History: 1 Stroke History: 2 Vascular Disease History: 1 Age Score: 2 Gender Score: 0      Patient with remote stroke in 1987  Previously cleared to hold Eliquis  four days by Dr. Abel Hoe for a spinal procedure.  CrCl 27 ml/min Platelet count 197  Patient has not had an Afib/aflutter ablation within the last 3 months or DCCV within the last 30 days  Per office protocol, patient can hold Eliquis  for 3 days prior to procedure.    **This guidance is not considered finalized until pre-operative APP has relayed final recommendations.**

## 2023-12-17 ENCOUNTER — Telehealth (INDEPENDENT_AMBULATORY_CARE_PROVIDER_SITE_OTHER): Admitting: Student

## 2023-12-17 ENCOUNTER — Telehealth: Payer: Self-pay

## 2023-12-17 DIAGNOSIS — Z01810 Encounter for preprocedural cardiovascular examination: Secondary | ICD-10-CM

## 2023-12-17 NOTE — Telephone Encounter (Signed)
 S/W pt TELE Preop appt 12/17/23. Med Rec and consent done

## 2023-12-17 NOTE — Progress Notes (Signed)
 Virtual Visit via Telephone Note   Because of Bradley [name]'s co-morbid illnesses, he is at least at moderate risk for complications without adequate follow up.  This format is felt to be most appropriate for this patient at this time.  The patient did not have access to video technology/had technical difficulties with video requiring transitioning to audio format only (telephone).  All issues noted in this document were discussed and addressed.  No physical exam could be performed with this format.  Please refer to the patient's chart for his consent to telehealth for [facility].  Evaluation Performed:  Preoperative cardiovascular risk assessment _____________   Date:  12/17/2023   Patient ID:  Bradley [name], DOB Jul 26, 1946, MRN [id number] Patient Location:  Home Provider location:   Office  Primary Care Provider:  Jimmey Mould, MD Primary Cardiologist:  Antoinette Batman, MD  Chief Complaint / Patient Profile   78 y.o. y/o male with a h/o CAD s/p CABG x 3 2016, Myoview  October 2024 without ischemia, PAD s/p aorto-bifemoral bypass and right common femoral endarterectomy, PAF on anticoagulation, RBBB, carotid artery stenosis s/p left carotid endarterectomy x 2 and right carotid artery stenting, hypertension, hyperlipidemia, CVA, OSA on CPAP who is pending left long finger trigger release by Dr. Hildy Lowers on 12/23/2023 and presents today for telephonic preoperative cardiovascular risk assessment.  History of Present Illness    Bradley [name] is a 78 y.o. male who presents via audio/video conferencing for a telehealth visit today.  Pt was last seen in cardiology clinic on 05/18/2023 by Dr. Abel Hoe.  At that time Bradley [name] he complained of mild chest pain with moderate exertion.  He underwent stress testing which showed no ischemia.  The patient is now pending procedure as outlined above. Since his last visit, he  is doing well. Patient denies shortness of breath,  dyspnea on exertion, lower extremity edema, orthopnea or PND. No chest pain, pressure, or tightness. No palpitations. He is active performing cardio and weight training twice a week.   Past Medical History    Past Medical History:  Diagnosis Date   Acquired equinus deformity of foot 12/15/2011   Anemia    Arthritis    Carotid artery occlusion    a. Carotid US  10/16: RICA 60-79%, L CEA patent with 1-39% stenosis   CKD (chronic kidney disease)    stage 3-4   Coagulation disorder ([name]) 05/25/2019   Coronary artery disease    a. Myoview  9/16:  EF 52%, inferior fixed defect consistent with diaphragmatic attenuation, intermediate risk secondary to poor exercise tolerance and symptoms during stress;  b. LHC 05/17/2015 90% mid LAD, 80% ost D2, 75% mid RCA, 35% prox RCA. >> S/p CABG   Coronary artery disease involving coronary bypass graft of native heart with angina pectoris ([name])    CORONARY ARTERY BYPASS GRAFTING x 3 - 05/23/2015 Left internal mammary artery to left anterior descending  Saphenous vein graft to diagonal  Saphenous vein graft to posterior descending Endoscopic greater saphenous vein harvest right thigh     Diabetes mellitus age 80   Diverticulitis    Diverticulosis of colon without hemorrhage 05/17/2015   By CT scan    DM type 2, controlled, with complication ([name]) 04/29/2015   Essential hypertension 04/29/2015   GERD (gastroesophageal reflux disease)    H/O hiatal hernia    History of echocardiogram    a. Echo 9/16: GLS -15.2%, EF 55-60%, grade 1 diastolic dysfunction, normal wall motion, aortic sclerosis,  dilated aortic root 39 mm, atrial septal lipomatous hypertrophy   Hyperlipidemia    Hypertension    Irritable bowel syndrome (IBS)    Myocardial infarction ([name])    silent inferior MI; patient denies MI history (03/17/13)    Neuralgia    Neuropathy 2013   OSA on CPAP 05/30/2015   Osteoporosis 2013   PAF (paroxysmal atrial fibrillation) ([name])    post CABG 2016;  Amiodarone  stopped 2/2 wheezing; PAF 01/12/20   Peripheral vascular disease ([name])    Plantar fasciitis 12/15/2011   Pneumonia    Reflux esophagitis    Right bundle branch block 04/30/2015   Sleep apnea    uses CPAP   Stenosis of right internal carotid artery with cerebral infarction ([name]) 12/09/2011   Stress fracture of metatarsal bone 02/18/2012   Stroke ([name]) 1987   Right brain stroke- slight drop on left side   Syncope 01/12/2020   Type 2 diabetes mellitus with vascular disease ([name]) 05/30/2015   Wears glasses    Past Surgical History:  Procedure Laterality Date   ABDOMINAL AORTOGRAM W/LOWER EXTREMITY Bilateral 11/27/2020   Procedure: ABDOMINAL AORTOGRAM W/LOWER EXTREMITY;  Surgeon: Margherita Shell, MD;  Location: [facility] INVASIVE CV LAB;  Service: Cardiovascular;  Laterality: Bilateral;   ANTERIOR CERVICAL DECOMP/DISCECTOMY FUSION N/A 08/25/2023   Procedure: CERVICAL THREE-FOUR, CERVICAL FOUR-FIVE, CERVICAL FIVE-SIX ANTERIOR CERVICAL DECOMPRESSION/DISCECTOMY FUSION;  Surgeon: Van Gelinas, MD;  Location: [facility] OR;  Service: Neurosurgery;  Laterality: N/A;   CARDIAC CATHETERIZATION N/A 05/17/2015   Procedure: Left Heart Cath and Coronary Angiography;  Surgeon: Arty Binning, MD;  Location: [facility] INVASIVE CV LAB;  Service: Cardiovascular;  Laterality: N/A;   CAROTID ENDARTERECTOMY  1994 & redo 2001   Left   CATARACT EXTRACTION W/ INTRAOCULAR LENS IMPLANT Bilateral    COLONOSCOPY W/ BIOPSIES AND POLYPECTOMY     CORONARY ARTERY BYPASS GRAFT N/A 05/23/2015   Procedure: CORONARY ARTERY BYPASS GRAFTING (CABG) x 3 (LIMA to LAD, SVG to DIAGONAL 2, SVG to PDA) with Endoscopic Vein Harvesting from right greater saphenous vein;  Surgeon: Norita Beauvais, MD;  Location: [facility] OR;  Service: Open Heart Surgery;  Laterality: N/A;   ENDARTERECTOMY FEMORAL Right 11/30/2020   Procedure: ENDARTERECTOMY FEMORAL RIGHT;  Surgeon: Mayo Speck, MD;  Location: [facility] OR;  Service: Vascular;  Laterality: Right;    FEMORAL-POPLITEAL BYPASS GRAFT Right 11/30/2020   Procedure: BYPASS GRAFT FEMORAL-POPLITEAL ARTERY RIGHT;  Surgeon: Mayo Speck, MD;  Location: [facility] OR;  Service: Vascular;  Laterality: Right;   FRACTURE SURGERY  2013   Right   foo  t X's 2   ILIAC ARTERY STENT     LAPAROSCOPIC CHOLECYSTECTOMY  09/11/2016   POSTERIOR LUMBAR FUSION  03/24/2013   Level 1   PR VEIN BYPASS GRAFT,AORTO-FEM-POP  1998   SPINE SURGERY  2014   TEE WITHOUT CARDIOVERSION N/A 05/23/2015   Procedure: TRANSESOPHAGEAL ECHOCARDIOGRAM (TEE);  Surgeon: Norita Beauvais, MD;  Location: [facility] OR;  Service: Open Heart Surgery;  Laterality: N/A;   TONSILLECTOMY     TRANSCAROTID ARTERY REVASCULARIZATION  Right 06/01/2020   Procedure: RIGHT TRANSCAROTID ARTERY REVASCULARIZATION;  Surgeon: Margherita Shell, MD;  Location: [facility] OR;  Service: Vascular;  Laterality: Right;   TRIGGER FINGER RELEASE Right 07/14/2017   Procedure: RIGHT LONG FINGER TRIGGER RELEASE;  Surgeon: Rober Chimera, MD;  Location:  SURGERY CENTER;  Service: Orthopedics;  Laterality: Right;    Allergies  Allergies  Allergen Reactions   Doxazosin Mesylate  Other reaction(s): Diarrhea, dizziness   Hydrocodone Nausea Only   Niacin     Other reaction(s): Skin irritation   Oxycodone  Nausea Only   Penicillins Rash    Has patient had a PCN reaction causing immediate rash, facial/tongue/throat swelling, SOB or lightheadedness with hypotension: NO Has patient had a PCN reaction causing severe rash involving mucus membranes or skin necrosis:NO Has patient had a PCN reaction that required hospitalization NO Has patient had a PCN reaction occurring within the last 10 years: NO If all of the above answers are "NO", then may proceed with Cephalosporin use.    Sulfa Drugs Cross Reactors Rash    Home Medications    Prior to Admission medications   Medication Sig Start Date End Date Taking? Authorizing Provider  amLODipine  (NORVASC ) 10 MG tablet Take 1  tablet (10 mg total) by mouth daily. 03/26/20   Arty Binning, MD  apixaban  (ELIQUIS ) 5 MG TABS tablet Take 1 tablet (5 mg total) by mouth 2 (two) times daily. 08/13/21   Arty Binning, MD  aspirin  EC 81 MG tablet Take 81 mg by mouth daily. Swallow whole.    [provider]  atorvastatin  (LIPITOR) 40 MG tablet Take 40 mg by mouth daily.    [provider]  Cholecalciferol  (VITAMIN D3) 125 MCG (5000 UT) TABS Take 5,000 Units by mouth daily.     [provider]  dexlansoprazole (DEXILANT) 60 MG capsule Take 60 mg by mouth daily.     [provider]  Dulaglutide  (TRULICITY ) 3 MG/0.5ML SOPN Inject 3 mg into the skin every Sunday. 05/14/23   [provider]  famotidine  (PEPCID ) 20 MG tablet Take 20 mg by mouth at bedtime. 01/22/22   [provider]  gabapentin  (NEURONTIN ) 300 MG capsule Take 600 mg by mouth at bedtime.  02/20/19   [provider]  gentamicin  ointment (GARAMYCIN ) 0.1 % Apply 1 Application topically daily. Apply to wound daily 11/19/23   Floyce Hutching, DPM  HUMALOG  KWIKPEN 100 UNIT/ML KwikPen Inject 25-30 Units into the skin 3 (three) times daily. 05/15/23   [provider]  hydrALAZINE  (APRESOLINE ) 25 MG tablet Take 1 tablet (25 mg total) by mouth in the morning and at bedtime. 10/08/21   Arty Binning, MD  hydrocortisone  (ANUSOL -HC) 2.5 % rectal cream Place 1 application rectally daily as needed for hemorrhoids.     [provider]  ketoconazole  (NIZORAL ) 2 % shampoo Apply 1 application topically daily as needed for irritation.  02/19/19   [provider]  Melatonin 5 MG CHEW Chew 10 mg by mouth at bedtime. 10/18/20   [provider]  methocarbamol  (ROBAXIN ) 500 MG tablet Take 500 mg by mouth every 6 (six) hours as needed for muscle spasms.  12/08/19   [provider]  metoprolol  tartrate (LOPRESSOR ) 50 MG tablet TAKE 1 AND 1/2 TABLETS BY MOUTH TWICE DAILY 01/17/22   Arty Binning, MD   nitroGLYCERIN  (NITROSTAT ) 0.3 MG SL tablet Place 1 tablet (0.3 mg total) under the tongue every 5 (five) minutes as needed for chest pain. Only as needed 10/08/21   Arty Binning, MD  testosterone  cypionate (DEPOTESTOSTERONE CYPIONATE) 200 MG/ML injection Inject 200 mg into the muscle once a week. 03/17/19   [provider]  torsemide  (DEMADEX ) 20 MG tablet Take 1 tablet (20 mg total) by mouth daily. 10/08/21   Arty Binning, MD  traMADol  (ULTRAM ) 50 MG tablet Take 1-2 tablets (50-100 mg total) by mouth every 6 (  six) hours as needed for moderate pain (pain score 4-6) or severe pain (pain score 7-10). 08/26/23   Easter Golden Caylin, PA-C  TRESIBA  FLEXTOUCH 100 UNIT/ML SOPN FlexTouch Pen Inject 25 Units into the skin daily. 03/30/19   [provider]    Physical Exam    Vital Signs:  Bradley [name] does not have vital signs available for review today.  Given telephonic nature of communication, physical exam is limited. AAOx3. NAD. Normal affect.  Speech and respirations are unlabored.  Accessory Clinical Findings    Cardiac Studies & Procedures   ______________________________________________________________________________________________  STRESS TESTS  MYOCARDIAL PERFUSION IMAGING 06/01/2023  Narrative   Findings are consistent with no ischemia. The study is low risk.   No ST deviation was noted.   LV perfusion is abnormal. There is no evidence of ischemia. There is no evidence of infarction. Defect 1: There is a small defect with mild reduction in uptake present in the apical inferior location(s) that is fixed. There is normal wall motion in the defect area. Consistent with artifact caused by diaphragmatic attenuation.   Left ventricular function is visually normal. Nuclear stress EF: 54%. The left ventricular ejection fraction is mildly decreased (45-54%). End diastolic cavity size is normal. End systolic cavity size is normal.   Prior study available for comparison  from 10/27/2016. No changes compared to prior study.   ECHOCARDIOGRAM  ECHOCARDIOGRAM COMPLETE 06/24/2022  Narrative ECHOCARDIOGRAM REPORT    Patient Name:   Bradley [name] [facility] Date of Exam: 06/24/2022 Medical Rec #:  [id number]       Height:       72.0 in Accession #:    [id number]      Weight:       229.2 lb Date of Birth:  Mar 02, 1946       BSA:          2.258 m Patient Age:    78 years        BP:           157/80 mmHg Patient Gender: M               HR:           78 bpm. Exam Location:  [address]  Procedure: 2D Echo, Cardiac Doppler, Color Doppler and 3D Echo  Indications:    I48.91 Atrial Fibrillation  History:        Patient has prior history of Echocardiogram examinations, most recent 01/13/2020. CAD, Prior CABG, Carotid Disease, Stroke and PVD, CKD, Arrythmias:RBBB; Risk Factors:Diabetes, Hypertension, HLD and Sleep Apnea.  Sonographer:    Juventino Oppenheim RCS Referring Phys: [phone] Durenda Gilford [facility]  IMPRESSIONS   1. Left ventricular ejection fraction, by estimation, is 60 to 65%. The left ventricle has normal function. The left ventricle has no regional wall motion abnormalities. There is mild concentric left ventricular hypertrophy. Left ventricular diastolic parameters are consistent with Grade I diastolic dysfunction (impaired relaxation). 2. Right ventricular systolic function is normal. The right ventricular size is normal. There is normal pulmonary artery systolic pressure. 3. No evidence of mitral valve regurgitation. 4. Aortic valve regurgitation is not visualized. Aortic valve sclerosis/calcification is present, without any evidence of aortic stenosis. 5. The inferior vena cava is normal in size with greater than 50% respiratory variability, suggesting right atrial pressure of 3 mmHg.  Comparison(s): No significant change from prior study.  FINDINGS Left Ventricle: Left ventricular ejection fraction, by estimation, is 60 to 65%. The left ventricle has normal  function.  The left ventricle has no regional wall motion abnormalities. The left ventricular internal cavity size was normal in size. There is mild concentric left ventricular hypertrophy. Left ventricular diastolic parameters are consistent with Grade I diastolic dysfunction (impaired relaxation).  Right Ventricle: The right ventricular size is normal. Right ventricular systolic function is normal. There is normal pulmonary artery systolic pressure. The tricuspid regurgitant velocity is 2.40 m/s, and with an assumed right atrial pressure of 3 mmHg, the estimated right ventricular systolic pressure is 26.0 mmHg.  Left Atrium: Left atrial size was normal in size.  Right Atrium: Right atrial size was normal in size.  Pericardium: There is no evidence of pericardial effusion.  Mitral Valve: No evidence of mitral valve regurgitation.  Tricuspid Valve: Tricuspid valve regurgitation is not demonstrated.  Aortic Valve: Aortic valve regurgitation is not visualized. Aortic valve sclerosis/calcification is present, without any evidence of aortic stenosis. Aortic valve mean gradient measures 5.0 mmHg. Aortic valve peak gradient measures 8.7 mmHg. Aortic valve area, by VTI measures 2.31 cm.  Pulmonic Valve: Pulmonic valve regurgitation is not visualized.  Aorta: The aortic root and ascending aorta are structurally normal, with no evidence of dilitation.  Venous: The inferior vena cava is normal in size with greater than 50% respiratory variability, suggesting right atrial pressure of 3 mmHg.  IAS/Shunts: No atrial level shunt detected by color flow Doppler.   LEFT VENTRICLE PLAX 2D LVIDd:         4.50 cm   Diastology LVIDs:         3.10 cm   LV e' medial:    5.22 cm/s LV PW:         1.20 cm   LV E/e' medial:  19.2 LV IVS:        1.20 cm   LV e' lateral:   7.40 cm/s LVOT diam:     2.10 cm   LV E/e' lateral: 13.5 LV SV:         69 LV SV Index:   30 LVOT Area:     3.46 cm  3D Volume EF: 3D  EF:        50 % LV EDV:       125 ml LV ESV:       62 ml LV SV:        63 ml  RIGHT VENTRICLE RV Basal diam:  3.10 cm RV S prime:     6.53 cm/s TAPSE (M-mode): 1.4 cm RVSP:           26.0 mmHg  LEFT ATRIUM             Index        RIGHT ATRIUM           Index LA diam:        3.90 cm 1.73 cm/m   RA Pressure: 3.00 mmHg LA Vol (A2C):   55.8 ml 24.72 ml/m  RA Area:     12.30 cm LA Vol (A4C):   48.0 ml 21.26 ml/m  RA Volume:   23.10 ml  10.23 ml/m LA Biplane Vol: 54.4 ml 24.10 ml/m AORTIC VALVE AV Area (Vmax):    2.27 cm AV Area (Vmean):   2.25 cm AV Area (VTI):     2.31 cm AV Vmax:           147.50 cm/s AV Vmean:          99.050 cm/s AV VTI:  0.298 m AV Peak Grad:      8.7 mmHg AV Mean Grad:      5.0 mmHg LVOT Vmax:         96.80 cm/s LVOT Vmean:        64.300 cm/s LVOT VTI:          0.198 m LVOT/AV VTI ratio: 0.67  AORTA Ao Root diam: 3.90 cm Ao Asc diam:  3.50 cm  MITRAL VALVE                TRICUSPID VALVE MV Area (PHT):              TR Peak grad:   23.0 mmHg MV Decel Time:              TR Vmax:        240.00 cm/s MV E velocity: 100.00 cm/s  Estimated RAP:  3.00 mmHg MV A velocity: 120.00 cm/s  RVSP:           26.0 mmHg MV E/A ratio:  0.83 SHUNTS Systemic VTI:  0.20 m Systemic Diam: 2.10 cm  Alois Arnt Electronically signed by Alois Arnt Signature Date/Time: 06/24/2022/4:45:42 PM    Final    ______________________________________________________________________________________________       Assessment & Plan    Primary Cardiologist: Antoinette Batman, MD  Preoperative cardiovascular risk assessment.  Left long finger trigger release by Dr. Hildy Lowers on 12/23/2023.  Chart reviewed as part of pre-operative protocol coverage. According to the RCRI, patient has a 11% risk of MACE. Patient reports activity equivalent to >4.0 METS (performs cardio and weight training twice a week).   Given past medical history and time since last visit,  based on ACC/AHA guidelines, Bradley [name] would be at acceptable risk for the planned procedure without further cardiovascular testing.   Patient was advised that if he develops new symptoms prior to surgery to contact our office to arrange a follow-up appointment.  he verbalized understanding.  Per Pharm D, patient may hold Eliquis  for 3 days prior to procedure.  Ideally aspirin  should be continued without interruption, however if the bleeding risk is too great, aspirin  may be held for 5-7 days prior to surgery. Please resume aspirin  post operatively when it is felt to be safe from a bleeding standpoint.    I will route this recommendation to the requesting party via Epic fax function.  Please call with questions.  Time:   Today, I have spent 6 minutes with the patient with telehealth technology discussing medical history, symptoms, and management plan.     Morey Ar, NP  12/17/2023, 2:58 PM

## 2023-12-17 NOTE — Telephone Encounter (Signed)
 Med Rec and consent done     Patient Consent for Virtual Visit        Bradley [name] has provided verbal consent on 12/17/2023 for a virtual visit (video or telephone).   CONSENT FOR VIRTUAL VISIT FOR:  Bradley [name]  By participating in this virtual visit I agree to the following:  I hereby voluntarily request, consent and authorize [facility] and its employed or contracted physicians, physician assistants, nurse practitioners or other licensed health care professionals (the Practitioner), to provide me with telemedicine health care services (the "Services") as deemed necessary by the treating Practitioner. I acknowledge and consent to receive the Services by the Practitioner via telemedicine. I understand that the telemedicine visit will involve communicating with the Practitioner through live audiovisual communication technology and the disclosure of certain medical information by electronic transmission. I acknowledge that I have been given the opportunity to request an in-person assessment or other available alternative prior to the telemedicine visit and am voluntarily participating in the telemedicine visit.  I understand that I have the right to withhold or withdraw my consent to the use of telemedicine in the course of my care at any time, without affecting my right to future care or treatment, and that the Practitioner or I may terminate the telemedicine visit at any time. I understand that I have the right to inspect all information obtained and/or recorded in the course of the telemedicine visit and may receive copies of available information for a reasonable fee.  I understand that some of the potential risks of receiving the Services via telemedicine include:  Delay or interruption in medical evaluation due to technological equipment failure or disruption; Information transmitted may not be sufficient (e.g. poor resolution of images) to allow for appropriate medical  decision making by the Practitioner; and/or  In rare instances, security protocols could fail, causing a breach of personal health information.  Furthermore, I acknowledge that it is my responsibility to provide information about my medical history, conditions and care that is complete and accurate to the best of my ability. I acknowledge that Practitioner's advice, recommendations, and/or decision may be based on factors not within their control, such as incomplete or inaccurate data provided by me or distortions of diagnostic images or specimens that may result from electronic transmissions. I understand that the practice of medicine is not an exact science and that Practitioner makes no warranties or guarantees regarding treatment outcomes. I acknowledge that a copy of this consent can be made available to me via my patient portal [facility] MyChart), or I can request a printed copy by calling the office of [facility].    I understand that my insurance will be billed for this visit.   I have read or had this consent read to me. I understand the contents of this consent, which adequately explains the benefits and risks of the Services being provided via telemedicine.  I have been provided ample opportunity to ask questions regarding this consent and the Services and have had my questions answered to my satisfaction. I give my informed consent for the services to be provided through the use of telemedicine in my medical care

## 2023-12-17 NOTE — Telephone Encounter (Signed)
   Name: Bradley [name]  DOB: 02/27/1946  MRN: [id number]  Primary Cardiologist: Antoinette Batman, MD   Preoperative team, please contact this patient and set up a phone call appointment for further preoperative risk assessment. Please obtain consent and complete medication review. Thank you for your help.  Patient will need telephone visit today or tomorrow please.  I confirm that guidance regarding antiplatelet and oral anticoagulation therapy has been completed and, if necessary, noted below.  Per Pharm D, patient may hold Eliquis  for 3 days prior to procedure.    I also confirmed the patient resides in the state of  . As per [facility] Medical Board telemedicine laws, the patient must reside in the state in which the provider is licensed.   Morey Ar, NP 12/17/2023, 11:16 AM [facility]

## 2023-12-17 NOTE — Telephone Encounter (Signed)
 1st attempt to reach pt regarding the need for a tele appt.  Per APP, pt needs to be added to today or tomorrow.  Left a message for pt to call back.

## 2023-12-22 ENCOUNTER — Ambulatory Visit: Attending: Vascular Surgery | Admitting: Physician Assistant

## 2023-12-22 VITALS — BP 118/74 | HR 83 | Temp 98.4°F | Wt 210.3 lb

## 2023-12-22 DIAGNOSIS — I739 Peripheral vascular disease, unspecified: Secondary | ICD-10-CM | POA: Diagnosis not present

## 2023-12-22 NOTE — Progress Notes (Signed)
 [facility] [place] HISTORY AND PHYSICAL   History of Present Illness:  Patient is a 78 y.o. year old male who presents for evaluation of right 4th toe non healing wound.  He is status post right femoral endarterectomy with femoral to above-knee popliteal artery bypass on 11/30/2020 with Dr. Shirley Douglas. Prior to the bypass DR. Brabham performed angiogram which showed:  Findings:              Aortogram: Renal arteries were not well visualized.  The native aorta is widely patent as is the aortobifemoral graft.  The proximal anastomosis is widely patent.  Both limbs of the graft are without stenosis.  However, at the anastomosis on the right side there is a near total occlusive lesion             Right Lower Extremity: Nearly occlusive lesion within the right femoral anastomosis.  The profundofemoral artery is widely patent.  There is a flush occlusion of the right superficial femoral artery with reconstitution of the above-knee popliteal artery in the adductor canal with three-vessel runoff             Left Lower Extremity: The femoral anastomosis in the left groin is widely patent.  The profundofemoral artery is patent without stenosis the superficial femoral artery is occluded with reconstitution of the popliteal artery in the adductor canal.  Runoff evaluation was somewhat limited however there appears to be a posterior tibial that crosses the ankle as well as a patent peroneal   He was seen in our office on 11/03/22 by Dr. Edgardo Goodwill.  At that time DR. Edgardo Goodwill thought the bypass was clinically thrombosed.  He and the patient decided to wait and watch the 4th toe for healing.  He is here today for exam.     He states he does go to a gym.  He has balance issues and is not able to walk as much as he would like.  He notes that the 4th toe has healed.  He denies rest pain or claudication with short distance walking.  He denies new wounds.       Past Medical History:  Diagnosis Date    Acquired equinus deformity of foot 12/15/2011   Anemia    Arthritis    Carotid artery occlusion    a. Carotid US  10/16: RICA 60-79%, L CEA patent with 1-39% stenosis   CKD (chronic kidney disease)    stage 3-4   Coagulation disorder ([name]) 05/25/2019   Coronary artery disease    a. Myoview  9/16:  EF 52%, inferior fixed defect consistent with diaphragmatic attenuation, intermediate risk secondary to poor exercise tolerance and symptoms during stress;  b. LHC 05/17/2015 90% mid LAD, 80% ost D2, 75% mid RCA, 35% prox RCA. >> S/p CABG   Coronary artery disease involving coronary bypass graft of native heart with angina pectoris ([name])    CORONARY ARTERY BYPASS GRAFTING x 3 - 05/23/2015 Left internal mammary artery to left anterior descending  Saphenous vein graft to diagonal  Saphenous vein graft to posterior descending Endoscopic greater saphenous vein harvest right thigh     Diabetes mellitus age 74   Diverticulitis    Diverticulosis of colon without hemorrhage 05/17/2015   By CT scan    DM type 2, controlled, with complication ([name]) 04/29/2015   Essential hypertension 04/29/2015   GERD (gastroesophageal reflux disease)    H/O hiatal hernia    History of echocardiogram    a. Echo 9/16: GLS -15.2%,  EF 55-60%, grade 1 diastolic dysfunction, normal wall motion, aortic sclerosis, dilated aortic root 39 mm, atrial septal lipomatous hypertrophy   Hyperlipidemia    Hypertension    Irritable bowel syndrome (IBS)    Myocardial infarction ([name])    silent inferior MI; patient denies MI history (03/17/13)    Neuralgia    Neuropathy 2013   OSA on CPAP 05/30/2015   Osteoporosis 2013   PAF (paroxysmal atrial fibrillation) ([name])    post CABG 2016; Amiodarone  stopped 2/2 wheezing; PAF 01/12/20   Peripheral vascular disease ([name])    Plantar fasciitis 12/15/2011   Pneumonia    Reflux esophagitis    Right bundle branch block 04/30/2015   Sleep apnea    uses CPAP   Stenosis of right internal carotid  artery with cerebral infarction ([name]) 12/09/2011   Stress fracture of metatarsal bone 02/18/2012   Stroke ([name]) 1987   Right brain stroke- slight drop on left side   Syncope 01/12/2020   Type 2 diabetes mellitus with vascular disease ([name]) 05/30/2015   Wears glasses     Past Surgical History:  Procedure Laterality Date   ABDOMINAL AORTOGRAM W/LOWER EXTREMITY Bilateral 11/27/2020   Procedure: ABDOMINAL AORTOGRAM W/LOWER EXTREMITY;  Surgeon: Margherita Shell, MD;  Location: [facility]AS[facility];  Service: Cardiovascular;  Laterality: Bilateral;   ANTERIOR CERVICAL DECOMP/DISCECTOMY FUSION N/A 08/25/2023   Procedure: CERVICAL THREE-FOUR, CERVICAL FOUR-FIVE, CERVICAL FIVE-SIX ANTERIOR CERVICAL DECOMPRESSION/DISCECTOMY FUSION;  Surgeon: Van Gelinas, MD;  Location: [facility] OR;  Service: Neurosurgery;  Laterality: N/A;   CARDIAC CATHETERIZATION N/A 05/17/2015   Procedure: Left Heart Cath and Coronary Angiography;  Surgeon: Arty Binning, MD;  Location: [facility] INVASIVE CV LAB;  Service: Cardiovascular;  Laterality: N/A;   CAROTID ENDARTERECTOMY  1994 & redo 2001   Left   CATARACT EXTRACTION W/ INTRAOCULAR LENS IMPLANT Bilateral    COLONOSCOPY W/ BIOPSIES AND POLYPECTOMY     CORONARY ARTERY BYPASS GRAFT N/A 05/23/2015   Procedure: CORONARY ARTERY BYPASS GRAFTING (CABG) x 3 (LIMA to LAD, SVG to DIAGONAL 2, SVG to PDA) with Endoscopic Vein Harvesting from right greater saphenous vein;  Surgeon: Norita Beauvais, MD;  Location: [facility] OR;  Service: Open Heart Surgery;  Laterality: N/A;   ENDARTERECTOMY FEMORAL Right 11/30/2020   Procedure: ENDARTERECTOMY FEMORAL RIGHT;  Surgeon: Mayo Speck, MD;  Location: [facility] OR;  Service: Vascular;  Laterality: Right;   FEMORAL-POPLITEAL BYPASS GRAFT Right 11/30/2020   Procedure: BYPASS GRAFT FEMORAL-POPLITEAL ARTERY RIGHT;  Surgeon: Mayo Speck, MD;  Location: [facility] OR;  Service: Vascular;  Laterality: Right;   FRACTURE SURGERY  2013   Right   foo  t X's 2   ILIAC ARTERY  STENT     LAPAROSCOPIC CHOLECYSTECTOMY  09/11/2016   POSTERIOR LUMBAR FUSION  03/24/2013   Level 1   PR VEIN BYPASS GRAFT,AORTO-FEM-POP  1998   SPINE SURGERY  2014   TEE WITHOUT CARDIOVERSION N/A 05/23/2015   Procedure: TRANSESOPHAGEAL ECHOCARDIOGRAM (TEE);  Surgeon: Norita Beauvais, MD;  Location[facility] OR;  Service: Open Heart Surgery;  Laterality: N/A;   TONSILLECTOMY     TRANSCAROTID ARTERY REVASCULARIZATION  Right 06/01/2020   Procedure: RIGHT TRANSCAROTID ARTERY REVASCULARIZATION;  Surgeon: Margherita Shell, MD;  Location: [facility] OR;  Service: Vascular;  Laterality: Right;   TRIGGER FINGER RELEASE Right 07/14/2017   Procedure: RIGHT LONG FINGER TRIGGER RELEASE;  Surgeon: Rober Chimera, MD;  Location: [facility];  Service: Orthopedics;  Laterality: Right;    ROS:  General:  No weight loss, Fever, chills  HEENT: No recent headaches, no nasal bleeding, no visual changes, no sore throat  Neurologic: No dizziness, blackouts, seizures. No recent symptoms of stroke or mini- stroke. No recent episodes of slurred speech, or temporary blindness.  Cardiac: No recent episodes of chest pain/pressure, no shortness of breath at rest.  No shortness of breath with exertion.  Denies history of atrial fibrillation or irregular heartbeat  Vascular: No history of rest pain in feet.  No history of claudication.  positive history of non-healing ulcer, No history of DVT   Pulmonary: No home oxygen, no productive cough, no hemoptysis,  No asthma or wheezing  Musculoskeletal:  [ ]  Arthritis, [ ]  Low back pain,  [ ]  Joint pain  Hematologic:No history of hypercoagulable state.  No history of easy bleeding.  No history of anemia  Gastrointestinal: No hematochezia or melena,  No gastroesophageal reflux, no trouble swallowing  Urinary: [ ]  chronic Kidney disease, [ ]  on HD - [ ]  MWF or [ ]  TTHS, [ ]  Burning with urination, [ ]  Frequent urination, [ ]  Difficulty urinating;   Skin: No  rashes  Psychological: No history of anxiety,  No history of depression  Social History Social History   Tobacco Use   Smoking status: Former    Current packs/day: 0.00    Average packs/day: 2.0 packs/day for 50.0 years (100.0 ttl pk-yrs)    Types: Cigarettes    Start date: 03/27/1959    Quit date: 03/26/2009    Years since quitting: 14.7   Smokeless tobacco: Never  Vaping Use   Vaping status: Never Used  Substance Use Topics   Alcohol use: No   Drug use: No    Family History Family History  Problem Relation Age of Onset   Cancer Mother 63       pancreatic   Hyperlipidemia Mother    Stroke Father 54   Deep vein thrombosis Father    Hyperlipidemia Father    Hypertension Father     Allergies  Allergies  Allergen Reactions   Doxazosin Mesylate     Other reaction(s): Diarrhea, dizziness   Hydrocodone Nausea Only   Niacin     Other reaction(s): Skin irritation   Oxycodone  Nausea Only   Penicillins Rash    Has patient had a PCN reaction causing immediate rash, facial/tongue/throat swelling, SOB or lightheadedness with hypotension: NO Has patient had a PCN reaction causing severe rash involving mucus membranes or skin necrosis:NO Has patient had a PCN reaction that required hospitalization NO Has patient had a PCN reaction occurring within the last 10 years: NO If all of the above answers are "NO", then may proceed with Cephalosporin use.    Sulfa Drugs Cross Reactors Rash     Current Outpatient Medications  Medication Sig Dispense Refill   amLODipine  (NORVASC ) 10 MG tablet Take 1 tablet (10 mg total) by mouth daily. 90 tablet 3   apixaban  (ELIQUIS ) 5 MG TABS tablet Take 1 tablet (5 mg total) by mouth 2 (two) times daily. 180 tablet 1   aspirin  EC 81 MG tablet Take 81 mg by mouth daily. Swallow whole.     atorvastatin  (LIPITOR) 40 MG tablet Take 40 mg by mouth daily.     Cholecalciferol  (VITAMIN D3) 125 MCG (5000 UT) TABS Take 5,000 Units by mouth daily.       dexlansoprazole (DEXILANT) 60 MG capsule Take 60 mg by mouth daily.      Dulaglutide  (TRULICITY ) 3 MG/0.5ML  SOPN Inject 3 mg into the skin every Sunday.     famotidine  (PEPCID ) 20 MG tablet Take 20 mg by mouth at bedtime.     gabapentin  (NEURONTIN ) 300 MG capsule Take 600 mg by mouth at bedtime.      gentamicin  ointment (GARAMYCIN ) 0.1 % Apply 1 Application topically daily. Apply to wound daily 30 g 0   HUMALOG  KWIKPEN 100 UNIT/ML KwikPen Inject 25-30 Units into the skin 3 (three) times daily.     hydrALAZINE  (APRESOLINE ) 25 MG tablet Take 1 tablet (25 mg total) by mouth in the morning and at bedtime. 180 tablet 2   hydrocortisone  (ANUSOL -HC) 2.5 % rectal cream Place 1 application rectally daily as needed for hemorrhoids.      ketoconazole  (NIZORAL ) 2 % shampoo Apply 1 application topically daily as needed for irritation.      Melatonin 5 MG CHEW Chew 10 mg by mouth at bedtime.     methocarbamol  (ROBAXIN ) 500 MG tablet Take 500 mg by mouth every 6 (six) hours as needed for muscle spasms.      metoprolol  tartrate (LOPRESSOR ) 50 MG tablet TAKE 1 AND 1/2 TABLETS BY MOUTH TWICE DAILY 270 tablet 0   nitroGLYCERIN  (NITROSTAT ) 0.3 MG SL tablet Place 1 tablet (0.3 mg total) under the tongue every 5 (five) minutes as needed for chest pain. Only as needed 90 tablet 0   testosterone  cypionate (DEPOTESTOSTERONE CYPIONATE) 200 MG/ML injection Inject 200 mg into the muscle once a week.     torsemide  (DEMADEX ) 20 MG tablet Take 1 tablet (20 mg total) by mouth daily. 90 tablet 3   traMADol  (ULTRAM ) 50 MG tablet Take 1-2 tablets (50-100 mg total) by mouth every 6 (six) hours as needed for moderate pain (pain score 4-6) or severe pain (pain score 7-10). 30 tablet 0   TRESIBA  FLEXTOUCH 100 UNIT/ML SOPN FlexTouch Pen Inject 25 Units into the skin daily.     No current facility-administered medications for this visit.    Physical Examination  Vitals:   12/22/23 1226  BP: 118/74  Pulse: 83  Temp: 98.4 F (36.9  C)  TempSrc: Temporal  SpO2: 95%  Weight: 210 lb 4.8 oz (95.4 kg)    Body mass index is 29.75 kg/m.  General:  Alert and oriented, no acute distress HEENT: Normal Neck: No bruit or JVD Pulmonary: Clear to auscultation bilaterally Cardiac: Regular Rate and Rhythm without murmur Abdomen: Soft, non-tender, non-distended, no mass, no scars Skin: No rash  Extremity Pulses:   radial, right femoral palpable, no pulse in the left femoral.  He has good monophasic signals B PT. Musculoskeletal: No deformity or edema  Neurologic: Upper and lower extremity motor 5/5 and symmetric     ASSESSMENT/PLAN:  PAD Status post right femoral endarterectomy with femoral to above-knee popliteal artery bypass on 11/30/2020 with Dr. Shirley Douglas. He was seen by Dr. Edgardo Goodwill on 11/03/23 and at that time he sated that clinically the bypass was occluded.  He has healed the 4th wound full and has a PT monophasic signal.  I asked him to increase his activity and keep going to his gym.  He will f/u in 6 months for repeat studies.  If he has new symptoms of ischemia he will call our office.   Rocky Cipro PA-C [facility] [place] Office: [phone]  MD in clinic [place]

## 2023-12-24 DIAGNOSIS — I739 Peripheral vascular disease, unspecified: Secondary | ICD-10-CM | POA: Diagnosis not present

## 2023-12-24 DIAGNOSIS — E291 Testicular hypofunction: Secondary | ICD-10-CM | POA: Diagnosis not present

## 2023-12-24 DIAGNOSIS — K219 Gastro-esophageal reflux disease without esophagitis: Secondary | ICD-10-CM | POA: Diagnosis not present

## 2023-12-24 DIAGNOSIS — Z79899 Other long term (current) drug therapy: Secondary | ICD-10-CM | POA: Diagnosis not present

## 2023-12-24 DIAGNOSIS — M545 Low back pain, unspecified: Secondary | ICD-10-CM | POA: Diagnosis not present

## 2023-12-24 DIAGNOSIS — Z6830 Body mass index (BMI) 30.0-30.9, adult: Secondary | ICD-10-CM | POA: Diagnosis not present

## 2023-12-25 ENCOUNTER — Other Ambulatory Visit: Payer: Self-pay | Admitting: *Deleted

## 2023-12-25 DIAGNOSIS — I739 Peripheral vascular disease, unspecified: Secondary | ICD-10-CM

## 2024-01-01 ENCOUNTER — Encounter (HOSPITAL_BASED_OUTPATIENT_CLINIC_OR_DEPARTMENT_OTHER): Admitting: Physical Therapy

## 2024-01-07 ENCOUNTER — Ambulatory Visit (HOSPITAL_BASED_OUTPATIENT_CLINIC_OR_DEPARTMENT_OTHER): Attending: Neurosurgery | Admitting: Physical Therapy

## 2024-01-08 ENCOUNTER — Encounter (HOSPITAL_BASED_OUTPATIENT_CLINIC_OR_DEPARTMENT_OTHER): Payer: Self-pay

## 2024-01-08 ENCOUNTER — Encounter (HOSPITAL_BASED_OUTPATIENT_CLINIC_OR_DEPARTMENT_OTHER): Admitting: Physical Therapy

## 2024-01-13 ENCOUNTER — Ambulatory Visit (HOSPITAL_BASED_OUTPATIENT_CLINIC_OR_DEPARTMENT_OTHER): Attending: Neurosurgery | Admitting: Physical Therapy

## 2024-01-13 DIAGNOSIS — M542 Cervicalgia: Secondary | ICD-10-CM | POA: Diagnosis not present

## 2024-01-13 DIAGNOSIS — R2689 Other abnormalities of gait and mobility: Secondary | ICD-10-CM | POA: Diagnosis not present

## 2024-01-13 DIAGNOSIS — E1142 Type 2 diabetes mellitus with diabetic polyneuropathy: Secondary | ICD-10-CM | POA: Diagnosis not present

## 2024-01-13 NOTE — Therapy (Signed)
 OUTPATIENT PHYSICAL THERAPY CERVICAL EVALUATION   Patient Name: Bradley [name] MRN: [id number] DOB:01-16-1946, 78 y.o., male Today's Date: 01/14/2024  END OF SESSION:    Past Medical History:  Diagnosis Date   Acquired equinus deformity of foot 12/15/2011   Anemia    Arthritis    Carotid artery occlusion    a. Carotid US  10/16: RICA 60-79%, L CEA patent with 1-39% stenosis   CKD (chronic kidney disease)    stage 3-4   Coagulation disorder ([name]) 05/25/2019   Coronary artery disease    a. Myoview  9/16:  EF 52%, inferior fixed defect consistent with diaphragmatic attenuation, intermediate risk secondary to poor exercise tolerance and symptoms during stress;  b. LHC 05/17/2015 90% mid LAD, 80% ost D2, 75% mid RCA, 35% prox RCA. >> S/p CABG   Coronary artery disease involving coronary bypass graft of native heart with angina pectoris ([name])    CORONARY ARTERY BYPASS GRAFTING x 3 - 05/23/2015 Left internal mammary artery to left anterior descending  Saphenous vein graft to diagonal  Saphenous vein graft to posterior descending Endoscopic greater saphenous vein harvest right thigh     Diabetes mellitus age 64   Diverticulitis    Diverticulosis of colon without hemorrhage 05/17/2015   By CT scan    DM type 2, controlled, with complication ([name]) 04/29/2015   Essential hypertension 04/29/2015   GERD (gastroesophageal reflux disease)    H/O hiatal hernia    History of echocardiogram    a. Echo 9/16: GLS -15.2%, EF 55-60%, grade 1 diastolic dysfunction, normal wall motion, aortic sclerosis, dilated aortic root 39 mm, atrial septal lipomatous hypertrophy   Hyperlipidemia    Hypertension    Irritable bowel syndrome (IBS)    Myocardial infarction ([name])    silent inferior MI; patient denies MI history (03/17/13)    Neuralgia    Neuropathy 2013   OSA on CPAP 05/30/2015   Osteoporosis 2013   PAF (paroxysmal atrial fibrillation) ([name])    post CABG 2016; Amiodarone  stopped 2/2 wheezing; PAF  01/12/20   Peripheral vascular disease ([name])    Plantar fasciitis 12/15/2011   Pneumonia    Reflux esophagitis    Right bundle branch block 04/30/2015   Sleep apnea    uses CPAP   Stenosis of right internal carotid artery with cerebral infarction ([name]) 12/09/2011   Stress fracture of metatarsal bone 02/18/2012   Stroke ([name]) 1987   Right brain stroke- slight drop on left side   Syncope 01/12/2020   Type 2 diabetes mellitus with vascular disease ([name]) 05/30/2015   Wears glasses    Past Surgical History:  Procedure Laterality Date   ABDOMINAL AORTOGRAM W/LOWER EXTREMITY Bilateral 11/27/2020   Procedure: ABDOMINAL AORTOGRAM W/LOWER EXTREMITY;  Surgeon: Margherita Shell, MD;  Location: [facility] IN[facility]ASIVE [facility];  Service: Cardiovascular;  Laterality: Bilateral;   ANTERIOR CERVICAL DECOMP/DISCECTOMY FUSION N/A 08/25/2023   Procedure: CERVICAL THREE-FOUR, CERVICAL FOUR-FIVE, CERVICAL FIVE-SIX ANTERIOR CERVICAL DECOMPRESSION/DISCECTOMY FUSION;  Surgeon: Van Gelinas, MD;  Location: [facility] OR;  Service: Neurosurgery;  Laterality: N/A;   CARDIAC CATHETERIZATION N/A 05/17/2015   Procedure: Left Heart Cath and Coronary Angiography;  Surgeon: Arty Binning, MD;  Location: [facility] INVASIVE CV LAB;  Service: Cardiovascular;  Laterality: N/A;   CAROTID ENDARTERECTOMY  1994 & redo 2001   Left   CATARACT EXTRACTION W/ INTRAOCULAR LENS IMPLANT Bilateral    COLONOSCOPY W/ BIOPSIES AND POLYPECTOMY     CORONARY ARTERY BYPASS GRAFT N/A 05/23/2015   Procedure: CORONARY ARTERY BYPASS  GRAFTING (CABG) x 3 (LIMA to LAD, SVG to DIAGONAL 2, SVG to PDA) with Endoscopic Vein Harvesting from right greater saphenous vein;  Surgeon: Norita Beauvais, MD;  Location: [facility] OR;  Service: Open Heart Surgery;  Laterality: N/A;   ENDARTERECTOMY FEMORAL Right 11/30/2020   Procedure: ENDARTERECTOMY FEMORAL RIGHT;  Surgeon: Mayo Speck, MD;  Location: [facility] OR;  Service: Vascular;  Laterality: Right;   FEMORAL-POPLITEAL BYPASS GRAFT Right  11/30/2020   Procedure: BYPASS GRAFT FEMORAL-POPLITEAL ARTERY RIGHT;  Surgeon: Mayo Speck, MD;  Location: [facility] OR;  Service: Vascular;  Laterality: Right;   FRACTURE SURGERY  2013   Right   foo  t X's 2   ILIAC ARTERY STENT     LAPAROSCOPIC CHOLECYSTECTOMY  09/11/2016   POSTERIOR LUMBAR FUSION  03/24/2013   Level 1   PR VEIN BYPASS GRAFT,AORTO-FEM-POP  1998   SPINE SURGERY  2014   TEE WITHOUT CARDIOVERSION N/A 05/23/2015   Procedure: TRANSESOPHAGEAL ECHOCARDIOGRAM (TEE);  Surgeon: Norita Beauvais, MD;  Location: [facility] OR;  Service: Open Heart Surgery;  Laterality: N/A;   TONSILLECTOMY     TRANSCAROTID ARTERY REVASCULARIZATION  Right 06/01/2020   Procedure: RIGHT TRANSCAROTID ARTERY REVASCULARIZATION;  Surgeon: Margherita Shell, MD;  Location: [facility] OR;  Service: Vascular;  Laterality: Right;   TRIGGER FINGER RELEASE Right 07/14/2017   Procedure: RIGHT LONG FINGER TRIGGER RELEASE;  Surgeon: Rober Chimera, MD;  Location: [facility];  Service: Orthopedics;  Laterality: Right;   Patient Active Problem List   Diagnosis Date Noted   Cervical myelopathy ([name]) 08/25/2023   Diabetic neuropathy ([name]) 06/03/2022   PAD (peripheral artery disease) ([name]) 11/30/2020   Carotid stenosis 06/01/2020   Carotid stenosis, asymptomatic 06/01/2020   Syncope 01/12/2020   Coagulation disorder ([name]) 05/25/2019   Coronary artery disease involving coronary bypass graft of native heart with angina pectoris ([name])    PAF (paroxysmal atrial fibrillation) ([name])    Type 2 diabetes mellitus with vascular disease ([name]) 05/30/2015   OSA on CPAP 05/30/2015   Abnormal stress test 05/18/2015   Diverticulosis of colon without hemorrhage 05/17/2015   Right bundle branch block 04/30/2015   Essential hypertension 04/29/2015   Hyperlipidemia 04/29/2015   DM type 2, controlled, with complication ([name]) 04/29/2015   Peripheral vascular disease ([name]) 06/08/2012   Stress fracture of metatarsal bone 02/18/2012    Acquired equinus deformity of foot 12/15/2011   Plantar fasciitis 12/15/2011   Stenosis of right internal carotid artery with cerebral infarction ([name]) 12/09/2011    PCP: Roxanna Coppersmith MD  REFERRING PROVIDER: Van Gelinas, MD  REFERRING DIAG: [phone] (ICD-10-CM) - Other spondylosis with myelopathy, cervical region   THERAPY DIAG:  No diagnosis found.  Rationale for Evaluation and Treatment: Rehabilitation  ONSET DATE: DOS: 1/14 fusion of c3-c6  SUBJECTIVE:  SUBJECTIVE STATEMENT: Patient reports he continues to have significant pain in his neck.  He reports the soreness is mostly on the right side.  He reports some days are better than others.  For some days he has significant pain.   Eval: Patient has long history of cervical pain.  He is found to have cervical spinal cord compression.  He has cervical fixation from C3-C6 08/25/2023.  He has increased pain since that point.  He reports pain with starts in his neck and goes down into both shoulders.  He also reports weakness in bilateral upper extremities.    Hand dominance: Right  PERTINENT HISTORY:  Anemia, acquired equinus deformity; CAD< CKD, DMII, IBS, MI, MI, neuralgia, PF, carotid stenosis Stroke, Torn Rotator cuff; CABG; vein bypass graft 1998; Spine surgery in 2014     PAIN:  PAIN:  Are you having pain? Yes: NPRS scale: 5/10 Pain location: cervical spine into bilateral shoulders  Pain description: aching Aggravating factors: UE activity  Relieving factors:  rest  PRECAUTIONS: None  RED FLAGS: None     WEIGHT BEARING RESTRICTIONS: No  FALLS:  Has patient fallen in last 6 months? No    OCCUPATION:  Lives   PLOF: Independent  PATIENT GOALS: get pain down and get stronger  NEXT MD VISIT:  12/10/2023  OBJECTIVE:  Note: Objective measures were completed at Evaluation unless otherwise noted.  DIAGNOSTIC FINDINGS:  Surgery: CERVICAL THREE-FOUR, CERVICAL FOUR-FIVE, CERVICAL FIVE-SIX ANTERIOR CERVICAL DECOMPRESSION/DISCECTOMY FUSION   PATIENT SURVEYS:  NDI    COGNITION: Overall cognitive status: Within functional limits for tasks assessed  SENSATION: WFL  POSTURE: rounded shoulders, forward head, and flexed trunk   PALPATION: Spasming in cervical spine and into peri-scapular area  CERVICAL ROM:   Active ROM A/PROM (deg) eval  Flexion 25  Extension 20  Right lateral flexion 2  Left lateral flexion 2  Right rotation 42  Left rotation 40   (Blank rows = not tested)  UPPER EXTREMITY ROM:  Active ROM Right eval Left eval  Shoulder flexion 135 120  Shoulder extension    Shoulder abduction 155 145  Shoulder adduction    Shoulder extension    Shoulder internal rotation T12 T12  Shoulder external rotation T1 Base of occiput  Elbow flexion    Elbow extension    Wrist flexion    Wrist extension    Wrist ulnar deviation    Wrist radial deviation    Wrist pronation    Wrist supination     (Blank rows = not tested)  UPPER EXTREMITY MMT:  MMT Right eval Left eval  Shoulder flexion 4 4-  Shoulder extension    Shoulder abduction    Shoulder adduction    Shoulder extension    Shoulder internal rotation    Shoulder external rotation    Middle trapezius    Lower trapezius    Elbow flexion    Elbow extension    Wrist flexion    Wrist extension    Wrist ulnar deviation    Wrist radial deviation    Wrist pronation    Wrist supination    Grip strength     (Blank rows = not tested)  Grip test:  R: 50lbs  L: 20lbs  CERVICAL SPECIAL TESTS:    FUNCTIONAL TESTS:  Feet together eyes closed: able to complete, moderate swaying Tandem eyes closed: able to complete moderate swaying  Treatment Date: 6/4 Manual:  Trigger point release to upper  traps, cervical paraspinals, levator. Suboccipital release  Seated PA mobilization of the thoracic spine.   There-ex:  Attempted to have patient do levator and upper trap stretch. Patient had pain on the left side with no real strethc on the right. He was advised to hold on those   Neuro-re-ed    Bilateral er 3x10 yellow  Bilateral horizontal abduction 3x10 yellow  Bilateral flexion  3x10 yellow band   4/22    Manual:  Skilled palpation of trigger points     Neuro-Re-ed:  Bilateral er 3x10 yellow Horizontal abduction 3x10 yellow      PATIENT EDUCATION:  Education details: Symptom management, HEP, self care Person educated: Patient Education method: Explanation, Demonstration, Tactile cues, Verbal cues, and Handouts Education comprehension: verbalized understanding and returned demonstration  HOME EXERCISE PROGRAM:   ASSESSMENT:  CLINICAL IMPRESSION: The patient had tenderness to palaption in his mid cervical spine today. He had very little tenderness to palpation in his upper traps suprisingly. We attempted to have him do strengthening but he was limited by pain and ROM. We reviewed a seated program for him. He is having some trouble in his legs but per patient this is 2nd to a collapsed stent.  in  Patient is a 78 year old male status post C3-C6 cervical fixation on 08/24/2022.  At this point he presents with limited upper extremity strength, limited cervical mobility, and pain with functional activities.  He has decreased grip strength on the left compared to the right.  He would benefit from skilled therapy to improve ability to use arms for functional activity.  OBJECTIVE IMPAIRMENTS: Abnormal gait, decreased activity tolerance, decreased balance, decreased endurance, difficulty walking, decreased ROM, decreased strength, increased fascial restrictions, increased muscle spasms, postural dysfunction, and pain.   ACTIVITY LIMITATIONS: carrying, lifting, sitting,  standing, squatting, transfers, dressing, self feeding, reach over head, and hygiene/grooming  PARTICIPATION LIMITATIONS: meal prep, cleaning, laundry, driving, shopping, community activity, and yard work  PERSONAL FACTORS: 3+ comorbidities: multi joint OA, low back pain, right leg stent failure  are also affecting patient's functional outcome.   REHAB POTENTIAL: Good  CLINICAL DECISION MAKING: Evolving/moderate complexity  EVALUATION COMPLEXITY: Moderate   GOALS: Goals reviewed with patient? Yes  SHORT TERM GOALS: Target date: 12/29/2023    Patient will increase cervical rotation by 10 degrees  Baseline:  Goal status: no change at this time 6/5   2.  Patient will demonstrate full flexion of bilateral shoulders  Baseline:  Goal status: INITIAL  3.  Patient will increase grip strength by 10 lbs  Baseline:  Goal status: INITIAL L  LONG TERM GOALS: Target date: 01/26/2024    Patient will perform ADL's without neck pain  Baseline:  Goal status: INITIAL  2.  Patient will demonstrate 50 degrees of cervical rotation in order to drive  Baseline:  Goal status: INITIAL  3.  Patient will demonstrate good functional balance in order to improve safety  Baseline:  Goal status: INITIAL     PLAN:  PT FREQUENCY: 1-2x/week  PT DURATION: 8 weeks  PLANNED INTERVENTIONS:   PLAN FOR NEXT SESSION: Consider trigger point release to bilateral upper trap, cervical paraspinals, and parascapular muscles.  Continue with postural exercises.   Kitty Perkins, PT 01/14/2024, 9:29 AM

## 2024-01-14 ENCOUNTER — Encounter (HOSPITAL_BASED_OUTPATIENT_CLINIC_OR_DEPARTMENT_OTHER): Payer: Self-pay | Admitting: Physical Therapy

## 2024-01-20 ENCOUNTER — Ambulatory Visit (HOSPITAL_BASED_OUTPATIENT_CLINIC_OR_DEPARTMENT_OTHER): Admitting: Physical Therapy

## 2024-01-20 ENCOUNTER — Encounter (HOSPITAL_BASED_OUTPATIENT_CLINIC_OR_DEPARTMENT_OTHER): Payer: Self-pay | Admitting: Physical Therapy

## 2024-01-20 DIAGNOSIS — R2689 Other abnormalities of gait and mobility: Secondary | ICD-10-CM

## 2024-01-20 DIAGNOSIS — M542 Cervicalgia: Secondary | ICD-10-CM | POA: Diagnosis not present

## 2024-01-20 NOTE — Therapy (Signed)
 OUTPATIENT PHYSICAL THERAPY CERVICAL EVALUATION   Patient Name: Bradley [name] MRN: [id number] DOB:08-04-1946, 78 y.o., male Today's Date: 01/21/2024  END OF SESSION:  PT End of Session - 01/20/24 1339     Visit Number 3    Number of Visits 16    Date for PT Re-Evaluation 01/27/24    Authorization Type progress note done on visit 12 next to be done on 22    PT Start Time 1300    PT Stop Time 1343    PT Time Calculation (min) 43 min    Activity Tolerance Patient tolerated treatment well;No increased pain    Behavior During Therapy [facility] for tasks assessed/performed           Past Medical History:  Diagnosis Date   Acquired equinus deformity of foot 12/15/2011   Anemia    Arthritis    Carotid artery occlusion    a. Carotid US  10/16: RICA 60-79%, L CEA patent with 1-39% stenosis   CKD (chronic kidney disease)    stage 3-4   Coagulation disorder ([name]) 05/25/2019   Coronary artery disease    a. Myoview  9/16:  EF 52%, inferior fixed defect consistent with diaphragmatic attenuation, intermediate risk secondary to poor exercise tolerance and symptoms during stress;  b. LHC 05/17/2015 90% mid LAD, 80% ost D2, 75% mid RCA, 35% prox RCA. >> S/p CABG   Coronary artery disease involving coronary bypass graft of native heart with angina pectoris ([name])    CORONARY ARTERY BYPASS GRAFTING x 3 - 05/23/2015 Left internal mammary artery to left anterior descending  Saphenous vein graft to diagonal  Saphenous vein graft to posterior descending Endoscopic greater saphenous vein harvest right thigh     Diabetes mellitus age 54   Diverticulitis    Diverticulosis of colon without hemorrhage 05/17/2015   By CT scan    DM type 2, controlled, with complication ([name]) 04/29/2015   Essential hypertension 04/29/2015   GERD (gastroesophageal reflux disease)    H/O hiatal hernia    History of echocardiogram    a. Echo 9/16: GLS -15.2%, EF 55-60%, grade 1 diastolic dysfunction, normal wall motion, aortic  sclerosis, dilated aortic root 39 mm, atrial septal lipomatous hypertrophy   Hyperlipidemia    Hypertension    Irritable bowel syndrome (IBS)    Myocardial infarction ([name])    silent inferior MI; patient denies MI history (03/17/13)    Neuralgia    Neuropathy 2013   OSA on CPAP 05/30/2015   Osteoporosis 2013   PAF (paroxysmal atrial fibrillation) ([name])    post CABG 2016; Amiodarone  stopped 2/2 wheezing; PAF 01/12/20   Peripheral vascular disease ([name])    Plantar fasciitis 12/15/2011   Pneumonia    Reflux esophagitis    Right bundle branch block 04/30/2015   Sleep apnea    uses CPAP   Stenosis of right internal carotid artery with cerebral infarction ([name]) 12/09/2011   Stress fracture of metatarsal bone 02/18/2012   Stroke ([name]) 1987   Right brain stroke- slight drop on left side   Syncope 01/12/2020   Type 2 diabetes mellitus with vascular disease ([name]) 05/30/2015   Wears glasses    Past Surgical History:  Procedure Laterality Date   ABDOMINAL AORTOGRAM W/LOWER EXTREMITY Bilateral 11/27/2020   Procedure: ABDOMINAL AORTOGRAM W/LOWER EXTREMITY;  Surgeon: Margherita Shell, MD;  Location: [facility] INVASIVE CV LAB;  Service: Cardiovascular;  Laterality: Bilateral;   ANTERIOR CERVICAL DECOMP/DISCECTOMY FUSION N/A 08/25/2023   Procedure: CERVICAL THREE-FOUR, CERVICAL FOUR-FIVE, CERVICAL FIVE-SIX  ANTERIOR CERVICAL DECOMPRESSION/DISCECTOMY FUSION;  Surgeon: Van Gelinas, MD;  Location: [facility] OR;  Service: Neurosurgery;  Laterality: N/A;   CARDIAC CATHETERIZATION N/A 05/17/2015   Procedure: Left Heart Cath and Coronary Angiography;  Surgeon: Arty Binning, MD;  Location: [facility] INVASIVE CV LAB;  Service: Cardiovascular;  Laterality: N/A;   CAROTID ENDARTERECTOMY  1994 & redo 2001   Left   CATARACT EXTRACTION W/ INTRAOCULAR LENS IMPLANT Bilateral    COLONOSCOPY W/ BIOPSIES AND POLYPECTOMY     CORONARY ARTERY BYPASS GRAFT N/A 05/23/2015   Procedure: CORONARY ARTERY BYPASS GRAFTING (CABG) x 3 (LIMA  to LAD, SVG to DIAGONAL 2, SVG to PDA) with Endoscopic Vein Harvesting from right greater saphenous vein;  Surgeon: Norita Beauvais, MD;  Location: [facility] OR;  Service: Open Heart Surgery;  Laterality: N/A;   ENDARTERECTOMY FEMORAL Right 11/30/2020   Procedure: ENDARTERECTOMY FEMORAL RIGHT;  Surgeon: Mayo Speck, MD;  Location: [facility] OR;  Service: Vascular;  Laterality: Right;   FEMORAL-POPLITEAL BYPASS GRAFT Right 11/30/2020   Procedure: BYPASS GRAFT FEMORAL-POPLITEAL ARTERY RIGHT;  Surgeon: Mayo Speck, MD;  Location: [facility] OR;  Service: Vascular;  Laterality: Right;   FRACTURE SURGERY  2013   Right   foo  t X's 2   ILIAC ARTERY STENT     LAPAROSCOPIC CHOLECYSTECTOMY  09/11/2016   POSTERIOR LUMBAR FUSION  03/24/2013   Level 1   PR VEIN BYPASS GRAFT,AORTO-FEM-POP  1998   SPINE SURGERY  2014   TEE WITHOUT CARDIOVERSION N/A 05/23/2015   Procedure: TRANSESOPHAGEAL ECHOCARDIOGRAM (TEE);  Surgeon: Norita Beauvais, MD;  Location: [facility] OR;  Service: Open Heart Surgery;  Laterality: N/A;   TONSILLECTOMY     TRANSCAROTID ARTERY REVASCULARIZATION  Right 06/01/2020   Procedure: RIGHT TRANSCAROTID ARTERY REVASCULARIZATION;  Surgeon: Margherita Shell, MD;  Location: [facility] OR;  Service: Vascular;  Laterality: Right;   TRIGGER FINGER RELEASE Right 07/14/2017   Procedure: RIGHT LONG FINGER TRIGGER RELEASE;  Surgeon: Rober Chimera, MD;  Location: [facility];  Service: Orthopedics;  Laterality: Right;   Patient Active Problem List   Diagnosis Date Noted   Cervical myelopathy ([name]) 08/25/2023   Diabetic neuropathy ([name]) 06/03/2022   PAD (peripheral artery disease) ([name]) 11/30/2020   Carotid stenosis 06/01/2020   Carotid stenosis, asymptomatic 06/01/2020   Syncope 01/12/2020   Coagulation disorder ([name]) 05/25/2019   Coronary artery disease involving coronary bypass graft of native heart with angina pectoris ([name])    PAF (paroxysmal atrial fibrillation) ([name])    Type 2 diabetes mellitus with  vascular disease ([name]) 05/30/2015   OSA on CPAP 05/30/2015   Abnormal stress test 05/18/2015   Diverticulosis of colon without hemorrhage 05/17/2015   Right bundle branch block 04/30/2015   Essential hypertension 04/29/2015   Hyperlipidemia 04/29/2015   DM type 2, controlled, with complication ([name]) 04/29/2015   Peripheral vascular disease ([name]) 06/08/2012   Stress fracture of metatarsal bone 02/18/2012   Acquired equinus deformity of foot 12/15/2011   Plantar fasciitis 12/15/2011   Stenosis of right internal carotid artery with cerebral infarction ([name]) 12/09/2011    PCP: Roxanna Coppersmith MD  REFERRING PROVIDER: Van Gelinas, MD  REFERRING DIAG: [phone] (ICD-10-CM) - Other spondylosis with myelopathy, cervical region   THERAPY DIAG:  Cervicalgia  Other abnormalities of gait and mobility  Rationale for Evaluation and Treatment: Rehabilitation  ONSET DATE: DOS: 1/14 fusion of c3-c6  SUBJECTIVE:  SUBJECTIVE STATEMENT: The patient reports for 2 days he had no pain at all. He reports after that he had significant pain for about 3 days. Today its painful but not as bad as it was over the weekend.    Eval: Patient has long history of cervical pain.  He is found to have cervical spinal cord compression.  He has cervical fixation from C3-C6 08/25/2023.  He has increased pain since that point.  He reports pain with starts in his neck and goes down into both shoulders.  He also reports weakness in bilateral upper extremities.    Hand dominance: Right  PERTINENT HISTORY:  Anemia, acquired equinus deformity; CAD< CKD, DMII, IBS, MI, MI, neuralgia, PF, carotid stenosis Stroke, Torn Rotator cuff; CABG; vein bypass graft 1998; Spine surgery in 2014     PAIN:  PAIN:  Are you having pain?  Yes: NPRS scale: 5/10 Pain location: cervical spine into bilateral shoulders  Pain description: aching Aggravating factors: UE activity  Relieving factors:  rest  PRECAUTIONS: None  RED FLAGS: None     WEIGHT BEARING RESTRICTIONS: No  FALLS:  Has patient fallen in last 6 months? No    OCCUPATION:  Lives   PLOF: Independent  PATIENT GOALS: get pain down and get stronger  NEXT MD VISIT: 12/10/2023  OBJECTIVE:  Note: Objective measures were completed at Evaluation unless otherwise noted.  DIAGNOSTIC FINDINGS:  Surgery: CERVICAL THREE-FOUR, CERVICAL FOUR-FIVE, CERVICAL FIVE-SIX ANTERIOR CERVICAL DECOMPRESSION/DISCECTOMY FUSION   PATIENT SURVEYS:  NDI    COGNITION: Overall cognitive status: Within functional limits for tasks assessed  SENSATION: WFL  POSTURE: rounded shoulders, forward head, and flexed trunk   PALPATION: Spasming in cervical spine and into peri-scapular area  CERVICAL ROM:   Active ROM A/PROM (deg) eval  Flexion 25  Extension 20  Right lateral flexion 2  Left lateral flexion 2  Right rotation 42  Left rotation 40   (Blank rows = not tested)  UPPER EXTREMITY ROM:  Active ROM Right eval Left eval  Shoulder flexion 135 120  Shoulder extension    Shoulder abduction 155 145  Shoulder adduction    Shoulder extension    Shoulder internal rotation T12 T12  Shoulder external rotation T1 Base of occiput  Elbow flexion    Elbow extension    Wrist flexion    Wrist extension    Wrist ulnar deviation    Wrist radial deviation    Wrist pronation    Wrist supination     (Blank rows = not tested)  UPPER EXTREMITY MMT:  MMT Right eval Left eval  Shoulder flexion 4 4-  Shoulder extension    Shoulder abduction    Shoulder adduction    Shoulder extension    Shoulder internal rotation    Shoulder external rotation    Middle trapezius    Lower trapezius    Elbow flexion    Elbow extension    Wrist flexion    Wrist extension     Wrist ulnar deviation    Wrist radial deviation    Wrist pronation    Wrist supination    Grip strength     (Blank rows = not tested)  Grip test:  R: 50lbs  L: 20lbs  CERVICAL SPECIAL TESTS:    FUNCTIONAL TESTS:  Feet together eyes closed: able to complete, moderate swaying Tandem eyes closed: able to complete moderate swaying  Treatment Date: 6/11  Manual:  Trigger point release to upper traps, cervical paraspinals, levator. Suboccipital release  Neuro-re-ed  Punches 3x12 2lbs  Bicep curls 3x12 2lbs   With cuing for posture   Row 15 lbs 2x12  Shoulder extension 2x15 10 lbs       PATIENT EDUCATION:  Education details: Symptom management, HEP, self care Person educated: Patient Education method: Explanation, Demonstration, Tactile cues, Verbal cues, and Handouts Education comprehension: verbalized understanding and returned demonstration  HOME EXERCISE PROGRAM:   ASSESSMENT:  CLINICAL IMPRESSION: The patient has less muscle tightness in his neck today. He still had sore areas on the left and right. We continue to work on postural correction and general UE strengthening. He needs rest breaks with LE 2nd to claudication. We will continue to progress as tolerated.   Patient is a 78 year old male status post C3-C6 cervical fixation on 08/24/2022.  At this point he presents with limited upper extremity strength, limited cervical mobility, and pain with functional activities.  He has decreased grip strength on the left compared to the right.  He would benefit from skilled therapy to improve ability to use arms for functional activity.  OBJECTIVE IMPAIRMENTS: Abnormal gait, decreased activity tolerance, decreased balance, decreased endurance, difficulty walking, decreased ROM, decreased strength, increased fascial restrictions, increased muscle spasms, postural dysfunction, and pain.   ACTIVITY LIMITATIONS: carrying, lifting, sitting, standing, squatting, transfers,  dressing, self feeding, reach over head, and hygiene/grooming  PARTICIPATION LIMITATIONS: meal prep, cleaning, laundry, driving, shopping, community activity, and yard work  PERSONAL FACTORS: 3+ comorbidities: multi joint OA, low back pain, right leg stent failure  are also affecting patient's functional outcome.   REHAB POTENTIAL: Good  CLINICAL DECISION MAKING: Evolving/moderate complexity  EVALUATION COMPLEXITY: Moderate   GOALS: Goals reviewed with patient? Yes  SHORT TERM GOALS: Target date: 12/29/2023    Patient will increase cervical rotation by 10 degrees  Baseline:  Goal status: no change at this time 6/5   2.  Patient will demonstrate full flexion of bilateral shoulders  Baseline:  Goal status: INITIAL  3.  Patient will increase grip strength by 10 lbs  Baseline:  Goal status: INITIAL L  LONG TERM GOALS: Target date: 01/26/2024    Patient will perform ADL's without neck pain  Baseline:  Goal status: INITIAL  2.  Patient will demonstrate 50 degrees of cervical rotation in order to drive  Baseline:  Goal status: INITIAL  3.  Patient will demonstrate good functional balance in order to improve safety  Baseline:  Goal status: INITIAL     PLAN:  PT FREQUENCY: 1-2x/week  PT DURATION: 8 weeks  PLANNED INTERVENTIONS:   PLAN FOR NEXT SESSION: Consider trigger point release to bilateral upper trap, cervical paraspinals, and parascapular muscles.  Continue with postural exercises.   Kitty Perkins, PT 01/21/2024, 7:51 AM

## 2024-01-21 ENCOUNTER — Encounter (HOSPITAL_BASED_OUTPATIENT_CLINIC_OR_DEPARTMENT_OTHER): Payer: Self-pay | Admitting: Physical Therapy

## 2024-01-21 DIAGNOSIS — L905 Scar conditions and fibrosis of skin: Secondary | ICD-10-CM | POA: Diagnosis not present

## 2024-01-21 DIAGNOSIS — D0439 Carcinoma in situ of skin of other parts of face: Secondary | ICD-10-CM | POA: Diagnosis not present

## 2024-01-21 DIAGNOSIS — C44622 Squamous cell carcinoma of skin of right upper limb, including shoulder: Secondary | ICD-10-CM | POA: Diagnosis not present

## 2024-01-27 ENCOUNTER — Ambulatory Visit (HOSPITAL_BASED_OUTPATIENT_CLINIC_OR_DEPARTMENT_OTHER): Admitting: Physical Therapy

## 2024-01-27 ENCOUNTER — Encounter (HOSPITAL_BASED_OUTPATIENT_CLINIC_OR_DEPARTMENT_OTHER): Payer: Self-pay | Admitting: Physical Therapy

## 2024-01-27 DIAGNOSIS — R2689 Other abnormalities of gait and mobility: Secondary | ICD-10-CM

## 2024-01-27 DIAGNOSIS — M542 Cervicalgia: Secondary | ICD-10-CM | POA: Diagnosis not present

## 2024-01-27 NOTE — Therapy (Signed)
 OUTPATIENT PHYSICAL THERAPY CERVICAL EVALUATION   Patient Name: Bradley [name] MRN: [id number] DOB:Apr 29, 1946, 78 y.o., male Today's Date: 01/27/2024  END OF SESSION:     Past Medical History:  Diagnosis Date   Acquired equinus deformity of foot 12/15/2011   Anemia    Arthritis    Carotid artery occlusion    a. Carotid US  10/16: RICA 60-79%, L CEA patent with 1-39% stenosis   CKD (chronic kidney disease)    stage 3-4   Coagulation disorder ([name]) 05/25/2019   Coronary artery disease    a. Myoview  9/16:  EF 52%, inferior fixed defect consistent with diaphragmatic attenuation, intermediate risk secondary to poor exercise tolerance and symptoms during stress;  b. LHC 05/17/2015 90% mid LAD, 80% ost D2, 75% mid RCA, 35% prox RCA. >> S/p CABG   Coronary artery disease involving coronary bypass graft of native heart with angina pectoris ([name])    CORONARY ARTERY BYPASS GRAFTING x 3 - 05/23/2015 Left internal mammary artery to left anterior descending  Saphenous vein graft to diagonal  Saphenous vein graft to posterior descending Endoscopic greater saphenous vein harvest right thigh     Diabetes mellitus age 51   Diverticulitis    Diverticulosis of colon without hemorrhage 05/17/2015   By CT scan    DM type 2, controlled, with complication ([name]) 04/29/2015   Essential hypertension 04/29/2015   GERD (gastroesophageal reflux disease)    H/O hiatal hernia    History of echocardiogram    a. Echo 9/16: GLS -15.2%, EF 55-60%, grade 1 diastolic dysfunction, normal wall motion, aortic sclerosis, dilated aortic root 39 mm, atrial septal lipomatous hypertrophy   Hyperlipidemia    Hypertension    Irritable bowel syndrome (IBS)    Myocardial infarction ([name])    silent inferior MI; patient denies MI history (03/17/13)    Neuralgia    Neuropathy 2013   OSA on CPAP 05/30/2015   Osteoporosis 2013   PAF (paroxysmal atrial fibrillation) ([name])    post CABG 2016; Amiodarone  stopped 2/2 wheezing; PAF  01/12/20   Peripheral vascular disease ([name])    Plantar fasciitis 12/15/2011   Pneumonia    Reflux esophagitis    Right bundle branch block 04/30/2015   Sleep apnea    uses CPAP   Stenosis of right internal carotid artery with cerebral infarction ([name]) 12/09/2011   Stress fracture of metatarsal bone 02/18/2012   Stroke ([name]) 1987   Right brain stroke- slight drop on left side   Syncope 01/12/2020   Type 2 diabetes mellitus with vascular disease ([name]) 05/30/2015   Wears glasses    Past Surgical History:  Procedure Laterality Date   ABDOMINAL AORTOGRAM W/LOWER EXTREMITY Bilateral 11/27/2020   Procedure: ABDOMINAL AORTOGRAM W/LOWER EXTREMITY;  Surgeon: Margherita Shell, MD;  Location: [facility];  Service: Cardiovascular;  Laterality: Bilateral;   ANTERIOR CERVICAL DECOMP/DISCECTOMY FUSION N/A 08/25/2023   Procedure: CERVICAL THREE-FOUR, CERVICAL FOUR-FIVE, CERVICAL FIVE-SIX ANTERIOR CERVICAL DECOMPRESSION/DISCECTOMY FUSION;  Surgeon: Van Gelinas, MD;  Location: [facility] OR;  Service: Neurosurgery;  Laterality: N/A;   CARDIAC CATHETERIZATION N/A 05/17/2015   Procedure: Left Heart Cath and Coronary Angiography;  Surgeon: Arty Binning, MD;  Location: [facility]VAS[facility];  Service: Cardiovascular;  Laterality: N/A;   CAROTID ENDARTERECTOMY  1994 & redo 2001   Left   CATARACT EXTRACTION W/ INTRAOCULAR LENS IMPLANT Bilateral    COLONOSCOPY W/ BIOPSIES AND POLYPECTOMY     CORONARY ARTERY BYPASS GRAFT N/A 05/23/2015   Procedure: CORONARY ARTERY  BYPASS GRAFTING (CABG) x 3 (LIMA to LAD, SVG to DIAGONAL 2, SVG to PDA) with Endoscopic Vein Harvesting from right greater saphenous vein;  Surgeon: Norita Beauvais, MD;  Location: [facility] OR;  Service: Open Heart Surgery;  Laterality: N/A;   ENDARTERECTOMY FEMORAL Right 11/30/2020   Procedure: ENDARTERECTOMY FEMORAL RIGHT;  Surgeon: Mayo Speck, MD;  Location: [facility] OR;  Service: Vascular;  Laterality: Right;   FEMORAL-POPLITEAL BYPASS GRAFT Right  11/30/2020   Procedure: BYPASS GRAFT FEMORAL-POPLITEAL ARTERY RIGHT;  Surgeon: Mayo Speck, MD;  Location: [facility] OR;  Service: Vascular;  Laterality: Right;   FRACTURE SURGERY  2013   Right   foo  t X's 2   ILIAC ARTERY STENT     LAPAROSCOPIC CHOLECYSTECTOMY  09/11/2016   POSTERIOR LUMBAR FUSION  03/24/2013   Level 1   PR VEIN BYPASS GRAFT,AORTO-FEM-POP  1998   SPINE SURGERY  2014   TEE WITHOUT CARDIOVERSION N/A 05/23/2015   Procedure: TRANSESOPHAGEAL ECHOCARDIOGRAM (TEE);  Surgeon: Norita Beauvais, MD;  Location: [facility] OR;  Service: Open Heart Surgery;  Laterality: N/A;   TONSILLECTOMY     TRANSCAROTID ARTERY REVASCULARIZATION  Right 06/01/2020   Procedure: RIGHT TRANSCAROTID ARTERY REVASCULARIZATION;  Surgeon: Margherita Shell, MD;  Location: [facility] OR;  Service: Vascular;  Laterality: Right;   TRIGGER FINGER RELEASE Right 07/14/2017   Procedure: RIGHT LONG FINGER TRIGGER RELEASE;  Surgeon: Rober Chimera, MD;  Location: [facility];  Service: Orthopedics;  Laterality: Right;   Patient Active Problem List   Diagnosis Date Noted   Cervical myelopathy ([name]) 08/25/2023   Diabetic neuropathy ([name]) 06/03/2022   PAD (peripheral artery disease) ([name]) 11/30/2020   Carotid stenosis 06/01/2020   Carotid stenosis, asymptomatic 06/01/2020   Syncope 01/12/2020   Coagulation disorder ([name]) 05/25/2019   Coronary artery disease involving coronary bypass graft of native heart with angina pectoris ([name])    PAF (paroxysmal atrial fibrillation) ([name])    Type 2 diabetes mellitus with vascular disease ([name]) 05/30/2015   OSA on CPAP 05/30/2015   Abnormal stress test 05/18/2015   Diverticulosis of colon without hemorrhage 05/17/2015   Right bundle branch block 04/30/2015   Essential hypertension 04/29/2015   Hyperlipidemia 04/29/2015   DM type 2, controlled, with complication ([name]) 04/29/2015   Peripheral vascular disease ([name]) 06/08/2012   Stress fracture of metatarsal bone 02/18/2012    Acquired equinus deformity of foot 12/15/2011   Plantar fasciitis 12/15/2011   Stenosis of right internal carotid artery with cerebral infarction ([name]) 12/09/2011    PCP: Roxanna Coppersmith MD  REFERRING PROVIDER: Van Gelinas, MD  REFERRING DIAG: [phone] (ICD-10-CM) - Other spondylosis with myelopathy, cervical region   THERAPY DIAG:  No diagnosis found.  Rationale for Evaluation and Treatment: Rehabilitation  ONSET DATE: DOS: 1/14 fusion of c3-c6  SUBJECTIVE:  SUBJECTIVE STATEMENT: The patient reports for 2 days he had no pain at all. He reports after that he had significant pain for about 3 days. Today its painful but not as bad as it was over the weekend.    Eval: Patient has long history of cervical pain.  He is found to have cervical spinal cord compression.  He has cervical fixation from C3-C6 08/25/2023.  He has increased pain since that point.  He reports pain with starts in his neck and goes down into both shoulders.  He also reports weakness in bilateral upper extremities.    Hand dominance: Right  PERTINENT HISTORY:  Anemia, acquired equinus deformity; CAD< CKD, DMII, IBS, MI, MI, neuralgia, PF, carotid stenosis Stroke, Torn Rotator cuff; CABG; vein bypass graft 1998; Spine surgery in 2014     PAIN:  PAIN:  Are you having pain? Yes: NPRS scale: 5/10 Pain location: cervical spine into bilateral shoulders  Pain description: aching Aggravating factors: UE activity  Relieving factors:  rest  PRECAUTIONS: None  RED FLAGS: None     WEIGHT BEARING RESTRICTIONS: No  FALLS:  Has patient fallen in last 6 months? No    OCCUPATION:  Lives   PLOF: Independent  PATIENT GOALS: get pain down and get stronger  NEXT MD VISIT: 12/10/2023  OBJECTIVE:  Note: Objective  measures were completed at Evaluation unless otherwise noted.  DIAGNOSTIC FINDINGS:  Surgery: CERVICAL THREE-FOUR, CERVICAL FOUR-FIVE, CERVICAL FIVE-SIX ANTERIOR CERVICAL DECOMPRESSION/DISCECTOMY FUSION   PATIENT SURVEYS:  NDI    COGNITION: Overall cognitive status: Within functional limits for tasks assessed  SENSATION: WFL  POSTURE: rounded shoulders, forward head, and flexed trunk   PALPATION: Spasming in cervical spine and into peri-scapular area  CERVICAL ROM:   Active ROM A/PROM (deg) eval AROM 6/18   Flexion 25   Extension 20   Right lateral flexion 2   Left lateral flexion 2   Right rotation 42 54  Left rotation 40 63   (Blank rows = not tested)  UPPER EXTREMITY ROM:  Active ROM Right eval Left eval  Shoulder flexion 135 120  Shoulder extension    Shoulder abduction 155 145  Shoulder adduction    Shoulder extension    Shoulder internal rotation T12 T12  Shoulder external rotation T1 Base of occiput  Elbow flexion    Elbow extension    Wrist flexion    Wrist extension    Wrist ulnar deviation    Wrist radial deviation    Wrist pronation    Wrist supination     (Blank rows = not tested)  UPPER EXTREMITY MMT:  MMT Right eval Left eval  Shoulder flexion 4 4-  Shoulder extension    Shoulder abduction    Shoulder adduction    Shoulder extension    Shoulder internal rotation    Shoulder external rotation    Middle trapezius    Lower trapezius    Elbow flexion    Elbow extension    Wrist flexion    Wrist extension    Wrist ulnar deviation    Wrist radial deviation    Wrist pronation    Wrist supination    Grip strength     (Blank rows = not tested)  Grip test:  R: 50lbs  L: 20lbs  CERVICAL SPECIAL TESTS:    FUNCTIONAL TESTS:  Feet together eyes closed: able to complete, moderate swaying Tandem eyes closed: able to complete moderate swaying  Treatment Date: 6/18 Manual:  Trigger point release to  upper traps, cervical  paraspinals, levator. Suboccipital release  Neuro-re-ed  Punches 3x12 3lbs  Bicep curls 3x12 3lbs  Bilateral ER red 3x12   There-ex: anterior chest wand stretch   6/11  Manual:  Trigger point release to upper traps, cervical paraspinals, levator. Suboccipital release  Neuro-re-ed  Punches 3x12 2lbs  Bicep curls 3x12 2lbs   With cuing for posture   Row 15 lbs 2x12  Shoulder extension 2x15 10 lbs       PATIENT EDUCATION:  Education details: Symptom management, HEP, self care Person educated: Patient Education method: Explanation, Demonstration, Tactile cues, Verbal cues, and Handouts Education comprehension: verbalized understanding and returned demonstration  HOME EXERCISE PROGRAM:   ASSESSMENT:  CLINICAL IMPRESSION: The patient tolerated treatment well. The tightness is improving. See above objective measures above. We advanced the weights with all of his exercises. Therapy will continue to progress as tolerated. He was advised not to push his rotation to end range at this time. His gait and legs continue to be his biggest issue at this time. He will not see his vascular MD for about 5 months. He was advised to use an assistive device if needed.    Eval:  Patient is a 78 year old male status post C3-C6 cervical fixation on 08/24/2022.  At this point he presents with limited upper extremity strength, limited cervical mobility, and pain with functional activities.  He has decreased grip strength on the left compared to the right.  He would benefit from skilled therapy to improve ability to use arms for functional activity.  OBJECTIVE IMPAIRMENTS: Abnormal gait, decreased activity tolerance, decreased balance, decreased endurance, difficulty walking, decreased ROM, decreased strength, increased fascial restrictions, increased muscle spasms, postural dysfunction, and pain.   ACTIVITY LIMITATIONS: carrying, lifting, sitting, standing, squatting, transfers, dressing, self  feeding, reach over head, and hygiene/grooming  PARTICIPATION LIMITATIONS: meal prep, cleaning, laundry, driving, shopping, community activity, and yard work  PERSONAL FACTORS: 3+ comorbidities: multi joint OA, low back pain, right leg stent failure  are also affecting patient's functional outcome.   REHAB POTENTIAL: Good  CLINICAL DECISION MAKING: Evolving/moderate complexity  EVALUATION COMPLEXITY: Moderate   GOALS: Goals reviewed with patient? Yes  SHORT TERM GOALS: Target date: 12/29/2023    Patient will increase cervical rotation by 10 degrees  Baseline:  Goal status: no change at this time 6/5   2.  Patient will demonstrate full flexion of bilateral shoulders  Baseline:  Goal status: still limited 6/18   3.  Patient will increase grip strength by 10 lbs  Baseline:  Goal status: INITIAL L  LONG TERM GOALS: Target date: 01/26/2024    Patient will perform ADL's without neck pain  Baseline:  Goal status: INITIAL  2.  Patient will demonstrate 50 degrees of cervical rotation in order to drive  Baseline:  Goal status: INITIAL  3.  Patient will demonstrate good functional balance in order to improve safety  Baseline:  Goal status: INITIAL     PLAN:  PT FREQUENCY: 1-2x/week  PT DURATION: 8 weeks  PLANNED INTERVENTIONS:   PLAN FOR NEXT SESSION: Consider trigger point release to bilateral upper trap, cervical paraspinals, and parascapular muscles.  Continue with postural exercises.   Kitty Perkins, PT 01/27/2024, 1:06 PM

## 2024-02-03 ENCOUNTER — Encounter (HOSPITAL_BASED_OUTPATIENT_CLINIC_OR_DEPARTMENT_OTHER): Payer: Self-pay | Admitting: Physical Therapy

## 2024-02-03 ENCOUNTER — Ambulatory Visit (HOSPITAL_BASED_OUTPATIENT_CLINIC_OR_DEPARTMENT_OTHER): Admitting: Physical Therapy

## 2024-02-03 DIAGNOSIS — M542 Cervicalgia: Secondary | ICD-10-CM

## 2024-02-03 DIAGNOSIS — R2689 Other abnormalities of gait and mobility: Secondary | ICD-10-CM

## 2024-02-03 NOTE — Therapy (Unsigned)
 OUTPATIENT PHYSICAL THERAPY CERVICAL EVALUATION   Patient Name: Bradley [name] MRN: [id number] DOB:[birth year], 78 y.o., male Today's Date: 02/03/2024  END OF SESSION:  PT End of Session - 02/03/24 1305     Visit Number 5    Number of Visits 16    Date for PT Re-Evaluation 01/27/24    Authorization Type progress note done on visit 12 next to be done on 22    PT Start Time 1300    PT Stop Time 1343    PT Time Calculation (min) 43 min    Activity Tolerance Patient tolerated treatment well;No increased pain    Behavior During Therapy [facility] for tasks assessed/performed            Past Medical History:  Diagnosis Date   Acquired equinus deformity of foot 12/15/2011   Anemia    Arthritis    Carotid artery occlusion    a. Carotid US  10/16: RICA 60-79%, L CEA patent with 1-39% stenosis   CKD (chronic kidney disease)    stage 3-4   Coagulation disorder ([name]) 05/25/2019   Coronary artery disease    a. Myoview  9/16:  EF 52%, inferior fixed defect consistent with diaphragmatic attenuation, intermediate risk secondary to poor exercise tolerance and symptoms during stress;  b. LHC 05/17/2015 90% mid LAD, 80% ost D2, 75% mid RCA, 35% prox RCA. >> S/p CABG   Coronary artery disease involving coronary bypass graft of native heart with angina pectoris ([name])    CORONARY ARTERY BYPASS GRAFTING x 3 - 05/23/2015 Left internal mammary artery to left anterior descending  Saphenous vein graft to diagonal  Saphenous vein graft to posterior descending Endoscopic greater saphenous vein harvest right thigh     Diabetes mellitus age 81   Diverticulitis    Diverticulosis of colon without hemorrhage 05/17/2015   By CT scan    DM type 2, controlled, with complication ([name]) 04/29/2015   Essential hypertension 04/29/2015   GERD (gastroesophageal reflux disease)    H/O hiatal hernia    History of echocardiogram    a. Echo 9/16: GLS -15.2%, EF 55-60%, grade 1 diastolic dysfunction, normal wall motion,  aortic sclerosis, dilated aortic root 39 mm, atrial septal lipomatous hypertrophy   Hyperlipidemia    Hypertension    Irritable bowel syndrome (IBS)    Myocardial infarction ([name])    silent inferior MI; patient denies MI history (03/17/13)    Neuralgia    Neuropathy 2013   OSA on CPAP 05/30/2015   Osteoporosis 2013   PAF (paroxysmal atrial fibrillation) ([name])    post CABG 2016; Amiodarone  stopped 2/2 wheezing; PAF 01/12/20   Peripheral vascular disease ([name])    Plantar fasciitis 12/15/2011   Pneumonia    Reflux esophagitis    Right bundle branch block 04/30/2015   Sleep apnea    uses CPAP   Stenosis of right internal carotid artery with cerebral infarction ([name]) 12/09/2011   Stress fracture of metatarsal bone 02/18/2012   Stroke ([name]) 1987   Right brain stroke- slight drop on left side   Syncope 01/12/2020   Type 2 diabetes mellitus with vascular disease ([name]) 05/30/2015   Wears glasses    Past Surgical History:  Procedure Laterality Date   ABDOMINAL AORTOGRAM W/LOWER EXTREMITY Bilateral 11/27/2020   Procedure: ABDOMINAL AORTOGRAM W/LOWER EXTREMITY;  Surgeon: Serene Gaile ORN, MD;  Location: [facility] INVASIVE CV LAB;  Service: Cardiovascular;  Laterality: Bilateral;   ANTERIOR CERVICAL DECOMP/DISCECTOMY FUSION N/A 08/25/2023   Procedure: CERVICAL THREE-FOUR, CERVICAL FOUR-FIVE, CERVICAL  FIVE-SIX ANTERIOR CERVICAL DECOMPRESSION/DISCECTOMY FUSION;  Surgeon: Debby Dorn MATSU, MD;  Location: [facility] OR;  Service: Neurosurgery;  Laterality: N/A;   CARDIAC CATHETERIZATION N/A 05/17/2015   Procedure: Left Heart Cath and Coronary Angiography;  Surgeon: Victory LELON Sharps, MD;  Location: [facility] INVASIVE CV LAB;  Service: Cardiovascular;  Laterality: N/A;   CAROTID ENDARTERECTOMY  1994 & redo 2001   Left   CATARACT EXTRACTION W/ INTRAOCULAR LENS IMPLANT Bilateral    COLONOSCOPY W/ BIOPSIES AND POLYPECTOMY     CORONARY ARTERY BYPASS GRAFT N/A 05/23/2015   Procedure: CORONARY ARTERY BYPASS GRAFTING (CABG) x 3  (LIMA to LAD, SVG to DIAGONAL 2, SVG to PDA) with Endoscopic Vein Harvesting from right greater saphenous vein;  Surgeon: Dallas KATHEE Jude, MD;  Location: [facility] OR;  Service: Open Heart Surgery;  Laterality: N/A;   ENDARTERECTOMY FEMORAL Right 11/30/2020   Procedure: ENDARTERECTOMY FEMORAL RIGHT;  Surgeon: Oris Krystal FALCON, MD;  Location: [facility] OR;  Service: Vascular;  Laterality: Right;   FEMORAL-POPLITEAL BYPASS GRAFT Right 11/30/2020   Procedure: BYPASS GRAFT FEMORAL-POPLITEAL ARTERY RIGHT;  Surgeon: Oris Krystal FALCON, MD;  Location: [facility] OR;  Service: Vascular;  Laterality: Right;   FRACTURE SURGERY  2013   Right   foo  t X's 2   ILIAC ARTERY STENT     LAPAROSCOPIC CHOLECYSTECTOMY  09/11/2016   POSTERIOR LUMBAR FUSION  03/24/2013   Level 1   PR VEIN BYPASS GRAFT,AORTO-FEM-POP  1998   SPINE SURGERY  2014   TEE WITHOUT CARDIOVERSION N/A 05/23/2015   Procedure: TRANSESOPHAGEAL ECHOCARDIOGRAM (TEE);  Surgeon: Dallas KATHEE Jude, MD;  Location: [facility] OR;  Service: Open Heart Surgery;  Laterality: N/A;   TONSILLECTOMY     TRANSCAROTID ARTERY REVASCULARIZATION  Right 06/01/2020   Procedure: RIGHT TRANSCAROTID ARTERY REVASCULARIZATION;  Surgeon: Serene Gaile LELON, MD;  Location: [facility] OR;  Service: Vascular;  Laterality: Right;   TRIGGER FINGER RELEASE Right 07/14/2017   Procedure: RIGHT LONG FINGER TRIGGER RELEASE;  Surgeon: Sebastian Lenis, MD;  Location: [facility];  Service: Orthopedics;  Laterality: Right;   Patient Active Problem List   Diagnosis Date Noted   Cervical myelopathy ([name]) 08/25/2023   Diabetic neuropathy ([name]) 06/03/2022   PAD (peripheral artery disease) ([name]) 11/30/2020   Carotid stenosis 06/01/2020   Carotid stenosis, asymptomatic 06/01/2020   Syncope 01/12/2020   Coagulation disorder ([name]) 05/25/2019   Coronary artery disease involving coronary bypass graft of native heart with angina pectoris ([name])    PAF (paroxysmal atrial fibrillation) ([name])    Type 2 diabetes mellitus  with vascular disease ([name]) 05/30/2015   OSA on CPAP 05/30/2015   Abnormal stress test 05/18/2015   Diverticulosis of colon without hemorrhage 05/17/2015   Right bundle branch block 04/30/2015   Essential hypertension 04/29/2015   Hyperlipidemia 04/29/2015   DM type 2, controlled, with complication ([name]) 04/29/2015   Peripheral vascular disease ([name]) 06/08/2012   Stress fracture of metatarsal bone 02/18/2012   Acquired equinus deformity of foot 12/15/2011   Plantar fasciitis 12/15/2011   Stenosis of right internal carotid artery with cerebral infarction ([name]) 12/09/2011    PCP: Carlin Gull MD  REFERRING PROVIDER: Debby Dorn MATSU, MD  REFERRING DIAG: [phone] (ICD-10-CM) - Other spondylosis with myelopathy, cervical region   THERAPY DIAG:  Cervicalgia  Other abnormalities of gait and mobility  Rationale for Evaluation and Treatment: Rehabilitation  ONSET DATE: DOS: 1/14 fusion of c3-c6  SUBJECTIVE:  SUBJECTIVE STATEMENT: The patient reports for 2 days he had no pain at all. He reports after that he had significant pain for about 3 days. Today its painful but not as bad as it was over the weekend.    Eval: Patient has long history of cervical pain.  He is found to have cervical spinal cord compression.  He has cervical fixation from C3-C6 08/25/2023.  He has increased pain since that point.  He reports pain with starts in his neck and goes down into both shoulders.  He also reports weakness in bilateral upper extremities.    Hand dominance: Right  PERTINENT HISTORY:  Anemia, acquired equinus deformity; CAD< CKD, DMII, IBS, MI, MI, neuralgia, PF, carotid stenosis Stroke, Torn Rotator cuff; CABG; vein bypass graft 1998; Spine surgery in 2014     PAIN:  PAIN:  Are you having  pain? Yes: NPRS scale: 5/10 Pain location: cervical spine into bilateral shoulders  Pain description: aching Aggravating factors: UE activity  Relieving factors:  rest  PRECAUTIONS: None  RED FLAGS: None     WEIGHT BEARING RESTRICTIONS: No  FALLS:  Has patient fallen in last 6 months? No    OCCUPATION:  Lives   PLOF: Independent  PATIENT GOALS: get pain down and get stronger  NEXT MD VISIT: 12/10/2023  OBJECTIVE:  Note: Objective measures were completed at Evaluation unless otherwise noted.  DIAGNOSTIC FINDINGS:  Surgery: CERVICAL THREE-FOUR, CERVICAL FOUR-FIVE, CERVICAL FIVE-SIX ANTERIOR CERVICAL DECOMPRESSION/DISCECTOMY FUSION   PATIENT SURVEYS:  NDI    COGNITION: Overall cognitive status: Within functional limits for tasks assessed  SENSATION: WFL  POSTURE: rounded shoulders, forward head, and flexed trunk   PALPATION: Spasming in cervical spine and into peri-scapular area  CERVICAL ROM:   Active ROM A/PROM (deg) eval AROM 6/18   Flexion 25   Extension 20   Right lateral flexion 2   Left lateral flexion 2   Right rotation 42 54  Left rotation 40 63   (Blank rows = not tested)  UPPER EXTREMITY ROM:  Active ROM Right eval Left eval  Shoulder flexion 135 120  Shoulder extension    Shoulder abduction 155 145  Shoulder adduction    Shoulder extension    Shoulder internal rotation T12 T12  Shoulder external rotation T1 Base of occiput  Elbow flexion    Elbow extension    Wrist flexion    Wrist extension    Wrist ulnar deviation    Wrist radial deviation    Wrist pronation    Wrist supination     (Blank rows = not tested)  UPPER EXTREMITY MMT:  MMT Right eval Left eval  Shoulder flexion 4 4-  Shoulder extension    Shoulder abduction    Shoulder adduction    Shoulder extension    Shoulder internal rotation    Shoulder external rotation    Middle trapezius    Lower trapezius    Elbow flexion    Elbow extension    Wrist flexion     Wrist extension    Wrist ulnar deviation    Wrist radial deviation    Wrist pronation    Wrist supination    Grip strength     (Blank rows = not tested)  Grip test:  R: 50lbs  L: 20lbs  CERVICAL SPECIAL TESTS:    FUNCTIONAL TESTS:  Feet together eyes closed: able to complete, moderate swaying Tandem eyes closed: able to complete moderate swaying  Treatment Date: 6/25 Trigger point release to upper  traps, cervical paraspinals, levator. Suboccipital release  There-ex:  Seated Hip abduction 20 green  Seated March x20 Green  Seated LAQ  yellow 2x10 each leg   6/18 Manual:  Trigger point release to upper traps, cervical paraspinals, levator. Suboccipital release  Neuro-re-ed  Punches 3x12 3lbs  Bicep curls 3x12 3lbs  Bilateral ER red 3x12   There-ex: anterior chest wand stretch   6/11  Manual:  Trigger point release to upper traps, cervical paraspinals, levator. Suboccipital release  Neuro-re-ed  Punches 3x12 2lbs  Bicep curls 3x12 2lbs   With cuing for posture   Row 15 lbs 2x12  Shoulder extension 2x15 10 lbs       PATIENT EDUCATION:  Education details: Symptom management, HEP, self care Person educated: Patient Education method: Explanation, Demonstration, Tactile cues, Verbal cues, and Handouts Education comprehension: verbalized understanding and returned demonstration  HOME EXERCISE PROGRAM: [id number]  ASSESSMENT:  CLINICAL IMPRESSION: The patient continues to improve. We reviewed LE exercises that he can do to improve his overall posture. He was advised to watch for pain in his legs. He reports claudication at times. We reviewed and updated his HEP. The spasming in his neck is improving. Therapy will continue to progress as tolerated.   Eval:  Patient is a 78 year old male status post C3-C6 cervical fixation on 08/24/2022.  At this point he presents with limited upper extremity strength, limited cervical mobility, and pain with functional  activities.  He has decreased grip strength on the left compared to the right.  He would benefit from skilled therapy to improve ability to use arms for functional activity.  OBJECTIVE IMPAIRMENTS: Abnormal gait, decreased activity tolerance, decreased balance, decreased endurance, difficulty walking, decreased ROM, decreased strength, increased fascial restrictions, increased muscle spasms, postural dysfunction, and pain.   ACTIVITY LIMITATIONS: carrying, lifting, sitting, standing, squatting, transfers, dressing, self feeding, reach over head, and hygiene/grooming  PARTICIPATION LIMITATIONS: meal prep, cleaning, laundry, driving, shopping, community activity, and yard work  PERSONAL FACTORS: 3+ comorbidities: multi joint OA, low back pain, right leg stent failure  are also affecting patient's functional outcome.   REHAB POTENTIAL: Good  CLINICAL DECISION MAKING: Evolving/moderate complexity  EVALUATION COMPLEXITY: Moderate   GOALS: Goals reviewed with patient? Yes  SHORT TERM GOALS: Target date: 12/29/2023    Patient will increase cervical rotation by 10 degrees  Baseline:  Goal status: no change at this time 6/5   2.  Patient will demonstrate full flexion of bilateral shoulders  Baseline:  Goal status: still limited 6/18   3.  Patient will increase grip strength by 10 lbs  Baseline:  Goal status: INITIAL L  LONG TERM GOALS: Target date: 01/26/2024    Patient will perform ADL's without neck pain  Baseline:  Goal status: INITIAL  2.  Patient will demonstrate 50 degrees of cervical rotation in order to drive  Baseline:  Goal status: INITIAL  3.  Patient will demonstrate good functional balance in order to improve safety  Baseline:  Goal status: INITIAL     PLAN:  PT FREQUENCY: 1-2x/week  PT DURATION: 8 weeks  PLANNED INTERVENTIONS:   PLAN FOR NEXT SESSION: Consider trigger point release to bilateral upper trap, cervical paraspinals, and parascapular  muscles.  Continue with postural exercises.   Bradley [name], PT 02/03/2024, 1:09 PM

## 2024-02-04 ENCOUNTER — Encounter (HOSPITAL_BASED_OUTPATIENT_CLINIC_OR_DEPARTMENT_OTHER): Payer: Self-pay | Admitting: Physical Therapy

## 2024-02-10 DIAGNOSIS — G4733 Obstructive sleep apnea (adult) (pediatric): Secondary | ICD-10-CM | POA: Diagnosis not present

## 2024-02-15 DIAGNOSIS — E1142 Type 2 diabetes mellitus with diabetic polyneuropathy: Secondary | ICD-10-CM | POA: Diagnosis not present

## 2024-02-17 ENCOUNTER — Ambulatory Visit (HOSPITAL_BASED_OUTPATIENT_CLINIC_OR_DEPARTMENT_OTHER): Attending: Neurosurgery | Admitting: Physical Therapy

## 2024-02-17 ENCOUNTER — Encounter (HOSPITAL_BASED_OUTPATIENT_CLINIC_OR_DEPARTMENT_OTHER): Payer: Self-pay | Admitting: Physical Therapy

## 2024-02-17 DIAGNOSIS — M542 Cervicalgia: Secondary | ICD-10-CM | POA: Insufficient documentation

## 2024-02-17 DIAGNOSIS — R2689 Other abnormalities of gait and mobility: Secondary | ICD-10-CM | POA: Diagnosis not present

## 2024-02-17 NOTE — Therapy (Unsigned)
 OUTPATIENT PHYSICAL THERAPY CERVICAL EVALUATION   Patient Name: Bradley [name] MRN: [id number] DOB:26-Dec-1945, 78 y.o., male Today's Date: 02/17/2024  END OF SESSION:      Past Medical History:  Diagnosis Date   Acquired equinus deformity of foot 12/15/2011   Anemia    Arthritis    Carotid artery occlusion    a. Carotid US  10/16: RICA 60-79%, L CEA patent with 1-39% stenosis   CKD (chronic kidney disease)    stage 3-4   Coagulation disorder ([name]) 05/25/2019   Coronary artery disease    a. Myoview  9/16:  EF 52%, inferior fixed defect consistent with diaphragmatic attenuation, intermediate risk secondary to poor exercise tolerance and symptoms during stress;  b. LHC 05/17/2015 90% mid LAD, 80% ost D2, 75% mid RCA, 35% prox RCA. >> S/p CABG   Coronary artery disease involving coronary bypass graft of native heart with angina pectoris ([name])    CORONARY ARTERY BYPASS GRAFTING x 3 - 05/23/2015 Left internal mammary artery to left anterior descending  Saphenous vein graft to diagonal  Saphenous vein graft to posterior descending Endoscopic greater saphenous vein harvest right thigh     Diabetes mellitus age 17   Diverticulitis    Diverticulosis of colon without hemorrhage 05/17/2015   By CT scan    DM type 2, controlled, with complication ([name]) 04/29/2015   Essential hypertension 04/29/2015   GERD (gastroesophageal reflux disease)    H/O hiatal hernia    History of echocardiogram    a. Echo 9/16: GLS -15.2%, EF 55-60%, grade 1 diastolic dysfunction, normal wall motion, aortic sclerosis, dilated aortic root 39 mm, atrial septal lipomatous hypertrophy   Hyperlipidemia    Hypertension    Irritable bowel syndrome (IBS)    Myocardial infarction ([name])    silent inferior MI; patient denies MI history (03/17/13)    Neuralgia    Neuropathy 2013   OSA on CPAP 05/30/2015   Osteoporosis 2013   PAF (paroxysmal atrial fibrillation) ([name])    post CABG 2016; Amiodarone  stopped 2/2 wheezing; PAF  01/12/20   Peripheral vascular disease ([name])    Plantar fasciitis 12/15/2011   Pneumonia    Reflux esophagitis    Right bundle branch block 04/30/2015   Sleep apnea    uses CPAP   Stenosis of right internal carotid artery with cerebral infarction ([name]) 12/09/2011   Stress fracture of metatarsal bone 02/18/2012   Stroke ([name]) 1987   Right brain stroke- slight drop on left side   Syncope 01/12/2020   Type 2 diabetes mellitus with vascular disease ([name]) 05/30/2015   Wears glasses    Past Surgical History:  Procedure Laterality Date   ABDOMINAL AORTOGRAM W/LOWER EXTREMITY Bilateral 11/27/2020   Procedure: ABDOMINAL AORTOGRAM W/LOWER EXTREMITY;  Surgeon: Serene Gaile ORN, MD;  Location: [facility] INVASIVE CV [facility];  Service: Cardiovascular;  Laterality: Bilateral;   ANTERIOR CERVICAL DECOMP/DISCECTOMY FUSION N/A 08/25/2023   Procedure: CERVICAL THREE-FOUR, CERVICAL FOUR-FIVE, CERVICAL FIVE-SIX ANTERIOR CERVICAL DECOMPRESSION/DISCECTOMY FUSION;  Surgeon: Debby Dorn MATSU, MD;  Location: [facility] OR;  Service: Neurosurgery;  Laterality: N/A;   CARDIAC CATHETERIZATION N/A 05/17/2015   Procedure: Left Heart Cath and Coronary Angiography;  Surgeon: Victory ORN Sharps, MD;  Location: [facility] INVASIVE CV LAB;  Service: Cardiovascular;  Laterality: N/A;   CAROTID ENDARTERECTOMY  1994 & redo 2001   Left   CATARACT EXTRACTION W/ INTRAOCULAR LENS IMPLANT Bilateral    COLONOSCOPY W/ BIOPSIES AND POLYPECTOMY     CORONARY ARTERY BYPASS GRAFT N/A 05/23/2015   Procedure: CORONARY  ARTERY BYPASS GRAFTING (CABG) x 3 (LIMA to LAD, SVG to DIAGONAL 2, SVG to PDA) with Endoscopic Vein Harvesting from right greater saphenous vein;  Surgeon: Dallas KATHEE Jude, MD;  Location: [facility] OR;  Service: Open Heart Surgery;  Laterality: N/A;   ENDARTERECTOMY FEMORAL Right 11/30/2020   Procedure: ENDARTERECTOMY FEMORAL RIGHT;  Surgeon: Oris Krystal FALCON, MD;  Location: [facility] OR;  Service: Vascular;  Laterality: Right;   FEMORAL-POPLITEAL BYPASS GRAFT Right  11/30/2020   Procedure: BYPASS GRAFT FEMORAL-POPLITEAL ARTERY RIGHT;  Surgeon: Oris Krystal FALCON, MD;  Location: [facility] OR;  Service: Vascular;  Laterality: Right;   FRACTURE SURGERY  2013   Right   foo  t X's 2   ILIAC ARTERY STENT     LAPAROSCOPIC CHOLECYSTECTOMY  09/11/2016   POSTERIOR LUMBAR FUSION  03/24/2013   Level 1   PR VEIN BYPASS GRAFT,AORTO-FEM-POP  1998   SPINE SURGERY  2014   TEE WITHOUT CARDIOVERSION N/A 05/23/2015   Procedure: TRANSESOPHAGEAL ECHOCARDIOGRAM (TEE);  Surgeon: Dallas KATHEE Jude, MD;  Location: [facility] OR;  Service: Open Heart Surgery;  Laterality: N/A;   TONSILLECTOMY     TRANSCAROTID ARTERY REVASCULARIZATION  Right 06/01/2020   Procedure: RIGHT TRANSCAROTID ARTERY REVASCULARIZATION;  Surgeon: Serene Gaile ORN, MD;  Location: [facility] OR;  Service: Vascular;  Laterality: Right;   TRIGGER FINGER RELEASE Right 07/14/2017   Procedure: RIGHT LONG FINGER TRIGGER RELEASE;  Surgeon: Sebastian Lenis, MD;  Location: [facility];  Service: Orthopedics;  Laterality: Right;   Patient Active Problem List   Diagnosis Date Noted   Cervical myelopathy ([name]) 08/25/2023   Diabetic neuropathy ([name]) 06/03/2022   PAD (peripheral artery disease) ([name]) 11/30/2020   Carotid stenosis 06/01/2020   Carotid stenosis, asymptomatic 06/01/2020   Syncope 01/12/2020   Coagulation disorder ([name]) 05/25/2019   Coronary artery disease involving coronary bypass graft of native heart with angina pectoris ([name])    PAF (paroxysmal atrial fibrillation) ([name])    Type 2 diabetes mellitus with vascular disease ([name]) 05/30/2015   OSA on CPAP 05/30/2015   Abnormal stress test 05/18/2015   Diverticulosis of colon without hemorrhage 05/17/2015   Right bundle branch block 04/30/2015   Essential hypertension 04/29/2015   Hyperlipidemia 04/29/2015   DM type 2, controlled, with complication ([name]) 04/29/2015   Peripheral vascular disease ([name]) 06/08/2012   Stress fracture of metatarsal bone 02/18/2012    Acquired equinus deformity of foot 12/15/2011   Plantar fasciitis 12/15/2011   Stenosis of right internal carotid artery with cerebral infarction ([name]) 12/09/2011    PCP: Carlin Gull MD  REFERRING PROVIDER: Debby Dorn MATSU, MD  REFERRING DIAG: [phone] (ICD-10-CM) - Other spondylosis with myelopathy, cervical region   THERAPY DIAG:  No diagnosis found.  Rationale for Evaluation and Treatment: Rehabilitation  ONSET DATE: DOS: 1/14 fusion of c3-c6  SUBJECTIVE:  SUBJECTIVE STATEMENT: The patient continues to have improvement in his neck. He has had several days without pain. Last night he did have some pain. His legs have been a problem. They are preventing him from being above to stand.   Eval: Patient has long history of cervical pain.  He is found to have cervical spinal cord compression.  He has cervical fixation from C3-C6 08/25/2023.  He has increased pain since that point.  He reports pain with starts in his neck and goes down into both shoulders.  He also reports weakness in bilateral upper extremities.    Hand dominance: Right  PERTINENT HISTORY:  Anemia, acquired equinus deformity; CAD< CKD, DMII, IBS, MI, MI, neuralgia, PF, carotid stenosis Stroke, Torn Rotator cuff; CABG; vein bypass graft 1998; Spine surgery in 2014     PAIN:  PAIN:  Are you having pain? Yes: NPRS scale: 5/10 Pain location: cervical spine into bilateral shoulders  Pain description: aching Aggravating factors: UE activity  Relieving factors:  rest  PRECAUTIONS: None  RED FLAGS: None     WEIGHT BEARING RESTRICTIONS: No  FALLS:  Has patient fallen in last 6 months? No    OCCUPATION:  Lives   PLOF: Independent  PATIENT GOALS: get pain down and get stronger  NEXT MD VISIT:  12/10/2023  OBJECTIVE:  Note: Objective measures were completed at Evaluation unless otherwise noted.  DIAGNOSTIC FINDINGS:  Surgery: CERVICAL THREE-FOUR, CERVICAL FOUR-FIVE, CERVICAL FIVE-SIX ANTERIOR CERVICAL DECOMPRESSION/DISCECTOMY FUSION   PATIENT SURVEYS:  NDI    COGNITION: Overall cognitive status: Within functional limits for tasks assessed  SENSATION: WFL  POSTURE: rounded shoulders, forward head, and flexed trunk   PALPATION: Spasming in cervical spine and into peri-scapular area  CERVICAL ROM:   Active ROM A/PROM (deg) eval AROM 6/18   Flexion 25   Extension 20   Right lateral flexion 2   Left lateral flexion 2   Right rotation 42 54  Left rotation 40 63   (Blank rows = not tested)  UPPER EXTREMITY ROM:  Active ROM Right eval Left eval  Shoulder flexion 135 120  Shoulder extension    Shoulder abduction 155 145  Shoulder adduction    Shoulder extension    Shoulder internal rotation T12 T12  Shoulder external rotation T1 Base of occiput  Elbow flexion    Elbow extension    Wrist flexion    Wrist extension    Wrist ulnar deviation    Wrist radial deviation    Wrist pronation    Wrist supination     (Blank rows = not tested)  UPPER EXTREMITY MMT:  MMT Right eval Left eval  Shoulder flexion 4 4-  Shoulder extension    Shoulder abduction    Shoulder adduction    Shoulder extension    Shoulder internal rotation    Shoulder external rotation    Middle trapezius    Lower trapezius    Elbow flexion    Elbow extension    Wrist flexion    Wrist extension    Wrist ulnar deviation    Wrist radial deviation    Wrist pronation    Wrist supination    Grip strength     (Blank rows = not tested)  Grip test:  R: 50lbs  L: 20lbs  CERVICAL SPECIAL TESTS:    FUNCTIONAL TESTS:  Feet together eyes closed: able to complete, moderate swaying Tandem eyes closed: able to complete moderate swaying  Treatment Date: 6/25  Manual:  Trigger point  release to upper traps, cervical paraspinals, levator. Suboccipital release  There-ex:  Seated Hip abduction 20 green  Seated March x20 Green  Seated LAQ  yellow 2x10 each leg    Neuro-re-ed  Punches 3x12 3lbs  Bicep curls 3x12 3lbs  Bilateral ER red 3x12   6/18 Manual:  Trigger point release to upper traps, cervical paraspinals, levator. Suboccipital release  Neuro-re-ed  Punches 3x12 3lbs  Bicep curls 3x12 3lbs  Bilateral ER red 3x12   There-ex: anterior chest wand stretch   6/11  Manual:  Trigger point release to upper traps, cervical paraspinals, levator. Suboccipital release  Neuro-re-ed  Punches 3x12 2lbs  Bicep curls 3x12 2lbs   With cuing for posture   Row 15 lbs 2x12  Shoulder extension 2x15 10 lbs       PATIENT EDUCATION:  Education details: Symptom management, HEP, self care Person educated: Patient Education method: Explanation, Demonstration, Tactile cues, Verbal cues, and Handouts Education comprehension: verbalized understanding and returned demonstration  HOME EXERCISE PROGRAM: [id number]  ASSESSMENT:  CLINICAL IMPRESSION: Therapy gave the patient exercises that will help his standing posture. His neck is improving. Per palpation he is having less spasming. His motion is improving. Therapy advised the patient if he has pain in his legs to stop the exercise. We reviewed grading his exercises at home.    Eval:  Patient is a 78 year old male status post C3-C6 cervical fixation on 08/24/2022.  At this point he presents with limited upper extremity strength, limited cervical mobility, and pain with functional activities.  He has decreased grip strength on the left compared to the right.  He would benefit from skilled therapy to improve ability to use arms for functional activity.  OBJECTIVE IMPAIRMENTS: Abnormal gait, decreased activity tolerance, decreased balance, decreased endurance, difficulty walking, decreased ROM, decreased strength,  increased fascial restrictions, increased muscle spasms, postural dysfunction, and pain.   ACTIVITY LIMITATIONS: carrying, lifting, sitting, standing, squatting, transfers, dressing, self feeding, reach over head, and hygiene/grooming  PARTICIPATION LIMITATIONS: meal prep, cleaning, laundry, driving, shopping, community activity, and yard work  PERSONAL FACTORS: 3+ comorbidities: multi joint OA, low back pain, right leg stent failure  are also affecting patient's functional outcome.   REHAB POTENTIAL: Good  CLINICAL DECISION MAKING: Evolving/moderate complexity  EVALUATION COMPLEXITY: Moderate   GOALS: Goals reviewed with patient? Yes  SHORT TERM GOALS: Target date: 12/29/2023    Patient will increase cervical rotation by 10 degrees  Baseline:  Goal status: no change at this time 6/5   2.  Patient will demonstrate full flexion of bilateral shoulders  Baseline:  Goal status: still limited 6/18   3.  Patient will increase grip strength by 10 lbs  Baseline:  Goal status: INITIAL L  LONG TERM GOALS: Target date: 01/26/2024    Patient will perform ADL's without neck pain  Baseline:  Goal status: INITIAL  2.  Patient will demonstrate 50 degrees of cervical rotation in order to drive  Baseline:  Goal status: INITIAL  3.  Patient will demonstrate good functional balance in order to improve safety  Baseline:  Goal status: INITIAL     PLAN:  PT FREQUENCY: 1-2x/week  PT DURATION: 8 weeks  PLANNED INTERVENTIONS:   PLAN FOR NEXT SESSION: Consider trigger point release to bilateral upper trap, cervical paraspinals, and parascapular muscles.  Continue with postural exercises.   Alm JINNY Don, PT 02/17/2024, 11:18 AM

## 2024-02-18 ENCOUNTER — Encounter (HOSPITAL_BASED_OUTPATIENT_CLINIC_OR_DEPARTMENT_OTHER): Payer: Self-pay | Admitting: Physical Therapy

## 2024-02-18 NOTE — Addendum Note (Signed)
 Addended by: DOW ALM PARAS on: 02/18/2024 11:29 AM   Modules accepted: Orders

## 2024-02-25 DIAGNOSIS — R809 Proteinuria, unspecified: Secondary | ICD-10-CM | POA: Diagnosis not present

## 2024-02-25 DIAGNOSIS — E871 Hypo-osmolality and hyponatremia: Secondary | ICD-10-CM | POA: Diagnosis not present

## 2024-02-25 DIAGNOSIS — I2510 Atherosclerotic heart disease of native coronary artery without angina pectoris: Secondary | ICD-10-CM | POA: Diagnosis not present

## 2024-02-25 DIAGNOSIS — E1129 Type 2 diabetes mellitus with other diabetic kidney complication: Secondary | ICD-10-CM | POA: Diagnosis not present

## 2024-02-25 DIAGNOSIS — I639 Cerebral infarction, unspecified: Secondary | ICD-10-CM | POA: Diagnosis not present

## 2024-02-25 DIAGNOSIS — N184 Chronic kidney disease, stage 4 (severe): Secondary | ICD-10-CM | POA: Diagnosis not present

## 2024-02-25 DIAGNOSIS — N189 Chronic kidney disease, unspecified: Secondary | ICD-10-CM | POA: Diagnosis not present

## 2024-02-25 DIAGNOSIS — I129 Hypertensive chronic kidney disease with stage 1 through stage 4 chronic kidney disease, or unspecified chronic kidney disease: Secondary | ICD-10-CM | POA: Diagnosis not present

## 2024-02-25 DIAGNOSIS — E1122 Type 2 diabetes mellitus with diabetic chronic kidney disease: Secondary | ICD-10-CM | POA: Diagnosis not present

## 2024-02-25 DIAGNOSIS — E785 Hyperlipidemia, unspecified: Secondary | ICD-10-CM | POA: Diagnosis not present

## 2024-02-25 DIAGNOSIS — Z87898 Personal history of other specified conditions: Secondary | ICD-10-CM | POA: Diagnosis not present

## 2024-02-25 DIAGNOSIS — N179 Acute kidney failure, unspecified: Secondary | ICD-10-CM | POA: Diagnosis not present

## 2024-02-26 ENCOUNTER — Encounter (HOSPITAL_BASED_OUTPATIENT_CLINIC_OR_DEPARTMENT_OTHER): Payer: Self-pay

## 2024-02-26 ENCOUNTER — Ambulatory Visit (HOSPITAL_BASED_OUTPATIENT_CLINIC_OR_DEPARTMENT_OTHER): Admitting: Physical Therapy

## 2024-02-29 ENCOUNTER — Ambulatory Visit (HOSPITAL_BASED_OUTPATIENT_CLINIC_OR_DEPARTMENT_OTHER): Admitting: Physical Therapy

## 2024-02-29 DIAGNOSIS — M542 Cervicalgia: Secondary | ICD-10-CM | POA: Diagnosis not present

## 2024-02-29 DIAGNOSIS — Z6829 Body mass index (BMI) 29.0-29.9, adult: Secondary | ICD-10-CM | POA: Diagnosis not present

## 2024-02-29 DIAGNOSIS — R2689 Other abnormalities of gait and mobility: Secondary | ICD-10-CM

## 2024-02-29 DIAGNOSIS — M4712 Other spondylosis with myelopathy, cervical region: Secondary | ICD-10-CM | POA: Diagnosis not present

## 2024-02-29 NOTE — Therapy (Signed)
 OUTPATIENT PHYSICAL THERAPY CERVICAL EVALUATION   Patient Name: Bradley [name] MRN: [id number] DOB:16-May-1946, 78 y.o., male Today's Date: 03/01/2024  END OF SESSION:  PT End of Session - 03/01/24 1141     Visit Number 7    Number of Visits 16    Date for PT Re-Evaluation 04/14/24    Authorization Type progress note done on visit 12 next to be done on 22    PT Start Time 1315    PT Stop Time 1358    PT Time Calculation (min) 43 min    Activity Tolerance Patient tolerated treatment well;No increased pain    Behavior During Therapy [facility] for tasks assessed/performed              Past Medical History:  Diagnosis Date   Acquired equinus deformity of foot 12/15/2011   Anemia    Arthritis    Carotid artery occlusion    a. Carotid US  10/16: RICA 60-79%, L CEA patent with 1-39% stenosis   CKD (chronic kidney disease)    stage 3-4   Coagulation disorder ([name]) 05/25/2019   Coronary artery disease    a. Myoview  9/16:  EF 52%, inferior fixed defect consistent with diaphragmatic attenuation, intermediate risk secondary to poor exercise tolerance and symptoms during stress;  b. LHC 05/17/2015 90% mid LAD, 80% ost D2, 75% mid RCA, 35% prox RCA. >> S/p CABG   Coronary artery disease involving coronary bypass graft of native heart with angina pectoris ([name])    CORONARY ARTERY BYPASS GRAFTING x 3 - 05/23/2015 Left internal mammary artery to left anterior descending  Saphenous vein graft to diagonal  Saphenous vein graft to posterior descending Endoscopic greater saphenous vein harvest right thigh     Diabetes mellitus age 70   Diverticulitis    Diverticulosis of colon without hemorrhage 05/17/2015   By CT scan    DM type 2, controlled, with complication ([name]) 04/29/2015   Essential hypertension 04/29/2015   GERD (gastroesophageal reflux disease)    H/O hiatal hernia    History of echocardiogram    a. Echo 9/16: GLS -15.2%, EF 55-60%, grade 1 diastolic dysfunction, normal wall motion,  aortic sclerosis, dilated aortic root 39 mm, atrial septal lipomatous hypertrophy   Hyperlipidemia    Hypertension    Irritable bowel syndrome (IBS)    Myocardial infarction ([name])    silent inferior MI; patient denies MI history (03/17/13)    Neuralgia    Neuropathy 2013   OSA on CPAP 05/30/2015   Osteoporosis 2013   PAF (paroxysmal atrial fibrillation) ([name])    post CABG 2016; Amiodarone  stopped 2/2 wheezing; PAF 01/12/20   Peripheral vascular disease ([name])    Plantar fasciitis 12/15/2011   Pneumonia    Reflux esophagitis    Right bundle branch block 04/30/2015   Sleep apnea    uses CPAP   Stenosis of right internal carotid artery with cerebral infarction ([name]) 12/09/2011   Stress fracture of metatarsal bone 02/18/2012   Stroke ([name]) 1987   Right brain stroke- slight drop on left side   Syncope 01/12/2020   Type 2 diabetes mellitus with vascular disease ([name]) 05/30/2015   Wears glasses    Past Surgical History:  Procedure Laterality Date   ABDOMINAL AORTOGRAM W/LOWER EXTREMITY Bilateral 11/27/2020   Procedure: ABDOMINAL AORTOGRAM W/LOWER EXTREMITY;  Surgeon: Serene Gaile ORN, MD;  Location: [facility] INVASIVE CV LAB;  Service: Cardiovascular;  Laterality: Bilateral;   ANTERIOR CERVICAL DECOMP/DISCECTOMY FUSION N/A 08/25/2023   Procedure: CERVICAL THREE-FOUR, CERVICAL  FOUR-FIVE, CERVICAL FIVE-SIX ANTERIOR CERVICAL DECOMPRESSION/DISCECTOMY FUSION;  Surgeon: Debby Dorn MATSU, MD;  Location: [facility] OR;  Service: Neurosurgery;  Laterality: N/A;   CARDIAC CATHETERIZATION N/A 05/17/2015   Procedure: Left Heart Cath and Coronary Angiography;  Surgeon: Victory LELON Sharps, MD;  Location: [facility] INVASIVE CV LAB;  Service: Cardiovascular;  Laterality: N/A;   CAROTID ENDARTERECTOMY  1994 & redo 2001   Left   CATARACT EXTRACTION W/ INTRAOCULAR LENS IMPLANT Bilateral    COLONOSCOPY W/ BIOPSIES AND POLYPECTOMY     CORONARY ARTERY BYPASS GRAFT N/A 05/23/2015   Procedure: CORONARY ARTERY BYPASS GRAFTING (CABG) x 3  (LIMA to LAD, SVG to DIAGONAL 2, SVG to PDA) with Endoscopic Vein Harvesting from right greater saphenous vein;  Surgeon: Dallas KATHEE Jude, MD;  Location: [facility];  Service: Open Heart Surgery;  Laterality: N/A;   ENDARTERECTOMY FEMORAL Right 11/30/2020   Procedure: ENDARTERECTOMY FEMORAL RIGHT;  Surgeon: Oris Krystal FALCON, MD;  Location: [facility] OR;  Service: Vascular;  Laterality: Right;   FEMORAL-POPLITEAL BYPASS GRAFT Right 11/30/2020   Procedure: BYPASS GRAFT FEMORAL-POPLITEAL ARTERY RIGHT;  Surgeon: Oris Krystal FALCON, MD;  Location: [facility] OR;  Service: Vascular;  Laterality: Right;   FRACTURE SURGERY  2013   Right   foo  t X's 2   ILIAC ARTERY STENT     LAPAROSCOPIC CHOLECYSTECTOMY  09/11/2016   POSTERIOR LUMBAR FUSION  03/24/2013   Level 1   PR VEIN BYPASS GRAFT,AORTO-FEM-POP  1998   SPINE SURGERY  2014   TEE WITHOUT CARDIOVERSION N/A 05/23/2015   Procedure: TRANSESOPHAGEAL ECHOCARDIOGRAM (TEE);  Surgeon: Dallas KATHEE Jude, MD;  Location: [facility] OR;  Service: Open Heart Surgery;  Laterality: N/A;   TONSILLECTOMY     TRANSCAROTID ARTERY REVASCULARIZATION  Right 06/01/2020   Procedure: RIGHT TRANSCAROTID ARTERY REVASCULARIZATION;  Surgeon: Serene Gaile LELON, MD;  Location: [facility] OR;  Service: Vascular;  Laterality: Right;   TRIGGER FINGER RELEASE Right 07/14/2017   Procedure: RIGHT LONG FINGER TRIGGER RELEASE;  Surgeon: Sebastian Lenis, MD;  Location: [facility];  Service: Orthopedics;  Laterality: Right;   Patient Active Problem List   Diagnosis Date Noted   Cervical myelopathy ([name]) 08/25/2023   Diabetic neuropathy ([name]) 06/03/2022   PAD (peripheral artery disease) ([name]) 11/30/2020   Carotid stenosis 06/01/2020   Carotid stenosis, asymptomatic 06/01/2020   Syncope 01/12/2020   Coagulation disorder ([name]) 05/25/2019   Coronary artery disease involving coronary bypass graft of native heart with angina pectoris ([name])    PAF (paroxysmal atrial fibrillation) ([name])    Type 2 diabetes mellitus  with vascular disease ([name]) 05/30/2015   OSA on CPAP 05/30/2015   Abnormal stress test 05/18/2015   Diverticulosis of colon without hemorrhage 05/17/2015   Right bundle branch block 04/30/2015   Essential hypertension 04/29/2015   Hyperlipidemia 04/29/2015   DM type 2, controlled, with complication ([name]) 04/29/2015   Peripheral vascular disease ([name]) 06/08/2012   Stress fracture of metatarsal bone 02/18/2012   Acquired equinus deformity of foot 12/15/2011   Plantar fasciitis 12/15/2011   Stenosis of right internal carotid artery with cerebral infarction ([name]) 12/09/2011  Progress Note Reporting Period 12/01/2023 to 01/27/2024  See note below for Objective Data and Assessment of Progress/Goals.       PCP: Carlin Gull MD  REFERRING PROVIDER: Debby Dorn MATSU, MD  REFERRING DIAG: [id number] (ICD-10-CM) - Other spondylosis with myelopathy, cervical region   THERAPY DIAG:  Cervicalgia  Other abnormalities of gait and mobility  Rationale for Evaluation and Treatment: Rehabilitation  ONSET DATE: DOS: 1/14 fusion of c3-c6  SUBJECTIVE:                                                                                                                                                                                                         SUBJECTIVE STATEMENT: The patient has been to the MD. The MD is happy with the progress in his neck. He is still having leg and balance issues.   Eval: Patient has long history of cervical pain.  He is found to have cervical spinal cord compression.  He has cervical fixation from C3-C6 08/25/2023.  He has increased pain since that point.  He reports pain with starts in his neck and goes down into both shoulders.  He also reports weakness in bilateral upper extremities.    Hand dominance: Right  PERTINENT HISTORY:  Anemia, acquired equinus deformity; CAD< CKD, DMII, IBS, MI, MI, neuralgia, PF, carotid stenosis Stroke, Torn Rotator cuff; CABG; vein bypass  graft 1998; Spine surgery in 2014     PAIN:  PAIN:  Are you having pain? Yes: NPRS scale: 5/10 Pain location: cervical spine into bilateral shoulders  Pain description: aching Aggravating factors: UE activity  Relieving factors:  rest  PRECAUTIONS: None  RED FLAGS: None     WEIGHT BEARING RESTRICTIONS: No  FALLS:  Has patient fallen in last 6 months? No    OCCUPATION:  Lives   PLOF: Independent  PATIENT GOALS: get pain down and get stronger  NEXT MD VISIT: 12/10/2023  OBJECTIVE:  Note: Objective measures were completed at Evaluation unless otherwise noted.  DIAGNOSTIC FINDINGS:  Surgery: CERVICAL THREE-FOUR, CERVICAL FOUR-FIVE, CERVICAL FIVE-SIX ANTERIOR CERVICAL DECOMPRESSION/DISCECTOMY FUSION   PATIENT SURVEYS:  NDI    COGNITION: Overall cognitive status: Within functional limits for tasks assessed  SENSATION: WFL  POSTURE: rounded shoulders, forward head, and flexed trunk   PALPATION: Spasming in cervical spine and into peri-scapular area  CERVICAL ROM:   Active ROM A/PROM (deg) eval AROM 6/18   Flexion 25   Extension 20   Right lateral flexion 2   Left lateral flexion 2   Right rotation 42 54  Left rotation 40 63   (Blank rows = not tested)  UPPER EXTREMITY ROM:  Active ROM Right eval Left eval  Shoulder flexion 135 120  Shoulder extension    Shoulder abduction 155 145  Shoulder adduction    Shoulder extension    Shoulder internal rotation T12 T12  Shoulder external rotation T1 Base of occiput  Elbow flexion    Elbow extension  Wrist flexion    Wrist extension    Wrist ulnar deviation    Wrist radial deviation    Wrist pronation    Wrist supination     (Blank rows = not tested)  UPPER EXTREMITY MMT:  MMT Right eval Left eval Right  6/18 Left  6/18  Shoulder flexion 4 4- 4+ 4+  Shoulder extension      Shoulder abduction      Shoulder adduction      Shoulder extension      Shoulder internal rotation      Shoulder  external rotation      Middle trapezius      Lower trapezius      Elbow flexion      Elbow extension      Wrist flexion      Wrist extension      Wrist ulnar deviation      Wrist radial deviation      Wrist pronation      Wrist supination      Grip strength       (Blank rows = not tested)  Grip test:  R: 50lbs  L: 20lbs  CERVICAL SPECIAL TESTS:    FUNCTIONAL TESTS:  Feet together eyes closed: able to complete, moderate swaying Tandem eyes closed: able to complete moderate swaying  Treatment Date:  7/21 Manual:  Trigger point release to upper traps, cervical paraspinals, levator. Suboccipital release  Trigger point release to mid thoracic paraspinals   There-ex:  Ball roll out for back 10x 5 sec hold forward  Lateral step 5x 5 sec hold   Seated Hip abduction 20 green  Seated March x20 Green  Seated LAQ   2x10 each leg    Neuro-re-ed  Row green 3x12 with TA breathing  Shoulder extension 3x12 with TA breathing      7/9 Manual:  Trigger point release to upper traps, cervical paraspinals, levator. Suboccipital release   There-ex:  Seated Hip abduction 20 green  Seated March x20 Green  Seated LAQ  yellow 2x10 each leg   Neuro-re-ed  Punches 3x12 3lbs  Bicep curls 3x12 3lbs  Bilateral ER red 3x12     6/25  Manual:  Trigger point release to upper traps, cervical paraspinals, levator. Suboccipital release  There-ex:  Seated Hip abduction 20 green  Seated March x20 Green  Seated LAQ  yellow 2x10 each leg    Neuro-re-ed  Punches 3x12 3lbs  Bicep curls 3x12 3lbs  Bilateral ER red 3x12   6/18 Manual:  Trigger point release to upper traps, cervical paraspinals, levator. Suboccipital release  Neuro-re-ed  Punches 3x12 3lbs  Bicep curls 3x12 3lbs  Bilateral ER red 3x12   There-ex: anterior chest wand stretch   6/11  Manual:  Trigger point release to upper traps, cervical paraspinals, levator. Suboccipital release  Neuro-re-ed   Punches 3x12 2lbs  Bicep curls 3x12 2lbs   With cuing for posture   Row 15 lbs 2x12  Shoulder extension 2x15 10 lbs       PATIENT EDUCATION:  Education details: Symptom management, HEP, self care Person educated: Patient Education method: Explanation, Demonstration, Tactile cues, Verbal cues, and Handouts Education comprehension: verbalized understanding and returned demonstration  HOME EXERCISE PROGRAM: [id number]  ASSESSMENT:  CLINICAL IMPRESSION: The patient was given standing postural  exercises to work on today. He tolerated well. Red was easy so we moved him up to green. His neck is improving. He had less spasming in his neck today. He did  have mid thoracic spasming, We worked on manual therapy for that area. We also continue to work on lower extremity muscles to improve standing posture.    Eval:  Patient is a 78 year old male status post C3-C6 cervical fixation on 08/24/2022.  At this point he presents with limited upper extremity strength, limited cervical mobility, and pain with functional activities.  He has decreased grip strength on the left compared to the right.  He would benefit from skilled therapy to improve ability to use arms for functional activity.  OBJECTIVE IMPAIRMENTS: Abnormal gait, decreased activity tolerance, decreased balance, decreased endurance, difficulty walking, decreased ROM, decreased strength, increased fascial restrictions, increased muscle spasms, postural dysfunction, and pain.   ACTIVITY LIMITATIONS: carrying, lifting, sitting, standing, squatting, transfers, dressing, self feeding, reach over head, and hygiene/grooming  PARTICIPATION LIMITATIONS: meal prep, cleaning, laundry, driving, shopping, community activity, and yard work  PERSONAL FACTORS: 3+ comorbidities: multi joint OA, low back pain, right leg stent failure  are also affecting patient's functional outcome.   REHAB POTENTIAL: Good  CLINICAL DECISION MAKING: Evolving/moderate  complexity  EVALUATION COMPLEXITY: Moderate   GOALS: Goals reviewed with patient? Yes  SHORT TERM GOALS: Target date: 12/29/2023    Patient will increase cervical rotation by 10 degrees  Baseline:  Goal status: has progressed but not reached yet 6/18   2.  Patient will demonstrate full flexion of bilateral shoulders  Baseline:  Goal status: improving 6/18   3.  Patient will increase grip strength by 10 lbs  Baseline:  Goal status not tested 6/18 L  LONG TERM GOALS: Target date: 01/26/2024    Patient will perform ADL's without neck pain  Baseline:  Goal status: begging to be able to 6/18  2.  Patient will demonstrate 50 degrees of cervical rotation in order to drive  Baseline:  Goal status: achieved 6/18    3.  Patient will demonstrate good functional balance in order to improve safety  Baseline:  Goal status: still limited      PLAN:  PT FREQUENCY: 1-2x/week  PT DURATION: 8 weeks  PLANNED INTERVENTIONS:   PLAN FOR NEXT SESSION: Consider trigger point release to bilateral upper trap, cervical paraspinals, and parascapular muscles.  Continue with postural exercises.   Alm JINNY Don, PT 03/01/2024, 2:08 PM

## 2024-03-01 ENCOUNTER — Encounter (HOSPITAL_BASED_OUTPATIENT_CLINIC_OR_DEPARTMENT_OTHER): Payer: Self-pay | Admitting: Physical Therapy

## 2024-03-08 ENCOUNTER — Ambulatory Visit (HOSPITAL_BASED_OUTPATIENT_CLINIC_OR_DEPARTMENT_OTHER): Admitting: Physical Therapy

## 2024-03-08 DIAGNOSIS — M542 Cervicalgia: Secondary | ICD-10-CM | POA: Diagnosis not present

## 2024-03-08 DIAGNOSIS — G4733 Obstructive sleep apnea (adult) (pediatric): Secondary | ICD-10-CM | POA: Diagnosis not present

## 2024-03-08 DIAGNOSIS — R2689 Other abnormalities of gait and mobility: Secondary | ICD-10-CM

## 2024-03-09 ENCOUNTER — Encounter (HOSPITAL_BASED_OUTPATIENT_CLINIC_OR_DEPARTMENT_OTHER): Payer: Self-pay | Admitting: Physical Therapy

## 2024-03-09 NOTE — Therapy (Signed)
 OUTPATIENT PHYSICAL THERAPY CERVICAL EVALUATION   Patient Name: Bradley [name] MRN: [id number] DOB:[birth year], 78 y.o., male Today's Date: 03/09/2024  END OF SESSION:  PT End of Session - 03/09/24 0847     Visit Number 8    Number of Visits 16    Date for PT Re-Evaluation 04/14/24    Authorization Type progress note done on visit 12 next to be done on 22    PT Start Time 1345    PT Stop Time 1425    PT Time Calculation (min) 40 min    Activity Tolerance Patient tolerated treatment well;No increased pain    Behavior During Therapy [facility] for tasks assessed/performed              Past Medical History:  Diagnosis Date   Acquired equinus deformity of foot 12/15/2011   Anemia    Arthritis    Carotid artery occlusion    a. Carotid US  10/16: RICA 60-79%, L CEA patent with 1-39% stenosis   CKD (chronic kidney disease)    stage 3-4   Coagulation disorder ([name]) 05/25/2019   Coronary artery disease    a. Myoview  9/16:  EF 52%, inferior fixed defect consistent with diaphragmatic attenuation, intermediate risk secondary to poor exercise tolerance and symptoms during stress;  b. LHC 05/17/2015 90% mid LAD, 80% ost D2, 75% mid RCA, 35% prox RCA. >> S/p CABG   Coronary artery disease involving coronary bypass graft of native heart with angina pectoris ([name])    CORONARY ARTERY BYPASS GRAFTING x 3 - 05/23/2015 Left internal mammary artery to left anterior descending  Saphenous vein graft to diagonal  Saphenous vein graft to posterior descending Endoscopic greater saphenous vein harvest right thigh     Diabetes mellitus age 15   Diverticulitis    Diverticulosis of colon without hemorrhage 05/17/2015   By CT scan    DM type 2, controlled, with complication ([name]) 04/29/2015   Essential hypertension 04/29/2015   GERD (gastroesophageal reflux disease)    H/O hiatal hernia    History of echocardiogram    a. Echo 9/16: GLS -15.2%, EF 55-60%, grade 1 diastolic dysfunction, normal wall motion,  aortic sclerosis, dilated aortic root 39 mm, atrial septal lipomatous hypertrophy   Hyperlipidemia    Hypertension    Irritable bowel syndrome (IBS)    Myocardial infarction ([name])    silent inferior MI; patient denies MI history (03/17/13)    Neuralgia    Neuropathy 2013   OSA on CPAP 05/30/2015   Osteoporosis 2013   PAF (paroxysmal atrial fibrillation) ([name])    post CABG 2016; Amiodarone  stopped 2/2 wheezing; PAF 01/12/20   Peripheral vascular disease ([name])    Plantar fasciitis 12/15/2011   Pneumonia    Reflux esophagitis    Right bundle branch block 04/30/2015   Sleep apnea    uses CPAP   Stenosis of right internal carotid artery with cerebral infarction ([name]) 12/09/2011   Stress fracture of metatarsal bone 02/18/2012   Stroke ([name]) 1987   Right brain stroke- slight drop on left side   Syncope 01/12/2020   Type 2 diabetes mellitus with vascular disease ([name]) 05/30/2015   Wears glasses    Past Surgical History:  Procedure Laterality Date   ABDOMINAL AORTOGRAM W/LOWER EXTREMITY Bilateral 11/27/2020   Procedure: ABDOMINAL AORTOGRAM W/LOWER EXTREMITY;  Surgeon: Serene Gaile ORN, MD;  Location: [facility] INVASIVE CV LAB;  Service: Cardiovascular;  Laterality: Bilateral;   ANTERIOR CERVICAL DECOMP/DISCECTOMY FUSION N/A 08/25/2023   Procedure: CERVICAL THREE-FOUR, CERVICAL  FOUR-FIVE, CERVICAL FIVE-SIX ANTERIOR CERVICAL DECOMPRESSION/DISCECTOMY FUSION;  Surgeon: Debby Dorn MATSU, MD;  Location: [facility];  Service: Neurosurgery;  Laterality: N/A;   CARDIAC CATHETERIZATION N/A 05/17/2015   Procedure: Left Heart Cath and Coronary Angiography;  Surgeon: Victory LELON Sharps, MD;  Location: [facility] INVASIVE CV LAB;  Service: Cardiovascular;  Laterality: N/A;   CAROTID ENDARTERECTOMY  1994 & redo 2001   Left   CATARACT EXTRACTION W/ INTRAOCULAR LENS IMPLANT Bilateral    COLONOSCOPY W/ BIOPSIES AND POLYPECTOMY     CORONARY ARTERY BYPASS GRAFT N/A 05/23/2015   Procedure: CORONARY ARTERY BYPASS GRAFTING (CABG) x 3  (LIMA to LAD, SVG to DIAGONAL 2, SVG to PDA) with Endoscopic Vein Harvesting from right greater saphenous vein;  Surgeon: Dallas KATHEE Jude, MD;  Location: [facility] OR;  Service: Open Heart Surgery;  Laterality: N/A;   ENDARTERECTOMY FEMORAL Right 11/30/2020   Procedure: ENDARTERECTOMY FEMORAL RIGHT;  Surgeon: Oris Krystal FALCON, MD;  Location: [facility] OR;  Service: Vascular;  Laterality: Right;   FEMORAL-POPLITEAL BYPASS GRAFT Right 11/30/2020   Procedure: BYPASS GRAFT FEMORAL-POPLITEAL ARTERY RIGHT;  Surgeon: Oris Krystal FALCON, MD;  Location: [facility] OR;  Service: Vascular;  Laterality: Right;   FRACTURE SURGERY  2013   Right   foo  t X's 2   ILIAC ARTERY STENT     LAPAROSCOPIC CHOLECYSTECTOMY  09/11/2016   POSTERIOR LUMBAR FUSION  03/24/2013   Level 1   PR VEIN BYPASS GRAFT,AORTO-FEM-POP  1998   SPINE SURGERY  2014   TEE WITHOUT CARDIOVERSION N/A 05/23/2015   Procedure: TRANSESOPHAGEAL ECHOCARDIOGRAM (TEE);  Surgeon: Dallas KATHEE Jude, MD;  Location: [facility] OR;  Service: Open Heart Surgery;  Laterality: N/A;   TONSILLECTOMY     TRANSCAROTID ARTERY REVASCULARIZATION  Right 06/01/2020   Procedure: RIGHT TRANSCAROTID ARTERY REVASCULARIZATION;  Surgeon: Serene Gaile LELON, MD;  Location: [facility] OR;  Service: Vascular;  Laterality: Right;   TRIGGER FINGER RELEASE Right 07/14/2017   Procedure: RIGHT LONG FINGER TRIGGER RELEASE;  Surgeon: Sebastian Lenis, MD;  Location: [facility];  Service: Orthopedics;  Laterality: Right;   Patient Active Problem List   Diagnosis Date Noted   Cervical myelopathy ([name]) 08/25/2023   Diabetic neuropathy ([name]) 06/03/2022   PAD (peripheral artery disease) ([name]) 11/30/2020   Carotid stenosis 06/01/2020   Carotid stenosis, asymptomatic 06/01/2020   Syncope 01/12/2020   Coagulation disorder ([name]) 05/25/2019   Coronary artery disease involving coronary bypass graft of native heart with angina pectoris ([name])    PAF (paroxysmal atrial fibrillation) ([name])    Type 2 diabetes mellitus  with vascular disease ([name]) 05/30/2015   OSA on CPAP 05/30/2015   Abnormal stress test 05/18/2015   Diverticulosis of colon without hemorrhage 05/17/2015   Right bundle branch block 04/30/2015   Essential hypertension 04/29/2015   Hyperlipidemia 04/29/2015   DM type 2, controlled, with complication ([name]) 04/29/2015   Peripheral vascular disease ([name]) 06/08/2012   Stress fracture of metatarsal bone 02/18/2012   Acquired equinus deformity of foot 12/15/2011   Plantar fasciitis 12/15/2011   Stenosis of right internal carotid artery with cerebral infarction ([name]) 12/09/2011  Progress Note Reporting Period 12/01/2023 to 01/27/2024  See note below for Objective Data and Assessment of Progress/Goals.       PCP: Carlin Gull MD  REFERRING PROVIDER: Debby Dorn MATSU, MD  REFERRING DIAG: [phone] (ICD-10-CM) - Other spondylosis with myelopathy, cervical region   THERAPY DIAG:  Cervicalgia  Other abnormalities of gait and mobility  Rationale for Evaluation and Treatment: Rehabilitation  ONSET DATE: DOS: 1/14 fusion of c3-c6  SUBJECTIVE:                                                                                                                                                                                                         SUBJECTIVE STATEMENT: The patient has been to the MD. The MD is happy with the progress in his neck. He is still having leg and balance issues. He comes in today using a walker.   Eval: Patient has long history of cervical pain.  He is found to have cervical spinal cord compression.  He has cervical fixation from C3-C6 08/25/2023.  He has increased pain since that point.  He reports pain with starts in his neck and goes down into both shoulders.  He also reports weakness in bilateral upper extremities.    Hand dominance: Right  PERTINENT HISTORY:  Anemia, acquired equinus deformity; CAD< CKD, DMII, IBS, MI, MI, neuralgia, PF, carotid stenosis Stroke,  Torn Rotator cuff; CABG; vein bypass graft 1998; Spine surgery in 2014     PAIN:  PAIN:  Are you having pain? Yes: NPRS scale: 5/10 Pain location: cervical spine into bilateral shoulders  Pain description: aching Aggravating factors: UE activity  Relieving factors:  rest  PRECAUTIONS: None  RED FLAGS: None     WEIGHT BEARING RESTRICTIONS: No  FALLS:  Has patient fallen in last 6 months? No    OCCUPATION:  Lives   PLOF: Independent  PATIENT GOALS: get pain down and get stronger  NEXT MD VISIT: 12/10/2023  OBJECTIVE:  Note: Objective measures were completed at Evaluation unless otherwise noted.  DIAGNOSTIC FINDINGS:  Surgery: CERVICAL THREE-FOUR, CERVICAL FOUR-FIVE, CERVICAL FIVE-SIX ANTERIOR CERVICAL DECOMPRESSION/DISCECTOMY FUSION   PATIENT SURVEYS:  NDI    COGNITION: Overall cognitive status: Within functional limits for tasks assessed  SENSATION: WFL  POSTURE: rounded shoulders, forward head, and flexed trunk   PALPATION: Spasming in cervical spine and into peri-scapular area  CERVICAL ROM:   Active ROM A/PROM (deg) eval AROM 6/18   Flexion 25   Extension 20   Right lateral flexion 2   Left lateral flexion 2   Right rotation 42 54  Left rotation 40 63   (Blank rows = not tested)  UPPER EXTREMITY ROM:  Active ROM Right eval Left eval  Shoulder flexion 135 120  Shoulder extension    Shoulder abduction 155 145  Shoulder adduction    Shoulder extension    Shoulder internal rotation T12 T12  Shoulder external rotation T1 Base of occiput  Elbow flexion  Elbow extension    Wrist flexion    Wrist extension    Wrist ulnar deviation    Wrist radial deviation    Wrist pronation    Wrist supination     (Blank rows = not tested)  UPPER EXTREMITY MMT:  MMT Right eval Left eval Right  6/18 Left  6/18  Shoulder flexion 4 4- 4+ 4+  Shoulder extension      Shoulder abduction      Shoulder adduction      Shoulder extension       Shoulder internal rotation      Shoulder external rotation      Middle trapezius      Lower trapezius      Elbow flexion      Elbow extension      Wrist flexion      Wrist extension      Wrist ulnar deviation      Wrist radial deviation      Wrist pronation      Wrist supination      Grip strength       (Blank rows = not tested)  Grip test:  R: 50lbs  L: 20lbs  CERVICAL SPECIAL TESTS:    FUNCTIONAL TESTS:  Feet together eyes closed: able to complete, moderate swaying Tandem eyes closed: able to complete moderate swaying  Treatment Date: 7/30 Manual:  Trigger point release to upper traps, cervical paraspinals, levator.  Neuro-re-ed:  Bilateral er yellow 3x10 with cuing for posture  Horizontal abduction 3x10 with cuing for posture yellow   Slow march with cuing for upright posture and head straight forward 3x10   Heel/toe rock with cuing for good posture 3x10   There-act:  Sit to stand 3x3 with cuing to keep head up and eyes straight forward.      7/21 Manual:  Trigger point release to upper traps, cervical paraspinals, levator. Suboccipital release  Trigger point release to mid thoracic paraspinals   There-ex:  Ball roll out for back 10x 5 sec hold forward  Lateral step 5x 5 sec hold   Seated Hip abduction 20 green  Seated March x20 Green  Seated LAQ   2x10 each leg    Neuro-re-ed  Row green 3x12 with TA breathing  Shoulder extension 3x12 with TA breathing      7/9 Manual:  Trigger point release to upper traps, cervical paraspinals, levator. Suboccipital release   There-ex:  Seated Hip abduction 20 green  Seated March x20 Green  Seated LAQ  yellow 2x10 each leg   Neuro-re-ed  Punches 3x12 3lbs  Bicep curls 3x12 3lbs  Bilateral ER red 3x12     6/25  Manual:  Trigger point release to upper traps, cervical paraspinals, levator. Suboccipital release  There-ex:  Seated Hip abduction 20 green  Seated March x20 Green  Seated LAQ   yellow 2x10 each leg    Neuro-re-ed  Punches 3x12 3lbs  Bicep curls 3x12 3lbs  Bilateral ER red 3x12   6/18 Manual:  Trigger point release to upper traps, cervical paraspinals, levator. Suboccipital release  Neuro-re-ed  Punches 3x12 3lbs  Bicep curls 3x12 3lbs  Bilateral ER red 3x12   There-ex: anterior chest wand stretch   6/11  Manual:  Trigger point release to upper traps, cervical paraspinals, levator. Suboccipital release  Neuro-re-ed  Punches 3x12 2lbs  Bicep curls 3x12 2lbs   With cuing for posture   Row 15 lbs 2x12  Shoulder extension 2x15 10 lbs  PATIENT EDUCATION:  Education details: Symptom management, HEP, self care Person educated: Patient Education method: Explanation, Demonstration, Tactile cues, Verbal cues, and Handouts Education comprehension: verbalized understanding and returned demonstration  HOME EXERCISE PROGRAM: [id number]  ASSESSMENT:  CLINICAL IMPRESSION: Therapy continues to focus on standing posture and exercises that wil promote balance at the same time. He requires cuing to stand straight and lift legs. He continues to try to get to the MD to figure out the cause for his fatigue and progressive weakness in his legs. He was encouraged to continue with the walker. The walker is a little low for him which encourages a flexed posture. He only had mild   Eval:  Patient is a 78 year old male status post C3-C6 cervical fixation on 08/24/2022.  At this point he presents with limited upper extremity strength, limited cervical mobility, and pain with functional activities.  He has decreased grip strength on the left compared to the right.  He would benefit from skilled therapy to improve ability to use arms for functional activity.  OBJECTIVE IMPAIRMENTS: Abnormal gait, decreased activity tolerance, decreased balance, decreased endurance, difficulty walking, decreased ROM, decreased strength, increased fascial restrictions, increased  muscle spasms, postural dysfunction, and pain.   ACTIVITY LIMITATIONS: carrying, lifting, sitting, standing, squatting, transfers, dressing, self feeding, reach over head, and hygiene/grooming  PARTICIPATION LIMITATIONS: meal prep, cleaning, laundry, driving, shopping, community activity, and yard work  PERSONAL FACTORS: 3+ comorbidities: multi joint OA, low back pain, right leg stent failure  are also affecting patient's functional outcome.   REHAB POTENTIAL: Good  CLINICAL DECISION MAKING: Evolving/moderate complexity  EVALUATION COMPLEXITY: Moderate   GOALS: Goals reviewed with patient? Yes  SHORT TERM GOALS: Target date: 12/29/2023    Patient will increase cervical rotation by 10 degrees  Baseline:  Goal status: has progressed but not reached yet 6/18   2.  Patient will demonstrate full flexion of bilateral shoulders  Baseline:  Goal status: improving 6/18   3.  Patient will increase grip strength by 10 lbs  Baseline:  Goal status not tested 6/18 L  LONG TERM GOALS: Target date: 01/26/2024    Patient will perform ADL's without neck pain  Baseline:  Goal status: begging to be able to 6/18  2.  Patient will demonstrate 50 degrees of cervical rotation in order to drive  Baseline:  Goal status: achieved 6/18    3.  Patient will demonstrate good functional balance in order to improve safety  Baseline:  Goal status: still limited      PLAN:  PT FREQUENCY: 1-2x/week  PT DURATION: 8 weeks  PLANNED INTERVENTIONS:   PLAN FOR NEXT SESSION: Consider trigger point release to bilateral upper trap, cervical paraspinals, and parascapular muscles.  Continue with postural exercises.   Alm JINNY Don, PT 03/09/2024, 8:50 AM

## 2024-03-15 ENCOUNTER — Encounter (HOSPITAL_BASED_OUTPATIENT_CLINIC_OR_DEPARTMENT_OTHER): Payer: Self-pay | Admitting: Physical Therapy

## 2024-03-15 ENCOUNTER — Ambulatory Visit (HOSPITAL_BASED_OUTPATIENT_CLINIC_OR_DEPARTMENT_OTHER): Attending: Neurosurgery | Admitting: Physical Therapy

## 2024-03-15 DIAGNOSIS — R2689 Other abnormalities of gait and mobility: Secondary | ICD-10-CM | POA: Insufficient documentation

## 2024-03-15 DIAGNOSIS — M542 Cervicalgia: Secondary | ICD-10-CM | POA: Insufficient documentation

## 2024-03-15 NOTE — Therapy (Signed)
 OUTPATIENT PHYSICAL THERAPY CERVICAL TREATMENT   Patient Name: Bradley [name] MRN: [id number] DOB:05-20-1946, 78 y.o., male Today's Date: 03/15/2024  END OF SESSION:  PT End of Session - 03/15/24 1317     Visit Number 9    Number of Visits 16    Date for PT Re-Evaluation 04/14/24    Authorization Type progress note done on visit 12 next to be done on 22    Progress Note Due on Visit 22    PT Start Time 1315    PT Stop Time 1353    PT Time Calculation (min) 38 min    Behavior During Therapy [facility] for tasks assessed/performed          Past Medical History:  Diagnosis Date   Acquired equinus deformity of foot 12/15/2011   Anemia    Arthritis    Carotid artery occlusion    a. Carotid US  10/16: RICA 60-79%, L CEA patent with 1-39% stenosis   CKD (chronic kidney disease)    stage 3-4   Coagulation disorder ([name]) 05/25/2019   Coronary artery disease    a. Myoview  9/16:  EF 52%, inferior fixed defect consistent with diaphragmatic attenuation, intermediate risk secondary to poor exercise tolerance and symptoms during stress;  b. LHC 05/17/2015 90% mid LAD, 80% ost D2, 75% mid RCA, 35% prox RCA. >> S/p CABG   Coronary artery disease involving coronary bypass graft of native heart with angina pectoris ([name])    CORONARY ARTERY BYPASS GRAFTING x 3 - 05/23/2015 Left internal mammary artery to left anterior descending  Saphenous vein graft to diagonal  Saphenous vein graft to posterior descending Endoscopic greater saphenous vein harvest right thigh     Diabetes mellitus age 48   Diverticulitis    Diverticulosis of colon without hemorrhage 05/17/2015   By CT scan    DM type 2, controlled, with complication ([name]) 04/29/2015   Essential hypertension 04/29/2015   GERD (gastroesophageal reflux disease)    H/O hiatal hernia    History of echocardiogram    a. Echo 9/16: GLS -15.2%, EF 55-60%, grade 1 diastolic dysfunction, normal wall motion, aortic sclerosis, dilated aortic root 39 mm, atrial  septal lipomatous hypertrophy   Hyperlipidemia    Hypertension    Irritable bowel syndrome (IBS)    Myocardial infarction ([name])    silent inferior MI; patient denies MI history (03/17/13)    Neuralgia    Neuropathy 2013   OSA on CPAP 05/30/2015   Osteoporosis 2013   PAF (paroxysmal atrial fibrillation) ([name])    post CABG 2016; Amiodarone  stopped 2/2 wheezing; PAF 01/12/20   Peripheral vascular disease ([name])    Plantar fasciitis 12/15/2011   Pneumonia    Reflux esophagitis    Right bundle branch block 04/30/2015   Sleep apnea    uses CPAP   Stenosis of right internal carotid artery with cerebral infarction ([name]) 12/09/2011   Stress fracture of metatarsal bone 02/18/2012   Stroke ([name]) 1987   Right brain stroke- slight drop on left side   Syncope 01/12/2020   Type 2 diabetes mellitus with vascular disease ([name]) 05/30/2015   Wears glasses    Past Surgical History:  Procedure Laterality Date   ABDOMINAL AORTOGRAM W/LOWER EXTREMITY Bilateral 11/27/2020   Procedure: ABDOMINAL AORTOGRAM W/LOWER EXTREMITY;  Surgeon: Serene Gaile ORN, MD;  Location: [facility] INVASIVE CV LAB;  Service: Cardiovascular;  Laterality: Bilateral;   ANTERIOR CERVICAL DECOMP/DISCECTOMY FUSION N/A 08/25/2023   Procedure: CERVICAL THREE-FOUR, CERVICAL FOUR-FIVE, CERVICAL FIVE-SIX ANTERIOR CERVICAL DECOMPRESSION/DISCECTOMY  FUSION;  Surgeon: Debby Dorn MATSU, MD;  Location: [facility] OR;  Service: Neurosurgery;  Laterality: N/A;   CARDIAC CATHETERIZATION N/A 05/17/2015   Procedure: Left Heart Cath and Coronary Angiography;  Surgeon: Victory LELON Sharps, MD;  Location: [facility] INVASIVE CV LAB;  Service: Cardiovascular;  Laterality: N/A;   CAROTID ENDARTERECTOMY  1994 & redo 2001   Left   CATARACT EXTRACTION W/ INTRAOCULAR LENS IMPLANT Bilateral    COLONOSCOPY W/ BIOPSIES AND POLYPECTOMY     CORONARY ARTERY BYPASS GRAFT N/A 05/23/2015   Procedure: CORONARY ARTERY BYPASS GRAFTING (CABG) x 3 (LIMA to LAD, SVG to DIAGONAL 2, SVG to PDA) with  Endoscopic Vein Harvesting from right greater saphenous vein;  Surgeon: Dallas KATHEE Jude, MD;  Location: [facility] OR;  Service: Open Heart Surgery;  Laterality: N/A;   ENDARTERECTOMY FEMORAL Right 11/30/2020   Procedure: ENDARTERECTOMY FEMORAL RIGHT;  Surgeon: Oris Krystal FALCON, MD;  Location: [facility] OR;  Service: Vascular;  Laterality: Right;   FEMORAL-POPLITEAL BYPASS GRAFT Right 11/30/2020   Procedure: BYPASS GRAFT FEMORAL-POPLITEAL ARTERY RIGHT;  Surgeon: Oris Krystal FALCON, MD;  Location: [facility] OR;  Service: Vascular;  Laterality: Right;   FRACTURE SURGERY  2013   Right   foo  t X's 2   ILIAC ARTERY STENT     LAPAROSCOPIC CHOLECYSTECTOMY  09/11/2016   POSTERIOR LUMBAR FUSION  03/24/2013   Level 1   PR VEIN BYPASS GRAFT,AORTO-FEM-POP  1998   SPINE SURGERY  2014   TEE WITHOUT CARDIOVERSION N/A 05/23/2015   Procedure: TRANSESOPHAGEAL ECHOCARDIOGRAM (TEE);  Surgeon: Dallas KATHEE Jude, MD;  Location: [facility] OR;  Service: Open Heart Surgery;  Laterality: N/A;   TONSILLECTOMY     TRANSCAROTID ARTERY REVASCULARIZATION  Right 06/01/2020   Procedure: RIGHT TRANSCAROTID ARTERY REVASCULARIZATION;  Surgeon: Serene Gaile LELON, MD;  Location: [facility] OR;  Service: Vascular;  Laterality: Right;   TRIGGER FINGER RELEASE Right 07/14/2017   Procedure: RIGHT LONG FINGER TRIGGER RELEASE;  Surgeon: Sebastian Lenis, MD;  Location: [facility];  Service: Orthopedics;  Laterality: Right;   Patient Active Problem List   Diagnosis Date Noted   Cervical myelopathy ([name]) 08/25/2023   Diabetic neuropathy ([name]) 06/03/2022   PAD (peripheral artery disease) ([name]) 11/30/2020   Carotid stenosis 06/01/2020   Carotid stenosis, asymptomatic 06/01/2020   Syncope 01/12/2020   Coagulation disorder ([name]) 05/25/2019   Coronary artery disease involving coronary bypass graft of native heart with angina pectoris ([name])    PAF (paroxysmal atrial fibrillation) ([name])    Type 2 diabetes mellitus with vascular disease ([name]) 05/30/2015   OSA on  CPAP 05/30/2015   Abnormal stress test 05/18/2015   Diverticulosis of colon without hemorrhage 05/17/2015   Right bundle branch block 04/30/2015   Essential hypertension 04/29/2015   Hyperlipidemia 04/29/2015   DM type 2, controlled, with complication ([name]) 04/29/2015   Peripheral vascular disease ([name]) 06/08/2012   Stress fracture of metatarsal bone 02/18/2012   Acquired equinus deformity of foot 12/15/2011   Plantar fasciitis 12/15/2011   Stenosis of right internal carotid artery with cerebral infarction ([name]) 12/09/2011      PCP: Carlin Gull MD  REFERRING PROVIDER: Debby Dorn MATSU, MD  REFERRING DIAG: [phone] (ICD-10-CM) - Other spondylosis with myelopathy, cervical region   THERAPY DIAG:  Cervicalgia  Other abnormalities of gait and mobility  Rationale for Evaluation and Treatment: Rehabilitation  ONSET DATE: DOS: 1/14 fusion of c3-c6  SUBJECTIVE:  SUBJECTIVE STATEMENT: Pt reports that he only notices the pain in his neck if he is looking to R or L when driving.   .   Eval: Patient has long history of cervical pain.  He is found to have cervical spinal cord compression.  He has cervical fixation from C3-C6 08/25/2023.  He has increased pain since that point.  He reports pain with starts in his neck and goes down into both shoulders.  He also reports weakness in bilateral upper extremities.    Hand dominance: Right  PERTINENT HISTORY:  Anemia, acquired equinus deformity; CAD< CKD, DMII, IBS, MI, MI, neuralgia, PF, carotid stenosis Stroke, Torn Rotator cuff; CABG; vein bypass graft 1998; Spine surgery in 2014     PAIN:  PAIN:  Are you having pain? Yes: NPRS scale: 1/10 Pain location: cervical spine into bilateral shoulders  Pain description: aching Aggravating  factors: UE activity  Relieving factors:  rest  PRECAUTIONS: None  RED FLAGS: None     WEIGHT BEARING RESTRICTIONS: No  FALLS:  Has patient fallen in last 6 months? No    OCCUPATION:  Lives   PLOF: Independent  PATIENT GOALS: get pain down and get stronger  NEXT MD VISIT:   OBJECTIVE:  Note: Objective measures were completed at Evaluation unless otherwise noted.  DIAGNOSTIC FINDINGS:  Surgery: CERVICAL THREE-FOUR, CERVICAL FOUR-FIVE, CERVICAL FIVE-SIX ANTERIOR CERVICAL DECOMPRESSION/DISCECTOMY FUSION   PATIENT SURVEYS:  NDI    COGNITION: Overall cognitive status: Within functional limits for tasks assessed  SENSATION: WFL  POSTURE: rounded shoulders, forward head, and flexed trunk   PALPATION: Spasming in cervical spine and into peri-scapular area  CERVICAL ROM:   Active ROM A/PROM (deg) eval AROM 6/18   Flexion 25   Extension 20   Right lateral flexion 2   Left lateral flexion 2   Right rotation 42 54  Left rotation 40 63   (Blank rows = not tested)  UPPER EXTREMITY ROM:  Active ROM Right eval Left eval  Shoulder flexion 135 120  Shoulder extension    Shoulder abduction 155 145  Shoulder adduction    Shoulder extension    Shoulder internal rotation T12 T12  Shoulder external rotation T1 Base of occiput  Elbow flexion    Elbow extension    Wrist flexion    Wrist extension    Wrist ulnar deviation    Wrist radial deviation    Wrist pronation    Wrist supination     (Blank rows = not tested)  UPPER EXTREMITY MMT:  MMT Right eval Left eval Right  6/18 Left  6/18  Shoulder flexion 4 4- 4+ 4+  Shoulder extension      Shoulder abduction      Shoulder adduction      Shoulder extension      Shoulder internal rotation      Shoulder external rotation      Middle trapezius      Lower trapezius      Elbow flexion      Elbow extension      Wrist flexion      Wrist extension      Wrist ulnar deviation      Wrist radial deviation       Wrist pronation      Wrist supination      Grip strength       (Blank rows = not tested)  Grip test:  R: 50lbs  L: 20lbs  Grip test : 03/15/24 Rt : 45#,  59# Lt: 49#, 52#  CERVICAL SPECIAL TESTS:    FUNCTIONAL TESTS:  Feet together eyes closed: able to complete, moderate swaying Tandem eyes closed: able to complete moderate swaying  Treatment Date: 03/15/24: -Seated marching with cueing for posture 3 x 10 - grip strength test (see above) -Standing UE reaching exercise, placing cones on shelves chest height to overhead height, same side and crossing midline - semi-tandem stance without UE support 20s x 2 each LE forward SBA (minor LOB, able to correct with stepping strategy )  - side stepping at counter with light UE support and increased step height with cues for neutral head - Trunk ext to neutral at sink -> back against wall shifting hips forward for hip flexor stretch - manual therapy: STM to Lt/Rt upper trap,Lt pec, and Rt/Lt periscapular musculature to decrease fascial restrictions   7/30 Manual:  Trigger point release to upper traps, cervical paraspinals, levator.  Neuro-re-ed:  Bilateral er yellow 3x10 with cuing for posture  Horizontal abduction 3x10 with cuing for posture yellow   Slow march with cuing for upright posture and head straight forward 3x10   Heel/toe rock with cuing for good posture 3x10   There-act:  Sit to stand 3x3 with cuing to keep head up and eyes straight forward.      7/21 Manual:  Trigger point release to upper traps, cervical paraspinals, levator. Suboccipital release  Trigger point release to mid thoracic paraspinals   There-ex:  Ball roll out for back 10x 5 sec hold forward  Lateral step 5x 5 sec hold   Seated Hip abduction 20 green  Seated March x20 Green  Seated LAQ   2x10 each leg    Neuro-re-ed  Row green 3x12 with TA breathing  Shoulder extension 3x12 with TA breathing      7/9 Manual:  Trigger point release  to upper traps, cervical paraspinals, levator. Suboccipital release   There-ex:  Seated Hip abduction 20 green  Seated March x20 Green  Seated LAQ  yellow 2x10 each leg   Neuro-re-ed  Punches 3x12 3lbs  Bicep curls 3x12 3lbs  Bilateral ER red 3x12     6/25  Manual:  Trigger point release to upper traps, cervical paraspinals, levator. Suboccipital release  There-ex:  Seated Hip abduction 20 green  Seated March x20 Green  Seated LAQ  yellow 2x10 each leg    Neuro-re-ed  Punches 3x12 3lbs  Bicep curls 3x12 3lbs  Bilateral ER red 3x12   6/18 Manual:  Trigger point release to upper traps, cervical paraspinals, levator. Suboccipital release  Neuro-re-ed  Punches 3x12 3lbs  Bicep curls 3x12 3lbs  Bilateral ER red 3x12   There-ex: anterior chest wand stretch   6/11  Manual:  Trigger point release to upper traps, cervical paraspinals, levator. Suboccipital release  Neuro-re-ed  Punches 3x12 2lbs  Bicep curls 3x12 2lbs   With cuing for posture   Row 15 lbs 2x12  Shoulder extension 2x15 10 lbs       PATIENT EDUCATION:  Education details: Symptom management, HEP, self care Person educated: Patient Education method: Explanation, Demonstration, Tactile cues, Verbal cues, and Handouts Education comprehension: verbalized understanding and returned demonstration  HOME EXERCISE PROGRAM: [id number]  ASSESSMENT:  CLINICAL IMPRESSION: Continued  focus on standing posture and exercises that wil promote balance at the same time. He requires frequent cues to bring head to neutral posture (vs forward head). Hip flexors appear tight as he is observed flexing through knees when attempting more vertical posture. Encouraged  pt to continue with the walker. Pt searching for taller rollator. No increase in pain, just fatigue.   Grip strength improved in Lt hand from initial eval.   Eval:  Patient is a 78 year old male status post C3-C6 cervical fixation on 08/24/2022.  At  this point he presents with limited upper extremity strength, limited cervical mobility, and pain with functional activities.  He has decreased grip strength on the left compared to the right.  He would benefit from skilled therapy to improve ability to use arms for functional activity.  OBJECTIVE IMPAIRMENTS: Abnormal gait, decreased activity tolerance, decreased balance, decreased endurance, difficulty walking, decreased ROM, decreased strength, increased fascial restrictions, increased muscle spasms, postural dysfunction, and pain.   ACTIVITY LIMITATIONS: carrying, lifting, sitting, standing, squatting, transfers, dressing, self feeding, reach over head, and hygiene/grooming  PARTICIPATION LIMITATIONS: meal prep, cleaning, laundry, driving, shopping, community activity, and yard work  PERSONAL FACTORS: 3+ comorbidities: multi joint OA, low back pain, right leg stent failure  are also affecting patient's functional outcome.   REHAB POTENTIAL: Good  CLINICAL DECISION MAKING: Evolving/moderate complexity  EVALUATION COMPLEXITY: Moderate   GOALS: Goals reviewed with patient? Yes  SHORT TERM GOALS: Target date: 12/29/2023    Patient will increase cervical rotation by 10 degrees  Baseline:  Goal status MET 6/18   2.  Patient will demonstrate full flexion of bilateral shoulders  Baseline:  Goal status: improving 6/18   3.  Patient will increase grip strength by 10 lbs  Baseline: see above Goal status:  Partially met - 03/15/24  LONG TERM GOALS: Target date: 01/26/2024    Patient will perform ADL's without neck pain  Baseline:  Goal status: In progress 8/5  2.  Patient will demonstrate 50 degrees of cervical rotation in order to drive  Baseline:  Goal status: MET  6/18    3.  Patient will demonstrate good functional balance in order to improve safety  Baseline:  Goal status: still limited      PLAN:  PT FREQUENCY: 1-2x/week  PT DURATION: 8 weeks  PLANNED  INTERVENTIONS:   PLAN FOR NEXT SESSION: Consider trigger point release to bilateral upper trap, cervical paraspinals, and parascapular muscles.  Continue with postural exercises.  Delon Aquas, PTA 03/15/24 4:46 PM [facility] [address] [place], [place], [address] Phone: [phone]   Fax:  [phone]

## 2024-03-17 DIAGNOSIS — E1142 Type 2 diabetes mellitus with diabetic polyneuropathy: Secondary | ICD-10-CM | POA: Diagnosis not present

## 2024-03-21 DIAGNOSIS — R29898 Other symptoms and signs involving the musculoskeletal system: Secondary | ICD-10-CM | POA: Diagnosis not present

## 2024-03-21 DIAGNOSIS — Z6830 Body mass index (BMI) 30.0-30.9, adult: Secondary | ICD-10-CM | POA: Diagnosis not present

## 2024-03-21 DIAGNOSIS — M4317 Spondylolisthesis, lumbosacral region: Secondary | ICD-10-CM | POA: Diagnosis not present

## 2024-03-21 DIAGNOSIS — R0982 Postnasal drip: Secondary | ICD-10-CM | POA: Diagnosis not present

## 2024-03-22 ENCOUNTER — Encounter (HOSPITAL_BASED_OUTPATIENT_CLINIC_OR_DEPARTMENT_OTHER): Payer: Self-pay | Admitting: Physical Therapy

## 2024-03-22 ENCOUNTER — Ambulatory Visit (HOSPITAL_BASED_OUTPATIENT_CLINIC_OR_DEPARTMENT_OTHER): Admitting: Physical Therapy

## 2024-03-22 DIAGNOSIS — M542 Cervicalgia: Secondary | ICD-10-CM | POA: Diagnosis not present

## 2024-03-22 DIAGNOSIS — R2689 Other abnormalities of gait and mobility: Secondary | ICD-10-CM

## 2024-03-22 NOTE — Therapy (Signed)
 OUTPATIENT PHYSICAL THERAPY CERVICAL TREATMENT   Patient Name: Bradley [name] MRN: [id number] DOB:20-Aug-1945, 78 y.o., male Today's Date: 03/23/2024  END OF SESSION:  PT End of Session - 03/22/24 1622     Visit Number 10    Number of Visits 16    Date for PT Re-Evaluation 04/14/24    Authorization Type progress note done at visit 6    PT Start Time 1600    PT Stop Time 1643    PT Time Calculation (min) 43 min    Activity Tolerance Patient tolerated treatment well;No increased pain    Behavior During Therapy [facility] for tasks assessed/performed          Past Medical History:  Diagnosis Date   Acquired equinus deformity of foot 12/15/2011   Anemia    Arthritis    Carotid artery occlusion    a. Carotid US  10/16: RICA 60-79%, L CEA patent with 1-39% stenosis   CKD (chronic kidney disease)    stage 3-4   Coagulation disorder ([name]) 05/25/2019   Coronary artery disease    a. Myoview  9/16:  EF 52%, inferior fixed defect consistent with diaphragmatic attenuation, intermediate risk secondary to poor exercise tolerance and symptoms during stress;  b. LHC 05/17/2015 90% mid LAD, 80% ost D2, 75% mid RCA, 35% prox RCA. >> S/p CABG   Coronary artery disease involving coronary bypass graft of native heart with angina pectoris ([name])    CORONARY ARTERY BYPASS GRAFTING x 3 - 05/23/2015 Left internal mammary artery to left anterior descending  Saphenous vein graft to diagonal  Saphenous vein graft to posterior descending Endoscopic greater saphenous vein harvest right thigh     Diabetes mellitus age 73   Diverticulitis    Diverticulosis of colon without hemorrhage 05/17/2015   By CT scan    DM type 2, controlled, with complication ([name]) 04/29/2015   Essential hypertension 04/29/2015   GERD (gastroesophageal reflux disease)    H/O hiatal hernia    History of echocardiogram    a. Echo 9/16: GLS -15.2%, EF 55-60%, grade 1 diastolic dysfunction, normal wall motion, aortic sclerosis, dilated aortic  root 39 mm, atrial septal lipomatous hypertrophy   Hyperlipidemia    Hypertension    Irritable bowel syndrome (IBS)    Myocardial infarction ([name])    silent inferior MI; patient denies MI history (03/17/13)    Neuralgia    Neuropathy 2013   OSA on CPAP 05/30/2015   Osteoporosis 2013   PAF (paroxysmal atrial fibrillation) ([name])    post CABG 2016; Amiodarone  stopped 2/2 wheezing; PAF 01/12/20   Peripheral vascular disease ([name])    Plantar fasciitis 12/15/2011   Pneumonia    Reflux esophagitis    Right bundle branch block 04/30/2015   Sleep apnea    uses CPAP   Stenosis of right internal carotid artery with cerebral infarction ([name]) 12/09/2011   Stress fracture of metatarsal bone 02/18/2012   Stroke ([name]) 1987   Right brain stroke- slight drop on left side   Syncope 01/12/2020   Type 2 diabetes mellitus with vascular disease ([name]) 05/30/2015   Wears glasses    Past Surgical History:  Procedure Laterality Date   ABDOMINAL AORTOGRAM W/LOWER EXTREMITY Bilateral 11/27/2020   Procedure: ABDOMINAL AORTOGRAM W/LOWER EXTREMITY;  Surgeon: Serene Gaile ORN, MD;  Location: [facility] INVASIVE CV LAB;  Service: Cardiovascular;  Laterality: Bilateral;   ANTERIOR CERVICAL DECOMP/DISCECTOMY FUSION N/A 08/25/2023   Procedure: CERVICAL THREE-FOUR, CERVICAL FOUR-FIVE, CERVICAL FIVE-SIX ANTERIOR CERVICAL DECOMPRESSION/DISCECTOMY FUSION;  Surgeon: Debby,  Dorn MATSU, MD;  Location: [facility] OR;  Service: Neurosurgery;  Laterality: N/A;   CARDIAC CATHETERIZATION N/A 05/17/2015   Procedure: Left Heart Cath and Coronary Angiography;  Surgeon: Victory LELON Sharps, MD;  Location: [facility] INVASIVE CV LAB;  Service: Cardiovascular;  Laterality: N/A;   CAROTID ENDARTERECTOMY  1994 & redo 2001   Left   CATARACT EXTRACTION W/ INTRAOCULAR LENS IMPLANT Bilateral    COLONOSCOPY W/ BIOPSIES AND POLYPECTOMY     CORONARY ARTERY BYPASS GRAFT N/A 05/23/2015   Procedure: CORONARY ARTERY BYPASS GRAFTING (CABG) x 3 (LIMA to LAD, SVG to DIAGONAL 2,  SVG to PDA) with Endoscopic Vein Harvesting from right greater saphenous vein;  Surgeon: Dallas KATHEE Jude, MD;  Location: [facility] OR;  Service: Open Heart Surgery;  Laterality: N/A;   ENDARTERECTOMY FEMORAL Right 11/30/2020   Procedure: ENDARTERECTOMY FEMORAL RIGHT;  Surgeon: Oris Krystal FALCON, MD;  Location: [facility] OR;  Service: Vascular;  Laterality: Right;   FEMORAL-POPLITEAL BYPASS GRAFT Right 11/30/2020   Procedure: BYPASS GRAFT FEMORAL-POPLITEAL ARTERY RIGHT;  Surgeon: Oris Krystal FALCON, MD;  Location: [facility] OR;  Service: Vascular;  Laterality: Right;   FRACTURE SURGERY  2013   Right   foo  t X's 2   ILIAC ARTERY STENT     LAPAROSCOPIC CHOLECYSTECTOMY  09/11/2016   POSTERIOR LUMBAR FUSION  03/24/2013   Level 1   PR VEIN BYPASS GRAFT,AORTO-FEM-POP  1998   SPINE SURGERY  2014   TEE WITHOUT CARDIOVERSION N/A 05/23/2015   Procedure: TRANSESOPHAGEAL ECHOCARDIOGRAM (TEE);  Surgeon: Dallas KATHEE Jude, MD;  Location: [facility] OR;  Service: Open Heart Surgery;  Laterality: N/A;   TONSILLECTOMY     TRANSCAROTID ARTERY REVASCULARIZATION  Right 06/01/2020   Procedure: RIGHT TRANSCAROTID ARTERY REVASCULARIZATION;  Surgeon: Serene Gaile LELON, MD;  Location: [facility] OR;  Service: Vascular;  Laterality: Right;   TRIGGER FINGER RELEASE Right 07/14/2017   Procedure: RIGHT LONG FINGER TRIGGER RELEASE;  Surgeon: Sebastian Lenis, MD;  Location: [facility];  Service: Orthopedics;  Laterality: Right;   Patient Active Problem List   Diagnosis Date Noted   Cervical myelopathy ([name]) 08/25/2023   Diabetic neuropathy ([name]) 06/03/2022   PAD (peripheral artery disease) ([name]) 11/30/2020   Carotid stenosis 06/01/2020   Carotid stenosis, asymptomatic 06/01/2020   Syncope 01/12/2020   Coagulation disorder ([name]) 05/25/2019   Coronary artery disease involving coronary bypass graft of native heart with angina pectoris ([name])    PAF (paroxysmal atrial fibrillation) ([name])    Type 2 diabetes mellitus with vascular disease ([name])  05/30/2015   OSA on CPAP 05/30/2015   Abnormal stress test 05/18/2015   Diverticulosis of colon without hemorrhage 05/17/2015   Right bundle branch block 04/30/2015   Essential hypertension 04/29/2015   Hyperlipidemia 04/29/2015   DM type 2, controlled, with complication ([name]) 04/29/2015   Peripheral vascular disease ([name]) 06/08/2012   Stress fracture of metatarsal bone 02/18/2012   Acquired equinus deformity of foot 12/15/2011   Plantar fasciitis 12/15/2011   Stenosis of right internal carotid artery with cerebral infarction ([name]) 12/09/2011      PCP: Carlin Gull MD  REFERRING PROVIDER: Debby Dorn MATSU, MD  REFERRING DIAG: [phone] (ICD-10-CM) - Other spondylosis with myelopathy, cervical region   THERAPY DIAG:  Cervicalgia  Other abnormalities of gait and mobility  Rationale for Evaluation and Treatment: Rehabilitation  ONSET DATE: DOS: 1/14 fusion of c3-c6  SUBJECTIVE:  SUBJECTIVE STATEMENT: The neck is hurting some today. He is having a progressive difficulty ambulating. He continues to use his walker.  .   Eval: Patient has long history of cervical pain.  He is found to have cervical spinal cord compression.  He has cervical fixation from C3-C6 08/25/2023.  He has increased pain since that point.  He reports pain with starts in his neck and goes down into both shoulders.  He also reports weakness in bilateral upper extremities.    Hand dominance: Right  PERTINENT HISTORY:  Anemia, acquired equinus deformity; CAD< CKD, DMII, IBS, MI, MI, neuralgia, PF, carotid stenosis Stroke, Torn Rotator cuff; CABG; vein bypass graft 1998; Spine surgery in 2014     PAIN:  PAIN:  Are you having pain? Yes: NPRS scale: 1/10 Pain location: cervical spine into bilateral shoulders  Pain  description: aching Aggravating factors: UE activity  Relieving factors:  rest  PRECAUTIONS: None  RED FLAGS: None     WEIGHT BEARING RESTRICTIONS: No  FALLS:  Has patient fallen in last 6 months? No    OCCUPATION:  Lives   PLOF: Independent  PATIENT GOALS: get pain down and get stronger  NEXT MD VISIT:   OBJECTIVE:  Note: Objective measures were completed at Evaluation unless otherwise noted.  DIAGNOSTIC FINDINGS:  Surgery: CERVICAL THREE-FOUR, CERVICAL FOUR-FIVE, CERVICAL FIVE-SIX ANTERIOR CERVICAL DECOMPRESSION/DISCECTOMY FUSION   PATIENT SURVEYS:  NDI    COGNITION: Overall cognitive status: Within functional limits for tasks assessed  SENSATION: WFL  POSTURE: rounded shoulders, forward head, and flexed trunk   PALPATION: Spasming in cervical spine and into peri-scapular area  CERVICAL ROM:   Active ROM A/PROM (deg) eval AROM 6/18   Flexion 25   Extension 20   Right lateral flexion 2   Left lateral flexion 2   Right rotation 42 54  Left rotation 40 63   (Blank rows = not tested)  UPPER EXTREMITY ROM:  Active ROM Right eval Left eval  Shoulder flexion 135 120  Shoulder extension    Shoulder abduction 155 145  Shoulder adduction    Shoulder extension    Shoulder internal rotation T12 T12  Shoulder external rotation T1 Base of occiput  Elbow flexion    Elbow extension    Wrist flexion    Wrist extension    Wrist ulnar deviation    Wrist radial deviation    Wrist pronation    Wrist supination     (Blank rows = not tested)  UPPER EXTREMITY MMT:  MMT Right eval Left eval Right  6/18 Left  6/18  Shoulder flexion 4 4- 4+ 4+  Shoulder extension      Shoulder abduction      Shoulder adduction      Shoulder extension      Shoulder internal rotation      Shoulder external rotation      Middle trapezius      Lower trapezius      Elbow flexion      Elbow extension      Wrist flexion      Wrist extension      Wrist ulnar  deviation      Wrist radial deviation      Wrist pronation      Wrist supination      Grip strength       (Blank rows = not tested)  Grip test:  R: 50lbs  L: 20lbs  Grip test : 03/15/24 Rt : 45#, 59# Lt: 49#,  52#  CERVICAL SPECIAL TESTS:    FUNCTIONAL TESTS:  Feet together eyes closed: able to complete, moderate swaying Tandem eyes closed: able to complete moderate swaying  Treatment Date: 8/13 Manual:  Trigger point release to upper traps, cervical paraspinals, levator.  neuro-re-ed:  Bilateral er yellow 3x10 with cuing for posture  Horizontal abduction 3x10 with cuing for posture yellow  Wand flexion with cuing for posture 3x10    03/15/24: -Seated marching with cueing for posture 3 x 10 - grip strength test (see above) -Standing UE reaching exercise, placing cones on shelves chest height to overhead height, same side and crossing midline - semi-tandem stance without UE support 20s x 2 each LE forward SBA (minor LOB, able to correct with stepping strategy )  - side stepping at counter with light UE support and increased step height with cues for neutral head - Trunk ext to neutral at sink -> back against wall shifting hips forward for hip flexor stretch - manual therapy: STM to Lt/Rt upper trap,Lt pec, and Rt/Lt periscapular musculature to decrease fascial restrictions   7/30 Manual:  Trigger point release to upper traps, cervical paraspinals, levator.  Neuro-re-ed:  Bilateral er yellow 3x10 with cuing for posture  Horizontal abduction 3x10 with cuing for posture yellow   Slow march with cuing for upright posture and head straight forward 3x10   Heel/toe rock with cuing for good posture 3x10   There-act:  Sit to stand 3x3 with cuing to keep head up and eyes straight forward.      7/21 Manual:  Trigger point release to upper traps, cervical paraspinals, levator. Suboccipital release  Trigger point release to mid thoracic paraspinals   There-ex:  Ball  roll out for back 10x 5 sec hold forward  Lateral step 5x 5 sec hold   Seated Hip abduction 20 green  Seated March x20 Green  Seated LAQ   2x10 each leg    Neuro-re-ed  Row green 3x12 with TA breathing  Shoulder extension 3x12 with TA breathing      7/9 Manual:  Trigger point release to upper traps, cervical paraspinals, levator. Suboccipital release   There-ex:  Seated Hip abduction 20 green  Seated March x20 Green  Seated LAQ  yellow 2x10 each leg   Neuro-re-ed  Punches 3x12 3lbs  Bicep curls 3x12 3lbs  Bilateral ER red 3x12     6/25  Manual:  Trigger point release to upper traps, cervical paraspinals, levator. Suboccipital release  There-ex:  Seated Hip abduction 20 green  Seated March x20 Green  Seated LAQ  yellow 2x10 each leg    Neuro-re-ed  Punches 3x12 3lbs  Bicep curls 3x12 3lbs  Bilateral ER red 3x12   6/18 Manual:  Trigger point release to upper traps, cervical paraspinals, levator. Suboccipital release  Neuro-re-ed  Punches 3x12 3lbs  Bicep curls 3x12 3lbs  Bilateral ER red 3x12   There-ex: anterior chest wand stretch   6/11  Manual:  Trigger point release to upper traps, cervical paraspinals, levator. Suboccipital release  Neuro-re-ed  Punches 3x12 2lbs  Bicep curls 3x12 2lbs   With cuing for posture   Row 15 lbs 2x12  Shoulder extension 2x15 10 lbs       PATIENT EDUCATION:  Education details: Symptom management, HEP, self care Person educated: Patient Education method: Explanation, Demonstration, Tactile cues, Verbal cues, and Handouts Education comprehension: verbalized understanding and returned demonstration  HOME EXERCISE PROGRAM: [id number]  ASSESSMENT:  CLINICAL IMPRESSION: The patient recently been to the  gym and had soreness in both legs.  Therapy worked on seated postural exercises for his neck.  He reported some fatigue with exercises.  He continues to have mild spasming in his neck.  Overall his neck is  progressing well.  Therapy will continue to progress general postural exercises and overall strength and stability for safety.  Expressed interest in pool therapy.  He was advised he will need a prescription for pool therapy.  He may contact this MD. Eval:  Patient is a 78 year old male status post C3-C6 cervical fixation on 08/24/2022.  At this point he presents with limited upper extremity strength, limited cervical mobility, and pain with functional activities.  He has decreased grip strength on the left compared to the right.  He would benefit from skilled therapy to improve ability to use arms for functional activity.  OBJECTIVE IMPAIRMENTS: Abnormal gait, decreased activity tolerance, decreased balance, decreased endurance, difficulty walking, decreased ROM, decreased strength, increased fascial restrictions, increased muscle spasms, postural dysfunction, and pain.   ACTIVITY LIMITATIONS: carrying, lifting, sitting, standing, squatting, transfers, dressing, self feeding, reach over head, and hygiene/grooming  PARTICIPATION LIMITATIONS: meal prep, cleaning, laundry, driving, shopping, community activity, and yard work  PERSONAL FACTORS: 3+ comorbidities: multi joint OA, low back pain, right leg stent failure  are also affecting patient's functional outcome.   REHAB POTENTIAL: Good  CLINICAL DECISION MAKING: Evolving/moderate complexity  EVALUATION COMPLEXITY: Moderate   GOALS: Goals reviewed with patient? Yes  SHORT TERM GOALS: Target date: 12/29/2023    Patient will increase cervical rotation by 10 degrees  Baseline:  Goal status MET 6/18   2.  Patient will demonstrate full flexion of bilateral shoulders  Baseline:  Goal status: improving 6/18   3.  Patient will increase grip strength by 10 lbs  Baseline: see above Goal status:  Partially met - 03/15/24  LONG TERM GOALS: Target date: 01/26/2024    Patient will perform ADL's without neck pain  Baseline:  Goal status: In  progress 8/5  2.  Patient will demonstrate 50 degrees of cervical rotation in order to drive  Baseline:  Goal status: MET  6/18    3.  Patient will demonstrate good functional balance in order to improve safety  Baseline:  Goal status: still limited      PLAN:  PT FREQUENCY: 1-2x/week  PT DURATION: 8 weeks  PLANNED INTERVENTIONS:   PLAN FOR NEXT SESSION: Consider trigger point release to bilateral upper trap, cervical paraspinals, and parascapular muscles.  Continue with postural exercises.  Alm Don PT DPT 03/23/24 2:16 PM [facility] [address] [place], [place], [address] Phone: [phone]   Fax:  [phone]

## 2024-03-28 ENCOUNTER — Encounter (HOSPITAL_BASED_OUTPATIENT_CLINIC_OR_DEPARTMENT_OTHER): Payer: Self-pay

## 2024-03-28 ENCOUNTER — Ambulatory Visit (HOSPITAL_BASED_OUTPATIENT_CLINIC_OR_DEPARTMENT_OTHER)

## 2024-03-28 DIAGNOSIS — R2689 Other abnormalities of gait and mobility: Secondary | ICD-10-CM

## 2024-03-28 DIAGNOSIS — M542 Cervicalgia: Secondary | ICD-10-CM

## 2024-03-28 NOTE — Therapy (Signed)
 OUTPATIENT PHYSICAL THERAPY CERVICAL TREATMENT   Patient Name: Bradley [name] MRN: [id number] DOB:14-Dec-1945, 78 y.o., male Today's Date: 03/28/2024  END OF SESSION:  PT End of Session - 03/28/24 1552     Visit Number 11    Number of Visits 16    Date for PT Re-Evaluation 04/14/24    Authorization Type progress note done at visit 6    Progress Note Due on Visit 22    PT Start Time 1515    PT Stop Time 1600    PT Time Calculation (min) 45 min    Activity Tolerance Patient tolerated treatment well    Behavior During Therapy [facility] for tasks assessed/performed           Past Medical History:  Diagnosis Date   Acquired equinus deformity of foot 12/15/2011   Anemia    Arthritis    Carotid artery occlusion    a. Carotid US  10/16: RICA 60-79%, L CEA patent with 1-39% stenosis   CKD (chronic kidney disease)    stage 3-4   Coagulation disorder ([name]) 05/25/2019   Coronary artery disease    a. Myoview  9/16:  EF 52%, inferior fixed defect consistent with diaphragmatic attenuation, intermediate risk secondary to poor exercise tolerance and symptoms during stress;  b. LHC 05/17/2015 90% mid LAD, 80% ost D2, 75% mid RCA, 35% prox RCA. >> S/p CABG   Coronary artery disease involving coronary bypass graft of native heart with angina pectoris ([name])    CORONARY ARTERY BYPASS GRAFTING x 3 - 05/23/2015 Left internal mammary artery to left anterior descending  Saphenous vein graft to diagonal  Saphenous vein graft to posterior descending Endoscopic greater saphenous vein harvest right thigh     Diabetes mellitus age 57   Diverticulitis    Diverticulosis of colon without hemorrhage 05/17/2015   By CT scan    DM type 2, controlled, with complication ([name]) 04/29/2015   Essential hypertension 04/29/2015   GERD (gastroesophageal reflux disease)    H/O hiatal hernia    History of echocardiogram    a. Echo 9/16: GLS -15.2%, EF 55-60%, grade 1 diastolic dysfunction, normal wall motion, aortic  sclerosis, dilated aortic root 39 mm, atrial septal lipomatous hypertrophy   Hyperlipidemia    Hypertension    Irritable bowel syndrome (IBS)    Myocardial infarction ([name])    silent inferior MI; patient denies MI history (03/17/13)    Neuralgia    Neuropathy 2013   OSA on CPAP 05/30/2015   Osteoporosis 2013   PAF (paroxysmal atrial fibrillation) ([name])    post CABG 2016; Amiodarone  stopped 2/2 wheezing; PAF 01/12/20   Peripheral vascular disease ([name])    Plantar fasciitis 12/15/2011   Pneumonia    Reflux esophagitis    Right bundle branch block 04/30/2015   Sleep apnea    uses CPAP   Stenosis of right internal carotid artery with cerebral infarction ([name]) 12/09/2011   Stress fracture of metatarsal bone 02/18/2012   Stroke ([name]) 1987   Right brain stroke- slight drop on left side   Syncope 01/12/2020   Type 2 diabetes mellitus with vascular disease ([name]) 05/30/2015   Wears glasses    Past Surgical History:  Procedure Laterality Date   ABDOMINAL AORTOGRAM W/LOWER EXTREMITY Bilateral 11/27/2020   Procedure: ABDOMINAL AORTOGRAM W/LOWER EXTREMITY;  Surgeon: Serene Gaile ORN, MD;  Location: [facility] INVASIVE CV LAB;  Service: Cardiovascular;  Laterality: Bilateral;   ANTERIOR CERVICAL DECOMP/DISCECTOMY FUSION N/A 08/25/2023   Procedure: CERVICAL THREE-FOUR, CERVICAL FOUR-FIVE, CERVICAL  FIVE-SIX ANTERIOR CERVICAL DECOMPRESSION/DISCECTOMY FUSION;  Surgeon: Debby Dorn MATSU, MD;  Location: [facility] OR;  Service: Neurosurgery;  Laterality: N/A;   CARDIAC CATHETERIZATION N/A 05/17/2015   Procedure: Left Heart Cath and Coronary Angiography;  Surgeon: Victory LELON Sharps, MD;  Location: [facility] INVASIVE CV LAB;  Service: Cardiovascular;  Laterality: N/A;   CAROTID ENDARTERECTOMY  1994 & redo 2001   Left   CATARACT EXTRACTION W/ INTRAOCULAR LENS IMPLANT Bilateral    COLONOSCOPY W/ BIOPSIES AND POLYPECTOMY     CORONARY ARTERY BYPASS GRAFT N/A 05/23/2015   Procedure: CORONARY ARTERY BYPASS GRAFTING (CABG) x 3 (LIMA  to LAD, SVG to DIAGONAL 2, SVG to PDA) with Endoscopic Vein Harvesting from right greater saphenous vein;  Surgeon: [place] KATHEE Jude, MD;  Location: [facility] OR;  Service: Open Heart Surgery;  Laterality: N/A;   ENDARTERECTOMY FEMORAL Right 11/30/2020   Procedure: ENDARTERECTOMY FEMORAL RIGHT;  Surgeon: Oris Krystal FALCON, MD;  Location: [facility] OR;  Service: Vascular;  Laterality: Right;   FEMORAL-POPLITEAL BYPASS GRAFT Right 11/30/2020   Procedure: BYPASS GRAFT FEMORAL-POPLITEAL ARTERY RIGHT;  Surgeon: Oris Krystal FALCON, MD;  Location: [facility] OR;  Service: Vascular;  Laterality: Right;   FRACTURE SURGERY  2013   Right   foo  t X's 2   ILIAC ARTERY STENT     LAPAROSCOPIC CHOLECYSTECTOMY  09/11/2016   POSTERIOR LUMBAR FUSION  03/24/2013   Level 1   PR VEIN BYPASS GRAFT,AORTO-FEM-POP  1998   SPINE SURGERY  2014   TEE WITHOUT CARDIOVERSION N/A 05/23/2015   Procedure: TRANSESOPHAGEAL ECHOCARDIOGRAM (TEE);  Surgeon: [place] KATHEE Jude, MD;  Location: [facility] OR;  Service: Open Heart Surgery;  Laterality: N/A;   TONSILLECTOMY     TRANSCAROTID ARTERY REVASCULARIZATION  Right 06/01/2020   Procedure: RIGHT TRANSCAROTID ARTERY REVASCULARIZATION;  Surgeon: Serene Gaile LELON, MD;  Location: [facility] OR;  Service: Vascular;  Laterality: Right;   TRIGGER FINGER RELEASE Right 07/14/2017   Procedure: RIGHT LONG FINGER TRIGGER RELEASE;  Surgeon: Sebastian Lenis, MD;  Location: [facility];  Service: Orthopedics;  Laterality: Right;   Patient Active Problem List   Diagnosis Date Noted   Cervical myelopathy ([name]) 08/25/2023   Diabetic neuropathy ([name]) 06/03/2022   PAD (peripheral artery disease) ([name]) 11/30/2020   Carotid stenosis 06/01/2020   Carotid stenosis, asymptomatic 06/01/2020   Syncope 01/12/2020   Coagulation disorder ([name]) 05/25/2019   Coronary artery disease involving coronary bypass graft of native heart with angina pectoris ([name])    PAF (paroxysmal atrial fibrillation) ([name])    Type 2 diabetes mellitus with  vascular disease ([name]) 05/30/2015   OSA on CPAP 05/30/2015   Abnormal stress test 05/18/2015   Diverticulosis of colon without hemorrhage 05/17/2015   Right bundle branch block 04/30/2015   Essential hypertension 04/29/2015   Hyperlipidemia 04/29/2015   DM type 2, controlled, with complication ([name]) 04/29/2015   Peripheral vascular disease ([name]) 06/08/2012   Stress fracture of metatarsal bone 02/18/2012   Acquired equinus deformity of foot 12/15/2011   Plantar fasciitis 12/15/2011   Stenosis of right internal carotid artery with cerebral infarction ([name]) 12/09/2011      PCP: Carlin Gull MD  REFERRING PROVIDER: Debby Dorn MATSU, MD  REFERRING DIAG: [phone] (ICD-10-CM) - Other spondylosis with myelopathy, cervical region   THERAPY DIAG:  Cervicalgia  Other abnormalities of gait and mobility  Rationale for Evaluation and Treatment: Rehabilitation  ONSET DATE: DOS: 1/14 fusion of c3-c6  SUBJECTIVE:  SUBJECTIVE STATEMENT: Pt reports his neck is feeling more sore than usual, unsure why. More pain with turning head to the right.   Eval: Patient has long history of cervical pain.  He is found to have cervical spinal cord compression.  He has cervical fixation from C3-C6 08/25/2023.  He has increased pain since that point.  He reports pain with starts in his neck and goes down into both shoulders.  He also reports weakness in bilateral upper extremities.    Hand dominance: Right  PERTINENT HISTORY:  Anemia, acquired equinus deformity; CAD< CKD, DMII, IBS, MI, MI, neuralgia, PF, carotid stenosis Stroke, Torn Rotator cuff; CABG; vein bypass graft 1998; Spine surgery in 2014     PAIN:  PAIN:  Are you having pain? Yes: NPRS scale: 5/10 with rotation Pain location: cervical spine into  bilateral shoulders  Pain description: aching Aggravating factors: UE activity  Relieving factors:  rest  PRECAUTIONS: None  RED FLAGS: None     WEIGHT BEARING RESTRICTIONS: No  FALLS:  Has patient fallen in last 6 months? No    OCCUPATION:  Lives   PLOF: Independent  PATIENT GOALS: get pain down and get stronger  NEXT MD VISIT:   OBJECTIVE:  Note: Objective measures were completed at Evaluation unless otherwise noted.  DIAGNOSTIC FINDINGS:  Surgery: CERVICAL THREE-FOUR, CERVICAL FOUR-FIVE, CERVICAL FIVE-SIX ANTERIOR CERVICAL DECOMPRESSION/DISCECTOMY FUSION   PATIENT SURVEYS:  NDI    COGNITION: Overall cognitive status: Within functional limits for tasks assessed  SENSATION: WFL  POSTURE: rounded shoulders, forward head, and flexed trunk   PALPATION: Spasming in cervical spine and into peri-scapular area  CERVICAL ROM:   Active ROM A/PROM (deg) eval AROM 6/18   Flexion 25   Extension 20   Right lateral flexion 2   Left lateral flexion 2   Right rotation 42 54  Left rotation 40 63   (Blank rows = not tested)  UPPER EXTREMITY ROM:  Active ROM Right eval Left eval  Shoulder flexion 135 120  Shoulder extension    Shoulder abduction 155 145  Shoulder adduction    Shoulder extension    Shoulder internal rotation T12 T12  Shoulder external rotation T1 Base of occiput  Elbow flexion    Elbow extension    Wrist flexion    Wrist extension    Wrist ulnar deviation    Wrist radial deviation    Wrist pronation    Wrist supination     (Blank rows = not tested)  UPPER EXTREMITY MMT:  MMT Right eval Left eval Right  6/18 Left  6/18  Shoulder flexion 4 4- 4+ 4+  Shoulder extension      Shoulder abduction      Shoulder adduction      Shoulder extension      Shoulder internal rotation      Shoulder external rotation      Middle trapezius      Lower trapezius      Elbow flexion      Elbow extension      Wrist flexion      Wrist extension       Wrist ulnar deviation      Wrist radial deviation      Wrist pronation      Wrist supination      Grip strength       (Blank rows = not tested)  Grip test:  R: 50lbs  L: 20lbs  Grip test : 03/15/24 Rt : 45#, 59# Lt:  49#, 52#  CERVICAL SPECIAL TESTS:    FUNCTIONAL TESTS:  Feet together eyes closed: able to complete, moderate swaying Tandem eyes closed: able to complete moderate swaying  Treatment Date:  8/18 Manual:  Trigger point release to upper traps, cervical paraspinals, levator Supine cervical PROM STM to scalenes, cervical ps Suboccipital release Cervical AROM seated  Wand flexion with cuing for posture x10 1# bar seated Seated scap retraction (cues for correct performance)  Posterior shoulder rolls (attempted, but switched to shrugs instead) x10 Postural awareness education    8/13 Manual:  Trigger point release to upper traps, cervical paraspinals, levator.  neuro-re-ed:  Bilateral er yellow 3x10 with cuing for posture  Horizontal abduction 3x10 with cuing for posture yellow  Wand flexion with cuing for posture 3x10    03/15/24: -Seated marching with cueing for posture 3 x 10 - grip strength test (see above) -Standing UE reaching exercise, placing cones on shelves chest height to overhead height, same side and crossing midline - semi-tandem stance without UE support 20s x 2 each LE forward SBA (minor LOB, able to correct with stepping strategy )  - side stepping at counter with light UE support and increased step height with cues for neutral head - Trunk ext to neutral at sink -> back against wall shifting hips forward for hip flexor stretch - manual therapy: STM to Lt/Rt upper trap,Lt pec, and Rt/Lt periscapular musculature to decrease fascial restrictions   7/30 Manual:  Trigger point release to upper traps, cervical paraspinals, levator.  Neuro-re-ed:  Bilateral er yellow 3x10 with cuing for posture  Horizontal abduction 3x10 with cuing  for posture yellow   Slow march with cuing for upright posture and head straight forward 3x10   Heel/toe rock with cuing for good posture 3x10   There-act:  Sit to stand 3x3 with cuing to keep head up and eyes straight forward.     PATIENT EDUCATION:  Education details: Symptom management, HEP, self care Person educated: Patient Education method: Explanation, Demonstration, Tactile cues, Verbal cues, and Handouts Education comprehension: verbalized understanding and returned demonstration  HOME EXERCISE PROGRAM: [id number]  ASSESSMENT:  CLINICAL IMPRESSION: Continued to work on manual techniques to improve cervical ROM and decrease pan level. Following supine STM and PROM, he reported improved movement into cervical rotation and decreased pain. Worked on postural re-ed with which pt requires many cues to perform tasks correctly. Poor kinesthetic awareness of postural mm resulting in poor NMC. Worked on postural awareness and alignment. Significant cuing required with chin tuck exercise for posterior cervical strengthening. Will monitor response to PT next session and progress as tolerated.  Eval:  Patient is a 78 year old male status post C3-C6 cervical fixation on 08/24/2022.  At this point he presents with limited upper extremity strength, limited cervical mobility, and pain with functional activities.  He has decreased grip strength on the left compared to the right.  He would benefit from skilled therapy to improve ability to use arms for functional activity.  OBJECTIVE IMPAIRMENTS: Abnormal gait, decreased activity tolerance, decreased balance, decreased endurance, difficulty walking, decreased ROM, decreased strength, increased fascial restrictions, increased muscle spasms, postural dysfunction, and pain.   ACTIVITY LIMITATIONS: carrying, lifting, sitting, standing, squatting, transfers, dressing, self feeding, reach over head, and hygiene/grooming  PARTICIPATION LIMITATIONS: meal  prep, cleaning, laundry, driving, shopping, community activity, and yard work  PERSONAL FACTORS: 3+ comorbidities: multi joint OA, low back pain, right leg stent failure  are also affecting patient's functional outcome.   REHAB POTENTIAL: Good  CLINICAL DECISION MAKING: Evolving/moderate complexity  EVALUATION COMPLEXITY: Moderate   GOALS: Goals reviewed with patient? Yes  SHORT TERM GOALS: Target date: 12/29/2023    Patient will increase cervical rotation by 10 degrees  Baseline:  Goal status MET 6/18   2.  Patient will demonstrate full flexion of bilateral shoulders  Baseline:  Goal status: improving 6/18   3.  Patient will increase grip strength by 10 lbs  Baseline: see above Goal status:  Partially met - 03/15/24  LONG TERM GOALS: Target date: 01/26/2024    Patient will perform ADL's without neck pain  Baseline:  Goal status: In progress 8/5  2.  Patient will demonstrate 50 degrees of cervical rotation in order to drive  Baseline:  Goal status: MET  6/18    3.  Patient will demonstrate good functional balance in order to improve safety  Baseline:  Goal status: still limited      PLAN:  PT FREQUENCY: 1-2x/week  PT DURATION: 8 weeks  PLANNED INTERVENTIONS:   PLAN FOR NEXT SESSION: Consider trigger point release to bilateral upper trap, cervical paraspinals, and parascapular muscles.  Continue with postural exercises.  Asberry Rodes, PTA  03/28/24 5:06 PM [facility] [address] [place], [place], [address] Phone: [phone]   Fax:  [phone]

## 2024-04-04 ENCOUNTER — Encounter (HOSPITAL_BASED_OUTPATIENT_CLINIC_OR_DEPARTMENT_OTHER): Admitting: Physical Therapy

## 2024-04-05 ENCOUNTER — Ambulatory Visit (HOSPITAL_BASED_OUTPATIENT_CLINIC_OR_DEPARTMENT_OTHER)

## 2024-04-05 ENCOUNTER — Encounter (HOSPITAL_BASED_OUTPATIENT_CLINIC_OR_DEPARTMENT_OTHER): Payer: Self-pay

## 2024-04-05 DIAGNOSIS — M542 Cervicalgia: Secondary | ICD-10-CM

## 2024-04-05 DIAGNOSIS — R2689 Other abnormalities of gait and mobility: Secondary | ICD-10-CM

## 2024-04-05 NOTE — Therapy (Addendum)
 OUTPATIENT PHYSICAL THERAPY CERVICAL TREATMENT   Patient Name: Bradley [name] MRN: [id number] DOB:07/11/1946, 78 y.o., male Today's Date: 04/05/2024  END OF SESSION:  PT End of Session - 04/05/24 1610     Visit Number 12    Number of Visits 16    Date for PT Re-Evaluation 04/14/24    Authorization Type progress note done at visit 6    Progress Note Due on Visit 22    PT Start Time 1434    PT Stop Time 1515    PT Time Calculation (min) 41 min    Activity Tolerance Patient tolerated treatment well    Behavior During Therapy [facility] for tasks assessed/performed            Past Medical History:  Diagnosis Date   Acquired equinus deformity of foot 12/15/2011   Anemia    Arthritis    Carotid artery occlusion    a. Carotid US  10/16: RICA 60-79%, L CEA patent with 1-39% stenosis   CKD (chronic kidney disease)    stage 3-4   Coagulation disorder ([name]) 05/25/2019   Coronary artery disease    a. Myoview  9/16:  EF 52%, inferior fixed defect consistent with diaphragmatic attenuation, intermediate risk secondary to poor exercise tolerance and symptoms during stress;  b. LHC 05/17/2015 90% mid LAD, 80% ost D2, 75% mid RCA, 35% prox RCA. >> S/p CABG   Coronary artery disease involving coronary bypass graft of native heart with angina pectoris ([name])    CORONARY ARTERY BYPASS GRAFTING x 3 - 05/23/2015 Left internal mammary artery to left anterior descending  Saphenous vein graft to diagonal  Saphenous vein graft to posterior descending Endoscopic greater saphenous vein harvest right thigh     Diabetes mellitus age 35   Diverticulitis    Diverticulosis of colon without hemorrhage 05/17/2015   By CT scan    DM type 2, controlled, with complication ([name]) 04/29/2015   Essential hypertension 04/29/2015   GERD (gastroesophageal reflux disease)    H/O hiatal hernia    History of echocardiogram    a. Echo 9/16: GLS -15.2%, EF 55-60%, grade 1 diastolic dysfunction, normal wall motion, aortic  sclerosis, dilated aortic root 39 mm, atrial septal lipomatous hypertrophy   Hyperlipidemia    Hypertension    Irritable bowel syndrome (IBS)    Myocardial infarction ([name])    silent inferior MI; patient denies MI history (03/17/13)    Neuralgia    Neuropathy 2013   OSA on CPAP 05/30/2015   Osteoporosis 2013   PAF (paroxysmal atrial fibrillation) ([name])    post CABG 2016; Amiodarone  stopped 2/2 wheezing; PAF 01/12/20   Peripheral vascular disease ([name])    Plantar fasciitis 12/15/2011   Pneumonia    Reflux esophagitis    Right bundle branch block 04/30/2015   Sleep apnea    uses CPAP   Stenosis of right internal carotid artery with cerebral infarction ([name]) 12/09/2011   Stress fracture of metatarsal bone 02/18/2012   Stroke ([name]) 1987   Right brain stroke- slight drop on left side   Syncope 01/12/2020   Type 2 diabetes mellitus with vascular disease ([name]) 05/30/2015   Wears glasses    Past Surgical History:  Procedure Laterality Date   ABDOMINAL AORTOGRAM W/LOWER EXTREMITY Bilateral 11/27/2020   Procedure: ABDOMINAL AORTOGRAM W/LOWER EXTREMITY;  Surgeon: Serene Gaile ORN, MD;  Location: [facility] INVASIVE CV LAB;  Service: Cardiovascular;  Laterality: Bilateral;   ANTERIOR CERVICAL DECOMP/DISCECTOMY FUSION N/A 08/25/2023   Procedure: CERVICAL THREE-FOUR, CERVICAL FOUR-FIVE,  CERVICAL FIVE-SIX ANTERIOR CERVICAL DECOMPRESSION/DISCECTOMY FUSION;  Surgeon: Debby Dorn MATSU, MD;  Location: [facility] OR;  Service: Neurosurgery;  Laterality: N/A;   CARDIAC CATHETERIZATION N/A 05/17/2015   Procedure: Left Heart Cath and Coronary Angiography;  Surgeon: Victory LELON Sharps, MD;  Location: [facility] INVASIVE CV LAB;  Service: Cardiovascular;  Laterality: N/A;   CAROTID ENDARTERECTOMY  1994 & redo 2001   Left   CATARACT EXTRACTION W/ INTRAOCULAR LENS IMPLANT Bilateral    COLONOSCOPY W/ BIOPSIES AND POLYPECTOMY     CORONARY ARTERY BYPASS GRAFT N/A 05/23/2015   Procedure: CORONARY ARTERY BYPASS GRAFTING (CABG) x 3 (LIMA  to LAD, SVG to DIAGONAL 2, SVG to PDA) with Endoscopic Vein Harvesting from right greater saphenous vein;  Surgeon: Dallas KATHEE Jude, MD;  Location: [facility] OR;  Service: Open Heart Surgery;  Laterality: N/A;   ENDARTERECTOMY FEMORAL Right 11/30/2020   Procedure: ENDARTERECTOMY FEMORAL RIGHT;  Surgeon: Oris Krystal FALCON, MD;  Location: [facility] OR;  Service: Vascular;  Laterality: Right;   FEMORAL-POPLITEAL BYPASS GRAFT Right 11/30/2020   Procedure: BYPASS GRAFT FEMORAL-POPLITEAL ARTERY RIGHT;  Surgeon: Oris Krystal FALCON, MD;  Location: [facility] OR;  Service: Vascular;  Laterality: Right;   FRACTURE SURGERY  2013   Right   foo  t X's 2   ILIAC ARTERY STENT     LAPAROSCOPIC CHOLECYSTECTOMY  09/11/2016   POSTERIOR LUMBAR FUSION  03/24/2013   Level 1   PR VEIN BYPASS GRAFT,AORTO-FEM-POP  1998   SPINE SURGERY  2014   TEE WITHOUT CARDIOVERSION N/A 05/23/2015   Procedure: TRANSESOPHAGEAL ECHOCARDIOGRAM (TEE);  Surgeon: Dallas KATHEE Jude, MD;  Location: [facility] OR;  Service: Open Heart Surgery;  Laterality: N/A;   TONSILLECTOMY     TRANSCAROTID ARTERY REVASCULARIZATION  Right 06/01/2020   Procedure: RIGHT TRANSCAROTID ARTERY REVASCULARIZATION;  Surgeon: Serene Gaile LELON, MD;  Location: [facility] OR;  Service: Vascular;  Laterality: Right;   TRIGGER FINGER RELEASE Right 07/14/2017   Procedure: RIGHT LONG FINGER TRIGGER RELEASE;  Surgeon: Sebastian Lenis, MD;  Location: [facility];  Service: Orthopedics;  Laterality: Right;   Patient Active Problem List   Diagnosis Date Noted   Cervical myelopathy ([name]) 08/25/2023   Diabetic neuropathy ([name]) 06/03/2022   PAD (peripheral artery disease) ([name]) 11/30/2020   Carotid stenosis 06/01/2020   Carotid stenosis, asymptomatic 06/01/2020   Syncope 01/12/2020   Coagulation disorder ([name]) 05/25/2019   Coronary artery disease involving coronary bypass graft of native heart with angina pectoris ([name])    PAF (paroxysmal atrial fibrillation) ([name])    Type 2 diabetes mellitus with  vascular disease ([name]) 05/30/2015   OSA on CPAP 05/30/2015   Abnormal stress test 05/18/2015   Diverticulosis of colon without hemorrhage 05/17/2015   Right bundle branch block 04/30/2015   Essential hypertension 04/29/2015   Hyperlipidemia 04/29/2015   DM type 2, controlled, with complication ([name]) 04/29/2015   Peripheral vascular disease ([name]) 06/08/2012   Stress fracture of metatarsal bone 02/18/2012   Acquired equinus deformity of foot 12/15/2011   Plantar fasciitis 12/15/2011   Stenosis of right internal carotid artery with cerebral infarction ([name]) 12/09/2011      PCP: Carlin Gull MD  REFERRING PROVIDER: Debby Dorn MATSU, MD  REFERRING DIAG: [phone] (ICD-10-CM) - Other spondylosis with myelopathy, cervical region   THERAPY DIAG:  Cervicalgia  Other abnormalities of gait and mobility  Rationale for Evaluation and Treatment: Rehabilitation  ONSET DATE: DOS: 1/14 fusion of c3-c6  SUBJECTIVE:  SUBJECTIVE STATEMENT: Pt reports he had no neck pain until yesterday. Had low blood sugar this morning and almost cancelled PT appt.   Eval: Patient has long history of cervical pain.  He is found to have cervical spinal cord compression.  He has cervical fixation from C3-C6 08/25/2023.  He has increased pain since that point.  He reports pain with starts in his neck and goes down into both shoulders.  He also reports weakness in bilateral upper extremities.    Hand dominance: Right  PERTINENT HISTORY:  Anemia, acquired equinus deformity; CAD< CKD, DMII, IBS, MI, MI, neuralgia, PF, carotid stenosis Stroke, Torn Rotator cuff; CABG; vein bypass graft 1998; Spine surgery in 2014     PAIN:  PAIN:  Are you having pain? Yes: NPRS scale: 5/10 with rotation Pain location: cervical spine  into bilateral shoulders  Pain description: aching Aggravating factors: UE activity  Relieving factors:  rest  PRECAUTIONS: None  RED FLAGS: None     WEIGHT BEARING RESTRICTIONS: No  FALLS:  Has patient fallen in last 6 months? No    OCCUPATION:  Lives   PLOF: Independent  PATIENT GOALS: get pain down and get stronger  NEXT MD VISIT:   OBJECTIVE:  Note: Objective measures were completed at Evaluation unless otherwise noted.  DIAGNOSTIC FINDINGS:  Surgery: CERVICAL THREE-FOUR, CERVICAL FOUR-FIVE, CERVICAL FIVE-SIX ANTERIOR CERVICAL DECOMPRESSION/DISCECTOMY FUSION   PATIENT SURVEYS:  NDI    COGNITION: Overall cognitive status: Within functional limits for tasks assessed  SENSATION: WFL  POSTURE: rounded shoulders, forward head, and flexed trunk   PALPATION: Spasming in cervical spine and into peri-scapular area  CERVICAL ROM:   Active ROM A/PROM (deg) eval AROM 6/18   Flexion 25   Extension 20   Right lateral flexion 2   Left lateral flexion 2   Right rotation 42 54  Left rotation 40 63   (Blank rows = not tested)  UPPER EXTREMITY ROM:  Active ROM Right eval Left eval  Shoulder flexion 135 120  Shoulder extension    Shoulder abduction 155 145  Shoulder adduction    Shoulder extension    Shoulder internal rotation T12 T12  Shoulder external rotation T1 Base of occiput  Elbow flexion    Elbow extension    Wrist flexion    Wrist extension    Wrist ulnar deviation    Wrist radial deviation    Wrist pronation    Wrist supination     (Blank rows = not tested)  UPPER EXTREMITY MMT:  MMT Right eval Left eval Right  6/18 Left  6/18  Shoulder flexion 4 4- 4+ 4+  Shoulder extension      Shoulder abduction      Shoulder adduction      Shoulder extension      Shoulder internal rotation      Shoulder external rotation      Middle trapezius      Lower trapezius      Elbow flexion      Elbow extension      Wrist flexion      Wrist  extension      Wrist ulnar deviation      Wrist radial deviation      Wrist pronation      Wrist supination      Grip strength       (Blank rows = not tested)  Grip test:  R: 50lbs  L: 20lbs  Grip test : 03/15/24 Rt : 45#, 59# Lt:  49#, 52#  CERVICAL SPECIAL TESTS:    FUNCTIONAL TESTS:  Feet together eyes closed: able to complete, moderate swaying Tandem eyes closed: able to complete moderate swaying  Treatment Date:  8/26 Manual:  Trigger point release to upper traps, cervical paraspinals, levator Supine cervical PROM STM to scalenes, cervical ps Suboccipital release Cervical AROM seated  Instruction in theracane Wand flexion with cuing for posture x10 1# bar seated Seated scap retraction (cues for correct performance)    8/18 Manual:  Trigger point release to upper traps, cervical paraspinals, levator Supine cervical PROM STM to scalenes, cervical ps Suboccipital release Cervical AROM seated  Wand flexion with cuing for posture x10 1# bar seated Seated scap retraction (cues for correct performance)  Posterior shoulder rolls (attempted, but switched to shrugs instead) x10 Postural awareness education    8/13 Manual:  Trigger point release to upper traps, cervical paraspinals, levator.  neuro-re-ed:  Bilateral er yellow 3x10 with cuing for posture  Horizontal abduction 3x10 with cuing for posture yellow  Wand flexion with cuing for posture 3x10    03/15/24: -Seated marching with cueing for posture 3 x 10 - grip strength test (see above) -Standing UE reaching exercise, placing cones on shelves chest height to overhead height, same side and crossing midline - semi-tandem stance without UE support 20s x 2 each LE forward SBA (minor LOB, able to correct with stepping strategy )  - side stepping at counter with light UE support and increased step height with cues for neutral head - Trunk ext to neutral at sink -> back against wall shifting hips forward for  hip flexor stretch - manual therapy: STM to Lt/Rt upper trap,Lt pec, and Rt/Lt periscapular musculature to decrease fascial restrictions   7/30 Manual:  Trigger point release to upper traps, cervical paraspinals, levator.  Neuro-re-ed:  Bilateral er yellow 3x10 with cuing for posture  Horizontal abduction 3x10 with cuing for posture yellow   Slow march with cuing for upright posture and head straight forward 3x10   Heel/toe rock with cuing for good posture 3x10   There-act:  Sit to stand 3x3 with cuing to keep head up and eyes straight forward.     PATIENT EDUCATION:  Education details: Symptom management, HEP, self care Person educated: Patient Education method: Explanation, Demonstration, Tactile cues, Verbal cues, and Handouts Education comprehension: verbalized understanding and returned demonstration  HOME EXERCISE PROGRAM: [id number]  ASSESSMENT:  CLINICAL IMPRESSION:  Continued to address restrictions in cervical musculature with pt reporting improved movement at end of session. Worked on postural re-ed with emphasis on both stability as well as utilizing improved ROM following MT. Pt to have re-assessment next visit.   Eval:  Patient is a 78 year old male status post C3-C6 cervical fixation on 08/24/2022.  At this point he presents with limited upper extremity strength, limited cervical mobility, and pain with functional activities.  He has decreased grip strength on the left compared to the right.  He would benefit from skilled therapy to improve ability to use arms for functional activity.  OBJECTIVE IMPAIRMENTS: Abnormal gait, decreased activity tolerance, decreased balance, decreased endurance, difficulty walking, decreased ROM, decreased strength, increased fascial restrictions, increased muscle spasms, postural dysfunction, and pain.   ACTIVITY LIMITATIONS: carrying, lifting, sitting, standing, squatting, transfers, dressing, self feeding, reach over head, and  hygiene/grooming  PARTICIPATION LIMITATIONS: meal prep, cleaning, laundry, driving, shopping, community activity, and yard work  PERSONAL FACTORS: 3+ comorbidities: multi joint OA, low back pain, right leg stent failure  are also affecting  patient's functional outcome.   REHAB POTENTIAL: Good  CLINICAL DECISION MAKING: Evolving/moderate complexity  EVALUATION COMPLEXITY: Moderate   GOALS: Goals reviewed with patient? Yes  SHORT TERM GOALS: Target date: 12/29/2023    Patient will increase cervical rotation by 10 degrees  Baseline:  Goal status MET 6/18   2.  Patient will demonstrate full flexion of bilateral shoulders  Baseline:  Goal status: improving 6/18   3.  Patient will increase grip strength by 10 lbs  Baseline: see above Goal status:  Partially met - 03/15/24  LONG TERM GOALS: Target date: 01/26/2024    Patient will perform ADL's without neck pain  Baseline:  Goal status: In progress 8/5  2.  Patient will demonstrate 50 degrees of cervical rotation in order to drive  Baseline:  Goal status: MET  6/18    3.  Patient will demonstrate good functional balance in order to improve safety  Baseline:  Goal status: still limited      PLAN:  PT FREQUENCY: 1-2x/week  PT DURATION: 8 weeks  PLANNED INTERVENTIONS:   PLAN FOR NEXT SESSION: Consider trigger point release to bilateral upper trap, cervical paraspinals, and parascapular muscles.  Continue with postural exercises.  Asberry Rodes, PTA  04/05/24 4:56 PM [facility] [address] [place], [place], [address] Phone: [phone]   Fax:  [phone]

## 2024-04-07 ENCOUNTER — Encounter: Payer: Self-pay | Admitting: Diagnostic Neuroimaging

## 2024-04-07 ENCOUNTER — Ambulatory Visit (INDEPENDENT_AMBULATORY_CARE_PROVIDER_SITE_OTHER): Admitting: Diagnostic Neuroimaging

## 2024-04-07 VITALS — BP 128/64 | HR 66 | Ht 71.0 in | Wt 199.8 lb

## 2024-04-07 DIAGNOSIS — R269 Unspecified abnormalities of gait and mobility: Secondary | ICD-10-CM

## 2024-04-07 DIAGNOSIS — G959 Disease of spinal cord, unspecified: Secondary | ICD-10-CM | POA: Diagnosis not present

## 2024-04-07 NOTE — Progress Notes (Signed)
 [facility]  PATIENT: Bradley [name] DOB: 1946-01-14  REFERRING CLINICIAN: Okey Carlin Redbird, MD HISTORY FROM: patient  REASON FOR VISIT: follow up   HISTORICAL  CHIEF COMPLAINT:  Chief Complaint  Patient presents with   Gait Problem    RM 6, Pt alone, referred by PCP for gait problems. Pt states has pain in his lower back and left hip. Pt has not see ortho. Pt had neck surgery in Jan 2025    HISTORY OF PRESENT ILLNESS:   UPDATE (04/07/24, VRP): Since last visit, had ACDF C3-C6 in Jan 2025. Since then continued mild progression. Here for revisit and check up.  Still some low back pain issues.   PRIOR HPI (04/09/22, VRP): 78 year old male here for evaluation of gait and balance difficulty.  Has had progressive gait and balance difficulty for 6 to 12 months.  When he stands up feels increasing pain in his back and legs, legs gave out and shake.  Symptoms worsening over time.  Having more falls.  History of diabetes for past 40 years.  Has had diabetic neuropathy for at least 8 to 10 years.  History of neck pain, low back pain, peripheral artery disease, arthritis in hips, arthritis in knees.  History of stroke in the 1990s with aphasia and left-sided weakness.  Fortunately he had a good recovery afterwards.  Has had some pain in the right supraorbital lesion, with sharp shooting pains lasting few seconds at a time.  Symptoms have subsided recently.   REVIEW OF SYSTEMS: Full 14 system review of systems performed and negative with exception of: as per HPI.  ALLERGIES: Allergies  Allergen Reactions   Doxazosin Mesylate     Other reaction(s): Diarrhea, dizziness   Hydrocodone Nausea Only   Niacin     Other reaction(s): Skin irritation   Oxycodone  Nausea Only   Penicillins Rash    Has patient had a PCN reaction causing immediate rash, facial/tongue/throat swelling, SOB or lightheadedness with hypotension: NO Has patient had a PCN reaction causing severe  rash involving mucus membranes or skin necrosis:NO Has patient had a PCN reaction that required hospitalization NO Has patient had a PCN reaction occurring within the last 10 years: NO If all of the above answers are NO, then may proceed with Cephalosporin use.    Sulfa Drugs Cross Reactors Rash    HOME MEDICATIONS: Outpatient Medications Prior to Visit  Medication Sig Dispense Refill   amLODipine  (NORVASC ) 10 MG tablet Take 1 tablet (10 mg total) by mouth daily. 90 tablet 3   apixaban  (ELIQUIS ) 5 MG TABS tablet Take 1 tablet (5 mg total) by mouth 2 (two) times daily. 180 tablet 1   aspirin  EC 81 MG tablet Take 81 mg by mouth daily. Swallow whole.     atorvastatin  (LIPITOR) 40 MG tablet Take 40 mg by mouth daily.     Cholecalciferol  (VITAMIN D3) 125 MCG (5000 UT) TABS Take 5,000 Units by mouth daily.      dexlansoprazole (DEXILANT) 60 MG capsule Take 60 mg by mouth daily.      Dulaglutide  (TRULICITY ) 3 MG/0.5ML SOPN Inject 3 mg into the skin every Sunday.     famotidine  (PEPCID ) 20 MG tablet Take 20 mg by mouth at bedtime.     gabapentin  (NEURONTIN ) 300 MG capsule Take 600 mg by mouth at bedtime.      gentamicin  ointment (GARAMYCIN ) 0.1 % Apply 1 Application topically daily. Apply to wound daily 30 g 0   HUMALOG  KWIKPEN 100 UNIT/ML  KwikPen Inject 25-30 Units into the skin 3 (three) times daily.     hydrALAZINE  (APRESOLINE ) 25 MG tablet Take 1 tablet (25 mg total) by mouth in the morning and at bedtime. 180 tablet 2   hydrocortisone  (ANUSOL -HC) 2.5 % rectal cream Place 1 application rectally daily as needed for hemorrhoids.      ketoconazole  (NIZORAL ) 2 % shampoo Apply 1 application topically daily as needed for irritation.      Melatonin 5 MG CHEW Chew 10 mg by mouth at bedtime.     methocarbamol  (ROBAXIN ) 500 MG tablet Take 500 mg by mouth every 6 (six) hours as needed for muscle spasms.      metoprolol  tartrate (LOPRESSOR ) 50 MG tablet TAKE 1 AND 1/2 TABLETS BY MOUTH TWICE DAILY 270  tablet 0   nitroGLYCERIN  (NITROSTAT ) 0.3 MG SL tablet Place 1 tablet (0.3 mg total) under the tongue every 5 (five) minutes as needed for chest pain. Only as needed 90 tablet 0   testosterone  cypionate (DEPOTESTOSTERONE CYPIONATE) 200 MG/ML injection Inject 200 mg into the muscle once a week.     torsemide  (DEMADEX ) 20 MG tablet Take 1 tablet (20 mg total) by mouth daily. 90 tablet 3   traMADol  (ULTRAM ) 50 MG tablet Take 1-2 tablets (50-100 mg total) by mouth every 6 (six) hours as needed for moderate pain (pain score 4-6) or severe pain (pain score 7-10). 30 tablet 0   TRESIBA  FLEXTOUCH 100 UNIT/ML SOPN FlexTouch Pen Inject 25 Units into the skin daily.     No facility-administered medications prior to visit.    PAST MEDICAL HISTORY: Past Medical History:  Diagnosis Date   Acquired equinus deformity of foot 12/15/2011   Anemia    Arthritis    Carotid artery occlusion    a. Carotid US  10/16: RICA 60-79%, L CEA patent with 1-39% stenosis   CKD (chronic kidney disease)    stage 3-4   Coagulation disorder ([name]) 05/25/2019   Coronary artery disease    a. Myoview  9/16:  EF 52%, inferior fixed defect consistent with diaphragmatic attenuation, intermediate risk secondary to poor exercise tolerance and symptoms during stress;  b. LHC 05/17/2015 90% mid LAD, 80% ost D2, 75% mid RCA, 35% prox RCA. >> S/p CABG   Coronary artery disease involving coronary bypass graft of native heart with angina pectoris ([name])    CORONARY ARTERY BYPASS GRAFTING x 3 - 05/23/2015 Left internal mammary artery to left anterior descending  Saphenous vein graft to diagonal  Saphenous vein graft to posterior descending Endoscopic greater saphenous vein harvest right thigh     Developmental delay    Diabetes mellitus age 50   Diverticulitis    Diverticulosis of colon without hemorrhage 05/17/2015   By CT scan    DM type 2, controlled, with complication ([name]) 04/29/2015   Essential hypertension 04/29/2015   GERD  (gastroesophageal reflux disease)    H/O hiatal hernia    History of echocardiogram    a. Echo 9/16: GLS -15.2%, EF 55-60%, grade 1 diastolic dysfunction, normal wall motion, aortic sclerosis, dilated aortic root 39 mm, atrial septal lipomatous hypertrophy   Hyperlipidemia    Hypertension    Irritable bowel syndrome (IBS)    Myocardial infarction ([name])    silent inferior MI; patient denies MI history (03/17/13)    Neuralgia    Neuropathy 2013   OSA on CPAP 05/30/2015   Osteoporosis 2013   PAF (paroxysmal atrial fibrillation) ([name])    post CABG 2016; Amiodarone  stopped 2/2 wheezing;  PAF 01/12/20   Peripheral vascular disease ([name])    Plantar fasciitis 12/15/2011   Pneumonia    Reflux esophagitis    Right bundle branch block 04/30/2015   Sleep apnea    uses CPAP   Stenosis of right internal carotid artery with cerebral infarction ([name]) 12/09/2011   Stress fracture of metatarsal bone 02/18/2012   Stroke ([name]) 1987   Right brain stroke- slight drop on left side   Syncope 01/12/2020   Type 2 diabetes mellitus with vascular disease ([name]) 05/30/2015   Wears glasses     PAST SURGICAL HISTORY: Past Surgical History:  Procedure Laterality Date   ABDOMINAL AORTOGRAM W/LOWER EXTREMITY Bilateral 11/27/2020   Procedure: ABDOMINAL AORTOGRAM W/LOWER EXTREMITY;  Surgeon: Serene Gaile ORN, MD;  Location: [facility] INVASIVE CV LAB;  Service: Cardiovascular;  Laterality: Bilateral;   ANTERIOR CERVICAL DECOMP/DISCECTOMY FUSION N/A 08/25/2023   Procedure: CERVICAL THREE-FOUR, CERVICAL FOUR-FIVE, CERVICAL FIVE-SIX ANTERIOR CERVICAL DECOMPRESSION/DISCECTOMY FUSION;  Surgeon: Debby Dorn MATSU, MD;  Location: [facility] OR;  Service: Neurosurgery;  Laterality: N/A;   CARDIAC CATHETERIZATION N/A 05/17/2015   Procedure: Left Heart Cath and Coronary Angiography;  Surgeon: Victory ORN Sharps, MD;  Location: [facility] INVASIVE CV LAB;  Service: Cardiovascular;  Laterality: N/A;   CAROTID ENDARTERECTOMY  1994 & redo 2001   Left    CATARACT EXTRACTION W/ INTRAOCULAR LENS IMPLANT Bilateral    COLONOSCOPY W/ BIOPSIES AND POLYPECTOMY     CORONARY ARTERY BYPASS GRAFT N/A 05/23/2015   Procedure: CORONARY ARTERY BYPASS GRAFTING (CABG) x 3 (LIMA to LAD, SVG to DIAGONAL 2, SVG to PDA) with Endoscopic Vein Harvesting from right greater saphenous vein;  Surgeon: Dallas KATHEE Jude, MD;  Location: [facility] OR;  Service: Open Heart Surgery;  Laterality: N/A;   ENDARTERECTOMY FEMORAL Right 11/30/2020   Procedure: ENDARTERECTOMY FEMORAL RIGHT;  Surgeon: Oris Krystal FALCON, MD;  Location: [facility] OR;  Service: Vascular;  Laterality: Right;   FEMORAL-POPLITEAL BYPASS GRAFT Right 11/30/2020   Procedure: BYPASS GRAFT FEMORAL-POPLITEAL ARTERY RIGHT;  Surgeon: Oris Krystal FALCON, MD;  Location: [facility] OR;  Service: Vascular;  Laterality: Right;   FRACTURE SURGERY  2013   Right   foo  t X's 2   ILIAC ARTERY STENT     LAPAROSCOPIC CHOLECYSTECTOMY  09/11/2016   POSTERIOR LUMBAR FUSION  03/24/2013   Level 1   PR VEIN BYPASS GRAFT,AORTO-FEM-POP  1998   SPINE SURGERY  2014   TEE WITHOUT CARDIOVERSION N/A 05/23/2015   Procedure: TRANSESOPHAGEAL ECHOCARDIOGRAM (TEE);  Surgeon: Dallas KATHEE Jude, MD;  Location: [facility] OR;  Service: Open Heart Surgery;  Laterality: N/A;   TONSILLECTOMY     TRANSCAROTID ARTERY REVASCULARIZATION  Right 06/01/2020   Procedure: RIGHT TRANSCAROTID ARTERY REVASCULARIZATION;  Surgeon: Serene Gaile ORN, MD;  Location: [facility] OR;  Service: Vascular;  Laterality: Right;   TRIGGER FINGER RELEASE Right 07/14/2017   Procedure: RIGHT LONG FINGER TRIGGER RELEASE;  Surgeon: Sebastian Lenis, MD;  Location: [facility];  Service: Orthopedics;  Laterality: Right;    FAMILY HISTORY: Family History  Problem Relation Age of Onset   Cancer Mother 78       pancreatic   Hyperlipidemia Mother    Stroke Father 52   Deep vein thrombosis Father    Hyperlipidemia Father    Hypertension Father     SOCIAL HISTORY: Social History   Socioeconomic  History   Marital status: Married    Spouse name: Not on file   Number of children: 2   Years of education: Not on  file   Highest education level: Not on file  Occupational History   Not on file  Tobacco Use   Smoking status: Former    Current packs/day: 0.00    Average packs/day: 2.0 packs/day for 50.0 years (100.0 ttl pk-yrs)    Types: Cigarettes    Start date: 03/27/1959    Quit date: 03/26/2009    Years since quitting: 15.0   Smokeless tobacco: Never  Vaping Use   Vaping status: Never Used  Substance and Sexual Activity   Alcohol use: No   Drug use: No   Sexual activity: Yes  Other Topics Concern   Not on file  Social History Narrative   Lives with wife   Caffeine coffee 3 c daily, diet coke   Social Drivers of Corporate investment banker Strain: Not on file  Food Insecurity: Not on file  Transportation Needs: Not on file  Physical Activity: Not on file  Stress: Not on file  Social Connections: Not on file  Intimate Partner Violence: Not on file     PHYSICAL EXAM  GENERAL EXAM/CONSTITUTIONAL: Vitals:  Vitals:   04/07/24 1606  BP: 128/64  Pulse: 66  Weight: 199 lb 12.8 oz (90.6 kg)  Height: 5' 11 (1.803 m)   Body mass index is 27.87 kg/m. Wt Readings from Last 3 Encounters:  04/07/24 199 lb 12.8 oz (90.6 kg)  12/22/23 210 lb 4.8 oz (95.4 kg)  12/10/23 208 lb (94.3 kg)   Patient is in no distress; well developed, nourished and groomed; neck is supple  CARDIOVASCULAR: Examination of carotid arteries is normal; no carotid bruits Regular rate and rhythm, no murmurs Examination of peripheral vascular system by observation and palpation is normal  EYES: Ophthalmoscopic exam of optic discs and posterior segments is normal; no papilledema or hemorrhages No results found.  MUSCULOSKELETAL: Gait, strength, tone, movements noted in Neurologic exam below  NEUROLOGIC: MENTAL STATUS:      No data to display         awake, alert, oriented to person,  place and time recent and remote memory intact normal attention and concentration language fluent, comprehension intact, naming intact fund of knowledge appropriate  CRANIAL NERVE:  2nd - no papilledema on fundoscopic exam 2nd, 3rd, 4th, 6th - pupils equal and reactive to light, visual fields full to confrontation, extraocular muscles intact, no nystagmus 5th - facial sensation symmetric 7th - facial strength symmetric 8th - hearing intact 9th - palate elevates symmetrically, uvula midline 11th - shoulder shrug symmetric 12th - tongue protrusion midline  MOTOR:  normal bulk and tone, full strength in the BUE, BLE; EXCEPT WEAKNESS IN R > L HIP FLEXION (4) AND LEFT DF (4) MILD INCREASED TONE IN BLE   SENSORY:  normal and symmetric to light touch, temperature, vibration; ABSENT IN FEET, ANKLES  COORDINATION:  finger-nose-finger, fine finger movements normal  REFLEXES:  deep tendon reflexes BRISK IN LUE; TRACE IN BLE and symmetric  GAIT/STATION:  narrow based gait; SLIGHTLY STIFF GAIT; USING 3 WHEEL WALKER     DIAGNOSTIC DATA (LABS, IMAGING, TESTING) - I reviewed patient records, labs, notes, testing and imaging myself where available.  Lab Results  Component Value Date   WBC 7.2 12/01/2020   HGB 12.1 (L) 12/01/2020   HCT 38.4 (L) 12/01/2020   MCV 97.5 12/01/2020   PLT 156 12/01/2020      Component Value Date/Time   NA 132 (L) 12/02/2020 0101   NA 134 02/01/2020 1219   K 3.8 12/02/2020  0101   CL 101 12/02/2020 0101   CO2 24 12/02/2020 0101   GLUCOSE 161 (H) 12/02/2020 0101   BUN 12 12/02/2020 0101   BUN 21 02/01/2020 1219   CREATININE 0.81 12/02/2020 0101   CREATININE 1.76 (H) 09/19/2015 1111   CALCIUM  8.9 12/02/2020 0101   PROT 6.5 11/30/2020 0647   ALBUMIN  3.6 11/30/2020 0647   AST 24 11/30/2020 0647   ALT 22 11/30/2020 0647   ALKPHOS 51 11/30/2020 0647   BILITOT 0.7 11/30/2020 0647   GFRNONAA >60 12/02/2020 0101   GFRAA 41 (L) 02/01/2020 1219   Lab  Results  Component Value Date   CHOL 88 12/01/2020   HDL 24 (L) 12/01/2020   LDLCALC 31 12/01/2020   TRIG 163 (H) 12/01/2020   CHOLHDL 3.7 12/01/2020   Lab Results  Component Value Date   HGBA1C 7.5 (H) 11/30/2020   No results found for: VITAMINB12 Lab Results  Component Value Date   TSH 2.630 05/26/2015     02/04/22 CT head  - No acute intracranial findings are seen in the noncontrast CT brain. Atrophy. Old infarct is seen in the right parietal cortex with no significant change.  04/20/22 MRI cervical spine (without) demonstrating: - At C3-4 disc bulging and uncovertebral joint hypertrophy with moderate spinal stenosis and severe bilateral foraminal stenosis; subtle myelomalacia noted within the spinal cord on the left side. - At C4-5 disc bulging and uncovertebral joint hypertrophy with moderate to severe spinal stenosis and moderate bilateral foraminal stenosis. - At C5-6 disc bulging and facet hypertrophy with moderate spinal stenosis and severe bilateral foraminal stenosis.  04/20/22 MRI lumbar spine without contrast demonstrating: - At L5-S1: Anterior spondylolisthesis of L5 on S1 resulting in severe bilateral foraminal stenosis; posterior lumbar interbody fusion and metal hardware as above. - At L2-3: Disc bulging with mild spinal stenosis and mild bilateral foraminal stenosis. - At L4-5: Disc bulging with mild bilateral foraminal stenosis.   ASSESSMENT AND PLAN  77 y.o. year old male here with:   Dx:  1. Gait difficulty   2. Cervical myelopathy ([name])     PLAN:  GAIT DIFFICULTY (mainly related to residual cervical myelopathy s/p ACDF C3-C6 in Jan 2025; also with some L5-S1 radiculopathies + diabetic neuropathy, hip arthritis, knee arthritis, peripheral arterial disease) - continue PT exercises - continue walker and fall precautions  Orders Placed This Encounter  Procedures   Ambulatory referral to Physical Therapy   Return for pending if symptoms worsen  or fail to improve, return to PCP.    EDUARD FABIENE HANLON, MD 04/07/2024, 4:52 PM Certified in Neurology, Neurophysiology and Neuroimaging  [facility] [address], [address] [place], [place] [address] [phone]

## 2024-04-07 NOTE — Patient Instructions (Signed)
  GAIT DIFFICULTY (mainly related to residual cervical myelopathy s/p ACDF C3-C6 in Jan 2025; also with some L5-S1 radiculopathies + diabetic neuropathy, hip arthritis, knee arthritis, peripheral arterial disease) - continue PT exercises - continue walker and fall precautions

## 2024-04-13 ENCOUNTER — Ambulatory Visit (HOSPITAL_BASED_OUTPATIENT_CLINIC_OR_DEPARTMENT_OTHER): Attending: Neurosurgery | Admitting: Physical Therapy

## 2024-04-13 ENCOUNTER — Encounter (HOSPITAL_BASED_OUTPATIENT_CLINIC_OR_DEPARTMENT_OTHER): Payer: Self-pay | Admitting: Physical Therapy

## 2024-04-13 DIAGNOSIS — M542 Cervicalgia: Secondary | ICD-10-CM | POA: Diagnosis not present

## 2024-04-13 DIAGNOSIS — M5459 Other low back pain: Secondary | ICD-10-CM | POA: Diagnosis not present

## 2024-04-13 DIAGNOSIS — R269 Unspecified abnormalities of gait and mobility: Secondary | ICD-10-CM | POA: Diagnosis not present

## 2024-04-13 DIAGNOSIS — R2689 Other abnormalities of gait and mobility: Secondary | ICD-10-CM | POA: Diagnosis not present

## 2024-04-13 DIAGNOSIS — R262 Difficulty in walking, not elsewhere classified: Secondary | ICD-10-CM | POA: Insufficient documentation

## 2024-04-13 NOTE — Therapy (Unsigned)
 OUTPATIENT PHYSICAL THERAPY  Re-eval: Gait and balance    Patient Name: Bradley [name] MRN: [id number] DOB:1945-10-10, 78 y.o., male Today's Date: 04/14/2024  END OF SESSION:  PT End of Session - 04/13/24 1359     Visit Number 13    Number of Visits 29    Date for PT Re-Evaluation 06/09/24    Authorization Type Re eval done at visit 13    PT Start Time 1345    PT Stop Time 1428    PT Time Calculation (min) 43 min    Activity Tolerance Patient tolerated treatment well    Behavior During Therapy [facility] for tasks assessed/performed            Past Medical History:  Diagnosis Date   Acquired equinus deformity of foot 12/15/2011   Anemia    Arthritis    Carotid artery occlusion    a. Carotid US  10/16: RICA 60-79%, L CEA patent with 1-39% stenosis   CKD (chronic kidney disease)    stage 3-4   Coagulation disorder ([name]) 05/25/2019   Coronary artery disease    a. Myoview  9/16:  EF 52%, inferior fixed defect consistent with diaphragmatic attenuation, intermediate risk secondary to poor exercise tolerance and symptoms during stress;  b. LHC 05/17/2015 90% mid LAD, 80% ost D2, 75% mid RCA, 35% prox RCA. >> S/p CABG   Coronary artery disease involving coronary bypass graft of native heart with angina pectoris ([name])    CORONARY ARTERY BYPASS GRAFTING x 3 - 05/23/2015 Left internal mammary artery to left anterior descending  Saphenous vein graft to diagonal  Saphenous vein graft to posterior descending Endoscopic greater saphenous vein harvest right thigh     Developmental delay    Diabetes mellitus age 2   Diverticulitis    Diverticulosis of colon without hemorrhage 05/17/2015   By CT scan    DM type 2, controlled, with complication ([name]) 04/29/2015   Essential hypertension 04/29/2015   GERD (gastroesophageal reflux disease)    H/O hiatal hernia    History of echocardiogram    a. Echo 9/16: GLS -15.2%, EF 55-60%, grade 1 diastolic dysfunction, normal wall motion, aortic sclerosis,  dilated aortic root 39 mm, atrial septal lipomatous hypertrophy   Hyperlipidemia    Hypertension    Irritable bowel syndrome (IBS)    Myocardial infarction ([name])    silent inferior MI; patient denies MI history (03/17/13)    Neuralgia    Neuropathy 2013   OSA on CPAP 05/30/2015   Osteoporosis 2013   PAF (paroxysmal atrial fibrillation) ([name])    post CABG 2016; Amiodarone  stopped 2/2 wheezing; PAF 01/12/20   Peripheral vascular disease ([name])    Plantar fasciitis 12/15/2011   Pneumonia    Reflux esophagitis    Right bundle branch block 04/30/2015   Sleep apnea    uses CPAP   Stenosis of right internal carotid artery with cerebral infarction ([name]) 12/09/2011   Stress fracture of metatarsal bone 02/18/2012   Stroke ([name]) 1987   Right brain stroke- slight drop on left side   Syncope 01/12/2020   Type 2 diabetes mellitus with vascular disease ([name]) 05/30/2015   Wears glasses    Past Surgical History:  Procedure Laterality Date   ABDOMINAL AORTOGRAM W/LOWER EXTREMITY Bilateral 11/27/2020   Procedure: ABDOMINAL AORTOGRAM W/LOWER EXTREMITY;  Surgeon: Serene Gaile ORN, MD;  Location: [facility] INVASIVE CV LAB;  Service: Cardiovascular;  Laterality: Bilateral;   ANTERIOR CERVICAL DECOMP/DISCECTOMY FUSION N/A 08/25/2023   Procedure: CERVICAL THREE-FOUR, CERVICAL FOUR-FIVE,  CERVICAL FIVE-SIX ANTERIOR CERVICAL DECOMPRESSION/DISCECTOMY FUSION;  Surgeon: Debby Dorn MATSU, MD;  Location: [facility] OR;  Service: Neurosurgery;  Laterality: N/A;   CARDIAC CATHETERIZATION N/A 05/17/2015   Procedure: Left Heart Cath and Coronary Angiography;  Surgeon: Victory LELON Sharps, MD;  Location: [facility] INVASIVE CV LAB;  Service: Cardiovascular;  Laterality: N/A;   CAROTID ENDARTERECTOMY  1994 & redo 2001   Left   CATARACT EXTRACTION W/ INTRAOCULAR LENS IMPLANT Bilateral    COLONOSCOPY W/ BIOPSIES AND POLYPECTOMY     CORONARY ARTERY BYPASS GRAFT N/A 05/23/2015   Procedure: CORONARY ARTERY BYPASS GRAFTING (CABG) x 3 (LIMA to LAD, SVG  to DIAGONAL 2, SVG to PDA) with Endoscopic Vein Harvesting from right greater saphenous vein;  Surgeon: Dallas KATHEE Jude, MD;  Location: [facility] OR;  Service: Open Heart Surgery;  Laterality: N/A;   ENDARTERECTOMY FEMORAL Right 11/30/2020   Procedure: ENDARTERECTOMY FEMORAL RIGHT;  Surgeon: Oris Krystal FALCON, MD;  Location: [facility] OR;  Service: Vascular;  Laterality: Right;   FEMORAL-POPLITEAL BYPASS GRAFT Right 11/30/2020   Procedure: BYPASS GRAFT FEMORAL-POPLITEAL ARTERY RIGHT;  Surgeon: Oris Krystal FALCON, MD;  Location: [facility] OR;  Service: Vascular;  Laterality: Right;   FRACTURE SURGERY  2013   Right   foo  t X's 2   ILIAC ARTERY STENT     LAPAROSCOPIC CHOLECYSTECTOMY  09/11/2016   POSTERIOR LUMBAR FUSION  03/24/2013   Level 1   PR VEIN BYPASS GRAFT,AORTO-FEM-POP  1998   SPINE SURGERY  2014   TEE WITHOUT CARDIOVERSION N/A 05/23/2015   Procedure: TRANSESOPHAGEAL ECHOCARDIOGRAM (TEE);  Surgeon: Dallas KATHEE Jude, MD;  Location: [facility] OR;  Service: Open Heart Surgery;  Laterality: N/A;   TONSILLECTOMY     TRANSCAROTID ARTERY REVASCULARIZATION  Right 06/01/2020   Procedure: RIGHT TRANSCAROTID ARTERY REVASCULARIZATION;  Surgeon: Serene Gaile LELON, MD;  Location: [facility] OR;  Service: Vascular;  Laterality: Right;   TRIGGER FINGER RELEASE Right 07/14/2017   Procedure: RIGHT LONG FINGER TRIGGER RELEASE;  Surgeon: Sebastian Lenis, MD;  Location: [facility];  Service: Orthopedics;  Laterality: Right;   Patient Active Problem List   Diagnosis Date Noted   Cervical myelopathy ([name]) 08/25/2023   Diabetic neuropathy ([name]) 06/03/2022   PAD (peripheral artery disease) ([name]) 11/30/2020   Carotid stenosis 06/01/2020   Carotid stenosis, asymptomatic 06/01/2020   Syncope 01/12/2020   Coagulation disorder ([name]) 05/25/2019   Coronary artery disease involving coronary bypass graft of native heart with angina pectoris ([name])    PAF (paroxysmal atrial fibrillation) ([name])    Type 2 diabetes mellitus with vascular  disease ([name]) 05/30/2015   OSA on CPAP 05/30/2015   Abnormal stress test 05/18/2015   Diverticulosis of colon without hemorrhage 05/17/2015   Right bundle branch block 04/30/2015   Essential hypertension 04/29/2015   Hyperlipidemia 04/29/2015   DM type 2, controlled, with complication ([name]) 04/29/2015   Peripheral vascular disease ([name]) 06/08/2012   Stress fracture of metatarsal bone 02/18/2012   Acquired equinus deformity of foot 12/15/2011   Plantar fasciitis 12/15/2011   Stenosis of right internal carotid artery with cerebral infarction ([name]) 12/09/2011      PCP: Carlin Gull MD  REFERRING PROVIDER: Debby Dorn MATSU, MD  REFERRING DIAG: [id number] (ICD-10-CM) - Other spondylosis with myelopathy, cervical region   THERAPY DIAG:  Cervicalgia  Other abnormalities of gait and mobility  Difficulty in walking, not elsewhere classified  Other low back pain  Rationale for Evaluation and Treatment: Rehabilitation  ONSET DATE: DOS: 1/14 fusion of c3-c6  SUBJECTIVE:  SUBJECTIVE STATEMENT:  The patient has had a progressive onset of balance and gait disturbances. At the start f physical therapy for his neck he was walking with some difficulty but not using a device. At this point he is using a walker for walking even short distances. He feels that on certain days he has difficulty walking even with the walker. He has no significant pain just weakness. He has seen a back doctor who does not feel it is coming from his back. He has also seen a neurologist who also did not have a clear etiology for his weakness. He will see a vascular doctor but not until November.    Eval: Patient has long history of cervical pain.  He is found to have cervical spinal cord compression.  He has cervical  fixation from C3-C6 08/25/2023.  He has increased pain since that point.  He reports pain with starts in his neck and goes down into both shoulders.  He also reports weakness in bilateral upper extremities. He does have intermittent lumbar spine pain but the pain does not correlate to the loss of strength in his legs.     Hand dominance: Right  PERTINENT HISTORY:  Anemia, acquired equinus deformity; CAD< CKD, DMII, IBS, MI, MI, neuralgia, PF, carotid stenosis Stroke, Torn Rotator cuff; CABG; vein bypass graft 1998; Spine surgery in 2014     PAIN:  PAIN:  Are you having pain? Yes: NPRS scale: 5/10 with rotation Pain location: cervical spine into bilateral shoulders  Pain description: aching Aggravating factors: UE activity  Relieving factors:  rest  PRECAUTIONS: None  RED FLAGS: None     WEIGHT BEARING RESTRICTIONS: No  FALLS:  Has patient fallen in last 6 months? No    OCCUPATION:  Lives   PLOF: Independent  PATIENT GOALS: get pain down and get stronger  NEXT MD VISIT:   OBJECTIVE:  Note: Objective measures were completed at Evaluation unless otherwise noted.  DIAGNOSTIC FINDINGS:  Surgery: CERVICAL THREE-FOUR, CERVICAL FOUR-FIVE, CERVICAL FIVE-SIX ANTERIOR CERVICAL DECOMPRESSION/DISCECTOMY FUSION   PATIENT SURVEYS:  NDI    COGNITION: Overall cognitive status: Within functional limits for tasks assessed  SENSATION: WFL  POSTURE: rounded shoulders, forward head, and flexed trunk   PALPATION: Spasming in cervical spine and into peri-scapular area  CERVICAL ROM:   Active ROM A/PROM (deg) eval AROM 6/18   Flexion 25   Extension 20   Right lateral flexion 2   Left lateral flexion 2   Right rotation 42 54  Left rotation 40 63   (Blank rows = not tested)  UPPER EXTREMITY ROM:  Active ROM Right eval Left eval  Shoulder flexion 135 120  Shoulder extension    Shoulder abduction 155 145  Shoulder adduction    Shoulder extension    Shoulder  internal rotation T12 T12  Shoulder external rotation T1 Base of occiput  Elbow flexion    Elbow extension    Wrist flexion    Wrist extension    Wrist ulnar deviation    Wrist radial deviation    Wrist pronation    Wrist supination     (Blank rows = not tested)  UPPER EXTREMITY MMT:  MMT Right eval Left eval Right  6/18 Left  6/18  Shoulder flexion 4 4- 4+ 4+  Shoulder extension      Shoulder abduction      Shoulder adduction      Shoulder extension      Shoulder internal rotation  Shoulder external rotation      Middle trapezius      Lower trapezius      Elbow flexion      Elbow extension      Wrist flexion      Wrist extension      Wrist ulnar deviation      Wrist radial deviation      Wrist pronation      Wrist supination      Grip strength       (Blank rows = not tested)  Grip test:  R: 50lbs  L: 20lbs  Grip test : 03/15/24 Rt : 45#, 59# Lt: 49#, 52#   LOWER EXTREMITY MMT:    MMT Right Eval 9/3 Left Eval 9/3  Hip flexion 24.3 22.1  Hip extension    Hip abduction 32.2 38.7  Hip adduction    Hip internal rotation    Hip external rotation    Knee flexion 42.9 44.2  Knee extension    Ankle dorsiflexion    Ankle plantarflexion    Ankle inversion    Ankle eversion     (Blank rows = not tested)   CERVICAL SPECIAL TESTS:    FUNCTIONAL TESTS:    Previous balance assessment: Feet together eyes closed: able to complete, moderate swaying Tandem eyes closed: moderate swaying   9/3 Feet together eyes open CGA  Feet together eyes closed: able to complete, moderate assist  Tandem eyes closed: moderate assist      5x sit to stand 24 sec   3 min walk test:  Made it 2:14 before requiring a seated rest break  Distance 223 feet.   Treatment Date: 9/3  Gait: 3 min walk test   There-act:  5x sit to stand   There-ex: Review of use of nu-step and set up 5 min L5    Manual:  Trigger point release using a tennis ball for lumbar  spine pain   Neuro-re-ed Balance testing  8/26 Manual:  Trigger point release to upper traps, cervical paraspinals, levator Supine cervical PROM STM to scalenes, cervical ps Suboccipital release Cervical AROM seated  Instruction in theracane Wand flexion with cuing for posture x10 1# bar seated Seated scap retraction (cues for correct performance)    8/18 Manual:  Trigger point release to upper traps, cervical paraspinals, levator Supine cervical PROM STM to scalenes, cervical ps Suboccipital release Cervical AROM seated  Wand flexion with cuing for posture x10 1# bar seated Seated scap retraction (cues for correct performance)  Posterior shoulder rolls (attempted, but switched to shrugs instead) x10 Postural awareness education    8/13 Manual:  Trigger point release to upper traps, cervical paraspinals, levator.  neuro-re-ed:  Bilateral er yellow 3x10 with cuing for posture  Horizontal abduction 3x10 with cuing for posture yellow  Wand flexion with cuing for posture 3x10    03/15/24: -Seated marching with cueing for posture 3 x 10 - grip strength test (see above) -Standing UE reaching exercise, placing cones on shelves chest height to overhead height, same side and crossing midline - semi-tandem stance without UE support 20s x 2 each LE forward SBA (minor LOB, able to correct with stepping strategy )  - side stepping at counter with light UE support and increased step height with cues for neutral head - Trunk ext to neutral at sink -> back against wall shifting hips forward for hip flexor stretch - manual therapy: STM to Lt/Rt upper trap,Lt pec, and Rt/Lt periscapular musculature to decrease fascial  restrictions   7/30 Manual:  Trigger point release to upper traps, cervical paraspinals, levator.  Neuro-re-ed:  Bilateral er yellow 3x10 with cuing for posture  Horizontal abduction 3x10 with cuing for posture yellow   Slow march with cuing for upright posture  and head straight forward 3x10   Heel/toe rock with cuing for good posture 3x10   There-act:  Sit to stand 3x3 with cuing to keep head up and eyes straight forward.     PATIENT EDUCATION:  Education details: Symptom management, HEP, self care Person educated: Patient Education method: Explanation, Demonstration, Tactile cues, Verbal cues, and Handouts Education comprehension: verbalized understanding and returned demonstration  HOME EXERCISE PROGRAM: [id number]  ASSESSMENT:  CLINICAL IMPRESSION:  Patient returns to physical therapy with a prescription for gait disturbance.  He had progressive loss of ability to ambulate over the past 2 to 3 months.  On his 3-minute walk test he was unable to complete without seated rest break.  He presents with significant weakness in bilateral lower extremities.  He has decreased balance compared to previous balance assessment.  He did see a vascular doctor in November.  He would benefit from skilled therapy to improve overall balance and ability to ambulate in the community.  Will perform Berg balance test next visit as time allows. Eval:  Patient is a 78 year old male status post C3-C6 cervical fixation on 08/24/2022.  At this point he presents with limited upper extremity strength, limited cervical mobility, and pain with functional activities.  He has decreased grip strength on the left compared to the right.  He would benefit from skilled therapy to improve ability to use arms for functional activity.  OBJECTIVE IMPAIRMENTS: Abnormal gait, decreased activity tolerance, decreased balance, decreased endurance, difficulty walking, decreased ROM, decreased strength, increased fascial restrictions, increased muscle spasms, postural dysfunction, and pain.   ACTIVITY LIMITATIONS: carrying, lifting, sitting, standing, squatting, transfers, dressing, self feeding, reach over head, and hygiene/grooming  PARTICIPATION LIMITATIONS: meal prep, cleaning, laundry,  driving, shopping, community activity, and yard work  PERSONAL FACTORS: 3+ comorbidities: multi joint OA, low back pain, right leg stent failure  are also affecting patient's functional outcome.   REHAB POTENTIAL: Good  CLINICAL DECISION MAKING: Evolving/moderate complexity  EVALUATION COMPLEXITY: Moderate   GOALS: Goals reviewed with patient? Yes  SHORT TERM GOALS: Target date: 12/29/2023    Patient will increase cervical rotation by 10 degrees  Baseline:  Goal status MET 6/18   2.  Patient will demonstrate full flexion of bilateral shoulders  Baseline:  Goal status: improving 6/18   3.  Patient will increase grip strength by 10 lbs  Baseline: see above Goal status:  Partially met - 03/15/24  4. Therapy will perform BErG balance testing on the patient  9/4 new goal   5. Patient will increase 3 min walk test distance to 300; 9/4 new goal      LONG TERM GOALS: Target date: 01/26/2024    Patient will perform ADL's without neck pain  Baseline:  Goal status: In progress 8/5  2.  Patient will demonstrate 50 degrees of cervical rotation in order to drive  Baseline:  Goal status: MET  6/18    3.  Patient will demonstrate good functional balance in order to improve safety  Baseline:  Goal status: still limited   4. patient will ambulate through a 6 min walk test without a seated rest break. Goal:  9/4  5. Patient will demonstrate goo static standing balance in order to increase safety.  Goal:  9/4       PLAN:  PT FREQUENCY: 1-2x/week  PT DURATION: 8 weeks  PLANNED INTERVENTIONS:   PLAN FOR NEXT SESSION: Consider trigger point release to bilateral upper trap, cervical paraspinals, and parascapular muscles.  Continue with postural exercises. BERG balance testing.     04/14/24 5:12 PM [facility] [address] [place], [place], [address] Phone: [phone]   Fax:  [phone]

## 2024-04-14 ENCOUNTER — Encounter (HOSPITAL_BASED_OUTPATIENT_CLINIC_OR_DEPARTMENT_OTHER): Payer: Self-pay | Admitting: Physical Therapy

## 2024-04-18 ENCOUNTER — Ambulatory Visit (HOSPITAL_BASED_OUTPATIENT_CLINIC_OR_DEPARTMENT_OTHER): Payer: Self-pay

## 2024-04-18 ENCOUNTER — Encounter (HOSPITAL_BASED_OUTPATIENT_CLINIC_OR_DEPARTMENT_OTHER): Payer: Self-pay

## 2024-04-18 DIAGNOSIS — E1142 Type 2 diabetes mellitus with diabetic polyneuropathy: Secondary | ICD-10-CM | POA: Diagnosis not present

## 2024-04-18 DIAGNOSIS — M542 Cervicalgia: Secondary | ICD-10-CM

## 2024-04-18 DIAGNOSIS — R2689 Other abnormalities of gait and mobility: Secondary | ICD-10-CM

## 2024-04-18 NOTE — Therapy (Signed)
 OUTPATIENT PHYSICAL THERAPY  TREATMENT   Patient Name: Bradley [name] MRN: [id number] DOB:[birth year], 78 y.o., male Today's Date: 04/18/2024  END OF SESSION:  PT End of Session - 04/18/24 1303     Visit Number 14    Number of Visits 29    Date for PT Re-Evaluation 06/09/24    Authorization Type Re eval done at visit 13    Progress Note Due on Visit 22    PT Start Time 1302    PT Stop Time 1345    PT Time Calculation (min) 43 min    Activity Tolerance Patient tolerated treatment well    Behavior During Therapy [facility] for tasks assessed/performed             Past Medical History:  Diagnosis Date   Acquired equinus deformity of foot 12/15/2011   Anemia    Arthritis    Carotid artery occlusion    a. Carotid US  10/16: RICA 60-79%, L CEA patent with 1-39% stenosis   CKD (chronic kidney disease)    stage 3-4   Coagulation disorder ([name]) 05/25/2019   Coronary artery disease    a. Myoview  9/16:  EF 52%, inferior fixed defect consistent with diaphragmatic attenuation, intermediate risk secondary to poor exercise tolerance and symptoms during stress;  b. LHC 05/17/2015 90% mid LAD, 80% ost D2, 75% mid RCA, 35% prox RCA. >> S/p CABG   Coronary artery disease involving coronary bypass graft of native heart with angina pectoris ([name])    CORONARY ARTERY BYPASS GRAFTING x 3 - 05/23/2015 Left internal mammary artery to left anterior descending  Saphenous vein graft to diagonal  Saphenous vein graft to posterior descending Endoscopic greater saphenous vein harvest right thigh     Developmental delay    Diabetes mellitus age 41   Diverticulitis    Diverticulosis of colon without hemorrhage 05/17/2015   By CT scan    DM type 2, controlled, with complication ([name]) 04/29/2015   Essential hypertension 04/29/2015   GERD (gastroesophageal reflux disease)    H/O hiatal hernia    History of echocardiogram    a. Echo 9/16: GLS -15.2%, EF 55-60%, grade 1 diastolic dysfunction, normal wall motion,  aortic sclerosis, dilated aortic root 39 mm, atrial septal lipomatous hypertrophy   Hyperlipidemia    Hypertension    Irritable bowel syndrome (IBS)    Myocardial infarction ([name])    silent inferior MI; patient denies MI history (03/17/13)    Neuralgia    Neuropathy 2013   OSA on CPAP 05/30/2015   Osteoporosis 2013   PAF (paroxysmal atrial fibrillation) ([name])    post CABG 2016; Amiodarone  stopped 2/2 wheezing; PAF 01/12/20   Peripheral vascular disease ([name])    Plantar fasciitis 12/15/2011   Pneumonia    Reflux esophagitis    Right bundle branch block 04/30/2015   Sleep apnea    uses CPAP   Stenosis of right internal carotid artery with cerebral infarction ([name]) 12/09/2011   Stress fracture of metatarsal bone 02/18/2012   Stroke ([name]) 1987   Right brain stroke- slight drop on left side   Syncope 01/12/2020   Type 2 diabetes mellitus with vascular disease ([name]) 05/30/2015   Wears glasses    Past Surgical History:  Procedure Laterality Date   ABDOMINAL AORTOGRAM W/LOWER EXTREMITY Bilateral 11/27/2020   Procedure: ABDOMINAL AORTOGRAM W/LOWER EXTREMITY;  Surgeon: Serene Gaile ORN, MD;  Location: [facility] INVASIVE CV LAB;  Service: Cardiovascular;  Laterality: Bilateral;   ANTERIOR CERVICAL DECOMP/DISCECTOMY FUSION N/A 08/25/2023  Procedure: CERVICAL THREE-FOUR, CERVICAL FOUR-FIVE, CERVICAL FIVE-SIX ANTERIOR CERVICAL DECOMPRESSION/DISCECTOMY FUSION;  Surgeon: Debby Dorn MATSU, MD;  Location: [facility] OR;  Service: Neurosurgery;  Laterality: N/A;   CARDIAC CATHETERIZATION N/A 05/17/2015   Procedure: Left Heart Cath and Coronary Angiography;  Surgeon: Victory LELON Sharps, MD;  Location: [facility] INVASIVE CV LAB;  Service: Cardiovascular;  Laterality: N/A;   CAROTID ENDARTERECTOMY  1994 & redo 2001   Left   CATARACT EXTRACTION W/ INTRAOCULAR LENS IMPLANT Bilateral    COLONOSCOPY W/ BIOPSIES AND POLYPECTOMY     CORONARY ARTERY BYPASS GRAFT N/A 05/23/2015   Procedure: CORONARY ARTERY BYPASS GRAFTING (CABG) x 3  (LIMA to LAD, SVG to DIAGONAL 2, SVG to PDA) with Endoscopic Vein Harvesting from right greater saphenous vein;  Surgeon: Dallas KATHEE Jude, MD;  Location: [facility] OR;  Service: Open Heart Surgery;  Laterality: N/A;   ENDARTERECTOMY FEMORAL Right 11/30/2020   Procedure: ENDARTERECTOMY FEMORAL RIGHT;  Surgeon: Oris Krystal FALCON, MD;  Location: [facility] OR;  Service: Vascular;  Laterality: Right;   FEMORAL-POPLITEAL BYPASS GRAFT Right 11/30/2020   Procedure: BYPASS GRAFT FEMORAL-POPLITEAL ARTERY RIGHT;  Surgeon: Oris Krystal FALCON, MD;  Location: [facility] OR;  Service: Vascular;  Laterality: Right;   FRACTURE SURGERY  2013   Right   foo  t X's 2   ILIAC ARTERY STENT     LAPAROSCOPIC CHOLECYSTECTOMY  09/11/2016   POSTERIOR LUMBAR FUSION  03/24/2013   Level 1   PR VEIN BYPASS GRAFT,AORTO-FEM-POP  1998   SPINE SURGERY  2014   TEE WITHOUT CARDIOVERSION N/A 05/23/2015   Procedure: TRANSESOPHAGEAL ECHOCARDIOGRAM (TEE);  Surgeon: Dallas KATHEE Jude, MD;  Location: [facility] OR;  Service: Open Heart Surgery;  Laterality: N/A;   TONSILLECTOMY     TRANSCAROTID ARTERY REVASCULARIZATION  Right 06/01/2020   Procedure: RIGHT TRANSCAROTID ARTERY REVASCULARIZATION;  Surgeon: Serene Gaile LELON, MD;  Location: [facility] OR;  Service: Vascular;  Laterality: Right;   TRIGGER FINGER RELEASE Right 07/14/2017   Procedure: RIGHT LONG FINGER TRIGGER RELEASE;  Surgeon: Sebastian Lenis, MD;  Location: [facility];  Service: Orthopedics;  Laterality: Right;   Patient Active Problem List   Diagnosis Date Noted   Cervical myelopathy ([name]) 08/25/2023   Diabetic neuropathy ([name]) 06/03/2022   PAD (peripheral artery disease) ([name]) 11/30/2020   Carotid stenosis 06/01/2020   Carotid stenosis, asymptomatic 06/01/2020   Syncope 01/12/2020   Coagulation disorder ([name]) 05/25/2019   Coronary artery disease involving coronary bypass graft of native heart with angina pectoris ([name])    PAF (paroxysmal atrial fibrillation) ([name])    Type 2 diabetes mellitus  with vascular disease ([name]) 05/30/2015   OSA on CPAP 05/30/2015   Abnormal stress test 05/18/2015   Diverticulosis of colon without hemorrhage 05/17/2015   Right bundle branch block 04/30/2015   Essential hypertension 04/29/2015   Hyperlipidemia 04/29/2015   DM type 2, controlled, with complication ([name]) 04/29/2015   Peripheral vascular disease ([name]) 06/08/2012   Stress fracture of metatarsal bone 02/18/2012   Acquired equinus deformity of foot 12/15/2011   Plantar fasciitis 12/15/2011   Stenosis of right internal carotid artery with cerebral infarction ([name]) 12/09/2011      PCP: Carlin Gull MD  REFERRING PROVIDER: Debby Dorn MATSU, MD  REFERRING DIAG: [phone] (ICD-10-CM) - Other spondylosis with myelopathy, cervical region   THERAPY DIAG:  Cervicalgia  Other abnormalities of gait and mobility  Rationale for Evaluation and Treatment: Rehabilitation  ONSET DATE: DOS: 1/14 fusion of c3-c6  SUBJECTIVE:  SUBJECTIVE STATEMENT:   Pt reports he had a bad weekend. I did a lot of walking. States he bumped into the walls a little and also twisted his L knee when walking to his car this weekend. Was not using AD. He denies any falls. No knee pain reported at entry.   PN: The patient has had a progressive onset of balance and gait disturbances. At the start f physical therapy for his neck he was walking with some difficulty but not using a device. At this point he is using a walker for walking even short distances. He feels that on certain days he has difficulty walking even with the walker. He has no significant pain just weakness. He has seen a back doctor who does not feel it is coming from his back. He has also seen a neurologist who also did not have a clear etiology for his weakness.  He will see a vascular doctor but not until November.    Eval: Patient has long history of cervical pain.  He is found to have cervical spinal cord compression.  He has cervical fixation from C3-C6 08/25/2023.  He has increased pain since that point.  He reports pain with starts in his neck and goes down into both shoulders.  He also reports weakness in bilateral upper extremities. He does have intermittent lumbar spine pain but the pain does not correlate to the loss of strength in his legs.     Hand dominance: Right  PERTINENT HISTORY:  Anemia, acquired equinus deformity; CAD< CKD, DMII, IBS, MI, MI, neuralgia, PF, carotid stenosis Stroke, Torn Rotator cuff; CABG; vein bypass graft 1998; Spine surgery in 2014     PAIN:  PAIN:  Are you having pain? Yes: NPRS scale: 5/10 with rotation Pain location: cervical spine into bilateral shoulders  Pain description: aching Aggravating factors: UE activity  Relieving factors:  rest  PRECAUTIONS: None  RED FLAGS: None     WEIGHT BEARING RESTRICTIONS: No  FALLS:  Has patient fallen in last 6 months? No    OCCUPATION:  Lives   PLOF: Independent  PATIENT GOALS: get pain down and get stronger  NEXT MD VISIT:   OBJECTIVE:  Note: Objective measures were completed at Evaluation unless otherwise noted.  DIAGNOSTIC FINDINGS:  Surgery: CERVICAL THREE-FOUR, CERVICAL FOUR-FIVE, CERVICAL FIVE-SIX ANTERIOR CERVICAL DECOMPRESSION/DISCECTOMY FUSION   PATIENT SURVEYS:  NDI    COGNITION: Overall cognitive status: Within functional limits for tasks assessed  SENSATION: WFL  POSTURE: rounded shoulders, forward head, and flexed trunk   PALPATION: Spasming in cervical spine and into peri-scapular area  CERVICAL ROM:   Active ROM A/PROM (deg) eval AROM 6/18   Flexion 25   Extension 20   Right lateral flexion 2   Left lateral flexion 2   Right rotation 42 54  Left rotation 40 63   (Blank rows = not tested)  UPPER EXTREMITY  ROM:  Active ROM Right eval Left eval  Shoulder flexion 135 120  Shoulder extension    Shoulder abduction 155 145  Shoulder adduction    Shoulder extension    Shoulder internal rotation T12 T12  Shoulder external rotation T1 Base of occiput  Elbow flexion    Elbow extension    Wrist flexion    Wrist extension    Wrist ulnar deviation    Wrist radial deviation    Wrist pronation    Wrist supination     (Blank rows = not tested)  UPPER EXTREMITY MMT:  MMT Right  eval Left eval Right  6/18 Left  6/18  Shoulder flexion 4 4- 4+ 4+  Shoulder extension      Shoulder abduction      Shoulder adduction      Shoulder extension      Shoulder internal rotation      Shoulder external rotation      Middle trapezius      Lower trapezius      Elbow flexion      Elbow extension      Wrist flexion      Wrist extension      Wrist ulnar deviation      Wrist radial deviation      Wrist pronation      Wrist supination      Grip strength       (Blank rows = not tested)  Grip test:  R: 50lbs  L: 20lbs  Grip test : 03/15/24 Rt : 45#, 59# Lt: 49#, 52#   LOWER EXTREMITY MMT:    MMT Right Eval 9/3 Left Eval 9/3  Hip flexion 24.3 22.1  Hip extension    Hip abduction 32.2 38.7  Hip adduction    Hip internal rotation    Hip external rotation    Knee flexion 42.9 44.2  Knee extension    Ankle dorsiflexion    Ankle plantarflexion    Ankle inversion    Ankle eversion     (Blank rows = not tested)   CERVICAL SPECIAL TESTS:    FUNCTIONAL TESTS:    Previous balance assessment: Feet together eyes closed: able to complete, moderate swaying Tandem eyes closed: moderate swaying   9/3 Feet together eyes open CGA  Feet together eyes closed: able to complete, moderate assist  Tandem eyes closed: moderate assist      5x sit to stand 24 sec   3 min walk test:  Made it 2:14 before requiring a seated rest break  Distance 223 feet.    BERG Balance Test          Date:  04/18/24  Sit to Stand 4  Standing unsupported 4  Sitting with back unsupported but feet supported 4  Stand to sit  4  Transfers  4  Standing unsupported with eyes closed 1  Standing unsupported feet together 3  From standing position, reach forward with outstretched arm 3  From standing position, pick up object from floor 3  From standing position, turn and look behind over each shoulder 4  Turn 360 4  Standing unsupported, alternately place foot on step 0  Standing unsupported, one foot in front 2  Standing on one leg 1  Total:  40/56        Treatment Date:  04/18/24 BERG balance test 40/56 Nu-step L5 Cervical PROM STM cervical paraspinals  9/3  Gait: 3 min walk test   There-act:  5x sit to stand   There-ex: Review of use of nu-step and set up 5 min L5    Manual:  Trigger point release using a tennis ball for lumbar spine pain   Neuro-re-ed Balance testing  8/26 Manual:  Trigger point release to upper traps, cervical paraspinals, levator Supine cervical PROM STM to scalenes, cervical ps Suboccipital release Cervical AROM seated  Instruction in theracane Wand flexion with cuing for posture x10 1# bar seated Seated scap retraction (cues for correct performance)    8/18 Manual:  Trigger point release to upper traps, cervical paraspinals, levator Supine cervical PROM STM to scalenes,  cervical ps Suboccipital release Cervical AROM seated  Wand flexion with cuing for posture x10 1# bar seated Seated scap retraction (cues for correct performance)  Posterior shoulder rolls (attempted, but switched to shrugs instead) x10 Postural awareness education    8/13 Manual:  Trigger point release to upper traps, cervical paraspinals, levator.  neuro-re-ed:  Bilateral er yellow 3x10 with cuing for posture  Horizontal abduction 3x10 with cuing for posture yellow  Wand flexion with cuing for posture 3x10    PATIENT EDUCATION:  Education details:  Symptom management, HEP, self care Person educated: Patient Education method: Explanation, Demonstration, Tactile cues, Verbal cues, and Handouts Education comprehension: verbalized understanding and returned demonstration  HOME EXERCISE PROGRAM: [id number]  ASSESSMENT:  CLINICAL IMPRESSION:  Performed Berg balance testing today. Gait belt donned and utilized as needed. Pt fatigued quickly in LES following initial balance tests and demonstrated difficulty with sit to stand transfer following this. He required intermittent seated breaks between balance assessments. Following Berg testing, spent time on cervical spine interventions to address deficits/pain present here. Pt reported improved cervical mobility at end of session. Pt requires additional PT to address cervical and balance deficits.    PN: Patient returns to physical therapy with a prescription for gait disturbance.  He had progressive loss of ability to ambulate over the past 2 to 3 months.  On his 3-minute walk test he was unable to complete without seated rest break.  He presents with significant weakness in bilateral lower extremities.  He has decreased balance compared to previous balance assessment.  He did see a vascular doctor in November.  He would benefit from skilled therapy to improve overall balance and ability to ambulate in the community.  Will perform Berg balance test next visit as time allows. Eval:  Patient is a 78 year old male status post C3-C6 cervical fixation on 08/24/2022.  At this point he presents with limited upper extremity strength, limited cervical mobility, and pain with functional activities.  He has decreased grip strength on the left compared to the right.  He would benefit from skilled therapy to improve ability to use arms for functional activity.  OBJECTIVE IMPAIRMENTS: Abnormal gait, decreased activity tolerance, decreased balance, decreased endurance, difficulty walking, decreased ROM, decreased  strength, increased fascial restrictions, increased muscle spasms, postural dysfunction, and pain.   ACTIVITY LIMITATIONS: carrying, lifting, sitting, standing, squatting, transfers, dressing, self feeding, reach over head, and hygiene/grooming  PARTICIPATION LIMITATIONS: meal prep, cleaning, laundry, driving, shopping, community activity, and yard work  PERSONAL FACTORS: 3+ comorbidities: multi joint OA, low back pain, right leg stent failure  are also affecting patient's functional outcome.   REHAB POTENTIAL: Good  CLINICAL DECISION MAKING: Evolving/moderate complexity  EVALUATION COMPLEXITY: Moderate   GOALS: Goals reviewed with patient? Yes  SHORT TERM GOALS: Target date: 12/29/2023    Patient will increase cervical rotation by 10 degrees  Baseline:  Goal status MET 6/18   2.  Patient will demonstrate full flexion of bilateral shoulders  Baseline:  Goal status: improving 6/18   3.  Patient will increase grip strength by 10 lbs  Baseline: see above Goal status:  Partially met - 03/15/24  4. Therapy will perform BErG balance testing on the patient  MET 9/8 (40/56)    5. Patient will increase 3 min walk test distance to 300; 9/4 new goal      LONG TERM GOALS: Target date: 01/26/2024    Patient will perform ADL's without neck pain  Baseline:  Goal status: In progress 8/5  2.  Patient will demonstrate 50 degrees of cervical rotation in order to drive  Baseline:  Goal status: MET  6/18    3.  Patient will demonstrate good functional balance in order to improve safety  Baseline:  Goal status: still limited   4. patient will ambulate through a 6 min walk test without a seated rest break. Goal:  9/4  5. Patient will demonstrate goo static standing balance in order to increase safety.  Goal: 9/4       PLAN:  PT FREQUENCY: 1-2x/week  PT DURATION: 8 weeks  PLANNED INTERVENTIONS:   PLAN FOR NEXT SESSION: Consider trigger point release to bilateral  upper trap, cervical paraspinals, and parascapular muscles.  Continue with postural exercises. BERG balance testing.    Asberry Rodes, PTA  04/18/24 4:10 PM [facility] [address] [place], [place], [address] Phone: [phone]   Fax:  [phone]

## 2024-04-29 ENCOUNTER — Ambulatory Visit (HOSPITAL_BASED_OUTPATIENT_CLINIC_OR_DEPARTMENT_OTHER): Payer: Self-pay | Admitting: Physical Therapy

## 2024-04-29 DIAGNOSIS — M542 Cervicalgia: Secondary | ICD-10-CM

## 2024-04-29 DIAGNOSIS — M5459 Other low back pain: Secondary | ICD-10-CM

## 2024-04-29 DIAGNOSIS — R262 Difficulty in walking, not elsewhere classified: Secondary | ICD-10-CM

## 2024-04-29 DIAGNOSIS — R2689 Other abnormalities of gait and mobility: Secondary | ICD-10-CM

## 2024-04-29 NOTE — Therapy (Signed)
 OUTPATIENT PHYSICAL THERAPY  TREATMENT   Patient Name: Bradley [name] MRN: [id number] DOB:08/01/1946, 78 y.o., male Today's Date: 05/01/2024  END OF SESSION:  PT End of Session - 05/01/24 1725     Visit Number 15    Number of Visits 29    Date for Recertification  06/09/24    Authorization Type Re eval done at visit 13    PT Start Time 1345    PT Stop Time 1425    PT Time Calculation (min) 40 min    Activity Tolerance Patient tolerated treatment well    Behavior During Therapy [facility] for tasks assessed/performed              Past Medical History:  Diagnosis Date   Acquired equinus deformity of foot 12/15/2011   Anemia    Arthritis    Carotid artery occlusion    a. Carotid US  10/16: RICA 60-79%, L CEA patent with 1-39% stenosis   CKD (chronic kidney disease)    stage 3-4   Coagulation disorder ([name]) 05/25/2019   Coronary artery disease    a. Myoview  9/16:  EF 52%, inferior fixed defect consistent with diaphragmatic attenuation, intermediate risk secondary to poor exercise tolerance and symptoms during stress;  b. LHC 05/17/2015 90% mid LAD, 80% ost D2, 75% mid RCA, 35% prox RCA. >> S/p CABG   Coronary artery disease involving coronary bypass graft of native heart with angina pectoris ([name])    CORONARY ARTERY BYPASS GRAFTING x 3 - 05/23/2015 Left internal mammary artery to left anterior descending  Saphenous vein graft to diagonal  Saphenous vein graft to posterior descending Endoscopic greater saphenous vein harvest right thigh     Developmental delay    Diabetes mellitus age 77   Diverticulitis    Diverticulosis of colon without hemorrhage 05/17/2015   By CT scan    DM type 2, controlled, with complication ([name]) 04/29/2015   Essential hypertension 04/29/2015   GERD (gastroesophageal reflux disease)    H/O hiatal hernia    History of echocardiogram    a. Echo 9/16: GLS -15.2%, EF 55-60%, grade 1 diastolic dysfunction, normal wall motion, aortic sclerosis, dilated  aortic root 39 mm, atrial septal lipomatous hypertrophy   Hyperlipidemia    Hypertension    Irritable bowel syndrome (IBS)    Myocardial infarction ([name])    silent inferior MI; patient denies MI history (03/17/13)    Neuralgia    Neuropathy 2013   OSA on CPAP 05/30/2015   Osteoporosis 2013   PAF (paroxysmal atrial fibrillation) ([name])    post CABG 2016; Amiodarone  stopped 2/2 wheezing; PAF 01/12/20   Peripheral vascular disease ([name])    Plantar fasciitis 12/15/2011   Pneumonia    Reflux esophagitis    Right bundle branch block 04/30/2015   Sleep apnea    uses CPAP   Stenosis of right internal carotid artery with cerebral infarction ([name]) 12/09/2011   Stress fracture of metatarsal bone 02/18/2012   Stroke ([name]) 1987   Right brain stroke- slight drop on left side   Syncope 01/12/2020   Type 2 diabetes mellitus with vascular disease ([name]) 05/30/2015   Wears glasses    Past Surgical History:  Procedure Laterality Date   ABDOMINAL AORTOGRAM W/LOWER EXTREMITY Bilateral 11/27/2020   Procedure: ABDOMINAL AORTOGRAM W/LOWER EXTREMITY;  Surgeon: Serene Gaile ORN, MD;  Location: [facility] INVASIVE CV LAB;  Service: Cardiovascular;  Laterality: Bilateral;   ANTERIOR CERVICAL DECOMP/DISCECTOMY FUSION N/A 08/25/2023   Procedure: CERVICAL THREE-FOUR, CERVICAL FOUR-FIVE, CERVICAL FIVE-SIX  ANTERIOR CERVICAL DECOMPRESSION/DISCECTOMY FUSION;  Surgeon: Debby Dorn MATSU, MD;  Location: [facility] OR;  Service: Neurosurgery;  Laterality: N/A;   CARDIAC CATHETERIZATION N/A 05/17/2015   Procedure: Left Heart Cath and Coronary Angiography;  Surgeon: Victory LELON Sharps, MD;  Location: [facility] INVASIVE CV LAB;  Service: Cardiovascular;  Laterality: N/A;   CAROTID ENDARTERECTOMY  1994 & redo 2001   Left   CATARACT EXTRACTION W/ INTRAOCULAR LENS IMPLANT Bilateral    COLONOSCOPY W/ BIOPSIES AND POLYPECTOMY     CORONARY ARTERY BYPASS GRAFT N/A 05/23/2015   Procedure: CORONARY ARTERY BYPASS GRAFTING (CABG) x 3 (LIMA to LAD, SVG to  DIAGONAL 2, SVG to PDA) with Endoscopic Vein Harvesting from right greater saphenous vein;  Surgeon: Dallas KATHEE Jude, MD;  Location: [facility] OR;  Service: Open Heart Surgery;  Laterality: N/A;   ENDARTERECTOMY FEMORAL Right 11/30/2020   Procedure: ENDARTERECTOMY FEMORAL RIGHT;  Surgeon: Oris Krystal FALCON, MD;  Location: [facility] OR;  Service: Vascular;  Laterality: Right;   FEMORAL-POPLITEAL BYPASS GRAFT Right 11/30/2020   Procedure: BYPASS GRAFT FEMORAL-POPLITEAL ARTERY RIGHT;  Surgeon: Oris Krystal FALCON, MD;  Location: [facility] OR;  Service: Vascular;  Laterality: Right;   FRACTURE SURGERY  2013   Right   foo  t X's 2   ILIAC ARTERY STENT     LAPAROSCOPIC CHOLECYSTECTOMY  09/11/2016   POSTERIOR LUMBAR FUSION  03/24/2013   Level 1   PR VEIN BYPASS GRAFT,AORTO-FEM-POP  1998   SPINE SURGERY  2014   TEE WITHOUT CARDIOVERSION N/A 05/23/2015   Procedure: TRANSESOPHAGEAL ECHOCARDIOGRAM (TEE);  Surgeon: Dallas KATHEE Jude, MD;  Location: [facility] OR;  Service: Open Heart Surgery;  Laterality: N/A;   TONSILLECTOMY     TRANSCAROTID ARTERY REVASCULARIZATION  Right 06/01/2020   Procedure: RIGHT TRANSCAROTID ARTERY REVASCULARIZATION;  Surgeon: Serene Gaile LELON, MD;  Location: [facility] OR;  Service: Vascular;  Laterality: Right;   TRIGGER FINGER RELEASE Right 07/14/2017   Procedure: RIGHT LONG FINGER TRIGGER RELEASE;  Surgeon: Sebastian Lenis, MD;  Location: [facility];  Service: Orthopedics;  Laterality: Right;   Patient Active Problem List   Diagnosis Date Noted   Cervical myelopathy ([name]) 08/25/2023   Diabetic neuropathy ([name]) 06/03/2022   PAD (peripheral artery disease) ([name]) 11/30/2020   Carotid stenosis 06/01/2020   Carotid stenosis, asymptomatic 06/01/2020   Syncope 01/12/2020   Coagulation disorder ([name]) 05/25/2019   Coronary artery disease involving coronary bypass graft of native heart with angina pectoris ([name])    PAF (paroxysmal atrial fibrillation) ([name])    Type 2 diabetes mellitus with vascular disease  ([name]) 05/30/2015   OSA on CPAP 05/30/2015   Abnormal stress test 05/18/2015   Diverticulosis of colon without hemorrhage 05/17/2015   Right bundle branch block 04/30/2015   Essential hypertension 04/29/2015   Hyperlipidemia 04/29/2015   DM type 2, controlled, with complication ([name]) 04/29/2015   Peripheral vascular disease ([name]) 06/08/2012   Stress fracture of metatarsal bone 02/18/2012   Acquired equinus deformity of foot 12/15/2011   Plantar fasciitis 12/15/2011   Stenosis of right internal carotid artery with cerebral infarction ([name]) 12/09/2011      PCP: Carlin Gull MD  REFERRING PROVIDER: Debby Dorn MATSU, MD  REFERRING DIAG: [phone] (ICD-10-CM) - Other spondylosis with myelopathy, cervical region   THERAPY DIAG:  Cervicalgia  Other abnormalities of gait and mobility  Difficulty in walking, not elsewhere classified  Other low back pain  Rationale for Evaluation and Treatment: Rehabilitation  ONSET DATE: DOS: 1/14 fusion of c3-c6  SUBJECTIVE:  SUBJECTIVE STATEMENT: Reports his neck has been sore over the last few days.  He also reports his legs are very tired and shaky today.  He did workout yesterday.  PN: The patient has had a progressive onset of balance and gait disturbances. At the start f physical therapy for his neck he was walking with some difficulty but not using a device. At this point he is using a walker for walking even short distances. He feels that on certain days he has difficulty walking even with the walker. He has no significant pain just weakness. He has seen a back doctor who does not feel it is coming from his back. He has also seen a neurologist who also did not have a clear etiology for his weakness. He will see a vascular doctor but not until  November.    Eval: Patient has long history of cervical pain.  He is found to have cervical spinal cord compression.  He has cervical fixation from C3-C6 08/25/2023.  He has increased pain since that point.  He reports pain with starts in his neck and goes down into both shoulders.  He also reports weakness in bilateral upper extremities. He does have intermittent lumbar spine pain but the pain does not correlate to the loss of strength in his legs.     Hand dominance: Right  PERTINENT HISTORY:  Anemia, acquired equinus deformity; CAD< CKD, DMII, IBS, MI, MI, neuralgia, PF, carotid stenosis Stroke, Torn Rotator cuff; CABG; vein bypass graft 1998; Spine surgery in 2014     PAIN:  PAIN:  Are you having pain? Yes: NPRS scale: 5/10 with rotation Pain location: cervical spine into bilateral shoulders  Pain description: aching Aggravating factors: UE activity  Relieving factors:  rest  PRECAUTIONS: None  RED FLAGS: None     WEIGHT BEARING RESTRICTIONS: No  FALLS:  Has patient fallen in last 6 months? No    OCCUPATION:  Lives   PLOF: Independent  PATIENT GOALS: get pain down and get stronger  NEXT MD VISIT:   OBJECTIVE:  Note: Objective measures were completed at Evaluation unless otherwise noted.  DIAGNOSTIC FINDINGS:  Surgery: CERVICAL THREE-FOUR, CERVICAL FOUR-FIVE, CERVICAL FIVE-SIX ANTERIOR CERVICAL DECOMPRESSION/DISCECTOMY FUSION   PATIENT SURVEYS:  NDI    COGNITION: Overall cognitive status: Within functional limits for tasks assessed  SENSATION: WFL  POSTURE: rounded shoulders, forward head, and flexed trunk   PALPATION: Spasming in cervical spine and into peri-scapular area  CERVICAL ROM:   Active ROM A/PROM (deg) eval AROM 6/18   Flexion 25   Extension 20   Right lateral flexion 2   Left lateral flexion 2   Right rotation 42 54  Left rotation 40 63   (Blank rows = not tested)  UPPER EXTREMITY ROM:  Active ROM Right eval Left eval   Shoulder flexion 135 120  Shoulder extension    Shoulder abduction 155 145  Shoulder adduction    Shoulder extension    Shoulder internal rotation T12 T12  Shoulder external rotation T1 Base of occiput  Elbow flexion    Elbow extension    Wrist flexion    Wrist extension    Wrist ulnar deviation    Wrist radial deviation    Wrist pronation    Wrist supination     (Blank rows = not tested)  UPPER EXTREMITY MMT:  MMT Right eval Left eval Right  6/18 Left  6/18  Shoulder flexion 4 4- 4+ 4+  Shoulder extension  Shoulder abduction      Shoulder adduction      Shoulder extension      Shoulder internal rotation      Shoulder external rotation      Middle trapezius      Lower trapezius      Elbow flexion      Elbow extension      Wrist flexion      Wrist extension      Wrist ulnar deviation      Wrist radial deviation      Wrist pronation      Wrist supination      Grip strength       (Blank rows = not tested)  Grip test:  R: 50lbs  L: 20lbs  Grip test : 03/15/24 Rt : 45#, 59# Lt: 49#, 52#   LOWER EXTREMITY MMT:    MMT Right Eval 9/3 Left Eval 9/3  Hip flexion 24.3 22.1  Hip extension    Hip abduction 32.2 38.7  Hip adduction    Hip internal rotation    Hip external rotation    Knee flexion 42.9 44.2  Knee extension    Ankle dorsiflexion    Ankle plantarflexion    Ankle inversion    Ankle eversion     (Blank rows = not tested)   CERVICAL SPECIAL TESTS:    FUNCTIONAL TESTS:    Previous balance assessment: Feet together eyes closed: able to complete, moderate swaying Tandem eyes closed: moderate swaying   9/3 Feet together eyes open CGA  Feet together eyes closed: able to complete, moderate assist  Tandem eyes closed: moderate assist      5x sit to stand 24 sec   3 min walk test:  Made it 2:14 before requiring a seated rest break  Distance 223 feet.    BERG Balance Test          Date: 04/18/24  Sit to Stand 4  Standing  unsupported 4  Sitting with back unsupported but feet supported 4  Stand to sit  4  Transfers  4  Standing unsupported with eyes closed 1  Standing unsupported feet together 3  From standing position, reach forward with outstretched arm 3  From standing position, pick up object from floor 3  From standing position, turn and look behind over each shoulder 4  Turn 360 4  Standing unsupported, alternately place foot on step 0  Standing unsupported, one foot in front 2  Standing on one leg 1  Total:  40/56        Treatment Date: 9/19  Manual:  Trigger point release to upper traps, cervical paraspinals, levator Supine cervical PROM STM to scalenes, cervical ps Suboccipital release Cervical AROM seated   Neuromuscular reeducation Bilateral ER red 3 x 12 Horizontal abduction 3 x 12 red Airex: Narrow base of support eyes open and eyes closed 3 x 30 seconds each Step on Airex forward and lateral 2 x 10  04/18/24 BERG balance test 40/56 Nu-step L5 Cervical PROM STM cervical paraspinals  9/3  Gait: 3 min walk test   There-act:  5x sit to stand   There-ex: Review of use of nu-step and set up 5 min L5    Manual:  Trigger point release using a tennis ball for lumbar spine pain   Neuro-re-ed Balance testing  8/26 Manual:  Trigger point release to upper traps, cervical paraspinals, levator Supine cervical PROM STM to scalenes, cervical ps Suboccipital release Cervical AROM seated  Instruction in theracane Wand flexion with cuing for posture x10 1# bar seated Seated scap retraction (cues for correct performance)    8/18 Manual:  Trigger point release to upper traps, cervical paraspinals, levator Supine cervical PROM STM to scalenes, cervical ps Suboccipital release Cervical AROM seated  Wand flexion with cuing for posture x10 1# bar seated Seated scap retraction (cues for correct performance)  Posterior shoulder rolls (attempted, but switched to  shrugs instead) x10 Postural awareness education    8/13 Manual:  Trigger point release to upper traps, cervical paraspinals, levator.  neuro-re-ed:  Bilateral er yellow 3x10 with cuing for posture  Horizontal abduction 3x10 with cuing for posture yellow  Wand flexion with cuing for posture 3x10    PATIENT EDUCATION:  Education details: Symptom management, HEP, self care Person educated: Patient Education method: Explanation, Demonstration, Tactile cues, Verbal cues, and Handouts Education comprehension: verbalized understanding and returned demonstration  HOME EXERCISE PROGRAM: [id number]  ASSESSMENT:  CLINICAL IMPRESSION: Patient has significant trigger point in left upper trap today.  He reported improved movement and improved pain following manual therapy.  We also focused on upper extremity postural correction as well as balance exercises on Airex.  Therapy will continue to work on legs and reducing cervical spine pain.   PN: Patient returns to physical therapy with a prescription for gait disturbance.  He had progressive loss of ability to ambulate over the past 2 to 3 months.  On his 3-minute walk test he was unable to complete without seated rest break.  He presents with significant weakness in bilateral lower extremities.  He has decreased balance compared to previous balance assessment.  He did see a vascular doctor in November.  He would benefit from skilled therapy to improve overall balance and ability to ambulate in the community.  Will perform Berg balance test next visit as time allows. Eval:  Patient is a 78 year old male status post C3-C6 cervical fixation on 08/24/2022.  At this point he presents with limited upper extremity strength, limited cervical mobility, and pain with functional activities.  He has decreased grip strength on the left compared to the right.  He would benefit from skilled therapy to improve ability to use arms for functional activity.  OBJECTIVE  IMPAIRMENTS: Abnormal gait, decreased activity tolerance, decreased balance, decreased endurance, difficulty walking, decreased ROM, decreased strength, increased fascial restrictions, increased muscle spasms, postural dysfunction, and pain.   ACTIVITY LIMITATIONS: carrying, lifting, sitting, standing, squatting, transfers, dressing, self feeding, reach over head, and hygiene/grooming  PARTICIPATION LIMITATIONS: meal prep, cleaning, laundry, driving, shopping, community activity, and yard work  PERSONAL FACTORS: 3+ comorbidities: multi joint OA, low back pain, right leg stent failure  are also affecting patient's functional outcome.   REHAB POTENTIAL: Good  CLINICAL DECISION MAKING: Evolving/moderate complexity  EVALUATION COMPLEXITY: Moderate   GOALS: Goals reviewed with patient? Yes  SHORT TERM GOALS: Target date: 12/29/2023    Patient will increase cervical rotation by 10 degrees  Baseline:  Goal status MET 6/18   2.  Patient will demonstrate full flexion of bilateral shoulders  Baseline:  Goal status: improving 6/18   3.  Patient will increase grip strength by 10 lbs  Baseline: see above Goal status:  Partially met - 03/15/24  4. Therapy will perform BErG balance testing on the patient  MET 9/8 (40/56)    5. Patient will increase 3 min walk test distance to 300; 9/4 new goal      LONG TERM GOALS: Target date: 01/26/2024  Patient will perform ADL's without neck pain  Baseline:  Goal status: In progress 8/5  2.  Patient will demonstrate 50 degrees of cervical rotation in order to drive  Baseline:  Goal status: MET  6/18    3.  Patient will demonstrate good functional balance in order to improve safety  Baseline:  Goal status: still limited   4. patient will ambulate through a 6 min walk test without a seated rest break. Goal:  9/4  5. Patient will demonstrate goo static standing balance in order to increase safety.  Goal: 9/4       PLAN:  PT  FREQUENCY: 1-2x/week  PT DURATION: 8 weeks  PLANNED INTERVENTIONS:   PLAN FOR NEXT SESSION: Consider trigger point release to bilateral upper trap, cervical paraspinals, and parascapular muscles.  Continue with postural exercises. BERG balance testing.     Alm Don PT DPT  05/01/24 5:26 PM [facility] [address] [place], [place], [address] Phone: [phone]   Fax:  [phone]

## 2024-05-01 ENCOUNTER — Encounter (HOSPITAL_BASED_OUTPATIENT_CLINIC_OR_DEPARTMENT_OTHER): Payer: Self-pay | Admitting: Physical Therapy

## 2024-05-06 ENCOUNTER — Encounter (HOSPITAL_BASED_OUTPATIENT_CLINIC_OR_DEPARTMENT_OTHER): Payer: Self-pay | Admitting: Physical Therapy

## 2024-05-06 ENCOUNTER — Ambulatory Visit (HOSPITAL_BASED_OUTPATIENT_CLINIC_OR_DEPARTMENT_OTHER): Payer: Self-pay | Admitting: Physical Therapy

## 2024-05-06 DIAGNOSIS — R2689 Other abnormalities of gait and mobility: Secondary | ICD-10-CM

## 2024-05-06 DIAGNOSIS — M542 Cervicalgia: Secondary | ICD-10-CM | POA: Diagnosis not present

## 2024-05-06 DIAGNOSIS — R262 Difficulty in walking, not elsewhere classified: Secondary | ICD-10-CM

## 2024-05-06 NOTE — Therapy (Signed)
 OUTPATIENT PHYSICAL THERAPY  TREATMENT   Patient Name: Bradley [name] MRN: [id number] DOB:[birth year], 78 y.o., male Today's Date: 05/06/2024  END OF SESSION:  PT End of Session - 05/06/24 1601     Visit Number 16    Number of Visits 29    Date for Recertification  06/09/24    Authorization Type Re eval done at visit 13    Progress Note Due on Visit 23    PT Start Time 1601    PT Stop Time 1639    PT Time Calculation (min) 38 min    Behavior During Therapy [facility] for tasks assessed/performed              Past Medical History:  Diagnosis Date   Acquired equinus deformity of foot 12/15/2011   Anemia    Arthritis    Carotid artery occlusion    a. Carotid US  10/16: RICA 60-79%, L CEA patent with 1-39% stenosis   CKD (chronic kidney disease)    stage 3-4   Coagulation disorder 05/25/2019   Coronary artery disease    a. Myoview  9/16:  EF 52%, inferior fixed defect consistent with diaphragmatic attenuation, intermediate risk secondary to poor exercise tolerance and symptoms during stress;  b. LHC 05/17/2015 90% mid LAD, 80% ost D2, 75% mid RCA, 35% prox RCA. >> S/p CABG   Coronary artery disease involving coronary bypass graft of native heart with angina pectoris    CORONARY ARTERY BYPASS GRAFTING x 3 - 05/23/2015 Left internal mammary artery to left anterior descending  Saphenous vein graft to diagonal  Saphenous vein graft to posterior descending Endoscopic greater saphenous vein harvest right thigh     Developmental delay    Diabetes mellitus age 61   Diverticulitis    Diverticulosis of colon without hemorrhage 05/17/2015   By CT scan    DM type 2, controlled, with complication ([name]) 04/29/2015   Essential hypertension 04/29/2015   GERD (gastroesophageal reflux disease)    H/O hiatal hernia    History of echocardiogram    a. Echo 9/16: GLS -15.2%, EF 55-60%, grade 1 diastolic dysfunction, normal wall motion, aortic sclerosis, dilated aortic root 39 mm, atrial septal  lipomatous hypertrophy   Hyperlipidemia    Hypertension    Irritable bowel syndrome (IBS)    Myocardial infarction ([name])    silent inferior MI; patient denies MI history (03/17/13)    Neuralgia    Neuropathy 2013   OSA on CPAP 05/30/2015   Osteoporosis 2013   PAF (paroxysmal atrial fibrillation) ([name])    post CABG 2016; Amiodarone  stopped 2/2 wheezing; PAF 01/12/20   Peripheral vascular disease    Plantar fasciitis 12/15/2011   Pneumonia    Reflux esophagitis    Right bundle branch block 04/30/2015   Sleep apnea    uses CPAP   Stenosis of right internal carotid artery with cerebral infarction ([name]) 12/09/2011   Stress fracture of metatarsal bone 02/18/2012   Stroke ([name]) 1987   Right brain stroke- slight drop on left side   Syncope 01/12/2020   Type 2 diabetes mellitus with vascular disease ([name]) 05/30/2015   Wears glasses    Past Surgical History:  Procedure Laterality Date   ABDOMINAL AORTOGRAM W/LOWER EXTREMITY Bilateral 11/27/2020   Procedure: ABDOMINAL AORTOGRAM W/LOWER EXTREMITY;  Surgeon: Serene Gaile ORN, MD;  Location: [facility] INVASIVE CV LAB;  Service: Cardiovascular;  Laterality: Bilateral;   ANTERIOR CERVICAL DECOMP/DISCECTOMY FUSION N/A 08/25/2023   Procedure: CERVICAL THREE-FOUR, CERVICAL FOUR-FIVE, CERVICAL FIVE-SIX ANTERIOR CERVICAL DECOMPRESSION/DISCECTOMY  FUSION;  Surgeon: Debby Dorn MATSU, MD;  Location: [facility] OR;  Service: Neurosurgery;  Laterality: N/A;   CARDIAC CATHETERIZATION N/A 05/17/2015   Procedure: Left Heart Cath and Coronary Angiography;  Surgeon: Victory LELON Sharps, MD;  Location: [facility] INVASIVE CV LAB;  Service: Cardiovascular;  Laterality: N/A;   CAROTID ENDARTERECTOMY  1994 & redo 2001   Left   CATARACT EXTRACTION W/ INTRAOCULAR LENS IMPLANT Bilateral    COLONOSCOPY W/ BIOPSIES AND POLYPECTOMY     CORONARY ARTERY BYPASS GRAFT N/A 05/23/2015   Procedure: CORONARY ARTERY BYPASS GRAFTING (CABG) x 3 (LIMA to LAD, SVG to DIAGONAL 2, SVG to PDA) with Endoscopic Vein  Harvesting from right greater saphenous vein;  Surgeon: Dallas KATHEE Jude, MD;  Location: [facility] OR;  Service: Open Heart Surgery;  Laterality: N/A;   ENDARTERECTOMY FEMORAL Right 11/30/2020   Procedure: ENDARTERECTOMY FEMORAL RIGHT;  Surgeon: Oris Krystal FALCON, MD;  Location: [facility] OR;  Service: Vascular;  Laterality: Right;   FEMORAL-POPLITEAL BYPASS GRAFT Right 11/30/2020   Procedure: BYPASS GRAFT FEMORAL-POPLITEAL ARTERY RIGHT;  Surgeon: Oris Krystal FALCON, MD;  Location: [facility] OR;  Service: Vascular;  Laterality: Right;   FRACTURE SURGERY  2013   Right   foo  t X's 2   ILIAC ARTERY STENT     LAPAROSCOPIC CHOLECYSTECTOMY  09/11/2016   POSTERIOR LUMBAR FUSION  03/24/2013   Level 1   PR VEIN BYPASS GRAFT,AORTO-FEM-POP  1998   SPINE SURGERY  2014   TEE WITHOUT CARDIOVERSION N/A 05/23/2015   Procedure: TRANSESOPHAGEAL ECHOCARDIOGRAM (TEE);  Surgeon: Dallas KATHEE Jude, MD;  Location: [facility] OR;  Service: Open Heart Surgery;  Laterality: N/A;   TONSILLECTOMY     TRANSCAROTID ARTERY REVASCULARIZATION  Right 06/01/2020   Procedure: RIGHT TRANSCAROTID ARTERY REVASCULARIZATION;  Surgeon: Serene Gaile LELON, MD;  Location: [facility] OR;  Service: Vascular;  Laterality: Right;   TRIGGER FINGER RELEASE Right 07/14/2017   Procedure: RIGHT LONG FINGER TRIGGER RELEASE;  Surgeon: Sebastian Lenis, MD;  Location: [facility];  Service: Orthopedics;  Laterality: Right;   Patient Active Problem List   Diagnosis Date Noted   Cervical myelopathy ([name]) 08/25/2023   Diabetic neuropathy ([name]) 06/03/2022   PAD (peripheral artery disease) 11/30/2020   Carotid stenosis 06/01/2020   Carotid stenosis, asymptomatic 06/01/2020   Syncope 01/12/2020   Coagulation disorder 05/25/2019   Coronary artery disease involving coronary bypass graft of native heart with angina pectoris    PAF (paroxysmal atrial fibrillation) ([name])    Type 2 diabetes mellitus with vascular disease ([name]) 05/30/2015   OSA on CPAP 05/30/2015   Abnormal stress  test 05/18/2015   Diverticulosis of colon without hemorrhage 05/17/2015   Right bundle branch block 04/30/2015   Essential hypertension 04/29/2015   Hyperlipidemia 04/29/2015   DM type 2, controlled, with complication ([name]) 04/29/2015   Peripheral vascular disease 06/08/2012   Stress fracture of metatarsal bone 02/18/2012   Acquired equinus deformity of foot 12/15/2011   Plantar fasciitis 12/15/2011   Stenosis of right internal carotid artery with cerebral infarction ([name]) 12/09/2011      PCP: Carlin Gull MD  REFERRING PROVIDER: Debby Dorn MATSU, MD  REFERRING DIAG: [phone] (ICD-10-CM) - Other spondylosis with myelopathy, cervical region   THERAPY DIAG:  Cervicalgia  Other abnormalities of gait and mobility  Difficulty in walking, not elsewhere classified  Rationale for Evaluation and Treatment: Rehabilitation  ONSET DATE: DOS: 1/14 fusion of c3-c6  SUBJECTIVE:  SUBJECTIVE STATEMENT: Pt reports improved cervical rotation since starting therapy.  Pt reports he didn't sleep well last night. He reports he would like to know what's causing his lower back pain.   PN: The patient has had a progressive onset of balance and gait disturbances. At the start f physical therapy for his neck he was walking with some difficulty but not using a device. At this point he is using a walker for walking even short distances. He feels that on certain days he has difficulty walking even with the walker. He has no significant pain just weakness. He has seen a back doctor who does not feel it is coming from his back. He has also seen a neurologist who also did not have a clear etiology for his weakness. He will see a vascular doctor but not until November.    Eval: Patient has long history of cervical  pain.  He is found to have cervical spinal cord compression.  He has cervical fixation from C3-C6 08/25/2023.  He has increased pain since that point.  He reports pain with starts in his neck and goes down into both shoulders.  He also reports weakness in bilateral upper extremities. He does have intermittent lumbar spine pain but the pain does not correlate to the loss of strength in his legs.     Hand dominance: Right  PERTINENT HISTORY:  Anemia, acquired equinus deformity; CAD< CKD, DMII, IBS, MI, MI, neuralgia, PF, carotid stenosis Stroke, Torn Rotator cuff; CABG; vein bypass graft 1998; Spine surgery in 2014     PAIN:  PAIN:  Are you having pain? Yes: NPRS scale: 5/10 with rotation Pain location: cervical spine into bilateral shoulders,  lower back  Pain description: aching Aggravating factors: UE activity  Relieving factors:  rest  PRECAUTIONS: None  RED FLAGS: None     WEIGHT BEARING RESTRICTIONS: No  FALLS:  Has patient fallen in last 6 months? No    OCCUPATION:  Lives   PLOF: Independent  PATIENT GOALS: get pain down and get stronger  NEXT MD VISIT:   OBJECTIVE:  Note: Objective measures were completed at Evaluation unless otherwise noted.  DIAGNOSTIC FINDINGS:  Surgery: CERVICAL THREE-FOUR, CERVICAL FOUR-FIVE, CERVICAL FIVE-SIX ANTERIOR CERVICAL DECOMPRESSION/DISCECTOMY FUSION   PATIENT SURVEYS:  NDI    COGNITION: Overall cognitive status: Within functional limits for tasks assessed  SENSATION: WFL  POSTURE: rounded shoulders, forward head, and flexed trunk   PALPATION: Spasming in cervical spine and into peri-scapular area  CERVICAL ROM:   Active ROM A/PROM (deg) eval AROM 6/18   Flexion 25   Extension 20   Right lateral flexion 2   Left lateral flexion 2   Right rotation 42 54  Left rotation 40 63   (Blank rows = not tested)  UPPER EXTREMITY ROM:  Active ROM Right eval Left eval  Shoulder flexion 135 120  Shoulder extension     Shoulder abduction 155 145  Shoulder adduction    Shoulder extension    Shoulder internal rotation T12 T12  Shoulder external rotation T1 Base of occiput  Elbow flexion    Elbow extension    Wrist flexion    Wrist extension    Wrist ulnar deviation    Wrist radial deviation    Wrist pronation    Wrist supination     (Blank rows = not tested)  UPPER EXTREMITY MMT:  MMT Right eval Left eval Right  6/18 Left  6/18  Shoulder flexion 4 4- 4+ 4+  Shoulder extension      Shoulder abduction      Shoulder adduction      Shoulder extension      Shoulder internal rotation      Shoulder external rotation      Middle trapezius      Lower trapezius      Elbow flexion      Elbow extension      Wrist flexion      Wrist extension      Wrist ulnar deviation      Wrist radial deviation      Wrist pronation      Wrist supination      Grip strength       (Blank rows = not tested)  Grip test:  R: 50lbs  L: 20lbs  Grip test : 03/15/24 Rt : 45#, 59# Lt: 49#, 52#   LOWER EXTREMITY MMT:    MMT Right Eval 9/3 Left Eval 9/3  Hip flexion 24.3 22.1  Hip extension    Hip abduction 32.2 38.7  Hip adduction    Hip internal rotation    Hip external rotation    Knee flexion 42.9 44.2  Knee extension    Ankle dorsiflexion    Ankle plantarflexion    Ankle inversion    Ankle eversion     (Blank rows = not tested)   CERVICAL SPECIAL TESTS:    FUNCTIONAL TESTS:    Previous balance assessment: Feet together eyes closed: able to complete, moderate swaying Tandem eyes closed: moderate swaying   9/3 Feet together eyes open CGA  Feet together eyes closed: able to complete, moderate assist  Tandem eyes closed: moderate assist      5x sit to stand 24 sec   3 min walk test (with RW):  Made it 2:14 before requiring a seated rest break  Distance 223 feet.   05/06/24 3 min walk test (no AD):  made it 2 min 15 sec, distance 315 ft    BERG Balance Test         Date:  04/18/24  Sit to Stand 4  Standing unsupported 4  Sitting with back unsupported but feet supported 4  Stand to sit  4  Transfers  4  Standing unsupported with eyes closed 1  Standing unsupported feet together 3  From standing position, reach forward with outstretched arm 3  From standing position, pick up object from floor 3  From standing position, turn and look behind over each shoulder 4  Turn 360 4  Standing unsupported, alternately place foot on step 0  Standing unsupported, one foot in front 2  Standing on one leg 1  Total:  40/56        Treatment Date: 05/06/24 Therapeutic Ex:  3 min walk test (see above) with gait belt, no AD (per pt request) STS without use of arms x 5, from standard chair  Neuromuscular re-education: Side stepping L/R on airex beam with light Airex 8 ft each x 2 Narrow base of support on Airex, eyes open x 20sec; eyes closed 5 sec x 3  Forward step up/ retro step back onto Airex x 5, alternating LEs, no UE support Staggered stance with low row with green band x 10 each LE forward  Reaching with UE to cone on eye level shelf, crossing midline, working weight shifting x 12  9/19  Manual:  Trigger point release to upper traps, cervical paraspinals, levator Supine cervical PROM STM to scalenes, cervical ps Suboccipital  release Cervical AROM seated   Neuromuscular reeducation Bilateral ER red 3 x 12 Horizontal abduction 3 x 12 red Airex: Narrow base of support eyes open and eyes closed 3 x 30 seconds each Step on Airex forward and lateral 2 x 10  04/18/24 BERG balance test 40/56 Nu-step L5 Cervical PROM STM cervical paraspinals  9/3  Gait: 3 min walk test   There-act:  5x sit to stand   There-ex: Review of use of nu-step and set up 5 min L5    Manual:  Trigger point release using a tennis ball for lumbar spine pain   Neuro-re-ed Balance testing  8/26 Manual:  Trigger point release to upper traps, cervical paraspinals,  levator Supine cervical PROM STM to scalenes, cervical ps Suboccipital release Cervical AROM seated  Instruction in theracane Wand flexion with cuing for posture x10 1# bar seated Seated scap retraction (cues for correct performance)    8/18 Manual:  Trigger point release to upper traps, cervical paraspinals, levator Supine cervical PROM STM to scalenes, cervical ps Suboccipital release Cervical AROM seated  Wand flexion with cuing for posture x10 1# bar seated Seated scap retraction (cues for correct performance)  Posterior shoulder rolls (attempted, but switched to shrugs instead) x10 Postural awareness education    8/13 Manual:  Trigger point release to upper traps, cervical paraspinals, levator.  neuro-re-ed:  Bilateral er yellow 3x10 with cuing for posture  Horizontal abduction 3x10 with cuing for posture yellow  Wand flexion with cuing for posture 3x10    PATIENT EDUCATION:  Education details: Symptom management, HEP, self care Person educated: Patient Education method: Explanation, Demonstration, Tactile cues, Verbal cues, and Handouts Education comprehension: verbalized understanding and returned demonstration  HOME EXERCISE PROGRAM: [id number]  ASSESSMENT:  CLINICAL IMPRESSION: Patient reported tightness in left upper trap today upon arrival that dissipated after 3 min walk test.  Pt had to stop to rest at 2 min 15 sec with 3 min walk test, but did not use AD this time. Pt walked 2ft more than last attempt. Gait belt was utilized during session with CGA/SBA for safety. He requires frequent standing rest breaks in between exercises due to reported fatigue. Continued work on posture awareness and strengthening during balance exercises. Pt is progressing gradually towards remaining goals.   PN: Patient returns to physical therapy with a prescription for gait disturbance.  He had progressive loss of ability to ambulate over the past 2 to 3 months.  On his  3-minute walk test he was unable to complete without seated rest break.  He presents with significant weakness in bilateral lower extremities.  He has decreased balance compared to previous balance assessment.  He did see a vascular doctor in November.  He would benefit from skilled therapy to improve overall balance and ability to ambulate in the community.  Will perform Berg balance test next visit as time allows. Eval:  Patient is a 78 year old male status post C3-C6 cervical fixation on 08/24/2022.  At this point he presents with limited upper extremity strength, limited cervical mobility, and pain with functional activities.  He has decreased grip strength on the left compared to the right.  He would benefit from skilled therapy to improve ability to use arms for functional activity.  OBJECTIVE IMPAIRMENTS: Abnormal gait, decreased activity tolerance, decreased balance, decreased endurance, difficulty walking, decreased ROM, decreased strength, increased fascial restrictions, increased muscle spasms, postural dysfunction, and pain.   ACTIVITY LIMITATIONS: carrying, lifting, sitting, standing, squatting, transfers, dressing, self feeding, reach over  head, and hygiene/grooming  PARTICIPATION LIMITATIONS: meal prep, cleaning, laundry, driving, shopping, community activity, and yard work  PERSONAL FACTORS: 3+ comorbidities: multi joint OA, low back pain, right leg stent failure  are also affecting patient's functional outcome.   REHAB POTENTIAL: Good  CLINICAL DECISION MAKING: Evolving/moderate complexity  EVALUATION COMPLEXITY: Moderate   GOALS: Goals reviewed with patient? Yes  SHORT TERM GOALS: Target date: 12/29/2023    Patient will increase cervical rotation by 10 degrees  Baseline:  Goal status MET 6/18   2.  Patient will demonstrate full flexion of bilateral shoulders  Baseline:  Goal status: improving 6/18   3.  Patient will increase grip strength by 10 lbs  Baseline: see  above Goal status:  Partially met - 03/15/24  4. Therapy will perform BErG balance testing on the patient  MET 9/8 (40/56)    5. Patient will increase 3 min walk test distance to 300; 9/4 new goal      LONG TERM GOALS: Target date: POC date    Patient will perform ADL's without neck pain  Baseline:  Goal status: In progress 8/5  2.  Patient will demonstrate 50 degrees of cervical rotation in order to drive  Baseline:  Goal status: MET  6/18    3.  Patient will demonstrate good functional balance in order to improve safety  Baseline:  Goal status: still limited   4. patient will ambulate through a 6 min walk test without a seated rest break. Goal:  9/4  5. Patient will demonstrate good static standing balance in order to increase safety.  Goal: 9/4       PLAN:  PT FREQUENCY: 1-2x/week  PT DURATION: 8 weeks  PLANNED INTERVENTIONS:   PLAN FOR NEXT SESSION: Consider trigger point release to bilateral upper trap, cervical paraspinals, and parascapular muscles.  Continue with postural exercises.  Delon Aquas, PTA 05/06/24 5:00 PM [facility] [address] [place], [place], [address] Phone: [phone]   Fax:  [phone]

## 2024-05-10 ENCOUNTER — Ambulatory Visit (HOSPITAL_BASED_OUTPATIENT_CLINIC_OR_DEPARTMENT_OTHER): Admitting: Physical Therapy

## 2024-05-10 ENCOUNTER — Encounter (HOSPITAL_BASED_OUTPATIENT_CLINIC_OR_DEPARTMENT_OTHER): Payer: Self-pay | Admitting: Physical Therapy

## 2024-05-10 DIAGNOSIS — R262 Difficulty in walking, not elsewhere classified: Secondary | ICD-10-CM

## 2024-05-10 DIAGNOSIS — R2689 Other abnormalities of gait and mobility: Secondary | ICD-10-CM

## 2024-05-10 DIAGNOSIS — M5459 Other low back pain: Secondary | ICD-10-CM

## 2024-05-10 DIAGNOSIS — M542 Cervicalgia: Secondary | ICD-10-CM

## 2024-05-10 NOTE — Therapy (Signed)
 OUTPATIENT PHYSICAL THERAPY  TREATMENT   Patient Name: Bradley [name] MRN: [id number] DOB:01/05/1946, 78 y.o., male Today's Date: 05/10/2024  END OF SESSION:  PT End of Session - 05/10/24 1252     Visit Number 17    Number of Visits 29    Date for Recertification  06/09/24    Authorization Type Re eval done at visit 13    Progress Note Due on Visit 23    PT Start Time 1233    PT Stop Time 1311    PT Time Calculation (min) 38 min    Activity Tolerance Patient tolerated treatment well    Behavior During Therapy [facility] for tasks assessed/performed              Past Medical History:  Diagnosis Date   Acquired equinus deformity of foot 12/15/2011   Anemia    Arthritis    Carotid artery occlusion    a. Carotid US  10/16: RICA 60-79%, L CEA patent with 1-39% stenosis   CKD (chronic kidney disease)    stage 3-4   Coagulation disorder 05/25/2019   Coronary artery disease    a. Myoview  9/16:  EF 52%, inferior fixed defect consistent with diaphragmatic attenuation, intermediate risk secondary to poor exercise tolerance and symptoms during stress;  b. LHC 05/17/2015 90% mid LAD, 80% ost D2, 75% mid RCA, 35% prox RCA. >> S/p CABG   Coronary artery disease involving coronary bypass graft of native heart with angina pectoris    CORONARY ARTERY BYPASS GRAFTING x 3 - 05/23/2015 Left internal mammary artery to left anterior descending  Saphenous vein graft to diagonal  Saphenous vein graft to posterior descending Endoscopic greater saphenous vein harvest right thigh     Developmental delay    Diabetes mellitus age 22   Diverticulitis    Diverticulosis of colon without hemorrhage 05/17/2015   By CT scan    DM type 2, controlled, with complication ([name]) 04/29/2015   Essential hypertension 04/29/2015   GERD (gastroesophageal reflux disease)    H/O hiatal hernia    History of echocardiogram    a. Echo 9/16: GLS -15.2%, EF 55-60%, grade 1 diastolic dysfunction, normal wall motion, aortic  sclerosis, dilated aortic root 39 mm, atrial septal lipomatous hypertrophy   Hyperlipidemia    Hypertension    Irritable bowel syndrome (IBS)    Myocardial infarction ([name])    silent inferior MI; patient denies MI history (03/17/13)    Neuralgia    Neuropathy 2013   OSA on CPAP 05/30/2015   Osteoporosis 2013   PAF (paroxysmal atrial fibrillation) ([name])    post CABG 2016; Amiodarone  stopped 2/2 wheezing; PAF 01/12/20   Peripheral vascular disease    Plantar fasciitis 12/15/2011   Pneumonia    Reflux esophagitis    Right bundle branch block 04/30/2015   Sleep apnea    uses CPAP   Stenosis of right internal carotid artery with cerebral infarction ([name]) 12/09/2011   Stress fracture of metatarsal bone 02/18/2012   Stroke ([name]) 1987   Right brain stroke- slight drop on left side   Syncope 01/12/2020   Type 2 diabetes mellitus with vascular disease ([name]) 05/30/2015   Wears glasses    Past Surgical History:  Procedure Laterality Date   ABDOMINAL AORTOGRAM W/LOWER EXTREMITY Bilateral 11/27/2020   Procedure: ABDOMINAL AORTOGRAM W/LOWER EXTREMITY;  Surgeon: Serene Gaile ORN, MD;  Location: [facility] INVASIVE CV LAB;  Service: Cardiovascular;  Laterality: Bilateral;   ANTERIOR CERVICAL DECOMP/DISCECTOMY FUSION N/A 08/25/2023   Procedure:  CERVICAL THREE-FOUR, CERVICAL FOUR-FIVE, CERVICAL FIVE-SIX ANTERIOR CERVICAL DECOMPRESSION/DISCECTOMY FUSION;  Surgeon: Debby Dorn MATSU, MD;  Location: [facility] OR;  Service: Neurosurgery;  Laterality: N/A;   CARDIAC CATHETERIZATION N/A 05/17/2015   Procedure: Left Heart Cath and Coronary Angiography;  Surgeon: Victory LELON Sharps, MD;  Location: [facility] INVASIVE CV LAB;  Service: Cardiovascular;  Laterality: N/A;   CAROTID ENDARTERECTOMY  1994 & redo 2001   Left   CATARACT EXTRACTION W/ INTRAOCULAR LENS IMPLANT Bilateral    COLONOSCOPY W/ BIOPSIES AND POLYPECTOMY     CORONARY ARTERY BYPASS GRAFT N/A 05/23/2015   Procedure: CORONARY ARTERY BYPASS GRAFTING (CABG) x 3 (LIMA to LAD,  SVG to DIAGONAL 2, SVG to PDA) with Endoscopic Vein Harvesting from right greater saphenous vein;  Surgeon: Dallas KATHEE Jude, MD;  Location: [facility] OR;  Service: Open Heart Surgery;  Laterality: N/A;   ENDARTERECTOMY FEMORAL Right 11/30/2020   Procedure: ENDARTERECTOMY FEMORAL RIGHT;  Surgeon: Oris Krystal FALCON, MD;  Location: [facility] OR;  Service: Vascular;  Laterality: Right;   FEMORAL-POPLITEAL BYPASS GRAFT Right 11/30/2020   Procedure: BYPASS GRAFT FEMORAL-POPLITEAL ARTERY RIGHT;  Surgeon: Oris Krystal FALCON, MD;  Location: [facility] OR;  Service: Vascular;  Laterality: Right;   FRACTURE SURGERY  2013   Right   foo  t X's 2   ILIAC ARTERY STENT     LAPAROSCOPIC CHOLECYSTECTOMY  09/11/2016   POSTERIOR LUMBAR FUSION  03/24/2013   Level 1   PR VEIN BYPASS GRAFT,AORTO-FEM-POP  1998   SPINE SURGERY  2014   TEE WITHOUT CARDIOVERSION N/A 05/23/2015   Procedure: TRANSESOPHAGEAL ECHOCARDIOGRAM (TEE);  Surgeon: Dallas KATHEE Jude, MD;  Location: [facility] OR;  Service: Open Heart Surgery;  Laterality: N/A;   TONSILLECTOMY     TRANSCAROTID ARTERY REVASCULARIZATION  Right 06/01/2020   Procedure: RIGHT TRANSCAROTID ARTERY REVASCULARIZATION;  Surgeon: Serene Gaile LELON, MD;  Location: [facility] OR;  Service: Vascular;  Laterality: Right;   TRIGGER FINGER RELEASE Right 07/14/2017   Procedure: RIGHT LONG FINGER TRIGGER RELEASE;  Surgeon: Sebastian Lenis, MD;  Location: [facility];  Service: Orthopedics;  Laterality: Right;   Patient Active Problem List   Diagnosis Date Noted   Cervical myelopathy ([name]) 08/25/2023   Diabetic neuropathy ([name]) 06/03/2022   PAD (peripheral artery disease) 11/30/2020   Carotid stenosis 06/01/2020   Carotid stenosis, asymptomatic 06/01/2020   Syncope 01/12/2020   Coagulation disorder 05/25/2019   Coronary artery disease involving coronary bypass graft of native heart with angina pectoris    PAF (paroxysmal atrial fibrillation) ([name])    Type 2 diabetes mellitus with vascular disease ([name])  05/30/2015   OSA on CPAP 05/30/2015   Abnormal stress test 05/18/2015   Diverticulosis of colon without hemorrhage 05/17/2015   Right bundle branch block 04/30/2015   Essential hypertension 04/29/2015   Hyperlipidemia 04/29/2015   DM type 2, controlled, with complication ([name]) 04/29/2015   Peripheral vascular disease 06/08/2012   Stress fracture of metatarsal bone 02/18/2012   Acquired equinus deformity of foot 12/15/2011   Plantar fasciitis 12/15/2011   Stenosis of right internal carotid artery with cerebral infarction ([name]) 12/09/2011      PCP: Carlin Gull MD  REFERRING PROVIDER: Debby Dorn MATSU, MD  REFERRING DIAG: [phone] (ICD-10-CM) - Other spondylosis with myelopathy, cervical region   THERAPY DIAG:  Cervicalgia  Other abnormalities of gait and mobility  Difficulty in walking, not elsewhere classified  Other low back pain  Rationale for Evaluation and Treatment: Rehabilitation  ONSET DATE: DOS: 1/14 fusion of c3-c6  SUBJECTIVE:  SUBJECTIVE STATEMENT: Pt reports no changes since last visit.  Rt side of neck has been more painful. He brought his rollator in today; he verbalizes that it is too short, but he is still looking for one that is right size.   PN: The patient has had a progressive onset of balance and gait disturbances. At the start f physical therapy for his neck he was walking with some difficulty but not using a device. At this point he is using a walker for walking even short distances. He feels that on certain days he has difficulty walking even with the walker. He has no significant pain just weakness. He has seen a back doctor who does not feel it is coming from his back. He has also seen a neurologist who also did not have a clear etiology for his weakness.  He will see a vascular doctor but not until November.    Eval: Patient has long history of cervical pain.  He is found to have cervical spinal cord compression.  He has cervical fixation from C3-C6 08/25/2023.  He has increased pain since that point.  He reports pain with starts in his neck and goes down into both shoulders.  He also reports weakness in bilateral upper extremities. He does have intermittent lumbar spine pain but the pain does not correlate to the loss of strength in his legs.     Hand dominance: Right  PERTINENT HISTORY:  Anemia, acquired equinus deformity; CAD< CKD, DMII, IBS, MI, MI, neuralgia, PF, carotid stenosis Stroke, Torn Rotator cuff; CABG; vein bypass graft 1998; Spine surgery in 2014     PAIN:  PAIN:  Are you having pain? Yes: NPRS scale: 4/10 with rotation Pain location: cervical spine into bilateral shoulders,  lower back  Pain description: aching Aggravating factors: UE activity  Relieving factors:  rest  PRECAUTIONS: None  RED FLAGS: None     WEIGHT BEARING RESTRICTIONS: No  FALLS:  Has patient fallen in last 6 months? No    OCCUPATION:  Lives   PLOF: Independent  PATIENT GOALS: get pain down and get stronger  NEXT MD VISIT:   OBJECTIVE:  Note: Objective measures were completed at Evaluation unless otherwise noted.  DIAGNOSTIC FINDINGS:  Surgery: CERVICAL THREE-FOUR, CERVICAL FOUR-FIVE, CERVICAL FIVE-SIX ANTERIOR CERVICAL DECOMPRESSION/DISCECTOMY FUSION   PATIENT SURVEYS:  NDI    COGNITION: Overall cognitive status: Within functional limits for tasks assessed  SENSATION: WFL  POSTURE: rounded shoulders, forward head, and flexed trunk   PALPATION: Spasming in cervical spine and into peri-scapular area  CERVICAL ROM:   Active ROM A/PROM (deg) eval AROM 6/18   Flexion 25   Extension 20   Right lateral flexion 2   Left lateral flexion 2   Right rotation 42 54  Left rotation 40 63   (Blank rows = not tested)  UPPER  EXTREMITY ROM:  Active ROM Right eval Left eval  Shoulder flexion 135 120  Shoulder extension    Shoulder abduction 155 145  Shoulder adduction    Shoulder extension    Shoulder internal rotation T12 T12  Shoulder external rotation T1 Base of occiput  Elbow flexion    Elbow extension    Wrist flexion    Wrist extension    Wrist ulnar deviation    Wrist radial deviation    Wrist pronation    Wrist supination     (Blank rows = not tested)  UPPER EXTREMITY MMT:  MMT Right eval Left eval Right  6/18 Left  6/18  Shoulder flexion 4 4- 4+ 4+  Shoulder extension      Shoulder abduction      Shoulder adduction      Shoulder extension      Shoulder internal rotation      Shoulder external rotation      Middle trapezius      Lower trapezius      Elbow flexion      Elbow extension      Wrist flexion      Wrist extension      Wrist ulnar deviation      Wrist radial deviation      Wrist pronation      Wrist supination      Grip strength       (Blank rows = not tested)  Grip test:  R: 50lbs  L: 20lbs  Grip test : 03/15/24 Rt : 45#, 59# Lt: 49#, 52#  Grip test: 05/10/24: Rt: 41#, 54# Lt: 40#, 48#  LOWER EXTREMITY MMT:    MMT Right Eval 9/3 Left Eval 9/3  Hip flexion 24.3 22.1  Hip extension    Hip abduction 32.2 38.7  Hip adduction    Hip internal rotation    Hip external rotation    Knee flexion 42.9 44.2  Knee extension    Ankle dorsiflexion    Ankle plantarflexion    Ankle inversion    Ankle eversion     (Blank rows = not tested)   CERVICAL SPECIAL TESTS:    FUNCTIONAL TESTS:    Previous balance assessment: Feet together eyes closed: able to complete, moderate swaying Tandem eyes closed: moderate swaying   9/3 Feet together eyes open CGA  Feet together eyes closed: able to complete, moderate assist  Tandem eyes closed: moderate assist      5x sit to stand 24 sec   3 min walk test (with RW):  Made it 2:14 before requiring a seated  rest break  Distance 223 feet.   05/06/24 3 min walk test (no AD):  made it 2 min 15 sec, distance 315 ft   05/10/25 6 min walk test ( with rollator):  made it 3 min,48 sec; distance 516 feet   BERG Balance Test         Date: 04/18/24  Sit to Stand 4  Standing unsupported 4  Sitting with back unsupported but feet supported 4  Stand to sit  4  Transfers  4  Standing unsupported with eyes closed 1  Standing unsupported feet together 3  From standing position, reach forward with outstretched arm 3  From standing position, pick up object from floor 3  From standing position, turn and look behind over each shoulder 4  Turn 360 4  Standing unsupported, alternately place foot on step 0  Standing unsupported, one foot in front 2  Standing on one leg 1  Total:  40/56        Treatment Date: 05/10/24 Manual therapy:   STM to bil upper trap, Rt scalenes, levator scapula  Neuro re-ed:   lateral step up onto Airex x 5 each direction - cues for posture, light UE on counter Staggered stance with low row with green band x 10 each LE forward Bow and arrow with same side step back and blue band x 5 each side (challenge with balance) Marching on airex pad  Therapeutic Exercise:  6 min walk test (see above)   05/06/24 Therapeutic Ex:  3 min walk test (see above) with  gait belt, no AD (per pt request) STS without use of arms x 5, from standard chair  Neuromuscular re-education: Side stepping L/R on airex beam with light Airex 8 ft each x 2 Narrow base of support on Airex, eyes open x 20sec; eyes closed 5 sec x 3  Forward step up/ retro step back onto Airex x 5, alternating LEs, no UE support Staggered stance with low row with green band x 10 each LE forward  Reaching with UE to cone on eye level shelf, crossing midline, working weight shifting x 12  9/19  Manual:  Trigger point release to upper traps, cervical paraspinals, levator Supine cervical PROM STM to scalenes, cervical  ps Suboccipital release Cervical AROM seated   Neuromuscular reeducation Bilateral ER red 3 x 12 Horizontal abduction 3 x 12 red Airex: Narrow base of support eyes open and eyes closed 3 x 30 seconds each Step on Airex forward and lateral 2 x 10  04/18/24 BERG balance test 40/56 Nu-step L5 Cervical PROM STM cervical paraspinals  9/3  Gait: 3 min walk test   There-act:  5x sit to stand   There-ex: Review of use of nu-step and set up 5 min L5    Manual:  Trigger point release using a tennis ball for lumbar spine pain   Neuro-re-ed Balance testing  8/26 Manual:  Trigger point release to upper traps, cervical paraspinals, levator Supine cervical PROM STM to scalenes, cervical ps Suboccipital release Cervical AROM seated  Instruction in theracane Wand flexion with cuing for posture x10 1# bar seated Seated scap retraction (cues for correct performance)    8/18 Manual:  Trigger point release to upper traps, cervical paraspinals, levator Supine cervical PROM STM to scalenes, cervical ps Suboccipital release Cervical AROM seated  Wand flexion with cuing for posture x10 1# bar seated Seated scap retraction (cues for correct performance)  Posterior shoulder rolls (attempted, but switched to shrugs instead) x10 Postural awareness education    8/13 Manual:  Trigger point release to upper traps, cervical paraspinals, levator.  neuro-re-ed:  Bilateral er yellow 3x10 with cuing for posture  Horizontal abduction 3x10 with cuing for posture yellow  Wand flexion with cuing for posture 3x10    PATIENT EDUCATION:  Education details: Symptom management, HEP, self care Person educated: Patient Education method: Explanation, Demonstration, Tactile cues, Verbal cues, and Handouts Education comprehension: verbalized understanding and returned demonstration  HOME EXERCISE PROGRAM: [id number]  ASSESSMENT:  CLINICAL IMPRESSION: Repeated walk test:  Pt had to  stop to rest at 3 min 48 sec and made it 516 feet, with UE support on rollator.  Gait belt was utilized during session with CGA/SBA for safety. He requires frequent standing rest breaks in between exercises due to reported fatigue. Continued work on posture awareness and strengthening during balance exercises. Pt is progressing gradually towards remaining goals.   PN: Patient returns to physical therapy with a prescription for gait disturbance.  He had progressive loss of ability to ambulate over the past 2 to 3 months.  On his 3-minute walk test he was unable to complete without seated rest break.  He presents with significant weakness in bilateral lower extremities.  He has decreased balance compared to previous balance assessment.  He did see a vascular doctor in November.  He would benefit from skilled therapy to improve overall balance and ability to ambulate in the community.  Will perform Berg balance test next visit as time allows. Eval:  Patient is a 78 year old male status  post C3-C6 cervical fixation on 08/24/2022.  At this point he presents with limited upper extremity strength, limited cervical mobility, and pain with functional activities.  He has decreased grip strength on the left compared to the right.  He would benefit from skilled therapy to improve ability to use arms for functional activity.  OBJECTIVE IMPAIRMENTS: Abnormal gait, decreased activity tolerance, decreased balance, decreased endurance, difficulty walking, decreased ROM, decreased strength, increased fascial restrictions, increased muscle spasms, postural dysfunction, and pain.   ACTIVITY LIMITATIONS: carrying, lifting, sitting, standing, squatting, transfers, dressing, self feeding, reach over head, and hygiene/grooming  PARTICIPATION LIMITATIONS: meal prep, cleaning, laundry, driving, shopping, community activity, and yard work  PERSONAL FACTORS: 3+ comorbidities: multi joint OA, low back pain, right leg stent failure   are also affecting patient's functional outcome.   REHAB POTENTIAL: Good  CLINICAL DECISION MAKING: Evolving/moderate complexity  EVALUATION COMPLEXITY: Moderate   GOALS: Goals reviewed with patient? Yes  SHORT TERM GOALS: Target date: 12/29/2023    Patient will increase cervical rotation by 10 degrees  Baseline:  Goal status MET 6/18   2.  Patient will demonstrate full flexion of bilateral shoulders  Baseline:  Goal status: improving 6/18   3.  Patient will increase grip strength by 10 lbs  Baseline: see above Goal status:  Partially met - 03/15/24  4. Therapy will perform BErG balance testing on the patient  MET 9/8 (40/56)    5. Patient will increase 3 min walk test distance to 300; 9/4 new goal      LONG TERM GOALS: Target date: POC date    Patient will perform ADL's without neck pain  Baseline:  Goal status: In progress 8/5  2.  Patient will demonstrate 50 degrees of cervical rotation in order to drive  Baseline:  Goal status: MET  6/18    3.  Patient will demonstrate good functional balance in order to improve safety  Baseline:  Goal status: still limited   4. patient will ambulate through a 6 min walk test without a seated rest break. Goal:  9/4  5. Patient will demonstrate good static standing balance in order to increase safety.  Goal: 9/4       PLAN:  PT FREQUENCY: 1-2x/week  PT DURATION: 8 weeks  PLANNED INTERVENTIONS:   PLAN FOR NEXT SESSION: Consider trigger point release to bilateral upper trap, cervical paraspinals, and parascapular muscles.  Continue with postural exercises.  Delon Aquas, PTA 05/10/24 6:16 PM [facility] [address] [place], [place], [address] Phone: [phone]   Fax:  [phone]

## 2024-05-13 ENCOUNTER — Encounter (HOSPITAL_BASED_OUTPATIENT_CLINIC_OR_DEPARTMENT_OTHER): Payer: Self-pay

## 2024-05-13 ENCOUNTER — Ambulatory Visit (HOSPITAL_BASED_OUTPATIENT_CLINIC_OR_DEPARTMENT_OTHER): Attending: Neurosurgery

## 2024-05-13 DIAGNOSIS — R2689 Other abnormalities of gait and mobility: Secondary | ICD-10-CM | POA: Diagnosis not present

## 2024-05-13 DIAGNOSIS — M5459 Other low back pain: Secondary | ICD-10-CM | POA: Diagnosis not present

## 2024-05-13 DIAGNOSIS — R262 Difficulty in walking, not elsewhere classified: Secondary | ICD-10-CM | POA: Insufficient documentation

## 2024-05-13 DIAGNOSIS — M542 Cervicalgia: Secondary | ICD-10-CM | POA: Diagnosis not present

## 2024-05-13 NOTE — Therapy (Signed)
 OUTPATIENT PHYSICAL THERAPY  TREATMENT   Patient Name: Bradley [name] MRN: [id number] DOB:July 07, 1946, 78 y.o., male Today's Date: 05/13/2024  END OF SESSION:  PT End of Session - 05/13/24 1547     Visit Number 18    Number of Visits 29    Date for Recertification  06/09/24    Authorization Type Re eval done at visit 13    Progress Note Due on Visit 23    PT Start Time 1300    PT Stop Time 1345    PT Time Calculation (min) 45 min    Activity Tolerance Patient tolerated treatment well    Behavior During Therapy [facility] for tasks assessed/performed               Past Medical History:  Diagnosis Date   Acquired equinus deformity of foot 12/15/2011   Anemia    Arthritis    Carotid artery occlusion    a. Carotid US  10/16: RICA 60-79%, L CEA patent with 1-39% stenosis   CKD (chronic kidney disease)    stage 3-4   Coagulation disorder 05/25/2019   Coronary artery disease    a. Myoview  9/16:  EF 52%, inferior fixed defect consistent with diaphragmatic attenuation, intermediate risk secondary to poor exercise tolerance and symptoms during stress;  b. LHC 05/17/2015 90% mid LAD, 80% ost D2, 75% mid RCA, 35% prox RCA. >> S/p CABG   Coronary artery disease involving coronary bypass graft of native heart with angina pectoris    CORONARY ARTERY BYPASS GRAFTING x 3 - 05/23/2015 Left internal mammary artery to left anterior descending  Saphenous vein graft to diagonal  Saphenous vein graft to posterior descending Endoscopic greater saphenous vein harvest right thigh     Developmental delay    Diabetes mellitus age 72   Diverticulitis    Diverticulosis of colon without hemorrhage 05/17/2015   By CT scan    DM type 2, controlled, with complication ([name]) 04/29/2015   Essential hypertension 04/29/2015   GERD (gastroesophageal reflux disease)    H/O hiatal hernia    History of echocardiogram    a. Echo 9/16: GLS -15.2%, EF 55-60%, grade 1 diastolic dysfunction, normal wall motion, aortic  sclerosis, dilated aortic root 39 mm, atrial septal lipomatous hypertrophy   Hyperlipidemia    Hypertension    Irritable bowel syndrome (IBS)    Myocardial infarction ([name])    silent inferior MI; patient denies MI history (03/17/13)    Neuralgia    Neuropathy 2013   OSA on CPAP 05/30/2015   Osteoporosis 2013   PAF (paroxysmal atrial fibrillation) ([name])    post CABG 2016; Amiodarone  stopped 2/2 wheezing; PAF 01/12/20   Peripheral vascular disease    Plantar fasciitis 12/15/2011   Pneumonia    Reflux esophagitis    Right bundle branch block 04/30/2015   Sleep apnea    uses CPAP   Stenosis of right internal carotid artery with cerebral infarction ([name]) 12/09/2011   Stress fracture of metatarsal bone 02/18/2012   Stroke ([name]) 1987   Right brain stroke- slight drop on left side   Syncope 01/12/2020   Type 2 diabetes mellitus with vascular disease ([name]) 05/30/2015   Wears glasses    Past Surgical History:  Procedure Laterality Date   ABDOMINAL AORTOGRAM W/LOWER EXTREMITY Bilateral 11/27/2020   Procedure: ABDOMINAL AORTOGRAM W/LOWER EXTREMITY;  Surgeon: Serene Gaile ORN, MD;  Location: [facility] INVASIVE CV LAB;  Service: Cardiovascular;  Laterality: Bilateral;   ANTERIOR CERVICAL DECOMP/DISCECTOMY FUSION N/A 08/25/2023  Procedure: CERVICAL THREE-FOUR, CERVICAL FOUR-FIVE, CERVICAL FIVE-SIX ANTERIOR CERVICAL DECOMPRESSION/DISCECTOMY FUSION;  Surgeon: Debby Dorn MATSU, MD;  Location: [facility] OR;  Service: Neurosurgery;  Laterality: N/A;   CARDIAC CATHETERIZATION N/A 05/17/2015   Procedure: Left Heart Cath and Coronary Angiography;  Surgeon: Victory LELON Sharps, MD;  Location: [facility] INVASIVE CV LAB;  Service: Cardiovascular;  Laterality: N/A;   CAROTID ENDARTERECTOMY  1994 & redo 2001   Left   CATARACT EXTRACTION W/ INTRAOCULAR LENS IMPLANT Bilateral    COLONOSCOPY W/ BIOPSIES AND POLYPECTOMY     CORONARY ARTERY BYPASS GRAFT N/A 05/23/2015   Procedure: CORONARY ARTERY BYPASS GRAFTING (CABG) x 3 (LIMA to LAD,  SVG to DIAGONAL 2, SVG to PDA) with Endoscopic Vein Harvesting from right greater saphenous vein;  Surgeon: Dallas KATHEE Jude, MD;  Location[facility] OR;  Service: Open Heart Surgery;  Laterality: N/A;   ENDARTERECTOMY FEMORAL Right 11/30/2020   Procedure: ENDARTERECTOMY FEMORAL RIGHT;  Surgeon: Oris Krystal FALCON, MD;  Location: [facility] OR;  Service: Vascular;  Laterality: Right;   FEMORAL-POPLITEAL BYPASS GRAFT Right 11/30/2020   Procedure: BYPASS GRAFT FEMORAL-POPLITEAL ARTERY RIGHT;  Surgeon: Oris Krystal FALCON, MD;  Location: [facility] OR;  Service: Vascular;  Laterality: Right;   FRACTURE SURGERY  2013   Right   foo  t X's 2   ILIAC ARTERY STENT     LAPAROSCOPIC CHOLECYSTECTOMY  09/11/2016   POSTERIOR LUMBAR FUSION  03/24/2013   Level 1   PR VEIN BYPASS GRAFT,AORTO-FEM-POP  1998   SPINE SURGERY  2014   TEE WITHOUT CARDIOVERSION N/A 05/23/2015   Procedure: TRANSESOPHAGEAL ECHOCARDIOGRAM (TEE);  Surgeon: Dallas KATHEE Jude, MD;  Location: [facility] OR;  Service: Open Heart Surgery;  Laterality: N/A;   TONSILLECTOMY     TRANSCAROTID ARTERY REVASCULARIZATION  Right 06/01/2020   Procedure: RIGHT TRANSCAROTID ARTERY REVASCULARIZATION;  Surgeon: Serene Gaile LELON, MD;  Location: [facility] OR;  Service: Vascular;  Laterality: Right;   TRIGGER FINGER RELEASE Right 07/14/2017   Procedure: RIGHT LONG FINGER TRIGGER RELEASE;  Surgeon: Sebastian Lenis, MD;  Location: [facility];  Service: Orthopedics;  Laterality: Right;   Patient Active Problem List   Diagnosis Date Noted   Cervical myelopathy ([name]) 08/25/2023   Diabetic neuropathy ([name]) 06/03/2022   PAD (peripheral artery disease) 11/30/2020   Carotid stenosis 06/01/2020   Carotid stenosis, asymptomatic 06/01/2020   Syncope 01/12/2020   Coagulation disorder 05/25/2019   Coronary artery disease involving coronary bypass graft of native heart with angina pectoris    PAF (paroxysmal atrial fibrillation) ([name])    Type 2 diabetes mellitus with vascular disease ([name])  05/30/2015   OSA on CPAP 05/30/2015   Abnormal stress test 05/18/2015   Diverticulosis of colon without hemorrhage 05/17/2015   Right bundle branch block 04/30/2015   Essential hypertension 04/29/2015   Hyperlipidemia 04/29/2015   DM type 2, controlled, with complication ([name]) 04/29/2015   Peripheral vascular disease 06/08/2012   Stress fracture of metatarsal bone 02/18/2012   Acquired equinus deformity of foot 12/15/2011   Plantar fasciitis 12/15/2011   Stenosis of right internal carotid artery with cerebral infarction ([name]) 12/09/2011      PCP: Carlin Gull MD  REFERRING PROVIDER: Debby Dorn MATSU, MD  REFERRING DIAG: [phone] (ICD-10-CM) - Other spondylosis with myelopathy, cervical region   THERAPY DIAG:  Cervicalgia  Other abnormalities of gait and mobility  Difficulty in walking, not elsewhere classified  Other low back pain  Rationale for Evaluation and Treatment: Rehabilitation  ONSET DATE: DOS: 1/14 fusion of c3-c6  SUBJECTIVE:                                                                                                                                                                                                         SUBJECTIVE STATEMENT: Pt reports his neck doesn't bother him at rest, but hurts when turning head to the right. Pt reports in the last few days, his legs have been feeling very weak when he gets out of bed. Has to sit on EOB for awhile. My left ear feels clogged too.  PN: The patient has had a progressive onset of balance and gait disturbances. At the start f physical therapy for his neck he was walking with some difficulty but not using a device. At this point he is using a walker for walking even short distances. He feels that on certain days he has difficulty walking even with the walker. He has no significant pain just weakness. He has seen a back doctor who does not feel it is coming from his back. He has also seen a neurologist who also  did not have a clear etiology for his weakness. He will see a vascular doctor but not until November.    Eval: Patient has long history of cervical pain.  He is found to have cervical spinal cord compression.  He has cervical fixation from C3-C6 08/25/2023.  He has increased pain since that point.  He reports pain with starts in his neck and goes down into both shoulders.  He also reports weakness in bilateral upper extremities. He does have intermittent lumbar spine pain but the pain does not correlate to the loss of strength in his legs.     Hand dominance: Right  PERTINENT HISTORY:  Anemia, acquired equinus deformity; CAD< CKD, DMII, IBS, MI, MI, neuralgia, PF, carotid stenosis Stroke, Torn Rotator cuff; CABG; vein bypass graft 1998; Spine surgery in 2014     PAIN:  PAIN:  Are you having pain? Yes: NPRS scale: 4/10 with rotation Pain location: cervical spine into bilateral shoulders,  lower back  Pain description: aching Aggravating factors: UE activity  Relieving factors:  rest  PRECAUTIONS: None  RED FLAGS: None     WEIGHT BEARING RESTRICTIONS: No  FALLS:  Has patient fallen in last 6 months? No    OCCUPATION:  Lives   PLOF: Independent  PATIENT GOALS: get pain down and get stronger  NEXT MD VISIT:   OBJECTIVE:  Note: Objective measures were completed at Evaluation unless otherwise noted.  DIAGNOSTIC FINDINGS:  Surgery: CERVICAL THREE-FOUR, CERVICAL FOUR-FIVE, CERVICAL FIVE-SIX  ANTERIOR CERVICAL DECOMPRESSION/DISCECTOMY FUSION   PATIENT SURVEYS:  NDI    COGNITION: Overall cognitive status: Within functional limits for tasks assessed  SENSATION: WFL  POSTURE: rounded shoulders, forward head, and flexed trunk   PALPATION: Spasming in cervical spine and into peri-scapular area  CERVICAL ROM:   Active ROM A/PROM (deg) eval AROM 6/18   Flexion 25   Extension 20   Right lateral flexion 2   Left lateral flexion 2   Right rotation 42 54  Left  rotation 40 63   (Blank rows = not tested)  UPPER EXTREMITY ROM:  Active ROM Right eval Left eval  Shoulder flexion 135 120  Shoulder extension    Shoulder abduction 155 145  Shoulder adduction    Shoulder extension    Shoulder internal rotation T12 T12  Shoulder external rotation T1 Base of occiput  Elbow flexion    Elbow extension    Wrist flexion    Wrist extension    Wrist ulnar deviation    Wrist radial deviation    Wrist pronation    Wrist supination     (Blank rows = not tested)  UPPER EXTREMITY MMT:  MMT Right eval Left eval Right  6/18 Left  6/18  Shoulder flexion 4 4- 4+ 4+  Shoulder extension      Shoulder abduction      Shoulder adduction      Shoulder extension      Shoulder internal rotation      Shoulder external rotation      Middle trapezius      Lower trapezius      Elbow flexion      Elbow extension      Wrist flexion      Wrist extension      Wrist ulnar deviation      Wrist radial deviation      Wrist pronation      Wrist supination      Grip strength       (Blank rows = not tested)  Grip test:  R: 50lbs  L: 20lbs  Grip test : 03/15/24 Rt : 45#, 59# Lt: 49#, 52#  Grip test: 05/10/24: Rt: 41#, 54# Lt: 40#, 48#  LOWER EXTREMITY MMT:    MMT Right Eval 9/3 Left Eval 9/3  Hip flexion 24.3 22.1  Hip extension    Hip abduction 32.2 38.7  Hip adduction    Hip internal rotation    Hip external rotation    Knee flexion 42.9 44.2  Knee extension    Ankle dorsiflexion    Ankle plantarflexion    Ankle inversion    Ankle eversion     (Blank rows = not tested)   CERVICAL SPECIAL TESTS:    FUNCTIONAL TESTS:    Previous balance assessment: Feet together eyes closed: able to complete, moderate swaying Tandem eyes closed: moderate swaying   9/3 Feet together eyes open CGA  Feet together eyes closed: able to complete, moderate assist  Tandem eyes closed: moderate assist      5x sit to stand 24 sec   3 min walk test  (with RW):  Made it 2:14 before requiring a seated rest break  Distance 223 feet.   05/06/24 3 min walk test (no AD):  made it 2 min 15 sec, distance 315 ft   05/10/25 6 min walk test ( with rollator):  made it 3 min,48 sec; distance 516 feet   BERG Balance Test  Date: 04/18/24  Sit to Stand 4  Standing unsupported 4  Sitting with back unsupported but feet supported 4  Stand to sit  4  Transfers  4  Standing unsupported with eyes closed 1  Standing unsupported feet together 3  From standing position, reach forward with outstretched arm 3  From standing position, pick up object from floor 3  From standing position, turn and look behind over each shoulder 4  Turn 360 4  Standing unsupported, alternately place foot on step 0  Standing unsupported, one foot in front 2  Standing on one leg 1  Total:  40/56        Treatment Date:  05/13/24 STM and TPR to R UT/LS Cervical rotation Sit to stands x10 from chair with airex pad  Marching on airex 2x10 Romberg stance on foam 30sec x2 Romberg stance on floor with EC 2x30sec (CGA) Row GTB 2x10 Extension GTB 2x10   05/10/24 Manual therapy:   STM to bil upper trap, Rt scalenes, levator scapula  Neuro re-ed:   lateral step up onto Airex x 5 each direction - cues for posture, light UE on counter Staggered stance with low row with green band x 10 each LE forward Bow and arrow with same side step back and blue band x 5 each side (challenge with balance) Marching on airex pad  Therapeutic Exercise:  6 min walk test (see above)   05/06/24 Therapeutic Ex:  3 min walk test (see above) with gait belt, no AD (per pt request) STS without use of arms x 5, from standard chair  Neuromuscular re-education: Side stepping L/R on airex beam with light Airex 8 ft each x 2 Narrow base of support on Airex, eyes open x 20sec; eyes closed 5 sec x 3  Forward step up/ retro step back onto Airex x 5, alternating LEs, no UE  support Staggered stance with low row with green band x 10 each LE forward  Reaching with UE to cone on eye level shelf, crossing midline, working weight shifting x 12  9/19  Manual:  Trigger point release to upper traps, cervical paraspinals, levator Supine cervical PROM STM to scalenes, cervical ps Suboccipital release Cervical AROM seated   Neuromuscular reeducation Bilateral ER red 3 x 12 Horizontal abduction 3 x 12 red Airex: Narrow base of support eyes open and eyes closed 3 x 30 seconds each Step on Airex forward and lateral 2 x 10  04/18/24 BERG balance test 40/56 Nu-step L5 Cervical PROM STM cervical paraspinals  9/3  Gait: 3 min walk test   There-act:  5x sit to stand   There-ex: Review of use of nu-step and set up 5 min L5    Manual:  Trigger point release using a tennis ball for lumbar spine pain   Neuro-re-ed Balance testing  8/26 Manual:  Trigger point release to upper traps, cervical paraspinals, levator Supine cervical PROM STM to scalenes, cervical ps Suboccipital release Cervical AROM seated  Instruction in theracane Wand flexion with cuing for posture x10 1# bar seated Seated scap retraction (cues for correct performance)    8/18 Manual:  Trigger point release to upper traps, cervical paraspinals, levator Supine cervical PROM STM to scalenes, cervical ps Suboccipital release Cervical AROM seated  Wand flexion with cuing for posture x10 1# bar seated Seated scap retraction (cues for correct performance)  Posterior shoulder rolls (attempted, but switched to shrugs instead) x10 Postural awareness education    8/13 Manual:  Trigger point release  to upper traps, cervical paraspinals, levator.  neuro-re-ed:  Bilateral er yellow 3x10 with cuing for posture  Horizontal abduction 3x10 with cuing for posture yellow  Wand flexion with cuing for posture 3x10    PATIENT EDUCATION:  Education details: Symptom management, HEP,  self care Person educated: Patient Education method: Explanation, Demonstration, Tactile cues, Verbal cues, and Handouts Education comprehension: verbalized understanding and returned demonstration  HOME EXERCISE PROGRAM: [id number]  ASSESSMENT:  CLINICAL IMPRESSION: Patient reported significant improvement in cervical rotation to right side following seated STM and trigger point release to upper trap and levator scapulae.  With sit to stands he required Airex pad placed in chair for slight elevation.  Patient does struggle with stability with this and was cued to widen base of support as well as pauses once in the standing position to stabilize himself.  Discussed standing for a few seconds before walking once transitioning from a seated position.  Will monitor his complaints of LE weakness in the mornings.  With balance challenges he requires SBA and CGA.  Patient very challenged by standing feet together on floor with eyes closed.  Patient will benefit from ongoing PT for focus on balance, posture and neck pain.  PN: Patient returns to physical therapy with a prescription for gait disturbance.  He had progressive loss of ability to ambulate over the past 2 to 3 months.  On his 3-minute walk test he was unable to complete without seated rest break.  He presents with significant weakness in bilateral lower extremities.  He has decreased balance compared to previous balance assessment.  He did see a vascular doctor in November.  He would benefit from skilled therapy to improve overall balance and ability to ambulate in the community.  Will perform Berg balance test next visit as time allows. Eval:  Patient is a 78 year old male status post C3-C6 cervical fixation on 08/24/2022.  At this point he presents with limited upper extremity strength, limited cervical mobility, and pain with functional activities.  He has decreased grip strength on the left compared to the right.  He would benefit from skilled  therapy to improve ability to use arms for functional activity.  OBJECTIVE IMPAIRMENTS: Abnormal gait, decreased activity tolerance, decreased balance, decreased endurance, difficulty walking, decreased ROM, decreased strength, increased fascial restrictions, increased muscle spasms, postural dysfunction, and pain.   ACTIVITY LIMITATIONS: carrying, lifting, sitting, standing, squatting, transfers, dressing, self feeding, reach over head, and hygiene/grooming  PARTICIPATION LIMITATIONS: meal prep, cleaning, laundry, driving, shopping, community activity, and yard work  PERSONAL FACTORS: 3+ comorbidities: multi joint OA, low back pain, right leg stent failure  are also affecting patient's functional outcome.   REHAB POTENTIAL: Good  CLINICAL DECISION MAKING: Evolving/moderate complexity  EVALUATION COMPLEXITY: Moderate   GOALS: Goals reviewed with patient? Yes  SHORT TERM GOALS: Target date: 12/29/2023    Patient will increase cervical rotation by 10 degrees  Baseline:  Goal status MET 6/18   2.  Patient will demonstrate full flexion of bilateral shoulders  Baseline:  Goal status: improving 6/18   3.  Patient will increase grip strength by 10 lbs  Baseline: see above Goal status:  Partially met - 03/15/24  4. Therapy will perform BErG balance testing on the patient  MET 9/8 (40/56)    5. Patient will increase 3 min walk test distance to 300; 9/4 new goal      LONG TERM GOALS: Target date: POC date    Patient will perform ADL's without neck pain  Baseline:  Goal status: In progress 8/5  2.  Patient will demonstrate 50 degrees of cervical rotation in order to drive  Baseline:  Goal status: MET  6/18    3.  Patient will demonstrate good functional balance in order to improve safety  Baseline:  Goal status: still limited   4. patient will ambulate through a 6 min walk test without a seated rest break. Goal:  9/4  5. Patient will demonstrate good static standing  balance in order to increase safety.  Goal: 9/4       PLAN:  PT FREQUENCY: 1-2x/week  PT DURATION: 8 weeks  PLANNED INTERVENTIONS:   PLAN FOR NEXT SESSION: Consider trigger point release to bilateral upper trap, cervical paraspinals, and parascapular muscles.  Continue with postural exercises.  Asberry Rodes, PTA  05/13/24 5:05 PM [facility] [address] [place], [place], [address] Phone: [phone]   Fax:  [phone]

## 2024-05-17 ENCOUNTER — Encounter (HOSPITAL_BASED_OUTPATIENT_CLINIC_OR_DEPARTMENT_OTHER): Payer: Self-pay

## 2024-05-17 ENCOUNTER — Ambulatory Visit (HOSPITAL_BASED_OUTPATIENT_CLINIC_OR_DEPARTMENT_OTHER): Admitting: Physical Therapy

## 2024-05-18 DIAGNOSIS — E1142 Type 2 diabetes mellitus with diabetic polyneuropathy: Secondary | ICD-10-CM | POA: Diagnosis not present

## 2024-05-19 ENCOUNTER — Emergency Department (HOSPITAL_COMMUNITY)

## 2024-05-19 ENCOUNTER — Inpatient Hospital Stay (HOSPITAL_COMMUNITY)
Admission: EM | Admit: 2024-05-19 | Discharge: 2024-05-21 | DRG: 300 | Disposition: A | Discharge status: Home / Self Care | Attending: Internal Medicine | Admitting: Internal Medicine

## 2024-05-19 ENCOUNTER — Other Ambulatory Visit: Payer: Self-pay

## 2024-05-19 DIAGNOSIS — Z880 Allergy status to penicillin: Secondary | ICD-10-CM

## 2024-05-19 DIAGNOSIS — Z8673 Personal history of transient ischemic attack (TIA), and cerebral infarction without residual deficits: Secondary | ICD-10-CM

## 2024-05-19 DIAGNOSIS — E1169 Type 2 diabetes mellitus with other specified complication: Secondary | ICD-10-CM | POA: Diagnosis present

## 2024-05-19 DIAGNOSIS — M810 Age-related osteoporosis without current pathological fracture: Secondary | ICD-10-CM | POA: Diagnosis present

## 2024-05-19 DIAGNOSIS — I10 Essential (primary) hypertension: Secondary | ICD-10-CM | POA: Diagnosis present

## 2024-05-19 DIAGNOSIS — I480 Paroxysmal atrial fibrillation: Secondary | ICD-10-CM | POA: Diagnosis present

## 2024-05-19 DIAGNOSIS — E785 Hyperlipidemia, unspecified: Secondary | ICD-10-CM | POA: Diagnosis not present

## 2024-05-19 DIAGNOSIS — E118 Type 2 diabetes mellitus with unspecified complications: Secondary | ICD-10-CM | POA: Diagnosis not present

## 2024-05-19 DIAGNOSIS — I70321 Atherosclerosis of unspecified type of bypass graft(s) of the extremities with rest pain, right leg: Secondary | ICD-10-CM | POA: Diagnosis not present

## 2024-05-19 DIAGNOSIS — N184 Chronic kidney disease, stage 4 (severe): Secondary | ICD-10-CM | POA: Diagnosis not present

## 2024-05-19 DIAGNOSIS — G4733 Obstructive sleep apnea (adult) (pediatric): Secondary | ICD-10-CM | POA: Diagnosis present

## 2024-05-19 DIAGNOSIS — I5032 Chronic diastolic (congestive) heart failure: Secondary | ICD-10-CM | POA: Diagnosis present

## 2024-05-19 DIAGNOSIS — I998 Other disorder of circulatory system: Secondary | ICD-10-CM | POA: Diagnosis not present

## 2024-05-19 DIAGNOSIS — M79604 Pain in right leg: Secondary | ICD-10-CM

## 2024-05-19 DIAGNOSIS — I130 Hypertensive heart and chronic kidney disease with heart failure and stage 1 through stage 4 chronic kidney disease, or unspecified chronic kidney disease: Secondary | ICD-10-CM | POA: Diagnosis present

## 2024-05-19 DIAGNOSIS — I2510 Atherosclerotic heart disease of native coronary artery without angina pectoris: Secondary | ICD-10-CM | POA: Diagnosis present

## 2024-05-19 DIAGNOSIS — E1122 Type 2 diabetes mellitus with diabetic chronic kidney disease: Secondary | ICD-10-CM | POA: Diagnosis present

## 2024-05-19 DIAGNOSIS — E1140 Type 2 diabetes mellitus with diabetic neuropathy, unspecified: Secondary | ICD-10-CM | POA: Diagnosis present

## 2024-05-19 DIAGNOSIS — E1149 Type 2 diabetes mellitus with other diabetic neurological complication: Secondary | ICD-10-CM

## 2024-05-19 DIAGNOSIS — I252 Old myocardial infarction: Secondary | ICD-10-CM

## 2024-05-19 DIAGNOSIS — E1142 Type 2 diabetes mellitus with diabetic polyneuropathy: Secondary | ICD-10-CM | POA: Diagnosis present

## 2024-05-19 DIAGNOSIS — Z79899 Other long term (current) drug therapy: Secondary | ICD-10-CM

## 2024-05-19 DIAGNOSIS — Z794 Long term (current) use of insulin: Secondary | ICD-10-CM

## 2024-05-19 DIAGNOSIS — E1151 Type 2 diabetes mellitus with diabetic peripheral angiopathy without gangrene: Principal | ICD-10-CM | POA: Diagnosis present

## 2024-05-19 DIAGNOSIS — I25118 Atherosclerotic heart disease of native coronary artery with other forms of angina pectoris: Secondary | ICD-10-CM

## 2024-05-19 DIAGNOSIS — I739 Peripheral vascular disease, unspecified: Secondary | ICD-10-CM | POA: Diagnosis present

## 2024-05-19 DIAGNOSIS — I25810 Atherosclerosis of coronary artery bypass graft(s) without angina pectoris: Secondary | ICD-10-CM | POA: Diagnosis present

## 2024-05-19 DIAGNOSIS — Z83438 Family history of other disorder of lipoprotein metabolism and other lipidemia: Secondary | ICD-10-CM

## 2024-05-19 DIAGNOSIS — I70221 Atherosclerosis of native arteries of extremities with rest pain, right leg: Secondary | ICD-10-CM | POA: Diagnosis present

## 2024-05-19 DIAGNOSIS — M79661 Pain in right lower leg: Secondary | ICD-10-CM | POA: Diagnosis not present

## 2024-05-19 DIAGNOSIS — Z87891 Personal history of nicotine dependence: Secondary | ICD-10-CM

## 2024-05-19 DIAGNOSIS — Z7901 Long term (current) use of anticoagulants: Secondary | ICD-10-CM

## 2024-05-19 DIAGNOSIS — Z823 Family history of stroke: Secondary | ICD-10-CM

## 2024-05-19 DIAGNOSIS — Z8249 Family history of ischemic heart disease and other diseases of the circulatory system: Secondary | ICD-10-CM

## 2024-05-19 DIAGNOSIS — Z7982 Long term (current) use of aspirin: Secondary | ICD-10-CM

## 2024-05-19 LAB — CBC WITH DIFFERENTIAL/PLATELET
Abs Immature Granulocytes: 0.04 K/uL (ref 0.00–0.07)
Basophils Absolute: 0 K/uL (ref 0.0–0.1)
Basophils Relative: 0 %
Eosinophils Absolute: 0.1 K/uL (ref 0.0–0.5)
Eosinophils Relative: 1 %
HCT: 37.4 % — ABNORMAL LOW (ref 39.0–52.0)
Hemoglobin: 11 g/dL — ABNORMAL LOW (ref 13.0–17.0)
Immature Granulocytes: 0 %
Lymphocytes Relative: 7 %
Lymphs Abs: 0.7 K/uL (ref 0.7–4.0)
MCH: 25.9 pg — ABNORMAL LOW (ref 26.0–34.0)
MCHC: 29.4 g/dL — ABNORMAL LOW (ref 30.0–36.0)
MCV: 88 fL (ref 80.0–100.0)
Monocytes Absolute: 0.4 K/uL (ref 0.1–1.0)
Monocytes Relative: 4 %
Neutro Abs: 8 K/uL — ABNORMAL HIGH (ref 1.7–7.7)
Neutrophils Relative %: 88 %
Platelets: 231 K/uL (ref 150–400)
RBC: 4.25 MIL/uL (ref 4.22–5.81)
RDW: 18 % — ABNORMAL HIGH (ref 11.5–15.5)
WBC: 9.2 K/uL (ref 4.0–10.5)
nRBC: 0 % (ref 0.0–0.2)

## 2024-05-19 LAB — RETICULOCYTES
Immature Retic Fract: 17.8 % — ABNORMAL HIGH (ref 2.3–15.9)
RBC.: 4.07 MIL/uL — ABNORMAL LOW (ref 4.22–5.81)
Retic Count, Absolute: 54.9 K/uL (ref 19.0–186.0)
Retic Ct Pct: 1.4 % (ref 0.4–3.1)

## 2024-05-19 LAB — IRON AND TIBC
Iron: 31 ug/dL — ABNORMAL LOW (ref 45–182)
Saturation Ratios: 7 % — ABNORMAL LOW (ref 17.9–39.5)
TIBC: 458 ug/dL — ABNORMAL HIGH (ref 250–450)
UIBC: 427 ug/dL

## 2024-05-19 LAB — COMPREHENSIVE METABOLIC PANEL WITH GFR
ALT: 12 U/L (ref 0–44)
AST: 13 U/L — ABNORMAL LOW (ref 15–41)
Albumin: 3.4 g/dL — ABNORMAL LOW (ref 3.5–5.0)
Alkaline Phosphatase: 66 U/L (ref 38–126)
Anion gap: 12 (ref 5–15)
BUN: 45 mg/dL — ABNORMAL HIGH (ref 8–23)
CO2: 25 mmol/L (ref 22–32)
Calcium: 8.7 mg/dL — ABNORMAL LOW (ref 8.9–10.3)
Chloride: 100 mmol/L (ref 98–111)
Creatinine, Ser: 3.07 mg/dL — ABNORMAL HIGH (ref 0.61–1.24)
GFR, Estimated: 20 mL/min — ABNORMAL LOW (ref >60)
Glucose, Bld: 378 mg/dL — ABNORMAL HIGH (ref 70–99)
Potassium: 3.7 mmol/L (ref 3.5–5.1)
Sodium: 137 mmol/L (ref 135–145)
Total Bilirubin: 0.8 mg/dL (ref 0.0–1.2)
Total Protein: 6.4 g/dL — ABNORMAL LOW (ref 6.5–8.1)

## 2024-05-19 LAB — PHOSPHORUS: Phosphorus: 4 mg/dL (ref 2.5–4.6)

## 2024-05-19 LAB — BASIC METABOLIC PANEL WITH GFR
Anion gap: 13 (ref 5–15)
BUN: 45 mg/dL — ABNORMAL HIGH (ref 8–23)
CO2: 23 mmol/L (ref 22–32)
Calcium: 8.6 mg/dL — ABNORMAL LOW (ref 8.9–10.3)
Chloride: 102 mmol/L (ref 98–111)
Creatinine, Ser: 3.24 mg/dL — ABNORMAL HIGH (ref 0.61–1.24)
GFR, Estimated: 19 mL/min — ABNORMAL LOW (ref >60)
Glucose, Bld: 424 mg/dL — ABNORMAL HIGH (ref 70–99)
Potassium: 4 mmol/L (ref 3.5–5.1)
Sodium: 138 mmol/L (ref 135–145)

## 2024-05-19 LAB — HEMOGLOBIN A1C
Hgb A1c MFr Bld: 9.1 % — ABNORMAL HIGH (ref 4.8–5.6)
Mean Plasma Glucose: 214.47 mg/dL

## 2024-05-19 LAB — CK: Total CK: 153 U/L (ref 49–397)

## 2024-05-19 LAB — CBG MONITORING, ED
Glucose-Capillary: 344 mg/dL — ABNORMAL HIGH (ref 70–99)
Glucose-Capillary: 322 mg/dL — ABNORMAL HIGH (ref 70–99)

## 2024-05-19 LAB — FERRITIN: Ferritin: 4 ng/mL — ABNORMAL LOW (ref 24–336)

## 2024-05-19 LAB — MAGNESIUM: Magnesium: 1.6 mg/dL — ABNORMAL LOW (ref 1.7–2.4)

## 2024-05-19 MED ORDER — SODIUM CHLORIDE 0.9 % IV SOLN
INTRAVENOUS | Status: AC
Start: 2024-05-19 — End: 2024-05-20

## 2024-05-19 MED ORDER — LACTATED RINGERS IV BOLUS
1500.0000 mL | Freq: Once | INTRAVENOUS | Status: DC
Start: 2024-05-19 — End: 2024-05-19

## 2024-05-19 MED ORDER — LACTATED RINGERS IV BOLUS
500.0000 mL | Freq: Once | INTRAVENOUS | Status: DC
Start: 2024-05-19 — End: 2024-05-19

## 2024-05-19 MED ORDER — INSULIN ASPART 100 UNIT/ML IJ SOLN
0.0000 [IU] | INTRAMUSCULAR | Status: DC
  Administered 2024-05-19: 7 [IU] via SUBCUTANEOUS
  Administered 2024-05-20: 3 [IU] via SUBCUTANEOUS
  Administered 2024-05-20: 2 [IU] via SUBCUTANEOUS
  Administered 2024-05-20: 3 [IU] via SUBCUTANEOUS
  Administered 2024-05-21 (×2): 2 [IU] via SUBCUTANEOUS
  Administered 2024-05-21: 3 [IU] via SUBCUTANEOUS

## 2024-05-19 MED ORDER — LACTATED RINGERS IV BOLUS
1000.0000 mL | Freq: Once | INTRAVENOUS | Status: AC
  Administered 2024-05-19: 1000 mL via INTRAVENOUS
  Administered 2024-05-19: 200 mL via INTRAVENOUS

## 2024-05-19 MED ORDER — HEPARIN BOLUS VIA INFUSION
3000.0000 [IU] | Freq: Once | INTRAVENOUS | Status: AC
  Administered 2024-05-19: 3000 [IU] via INTRAVENOUS
  Filled 2024-05-19: qty 3000

## 2024-05-19 MED ORDER — HEPARIN (PORCINE) 25000 UT/250ML-% IV SOLN
1500.0000 [IU]/h | INTRAVENOUS | Status: DC
  Administered 2024-05-19: 1600 [IU]/h via INTRAVENOUS
  Administered 2024-05-20: 1500 [IU]/h via INTRAVENOUS
  Filled 2024-05-19 (×2): qty 250

## 2024-05-19 MED ORDER — SODIUM CHLORIDE 0.9 % IV BOLUS: 1000.0000 mL | Freq: Once | INTRAVENOUS | Status: DC

## 2024-05-19 NOTE — ED Triage Notes (Signed)
 Pt. Stated, around 24 today I started having rt. Leg calf pain and then it went to my toes.

## 2024-05-19 NOTE — H&P (View-Only) (Signed)
 Hospital Consult    Reason for Consult:  pain right leg Requesting Physician:  ER MRN #:  [id number]  History of Present Illness: This is a 78 y.o. male who presented to the hospital with pain/cramping in the right leg earlier today.  They live in [place] and the fire fighters arrived 1st and helped put him in the car and his wife drove him to [facility] bc the EMS would have taken him to [facility] and his doctors are here at [facility]    He has hx of right femoral above-knee popliteal artery bypass on 11/30/2020 with Dr. Oris.  He also underwent an aortobifemoral bypass in 1998.  He underwent left carotid endarterectomy in 1994 and redo in 2001.  Right TCAR was performed 06/01/20.   He was seen in our office in May 2025, he was having some balance issues and not able to walk as much as he would like.  He was not having any rest pain or claudication and the wound on his 4th toe had healed.  He had been seen by Dr. Magda in March and at that time, he noted the right leg bypass was clinically occluded.   They discussed intervention vs watchful waiting and they decided on watchful waiting.   He has hx of cervical spine surgery.  He has hx of CAD with CAGB, HLD, HTN, DM, PAF on Eliquis .   He has CKD 3-4 and his creatinine today is 3.24.    The pt is on a statin for cholesterol management.  The pt is on a daily aspirin .   Other AC:  eliquis  The pt is on CCB, hydralazine , BB for hypertension.   The pt is  on medication for diabetes PTA. Tobacco hx:  former  Past Medical History:  Diagnosis Date   Acquired equinus deformity of foot 12/15/2011   Anemia    Arthritis    Carotid artery occlusion    a. Carotid US  10/16: RICA 60-79%, L CEA patent with 1-39% stenosis   CKD (chronic kidney disease)    stage 3-4   Coagulation disorder 05/25/2019   Coronary artery disease    a. Myoview  9/16:  EF 52%, inferior fixed defect consistent with diaphragmatic attenuation, intermediate risk secondary to poor exercise  tolerance and symptoms during stress;  b. LHC 05/17/2015 90% mid LAD, 80% ost D2, 75% mid RCA, 35% prox RCA. >> S/p CABG   Coronary artery disease involving coronary bypass graft of native heart with angina pectoris    CORONARY ARTERY BYPASS GRAFTING x 3 - 05/23/2015 Left internal mammary artery to left anterior descending  Saphenous vein graft to diagonal  Saphenous vein graft to posterior descending Endoscopic greater saphenous vein harvest right thigh     Developmental delay    Diabetes mellitus age 41   Diverticulitis    Diverticulosis of colon without hemorrhage 05/17/2015   By CT scan    DM type 2, controlled, with complication ([name]) 04/29/2015   Essential hypertension 04/29/2015   GERD (gastroesophageal reflux disease)    H/O hiatal hernia    History of echocardiogram    a. Echo 9/16: GLS -15.2%, EF 55-60%, grade 1 diastolic dysfunction, normal wall motion, aortic sclerosis, dilated aortic root 39 mm, atrial septal lipomatous hypertrophy   Hyperlipidemia    Hypertension    Irritable bowel syndrome (IBS)    Myocardial infarction ([name])    silent inferior MI; patient denies MI history (03/17/13)    Neuralgia    Neuropathy 2013   OSA on  CPAP 05/30/2015   Osteoporosis 2013   PAF (paroxysmal atrial fibrillation) ([name])    post CABG 2016; Amiodarone  stopped 2/2 wheezing; PAF 01/12/20   Peripheral vascular disease    Plantar fasciitis 12/15/2011   Pneumonia    Reflux esophagitis    Right bundle branch block 04/30/2015   Sleep apnea    uses CPAP   Stenosis of right internal carotid artery with cerebral infarction ([name]) 12/09/2011   Stress fracture of metatarsal bone 02/18/2012   Stroke ([name]) 1987   Right brain stroke- slight drop on left side   Syncope 01/12/2020   Type 2 diabetes mellitus with vascular disease ([name]) 05/30/2015   Wears glasses     Past Surgical History:  Procedure Laterality Date   ABDOMINAL AORTOGRAM W/LOWER EXTREMITY Bilateral 11/27/2020   Procedure: ABDOMINAL  AORTOGRAM W/LOWER EXTREMITY;  Surgeon: Serene Gaile ORN, MD;  Location: [facility] INVASIVE CV LAB;  Service: Cardiovascular;  Laterality: Bilateral;   ANTERIOR CERVICAL DECOMP/DISCECTOMY FUSION N/A 08/25/2023   Procedure: CERVICAL THREE-FOUR, CERVICAL FOUR-FIVE, CERVICAL FIVE-SIX ANTERIOR CERVICAL DECOMPRESSION/DISCECTOMY FUSION;  Surgeon: Debby Dorn MATSU, MD;  Location: [facility] OR;  Service: Neurosurgery;  Laterality: N/A;   CARDIAC CATHETERIZATION N/A 05/17/2015   Procedure: Left Heart Cath and Coronary Angiography;  Surgeon: Victory ORN Sharps, MD;  Location: [facility] INVASIVE CV LAB;  Service: Cardiovascular;  Laterality: N/A;   CAROTID ENDARTERECTOMY  1994 & redo 2001   Left   CATARACT EXTRACTION W/ INTRAOCULAR LENS IMPLANT Bilateral    COLONOSCOPY W/ BIOPSIES AND POLYPECTOMY     CORONARY ARTERY BYPASS GRAFT N/A 05/23/2015   Procedure: CORONARY ARTERY BYPASS GRAFTING (CABG) x 3 (LIMA to LAD, SVG to DIAGONAL 2, SVG to PDA) with Endoscopic Vein Harvesting from right greater saphenous vein;  Surgeon: Dallas KATHEE Jude, MD;  Location: [facility] OR;  Service: Open Heart Surgery;  Laterality: N/A;   ENDARTERECTOMY FEMORAL Right 11/30/2020   Procedure: ENDARTERECTOMY FEMORAL RIGHT;  Surgeon: Oris Krystal FALCON, MD;  Location: [facility] OR;  Service: Vascular;  Laterality: Right;   FEMORAL-POPLITEAL BYPASS GRAFT Right 11/30/2020   Procedure: BYPASS GRAFT FEMORAL-POPLITEAL ARTERY RIGHT;  Surgeon: Oris Krystal FALCON, MD;  Location: [facility] OR;  Service: Vascular;  Laterality: Right;   FRACTURE SURGERY  2013   Right   foo  t X's 2   ILIAC ARTERY STENT     LAPAROSCOPIC CHOLECYSTECTOMY  09/11/2016   POSTERIOR LUMBAR FUSION  03/24/2013   Level 1   PR VEIN BYPASS GRAFT,AORTO-FEM-POP  1998   SPINE SURGERY  2014   TEE WITHOUT CARDIOVERSION N/A 05/23/2015   Procedure: TRANSESOPHAGEAL ECHOCARDIOGRAM (TEE);  Surgeon: Dallas KATHEE Jude, MD;  Location: [facility] OR;  Service: Open Heart Surgery;  Laterality: N/A;   TONSILLECTOMY     TRANSCAROTID ARTERY  REVASCULARIZATION  Right 06/01/2020   Procedure: RIGHT TRANSCAROTID ARTERY REVASCULARIZATION;  Surgeon: Serene Gaile ORN, MD;  Location: [facility] OR;  Service: Vascular;  Laterality: Right;   TRIGGER FINGER RELEASE Right 07/14/2017   Procedure: RIGHT LONG FINGER TRIGGER RELEASE;  Surgeon: Sebastian Lenis, MD;  Location:  SURGERY CENTER;  Service: Orthopedics;  Laterality: Right;    Allergies  Allergen Reactions   Doxazosin Mesylate     Other reaction(s): Diarrhea, dizziness   Hydrocodone Nausea Only   Niacin     Other reaction(s): Skin irritation   Oxycodone  Nausea Only   Penicillins Rash    Has patient had a PCN reaction causing immediate rash, facial/tongue/throat swelling, SOB or lightheadedness with hypotension: NO Has patient had a PCN reaction  causing severe rash involving mucus membranes or skin necrosis:NO Has patient had a PCN reaction that required hospitalization NO Has patient had a PCN reaction occurring within the last 10 years: NO If all of the above answers are NO, then may proceed with Cephalosporin use.    Sulfa Drugs Cross Reactors Rash    Prior to Admission medications   Medication Sig Start Date End Date Taking? Authorizing Provider  amLODipine  (NORVASC ) 10 MG tablet Take 1 tablet (10 mg total) by mouth daily. 03/26/20  Yes Claudene Victory ORN, MD  apixaban  (ELIQUIS ) 5 MG TABS tablet Take 1 tablet (5 mg total) by mouth 2 (two) times daily. 08/13/21  Yes Claudene Victory ORN, MD  aspirin  EC 81 MG tablet Take 81 mg by mouth daily. Swallow whole.   Yes [provider]  atorvastatin  (LIPITOR) 40 MG tablet Take 40 mg by mouth daily.   Yes [provider]  Cholecalciferol  (VITAMIN D3) 125 MCG (5000 UT) TABS Take 5,000 Units by mouth daily.    Yes [provider]  dexlansoprazole (DEXILANT) 60 MG capsule Take 60 mg by mouth daily.    Yes [provider]  famotidine  (PEPCID ) 20 MG tablet Take 20 mg by mouth at bedtime. 01/22/22  Yes [provider]  gabapentin  (NEURONTIN ) 300 MG capsule Take 600 mg by mouth at bedtime.  02/20/19  Yes [provider]  HUMALOG  KWIKPEN 100 UNIT/ML KwikPen Inject 25-30 Units into the skin 3 (three) times daily. 05/15/23  Yes [provider]  hydrALAZINE  (APRESOLINE ) 25 MG tablet Take 1 tablet (25 mg total) by mouth in the morning and at bedtime. 10/08/21  Yes Claudene Victory ORN, MD  metoprolol  tartrate (LOPRESSOR ) 50 MG tablet TAKE 1 AND 1/2 TABLETS BY MOUTH TWICE DAILY 01/17/22  Yes Claudene Victory ORN, MD  testosterone  cypionate (DEPOTESTOSTERONE CYPIONATE) 200 MG/ML injection Inject 200 mg into the muscle once a week. 03/17/19  Yes [provider]  torsemide  (DEMADEX ) 20 MG tablet Take 1 tablet (20 mg total) by mouth daily. 10/08/21  Yes Claudene Victory ORN, MD  TRESIBA  FLEXTOUCH 100 UNIT/ML SOPN FlexTouch Pen Inject 25 Units into the skin daily. 03/30/19  Yes [provider]  Dulaglutide  (TRULICITY ) 3 MG/0.5ML SOPN Inject 3 mg into the skin every Sunday. 05/14/23   [provider]  hydrocortisone  (ANUSOL -HC) 2.5 % rectal cream Place 1 application rectally daily as needed for hemorrhoids.     [provider]  ipratropium (ATROVENT) 0.03 % nasal spray Place 2 sprays into both nostrils 2 (two) times daily. Patient not taking: Reported on 05/19/2024 03/21/24   [provider]  ketoconazole  (NIZORAL ) 2 % shampoo Apply 1 application topically daily as needed for irritation.  02/19/19   [provider]  Melatonin 5 MG CHEW Chew 10 mg by mouth at bedtime. Patient not taking: Reported on 05/19/2024 10/18/20   [provider]  methocarbamol  (ROBAXIN ) 500 MG tablet Take 500 mg by mouth every 6 (six) hours as needed for muscle spasms.  12/08/19   [provider]  nitroGLYCERIN  (NITROSTAT ) 0.3 MG SL tablet Place 1 tablet (0.3 mg total) under the tongue every 5 (five) minutes as needed for chest pain. Only as needed 10/08/21   Claudene Victory ORN, MD   traMADol  (ULTRAM ) 50 MG tablet Take 1-2 tablets (50-100 mg total) by mouth every 6 (six) hours as needed for moderate pain (pain score 4-6) or severe pain (pain score 7-10). Patient not taking: Reported on 05/19/2024 08/26/23   Johnanna,  Camie Mates, PA-C    Social History   Socioeconomic History   Marital status: Married    Spouse name: Not on file   Number of children: 2   Years of education: Not on file   Highest education level: Not on file  Occupational History   Not on file  Tobacco Use   Smoking status: Former    Current packs/day: 0.00    Average packs/day: 2.0 packs/day for 50.0 years (100.0 ttl pk-yrs)    Types: Cigarettes    Start date: 03/27/1959    Quit date: 03/26/2009    Years since quitting: 15.1   Smokeless tobacco: Never  Vaping Use   Vaping status: Never Used  Substance and Sexual Activity   Alcohol use: No   Drug use: No   Sexual activity: Yes  Other Topics Concern   Not on file  Social History Narrative   Lives with wife   Caffeine coffee 3 c daily, diet coke   Social Drivers of Corporate investment banker Strain: Not on file  Food Insecurity: Not on file  Transportation Needs: Not on file  Physical Activity: Not on file  Stress: Not on file  Social Connections: Not on file  Intimate Partner Violence: Not on file    Family History  Problem Relation Age of Onset   Cancer Mother 2       pancreatic   Hyperlipidemia Mother    Stroke Father 80   Deep vein thrombosis Father    Hyperlipidemia Father    Hypertension Father     ROS: [x]  Positive   [ ]  Negative   [ ]  All sytems reviewed and are negative  Cardiac: [x]  CAD hx of CABG [x]  PAF  Vascular: [x]  pain/cramping in right leg  Pulmonary: [x]  OSA on CPAP  Neurologic: []  hx of CVA []  mini stroke   Hematologic: []  hx of cancer  Endocrine:   [x]  diabetes []  thyroid  disease  GI [x]  GERD [x]  IBS [x]  diverticulitis  GU: [x]  CKD/renal failure []  HD--[]  M/W/F or []   T/T/S  Psychiatric: []  anxiety []  depression  Musculoskeletal: [x]  arthritis  Integumentary: []  rashes []  ulcers  Constitutional: []  fever  []  chills  Physical Examination  Vitals:   05/19/24 1900 05/19/24 1945  BP: 126/69 126/63  Pulse: 64 72  Resp: 15 12  Temp:    SpO2: 99% 100%   Body mass index is 29.43 kg/m.  General:  WDWN in NAD Gait: Not observed HENT: WNL, normocephalic Pulmonary: normal non-labored breathing Cardiac: regular Abdomen:  soft, NT Skin: without rashes Vascular Exam/Pulses: Left PT doppler signal present Doppler signals were not appreciated in the right foot.  He has a faint popliteal doppler signal.  He has easily palpable femoral pulses bilaterally Extremities: motor and sensory are in tact.  The right foot is slightly cooler than the left foot.  Musculoskeletal: no muscle wasting or atrophy  Neurologic: A&O X 3 Psychiatric:  The pt has Normal affect.   CBC    Component Value Date/Time   WBC 9.2 05/19/2024 1428   RBC 4.25 05/19/2024 1428   HGB 11.0 (L) 05/19/2024 1428   HGB 13.9 02/01/2020 1219   HCT 37.4 (L) 05/19/2024 1428   HCT 39.6 02/01/2020 1219   PLT 231 05/19/2024 1428   PLT 258 02/01/2020 1219   MCV 88.0 05/19/2024 1428   MCV 98 (H) 02/01/2020 1219   MCH 25.9 (L) 05/19/2024 1428   MCHC 29.4 (L) 05/19/2024 1428   RDW  18.0 (H) 05/19/2024 1428   RDW 13.1 02/01/2020 1219   LYMPHSABS 0.7 05/19/2024 1428   MONOABS 0.4 05/19/2024 1428   EOSABS 0.1 05/19/2024 1428   BASOSABS 0.0 05/19/2024 1428    BMET    Component Value Date/Time   NA 138 05/19/2024 1428   NA 134 02/01/2020 1219   K 4.0 05/19/2024 1428   CL 102 05/19/2024 1428   CO2 23 05/19/2024 1428   GLUCOSE 424 (H) 05/19/2024 1428   BUN 45 (H) 05/19/2024 1428   BUN 21 02/01/2020 1219   CREATININE 3.24 (H) 05/19/2024 1428   CREATININE 1.76 (H) 09/19/2015 1111   CALCIUM  8.6 (L) 05/19/2024 1428   GFRNONAA 19 (L) 05/19/2024 1428   GFRAA 41 (L) 02/01/2020 1219     COAGS: Lab Results  Component Value Date   INR 1.0 11/30/2020   INR 1.0 06/01/2020   INR WILL FOLLOW 10/21/2016   INR 1.0 10/21/2016     Non-Invasive Vascular Imaging:    RLE arterial duplex 05/19/2024 +-----------+--------+-----+--------+-----------------+--------+  RIGHT     PSV cm/sRatioStenosisWaveform         Comments  +-----------+--------+-----+--------+-----------------+--------+  POP Prox                        severely dampened          +-----------+--------+-----+--------+-----------------+--------+  POP Distal                      severely dampened          +-----------+--------+-----+--------+-----------------+--------+  ATA Prox                occluded                           +-----------+--------+-----+--------+-----------------+--------+  ATA Mid                 occluded                           +-----------+--------+-----+--------+-----------------+--------+  ATA Distal              occluded                           +-----------+--------+-----+--------+-----------------+--------+  PTA Prox                occluded                           +-----------+--------+-----+--------+-----------------+--------+  PTA Mid                 occluded                           +-----------+--------+-----+--------+-----------------+--------+  PTA Distal              occluded                           +-----------+--------+-----+--------+-----------------+--------+  PERO Prox               occluded                           +-----------+--------+-----+--------+-----------------+--------+  PERO Mid  occluded                           +-----------+--------+-----+--------+-----------------+--------+  PERO Distal             occluded                           +-----------+--------+-----+--------+-----------------+--------+    Right Graft #1: Fem-Pop   +------------------+--------+--------+--------+--------+                   PSV cm/sStenosisWaveformComments  +------------------+--------+--------+--------+--------+  Inflow                                             +------------------+--------+--------+--------+--------+  Prox Anastomosis          occluded                  +------------------+--------+--------+--------+--------+  Proximal Graft            occluded                  +------------------+--------+--------+--------+--------+  Mid Graft                 occluded                  +------------------+--------+--------+--------+--------+  Distal Graft              occluded                  +------------------+--------+--------+--------+--------+  Distal Anastomosis        occluded                  +------------------+--------+--------+--------+--------+  Outflow                  occluded                  +------------------+--------+--------+--------+--------+   Summary:  Right: The right femoral-popliteal graft is occluded. No notable 3 vessel run off to foot.   DVT study 05/19/2024 Right - There is no evidence of deep vein thrombosis in the lower extremity.  - No cystic structure found in the popliteal fossa.    LEFT:  - No evidence of common femoral vein obstruction    ASSESSMENT/PLAN: This is a 78 y.o. male with hx of right femoral above-knee popliteal artery bypass on 11/30/2020 with Dr. Oris.  He also underwent an aortobifemoral bypass in 1998.  He underwent left carotid endarterectomy in 1994 and redo in 2001.  Right TCAR was performed 06/01/20.  He presented to the ED today with right foot/leg pain and cramping that was severe earlier today and has eased up since arriving to the hospital.   -pt seen with Dr. Pearline.  Unable to obtain doppler flow in the right foot.  He has a faint doppler signal in the right popliteal and easily palpable femoral pulses bilaterally.   Dr. Pearline discussed with pt and we will have medicine admit patient and will start heparin  gtt.  Given his pain/cramping has improved, and motor and sensory are intact right foot, not emergent need to proceed to OR currently.   -he may need angiogram at some point, however, he has been on Eliquis  and his creatinine is 3.24 today.   -admit to [facility] [address] with medicine   Lucie  Rhyne, PA-C [facility] [phone]  VASCULAR STAFF ADDENDUM: I have independently interviewed and examined the patient. I agree with the above.  12-year-old male with complex vascular history with an aortobifemoral bypass done in 1998 and a right femoral to above-knee popliteal artery bypass on 11/30/20.  He was last seen by Dr. Magda in March and the right femoral to above-knee popliteal artery bypass was presumed to be occluded based on physical exam although there was no imaging at that time. Today he began having severe right calf and foot pain which relieved after 2 hours.  I am unable to obtain Doppler flow at the ankle or foot although he is motor and sensory intact and currently denying pain. Will plan for heparin  drip with a medicine admission and reevaluate tomorrow.  May potentially benefit from angiogram to better assess his perfusion although his creatinine is elevated at 3.1 and usually trends are in the 2s and also took his Eliquis  this morning.  Norman GORMAN Serve MD [facility] Phone Number: [phone] 05/19/2024 9:07 PM

## 2024-05-19 NOTE — Subjective & Objective (Signed)
 Presented with right leg pian in the calf going down toes Has hx of PAD and chronic claudication   duplex of R fem-pop graft showing occlusion.  Vascular was consulted and recommended heparin  Plan for angiogram in AM

## 2024-05-19 NOTE — ED Provider Triage Note (Signed)
 Emergency Medicine Provider Triage Evaluation Note  Bradley [name] , a 78 y.o. male  was evaluated in triage.  Pt complains of right leg pain.  Significant history of vascular issues including bypass to bilateral lower extremities.  Pain occurred while he was at rest that was severe.  Has ongoing pain.  No prior history of blood clots.  Currently is on Eliquis   Review of Systems  Positive: As above Negative: As above  Physical Exam  BP 133/72   Pulse 75   Temp (!) 97.5 F (36.4 C)   Resp 16   Ht 5' 11 (1.803 m)   Wt 95.7 kg   SpO2 94%   BMI 29.43 kg/m  Gen:   Awake, no distress   Resp:  Normal effort  MSK:   Moves extremities without difficulty  Other:    Medical Decision Making  Medically screening exam initiated at 2:28 PM.  Appropriate orders placed.  Bradley [name] was informed that the remainder of the evaluation will be completed by another provider, this initial triage assessment does not replace that evaluation, and the importance of remaining in the ED until their evaluation is complete.    Hildegard Loge, PA-C 05/19/24 1428

## 2024-05-19 NOTE — Progress Notes (Signed)
 PHARMACY - ANTICOAGULATION CONSULT NOTE  Pharmacy Consult for heparin  Indication: limb ischemia  Allergies  Allergen Reactions   Doxazosin Mesylate     Other reaction(s): Diarrhea, dizziness   Hydrocodone Nausea Only   Niacin     Other reaction(s): Skin irritation   Oxycodone  Nausea Only   Penicillins Rash    Has patient had a PCN reaction causing immediate rash, facial/tongue/throat swelling, SOB or lightheadedness with hypotension: NO Has patient had a PCN reaction causing severe rash involving mucus membranes or skin necrosis:NO Has patient had a PCN reaction that required hospitalization NO Has patient had a PCN reaction occurring within the last 10 years: NO If all of the above answers are NO, then may proceed with Cephalosporin use.    Sulfa Drugs Cross Reactors Rash   Patient Measurements: Height: 5' 11 (180.3 cm) Weight: 95.7 kg (211 lb) IBW/kg (Calculated) : 75.3 HEPARIN  DW (KG): 94.6  Vital Signs: Temp: 97.5 F (36.4 C) (10/09 1353) BP: 137/95 (10/09 1736) Pulse Rate: 75 (10/09 1736)  Labs: Recent Labs    05/19/24 1428  HGB 11.0*  HCT 37.4*  PLT 231  CREATININE 3.24*   Estimated Creatinine Clearance: 21.5 mL/min (A) (by C-G formula based on SCr of 3.24 mg/dL (H)).  Medical History: Past Medical History:  Diagnosis Date   Acquired equinus deformity of foot 12/15/2011   Anemia    Arthritis    Carotid artery occlusion    a. Carotid US  10/16: RICA 60-79%, L CEA patent with 1-39% stenosis   CKD (chronic kidney disease)    stage 3-4   Coagulation disorder 05/25/2019   Coronary artery disease    a. Myoview  9/16:  EF 52%, inferior fixed defect consistent with diaphragmatic attenuation, intermediate risk secondary to poor exercise tolerance and symptoms during stress;  b. LHC 05/17/2015 90% mid LAD, 80% ost D2, 75% mid RCA, 35% prox RCA. >> S/p CABG   Coronary artery disease involving coronary bypass graft of native heart with angina pectoris    CORONARY  ARTERY BYPASS GRAFTING x 3 - 05/23/2015 Left internal mammary artery to left anterior descending  Saphenous vein graft to diagonal  Saphenous vein graft to posterior descending Endoscopic greater saphenous vein harvest right thigh     Developmental delay    Diabetes mellitus age 56   Diverticulitis    Diverticulosis of colon without hemorrhage 05/17/2015   By CT scan    DM type 2, controlled, with complication ([name]) 04/29/2015   Essential hypertension 04/29/2015   GERD (gastroesophageal reflux disease)    H/O hiatal hernia    History of echocardiogram    a. Echo 9/16: GLS -15.2%, EF 55-60%, grade 1 diastolic dysfunction, normal wall motion, aortic sclerosis, dilated aortic root 39 mm, atrial septal lipomatous hypertrophy   Hyperlipidemia    Hypertension    Irritable bowel syndrome (IBS)    Myocardial infarction ([name])    silent inferior MI; patient denies MI history (03/17/13)    Neuralgia    Neuropathy 2013   OSA on CPAP 05/30/2015   Osteoporosis 2013   PAF (paroxysmal atrial fibrillation) ([name])    post CABG 2016; Amiodarone  stopped 2/2 wheezing; PAF 01/12/20   Peripheral vascular disease    Plantar fasciitis 12/15/2011   Pneumonia    Reflux esophagitis    Right bundle branch block 04/30/2015   Sleep apnea    uses CPAP   Stenosis of right internal carotid artery with cerebral infarction ([name]) 12/09/2011   Stress fracture of metatarsal bone 02/18/2012  Stroke [facility]) 1987   Right brain stroke- slight drop on left side   Syncope 01/12/2020   Type 2 diabetes mellitus with vascular disease ([name]) 05/30/2015   Wears glasses    Medications:  Scheduled:  Infusions:   lactated ringers      Assessment: Patient is an 78 YO male presenting with leg calf pain; duplex of R fem-pop graft showing occlusion. Pharmacy consulted to dose heparin  for limb ischemia. Patient on Eliquis  PTA for atrial fibrillation and last dose taken 10/9 in the AM. Initial Hgb 11 and plt WNL. Will monitor initially  using aPTT given recent DOAC administration. Will give partial bolus of heparin  given acute clot but recent DOAC administration.   Goal of Therapy:  Heparin  level 0.3-0.7 units/ml aPTT 66-102 seconds Monitor platelets by anticoagulation protocol: Yes   Plan:  -Give heparin  bolus of 3000 units -Start heparin  infusion at 1600 units/h.  -Check aPTT in 8h.  -Monitor aPTT, heparin  level, CBC daily.   Maurilio Patten, PharmD PGY1 Pharmacy Resident [facility] 05/19/2024 6:32 PM

## 2024-05-19 NOTE — H&P (Signed)
 Bradley [name] FMW:[id number] DOB: [birth year] DOA: 05/19/2024     PCP: Okey Carlin Redbird, MD   Outpatient Specialists:   CARDS: Dr. Lonni Cash, MD Nephrology: Dr. Rayburn NS - Dr. Debby NEurology    Dr.Penumali  GI  Dr.  Gwen,  ) Rosalie Kitchens, MD    Patient arrived to ER on 05/19/24 at 1349 Referred by Attending Francesca Elsie CROME, MD   Patient coming from:    home Lives   With family      Chief Complaint:   Chief Complaint  Patient presents with   Leg Pain    HPI: Bradley [name] is a 78 y.o. male with medical history significant of HTN, HLD, DM2, PAD, anemia, CKD 3b, CAD sp CABG, diastolic CHF, Neuropathy, OSA, A.fib eliquis , RBBB, CvA    Presented with   right leg cramping pain, say leg has been cooler since January Presented with right leg pian in the calf going down toes Has hx of PAD and chronic claudication   duplex of R fem-pop graft showing occlusion.  Vascular was consulted and recommended heparin  Plan for angiogram in AM      Denies significant ETOH intake   Does not smoke      Regarding pertinent Chronic problems:    Hyperlipidemia - on statins Lipitor (atorvastatin )  Lipid Panel     Component Value Date/Time   CHOL 88 12/01/2020 0230   TRIG 163 (H) 12/01/2020 0230   HDL 24 (L) 12/01/2020 0230   CHOLHDL 3.7 12/01/2020 0230   VLDL 33 12/01/2020 0230   LDLCALC 31 12/01/2020 0230     HTN on NOrvasc , hydralazine  metoprol   chronic CHF diastolic - last echo  Recent Results (from the past 56199 hours)  ECHOCARDIOGRAM COMPLETE   Collection Time: 06/24/22  2:50 PM  Result Value   Area-P 1/2 3.51   S' Lateral 3.10   AV Area mean vel 2.25   AR max vel 2.27   AV Area VTI 2.31   Ao pk vel 1.48   AV Mean grad 5.0   AV Peak grad 8.7   Narrative      ECHOCARDIOGRAM REPORT      IMPRESSIONS    1. Left ventricular ejection fraction, by estimation, is 60 to 65%. The left ventricle has normal function. The left ventricle has  no regional wall motion abnormalities. There is mild concentric left ventricular hypertrophy. Left ventricular diastolic  parameters are consistent with Grade I diastolic dysfunction (impaired relaxation).  2. Right ventricular systolic function is normal. The right ventricular size is normal. There is normal pulmonary artery systolic pressure.  3. No evidence of mitral valve regurgitation.  4. Aortic valve regurgitation is not visualized. Aortic valve sclerosis/calcification is present, without any evidence of aortic stenosis.  5. The inferior vena cava is normal in size with greater than 50% respiratory variability, suggesting right atrial pressure of 3 mmHg.           CAD  - On Aspirin , statin,                  - followed by cardiology                       DM 2 -  Lab Results  Component Value Date   HGBA1C 7.5 (H) 11/30/2020   on insulin ,       OSA -on nocturnal  CPAP    Hx of CVA -  with/out residual  deficits on Aspirin  81 mg,     A. Fib -   atrial fibrillation CHA2DS2 vas score     7     current  on anticoagulation with   Eliquis ,       -  Rate control:  Currently controlled with Metoprolol            CKD stage IIIb baseline Cr 2.5 Estimated Creatinine Clearance: 21.5 mL/min (A) (by C-G formula based on SCr of 3.24 mg/dL (H)).  Lab Results  Component Value Date   CREATININE 3.24 (H) 05/19/2024   CREATININE 0.81 12/02/2020   CREATININE 2.72 (H) 12/01/2020   Lab Results  Component Value Date   NA 138 05/19/2024   CL 102 05/19/2024   K 4.0 05/19/2024   CO2 23 05/19/2024   BUN 45 (H) 05/19/2024   CREATININE 3.24 (H) 05/19/2024   GFRNONAA 19 (L) 05/19/2024   CALCIUM  8.6 (L) 05/19/2024   ALBUMIN  3.6 11/30/2020   GLUCOSE 424 (H) 05/19/2024    Chronic anemia - baseline hg Hemoglobin & Hematocrit  Recent Labs    05/19/24 1428  HGB 11.0*   Iron /TIBC/Ferritin/ %Sat No results found for: IRON , TIBC, FERRITIN, IRONPCTSAT     While in ER:   Vascular  surgery has been consulted Recommending possible angiogram tomorrow for now started on heparin     Lab Orders         CBC with Differential         Basic metabolic panel         APTT         Heparin  level (unfractionated)         Heparin  level (unfractionated)         CBC         APTT         CBG monitoring, ED        Following Medications were ordered in ER: Medications  heparin  ADULT infusion 100 units/mL (25000 units/250mL) (1,600 Units/hr Intravenous New Bag/Given 05/19/24 1919)  lactated ringers  bolus 1,000 mL (1,000 mLs Intravenous New Bag/Given 05/19/24 1915)  heparin  bolus via infusion 3,000 Units (3,000 Units Intravenous Bolus from Bag 05/19/24 1920)    _______________________________________________________ ER Provider Called:    Vascular   Dr. Pearline They Recommend admit to medicine   Will see in AM       ED Triage Vitals  Encounter Vitals Group     BP 05/19/24 1353 133/72     Girls Systolic BP Percentile --      Girls Diastolic BP Percentile --      Boys Systolic BP Percentile --      Boys Diastolic BP Percentile --      Pulse Rate 05/19/24 1353 75     Resp 05/19/24 1353 16     Temp 05/19/24 1353 (!) 97.5 F (36.4 C)     Temp Source 05/19/24 1837 Oral     SpO2 05/19/24 1353 94 %     Weight 05/19/24 1426 211 lb (95.7 kg)     Height 05/19/24 1426 5' 11 (1.803 m)     Head Circumference --      Peak Flow --      Pain Score 05/19/24 1426 5     Pain Loc --      Pain Education --      Exclude from Growth Chart --   UFJK(75)@     _________________________________________ Significant initial  Findings: Abnormal Labs Reviewed  CBC WITH DIFFERENTIAL/PLATELET - Abnormal; Notable  for the following components:      Result Value   Hemoglobin 11.0 (*)    HCT 37.4 (*)    MCH 25.9 (*)    MCHC 29.4 (*)    RDW 18.0 (*)    Neutro Abs 8.0 (*)    All other components within normal limits  BASIC METABOLIC PANEL WITH GFR - Abnormal; Notable for the following  components:   Glucose, Bld 424 (*)    BUN 45 (*)    Creatinine, Ser 3.24 (*)    Calcium  8.6 (*)    GFR, Estimated 19 (*)    All other components within normal limits  CBG MONITORING, ED - Abnormal; Notable for the following components:   Glucose-Capillary 344 (*)    All other components within normal limits       Cardiac Panel (last 3 results) Recent Labs    05/19/24 2042  CKTOTAL 153     ECG: Ordered Personally reviewed and interpreted by me showing: HR : 72 Rhythm:Atrial fibrillation Right bundle branch block Inferior infarct, old QTC 467    The recent clinical data is shown below. Vitals:   05/19/24 1353 05/19/24 1426 05/19/24 1736 05/19/24 1837  BP: 133/72  (!) 137/95   Pulse: 75  75   Resp: 16  14   Temp: (!) 97.5 F (36.4 C)   97.6 F (36.4 C)  TempSrc:    Oral  SpO2: 94%  99%   Weight:  95.7 kg    Height:  5' 11 (1.803 m)      WBC     Component Value Date/Time   WBC 9.2 05/19/2024 1428   LYMPHSABS 0.7 05/19/2024 1428   MONOABS 0.4 05/19/2024 1428   EOSABS 0.1 05/19/2024 1428   BASOSABS 0.0 05/19/2024 1428    Results for orders placed or performed during the hospital encounter of 08/18/23  Surgical pcr screen     Status: None   Collection Time: 08/18/23 11:57 AM   Specimen: Nasal Mucosa; Nasal Swab  Result Value Ref Range Status   MRSA, PCR NEGATIVE NEGATIVE Final   Staphylococcus aureus NEGATIVE NEGATIVE Final    Comment: (NOTE) The Xpert SA Assay (FDA approved for NASAL specimens in patients 27 years of age and older), is one component of a comprehensive surveillance program. It is not intended to diagnose infection nor to guide or monitor treatment. Performed at [facility], [address], [place], [place] [address]       __________________________________________________________ Recent Labs  Lab 05/19/24 1428  NA 138  K 4.0  CO2 23  GLUCOSE 424*  BUN 45*  CREATININE 3.24*  CALCIUM  8.6*    Cr   Up from baseline see  below Lab Results  Component Value Date   CREATININE 3.24 (H) 05/19/2024   CREATININE 0.81 12/02/2020   CREATININE 2.72 (H) 12/01/2020    Recent Labs  Lab 05/19/24 2042  AST 13*  ALT 12  ALKPHOS 66  BILITOT 0.8  PROT 6.4*  ALBUMIN  3.4*   Lab Results  Component Value Date   CALCIUM  8.6 (L) 05/19/2024    Plt: Lab Results  Component Value Date   PLT 231 05/19/2024      Recent Labs  Lab 05/19/24 1428  WBC 9.2  NEUTROABS 8.0*  HGB 11.0*  HCT 37.4*  MCV 88.0  PLT 231    HG/HCT  stable,       Component Value Date/Time   HGB 11.0 (L) 05/19/2024 1428   HGB  13.9 02/01/2020 1219   HCT 37.4 (L) 05/19/2024 1428   HCT 39.6 02/01/2020 1219   MCV 88.0 05/19/2024 1428   MCV 98 (H) 02/01/2020 1219    _______________________________________________ Hospitalist was called for admission for   Critical limb ischemia of right lower extremity with bypass graft    The following Work up has been ordered so far:  Orders Placed This Encounter  Procedures   Critical Care   CBC with Differential   Basic metabolic panel   APTT   Heparin  level (unfractionated)   Heparin  level (unfractionated)   CBC   APTT   Consult to vascular surgery   Consult to hospitalist   heparin  per pharmacy consult   CBG monitoring, ED   VAS US  LOWER EXTREMITY VENOUS (DVT) (7a-7p)   VAS US  LOWER EXTREMITY BYPASS GRAFT DUPL     OTHER Significant initial  Findings:  labs showing:     DM  labs:  HbA1C: No results for input(s): HGBA1C in the last 8760 hours.     CBG (last 3)  Recent Labs    05/19/24 1743  GLUCAP 344*          Cultures: No results found for: SDES, SPECREQUEST, CULT, REPTSTATUS   Radiological Exams on Admission: VAS US  LOWER EXTREMITY BYPASS GRAFT DUPL Result Date: 05/19/2024 LOWER EXTREMITY ARTERIAL DUPLEX STUDY Patient Name:  ANUAR WALGREN [facility]  Date of Exam:   05/19/2024 Medical Rec #: [id number]        Accession #:    [id number] Date of Birth: 04-Nov-1945         Patient Gender: M Patient Age:   78 years Exam Location:  [facility] Procedure:      VAS US  LOWER EXTREMITY BYPASS GRAFT DUPLEX Referring Phys: AMJAD ALI --------------------------------------------------------------------------------  Indications: Severe pain that began at noon today. Tech noted right foot              considerably colder than left foot. High Risk Factors: Hypertension, hyperlipidemia, past history of smoking.  Vascular Interventions: Aorta bi-femoral bypass graft 1998.                         Vascular Interventions: Right TCAR 06/01/2020. Right                         femoral to above                         knee bypass graft 11/30/2020. Right femoral                         endarterectomy. Current ABI:            N/A Comparison Study: Prior ABI done 10/29/23 indicated Right:ABI 0.65 TBI 0.33 Left:                   ABI 0.71 TBI 0.60 Prior LEA done 10/15/21 indicated patent                   fem-pop bypass graft Performing Technologist: Alberta Lis RVS  Examination Guidelines: A complete evaluation includes B-mode imaging, spectral Doppler, color Doppler, and power Doppler as needed of all accessible portions of each vessel. Bilateral testing is considered an integral part of a complete examination. Limited examinations for reoccurring indications may be performed as noted.  +-----------+--------+-----+--------+-----------------+--------+ RIGHT  PSV cm/sRatioStenosisWaveform         Comments +-----------+--------+-----+--------+-----------------+--------+ POP Prox                        severely dampened         +-----------+--------+-----+--------+-----------------+--------+ POP Distal                      severely dampened         +-----------+--------+-----+--------+-----------------+--------+ ATA Prox                occluded                          +-----------+--------+-----+--------+-----------------+--------+ ATA Mid                 occluded                           +-----------+--------+-----+--------+-----------------+--------+ ATA Distal              occluded                          +-----------+--------+-----+--------+-----------------+--------+ PTA Prox                occluded                          +-----------+--------+-----+--------+-----------------+--------+ PTA Mid                 occluded                          +-----------+--------+-----+--------+-----------------+--------+ PTA Distal              occluded                          +-----------+--------+-----+--------+-----------------+--------+ PERO Prox               occluded                          +-----------+--------+-----+--------+-----------------+--------+ PERO Mid                occluded                          +-----------+--------+-----+--------+-----------------+--------+ PERO Distal             occluded                          +-----------+--------+-----+--------+-----------------+--------+  Right Graft #1: Fem-Pop +------------------+--------+--------+--------+--------+                   PSV cm/sStenosisWaveformComments +------------------+--------+--------+--------+--------+ Inflow                                             +------------------+--------+--------+--------+--------+ Prox Anastomosis          occluded                 +------------------+--------+--------+--------+--------+ Proximal Graft            occluded                 +------------------+--------+--------+--------+--------+  Mid Graft                 occluded                 +------------------+--------+--------+--------+--------+ Distal Graft              occluded                 +------------------+--------+--------+--------+--------+ Distal Anastomosis        occluded                 +------------------+--------+--------+--------+--------+ Outflow                   occluded                  +------------------+--------+--------+--------+--------+   Summary: Right: The right femoral-popliteal graft is occluded. No notable 3 vessel run off to foot.  See table(s) above for measurements and observations. Suggest Peripheral Vascular Consult.    Preliminary    VAS US  LOWER EXTREMITY VENOUS (DVT) (7a-7p) Result Date: 05/19/2024  Lower Venous DVT Study Patient Name:  ALIJA RIANO [facility]  Date of Exam:   05/19/2024 Medical Rec #: [id number]        Accession #:    [id number] Date of Birth: Jun 28, 1946        Patient Gender: M Patient Age:   62 years Exam Location:  [facility] Procedure:      VAS US  LOWER EXTREMITY VENOUS (DVT) Referring Phys: AMJAD ALI --------------------------------------------------------------------------------  Indications: Proximal calf pain that began around noon today.  Comparison Study: No prior LEV on file Performing Technologist: Alberta Lis RVS  Examination Guidelines: A complete evaluation includes B-mode imaging, spectral Doppler, color Doppler, and power Doppler as needed of all accessible portions of each vessel. Bilateral testing is considered an integral part of a complete examination. Limited examinations for reoccurring indications may be performed as noted. The reflux portion of the exam is performed with the patient in reverse Trendelenburg.  +---------+---------------+---------+-----------+----------+--------------+ RIGHT    CompressibilityPhasicitySpontaneityPropertiesThrombus Aging +---------+---------------+---------+-----------+----------+--------------+ CFV      Full           Yes      Yes                                 +---------+---------------+---------+-----------+----------+--------------+ SFJ      Full                                                        +---------+---------------+---------+-----------+----------+--------------+ FV Prox  Full           Yes      Yes                                  +---------+---------------+---------+-----------+----------+--------------+ FV Mid   Full                                                        +---------+---------------+---------+-----------+----------+--------------+ FV DistalFull           Yes  Yes                                 +---------+---------------+---------+-----------+----------+--------------+ PFV      Full                                                        +---------+---------------+---------+-----------+----------+--------------+ POP      Full           Yes      Yes                                 +---------+---------------+---------+-----------+----------+--------------+ PTV      Full                                                        +---------+---------------+---------+-----------+----------+--------------+ PERO     Full                                                        +---------+---------------+---------+-----------+----------+--------------+ Gastroc  Full                                                        +---------+---------------+---------+-----------+----------+--------------+   +----+---------------+---------+-----------+----------+--------------+ LEFTCompressibilityPhasicitySpontaneityPropertiesThrombus Aging +----+---------------+---------+-----------+----------+--------------+ CFV Full           Yes      Yes                                 +----+---------------+---------+-----------+----------+--------------+ SFJ Full                                                        +----+---------------+---------+-----------+----------+--------------+    Summary: RIGHT: - There is no evidence of deep vein thrombosis in the lower extremity.  - No cystic structure found in the popliteal fossa.  LEFT: - No evidence of common femoral vein obstruction.   *See table(s) above for measurements and observations. Electronically signed by Norman Serve on 05/19/2024  at 5:18:13 PM.    Final    _______________________________________________________________________________________________________ Latest  Blood pressure (!) 137/95, pulse 75, temperature 97.6 F (36.4 C), temperature source Oral, resp. rate 14, height 5' 11 (1.803 m), weight 95.7 kg, SpO2 99%.   Vitals  labs and radiology finding personally reviewed  Review of Systems:    Pertinent positives include:  right leg cramp  Constitutional:  No weight loss, night sweats, Fevers, chills, fatigue, weight loss  HEENT:  No headaches, Difficulty swallowing,Tooth/dental problems,Sore throat,  No sneezing, itching, ear  ache, nasal congestion, post nasal drip,  Cardio-vascular:  No chest pain, Orthopnea, PND, anasarca, dizziness, palpitations.no Bilateral lower extremity swelling  GI:  No heartburn, indigestion, abdominal pain, nausea, vomiting, diarrhea, change in bowel habits, loss of appetite, melena, blood in stool, hematemesis Resp:  no shortness of breath at rest. No dyspnea on exertion, No excess mucus, no productive cough, No non-productive cough, No coughing up of blood.No change in color of mucus.No wheezing. Skin:  no rash or lesions. No jaundice GU:  no dysuria, change in color of urine, no urgency or frequency. No straining to urinate.  No flank pain.  Musculoskeletal:  No joint pain or no joint swelling. No decreased range of motion. No back pain.  Psych:  No change in mood or affect. No depression or anxiety. No memory loss.  Neuro: no localizing neurological complaints, no tingling, no weakness, no double vision, no gait abnormality, no slurred speech, no confusion  All systems reviewed and apart from HOPI all are negative _______________________________________________________________________________________________ Past Medical History:   Past Medical History:  Diagnosis Date   Acquired equinus deformity of foot 12/15/2011   Anemia    Arthritis    Carotid artery  occlusion    a. Carotid US  10/16: RICA 60-79%, L CEA patent with 1-39% stenosis   CKD (chronic kidney disease)    stage 3-4   Coagulation disorder 05/25/2019   Coronary artery disease    a. Myoview  9/16:  EF 52%, inferior fixed defect consistent with diaphragmatic attenuation, intermediate risk secondary to poor exercise tolerance and symptoms during stress;  b. LHC 05/17/2015 90% mid LAD, 80% ost D2, 75% mid RCA, 35% prox RCA. >> S/p CABG   Coronary artery disease involving coronary bypass graft of native heart with angina pectoris    CORONARY ARTERY BYPASS GRAFTING x 3 - 05/23/2015 Left internal mammary artery to left anterior descending  Saphenous vein graft to diagonal  Saphenous vein graft to posterior descending Endoscopic greater saphenous vein harvest right thigh     Developmental delay    Diabetes mellitus age 22   Diverticulitis    Diverticulosis of colon without hemorrhage 05/17/2015   By CT scan    DM type 2, controlled, with complication ([name]) 04/29/2015   Essential hypertension 04/29/2015   GERD (gastroesophageal reflux disease)    H/O hiatal hernia    History of echocardiogram    a. Echo 9/16: GLS -15.2%, EF 55-60%, grade 1 diastolic dysfunction, normal wall motion, aortic sclerosis, dilated aortic root 39 mm, atrial septal lipomatous hypertrophy   Hyperlipidemia    Hypertension    Irritable bowel syndrome (IBS)    Myocardial infarction ([name])    silent inferior MI; patient denies MI history (03/17/13)    Neuralgia    Neuropathy 2013   OSA on CPAP 05/30/2015   Osteoporosis 2013   PAF (paroxysmal atrial fibrillation) ([name])    post CABG 2016; Amiodarone  stopped 2/2 wheezing; PAF 01/12/20   Peripheral vascular disease    Plantar fasciitis 12/15/2011   Pneumonia    Reflux esophagitis    Right bundle branch block 04/30/2015   Sleep apnea    uses CPAP   Stenosis of right internal carotid artery with cerebral infarction ([name]) 12/09/2011   Stress fracture of metatarsal bone  02/18/2012   Stroke ([name]) 1987   Right brain stroke- slight drop on left side   Syncope 01/12/2020   Type 2 diabetes mellitus with vascular disease ([name]) 05/30/2015   Wears glasses  Past Surgical History:  Procedure Laterality Date   ABDOMINAL AORTOGRAM W/LOWER EXTREMITY Bilateral 11/27/2020   Procedure: ABDOMINAL AORTOGRAM W/LOWER EXTREMITY;  Surgeon: Serene Gaile ORN, MD;  Location: [facility] INVASIVE CV LAB;  Service: Cardiovascular;  Laterality: Bilateral;   ANTERIOR CERVICAL DECOMP/DISCECTOMY FUSION N/A 08/25/2023   Procedure: CERVICAL THREE-FOUR, CERVICAL FOUR-FIVE, CERVICAL FIVE-SIX ANTERIOR CERVICAL DECOMPRESSION/DISCECTOMY FUSION;  Surgeon: Debby Dorn MATSU, MD;  Location: [facility] OR;  Service: Neurosurgery;  Laterality: N/A;   CARDIAC CATHETERIZATION N/A 05/17/2015   Procedure: Left Heart Cath and Coronary Angiography;  Surgeon: Victory ORN Sharps, MD;  Location: [facility] INVASIVE CV LAB;  Service: Cardiovascular;  Laterality: N/A;   CAROTID ENDARTERECTOMY  1994 & redo 2001   Left   CATARACT EXTRACTION W/ INTRAOCULAR LENS IMPLANT Bilateral    COLONOSCOPY W/ BIOPSIES AND POLYPECTOMY     CORONARY ARTERY BYPASS GRAFT N/A 05/23/2015   Procedure: CORONARY ARTERY BYPASS GRAFTING (CABG) x 3 (LIMA to LAD, SVG to DIAGONAL 2, SVG to PDA) with Endoscopic Vein Harvesting from right greater saphenous vein;  Surgeon: Dallas KATHEE Jude, MD;  Location: [facility] OR;  Service: Open Heart Surgery;  Laterality: N/A;   ENDARTERECTOMY FEMORAL Right 11/30/2020   Procedure: ENDARTERECTOMY FEMORAL RIGHT;  Surgeon: Oris Krystal FALCON, MD;  Location[facility] OR;  Service: Vascular;  Laterality: Right;   FEMORAL-POPLITEAL BYPASS GRAFT Right 11/30/2020   Procedure: BYPASS GRAFT FEMORAL-POPLITEAL ARTERY RIGHT;  Surgeon: Oris Krystal FALCON, MD;  Location: [facility] OR;  Service: Vascular;  Laterality: Right;   FRACTURE SURGERY  2013   Right   foo  t X's 2   ILIAC ARTERY STENT     LAPAROSCOPIC CHOLECYSTECTOMY  09/11/2016   POSTERIOR LUMBAR FUSION   03/24/2013   Level 1   PR VEIN BYPASS GRAFT,AORTO-FEM-POP  1998   SPINE SURGERY  2014   TEE WITHOUT CARDIOVERSION N/A 05/23/2015   Procedure: TRANSESOPHAGEAL ECHOCARDIOGRAM (TEE);  Surgeon: Dallas KATHEE Jude, MD;  Location: [facility] OR;  Service: Open Heart Surgery;  Laterality: N/A;   TONSILLECTOMY     TRANSCAROTID ARTERY REVASCULARIZATION  Right 06/01/2020   Procedure: RIGHT TRANSCAROTID ARTERY REVASCULARIZATION;  Surgeon: Serene Gaile ORN, MD;  Location: [facility] OR;  Service: Vascular;  Laterality: Right;   TRIGGER FINGER RELEASE Right 07/14/2017   Procedure: RIGHT LONG FINGER TRIGGER RELEASE;  Surgeon: Sebastian Lenis, MD;  Location: [facility];  Service: Orthopedics;  Laterality: Right;    Social History:  Ambulatory   walker       reports that he quit smoking about 15 years ago. His smoking use included cigarettes. He started smoking about 65 years ago. He has a 100 pack-year smoking history. He has never used smokeless tobacco. He reports that he does not drink alcohol and does not use drugs.   Family History:   Family History  Problem Relation Age of Onset   Cancer Mother 75       pancreatic   Hyperlipidemia Mother    Stroke Father 36   Deep vein thrombosis Father    Hyperlipidemia Father    Hypertension Father    ______________________________________________________________________________________________ Allergies: Allergies  Allergen Reactions   Doxazosin Mesylate     Other reaction(s): Diarrhea, dizziness   Hydrocodone Nausea Only   Niacin     Other reaction(s): Skin irritation   Oxycodone  Nausea Only   Penicillins Rash    Has patient had a PCN reaction causing immediate rash, facial/tongue/throat swelling, SOB or lightheadedness with hypotension: NO Has patient had a PCN reaction causing severe rash involving  mucus membranes or skin necrosis:NO Has patient had a PCN reaction that required hospitalization NO Has patient had a PCN reaction occurring  within the last 10 years: NO If all of the above answers are NO, then may proceed with Cephalosporin use.    Sulfa Drugs Cross Reactors Rash     Prior to Admission medications   Medication Sig Start Date End Date Taking? Authorizing Provider  amLODipine  (NORVASC ) 10 MG tablet Take 1 tablet (10 mg total) by mouth daily. 03/26/20  Yes Claudene Victory ORN, MD  apixaban  (ELIQUIS ) 5 MG TABS tablet Take 1 tablet (5 mg total) by mouth 2 (two) times daily. 08/13/21  Yes Claudene Victory ORN, MD  aspirin  EC 81 MG tablet Take 81 mg by mouth daily. Swallow whole.   Yes [provider]  atorvastatin  (LIPITOR) 40 MG tablet Take 40 mg by mouth daily.   Yes [provider]  Cholecalciferol  (VITAMIN D3) 125 MCG (5000 UT) TABS Take 5,000 Units by mouth daily.    Yes [provider]  dexlansoprazole (DEXILANT) 60 MG capsule Take 60 mg by mouth daily.    Yes [provider]  famotidine  (PEPCID ) 20 MG tablet Take 20 mg by mouth at bedtime. 01/22/22  Yes [provider]  gabapentin  (NEURONTIN ) 300 MG capsule Take 600 mg by mouth at bedtime.  02/20/19  Yes [provider]  HUMALOG  KWIKPEN 100 UNIT/ML KwikPen Inject 25-30 Units into the skin 3 (three) times daily. 05/15/23  Yes [provider]  hydrALAZINE  (APRESOLINE ) 25 MG tablet Take 1 tablet (25 mg total) by mouth in the morning and at bedtime. 10/08/21  Yes Claudene Victory ORN, MD  metoprolol  tartrate (LOPRESSOR ) 50 MG tablet TAKE 1 AND 1/2 TABLETS BY MOUTH TWICE DAILY 01/17/22  Yes Claudene Victory ORN, MD  testosterone  cypionate (DEPOTESTOSTERONE CYPIONATE) 200 MG/ML injection Inject 200 mg into the muscle once a week. 03/17/19  Yes [provider]  torsemide  (DEMADEX ) 20 MG tablet Take 1 tablet (20 mg total) by mouth daily. 10/08/21  Yes Claudene Victory ORN, MD  TRESIBA  FLEXTOUCH 100 UNIT/ML SOPN FlexTouch Pen Inject 25 Units into the skin daily. 03/30/19  Yes [provider]  Dulaglutide  (TRULICITY ) 3 MG/0.5ML SOPN  Inject 3 mg into the skin every Sunday. 05/14/23   [provider]  hydrocortisone  (ANUSOL -HC) 2.5 % rectal cream Place 1 application rectally daily as needed for hemorrhoids.     [provider]  ipratropium (ATROVENT) 0.03 % nasal spray Place 2 sprays into both nostrils 2 (two) times daily. Patient not taking: Reported on 05/19/2024 03/21/24   [provider]  ketoconazole  (NIZORAL ) 2 % shampoo Apply 1 application topically daily as needed for irritation.  02/19/19   [provider]  Melatonin 5 MG CHEW Chew 10 mg by mouth at bedtime. Patient not taking: Reported on 05/19/2024 10/18/20   [provider]  methocarbamol  (ROBAXIN ) 500 MG tablet Take 500 mg by mouth every 6 (six) hours as needed for muscle spasms.  12/08/19   [provider]  nitroGLYCERIN  (NITROSTAT ) 0.3 MG SL tablet Place 1 tablet (0.3 mg total) under the tongue every 5 (five) minutes as needed for chest pain. Only as needed 10/08/21   Claudene Victory ORN, MD  traMADol  (ULTRAM ) 50 MG tablet Take 1-2 tablets (50-100 mg total) by mouth every 6 (six) hours as needed for moderate pain (pain score 4-6) or severe pain (pain score 7-10). Patient not taking: Reported on 05/19/2024 08/26/23   Tomlinson, Sara Caylin, PA-C  ___________________________________________________________________________________________________ Physical Exam:    05/19/2024    5:36 PM 05/19/2024    2:26 PM 05/19/2024    1:53 PM  Vitals with BMI  Height  5' 11   Weight  211 lbs   BMI  29.44   Systolic 137  133  Diastolic 95  72  Pulse 75  75     1. General:  in No  Acute distress    Chronically ill -appearing 2. Psychological: Alert and   Oriented 3. Head/ENT:  Dry Mucous Membranes                          Head Non traumatic, neck supple                         Poor Dentition 4. SKIN: decreased Skin turgor,  Skin clean Dry and intact no rash    5. Heart: Regular rate and rhythm no  Murmur, no Rub or gallop 6.  Lungs: no wheezes or crackles   7. Abdomen: Soft,  non-tender, Non distended   obese  bowel sounds present 8. Lower extremities: no clubbing, cyanosis, 1 + edema right lower extremity cool to the touch diminished pulses 9. Neurologically Grossly intact, moving all 4 extremities equally   10. MSK: Normal range of motion    Chart has been reviewed  ______________________________________________________________________________________________  Assessment/Plan  78 y.o. male with medical history significant of HTN, HLD, DM2, PAD, anemia, CKD 3b, CAD sp CABG, diastolic CHF, Neuropathy, OSA, A.fib eliquis , RBBB, CvA  Admitted for   Critical limb ischemia of right lower extremity with bypass graft     Present on Admission:  CAD (coronary artery disease)  Diabetic neuropathy ([name])  DM type 2, controlled, with complication ([name])  Essential hypertension  Hyperlipidemia  PAD (peripheral artery disease)  PAF (paroxysmal atrial fibrillation) ([name])  Critical limb ischemia of right lower extremity ([name])  CKD (chronic kidney disease) stage 4, GFR 15-29 ml/min ([name])     CAD (coronary artery disease) Resume Lipitor 40 mg a day and metoprolol  50 mg daily when able to tolerate  Diabetic neuropathy ([name]) Resume Neurontin   DM type 2, controlled, with complication ([name])  - Order Sensitive SSI   - continue home insulin  but decreased Tresiba  to  15 units,  -  check TSH and HgA1C  - Hold by mouth medications    Essential hypertension Monitor blood pressure carefully resume Norvasc  and Toprol   Hyperlipidemia Continue Lipitor at 20 mg a day  OSA on CPAP Resume CPAP per home with parameters  PAD (peripheral artery disease) Appreciate vascular surgery consult. Continue heparin  May need angiogram tomorrow be mindful of patient's significant CKD and diminished renal function  PAF (paroxysmal atrial fibrillation) ([name]) Holding Eliquis  patient is currently on heparin  continue metoprolol  75 mg po  bid if able to tolerate  Critical limb ischemia of right lower extremity ([name]) Appreciate vascular surgery consult N.p.o. postmidnight for possible angiogram tomorrow continue heparin  Of note patient does has known CKD will need to monitor kidney function need to assess risk and benefit of undergoing angiogram Patient is currently pain-free States he has had coolness in his right lower extremity ever since January  CKD (chronic kidney disease) stage 4, GFR 15-29 ml/min ([name])  -chronic avoid nephrotoxic medications such as NSAIDs, Vanco Zosyn combo,  avoid hypotension, continue to follow renal function    Other plan as per orders.  DVT prophylaxis: heparin   Code Status:    Code Status: Prior FULL CODE  as per patient  I had personally discussed CODE STATUS with patient and family   ACP   none   Family Communication:   Family   at  Bedside  plan of care was discussed  with   Wife,    Diet n.p.o. postmidnight   Disposition Plan:       To home once workup is complete and patient is stable   Following barriers for discharge:                                                         Electrolytes corrected                                                           Pain controlled with PO medications                                                        Will need consultants to evaluate patient prior to discharge                               Consult Orders  (From admission, onward)           Start     Ordered   05/19/24 1805  Consult to hospitalist  Paged By Nichole  Once       Provider:  (Not yet assigned)  Question Answer Comment  Place call to: [facility]   Reason for Consult Admit      05/19/24 1804                Consults called:   Treatment Team:  Pearline Norman RAMAN, MD  Admission status:  ED Disposition     ED Disposition  Admit   Condition  --   Comment  Hospital Area: [facility] [[id number]]  Level of Care: Progressive [102]   Admit to Progressive based on following criteria: MULTISYSTEM THREATS such as stable sepsis, metabolic/electrolyte imbalance with or without encephalopathy that is responding to early treatment.  [facility] place patient in [facility] at [facility] or [facility] if equivalent level of care is available:: No  Covid Evaluation: Asymptomatic - no recent exposure (last 10 days) testing not required  Diagnosis: Critical limb ischemia of right lower extremity [facility]) [[id number]]  Admitting Physician: Eulas Schweitzer [[id number]]  Attending Physician: Eleazar Kimmey [[id number]]  For patients discharging to extended facilities (i.e. SNF, AL, group homes or LTAC) initiate:: Discharge to SNF/Facility Placement COVID-19 Lab Testing Protocol           Obs     Level of care       progressive        Anona Giovannini 05/20/2024, 12:53 AM    [facility]     after 2 AM please page floor coverage   If 7AM-7PM, please contact the  day team taking care of the patient using [email or web address]

## 2024-05-19 NOTE — Consult Note (Addendum)
 Hospital Consult    Reason for Consult:  pain right leg Requesting Physician:  ER MRN #:  [id number]  History of Present Illness: This is a 78 y.o. male who presented to the hospital with pain/cramping in the right leg earlier today.  They live in [place] and the fire fighters arrived 1st and helped put him in the car and his wife drove him to [facility] bc the EMS would have taken him to [facility] and his doctors are here at [facility].    He has hx of right femoral above-knee popliteal artery bypass on 11/30/2020 with Dr. Oris.  He also underwent an aortobifemoral bypass in 1998.  He underwent left carotid endarterectomy in 1994 and redo in 2001.  Right TCAR was performed 06/01/20.   He was seen in our office in May 2025, he was having some balance issues and not able to walk as much as he would like.  He was not having any rest pain or claudication and the wound on his 4th toe had healed.  He had been seen by Dr. Magda in March and at that time, he noted the right leg bypass was clinically occluded.   They discussed intervention vs watchful waiting and they decided on watchful waiting.   He has hx of cervical spine surgery.  He has hx of CAD with CAGB, HLD, HTN, DM, PAF on Eliquis .   He has CKD 3-4 and his creatinine today is 3.24.    The pt is on a statin for cholesterol management.  The pt is on a daily aspirin .   Other AC:  eliquis  The pt is on CCB, hydralazine , BB for hypertension.   The pt is  on medication for diabetes PTA. Tobacco hx:  former  Past Medical History:  Diagnosis Date   Acquired equinus deformity of foot 12/15/2011   Anemia    Arthritis    Carotid artery occlusion    a. Carotid US  10/16: RICA 60-79%, L CEA patent with 1-39% stenosis   CKD (chronic kidney disease)    stage 3-4   Coagulation disorder 05/25/2019   Coronary artery disease    a. Myoview  9/16:  EF 52%, inferior fixed defect consistent with diaphragmatic attenuation, intermediate risk secondary to poor exercise  tolerance and symptoms during stress;  b. LHC 05/17/2015 90% mid LAD, 80% ost D2, 75% mid RCA, 35% prox RCA. >> S/p CABG   Coronary artery disease involving coronary bypass graft of native heart with angina pectoris    CORONARY ARTERY BYPASS GRAFTING x 3 - 05/23/2015 Left internal mammary artery to left anterior descending  Saphenous vein graft to diagonal  Saphenous vein graft to posterior descending Endoscopic greater saphenous vein harvest right thigh     Developmental delay    Diabetes mellitus age 81   Diverticulitis    Diverticulosis of colon without hemorrhage 05/17/2015   By CT scan    DM type 2, controlled, with complication ([name]) 04/29/2015   Essential hypertension 04/29/2015   GERD (gastroesophageal reflux disease)    H/O hiatal hernia    History of echocardiogram    a. Echo 9/16: GLS -15.2%, EF 55-60%, grade 1 diastolic dysfunction, normal wall motion, aortic sclerosis, dilated aortic root 39 mm, atrial septal lipomatous hypertrophy   Hyperlipidemia    Hypertension    Irritable bowel syndrome (IBS)    Myocardial infarction ([name])    silent inferior MI; patient denies MI history (03/17/13)    Neuralgia    Neuropathy 2013   OSA on  CPAP 05/30/2015   Osteoporosis 2013   PAF (paroxysmal atrial fibrillation) ([name])    post CABG 2016; Amiodarone  stopped 2/2 wheezing; PAF 01/12/20   Peripheral vascular disease    Plantar fasciitis 12/15/2011   Pneumonia    Reflux esophagitis    Right bundle branch block 04/30/2015   Sleep apnea    uses CPAP   Stenosis of right internal carotid artery with cerebral infarction ([name]) 12/09/2011   Stress fracture of metatarsal bone 02/18/2012   Stroke ([name]) 1987   Right brain stroke- slight drop on left side   Syncope 01/12/2020   Type 2 diabetes mellitus with vascular disease ([name]) 05/30/2015   Wears glasses     Past Surgical History:  Procedure Laterality Date   ABDOMINAL AORTOGRAM W/LOWER EXTREMITY Bilateral 11/27/2020   Procedure: ABDOMINAL  AORTOGRAM W/LOWER EXTREMITY;  Surgeon: Serene Gaile ORN, MD;  Location: [facility] INVASIVE CV LAB;  Service: Cardiovascular;  Laterality: Bilateral;   ANTERIOR CERVICAL DECOMP/DISCECTOMY FUSION N/A 08/25/2023   Procedure: CERVICAL THREE-FOUR, CERVICAL FOUR-FIVE, CERVICAL FIVE-SIX ANTERIOR CERVICAL DECOMPRESSION/DISCECTOMY FUSION;  Surgeon: Debby Dorn MATSU, MD;  Location: [facility] OR;  Service: Neurosurgery;  Laterality: N/A;   CARDIAC CATHETERIZATION N/A 05/17/2015   Procedure: Left Heart Cath and Coronary Angiography;  Surgeon: Victory ORN Sharps, MD;  Location: [facility] INVASIVE CV LAB;  Service: Cardiovascular;  Laterality: N/A;   CAROTID ENDARTERECTOMY  1994 & redo 2001   Left   CATARACT EXTRACTION W/ INTRAOCULAR LENS IMPLANT Bilateral    COLONOSCOPY W/ BIOPSIES AND POLYPECTOMY     CORONARY ARTERY BYPASS GRAFT N/A 05/23/2015   Procedure: CORONARY ARTERY BYPASS GRAFTING (CABG) x 3 (LIMA to LAD, SVG to DIAGONAL 2, SVG to PDA) with Endoscopic Vein Harvesting from right greater saphenous vein;  Surgeon: Dallas KATHEE Jude, MD;  Location: [facility] OR;  Service: Open Heart Surgery;  Laterality: N/A;   ENDARTERECTOMY FEMORAL Right 11/30/2020   Procedure: ENDARTERECTOMY FEMORAL RIGHT;  Surgeon: Oris Krystal FALCON, MD;  Location: [facility] OR;  Service: Vascular;  Laterality: Right;   FEMORAL-POPLITEAL BYPASS GRAFT Right 11/30/2020   Procedure: BYPASS GRAFT FEMORAL-POPLITEAL ARTERY RIGHT;  Surgeon: Oris Krystal FALCON, MD;  Location: [facility] OR;  Service: Vascular;  Laterality: Right;   FRACTURE SURGERY  2013   Right   foo  t X's 2   ILIAC ARTERY STENT     LAPAROSCOPIC CHOLECYSTECTOMY  09/11/2016   POSTERIOR LUMBAR FUSION  03/24/2013   Level 1   PR VEIN BYPASS GRAFT,AORTO-FEM-POP  1998   SPINE SURGERY  2014   TEE WITHOUT CARDIOVERSION N/A 05/23/2015   Procedure: TRANSESOPHAGEAL ECHOCARDIOGRAM (TEE);  Surgeon: Dallas KATHEE Jude, MD;  Location: [facility] OR;  Service: Open Heart Surgery;  Laterality: N/A;   TONSILLECTOMY     TRANSCAROTID ARTERY  REVASCULARIZATION  Right 06/01/2020   Procedure: RIGHT TRANSCAROTID ARTERY REVASCULARIZATION;  Surgeon: Serene Gaile ORN, MD;  Location: [facility] OR;  Service: Vascular;  Laterality: Right;   TRIGGER FINGER RELEASE Right 07/14/2017   Procedure: RIGHT LONG FINGER TRIGGER RELEASE;  Surgeon: Sebastian Lenis, MD;  Location:  SURGERY CENTER;  Service: Orthopedics;  Laterality: Right;    Allergies  Allergen Reactions   Doxazosin Mesylate     Other reaction(s): Diarrhea, dizziness   Hydrocodone Nausea Only   Niacin     Other reaction(s): Skin irritation   Oxycodone  Nausea Only   Penicillins Rash    Has patient had a PCN reaction causing immediate rash, facial/tongue/throat swelling, SOB or lightheadedness with hypotension: NO Has patient had a PCN reaction  causing severe rash involving mucus membranes or skin necrosis:NO Has patient had a PCN reaction that required hospitalization NO Has patient had a PCN reaction occurring within the last 10 years: NO If all of the above answers are NO, then may proceed with Cephalosporin use.    Sulfa Drugs Cross Reactors Rash    Prior to Admission medications   Medication Sig Start Date End Date Taking? Authorizing Provider  amLODipine  (NORVASC ) 10 MG tablet Take 1 tablet (10 mg total) by mouth daily. 03/26/20  Yes Claudene Victory ORN, MD  apixaban  (ELIQUIS ) 5 MG TABS tablet Take 1 tablet (5 mg total) by mouth 2 (two) times daily. 08/13/21  Yes Claudene Victory ORN, MD  aspirin  EC 81 MG tablet Take 81 mg by mouth daily. Swallow whole.   Yes [provider]  atorvastatin  (LIPITOR) 40 MG tablet Take 40 mg by mouth daily.   Yes [provider]  Cholecalciferol  (VITAMIN D3) 125 MCG (5000 UT) TABS Take 5,000 Units by mouth daily.    Yes [provider]  dexlansoprazole (DEXILANT) 60 MG capsule Take 60 mg by mouth daily.    Yes [provider]  famotidine  (PEPCID ) 20 MG tablet Take 20 mg by mouth at bedtime. 01/22/22  Yes [provider]  gabapentin  (NEURONTIN ) 300 MG capsule Take 600 mg by mouth at bedtime.  02/20/19  Yes [provider]  HUMALOG  KWIKPEN 100 UNIT/ML KwikPen Inject 25-30 Units into the skin 3 (three) times daily. 05/15/23  Yes [provider]  hydrALAZINE  (APRESOLINE ) 25 MG tablet Take 1 tablet (25 mg total) by mouth in the morning and at bedtime. 10/08/21  Yes Claudene Victory ORN, MD  metoprolol  tartrate (LOPRESSOR ) 50 MG tablet TAKE 1 AND 1/2 TABLETS BY MOUTH TWICE DAILY 01/17/22  Yes Claudene Victory ORN, MD  testosterone  cypionate (DEPOTESTOSTERONE CYPIONATE) 200 MG/ML injection Inject 200 mg into the muscle once a week. 03/17/19  Yes [provider]  torsemide  (DEMADEX ) 20 MG tablet Take 1 tablet (20 mg total) by mouth daily. 10/08/21  Yes Claudene Victory ORN, MD  TRESIBA  FLEXTOUCH 100 UNIT/ML SOPN FlexTouch Pen Inject 25 Units into the skin daily. 03/30/19  Yes [provider]  Dulaglutide  (TRULICITY ) 3 MG/0.5ML SOPN Inject 3 mg into the skin every Sunday. 05/14/23   [provider]  hydrocortisone  (ANUSOL -HC) 2.5 % rectal cream Place 1 application rectally daily as needed for hemorrhoids.     [provider]  ipratropium (ATROVENT) 0.03 % nasal spray Place 2 sprays into both nostrils 2 (two) times daily. Patient not taking: Reported on 05/19/2024 03/21/24   [provider]  ketoconazole  (NIZORAL ) 2 % shampoo Apply 1 application topically daily as needed for irritation.  02/19/19   [provider]  Melatonin 5 MG CHEW Chew 10 mg by mouth at bedtime. Patient not taking: Reported on 05/19/2024 10/18/20   [provider]  methocarbamol  (ROBAXIN ) 500 MG tablet Take 500 mg by mouth every 6 (six) hours as needed for muscle spasms.  12/08/19   [provider]  nitroGLYCERIN  (NITROSTAT ) 0.3 MG SL tablet Place 1 tablet (0.3 mg total) under the tongue every 5 (five) minutes as needed for chest pain. Only as needed 10/08/21   Claudene Victory ORN, MD   traMADol  (ULTRAM ) 50 MG tablet Take 1-2 tablets (50-100 mg total) by mouth every 6 (six) hours as needed for moderate pain (pain score 4-6) or severe pain (pain score 7-10). Patient not taking: Reported on 05/19/2024 08/26/23   Johnanna,  Camie Mates, PA-C    Social History   Socioeconomic History   Marital status: Married    Spouse name: Not on file   Number of children: 2   Years of education: Not on file   Highest education level: Not on file  Occupational History   Not on file  Tobacco Use   Smoking status: Former    Current packs/day: 0.00    Average packs/day: 2.0 packs/day for 50.0 years (100.0 ttl pk-yrs)    Types: Cigarettes    Start date: 03/27/1959    Quit date: 03/26/2009    Years since quitting: 15.1   Smokeless tobacco: Never  Vaping Use   Vaping status: Never Used  Substance and Sexual Activity   Alcohol use: No   Drug use: No   Sexual activity: Yes  Other Topics Concern   Not on file  Social History Narrative   Lives with wife   Caffeine coffee 3 c daily, diet coke   Social Drivers of Corporate investment banker Strain: Not on file  Food Insecurity: Not on file  Transportation Needs: Not on file  Physical Activity: Not on file  Stress: Not on file  Social Connections: Not on file  Intimate Partner Violence: Not on file    Family History  Problem Relation Age of Onset   Cancer Mother 2       pancreatic   Hyperlipidemia Mother    Stroke Father 80   Deep vein thrombosis Father    Hyperlipidemia Father    Hypertension Father     ROS: [x]  Positive   [ ]  Negative   [ ]  All sytems reviewed and are negative  Cardiac: [x]  CAD hx of CABG [x]  PAF  Vascular: [x]  pain/cramping in right leg  Pulmonary: [x]  OSA on CPAP  Neurologic: []  hx of CVA []  mini stroke   Hematologic: []  hx of cancer  Endocrine:   [x]  diabetes []  thyroid  disease  GI [x]  GERD [x]  IBS [x]  diverticulitis  GU: [x]  CKD/renal failure []  HD--[]  M/W/F or []   T/T/S  Psychiatric: []  anxiety []  depression  Musculoskeletal: [x]  arthritis  Integumentary: []  rashes []  ulcers  Constitutional: []  fever  []  chills  Physical Examination  Vitals:   05/19/24 1900 05/19/24 1945  BP: 126/69 126/63  Pulse: 64 72  Resp: 15 12  Temp:    SpO2: 99% 100%   Body mass index is 29.43 kg/m.  General:  WDWN in NAD Gait: Not observed HENT: WNL, normocephalic Pulmonary: normal non-labored breathing Cardiac: regular Abdomen:  soft, NT Skin: without rashes Vascular Exam/Pulses: Left PT doppler signal present Doppler signals were not appreciated in the right foot.  He has a faint popliteal doppler signal.  He has easily palpable femoral pulses bilaterally Extremities: motor and sensory are in tact.  The right foot is slightly cooler than the left foot.  Musculoskeletal: no muscle wasting or atrophy  Neurologic: A&O X 3 Psychiatric:  The pt has Normal affect.   CBC    Component Value Date/Time   WBC 9.2 05/19/2024 1428   RBC 4.25 05/19/2024 1428   HGB 11.0 (L) 05/19/2024 1428   HGB 13.9 02/01/2020 1219   HCT 37.4 (L) 05/19/2024 1428   HCT 39.6 02/01/2020 1219   PLT 231 05/19/2024 1428   PLT 258 02/01/2020 1219   MCV 88.0 05/19/2024 1428   MCV 98 (H) 02/01/2020 1219   MCH 25.9 (L) 05/19/2024 1428   MCHC 29.4 (L) 05/19/2024 1428   RDW  18.0 (H) 05/19/2024 1428   RDW 13.1 02/01/2020 1219   LYMPHSABS 0.7 05/19/2024 1428   MONOABS 0.4 05/19/2024 1428   EOSABS 0.1 05/19/2024 1428   BASOSABS 0.0 05/19/2024 1428    BMET    Component Value Date/Time   NA 138 05/19/2024 1428   NA 134 02/01/2020 1219   K 4.0 05/19/2024 1428   CL 102 05/19/2024 1428   CO2 23 05/19/2024 1428   GLUCOSE 424 (H) 05/19/2024 1428   BUN 45 (H) 05/19/2024 1428   BUN 21 02/01/2020 1219   CREATININE 3.24 (H) 05/19/2024 1428   CREATININE 1.76 (H) 09/19/2015 1111   CALCIUM  8.6 (L) 05/19/2024 1428   GFRNONAA 19 (L) 05/19/2024 1428   GFRAA 41 (L) 02/01/2020 1219     COAGS: Lab Results  Component Value Date   INR 1.0 11/30/2020   INR 1.0 06/01/2020   INR WILL FOLLOW 10/21/2016   INR 1.0 10/21/2016     Non-Invasive Vascular Imaging:    RLE arterial duplex 05/19/2024 +-----------+--------+-----+--------+-----------------+--------+  RIGHT     PSV cm/sRatioStenosisWaveform         Comments  +-----------+--------+-----+--------+-----------------+--------+  POP Prox                        severely dampened          +-----------+--------+-----+--------+-----------------+--------+  POP Distal                      severely dampened          +-----------+--------+-----+--------+-----------------+--------+  ATA Prox                occluded                           +-----------+--------+-----+--------+-----------------+--------+  ATA Mid                 occluded                           +-----------+--------+-----+--------+-----------------+--------+  ATA Distal              occluded                           +-----------+--------+-----+--------+-----------------+--------+  PTA Prox                occluded                           +-----------+--------+-----+--------+-----------------+--------+  PTA Mid                 occluded                           +-----------+--------+-----+--------+-----------------+--------+  PTA Distal              occluded                           +-----------+--------+-----+--------+-----------------+--------+  PERO Prox               occluded                           +-----------+--------+-----+--------+-----------------+--------+  PERO Mid  occluded                           +-----------+--------+-----+--------+-----------------+--------+  PERO Distal             occluded                           +-----------+--------+-----+--------+-----------------+--------+    Right Graft #1: Fem-Pop   +------------------+--------+--------+--------+--------+                   PSV cm/sStenosisWaveformComments  +------------------+--------+--------+--------+--------+  Inflow                                             +------------------+--------+--------+--------+--------+  Prox Anastomosis          occluded                  +------------------+--------+--------+--------+--------+  Proximal Graft            occluded                  +------------------+--------+--------+--------+--------+  Mid Graft                 occluded                  +------------------+--------+--------+--------+--------+  Distal Graft              occluded                  +------------------+--------+--------+--------+--------+  Distal Anastomosis        occluded                  +------------------+--------+--------+--------+--------+  Outflow                  occluded                  +------------------+--------+--------+--------+--------+   Summary:  Right: The right femoral-popliteal graft is occluded. No notable 3 vessel run off to foot.   DVT study 05/19/2024 Right - There is no evidence of deep vein thrombosis in the lower extremity.  - No cystic structure found in the popliteal fossa.    LEFT:  - No evidence of common femoral vein obstruction    ASSESSMENT/PLAN: This is a 78 y.o. male with hx of right femoral above-knee popliteal artery bypass on 11/30/2020 with Dr. Oris.  He also underwent an aortobifemoral bypass in 1998.  He underwent left carotid endarterectomy in 1994 and redo in 2001.  Right TCAR was performed 06/01/20.  He presented to the ED today with right foot/leg pain and cramping that was severe earlier today and has eased up since arriving to the hospital.   -pt seen with Dr. Pearline.  Unable to obtain doppler flow in the right foot.  He has a faint doppler signal in the right popliteal and easily palpable femoral pulses bilaterally.   Dr. Pearline discussed with pt and we will have medicine admit patient and will start heparin  gtt.  Given his pain/cramping has improved, and motor and sensory are intact right foot, not emergent need to proceed to OR currently.   -he may need angiogram at some point, however, he has been on Eliquis  and his creatinine is 3.24 today.   -admit to [facility] [address] with medicine   Lucie  Rhyne, PA-C [facility] [phone]  VASCULAR STAFF ADDENDUM: I have independently interviewed and examined the patient. I agree with the above.  12-year-old male with complex vascular history with an aortobifemoral bypass done in 1998 and a right femoral to above-knee popliteal artery bypass on 11/30/20.  He was last seen by Dr. Magda in March and the right femoral to above-knee popliteal artery bypass was presumed to be occluded based on physical exam although there was no imaging at that time. Today he began having severe right calf and foot pain which relieved after 2 hours.  I am unable to obtain Doppler flow at the ankle or foot although he is motor and sensory intact and currently denying pain. Will plan for heparin  drip with a medicine admission and reevaluate tomorrow.  May potentially benefit from angiogram to better assess his perfusion although his creatinine is elevated at 3.1 and usually trends are in the 2s and also took his Eliquis  this morning.  Norman GORMAN Serve MD [facility] Phone Number: [phone] 05/19/2024 9:07 PM

## 2024-05-19 NOTE — Progress Notes (Signed)
 VASCULAR LAB    Right lower extremity venous duplex has been performed.  See CV proc for preliminary results.  Relayed results to Susette Lash, PA-C via secure chat  Jaleya Pebley, RVT 05/19/2024, 5:06 PM

## 2024-05-19 NOTE — Progress Notes (Signed)
 VASCULAR LAB    Duplex of right fem-pop graft has been performed.  See CV proc for preliminary results.  Relayed results to Susette Lash, PA-C via secure chat  Lunna Vogelgesang, RVT 05/19/2024, 5:07 PM

## 2024-05-19 NOTE — ED Provider Notes (Signed)
 [place] EMERGENCY DEPARTMENT AT [facility] Provider Note  CSN: [id number] Arrival date & time: 05/19/24 1349  Chief Complaint(s) Leg Pain  HPI Bradley [name] is a 78 y.o. male history of CKD, diabetes, atrial fibrillation on Eliquis , prior stroke, peripheral artery disease status post right femoral-popliteal bypass graft with chronic occlusion presenting with right leg pain.  Patient reports this morning episode of right leg pain which is severe, sudden, developed associated nausea vomiting.  Pain is improved.  Reports some chronic coldness to the right foot and tingling which seems somewhat normal.  No pain in the chest, abdomen.  No fevers or chills.  No rash.  No other new symptoms.   Past Medical History Past Medical History:  Diagnosis Date   Acquired equinus deformity of foot 12/15/2011   Anemia    Arthritis    Carotid artery occlusion    a. Carotid US  10/16: RICA 60-79%, L CEA patent with 1-39% stenosis   CKD (chronic kidney disease)    stage 3-4   Coagulation disorder 05/25/2019   Coronary artery disease    a. Myoview  9/16:  EF 52%, inferior fixed defect consistent with diaphragmatic attenuation, intermediate risk secondary to poor exercise tolerance and symptoms during stress;  b. LHC 05/17/2015 90% mid LAD, 80% ost D2, 75% mid RCA, 35% prox RCA. >> S/p CABG   Coronary artery disease involving coronary bypass graft of native heart with angina pectoris    CORONARY ARTERY BYPASS GRAFTING x 3 - 05/23/2015 Left internal mammary artery to left anterior descending  Saphenous vein graft to diagonal  Saphenous vein graft to posterior descending Endoscopic greater saphenous vein harvest right thigh     Developmental delay    Diabetes mellitus age 19   Diverticulitis    Diverticulosis of colon without hemorrhage 05/17/2015   By CT scan    DM type 2, controlled, with complication ([name]) 04/29/2015   Essential hypertension 04/29/2015   GERD (gastroesophageal reflux  disease)    H/O hiatal hernia    History of echocardiogram    a. Echo 9/16: GLS -15.2%, EF 55-60%, grade 1 diastolic dysfunction, normal wall motion, aortic sclerosis, dilated aortic root 39 mm, atrial septal lipomatous hypertrophy   Hyperlipidemia    Hypertension    Irritable bowel syndrome (IBS)    Myocardial infarction ([name])    silent inferior MI; patient denies MI history (03/17/13)    Neuralgia    Neuropathy 2013   OSA on CPAP 05/30/2015   Osteoporosis 2013   PAF (paroxysmal atrial fibrillation) ([name])    post CABG 2016; Amiodarone  stopped 2/2 wheezing; PAF 01/12/20   Peripheral vascular disease    Plantar fasciitis 12/15/2011   Pneumonia    Reflux esophagitis    Right bundle branch block 04/30/2015   Sleep apnea    uses CPAP   Stenosis of right internal carotid artery with cerebral infarction ([name]) 12/09/2011   Stress fracture of metatarsal bone 02/18/2012   Stroke ([name]) 1987   Right brain stroke- slight drop on left side   Syncope 01/12/2020   Type 2 diabetes mellitus with vascular disease ([name]) 05/30/2015   Wears glasses    Patient Active Problem List   Diagnosis Date Noted   Cervical myelopathy ([name]) 08/25/2023   Diabetic neuropathy ([name]) 06/03/2022   PAD (peripheral artery disease) 11/30/2020   Carotid stenosis 06/01/2020   Carotid stenosis, asymptomatic 06/01/2020   Syncope 01/12/2020   Coagulation disorder 05/25/2019   Coronary artery disease involving coronary bypass graft of  native heart with angina pectoris    PAF (paroxysmal atrial fibrillation) ([name])    Type 2 diabetes mellitus with vascular disease ([name]) 05/30/2015   OSA on CPAP 05/30/2015   Abnormal stress test 05/18/2015   Diverticulosis of colon without hemorrhage 05/17/2015   Right bundle branch block 04/30/2015   Essential hypertension 04/29/2015   Hyperlipidemia 04/29/2015   DM type 2, controlled, with complication ([name]) 04/29/2015   Peripheral vascular disease 06/08/2012   Stress fracture of  metatarsal bone 02/18/2012   Acquired equinus deformity of foot 12/15/2011   Plantar fasciitis 12/15/2011   Stenosis of right internal carotid artery with cerebral infarction ([name]) 12/09/2011   Home Medication(s) Prior to Admission medications   Medication Sig Start Date End Date Taking? Authorizing Provider  amLODipine  (NORVASC ) 10 MG tablet Take 1 tablet (10 mg total) by mouth daily. 03/26/20  Yes Claudene Victory ORN, MD  apixaban  (ELIQUIS ) 5 MG TABS tablet Take 1 tablet (5 mg total) by mouth 2 (two) times daily. 08/13/21  Yes Claudene Victory ORN, MD  hydrALAZINE  (APRESOLINE ) 25 MG tablet Take 1 tablet (25 mg total) by mouth in the morning and at bedtime. 10/08/21  Yes Claudene Victory ORN, MD  aspirin  EC 81 MG tablet Take 81 mg by mouth daily. Swallow whole.    [provider]  atorvastatin  (LIPITOR) 40 MG tablet Take 40 mg by mouth daily.    [provider]  Cholecalciferol  (VITAMIN D3) 125 MCG (5000 UT) TABS Take 5,000 Units by mouth daily.     [provider]  dexlansoprazole (DEXILANT) 60 MG capsule Take 60 mg by mouth daily.     [provider]  Dulaglutide  (TRULICITY ) 3 MG/0.5ML SOPN Inject 3 mg into the skin every Sunday. 05/14/23   [provider]  famotidine  (PEPCID ) 20 MG tablet Take 20 mg by mouth at bedtime. 01/22/22   [provider]  gabapentin  (NEURONTIN ) 300 MG capsule Take 600 mg by mouth at bedtime.  02/20/19   [provider]  HUMALOG  KWIKPEN 100 UNIT/ML KwikPen Inject 25-30 Units into the skin 3 (three) times daily. 05/15/23   [provider]  hydrocortisone  (ANUSOL -HC) 2.5 % rectal cream Place 1 application rectally daily as needed for hemorrhoids.     [provider]  ipratropium (ATROVENT) 0.03 % nasal spray Place 2 sprays into both nostrils 2 (two) times daily. 03/21/24   [provider]  ketoconazole  (NIZORAL ) 2 % shampoo Apply 1 application topically daily as needed for irritation.  02/19/19   [provider]  Melatonin 5 MG CHEW Chew 10 mg by mouth at bedtime. 10/18/20   [provider]  methocarbamol  (ROBAXIN ) 500 MG tablet Take 500 mg by mouth every 6 (six) hours as needed for muscle spasms.  12/08/19   [provider]  metoprolol  tartrate (LOPRESSOR ) 50 MG tablet TAKE 1 AND 1/2 TABLETS BY MOUTH TWICE DAILY 01/17/22   Claudene Victory ORN, MD  nitroGLYCERIN  (NITROSTAT ) 0.3 MG SL tablet Place 1 tablet (0.3 mg total) under the tongue every 5 (five) minutes as needed for chest pain. Only as needed 10/08/21   Claudene Victory ORN, MD  testosterone  cypionate (DEPOTESTOSTERONE CYPIONATE) 200 MG/ML injection Inject 200 mg into the muscle once a week. 03/17/19   [provider]  torsemide  (DEMADEX ) 20 MG tablet Take 1 tablet (20 mg total) by mouth daily. 10/08/21   Claudene Victory ORN, MD  traMADol  (ULTRAM ) 50 MG tablet Take 1-2 tablets (50-100 mg total) by mouth every 6 (six)  hours as needed for moderate pain (pain score 4-6) or severe pain (pain score 7-10). 08/26/23   Johnanna Credit Caylin, PA-C  TRESIBA  FLEXTOUCH 100 UNIT/ML SOPN FlexTouch Pen Inject 25 Units into the skin daily. 03/30/19   [provider]                                                                                                                                    Past Surgical History Past Surgical History:  Procedure Laterality Date   ABDOMINAL AORTOGRAM W/LOWER EXTREMITY Bilateral 11/27/2020   Procedure: ABDOMINAL AORTOGRAM W/LOWER EXTREMITY;  Surgeon: Serene Gaile ORN, MD;  Location: [facility] INVASIVE CV LAB;  Service: Cardiovascular;  Laterality: Bilateral;   ANTERIOR CERVICAL DECOMP/DISCECTOMY FUSION N/A 08/25/2023   Procedure: CERVICAL THREE-FOUR, CERVICAL FOUR-FIVE, CERVICAL FIVE-SIX ANTERIOR CERVICAL DECOMPRESSION/DISCECTOMY FUSION;  Surgeon: Debby Dorn MATSU, MD;  Location: [facility] OR;  Service: Neurosurgery;  Laterality: N/A;   CARDIAC CATHETERIZATION N/A 05/17/2015   Procedure: Left Heart Cath and Coronary  Angiography;  Surgeon: Victory ORN Sharps, MD;  Location: [facility] INVASIVE CV LAB;  Service: Cardiovascular;  Laterality: N/A;   CAROTID ENDARTERECTOMY  1994 & redo 2001   Left   CATARACT EXTRACTION W/ INTRAOCULAR LENS IMPLANT Bilateral    COLONOSCOPY W/ BIOPSIES AND POLYPECTOMY     CORONARY ARTERY BYPASS GRAFT N/A 05/23/2015   Procedure: CORONARY ARTERY BYPASS GRAFTING (CABG) x 3 (LIMA to LAD, SVG to DIAGONAL 2, SVG to PDA) with Endoscopic Vein Harvesting from right greater saphenous vein;  Surgeon: Dallas KATHEE Jude, MD;  Location: [facility] OR;  Service: Open Heart Surgery;  Laterality: N/A;   ENDARTERECTOMY FEMORAL Right 11/30/2020   Procedure: ENDARTERECTOMY FEMORAL RIGHT;  Surgeon: Oris Krystal FALCON, MD;  Location: [facility] OR;  Service: Vascular;  Laterality: Right;   FEMORAL-POPLITEAL BYPASS GRAFT Right 11/30/2020   Procedure: BYPASS GRAFT FEMORAL-POPLITEAL ARTERY RIGHT;  Surgeon: Oris Krystal FALCON, MD;  Location: [facility] OR;  Service: Vascular;  Laterality: Right;   FRACTURE SURGERY  2013   Right   foo  t X's 2   ILIAC ARTERY STENT     LAPAROSCOPIC CHOLECYSTECTOMY  09/11/2016   POSTERIOR LUMBAR FUSION  03/24/2013   Level 1   PR VEIN BYPASS GRAFT,AORTO-FEM-POP  1998   SPINE SURGERY  2014   TEE WITHOUT CARDIOVERSION N/A 05/23/2015   Procedure: TRANSESOPHAGEAL ECHOCARDIOGRAM (TEE);  Surgeon: Dallas KATHEE Jude, MD;  Location: [facility] OR;  Service: Open Heart Surgery;  Laterality: N/A;   TONSILLECTOMY     TRANSCAROTID ARTERY REVASCULARIZATION  Right 06/01/2020   Procedure: RIGHT TRANSCAROTID ARTERY REVASCULARIZATION;  Surgeon: Serene Gaile ORN, MD;  Location: [facility] OR;  Service: Vascular;  Laterality: Right;   TRIGGER FINGER RELEASE Right 07/14/2017   Procedure: RIGHT LONG FINGER TRIGGER RELEASE;  Surgeon: Sebastian Lenis, MD;  Location: [facility];  Service: Orthopedics;  Laterality: Right;   Family History Family History  Problem Relation Age of Onset  Cancer Mother 52       pancreatic   Hyperlipidemia  Mother    Stroke Father 91   Deep vein thrombosis Father    Hyperlipidemia Father    Hypertension Father     Social History Social History   Tobacco Use   Smoking status: Former    Current packs/day: 0.00    Average packs/day: 2.0 packs/day for 50.0 years (100.0 ttl pk-yrs)    Types: Cigarettes    Start date: 03/27/1959    Quit date: 03/26/2009    Years since quitting: 15.1   Smokeless tobacco: Never  Vaping Use   Vaping status: Never Used  Substance Use Topics   Alcohol use: No   Drug use: No   Allergies Doxazosin mesylate, Hydrocodone, Niacin, Oxycodone , Penicillins, and Sulfa drugs cross reactors  Review of Systems Review of Systems  All other systems reviewed and are negative.   Physical Exam Vital Signs  I have reviewed the triage vital signs BP (!) 137/95 (BP Location: Right Arm)   Pulse 75   Temp 97.6 F (36.4 C) (Oral)   Resp 14   Ht 5' 11 (1.803 m)   Wt 95.7 kg   SpO2 99%   BMI 29.43 kg/m  Physical Exam Vitals and nursing note reviewed.  Constitutional:      General: He is not in acute distress.    Appearance: Normal appearance.  HENT:     Mouth/Throat:     Mouth: Mucous membranes are moist.  Eyes:     Conjunctiva/sclera: Conjunctivae normal.  Cardiovascular:     Rate and Rhythm: Normal rate and regular rhythm.  Pulmonary:     Effort: Pulmonary effort is normal. No respiratory distress.     Breath sounds: Normal breath sounds.  Abdominal:     General: Abdomen is flat.     Palpations: Abdomen is soft.     Tenderness: There is no abdominal tenderness.  Musculoskeletal:     Right lower leg: No edema.     Left lower leg: No edema.     Comments: Right lower extremity cool to the touch compared to the left.  Diminished right lower extremity capillary refill, greater than 3 seconds.  No palpable pulses right lower extremity  Skin:    General: Skin is warm and dry.     Capillary Refill: Capillary refill takes less than 2 seconds.  Neurological:      Mental Status: He is alert and oriented to person, place, and time. Mental status is at baseline.  Psychiatric:        Mood and Affect: Mood normal.        Behavior: Behavior normal.     ED Results and Treatments Labs (all labs ordered are listed, but only abnormal results are displayed) Labs Reviewed  CBC WITH DIFFERENTIAL/PLATELET - Abnormal; Notable for the following components:      Result Value   Hemoglobin 11.0 (*)    HCT 37.4 (*)    MCH 25.9 (*)    MCHC 29.4 (*)    RDW 18.0 (*)    Neutro Abs 8.0 (*)    All other components within normal limits  BASIC METABOLIC PANEL WITH GFR - Abnormal; Notable for the following components:   Glucose, Bld 424 (*)    BUN 45 (*)    Creatinine, Ser 3.24 (*)    Calcium  8.6 (*)    GFR, Estimated 19 (*)    All other components within normal limits  CBG MONITORING, ED -  Abnormal; Notable for the following components:   Glucose-Capillary 344 (*)    All other components within normal limits  APTT  HEPARIN  LEVEL (UNFRACTIONATED)                                                                                                                          Radiology VAS US  LOWER EXTREMITY BYPASS GRAFT DUPL Result Date: 05/19/2024 LOWER EXTREMITY ARTERIAL DUPLEX STUDY Patient Name:  Bradley [name] [facility]  Date of Exam:   05/19/2024 Medical Rec #: [id number]        Accession #:    [id number] Date of Birth: 1946/04/23        Patient Gender: M Patient Age:   6 years Exam Location:  [facility] Procedure:      VAS US  LOWER EXTREMITY BYPASS GRAFT DUPLEX Referring Phys: AMJAD ALI --------------------------------------------------------------------------------  Indications: Severe pain that began at noon today. Tech noted right foot              considerably colder than left foot. High Risk Factors: Hypertension, hyperlipidemia, past history of smoking.  Vascular Interventions: Aorta bi-femoral bypass graft 1998.                         Vascular Interventions:  Right TCAR 06/01/2020. Right                         femoral to above                         knee bypass graft 11/30/2020. Right femoral                         endarterectomy. Current ABI:            N/A Comparison Study: Prior ABI done 10/29/23 indicated Right:ABI 0.65 TBI 0.33 Left:                   ABI 0.71 TBI 0.60 Prior LEA done 10/15/21 indicated patent                   fem-pop bypass graft Performing Technologist: Alberta Lis RVS  Examination Guidelines: A complete evaluation includes B-mode imaging, spectral Doppler, color Doppler, and power Doppler as needed of all accessible portions of each vessel. Bilateral testing is considered an integral part of a complete examination. Limited examinations for reoccurring indications may be performed as noted.  +-----------+--------+-----+--------+-----------------+--------+ RIGHT      PSV cm/sRatioStenosisWaveform         Comments +-----------+--------+-----+--------+-----------------+--------+ POP Prox                        severely dampened         +-----------+--------+-----+--------+-----------------+--------+ POP Distal  severely dampened         +-----------+--------+-----+--------+-----------------+--------+ ATA Prox                occluded                          +-----------+--------+-----+--------+-----------------+--------+ ATA Mid                 occluded                          +-----------+--------+-----+--------+-----------------+--------+ ATA Distal              occluded                          +-----------+--------+-----+--------+-----------------+--------+ PTA Prox                occluded                          +-----------+--------+-----+--------+-----------------+--------+ PTA Mid                 occluded                          +-----------+--------+-----+--------+-----------------+--------+ PTA Distal              occluded                           +-----------+--------+-----+--------+-----------------+--------+ PERO Prox               occluded                          +-----------+--------+-----+--------+-----------------+--------+ PERO Mid                occluded                          +-----------+--------+-----+--------+-----------------+--------+ PERO Distal             occluded                          +-----------+--------+-----+--------+-----------------+--------+  Right Graft #1: Fem-Pop +------------------+--------+--------+--------+--------+                   PSV cm/sStenosisWaveformComments +------------------+--------+--------+--------+--------+ Inflow                                             +------------------+--------+--------+--------+--------+ Prox Anastomosis          occluded                 +------------------+--------+--------+--------+--------+ Proximal Graft            occluded                 +------------------+--------+--------+--------+--------+ Mid Graft                 occluded                 +------------------+--------+--------+--------+--------+ Distal Graft              occluded                 +------------------+--------+--------+--------+--------+  Distal Anastomosis        occluded                 +------------------+--------+--------+--------+--------+ Outflow                   occluded                 +------------------+--------+--------+--------+--------+   Summary: Right: The right femoral-popliteal graft is occluded. No notable 3 vessel run off to foot.  See table(s) above for measurements and observations. Suggest Peripheral Vascular Consult.    Preliminary    VAS US  LOWER EXTREMITY VENOUS (DVT) (7a-7p) Result Date: 05/19/2024  Lower Venous DVT Study Patient Name:  Bradley [name] [facility]  Date of Exam:   05/19/2024 Medical Rec #: [id number]        Accession #:    [id number] Date of Birth: 16-Nov-1945        Patient Gender: M Patient Age:   86 years  Exam Location:  [facility] Procedure:      VAS US  LOWER EXTREMITY VENOUS (DVT) Referring Phys: AMJAD ALI --------------------------------------------------------------------------------  Indications: Proximal calf pain that began around noon today.  Comparison Study: No prior LEV on file Performing Technologist: Alberta Lis RVS  Examination Guidelines: A complete evaluation includes B-mode imaging, spectral Doppler, color Doppler, and power Doppler as needed of all accessible portions of each vessel. Bilateral testing is considered an integral part of a complete examination. Limited examinations for reoccurring indications may be performed as noted. The reflux portion of the exam is performed with the patient in reverse Trendelenburg.  +---------+---------------+---------+-----------+----------+--------------+ RIGHT    CompressibilityPhasicitySpontaneityPropertiesThrombus Aging +---------+---------------+---------+-----------+----------+--------------+ CFV      Full           Yes      Yes                                 +---------+---------------+---------+-----------+----------+--------------+ SFJ      Full                                                        +---------+---------------+---------+-----------+----------+--------------+ FV Prox  Full           Yes      Yes                                 +---------+---------------+---------+-----------+----------+--------------+ FV Mid   Full                                                        +---------+---------------+---------+-----------+----------+--------------+ FV DistalFull           Yes      Yes                                 +---------+---------------+---------+-----------+----------+--------------+ PFV      Full                                                        +---------+---------------+---------+-----------+----------+--------------+  POP      Full           Yes      Yes                                  +---------+---------------+---------+-----------+----------+--------------+ PTV      Full                                                        +---------+---------------+---------+-----------+----------+--------------+ PERO     Full                                                        +---------+---------------+---------+-----------+----------+--------------+ Gastroc  Full                                                        +---------+---------------+---------+-----------+----------+--------------+   +----+---------------+---------+-----------+----------+--------------+ LEFTCompressibilityPhasicitySpontaneityPropertiesThrombus Aging +----+---------------+---------+-----------+----------+--------------+ CFV Full           Yes      Yes                                 +----+---------------+---------+-----------+----------+--------------+ SFJ Full                                                        +----+---------------+---------+-----------+----------+--------------+    Summary: RIGHT: - There is no evidence of deep vein thrombosis in the lower extremity.  - No cystic structure found in the popliteal fossa.  LEFT: - No evidence of common femoral vein obstruction.   *See table(s) above for measurements and observations. Electronically signed by Norman Serve on 05/19/2024 at 5:18:13 PM.    Final     Pertinent labs & imaging results that were available during my care of the patient were reviewed by me and considered in my medical decision making (see MDM for details).  Medications Ordered in ED Medications  lactated ringers  bolus 1,000 mL (has no administration in time range)  heparin  ADULT infusion 100 units/mL (25000 units/250mL) (has no administration in time range)  heparin  bolus via infusion 3,000 Units (has no administration in time range)  Procedures .Critical Care  Performed by: Francesca Elsie CROME, MD Authorized by: Francesca Elsie CROME, MD   Critical care provider statement:    Critical care time (minutes):  30   Critical care was necessary to treat or prevent imminent or life-threatening deterioration of the following conditions:  Circulatory failure   Critical care was time spent personally by me on the following activities:  Development of treatment plan with patient or surrogate, discussions with consultants, evaluation of patient's response to treatment, examination of patient, ordering and review of laboratory studies, ordering and review of radiographic studies, ordering and performing treatments and interventions, pulse oximetry, re-evaluation of patient's condition and review of old charts   Care discussed with: admitting provider     (including critical care time)  Medical Decision Making / ED Course   MDM:  78 year old presenting to the emergency department with right leg pain.  Pain is improved, clinically has findings of limb ischemia although reviewing prior vascular surgery notes it seems somewhat chronic.  Ultrasound was obtained which did show absence of three-vessel runoff in the lower extremity suggestive of possible graft occlusion as cause of his pain.  DVT ultrasound was without evidence of DVT.  No skin changes to raise concern for infection.  Discussed with Dr. Pearline with vascular surgery, recommends patient be admitted, initiated on heparin , plan for possible angiogram tomorrow to further evaluate.  Patient is agreeable for this.  Discussed with Dr. Silvester with hospitalist who will admit patient.      Additional history obtained: -Additional history obtained from family and ems -External records from outside source obtained and reviewed including: Chart review including previous notes, labs, imaging, consultation notes including prior notes    Lab  Tests: -I ordered, reviewed, and interpreted labs.   The pertinent results include:   Labs Reviewed  CBC WITH DIFFERENTIAL/PLATELET - Abnormal; Notable for the following components:      Result Value   Hemoglobin 11.0 (*)    HCT 37.4 (*)    MCH 25.9 (*)    MCHC 29.4 (*)    RDW 18.0 (*)    Neutro Abs 8.0 (*)    All other components within normal limits  BASIC METABOLIC PANEL WITH GFR - Abnormal; Notable for the following components:   Glucose, Bld 424 (*)    BUN 45 (*)    Creatinine, Ser 3.24 (*)    Calcium  8.6 (*)    GFR, Estimated 19 (*)    All other components within normal limits  CBG MONITORING, ED - Abnormal; Notable for the following components:   Glucose-Capillary 344 (*)    All other components within normal limits  APTT  HEPARIN  LEVEL (UNFRACTIONATED)    Notable for CKD, hyperglycemia   Imaging Studies ordered: I ordered imaging studies including US  vascular  On my interpretation imaging demonstrates lack of arterial signals  I independently visualized and interpreted imaging. I agree with the radiologist interpretation   Medicines ordered and prescription drug management: Meds ordered this encounter  Medications   DISCONTD: lactated ringers  bolus 500 mL   DISCONTD: sodium chloride  0.9 % bolus 1,000 mL   DISCONTD: lactated ringers  bolus 1,500 mL   lactated ringers  bolus 1,000 mL   heparin  ADULT infusion 100 units/mL (25000 units/250mL)   heparin  bolus via infusion 3,000 Units    -I have reviewed the patients home medicines and have made adjustments as needed   Consultations Obtained: I requested consultation with the vascular surgeon,  and discussed lab and imaging findings  as well as pertinent plan - they recommend: admission   Co morbidities that complicate the patient evaluation  Past Medical History:  Diagnosis Date   Acquired equinus deformity of foot 12/15/2011   Anemia    Arthritis    Carotid artery occlusion    a. Carotid US  10/16: RICA  60-79%, L CEA patent with 1-39% stenosis   CKD (chronic kidney disease)    stage 3-4   Coagulation disorder 05/25/2019   Coronary artery disease    a. Myoview  9/16:  EF 52%, inferior fixed defect consistent with diaphragmatic attenuation, intermediate risk secondary to poor exercise tolerance and symptoms during stress;  b. LHC 05/17/2015 90% mid LAD, 80% ost D2, 75% mid RCA, 35% prox RCA. >> S/p CABG   Coronary artery disease involving coronary bypass graft of native heart with angina pectoris    CORONARY ARTERY BYPASS GRAFTING x 3 - 05/23/2015 Left internal mammary artery to left anterior descending  Saphenous vein graft to diagonal  Saphenous vein graft to posterior descending Endoscopic greater saphenous vein harvest right thigh     Developmental delay    Diabetes mellitus age 9   Diverticulitis    Diverticulosis of colon without hemorrhage 05/17/2015   By CT scan    DM type 2, controlled, with complication ([name]) 04/29/2015   Essential hypertension 04/29/2015   GERD (gastroesophageal reflux disease)    H/O hiatal hernia    History of echocardiogram    a. Echo 9/16: GLS -15.2%, EF 55-60%, grade 1 diastolic dysfunction, normal wall motion, aortic sclerosis, dilated aortic root 39 mm, atrial septal lipomatous hypertrophy   Hyperlipidemia    Hypertension    Irritable bowel syndrome (IBS)    Myocardial infarction ([name])    silent inferior MI; patient denies MI history (03/17/13)    Neuralgia    Neuropathy 2013   OSA on CPAP 05/30/2015   Osteoporosis 2013   PAF (paroxysmal atrial fibrillation) ([name])    post CABG 2016; Amiodarone  stopped 2/2 wheezing; PAF 01/12/20   Peripheral vascular disease    Plantar fasciitis 12/15/2011   Pneumonia    Reflux esophagitis    Right bundle branch block 04/30/2015   Sleep apnea    uses CPAP   Stenosis of right internal carotid artery with cerebral infarction ([name]) 12/09/2011   Stress fracture of metatarsal bone 02/18/2012   Stroke ([name]) 1987   Right  brain stroke- slight drop on left side   Syncope 01/12/2020   Type 2 diabetes mellitus with vascular disease ([name]) 05/30/2015   Wears glasses       Dispostion: Disposition decision including need for hospitalization was considered, and patient admitted to the hospital.    Final Clinical Impression(s) / ED Diagnoses Final diagnoses:  Critical limb ischemia of right lower extremity with bypass graft [facility])     This chart was dictated using voice recognition software.  Despite best efforts to proofread,  errors can occur which can change the documentation meaning.    Francesca Elsie CROME, MD 05/19/24 [phone]

## 2024-05-20 ENCOUNTER — Observation Stay (HOSPITAL_COMMUNITY)

## 2024-05-20 ENCOUNTER — Encounter (HOSPITAL_COMMUNITY)
Admission: EM | Disposition: A | Discharge status: Home / Self Care | Payer: Self-pay | Source: Home / Self Care | Attending: Internal Medicine

## 2024-05-20 ENCOUNTER — Ambulatory Visit (HOSPITAL_BASED_OUTPATIENT_CLINIC_OR_DEPARTMENT_OTHER): Admitting: Physical Therapy

## 2024-05-20 ENCOUNTER — Encounter (HOSPITAL_COMMUNITY): Payer: Self-pay | Admitting: Internal Medicine

## 2024-05-20 DIAGNOSIS — Z9889 Other specified postprocedural states: Secondary | ICD-10-CM | POA: Diagnosis not present

## 2024-05-20 DIAGNOSIS — Z8249 Family history of ischemic heart disease and other diseases of the circulatory system: Secondary | ICD-10-CM | POA: Diagnosis not present

## 2024-05-20 DIAGNOSIS — G4733 Obstructive sleep apnea (adult) (pediatric): Secondary | ICD-10-CM | POA: Diagnosis not present

## 2024-05-20 DIAGNOSIS — I5032 Chronic diastolic (congestive) heart failure: Secondary | ICD-10-CM | POA: Diagnosis not present

## 2024-05-20 DIAGNOSIS — E1140 Type 2 diabetes mellitus with diabetic neuropathy, unspecified: Secondary | ICD-10-CM | POA: Diagnosis not present

## 2024-05-20 DIAGNOSIS — M810 Age-related osteoporosis without current pathological fracture: Secondary | ICD-10-CM | POA: Diagnosis not present

## 2024-05-20 DIAGNOSIS — I25810 Atherosclerosis of coronary artery bypass graft(s) without angina pectoris: Secondary | ICD-10-CM | POA: Diagnosis not present

## 2024-05-20 DIAGNOSIS — I998 Other disorder of circulatory system: Secondary | ICD-10-CM | POA: Diagnosis not present

## 2024-05-20 DIAGNOSIS — Z79899 Other long term (current) drug therapy: Secondary | ICD-10-CM | POA: Diagnosis not present

## 2024-05-20 DIAGNOSIS — E1122 Type 2 diabetes mellitus with diabetic chronic kidney disease: Secondary | ICD-10-CM | POA: Diagnosis not present

## 2024-05-20 DIAGNOSIS — I2510 Atherosclerotic heart disease of native coronary artery without angina pectoris: Secondary | ICD-10-CM | POA: Diagnosis not present

## 2024-05-20 DIAGNOSIS — Z794 Long term (current) use of insulin: Secondary | ICD-10-CM | POA: Diagnosis not present

## 2024-05-20 DIAGNOSIS — I480 Paroxysmal atrial fibrillation: Secondary | ICD-10-CM | POA: Diagnosis not present

## 2024-05-20 DIAGNOSIS — I130 Hypertensive heart and chronic kidney disease with heart failure and stage 1 through stage 4 chronic kidney disease, or unspecified chronic kidney disease: Secondary | ICD-10-CM | POA: Diagnosis not present

## 2024-05-20 DIAGNOSIS — Z7901 Long term (current) use of anticoagulants: Secondary | ICD-10-CM | POA: Diagnosis not present

## 2024-05-20 DIAGNOSIS — Z880 Allergy status to penicillin: Secondary | ICD-10-CM | POA: Diagnosis not present

## 2024-05-20 DIAGNOSIS — E1151 Type 2 diabetes mellitus with diabetic peripheral angiopathy without gangrene: Secondary | ICD-10-CM | POA: Diagnosis not present

## 2024-05-20 DIAGNOSIS — N184 Chronic kidney disease, stage 4 (severe): Secondary | ICD-10-CM | POA: Diagnosis present

## 2024-05-20 DIAGNOSIS — Z823 Family history of stroke: Secondary | ICD-10-CM | POA: Diagnosis not present

## 2024-05-20 DIAGNOSIS — N189 Chronic kidney disease, unspecified: Secondary | ICD-10-CM | POA: Diagnosis not present

## 2024-05-20 DIAGNOSIS — I70221 Atherosclerosis of native arteries of extremities with rest pain, right leg: Secondary | ICD-10-CM | POA: Diagnosis not present

## 2024-05-20 DIAGNOSIS — I70321 Atherosclerosis of unspecified type of bypass graft(s) of the extremities with rest pain, right leg: Secondary | ICD-10-CM | POA: Diagnosis not present

## 2024-05-20 DIAGNOSIS — Z87891 Personal history of nicotine dependence: Secondary | ICD-10-CM | POA: Diagnosis not present

## 2024-05-20 DIAGNOSIS — Z7982 Long term (current) use of aspirin: Secondary | ICD-10-CM | POA: Diagnosis not present

## 2024-05-20 DIAGNOSIS — Z8673 Personal history of transient ischemic attack (TIA), and cerebral infarction without residual deficits: Secondary | ICD-10-CM | POA: Diagnosis not present

## 2024-05-20 DIAGNOSIS — M79661 Pain in right lower leg: Secondary | ICD-10-CM | POA: Diagnosis not present

## 2024-05-20 DIAGNOSIS — E785 Hyperlipidemia, unspecified: Secondary | ICD-10-CM | POA: Diagnosis not present

## 2024-05-20 DIAGNOSIS — I252 Old myocardial infarction: Secondary | ICD-10-CM | POA: Diagnosis not present

## 2024-05-20 DIAGNOSIS — Z83438 Family history of other disorder of lipoprotein metabolism and other lipidemia: Secondary | ICD-10-CM | POA: Diagnosis not present

## 2024-05-20 HISTORY — PX: LOWER EXTREMITY ANGIOGRAPHY: CATH118251

## 2024-05-20 LAB — CBG MONITORING, ED
Glucose-Capillary: 220 mg/dL — ABNORMAL HIGH (ref 70–99)
Glucose-Capillary: 312 mg/dL — ABNORMAL HIGH (ref 70–99)

## 2024-05-20 LAB — MAGNESIUM: Magnesium: 1.9 mg/dL (ref 1.7–2.4)

## 2024-05-20 LAB — CBC
HCT: 33.5 % — ABNORMAL LOW (ref 39.0–52.0)
Hemoglobin: 10.1 g/dL — ABNORMAL LOW (ref 13.0–17.0)
MCH: 26 pg (ref 26.0–34.0)
MCHC: 30.1 g/dL (ref 30.0–36.0)
MCV: 86.3 fL (ref 80.0–100.0)
Platelets: 213 K/uL (ref 150–400)
RBC: 3.88 MIL/uL — ABNORMAL LOW (ref 4.22–5.81)
RDW: 17.9 % — ABNORMAL HIGH (ref 11.5–15.5)
WBC: 6.7 K/uL (ref 4.0–10.5)
nRBC: 0 % (ref 0.0–0.2)

## 2024-05-20 LAB — COMPREHENSIVE METABOLIC PANEL WITH GFR
ALT: 12 U/L (ref 0–44)
AST: <10 U/L — ABNORMAL LOW (ref 15–41)
Albumin: 3.3 g/dL — ABNORMAL LOW (ref 3.5–5.0)
Alkaline Phosphatase: 58 U/L (ref 38–126)
Anion gap: 16 — ABNORMAL HIGH (ref 5–15)
BUN: 36 mg/dL — ABNORMAL HIGH (ref 8–23)
CO2: 24 mmol/L (ref 22–32)
Calcium: 8.6 mg/dL — ABNORMAL LOW (ref 8.9–10.3)
Chloride: 101 mmol/L (ref 98–111)
Creatinine, Ser: 2.84 mg/dL — ABNORMAL HIGH (ref 0.61–1.24)
GFR, Estimated: 22 mL/min — ABNORMAL LOW (ref >60)
Glucose, Bld: 212 mg/dL — ABNORMAL HIGH (ref 70–99)
Potassium: 3.1 mmol/L — ABNORMAL LOW (ref 3.5–5.1)
Sodium: 141 mmol/L (ref 135–145)
Total Bilirubin: 0.9 mg/dL (ref 0.0–1.2)
Total Protein: 6.1 g/dL — ABNORMAL LOW (ref 6.5–8.1)

## 2024-05-20 LAB — VITAMIN B12: Vitamin B-12: 720 pg/mL (ref 180–914)

## 2024-05-20 LAB — HEPARIN LEVEL (UNFRACTIONATED): Heparin Unfractionated: >1.1 [IU]/mL — ABNORMAL HIGH (ref 0.30–0.70)

## 2024-05-20 LAB — GLUCOSE, CAPILLARY
Glucose-Capillary: 178 mg/dL — ABNORMAL HIGH (ref 70–99)
Glucose-Capillary: 247 mg/dL — ABNORMAL HIGH (ref 70–99)

## 2024-05-20 LAB — PHOSPHORUS: Phosphorus: 4 mg/dL (ref 2.5–4.6)

## 2024-05-20 LAB — FOLATE: Folate: 9.6 ng/mL (ref >5.9)

## 2024-05-20 LAB — APTT
aPTT: 111 s — ABNORMAL HIGH (ref 24–36)
aPTT: 28 s (ref 24–36)

## 2024-05-20 MED ORDER — LIDOCAINE HCL (PF) 1 % IJ SOLN
INTRAMUSCULAR | Status: DC | PRN
  Administered 2024-05-20: 15 mL

## 2024-05-20 MED ORDER — AMLODIPINE BESYLATE 10 MG PO TABS
10.0000 mg | ORAL_TABLET | Freq: Every day | ORAL | Status: DC
  Administered 2024-05-20 – 2024-05-21 (×2): 10 mg via ORAL
  Filled 2024-05-20: qty 2
  Filled 2024-05-20: qty 1

## 2024-05-20 MED ORDER — HYDROMORPHONE HCL 1 MG/ML IJ SOLN: 0.5000 mg | INTRAMUSCULAR | Status: DC | PRN

## 2024-05-20 MED ORDER — INSULIN GLARGINE 100 UNIT/ML ~~LOC~~ SOLN
15.0000 [IU] | Freq: Every day | SUBCUTANEOUS | Status: DC
  Administered 2024-05-20 – 2024-05-21 (×2): 15 [IU] via SUBCUTANEOUS
  Filled 2024-05-20 (×2): qty 0.15

## 2024-05-20 MED ORDER — METOPROLOL TARTRATE 50 MG PO TABS
75.0000 mg | ORAL_TABLET | Freq: Two times a day (BID) | ORAL | Status: DC
  Administered 2024-05-20 – 2024-05-21 (×3): 75 mg via ORAL
  Filled 2024-05-20: qty 3
  Filled 2024-05-20 (×2): qty 1

## 2024-05-20 MED ORDER — GABAPENTIN 300 MG PO CAPS
600.0000 mg | ORAL_CAPSULE | Freq: Every day | ORAL | Status: DC
  Administered 2024-05-20: 600 mg via ORAL
  Filled 2024-05-20: qty 2

## 2024-05-20 MED ORDER — ACETAMINOPHEN 650 MG RE SUPP: 650.0000 mg | Freq: Four times a day (QID) | RECTAL | Status: DC | PRN

## 2024-05-20 MED ORDER — LIDOCAINE HCL (PF) 1 % IJ SOLN
INTRAMUSCULAR | Status: AC
  Filled 2024-05-20: qty 30

## 2024-05-20 MED ORDER — OXYCODONE HCL 5 MG PO TABS: 2.5000 mg | ORAL_TABLET | ORAL | Status: DC | PRN

## 2024-05-20 MED ORDER — ACETAMINOPHEN 325 MG PO TABS
650.0000 mg | ORAL_TABLET | Freq: Four times a day (QID) | ORAL | Status: DC | PRN
  Administered 2024-05-20: 650 mg via ORAL
  Filled 2024-05-20: qty 2

## 2024-05-20 MED ORDER — OXYCODONE HCL 5 MG PO TABS
5.0000 mg | ORAL_TABLET | ORAL | Status: DC | PRN
  Administered 2024-05-20 – 2024-05-21 (×2): 5 mg via ORAL
  Filled 2024-05-20 (×2): qty 1

## 2024-05-20 MED ORDER — MIDAZOLAM HCL 2 MG/2ML IJ SOLN
INTRAMUSCULAR | Status: AC
  Filled 2024-05-20: qty 2

## 2024-05-20 MED ORDER — SODIUM CHLORIDE 0.9% FLUSH: 3.0000 mL | INTRAVENOUS | Status: DC | PRN

## 2024-05-20 MED ORDER — ACETAMINOPHEN 500 MG PO TABS: 1000.0000 mg | ORAL_TABLET | Freq: Four times a day (QID) | ORAL | Status: DC | PRN

## 2024-05-20 MED ORDER — INSULIN GLARGINE 100 UNIT/ML ~~LOC~~ SOLN
15.0000 [IU] | Freq: Every day | SUBCUTANEOUS | Status: DC
  Filled 2024-05-20: qty 0.15

## 2024-05-20 MED ORDER — SODIUM CHLORIDE 0.9 % WEIGHT BASED INFUSION
1.0000 mL/kg/h | INTRAVENOUS | Status: DC
  Administered 2024-05-20: 1 mL/kg/h via INTRAVENOUS

## 2024-05-20 MED ORDER — FENTANYL CITRATE (PF) 100 MCG/2ML IJ SOLN
INTRAMUSCULAR | Status: DC | PRN
  Administered 2024-05-20: 50 ug via INTRAVENOUS

## 2024-05-20 MED ORDER — MIDAZOLAM HCL 2 MG/2ML IJ SOLN
INTRAMUSCULAR | Status: DC | PRN
  Administered 2024-05-20: 2 mg via INTRAVENOUS

## 2024-05-20 MED ORDER — ONDANSETRON HCL 4 MG PO TABS: 4.0000 mg | ORAL_TABLET | Freq: Four times a day (QID) | ORAL | Status: DC | PRN

## 2024-05-20 MED ORDER — IODIXANOL 320 MG/ML IV SOLN
INTRAVENOUS | Status: DC | PRN
  Administered 2024-05-20: 24 mL

## 2024-05-20 MED ORDER — SODIUM CHLORIDE 0.9% FLUSH
3.0000 mL | Freq: Two times a day (BID) | INTRAVENOUS | Status: DC
  Administered 2024-05-21: 3 mL via INTRAVENOUS

## 2024-05-20 MED ORDER — LABETALOL HCL 5 MG/ML IV SOLN: 10.0000 mg | INTRAVENOUS | Status: DC | PRN

## 2024-05-20 MED ORDER — MAGNESIUM SULFATE 2 GM/50ML IV SOLN
2.0000 g | Freq: Once | INTRAVENOUS | Status: AC
  Administered 2024-05-20: 2 g via INTRAVENOUS
  Filled 2024-05-20: qty 50

## 2024-05-20 MED ORDER — FENTANYL CITRATE (PF) 50 MCG/ML IJ SOSY: 12.5000 ug | PREFILLED_SYRINGE | INTRAMUSCULAR | Status: DC | PRN

## 2024-05-20 MED ORDER — ONDANSETRON HCL 4 MG/2ML IJ SOLN: 4.0000 mg | Freq: Four times a day (QID) | INTRAMUSCULAR | Status: DC | PRN

## 2024-05-20 MED ORDER — SODIUM CHLORIDE 0.9 % IV SOLN: 250.0000 mL | INTRAVENOUS | Status: DC | PRN

## 2024-05-20 MED ORDER — METHOCARBAMOL 500 MG PO TABS
500.0000 mg | ORAL_TABLET | Freq: Four times a day (QID) | ORAL | Status: DC | PRN
  Administered 2024-05-20: 500 mg via ORAL
  Filled 2024-05-20: qty 1

## 2024-05-20 MED ORDER — HYDRALAZINE HCL 20 MG/ML IJ SOLN: 5.0000 mg | INTRAMUSCULAR | Status: DC | PRN

## 2024-05-20 MED ORDER — FENTANYL CITRATE (PF) 100 MCG/2ML IJ SOLN
INTRAMUSCULAR | Status: AC
  Filled 2024-05-20: qty 2

## 2024-05-20 MED ORDER — ATORVASTATIN CALCIUM 40 MG PO TABS
40.0000 mg | ORAL_TABLET | Freq: Every day | ORAL | Status: DC
  Administered 2024-05-20 – 2024-05-21 (×2): 40 mg via ORAL
  Filled 2024-05-20 (×2): qty 1

## 2024-05-20 NOTE — Assessment & Plan Note (Signed)
 Appreciate vascular surgery consult. Continue heparin  May need angiogram tomorrow be mindful of patient's significant CKD and diminished renal function

## 2024-05-20 NOTE — H&P (View-Only) (Signed)
 Progress Note    05/20/2024 6:59 AM Hospital Day 1  Subjective:  just getting back in bed as he just voided and urinal over half full.  He said he had some pain in his calf overnight but better this morning.    Afebrile HR 60's-80's  Vitals:   05/20/24 0445 05/20/24 0527  BP: 138/72 138/72  Pulse: 71 70  Resp: 11 15  Temp:  97.9 F (36.6 C)  SpO2: 94% 95%    Physical Exam: General:  no distress Lungs:  non labored Extremities:  right foot is cooler than left.  Sensation decreased on right compared to left.  Motor is in tact.   CBC    Component Value Date/Time   WBC 6.7 05/20/2024 0541   RBC 3.88 (L) 05/20/2024 0541   HGB 10.1 (L) 05/20/2024 0541   HGB 13.9 02/01/2020 1219   HCT 33.5 (L) 05/20/2024 0541   HCT 39.6 02/01/2020 1219   PLT 213 05/20/2024 0541   PLT 258 02/01/2020 1219   MCV 86.3 05/20/2024 0541   MCV 98 (H) 02/01/2020 1219   MCH 26.0 05/20/2024 0541   MCHC 30.1 05/20/2024 0541   RDW 17.9 (H) 05/20/2024 0541   RDW 13.1 02/01/2020 1219   LYMPHSABS 0.7 05/19/2024 1428   MONOABS 0.4 05/19/2024 1428   EOSABS 0.1 05/19/2024 1428   BASOSABS 0.0 05/19/2024 1428    BMET    Component Value Date/Time   NA 141 05/20/2024 0541   NA 134 02/01/2020 1219   K 3.1 (L) 05/20/2024 0541   CL 101 05/20/2024 0541   CO2 24 05/20/2024 0541   GLUCOSE 212 (H) 05/20/2024 0541   BUN 36 (H) 05/20/2024 0541   BUN 21 02/01/2020 1219   CREATININE 2.84 (H) 05/20/2024 0541   CREATININE 1.76 (H) 09/19/2015 1111   CALCIUM  8.6 (L) 05/20/2024 0541   GFRNONAA 22 (L) 05/20/2024 0541   GFRAA 41 (L) 02/01/2020 1219    INR    Component Value Date/Time   INR 1.0 11/30/2020 0647     Intake/Output Summary (Last 24 hours) at 05/20/2024 0659 Last data filed at 05/20/2024 0354 Gross per 24 hour  Intake 250 ml  Output --  Net 250 ml     Assessment/Plan:  78 y.o. male who presented to the hospital with pain/cramping in the right leg earlier today.  They live in  [place] and the fire fighters arrived 1st and helped put him in the car and his wife drove him to [facility] bc the EMS would have taken him to [facility] and his doctors are here at [facility].     He has hx of right femoral above-knee popliteal artery bypass on 11/30/2020 with Dr. Oris.  He also underwent an aortobifemoral bypass in 1998.  He underwent left carotid endarterectomy in 1994 and redo in 2001.  Right TCAR was performed 06/01/20.   Hospital Day 1  -pt with some pain/cramp in the right calf overnight but currently improved.  He is motor in tact right foot.  Some decreased sensation right foot.   -may benefit from angiogram, however, he had eliquis  24 hours ago and his creatinine is down but still significantly elevated.  He did have good UOP this am.  Most likely will not do angiogram today, but Dr. Pearline will be by later this morning for more definitive plan.   Continue heparin  gtt.  -creatinine down to 2.84 this am   Lucie Apt, PA-C [facility] [phone] 05/20/2024 6:59 AM  VASCULAR STAFF ADDENDUM: I have independently interviewed and examined the patient. I agree with the above.  Plan for CO2 angiogram of right lower extremity today in the Cath Lab  Norman GORMAN Serve MD [facility] Phone Number: [phone] 05/20/2024 9:06 AM

## 2024-05-20 NOTE — ED Notes (Signed)
 New linens applied to stretcher

## 2024-05-20 NOTE — Interval H&P Note (Signed)
 History and Physical Interval Note:  05/20/2024 12:56 PM  Bradley [name]  has presented today for surgery, with the diagnosis of pvd.  The various methods of treatment have been discussed with the patient and family. After consideration of risks, benefits and other options for treatment, the patient has consented to  Procedure(s): Lower Extremity Angiography (N/A) as a surgical intervention.  The patient's history has been reviewed, patient examined, no change in status, stable for surgery.  I have reviewed the patient's chart and labs.  Questions were answered to the patient's satisfaction.     [name]

## 2024-05-20 NOTE — Assessment & Plan Note (Signed)
 Resume CPAP per home with parameters

## 2024-05-20 NOTE — Progress Notes (Signed)
 Pt received from cath lab, right groin soft and unremarkable.  CHG performed, tele applied and CCMD notified.  Plan of care reviewed.  Will cont to monitor

## 2024-05-20 NOTE — Assessment & Plan Note (Signed)
 Continue Lipitor at 20 mg a day

## 2024-05-20 NOTE — Progress Notes (Addendum)
 Progress Note    05/20/2024 6:59 AM Hospital Day 1  Subjective:  just getting back in bed as he just voided and urinal over half full.  He said he had some pain in his calf overnight but better this morning.    Afebrile HR 60's-80's  Vitals:   05/20/24 0445 05/20/24 0527  BP: 138/72 138/72  Pulse: 71 70  Resp: 11 15  Temp:  97.9 F (36.6 C)  SpO2: 94% 95%    Physical Exam: General:  no distress Lungs:  non labored Extremities:  right foot is cooler than left.  Sensation decreased on right compared to left.  Motor is in tact.   CBC    Component Value Date/Time   WBC 6.7 05/20/2024 0541   RBC 3.88 (L) 05/20/2024 0541   HGB 10.1 (L) 05/20/2024 0541   HGB 13.9 02/01/2020 1219   HCT 33.5 (L) 05/20/2024 0541   HCT 39.6 02/01/2020 1219   PLT 213 05/20/2024 0541   PLT 258 02/01/2020 1219   MCV 86.3 05/20/2024 0541   MCV 98 (H) 02/01/2020 1219   MCH 26.0 05/20/2024 0541   MCHC 30.1 05/20/2024 0541   RDW 17.9 (H) 05/20/2024 0541   RDW 13.1 02/01/2020 1219   LYMPHSABS 0.7 05/19/2024 1428   MONOABS 0.4 05/19/2024 1428   EOSABS 0.1 05/19/2024 1428   BASOSABS 0.0 05/19/2024 1428    BMET    Component Value Date/Time   NA 141 05/20/2024 0541   NA 134 02/01/2020 1219   K 3.1 (L) 05/20/2024 0541   CL 101 05/20/2024 0541   CO2 24 05/20/2024 0541   GLUCOSE 212 (H) 05/20/2024 0541   BUN 36 (H) 05/20/2024 0541   BUN 21 02/01/2020 1219   CREATININE 2.84 (H) 05/20/2024 0541   CREATININE 1.76 (H) 09/19/2015 1111   CALCIUM  8.6 (L) 05/20/2024 0541   GFRNONAA 22 (L) 05/20/2024 0541   GFRAA 41 (L) 02/01/2020 1219    INR    Component Value Date/Time   INR 1.0 11/30/2020 0647     Intake/Output Summary (Last 24 hours) at 05/20/2024 0659 Last data filed at 05/20/2024 0354 Gross per 24 hour  Intake 250 ml  Output --  Net 250 ml     Assessment/Plan:  78 y.o. male who presented to the hospital with pain/cramping in the right leg earlier today.  They live in  [place] and the fire fighters arrived 1st and helped put him in the car and his wife drove him to [facility] bc the EMS would have taken him to [facility] and his doctors are here at [facility].     He has hx of right femoral above-knee popliteal artery bypass on 11/30/2020 with Dr. Oris.  He also underwent an aortobifemoral bypass in 1998.  He underwent left carotid endarterectomy in 1994 and redo in 2001.  Right TCAR was performed 06/01/20.   Hospital Day 1  -pt with some pain/cramp in the right calf overnight but currently improved.  He is motor in tact right foot.  Some decreased sensation right foot.   -may benefit from angiogram, however, he had eliquis  24 hours ago and his creatinine is down but still significantly elevated.  He did have good UOP this am.  Most likely will not do angiogram today, but Dr. Pearline will be by later this morning for more definitive plan.   Continue heparin  gtt.  -creatinine down to 2.84 this am   Lucie Apt, PA-C [facility] [phone] 05/20/2024 6:59 AM  VASCULAR STAFF ADDENDUM: I have independently interviewed and examined the patient. I agree with the above.  Plan for CO2 angiogram of right lower extremity today in the Cath Lab  Norman GORMAN Serve MD [facility] Phone Number: [phone] 05/20/2024 9:06 AM

## 2024-05-20 NOTE — Assessment & Plan Note (Addendum)
 Appreciate vascular surgery consult N.p.o. postmidnight for possible angiogram tomorrow continue heparin  Of note patient does has known CKD will need to monitor kidney function need to assess risk and benefit of undergoing angiogram Patient is currently pain-free States he has had coolness in his right lower extremity ever since January

## 2024-05-20 NOTE — Assessment & Plan Note (Signed)
 Monitor blood pressure carefully resume Norvasc  and Toprol 

## 2024-05-20 NOTE — Assessment & Plan Note (Signed)
 Resume Lipitor 40 mg a day and metoprolol  50 mg daily when able to tolerate

## 2024-05-20 NOTE — Assessment & Plan Note (Signed)
-   Order Sensitive SSI   - continue home insulin  but decreased Tresiba  to  15 units,  -  check TSH and HgA1C  - Hold by mouth medications

## 2024-05-20 NOTE — ED Notes (Signed)
 Patient transported to cath lab

## 2024-05-20 NOTE — Op Note (Signed)
    Patient name: Bradley [name] MRN: [id number] DOB: 12/17/1945 Sex: male  05/19/2024 - 05/20/2024 Pre-operative Diagnosis: Right leg pain Post-operative diagnosis:  Same Surgeon:  [name] Procedure Performed:  1.  Ultrasound-guided access, right femoral artery  2.  Abdominal aortogram with CO2  3.  Right leg angiogram  4.  Conscious sedation, 34 minutes  5.  Closure device, Celt   Indications: This is a 78 year old gentleman with significant vascular history who came into the hospital with right leg pain.  He has a history of a right femoral to above-knee popliteal bypass graft that is known to be occluded.  He also has a history of an aortobifemoral bypass.  He has chronic renal sufficiency with a creatinine slightly over 3 at admission but is down to just below that currently  Procedure:  The patient was identified in the holding area and taken to room 8.  The patient was then placed supine on the table and prepped and draped in the usual sterile fashion.  A time out was called.  Conscious sedation was administered with the use of IV fentanyl  and Versed  under continuous physician and nurse monitoring.  Heart rate, blood pressure, and oxygen saturation were continuously monitored.  Total sedation time was 34 minutes.  Ultrasound was used to evaluate the right common femoral artery.  It was patent .  A digital ultrasound image was acquired.  A micropuncture needle was used to access the right common femoral artery under ultrasound guidance.  An 018 wire was advanced without resistance and a micropuncture sheath was placed.  The 018 wire was removed and a benson wire was placed.  The micropuncture sheath was exchanged for a 5 french sheath.  An omniflush catheter was advanced over the wire to the level of L-1.  An abdominal angiogram with CO2 was obtained.  Next, CO2 injections were performed through the sheath in the right groin followed by contrast injections.  The groin was closed with a  Celt Findings:   Aortogram: Renal arteries were not well-visualized.  Aortobifemoral bypass graft appears to be widely patent.  Right Lower Extremity: The right superficial femoral and popliteal artery are occluded as this is bypass graft.  There is contrast opacification in the below-knee popliteal artery.  The tibioperoneal trunk is diffusely diseased.  The dominant runoff is the posterior tibial    Impression:  #1  Patent aortobifemoral bypass graft  #2  Severe occlusive disease in the right leg.  The SFA is occluded.  The tibioperoneal trunk does appear to reconstitute with PT runoff  #3  A total of 24 cc of contrast was utilized   V. [name], M.D., [facility] [place] Office: [phone] Pager:  [phone]

## 2024-05-20 NOTE — Assessment & Plan Note (Signed)
-   Resume Neurontin

## 2024-05-20 NOTE — Progress Notes (Signed)
Patient placed himself on home CPAP.  

## 2024-05-20 NOTE — Assessment & Plan Note (Addendum)
 Holding Eliquis  patient is currently on heparin  continue metoprolol  75 mg po bid if able to tolerate

## 2024-05-20 NOTE — Progress Notes (Signed)
 PHARMACY - ANTICOAGULATION CONSULT NOTE  Pharmacy Consult for heparin  Indication: limb ischemia  Allergies  Allergen Reactions   Doxazosin Mesylate     Other reaction(s): Diarrhea, dizziness   Hydrocodone Nausea Only   Niacin     Other reaction(s): Skin irritation   Oxycodone  Nausea Only   Penicillins Rash    Has patient had a PCN reaction causing immediate rash, facial/tongue/throat swelling, SOB or lightheadedness with hypotension: NO Has patient had a PCN reaction causing severe rash involving mucus membranes or skin necrosis:NO Has patient had a PCN reaction that required hospitalization NO Has patient had a PCN reaction occurring within the last 10 years: NO If all of the above answers are NO, then may proceed with Cephalosporin use.    Sulfa Drugs Cross Reactors Rash   Patient Measurements: Height: 5' 11 (180.3 cm) Weight: 95.7 kg (211 lb) IBW/kg (Calculated) : 75.3 HEPARIN  DW (KG): 94.6  Vital Signs: Temp: 97.6 F (36.4 C) (10/10 0115) Temp Source: Oral (10/10 0115) BP: 136/67 (10/10 0115) Pulse Rate: 71 (10/10 0115)  Labs: Recent Labs    05/19/24 1428 05/19/24 2042 05/20/24 0413  HGB 11.0*  --   --   HCT 37.4*  --   --   PLT 231  --   --   APTT  --   --  111*  HEPARINUNFRC  --   --  >1.10*  CREATININE 3.24* 3.07*  --   CKTOTAL  --  153  --    Estimated Creatinine Clearance: 22.7 mL/min (A) (by C-G formula based on SCr of 3.07 mg/dL (H)).  Assessment: Patient is an 78 YO male presenting with leg calf pain; duplex of R fem-pop graft showing occlusion. Pharmacy consulted to dose heparin  for limb ischemia. Patient on Eliquis  PTA for atrial fibrillation and last dose taken 10/9 in the AM. Initial Hgb 11 and plt WNL. Will monitor initially using aPTT given recent DOAC administration. Will give partial bolus of heparin  given acute clot but recent DOAC administration.   AM: heparin  level falsely elevated and aPTT supra-therapeutic on 1600 units/hr. Per RN, no  signs/symptoms of bleeding. Level drawn appropriately.   Goal of Therapy:  Heparin  level 0.3-0.7 units/ml aPTT 66-102 seconds Monitor platelets by anticoagulation protocol: Yes   Plan:  -Decrease heparin  infusion to 1500 units/h.  -Check aPTT in 8h.  -Monitor aPTT, heparin  level, CBC daily.   Lynwood Poplar, PharmD, BCPS Clinical Pharmacist 05/20/2024 5:09 AM

## 2024-05-20 NOTE — Assessment & Plan Note (Signed)
-  chronic avoid nephrotoxic medications such as NSAIDs, Vanco Zosyn combo,  avoid hypotension, continue to follow renal function

## 2024-05-20 NOTE — Progress Notes (Addendum)
 PROGRESS NOTE    [name]  FMW:[id number] DOB: [birth year] DOA: 05/19/2024 PCP: Okey Carlin Redbird, MD   Brief Narrative:   78 y.o. male with medical history significant of HTN, HLD, DM2, PAD, anemia, CKD 3b, CAD sp CABG, diastolic CHF, Neuropathy, OSA, A.fib eliquis , RBBB, CvA, presented with  right leg pain.  Duplex of R fem-pop graft showing occlusion. Vascular surgery consulted. Plan for angiogram today.  Assessment & Plan:  Active Problems:   Essential hypertension   Hyperlipidemia   DM type 2, controlled, with complication ([name])   CAD (coronary artery disease)   OSA on CPAP   PAF (paroxysmal atrial fibrillation) ([name])   PAD (peripheral artery disease)   Diabetic neuropathy ([name])   Critical limb ischemia of right lower extremity ([name])   CKD (chronic kidney disease) stage 4, GFR 15-29 ml/min ([name])   78 y.o. male with medical history significant of HTN, HLD, DM2, PAD, anemia, CKD 3b, CAD sp CABG, diastolic CHF, Neuropathy, OSA, A.fib eliquis , RBBB, CvA  Admitted for  critical limb ischemia of right lower extremity with bypass graft .  Critical right lower extremity ischemia,POA: Started on a heparin  drip Vascular surgery consulted Plan for angiogram today -He does have hx of right femoral above-knee popliteal artery bypass on 11/30/2020 with Dr. Oris.  He also underwent an aortobifemoral bypass in 1998.  H/o left CEA in 1994 and redo in 2001. -Unable to obtain doppler in right foot.  Type 2 diabetes mellitus with LE peripheral vascular diease,POA: -Continue with SSI  Hyperlipidemia: continue with lipitor 40 mg daily  Hypertension: continue with Toprol  and Amlodipine .  PAF: On eliquis  at home. Holding as he is on a heparin  drip now  CKD3b: Stable, ordered a BMP for the  morning.  Disposition: Lives at home with his wife.   DVT prophylaxis: SCDs Start: 05/20/24 0520     Code Status: Full Code Family Communication:  None at the bedside Status is:  Observation The patient remains OBS appropriate and will d/c before 2 midnights.    Subjective:  Complaining of pain in his RLE. He is on a heparin  drip. Plan for angiogram today.  Examination:  General exam: Appears calm and comfortable  Respiratory system: Clear to auscultation. Respiratory effort normal. Cardiovascular system: S1 & S2 heard, RRR. No JVD, murmurs, rubs, gallops or clicks. No pedal edema. Absent RLE distal pulses Gastrointestinal system: Abdomen is nondistended, soft and nontender. No organomegaly or masses felt. Normal bowel sounds heard. Central nervous system: Alert and oriented. No focal neurological deficits. Extremities: Symmetric 5 x 5 power. Skin: No rashes, lesions or ulcers Psychiatry: Judgement and insight appear normal. Mood & affect appropriate.     Diet Orders (From admission, onward)     Start     Ordered   05/20/24 0001  Diet NPO time specified  Diet effective midnight        05/19/24 2053            Objective: Vitals:   05/20/24 0830 05/20/24 0915 05/20/24 1130 05/20/24 1231  BP: (!) 171/68 (!) 144/77 131/70   Pulse: 77 72 68   Resp: 11 12 14    Temp:      TempSrc:      SpO2: 94% 95% 93% 94%  Weight:      Height:        Intake/Output Summary (Last 24 hours) at 05/20/2024 1320 Last data filed at 05/20/2024 0721 Gross per 24 hour  Intake 1030 ml  Output --  Net 1030 ml   Filed Weights   05/19/24 1426  Weight: 95.7 kg    Scheduled Meds:  [MAR Hold] amLODipine   10 mg Oral Daily   [MAR Hold] atorvastatin   40 mg Oral Daily   [MAR Hold] gabapentin   600 mg Oral QHS   [MAR Hold] insulin  aspart  0-9 Units Subcutaneous Q4H   [MAR Hold] insulin  glargine  15 Units Subcutaneous Daily   [MAR Hold] metoprolol  tartrate  75 mg Oral BID   Continuous Infusions:  heparin  1,500 Units/hr (05/20/24 0957)    Nutritional status     Body mass index is 29.43 kg/m.  Data Reviewed:   CBC: Recent Labs  Lab 05/19/24 1428  05/20/24 0541  WBC 9.2 6.7  NEUTROABS 8.0*  --   HGB 11.0* 10.1*  HCT 37.4* 33.5*  MCV 88.0 86.3  PLT 231 213   Basic Metabolic Panel: Recent Labs  Lab 05/19/24 1428 05/19/24 2042 05/20/24 0541  NA 138 137 141  K 4.0 3.7 3.1*  CL 102 100 101  CO2 23 25 24   GLUCOSE 424* 378* 212*  BUN 45* 45* 36*  CREATININE 3.24* 3.07* 2.84*  CALCIUM  8.6* 8.7* 8.6*  MG  --  1.6* 1.9  PHOS  --  4.0 4.0   GFR: Estimated Creatinine Clearance: 24.5 mL/min (A) (by C-G formula based on SCr of 2.84 mg/dL (H)). Liver Function Tests: Recent Labs  Lab 05/19/24 2042 05/20/24 0541  AST 13* <10*  ALT 12 12  ALKPHOS 66 58  BILITOT 0.8 0.9  PROT 6.4* 6.1*  ALBUMIN  3.4* 3.3*   No results for input(s): LIPASE, AMYLASE in the last 168 hours. No results for input(s): AMMONIA in the last 168 hours. Coagulation Profile: No results for input(s): INR, PROTIME in the last 168 hours. Cardiac Enzymes: Recent Labs  Lab 05/19/24 2042  CKTOTAL 153   BNP (last 3 results) No results for input(s): PROBNP in the last 8760 hours. HbA1C: Recent Labs    05/19/24 2042  HGBA1C 9.1*   CBG: Recent Labs  Lab 05/19/24 1743 05/19/24 2026 05/20/24 0008 05/20/24 0819  GLUCAP 344* 322* 312* 220*   Lipid Profile: No results for input(s): CHOL, HDL, LDLCALC, TRIG, CHOLHDL, LDLDIRECT in the last 72 hours. Thyroid  Function Tests: No results for input(s): TSH, T4TOTAL, FREET4, T3FREE, THYROIDAB in the last 72 hours. Anemia Panel: Recent Labs    05/19/24 2042 05/20/24 0413  VITAMINB12  --  720  FOLATE  --  9.6  FERRITIN 4*  --   TIBC 458*  --   IRON  31*  --   RETICCTPCT 1.4  --    Sepsis Labs: No results for input(s): PROCALCITON, LATICACIDVEN in the last 168 hours.  No results found for this or any previous visit (from the past 240 hours).       Radiology Studies: VAS US  LOWER EXTREMITY BYPASS GRAFT DUPL Result Date: 05/20/2024 LOWER EXTREMITY ARTERIAL  DUPLEX STUDY Patient Name:  Bradley [name] [facility]  Date of Exam:   05/19/2024 Medical Rec #: [id number]        Accession #:    [id number] Date of Birth: April 14, 1946        Patient Gender: M Patient Age:   4 years Exam Location:  [facility] Procedure:      VAS US  LOWER EXTREMITY BYPASS GRAFT DUPLEX Referring Phys: AMJAD ALI --------------------------------------------------------------------------------  Indications: Severe pain that began at noon today. Tech noted right foot  considerably colder than left foot. High Risk Factors: Hypertension, hyperlipidemia, past history of smoking.  Vascular Interventions: Aorta bi-femoral bypass graft 1998.                         Vascular Interventions: Right TCAR 06/01/2020. Right                         femoral to above                         knee bypass graft 11/30/2020. Right femoral                         endarterectomy. Current ABI:            N/A Comparison Study: Prior ABI done 10/29/23 indicated Right:ABI 0.65 TBI 0.33 Left:                   ABI 0.71 TBI 0.60 Prior LEA done 10/15/21 indicated patent                   fem-pop bypass graft Performing Technologist: Alberta Lis RVS  Examination Guidelines: A complete evaluation includes B-mode imaging, spectral Doppler, color Doppler, and power Doppler as needed of all accessible portions of each vessel. Bilateral testing is considered an integral part of a complete examination. Limited examinations for reoccurring indications may be performed as noted.  +-----------+--------+-----+--------+-----------------+--------+ RIGHT      PSV cm/sRatioStenosisWaveform         Comments +-----------+--------+-----+--------+-----------------+--------+ POP Prox                        severely dampened         +-----------+--------+-----+--------+-----------------+--------+ POP Distal                      severely dampened         +-----------+--------+-----+--------+-----------------+--------+ ATA Prox                 occluded                          +-----------+--------+-----+--------+-----------------+--------+ ATA Mid                 occluded                          +-----------+--------+-----+--------+-----------------+--------+ ATA Distal              occluded                          +-----------+--------+-----+--------+-----------------+--------+ PTA Prox                occluded                          +-----------+--------+-----+--------+-----------------+--------+ PTA Mid                 occluded                          +-----------+--------+-----+--------+-----------------+--------+ PTA Distal              occluded                          +-----------+--------+-----+--------+-----------------+--------+  PERO Prox               occluded                          +-----------+--------+-----+--------+-----------------+--------+ PERO Mid                occluded                          +-----------+--------+-----+--------+-----------------+--------+ PERO Distal             occluded                          +-----------+--------+-----+--------+-----------------+--------+  Right Graft #1: Fem-Pop +------------------+--------+--------+--------+--------+                   PSV cm/sStenosisWaveformComments +------------------+--------+--------+--------+--------+ Inflow                                             +------------------+--------+--------+--------+--------+ Prox Anastomosis          occluded                 +------------------+--------+--------+--------+--------+ Proximal Graft            occluded                 +------------------+--------+--------+--------+--------+ Mid Graft                 occluded                 +------------------+--------+--------+--------+--------+ Distal Graft              occluded                 +------------------+--------+--------+--------+--------+ Distal Anastomosis         occluded                 +------------------+--------+--------+--------+--------+ Outflow                   occluded                 +------------------+--------+--------+--------+--------+   Summary: Right: The right femoral-popliteal graft is occluded. No notable 3 vessel run off to foot.  See table(s) above for measurements and observations. Suggest Peripheral Vascular Consult. Electronically signed by Lonni Gaskins MD on 05/20/2024 at 8:44:40 AM.    Final    VAS US  LOWER EXTREMITY VENOUS (DVT) (7a-7p) Result Date: 05/19/2024  Lower Venous DVT Study Patient Name:  RAINEY KAHRS [facility]  Date of Exam:   05/19/2024 Medical Rec #: [id number]        Accession #:    [id number] Date of Birth: Jun 04, 1946        Patient Gender: M Patient Age:   75 years Exam Location:  [facility] Procedure:      VAS US  LOWER EXTREMITY VENOUS (DVT) Referring Phys: AMJAD ALI --------------------------------------------------------------------------------  Indications: Proximal calf pain that began around noon today.  Comparison Study: No prior LEV on file Performing Technologist: Alberta Lis RVS  Examination Guidelines: A complete evaluation includes B-mode imaging, spectral Doppler, color Doppler, and power Doppler as needed of all accessible portions of each vessel. Bilateral testing is considered an integral part of a complete examination. Limited examinations for reoccurring indications may be performed as noted.  The reflux portion of the exam is performed with the patient in reverse Trendelenburg.  +---------+---------------+---------+-----------+----------+--------------+ RIGHT    CompressibilityPhasicitySpontaneityPropertiesThrombus Aging +---------+---------------+---------+-----------+----------+--------------+ CFV      Full           Yes      Yes                                 +---------+---------------+---------+-----------+----------+--------------+ SFJ      Full                                                         +---------+---------------+---------+-----------+----------+--------------+ FV Prox  Full           Yes      Yes                                 +---------+---------------+---------+-----------+----------+--------------+ FV Mid   Full                                                        +---------+---------------+---------+-----------+----------+--------------+ FV DistalFull           Yes      Yes                                 +---------+---------------+---------+-----------+----------+--------------+ PFV      Full                                                        +---------+---------------+---------+-----------+----------+--------------+ POP      Full           Yes      Yes                                 +---------+---------------+---------+-----------+----------+--------------+ PTV      Full                                                        +---------+---------------+---------+-----------+----------+--------------+ PERO     Full                                                        +---------+---------------+---------+-----------+----------+--------------+ Gastroc  Full                                                        +---------+---------------+---------+-----------+----------+--------------+   +----+---------------+---------+-----------+----------+--------------+  LEFTCompressibilityPhasicitySpontaneityPropertiesThrombus Aging +----+---------------+---------+-----------+----------+--------------+ CFV Full           Yes      Yes                                 +----+---------------+---------+-----------+----------+--------------+ SFJ Full                                                        +----+---------------+---------+-----------+----------+--------------+    Summary: RIGHT: - There is no evidence of deep vein thrombosis in the lower extremity.  - No cystic structure found in  the popliteal fossa.  LEFT: - No evidence of common femoral vein obstruction.   *See table(s) above for measurements and observations. Electronically signed by Norman Serve on 05/19/2024 at 5:18:13 PM.    Final            LOS: 0 days   Time spent= 37 mins    Deliliah Room, MD [facility]  If 7PM-7AM, please contact night-coverage  05/20/2024, 1:20 PM

## 2024-05-21 DIAGNOSIS — I70221 Atherosclerosis of native arteries of extremities with rest pain, right leg: Secondary | ICD-10-CM | POA: Diagnosis not present

## 2024-05-21 LAB — CBC
HCT: 34.3 % — ABNORMAL LOW (ref 39.0–52.0)
Hemoglobin: 10.4 g/dL — ABNORMAL LOW (ref 13.0–17.0)
MCH: 25.9 pg — ABNORMAL LOW (ref 26.0–34.0)
MCHC: 30.3 g/dL (ref 30.0–36.0)
MCV: 85.5 fL (ref 80.0–100.0)
Platelets: 206 K/uL (ref 150–400)
RBC: 4.01 MIL/uL — ABNORMAL LOW (ref 4.22–5.81)
RDW: 18 % — ABNORMAL HIGH (ref 11.5–15.5)
WBC: 6 K/uL (ref 4.0–10.5)
nRBC: 0 % (ref 0.0–0.2)

## 2024-05-21 LAB — BASIC METABOLIC PANEL WITH GFR
Anion gap: 9 (ref 5–15)
BUN: 32 mg/dL — ABNORMAL HIGH (ref 8–23)
CO2: 26 mmol/L (ref 22–32)
Calcium: 8.7 mg/dL — ABNORMAL LOW (ref 8.9–10.3)
Chloride: 106 mmol/L (ref 98–111)
Creatinine, Ser: 2.62 mg/dL — ABNORMAL HIGH (ref 0.61–1.24)
GFR, Estimated: 24 mL/min — ABNORMAL LOW (ref >60)
Glucose, Bld: 176 mg/dL — ABNORMAL HIGH (ref 70–99)
Potassium: 3.2 mmol/L — ABNORMAL LOW (ref 3.5–5.1)
Sodium: 141 mmol/L (ref 135–145)

## 2024-05-21 LAB — LIPID PANEL
Cholesterol: 97 mg/dL (ref 0–200)
HDL: 29 mg/dL — ABNORMAL LOW (ref >40)
LDL Cholesterol: 33 mg/dL (ref 0–99)
Total CHOL/HDL Ratio: 3.3 ratio
Triglycerides: 177 mg/dL — ABNORMAL HIGH (ref <150)
VLDL: 35 mg/dL (ref 0–40)

## 2024-05-21 LAB — GLUCOSE, CAPILLARY
Glucose-Capillary: 155 mg/dL — ABNORMAL HIGH (ref 70–99)
Glucose-Capillary: 184 mg/dL — ABNORMAL HIGH (ref 70–99)
Glucose-Capillary: 217 mg/dL — ABNORMAL HIGH (ref 70–99)

## 2024-05-21 LAB — APTT: aPTT: 28 s (ref 24–36)

## 2024-05-21 MED ORDER — POTASSIUM CHLORIDE 20 MEQ PO PACK
40.0000 meq | PACK | Freq: Once | ORAL | Status: AC
  Administered 2024-05-21: 40 meq via ORAL
  Filled 2024-05-21: qty 2

## 2024-05-21 NOTE — Progress Notes (Addendum)
 Progress Note    05/21/2024 9:31 AM 1 Day Post-Op  Subjective:  no complaints. Says that right leg pain that he presented with is no longer present. Explains that he has had several episodes of this cramping pain. Presently resolved. Denies any numbness or tingling in right leg or foot   Vitals:   05/21/24 0412 05/21/24 0821  BP: 123/62 (!) 145/62  Pulse: 61 62  Resp: 20 17  Temp: (!) 97.4 F (36.3 C) 97.6 F (36.4 C)  SpO2: 95% 97%   Physical Exam: Cardiac:  regular Lungs:  non labored Incisions:  Right CF access site soft without swelling or hematoma Extremities:  RLE warm and well perfused. No distal pulses palpable Abdomen:  soft Neurologic: alert and oriented  CBC    Component Value Date/Time   WBC 6.0 05/21/2024 0404   RBC 4.01 (L) 05/21/2024 0404   HGB 10.4 (L) 05/21/2024 0404   HGB 13.9 02/01/2020 1219   HCT 34.3 (L) 05/21/2024 0404   HCT 39.6 02/01/2020 1219   PLT 206 05/21/2024 0404   PLT 258 02/01/2020 1219   MCV 85.5 05/21/2024 0404   MCV 98 (H) 02/01/2020 1219   MCH 25.9 (L) 05/21/2024 0404   MCHC 30.3 05/21/2024 0404   RDW 18.0 (H) 05/21/2024 0404   RDW 13.1 02/01/2020 1219   LYMPHSABS 0.7 05/19/2024 1428   MONOABS 0.4 05/19/2024 1428   EOSABS 0.1 05/19/2024 1428   BASOSABS 0.0 05/19/2024 1428    BMET    Component Value Date/Time   NA 141 05/21/2024 0404   NA 134 02/01/2020 1219   K 3.2 (L) 05/21/2024 0404   CL 106 05/21/2024 0404   CO2 26 05/21/2024 0404   GLUCOSE 176 (H) 05/21/2024 0404   BUN 32 (H) 05/21/2024 0404   BUN 21 02/01/2020 1219   CREATININE 2.62 (H) 05/21/2024 0404   CREATININE 1.76 (H) 09/19/2015 1111   CALCIUM  8.7 (L) 05/21/2024 0404   GFRNONAA 24 (L) 05/21/2024 0404   GFRAA 41 (L) 02/01/2020 1219    INR    Component Value Date/Time   INR 1.0 11/30/2020 0647     Intake/Output Summary (Last 24 hours) at 05/21/2024 0931 Last data filed at 05/21/2024 0535 Gross per 24 hour  Intake 731.69 ml  Output 1600 ml   Net -868.31 ml     Assessment/Plan:  78 y.o. male is s/p Abdominal Aortogram, RLE angiogram 1 Day Post-Op   R common femoral access site soft without hematoma Patent aortobifemoral bypass Known right bypass occlusion with SFA occlusion and tibial disease with single PT runoff Discussed with patient in depth that he could be candidate for redo bypass however symptoms in leg are now resolved which means it is less likely related to his lower extremity perfusion Also with his severe tibial disease he does not have a great target so the long term patency of a redo bypass is probably minimal At this time we do not recommend surgical intervention Hemodynamically stable Scr at baseline We reviewed concerning signs and symptoms with patient and her understands to call our office should he have a recurrence of his pain Encourage mobilizing as much as tolerated Recommend Aspirin  and statin Stable for discharge from vascular standpoint Will arrange follow up in 2-3 weeks with Dr. Magda Teretha Damme, PA-C [facility] [phone] 05/21/2024 9:31 AM  I agree with the above.  I have seen and evaluated the patient.  He is no longer having right leg pain.  He describes several episodes of cramping.  This did not occur with activity.  His bypass graft has been occluded for at least several months.  He has limited options for revascularization but his next operation would likely be a right femoral to tibioperoneal trunk bypass graft.  He does not have vein.  I discussed that without acute ischemia, rest pain, or a nonhealing wound, I would not recommend revascularization.  Will arrange follow-up in several weeks.  [name]

## 2024-05-21 NOTE — Discharge Summary (Signed)
 Physician Discharge Summary   Patient: Bradley [name] MRN: [id number] DOB: 07/16/1946  Admit date:     05/19/2024  Discharge date: 05/21/24  Discharge Physician: Deliliah Room   PCP: Okey Carlin Redbird, MD   Recommendations at discharge:    F/u with your PCP in one week. F/u with Vascular, Dr Magda in 2-3 weeks Continue taking meds as prescribed  Discharge Diagnoses: Principal Problem:   Critical limb ischemia of right lower extremity ([name]) Active Problems:   Essential hypertension   Hyperlipidemia   DM type 2, controlled, with complication ([name])   CAD (coronary artery disease)   OSA on CPAP   PAF (paroxysmal atrial fibrillation) ([name])   PAD (peripheral artery disease)   Diabetic neuropathy ([name])   CKD (chronic kidney disease) stage 4, GFR 15-29 ml/min [facility])   Hospital Course:  78 y.o. male with medical history significant of HTN, HLD, DM2, PAD, anemia, CKD 3b, CAD sp CABG, diastolic CHF, Neuropathy, OSA, A.fib eliquis , RBBB, CvA, presented with  right leg pain.  Duplex of R fem-pop graft showing occlusion. Vascular surgery consulted.RLE angiogram done on 10/10. It showed Patent aortobifemoral bypass, Known right bypass occlusion with SFA occlusion and tibial disease with single PT runoff. No surgical intervention recommended.  Continue with aspirin  and statin Outpatient f/u with Dr Magda in 2-3 weeks. Discharged in a stable condition. He lives at home with his wife.       Consultants: Vascular Procedures performed: RLE angiogram  Disposition: Home Diet recommendation:  Carb modified diet DISCHARGE MEDICATION: Allergies as of 05/21/2024       Reactions   Doxazosin Mesylate    Other reaction(s): Diarrhea, dizziness   Hydrocodone Nausea Only   Niacin    Other reaction(s): Skin irritation   Oxycodone  Nausea Only   Penicillins Rash   Has patient had a PCN reaction causing immediate rash, facial/tongue/throat swelling, SOB or lightheadedness with hypotension:  NO Has patient had a PCN reaction causing severe rash involving mucus membranes or skin necrosis:NO Has patient had a PCN reaction that required hospitalization NO Has patient had a PCN reaction occurring within the last 10 years: NO If all of the above answers are NO, then may proceed with Cephalosporin use.   Sulfa Drugs Cross Reactors Rash        Medication List     TAKE these medications    amLODipine  10 MG tablet Commonly known as: NORVASC  Take 1 tablet (10 mg total) by mouth daily.   apixaban  5 MG Tabs tablet Commonly known as: ELIQUIS  Take 1 tablet (5 mg total) by mouth 2 (two) times daily.   aspirin  EC 81 MG tablet Take 81 mg by mouth daily. Swallow whole.   atorvastatin  40 MG tablet Commonly known as: LIPITOR Take 40 mg by mouth daily.   dexlansoprazole 60 MG capsule Commonly known as: DEXILANT Take 60 mg by mouth daily.   famotidine  20 MG tablet Commonly known as: PEPCID  Take 20 mg by mouth at bedtime.   gabapentin  300 MG capsule Commonly known as: NEURONTIN  Take 600 mg by mouth at bedtime.   HumaLOG  KwikPen 100 UNIT/ML KwikPen Generic drug: insulin  lispro Inject 25-30 Units into the skin 3 (three) times daily.   hydrALAZINE  25 MG tablet Commonly known as: APRESOLINE  Take 1 tablet (25 mg total) by mouth in the morning and at bedtime.   hydrocortisone  2.5 % rectal cream Commonly known as: ANUSOL -HC Place 1 application rectally daily as needed for hemorrhoids.   ipratropium 0.03 % nasal spray  Commonly known as: ATROVENT Place 2 sprays into both nostrils 2 (two) times daily.   ketoconazole  2 % shampoo Commonly known as: NIZORAL  Apply 1 application topically daily as needed for irritation.   Melatonin 5 MG Chew Chew 10 mg by mouth at bedtime.   methocarbamol  500 MG tablet Commonly known as: ROBAXIN  Take 500 mg by mouth every 6 (six) hours as needed for muscle spasms.   metoprolol  tartrate 50 MG tablet Commonly known as: LOPRESSOR  TAKE 1  AND 1/2 TABLETS BY MOUTH TWICE DAILY   nitroGLYCERIN  0.3 MG SL tablet Commonly known as: NITROSTAT  Place 1 tablet (0.3 mg total) under the tongue every 5 (five) minutes as needed for chest pain. Only as needed   testosterone  cypionate 200 MG/ML injection Commonly known as: DEPOTESTOSTERONE CYPIONATE Inject 200 mg into the muscle once a week.   torsemide  20 MG tablet Commonly known as: DEMADEX  Take 1 tablet (20 mg total) by mouth daily.   traMADol  50 MG tablet Commonly known as: ULTRAM  Take 1-2 tablets (50-100 mg total) by mouth every 6 (six) hours as needed for moderate pain (pain score 4-6) or severe pain (pain score 7-10).   Tresiba  FlexTouch 100 UNIT/ML FlexTouch Pen Generic drug: insulin  degludec Inject 25 Units into the skin daily.   Trulicity  3 MG/0.5ML Sopn Inject 3 mg into the skin every Sunday.   Vitamin D3 125 MCG (5000 UT) Tabs Take 5,000 Units by mouth daily.        Follow-up Information     Magda Debby SAILOR, MD Follow up.   Specialties: Vascular Surgery, Interventional Cardiology Why: 2-3 weeks. The office will call you with your appointment Contact information: [address] [place] [address] [phone]         Okey Carlin Redbird, MD. Schedule an appointment as soon as possible for a visit in 1 week(s).   Specialty: Family Medicine Contact information: [address] [place] [address] [phone]                Discharge Exam: Filed Weights   05/19/24 1426  Weight: 95.7 kg   Constitutional: NAD, calm, comfortable Eyes: PERRL, lids and conjunctivae normal ENMT: Mucous membranes are moist. Posterior pharynx clear of any exudate or lesions.Normal dentition.  Neck: normal, supple, no masses, no thyromegaly Respiratory: clear to auscultation bilaterally, no wheezing, no crackles. Normal respiratory effort. No accessory muscle use.  Cardiovascular: Regular rate and rhythm, no murmurs / rubs / gallops. No extremity  edema. 2+ pedal pulses. No carotid bruits.  Abdomen: no tenderness, no masses palpated. No hepatosplenomegaly. Bowel sounds positive.  Musculoskeletal: no clubbing / cyanosis. No joint deformity upper and lower extremities. Good ROM, no contractures. Normal muscle tone.  Skin: no rashes, lesions, ulcers. No induration Neurologic: CN 2-12 grossly intact. Sensation intact, DTR normal. Strength 5/5 x all 4 extremities.  Psychiatric: Normal judgment and insight. Alert and oriented x 3. Normal mood.    Condition at discharge: good  The results of significant diagnostics from this hospitalization (including imaging, microbiology, ancillary and laboratory) are listed below for reference.   Imaging Studies: PERIPHERAL VASCULAR CATHETERIZATION Result Date: 05/20/2024 Images from the original result were not included. Patient name: Bradley [name] MRN: [id number] DOB: Jan 01, 1946 Sex: male 05/19/2024 - 05/20/2024 Pre-operative Diagnosis: Right leg pain Post-operative diagnosis:  Same Surgeon:  [name] Procedure Performed:  1.  Ultrasound-guided access, right femoral artery  2.  Abdominal aortogram with CO2  3.  Right leg angiogram  4.  Conscious sedation,  34 minutes  5.  Closure device, Celt Indications: This is a 78 year old gentleman with significant vascular history who came into the hospital with right leg pain.  He has a history of a right femoral to above-knee popliteal bypass graft that is known to be occluded.  He also has a history of an aortobifemoral bypass.  He has chronic renal sufficiency with a creatinine slightly over 3 at admission but is down to just below that currently Procedure:  The patient was identified in the holding area and taken to room 8.  The patient was then placed supine on the table and prepped and draped in the usual sterile fashion.  A time out was called.  Conscious sedation was administered with the use of IV fentanyl  and Versed  under continuous physician and nurse  monitoring.  Heart rate, blood pressure, and oxygen saturation were continuously monitored.  Total sedation time was 34 minutes.  Ultrasound was used to evaluate the right common femoral artery.  It was patent .  A digital ultrasound image was acquired.  A micropuncture needle was used to access the right common femoral artery under ultrasound guidance.  An 018 wire was advanced without resistance and a micropuncture sheath was placed.  The 018 wire was removed and a benson wire was placed.  The micropuncture sheath was exchanged for a 5 french sheath.  An omniflush catheter was advanced over the wire to the level of L-1.  An abdominal angiogram with CO2 was obtained.  Next, CO2 injections were performed through the sheath in the right groin followed by contrast injections.  The groin was closed with a Celt Findings:  Aortogram: Renal arteries were not well-visualized.  Aortobifemoral bypass graft appears to be widely patent.  Right Lower Extremity: The right superficial femoral and popliteal artery are occluded as this is bypass graft.  There is contrast opacification in the below-knee popliteal artery.  The tibioperoneal trunk is diffusely diseased.  The dominant runoff is the posterior tibial Impression:  #1  Patent aortobifemoral bypass graft  #2  Severe occlusive disease in the right leg.  The SFA is occluded.  The tibioperoneal trunk does appear to reconstitute with PT runoff  #3  A total of 24 cc of contrast was utilized V. [name], M.D., [facility] [place] Office: [phone] Pager:  [phone]   VAS US  LOWER EXTREMITY BYPASS GRAFT DUPL Result Date: 05/20/2024 LOWER EXTREMITY ARTERIAL DUPLEX STUDY Patient Name:  Bradley [name] [facility]  Date of Exam:   05/19/2024 Medical Rec #: [id number]        Accession #:    [id number] Date of Birth: 10-24-1945        Patient Gender: M Patient Age:   73 years Exam Location:  [facility] Procedure:      VAS US  LOWER EXTREMITY BYPASS  GRAFT DUPLEX Referring Phys: AMJAD ALI --------------------------------------------------------------------------------  Indications: Severe pain that began at noon today. Tech noted right foot              considerably colder than left foot. High Risk Factors: Hypertension, hyperlipidemia, past history of smoking.  Vascular Interventions: Aorta bi-femoral bypass graft 1998.                         Vascular Interventions: Right TCAR 06/01/2020. Right                         femoral to above  knee bypass graft 11/30/2020. Right femoral                         endarterectomy. Current ABI:            N/A Comparison Study: Prior ABI done 10/29/23 indicated Right:ABI 0.65 TBI 0.33 Left:                   ABI 0.71 TBI 0.60 Prior LEA done 10/15/21 indicated patent                   fem-pop bypass graft Performing Technologist: Alberta Lis RVS  Examination Guidelines: A complete evaluation includes B-mode imaging, spectral Doppler, color Doppler, and power Doppler as needed of all accessible portions of each vessel. Bilateral testing is considered an integral part of a complete examination. Limited examinations for reoccurring indications may be performed as noted.  +-----------+--------+-----+--------+-----------------+--------+ RIGHT      PSV cm/sRatioStenosisWaveform         Comments +-----------+--------+-----+--------+-----------------+--------+ POP Prox                        severely dampened         +-----------+--------+-----+--------+-----------------+--------+ POP Distal                      severely dampened         +-----------+--------+-----+--------+-----------------+--------+ ATA Prox                occluded                          +-----------+--------+-----+--------+-----------------+--------+ ATA Mid                 occluded                          +-----------+--------+-----+--------+-----------------+--------+ ATA Distal               occluded                          +-----------+--------+-----+--------+-----------------+--------+ PTA Prox                occluded                          +-----------+--------+-----+--------+-----------------+--------+ PTA Mid                 occluded                          +-----------+--------+-----+--------+-----------------+--------+ PTA Distal              occluded                          +-----------+--------+-----+--------+-----------------+--------+ PERO Prox               occluded                          +-----------+--------+-----+--------+-----------------+--------+ PERO Mid                occluded                          +-----------+--------+-----+--------+-----------------+--------+ PERO Distal  occluded                          +-----------+--------+-----+--------+-----------------+--------+  Right Graft #1: Fem-Pop +------------------+--------+--------+--------+--------+                   PSV cm/sStenosisWaveformComments +------------------+--------+--------+--------+--------+ Inflow                                             +------------------+--------+--------+--------+--------+ Prox Anastomosis          occluded                 +------------------+--------+--------+--------+--------+ Proximal Graft            occluded                 +------------------+--------+--------+--------+--------+ Mid Graft                 occluded                 +------------------+--------+--------+--------+--------+ Distal Graft              occluded                 +------------------+--------+--------+--------+--------+ Distal Anastomosis        occluded                 +------------------+--------+--------+--------+--------+ Outflow                   occluded                 +------------------+--------+--------+--------+--------+   Summary: Right: The right femoral-popliteal graft is occluded. No  notable 3 vessel run off to foot.  See table(s) above for measurements and observations. Suggest Peripheral Vascular Consult. Electronically signed by Lonni Gaskins MD on 05/20/2024 at 8:44:40 AM.    Final    VAS US  LOWER EXTREMITY VENOUS (DVT) (7a-7p) Result Date: 05/19/2024  Lower Venous DVT Study Patient Name:  Bradley [name] [facility]  Date of Exam:   05/19/2024 Medical Rec #: [id number]        Accession #:    [id number] Date of Birth: 03-Mar-1946        Patient Gender: M Patient Age:   85 years Exam Location:  [facility] Procedure:      VAS US  LOWER EXTREMITY VENOUS (DVT) Referring Phys: AMJAD ALI --------------------------------------------------------------------------------  Indications: Proximal calf pain that began around noon today.  Comparison Study: No prior LEV on file Performing Technologist: Alberta Lis RVS  Examination Guidelines: A complete evaluation includes B-mode imaging, spectral Doppler, color Doppler, and power Doppler as needed of all accessible portions of each vessel. Bilateral testing is considered an integral part of a complete examination. Limited examinations for reoccurring indications may be performed as noted. The reflux portion of the exam is performed with the patient in reverse Trendelenburg.  +---------+---------------+---------+-----------+----------+--------------+ RIGHT    CompressibilityPhasicitySpontaneityPropertiesThrombus Aging +---------+---------------+---------+-----------+----------+--------------+ CFV      Full           Yes      Yes                                 +---------+---------------+---------+-----------+----------+--------------+ SFJ      Full                                                        +---------+---------------+---------+-----------+----------+--------------+  FV Prox  Full           Yes      Yes                                 +---------+---------------+---------+-----------+----------+--------------+ FV Mid    Full                                                        +---------+---------------+---------+-----------+----------+--------------+ FV DistalFull           Yes      Yes                                 +---------+---------------+---------+-----------+----------+--------------+ PFV      Full                                                        +---------+---------------+---------+-----------+----------+--------------+ POP      Full           Yes      Yes                                 +---------+---------------+---------+-----------+----------+--------------+ PTV      Full                                                        +---------+---------------+---------+-----------+----------+--------------+ PERO     Full                                                        +---------+---------------+---------+-----------+----------+--------------+ Gastroc  Full                                                        +---------+---------------+---------+-----------+----------+--------------+   +----+---------------+---------+-----------+----------+--------------+ LEFTCompressibilityPhasicitySpontaneityPropertiesThrombus Aging +----+---------------+---------+-----------+----------+--------------+ CFV Full           Yes      Yes                                 +----+---------------+---------+-----------+----------+--------------+ SFJ Full                                                        +----+---------------+---------+-----------+----------+--------------+    Summary: RIGHT: - There is no evidence of deep  vein thrombosis in the lower extremity.  - No cystic structure found in the popliteal fossa.  LEFT: - No evidence of common femoral vein obstruction.   *See table(s) above for measurements and observations. Electronically signed by Norman Serve on 05/19/2024 at 5:18:13 PM.    Final     Microbiology: Results for orders placed or performed  during the hospital encounter of 08/18/23  Surgical pcr screen     Status: None   Collection Time: 08/18/23 11:57 AM   Specimen: Nasal Mucosa; Nasal Swab  Result Value Ref Range Status   MRSA, PCR NEGATIVE NEGATIVE Final   Staphylococcus aureus NEGATIVE NEGATIVE Final    Comment: (NOTE) The Xpert SA Assay (FDA approved for NASAL specimens in patients 14 years of age and older), is one component of a comprehensive surveillance program. It is not intended to diagnose infection nor to guide or monitor treatment. Performed at [facility], [address], [place], [place] [address]     Labs: CBC: Recent Labs  Lab 05/19/24 1428 05/20/24 0541 05/21/24 0404  WBC 9.2 6.7 6.0  NEUTROABS 8.0*  --   --   HGB 11.0* 10.1* 10.4*  HCT 37.4* 33.5* 34.3*  MCV 88.0 86.3 85.5  PLT 231 213 206   Basic Metabolic Panel: Recent Labs  Lab 05/19/24 1428 05/19/24 2042 05/20/24 0541 05/21/24 0404  NA 138 137 141 141  K 4.0 3.7 3.1* 3.2*  CL 102 100 101 106  CO2 23 25 24 26   GLUCOSE 424* 378* 212* 176*  BUN 45* 45* 36* 32*  CREATININE 3.24* 3.07* 2.84* 2.62*  CALCIUM  8.6* 8.7* 8.6* 8.7*  MG  --  1.6* 1.9  --   PHOS  --  4.0 4.0  --    Liver Function Tests: Recent Labs  Lab 05/19/24 2042 05/20/24 0541  AST 13* <10*  ALT 12 12  ALKPHOS 66 58  BILITOT 0.8 0.9  PROT 6.4* 6.1*  ALBUMIN  3.4* 3.3*   CBG: Recent Labs  Lab 05/20/24 1710 05/20/24 2029 05/21/24 0043 05/21/24 0418 05/21/24 1124  GLUCAP 178* 247* 217* 155* 184*    Discharge time spent: 42 minutes.  Signed: Deliliah Room, MD [facility] 05/21/2024

## 2024-05-21 NOTE — Progress Notes (Signed)
 Pt is alert an fully oriented x 4, afebrile, stable hemodynamically, Atrial fibrillation on the monitor, HR under controlled at 60s-70s, On CPAP at night,normal respiratory effort, no obvious acute distress noted.   Right groin vascular accessed site is soft, having negative for bleeding or hematoma. Pt has absent DP and PT pulses with doppler on right leg. MD is aware. On his left leg has brisk signals on PT and DP.  Pain is well tolerated. Pt is able to rest with no major complaints overnight. Plan of care is reviewed. Pt possibly will undergo redo-vascular surgery next week.   Dr. Serene and our clinical pharmacist, Rocky, had clarified that no requirement for restarting Heparin  tonight. We will continue to monitor.   Wendi Dash, RN

## 2024-05-21 NOTE — Plan of Care (Signed)

## 2024-05-21 NOTE — Evaluation (Signed)
 Physical Therapy Brief Evaluation and Discharge Note Patient Details Name: Bradley [name] MRN: [id number] DOB: [birth year] Today's Date: 05/21/2024   History of Present Illness  Pt is a 78 y.o. male presented to [facility] ED 05/19/24 for RLE pain. Duplex of R fem-pop graft showing occlusion. Pt s/p abdominal aortogram and right leg angiogram 10/10. PMHx:  HTN, HLD, DM2, anemia, CKD 3b, CAD sp CABG, diastolic CHF, neuropathy, OSA, A.fib eliquis , RBBB, CVA, PAD, chronic claudication; Hx of L carotid endarterectomy in 1994 and redo in 2001, aortobifemoral bypass in 1998,  R TCAR (06/01/2020), and R femoral above-knee popliteal artery bypass (11/30/2020).   Clinical Impression  Pt greeted supine in bed, pleasant and agreeable to PT evaluation. PTA, pt was modI for functional mobility using rollator and independent with ADLs/IADLs and driving. He lives with his wife in a two story house with 1 STE. Pt is able to reside on the main floor. He reports being active with OPPT currently. Pt performed bed mobility with modI, sit<>stand and gait using RW with supervision, and required CGA for stairs. He ambulated ~240ft with a step-through gait pattern. Educated pt on stair training in accordance with his home set up. Instructed him to ascend with LLE and descend with RLE. Discussed how his wife should be positioned to support him. Pt feels ready and safe for discharge home. I have answered all his questions related to mobility.      PT Assessment Patient does not need any further PT services  Assistance Needed at Discharge  PRN    Equipment Recommendations None recommended by PT (Pt already has DME)  Recommendations for Other Services       Precautions/Restrictions Precautions Precautions: Fall Recall of Precautions/Restrictions: Intact Restrictions Weight Bearing Restrictions Per Provider Order: No        Mobility  Bed Mobility   Supine/Sidelying to sit: Modified independent (Device/Increased  time) Sit to supine/sidelying: Modified independent (Device/Increased time) General bed mobility comments: Pt sat up on R side of bed with increased time. HOB flat. Use of bed rail.  Transfers Overall transfer level: Needs assistance Equipment used: Rolling walker (2 wheels) Transfers: Sit to/from Stand Sit to Stand: Supervision           General transfer comment: Pt stood from lowest bed height. Demonstrated proper hand placement using RW. Powered up without physical assist. Good eccentric control.    Ambulation/Gait Ambulation/Gait assistance: Supervision Gait Distance (Feet): 200 Feet Assistive device: Rolling walker (2 wheels) Gait Pattern/deviations: Step-through pattern, Decreased stride length, Trunk flexed Gait Speed: Pace WFL General Gait Details: Pt ambulated with a reciprocal gait pattern, even weight shift, and good foot clearence. He maintained a slight fwd lean and AD a little far out likely d/t use of rollator typically at baseline. Cues for proximity to AD. He navigated room/hallway well, no LOB.  Home Activity Instructions    Stairs Stairs: Yes Stairs assistance: Contact guard assist Stair Management: One rail Right, Forwards, Step to pattern Number of Stairs: 2 (x2) General stair comments: Educated pt to ascend with LLE and descend with RLE. He reports a wall or door frame present that he can hold onto at his step. Discussed how his wife should be positioned to support him and possibility of HHA. Pt went up/down steps twice without LOB.  Modified Rankin (Stroke Patients Only)        Balance Overall balance assessment: Mild deficits observed, not formally tested  Pertinent Vitals/Pain PT - Brief Vital Signs All Vital Signs Stable: Yes Pain Assessment Pain Assessment: Faces Faces Pain Scale: Hurts a little bit Pain Location: RLE Pain Descriptors / Indicators: Discomfort, Aching, Sore Pain Intervention(s): Monitored  during session, Limited activity within patient's tolerance     Home Living Family/patient expects to be discharged to:: Private residence Living Arrangements: Spouse/significant other Available Help at Discharge: Family;Available 24 hours/day Home Environment: Stairs to enter;No rail  Stairs-Number of Steps: 1 Home Equipment: Agricultural consultant (2 wheels);Rollator (4 wheels);Cane - single point;Shower seat - built in;Hand held shower head;Grab bars - tub/shower        Prior Function Level of Independence: Independent with assistive device(s)      UE/LE Assessment   UE ROM/Strength/Tone/Coordination: WFL    LE ROM/Strength/Tone/Coordination: [facility]      Communication   Communication Communication: No apparent difficulties     Cognition Overall Cognitive Status: Appears within functional limits for tasks assessed/performed       General Comments      Exercises     Assessment/Plan    PT Problem List         PT Visit Diagnosis Other abnormalities of gait and mobility (R26.89);Difficulty in walking, not elsewhere classified (R26.2);Unsteadiness on feet (R26.81)    No Skilled PT Patient is supervision for all activity/mobility;Patient will have necessary level of assist by caregiver at discharge;All education completed   Co-evaluation                AMPAC 6 Clicks Help needed turning from your back to your side while in a flat bed without using bedrails?: None Help needed moving from lying on your back to sitting on the side of a flat bed without using bedrails?: None Help needed moving to and from a bed to a chair (including a wheelchair)?: A Little Help needed standing up from a chair using your arms (e.g., wheelchair or bedside chair)?: A Little Help needed to walk in hospital room?: A Little Help needed climbing 3-5 steps with a railing? : A Little 6 Click Score: 20      End of Session Equipment Utilized During Treatment: Gait belt Activity Tolerance:  Patient tolerated treatment well Patient left: in bed;with call bell/phone within reach Nurse Communication: Mobility status PT Visit Diagnosis: Other abnormalities of gait and mobility (R26.89);Difficulty in walking, not elsewhere classified (R26.2);Unsteadiness on feet (R26.81)     Time: 8857-8799 PT Time Calculation (min) (ACUTE ONLY): 18 min  Charges:   PT Evaluation $PT Eval Low Complexity: 1 Low      Randall SAUNDERS, PT, DPT Acute Rehabilitation Services Office: [phone] Secure Chat Preferred  Delon CHRISTELLA Callander  05/21/2024, 12:16 PM

## 2024-05-23 ENCOUNTER — Encounter (HOSPITAL_COMMUNITY): Payer: Self-pay | Admitting: Surgery

## 2024-05-24 ENCOUNTER — Ambulatory Visit (HOSPITAL_BASED_OUTPATIENT_CLINIC_OR_DEPARTMENT_OTHER): Admitting: Physical Therapy

## 2024-05-27 ENCOUNTER — Encounter (HOSPITAL_BASED_OUTPATIENT_CLINIC_OR_DEPARTMENT_OTHER): Admitting: Physical Therapy

## 2024-05-31 ENCOUNTER — Encounter (HOSPITAL_BASED_OUTPATIENT_CLINIC_OR_DEPARTMENT_OTHER): Admitting: Physical Therapy

## 2024-05-31 DIAGNOSIS — Z6830 Body mass index (BMI) 30.0-30.9, adult: Secondary | ICD-10-CM | POA: Diagnosis not present

## 2024-05-31 DIAGNOSIS — I70221 Atherosclerosis of native arteries of extremities with rest pain, right leg: Secondary | ICD-10-CM | POA: Diagnosis not present

## 2024-05-31 DIAGNOSIS — I739 Peripheral vascular disease, unspecified: Secondary | ICD-10-CM | POA: Diagnosis not present

## 2024-05-31 DIAGNOSIS — Z23 Encounter for immunization: Secondary | ICD-10-CM | POA: Diagnosis not present

## 2024-05-31 DIAGNOSIS — G8929 Other chronic pain: Secondary | ICD-10-CM | POA: Diagnosis not present

## 2024-05-31 DIAGNOSIS — M79673 Pain in unspecified foot: Secondary | ICD-10-CM | POA: Diagnosis not present

## 2024-05-31 DIAGNOSIS — G479 Sleep disorder, unspecified: Secondary | ICD-10-CM | POA: Diagnosis not present

## 2024-05-31 DIAGNOSIS — E1142 Type 2 diabetes mellitus with diabetic polyneuropathy: Secondary | ICD-10-CM | POA: Diagnosis not present

## 2024-06-01 ENCOUNTER — Telehealth: Payer: Self-pay | Admitting: *Deleted

## 2024-06-01 NOTE — Telephone Encounter (Signed)
 Copied from CRM [id number]. Topic: Appointments - Scheduling Inquiry for Clinic >> Jun 01, 2024  1:29 PM Anairis L wrote: Reason for CRM: Wife Vicky Mould is requesting a call back at [phone]  to schedule a new patient app for her husband.She claims Dr.G gave her the green light.  Thank you.

## 2024-06-03 ENCOUNTER — Ambulatory Visit (HOSPITAL_BASED_OUTPATIENT_CLINIC_OR_DEPARTMENT_OTHER): Admitting: Physical Therapy

## 2024-06-03 NOTE — Telephone Encounter (Signed)
 Lvm on [phone] for patient to call back to schedule appt for husband

## 2024-06-03 NOTE — Telephone Encounter (Signed)
 Pt wife called back to schedule new pt appt, please return phone call. Thank you

## 2024-06-03 NOTE — Telephone Encounter (Addendum)
 May schedule appt with me.  However it may take a while to get new pt appt - recommend he continue f/u with current PCP until then.

## 2024-06-07 ENCOUNTER — Ambulatory Visit: Payer: PPO | Admitting: Podiatry

## 2024-06-07 ENCOUNTER — Encounter (HOSPITAL_BASED_OUTPATIENT_CLINIC_OR_DEPARTMENT_OTHER): Admitting: Physical Therapy

## 2024-06-07 ENCOUNTER — Encounter: Payer: Self-pay | Admitting: Podiatry

## 2024-06-07 DIAGNOSIS — E1159 Type 2 diabetes mellitus with other circulatory complications: Secondary | ICD-10-CM | POA: Diagnosis not present

## 2024-06-07 DIAGNOSIS — D689 Coagulation defect, unspecified: Secondary | ICD-10-CM

## 2024-06-07 NOTE — Addendum Note (Signed)
 Addended by: LOREDA HACKER on: 06/07/2024 04:40 PM   Modules accepted: Level of Service

## 2024-06-07 NOTE — Progress Notes (Signed)
 This patient presents to the office with chief complaint diabetic feet.  This patient  says there  is  no pain and discomfort in his  feet.    Patient has no history of infection or drainage from both feet.  . This patient presents  to the office today for  foot evaluation due to history of  diabetes. He has history of vascular surgery right leg.  General Appearance  Alert, conversant and in no acute stress.  Vascular  Dorsalis pedis and posterior tibial  pulses are not  palpable  bilaterally.  Capillary return is within normal limits  bilaterally. Temperature is within normal limits  bilaterally.  Neurologic  Senn-Weinstein monofilament wire test diminished/absent  bilaterally. Muscle power within normal limits bilaterally.  Nails Thick disfigured discolored nails with subungual debris  from hallux to fifth toes bilaterally. No evidence of bacterial infection or drainage bilaterally.  Orthopedic  No limitations of motion of motion feet .  No crepitus or effusions noted.  No bony pathology or digital deformities noted.  Skin  normotropic skin with no porokeratosis noted bilaterally.  No signs of infections or ulcers noted.     Diabetes with no foot complications   A diabetic foot exam was performed and there is evidence of vascular or neurologic pathology.   RTC 1 year.   Nails were done as a courtesy.SABRA Cordella Bold DPM

## 2024-06-08 ENCOUNTER — Encounter (HOSPITAL_BASED_OUTPATIENT_CLINIC_OR_DEPARTMENT_OTHER): Payer: Self-pay

## 2024-06-08 ENCOUNTER — Ambulatory Visit (HOSPITAL_BASED_OUTPATIENT_CLINIC_OR_DEPARTMENT_OTHER): Admitting: Physical Therapy

## 2024-06-08 ENCOUNTER — Telehealth (HOSPITAL_BASED_OUTPATIENT_CLINIC_OR_DEPARTMENT_OTHER): Payer: Self-pay | Admitting: Physical Therapy

## 2024-06-08 NOTE — Telephone Encounter (Signed)
 Spoke with patient regarding upcoming PT appointment.  The patient had been admitted to the hospital and vascular issues.  He continues to report intermittent sharp pains.  He has a follow-up with the vascular doctor on November 11.  He has not obtained clearance from his vascular doctor regarding exercise.  Therapy advised him he will need clearance from his vascular doctor.  He is advised to ask them when he sees him on 11th.

## 2024-06-10 ENCOUNTER — Encounter (HOSPITAL_BASED_OUTPATIENT_CLINIC_OR_DEPARTMENT_OTHER): Admitting: Physical Therapy

## 2024-06-14 ENCOUNTER — Encounter: Admitting: Vascular Surgery

## 2024-06-17 DIAGNOSIS — E1142 Type 2 diabetes mellitus with diabetic polyneuropathy: Secondary | ICD-10-CM | POA: Diagnosis not present

## 2024-06-21 ENCOUNTER — Ambulatory Visit: Attending: Vascular Surgery | Admitting: Vascular Surgery

## 2024-06-21 ENCOUNTER — Encounter: Payer: Self-pay | Admitting: Vascular Surgery

## 2024-06-21 VITALS — BP 131/54 | HR 72 | Temp 97.9°F | Ht 71.0 in | Wt 211.0 lb

## 2024-06-21 DIAGNOSIS — I739 Peripheral vascular disease, unspecified: Secondary | ICD-10-CM

## 2024-06-21 NOTE — Progress Notes (Unsigned)
 [facility]  ASSESSMENT / PLAN: 78 y.o. male with atherosclerosis of the right lower extremity with claudication.  He was recently admitted to the hospital and underwent angiography for severe pain which was worrisome for acute limb ischemia.  He had slow improvement over the course of his hospital admission with IV heparin .  He was ultimately found to have nonlimb-threatening ischemia.  His symptoms are most consistent with claudication today.  I counseled him about the fairly benign nature of this diagnosis and strongly encouraged him to avoid intervention if at all possible.  Intervention would require common femoral artery to posterior tibial artery bypass with PTFE.  I counseled about the expected durability of 6 to 12 months for this type of bypass and encouraged him to reserve this should he develop limb-threatening ischemia.  He verbalized good understanding.  I will see him again in 6 months with repeat noninvasive testing.  CHIEF COMPLAINT: right leg pain with walking  HISTORY OF PRESENT ILLNESS: Bradley [name] is a 78 y.o. male well-known to me for whom I have seen in the past for peripheral arterial disease after my partner, Dr. Oris, retired.  He was admitted to the hospital recently for severe right leg pain worrisome for ischemic limb.  He underwent angiography which revealed long level occlusion with reconstitution of the posterior tibial artery at the calf.  He improved during his admission and because he was not in a limb-threatening position, close outpatient follow-up was recommended.  During evaluation, he affirms that he is cramping with walking only.  He has no rest pain symptoms.  He has no ulcers about his feet.  I counseled him about the difficult situation in which he is in.  I counseled him that our only remaining option is a common femoral to posterior tibial artery bypass with PTFE, which I quoted him a durability of 6 to 12 months.   Understandably, he was not interested in this therapy and preferred medical approach, which I think is most prudent.   Past Medical History:  Diagnosis Date   Acquired equinus deformity of foot 12/15/2011   Anemia    Arthritis    Carotid artery occlusion    a. Carotid US  10/16: RICA 60-79%, L CEA patent with 1-39% stenosis   CKD (chronic kidney disease)    stage 3-4   Coagulation disorder 05/25/2019   Coronary artery disease    a. Myoview  9/16:  EF 52%, inferior fixed defect consistent with diaphragmatic attenuation, intermediate risk secondary to poor exercise tolerance and symptoms during stress;  b. LHC 05/17/2015 90% mid LAD, 80% ost D2, 75% mid RCA, 35% prox RCA. >> S/p CABG   Coronary artery disease involving coronary bypass graft of native heart with angina pectoris    CORONARY ARTERY BYPASS GRAFTING x 3 - 05/23/2015 Left internal mammary artery to left anterior descending  Saphenous vein graft to diagonal  Saphenous vein graft to posterior descending Endoscopic greater saphenous vein harvest right thigh     Developmental delay    Diabetes mellitus age 57   Diverticulitis    Diverticulosis of colon without hemorrhage 05/17/2015   By CT scan    DM type 2, controlled, with complication ([name]) 04/29/2015   Essential hypertension 04/29/2015   GERD (gastroesophageal reflux disease)    H/O hiatal hernia    History of echocardiogram    a. Echo 9/16: GLS -15.2%, EF 55-60%, grade 1 diastolic dysfunction, normal wall motion, aortic sclerosis, dilated aortic root 39  mm, atrial septal lipomatous hypertrophy   Hyperlipidemia    Hypertension    Irritable bowel syndrome (IBS)    Myocardial infarction ([name])    silent inferior MI; patient denies MI history (03/17/13)    Neuralgia    Neuropathy 2013   OSA on CPAP 05/30/2015   Osteoporosis 2013   PAF (paroxysmal atrial fibrillation) ([name])    post CABG 2016; Amiodarone  stopped 2/2 wheezing; PAF 01/12/20   Peripheral vascular disease    Plantar  fasciitis 12/15/2011   Pneumonia    Reflux esophagitis    Right bundle branch block 04/30/2015   Sleep apnea    uses CPAP   Stenosis of right internal carotid artery with cerebral infarction ([name]) 12/09/2011   Stress fracture of metatarsal bone 02/18/2012   Stroke ([name]) 1987   Right brain stroke- slight drop on left side   Syncope 01/12/2020   Type 2 diabetes mellitus with vascular disease ([name]) 05/30/2015   Wears glasses     Past Surgical History:  Procedure Laterality Date   ABDOMINAL AORTOGRAM W/LOWER EXTREMITY Bilateral 11/27/2020   Procedure: ABDOMINAL AORTOGRAM W/LOWER EXTREMITY;  Surgeon: Serene Gaile ORN, MD;  Location: [facility];  Service: Cardiovascular;  Laterality: Bilateral;   ANTERIOR CERVICAL DECOMP/DISCECTOMY FUSION N/A 08/25/2023   Procedure: CERVICAL THREE-FOUR, CERVICAL FOUR-FIVE, CERVICAL FIVE-SIX ANTERIOR CERVICAL DECOMPRESSION/DISCECTOMY FUSION;  Surgeon: Debby Dorn MATSU, MD;  Location: [facility] OR;  Service: Neurosurgery;  Laterality: N/A;   CARDIAC CATHETERIZATION N/A 05/17/2015   Procedure: Left Heart Cath and Coronary Angiography;  Surgeon: Victory ORN Sharps, MD;  Location: [facility] INVASIVE CV LAB;  Service: Cardiovascular;  Laterality: N/A;   CAROTID ENDARTERECTOMY  1994 & redo 2001   Left   CATARACT EXTRACTION W/ INTRAOCULAR LENS IMPLANT Bilateral    COLONOSCOPY W/ BIOPSIES AND POLYPECTOMY     CORONARY ARTERY BYPASS GRAFT N/A 05/23/2015   Procedure: CORONARY ARTERY BYPASS GRAFTING (CABG) x 3 (LIMA to LAD, SVG to DIAGONAL 2, SVG to PDA) with Endoscopic Vein Harvesting from right greater saphenous vein;  Surgeon: Dallas KATHEE Jude, MD;  Location: [facility] OR;  Service: Open Heart Surgery;  Laterality: N/A;   ENDARTERECTOMY FEMORAL Right 11/30/2020   Procedure: ENDARTERECTOMY FEMORAL RIGHT;  Surgeon: Oris Krystal FALCON, MD;  Location: [facility] OR;  Service: Vascular;  Laterality: Right;   FEMORAL-POPLITEAL BYPASS GRAFT Right 11/30/2020   Procedure: BYPASS GRAFT FEMORAL-POPLITEAL  ARTERY RIGHT;  Surgeon: Oris Krystal FALCON, MD;  Location: [facility] OR;  Service: Vascular;  Laterality: Right;   FRACTURE SURGERY  2013   Right   foo  t X's 2   ILIAC ARTERY STENT     LAPAROSCOPIC CHOLECYSTECTOMY  09/11/2016   LOWER EXTREMITY ANGIOGRAPHY N/A 05/20/2024   Procedure: Lower Extremity Angiography;  Surgeon: Serene Gaile ORN, MD;  Location: [facility];  Service: Cardiovascular;  Laterality: N/A;   POSTERIOR LUMBAR FUSION  03/24/2013   Level 1   PR VEIN BYPASS GRAFT,AORTO-FEM-POP  1998   SPINE SURGERY  2014   TEE WITHOUT CARDIOVERSION N/A 05/23/2015   Procedure: TRANSESOPHAGEAL ECHOCARDIOGRAM (TEE);  Surgeon: Dallas KATHEE Jude, MD;  Location: [facility] OR;  Service: Open Heart Surgery;  Laterality: N/A;   TONSILLECTOMY     TRANSCAROTID ARTERY REVASCULARIZATION  Right 06/01/2020   Procedure: RIGHT TRANSCAROTID ARTERY REVASCULARIZATION;  Surgeon: Serene Gaile ORN, MD;  Location: [facility] OR;  Service: Vascular;  Laterality: Right;   TRIGGER FINGER RELEASE Right 07/14/2017   Procedure: RIGHT LONG FINGER TRIGGER RELEASE;  Surgeon: Sebastian Lenis, MD;  Location:  [facility];  Service: Orthopedics;  Laterality: Right;    Family History  Problem Relation Age of Onset   Cancer Mother 55       pancreatic   Hyperlipidemia Mother    Stroke Father 53   Deep vein thrombosis Father    Hyperlipidemia Father    Hypertension Father     Social History   Socioeconomic History   Marital status: Married    Spouse name: Not on file   Number of children: 2   Years of education: Not on file   Highest education level: Not on file  Occupational History   Not on file  Tobacco Use   Smoking status: Former    Current packs/day: 0.00    Average packs/day: 2.0 packs/day for 50.0 years (100.0 ttl pk-yrs)    Types: Cigarettes    Start date: 03/27/1959    Quit date: 03/26/2009    Years since quitting: 15.2   Smokeless tobacco: Never  Vaping Use   Vaping status: Never Used  Substance and  Sexual Activity   Alcohol use: No   Drug use: No   Sexual activity: Yes  Other Topics Concern   Not on file  Social History Narrative   Lives with wife   Caffeine coffee 3 c daily, diet coke   Social Drivers of Corporate Investment Banker Strain: Not on file  Food Insecurity: No Food Insecurity (05/20/2024)   Hunger Vital Sign    Worried About Running Out of Food in the Last Year: Never true    Ran Out of Food in the Last Year: Never true  Transportation Needs: No Transportation Needs (05/20/2024)   PRAPARE - Administrator, Civil Service (Medical): No    Lack of Transportation (Non-Medical): No  Physical Activity: Not on file  Stress: Not on file  Social Connections: Unknown (05/21/2024)   Social Connection and Isolation Panel    Frequency of Communication with Friends and Family: Twice a week    Frequency of Social Gatherings with Friends and Family: Not on file    Attends Religious Services: 1 to 4 times per year    Active Member of [employer] or Organizations: Yes    Attends Engineer, Structural: More than 4 times per year    Marital Status: Married  Catering Manager Violence: Not At Risk (05/20/2024)   Humiliation, Afraid, Rape, and Kick questionnaire    Fear of Current or Ex-Partner: No    Emotionally Abused: No    Physically Abused: No    Sexually Abused: No    Allergies  Allergen Reactions   Doxazosin Mesylate     Other reaction(s): Diarrhea, dizziness   Hydrocodone Nausea Only   Niacin     Other reaction(s): Skin irritation   Oxycodone  Nausea Only   Penicillins Rash    Has patient had a PCN reaction causing immediate rash, facial/tongue/throat swelling, SOB or lightheadedness with hypotension: NO Has patient had a PCN reaction causing severe rash involving mucus membranes or skin necrosis:NO Has patient had a PCN reaction that required hospitalization NO Has patient had a PCN reaction occurring within the last 10 years: NO If all of the  above answers are NO, then may proceed with Cephalosporin use.    Sulfa Drugs Cross Reactors Rash    Current Outpatient Medications  Medication Sig Dispense Refill   amLODipine  (NORVASC ) 10 MG tablet Take 1 tablet (10 mg total) by mouth daily. 90 tablet 3   apixaban  (  ELIQUIS ) 5 MG TABS tablet Take 1 tablet (5 mg total) by mouth 2 (two) times daily. 180 tablet 1   aspirin  EC 81 MG tablet Take 81 mg by mouth daily. Swallow whole.     atorvastatin  (LIPITOR) 40 MG tablet Take 40 mg by mouth daily.     Cholecalciferol  (VITAMIN D3) 125 MCG (5000 UT) TABS Take 5,000 Units by mouth daily.      dexlansoprazole (DEXILANT) 60 MG capsule Take 60 mg by mouth daily.      Dulaglutide  (TRULICITY ) 3 MG/0.5ML SOPN Inject 3 mg into the skin every Sunday.     famotidine  (PEPCID ) 20 MG tablet Take 20 mg by mouth at bedtime.     gabapentin  (NEURONTIN ) 300 MG capsule Take 600 mg by mouth at bedtime.      HUMALOG  KWIKPEN 100 UNIT/ML KwikPen Inject 25-30 Units into the skin 3 (three) times daily.     hydrALAZINE  (APRESOLINE ) 25 MG tablet Take 1 tablet (25 mg total) by mouth in the morning and at bedtime. 180 tablet 2   hydrocortisone  (ANUSOL -HC) 2.5 % rectal cream Place 1 application rectally daily as needed for hemorrhoids.      ipratropium (ATROVENT) 0.03 % nasal spray Place 2 sprays into both nostrils 2 (two) times daily.     ketoconazole  (NIZORAL ) 2 % shampoo Apply 1 application topically daily as needed for irritation.      Melatonin 5 MG CHEW Chew 10 mg by mouth at bedtime.     methocarbamol  (ROBAXIN ) 500 MG tablet Take 500 mg by mouth every 6 (six) hours as needed for muscle spasms.      metoprolol  tartrate (LOPRESSOR ) 50 MG tablet TAKE 1 AND 1/2 TABLETS BY MOUTH TWICE DAILY 270 tablet 0   nitroGLYCERIN  (NITROSTAT ) 0.3 MG SL tablet Place 1 tablet (0.3 mg total) under the tongue every 5 (five) minutes as needed for chest pain. Only as needed 90 tablet 0   testosterone  cypionate (DEPOTESTOSTERONE CYPIONATE)  200 MG/ML injection Inject 200 mg into the muscle once a week.     torsemide  (DEMADEX ) 20 MG tablet Take 1 tablet (20 mg total) by mouth daily. 90 tablet 3   traMADol  (ULTRAM ) 50 MG tablet Take 1-2 tablets (50-100 mg total) by mouth every 6 (six) hours as needed for moderate pain (pain score 4-6) or severe pain (pain score 7-10). 30 tablet 0   TRESIBA  FLEXTOUCH 100 UNIT/ML SOPN FlexTouch Pen Inject 25 Units into the skin daily.     No current facility-administered medications for this visit.    PHYSICAL EXAM Vitals:   06/21/24 1401  BP: (!) 131/54  Pulse: 72  Temp: 97.9 F (36.6 C)  SpO2: 96%  Weight: 211 lb (95.7 kg)  Height: 5' 11 (1.803 m)   Chronically ill elderly man in no distress Regular rate and rhythm Unlabored breathing No palpable pedal pulses   PERTINENT LABORATORY AND RADIOLOGIC DATA  Most recent CBC    Latest Ref Rng & Units 05/21/2024    4:04 AM 05/20/2024    5:41 AM 05/19/2024    2:28 PM  CBC  WBC 4.0 - 10.5 K/uL 6.0  6.7  9.2   Hemoglobin 13.0 - 17.0 g/dL 89.5  89.8  88.9   Hematocrit 39.0 - 52.0 % 34.3  33.5  37.4   Platelets 150 - 400 K/uL 206  213  231      Most recent CMP    Latest Ref Rng & Units 05/21/2024    4:04 AM 05/20/2024  5:41 AM 05/19/2024    8:42 PM  CMP  Glucose 70 - 99 mg/dL 823  787  621   BUN 8 - 23 mg/dL 32  36  45   Creatinine 0.61 - 1.24 mg/dL 7.37  7.15  6.92   Sodium 135 - 145 mmol/L 141  141  137   Potassium 3.5 - 5.1 mmol/L 3.2  3.1  3.7   Chloride 98 - 111 mmol/L 106  101  100   CO2 22 - 32 mmol/L 26  24  25    Calcium  8.9 - 10.3 mg/dL 8.7  8.6  8.7   Total Protein 6.5 - 8.1 g/dL  6.1  6.4   Total Bilirubin 0.0 - 1.2 mg/dL  0.9  0.8   Alkaline Phos 38 - 126 U/L  58  66   AST 15 - 41 U/L  <10  13   ALT 0 - 44 U/L  12  12     Renal function CrCl cannot be calculated (Patient's most recent lab result is older than the maximum 21 days allowed.).  Hgb A1c MFr Bld (%)  Date Value  05/19/2024 9.1 (H)    LDL  Cholesterol  Date Value Ref Range Status  05/21/2024 33 0 - 99 mg/dL Final    Comment:           Total Cholesterol/HDL:CHD Risk Coronary Heart Disease Risk Table                     Men   Women  1/2 Average Risk   3.4   3.3  Average Risk       5.0   4.4  2 X Average Risk   9.6   7.1  3 X Average Risk  23.4   11.0        Use the calculated Patient Ratio above and the CHD Risk Table to determine the patient's CHD Risk.        ATP III CLASSIFICATION (LDL):  <100     mg/dL   Optimal  899-870  mg/dL   Near or Above                    Optimal  130-159  mg/dL   Borderline  839-810  mg/dL   High  >809     mg/dL   Very High Performed at [facility], [address], [place], [place] [address]     Debby SAILOR. Magda, MD [facility] Phone Number: [phone] 06/22/2024 4:57 PM   Total time spent on preparing this encounter including chart review, data review, collecting history, examining the patient, and coordinating care: 40 min  Portions of this report may have been transcribed using voice recognition software.  Every effort has been made to ensure accuracy; however, inadvertent computerized transcription errors may still be present.

## 2024-06-22 DIAGNOSIS — E1122 Type 2 diabetes mellitus with diabetic chronic kidney disease: Secondary | ICD-10-CM | POA: Diagnosis not present

## 2024-06-22 DIAGNOSIS — E871 Hypo-osmolality and hyponatremia: Secondary | ICD-10-CM | POA: Diagnosis not present

## 2024-06-22 DIAGNOSIS — I639 Cerebral infarction, unspecified: Secondary | ICD-10-CM | POA: Diagnosis not present

## 2024-06-22 DIAGNOSIS — R809 Proteinuria, unspecified: Secondary | ICD-10-CM | POA: Diagnosis not present

## 2024-06-22 DIAGNOSIS — N184 Chronic kidney disease, stage 4 (severe): Secondary | ICD-10-CM | POA: Diagnosis not present

## 2024-06-22 DIAGNOSIS — N179 Acute kidney failure, unspecified: Secondary | ICD-10-CM | POA: Diagnosis not present

## 2024-06-22 DIAGNOSIS — Z87898 Personal history of other specified conditions: Secondary | ICD-10-CM | POA: Diagnosis not present

## 2024-06-22 DIAGNOSIS — I129 Hypertensive chronic kidney disease with stage 1 through stage 4 chronic kidney disease, or unspecified chronic kidney disease: Secondary | ICD-10-CM | POA: Diagnosis not present

## 2024-06-22 DIAGNOSIS — I2510 Atherosclerotic heart disease of native coronary artery without angina pectoris: Secondary | ICD-10-CM | POA: Diagnosis not present

## 2024-06-22 DIAGNOSIS — I6529 Occlusion and stenosis of unspecified carotid artery: Secondary | ICD-10-CM | POA: Diagnosis not present

## 2024-06-22 DIAGNOSIS — E785 Hyperlipidemia, unspecified: Secondary | ICD-10-CM | POA: Diagnosis not present

## 2024-06-23 ENCOUNTER — Other Ambulatory Visit: Payer: Self-pay | Admitting: *Deleted

## 2024-06-23 DIAGNOSIS — I6523 Occlusion and stenosis of bilateral carotid arteries: Secondary | ICD-10-CM

## 2024-06-23 DIAGNOSIS — I739 Peripheral vascular disease, unspecified: Secondary | ICD-10-CM

## 2024-06-24 DIAGNOSIS — L2989 Other pruritus: Secondary | ICD-10-CM | POA: Diagnosis not present

## 2024-06-24 DIAGNOSIS — Z85828 Personal history of other malignant neoplasm of skin: Secondary | ICD-10-CM | POA: Diagnosis not present

## 2024-06-24 DIAGNOSIS — D2261 Melanocytic nevi of right upper limb, including shoulder: Secondary | ICD-10-CM | POA: Diagnosis not present

## 2024-06-24 DIAGNOSIS — D225 Melanocytic nevi of trunk: Secondary | ICD-10-CM | POA: Diagnosis not present

## 2024-06-24 DIAGNOSIS — D2262 Melanocytic nevi of left upper limb, including shoulder: Secondary | ICD-10-CM | POA: Diagnosis not present

## 2024-06-24 DIAGNOSIS — Z85820 Personal history of malignant melanoma of skin: Secondary | ICD-10-CM | POA: Diagnosis not present

## 2024-06-24 DIAGNOSIS — L570 Actinic keratosis: Secondary | ICD-10-CM | POA: Diagnosis not present

## 2024-06-24 DIAGNOSIS — Z86006 Personal history of melanoma in-situ: Secondary | ICD-10-CM | POA: Diagnosis not present

## 2024-06-28 ENCOUNTER — Ambulatory Visit (HOSPITAL_COMMUNITY)

## 2024-06-28 ENCOUNTER — Ambulatory Visit

## 2024-07-04 DIAGNOSIS — E7800 Pure hypercholesterolemia, unspecified: Secondary | ICD-10-CM | POA: Diagnosis not present

## 2024-07-04 DIAGNOSIS — Z79899 Other long term (current) drug therapy: Secondary | ICD-10-CM | POA: Diagnosis not present

## 2024-07-04 DIAGNOSIS — G629 Polyneuropathy, unspecified: Secondary | ICD-10-CM | POA: Diagnosis not present

## 2024-07-04 DIAGNOSIS — Z0000 Encounter for general adult medical examination without abnormal findings: Secondary | ICD-10-CM | POA: Diagnosis not present

## 2024-07-04 DIAGNOSIS — Z23 Encounter for immunization: Secondary | ICD-10-CM | POA: Diagnosis not present

## 2024-07-04 DIAGNOSIS — E1142 Type 2 diabetes mellitus with diabetic polyneuropathy: Secondary | ICD-10-CM | POA: Diagnosis not present

## 2024-07-04 DIAGNOSIS — Z6829 Body mass index (BMI) 29.0-29.9, adult: Secondary | ICD-10-CM | POA: Diagnosis not present

## 2024-07-04 DIAGNOSIS — I779 Disorder of arteries and arterioles, unspecified: Secondary | ICD-10-CM | POA: Diagnosis not present

## 2024-07-04 DIAGNOSIS — E291 Testicular hypofunction: Secondary | ICD-10-CM | POA: Diagnosis not present

## 2024-07-04 DIAGNOSIS — I2510 Atherosclerotic heart disease of native coronary artery without angina pectoris: Secondary | ICD-10-CM | POA: Diagnosis not present

## 2024-07-04 DIAGNOSIS — I10 Essential (primary) hypertension: Secondary | ICD-10-CM | POA: Diagnosis not present

## 2024-07-04 DIAGNOSIS — Z125 Encounter for screening for malignant neoplasm of prostate: Secondary | ICD-10-CM | POA: Diagnosis not present

## 2024-07-04 DIAGNOSIS — N184 Chronic kidney disease, stage 4 (severe): Secondary | ICD-10-CM | POA: Diagnosis not present

## 2024-07-04 DIAGNOSIS — I739 Peripheral vascular disease, unspecified: Secondary | ICD-10-CM | POA: Diagnosis not present

## 2024-07-04 DIAGNOSIS — Z794 Long term (current) use of insulin: Secondary | ICD-10-CM | POA: Diagnosis not present

## 2024-07-06 DIAGNOSIS — Z794 Long term (current) use of insulin: Secondary | ICD-10-CM | POA: Diagnosis not present

## 2024-07-06 DIAGNOSIS — I10 Essential (primary) hypertension: Secondary | ICD-10-CM | POA: Diagnosis not present

## 2024-07-06 DIAGNOSIS — E291 Testicular hypofunction: Secondary | ICD-10-CM | POA: Diagnosis not present

## 2024-07-06 DIAGNOSIS — E1142 Type 2 diabetes mellitus with diabetic polyneuropathy: Secondary | ICD-10-CM | POA: Diagnosis not present

## 2024-07-06 DIAGNOSIS — E041 Nontoxic single thyroid nodule: Secondary | ICD-10-CM | POA: Diagnosis not present

## 2024-07-18 DIAGNOSIS — E1142 Type 2 diabetes mellitus with diabetic polyneuropathy: Secondary | ICD-10-CM | POA: Diagnosis not present

## 2024-08-17 ENCOUNTER — Encounter: Payer: Self-pay | Admitting: Family Medicine

## 2024-08-17 ENCOUNTER — Ambulatory Visit (INDEPENDENT_AMBULATORY_CARE_PROVIDER_SITE_OTHER): Payer: Self-pay | Admitting: Family Medicine

## 2024-08-17 VITALS — BP 118/60 | Temp 97.5°F | Ht 71.26 in | Wt 211.6 lb

## 2024-08-17 DIAGNOSIS — I480 Paroxysmal atrial fibrillation: Secondary | ICD-10-CM

## 2024-08-17 DIAGNOSIS — E785 Hyperlipidemia, unspecified: Secondary | ICD-10-CM

## 2024-08-17 DIAGNOSIS — M65341 Trigger finger, right ring finger: Secondary | ICD-10-CM

## 2024-08-17 DIAGNOSIS — I1310 Hypertensive heart and chronic kidney disease without heart failure, with stage 1 through stage 4 chronic kidney disease, or unspecified chronic kidney disease: Secondary | ICD-10-CM

## 2024-08-17 DIAGNOSIS — Z794 Long term (current) use of insulin: Secondary | ICD-10-CM | POA: Diagnosis not present

## 2024-08-17 DIAGNOSIS — I25709 Atherosclerosis of coronary artery bypass graft(s), unspecified, with unspecified angina pectoris: Secondary | ICD-10-CM | POA: Diagnosis not present

## 2024-08-17 DIAGNOSIS — Z79890 Hormone replacement therapy (postmenopausal): Secondary | ICD-10-CM | POA: Diagnosis not present

## 2024-08-17 DIAGNOSIS — Z5181 Encounter for therapeutic drug level monitoring: Secondary | ICD-10-CM

## 2024-08-17 DIAGNOSIS — E1169 Type 2 diabetes mellitus with other specified complication: Secondary | ICD-10-CM

## 2024-08-17 DIAGNOSIS — Z87891 Personal history of nicotine dependence: Secondary | ICD-10-CM

## 2024-08-17 DIAGNOSIS — I63231 Cerebral infarction due to unspecified occlusion or stenosis of right carotid arteries: Secondary | ICD-10-CM

## 2024-08-17 DIAGNOSIS — N184 Chronic kidney disease, stage 4 (severe): Secondary | ICD-10-CM

## 2024-08-17 DIAGNOSIS — D631 Anemia in chronic kidney disease: Secondary | ICD-10-CM

## 2024-08-17 DIAGNOSIS — K219 Gastro-esophageal reflux disease without esophagitis: Secondary | ICD-10-CM

## 2024-08-17 DIAGNOSIS — G4733 Obstructive sleep apnea (adult) (pediatric): Secondary | ICD-10-CM

## 2024-08-17 DIAGNOSIS — Z9889 Other specified postprocedural states: Secondary | ICD-10-CM

## 2024-08-17 DIAGNOSIS — E1159 Type 2 diabetes mellitus with other circulatory complications: Secondary | ICD-10-CM

## 2024-08-17 DIAGNOSIS — E1122 Type 2 diabetes mellitus with diabetic chronic kidney disease: Secondary | ICD-10-CM | POA: Diagnosis not present

## 2024-08-17 DIAGNOSIS — I739 Peripheral vascular disease, unspecified: Secondary | ICD-10-CM

## 2024-08-17 DIAGNOSIS — D6869 Other thrombophilia: Secondary | ICD-10-CM | POA: Diagnosis not present

## 2024-08-17 DIAGNOSIS — E1142 Type 2 diabetes mellitus with diabetic polyneuropathy: Secondary | ICD-10-CM

## 2024-08-17 DIAGNOSIS — I10 Essential (primary) hypertension: Secondary | ICD-10-CM

## 2024-08-17 DIAGNOSIS — Z8673 Personal history of transient ischemic attack (TIA), and cerebral infarction without residual deficits: Secondary | ICD-10-CM

## 2024-08-17 NOTE — Patient Instructions (Addendum)
 Sign release up front for records from Dr Okey at [place] including preventative healthcare (colonoscopy results from 2017) and latest labs.  Schedule follow up visit with Dr Valencia Richard to meet you today  Return in 3-4 months for follow up visit

## 2024-08-17 NOTE — Progress Notes (Signed)
 " Ph: [phone] Fax: [phone]   Patient ID: DEMARCO BACCI, male    DOB: 11-06-1945, 79 y.o.   MRN: [id number]  This visit was conducted in person.  BP 118/60 (Cuff Size: Normal)   Temp (!) 97.5 F (36.4 C) (Oral)   Ht 5' 11.26 (1.81 m)   Wt 211 lb 9.6 oz (96 kg)   BMI 29.30 kg/m    CC: new pt to establish  Subjective:   HPI: JERRAL MCCAULEY is a 79 y.o. male presenting on 08/17/2024 for Establish Care (New pt appt, Okey MD retired/)   Previously saw Dr Okey Gwen at [place]) until he recently retired.  Completed AMW 07/04/2024.  Brings CBC from 06/2024: WBC 6.4, Hgb 11.3, plt 228.  Recently seeing hand surgeon Dr Sebastian for trigger finger.   PAD predominant RLE with claudication s/p angiography for severe pain 05/2024, followed by [facility] Dr Hawken q20mo. S/p lower extremity bypass surgery 2022 as well as previous aortobifemoral bypass 1998 (Early). Also sees Dr Serene for carotid stenosis s/p remote L CEA.   H/o PAF on eliquis .   3 level ACDF 08/2023.   OSA on CPAP through [facility]   Known CAD s/p 3v CABG 2016 by Dr Ralph - followed by cardiology  Dr Esmeralda Sharps (retired) --> [place].   Ex smoker - quit 2009  CKD stage 4 with latest GFR 24 (05/2024) - sees nephrology Dr Rayburn (retired) --> Dr Macel. Upcoming appt next month. Discussing dialysis.  Had renal artery ultrasound 08/2021 negative for renal artery stenosis.   HTN - well managed on amlodipine  ,hydralazine , metoprolol ,  and torsemide .   DM - sees endocrinology Dr Faythe at [place] Q3 months. Last A1c 8.9 (06/2024). Does regularly check sugars with Dexcom G6. Compliant with antihyperglycemic regimen which includes: tresiba  25u daily with humalog  20-25 with meals, trulicity  3mg  weekly. Denies low sugars or hypoglycemic symptoms. + neuropathy and angiopathy. Denies retinopathy. Last diabetic eye exam with Dr Fleeta 12/2023. Glucometer brand: CGM. Last foot exam: podiatry 05/2024 Dr Loreda. Lab  Results  Component Value Date   HGBA1C 9.1 (H) 05/19/2024   Diabetic Foot Exam - Simple   No data filed    No results found for: MACKEY CURRENT   Preventative: Colonoscopy 03/16/2016 [facility] GI Dr Rosalie) - records requested     Relevant past medical, surgical, family and social history reviewed and updated as indicated. Interim medical history since our last visit reviewed. Allergies and medications reviewed and updated. Outpatient Medications Prior to Visit  Medication Sig Dispense Refill   amLODipine  (NORVASC ) 10 MG tablet Take 1 tablet (10 mg total) by mouth daily. 90 tablet 3   aspirin  EC 81 MG tablet Take 81 mg by mouth daily. Swallow whole.     atorvastatin  (LIPITOR) 40 MG tablet Take 40 mg by mouth daily.     Cholecalciferol  (VITAMIN D3) 125 MCG (5000 UT) TABS Take 5,000 Units by mouth daily.      Dulaglutide  (TRULICITY ) 3 MG/0.5ML SOPN Inject 3 mg into the skin every Sunday.     famotidine  (PEPCID ) 20 MG tablet Take 20 mg by mouth at bedtime.     gabapentin  (NEURONTIN ) 300 MG capsule Take 600 mg by mouth at bedtime.      hydrALAZINE  (APRESOLINE ) 25 MG tablet Take 1 tablet (25 mg total) by mouth in the morning and at bedtime. 180 tablet 2   hydrocortisone  (ANUSOL -HC) 2.5 % rectal cream Place 1 application rectally daily as needed for hemorrhoids.  ipratropium (ATROVENT) 0.03 % nasal spray Place 2 sprays into both nostrils 2 (two) times daily.     ketoconazole  (NIZORAL ) 2 % shampoo Apply 1 application topically daily as needed for irritation.      Melatonin 5 MG CHEW Chew 10 mg by mouth at bedtime.     methocarbamol  (ROBAXIN ) 500 MG tablet Take 500 mg by mouth every 6 (six) hours as needed for muscle spasms.      metoprolol  tartrate (LOPRESSOR ) 50 MG tablet TAKE 1 AND 1/2 TABLETS BY MOUTH TWICE DAILY 270 tablet 0   nitroGLYCERIN  (NITROSTAT ) 0.3 MG SL tablet Place 1 tablet (0.3 mg total) under the tongue every 5 (five) minutes as needed for chest pain. Only as needed  90 tablet 0   testosterone  cypionate (DEPOTESTOSTERONE CYPIONATE) 200 MG/ML injection Inject 200 mg into the muscle once a week.     torsemide  (DEMADEX ) 20 MG tablet Take 1 tablet (20 mg total) by mouth daily. 90 tablet 3   traMADol  (ULTRAM ) 50 MG tablet Take 1-2 tablets (50-100 mg total) by mouth every 6 (six) hours as needed for moderate pain (pain score 4-6) or severe pain (pain score 7-10). 30 tablet 0   TRESIBA  FLEXTOUCH 100 UNIT/ML SOPN FlexTouch Pen Inject 25 Units into the skin daily.     apixaban  (ELIQUIS ) 5 MG TABS tablet Take 1 tablet (5 mg total) by mouth 2 (two) times daily. 180 tablet 1   dexlansoprazole (DEXILANT) 60 MG capsule Take 1 capsule (60 mg total) by mouth every other day.     dexlansoprazole (DEXILANT) 60 MG capsule Take 1 capsule (60 mg total) by mouth daily.     HUMALOG  KWIKPEN 100 UNIT/ML KwikPen Inject 20-25 Units into the skin 3 (three) times daily.     dexlansoprazole (DEXILANT) 60 MG capsule Take 60 mg by mouth daily.      HUMALOG  KWIKPEN 100 UNIT/ML KwikPen Inject 25-30 Units into the skin 3 (three) times daily.     No facility-administered medications prior to visit.     Per HPI unless specifically indicated in ROS section below Review of Systems  Objective:  BP 118/60 (Cuff Size: Normal)   Temp (!) 97.5 F (36.4 C) (Oral)   Ht 5' 11.26 (1.81 m)   Wt 211 lb 9.6 oz (96 kg)   BMI 29.30 kg/m   Wt Readings from Last 3 Encounters:  08/17/24 211 lb 9.6 oz (96 kg)  06/21/24 211 lb (95.7 kg)  05/19/24 211 lb (95.7 kg)      Physical Exam Vitals and nursing note reviewed.  Constitutional:      Appearance: Normal appearance. He is not ill-appearing.     Comments: Ambulates with rolling walker  HENT:     Head: Normocephalic and atraumatic.     Mouth/Throat:     Mouth: Mucous membranes are moist.     Pharynx: Oropharynx is clear. No oropharyngeal exudate or posterior oropharyngeal erythema.  Eyes:     Extraocular Movements: Extraocular movements  intact.     Conjunctiva/sclera: Conjunctivae normal.     Pupils: Pupils are equal, round, and reactive to light.  Cardiovascular:     Rate and Rhythm: Normal rate and regular rhythm.     Pulses: Normal pulses.     Heart sounds: Normal heart sounds. No murmur heard. Pulmonary:     Effort: Pulmonary effort is normal. No respiratory distress.     Breath sounds: Normal breath sounds. No wheezing, rhonchi or rales.  Musculoskeletal:     Cervical  back: Normal range of motion and neck supple.     Right lower leg: No edema.     Left lower leg: No edema.  Skin:    General: Skin is dry.     Findings: No rash.  Neurological:     Mental Status: He is alert.  Psychiatric:        Mood and Affect: Mood normal.        Behavior: Behavior normal.       Lab Results  Component Value Date   CHOL 97 05/21/2024   HDL 29 (L) 05/21/2024   LDLCALC 33 05/21/2024   TRIG 177 (H) 05/21/2024   CHOLHDL 3.3 05/21/2024    Lab Results  Component Value Date   NA 141 05/21/2024   CL 106 05/21/2024   K 3.2 (L) 05/21/2024   CO2 26 05/21/2024   BUN 32 (H) 05/21/2024   CREATININE 2.62 (H) 05/21/2024   GFRNONAA 24 (L) 05/21/2024   CALCIUM  8.7 (L) 05/21/2024   PHOS 4.0 05/20/2024   ALBUMIN  3.3 (L) 05/20/2024   GLUCOSE 176 (H) 05/21/2024    Lab Results  Component Value Date   ALT 12 05/20/2024   AST <10 (L) 05/20/2024   ALKPHOS 58 05/20/2024   BILITOT 0.9 05/20/2024    Lab Results  Component Value Date   WBC 6.0 05/21/2024   HGB 10.4 (L) 05/21/2024   HCT 34.3 (L) 05/21/2024   MCV 85.5 05/21/2024   PLT 206 05/21/2024     Assessment & Plan:   Problem List Items Addressed This Visit     Stenosis of right internal carotid artery with cerebral infarction ([name])   S/p L CEA, continues regularly carotid US  and [facility] f/u       Essential hypertension   Chronic, stable. Continue current regimen. Goal BP <130/80 in CKD stage 4 AKI to jardiance, nephrology held ARB (losartan  hydrochlorothiazide) with  improvement. Will defer to nephrology      Hyperlipidemia associated with type 2 diabetes mellitus ([name])   Continues atorvastatin  40mg  daily.       Relevant Medications   HUMALOG  KWIKPEN 100 UNIT/ML KwikPen   Type 2 diabetes mellitus with other specified complication ([name])   Followed by [facility] endocrinology Dr Faythe on tresiba , trulicity , humalog .  Uses CGM for monitoring.  Diabetes associated with CAD, PAD, HTN, HLD.       Relevant Medications   HUMALOG  KWIKPEN 100 UNIT/ML KwikPen   Type 2 diabetes mellitus with vascular disease ([name])   Regularly sees [facility]       Relevant Medications   HUMALOG  KWIKPEN 100 UNIT/ML KwikPen   OSA on CPAP   CPAP managed by [facility] sleep physicians.       PAF (paroxysmal atrial fibrillation) ([name])   Continues eliquis  5mg  bid and metoprolol  75mg  bid.  With latest Cr >1.5 and age >100 will need to discuss transitioning to renally dosed eliquis  2.5mg  bid.       Coronary artery disease involving coronary bypass graft of native heart with angina pectoris   Appreciate cardiology care sees Dr Valencia, on aspirin , atorvastatin , eliquis       Hypercoagulable state due to atrial fibrillation ([name])   Continues eliquis  5mg  bid With latest Cr >1.5 and age >58 will need to discuss transitioning to renally dosed eliquis  2.5mg  bid.       History of left-sided carotid endarterectomy   Regularly sees [facility]       PAD (peripheral artery disease)   Regularly sees [facility] (Hawken for PAD  and Brabham for carotid stenosis).  Most recently hospitalized 05/2024 for leg pain concerning for critical limb ischemia, no targets for surgical intervention found.       Type 2 diabetes mellitus with peripheral neuropathy ([name])   Manages with gabapentin  600mg  at night time.       Relevant Medications   HUMALOG  KWIKPEN 100 UNIT/ML KwikPen   CKD stage 4 due to type 2 diabetes mellitus ([name]) - Primary   Chronic issue, latest GFR 24 (05/2024) followed by [facility] Dr Rayburn -->  [place] Discussing option of dialysis AKI to jardiance, nephrology held ARB (losartan  hydrochlorothiazide) with improvement. Will defer to nephrology      Relevant Medications   HUMALOG  KWIKPEN 100 UNIT/ML KwikPen   Anemia   Mild chronic presumed CKD related.       Trigger finger   Seeing hand surgeon Dr Sebastian for this.       Encounter for monitoring testosterone  replacement therapy   No records available to review. Has been on TRT 100mg  weekly for the past 4-5 yrs - started for fatigue.  Discussed tapering off due to cardiovascular risk.  Drop to T 100mg  Q2 wks for the next few months then continue slow taper.  Can reassess status off testosterone  at f/u.       Ex-smoker   Quit 2009       GERD (gastroesophageal reflux disease)   Managed with dexilant and pepcid , has seen [facility] Dr Rosalie.  Records requested.       Relevant Medications   dexlansoprazole (DEXILANT) 60 MG capsule   History of arterial ischemic stroke     No orders of the defined types were placed in this encounter.   No orders of the defined types were placed in this encounter.   Patient Instructions  Sign release up front for records from Dr Okey at [place] including preventative healthcare (colonoscopy results from 2017) and latest labs.  Schedule follow up visit with Dr Valencia Richard to meet you today  Return in 3-4 months for follow up visit   Follow up plan: Return in about 3 months (around 11/15/2024), or if symptoms worsen or fail to improve, for follow up visit.  Anton Blas, MD   "

## 2024-08-20 ENCOUNTER — Telehealth: Payer: Self-pay | Admitting: Family Medicine

## 2024-08-20 ENCOUNTER — Encounter: Payer: Self-pay | Admitting: Family Medicine

## 2024-08-20 DIAGNOSIS — M6530 Trigger finger, unspecified finger: Secondary | ICD-10-CM | POA: Insufficient documentation

## 2024-08-20 DIAGNOSIS — K219 Gastro-esophageal reflux disease without esophagitis: Secondary | ICD-10-CM | POA: Insufficient documentation

## 2024-08-20 DIAGNOSIS — Z87891 Personal history of nicotine dependence: Secondary | ICD-10-CM | POA: Insufficient documentation

## 2024-08-20 DIAGNOSIS — M8580 Other specified disorders of bone density and structure, unspecified site: Secondary | ICD-10-CM | POA: Insufficient documentation

## 2024-08-20 DIAGNOSIS — Z5181 Encounter for therapeutic drug level monitoring: Secondary | ICD-10-CM | POA: Insufficient documentation

## 2024-08-20 DIAGNOSIS — D649 Anemia, unspecified: Secondary | ICD-10-CM | POA: Insufficient documentation

## 2024-08-20 DIAGNOSIS — Z8673 Personal history of transient ischemic attack (TIA), and cerebral infarction without residual deficits: Secondary | ICD-10-CM | POA: Insufficient documentation

## 2024-08-20 MED ORDER — APIXABAN 2.5 MG PO TABS: 2.5000 mg | ORAL_TABLET | Freq: Two times a day (BID) | ORAL | 6 refills | Status: DC

## 2024-08-20 NOTE — Assessment & Plan Note (Addendum)
 S/p L CEA, continues regularly carotid US  and [facility] f/u

## 2024-08-20 NOTE — Assessment & Plan Note (Signed)
 Managed with dexilant and pepcid , has seen [facility] Dr Rosalie.  Records requested.

## 2024-08-20 NOTE — Assessment & Plan Note (Signed)
 CPAP managed by [facility] sleep physicians.

## 2024-08-20 NOTE — Assessment & Plan Note (Signed)
Regularly sees [facility] 

## 2024-08-20 NOTE — Assessment & Plan Note (Addendum)
 Continues eliquis  5mg  bid With latest Cr >1.5 and age >76 will need to discuss transitioning to renally dosed eliquis  2.5mg  bid.

## 2024-08-20 NOTE — Assessment & Plan Note (Signed)
 Appreciate cardiology care sees Dr Valencia, on aspirin , atorvastatin , eliquis 

## 2024-08-20 NOTE — Assessment & Plan Note (Signed)
 Seeing hand surgeon Dr Sebastian for this.

## 2024-08-20 NOTE — Assessment & Plan Note (Addendum)
 Followed by [facility] endocrinology Dr Faythe on tresiba , trulicity , humalog .  Uses CGM for monitoring.  Diabetes associated with CAD, PAD, HTN, HLD.

## 2024-08-20 NOTE — Assessment & Plan Note (Signed)
Continues atorvastatin 40 mg daily.

## 2024-08-20 NOTE — Assessment & Plan Note (Addendum)
 Regularly sees [facility] (Hawken for PAD and Brabham for carotid stenosis).  Most recently hospitalized 05/2024 for leg pain concerning for critical limb ischemia, no targets for surgical intervention found.

## 2024-08-20 NOTE — Assessment & Plan Note (Signed)
Quit 2009

## 2024-08-20 NOTE — Assessment & Plan Note (Signed)
 Continues eliquis  5mg  bid and metoprolol  75mg  bid.  With latest Cr >1.5 and age >49 will need to discuss transitioning to renally dosed eliquis  2.5mg  bid.

## 2024-08-20 NOTE — Assessment & Plan Note (Addendum)
 Chronic, stable. Continue current regimen. Goal BP <130/80 in CKD stage 4 AKI to jardiance, nephrology held ARB (losartan  hydrochlorothiazide) with improvement. Will defer to nephrology

## 2024-08-20 NOTE — Assessment & Plan Note (Signed)
 Manages with gabapentin  600mg  at night time.

## 2024-08-20 NOTE — Assessment & Plan Note (Signed)
 Mild chronic presumed CKD related.

## 2024-08-20 NOTE — Assessment & Plan Note (Addendum)
 Chronic issue, latest GFR 24 (05/2024) followed by [facility] Dr Rayburn --> [place] Discussing option of dialysis AKI to jardiance, nephrology held ARB (losartan  hydrochlorothiazide) with improvement. Will defer to nephrology

## 2024-08-20 NOTE — Assessment & Plan Note (Signed)
 No records available to review. Has been on TRT 100mg  weekly for the past 4-5 yrs - started for fatigue.  Discussed tapering off due to cardiovascular risk.  Drop to T 100mg  Q2 wks for the next few months then continue slow taper.  Can reassess status off testosterone  at f/u.

## 2024-08-20 NOTE — Assessment & Plan Note (Deleted)
 Appreciate cardiology care sees Dr Valencia, on aspirin , atorvastatin , eliquis 

## 2024-08-20 NOTE — Telephone Encounter (Addendum)
 Plz notify - given age >65 and serum creatinine >1.5, do recommend renally dosing eliquis  - I have sent new Rx for 2.5mg  BID to his pharmacy.   Also, would offer getting him set up for shingrix series in office when [facility] pharmacist comes, o/w he may get done at local pharmacy.   Let me know if any questions

## 2024-08-25 NOTE — Telephone Encounter (Unsigned)
 Copied from CRM [id number]. Topic: Clinical - Medication Question >> Aug 25, 2024 12:01 PM Alfonso HERO wrote: Reason for CRM: patient calling to find out why Rx for apixaban  (ELIQUIS ) 2.5 MG TABS tablet was sent to the pharmacy and he also states that he takes 5MG  not 2.5

## 2024-08-25 NOTE — Telephone Encounter (Signed)
 Message from provider given to patient. Patient stats that his kidney doctor tells him what medications he should stop or cut back on and has never said anything about ELIQUIS  . Pt states that he has been on ELIQUIS  for years and only seen Dr. Ludwig once. Pt states he is 79 years old and would like to have a say on how he goes and would rather go out on dialysis than a stroke Pt states he is very comfortable taking his previous ELIQUIS  and would like it changed back, pt states he will make a OV if he needs to. Pt states he does not appreciate his meds being changed without first notified him as he has a lot of doctors to keep up with.   Pt advised that these changed were made with his age and kidney issues in mind and educated on how renal issues affect medication absorption. Pt not happy with explanation and would like call back.  No shingrix series have been scheduled at this time.  Please advise

## 2024-08-26 MED ORDER — APIXABAN 5 MG PO TABS: 5.0000 mg | ORAL_TABLET | Freq: Two times a day (BID) | ORAL | 6 refills | Status: AC

## 2024-08-26 NOTE — Telephone Encounter (Addendum)
 Spoke with patient. Reviewed reasoning behind lower eliquis  dose. He prefers to stay at previous dose - Eliquis  dose changed back to 5mg  bid and sent to pharmacy.   He will discuss with nephrology at next appointment and let me know their recommendations.

## 2024-08-26 NOTE — Addendum Note (Signed)
 Addended by: RILLA BALLER on: 08/26/2024 01:52 PM   Modules accepted: Orders

## 2024-11-16 ENCOUNTER — Ambulatory Visit: Payer: Self-pay | Admitting: Family Medicine

## 2025-01-03 ENCOUNTER — Ambulatory Visit (HOSPITAL_COMMUNITY)

## 2025-01-03 ENCOUNTER — Ambulatory Visit: Admitting: Vascular Surgery

## 2025-06-07 ENCOUNTER — Ambulatory Visit: Admitting: Podiatry
